# Patient Record
Sex: Female | Born: 1960
Health system: Southern US, Community
[De-identification: ages and names within clinical notes are randomized; demographics above are authoritative.]

## PROBLEM LIST (undated history)

## (undated) DIAGNOSIS — C4491 Basal cell carcinoma of skin, unspecified: Secondary | ICD-10-CM

## (undated) DIAGNOSIS — E063 Autoimmune thyroiditis: Secondary | ICD-10-CM

## (undated) DIAGNOSIS — K219 Gastro-esophageal reflux disease without esophagitis: Secondary | ICD-10-CM

## (undated) DIAGNOSIS — A0472 Enterocolitis due to Clostridium difficile, not specified as recurrent: Secondary | ICD-10-CM

## (undated) DIAGNOSIS — M545 Low back pain, unspecified: Secondary | ICD-10-CM

## (undated) DIAGNOSIS — F419 Anxiety disorder, unspecified: Secondary | ICD-10-CM

## (undated) DIAGNOSIS — K589 Irritable bowel syndrome without diarrhea: Secondary | ICD-10-CM

## (undated) DIAGNOSIS — Z8719 Personal history of other diseases of the digestive system: Secondary | ICD-10-CM

## (undated) DIAGNOSIS — B019 Varicella without complication: Secondary | ICD-10-CM

## (undated) DIAGNOSIS — Z0000 Encounter for general adult medical examination without abnormal findings: Secondary | ICD-10-CM

## (undated) DIAGNOSIS — H269 Unspecified cataract: Secondary | ICD-10-CM

## (undated) DIAGNOSIS — E039 Hypothyroidism, unspecified: Secondary | ICD-10-CM

## (undated) DIAGNOSIS — M1990 Unspecified osteoarthritis, unspecified site: Secondary | ICD-10-CM

## (undated) DIAGNOSIS — A4901 Methicillin susceptible Staphylococcus aureus infection, unspecified site: Secondary | ICD-10-CM

## (undated) DIAGNOSIS — K5790 Diverticulosis of intestine, part unspecified, without perforation or abscess without bleeding: Secondary | ICD-10-CM

## (undated) DIAGNOSIS — J019 Acute sinusitis, unspecified: Secondary | ICD-10-CM

## (undated) DIAGNOSIS — F319 Bipolar disorder, unspecified: Secondary | ICD-10-CM

## (undated) DIAGNOSIS — G4730 Sleep apnea, unspecified: Secondary | ICD-10-CM

## (undated) DIAGNOSIS — D649 Anemia, unspecified: Secondary | ICD-10-CM

## (undated) DIAGNOSIS — J189 Pneumonia, unspecified organism: Secondary | ICD-10-CM

## (undated) DIAGNOSIS — K8020 Calculus of gallbladder without cholecystitis without obstruction: Secondary | ICD-10-CM

## (undated) DIAGNOSIS — K635 Polyp of colon: Secondary | ICD-10-CM

## (undated) DIAGNOSIS — R011 Cardiac murmur, unspecified: Secondary | ICD-10-CM

## (undated) DIAGNOSIS — R51 Headache: Principal | ICD-10-CM

## (undated) DIAGNOSIS — H409 Unspecified glaucoma: Secondary | ICD-10-CM

## (undated) DIAGNOSIS — E785 Hyperlipidemia, unspecified: Secondary | ICD-10-CM

## (undated) DIAGNOSIS — Z8489 Family history of other specified conditions: Secondary | ICD-10-CM

## (undated) DIAGNOSIS — E669 Obesity, unspecified: Secondary | ICD-10-CM

## (undated) DIAGNOSIS — M25561 Pain in right knee: Secondary | ICD-10-CM

## (undated) DIAGNOSIS — N951 Menopausal and female climacteric states: Secondary | ICD-10-CM

## (undated) DIAGNOSIS — K279 Peptic ulcer, site unspecified, unspecified as acute or chronic, without hemorrhage or perforation: Secondary | ICD-10-CM

## (undated) DIAGNOSIS — Q134 Other congenital corneal malformations: Secondary | ICD-10-CM

## (undated) DIAGNOSIS — D126 Benign neoplasm of colon, unspecified: Secondary | ICD-10-CM

## (undated) DIAGNOSIS — Z720 Tobacco use: Secondary | ICD-10-CM

## (undated) DIAGNOSIS — L508 Other urticaria: Secondary | ICD-10-CM

## (undated) DIAGNOSIS — I10 Essential (primary) hypertension: Secondary | ICD-10-CM

## (undated) DIAGNOSIS — F329 Major depressive disorder, single episode, unspecified: Secondary | ICD-10-CM

## (undated) DIAGNOSIS — H35373 Puckering of macula, bilateral: Secondary | ICD-10-CM

## (undated) DIAGNOSIS — M9270 Juvenile osteochondrosis of metatarsus, unspecified foot: Secondary | ICD-10-CM

## (undated) DIAGNOSIS — J439 Emphysema, unspecified: Secondary | ICD-10-CM

## (undated) DIAGNOSIS — B269 Mumps without complication: Secondary | ICD-10-CM

## (undated) HISTORY — DX: Varicella without complication: B01.9

## (undated) HISTORY — DX: Unspecified cataract: H26.9

## (undated) HISTORY — DX: Unspecified glaucoma: H40.9

## (undated) HISTORY — DX: Menopausal and female climacteric states: N95.1

## (undated) HISTORY — DX: Juvenile osteochondrosis of metatarsus, unspecified foot: M92.70

## (undated) HISTORY — DX: Peptic ulcer, site unspecified, unspecified as acute or chronic, without hemorrhage or perforation: K27.9

## (undated) HISTORY — DX: Acute sinusitis, unspecified: J01.90

## (undated) HISTORY — DX: Major depressive disorder, single episode, unspecified: F32.9

## (undated) HISTORY — DX: Essential (primary) hypertension: I10

## (undated) HISTORY — DX: Methicillin susceptible Staphylococcus aureus infection, unspecified site: A49.01

## (undated) HISTORY — DX: Puckering of macula, bilateral: H35.373

## (undated) HISTORY — PX: ABDOMINAL HYSTERECTOMY: SUR658

## (undated) HISTORY — DX: Other congenital corneal malformations: Q13.4

## (undated) HISTORY — DX: Other urticaria: L50.8

## (undated) HISTORY — DX: Mumps without complication: B26.9

## (undated) HISTORY — DX: Bipolar disorder, unspecified: F31.9

## (undated) HISTORY — DX: Hyperlipidemia, unspecified: E78.5

## (undated) HISTORY — DX: Anemia, unspecified: D64.9

## (undated) HISTORY — DX: Tobacco use: Z72.0

## (undated) HISTORY — DX: Basal cell carcinoma of skin, unspecified: C44.91

## (undated) HISTORY — DX: Sleep apnea, unspecified: G47.30

## (undated) HISTORY — DX: Irritable bowel syndrome without diarrhea: K58.9

## (undated) HISTORY — PX: UTERINE SUSPENSION: SUR1430

## (undated) HISTORY — DX: Cardiac murmur, unspecified: R01.1

## (undated) HISTORY — DX: Headache: R51

## (undated) HISTORY — DX: Anxiety disorder, unspecified: F41.9

## (undated) HISTORY — PX: POLYPECTOMY: SHX149

## (undated) HISTORY — DX: Polyp of colon: K63.5

## (undated) HISTORY — DX: Emphysema, unspecified: J43.9

## (undated) HISTORY — DX: Encounter for general adult medical examination without abnormal findings: Z00.00

## (undated) HISTORY — DX: Low back pain: M54.5

## (undated) HISTORY — DX: Hypothyroidism, unspecified: E03.9

## (undated) HISTORY — DX: Pain in right knee: M25.561

## (undated) HISTORY — PX: COLONOSCOPY: SHX174

## (undated) HISTORY — DX: Benign neoplasm of colon, unspecified: D12.6

## (undated) HISTORY — DX: Low back pain, unspecified: M54.50

## (undated) HISTORY — DX: Gastro-esophageal reflux disease without esophagitis: K21.9

## (undated) HISTORY — DX: Unspecified osteoarthritis, unspecified site: M19.90

## (undated) HISTORY — DX: Obesity, unspecified: E66.9

## (undated) HISTORY — DX: Enterocolitis due to Clostridium difficile, not specified as recurrent: A04.72

## (undated) HISTORY — DX: Diverticulosis of intestine, part unspecified, without perforation or abscess without bleeding: K57.90

---

## 1983-01-16 HISTORY — PX: DILATION AND CURETTAGE OF UTERUS: SHX78

## 1993-01-15 HISTORY — PX: TUBAL LIGATION: SHX77

## 1997-07-30 ENCOUNTER — Other Ambulatory Visit: Admission: RE | Admit: 1997-07-30 | Discharge: 1997-07-30 | Payer: Self-pay | Admitting: *Deleted

## 1998-03-16 ENCOUNTER — Other Ambulatory Visit: Admission: RE | Admit: 1998-03-16 | Discharge: 1998-03-16 | Payer: Self-pay | Admitting: Obstetrics & Gynecology

## 1998-12-02 ENCOUNTER — Other Ambulatory Visit: Admission: RE | Admit: 1998-12-02 | Discharge: 1998-12-02 | Payer: Self-pay | Admitting: Obstetrics and Gynecology

## 2000-01-23 ENCOUNTER — Other Ambulatory Visit: Admission: RE | Admit: 2000-01-23 | Discharge: 2000-01-23 | Payer: Self-pay | Admitting: Obstetrics and Gynecology

## 2000-01-30 ENCOUNTER — Encounter: Admission: RE | Admit: 2000-01-30 | Discharge: 2000-01-30 | Payer: Self-pay | Admitting: Obstetrics and Gynecology

## 2000-01-30 ENCOUNTER — Encounter: Payer: Self-pay | Admitting: Obstetrics and Gynecology

## 2000-05-01 ENCOUNTER — Encounter: Admission: RE | Admit: 2000-05-01 | Discharge: 2000-07-30 | Payer: Self-pay | Admitting: Internal Medicine

## 2000-10-29 ENCOUNTER — Ambulatory Visit (HOSPITAL_COMMUNITY): Admission: RE | Admit: 2000-10-29 | Discharge: 2000-10-29 | Payer: Self-pay | Admitting: Internal Medicine

## 2000-10-29 ENCOUNTER — Encounter: Payer: Self-pay | Admitting: Internal Medicine

## 2000-12-26 ENCOUNTER — Other Ambulatory Visit: Admission: RE | Admit: 2000-12-26 | Discharge: 2000-12-26 | Payer: Self-pay | Admitting: Obstetrics and Gynecology

## 2001-02-10 ENCOUNTER — Encounter: Payer: Self-pay | Admitting: Obstetrics and Gynecology

## 2001-02-10 ENCOUNTER — Ambulatory Visit (HOSPITAL_COMMUNITY): Admission: RE | Admit: 2001-02-10 | Discharge: 2001-02-10 | Payer: Self-pay | Admitting: Obstetrics and Gynecology

## 2001-06-03 ENCOUNTER — Encounter: Payer: Self-pay | Admitting: Internal Medicine

## 2001-11-05 HISTORY — PX: OTHER SURGICAL HISTORY: SHX169

## 2001-12-29 ENCOUNTER — Other Ambulatory Visit: Admission: RE | Admit: 2001-12-29 | Discharge: 2001-12-29 | Payer: Self-pay | Admitting: Obstetrics and Gynecology

## 2003-12-08 ENCOUNTER — Ambulatory Visit: Payer: Self-pay | Admitting: Endocrinology

## 2003-12-28 ENCOUNTER — Other Ambulatory Visit: Admission: RE | Admit: 2003-12-28 | Discharge: 2003-12-28 | Payer: Self-pay | Admitting: Obstetrics and Gynecology

## 2004-01-24 ENCOUNTER — Ambulatory Visit: Payer: Self-pay | Admitting: Endocrinology

## 2004-01-28 ENCOUNTER — Ambulatory Visit: Payer: Self-pay | Admitting: Internal Medicine

## 2004-02-11 ENCOUNTER — Ambulatory Visit: Payer: Self-pay | Admitting: Internal Medicine

## 2004-03-31 ENCOUNTER — Ambulatory Visit (HOSPITAL_COMMUNITY): Admission: RE | Admit: 2004-03-31 | Discharge: 2004-03-31 | Payer: Self-pay | Admitting: Obstetrics and Gynecology

## 2004-03-31 ENCOUNTER — Encounter (INDEPENDENT_AMBULATORY_CARE_PROVIDER_SITE_OTHER): Payer: Self-pay | Admitting: *Deleted

## 2004-05-25 ENCOUNTER — Ambulatory Visit: Payer: Self-pay | Admitting: Endocrinology

## 2004-06-15 ENCOUNTER — Ambulatory Visit: Payer: Self-pay | Admitting: Endocrinology

## 2004-07-04 ENCOUNTER — Ambulatory Visit: Payer: Self-pay | Admitting: Endocrinology

## 2004-08-15 ENCOUNTER — Ambulatory Visit (HOSPITAL_BASED_OUTPATIENT_CLINIC_OR_DEPARTMENT_OTHER): Admission: RE | Admit: 2004-08-15 | Discharge: 2004-08-15 | Payer: Self-pay | Admitting: Endocrinology

## 2004-08-30 ENCOUNTER — Ambulatory Visit: Payer: Self-pay | Admitting: Pulmonary Disease

## 2004-09-26 ENCOUNTER — Ambulatory Visit: Payer: Self-pay | Admitting: Pulmonary Disease

## 2004-10-12 ENCOUNTER — Ambulatory Visit: Payer: Self-pay | Admitting: Pulmonary Disease

## 2005-06-27 ENCOUNTER — Ambulatory Visit: Payer: Self-pay | Admitting: Endocrinology

## 2005-07-02 ENCOUNTER — Ambulatory Visit: Payer: Self-pay | Admitting: Endocrinology

## 2005-07-23 ENCOUNTER — Ambulatory Visit: Payer: Self-pay

## 2005-09-04 ENCOUNTER — Ambulatory Visit: Payer: Self-pay | Admitting: Endocrinology

## 2005-09-24 ENCOUNTER — Ambulatory Visit (HOSPITAL_COMMUNITY): Admission: RE | Admit: 2005-09-24 | Discharge: 2005-09-24 | Payer: Self-pay | Admitting: Obstetrics and Gynecology

## 2005-10-03 ENCOUNTER — Encounter: Admission: RE | Admit: 2005-10-03 | Discharge: 2005-10-03 | Payer: Self-pay | Admitting: Obstetrics and Gynecology

## 2005-10-31 ENCOUNTER — Ambulatory Visit: Payer: Self-pay | Admitting: Endocrinology

## 2006-04-08 ENCOUNTER — Encounter: Admission: RE | Admit: 2006-04-08 | Discharge: 2006-04-08 | Payer: Self-pay | Admitting: Neurosurgery

## 2006-07-09 ENCOUNTER — Ambulatory Visit: Payer: Self-pay | Admitting: Endocrinology

## 2006-08-24 ENCOUNTER — Encounter: Payer: Self-pay | Admitting: Endocrinology

## 2006-08-24 DIAGNOSIS — J309 Allergic rhinitis, unspecified: Secondary | ICD-10-CM

## 2006-08-24 DIAGNOSIS — R32 Unspecified urinary incontinence: Secondary | ICD-10-CM | POA: Insufficient documentation

## 2006-08-24 DIAGNOSIS — E039 Hypothyroidism, unspecified: Secondary | ICD-10-CM | POA: Insufficient documentation

## 2006-08-24 DIAGNOSIS — E119 Type 2 diabetes mellitus without complications: Secondary | ICD-10-CM

## 2006-08-24 HISTORY — DX: Hypothyroidism, unspecified: E03.9

## 2006-09-26 ENCOUNTER — Ambulatory Visit: Payer: Self-pay | Admitting: Endocrinology

## 2006-09-26 LAB — CONVERTED CEMR LAB
ALT: 23 units/L (ref 0–35)
AST: 20 units/L (ref 0–37)
Alkaline Phosphatase: 54 units/L (ref 39–117)
BUN: 4 mg/dL — ABNORMAL LOW (ref 6–23)
Basophils Relative: 0.8 % (ref 0.0–1.0)
Bilirubin, Direct: 0.1 mg/dL (ref 0.0–0.3)
CO2: 30 meq/L (ref 19–32)
Calcium: 9 mg/dL (ref 8.4–10.5)
Chloride: 105 meq/L (ref 96–112)
Creatinine, Ser: 0.8 mg/dL (ref 0.4–1.2)
Crystals: NEGATIVE
Eosinophils Absolute: 0.1 10*3/uL (ref 0.0–0.6)
Eosinophils Relative: 1.8 % (ref 0.0–5.0)
Glucose, Bld: 98 mg/dL (ref 70–99)
HCT: 38 % (ref 36.0–46.0)
Ketones, ur: NEGATIVE mg/dL
Lymphocytes Relative: 29.9 % (ref 12.0–46.0)
MCV: 91.2 fL (ref 78.0–100.0)
Neutrophils Relative %: 60 % (ref 43.0–77.0)
RBC: 4.16 M/uL (ref 3.87–5.11)
Total Protein, Urine: NEGATIVE mg/dL
Total Protein: 6.8 g/dL (ref 6.0–8.3)
Triglycerides: 216 mg/dL (ref 0–149)
WBC: 6 10*3/uL (ref 4.5–10.5)

## 2006-10-03 ENCOUNTER — Ambulatory Visit: Payer: Self-pay | Admitting: Endocrinology

## 2006-10-04 ENCOUNTER — Ambulatory Visit: Payer: Self-pay | Admitting: Cardiology

## 2006-10-07 ENCOUNTER — Encounter: Admission: RE | Admit: 2006-10-07 | Discharge: 2006-10-07 | Payer: Self-pay | Admitting: Obstetrics and Gynecology

## 2006-10-11 ENCOUNTER — Ambulatory Visit: Payer: Self-pay

## 2006-10-31 ENCOUNTER — Ambulatory Visit: Payer: Self-pay | Admitting: Endocrinology

## 2006-11-01 ENCOUNTER — Encounter: Payer: Self-pay | Admitting: Endocrinology

## 2006-12-03 ENCOUNTER — Telehealth (INDEPENDENT_AMBULATORY_CARE_PROVIDER_SITE_OTHER): Payer: Self-pay | Admitting: *Deleted

## 2006-12-07 ENCOUNTER — Encounter: Payer: Self-pay | Admitting: Internal Medicine

## 2006-12-09 ENCOUNTER — Telehealth (INDEPENDENT_AMBULATORY_CARE_PROVIDER_SITE_OTHER): Payer: Self-pay | Admitting: *Deleted

## 2006-12-10 ENCOUNTER — Ambulatory Visit: Payer: Self-pay | Admitting: Endocrinology

## 2006-12-11 ENCOUNTER — Encounter: Payer: Self-pay | Admitting: Endocrinology

## 2006-12-13 ENCOUNTER — Ambulatory Visit: Payer: Self-pay | Admitting: Endocrinology

## 2007-01-17 ENCOUNTER — Ambulatory Visit: Payer: Self-pay | Admitting: Endocrinology

## 2007-01-17 ENCOUNTER — Encounter (INDEPENDENT_AMBULATORY_CARE_PROVIDER_SITE_OTHER): Payer: Self-pay | Admitting: *Deleted

## 2007-01-17 DIAGNOSIS — F319 Bipolar disorder, unspecified: Secondary | ICD-10-CM

## 2007-01-17 DIAGNOSIS — F32A Depression, unspecified: Secondary | ICD-10-CM

## 2007-01-17 DIAGNOSIS — R112 Nausea with vomiting, unspecified: Secondary | ICD-10-CM

## 2007-01-17 HISTORY — DX: Bipolar disorder, unspecified: F31.9

## 2007-01-17 HISTORY — DX: Depression, unspecified: F32.A

## 2007-01-20 LAB — CONVERTED CEMR LAB
Alkaline Phosphatase: 55 units/L (ref 39–117)
Amylase: 53 units/L (ref 27–131)
BUN: 2 mg/dL — ABNORMAL LOW (ref 6–23)
Basophils Absolute: 0.3 10*3/uL — ABNORMAL HIGH (ref 0.0–0.1)
Basophils Relative: 4.1 % — ABNORMAL HIGH (ref 0.0–1.0)
CO2: 30 meq/L (ref 19–32)
Creatinine, Ser: 0.6 mg/dL (ref 0.4–1.2)
GFR calc Af Amer: 138 mL/min
HCT: 37.6 % (ref 36.0–46.0)
Hemoglobin: 13.1 g/dL (ref 12.0–15.0)
Lymphocytes Relative: 29.7 % (ref 12.0–46.0)
MCHC: 34.8 g/dL (ref 30.0–36.0)
Monocytes Absolute: 0.5 10*3/uL (ref 0.2–0.7)
Monocytes Relative: 6.6 % (ref 3.0–11.0)
Neutro Abs: 4 10*3/uL (ref 1.4–7.7)
Neutrophils Relative %: 58.4 % (ref 43.0–77.0)
Potassium: 4.1 meq/L (ref 3.5–5.1)
RDW: 12.2 % (ref 11.5–14.6)
Sodium: 139 meq/L (ref 135–145)
TSH: 0.58 microintl units/mL (ref 0.35–5.50)
Total Bilirubin: 0.5 mg/dL (ref 0.3–1.2)
Total Protein: 7 g/dL (ref 6.0–8.3)

## 2007-02-11 ENCOUNTER — Ambulatory Visit: Payer: Self-pay | Admitting: Internal Medicine

## 2007-02-14 ENCOUNTER — Ambulatory Visit (HOSPITAL_COMMUNITY): Admission: RE | Admit: 2007-02-14 | Discharge: 2007-02-14 | Payer: Self-pay | Admitting: Internal Medicine

## 2007-02-26 ENCOUNTER — Emergency Department (HOSPITAL_COMMUNITY): Admission: EM | Admit: 2007-02-26 | Discharge: 2007-02-26 | Payer: Self-pay | Admitting: Emergency Medicine

## 2007-03-05 ENCOUNTER — Encounter: Payer: Self-pay | Admitting: Endocrinology

## 2007-06-30 ENCOUNTER — Encounter: Payer: Self-pay | Admitting: Endocrinology

## 2007-07-27 ENCOUNTER — Emergency Department (HOSPITAL_COMMUNITY): Admission: EM | Admit: 2007-07-27 | Discharge: 2007-07-27 | Payer: Self-pay | Admitting: Family Medicine

## 2007-10-10 ENCOUNTER — Encounter: Admission: RE | Admit: 2007-10-10 | Discharge: 2007-10-10 | Payer: Self-pay | Admitting: Obstetrics and Gynecology

## 2007-10-17 ENCOUNTER — Encounter: Admission: RE | Admit: 2007-10-17 | Discharge: 2007-10-17 | Payer: Self-pay | Admitting: Cardiovascular Disease

## 2007-12-17 ENCOUNTER — Encounter: Payer: Self-pay | Admitting: Endocrinology

## 2008-01-16 LAB — HM COLONOSCOPY

## 2008-06-23 ENCOUNTER — Observation Stay (HOSPITAL_COMMUNITY): Admission: AD | Admit: 2008-06-23 | Discharge: 2008-06-24 | Payer: Self-pay | Admitting: Cardiovascular Disease

## 2008-07-15 ENCOUNTER — Encounter (INDEPENDENT_AMBULATORY_CARE_PROVIDER_SITE_OTHER): Payer: Self-pay | Admitting: *Deleted

## 2008-07-16 ENCOUNTER — Observation Stay (HOSPITAL_COMMUNITY): Admission: EM | Admit: 2008-07-16 | Discharge: 2008-07-16 | Payer: Self-pay | Admitting: Emergency Medicine

## 2008-07-16 ENCOUNTER — Encounter: Payer: Self-pay | Admitting: Internal Medicine

## 2008-07-27 ENCOUNTER — Ambulatory Visit: Payer: Self-pay | Admitting: Internal Medicine

## 2008-07-27 DIAGNOSIS — R079 Chest pain, unspecified: Secondary | ICD-10-CM

## 2008-07-27 DIAGNOSIS — Z860100 Personal history of colon polyps, unspecified: Secondary | ICD-10-CM | POA: Insufficient documentation

## 2008-07-27 DIAGNOSIS — K219 Gastro-esophageal reflux disease without esophagitis: Secondary | ICD-10-CM | POA: Insufficient documentation

## 2008-07-28 ENCOUNTER — Encounter: Admission: RE | Admit: 2008-07-28 | Discharge: 2008-07-28 | Payer: Self-pay | Admitting: Internal Medicine

## 2008-07-28 ENCOUNTER — Encounter (INDEPENDENT_AMBULATORY_CARE_PROVIDER_SITE_OTHER): Payer: Self-pay | Admitting: *Deleted

## 2008-08-05 ENCOUNTER — Encounter: Payer: Self-pay | Admitting: Internal Medicine

## 2008-08-05 ENCOUNTER — Ambulatory Visit: Payer: Self-pay | Admitting: Internal Medicine

## 2008-08-09 ENCOUNTER — Encounter: Payer: Self-pay | Admitting: Internal Medicine

## 2008-08-12 ENCOUNTER — Telehealth (INDEPENDENT_AMBULATORY_CARE_PROVIDER_SITE_OTHER): Payer: Self-pay | Admitting: *Deleted

## 2008-08-12 ENCOUNTER — Encounter: Payer: Self-pay | Admitting: Internal Medicine

## 2008-09-01 ENCOUNTER — Telehealth: Payer: Self-pay | Admitting: Internal Medicine

## 2008-09-02 ENCOUNTER — Emergency Department (HOSPITAL_COMMUNITY): Admission: EM | Admit: 2008-09-02 | Discharge: 2008-09-02 | Payer: Self-pay | Admitting: Emergency Medicine

## 2008-09-02 ENCOUNTER — Telehealth: Payer: Self-pay | Admitting: Internal Medicine

## 2008-09-02 ENCOUNTER — Encounter (INDEPENDENT_AMBULATORY_CARE_PROVIDER_SITE_OTHER): Payer: Self-pay | Admitting: *Deleted

## 2008-09-03 ENCOUNTER — Telehealth: Payer: Self-pay | Admitting: Internal Medicine

## 2008-09-06 ENCOUNTER — Ambulatory Visit: Payer: Self-pay | Admitting: Internal Medicine

## 2008-09-06 DIAGNOSIS — R197 Diarrhea, unspecified: Secondary | ICD-10-CM

## 2008-09-06 DIAGNOSIS — K5730 Diverticulosis of large intestine without perforation or abscess without bleeding: Secondary | ICD-10-CM | POA: Insufficient documentation

## 2008-09-06 DIAGNOSIS — Z8719 Personal history of other diseases of the digestive system: Secondary | ICD-10-CM | POA: Insufficient documentation

## 2008-09-07 ENCOUNTER — Encounter: Payer: Self-pay | Admitting: Nurse Practitioner

## 2008-09-13 ENCOUNTER — Telehealth: Payer: Self-pay | Admitting: Internal Medicine

## 2008-10-11 ENCOUNTER — Ambulatory Visit: Payer: Self-pay | Admitting: Internal Medicine

## 2008-10-15 ENCOUNTER — Encounter: Admission: RE | Admit: 2008-10-15 | Discharge: 2008-10-15 | Payer: Self-pay | Admitting: Family Medicine

## 2008-10-15 ENCOUNTER — Encounter (INDEPENDENT_AMBULATORY_CARE_PROVIDER_SITE_OTHER): Payer: Self-pay | Admitting: *Deleted

## 2008-10-25 ENCOUNTER — Encounter: Payer: Self-pay | Admitting: Endocrinology

## 2008-11-04 ENCOUNTER — Encounter: Admission: RE | Admit: 2008-11-04 | Discharge: 2008-11-04 | Payer: Self-pay | Admitting: Neurosurgery

## 2008-11-05 ENCOUNTER — Encounter: Admission: RE | Admit: 2008-11-05 | Discharge: 2008-11-05 | Payer: Self-pay | Admitting: Obstetrics and Gynecology

## 2008-11-10 ENCOUNTER — Encounter: Payer: Self-pay | Admitting: Endocrinology

## 2008-12-17 ENCOUNTER — Emergency Department (HOSPITAL_BASED_OUTPATIENT_CLINIC_OR_DEPARTMENT_OTHER): Admission: EM | Admit: 2008-12-17 | Discharge: 2008-12-17 | Payer: Self-pay | Admitting: Emergency Medicine

## 2009-01-15 HISTORY — PX: ABDOMINAL HYSTERECTOMY: SHX81

## 2009-01-21 ENCOUNTER — Encounter (INDEPENDENT_AMBULATORY_CARE_PROVIDER_SITE_OTHER): Payer: Self-pay | Admitting: *Deleted

## 2009-01-28 ENCOUNTER — Telehealth: Payer: Self-pay | Admitting: Internal Medicine

## 2009-02-02 ENCOUNTER — Encounter (INDEPENDENT_AMBULATORY_CARE_PROVIDER_SITE_OTHER): Payer: Self-pay | Admitting: *Deleted

## 2009-02-04 ENCOUNTER — Ambulatory Visit: Payer: Self-pay | Admitting: Internal Medicine

## 2009-02-08 ENCOUNTER — Encounter (INDEPENDENT_AMBULATORY_CARE_PROVIDER_SITE_OTHER): Payer: Self-pay | Admitting: *Deleted

## 2009-02-23 ENCOUNTER — Encounter (INDEPENDENT_AMBULATORY_CARE_PROVIDER_SITE_OTHER): Payer: Self-pay | Admitting: *Deleted

## 2009-02-25 ENCOUNTER — Telehealth: Payer: Self-pay | Admitting: Internal Medicine

## 2009-03-18 ENCOUNTER — Telehealth (INDEPENDENT_AMBULATORY_CARE_PROVIDER_SITE_OTHER): Payer: Self-pay | Admitting: *Deleted

## 2009-05-03 ENCOUNTER — Encounter (INDEPENDENT_AMBULATORY_CARE_PROVIDER_SITE_OTHER): Payer: Self-pay | Admitting: *Deleted

## 2009-05-04 ENCOUNTER — Ambulatory Visit: Payer: Self-pay | Admitting: Internal Medicine

## 2009-05-06 ENCOUNTER — Encounter: Admission: RE | Admit: 2009-05-06 | Discharge: 2009-05-06 | Payer: Self-pay | Admitting: Obstetrics and Gynecology

## 2009-05-13 ENCOUNTER — Ambulatory Visit: Payer: Self-pay | Admitting: Internal Medicine

## 2009-05-17 ENCOUNTER — Encounter: Payer: Self-pay | Admitting: Internal Medicine

## 2009-05-25 ENCOUNTER — Telehealth: Payer: Self-pay | Admitting: Gastroenterology

## 2009-05-30 ENCOUNTER — Telehealth: Payer: Self-pay | Admitting: Internal Medicine

## 2009-06-09 ENCOUNTER — Encounter: Payer: Self-pay | Admitting: Physician Assistant

## 2009-06-09 ENCOUNTER — Ambulatory Visit: Payer: Self-pay | Admitting: Internal Medicine

## 2009-06-09 ENCOUNTER — Telehealth (INDEPENDENT_AMBULATORY_CARE_PROVIDER_SITE_OTHER): Payer: Self-pay | Admitting: *Deleted

## 2009-06-09 ENCOUNTER — Telehealth: Payer: Self-pay | Admitting: Internal Medicine

## 2009-06-09 DIAGNOSIS — E785 Hyperlipidemia, unspecified: Secondary | ICD-10-CM | POA: Insufficient documentation

## 2009-06-09 DIAGNOSIS — R197 Diarrhea, unspecified: Secondary | ICD-10-CM

## 2009-06-09 DIAGNOSIS — E782 Mixed hyperlipidemia: Secondary | ICD-10-CM

## 2009-06-09 DIAGNOSIS — I10 Essential (primary) hypertension: Secondary | ICD-10-CM | POA: Insufficient documentation

## 2009-06-09 DIAGNOSIS — R1084 Generalized abdominal pain: Secondary | ICD-10-CM

## 2009-06-09 LAB — CONVERTED CEMR LAB: Tissue Transglutaminase Ab, IgA: 4.7 units (ref <20)

## 2009-06-10 ENCOUNTER — Encounter: Payer: Self-pay | Admitting: Physician Assistant

## 2009-06-14 ENCOUNTER — Encounter: Payer: Self-pay | Admitting: Physician Assistant

## 2009-06-14 ENCOUNTER — Telehealth: Payer: Self-pay | Admitting: Physician Assistant

## 2009-06-15 ENCOUNTER — Telehealth: Payer: Self-pay | Admitting: Physician Assistant

## 2009-06-16 ENCOUNTER — Telehealth: Payer: Self-pay | Admitting: Physician Assistant

## 2009-06-17 ENCOUNTER — Encounter: Payer: Self-pay | Admitting: Physician Assistant

## 2009-06-21 ENCOUNTER — Ambulatory Visit: Payer: Self-pay | Admitting: Internal Medicine

## 2009-06-22 ENCOUNTER — Telehealth: Payer: Self-pay | Admitting: Internal Medicine

## 2009-06-22 DIAGNOSIS — A0472 Enterocolitis due to Clostridium difficile, not specified as recurrent: Secondary | ICD-10-CM

## 2009-06-23 ENCOUNTER — Telehealth: Payer: Self-pay | Admitting: Internal Medicine

## 2009-06-24 ENCOUNTER — Telehealth: Payer: Self-pay | Admitting: Internal Medicine

## 2009-06-24 ENCOUNTER — Encounter: Payer: Self-pay | Admitting: Internal Medicine

## 2009-06-28 ENCOUNTER — Telehealth: Payer: Self-pay | Admitting: Internal Medicine

## 2009-07-14 ENCOUNTER — Ambulatory Visit: Payer: Self-pay | Admitting: Gastroenterology

## 2009-07-14 ENCOUNTER — Encounter: Payer: Self-pay | Admitting: Physician Assistant

## 2009-07-14 ENCOUNTER — Telehealth: Payer: Self-pay | Admitting: Internal Medicine

## 2009-07-14 DIAGNOSIS — R1032 Left lower quadrant pain: Secondary | ICD-10-CM

## 2009-07-14 LAB — CONVERTED CEMR LAB
Basophils Absolute: 0 10*3/uL (ref 0.0–0.1)
CO2: 28 meq/L (ref 19–32)
Chloride: 102 meq/L (ref 96–112)
Creatinine, Ser: 0.8 mg/dL (ref 0.4–1.2)
Eosinophils Absolute: 0.1 10*3/uL (ref 0.0–0.7)
Lymphocytes Relative: 23.7 % (ref 12.0–46.0)
MCHC: 34.6 g/dL (ref 30.0–36.0)
Monocytes Relative: 9.2 % (ref 3.0–12.0)
Neutrophils Relative %: 66 % (ref 43.0–77.0)
Platelets: 189 10*3/uL (ref 150.0–400.0)
Potassium: 3.6 meq/L (ref 3.5–5.1)
RDW: 12.6 % (ref 11.5–14.6)
Sodium: 136 meq/L (ref 135–145)

## 2009-07-15 ENCOUNTER — Telehealth (INDEPENDENT_AMBULATORY_CARE_PROVIDER_SITE_OTHER): Payer: Self-pay | Admitting: *Deleted

## 2009-07-15 ENCOUNTER — Encounter: Payer: Self-pay | Admitting: Physician Assistant

## 2009-07-15 ENCOUNTER — Ambulatory Visit: Payer: Self-pay | Admitting: Internal Medicine

## 2009-07-19 ENCOUNTER — Telehealth: Payer: Self-pay | Admitting: Internal Medicine

## 2009-07-25 ENCOUNTER — Ambulatory Visit: Payer: Self-pay | Admitting: Internal Medicine

## 2009-07-25 DIAGNOSIS — K5732 Diverticulitis of large intestine without perforation or abscess without bleeding: Secondary | ICD-10-CM

## 2009-10-06 ENCOUNTER — Ambulatory Visit (HOSPITAL_COMMUNITY): Admission: RE | Admit: 2009-10-06 | Discharge: 2009-10-07 | Payer: Self-pay | Admitting: Obstetrics and Gynecology

## 2009-10-06 ENCOUNTER — Encounter (INDEPENDENT_AMBULATORY_CARE_PROVIDER_SITE_OTHER): Payer: Self-pay | Admitting: Obstetrics and Gynecology

## 2009-11-07 ENCOUNTER — Encounter: Admission: RE | Admit: 2009-11-07 | Discharge: 2009-11-07 | Payer: Self-pay | Admitting: Obstetrics and Gynecology

## 2009-12-16 ENCOUNTER — Emergency Department (HOSPITAL_BASED_OUTPATIENT_CLINIC_OR_DEPARTMENT_OTHER): Admission: EM | Admit: 2009-12-16 | Discharge: 2009-12-16 | Payer: Self-pay | Admitting: Emergency Medicine

## 2010-02-06 ENCOUNTER — Encounter: Payer: Self-pay | Admitting: Internal Medicine

## 2010-02-14 NOTE — Progress Notes (Signed)
Summary: Vancomycin prior auth   Phone Note Call from Patient Call back at [facility] Phone [phone]   Call For: Dr Marina Goodell Summary of Call: Status of Vancomycin prior auth. Pharmacy says insurance  is still refusing it becuause they are waiting to hear back from office.  Will be out of pills tomorrow and was told she should not stop taking these. Initial call taken by: Leanor Kail [facility] [address],  June 23, 2009 12:26 PM  Follow-up for Phone Call        PA done and it was approved.  Called patient and she picked up her meds on Friday 6/10.   Follow-up by: Milford Cage NCMA,  June 27, 2009 9:32 AM

## 2010-02-14 NOTE — Progress Notes (Signed)
Summary: Is she contagious?   Phone Note Call from Patient Call back at [facility] Phone [phone]   Call For: Dr Marina Goodell Summary of Call: Was seen yesterday - Has no refills on Vancomycin. Also wants to make sure that she is not contagious still has a fever. Initial call taken by: Leanor Kail [facility],  June 22, 2009 8:06 AM  Follow-up for Phone Call        left message on machine to call back Chales Abrahams CMA [facility])  June 22, 2009 8:13 AM   Pt is not contagious and she is aware that refills on vanc have been sent to the pharmacy.  she will call with any further questions or concerns. Follow-up by: Chales Abrahams CMA Duncan Dull),  June 22, 2009 8:27 AM

## 2010-02-14 NOTE — Progress Notes (Signed)
Summary: Diverticulitis infection   Phone Note Call from Patient Call back at [facility] Phone [phone]   Call For: Dr Marina Goodell Reason for Call: Talk to Nurse Summary of Call: Diverticulitis infection is being treated with antibiotics. Wonders what foods she should stay away from. Initial call taken by: Leanor Kail [facility],  May 30, 2009 9:55 AM  Follow-up for Phone Call        Pt. instructed to get on internet for a low fiber,low residue diet and told to remain on it until she sees Dr.Tyannah Sane on 6/7/11Also mailed pt.pamphlet fo  this diet and for for high fiber diet with instructions on when to use each one. Follow-up by: Teryl Lucy RN,  May 30, 2009 10:43 AM

## 2010-02-14 NOTE — Letter (Signed)
Summary: [facility] Instructions  [facility]  [address] [place], [place] [address]   Phone: [phone]  Fax: [phone]       Joan Robinson    05/10/1960    MRN: [id number]        Procedure Day /Date:  Friday 02/11/2009     Arrival Time:  9:30 am      Procedure Time:  10:30 am     Location of Procedure:                    _ x_  [facility] (4th Floor)   PREPARATION FOR COLONOSCOPY WITH MOVIPREP   Starting 5 days prior to your procedure Sunday 1/23 do not eat nuts, seeds, popcorn, corn, beans, peas,  salads, or any raw vegetables.  Do not take any fiber supplements (e.g. Metamucil, Citrucel, and Benefiber).  THE DAY BEFORE YOUR PROCEDURE         DATE: Thursday 1/27  1.  Drink clear liquids the entire day-NO SOLID FOOD  2.  Do not drink anything colored red or purple.  Avoid juices with pulp.  No orange juice.  3.  Drink at least 64 oz. (8 glasses) of fluid/clear liquids during the day to prevent dehydration and help the prep work efficiently.  CLEAR LIQUIDS INCLUDE: Water Jello Ice Popsicles Tea (sugar ok, no milk/cream) Powdered fruit flavored drinks Coffee (sugar ok, no milk/cream) Gatorade Juice: apple, white grape, white cranberry  Lemonade Clear bullion, consomm, broth Carbonated beverages (any kind) Strained chicken noodle soup Hard Candy                             4.  In the morning, mix first dose of MoviPrep solution:    Empty 1 Pouch A and 1 Pouch B into the disposable container    Add lukewarm drinking water to the top line of the container. Mix to dissolve    Refrigerate (mixed solution should be used within 24 hrs)  5.  Begin drinking the prep at 5:00 p.m. The MoviPrep container is divided by 4 marks.   Every 15 minutes drink the solution down to the next mark (approximately 8 oz) until the full liter is complete.   6.  Follow completed prep with 16 oz of clear liquid of your choice (Nothing red or purple).   Continue to drink clear liquids until bedtime.  7.  Before going to bed, mix second dose of MoviPrep solution:    Empty 1 Pouch A and 1 Pouch B into the disposable container    Add lukewarm drinking water to the top line of the container. Mix to dissolve    Refrigerate  THE DAY OF YOUR PROCEDURE      DATE: Friday 1/28  Beginning at 5:30 am (5 hours before procedure):         1. Every 15 minutes, drink the solution down to the next mark (approx 8 oz) until the full liter is complete.  2. Follow completed prep with 16 oz. of clear liquid of your choice.    3. You may drink clear liquids until 8:30 am (2 HOURS BEFORE PROCEDURE).   MEDICATION INSTRUCTIONS  Unless otherwise instructed, you should take regular prescription medications with a small sip of water   as early as possible the morning of your procedure.          OTHER INSTRUCTIONS  You will need a responsible  adult at least 50 years of age to accompany you and drive you home.   This person must remain in the waiting room during your procedure.  Wear loose fitting clothing that is easily removed.  Leave jewelry and other valuables at home.  However, you may wish to bring a book to read or  an iPod/MP3 player to listen to music as you wait for your procedure to start.  Remove all body piercing jewelry and leave at home.  Total time from sign-in until discharge is approximately 2-3 hours.  You should go home directly after your procedure and rest.  You can resume normal activities the  day after your procedure.  The day of your procedure you should not:   Drive   Make legal decisions   Operate machinery   Drink alcohol   Return to work  You will receive specific instructions about eating, activities and medications before you leave.    The above instructions have been reviewed and explained to me by   Ezra Sites RN  February 04, 2009 1:33 PM     I fully understand and can verbalize these  instructions _____________________________ Date _________

## 2010-02-14 NOTE — Progress Notes (Signed)
Summary: Records request from [facility]  Request for records received from [facility]. Request forwarded to [employer]. Dena Chavis  March 18, 2009 4:02 PM

## 2010-02-14 NOTE — Letter (Signed)
Summary: Out of Work  [facility]  [address] [place], [place] [address]   Phone: [phone]  Fax: [phone]                July 14, 2009   Employee:  HALEE GLYNN Welte    To Whom It May Concern:   For Medical reasons, please excuse the above named employee from work for the following dates:  Start:   07/14/09   - 07/15/09   End:   07/16/09  If you need additional information, please feel free to contact our office.         Sincerely,    Amy Esterwood, PA-C

## 2010-02-14 NOTE — Miscellaneous (Signed)
Summary: previsit  Clinical Lists Changes  Observations: Added new observation of ALLERGY REV: Done (05/04/2009 16:21)

## 2010-02-14 NOTE — Progress Notes (Signed)
Summary: Triage / DIARRHEA   Phone Note Call from Patient Call back at Home Phone [phone]   Caller: Patient Call For: Dr. Marina Goodell Reason for Call: Talk to Nurse Summary of Call: Pt. has diarrhea. She is very weak. She has an appt. w/her MD today. Initial call taken by: Karna Christmas,  February 25, 2009 9:52 AM  Follow-up for Phone Call         Pt. has taken otc Vear Clock probiotics x28 days with some improvement in loose stools but continues with them with less than 5  a day up to 10 a day.No blood or mucus seen. Has some interrmittent nausea. Has appt. today with new PCP-Dr.Massiah Minjares Moore in [place]. Follow-up by: Teryl Lucy RN,  February 25, 2009 10:23 AM  Additional Follow-up for Phone Call Additional follow up Details #1::        LET HER PCP SEE HER AND WORK THINGS UP. IF WE CAN HELP, WE ARE AVAILABLE. SHE CAN SEE ME OR AN EXTENDER NEXT WEEK IF NEEDED. Additional Follow-up by: Hilarie Fredrickson MD,  February 25, 2009 10:43 AM    Additional Follow-up for Phone Call Additional follow up Details #2::    Message left for pt. .Given Dr.Lyric Rossano's recommendations and she will keep appt. withPCP. today. Follow-up by: Teryl Lucy RN,  February 25, 2009 11:41 AM

## 2010-02-14 NOTE — Progress Notes (Signed)
   Phone Note From Other Clinic   Caller: Provider Call For: Dr. Yancey Flemings Summary of Call: Dr. Candie Echevaria called for Dr. Marina Goodell to discuss pt. management. I took the call in his absence as DOD. Colonoscopy performed on 05/13/09. Pt. diagnosed with diverticulitis by symptoms and CT scan with symptoms starting 2-3 days ago. Placed on Augmentin due to intolerances to cipro, flagyl. Not usually a complication of colonoscopy. Plans to follow up in 2 weeks with Dr. Christell Constant is planned. Management appropriate. We want to see her back if she fails to improve or relapses. Initial call taken by: Meryl Dare MD Clementeen Graham,  May 25, 2009 11:49 AM

## 2010-02-14 NOTE — Assessment & Plan Note (Signed)
Summary: F/U APPT...    History of Present Illness Visit Type: Follow-up Visit Primary GI MD: Yancey Flemings MD Primary Provider: Kirke Corin, DO  Requesting Provider: na Chief Complaint: Abdominal pain and diarrhea has improved History of Present Illness:   Joan Robinson presents today for followup. She is a 50 year old with hypertension, hyperlipidemia, hypothyroidism, anxiety, and chronic back pain. She is followed in this office for GERD. Recent problems have included a bout of diverticulitis followed by clostridium difficile associated diarrhea. Last office visit June 30 with complaints of left lower quadrant pain nausea, vomiting, and fever. Laboratories were unremarkable. CT Scan negative for recurrent diverticulitis. Stool for C. difficile negative. Vancomycin called in but not use. She spontaneously improved and has been doing well over the past week. She still concerned about the recurrent GI symptoms. She also reports needing to have a hysterectomy done this fall for uterine fibroids and ovarian cysts. She does mention occasional soreness and left lower quadrant. Bowels are okay right now. No bleeding. Last colonoscopy a few months ago was okay. Currently on probiotic.   GI Review of Systems    Reports abdominal pain.     Location of  Abdominal pain: LLQ.    Denies acid reflux, belching, bloating, chest pain, dysphagia with liquids, dysphagia with solids, heartburn, loss of appetite, nausea, vomiting, vomiting blood, weight loss, and  weight gain.      Reports diarrhea.     Denies anal fissure, black tarry stools, change in bowel habit, constipation, diverticulosis, fecal incontinence, heme positive stool, hemorrhoids, irritable bowel syndrome, jaundice, light color stool, liver problems, rectal bleeding, and  rectal pain.    Current Medications (verified): 1)  Synthroid 100 Mcg  Tabs (Levothyroxine Sodium) .... Take 1 By Mouth Qd 2)  Vitamin B-12 1000 Mcg Tabs (Cyanocobalamin) .... Take 1  Tablet Once Daily---On Hold 3)  Cardizem 30 Mg Tabs (Diltiazem Hcl) .... Take 1 Tablet By Mouth Two Times A Day 4)  Metoprolol Tartrate 25 Mg Tabs (Metoprolol Tartrate) .... Take 1/2 Tablet By Mouth Once Daily 5)  Nitrostat 0.4 Mg Subl (Nitroglycerin) .... As Needed 6)  Tums E-X 750 750 Mg Chew (Calcium Carbonate Antacid) .... Take 2 Tablets By Mouth Once Daily 7)  Xanax 0.25 Mg Tabs (Alprazolam) .... Take 1 Tab By Mouth At Bedtime As Needed 8)  Zofran 4 Mg Tabs (Ondansetron Hcl) .Marland Kitchen.. 1 By Mouth Every 6 Hours As Needed 9)  Sm Coral Calcium 1000 (390 Ca) Mg Tabs (Coral Calcium) .... Take 1 Tablet By Mouth Once A Day 10)  Crestor 10 Mg Tabs (Rosuvastatin Calcium) .... Take 1 Tablet By Mouth Twice Per Week 11)  Hydrocodone-Acetaminophen 10-325 Mg Tabs (Hydrocodone-Acetaminophen) .... Take 1 Tablet Every 6 Hours As Needed Pain  Allergies (verified): 1)  ! Nsaids 2)  ! Lortab 3)  ! * Cetacaine Spray 4)  ! Bentyl 5)  Asa 6)  Avelox 7)  Septra 8)  Flagyl  Past History:  Past Medical History: Reviewed history from 07/14/2009 and no changes required. Allergic rhinitis ADENOMATOUS COLON POLYPS DIVERTICULOSIS/HX DIVERTICULITIS C.DIFF COLITIS 5/11,6/11 Hypothyroidism Urinary incontinence Hx. PUD (remote) Tobacco Abuse HTN HYPERLIPIDEMIA  Past Surgical History: Reviewed history from 06/09/2009 and no changes required. Tubal ligation (1995) Gated Spect Wall Motion Stress Cardiolite (11/05/2001) Lower Arterial (07/23/2005) EKG (02/01/2005 Uterine Mesh COLONOSCOPY-LAST 05/13/09,DIVERTICULOSIS, AND SMALL ADENOMATOUS POLYP  Family History: Reviewed history from 07/27/2008 and no changes required. Family History of Colon Cancer: Pat Uncle Family History of Colon Polyps: Father Family History of  Heart Disease: Mother Family History of Irritable Bowel Syndrome:  Daughter  Social History: Reviewed history from 06/09/2009 and no changes required. Married, 1 boy, 1 girl Accts  Receivable Alcohol Use - no Daily Caffeine Use 2 cups coffee Illicit Drug Use - no Patient does not get regular exercise.  Patient is a former smoker. -stopped early May 2011  Review of Systems       The patient complains of allergy/sinus, anemia, anxiety-new, arthritis/joint pain, back pain, change in vision, fatigue, fever, headaches-new, heart murmur, muscle pains/cramps, night sweats, swelling of feet/legs, thirst - excessive, and urine leakage.  The patient denies blood in urine, breast changes/lumps, confusion, cough, coughing up blood, depression-new, fainting, hearing problems, heart rhythm changes, itching, menstrual pain, nosebleeds, pregnancy symptoms, shortness of breath, skin rash, sleeping problems, sore throat, swollen lymph glands, thirst - excessive , urination - excessive , urination changes/pain, vision changes, and voice change.    Vital Signs:  Patient profile:   50 year old female Height:      67.5 inches Weight:      173.25 pounds BMI:     26.83 Temp:     98.8 degrees F oral Pulse rate:   76 / minute Pulse rhythm:   regular BP sitting:   132 / 80  (left arm) Cuff size:   regular  Vitals Entered By: June McMurray CMA Duncan Dull) (July 25, 2009 3:51 PM)  Physical Exam  General:  Well developed, well nourished, no acute distress. Head:  Normocephalic and atraumatic. Eyes:  anicteric Mouth:  no thrush Lungs:  Clear throughout to auscultation. Heart:  Regular rate and rhythm; no murmurs, rubs,  or bruits. Abdomen:  Soft,  and nondistended. Mild tenderness in left lower quadrant to deep palpation. No masses, hepatosplenomegaly or hernias noted. Normal bowel sounds. Pulses:  Normal pulses noted. Neurologic:  Alert and  oriented x4 Skin:  Intact without significant lesions or rashes. Psych:  Alert and cooperative. Normal mood and affect.   Impression & Recommendations:  Problem # 1:  CLOSTRIDIUM DIFFICILE COLITIS (ICD-008.45) resolved after a course of vancomycin.  Now on probiotic. Complete probiotic  Problem # 2:  DIVERTICULITIS-COLON (ICD-562.11) recent bout of diverticulitis. Responsive to antibiotic therapy. Some residual soreness. Negative recent CT. No fevers. High-fiber diet recommended. Observe for recurrence.  Problem # 3:  ABDOMINAL PAIN, LEFT LOWER QUADRANT (ICD-789.04) possibly the residual recent problems with diverticulitis and/or C. difficile colitis. Also history of gynecologic abnormalities which may cause pain and for which she is anticipating surgery. At This point she is without fever her abdomen is benign. Close observation.  Patient Instructions: 1)  Please schedule a follow-up appointment as needed.  2)  The medication list was reviewed and reconciled.  All changed / newly prescribed medications were explained.  A complete medication list was provided to the patient / caregiver.

## 2010-02-14 NOTE — Progress Notes (Signed)
Summary: Condition Update   Phone Note Call from Patient Call back at Home Phone [phone]   Call For: Eloisa Northern Summary of Call: Had 4 formed yesterday and already one this morning. Having pain in lower stomach that started yesterday-unsure if its related to everything or something else. Initial call taken by: Leanor Kail [facility],  June 16, 2009 8:54 AM  Follow-up for Phone Call        Spoke to pt and she said she had some BM's today with stool forming.  She said she really doesn't feel well today. I advised she does have a bacterial overgrowth in the gut and it is normal to feel this way.  I told her to rest and give me an update tomorrow AM with her pain level for the LLQ she is having today.She asked if it could be diverticulitis.  I again told her to call me tomorrow with a pain update( Per [place], I told her if she continues to improve she can go back to work on Mon, 6-6. )   Follow-up by: Joselyn Glassman,  June 16, 2009 1:41 PM  Additional Follow-up for Phone Call Additional follow up Details #1::        The pt's pain she was having yesterday  has eased up alot.  She had one stool this AM that was formed.  She is feeling better today than yesterday .  I told her per Amrutha A[employer]a that we can give her a note to return to work on Mon 06-20-09.  She said she will come today before 5PM and pick it up. Additional Follow-up by: Joselyn Glassman,  June 17, 2009 9:12 AM

## 2010-02-14 NOTE — Assessment & Plan Note (Signed)
Summary: abdominal pain, diarrhea, C. diff +    History of Present Illness Visit Type: consult  Primary GI MD: Yancey Flemings MD Primary Provider: Kirke Corin, DO  Requesting Provider: Kirke Corin, DO  Chief Complaint: F/u for LLQ abd pain, loose stools, and nausea. Pt states that she is feeling better but she is still having some pain, stools are loose but getting better, and a fever   History of Present Illness:   50 year old female with hypertension, hyperlipidemia, hypothyroidism, tobacco abuse, and adenomatous colon polyps. She underwent surveillance colonoscopy May 13, 2009. She was found to have mild sigmoid diverticulosis and a diminutive descending colon polyp which was removed and found to be an adenoma. Because of complaints of intermittent diarrhea, random biopsies of the colon were performed. These were normal. Several weeks later she complained of lower abdominal pain. A CT scan of the abdomen revealed focal stranding and mesenteric fat adjacent to the sigmoid colon where diverticula were noted. This was felt to possibly represent diverticulitis. She was placed on a course of Augmentin. She subsequently developed severe diarrhea and fever. For this, she was seen in the office Jun 09, 2009 by Mike Gip [facility]. She was suspected to have Clostridium difficile associated diarrhea. Because of prior negative reaction to metronidazole (mouth ulcers) she was placed on vancomycin 250 mg q.i.d. Stool studies and he returned positive for Clostridium difficile toxin and lactoferrin. Testing for celiac sprue return negative. She presents today for followup. She is upset and tearful. She asks "what is going on with me...". She has just about completed 2 weeks of vancomycin therapy. She has had significant improvement in her bowel habits which are now formed and less frequent, though not normal. She still notices low-grade fever. Some intermittent abdominal discomfort that she describes a spasm. She had  been prescribed Bentyl, but has not used it yet. No new complaints.   GI Review of Systems    Reports abdominal pain.     Location of  Abdominal pain: lower abdomen.    Denies acid reflux, belching, bloating, chest pain, dysphagia with liquids, dysphagia with solids, heartburn, loss of appetite, nausea, vomiting, vomiting blood, weight loss, and  weight gain.      Reports diarrhea.     Denies anal fissure, black tarry stools, change in bowel habit, constipation, diverticulosis, fecal incontinence, heme positive stool, hemorrhoids, irritable bowel syndrome, jaundice, light color stool, liver problems, rectal bleeding, and  rectal pain.    Current Medications (verified): 1)  Synthroid 100 Mcg  Tabs (Levothyroxine Sodium) .... Take 1 By Mouth Qd 2)  Vitamin B-12 1000 Mcg Tabs (Cyanocobalamin) .... Take 1 Tablet Once Daily---On Hold 3)  Cardizem 30 Mg Tabs (Diltiazem Hcl) .... Take 1 Tablet By Mouth Two Times A Day 4)  Metoprolol Tartrate 25 Mg Tabs (Metoprolol Tartrate) .... Take 1/2 Tablet By Mouth Once Daily 5)  Nitrostat 0.4 Mg Subl (Nitroglycerin) .... As Needed 6)  Prilosec Otc 20 Mg Tbec (Omeprazole Magnesium) .... Take 1 Tablet By Mouth Once A Day 7)  Tums E-X 750 750 Mg Chew (Calcium Carbonate Antacid) .... Take 2 Tablets By Mouth Once Daily 8)  Xanax 0.25 Mg Tabs (Alprazolam) .... Take 1 Tab By Mouth At Bedtime As Needed 9)  Zofran 4 Mg Tabs (Ondansetron Hcl) .Marland Kitchen.. 1 By Mouth Every 6 Hours As Needed 10)  Vitamin D 1000 Unit Tabs (Cholecalciferol) .... Take 1 Tablet By Mouth Once A Day 11)  Multivitamins  Tabs (Multiple Vitamin) .... Take  1 Tablet By Mouth Once A Day -Hold 12)  Sm Coral Calcium 1000 (390 Ca) Mg Tabs (Coral Calcium) .... Take 1 Tablet By Mouth Once A Day 13)  Crestor 10 Mg Tabs (Rosuvastatin Calcium) .... Take 1 Tablet By Mouth Twice Per Week 14)  Vicodin Hp 10-660 Mg Tabs (Hydrocodone-Acetaminophen) .... Take 1 Tablet By Mouth Every 6 Hours As Needed 15)  Vancocin Hcl  250 Mg Caps (Vancomycin Hcl) .... One Tablet By Mouth Four Times Daily 16)  Florastor 250 Mg Caps (Saccharomyces Boulardii) .... One Tablet By Mouth Two Times A Day X 14 Days  Allergies (verified): 1)  ! Nsaids 2)  ! Lortab 3)  ! * Cetacaine Spray 4)  Asa 5)  Avelox 6)  Septra 7)  Flagyl  Past History:  Past Medical History: Reviewed history from 06/09/2009 and no changes required. Allergic rhinitis ADENOMATOUS COLON POLYPS DIVERTICULOSIS Hypothyroidism Urinary incontinence Hx. PUD (remote) Tobacco Abuse HTN HYPERLIPIDEMIA  Past Surgical History: Reviewed history from 06/09/2009 and no changes required. Tubal ligation (1995) Gated Spect Wall Motion Stress Cardiolite (11/05/2001) Lower Arterial (07/23/2005) EKG (02/01/2005 Uterine Mesh COLONOSCOPY-LAST 05/13/09,DIVERTICULOSIS, AND SMALL ADENOMATOUS POLYP  Family History: Reviewed history from 07/27/2008 and no changes required. Family History of Colon Cancer: Pat Uncle Family History of Colon Polyps: Father Family History of Heart Disease: Mother Family History of Irritable Bowel Syndrome:  Daughter  Social History: Reviewed history from 06/09/2009 and no changes required. 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  Patient is a former smoker. -stopped early May 2011  Review of Systems       The patient complains of fatigue and fever.  The patient denies allergy/sinus, anemia, anxiety-new, arthritis/joint pain, back pain, blood in urine, breast changes/lumps, change in vision, confusion, cough, coughing up blood, depression-new, fainting, headaches-new, hearing problems, heart murmur, heart rhythm changes, itching, menstrual pain, muscle pains/cramps, night sweats, nosebleeds, pregnancy symptoms, shortness of breath, skin rash, sleeping problems, sore throat, swelling of feet/legs, swollen lymph glands, thirst -  excessive , urination - excessive , urination changes/pain, urine leakage, vision changes, and voice change.    Vital Signs:  Patient profile:   50 year old female Height:      67.5 inches Weight:      170 pounds BMI:     26.33 BSA:     1.90 Temp:     99.5 degrees F oral Pulse rate:   76 / minute Pulse rhythm:   regular BP sitting:   104 / 60  (left arm) Cuff size:   regular  Vitals Entered By: Ok Anis CMA (June 21, 2009 3:53 PM)  Physical Exam  General:  Well developed, well nourished, no acute distress. Head:  Normocephalic and atraumatic. Eyes:  PERRLA, no icterus. Mouth:  No deformity or lesions. Neck:  Supple; no masses or thyromegaly. Lungs:  Clear throughout to auscultation. Heart:  Regular rate and rhythm; no murmurs, rubs,  or bruits. Abdomen:  Soft, nontender and nondistended. No masses, hepatosplenomegaly or hernias noted. Normal bowel sounds. Msk:  Symmetrical with no gross deformities. Normal posture. Pulses:  Normal pulses noted. Extremities:  no edema Neurologic:  Alert and  oriented x4;  Skin:  no jaundice Psych:  Alert and cooperative. Somewhat depressed-appearing   Impression & Recommendations:  Problem # 1:  CLOSTRIDIUM DIFFICILE COLITIS (ICD-008.45) This as the cause for recent problems with severe diarrhea,  abdominal discomfort, and low-grade fevers. Overall improvement since her last visit, though incomplete. Certainly not worse.  Plan: #1. Additional two-week course of vancomycin 250 mg q.i.d. #2. use Bentyl p.r.n. abdominal cramping discomfort #3. Monitor temperature at home #4. GI office followup in 4 weeks. Contact the office in the interim for questions or problems  Problem # 2:  ABDOMINAL PAIN -GENERALIZED (ICD-789.07) see above  Patient Instructions: 1)  Continue Vancocin for another 2 weeks. 2)  Take Bentyl as prescribed. 3)  Please schedule a follow-up appointment in 4  weeks.  4)  The medication list was reviewed and reconciled.   All changed / newly prescribed medications were explained.  A complete medication list was provided to the patient / caregiver. 5)  Copy: Dr. Kirke Corin

## 2010-02-14 NOTE — Letter (Signed)
Summary: Generic Letter  [facility]  [address] [place], [place] [address]   Phone: [phone]  Fax: [phone]    06/14/2009  Carollee Herter: Human Resources  Dear Ms. Homero Fellers:   I am writing you regarding [name]. We have test results that conclude that she currently has a bacterial infection named Clostridium Difficile Toxin.  The laymens term is C-Diff. This can be contagious and we do not advise her to be at work due to the fact that she has to share the restroom with other employees. She is on medication for this at this time. Amy Esterwood PA-C suggest she stay out of work until she is no longer having the diarrhea.  I asked Vonette to keep in touch with [place] daily with her progress.  We will forward a letter to your when we have released her to go back to work.        Sincerely,

## 2010-02-14 NOTE — Progress Notes (Signed)
   Phone Note Outgoing Call   Summary of Call: Rx. for Vancomycin 250 mg. p.o.q.i.d. for 2 weeks with 1 refill called to [facility] per Otelia Sergeant. Initial call taken by: Teryl Lucy RN,  July 15, 2009 2:56 PM

## 2010-02-14 NOTE — Progress Notes (Signed)
Summary: Test Results   Phone Note Call from Patient Call back at Home Phone [phone]   Call For: Mike Gip, [place] Reason for Call: Talk to Nurse Summary of Call: Wants to talk to nurse about test results. Initial call taken by: Leanor Kail [facility],  Jun 14, 2009 8:21 AM  Follow-up for Phone Call        Spoke to pt and she has had loose, watery stools over the weekend.  She has tested positive for C-Diff.  Per Celeste Tavenner, we have released her to come back to work due to her condition and the fact she has to share a  bathroom  with other co-workers.  I did speak to her HR director per Jnya's request and I did tell her what Zakariyya Helfman said that we are not releasing her yet to come back to work.  Carollee Herter HR asked me to fax her something in writing stating that.  I told Jariah to call me daily with her BM log to keep [place] updated. Follow-up by: Joselyn Glassman,  Jun 14, 2009 9:59 AM  Additional Follow-up for Phone Call Additional follow up Details #1::        Faxed to Carollee Herter fax: [phone].

## 2010-02-14 NOTE — Progress Notes (Signed)
Summary: abd pain   Phone Note Call from Patient Call back at Home Phone [phone]   Caller: Patient Call For: Dr. Marina Goodell Reason for Call: Talk to Nurse Summary of Call: abd and back pain... would like to discuss this with nurse... on second round of abx for C Diff and worried that infection is getting worse Initial call taken by: Vallarie Mare,  June 28, 2009 10:36 AM  Follow-up for Phone Call        Pt states that she started with upper abd discomfort last pm.  Pain is in ribs and back.  Pt concerned that this is from [facility].  No diarrhea at present.  Did not sleep well last pm.  No temp.  On second course of vancomycin.  Offered appt with Mike Gip, PA.  Pt states she will give it more time and call back if worsens.  She will try maalox.   Follow-up by: Ashok Cordia RN,  June 28, 2009 11:10 AM

## 2010-02-14 NOTE — Progress Notes (Signed)
Summary: [employer] to fax Prior Auth results on the Vancomycin   Phone Note Outgoing Call   Call placed by: Joselyn Glassman,  Jun 09, 2009 4:46 PM Call placed to: Patient Summary of Call: Called pt on cell phone, to let her know I will not hear from [facility] until tomorrow by fax.  I spoke to the pharmacist and he tried to fill the script but it would not go through yet.  I spoke to the pharmacist Kathlene November at [facility] and he assumed we wanted her to get started on the Vancomycin today if possible.  He was going to see what one days worth would cost and ask the pt if she would want to purchase that.  He didn't know the amount yet of one days worth. He would let the pt know and let her decide.  I told the pt that I would notify her and the pharmacist once I hear from [facility]. Initial call taken by: Joselyn Glassman,  Jun 09, 2009 4:51 PM  Follow-up for Phone Call        We called [facility] and the medication was approved.  The pt was informed to pick up medication. Follow-up by: Joselyn Glassman,  Jun 10, 2009 4:40 PM

## 2010-02-14 NOTE — Progress Notes (Signed)
Summary: discuss symptoms   Phone Note Call from Patient Call back at Home Phone [phone]   Caller: Patient Call For: Dr. Marina Goodell Reason for Call: Talk to Nurse Summary of Call: pt reporting losing weight, nausea, vomiting, diarrhea, no appetite... would like to discuss this with a nurse Initial call taken by: Vallarie Mare,  January 28, 2009 8:43 AM  Follow-up for Phone Call        Pt. has lost 30 lbs. since August 2010.Gained some of it back but appetite very poor and she has lost 6 lbs. in the past week.Is due for colon so I scheduled it for 02/11/2009.Says align helped before but she can not afford it.Pt. was offered coupon as we do not have samples.Offered [place] samples but she is going to get the [facility] that Dr.Oz advertises on TV. Follow-up by: Teryl Lucy RN,  January 28, 2009 9:45 AM  Additional Follow-up for Phone Call Additional follow up Details #1::        ok......Marland Kitchen Additional Follow-up by: Hilarie Fredrickson MD,  January 28, 2009 10:20 AM

## 2010-02-14 NOTE — Progress Notes (Signed)
Summary: Condition update   Phone Note Call from Patient Call back at Home Phone [phone]   Caller: Patient Call For: Mike Gip Reason for Call: Talk to Nurse Summary of Call: Update: No diarrhea yesterday only soft stools. Pt. would like some info on C-diff. Initial call taken by: Karna Christmas,  June 15, 2009 8:36 AM  Follow-up for Phone Call        The pt doesn't have access to a computer at home.  I read her information from [employer] about C-Diff. SHe said her stools are very soft but not watery.  She asked about going back to work and I advised her she needs to stay hydrated, rest and take her antibiotics and give it a few more days.  I told her to call tomorrow. Follow-up by: Joselyn Glassman,  June 15, 2009 9:07 AM

## 2010-02-14 NOTE — Progress Notes (Signed)
Summary: Triage   Phone Note Call from Patient Call back at Home Phone [phone]   Caller: Patient Call For: Dr. Marina Goodell Reason for Call: Talk to Nurse Summary of Call: Pt has some questions about her meds. Initial call taken by: Karna Christmas,  June 24, 2009 3:32 PM  Follow-up for Phone Call        message left to call back  Teryl Lucy RN  June 24, 2009 4:05 PM Pt.started Bentyl on Wed.and yesterday noticed white patches with some open under her tongue so she stopped Bentyl.Pt. has been on Vancomycin x2 weeks and it is to be cont'd x2 more weeks. Follow-up by: Teryl Lucy RN,  June 24, 2009 4:21 PM  Additional Follow-up for Phone Call Additional follow up Details #1::        white patches more likely thrush possibly from vancomycin please recontact her as to status and let me know Additional Follow-up by: Iva Boop MD, Clementeen Graham,  June 27, 2009 8:48 AM   New Allergies: ! BENTYL Additional Follow-up for Phone Call Additional follow up Details #2::    message left to call back  Teryl Lucy RN  June 27, 2009 9:20 AM Patches in mouth  have  completely disappeared since stopping Bentyl. Follow-up by: Teryl Lucy RN,  June 27, 2009 9:38 AM  Additional Follow-up for Phone Call Additional follow up Details #3:: Details for Additional Follow-up Action Taken: ok list as reaction to Bentyl in Alerts if she needa another antispasmodic we could Rx something else Additional Follow-up by: Iva Boop MD, Clementeen Graham,  June 27, 2009 10:50 AM  New Allergies: ! BENTYL

## 2010-02-14 NOTE — Progress Notes (Signed)
Summary: results   Phone Note Call from Patient Call back at Home Phone [phone]   Caller: Patient Call For: Joan Robinson Reason for Call: Talk to Nurse, Lab or Test Results Complaint: Breathing Problems Summary of Call: Patient wants to give update on her cond. and would like results form last week. Initial call taken by: Tawni Levy,  July 19, 2009 1:39 PM  Follow-up for Phone Call        Pt. informed C.diff. is negative.Never started Vanco because insurance did  not authorize it.No further vomiting with mild generalized abd. discomrt level1-2.Has small formed stools mostly yellow in color.Wants to know why she gets these cont'd episodes of severe symptpms and what is the next plan you have in mind? Follow-up by: Teryl Lucy RN,  July 19, 2009 2:43 PM  Additional Follow-up for Phone Call Additional follow up Details #1::        NOT SURE WHY, THOUGH SHE HAS HAD C. DIFF AND DIVERTICULITIS AT LEAST ONCE EACH. NOTHING NEW PLANNED FOR NOW, THOUGH SHE COULD TRY ALIGN SAMPLES FOR 3 WEEKS. GLAD SHE'S DOING BETTER Additional Follow-up by: Hilarie Fredrickson MD,  July 19, 2009 2:46 PM    Additional Follow-up for Phone Call Additional follow up Details #2::    Pt. ntfd. and had bought Librarian, academic on Sat. and started it since she thought she was going back on Vanco.Asking why the fever each time pain occurs? Should she keep rov with you for Monday? Follow-up by: Teryl Lucy RN,  July 19, 2009 3:29 PM  Additional Follow-up for Phone Call Additional follow up Details #3:: Details for Additional Follow-up Action Taken: FVR CAN GO W/ EITHER PROBLEM. KEEP ROV Additional Follow-up by: Hilarie Fredrickson MD,  July 19, 2009 3:33 PM  Pt. ntfd. of Dr.Perry's recommendations  Appended Document: results Informed [facility] that we do not need the pt to have the Vancocin medication now.  They had faxed [place] a form to do a Prior Authorization.  I advised them Dr. Marina Robinson and his nurse evaluated the pt and got a  progress report and she has improved.  I advised the pharmacist that the pt doesn't need this medcation at this time.

## 2010-02-14 NOTE — Letter (Signed)
Summary: Generic Letter- Return to work  [facility]  [address] [place], [place] [address]   Phone: [phone]  Fax: [phone]    06/17/2009  Human Resources Department  To whom it may concern:   Our patient, Joan Robinson, may return to work on Monday 06-20-09. If you have any questions call our office at [phone].       Sincerely,     Jaymian Bogart PA-C

## 2010-02-14 NOTE — Miscellaneous (Signed)
Summary: [facility]  Clinical Lists Changes  Medications: Added new medication of MOVIPREP 100 GM  SOLR (PEG-KCL-NACL-NASULF-NA ASC-C) As per prep instructions. - Signed Rx of MOVIPREP 100 GM  SOLR (PEG-KCL-NACL-NASULF-NA ASC-C) As per prep instructions.;  #1 x 0;  Signed;  Entered by: Ezra Sites RN;  Authorized by: Hilarie Fredrickson MD;  Method used: Print then Give to Patient Allergies: Removed allergy or adverse reaction of * TRAMADOL Changed allergy or adverse reaction from ASA to ASA Changed allergy or adverse reaction from AVELOX to [facility] Changed allergy or adverse reaction from [facility] to [facility] Changed allergy or adverse reaction from FLAGYL to FLAGYL Added new allergy or adverse reaction of NSAIDS Added new allergy or adverse reaction of LORTAB Added new allergy or adverse reaction of * CETACAINE SPRAY    Prescriptions: MOVIPREP 100 GM  SOLR (PEG-KCL-NACL-NASULF-NA ASC-C) As per prep instructions.  #1 x 0   Entered by:   Ezra Sites RN   Authorized by:   Hilarie Fredrickson MD   Signed by:   Ezra Sites RN on 02/04/2009   Method used:   Print then Give to Patient   RxID:   [id number]

## 2010-02-14 NOTE — Assessment & Plan Note (Signed)
Summary: 2:30 p.m. appt/diverticulitis/Perry pt.    History of Present Illness Visit Type: Follow-up Visit Primary GI MD: Yancey Flemings MD Primary Provider: Candie Echevaria, MD Requesting Provider: n/a Chief Complaint: Patient here c/o generalized abdominal discomfort which began as LLQ abdominal pain. She also c/o fever and occasional nausea. She is having 6-7 loose bm's daily along with some brbpr and occasional rectal discomfort. History of Present Illness:   PLEASANT 50 YO FEMALE KNOWN TO DR. PERRY WITH HX OF MILD DIVERTICULOSIS, AND PRIOR HX OF LARGE ADENOMATOUS POLYPS. SHE HAD A RECENT COLONOSCOPY 05/13/09 ,WITH ONE DIMUNITIVE ADENOMA. SHE STATRED HAVING LOWER ABDOMINAL PAIN A COUPLE WEEKS AGO,SAW HER PRIMARY MD. SHE HAD A CT SCAN ON 5/10 AT [facility] THAT SHOWED FOCAL MESENTERIC FAT STRANDING ADJACENT TO THE SIGMOID COLON. SHE WAS STARTED ON AUGMENTIN. SHE ALSO HAD A PELVIC US TO EXCLUDE PELVIC PATHOLOGY-THISSHOWED UTERINE LEIOMYOMAS.  SHE SAYS SHE DID FEEL MUCHBETTER AFTER TAKING THE AUGMENTIN AND HER PAIN PRETTY MUCH RESOLVED. HOWEVER SHE HAS BEEN HAVING SOME PROBLEMS WITH DIARRHEA OVER THE PAST YEAR WITH A COUPLE LOOSE STOOLS DAILY. AFTER TAKING THE AUGMENTIN THE DIARRHEA HAS WORSENED. ON 5/22 SHE DEVELOPED CHILLS,LOW GRADE FEVER,ACHING ALL OVER, DECREASED APPETITE ,AND HAS BEEN HAVING 6-7 LOOSE BM'S/DAY. LAST NIGHT SHE WOKE UP WITH URGENCY AND DIARRHEA, AND HAS HAD 8 BM'S TODAY ALL LOOSE. SHE FEELS POORLY IN GENERAL,BUT HAS BEEN TRYING TO WORK.SHE C/O GENERALIZED CRAMPING,"KNOTS" IN HER ABDOMEN, HAS NOT EATEN TODAY.   GI Review of Systems    Reports abdominal pain and  acid reflux.     Location of  Abdominal pain: lower abdomen.    Denies belching, bloating, chest pain, dysphagia with liquids, dysphagia with solids, heartburn, loss of appetite, nausea, vomiting, vomiting blood, and  weight loss.      Reports change in bowel habits, diarrhea, and  diverticulosis.     Denies anal fissure, black  tarry stools, fecal incontinence, heme positive stool, hemorrhoids, irritable bowel syndrome, jaundice, light color stool, liver problems, rectal bleeding, and  rectal pain. Preventive Screening-Counseling & Management  Alcohol-Tobacco     Smoking Status: quit    Current Medications (verified): 1)  Synthroid 100 Mcg  Tabs (Levothyroxine Sodium) .... Take 1 By Mouth Qd 2)  Vitamin B-12 1000 Mcg Tabs (Cyanocobalamin) .... Take 1 Tablet Once Daily---On Hold 3)  Cardizem 30 Mg Tabs (Diltiazem Hcl) .... Take 1 Tablet By Mouth Two Times A Day 4)  Metoprolol Tartrate 25 Mg Tabs (Metoprolol Tartrate) .... Take 1/2 Tablet By Mouth Once Daily 5)  Nitrostat 0.4 Mg Subl (Nitroglycerin) .... As Needed 6)  Prilosec Otc 20 Mg Tbec (Omeprazole Magnesium) .... Take 1 Tablet By Mouth Once A Day 7)  Tums E-X 750 750 Mg Chew (Calcium Carbonate Antacid) .... Take 2 Tablets By Mouth Once Daily 8)  Xanax 0.25 Mg Tabs (Alprazolam) .... Take 1 Tab By Mouth At Bedtime As Needed 9)  Zofran 4 Mg Tabs (Ondansetron Hcl) .Marland Kitchen.. 1 By Mouth Every 6 Hours As Needed 10)  Vitamin D 1000 Unit Tabs (Cholecalciferol) .... Take 1 Tablet By Mouth Once A Day 11)  Multivitamins  Tabs (Multiple Vitamin) .... Take 1 Tablet By Mouth Once A Day -Hold 12)  Sm Coral Calcium 1000 (390 Ca) Mg Tabs (Coral Calcium) .... Take 1 Tablet By Mouth Once A Day 13)  Crestor 10 Mg Tabs (Rosuvastatin Calcium) .... Take 1 Tablet By Mouth Twice Per Week 14)  Vicodin Hp 10-660 Mg Tabs (Hydrocodone-Acetaminophen) .... Take 1 Tablet By  Mouth Every 6 Hours As Needed  Allergies (verified): 1)  ! Nsaids 2)  ! Lortab 3)  ! * Cetacaine Spray 4)  Asa 5)  Avelox 6)  Septra 7)  Flagyl  Past History:  Past Medical History: Allergic rhinitis ADENOMATOUS COLON POLYPS DIVERTICULOSIS Hypothyroidism Urinary incontinence Hx. PUD (remote) Tobacco Abuse HTN HYPERLIPIDEMIA  Past Surgical History: Tubal ligation (1995) Gated Spect Wall Motion Stress  Cardiolite (11/05/2001) Lower Arterial (07/23/2005) EKG (02/01/2005 Uterine Mesh COLONOSCOPY-LAST 05/13/09,DIVERTICULOSIS, AND SMALL ADENOMATOUS POLYP  Family History: Reviewed history from 07/27/2008 and no changes required. 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  Patient is a former smoker. -stopped early May 2011 Smoking Status:  quit  Review of Systems       The patient complains of arthritis/joint pain, back pain, breast changes/lumps, fatigue, fever, muscle pains/cramps, thirst - excessive, and urination - excessive.  The patient denies allergy/sinus, anemia, anxiety-new, change in vision, confusion, cough, coughing up blood, depression-new, fainting, headaches-new, hearing problems, heart murmur, heart rhythm changes, itching, menstrual pain, night sweats, nosebleeds, pregnancy symptoms, shortness of breath, skin rash, sleeping problems, sore throat, swelling of feet/legs, swollen lymph glands, thirst - excessive , urination - excessive , urination changes/pain, urine leakage, vision changes, and voice change.         ROS OTHERWISE AS IN HPI  Vital Signs:  Patient profile:   50 year old female Height:      67.5 inches Weight:      170 pounds BMI:     26.33 BSA:     1.90 Pulse rate:   76 / minute Pulse rhythm:   regular BP sitting:   106 / 64  (left arm)  Vitals Entered By: Lamona Curl CMA Duncan Dull) (Jun 09, 2009 2:45 PM)  Physical Exam  General:  Well developed, well nourished, no acute distress. Head:  Normocephalic and atraumatic. Eyes:  PERRLA, no icterus. Breasts:  No masses, tenderness or gynecomastia noted. Lungs:  Clear throughout to auscultation. Heart:   Regular rate and rhythm; no murmurs, rubs,  or bruits. Abdomen:  SOFT, MILD DIFFUSE TENDERNESS, NO MASS OR HSM,BS+ Rectal:  NOT DONE Extremities:  No clubbing, cyanosis, edema or deformities noted. Neurologic:  Alert and  oriented x4;  grossly normal neurologically. Psych:  Alert and cooperative. Normal mood and affect.anxious.     Impression & Recommendations:  Problem # 1:  DIARRHEA-PRESUMED INFECTIOUS (ICD-009.3) Assessment Deteriorated 50 YO FEMALE WITH MILD CHANGE IN  BOWEL HABITS X ONE YEAR-WITH ABRUPT WORSENING OF DIARRHEA, ABDOMINAL CRAMPING,LOW GRADE FEVERS AND MALAISE AFTER A COURSE OF AUGMENTIN FOR DIVERTICULITIS. SUSPECT C.DIFF COLITIS  CBC AND CMET WITHIN THIS PAST WEEK UNREMARKABLE. STOOL FOR C.DIFF SUBMITTED TO DR. Kathi Der OFFICE THIS AM. WILL CHECK ONE MORE STOOL FOR C.DIFF,STOLL LACTOFERRIN PT IS ALLERGIC TO FLAGYL-WILL TREAT EMPIRICALLY FOR NOW WITH VANCOMYCIN 250 MG 4 X DAILY X 14 DAYS FLORASTOR ONE TWICE DAILY X 3 WEEKS SHE WAS GIVEN AN RX FOR BENTYL -ADVISED SHE START THISM 10 MG 3 TIMES DAILY AS NEEDED FOR CRAMPING PUSH FLUIDS,VERY SOFT,BLAND DIET . OUT OF WORK NEXT 48 HOURS. SHE IS ADVISED TO CALL IF SXS WORSEN AT ANY POINT KEEP FOLLOWUP APPT WITH DR. PERRY ON 6/7.  Will also follow-up on stool studies submitted through Dr. Kathi Der office. Main possibilities seem to be infectious vs. IBD. Iva Boop MD, [facility]  Jun 10, 2009 9:38 AM  Problem # 2:  DIVERTICULOSIS-COLON (ICD-562.10) Assessment: Comment Only  Problem # 3:  HELICOBACTER PYLORI GASTRITIS, HX OF (ICD-V12.79) Assessment: Comment Only  Problem # 4:  PERSONAL HX COLONIC POLYPS (ICD-V12.72) Assessment: Comment Only ADENOMATOUS-DUE FOR FOLLOW UP 04/2014  Problem # 5:  HYPERTENSION (ICD-401.9) Assessment: Comment Only  Problem # 6:  HYPERLIPIDEMIA (ICD-272.4) Assessment: Comment Only  Other Orders: T-Sprue Panel (Celiac Disease Aby Eval) (83516x3/[id number]) T-Culture, C-Diff Toxin A/B  ([id number]) T-Fecal WBC ([id number])  Patient Instructions: 1)  Make sure to drink plenty 2)  Recommend increasing fluid intake for hydration for the next few days. You can gradually advance your diet as tolerated. 3)  We have sent you a prescription for Vancomycin to your pharmacy [facility]). Please pick this up and begin as soon as possible. 4)  Take Florastor 1 tablet by mouth two times a day x 3 weeks. A prescription has been sent to your pharmacy. 5)  Take Bentyl 10 mg three times a day as needed for abdominal cramps. 6)  Please go to the basement to have your labs drawn today before you leave. 7)  We will call you on Tuesday to make certain that you are feeling better. 8)  Copy sent to : Dr Candie Echevaria, Dr Yancey Flemings 9)  The medication list was reviewed and reconciled.  All changed / newly prescribed medications were explained.  A complete medication list was provided to the patient / caregiver.

## 2010-02-14 NOTE — Progress Notes (Signed)
Summary: ABD Pain / fvr / loose stool   Phone Note Call from Patient Call back at Home Phone [phone]   Call For: Dr Marina Goodell Reason for Call: Talk to Nurse Summary of Call: Having lower left abd pain,vomitting, running low grade fever. Just started. Initial call taken by: Leanor [facility],  July 14, 2009 8:45 AM  Follow-up for Phone Call        LLQ pain x 3days.Level has increased to a 7-8 and is constant.Vomiting white foamy matter.Loose formed stool with a lot of crampingx5 yesterday.Temp. at 3:15 am was 99.5 Follow-up by: Teryl Lucy RN,  July 14, 2009 9:06 AM  Additional Follow-up for Phone Call Additional follow up Details #1::        Resume vancomycin 250mg  qid x one month and have her seen by extender today Additional Follow-up by: Hilarie Fredrickson MD,  July 14, 2009 9:26 AM    Additional Follow-up for Phone Call Additional follow up Details #2::    Given appt. with PA for this afternoon. Follow-up by: Teryl Lucy RN,  July 14, 2009 9:45 AM

## 2010-02-14 NOTE — Assessment & Plan Note (Signed)
Summary: LLQ pain/fever-Dr.Perry pt./cl    History of Present Illness Visit Type: Follow-up Visit Primary GI MD: Yancey Flemings MD Primary Provider: Kirke Corin, DO  Requesting Provider: na Chief Complaint: LLQ abd pain, nausea, vomiting, and fever  History of Present Illness:   Joan Robinson 50 Y.O FEMALE KNOWN TO DR. PERRY. SHE HAS HAD A RECENT EPISODE OF DIVERTICULITIS 5/11 TREATED WITH AUGMENTIN . SHE SUBSEQUENTLY DEVELOPED DIARRHEA ,ABDOMINAL PAIN AND NAUSEA AND WAS CONFIMED TO HAVE C.DIFF. SHE WAS TREATED WITH VANCOMYCIN  AS SHE IS INTOLERANT TO FLAGYL. SHE STARTED TREATMENT 5/26. SHE IMPROVED GRADUALLY AND WAS SEEN IN FOLLOW UP 6/7.AT THAT TIME  SHE WAS STILL HAVING INTERMITTENT LOW GRADE FEVER, SOME LOWER ABDOMINAL PAIN.STOOLS WERE SOFT. SHE WAS TREATED WITH ANOTHER 2 WEEKS OF VANCOMYCIN. SHE COMES BACK TODAY HAVING COMPLETED THE VANCO 6 DAYS AGO. SHE FELT GOOD,WAS HAVING SOMEWHAT LOOSE STOOL BUT NO DIARRHEA, WAS EATING WELL AND HAD NO PAIN. SHE THEN STARTED WITH LLQ ABDOMINAL PAIN MONDAY 6/27. SHE SWA HER GYN-NEGATIVE EXAM BUT IS TO HAVE HYSTERECTOMY IN 9/11 FOR LEIOMYOMAS. HER PAIN HAS PAROGRESSED, IS NOW CONSTANT,SHE IS TAKING VICODEN WITHOUT MUCH HELP. SHE HAD FEVER/CHILLS LAST NIGHT AND THIS AM-TEMP99.5. SHE WAS NAUSEATED LAST NIGHT AND VOMITED OFF AND ON THRU THE NIGHT. HER PAIN IS A CONSTANT "TOOTH ACHE"THROB.  NO DIARRHEA ,BUT MORE FREQUENT STOOLS PAST 2 DAYS. SHE IS FRUSTRATED AND TEARFUL -TIRED OF BEING SICK,AND AFRAID SHE WILL LOSE HER JOB.   GI Review of Systems    Reports abdominal pain, loss of appetite, nausea, and  vomiting.     Location of  Abdominal pain: LLQ.    Denies acid reflux, belching, bloating, chest pain, dysphagia with liquids, dysphagia with solids, heartburn, vomiting blood, and  weight loss.      Reports diarrhea and  diverticulosis.     Denies anal fissure, black tarry stools, change in bowel habit, constipation, fecal incontinence, heme positive stool,  hemorrhoids, irritable bowel syndrome, jaundice, light color stool, liver problems, rectal bleeding, and  rectal pain.    Current Medications (verified): 1)  Synthroid 100 Mcg  Tabs (Levothyroxine Sodium) .... Take 1 By Mouth Qd 2)  Vitamin B-12 1000 Mcg Tabs (Cyanocobalamin) .... Take 1 Tablet Once Daily---On Hold 3)  Cardizem 30 Mg Tabs (Diltiazem Hcl) .... Take 1 Tablet By Mouth Two Times A Day 4)  Metoprolol Tartrate 25 Mg Tabs (Metoprolol Tartrate) .... Take 1/2 Tablet By Mouth Once Daily 5)  Nitrostat 0.4 Mg Subl (Nitroglycerin) .... As Needed 6)  Prilosec Otc 20 Mg Tbec (Omeprazole Magnesium) .... Take 1 Tablet By Mouth Once A Day 7)  Tums E-X 750 750 Mg Chew (Calcium Carbonate Antacid) .... Take 2 Tablets By Mouth Once Daily 8)  Xanax 0.25 Mg Tabs (Alprazolam) .... Take 1 Tab By Mouth At Bedtime As Needed 9)  Zofran 4 Mg Tabs (Ondansetron Hcl) .Marland Kitchen.. 1 By Mouth Every 6 Hours As Needed 10)  Vitamin D 1000 Unit Tabs (Cholecalciferol) .... Take 1 Tablet By Mouth Once A Day 11)  Multivitamins  Tabs (Multiple Vitamin) .... Take 1 Tablet By Mouth Once A Day -Hold 12)  Sm Coral Calcium 1000 (390 Ca) Mg Tabs (Coral Calcium) .... Take 1 Tablet By Mouth Once A Day 13)  Crestor 10 Mg Tabs (Rosuvastatin Calcium) .... Take 1 Tablet By Mouth Twice Per Week 14)  Vicodin Hp 10-660 Mg Tabs (Hydrocodone-Acetaminophen) .... Take 1 Tablet By Mouth Every 6 Hours As Needed  Allergies (verified): 1)  ! Nsaids  2)  ! Lortab 3)  ! * Cetacaine Spray 4)  ! Bentyl 5)  Asa 6)  Avelox 7)  Septra 8)  Flagyl  Past History:  Past Medical History: Allergic rhinitis ADENOMATOUS COLON POLYPS DIVERTICULOSIS/HX DIVERTICULITIS C.DIFF COLITIS 5/11,6/11 Hypothyroidism Urinary incontinence Hx. PUD (remote) Tobacco Abuse HTN HYPERLIPIDEMIA  Past Surgical History: Reviewed history from 06/09/2009 and no changes required. Tubal ligation (1995) Gated Spect Wall Motion Stress Cardiolite (11/05/2001) Lower  Arterial (07/23/2005) EKG (02/01/2005 Uterine Mesh COLONOSCOPY-LAST 05/13/09,DIVERTICULOSIS, AND SMALL ADENOMATOUS POLYP  Family History: Reviewed history from 07/27/2008 and no changes required. Family History of Colon Cancer: Pat Uncle Family History of Colon Polyps: Father Family History of Heart Disease: Mother Family History of Irritable Bowel Syndrome:  Daughter  Social History: Reviewed history from 06/09/2009 and no changes required. 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  Patient is a former smoker. -stopped early May 2011  Review of Systems       The patient complains of fever.  The patient denies allergy/sinus, anemia, anxiety-new, arthritis/joint pain, back pain, blood in urine, breast changes/lumps, change in vision, confusion, cough, coughing up blood, depression-new, fainting, fatigue, headaches-new, hearing problems, heart murmur, heart rhythm changes, itching, menstrual pain, muscle pains/cramps, night sweats, nosebleeds, pregnancy symptoms, shortness of breath, skin rash, sleeping problems, sore throat, swelling of feet/legs, swollen lymph glands, thirst - excessive , urination - excessive , urination changes/pain, urine leakage, vision changes, and voice change.         ROS OTHERWISE AS IN HPI  Vital Signs:  Patient profile:   50 year old female Height:      67.5 inches Weight:      169 pounds BMI:     26.17 BSA:     1.89 Temp:     99.8 degrees F oral Pulse rate:   72 / minute Pulse rhythm:   regular BP sitting:   100 / 64  (left arm) Cuff size:   regular  Vitals Entered By: Ok Anis CMA (July 14, 2009 2:10 PM)  Physical Exam  General:  Well developed, well nourished, no acute distress.,TIRED APPEARING Head:  Normocephalic and atraumatic. Eyes:  PERRLA, no icterus. Lungs:  Clear throughout to auscultation. Heart:  Regular rate and rhythm;  no murmurs, rubs,  or bruits. Abdomen:  SOFT, TENDER LLQ,NO REBOUND, NO MASS, MILD TENDERNESS ACROSS LOWER ABDOMEN, BS+ Rectal:  NOT DONE Extremities:  No clubbing, cyanosis, edema or deformities noted. Neurologic:  Alert and  oriented x4;  grossly normal neurologically. Psych:  Alert and cooperative. Normal mood and affect., TEARFUL   Impression & Recommendations:  Problem # 1:  ABDOMINAL PAIN, LEFT LOWER QUADRANT (ICD-789.04) Assessment Deteriorated 50 YO FEMALE WITH RECENT DIVERTICULITIS,COMPLICATED BY C. DIFF COLITIS. JUST COMPLETED TREATMENT FOR C.DIFF,NOW 6 DAYS LATER ILL X 3 DAYS WITH PROGRESSIVE LLQ PAIN, FEVER, N/V ,LOOSE STOOL BUT NO DIARRHEA. R/O RECURRENT DIVERTICULITIS VS. RELAPSING C.DIFF,THUS FAR NOT MANIFESTING WITH DIARRHEA.  LABS AS BELOW,STOOL FOR C.DIFF SCHEDULE FOR CT SCAN ABD/PELVIS  ASAP HOLD ABX UNTIL LABS AND CT REVIEWED HOME TO REST,PUSH FLUIDS PT HAS VICODEN AND ZOFRAN FOR as needed USE Orders: T-Culture, C-Diff Toxin A/B ([id number]) CT Abdomen/Pelvis with Contrast (CT Abd/Pelvis w/con) TLB-CBC Platelet - w/Differential (85025-CBCD) TLB-BMP (Basic Metabolic Panel-BMET) (80048-METABOL)  Problem # 2:  CLOSTRIDIUM DIFFICILE COLITIS (ICD-008.45) Assessment: Comment Only JUST COMPLETED 4 WEEK COURSE OF VANCOMYCIN Orders: T-Culture, C-Diff  Toxin A/B [id number]) CT Abdomen/Pelvis with Contrast (CT Abd/Pelvis w/con) TLB-CBC Platelet - w/Differential (85025-CBCD) TLB-BMP (Basic Metabolic Panel-BMET) (80048-METABOL)  Problem # 3:  HYPERTENSION (ICD-401.9) Assessment: Comment Only  Problem # 4:  DIVERTICULOSIS-COLON (ICD-562.10) Assessment: Comment Only  Problem # 5:  PERSONAL HX COLONIC POLYPS (ICD-V12.72) Assessment: Comment Only  Problem # 6:  GERD (ICD-530.81) Assessment: Comment Only STABLE ON PRILOSEC  Problem # 7:  ANXIETY (ICD-300.00) Assessment: Comment Only  Patient Instructions: 1)  We have scheduled you for a CT abdomen/pelvis for  07/15/09 at [facility] CT on [address]. You have been given written instructions regarding this. 2)  Please go to the lab before leaving the office today for CBC, BMET and Stool for CDiff. 3)  Recommend increasing fluid intake for hydration for the next few days.  4)  Copy sent to : Dr Candie Echevaria, Dr Yancey Flemings 5)  The medication list was reviewed and reconciled.  All changed / newly prescribed medications were explained.  A complete medication list was provided to the patient / caregiver.

## 2010-02-14 NOTE — Letter (Signed)
Summary: Office Visit Letter  [facility]  [address] [place], [place] [address]   Phone: [phone]  Fax: [phone]      February 23, 2009 MRN: [id number]   Joan Robinson [address] [place], [place]  [address]   Dear Ms. Swider,   According to our records, it is time for you to schedule a follow-up office visit with [place] in the month of March 2011.   At your convenience, please call [phone] (option #2)to schedule an office visit. If you have any questions, concerns, or feel that this letter is in error, we would appreciate your call.   Sincerely,  Wilhemina Bonito. Marina Goodell, M.D.   [facility] [phone]

## 2010-02-14 NOTE — Miscellaneous (Signed)
Summary: med  Clinical Lists Changes  Medications: Added new medication of MOVIPREP 100 GM  SOLR (PEG-KCL-NACL-NASULF-NA ASC-C) As directed - Signed Rx of MOVIPREP 100 GM  SOLR (PEG-KCL-NACL-NASULF-NA ASC-C) As directed;  #1 x 0;  Signed;  Entered by: Clide Cliff RN;  Authorized by: Hilarie Fredrickson MD;  Method used: Faxed to [facility], [address] [place] [address], [place], [place]  [address], Ph: [phone], Fax: [phone]    Prescriptions: MOVIPREP 100 GM  SOLR (PEG-KCL-NACL-NASULF-NA ASC-C) As directed  #1 x 0   Entered by:   Clide Cliff RN   Authorized by:   Hilarie Fredrickson MD   Signed by:   Clide Cliff RN on 05/04/2009   Method used:   Faxed to ...       [facility] (retail)       [address] [place] [address]       [place], [place]  [address]       Ph: [phone]       Fax: [phone]   RxID:   [id number]

## 2010-02-14 NOTE — Letter (Signed)
Summary: [facility] Instructions  [facility]  [address] [place], [place] [address]   Phone: [phone]  Fax: [phone]       Joan Robinson    December 04, 1960    MRN: [id number]        Procedure Day /Date: Friday 05/13/2009     Arrival Time: 1:30 pm      Procedure Time: 2:30 pm     Location of Procedure:                    _x _  [facility] (4th Floor)                        PREPARATION FOR COLONOSCOPY WITH MOVIPREP   Starting 5 days prior to your procedure Sunday 4/24 do not eat nuts, seeds, popcorn, corn, beans, peas,  salads, or any raw vegetables.  Do not take any fiber supplements (e.g. Metamucil, Citrucel, and Benefiber).  THE DAY BEFORE YOUR PROCEDURE         DATE: Thursday 4/28  1.  Drink clear liquids the entire day-NO SOLID FOOD  2.  Do not drink anything colored red or purple.  Avoid juices with pulp.  No orange juice.  3.  Drink at least 64 oz. (8 glasses) of fluid/clear liquids during the day to prevent dehydration and help the prep work efficiently.  CLEAR LIQUIDS INCLUDE: Water Jello Ice Popsicles Tea (sugar ok, no milk/cream) Powdered fruit flavored drinks Coffee (sugar ok, no milk/cream) Gatorade Juice: apple, white grape, white cranberry  Lemonade Clear bullion, consomm, broth Carbonated beverages (any kind) Strained chicken noodle soup Hard Candy                             4.  In the morning, mix first dose of MoviPrep solution:    Empty 1 Pouch A and 1 Pouch B into the disposable container    Add lukewarm drinking water to the top line of the container. Mix to dissolve    Refrigerate (mixed solution should be used within 24 hrs)  5.  Begin drinking the prep at 5:00 p.m. The MoviPrep container is divided by 4 marks.   Every 15 minutes drink the solution down to the next mark (approximately 8 oz) until the full liter is complete.   6.  Follow completed prep with 16 oz of clear liquid of your choice (Nothing red  or purple).  Continue to drink clear liquids until bedtime.  7.  Before going to bed, mix second dose of MoviPrep solution:    Empty 1 Pouch A and 1 Pouch B into the disposable container    Add lukewarm drinking water to the top line of the container. Mix to dissolve    Refrigerate  THE DAY OF YOUR PROCEDURE      DATE: Friday 4/29  Beginning at 9:30 a.m. (5 hours before procedure):         1. Every 15 minutes, drink the solution down to the next mark (approx 8 oz) until the full liter is complete.  2. Follow completed prep with 16 oz. of clear liquid of your choice.    3. You may drink clear liquids until 12:30 pm (2 HOURS BEFORE PROCEDURE).   MEDICATION INSTRUCTIONS  Unless otherwise instructed, you should take regular prescription medications with a small sip of water   as early as possible the morning of your  procedure.           OTHER INSTRUCTIONS  You will need a responsible adult at least 50 years of age to accompany you and drive you home.   This person must remain in the waiting room during your procedure.  Wear loose fitting clothing that is easily removed.  Leave jewelry and other valuables at home.  However, you may wish to bring a book to read or  an iPod/MP3 player to listen to music as you wait for your procedure to start.  Remove all body piercing jewelry and leave at home.  Total time from sign-in until discharge is approximately 2-3 hours.  You should go home directly after your procedure and rest.  You can resume normal activities the  day after your procedure.  The day of your procedure you should not:   Drive   Make legal decisions   Operate machinery   Drink alcohol   Return to work  You will receive specific instructions about eating, activities and medications before you leave.    The above instructions have been reviewed and explained to me by   Clide Cliff, RN_______________________    I fully understand and can  verbalize these instructions _____________________________ Date _________

## 2010-02-14 NOTE — Letter (Signed)
Summary: Patient Notice- Polyp Results  [facility]  [address] [place], [place] [address]   Phone: [phone]  Fax: [phone]        May 17, 2009 MRN: [id number]    ANNLOUISE GERETY [address] [place], [place]  [address]    Dear Ms. Teston,  I am pleased to inform you that the colon polyp(s) removed during your recent colonoscopy was (were) found to be benign (no cancer detected) upon pathologic examination.  I recommend you have a repeat colonoscopy examination in 5 years to look for recurrent polyps, as having colon polyps increases your risk for having recurrent polyps or even colon cancer in the future.  ALSO, THE RANDOM COLON BIOPSIES WERE NORMAL WITH NO PROBLEM FOUND OR CAUSE FOR INTERMITTENT DIARRHEA FOUND.  Should you develop new or worsening symptoms of abdominal pain, bowel habit changes or bleeding from the rectum or bowels, please schedule an evaluation with either your primary care physician or with me.  Additional information/recommendations:  __ No further action with gastroenterology is needed at this time. Please      follow-up with your primary care physician for your other healthcare      needs.   Please call us if you are having persistent problems or have questions about your condition that have not been fully answered at this time.  Sincerely,  Hilarie Fredrickson MD  This letter has been electronically signed by your physician.  Appended Document: Patient Notice- Polyp Results letter mailed 5.5.11

## 2010-02-14 NOTE — Medication Information (Signed)
Summary: Approval/[employer]  Approval/[employer]   Imported By: Lester [place] 06/28/2009 14:16:17  _____________________________________________________________________  External Attachment:    Type:   Image     Comment:   External Document

## 2010-02-14 NOTE — Procedures (Signed)
Summary: Colonoscopy  Patient: Joan Robinson Note: All result statuses are Final unless otherwise noted.  Tests: (1) Colonoscopy (COL)   COL Colonoscopy           DONE     [facility]     [address]     [place], [place]  [address]           COLONOSCOPY PROCEDURE REPORT           PATIENT:  Joan Robinson, Joan Robinson  MR#:  [id number]     BIRTHDATE:  [birth year], 48 yrs. old  GENDER:  female     ENDOSCOPIST:  Wilhemina Bonito. Eda Keys, MD     REF. BY:  Surveillance Program Recall,     PROCEDURE DATE:  05/13/2009     PROCEDURE:  Colonoscopy with biopsies,     Colonoscopy with snare polypectomy     ASA CLASS:  Class II     INDICATIONS:  history of pre-cancerous (adenomatous) colon polyps     ; INDEX 2003 W/ MULTIPLE AND LARGE ADENOMAS; F/U 2006 NEG. ;     intermittent diarrhea     MEDICATIONS:   Fentanyl 100 mcg IV, Versed 10 mg IV, Benadryl 12.5     mg IV           DESCRIPTION OF PROCEDURE:   After the risks benefits and     alternatives of the procedure were thoroughly explained, informed     consent was obtained.  Digital rectal exam was performed and     revealed no abnormalities.   The LB CF-[id number] endoscope     was introduced through the anus and advanced to the cecum, which     was identified by both the appendix and ileocecal valve, without     limitations.Time to cecum = 1:58 min.  The quality of the prep was     excellent, using MoviPrep.  The instrument was then slowly     withdrawn (time = 11:00 min) as the colon was fully examined.     <<PROCEDUREIMAGES>>           FINDINGS:  A diminutive polyp was found in the descending colon.     Polyp was snared without cautery. Retrieval was successful. snare     polyp  Mild diverticulosis was found in the sigmoid colon.  This     was otherwise a normal examination of the colon. Random colon bx     to r/o microscopic colitis.   Retroflexed views in the rectum     revealed no abnormalities.    The scope was then withdrawn from  the patient and the procedure completed.           COMPLICATIONS:  None     ENDOSCOPIC IMPRESSION:     1) Diminutive polyp in the descending colon -removed     2) Mild diverticulosis in the sigmoid colon     3) Otherwise normal examination     RECOMMENDATIONS:     1) Await biopsy results     2) Follow up colonoscopy in 5 years     3) Zofran 4mg  ; #60; 1 po q 4-6 hrs prn nausea (6 refills)           ______________________________     Wilhemina Bonito. Eda Keys, MD           CC:  Candie Echevaria, MD Kathryne Sharper) ; The Patient           n.  eSIGNED:   Wilhemina Bonito. Eda Keys at 05/13/2009 03:06 PM           Vorndran, Jamela, Janz [id number]  Note: An exclamation mark (!) indicates a result that was not dispersed into the flowsheet. Document Creation Date: 05/13/2009 3:07 PM _______________________________________________________________________  (1) Order result status: Final Collection or observation date-time: 05/13/2009 14:59 Requested date-time:  Receipt date-time:  Reported date-time:  Referring Physician:   Ordering Physician: Fransico Setters [phone]) Specimen Source:  Source: Launa Grill Order Number: [id number] Lab site:   Appended Document: Colonoscopy recall in 5 yrs/04-2014/aw     Procedures Next Due Date:    Colonoscopy: 04/2014

## 2010-02-14 NOTE — Letter (Signed)
Summary: Colonoscopy Letter  [facility]  [address] [place], [place] [address]   Phone: [phone]  Fax: [phone]      January 21, 2009 MRN: [id number]   RYLIE LIMBURG [address] [place], [place]  [address]   Dear Ms. Picinich,   According to your medical record, it is time for you to schedule a Colonoscopy. The American Cancer Society recommends this procedure as a method to detect early colon cancer. Patients with a family history of colon cancer, or a personal history of colon polyps or inflammatory bowel disease are at increased risk.  This letter has beeen generated based on the recommendations made at the time of your procedure. If you feel that in your particular situation this may no longer apply, please contact our office.  Please call our office at [phone] to schedule this appointment or to update your records at your earliest convenience.  Thank you for cooperating with [place] to provide you with the very best care possible.   Sincerely,  Wilhemina Bonito. Marina Goodell, M.D.  [facility] [phone]

## 2010-02-14 NOTE — Progress Notes (Signed)
Summary: triage / pain / fvr / diarrhea   Phone Note From Other Clinic Call back at [phone]   Caller: [facility] Call For: Dr. Marina Goodell Reason for Call: Schedule Patient Appt Summary of Call: Dr. Candie Echevaria would like pt worked in asap for diverticulitis flare Initial call taken by: Vallarie Mare,  Jun 09, 2009 9:47 AM  Follow-up for Phone Call        I talked with Dr.Moore and pt. was seen by him yesterday with a low grade temp.100-100.5 more diarrhea and generalized abd. pain which has increased in intensity.Requesting Dr.Perry's advice on what  he wishs to be done next. Follow-up by: Teryl Lucy RN,  Jun 09, 2009 10:06 AM  Additional Follow-up for Phone Call Additional follow up Details #1::        It sounds like she needs to be seen Additional Follow-up by: Hilarie Fredrickson MD,  Jun 09, 2009 10:34 AM    Additional Follow-up for Phone Call Additional follow up Details #2::    Dr.Moore ntfd. that pt. needs to come for appt. with PA thiis afternoon at 2:30 p.m. Follow-up by: Teryl Lucy RN,  Jun 09, 2009 10:55 AM

## 2010-02-14 NOTE — Letter (Signed)
Summary: [facility] Cancel and No Reschedule  [facility]  [address] [place], [place] [address]   Phone: [phone]  Fax: [phone]      February 08, 2009 MRN: [id number]   Joan Robinson [address] [place], [place]  [address]     You recently cancelled your endoscopic procedure at the [facility] and did not reschedule for another date.    Your provider recommended this procedure for the benefit of your health.  It is very important that you reschedule it.  Failure to do so may be to the detriment of your health.  Please call us at [phone] and we will be happy to assist you with rescheduling.    If you were referred for this procedure by another physician/provider, we will notify him/her that you did not keep your appointment.   Sincerely,    [facility]

## 2010-03-09 ENCOUNTER — Ambulatory Visit: Payer: Self-pay | Admitting: Internal Medicine

## 2010-03-28 LAB — DIFFERENTIAL
Basophils Absolute: 0.2 10*3/uL — ABNORMAL HIGH (ref 0.0–0.1)
Lymphocytes Relative: 24 % (ref 12–46)
Neutro Abs: 4.7 10*3/uL (ref 1.7–7.7)

## 2010-03-28 LAB — BASIC METABOLIC PANEL
BUN: 9 mg/dL (ref 6–23)
Creatinine, Ser: 0.8 mg/dL (ref 0.4–1.2)
GFR calc non Af Amer: >60 mL/min (ref >60)
Potassium: 4.5 mEq/L (ref 3.5–5.1)

## 2010-03-28 LAB — CBC
HCT: 35.9 % — ABNORMAL LOW (ref 36.0–46.0)
Platelets: 203 10*3/uL (ref 150–400)
RDW: 11.3 % — ABNORMAL LOW (ref 11.5–15.5)
WBC: 7.3 10*3/uL (ref 4.0–10.5)

## 2010-03-30 LAB — COMPREHENSIVE METABOLIC PANEL
AST: 15 U/L (ref 0–37)
CO2: 26 mEq/L (ref 19–32)
Calcium: 8.8 mg/dL (ref 8.4–10.5)
Creatinine, Ser: 0.73 mg/dL (ref 0.4–1.2)
GFR calc Af Amer: >60 mL/min (ref >60)
GFR calc non Af Amer: >60 mL/min (ref >60)
Glucose, Bld: 84 mg/dL (ref 70–99)

## 2010-03-30 LAB — CBC
HCT: 28.9 % — ABNORMAL LOW (ref 36.0–46.0)
Hemoglobin: 10 g/dL — ABNORMAL LOW (ref 12.0–15.0)
Hemoglobin: 12.6 g/dL (ref 12.0–15.0)
MCH: 31.6 pg (ref 26.0–34.0)
MCHC: 34.4 g/dL (ref 30.0–36.0)
RBC: 3.15 MIL/uL — ABNORMAL LOW (ref 3.87–5.11)
WBC: 11.7 10*3/uL — ABNORMAL HIGH (ref 4.0–10.5)

## 2010-03-30 LAB — SURGICAL PCR SCREEN: Staphylococcus aureus: NEGATIVE

## 2010-03-30 IMAGING — CT CT ANGIO CHEST
3 of 7 series · 10 of 31 positions shown · IV contrast ([ID] OMNI 300)
Comparison: None

CLINICAL DATA: Chest pain and occasional shortness of breath.
Evaluate for pulmonary embolus.

CT ANGIOGRAPHY CHEST WITH CONTRAST
TECHNIQUE: Multidetector CT imaging of the chest was performed
using the standard protocol during bolus administration of
intravenous contrast. Multiplanar CT image reconstructions
including MIPs were obtained to evaluate the vascular anatomy.
Contrast: 125 ml Cmnipaque-CTT

[Series 6: pe thin 1.25 · axial · 0.64mm/px · z∈[-192,-28]mm · 6 of 197 slices shown]
[im 33/197  lung]
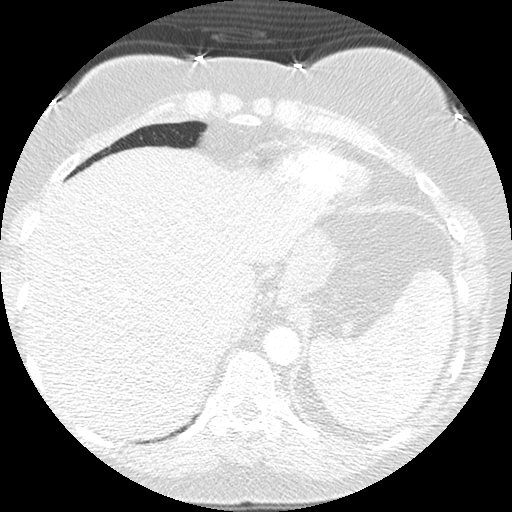
[im 66/197  mediastinal]
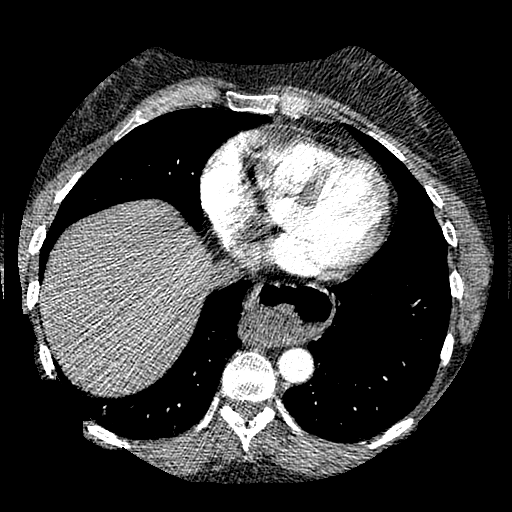
[im 99/197  lung]
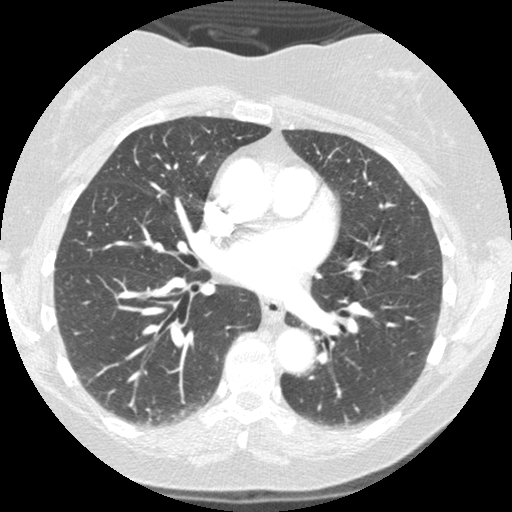
[im 108/197  mediastinal]
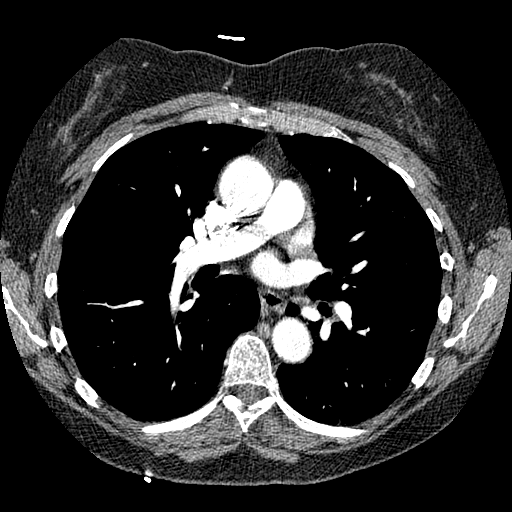
[im 131/197  lung]
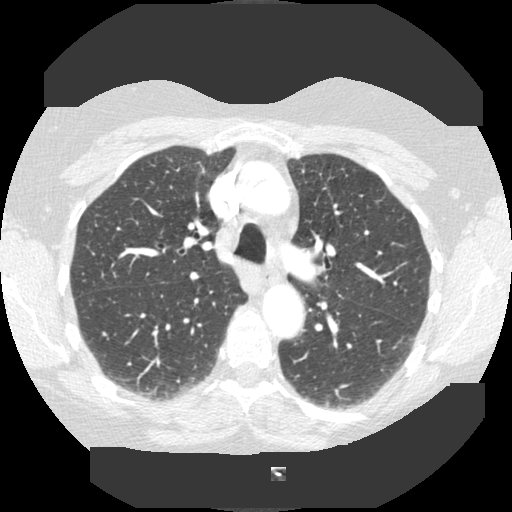
[im 164/197  mediastinal]
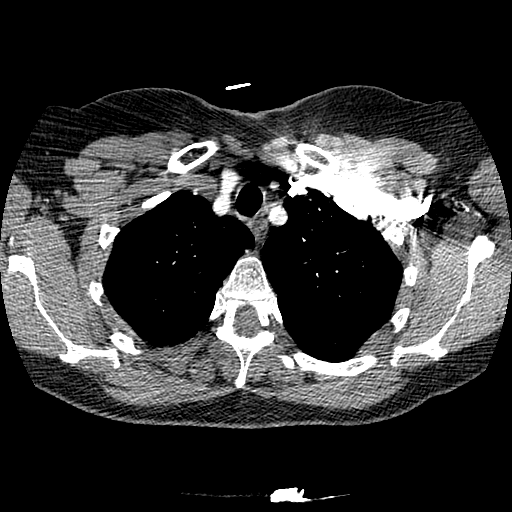

[Series 600: coronal · coronal · 0.64mm/px · 2 of 114 slices shown]
[im 38/114  lung]
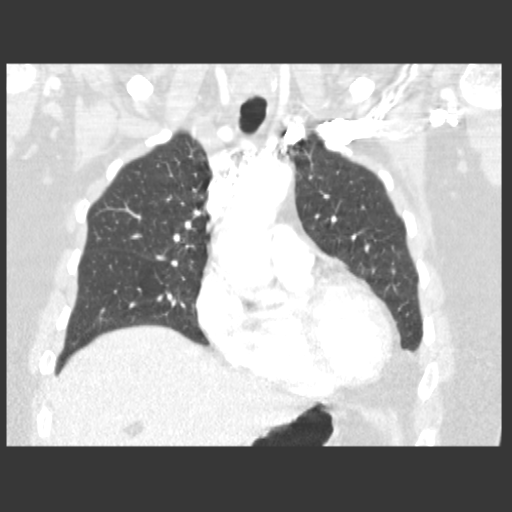
[im 76/114  lung]
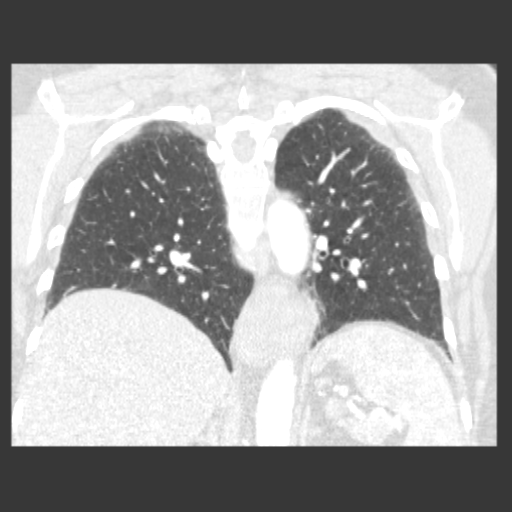

[Series 601: sagittal · sagittal · 0.64mm/px · 2 of 126 slices shown]
[im 42/126  lung]
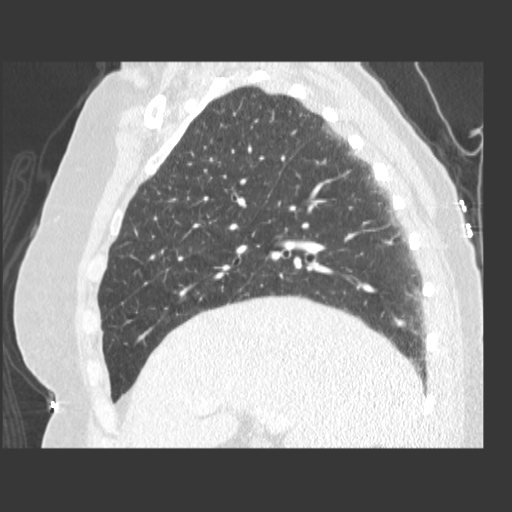
[im 84/126  lung]
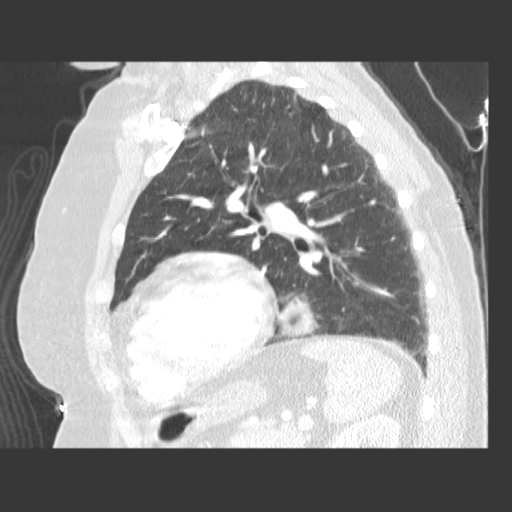

[10 of 31 positions shown; findings below may reference images not displayed]

FINDINGS: No pulmonary embolus.  No pathologically enlarged
mediastinal, hilar or axillary lymph nodes.  Heart size normal.  No
pericardial effusion.  Moderate hiatal hernia.  Lungs are
emphysematous.  Scarring is seen in the lingula.  Dependent
atelectasis or scarring is seen in the lower lobes.  Lungs are
otherwise clear.  No pleural fluid.  Airway is unremarkable.

Incidental imaging of the upper abdomen shows no acute findings.
No worrisome lytic or sclerotic lesions.

Review of the MIP images confirms the above findings.
IMPRESSION: No pulmonary embolus.  No acute findings in the chest.

## 2010-04-13 ENCOUNTER — Telehealth: Payer: Self-pay | Admitting: Internal Medicine

## 2010-04-13 DIAGNOSIS — R197 Diarrhea, unspecified: Secondary | ICD-10-CM

## 2010-04-13 NOTE — Telephone Encounter (Signed)
Pt states she had CDIFF last year. She just finished a Azithromycin 2 weeks ago for bronchitis. States she has had diarrhea all week and she thinks it is getting worse everyday. Pt reports that she has had four diarrhea stools today. Dr. Marina Goodell please advise.

## 2010-04-13 NOTE — Telephone Encounter (Signed)
Left message for pt to call back  °

## 2010-04-14 NOTE — Telephone Encounter (Signed)
1. Check stool for C.diff by PCR 2. Start Vancomycin 125 mg qid x 2 weeks w/ one refill. This is what worked before

## 2010-04-14 NOTE — Telephone Encounter (Signed)
Spoke with pt and she knows to come to the lab for the cdiff testing. Rx for Vancomycin called into [facility] [phone].

## 2010-04-17 NOTE — Telephone Encounter (Signed)
Okay 

## 2010-04-19 ENCOUNTER — Other Ambulatory Visit: Payer: Self-pay | Admitting: Orthopedic Surgery

## 2010-04-19 DIAGNOSIS — M545 Low back pain: Secondary | ICD-10-CM

## 2010-04-22 ENCOUNTER — Ambulatory Visit
Admission: RE | Admit: 2010-04-22 | Discharge: 2010-04-22 | Disposition: A | Discharge status: Home / Self Care | Payer: Managed Care, Other (non HMO) | Source: Ambulatory Visit | Attending: Orthopedic Surgery | Admitting: Orthopedic Surgery

## 2010-04-22 DIAGNOSIS — M545 Low back pain: Secondary | ICD-10-CM

## 2010-04-22 LAB — COMPREHENSIVE METABOLIC PANEL
ALT: 15 U/L (ref 0–35)
AST: 18 U/L (ref 0–37)
Alkaline Phosphatase: 64 U/L (ref 39–117)
CO2: 27 mEq/L (ref 19–32)
GFR calc Af Amer: >60 mL/min (ref >60)
Glucose, Bld: 90 mg/dL (ref 70–99)
Potassium: 3.5 mEq/L (ref 3.5–5.1)
Sodium: 138 mEq/L (ref 135–145)
Total Protein: 7 g/dL (ref 6.0–8.3)

## 2010-04-22 LAB — DIFFERENTIAL
Basophils Relative: 1 % (ref 0–1)
Eosinophils Absolute: 0 10*3/uL (ref 0.0–0.7)
Eosinophils Relative: 1 % (ref 0–5)
Lymphs Abs: 2.9 10*3/uL (ref 0.7–4.0)
Monocytes Relative: 7 % (ref 3–12)
Neutrophils Relative %: 54 % (ref 43–77)

## 2010-04-22 LAB — URINALYSIS, ROUTINE W REFLEX MICROSCOPIC
Glucose, UA: NEGATIVE mg/dL
Specific Gravity, Urine: 1.012 (ref 1.005–1.030)
pH: 8 (ref 5.0–8.0)

## 2010-04-22 LAB — CBC
Hemoglobin: 12.9 g/dL (ref 12.0–15.0)
RBC: 4.09 MIL/uL (ref 3.87–5.11)
RDW: 12.9 % (ref 11.5–15.5)

## 2010-04-22 LAB — URINE MICROSCOPIC-ADD ON

## 2010-04-22 LAB — APTT: aPTT: 27 seconds (ref 24–37)

## 2010-04-22 LAB — HEMOCCULT GUIAC POC 1CARD (OFFICE): Fecal Occult Bld: NEGATIVE

## 2010-04-22 LAB — LACTIC ACID, PLASMA: Lactic Acid, Venous: 0.8 mmol/L (ref 0.5–2.2)

## 2010-04-22 LAB — URINE CULTURE

## 2010-04-22 LAB — PROTIME-INR: INR: 1 (ref 0.00–1.49)

## 2010-04-23 LAB — CARDIAC PANEL(CRET KIN+CKTOT+MB+TROPI)
Relative Index: 0.6 (ref 0.0–2.5)
Troponin I: 0.01 ng/mL (ref 0.00–0.06)

## 2010-04-23 LAB — BASIC METABOLIC PANEL
BUN: 6 mg/dL (ref 6–23)
CO2: 24 mEq/L (ref 19–32)
Chloride: 110 mEq/L (ref 96–112)
Creatinine, Ser: 0.89 mg/dL (ref 0.4–1.2)
Glucose, Bld: 151 mg/dL — ABNORMAL HIGH (ref 70–99)
Potassium: 4.1 mEq/L (ref 3.5–5.1)

## 2010-04-23 LAB — TROPONIN I: Troponin I: 0.01 ng/mL (ref 0.00–0.06)

## 2010-04-23 LAB — POCT CARDIAC MARKERS
Myoglobin, poc: 25.9 ng/mL (ref 12–200)
Myoglobin, poc: 32.8 ng/mL (ref 12–200)
Troponin i, poc: <0.05 ng/mL (ref 0.00–0.09)

## 2010-04-23 LAB — DIFFERENTIAL
Basophils Relative: 1 % (ref 0–1)
Eosinophils Absolute: 0 10*3/uL (ref 0.0–0.7)
Eosinophils Relative: 1 % (ref 0–5)
Lymphs Abs: 1.6 10*3/uL (ref 0.7–4.0)
Neutrophils Relative %: 64 % (ref 43–77)

## 2010-04-23 LAB — CBC
HCT: 39.4 % (ref 36.0–46.0)
MCHC: 34.7 g/dL (ref 30.0–36.0)
MCV: 91.2 fL (ref 78.0–100.0)
Platelets: 172 10*3/uL (ref 150–400)
RDW: 12.7 % (ref 11.5–15.5)

## 2010-04-23 LAB — CK TOTAL AND CKMB (NOT AT ARMC): Total CK: 119 U/L (ref 7–177)

## 2010-04-24 LAB — CBC
HCT: 37.4 % (ref 36.0–46.0)
Hemoglobin: 12.9 g/dL (ref 12.0–15.0)
MCHC: 34.2 g/dL (ref 30.0–36.0)
MCHC: 34.4 g/dL (ref 30.0–36.0)
MCV: 91.8 fL (ref 78.0–100.0)
MCV: 91.8 fL (ref 78.0–100.0)
Platelets: 153 10*3/uL (ref 150–400)
RBC: 3.74 MIL/uL — ABNORMAL LOW (ref 3.87–5.11)
RBC: 4.08 MIL/uL (ref 3.87–5.11)
WBC: 4.7 10*3/uL (ref 4.0–10.5)

## 2010-04-24 LAB — COMPREHENSIVE METABOLIC PANEL
BUN: 4 mg/dL — ABNORMAL LOW (ref 6–23)
CO2: 28 mEq/L (ref 19–32)
Calcium: 9.1 mg/dL (ref 8.4–10.5)
Creatinine, Ser: 0.83 mg/dL (ref 0.4–1.2)
GFR calc Af Amer: >60 mL/min (ref >60)
GFR calc non Af Amer: >60 mL/min (ref >60)
Glucose, Bld: 103 mg/dL — ABNORMAL HIGH (ref 70–99)
Total Bilirubin: 0.4 mg/dL (ref 0.3–1.2)

## 2010-04-24 LAB — HEPARIN LEVEL (UNFRACTIONATED): Heparin Unfractionated: 0.61 IU/mL (ref 0.30–0.70)

## 2010-04-24 LAB — CARDIAC PANEL(CRET KIN+CKTOT+MB+TROPI)
CK, MB: 0.8 ng/mL (ref 0.3–4.0)
Total CK: 124 U/L (ref 7–177)
Total CK: 137 U/L (ref 7–177)
Troponin I: 0.01 ng/mL (ref 0.00–0.06)

## 2010-04-24 LAB — DIFFERENTIAL
Basophils Absolute: 0 10*3/uL (ref 0.0–0.1)
Lymphocytes Relative: 37 % (ref 12–46)
Lymphs Abs: 1.9 10*3/uL (ref 0.7–4.0)
Neutro Abs: 2.9 10*3/uL (ref 1.7–7.7)
Neutrophils Relative %: 57 % (ref 43–77)

## 2010-04-24 LAB — PROTIME-INR: Prothrombin Time: 13.5 seconds (ref 11.6–15.2)

## 2010-04-24 LAB — MAGNESIUM: Magnesium: 2.2 mg/dL (ref 1.5–2.5)

## 2010-04-24 LAB — APTT: aPTT: 29 seconds (ref 24–37)

## 2010-04-24 LAB — TSH: TSH: 1.229 u[IU]/mL (ref 0.350–4.500)

## 2010-05-09 ENCOUNTER — Telehealth: Payer: Self-pay | Admitting: Internal Medicine

## 2010-05-10 NOTE — Telephone Encounter (Signed)
Spoke with pt and she states that she has finished taking the vancomycin but when she came to drop off her stool specimen for cdiff we were closed, it was good Jun 02, 2022. Then her mother passed away so she still has not given a sample for cdiff. Pt states she is still feeling bad and wants to know if she should come in and give the specimen or should she try a probiotic. Dr. Marina Goodell please advise.

## 2010-05-10 NOTE — Telephone Encounter (Signed)
Pt states that the vancomycin did not really help, she is still having diarrhea. Pt instructed to bring in the stool for cdiff and given and appt to see Amy Esterwood PA 05/12/10@3 :30pm. Pt aware of appt date and time

## 2010-05-10 NOTE — Telephone Encounter (Signed)
If vancomycin helped, she needs treated with an additional course. If still having diarrhea, she should come in for C. difficile by PCR stool study. See if she can be worked in with an extender this week. Thanks

## 2010-05-12 ENCOUNTER — Ambulatory Visit: Payer: Managed Care, Other (non HMO) | Admitting: Physician Assistant

## 2010-05-29 ENCOUNTER — Telehealth: Payer: Self-pay | Admitting: Internal Medicine

## 2010-05-29 NOTE — Telephone Encounter (Signed)
Have her see Amy Wednesday morning, I am the supervising physician.

## 2010-05-29 NOTE — Telephone Encounter (Signed)
Pt states that she has noticed bright red blood about 3 times recently when she wiped following a bowel movement. Pt states that it is bright red blood. Yesterday pt reports that she was walking and felt something wet, went to the bathroom and just had some bright red blood when she wiped, she did not have a bowel movement that time. She also reports that she also had some cramping with this episode. Dr. Marina Goodell please advise.

## 2010-05-29 NOTE — Telephone Encounter (Signed)
Pt scheduled to see Amy Esterwood PA 05/31/10@11am , Pt aware of appt date and time.

## 2010-05-30 NOTE — Assessment & Plan Note (Signed)
[facility]                         GASTROENTEROLOGY OFFICE NOTE   NAME:Joan Robinson, Joan Robinson                       MRN:          [id number]  DATE:02/11/2007                            DOB:          Apr 23, 1960    REASON FOR CONSULTATION:  Nausea, vomiting, and loose stools.   HISTORY OF PRESENT ILLNESS:  This is a 50 year old white female with a  history of hyperlipidemia, hypothyroidism, sleep apnea, and adenomatous  colon polyps for which she underwent colonoscopy in January of 2006.  Examination at that time was essentially normal with no polyps seen.  She presents now with a six-month history of problems with morning  nausea with intermittent vomiting.  She does have reflux symptoms like  heartburn and indigestion.  She has also been having some problems with  pressure in her chest and belching for which she saw a cardiologist.  Her symptoms were felt to be noncardiac and a proton pump inhibitor was  recommended.  She has used a proton pump inhibitor though it has been  very sporadic.  She denies hematemesis or dysphagia.  She has also  noticed loose stools occurring several times daily.  There is some  abdominal discomfort and cramping and some urgency.  She states that she  has had a tendency toward irritable bowel type symptoms many years ago  which is brought on by stress.  She has been under a lot of stress  recently.  She also states that she has had decreased appetite and 10  pound weight loss over the past six months.  Of interest, she has had  some difficulty regulating her thyroid.  Testing has indicated elevated  as well as low TSH for which her Synthroid has been adjusted.  She feels  that as her thyroid has come under better control, this past month, her  symptoms, particular lower GI symptoms, have been better.  She now  reports symptoms that were daily are now occurring 2-3 times per week.  She has had no melena or hematochezia.  Stools  have been occasionally  dark, but this is usually after taking Pepto-Bismol for diarrhea.  She  has not tried Imodium.  She does state that she has had gastric ulcers  in the remote past.   PAST MEDICAL HISTORY:  As above.   PAST SURGICAL HISTORY:  1. Hysterectomy.  2. Tubal ligation.   ALLERGIES:  Intolerant to ASPIRIN (said to cause ulcer).   CURRENT MEDICATIONS:  1. Vitamin C.  2. Vitamin E.  3. Calcium.  4. Synthroid 125 mcg daily.  5. Lovastatin 40 mg daily.  6. Midrin p.r.n.   FAMILY HISTORY:  Uncle with colon cancer.  The patient herself has colon  polyps as does her father.  Mother with heart disease.  Grandfather with  diabetes.   SOCIAL HISTORY:  The patient is married with two children.  She has a  twelfth grade education.  She works in Aeronautical engineer.  She smokes,  but does not use alcohol.   REVIEW OF SYSTEMS:  Multiple entries for diagnostic evaluation form.   PHYSICAL EXAMINATION:  GENERAL:  Pleasant, though somewhat depressed  appearing female in no acute distress.  VITAL SIGNS:  Blood pressure 138/86, heart rate 88, weight 180.4 pounds.  She is 5 feet 8-1/2 inches in height.  HEENT:  Sclerae anicteric.  Conjunctivae are pink.  Oral mucosa is  intact.  There is no thrush and no adenopathy.  LUNGS:  Clear.  HEART:  Regular.  ABDOMEN:  Soft without tenderness, mass, or hernia.  Good bowel sounds  heard.  EXTREMITIES:  Without edema.   IMPRESSION:  1. Problems with nausea, particularly in the morning, and vomiting.      This is most likely due to reflux disease.  Rule out occult      gallbladder disease.  2. Increased frequency of bowel movements.  Possibly due to      unregulated thyroid.  Also, could be a component of irritable      bowel.  Essentially negative colonoscopy three years ago.  3. History of colon polyps.  4. General medical problems as outlined above.   RECOMMENDATIONS:  1. Daily proton pump inhibitor.  Protonix 40 mg daily.  Multiple      samples have been provided.  2. Schedule abdominal ultrasound to rule out gallstones.  3. Schedule upper endoscopy to evaluate nausea, vomiting, and pain.  4. Okay to use Imodium p.r.n. for loose stools.  5. Ongoing management of thyroid disease per Dr. Everardo All.     Wilhemina Bonito. Marina Goodell, MD  Electronically Signed    JNP/MedQ  DD: 02/11/2007  DT: 02/12/2007  Job #: [id number]   cc:   Gregary Signs A. Everardo All, MD

## 2010-05-30 NOTE — Discharge Summary (Signed)
Joan Robinson, Joan Robinson                ACCOUNT NO.:  [id number]   MEDICAL RECORD NO.:  [id number]          PATIENT TYPE:  INP   LOCATION:  3714                         FACILITY:  [facility]   PHYSICIAN:  Ricki Rodriguez, M.D.  DATE OF BIRTH:  1960/04/04   DATE OF ADMISSION:  06/23/2008  DATE OF DISCHARGE:  06/24/2008                               DISCHARGE SUMMARY   The patient admitted by Dr. Orpah Cobb.   FINAL DIAGNOSES:  1. Chest pain, probably noncardiac.  2. Anxiety.  3. Hiatal hernia.   DISCHARGE MEDICATIONS:  1. Synthroid 100 mcg 1 daily.  2. Metoprolol 25 mg half daily.  3. Cardizem 30 mg 1 twice daily.  4. Prilosec 20 mg daily.  5. Crestor 10 mg half a tablet daily.  6. Multivitamin 1 daily.  7. B12, 500 mcg daily.  8. Ferrous sulfate 1 daily.  9. Vitamin E 400 units 1 daily.  10.Calcium with vitamin D 1000 units daily.  11.Alprazolam 0.25 mg 1 daily.  12.Nitroglycerin 0.4 mg 1 sublingual every 5 minutes x3 as needed for      chest pain.  13.Plavix 75 mg 1 daily.   DISCHARGE DIET:  Low-sodium, heart-healthy diet.   DISCHARGE ACTIVITY:  The patient is to increase activity slowly.   SPECIAL INSTRUCTIONS:  The patient is to stop any activity that causes  chest pain, shortness of breath, dizziness, sweating, or excessive  weakness.   FOLLOWUP:  Dr. Orpah Cobb.  The patient is to [phone] for  appointment.   HISTORY:  This 50 year old white female presented with a 1-day history  of chest pain, nausea, and sweating.   PHYSICAL EXAMINATION:  VITAL SIGNS:  Temperature 98.1, pulse 82,  respirations 18, blood pressure 124/72.  GENERAL:  The patient is well-built, well-nourished white female, in no  acute distress.  HEENT:  The patient is normocephalic, atraumatic with pupils equally  reacting to light.  Extraocular movement intact.  Sclerae clear.  NECK:  Supple.  LUNGS:  Clear.  HEART:  Normal S1 and S2.  ABDOMEN:  Soft and nontender.  EXTREMITIES:  No edema,  cyanosis, or clubbing.  NEUROLOGIC:  Cranial nerves grossly intact, and the patient is alert and  oriented x3.   LABORATORY DATA:  Normal hemoglobin/hematocrit, WBC count, platelet  count.  Normal electrolytes, BUN, creatinine.  Normal PT/INR.  Normal CK-  MB, troponin I x3.  Nuclear stress test without any reversible ischemia.   HOSPITAL COURSE:  The patient was admitted to telemetry unit.  Myocardial infarction was ruled out.  She underwent nuclear stress test  that failed to show any reversible ischemia.  Her medications were  adjusted, and she was discharged home in satisfactory condition with  follow up by me in 1 week.      Ricki Rodriguez, M.D.  Electronically Signed     ASK/MEDQ  D:  08/03/2008  T:  08/04/2008  Job:  [id number]

## 2010-05-30 NOTE — Assessment & Plan Note (Signed)
[facility]                            CARDIOLOGY OFFICE NOTE   NAME:Joan Robinson, Joan Robinson                       MRN:          [id number]  DATE:10/04/2006                            DOB:          08-01-1960    REASON FOR CONSULTATION:  Evaluate patient with chest discomfort.   HISTORY OF PRESENT ILLNESS:  Patient is a pleasant 50 year old female.  She had a history of chest discomfort and as stress perfusion study in  2003.  She had an EF of 79% with no evidence of ischemia or infarct.  However, over the past month she has had increasing chest discomfort.  This seems to be sporadic.  It happens mostly with emotional stress.  She can do activities such as push a lawnmower, and has not noticed  this.  It is a substernal discomfort.  It is tightness.  She thinks that  Xanax may help a little bit, but it does not make it go away.  It is  6/10 in intensity.  There has been radiation to her jaw or to her arms.  There has been no associated nausea, vomiting, or diaphoresis.  She gets  some headaches, neck pain, and nausea unrelated to this.  She has some  chronic dyspnea, but does not describe any new shortness of breath, PND,  or orthopnea.  She has not had any new palpitations, presyncope, or  syncope.   PAST MEDICAL HISTORY:  1. Mild dyslipidemia.  She has never had a history of hypertension or      diabetes.  2. She is hypothyroid (she recently had a 0.3 TSH, and is having her      thyroid adjusted downward).   PAST SURGICAL HISTORY:  1. Tubal ligation.  2. Hysterectomy.   ALLERGIES:  1. ASPIRIN.  2. FLAGYL.  3. SEPTRA.   MEDICATIONS:  1. Synthroid 75 mcg daily.  2. Oxybutynin.  3. Vicodin.  4. Midrin.  5. Vitamin E.  6. Bactroban.  7. Xanax.   SOCIAL HISTORY:  The patient is in customer service.  She is married.  She has 2 children who live with her.  She has grandchildren who live  with her as well.  She has a disabled husband she takes  care of.  Her  mother is ill.  She smokes at least 1/2 pack a day of cigarettes for 30  years.   FAMILY HISTORY:  Noncontributory for early coronary disease.  Her  mother, later on, had heart disease.  Her father died of an aortic  dissectionn, apparently.   REVIEW OF SYSTEMS:  Positive for headaches and dizziness, back pain,  ulcer.  Negative for other systems.   PHYSICAL EXAMINATION:  The patient is in no distress.  She does appear  anxious.  Blood pressure 136/72.  Heart rate 90 and regular.  Weight 182 pounds.  Body mass index 27.  HEENT:  Eyes unremarkable.  Pupils equal, round, and reactive to light.  Fundi within normal limits.  Oral mucosa unremarkable.  NECK:  No jugular venous distention at 45 degrees.  Carotid upstroke  brisk and symmetric.  No bruits.  No thyromegaly.  LYMPHATICS:  No cervical, axillary, or inguinal adenopathy.  LUNGS:  Clear to auscultation bilaterally.  BACK:  No costovertebral angle tenderness.  CHEST:  Unremarkable.  HEART:  PMI not displaced or sustained.  S1 and S2 within normal limits.  No S3.  No S4.  No clicks.  No rubs.  No murmurs.  ABDOMEN:  Flat.  Positive.  Normal in frequency and pitch.  No bruits.  No rebound.  No guarding.  No midline pulsatile mass.  No hepatomegaly.  No splenomegaly.  SKIN:  No rashes.  No nodules.  EXTREMITIES:  Two plus pulses throughout.  No edema.  No cyanosis.  No  clubbing.  NEUROLOGIC:  Oriented to person, place, and time.  Cranial nerves 2  through 12 grossly intact.  Motor grossly intact.  EKG:  Sinus rhythm.  Rate 84.  Axis within normal limits.  Intervals  within normal limits.  Poor anterior R wave progression.  Low voltage in  the limb leads (this is not different than the EKG from July 02, 2005).   ASSESSMENT AND PLAN:  1. Chest.  The patient's chest has some worrisome features, but some      atypical features.  She does have smoking history, and some      dyslipidemia.  Given this, the pretest  probability of obstructive      coronary disease is moderate.  She needs a screening with an      exercise perfusion study.  Further evaluation will be based on      these results.  2. Tobacco.  We did discuss the need to stop smoking.  She has so much      stress in her life that she does not think she can do this.  She      understands the importance of this.  3. Dyslipidemia.  We discussed dietary changes and exercise to improve      her lipid profile.  She does not have an indication for lipid      medication at this point.  4. Followup.  I will see the patient back based on the results of the      above.    Rollene Rotunda, MD, [facility]  Electronically Signed   JH/MedQ  DD: 10/04/2006  DT: 10/04/2006  Job #: [id number]   cc:   Gregary Signs A. Everardo All, MD

## 2010-05-30 NOTE — Discharge Summary (Signed)
NAMEBEOLA, VASALLO                ACCOUNT NO.:  [id number]   MEDICAL RECORD NO.:  [id number]          PATIENT TYPE:  OBV   LOCATION:  4733                         FACILITY:  [facility]   PHYSICIAN:  Ricki Rodriguez, M.D.  DATE OF BIRTH:  10-19-1960   DATE OF ADMISSION:  07/15/2008  DATE OF DISCHARGE:  07/16/2008                               DISCHARGE SUMMARY   FINAL DIAGNOSES:  1. Noncardiac chest pain.  2. Anxiety.  3. Hiatal hernia.  4. Dyslipidemia.  5. Hypothyroidism.   DISCHARGE MEDICATIONS:  1. Synthroid 100 mcg 1 daily.  2. Vitamin D 1000 units daily.  3. Metoprolol 12.5 mg daily.  4. B12 injection once a week as directed.  5. Xanax 0.25 mg 1 daily.  6. Vicodin 5/500 mg 1 twice daily as needed.  7. Zegerid 20 mg 1 daily, may convert to over-the-counter Prilosec.  8. Nitroglycerin 0.4 mg once sublingual every 5 minutes x3 as needed      for chest pain.  9. Crestor 10 mg daily.  10.Singulair 10 mg 1 daily as needed.  11.Vitamin E 400 international units daily.   WOUND CARE INSTRUCTION:  The patient to notify right groin pain swelling  or discharge.   DISCHARGE DIET:  Low-sodium, heart-healthy diet.   DISCHARGE ACTIVITY:  The patient to increase activity slowly.   FOLLOWUP:  Followup by Dr. Orpah Cobb in 2-4 weeks.  The patient to  call [phone] for appointment.   HISTORY:  This 50 year old white female complained of recurrent chest  pain in spite of unremarkable stress test.  The patient complained of  fullness behind the sternum and a burning type pain in spite of  sublingual nitroglycerin intake.   PHYSICAL EXAMINATION:  VITAL SIGNS:  Temperature 98, pulse 80,  respirations 18, and blood pressure 120/70.  GENERAL:  The patient is well-built, well-nourished white female in no  acute distress.  HEENT:  The patient is normocephalic, atraumatic with pupils equally  reactive to light.  Extraocular movement intact.  NECK:  Supple.  LUNGS:  Clear bilaterally.  HEART:  Normal S1 and S2.  ABDOMEN:  Soft and nontender.  EXTREMITIES:  No edema, cyanosis, or clubbing.  NEUROLOGIC:  Cranial nerves grossly intact.  The patient is alert and  oriented x3.   LABORATORY DATA:  Normal hemoglobin/hematocrit, WBC count, platelet  count.  Normal electrolytes, BUN, creatinine.   Cardiac catheterization showed normal coronaries and normal LV systolic  function.   HOSPITAL COURSE:  The patient was admitted to telemetry unit because of  recurrent chest pain.  She underwent cardiac catheterization.  This  failed to show any coronary artery disease and she had good normal LV  systolic function.  Hence, she was discharged home in satisfactory  condition with followup by me in 2-4 weeks.      Ricki Rodriguez, M.D.  Electronically Signed     ASK/MEDQ  D:  08/30/2008  T:  08/31/2008  Job:  [id number]

## 2010-05-31 ENCOUNTER — Ambulatory Visit (INDEPENDENT_AMBULATORY_CARE_PROVIDER_SITE_OTHER): Payer: Managed Care, Other (non HMO) | Admitting: Physician Assistant

## 2010-05-31 ENCOUNTER — Encounter: Payer: Self-pay | Admitting: Physician Assistant

## 2010-05-31 ENCOUNTER — Other Ambulatory Visit: Payer: Managed Care, Other (non HMO)

## 2010-05-31 ENCOUNTER — Other Ambulatory Visit (INDEPENDENT_AMBULATORY_CARE_PROVIDER_SITE_OTHER): Payer: Managed Care, Other (non HMO)

## 2010-05-31 DIAGNOSIS — R197 Diarrhea, unspecified: Secondary | ICD-10-CM

## 2010-05-31 DIAGNOSIS — K625 Hemorrhage of anus and rectum: Secondary | ICD-10-CM

## 2010-05-31 LAB — CBC WITH DIFFERENTIAL/PLATELET
Basophils Absolute: 0 10*3/uL (ref 0.0–0.1)
Eosinophils Absolute: 0.1 10*3/uL (ref 0.0–0.7)
Eosinophils Relative: 2.4 % (ref 0.0–5.0)
Lymphs Abs: 1.9 10*3/uL (ref 0.7–4.0)
MCV: 88.6 fl (ref 78.0–100.0)
Monocytes Absolute: 0.4 10*3/uL (ref 0.1–1.0)
Neutrophils Relative %: 46.6 % (ref 43.0–77.0)
Platelets: 191 10*3/uL (ref 150.0–400.0)
RDW: 12.7 % (ref 11.5–14.6)
WBC: 4.5 10*3/uL (ref 4.5–10.5)

## 2010-05-31 MED ORDER — GLYCOPYRROLATE 2 MG PO TABS: ORAL_TABLET | ORAL | Status: DC

## 2010-05-31 MED ORDER — SACCHAROMYCES BOULARDII 250 MG PO CAPS: 250.0000 mg | ORAL_CAPSULE | Freq: Two times a day (BID) | ORAL | Status: AC

## 2010-05-31 MED ORDER — SACCHAROMYCES BOULARDII 250 MG PO CAPS: 250.0000 mg | ORAL_CAPSULE | Freq: Two times a day (BID) | ORAL | Status: DC

## 2010-05-31 NOTE — Progress Notes (Signed)
Subjective:    Patient ID: Joan Robinson, female    DOB: 05-Oct-1960, 50 y.o.   MRN: [id number]  HPI Joan Robinson is a very nice 50 year old white female known to Dr.Perry who has history of diverticular disease and adenomatous colon polyps. She had an episode of C. difficile colitis in May of 2011 which relapsed and was treated with vancomycin. At this time she comes back and stating that she had onset of diarrhea again towards the end of March. She had taken a course of an antibiotic for bronchitis earlier in the month. She had called here and C. difficile PCR was recommended and a two-week course of vancomycin was restarted. Unfortunately during that time her mother was ill and passed away and she did not bring in the stool for culture. She says she felt better with the vancomycin and her diarrhea improved but hasn't not completely resolved. Apparently at its worst she was having 5 or 6 diarrheal stools per day and now over the past several weeks she continues to have 2-3 diarrheal stools per day. In association she has also been experiencing urgency increase in the gas, intermittent mild abdominal cramping, especially postprandially. She's had some vague nausea but says overall her appetite is much better than it was last times when she had the C. difficile. She does endorse that her stools have been quite malodorous. Patient has noted on 4 or 5 occasions over the past couple of months an episode of bright red blood noted on the toilet tissue. This is not necessarily been associated with bowel movements and she has not noted any blood mixed in with her stools. She has no complaint of a rectal pain pressure or other hemorrhoidal symptoms. The last episode was Sunday. She says that she felt a little bit of leakage went to the bathroom and when she wiped noticed his bright red blood. She has not had noted any bleeding since. She is frustrated because she started having ongoing bowel problems. She tried to advance  he'll previously but he died not tolerate due to a side effect of mouth sores. She has been taking a generic probiotic.    Review of Systems  Constitutional: Negative.   HENT: Negative.   Eyes: Negative.   Respiratory: Negative.   Cardiovascular: Negative.   Gastrointestinal: Positive for diarrhea and anal bleeding.  Musculoskeletal: Negative.   Skin: Negative.   Neurological: Negative.   Hematological: Negative.   Psychiatric/Behavioral: Negative.        Objective:   Physical Exam well-developed white female in no acute distress, pleasant, alert and oriented x3 HEENT nontraumatic normocephalic EOMI PERRLA sclera anicteric, Neck; supple no JVD  Cardiovascular; regular rate and rhythm with S1-S2 no murmur rub or gallop, Pulmonary; clear bilaterally  Abdomen; soft nondistended mild rather generalized tenderness bowel sounds active no guarding or rebound no palpable mass or hepatosplenomegaly. She does have some tenderness over the left costal margin as well , Rectal; no stool in rectal vault mucus Hemoccult negative there is no external anal lesion or hemorrhoids; no hemorrhoids visualized on anoscopy. Ski;n warm and dry benign; Psych; mood and affect normal an appropriate.        Assessment & Plan:  #76 50 year old female with history of C. difficile colitis 2011 him in now with 6 week history of recurrent diarrhea mild abdominal cramping and urgency. Empirically treated with vancomycin x2 weeks about one month ago with improvement but no resolution of symptoms. Rule out persistent/recurrent C. difficile colitis., Rule out postinfectious  IBS.  Plan; Check stool for C. difficile PCR and lactoferrin Check CBC and CRP Start florastor  one by mouth twice daily Start trial of Robinul Forte 2 mg once daily in a.m. Hold on antibiotics until stool studies return.  #2 Small-volume intermittent hematochezia, likely hemorrhoidal or local anorectal irritation - Hemoccult-negative today with  negative anoscopy.  Plan; Reassurance, and observation for now  #3 History of adenomatous colon polyps Last colonoscopy April 2011.

## 2010-05-31 NOTE — Progress Notes (Signed)
Agree with assessment and plans 

## 2010-05-31 NOTE — Patient Instructions (Signed)
Please go to the basement level to have your labs drawn.  We sent prescriptions for Robinul Forte and Florastor Probiotic to [facility]. We will call you with the lab results.

## 2010-06-01 ENCOUNTER — Telehealth: Payer: Self-pay | Admitting: *Deleted

## 2010-06-01 LAB — FECAL LACTOFERRIN, QUANT: Lactoferrin: POSITIVE

## 2010-06-01 LAB — CLOSTRIDIUM DIFFICILE BY PCR: Toxigenic C. Difficile by PCR: DETECTED — CR

## 2010-06-01 NOTE — Telephone Encounter (Signed)
[facility] called, positive Cdiff on this patient. Per Willette Cluster ACNP, she prescribed the Vancomycin 250 MG. 4 times daily x 14 days= 56 tablets. Amy Esterwood PA added to this a taper dosage of: 250 MG 3 times daily x 7 days = 21 tablets. 250 MG twice daily x 7 days = 14 tablets Called [facility] and phoned in the prescription, they are faxing me the Prior Authorization form to call [employer]. Will call [employer] to do the prior authorization.

## 2010-06-02 NOTE — H&P (Signed)
NAMEELENORE, WANNINGER                ACCOUNT NO.:  [id number]   MEDICAL RECORD NO.:  [id number]          PATIENT TYPE:  AMB   LOCATION:  SDC                           FACILITY:  [facility]   PHYSICIAN:  Huel Cote, M.D. DATE OF BIRTH:  Sep 13, 1960   DATE OF ADMISSION:  03/31/2004  DATE OF DISCHARGE:                                HISTORY & PHYSICAL   HISTORY OF PRESENT ILLNESS:  The patient is a 50 year old G2, P2, who is  scheduled to undergo a hysteroscopy and Novasure ablation should her cavity  prove amenable to that.  She is a patient who began to have problems with  abnormal bleeding approximately 3 months ago, at which point she began to  bleed almost daily; prior to that, she was having periods every 14 days for  6-8 months.  The patient has been evaluated with an endometrial biopsy which  was normal and also a saline-infusion ultrasound which demonstrated probable  2 polyps in the endometrial cavity; there was also some question of a  possible small fibroid.  She has always had pretty heavy periods; however,  the frequency of these increasing has become most bothersome and for this  reason, she wishes to proceed with treatment.   PAST MEDICAL HISTORY:  Hypothyroidism.   PAST SURGICAL HISTORY:  Tubal ligation and a LEEP.   PAST OBSTETRICAL HISTORY:  Normal spontaneous vaginal delivery x2.   PAST GYNECOLOGICAL HISTORY:  The history of the LEEP with normal Pap smears  after that point.   FAMILY HISTORY:  She has no breast cancer.  There is colon cancer in an  uncle and a father, and a mother with heart disease.   MEDICATIONS:  Medications include Synthroid and Aygestin daily.   ALLERGIES:  ASPIRIN sensitivity and SEPTRA.   PHYSICAL EXAMINATION:  VITAL SIGNS:  On physical exam, the patient's weight  is 186 pounds, blood pressure 132/85.  CARDIAC:  Regular rate and rhythm.  LUNGS:  Clear to auscultation bilaterally.  ABDOMEN:  Abdomen is soft and nontender.  PELVIC:  She  has normal genitalia.  Her cervix had no lesions.  Uterus was  upper limits of normal and anteverted and her adnexa have no masses.   ASSESSMENT AND PLAN:  The patient was counseled as to the risks and benefits  of proceeding with this procedure including bleeding, infection and possible  uterine perforation.  She understands that this is for removal of polyps by  hysteroscopy and should the cavity appear amenable, we will proceed with  Novasure ablation to receive  from benefit overall for her bleeding pattern.  The patient is a smoker of  approximately and a half a pack to a pack a day and therefore is not a  candidate for oral contraceptive therapy to manage her bleeding.  She has  been on Aygestin with control of the bleeding flow, however, has continued  to have daily light bleeding.      KR/MEDQ  D:  03/29/2004  T:  03/29/2004  Job:  [id number]

## 2010-06-02 NOTE — Op Note (Signed)
NAMEVONNETTA, Joan Robinson                ACCOUNT NO.:  [id number]   MEDICAL RECORD NO.:  [id number]          PATIENT TYPE:  AMB   LOCATION:  SDC                           FACILITY:  [facility]   PHYSICIAN:  Huel Cote, M.D. DATE OF BIRTH:  12/24/1960   DATE OF PROCEDURE:  03/31/2004  DATE OF DISCHARGE:                                 OPERATIVE REPORT   PREOPERATIVE DIAGNOSIS:  As abnormal uterine bleeding.   POSTOPERATIVE DIAGNOSIS:  As abnormal uterine bleeding with a small  endometrial polyp.   PROCEDURE:  Diagnostic hysteroscopy with curettage and NovaSure ablation.   SURGEON:  Huel Cote, M.D.   ASSISTANT:  Zenaida Niece, M.D.   ANESTHESIA:  LMA.   SPECIMENS:  Endometrial curettings.   ESTIMATED BLOOD LOSS:  Minimal.   COMPLICATIONS:  None.   FINDINGS:  The uterine cavity had a small polyp noted on the left fundal  posterior wall.  The cavity width was approximately 4.5 in the length was 5  cm.  The treatment time for NovaSure was 52 seconds.   PROCEDURE:  The patient was taken to the operating room, where LMA  anesthesia was obtained without difficulty.  She was then prepped and draped in normal sterile fashion in dorsal  lithotomy position.  A speculum was placed within the vagina and the cervix  identified and grasped with a single-tooth tenaculum.  The uterus sounded to  approximately 8 cm.  A Hegar dilator was used to measure the cervix and the  cervical length felt to be 3 cm.  The cervix was slightly stenotic  externally from her previous LEEP procedures; however, the internal os  itself was slightly dilated.  The uterus was sequentially dilated without  difficulty and the diagnostic hysteroscope then introduced into the uterine  fundus.  The cavity was carefully inspected and only one small polyp noted  on the posterior left fundus.  The hysteroscope was then removed and the  polyp forceps utilized to try to grasp any polyp tissue as well as a  curettage  performed to remove any large polyp fragments.  No significant  amount of tissue was removed, and these were sent to pathology.  At this  point the NovaSure unit was introduced into the uterine fundus after its  settings had been set appropriately at a cavity length of 5.  This was  introduced into the fundus and the units and the unit engaged and  manipulated with a cavity width of 4.5 noted.  The cervical fill was then  put in place and the unit tested with a good seal noted.  At this point the  unit was activated for 52 seconds of treatment time and then the procedure  completed.  The NovaSure was then carefully removed and the hysteroscope  gently replaced into the uterine fundus.  Fundus was again inspected and the  previously-noted polyp was obliterated and the fundus itself appeared to be  cauterized nicely in  all quadrants.  At this point the hysteroscope was  once again removed from the cavity.  There was no hysteroscopic fluid  deficit noted,  and the tenaculum was removed from the cervix.  There was  a small amount of bleeding noted at the left tenaculum site, which was  controlled with a ring forceps, and at this point the speculum and all  instruments were removed from vagina.  Sponge, lap and needle counts were  correct x2 and the patient was taken to the recovery room awakened and in  stable condition.      KR/MEDQ  D:  03/31/2004  T:  03/31/2004  Job:  [id number]

## 2010-06-02 NOTE — Procedures (Signed)
NAMELULABELLE, DESTA                ACCOUNT NO.:  [id number]   MEDICAL RECORD NO.:  [id number]          PATIENT TYPE:  OUT   LOCATION:  SLEEP CENTER                 FACILITY:  [facility]   PHYSICIAN:  Marcelyn Bruins, M.D. [facility] DATE OF BIRTH:  Aug 22, 1960   DATE OF STUDY:  08/15/2004                              NOCTURNAL POLYSOMNOGRAM   REFERRING PHYSICIAN:  Sean A. Everardo All, M.D. [facility].   DATE OF STUDY:  August 15, 2004.   INDICATION FOR STUDY:  Hypersomnia with sleep apnea.   SLEEP ARCHITECTURE:  The patient had a total sleep time of 401 minutes with  adequate slow wave sleep but decreased REM. Sleep onset latency was normal  and REM latency was at the upper limits of normal. Sleep efficiency was 89%.   IMPRESSION:  1.  Mild obstructive sleep apnea/hypopnea syndrome with a respiratory      disturbance index of 18 events per hour and O2 desaturation as low as      86%. Events were not positional but they were clearly worse during REM.      Treatment at this degree of sleep apnea may consist of weight loss, oral      appliances, upper airway surgery, and also C-PAP. Clinical correlation      is suggested.  2.  Moderate to loud snoring noted throughout.  3.  No clinically significant cardiac arrhythmias.  4.  Moderate numbers of leg jerks with mild sleep disruption.           ______________________________  Marcelyn Bruins, M.D. [facility]  Diplomate, American Board of Sleep  Medicine     KC/MEDQ  D:  08/30/2004 09:40:20  T:  08/30/2004 10:06:54  Job:  [id number]

## 2010-06-05 ENCOUNTER — Telehealth: Payer: Self-pay | Admitting: Internal Medicine

## 2010-06-05 ENCOUNTER — Telehealth: Payer: Self-pay | Admitting: *Deleted

## 2010-06-05 NOTE — Telephone Encounter (Signed)
I called [employer] Prior Authorization and asked for a status on approval for the Vancomyocin for the C-Diff diagnosis. I was told by phone rep Archie Patten that they are still reviewing this.  I asked to speak to someone in the clinical team there or her manager.  She said there wasn't anyone I could speak with on the clinical team.  I again asked for her manager.  She put me on hold.  She came back and said they did a one time approval even though they are not finished with the prior authorization yet.  I advised the pt has been diagnosed with Clostridium Difficile Toxin and needs the medication now.  I also adivised they approved it 2 other times for this patient.  I called the [facility] and they did see the approval online and the pt's copay is $10.00.  The pt thanked me and is going to pick up the prescription.

## 2010-06-05 NOTE — Telephone Encounter (Signed)
Pt calling and wanting to know where her vancomycin rx stands. Lowry Ram CMA called [employer] on Thursday and was told it would be 72 hours for the prior-auth. Pam will call back today to check on it, pt notified.

## 2010-06-07 ENCOUNTER — Telehealth: Payer: Self-pay | Admitting: Physician Assistant

## 2010-06-07 NOTE — Telephone Encounter (Signed)
Patient is calling to report that for the last couple of days, she has been "light headed and had some nausea." States her head feels weird today. Patient says this all started before she started the Vancomycin. Also, wants Amy Esterwood, PA to know the diarrhea is better.

## 2010-06-07 NOTE — Telephone Encounter (Signed)
Spoke with Mike Gip, PA, these symptoms could be from the Vancomycin. Have patient push po fluids.

## 2010-06-14 ENCOUNTER — Telehealth: Payer: Self-pay | Admitting: Physician Assistant

## 2010-06-14 NOTE — Telephone Encounter (Signed)
Left a message for patient to call me back.

## 2010-06-15 NOTE — Telephone Encounter (Signed)
Patient calling to report that yesterday, she had 4 diarrhea stools, nausea and her stomach hurt when she ate. Today she has had diarrhea x 2 and her stomach is still hurting when she eats. States her stomach hurts in the middle in the area of her belly button. Describes the pain as spasm like. States "My stomach is in knots." Patient has been on Vancomycin po x 1 week for  Recurrent C. Diff. She is also taking Probiotic BID and Robinul daily. Patient last OV on 05/31/10 with Mike Gip, PA. Please, advise.

## 2010-06-15 NOTE — Telephone Encounter (Signed)
She is C. Diff +. Allergic to flagyl. What is her current vanco dose? If 125mg  qid, then increase to 250mg  qid. If already  on 250mg  then increase to 500mg  qid.

## 2010-06-16 MED ORDER — VANCOMYCIN HCL 250 MG PO CAPS: ORAL_CAPSULE | ORAL | Status: DC

## 2010-06-16 NOTE — Telephone Encounter (Signed)
Pt is taking vanco 250mg  qid. She started taking this 06/06/10. Do you want her to start over totally with the 500mg  qid or how do you want her to take the vanco? Please advise.

## 2010-06-16 NOTE — Telephone Encounter (Signed)
Pt aware of Dr. Lamar Sprinkles recommendations. Rx sent to [facility] pharmacy. Pt scheduled to see Dr. Marina Goodell 07/21/10@10am . Pt aware of appt date and time.

## 2010-06-16 NOTE — Telephone Encounter (Signed)
Since she was having symptoms on 250 mg 4 times a day, have her increase the vancomycin 500 mg 4 times a day for one full month. At that point, decreased 250 mg 4 times a day until further notice. Assuming that she is doing well, I would like to see her in about 6 weeks. At that time, again if she is doing well, she should be on vancomycin 250 mg 4 times a day

## 2010-06-16 NOTE — Telephone Encounter (Signed)
Left pt message to call back

## 2010-06-21 ENCOUNTER — Telehealth: Payer: Self-pay | Admitting: Internal Medicine

## 2010-06-21 NOTE — Telephone Encounter (Signed)
Pt asking about her prior auth for the vanco, let pt know we are supposed to hear something from the prior auth within 24hours. Pt aware.

## 2010-06-23 ENCOUNTER — Telehealth: Payer: Self-pay | Admitting: *Deleted

## 2010-06-23 NOTE — Telephone Encounter (Signed)
Called pharmacy to inform them PA for Vancomycin

## 2010-06-26 ENCOUNTER — Telehealth: Payer: Self-pay | Admitting: Internal Medicine

## 2010-06-26 ENCOUNTER — Encounter: Payer: Self-pay | Admitting: Gastroenterology

## 2010-06-26 NOTE — Telephone Encounter (Signed)
Pt states the pharmacy label on her med was incorrect. Spoke with [facility] and they did have an error on her label. Spoke with Joan Robinson and he will correct the error on the label and speak with the pt. Pt aware.

## 2010-06-26 NOTE — Telephone Encounter (Signed)
Error

## 2010-07-21 ENCOUNTER — Other Ambulatory Visit: Payer: Managed Care, Other (non HMO)

## 2010-07-21 ENCOUNTER — Encounter: Payer: Self-pay | Admitting: Internal Medicine

## 2010-07-21 ENCOUNTER — Ambulatory Visit (INDEPENDENT_AMBULATORY_CARE_PROVIDER_SITE_OTHER): Payer: Managed Care, Other (non HMO) | Admitting: Internal Medicine

## 2010-07-21 DIAGNOSIS — A0472 Enterocolitis due to Clostridium difficile, not specified as recurrent: Secondary | ICD-10-CM

## 2010-07-21 DIAGNOSIS — R198 Other specified symptoms and signs involving the digestive system and abdomen: Secondary | ICD-10-CM

## 2010-07-21 DIAGNOSIS — R197 Diarrhea, unspecified: Secondary | ICD-10-CM

## 2010-07-21 DIAGNOSIS — K589 Irritable bowel syndrome without diarrhea: Secondary | ICD-10-CM

## 2010-07-21 MED ORDER — CILIDINIUM-CHLORDIAZEPOXIDE 2.5-5 MG PO CAPS: 1.0000 | ORAL_CAPSULE | Freq: Three times a day (TID) | ORAL | Status: DC

## 2010-07-21 NOTE — Progress Notes (Signed)
HISTORY OF PRESENT ILLNESS:  Joan Robinson is a 50 y.o. female with the below listed medical history who is followed in this office for adenomatous colon polyps, intermittent irritable bowel type symptoms, remote ulcer disease, and recent problems with recurrent Clostridium difficile related diarrhea. She presents today regarding the latter. Last seen in the office 05/31/2010 by the physician assistant. Problems with diarrhea at that time. CBC and C-reactive protein unremarkable. Initiated on Florastor and Robinul forte. Stool for C. difficile by PCR return positive. Started on vancomycin. Subsequent worsening of symptoms with lower dose vancomycin. 5 weeks ago vancomycin increased to 500 mg 4 times a day. Decreased 250 mg 4 times a day one week ago. Presents now for followup. Currently describes varying degrees of loose stools. Possibly several per day. Some urgency and incontinence. No bleeding. She is emotional and frustrated. Mother died 2 months ago. Some postprandial abdominal discomfort. No fevers. Has gained weight. Being weaned off Zoloft. Being considered for Wellbutrin.  REVIEW OF SYSTEMS:  All non-GI ROS negative except for depression  Past Medical History  Diagnosis Date  . Allergic rhinitis   . Adenomatous colon polyp   . Diverticulosis   . Colitis, Clostridium difficile 5/11,6/11  . Hypothyroidism   . Urinary incontinence   . PUD (peptic ulcer disease)   . Tobacco abuse   . Hypertension   . Hyperlipidemia   . Colon polyp     Past Surgical History  Procedure Date  . Tubal ligation 1995  . Uterine suspension     mesh  . Colonoscopy   . Gated spect wall motion stress cardiolite 11/05/2001  . Polypectomy   . Abdominal hysterectomy     Social History Joan Robinson  reports that she quit smoking about 14 months ago. She has never used smokeless tobacco. She reports that she does not drink alcohol or use illicit drugs.  family history includes Colon cancer in her  paternal uncle; Colon polyps in her father; Heart disease in her mother; and Irritable bowel syndrome in her daughter.  Allergies  Allergen Reactions  . Aspirin     REACTION: difficulty breathing  . Dicyclomine Hcl     REACTION: mouth ulcers  . Metronidazole     REACTION: hives, mouth ulcers  . Moxifloxacin     REACTION: increased heart rate, nausea  . Nsaids     REACTION: difficulty breathing  . Sulfamethoxazole W/Trimethoprim     REACTION: increased heart rate, nausea  . Cardizem (Diltiazem Hcl) Rash       PHYSICAL EXAMINATION: Vital signs: BP 128/74  Pulse 80  Ht 5\' 8"  (1.727 m)  Wt 199 lb (90.266 kg)  BMI 30.26 kg/m2 General: Well-developed, well-nourished, no acute distress HEENT: Sclerae are anicteric, conjunctiva pink. Oral mucosa intact Lungs: Clear Heart: Regular Abdomen: soft, nontender, nondistended, no obvious ascites, no peritoneal signs, normal bowel sounds. No organomegaly. Extremities: No edema Psychiatric: alert and oriented x3. Cooperative. Obviously depressed    ASSESSMENT:  #1. History of Clostridium difficile related diarrhea. Currently with irregular bowel movements. Has been on high dose vancomycin for greater than 6 weeks. Not clear to me that her current problems are entirely related, if at all, to a Clostridium difficile. #2. Symptoms compatible with irritable bowel #3. History of adenomatous colon polyps. Last colonoscopy April 2011. Routine followup in 5 years recommended #4. Depression   PLAN: #1. Recheck C. difficile PCR #2. Prescribed Librax one before meals as needed #3. Low-dose Imodium, one per day. Hold for constipation #  4. Continue current dose of vancomycin until further notice. If PCR negative, we will begin a tapering dosage #5. Continue to work with psychiatrist for depression #6. Routine GI followup 4 weeks

## 2010-07-21 NOTE — Patient Instructions (Signed)
Librax sent to your pharmacy for you to pick up. Take Imodium over the counter qd Go to the basement and have labs done today. Make follow-up appointment with Dr. Marina Goodell in 4 weeks.

## 2010-07-23 LAB — CLOSTRIDIUM DIFFICILE BY PCR: Toxigenic C. Difficile by PCR: NOT DETECTED

## 2010-07-27 ENCOUNTER — Other Ambulatory Visit: Payer: Self-pay | Admitting: Family Medicine

## 2010-07-27 DIAGNOSIS — N6320 Unspecified lump in the left breast, unspecified quadrant: Secondary | ICD-10-CM

## 2010-08-01 ENCOUNTER — Ambulatory Visit
Admission: RE | Admit: 2010-08-01 | Discharge: 2010-08-01 | Disposition: A | Discharge status: Home / Self Care | Payer: Managed Care, Other (non HMO) | Source: Ambulatory Visit | Attending: Family Medicine | Admitting: Family Medicine

## 2010-08-01 DIAGNOSIS — N6320 Unspecified lump in the left breast, unspecified quadrant: Secondary | ICD-10-CM

## 2010-08-04 ENCOUNTER — Telehealth: Payer: Self-pay | Admitting: Internal Medicine

## 2010-08-04 NOTE — Telephone Encounter (Signed)
Pharmacy is calling wanting to know if the pt needs to continue her vancomycin. Last office note mentions if cdiff was negative may taper dose. Pt thinks she is supposed to stay on vancomycin until her next office visit 08/18/10. Pt has some left but if needs to continue will need prior auth done. Dr. Marina Goodell please advise.

## 2010-08-07 MED ORDER — VANCOMYCIN HCL 250 MG PO CAPS: ORAL_CAPSULE | ORAL | Status: DC

## 2010-08-07 NOTE — Telephone Encounter (Signed)
Pt aware of Dr. Lamar Sprinkles recommendation. Spoke with Amada Jupiter at the pharmacy, he has prior [place] and will order the meds. Meds should be in by tomorrow at 11am. Pt aware.

## 2010-08-07 NOTE — Telephone Encounter (Signed)
Needs to continue, to reduce risk of relapse. However, let's decrease to vanc 250mg  tid for one weeks then 250mg  bid until her office appt Aug 3rd.

## 2010-08-18 ENCOUNTER — Ambulatory Visit (INDEPENDENT_AMBULATORY_CARE_PROVIDER_SITE_OTHER): Payer: Managed Care, Other (non HMO) | Admitting: Internal Medicine

## 2010-08-18 ENCOUNTER — Encounter: Payer: Self-pay | Admitting: Internal Medicine

## 2010-08-18 VITALS — BP 132/76 | HR 88 | Ht 67.0 in | Wt 197.0 lb

## 2010-08-18 DIAGNOSIS — R143 Flatulence: Secondary | ICD-10-CM

## 2010-08-18 DIAGNOSIS — Z860100 Personal history of colon polyps, unspecified: Secondary | ICD-10-CM

## 2010-08-18 DIAGNOSIS — K589 Irritable bowel syndrome without diarrhea: Secondary | ICD-10-CM

## 2010-08-18 DIAGNOSIS — A0472 Enterocolitis due to Clostridium difficile, not specified as recurrent: Secondary | ICD-10-CM

## 2010-08-18 DIAGNOSIS — R141 Gas pain: Secondary | ICD-10-CM

## 2010-08-18 MED ORDER — ALIGN PO CAPS: 1.0000 | ORAL_CAPSULE | Freq: Every day | ORAL | Status: AC

## 2010-08-18 NOTE — Patient Instructions (Signed)
Samples of Align given Take Align one by mouth once daily Call back if not feeling better

## 2010-08-20 ENCOUNTER — Encounter: Payer: Self-pay | Admitting: Internal Medicine

## 2010-08-20 NOTE — Progress Notes (Signed)
HISTORY OF PRESENT ILLNESS:  Joan Robinson is a 50 y.o. female with the below listed medical history who is followed in this office for IBS, remote PUD, adenomatous polyps, and recurrent C. Diff related diarrhea. Last seen 07-21-2010 for diarrhea and abdominal discomfort. Weeks earlier w/ + C. Diff by PCR. Was on high dose vanco. Repeat C. Diff PCR on 07-23-10 was negative. Vanco taper initiated. Prescribed Librax ac but has not used to to drug reactions from antidepressant therapies. Currently on vanco 250 mg bid. States that she feels much better. Occasional postprandial loose stool with cramping. Main c/o now in increase gas and overactive bowel sounds.No persistent  pain or bleeding.Last colonoscopy 04-2009.  REVIEW OF SYSTEMS:  All non-GI ROS negative except for anxiety, depression, rash  Past Medical History  Diagnosis Date  . Allergic rhinitis   . Adenomatous colon polyp   . Diverticulosis   . Colitis, Clostridium difficile 5/11,6/11  . Hypothyroidism   . Urinary incontinence   . PUD (peptic ulcer disease)   . Tobacco abuse   . Hypertension   . Hyperlipidemia   . Colon polyp     Past Surgical History  Procedure Date  . Tubal ligation 1995  . Uterine suspension     mesh  . Colonoscopy   . Gated spect wall motion stress cardiolite 11/05/2001  . Polypectomy   . Abdominal hysterectomy     Social History Menucha Dicesare Mahany  reports that she quit smoking about 15 months ago. She has never used smokeless tobacco. She reports that she does not drink alcohol or use illicit drugs.  family history includes Colon cancer in her paternal uncle; Colon polyps in her father; Heart disease in her mother; and Irritable bowel syndrome in her daughter.  Allergies  Allergen Reactions  . Aspirin     REACTION: difficulty breathing  . Dicyclomine Hcl     REACTION: mouth ulcers  . Metronidazole     REACTION: hives, mouth ulcers  . Moxifloxacin     REACTION: increased heart rate, nausea  .  Nsaids     REACTION: difficulty breathing  . Sulfamethoxazole W/Trimethoprim     REACTION: increased heart rate, nausea  . Zoloft   . Cardizem (Diltiazem Hcl) Rash       PHYSICAL EXAMINATION: Vital signs: BP 132/76  Pulse 88  Ht 5\' 7"  (1.702 m)  Wt 197 lb (89.359 kg)  BMI 30.85 kg/m2 General: Well-developed, well-nourished, no acute distress HEENT: Sclerae are anicteric, conjunctiva pink. Oral mucosa intact Lungs: Clear Heart: Regular Abdomen: soft, nontender, nondistended, no obvious ascites, no peritoneal signs, normal bowel sounds. No organomegaly. Extremities: No edema Psychiatric: alert and oriented x3. Cooperative    ASSESSMENT:  1. C. Diff related diarrhea. Improved on vanco. Last PCR negative. 2. IBS 3. Hx adenomas 4. Gas   PLAN:  1. Continue vanco taper. 250 mg bid for one week, then 250mg  daily for 2 weeks then stop 2. Align one daily x several weeks for gas. Samples given 3. Librax prn  4. Colon follow up 04-2014

## 2010-08-24 ENCOUNTER — Telehealth: Payer: Self-pay | Admitting: Internal Medicine

## 2010-08-24 NOTE — Telephone Encounter (Signed)
Pt states she is down to 2 vancomycin daily. She is having loose stools, not diarrhea. She states that it seems to be related to what she eats. She states she ate pizza one day and a cinnamon roll one day and had terrible stomach cramping and had to stay in the bathroom. Pt wonders if perhaps she may have celiac disease. She states she has read some about this and it states you could have rashes and joint pain which she states she has had. Dr. Marina Goodell please advise.

## 2010-08-29 NOTE — Telephone Encounter (Signed)
Pt aware.

## 2010-08-29 NOTE — Telephone Encounter (Signed)
Best to take prilosec in am and Zantac at bedtime. Could increase prilosec to 40 mg if this change in scheduling doesn't help.

## 2010-08-29 NOTE — Telephone Encounter (Signed)
She has been evaluated for this previously and does NOT have celiac sprue. I do appreciate her concern and checking with [place].Thanks

## 2010-08-29 NOTE — Telephone Encounter (Signed)
Pt aware. Pt had one more question. States that she is taking Prilosec 20mg  in the evening and taking Zantac in the am. Pt reports that this is not working and she is having burning and problems with reflux. Pt wonders if she needs something different. Dr. Marina Goodell please advise.

## 2010-09-06 ENCOUNTER — Telehealth: Payer: Self-pay | Admitting: Internal Medicine

## 2010-09-06 NOTE — Telephone Encounter (Signed)
Pt states that for the past 3 days she has had diarrhea. She saw her PCP today and he thinks she may have a virus. She is running a fever. Her PCP instructed her to take Tylenol for the fever. She is also taking Immodium for the diarrhea. He also instructed her to let Dr. Marina Goodell know as a heads up in case she continues to have problems. Pt is down to her last week of Vancomycin.

## 2010-09-07 NOTE — Telephone Encounter (Signed)
Ok for now. Keep [place] posted

## 2010-10-06 LAB — DIFFERENTIAL
Basophils Relative: 1
Eosinophils Absolute: 0
Eosinophils Relative: 1
Lymphs Abs: 1.9
Neutrophils Relative %: 63

## 2010-10-06 LAB — HEPATIC FUNCTION PANEL
ALT: 17
Alkaline Phosphatase: 56
Bilirubin, Direct: 0.1
Indirect Bilirubin: 0.5
Total Protein: 7.7

## 2010-10-06 LAB — POCT CARDIAC MARKERS
Myoglobin, poc: 24.2
Myoglobin, poc: 30
Operator id: 234501

## 2010-10-06 LAB — CBC
HCT: 39.1
MCHC: 34.6
MCV: 92.4
Platelets: 198
WBC: 7

## 2010-10-06 LAB — I-STAT 8, (EC8 V) (CONVERTED LAB)
BUN: 4 — ABNORMAL LOW
Glucose, Bld: 112 — ABNORMAL HIGH
Hemoglobin: 14.3
Potassium: 4.1
Sodium: 140
pH, Ven: 7.367 — ABNORMAL HIGH

## 2010-10-06 LAB — LIPASE, BLOOD: Lipase: 19

## 2010-10-06 LAB — POCT I-STAT CREATININE: Creatinine, Ser: 0.9

## 2010-10-06 LAB — D-DIMER, QUANTITATIVE: D-Dimer, Quant: <0.22

## 2010-10-11 ENCOUNTER — Other Ambulatory Visit: Payer: Self-pay | Admitting: Family Medicine

## 2010-10-11 DIAGNOSIS — Z1231 Encounter for screening mammogram for malignant neoplasm of breast: Secondary | ICD-10-CM

## 2010-11-14 ENCOUNTER — Ambulatory Visit
Admission: RE | Admit: 2010-11-14 | Discharge: 2010-11-14 | Disposition: A | Discharge status: Home / Self Care | Payer: Managed Care, Other (non HMO) | Source: Ambulatory Visit | Attending: Family Medicine | Admitting: Family Medicine

## 2010-11-14 DIAGNOSIS — Z1231 Encounter for screening mammogram for malignant neoplasm of breast: Secondary | ICD-10-CM

## 2010-11-21 ENCOUNTER — Ambulatory Visit: Payer: Managed Care, Other (non HMO)

## 2010-11-24 ENCOUNTER — Telehealth: Payer: Self-pay | Admitting: Internal Medicine

## 2010-11-24 NOTE — Telephone Encounter (Signed)
All rxs stamped and faxed back

## 2010-11-24 NOTE — Telephone Encounter (Signed)
[facility] needs [place] to stamp the original rx of Vancomycin from August.  They will fax it over today

## 2010-12-04 ENCOUNTER — Other Ambulatory Visit: Payer: Self-pay

## 2010-12-04 MED ORDER — ONDANSETRON HCL 4 MG PO TABS: 4.0000 mg | ORAL_TABLET | Freq: Four times a day (QID) | ORAL | Status: DC | PRN

## 2010-12-04 NOTE — Telephone Encounter (Signed)
Message copied by Jeanine Luz on Mon Dec 04, 2010  4:10 PM ------      Message from: Hilarie Fredrickson      Created: Mon Dec 04, 2010  3:41 PM       Yes, please refill. Thank you      ----- Message -----         From: Gracy Racer         Sent: 12/04/2010   3:23 PM           To: Yancey Flemings, MD            Received refill request for Zofran, last ordered 05-13-10. Last o/v 08-18-10 - nothing said about Zofran in the Pt's plan.   Would you like this refilled for her?

## 2011-01-16 LAB — HM PAP SMEAR

## 2011-01-18 ENCOUNTER — Telehealth: Payer: Self-pay | Admitting: Internal Medicine

## 2011-01-18 MED ORDER — HYDROCORTISONE ACETATE 25 MG RE SUPP: 25.0000 mg | Freq: Every day | RECTAL | Status: DC

## 2011-01-18 NOTE — Telephone Encounter (Signed)
Sounds like a fissure and not related to surgery. Fiber, anusol HC supp at night, and office visit with an extender in the next week or two for evaluation - if no better.

## 2011-01-18 NOTE — Telephone Encounter (Signed)
Pt aware, rx sent to pharmacy. Pt instructed to call back to let us know if no improvement.

## 2011-01-18 NOTE — Telephone Encounter (Signed)
Pt states that she had a hysterectomy in 2011. She recently saw a urologist because she thought she had some blood in her urine. Per the urologist when she had the hysterectomy they "rebuilt her rectum with mesh." The op report mentions repair of rectocele with the hysterectomy.  Pt states the urologist tells her the bleeding must be coming from her rectum.  Pt states that she is having a lot of burning when she has a bowel movement and when she wipes there is blood. States that she has to take probiotics daily or she has diarrhea. Pt wants to know if all her problems could be related to the surgery that she had and what can she do for the burning and bleeding. Dr. Marina Goodell please advise.

## 2011-02-21 ENCOUNTER — Encounter: Payer: Self-pay | Admitting: Nurse Practitioner

## 2011-03-23 ENCOUNTER — Telehealth: Payer: Self-pay

## 2011-03-23 MED ORDER — VANCOMYCIN HCL 125 MG PO CAPS: 125.0000 mg | ORAL_CAPSULE | Freq: Four times a day (QID) | ORAL | Status: AC

## 2011-03-23 NOTE — Telephone Encounter (Signed)
Pt has sinus infection. States she took a prednisone dose pack and doxycyline 100mg  bid for 10days. The infection was not cleared up so she was placed on Augmentin 875mg  bid on Tuesday. She has started having some nausea and diarrhea today, she has had 5 diarrhea stools today. She states she is taking trubiotic along with the antibiotic. Dr. Marina Goodell please advise.

## 2011-03-23 NOTE — Telephone Encounter (Signed)
Called and spoke with pt, she states she is allergic to metronidazole. Please advise.

## 2011-03-23 NOTE — Telephone Encounter (Signed)
She has had C. Diff in the past. Hopefully she can finish these antibiotics soon. In meantime, start florasor bid and metronidazole 250mg  qid x 7 days. If still with diarrhea early next week, needs to come in for stool C. diff / PCR

## 2011-03-23 NOTE — Telephone Encounter (Signed)
THEN VANCO 125MG  QID X 1 WEEEK.Marland KitchenMarland Kitchen

## 2011-03-23 NOTE — Telephone Encounter (Signed)
Spoke with pt and she is aware. Rx sent to pharmacy. 

## 2011-03-30 ENCOUNTER — Telehealth: Payer: Self-pay | Admitting: Internal Medicine

## 2011-03-30 DIAGNOSIS — R197 Diarrhea, unspecified: Secondary | ICD-10-CM

## 2011-03-30 NOTE — Telephone Encounter (Signed)
Pt states she has one more day of vanc left and she is still having diarrhea. Per Dr. Lamar Sprinkles previous note she should be tested for cdiff if still having diarrhea. Pt will come next week for lab. Orders entered.

## 2011-04-06 ENCOUNTER — Other Ambulatory Visit: Payer: Managed Care, Other (non HMO)

## 2011-04-06 DIAGNOSIS — R197 Diarrhea, unspecified: Secondary | ICD-10-CM

## 2011-08-02 ENCOUNTER — Telehealth: Payer: Self-pay | Admitting: Internal Medicine

## 2011-08-02 NOTE — Telephone Encounter (Signed)
Patient reports several day history of green stool.  She also reports continued problems with IBS.  She will come in and see Dr. Marina Goodell on 08/07/11 11:00

## 2011-08-07 ENCOUNTER — Ambulatory Visit: Payer: Managed Care, Other (non HMO) | Admitting: Internal Medicine

## 2011-10-09 ENCOUNTER — Other Ambulatory Visit: Payer: Self-pay | Admitting: Obstetrics & Gynecology

## 2011-10-09 DIAGNOSIS — Z1231 Encounter for screening mammogram for malignant neoplasm of breast: Secondary | ICD-10-CM

## 2011-10-10 ENCOUNTER — Ambulatory Visit (INDEPENDENT_AMBULATORY_CARE_PROVIDER_SITE_OTHER): Payer: Managed Care, Other (non HMO) | Admitting: Family Medicine

## 2011-10-10 ENCOUNTER — Encounter: Payer: Self-pay | Admitting: Family Medicine

## 2011-10-10 VITALS — BP 126/82 | HR 80 | Temp 97.6°F | Ht 67.5 in | Wt 199.8 lb

## 2011-10-10 DIAGNOSIS — Z0000 Encounter for general adult medical examination without abnormal findings: Secondary | ICD-10-CM

## 2011-10-10 DIAGNOSIS — I10 Essential (primary) hypertension: Secondary | ICD-10-CM

## 2011-10-10 DIAGNOSIS — IMO0001 Reserved for inherently not codable concepts without codable children: Secondary | ICD-10-CM

## 2011-10-10 DIAGNOSIS — H35379 Puckering of macula, unspecified eye: Secondary | ICD-10-CM

## 2011-10-10 DIAGNOSIS — M545 Low back pain, unspecified: Secondary | ICD-10-CM | POA: Insufficient documentation

## 2011-10-10 DIAGNOSIS — R32 Unspecified urinary incontinence: Secondary | ICD-10-CM

## 2011-10-10 DIAGNOSIS — D649 Anemia, unspecified: Secondary | ICD-10-CM

## 2011-10-10 DIAGNOSIS — K219 Gastro-esophageal reflux disease without esophagitis: Secondary | ICD-10-CM

## 2011-10-10 DIAGNOSIS — J309 Allergic rhinitis, unspecified: Secondary | ICD-10-CM

## 2011-10-10 DIAGNOSIS — J209 Acute bronchitis, unspecified: Secondary | ICD-10-CM | POA: Insufficient documentation

## 2011-10-10 DIAGNOSIS — H35373 Puckering of macula, bilateral: Secondary | ICD-10-CM

## 2011-10-10 DIAGNOSIS — L508 Other urticaria: Secondary | ICD-10-CM

## 2011-10-10 DIAGNOSIS — J019 Acute sinusitis, unspecified: Secondary | ICD-10-CM

## 2011-10-10 HISTORY — DX: Anemia, unspecified: D64.9

## 2011-10-10 HISTORY — DX: Puckering of macula, bilateral: H35.373

## 2011-10-10 HISTORY — DX: Encounter for general adult medical examination without abnormal findings: Z00.00

## 2011-10-10 HISTORY — DX: Acute sinusitis, unspecified: J01.90

## 2011-10-10 MED ORDER — GUAIFENESIN ER 600 MG PO TB12: 600.0000 mg | ORAL_TABLET | Freq: Two times a day (BID) | ORAL | Status: DC

## 2011-10-10 MED ORDER — AZITHROMYCIN 250 MG PO TABS: ORAL_TABLET | ORAL | Status: DC

## 2011-10-10 MED ORDER — METHYLPREDNISOLONE 4 MG PO KIT: PACK | ORAL | Status: DC

## 2011-10-10 MED ORDER — RANITIDINE HCL 150 MG PO TABS: 150.0000 mg | ORAL_TABLET | Freq: Two times a day (BID) | ORAL | Status: DC

## 2011-10-10 NOTE — Assessment & Plan Note (Signed)
Adequately controlled despite acute illness today. No changes

## 2011-10-10 NOTE — Assessment & Plan Note (Signed)
Has had full work up with [facility]

## 2011-10-10 NOTE — Assessment & Plan Note (Addendum)
Consider Tdap at next visit, had a Td in 2006, request old records. Check fasting labs prior to next visit. Has MGM scheduled for next month.

## 2011-10-10 NOTE — Assessment & Plan Note (Signed)
Will repeat CBC along with other fasting labs prior to next visit

## 2011-10-10 NOTE — Patient Instructions (Addendum)
Preventive Care for Adults, Female A healthy lifestyle and preventive care can promote health and wellness. Preventive health guidelines for women include the following key practices.  A routine yearly physical is a good way to check with your caregiver about your health and preventive screening. It is a chance to share any concerns and updates on your health, and to receive a thorough exam.   Visit your dentist for a routine exam and preventive care every 6 months. Brush your teeth twice a day and floss once a day. Good oral hygiene prevents tooth decay and gum disease.   The frequency of eye exams is based on your age, health, family medical history, use of contact lenses, and other factors. Follow your caregiver's recommendations for frequency of eye exams.   Eat a healthy diet. Foods like vegetables, fruits, whole grains, low-fat dairy products, and lean protein foods contain the nutrients you need without too many calories. Decrease your intake of foods high in solid fats, added sugars, and salt. Eat the right amount of calories for you.Get information about a proper diet from your caregiver, if necessary.   Regular physical exercise is one of the most important things you can do for your health. Most adults should get at least 150 minutes of moderate-intensity exercise (any activity that increases your heart rate and causes you to sweat) each week. In addition, most adults need muscle-strengthening exercises on 2 or more days a week.   Maintain a healthy weight. The body mass index (BMI) is a screening tool to identify possible weight problems. It provides an estimate of body fat based on height and weight. Your caregiver can help determine your BMI, and can help you achieve or maintain a healthy weight.For adults 20 years and older:   A BMI below 18.5 is considered underweight.   A BMI of 18.5 to 24.9 is normal.   A BMI of 25 to 29.9 is considered overweight.   A BMI of 30 and above is  considered obese.   Maintain normal blood lipids and cholesterol levels by exercising and minimizing your intake of saturated fat. Eat a balanced diet with plenty of fruit and vegetables. Blood tests for lipids and cholesterol should begin at age 20 and be repeated every 5 years. If your lipid or cholesterol levels are high, you are over 50, or you are at high risk for heart disease, you may need your cholesterol levels checked more frequently.Ongoing high lipid and cholesterol levels should be treated with medicines if diet and exercise are not effective.   If you smoke, find out from your caregiver how to quit. If you do not use tobacco, do not start.   If you are pregnant, do not drink alcohol. If you are breastfeeding, be very cautious about drinking alcohol. If you are not pregnant and choose to drink alcohol, do not exceed 1 drink per day. One drink is considered to be 12 ounces (355 mL) of beer, 5 ounces (148 mL) of wine, or 1.5 ounces (44 mL) of liquor.   Avoid use of street drugs. Do not share needles with anyone. Ask for help if you need support or instructions about stopping the use of drugs.   High blood pressure causes heart disease and increases the risk of stroke. Your blood pressure should be checked at least every 1 to 2 years. Ongoing high blood pressure should be treated with medicines if weight loss and exercise are not effective.   If you are 55 to 51   years old, ask your caregiver if you should take aspirin to prevent strokes.   Diabetes screening involves taking a blood sample to check your fasting blood sugar level. This should be done once every 3 years, after age 45, if you are within normal weight and without risk factors for diabetes. Testing should be considered at a younger age or be carried out more frequently if you are overweight and have at least 1 risk factor for diabetes.   Breast cancer screening is essential preventive care for women. You should practice "breast  self-awareness." This means understanding the normal appearance and feel of your breasts and may include breast self-examination. Any changes detected, no matter how small, should be reported to a caregiver. Women in their 20s and 30s should have a clinical breast exam (CBE) by a caregiver as part of a regular health exam every 1 to 3 years. After age 40, women should have a CBE every year. Starting at age 40, women should consider having a mammography (breast X-ray test) every year. Women who have a family history of breast cancer should talk to their caregiver about genetic screening. Women at a high risk of breast cancer should talk to their caregivers about having magnetic resonance imaging (MRI) and a mammography every year.   The Pap test is a screening test for cervical cancer. A Pap test can show cell changes on the cervix that might become cervical cancer if left untreated. A Pap test is a procedure in which cells are obtained and examined from the lower end of the uterus (cervix).   Women should have a Pap test starting at age 21.   Between ages 21 and 29, Pap tests should be repeated every 2 years.   Beginning at age 30, you should have a Pap test every 3 years as long as the past 3 Pap tests have been normal.   Some women have medical problems that increase the chance of getting cervical cancer. Talk to your caregiver about these problems. It is especially important to talk to your caregiver if a new problem develops soon after your last Pap test. In these cases, your caregiver may recommend more frequent screening and Pap tests.   The above recommendations are the same for women who have or have not gotten the vaccine for human papillomavirus (HPV).   If you had a hysterectomy for a problem that was not cancer or a condition that could lead to cancer, then you no longer need Pap tests. Even if you no longer need a Pap test, a regular exam is a good idea to make sure no other problems are  starting.   If you are between ages 65 and 70, and you have had normal Pap tests going back 10 years, you no longer need Pap tests. Even if you no longer need a Pap test, a regular exam is a good idea to make sure no other problems are starting.   If you have had past treatment for cervical cancer or a condition that could lead to cancer, you need Pap tests and screening for cancer for at least 20 years after your treatment.   If Pap tests have been discontinued, risk factors (such as a new sexual partner) need to be reassessed to determine if screening should be resumed.   The HPV test is an additional test that may be used for cervical cancer screening. The HPV test looks for the virus that can cause the cell changes on the cervix.   The cells collected during the Pap test can be tested for HPV. The HPV test could be used to screen women aged 30 years and older, and should be used in women of any age who have unclear Pap test results. After the age of 30, women should have HPV testing at the same frequency as a Pap test.   Colorectal cancer can be detected and often prevented. Most routine colorectal cancer screening begins at the age of 50 and continues through age 75. However, your caregiver may recommend screening at an earlier age if you have risk factors for colon cancer. On a yearly basis, your caregiver may provide home test kits to check for hidden blood in the stool. Use of a small camera at the end of a tube, to directly examine the colon (sigmoidoscopy or colonoscopy), can detect the earliest forms of colorectal cancer. Talk to your caregiver about this at age 50, when routine screening begins. Direct examination of the colon should be repeated every 5 to 10 years through age 75, unless early forms of pre-cancerous polyps or small growths are found.   Hepatitis C blood testing is recommended for all people born from 1945 through 1965 and any individual with known risks for hepatitis C.    Practice safe sex. Use condoms and avoid high-risk sexual practices to reduce the spread of sexually transmitted infections (STIs). STIs include gonorrhea, chlamydia, syphilis, trichomonas, herpes, HPV, and human immunodeficiency virus (HIV). Herpes, HIV, and HPV are viral illnesses that have no cure. They can result in disability, cancer, and death. Sexually active women aged 25 and younger should be checked for chlamydia. Older women with new or multiple partners should also be tested for chlamydia. Testing for other STIs is recommended if you are sexually active and at increased risk.   Osteoporosis is a disease in which the bones lose minerals and strength with aging. This can result in serious bone fractures. The risk of osteoporosis can be identified using a bone density scan. Women ages 65 and over and women at risk for fractures or osteoporosis should discuss screening with their caregivers. Ask your caregiver whether you should take a calcium supplement or vitamin D to reduce the rate of osteoporosis.   Menopause can be associated with physical symptoms and risks. Hormone replacement therapy is available to decrease symptoms and risks. You should talk to your caregiver about whether hormone replacement therapy is right for you.   Use sunscreen with sun protection factor (SPF) of 30 or more. Apply sunscreen liberally and repeatedly throughout the day. You should seek shade when your shadow is shorter than you. Protect yourself by wearing long sleeves, pants, a wide-brimmed hat, and sunglasses year round, whenever you are outdoors.   Once a month, do a whole body skin exam, using a mirror to look at the skin on your back. Notify your caregiver of new moles, moles that have irregular borders, moles that are larger than a pencil eraser, or moles that have changed in shape or color.   Stay current with required immunizations.   Influenza. You need a dose every fall (or winter). The composition of  the flu vaccine changes each year, so being vaccinated once is not enough.   Pneumococcal polysaccharide. You need 1 to 2 doses if you smoke cigarettes or if you have certain chronic medical conditions. You need 1 dose at age 65 (or older) if you have never been vaccinated.   Tetanus, diphtheria, pertussis (Tdap, Td). Get 1 dose of   Tdap vaccine if you are younger than age 65, are over 65 and have contact with an infant, are a healthcare worker, are pregnant, or simply want to be protected from whooping cough. After that, you need a Td booster dose every 10 years. Consult your caregiver if you have not had at least 3 tetanus and diphtheria-containing shots sometime in your life or have a deep or dirty wound.   HPV. You need this vaccine if you are a woman age 26 or younger. The vaccine is given in 3 doses over 6 months.   Measles, mumps, rubella (MMR). You need at least 1 dose of MMR if you were born in 1957 or later. You may also need a second dose.   Meningococcal. If you are age 19 to 21 and a first-year college student living in a residence hall, or have one of several medical conditions, you need to get vaccinated against meningococcal disease. You may also need additional booster doses.   Zoster (shingles). If you are age 60 or older, you should get this vaccine.   Varicella (chickenpox). If you have never had chickenpox or you were vaccinated but received only 1 dose, talk to your caregiver to find out if you need this vaccine.   Hepatitis A. You need this vaccine if you have a specific risk factor for hepatitis A virus infection or you simply wish to be protected from this disease. The vaccine is usually given as 2 doses, 6 to 18 months apart.   Hepatitis B. You need this vaccine if you have a specific risk factor for hepatitis B virus infection or you simply wish to be protected from this disease. The vaccine is given in 3 doses, usually over 6 months.  Preventive Services /  Frequency Ages 19 to 39  Blood pressure check.** / Every 1 to 2 years.   Lipid and cholesterol check.** / Every 5 years beginning at age 20.   Clinical breast exam.** / Every 3 years for women in their 20s and 30s.   Pap test.** / Every 2 years from ages 21 through 29. Every 3 years starting at age 30 through age 65 or 70 with a history of 3 consecutive normal Pap tests.   HPV screening.** / Every 3 years from ages 30 through ages 65 to 70 with a history of 3 consecutive normal Pap tests.   Hepatitis C blood test.** / For any individual with known risks for hepatitis C.   Skin self-exam. / Monthly.   Influenza immunization.** / Every year.   Pneumococcal polysaccharide immunization.** / 1 to 2 doses if you smoke cigarettes or if you have certain chronic medical conditions.   Tetanus, diphtheria, pertussis (Tdap, Td) immunization. / A one-time dose of Tdap vaccine. After that, you need a Td booster dose every 10 years.   HPV immunization. / 3 doses over 6 months, if you are 26 and younger.   Measles, mumps, rubella (MMR) immunization. / You need at least 1 dose of MMR if you were born in 1957 or later. You may also need a second dose.   Meningococcal immunization. / 1 dose if you are age 19 to 21 and a first-year college student living in a residence hall, or have one of several medical conditions, you need to get vaccinated against meningococcal disease. You may also need additional booster doses.   Varicella immunization.** / Consult your caregiver.   Hepatitis A immunization.** / Consult your caregiver. 2 doses, 6 to 18 months   apart.   Hepatitis B immunization.** / Consult your caregiver. 3 doses usually over 6 months.  Ages 40 to 64  Blood pressure check.** / Every 1 to 2 years.   Lipid and cholesterol check.** / Every 5 years beginning at age 20.   Clinical breast exam.** / Every year after age 40.   Mammogram.** / Every year beginning at age 40 and continuing for as  long as you are in good health. Consult with your caregiver.   Pap test.** / Every 3 years starting at age 30 through age 65 or 70 with a history of 3 consecutive normal Pap tests.   HPV screening.** / Every 3 years from ages 30 through ages 65 to 70 with a history of 3 consecutive normal Pap tests.   Fecal occult blood test (FOBT) of stool. / Every year beginning at age 50 and continuing until age 75. You may not need to do this test if you get a colonoscopy every 10 years.   Flexible sigmoidoscopy or colonoscopy.** / Every 5 years for a flexible sigmoidoscopy or every 10 years for a colonoscopy beginning at age 50 and continuing until age 75.   Hepatitis C blood test.** / For all people born from 1945 through 1965 and any individual with known risks for hepatitis C.   Skin self-exam. / Monthly.   Influenza immunization.** / Every year.   Pneumococcal polysaccharide immunization.** / 1 to 2 doses if you smoke cigarettes or if you have certain chronic medical conditions.   Tetanus, diphtheria, pertussis (Tdap, Td) immunization.** / A one-time dose of Tdap vaccine. After that, you need a Td booster dose every 10 years.   Measles, mumps, rubella (MMR) immunization. / You need at least 1 dose of MMR if you were born in 1957 or later. You may also need a second dose.   Varicella immunization.** / Consult your caregiver.   Meningococcal immunization.** / Consult your caregiver.   Hepatitis A immunization.** / Consult your caregiver. 2 doses, 6 to 18 months apart.   Hepatitis B immunization.** / Consult your caregiver. 3 doses, usually over 6 months.  Ages 65 and over  Blood pressure check.** / Every 1 to 2 years.   Lipid and cholesterol check.** / Every 5 years beginning at age 20.   Clinical breast exam.** / Every year after age 40.   Mammogram.** / Every year beginning at age 40 and continuing for as long as you are in good health. Consult with your caregiver.   Pap test.** /  Every 3 years starting at age 30 through age 65 or 70 with a 3 consecutive normal Pap tests. Testing can be stopped between 65 and 70 with 3 consecutive normal Pap tests and no abnormal Pap or HPV tests in the past 10 years.   HPV screening.** / Every 3 years from ages 30 through ages 65 or 70 with a history of 3 consecutive normal Pap tests. Testing can be stopped between 65 and 70 with 3 consecutive normal Pap tests and no abnormal Pap or HPV tests in the past 10 years.   Fecal occult blood test (FOBT) of stool. / Every year beginning at age 50 and continuing until age 75. You may not need to do this test if you get a colonoscopy every 10 years.   Flexible sigmoidoscopy or colonoscopy.** / Every 5 years for a flexible sigmoidoscopy or every 10 years for a colonoscopy beginning at age 50 and continuing until age 75.   Hepatitis   C blood test.** / For all people born from [birth year] through 1965 and any individual with known risks for hepatitis C.   Osteoporosis screening.** / A one-time screening for women ages 49 and over and women at risk for fractures or osteoporosis.   Skin self-exam. / Monthly.   Influenza immunization.** / Every year.   Pneumococcal polysaccharide immunization.** / 1 dose at age 77 (or older) if you have never been vaccinated.   Tetanus, diphtheria, pertussis (Tdap, Td) immunization. / A one-time dose of Tdap vaccine if you are over 65 and have contact with an infant, are a Research scientist (physical sciences), or simply want to be protected from whooping cough. After that, you need a Td booster dose every 10 years.   Varicella immunization.** / Consult your caregiver.   Meningococcal immunization.** / Consult your caregiver.   Hepatitis A immunization.** / Consult your caregiver. 2 doses, 6 to 18 months apart.   Hepatitis B immunization.** / Check with your caregiver. 3 doses, usually over 6 months.  ** Family history and personal history of risk and conditions may change your caregiver's  recommendations. Document Released: 02/27/2001 Document Revised: 12/21/2010 Document Reviewed: 05/29/2010 [facility] Patient Information 2012 [place], [place].  Consider starting MegaRed krill oil caps 1 daily by Schiff (generic equivalent is OK).

## 2011-10-10 NOTE — Assessment & Plan Note (Addendum)
Encouraged increased rest, fluids, probiotics and Mucinex. Started on antibiotics and patient is to notify us if symptoms do not improve. Given rx for Zpak and medrol dosepak

## 2011-10-10 NOTE — Assessment & Plan Note (Signed)
Continue Allegra.

## 2011-10-10 NOTE — Assessment & Plan Note (Signed)
Gets Hydrocondone from [place] takes the meds sparinglg and is attempting to avoid surgery if possible

## 2011-10-10 NOTE — Assessment & Plan Note (Signed)
Is going to [facility] for second opinion before proceeding with surgery

## 2011-10-10 NOTE — Assessment & Plan Note (Signed)
Well-controlled  at this time 

## 2011-10-10 NOTE — Progress Notes (Signed)
Patient ID: Joan Robinson, female   DOB: July 16, 1960, 51 y.o.   MRN: [id number] Joan Robinson [id number] 09/11/1960 10/10/2011      Progress Note New Patient  Subjective  Chief Complaint  Chief Complaint  Patient presents with  . Establish Care    new pt  . Sinusitis    ears hurt and sore throat X 2 weeks: cough-no phlegm, yellow mucus in nose, achy, freezing, sneezing X 2 days    HPI  Patient is a 51 year old Caucasian female in today for urgent new patient appointment. She's been struggling with respiratory symptoms for about 2 weeks now over the last 24 hours is worsened. Satellite ear pain and pressure. She's had headache, malaise, myalgias and over the last 24 hours has developed a cough which is essentially nonproductive as well as increased nasal congestion with with thick yellow sputum being produced. She's been using some Tylenol as needed for discomfort as well as some NyQuil at night which has been helping her sleep. She notes some low-grade throat irritation and sneezing. No change in her diarrhea although she does have chronic history of such. No nausea vomiting. Does complain of some chronic low back pain as well some recent neck pain with her acute illness. Struggles with urinary incontinence and has had a lot of GYN troubles as well. Did have a total hysterectomy with bilateral salpingo-oophorectomy and a rectal sling done several years ago but continues to have problems with the perineum. Has occasional episodes of bleeding she is not sure if it is vaginal or rectal but believes it is erythema. Scant was only having 4 times in 2 years. Has not happened recently. Her last Pap about 6 months ago. She is scheduled for an eye appointment at [facility] in October secondary to a diagnosis of macular pucker. She's getting a second opinion before she decides whether or not to proceed with surgical correction. She sees no for neurosurgery for her chronic back pain and uses hydrocodone  infrequently. Is trying to avoid surgery but with reduced disease continues to follow with neurosurgery Past Medical History  Diagnosis Date  . Allergic rhinitis   . Adenomatous colon polyp   . Diverticulosis   . Colitis, Clostridium difficile 5/11,6/11  . Hypothyroidism   . Urinary incontinence   . PUD (peptic ulcer disease)   . Tobacco abuse   . Hypertension   . Hyperlipidemia   . Colon polyp   . Anemia 10/10/2011  . Preventative health care 10/10/2011  . Sinusitis acute 10/10/2011  . Chicken pox as a child    X 2  . Mumps as a child    Past Surgical History  Procedure Date  . Tubal ligation 1995  . Uterine suspension     mesh  . Colonoscopy   . Gated spect wall motion stress cardiolite 11/05/2001  . Polypectomy   . Abdominal hysterectomy   . Abdominal hysterectomy 2011    complete  . Dilation and curettage of uterus 1985    Family History  Problem Relation Age of Onset  . Heart disease Mother     stents  . Stroke Mother   . Hyperlipidemia Mother   . Hypertension Mother   . Other Mother     blood disorder- mgus  . Colon polyps Father   . Other Father     aorta disection  . Aneurysm Father   . Colon cancer Paternal Uncle   . Irritable bowel syndrome Daughter   . Other Daughter  gastritis  . Mental illness Daughter     bipolar and mood disorder  . Hypertension Sister     ?  Marland Kitchen Mental illness Son     bipolar  . Arthritis Sister   . Other Sister     thyroid  . Other Sister     thyroid    History   Social History  . Marital Status: Married    Spouse Name: N/A    Number of Children: 2  . Years of Education: N/A   Occupational History  . 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 Smoking status: Former Smoker -- 1.0 packs/day for 30 years    Types: Cigarettes    Quit date: 05/15/2009  . Smokeless tobacco: Never Used  . Alcohol Use: No  . Drug Use: No  . Sexually Active: No   Other Topics Concern  . Not on file   Social  History Narrative  . No narrative on file    Current Outpatient Prescriptions on File Prior to Visit  Medication Sig Dispense Refill  . ALPRAZolam (XANAX) 0.25 MG tablet Take 0.25 mg by mouth at bedtime as needed.        . calcium carbonate (TUMS E-X 750) 750 MG chewable tablet Chew 2 tablets by mouth daily.        Marland Kitchen estradiol (MINIVELLE) 0.05 MG/24HR Place 1 patch onto the skin as directed. 1 patch on Mon and Thurs      . fexofenadine (ALLEGRA) 180 MG tablet Take 180 mg by mouth daily.      Marland Kitchen HYDROcodone-acetaminophen (NORCO) 10-325 MG per tablet Take 1 tablet by mouth every 6 (six) hours as needed.        . metoprolol tartrate (LOPRESSOR) 25 MG tablet Take 25 mg by mouth. 1/2 tablet by mouth once daily      . omeprazole (PRILOSEC) 20 MG capsule Take 20 mg by mouth daily.      . ondansetron (ZOFRAN) 4 MG tablet Take 1 tablet (4 mg total) by mouth every 6 (six) hours as needed.  20 tablet  5  . Probiotic Product (PROBIOTIC FORMULA) CAPS Take 1 capsule by mouth daily.        . ranitidine (ZANTAC) 150 MG tablet Take 150 mg by mouth daily.      . cholecalciferol (VITAMIN D) 1000 UNITS tablet Take 1,000 Units by mouth daily.        . clidinium-chlordiazePOXIDE (LIBRAX) 2.5-5 MG per capsule Take 1 capsule by mouth 3 (three) times daily before meals.  100 capsule  1  . Multiple Vitamin (MULTIVITAMIN) tablet Take 1 tablet by mouth daily.        . nitroGLYCERIN (NITROSTAT) 0.4 MG SL tablet Place 0.4 mg under the tongue every 5 (five) minutes as needed.          Allergies  Allergen Reactions  . Aspirin     REACTION: difficulty breathing  . Dicyclomine Hcl     REACTION: mouth ulcers  . Metronidazole     REACTION: hives, mouth ulcers  . Moxifloxacin     REACTION: increased heart rate, nausea  . Nsaids     REACTION: difficulty breathing  . Phenergan (Promethazine Hcl)     "knocks" pt out for about 3 days  . Sertraline Hcl   . Sulfamethoxazole W-Trimethoprim     REACTION: increased heart  rate, nausea    Review of Systems  Review of Systems  Constitutional: Positive for fever, chills and malaise/fatigue.  HENT: Positive for ear pain, congestion, sore throat and neck pain. Negative for hearing loss and nosebleeds.   Eyes: Negative for discharge.  Respiratory: Negative for cough, sputum production, shortness of breath and wheezing.   Cardiovascular: Negative for chest pain, palpitations and leg swelling.  Gastrointestinal: Positive for diarrhea. Negative for heartburn, nausea, vomiting, abdominal pain, constipation and blood in stool.  Genitourinary: Negative for dysuria, urgency, frequency and hematuria.  Musculoskeletal: Positive for back pain. Negative for myalgias and falls.  Skin: Negative for rash.  Neurological: Positive for headaches. Negative for dizziness, tremors, sensory change, focal weakness, loss of consciousness and weakness.  Endo/Heme/Allergies: Negative for polydipsia. Does not bruise/bleed easily.  Psychiatric/Behavioral: Negative for depression and suicidal ideas. The patient is not nervous/anxious and does not have insomnia.     Objective  BP 126/82  Pulse 80  Temp 97.6 F (36.4 C) (Temporal)  Ht 5' 7.5" (1.715 m)  Wt 199 lb 12.8 oz (90.629 kg)  BMI 30.83 kg/m2  SpO2 99%  Physical Exam  Physical Exam  Constitutional: She is oriented to person, place, and time and well-developed, well-nourished, and in no distress. No distress.  HENT:  Head: Normocephalic and atraumatic.  Right Ear: External ear normal.  Left Ear: External ear normal.  Nose: Nose normal.  Mouth/Throat: Oropharynx is clear and moist. No oropharyngeal exudate.  Eyes: Conjunctivae normal are normal. Pupils are equal, round, and reactive to light. Right eye exhibits no discharge. Left eye exhibits no discharge. No scleral icterus.  Neck: Normal range of motion. Neck supple. No thyromegaly present.  Cardiovascular: Normal rate, regular rhythm, normal heart sounds and intact  distal pulses.   No murmur heard. Pulmonary/Chest: Effort normal and breath sounds normal. No respiratory distress. She has no wheezes. She has no rales.  Abdominal: Soft. Bowel sounds are normal. She exhibits no distension and no mass. There is no tenderness.  Musculoskeletal: Normal range of motion. She exhibits no edema and no tenderness.  Lymphadenopathy:    She has no cervical adenopathy.  Neurological: She is alert and oriented to person, place, and time. She has normal reflexes. No cranial nerve deficit. Coordination normal.  Skin: Skin is warm and dry. No rash noted. She is not diaphoretic.  Psychiatric: Mood, memory and affect normal.       Assessment & Plan  HYPERTENSION Adequately controlled despite acute illness today. No changes  Sinusitis acute Encouraged increased rest, fluids, probiotics and Mucinex. Started on antibiotics and patient is to notify us if symptoms do not improve. Given rx for Zpak and medrol dosepak  Anemia Will repeat CBC along with other fasting labs prior to next visit  Preventative health care Consider Tdap at next visit, had a Td in 2006, request old records. Check fasting labs prior to next visit. Has MGM scheduled for next month.  Lower back pain Gets Hydrocondone from [place] takes the meds sparinglg and is attempting to avoid surgery if possible  URINARY INCONTINENCE Has had full work up with [facility]  Urticaria, chronic Well controlled at this time  GERD Given refill on Ranitidine may take up to bid, continue Omeprazole  ALLERGIC RHINITIS Continue Allegra  Macular pucker, bilateral Is going to [facility] for second opinion before proceeding with surgery

## 2011-10-10 NOTE — Assessment & Plan Note (Signed)
Given refill on Ranitidine may take up to bid, continue Omeprazole

## 2011-10-18 DIAGNOSIS — H35373 Puckering of macula, bilateral: Secondary | ICD-10-CM | POA: Insufficient documentation

## 2011-10-18 DIAGNOSIS — H43819 Vitreous degeneration, unspecified eye: Secondary | ICD-10-CM | POA: Insufficient documentation

## 2011-11-20 ENCOUNTER — Ambulatory Visit: Payer: Managed Care, Other (non HMO)

## 2011-12-18 ENCOUNTER — Ambulatory Visit (INDEPENDENT_AMBULATORY_CARE_PROVIDER_SITE_OTHER): Payer: Managed Care, Other (non HMO) | Admitting: Family Medicine

## 2011-12-18 ENCOUNTER — Encounter: Payer: Self-pay | Admitting: Family Medicine

## 2011-12-18 VITALS — BP 142/89 | HR 63 | Temp 98.2°F | Ht 67.5 in | Wt 202.8 lb

## 2011-12-18 DIAGNOSIS — J40 Bronchitis, not specified as acute or chronic: Secondary | ICD-10-CM

## 2011-12-18 DIAGNOSIS — I10 Essential (primary) hypertension: Secondary | ICD-10-CM

## 2011-12-18 DIAGNOSIS — B958 Unspecified staphylococcus as the cause of diseases classified elsewhere: Secondary | ICD-10-CM

## 2011-12-18 DIAGNOSIS — A4901 Methicillin susceptible Staphylococcus aureus infection, unspecified site: Secondary | ICD-10-CM

## 2011-12-18 DIAGNOSIS — J209 Acute bronchitis, unspecified: Secondary | ICD-10-CM

## 2011-12-18 HISTORY — DX: Methicillin susceptible Staphylococcus aureus infection, unspecified site: A49.01

## 2011-12-18 MED ORDER — AZITHROMYCIN 250 MG PO TABS: ORAL_TABLET | ORAL | Status: DC

## 2011-12-18 MED ORDER — SULFAMETHOXAZOLE-TRIMETHOPRIM 800-160 MG PO TABS: 1.0000 | ORAL_TABLET | Freq: Two times a day (BID) | ORAL | Status: DC

## 2011-12-18 MED ORDER — MUPIROCIN 2 % EX OINT: TOPICAL_OINTMENT | Freq: Two times a day (BID) | CUTANEOUS | Status: DC

## 2011-12-18 MED ORDER — METHYLPREDNISOLONE 4 MG PO KIT: PACK | ORAL | Status: DC

## 2011-12-18 MED ORDER — HYDROCOD POLST-CHLORPHEN POLST 10-8 MG/5ML PO LQCR: 5.0000 mL | Freq: Two times a day (BID) | ORAL | Status: DC | PRN

## 2011-12-18 NOTE — Patient Instructions (Addendum)

## 2011-12-18 NOTE — Assessment & Plan Note (Signed)
Mild elevation with acute illness, will check labs and reevaluate at appt at end of month

## 2011-12-18 NOTE — Progress Notes (Signed)
Patient ID: Joan Robinson, female   DOB: [birth year], 51 y.o.   MRN: [id number] AMAYIAH CARBARY [id number] [birth year] 12/18/2011      Progress Note-Follow Up  Subjective  Chief Complaint  Chief Complaint  Patient presents with  . Cough    X 2 days w/phlegm (yellowish-green)  . Nasal Congestion    head congestion X 1 week and chest congestion X 2 days    HPI  Patient is a [age] Caucasian female who is in today for one-week history of worsening congestion. Symptoms are less frequent headache and head congestion. She diarrhea some ear pressure and some sore throat. His fatigue and malaise and myalgias as well as chills. In the last one to 2 days she began to develop worsening cough now productive of green phlegm. She's noted some wheezing and some chest pain with coughing. She denies nausea vomiting but does have an occasional episode of diarrhea. She did quit smoking 3 years ago and while she had more frequent bronchitis and pneumonia she has continued to have roughly one episode per year. She has tried some over-the-counter cough and cold preparation and gets temporary relief from that. She did take a Tylenol preparation roughly 3 hours prior to her visit here today.  Past Medical History  Diagnosis Date  . Allergic rhinitis   . Adenomatous colon polyp   . Diverticulosis   . Colitis, Clostridium difficile 5/11,6/11  . Hypothyroidism   . Urinary incontinence   . PUD (peptic ulcer disease)   . Tobacco abuse   . Hypertension   . Hyperlipidemia   . Colon polyp   . Anemia 10/10/2011  . Preventative health care 10/10/2011  . Sinusitis acute 10/10/2011  . Chicken pox as a child    X 2  . Mumps as a child  . Lower back pain   . Macular pucker, bilateral 10/10/2011    Past Surgical History  Procedure Date  . Tubal ligation 1995  . Uterine suspension     mesh  . Colonoscopy   . Gated spect wall motion stress cardiolite 11/05/2001  . Polypectomy   . Abdominal hysterectomy   .  Abdominal hysterectomy 2011    complete  . Dilation and curettage of uterus 1985    Family History  Problem Relation Age of Onset  . Heart disease Mother     stents  . Stroke Mother   . Hyperlipidemia Mother   . Hypertension Mother   . Other Mother     blood disorder- mgus  . Colon polyps Father   . Other Father     aorta disection  . Aneurysm Father   . Colon cancer Paternal Uncle   . Irritable bowel syndrome Daughter   . Other Daughter     gastritis  . Mental illness Daughter     bipolar and mood disorder  . Hypertension Sister     ?  Marland Kitchen Mental illness Son     bipolar  . Arthritis Sister   . Other Sister     thyroid  . Other Sister     thyroid    History   Social History  . Marital Status: Married    Spouse Name: N/A    Number of Children: 2  . Years of Education: N/A   Occupational History  . 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 Smoking status: Former Smoker -- 1.0 packs/day for 30 years    Types: Cigarettes    Quit  date: 05/15/2009  . Smokeless tobacco: Never Used  . Alcohol Use: No  . Drug Use: No  . Sexually Active: No   Other Topics Concern  . Not on file   Social History Narrative  . No narrative on file    Current Outpatient Prescriptions on File Prior to Visit  Medication Sig Dispense Refill  . calcium carbonate (TUMS E-X 750) 750 MG chewable tablet Chew 2 tablets by mouth daily.        Marland Kitchen levothyroxine (SYNTHROID, LEVOTHROID) 88 MCG tablet Take 88 mcg by mouth daily.      . metoprolol tartrate (LOPRESSOR) 25 MG tablet Take 25 mg by mouth. 1/2 tablet by mouth once daily      . Multiple Vitamin (MULTIVITAMIN) tablet Take 1 tablet by mouth daily.        Marland Kitchen omeprazole (PRILOSEC) 20 MG capsule Take 20 mg by mouth daily.      . ondansetron (ZOFRAN) 4 MG tablet Take 1 tablet (4 mg total) by mouth every 6 (six) hours as needed.  20 tablet  5  . Probiotic Product (PROBIOTIC FORMULA) CAPS Take 1 capsule by mouth daily.        .  ranitidine (ZANTAC) 150 MG tablet Take 1 tablet (150 mg total) by mouth 2 (two) times daily.  60 tablet  3  . sertraline (ZOLOFT) 100 MG tablet Take 100 mg by mouth daily.      Marland Kitchen ALPRAZolam (XANAX) 0.25 MG tablet Take 0.25 mg by mouth at bedtime as needed.        Marland Kitchen azithromycin (ZITHROMAX) 250 MG tablet 2 tabs po once and then 1 tab po daily x 4 days  6 tablet  0  . cholecalciferol (VITAMIN D) 1000 UNITS tablet Take 1,000 Units by mouth daily.        . clidinium-chlordiazePOXIDE (LIBRAX) 2.5-5 MG per capsule Take 1 capsule by mouth 3 (three) times daily before meals.  100 capsule  1  . Coenzyme Q10 (CO Q 10 PO) Take 200 mg by mouth daily.      . fexofenadine (ALLEGRA) 180 MG tablet Take 180 mg by mouth daily.      Marland Kitchen guaiFENesin (MUCINEX) 600 MG 12 hr tablet Take 1 tablet (600 mg total) by mouth 2 (two) times daily.  20 tablet  0  . HYDROcodone-acetaminophen (NORCO) 10-325 MG per tablet Take 1 tablet by mouth every 6 (six) hours as needed.        . nitroGLYCERIN (NITROSTAT) 0.4 MG SL tablet Place 0.4 mg under the tongue every 5 (five) minutes as needed.          Allergies  Allergen Reactions  . Keflex (Cephalexin) Diarrhea  . Aspirin     REACTION: difficulty breathing  . Dicyclomine Hcl     REACTION: mouth ulcers  . Metronidazole     REACTION: hives, mouth ulcers  . Moxifloxacin     REACTION: increased heart rate, nausea  . Nsaids     REACTION: difficulty breathing  . Phenergan (Promethazine Hcl)     "knocks" pt out for about 3 days  . Sulfamethoxazole W-Trimethoprim     REACTION: increased heart rate, nausea    Review of Systems  Review of Systems  Constitutional: Positive for chills and malaise/fatigue. Negative for fever.  HENT: Positive for congestion and sore throat. Negative for ear pain.   Eyes: Negative for discharge.  Respiratory: Positive for cough, sputum production, shortness of breath and wheezing.   Cardiovascular: Negative for chest  pain, palpitations and leg  swelling.  Gastrointestinal: Positive for diarrhea. Negative for nausea and abdominal pain.  Genitourinary: Negative for dysuria.  Musculoskeletal: Negative for falls.  Skin: Negative for rash.  Neurological: Positive for headaches. Negative for loss of consciousness.  Endo/Heme/Allergies: Negative for polydipsia.  Psychiatric/Behavioral: Negative for depression and suicidal ideas. The patient is not nervous/anxious and does not have insomnia.     Objective  BP 142/89  Pulse 63  Temp 98.2 F (36.8 C) (Temporal)  Ht 5' 7.5" (1.715 m)  Wt 202 lb 12.8 oz (91.989 kg)  BMI 31.29 kg/m2  SpO2 98%  Physical Exam  Physical Exam  Constitutional: She is well-developed, well-nourished, and in no distress. No distress.  HENT:  Left Ear: External ear normal.  Mouth/Throat: No oropharyngeal exudate.       Oropharynx is erythematous and boggy  Eyes: EOM are normal. Left eye exhibits no discharge. No scleral icterus.  Neck: No JVD present. No tracheal deviation present.  Cardiovascular: Normal heart sounds and intact distal pulses.   Pulmonary/Chest: No respiratory distress. She has no rales.  Abdominal: She exhibits no distension and no mass. There is tenderness. There is no guarding.  Musculoskeletal: She exhibits no edema and no tenderness.  Lymphadenopathy:    She has cervical adenopathy.  Skin: No rash noted. No erythema.     Lab Results  Component Value Date   WBC 4.5 05/31/2010   HGB 11.9* 05/31/2010   HCT 34.7* 05/31/2010   MCV 88.6 05/31/2010   PLT 191.0 05/31/2010   Lab Results  Component Value Date   CREATININE .8 12/16/2009   BUN 9 12/16/2009   NA 146* 12/16/2009   K 4.5 HEMOLYZED SPECIMEN, RESULTS MAY BE AFFECTED 12/16/2009   CL 107 12/16/2009   CO2 27 12/16/2009   Lab Results  Component Value Date   ALT 15 09/30/2009   AST 15 09/30/2009   ALKPHOS 52 09/30/2009   BILITOT 0.9 09/30/2009   Lab Results  Component Value Date   CHOL 195 09/26/2006   Lab Results  Component  Value Date   HDL 31.2* 09/26/2006   No results found for this basename: LDLCALC   Lab Results  Component Value Date   TRIG 216* 09/26/2006   Lab Results  Component Value Date   CHOLHDL 6.3 CALC 09/26/2006     Assessment & Plan  HYPERTENSION Mild elevation with acute illness, will check labs and reevaluate at appt at end of month  Bronchitis, acute Will start her on Zpak and Medrol dose pak, given Tussionex to use prn, increase rest and fluids and start a probiotic daily  Staph aureus infection Given Mupirocin ointment to use as needed

## 2011-12-18 NOTE — Assessment & Plan Note (Signed)
Given Mupirocin ointment to use as needed

## 2011-12-18 NOTE — Assessment & Plan Note (Signed)
Will start her on Zpak and Medrol dose pak, given Tussionex to use prn, increase rest and fluids and start a probiotic daily

## 2012-01-03 ENCOUNTER — Telehealth: Payer: Self-pay | Admitting: Family Medicine

## 2012-01-03 MED ORDER — SERTRALINE HCL 100 MG PO TABS: 100.0000 mg | ORAL_TABLET | Freq: Every day | ORAL | Status: DC

## 2012-01-03 NOTE — Telephone Encounter (Signed)
Patient asking for refill of Zoloft 100mg  daily.  Call to [facility] at [place] on [address].  Phone [phone].  Patient has appt tomorrow for labs.

## 2012-01-04 ENCOUNTER — Other Ambulatory Visit (INDEPENDENT_AMBULATORY_CARE_PROVIDER_SITE_OTHER): Payer: Managed Care, Other (non HMO)

## 2012-01-04 DIAGNOSIS — I10 Essential (primary) hypertension: Secondary | ICD-10-CM

## 2012-01-04 LAB — CBC
HCT: 34.5 % — ABNORMAL LOW (ref 36.0–46.0)
MCHC: 33.6 g/dL (ref 30.0–36.0)
MCV: 88 fl (ref 78.0–100.0)
RBC: 3.92 Mil/uL (ref 3.87–5.11)
RDW: 13.3 % (ref 11.5–14.6)

## 2012-01-04 LAB — LIPID PANEL
Cholesterol: 224 mg/dL — ABNORMAL HIGH (ref 0–200)
HDL: 36.8 mg/dL — ABNORMAL LOW (ref >39.00)
Total CHOL/HDL Ratio: 6
VLDL: 45.2 mg/dL — ABNORMAL HIGH (ref 0.0–40.0)

## 2012-01-04 LAB — RENAL FUNCTION PANEL
CO2: 29 mEq/L (ref 19–32)
Calcium: 9.1 mg/dL (ref 8.4–10.5)
Chloride: 105 mEq/L (ref 96–112)
Creatinine, Ser: 0.9 mg/dL (ref 0.4–1.2)
GFR: 68.39 mL/min (ref >60.00)
Sodium: 140 mEq/L (ref 135–145)

## 2012-01-04 LAB — POCT URINALYSIS DIPSTICK
Ketones, UA: NEGATIVE
Nitrite, UA: NEGATIVE
Protein, UA: NEGATIVE
Urobilinogen, UA: 0.2
pH, UA: 7

## 2012-01-04 LAB — HEPATIC FUNCTION PANEL
AST: 17 U/L (ref 0–37)
Alkaline Phosphatase: 82 U/L (ref 39–117)
Total Bilirubin: 0.4 mg/dL (ref 0.3–1.2)

## 2012-01-11 ENCOUNTER — Encounter: Payer: Self-pay | Admitting: Family Medicine

## 2012-01-11 ENCOUNTER — Ambulatory Visit (INDEPENDENT_AMBULATORY_CARE_PROVIDER_SITE_OTHER): Payer: Managed Care, Other (non HMO) | Admitting: Family Medicine

## 2012-01-11 VITALS — BP 141/78 | HR 68 | Temp 98.0°F | Ht 67.5 in | Wt 204.8 lb

## 2012-01-11 DIAGNOSIS — E785 Hyperlipidemia, unspecified: Secondary | ICD-10-CM

## 2012-01-11 DIAGNOSIS — F411 Generalized anxiety disorder: Secondary | ICD-10-CM

## 2012-01-11 DIAGNOSIS — F329 Major depressive disorder, single episode, unspecified: Secondary | ICD-10-CM

## 2012-01-11 DIAGNOSIS — N951 Menopausal and female climacteric states: Secondary | ICD-10-CM

## 2012-01-11 DIAGNOSIS — I10 Essential (primary) hypertension: Secondary | ICD-10-CM

## 2012-01-11 MED ORDER — ESCITALOPRAM OXALATE 20 MG PO TABS: 20.0000 mg | ORAL_TABLET | Freq: Every day | ORAL | Status: DC

## 2012-01-11 NOTE — Patient Instructions (Addendum)

## 2012-01-16 ENCOUNTER — Encounter: Payer: Self-pay | Admitting: Family Medicine

## 2012-01-16 DIAGNOSIS — N951 Menopausal and female climacteric states: Secondary | ICD-10-CM

## 2012-01-16 HISTORY — DX: Menopausal and female climacteric states: N95.1

## 2012-01-16 NOTE — Assessment & Plan Note (Signed)
Using Alprazolam prn infrequently with good results

## 2012-01-16 NOTE — Assessment & Plan Note (Signed)
Encouraged fatty acid supplements. Encouraged regular exercise. Encouraged small, frequent meals with good hydration and adequate sleep.

## 2012-01-16 NOTE — Assessment & Plan Note (Signed)
Adequately controlled, no changes 

## 2012-01-16 NOTE — Progress Notes (Signed)
Patient ID: Joan Robinson, female   DOB: 1960-12-31, 52 y.o.   MRN: [id number] Joan Robinson [id number] [birth year] 01/16/2012      Progress Note-Follow Up  Subjective  Chief Complaint  Chief Complaint  Patient presents with  . Follow-up    3 month    HPI  Patient is a 52 year old Caucasian female who is in today in followup. Overall she feels well. She's not had any recent illness, fevers, chills, chest pain, palpitations, shortness breath. She does continue to struggle with some mild hot flashes but is not interested in restarting hormones. Finds them tolerable and they are better with some lifestyle changes. She's having them much less frequently. No other illness. Or concerns noted today  Past Medical History  Diagnosis Date  . Allergic rhinitis   . Adenomatous colon polyp   . Diverticulosis   . Colitis, Clostridium difficile 5/11,6/11  . Hypothyroidism   . Urinary incontinence   . PUD (peptic ulcer disease)   . Tobacco abuse   . Hypertension   . Hyperlipidemia   . Colon polyp   . Anemia 10/10/2011  . Preventative health care 10/10/2011  . Sinusitis acute 10/10/2011  . Chicken pox as a child    X 2  . Mumps as a child  . Lower back pain   . 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 Staph aureus infection 12/18/2011    Recurrent lesions in nares  . Perimenopausal 01/16/2012    Past Surgical History  Procedure Date  . Tubal ligation 1995  . Uterine suspension     mesh  . Colonoscopy   . Gated spect wall motion stress cardiolite 11/05/2001  . Polypectomy   . Abdominal hysterectomy   . Abdominal hysterectomy 2011    complete  . Dilation and curettage of uterus 1985    Family History  Problem Relation Age of Onset  . Heart disease Mother     stents  . Stroke Mother   . Hyperlipidemia Mother   . Hypertension Mother   . Other Mother     blood disorder- mgus  . Colon polyps Father   . Other Father     aorta disection  . Aneurysm Father   . Colon cancer Paternal  Uncle   . Irritable bowel syndrome Daughter   . Other Daughter     gastritis  . Mental illness Daughter     bipolar and mood disorder  . Hypertension Sister     ?  Marland Kitchen Mental illness Son     bipolar  . Arthritis Sister   . Other Sister     thyroid  . Other Sister     thyroid    History   Social History  . Marital Status: Married    Spouse Name: N/A    Number of Children: 2  . Years of Education: N/A   Occupational History  . 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 Smoking status: Former Smoker -- 1.0 packs/day for 30 years    Types: Cigarettes    Quit date: 05/15/2009  . Smokeless tobacco: Never Used  . Alcohol Use: No  . Drug Use: No  . Sexually Active: No   Other Topics Concern  . Not on file   Social History Narrative  . No narrative on file    Current Outpatient Prescriptions on File Prior to Visit  Medication Sig Dispense Refill  . ALPRAZolam (XANAX) 0.25 MG tablet Take 0.25 mg by mouth at bedtime  as needed.        . calcium carbonate (TUMS E-X 750) 750 MG chewable tablet Chew 2 tablets by mouth daily.        . fexofenadine (ALLEGRA) 180 MG tablet Take 180 mg by mouth daily.      Marland Kitchen guaiFENesin (MUCINEX) 600 MG 12 hr tablet Take 1 tablet (600 mg total) by mouth 2 (two) times daily.  20 tablet  0  . HYDROcodone-acetaminophen (NORCO) 10-325 MG per tablet Take 1 tablet by mouth every 6 (six) hours as needed.        Marland Kitchen levothyroxine (SYNTHROID, LEVOTHROID) 88 MCG tablet Take 88 mcg by mouth daily.      . metoprolol tartrate (LOPRESSOR) 25 MG tablet Take 25 mg by mouth. 1/2 tablet by mouth once daily      . Multiple Vitamins-Minerals (ONE-A-DAY MENOPAUSE FORMULA) TABS Take 1 tablet by mouth daily.      . mupirocin ointment (BACTROBAN) 2 % Apply topically 2 (two) times daily. Apply to nares with clean qtip for 7-10 days  22 g  0  . omeprazole (PRILOSEC) 20 MG capsule Take 20 mg by mouth daily.      . ondansetron (ZOFRAN) 4 MG tablet Take 1 tablet (4 mg  total) by mouth every 6 (six) hours as needed.  20 tablet  5  . ranitidine (ZANTAC) 150 MG tablet Take 1 tablet (150 mg total) by mouth 2 (two) times daily.  60 tablet  3  . Coenzyme Q10 (CO Q 10 PO) Take 200 mg by mouth daily.      Marland Kitchen escitalopram (LEXAPRO) 20 MG tablet Take 1 tablet (20 mg total) by mouth daily.  30 tablet  3  . nitroGLYCERIN (NITROSTAT) 0.4 MG SL tablet Place 0.4 mg under the tongue every 5 (five) minutes as needed.        . Probiotic Product (PROBIOTIC FORMULA) CAPS Take 1 capsule by mouth daily.          Allergies  Allergen Reactions  . Keflex (Cephalexin) Diarrhea  . Aspirin     REACTION: difficulty breathing  . Dicyclomine Hcl     REACTION: mouth ulcers  . Metronidazole     REACTION: hives, mouth ulcers  . Moxifloxacin     REACTION: increased heart rate, nausea  . Nsaids     REACTION: difficulty breathing  . Phenergan (Promethazine Hcl)     "knocks" pt out for about 3 days  . Sulfamethoxazole W-Trimethoprim     REACTION: increased heart rate, nausea    Review of Systems  Review of Systems  Constitutional: Positive for diaphoresis. Negative for fever and malaise/fatigue.  HENT: Negative for congestion.   Eyes: Negative for discharge.  Respiratory: Negative for shortness of breath.   Cardiovascular: Negative for chest pain, palpitations and leg swelling.  Gastrointestinal: Negative for nausea, abdominal pain and diarrhea.  Genitourinary: Negative for dysuria.  Musculoskeletal: Negative for falls.  Skin: Negative for rash.  Neurological: Negative for loss of consciousness and headaches.  Endo/Heme/Allergies: Negative for polydipsia.  Psychiatric/Behavioral: Negative for depression and suicidal ideas. The patient is not nervous/anxious and does not have insomnia.     Objective  BP 141/78  Pulse 68  Temp 98 F (36.7 C) (Temporal)  Ht 5' 7.5" (1.715 m)  Wt 204 lb 12.8 oz (92.897 kg)  BMI 31.60 kg/m2  SpO2 97%  Physical Exam  Physical Exam    Constitutional: She is oriented to person, place, and time and well-developed, well-nourished, and in no  distress. No distress.  HENT:  Head: Normocephalic and atraumatic.  Eyes: Conjunctivae normal are normal.  Neck: Neck supple. No thyromegaly present.  Cardiovascular: Normal rate, regular rhythm and normal heart sounds.   No murmur heard. Pulmonary/Chest: Effort normal and breath sounds normal. She has no wheezes.  Abdominal: She exhibits no distension and no mass.  Musculoskeletal: She exhibits no edema.  Lymphadenopathy:    She has no cervical adenopathy.  Neurological: She is alert and oriented to person, place, and time.  Skin: Skin is warm and dry. No rash noted. She is not diaphoretic.  Psychiatric: Memory, affect and judgment normal.    Lab Results  Component Value Date   TSH 1.06 01/04/2012   Lab Results  Component Value Date   WBC 4.5 01/04/2012   HGB 11.6* 01/04/2012   HCT 34.5* 01/04/2012   MCV 88.0 01/04/2012   PLT 198.0 01/04/2012   Lab Results  Component Value Date   CREATININE 0.9 01/04/2012   BUN 9 01/04/2012   NA 140 01/04/2012   K 3.9 01/04/2012   CL 105 01/04/2012   CO2 29 01/04/2012   Lab Results  Component Value Date   ALT 18 01/04/2012   AST 17 01/04/2012   ALKPHOS 82 01/04/2012   BILITOT 0.4 01/04/2012   Lab Results  Component Value Date   CHOL 224* 01/04/2012   Lab Results  Component Value Date   HDL 36.80* 01/04/2012   No results found for this basename: [facility]   Lab Results  Component Value Date   TRIG 226.0* 01/04/2012   Lab Results  Component Value Date   CHOLHDL 6 01/04/2012     Assessment & Plan HYPERTENSION Adequately controlled, no changes  ANXIETY Using Alprazolam prn infrequently with good results  Perimenopausal Encouraged fatty acid supplements. Encouraged regular exercise. Encouraged small, frequent meals with good hydration and adequate sleep.  HYPERLIPIDEMIA Labs reviewed with patient. Encouraged  to avoid transplants and minimize simple carbs and saturated fats. Start Omega red. Increase exercise. Reassess in 6 months or as needed

## 2012-01-16 NOTE — Assessment & Plan Note (Signed)
Labs reviewed with patient. Encouraged to avoid transplants and minimize simple carbs and saturated fats. Start Omega red. Increase exercise. Reassess in 6 months or as needed

## 2012-01-17 ENCOUNTER — Ambulatory Visit (INDEPENDENT_AMBULATORY_CARE_PROVIDER_SITE_OTHER): Payer: Managed Care, Other (non HMO)

## 2012-01-17 DIAGNOSIS — Z1231 Encounter for screening mammogram for malignant neoplasm of breast: Secondary | ICD-10-CM

## 2012-02-19 ENCOUNTER — Ambulatory Visit: Payer: Managed Care, Other (non HMO) | Admitting: Internal Medicine

## 2012-03-12 ENCOUNTER — Ambulatory Visit: Payer: Managed Care, Other (non HMO) | Admitting: Internal Medicine

## 2012-03-18 ENCOUNTER — Encounter: Payer: Self-pay | Admitting: Internal Medicine

## 2012-03-18 ENCOUNTER — Ambulatory Visit (INDEPENDENT_AMBULATORY_CARE_PROVIDER_SITE_OTHER): Payer: Managed Care, Other (non HMO) | Admitting: Internal Medicine

## 2012-03-18 VITALS — BP 112/62 | HR 78 | Ht 67.0 in | Wt 207.0 lb

## 2012-03-18 DIAGNOSIS — K625 Hemorrhage of anus and rectum: Secondary | ICD-10-CM

## 2012-03-18 DIAGNOSIS — Z860100 Personal history of colon polyps, unspecified: Secondary | ICD-10-CM

## 2012-03-18 DIAGNOSIS — K589 Irritable bowel syndrome without diarrhea: Secondary | ICD-10-CM

## 2012-03-18 DIAGNOSIS — R1084 Generalized abdominal pain: Secondary | ICD-10-CM

## 2012-03-18 MED ORDER — MOVIPREP 100 G PO SOLR: 1.0000 | Freq: Once | ORAL | Status: DC

## 2012-03-18 NOTE — Progress Notes (Signed)
HISTORY OF PRESENT ILLNESS:  Joan Robinson is a 52 y.o. female with the below listed medical history who is followed in this office for IBS (more recently diarrhea predominant), remote peptic ulcer disease, recurrent Clostridium difficile related diarrhea, and adenomatous colon polyps. The patient was last seen in August of 2012 for followup regarding diarrhea. See that dictation. She presents today with chief complaint of rectal bleeding. She has noticed this intermittently over the past 6 months. She describes blood in the toilet bowl as well as on the tissue when wiping. Initially, she was not certain whether this was GYN or urologic in origin. She also mentions episodes of abdominal cramping, particularly after walking, and the presence of mucoid blood thereafter. She mentions greater than one year history of intermittent incontinence. She continues with postprandial abdominal cramping with urgency and loose stools. She has had steady weight gain. Quit smoking 3 years ago. Finally, she mentions pain in the rectal area after defecation. Previously been told that she has a fissure. Her last complete colonoscopy was August 2011. At that time she was noted to have a diminutive tubular adenoma which was removed. As well mild sigmoid diverticulosis. Her last upper endoscopy in July of 2010 was remarkable only for a small hiatal hernia and benign fundic gland nodules.  REVIEW OF SYSTEMS:  All non-GI ROS negative except for sinus and allergy trouble, headaches, fatigue, visual change, urinary leakage  Past Medical History  Diagnosis Date  . Allergic rhinitis   . Adenomatous colon polyp   . Diverticulosis   . Colitis, Clostridium difficile 5/11,6/11  . Hypothyroidism   . Urinary incontinence   . PUD (peptic ulcer disease)   . Tobacco abuse   . Hypertension   . Hyperlipidemia   . Colon polyp   . Anemia 10/10/2011  . Preventative health care 10/10/2011  . Sinusitis acute 10/10/2011  . Chicken pox as a  child    X 2  . Mumps as a child  . Lower back pain   . 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 Staph aureus infection 12/18/2011    Recurrent lesions in nares  . Perimenopausal 01/16/2012    Past Surgical History  Procedure Laterality Date  . Tubal ligation  1995  . Uterine suspension      mesh  . Colonoscopy    . Gated spect wall motion stress cardiolite  11/05/2001  . Polypectomy    . Abdominal hysterectomy    . Abdominal hysterectomy  2011    complete  . Dilation and curettage of uterus  1985    Social History [name]  reports that she quit smoking about 2 years ago. Her smoking use included Cigarettes. She has a 30 pack-year smoking history. She has never used smokeless tobacco. She reports that she does not drink alcohol or use illicit drugs.  family history includes Aneurysm in her father; Arthritis in her sister; Colon cancer in her paternal uncle; Colon polyps in her father; Heart disease in her mother; Hyperlipidemia in her mother; Hypertension in her mother and sister; Irritable bowel syndrome in her daughter; Mental illness in her daughter and son; Other in her daughter, father, mother, and sisters; and Stroke in her mother.  Allergies  Allergen Reactions  . Keflex (Cephalexin) Diarrhea  . Aspirin     REACTION: difficulty breathing  . Dicyclomine Hcl     REACTION: mouth ulcers  . Metronidazole     REACTION: hives, mouth ulcers  . Moxifloxacin  REACTION: increased heart rate, nausea  . Nsaids     REACTION: difficulty breathing  . Phenergan (Promethazine Hcl)     "knocks" pt out for about 3 days  . Sulfamethoxazole W-Trimethoprim     REACTION: increased heart rate, nausea       PHYSICAL EXAMINATION: Vital signs: BP 112/62  Pulse 78  Ht 5\' 7"  (1.702 m)  Wt 207 lb (93.895 kg)  BMI 32.41 kg/m2  SpO2 97%  Constitutional: generally well-appearing, no acute distress Psychiatric: alert and oriented x3, cooperative Eyes: extraocular movements intact,  anicteric, conjunctiva pink Mouth: oral pharynx moist, no lesions Neck: supple no lymphadenopathy Cardiovascular: heart regular rate and rhythm, no murmur Lungs: clear to auscultation bilaterally Abdomen: soft, nontender, nondistended, no obvious ascites, no peritoneal signs, normal bowel sounds, no organomegaly Rectal: Deferred until colonoscopy Extremities: no lower extremity edema bilaterally Skin: no lesions on visible extremities Neuro: No focal deficits.   ASSESSMENT:  #1. 6+ month history of intermittent rectal bleeding. As well intermittent rectal discomfort. Rule out fissure. Rule out neoplasia #2. History of advanced colorectal neoplasia (multiple polyps and large adenomatous 2003 index exam). Negative examination 2006. Diminutive adenoma 2011. #3. Diarrhea predominant IBS. Ongoing #4. History of C. difficile related diarrhea  PLAN:  #1. Colonoscopy.The nature of the procedure, as well as the risks, benefits, and alternatives were carefully and thoroughly reviewed with the patient. Ample time for discussion and questions allowed. The patient understood, was satisfied, and agreed to proceed. #2. Movi prep prescribed. The patient instructed on its use. #3. If fissure confirmed, then prescribe diltiazem cream

## 2012-03-18 NOTE — Patient Instructions (Addendum)
You have been scheduled for a colonoscopy with propofol. Please follow written instructions given to you at your visit today.  Please pick up your prep kit at the pharmacy within the next 1-3 days. If you use inhalers (even only as needed) or a CPAP machine, please bring them with you on the day of your procedure.  

## 2012-03-19 ENCOUNTER — Ambulatory Visit (AMBULATORY_SURGERY_CENTER): Payer: Managed Care, Other (non HMO) | Admitting: Internal Medicine

## 2012-03-19 ENCOUNTER — Encounter: Payer: Self-pay | Admitting: Internal Medicine

## 2012-03-19 VITALS — BP 117/72 | HR 55 | Temp 96.4°F | Resp 18 | Ht 67.0 in | Wt 207.0 lb

## 2012-03-19 DIAGNOSIS — R197 Diarrhea, unspecified: Secondary | ICD-10-CM

## 2012-03-19 DIAGNOSIS — D126 Benign neoplasm of colon, unspecified: Secondary | ICD-10-CM

## 2012-03-19 DIAGNOSIS — Z86010 Personal history of colonic polyps: Secondary | ICD-10-CM

## 2012-03-19 DIAGNOSIS — K589 Irritable bowel syndrome without diarrhea: Secondary | ICD-10-CM

## 2012-03-19 DIAGNOSIS — K625 Hemorrhage of anus and rectum: Secondary | ICD-10-CM

## 2012-03-19 DIAGNOSIS — R109 Unspecified abdominal pain: Secondary | ICD-10-CM

## 2012-03-19 MED ORDER — SODIUM CHLORIDE 0.9 % IV SOLN: 500.0000 mL | INTRAVENOUS | Status: DC

## 2012-03-19 NOTE — Op Note (Signed)
[facility] [address] [place], [address]   COLONOSCOPY PROCEDURE REPORT  PATIENT: Joan Robinson, Joan Robinson  MR#: [id number] BIRTHDATE: October 16, 1960 , 51  yrs. old GENDER: Female ENDOSCOPIST: Roxy Cedar, MD REFERRED BY:.  Self / Office PROCEDURE DATE:  03/19/2012 PROCEDURE:   Colonoscopy with biopsies ASA CLASS:   Class II INDICATIONS:rectal bleeding, Chronic diarrhea, and Patient's personal history of adenomatous colon polyps (index 2003 with multiple and large; f/u 2006 negative). MEDICATIONS: MAC sedation, administered by CRNA and propofol (Diprivan) 350mg  IV  DESCRIPTION OF PROCEDURE:   After the risks benefits and alternatives of the procedure were thoroughly explained, informed consent was obtained.  A digital rectal exam revealed no abnormalities of the rectum.   The LB [id number]  endoscope was introduced through the anus and advanced to the cecum, which was identified by both the appendix and ileocecal valve. No adverse events experienced.   The quality of the prep was good, using MoviPrep  The instrument was then slowly withdrawn as the colon was fully examined.      COLON FINDINGS: Mild diverticulosis was noted in the sigmoid colon. The mucosa appeared normal in the terminal ileum.   The colon mucosa was otherwise normal. Random colon bx taken. Retroflexed views revealed internal hemorrhoids. The time to cecum=3 minutes 09 seconds.  Withdrawal time=11 minutes 05 seconds.  The scope was withdrawn and the procedure completed. COMPLICATIONS: There were no complications.  ENDOSCOPIC IMPRESSION: 1.   Mild diverticulosis was noted in the sigmoid colon 2.   Normal mucosa in the terminal ileum 3.   The colon mucosa was otherwise normal s/p random bx  RECOMMENDATIONS: 1.  Await biopsy results 2.  Follow up colonoscopy in 5 years   eSigned:  Roxy Cedar, MD 03/19/2012 11:07 AM   cc: Reuel Derby, MD and The Patient   PATIENT NAME:   Joan Robinson, Joan Robinson MR#: [id number]

## 2012-03-19 NOTE — Patient Instructions (Addendum)

## 2012-03-19 NOTE — Progress Notes (Signed)
Patient did not experience any of the following events: a burn prior to discharge; a fall within the facility; wrong site/side/patient/procedure/implant event; or a hospital transfer or hospital admission upon discharge from the facility. (G8907) Patient did not have preoperative order for IV antibiotic SSI prophylaxis. (G8918)  

## 2012-03-19 NOTE — Progress Notes (Signed)
Called to room to assist during endoscopic procedure.  Patient ID and intended procedure confirmed with present staff. Received instructions for my participation in the procedure from the performing physician. ewm 

## 2012-03-20 ENCOUNTER — Telehealth: Payer: Self-pay | Admitting: *Deleted

## 2012-03-20 NOTE — Telephone Encounter (Signed)
  Follow up Call-  Call back number 03/19/2012  Post procedure Call Back phone  # [phone]  Permission to leave phone message Yes     Patient questions:  Do you have a fever, pain , or abdominal swelling? no Pain Score  0 *  Have you tolerated food without any problems? yes  Have you been able to return to your normal activities? yes  Do you have any questions about your discharge instructions: Diet   no Medications  no Follow up visit  no  Do you have questions or concerns about your Care? no  Actions: * If pain score is 4 or above: No action needed, pain <4.

## 2012-03-21 ENCOUNTER — Ambulatory Visit: Payer: Managed Care, Other (non HMO) | Admitting: Family Medicine

## 2012-03-24 ENCOUNTER — Encounter: Payer: Self-pay | Admitting: Internal Medicine

## 2012-04-11 ENCOUNTER — Ambulatory Visit (INDEPENDENT_AMBULATORY_CARE_PROVIDER_SITE_OTHER): Payer: Managed Care, Other (non HMO) | Admitting: Family Medicine

## 2012-04-11 ENCOUNTER — Encounter: Payer: Self-pay | Admitting: Family Medicine

## 2012-04-11 VITALS — BP 128/85 | HR 72 | Temp 98.2°F | Ht 67.5 in | Wt 207.4 lb

## 2012-04-11 DIAGNOSIS — F988 Other specified behavioral and emotional disorders with onset usually occurring in childhood and adolescence: Secondary | ICD-10-CM

## 2012-04-11 DIAGNOSIS — F329 Major depressive disorder, single episode, unspecified: Secondary | ICD-10-CM

## 2012-04-11 DIAGNOSIS — M928 Other specified juvenile osteochondrosis: Secondary | ICD-10-CM

## 2012-04-11 DIAGNOSIS — E785 Hyperlipidemia, unspecified: Secondary | ICD-10-CM

## 2012-04-11 DIAGNOSIS — F341 Dysthymic disorder: Secondary | ICD-10-CM

## 2012-04-11 DIAGNOSIS — I10 Essential (primary) hypertension: Secondary | ICD-10-CM

## 2012-04-11 DIAGNOSIS — K219 Gastro-esophageal reflux disease without esophagitis: Secondary | ICD-10-CM

## 2012-04-11 DIAGNOSIS — M9270 Juvenile osteochondrosis of metatarsus, unspecified foot: Secondary | ICD-10-CM

## 2012-04-11 DIAGNOSIS — E039 Hypothyroidism, unspecified: Secondary | ICD-10-CM

## 2012-04-11 DIAGNOSIS — M549 Dorsalgia, unspecified: Secondary | ICD-10-CM

## 2012-04-11 DIAGNOSIS — D649 Anemia, unspecified: Secondary | ICD-10-CM

## 2012-04-11 MED ORDER — METHYLPHENIDATE HCL 10 MG PO TABS: 10.0000 mg | ORAL_TABLET | Freq: Two times a day (BID) | ORAL | Status: DC

## 2012-04-11 MED ORDER — METOPROLOL TARTRATE 25 MG PO TABS: 12.5000 mg | ORAL_TABLET | Freq: Every day | ORAL | Status: DC

## 2012-04-11 MED ORDER — ALPRAZOLAM 0.25 MG PO TABS: 0.2500 mg | ORAL_TABLET | Freq: Every evening | ORAL | Status: DC | PRN

## 2012-04-11 MED ORDER — HYDROCODONE-ACETAMINOPHEN 10-325 MG PO TABS: 1.0000 | ORAL_TABLET | Freq: Four times a day (QID) | ORAL | Status: DC | PRN

## 2012-04-11 MED ORDER — OMEPRAZOLE 40 MG PO CPDR: 40.0000 mg | DELAYED_RELEASE_CAPSULE | Freq: Every day | ORAL | Status: DC

## 2012-04-11 MED ORDER — RANITIDINE HCL 150 MG PO TABS: 150.0000 mg | ORAL_TABLET | Freq: Two times a day (BID) | ORAL | Status: DC

## 2012-04-11 MED ORDER — LEVOTHYROXINE SODIUM 88 MCG PO TABS: 88.0000 ug | ORAL_TABLET | Freq: Every day | ORAL | Status: DC

## 2012-04-11 NOTE — Patient Instructions (Addendum)
Attention Deficit Hyperactivity Disorder Attention deficit hyperactivity disorder (ADHD) is a problem with behavior issues based on the way the brain functions (neurobehavioral disorder). It is a common reason for behavior and academic problems in school. CAUSES  The cause of ADHD is unknown in most cases. It may run in families. It sometimes can be associated with learning disabilities and other behavioral problems. SYMPTOMS  There are 3 types of ADHD. The 3 types and some of the symptoms include:  Inattentive  Gets bored or distracted easily.  Loses or forgets things. Forgets to hand in homework.  Has trouble organizing or completing tasks.  Difficulty staying on task.  An inability to organize daily tasks and school work.  Leaving projects, chores, or homework unfinished.  Trouble paying attention or responding to details. Careless mistakes.  Difficulty following directions. Often seems like is not listening.  Dislikes activities that require sustained attention (like chores or homework).  Hyperactive-impulsive  Feels like it is impossible to sit still or stay in a seat. Fidgeting with hands and feet.  Trouble waiting turn.  Talking too much or out of turn. Interruptive.  Speaks or acts impulsively.  Aggressive, disruptive behavior.  Constantly busy or on the go, noisy.  Combined  Has symptoms of both of the above. Often children with ADHD feel discouraged about themselves and with school. They often perform well below their abilities in school. These symptoms can cause problems in home, school, and in relationships with peers. As children get older, the excess motor activities can calm down, but the problems with paying attention and staying organized persist. Most children do not outgrow ADHD but with good treatment can learn to cope with the symptoms. DIAGNOSIS  When ADHD is suspected, the diagnosis should be made by professionals trained in ADHD.  Diagnosis will  include:  Ruling out other reasons for the child's behavior.  The caregivers will check with the child's school and check their medical records.  They will talk to teachers and parents.  Behavior rating scales for the child will be filled out by those dealing with the child on a daily basis. A diagnosis is made only after all information has been considered. TREATMENT  Treatment usually includes behavioral treatment often along with medicines. It may include stimulant medicines. The stimulant medicines decrease impulsivity and hyperactivity and increase attention. Other medicines used include antidepressants and certain blood pressure medicines. Most experts agree that treatment for ADHD should address all aspects of the child's functioning. Treatment should not be limited to the use of medicines alone. Treatment should include structured classroom management. The parents must receive education to address rewarding good behavior, discipline, and limit-setting. Tutoring or behavioral therapy or both should be available for the child. If untreated, the disorder can have long-term serious effects into adolescence and adulthood. HOME CARE INSTRUCTIONS   Often with ADHD there is a lot of frustration among the family in dealing with the illness. There is often blame and anger that is not warranted. This is a life long illness. There is no way to prevent ADHD. In many cases, because the problem affects the family as a whole, the entire family may need help. A therapist can help the family find better ways to handle the disruptive behaviors and promote change. If the child is young, most of the therapist's work is with the parents. Parents will learn techniques for coping with and improving their child's behavior. Sometimes only the child with the ADHD needs counseling. Your caregivers can help   you make these decisions.  Children with ADHD may need help in organizing. Some helpful tips include:  Keep  routines the same every day from wake-up time to bedtime. Schedule everything. This includes homework and playtime. This should include outdoor and indoor recreation. Keep the schedule on the refrigerator or a bulletin board where it is frequently seen. Mark schedule changes as far in advance as possible.  Have a place for everything and keep everything in its place. This includes clothing, backpacks, and school supplies.  Encourage writing down assignments and bringing home needed books.  Offer your child a well-balanced diet. Breakfast is especially important for school performance. Children should avoid drinks with caffeine including:  Soft drinks.  Coffee.  Tea.  However, some older children (adolescents) may find these drinks helpful in improving their attention.  Children with ADHD need consistent rules that they can understand and follow. If rules are followed, give small rewards. Children with ADHD often receive, and expect, criticism. Look for good behavior and praise it. Set realistic goals. Give clear instructions. Look for activities that can foster success and self-esteem. Make time for pleasant activities with your child. Give lots of affection.  Parents are their children's greatest advocates. Learn as much as possible about ADHD. This helps you become a stronger and better advocate for your child. It also helps you educate your child's teachers and instructors if they feel inadequate in these areas. Parent support groups are often helpful. A national group with local chapters is called CHADD (Children and Adults with Attention Deficit Hyperactivity Disorder). PROGNOSIS  There is no cure for ADHD. Children with the disorder seldom outgrow it. Many find adaptive ways to accommodate the ADHD as they mature. SEEK MEDICAL CARE IF:  Your child has repeated muscle twitches, cough or speech outbursts.  Your child has sleep problems.  Your child has a marked loss of  appetite.  Your child develops depression.  Your child has new or worsening behavioral problems.  Your child develops dizziness.  Your child has a racing heart.  Your child has stomach pains.  Your child develops headaches. Document Released: 12/22/2001 Document Revised: 03/26/2011 Document Reviewed: 08/04/2007 Exit[employer] Patient Information 2013 [employer].  

## 2012-04-13 ENCOUNTER — Encounter: Payer: Self-pay | Admitting: Family Medicine

## 2012-04-13 DIAGNOSIS — F988 Other specified behavioral and emotional disorders with onset usually occurring in childhood and adolescence: Secondary | ICD-10-CM | POA: Insufficient documentation

## 2012-04-13 DIAGNOSIS — M9270 Juvenile osteochondrosis of metatarsus, unspecified foot: Secondary | ICD-10-CM

## 2012-04-13 HISTORY — DX: Juvenile osteochondrosis of metatarsus, unspecified foot: M92.70

## 2012-04-13 NOTE — Assessment & Plan Note (Signed)
Still having symptoms on Omeprazole at 20 with Ranitidine. Will increase the Omeprazole to 40 mg daily and avoid offending foods.

## 2012-04-13 NOTE — Progress Notes (Signed)
Patient ID: Joan Robinson, female   DOB: Jul 18, 1960, 52 y.o.   MRN: [id number] Joan Robinson [id number] 12-Sep-1960 04/13/2012      Progress Note-Follow Up  Subjective  Chief Complaint  Chief Complaint  Patient presents with  . Follow-up    HPI  Patient is a 52 year old Caucasian female who is in today for followup. She is struggling with intermittent abdominal issues and recent colonoscopy did confirm some microscopic colitis and IBS. Bowels are moving adequately at this time. She is having a hard time with focus and is concerned that she has ADD. She does note she has a daughter with ADHD and 2 grandchildren who have been diagnosed with it as well. In retrospect she's also had trouble concentrating and finishing school work. At work and at home she has trouble completing tasks and focusing. Also struggles with persistent daily. Denies chest pain or palpitations. No shortness of breath or GU complaints. Does have heartburn but finds them to be helpful.  Past Medical History  Diagnosis Date  . Allergic rhinitis   . Adenomatous colon polyp   . Diverticulosis   . Colitis, Clostridium difficile 5/11,6/11  . Hypothyroidism   . Urinary incontinence   . PUD (peptic ulcer disease)   . Tobacco abuse   . Hypertension   . Hyperlipidemia   . Colon polyp   . Anemia 10/10/2011  . Preventative health care 10/10/2011  . Sinusitis acute 10/10/2011  . Chicken pox as a child    X 2  . Mumps as a child  . Lower back pain   . 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 Staph aureus infection 12/18/2011    Recurrent lesions in nares  . Perimenopausal 01/16/2012  . Anxiety and depression 01/17/2007    Qualifier: Diagnosis of  By: Everardo All MD, Cleophas Dunker     Past Surgical History  Procedure Laterality Date  . Tubal ligation  1995  . Uterine suspension      mesh  . Colonoscopy    . Gated spect wall motion stress cardiolite  11/05/2001  . Polypectomy    . Abdominal hysterectomy    . Abdominal hysterectomy   2011    complete  . Dilation and curettage of uterus  1985    Family History  Problem Relation Age of Onset  . Heart disease Mother     stents  . Stroke Mother   . Hyperlipidemia Mother   . Hypertension Mother   . Other Mother     blood disorder- mgus  . Colon polyps Father   . Other Father     aorta disection  . Aneurysm Father   . Colon cancer Paternal Uncle   . Irritable bowel syndrome Daughter   . Other Daughter     gastritis  . Mental illness Daughter     bipolar and mood disorder  . Hypertension Sister     ?  Marland Kitchen Mental illness Son     bipolar  . Arthritis Sister   . Other Sister     thyroid  . Other Sister     thyroid    History   Social History  . Marital Status: Married    Spouse Name: N/A    Number of Children: 2  . Years of Education: N/A   Occupational History  . 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 Smoking status: Former Smoker -- 1.00 packs/day for 30 years    Types: Cigarettes    Quit date:  05/15/2009  . Smokeless tobacco: Never Used  . Alcohol Use: No  . Drug Use: No  . Sexually Active: No   Other Topics Concern  . Not on file   Social History Narrative  . No narrative on file    Current Outpatient Prescriptions on File Prior to Visit  Medication Sig Dispense Refill  . Biotin 1000 MCG tablet Take 2,000 mcg by mouth daily.      . calcium carbonate (TUMS E-X 750) 750 MG chewable tablet Chew 2 tablets by mouth daily.        . Coenzyme Q10 (CO Q 10 PO) Take 200 mg by mouth daily.      Marland Kitchen escitalopram (LEXAPRO) 20 MG tablet Take 1 tablet (20 mg total) by mouth daily.  30 tablet  3  . fexofenadine (ALLEGRA) 180 MG tablet Take 180 mg by mouth daily.      . fish oil-omega-3 fatty acids 1000 MG capsule Take 1 g by mouth daily.      . Loperamide HCl (IMODIUM A-D PO) Take by mouth.      . Multiple Vitamins-Minerals (ONE-A-DAY MENOPAUSE FORMULA) TABS Take 1 tablet by mouth daily. 3 weekly      . mupirocin ointment (BACTROBAN) 2  % Apply topically 2 (two) times daily. Apply to nares with clean qtip for 7-10 days  22 g  0  . nitroGLYCERIN (NITROSTAT) 0.4 MG SL tablet Place 0.4 mg under the tongue every 5 (five) minutes as needed.        . ondansetron (ZOFRAN) 4 MG tablet Take 1 tablet (4 mg total) by mouth every 6 (six) hours as needed.  20 tablet  5  . Probiotic Product (PROBIOTIC FORMULA) CAPS Take 1 capsule by mouth daily.         No current facility-administered medications on file prior to visit.    Allergies  Allergen Reactions  . Keflex (Cephalexin) Diarrhea  . Aspirin     REACTION: difficulty breathing  . Dicyclomine Hcl     REACTION: mouth ulcers  . Metronidazole     REACTION: hives, mouth ulcers  . Moxifloxacin     REACTION: increased heart rate, nausea  . Nsaids     REACTION: difficulty breathing  . Phenergan (Promethazine Hcl)     "knocks" pt out for about 3 days  . Sulfamethoxazole W-Trimethoprim     REACTION: increased heart rate, nausea    Review of Systems  Review of Systems  Constitutional: Positive for malaise/fatigue. Negative for fever.  HENT: Negative for congestion.   Eyes: Negative for discharge.  Respiratory: Negative for shortness of breath.   Cardiovascular: Negative for chest pain, palpitations and leg swelling.  Gastrointestinal: Positive for heartburn. Negative for nausea, abdominal pain and diarrhea.  Genitourinary: Negative for dysuria.  Musculoskeletal: Positive for joint pain. Negative for falls.       Freib[place]'s disease  Skin: Negative for rash.  Neurological: Negative for loss of consciousness and headaches.  Endo/Heme/Allergies: Negative for polydipsia.  Psychiatric/Behavioral: Negative for depression and suicidal ideas. The patient is not nervous/anxious and does not have insomnia.     Objective  BP 128/85  Pulse 72  Temp(Src) 98.2 F (36.8 C) (Temporal)  Ht 5' 7.5" (1.715 m)  Wt 207 lb 6.4 oz (94.076 kg)  BMI 31.99 kg/m2  SpO2 97%  Physical  Exam  Physical Exam  Constitutional: She is oriented to person, place, and time and well-developed, well-nourished, and in no distress. No distress.  HENT:  Head: Normocephalic  and atraumatic.  Eyes: Conjunctivae are normal.  Neck: Neck supple. No thyromegaly present.  Cardiovascular: Normal rate, regular rhythm and normal heart sounds.   No murmur heard. Pulmonary/Chest: Effort normal and breath sounds normal. She has no wheezes.  Abdominal: She exhibits no distension and no mass.  Musculoskeletal: She exhibits no edema.  Lymphadenopathy:    She has no cervical adenopathy.  Neurological: She is alert and oriented to person, place, and time.  Skin: Skin is warm and dry. No rash noted. She is not diaphoretic.  Psychiatric: Memory, affect and judgment normal.    Lab Results  Component Value Date   TSH 1.06 01/04/2012   Lab Results  Component Value Date   WBC 4.5 01/04/2012   HGB 11.6* 01/04/2012   HCT 34.5* 01/04/2012   MCV 88.0 01/04/2012   PLT 198.0 01/04/2012   Lab Results  Component Value Date   CREATININE 0.9 01/04/2012   BUN 9 01/04/2012   NA 140 01/04/2012   K 3.9 01/04/2012   CL 105 01/04/2012   CO2 29 01/04/2012   Lab Results  Component Value Date   ALT 18 01/04/2012   AST 17 01/04/2012   ALKPHOS 82 01/04/2012   BILITOT 0.4 01/04/2012   Lab Results  Component Value Date   CHOL 224* 01/04/2012   Lab Results  Component Value Date   HDL 36.80* 01/04/2012   No results found for this basename: [facility]   Lab Results  Component Value Date   TRIG 226.0* 01/04/2012   Lab Results  Component Value Date   CHOLHDL 6 01/04/2012     Assessment & Plan  HYPERTENSION Well controlled on current meds no changes  GERD Still having symptoms on Omeprazole at 20 with Ranitidine. Will increase the Omeprazole to 40 mg daily and avoid offending foods.  HYPOTHYROIDISM Levothyroxine at adequate dose, no changes  HYPERLIPIDEMIA Avoid trans fats, minimize  simple carbs and saturated fats, increase exercise, add krilloil caps.   Anemia Mild, stable encouraged incresed iron in diet.  Anxiety and depression Sleeping more and struggling with low mood, increase Lexapro to 20 mg daily and given refill on Alprazolam to use prn.  ADD (attention deficit disorder) Given a trial of Rilatin 10 mg po bid prn and reassess next month. Warned about possible side effects, will stop med and call if any concerns

## 2012-04-13 NOTE — Assessment & Plan Note (Signed)
Avoid trans fats, minimize simple carbs and saturated fats, increase exercise, add krilloil caps.

## 2012-04-13 NOTE — Assessment & Plan Note (Signed)
Mild, stable encouraged incresed iron in diet.

## 2012-04-13 NOTE — Assessment & Plan Note (Signed)
Well controlled on current meds no changes 

## 2012-04-13 NOTE — Assessment & Plan Note (Signed)
Levothyroxine at adequate dose, no changes

## 2012-04-13 NOTE — Assessment & Plan Note (Signed)
Sleeping more and struggling with low mood, increase Lexapro to 20 mg daily and given refill on Alprazolam to use prn.

## 2012-04-13 NOTE — Assessment & Plan Note (Signed)
Given a trial of Rilatin 10 mg po bid prn and reassess next month. Warned about possible side effects, will stop med and call if any concerns

## 2012-04-18 ENCOUNTER — Telehealth: Payer: Self-pay

## 2012-04-18 NOTE — Telephone Encounter (Signed)
Pt left a message stating that she had some questions about her Ritalin RX.   Left a message for pt to return my call

## 2012-04-22 NOTE — Telephone Encounter (Signed)
Stop med, could try a one week trial of Adderall which is slightly different if she wants or she can come back in and we can discuss options again in next couple of weeks

## 2012-04-22 NOTE — Telephone Encounter (Signed)
Pt started Ritalin 10 mg bid X 2 weeks ago and pt can't tell a difference and its making her more anxious and moody. Pt stated she will snap at someone and isn't usually like that? Pt would like to know if she should continue this medication, take more, or stop? Please advise?

## 2012-04-24 ENCOUNTER — Ambulatory Visit: Payer: Managed Care, Other (non HMO) | Admitting: Family Medicine

## 2012-04-24 ENCOUNTER — Ambulatory Visit (INDEPENDENT_AMBULATORY_CARE_PROVIDER_SITE_OTHER): Payer: Managed Care, Other (non HMO) | Admitting: Family Medicine

## 2012-04-24 ENCOUNTER — Encounter: Payer: Self-pay | Admitting: Family Medicine

## 2012-04-24 ENCOUNTER — Ambulatory Visit (INDEPENDENT_AMBULATORY_CARE_PROVIDER_SITE_OTHER): Payer: Managed Care, Other (non HMO)

## 2012-04-24 VITALS — BP 136/92 | HR 76 | Ht 67.5 in | Wt 207.0 lb

## 2012-04-24 DIAGNOSIS — M25532 Pain in left wrist: Secondary | ICD-10-CM

## 2012-04-24 DIAGNOSIS — M79632 Pain in left forearm: Secondary | ICD-10-CM

## 2012-04-24 DIAGNOSIS — M25529 Pain in unspecified elbow: Secondary | ICD-10-CM

## 2012-04-24 DIAGNOSIS — M79609 Pain in unspecified limb: Secondary | ICD-10-CM

## 2012-04-24 DIAGNOSIS — W19XXXA Unspecified fall, initial encounter: Secondary | ICD-10-CM

## 2012-04-24 DIAGNOSIS — M25522 Pain in left elbow: Secondary | ICD-10-CM

## 2012-04-24 DIAGNOSIS — M25539 Pain in unspecified wrist: Secondary | ICD-10-CM

## 2012-04-24 DIAGNOSIS — M79642 Pain in left hand: Secondary | ICD-10-CM

## 2012-04-24 NOTE — Progress Notes (Signed)
OFFICE NOTE  04/24/2012  CC:  Chief Complaint  Patient presents with  . Fall    this AM, left elbow/wrist pain     HPI: Patient is a 52 y.o. Caucasian female who is here for pain s/p fall today. Pt was feeling fine today and just accidentally tripped walking on a flat surface but she didn't catch her fall very well. Sustained a bruise above right eyebrow, bruise and abrasion on right knee, and left arm pain from elbow all the way down to her hand/fingers.   Knee and eyebrow/head not bothering her, but left arm pain is intense. Moving or touching the arm makes it hurt worse.  Resting it alleviates some of the pain.  NO meds have been tried for pain yet.  No tingling or numbness in arm/hand. She states she has a remote history of a bad right wrist fracture as a child.  Pertinent PMH:  Past Medical History  Diagnosis Date  . Allergic rhinitis   . Adenomatous colon polyp   . Diverticulosis   . Colitis, Clostridium difficile 5/11,6/11  . Hypothyroidism   . Urinary incontinence   . PUD (peptic ulcer disease)   . Tobacco abuse   . Hypertension   . Hyperlipidemia   . Colon polyp   . Anemia 10/10/2011  . Preventative health care 10/10/2011  . Sinusitis acute 10/10/2011  . Chicken pox as a child    X 2  . Mumps as a child  . Lower back pain   . 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 Staph aureus infection 12/18/2011    Recurrent lesions in nares  . Perimenopausal 01/16/2012  . Anxiety and depression 01/17/2007    Qualifier: Diagnosis of  By: Everardo All MD, Cleophas Dunker   . 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 ALPRAZolam (XANAX) 0.25 MG tablet Take 1 tablet (0.25 mg total) by mouth at bedtime as needed for sleep or anxiety.  30 tablet  1  . Biotin 1000 MCG tablet Take 2,000 mcg by mouth daily.      . calcium carbonate (TUMS E-X 750) 750 MG chewable tablet Chew 2 tablets by mouth daily.        . Coenzyme Q10 (CO Q  10 PO) Take 200 mg by mouth daily.      Marland Kitchen escitalopram (LEXAPRO) 20 MG tablet Take 1 tablet (20 mg total) by mouth daily.  30 tablet  3  . fexofenadine (ALLEGRA) 180 MG tablet Take 180 mg by mouth daily.      . fish oil-omega-3 fatty acids 1000 MG capsule Take 1 g by mouth daily.      Marland Kitchen HYDROcodone-acetaminophen (NORCO) 10-325 MG per tablet Take 1 tablet by mouth every 6 (six) hours as needed for pain.  60 tablet  1  . levothyroxine (SYNTHROID, LEVOTHROID) 88 MCG tablet Take 1 tablet (88 mcg total) by mouth daily.  30 tablet  5  . Loperamide HCl (IMODIUM A-D PO) Take by mouth.      . methylphenidate (RITALIN) 10 MG tablet Take 1 tablet (10 mg total) by mouth 2 (two) times daily.  60 tablet  0  . metoprolol tartrate (LOPRESSOR) 25 MG tablet Take 0.5 tablets (12.5 mg total) by mouth daily. 1/2 tablet by mouth once daily  45 tablet  3  . Multiple Vitamins-Minerals (ONE-A-DAY MENOPAUSE FORMULA) TABS Take 1 tablet by mouth daily. 3 weekly      . mupirocin  ointment (BACTROBAN) 2 % Apply topically 2 (two) times daily. Apply to nares with clean qtip for 7-10 days  22 g  0  . nitroGLYCERIN (NITROSTAT) 0.4 MG SL tablet Place 0.4 mg under the tongue every 5 (five) minutes as needed.        Marland Kitchen omeprazole (PRILOSEC) 40 MG capsule Take 1 capsule (40 mg total) by mouth daily.  90 capsule  3  . ondansetron (ZOFRAN) 4 MG tablet Take 1 tablet (4 mg total) by mouth every 6 (six) hours as needed.  20 tablet  5  . Probiotic Product (PROBIOTIC FORMULA) CAPS Take 1 capsule by mouth daily.        . ranitidine (ZANTAC) 150 MG tablet Take 1 tablet (150 mg total) by mouth 2 (two) times daily.  180 tablet  3   No facility-administered medications prior to visit.    PE: Blood pressure 136/92, pulse 76, height 5' 7.5" (1.715 m), weight 207 lb (93.895 kg). Gen: Alert, well appearing.  Patient is oriented to person, place, time, and situation. Slight ecchymoses noted above right eyebrow, without swelling. Right knee with  abrasion and focal bruise (early hematoma) at anterolateral region.  Flexion/extension of knee is fine.  Knee is nontender. Left arm is tender to touch from elbow (diffusely) all the way down her forearm, into wrist (most intense) and metacarpal region.  No swelling of elbow or forearm but her dorsal wrist/hand surface has a touch of swelling.  She cannot do wrist ROM due to pain.  Finger ROM intact.  Radial pulse 2+.   Supination and pronation at the elbow joint is painful.  Flexion/extension of elbow is fine.  IMPRESSION AND PLAN:  1) Injuries s/p fall: forehead contusion, right knee contusion and abrasion--monitor, no further eval needed. Left arm needs x-ray from elbow down into hand.  Nurse fitted her with left arm sling today in office.  A left wrist splint was too uncomfortable for her to wear. X-rays ordered. Pt has orthopedist already: Dr. Charlett Blake, if needed. Pt has hydrocodone/APAP pain med at home for her back already so she does not need any rx for pain med.  FOLLOW UP: to be determined based on x-ray results.

## 2012-04-25 MED ORDER — AMPHETAMINE-DEXTROAMPHETAMINE 10 MG PO TABS: 10.0000 mg | ORAL_TABLET | Freq: Every day | ORAL | Status: DC

## 2012-04-25 NOTE — Telephone Encounter (Signed)
Patient said that she is willing to try the one week trial Adderall.

## 2012-04-25 NOTE — Telephone Encounter (Signed)
Pt informed, RX at front desk, pt also states she has an appt on April 25

## 2012-04-25 NOTE — Telephone Encounter (Signed)
OK she can have Adderall 10 mg tab 1 tab po bid # 30 and come in towards the end to let us check bp if she tolerates or to switch if she does not

## 2012-05-09 ENCOUNTER — Encounter: Payer: Self-pay | Admitting: Family Medicine

## 2012-05-09 ENCOUNTER — Ambulatory Visit (INDEPENDENT_AMBULATORY_CARE_PROVIDER_SITE_OTHER): Payer: Managed Care, Other (non HMO) | Admitting: Family Medicine

## 2012-05-09 VITALS — BP 118/76 | HR 62 | Temp 98.0°F | Ht 67.0 in | Wt 205.8 lb

## 2012-05-09 DIAGNOSIS — I10 Essential (primary) hypertension: Secondary | ICD-10-CM

## 2012-05-09 DIAGNOSIS — M79602 Pain in left arm: Secondary | ICD-10-CM

## 2012-05-09 DIAGNOSIS — M25561 Pain in right knee: Secondary | ICD-10-CM

## 2012-05-09 DIAGNOSIS — F988 Other specified behavioral and emotional disorders with onset usually occurring in childhood and adolescence: Secondary | ICD-10-CM

## 2012-05-09 DIAGNOSIS — M79609 Pain in unspecified limb: Secondary | ICD-10-CM

## 2012-05-09 DIAGNOSIS — M25569 Pain in unspecified knee: Secondary | ICD-10-CM

## 2012-05-09 NOTE — Patient Instructions (Addendum)
Attention Deficit Hyperactivity Disorder Attention deficit hyperactivity disorder (ADHD) is a problem with behavior issues based on the way the brain functions (neurobehavioral disorder). It is a common reason for behavior and academic problems in school. CAUSES  The cause of ADHD is unknown in most cases. It may run in families. It sometimes can be associated with learning disabilities and other behavioral problems. SYMPTOMS  There are 3 types of ADHD. The 3 types and some of the symptoms include:  Inattentive  Gets bored or distracted easily.  Loses or forgets things. Forgets to hand in homework.  Has trouble organizing or completing tasks.  Difficulty staying on task.  An inability to organize daily tasks and school work.  Leaving projects, chores, or homework unfinished.  Trouble paying attention or responding to details. Careless mistakes.  Difficulty following directions. Often seems like is not listening.  Dislikes activities that require sustained attention (like chores or homework).  Hyperactive-impulsive  Feels like it is impossible to sit still or stay in a seat. Fidgeting with hands and feet.  Trouble waiting turn.  Talking too much or out of turn. Interruptive.  Speaks or acts impulsively.  Aggressive, disruptive behavior.  Constantly busy or on the go, noisy.  Combined  Has symptoms of both of the above. Often children with ADHD feel discouraged about themselves and with school. They often perform well below their abilities in school. These symptoms can cause problems in home, school, and in relationships with peers. As children get older, the excess motor activities can calm down, but the problems with paying attention and staying organized persist. Most children do not outgrow ADHD but with good treatment can learn to cope with the symptoms. DIAGNOSIS  When ADHD is suspected, the diagnosis should be made by professionals trained in ADHD.  Diagnosis will  include:  Ruling out other reasons for the child's behavior.  The caregivers will check with the child's school and check their medical records.  They will talk to teachers and parents.  Behavior rating scales for the child will be filled out by those dealing with the child on a daily basis. A diagnosis is made only after all information has been considered. TREATMENT  Treatment usually includes behavioral treatment often along with medicines. It may include stimulant medicines. The stimulant medicines decrease impulsivity and hyperactivity and increase attention. Other medicines used include antidepressants and certain blood pressure medicines. Most experts agree that treatment for ADHD should address all aspects of the child's functioning. Treatment should not be limited to the use of medicines alone. Treatment should include structured classroom management. The parents must receive education to address rewarding good behavior, discipline, and limit-setting. Tutoring or behavioral therapy or both should be available for the child. If untreated, the disorder can have long-term serious effects into adolescence and adulthood. HOME CARE INSTRUCTIONS   Often with ADHD there is a lot of frustration among the family in dealing with the illness. There is often blame and anger that is not warranted. This is a life long illness. There is no way to prevent ADHD. In many cases, because the problem affects the family as a whole, the entire family may need help. A therapist can help the family find better ways to handle the disruptive behaviors and promote change. If the child is young, most of the therapist's work is with the parents. Parents will learn techniques for coping with and improving their child's behavior. Sometimes only the child with the ADHD needs counseling. Your caregivers can help   you make these decisions.  Children with ADHD may need help in organizing. Some helpful tips include:  Keep  routines the same every day from wake-up time to bedtime. Schedule everything. This includes homework and playtime. This should include outdoor and indoor recreation. Keep the schedule on the refrigerator or a bulletin board where it is frequently seen. Mark schedule changes as far in advance as possible.  Have a place for everything and keep everything in its place. This includes clothing, backpacks, and school supplies.  Encourage writing down assignments and bringing home needed books.  Offer your child a well-balanced diet. Breakfast is especially important for school performance. Children should avoid drinks with caffeine including:  Soft drinks.  Coffee.  Tea.  However, some older children (adolescents) may find these drinks helpful in improving their attention.  Children with ADHD need consistent rules that they can understand and follow. If rules are followed, give small rewards. Children with ADHD often receive, and expect, criticism. Look for good behavior and praise it. Set realistic goals. Give clear instructions. Look for activities that can foster success and self-esteem. Make time for pleasant activities with your child. Give lots of affection.  Parents are their children's greatest advocates. Learn as much as possible about ADHD. This helps you become a stronger and better advocate for your child. It also helps you educate your child's teachers and instructors if they feel inadequate in these areas. Parent support groups are often helpful. A national group with local chapters is called CHADD (Children and Adults with Attention Deficit Hyperactivity Disorder). PROGNOSIS  There is no cure for ADHD. Children with the disorder seldom outgrow it. Many find adaptive ways to accommodate the ADHD as they mature. SEEK MEDICAL CARE IF:  Your child has repeated muscle twitches, cough or speech outbursts.  Your child has sleep problems.  Your child has a marked loss of  appetite.  Your child develops depression.  Your child has new or worsening behavioral problems.  Your child develops dizziness.  Your child has a racing heart.  Your child has stomach pains.  Your child develops headaches. Document Released: 12/22/2001 Document Revised: 03/26/2011 Document Reviewed: 08/04/2007 Exit[employer] Patient Information 2013 [employer].  

## 2012-05-10 ENCOUNTER — Encounter: Payer: Self-pay | Admitting: Family Medicine

## 2012-05-10 DIAGNOSIS — M25561 Pain in right knee: Secondary | ICD-10-CM | POA: Insufficient documentation

## 2012-05-10 DIAGNOSIS — M79602 Pain in left arm: Secondary | ICD-10-CM | POA: Insufficient documentation

## 2012-05-10 HISTORY — DX: Pain in right knee: M25.561

## 2012-05-10 NOTE — Progress Notes (Signed)
Patient ID: Joan Robinson, female   DOB: 02-08-1960, 52 y.o.   MRN: [id number] Joan Robinson [id number] [birth year] 05/10/2012      Progress Note-Follow Up  Subjective  Chief Complaint  Chief Complaint  Patient presents with  . Follow-up    HPI  Patient is a 52 year old Caucasian female who is in today for followup. She had a fall recently and landed on her left arm and right knee has some persistent pain and swelling but it is improving. No erythema or warmth no bony tenderness. Her knee is largely inflamed. He at this point her arm hurts mostly at the wrist. She was having trouble at the elbow but that is improved. She did not tolerate Ritalin as it caused irritability she continues with fatigue and lack of focus. No other acute complaints. Chest palpitations, shortness of breath, GI or GU concerns noted.  Past Medical History  Diagnosis Date  . Allergic rhinitis   . Adenomatous colon polyp   . Diverticulosis   . Colitis, Clostridium difficile 5/11,6/11  . Hypothyroidism   . Urinary incontinence   . PUD (peptic ulcer disease)   . Tobacco abuse   . Hypertension   . Hyperlipidemia   . Colon polyp   . Anemia 10/10/2011  . Preventative health care 10/10/2011  . Sinusitis acute 10/10/2011  . Chicken pox as a child    X 2  . Mumps as a child  . Lower back pain   . 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 Staph aureus infection 12/18/2011    Recurrent lesions in nares  . Perimenopausal 01/16/2012  . Anxiety and depression 01/17/2007    Qualifier: Diagnosis of  By: Everardo All MD, Cleophas Dunker   . 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 Right knee pain 05/10/2012    Past Surgical History  Procedure Laterality Date  . Tubal ligation  1995  . Uterine suspension      mesh  . Colonoscopy    . Gated spect wall motion stress cardiolite  11/05/2001  . Polypectomy    . Abdominal hysterectomy    . Abdominal hysterectomy  2011    complete  . Dilation and curettage of uterus  1985    Family History   Problem Relation Age of Onset  . Heart disease Mother     stents  . Stroke Mother   . Hyperlipidemia Mother   . Hypertension Mother   . Other Mother     blood disorder- mgus  . Colon polyps Father   . Other Father     aorta disection  . Aneurysm Father   . Colon cancer Paternal Uncle   . Irritable bowel syndrome Daughter   . Other Daughter     gastritis  . Mental illness Daughter     bipolar and mood disorder  . Hypertension Sister     ?  Marland Kitchen Mental illness Son     bipolar  . Arthritis Sister   . Other Sister     thyroid  . Other Sister     thyroid    History   Social History  . Marital Status: Married    Spouse Name: N/A    Number of Children: 2  . Years of Education: N/A   Occupational History  . 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 Smoking status: Former Smoker -- 1.00 packs/day for 30 years    Types: Cigarettes    Quit date: 05/15/2009  . Smokeless tobacco: Never Used  .  Alcohol Use: No  . Drug Use: No  . Sexually Active: No   Other Topics Concern  . Not on file   Social History Narrative  . No narrative on file    Current Outpatient Prescriptions on File Prior to Visit  Medication Sig Dispense Refill  . ALPRAZolam (XANAX) 0.25 MG tablet Take 1 tablet (0.25 mg total) by mouth at bedtime as needed for sleep or anxiety.  30 tablet  1  . amphetamine-dextroamphetamine (ADDERALL) 10 MG tablet Take 1 tablet (10 mg total) by mouth daily.  30 tablet  0  . Biotin 1000 MCG tablet Take 2,000 mcg by mouth daily.      . calcium carbonate (TUMS E-X 750) 750 MG chewable tablet Chew 2 tablets by mouth daily.        . Coenzyme Q10 (CO Q 10 PO) Take 200 mg by mouth daily.      Marland Kitchen escitalopram (LEXAPRO) 20 MG tablet Take 1 tablet (20 mg total) by mouth daily.  30 tablet  3  . fexofenadine (ALLEGRA) 180 MG tablet Take 180 mg by mouth daily.      . fish oil-omega-3 fatty acids 1000 MG capsule Take 1 g by mouth daily.      Marland Kitchen HYDROcodone-acetaminophen  (NORCO) 10-325 MG per tablet Take 1 tablet by mouth every 6 (six) hours as needed for pain.  60 tablet  1  . levothyroxine (SYNTHROID, LEVOTHROID) 88 MCG tablet Take 1 tablet (88 mcg total) by mouth daily.  30 tablet  5  . Loperamide HCl (IMODIUM A-D PO) Take by mouth.      . metoprolol tartrate (LOPRESSOR) 25 MG tablet Take 0.5 tablets (12.5 mg total) by mouth daily. 1/2 tablet by mouth once daily  45 tablet  3  . omeprazole (PRILOSEC) 40 MG capsule Take 1 capsule (40 mg total) by mouth daily.  90 capsule  3  . ondansetron (ZOFRAN) 4 MG tablet Take 1 tablet (4 mg total) by mouth every 6 (six) hours as needed.  20 tablet  5  . Probiotic Product (PROBIOTIC FORMULA) CAPS Take 1 capsule by mouth daily.        . ranitidine (ZANTAC) 150 MG tablet Take 1 tablet (150 mg total) by mouth 2 (two) times daily.  180 tablet  3  . Multiple Vitamins-Minerals (ONE-A-DAY MENOPAUSE FORMULA) TABS Take 1 tablet by mouth daily. 3 weekly      . mupirocin ointment (BACTROBAN) 2 % Apply topically 2 (two) times daily. Apply to nares with clean qtip for 7-10 days  22 g  0  . nitroGLYCERIN (NITROSTAT) 0.4 MG SL tablet Place 0.4 mg under the tongue every 5 (five) minutes as needed.         No current facility-administered medications on file prior to visit.    Allergies  Allergen Reactions  . Keflex (Cephalexin) Diarrhea  . Aspirin     REACTION: difficulty breathing  . Dicyclomine Hcl     REACTION: mouth ulcers  . Metronidazole     REACTION: hives, mouth ulcers  . Moxifloxacin     REACTION: increased heart rate, nausea  . Nsaids     REACTION: difficulty breathing  . Phenergan (Promethazine Hcl)     "knocks" pt out for about 3 days  . Sulfamethoxazole W-Trimethoprim     REACTION: increased heart rate, nausea    Review of Systems  Review of Systems  Constitutional: Negative for fever and malaise/fatigue.  HENT: Negative for congestion.   Eyes: Negative for  discharge.  Respiratory: Negative for shortness of  breath.   Cardiovascular: Negative for chest pain, palpitations and leg swelling.  Gastrointestinal: Negative for nausea, abdominal pain and diarrhea.  Genitourinary: Negative for dysuria.  Musculoskeletal: Positive for joint pain. Negative for falls.  Skin: Negative for rash.  Neurological: Negative for loss of consciousness and headaches.  Endo/Heme/Allergies: Negative for polydipsia.  Psychiatric/Behavioral: Positive for depression. Negative for suicidal ideas. The patient is nervous/anxious. The patient does not have insomnia.     Objective  BP 118/76  Pulse 62  Temp(Src) 98 F (36.7 C) (Oral)  Ht 5\' 7"  (1.702 m)  Wt 205 lb 12 oz (93.328 kg)  BMI 32.22 kg/m2  SpO2 95%  Physical Exam  Physical Exam  Constitutional: She is oriented to person, place, and time and well-developed, well-nourished, and in no distress. No distress.  HENT:  Head: Normocephalic and atraumatic.  Eyes: Conjunctivae are normal.  Neck: Neck supple. No thyromegaly present.  Cardiovascular: Normal rate, regular rhythm and normal heart sounds.   No murmur heard. Pulmonary/Chest: Effort normal and breath sounds normal. She has no wheezes.  Abdominal: She exhibits no distension and no mass.  Musculoskeletal: She exhibits no edema.  Lymphadenopathy:    She has no cervical adenopathy.  Neurological: She is alert and oriented to person, place, and time.  Skin: Skin is warm and dry. No rash noted. She is not diaphoretic.  Psychiatric: Memory, affect and judgment normal.    Lab Results  Component Value Date   TSH 1.06 01/04/2012   Lab Results  Component Value Date   WBC 4.5 01/04/2012   HGB 11.6* 01/04/2012   HCT 34.5* 01/04/2012   MCV 88.0 01/04/2012   PLT 198.0 01/04/2012   Lab Results  Component Value Date   CREATININE 0.9 01/04/2012   BUN 9 01/04/2012   NA 140 01/04/2012   K 3.9 01/04/2012   CL 105 01/04/2012   CO2 29 01/04/2012   Lab Results  Component Value Date   ALT 18 01/04/2012    AST 17 01/04/2012   ALKPHOS 82 01/04/2012   BILITOT 0.4 01/04/2012   Lab Results  Component Value Date   CHOL 224* 01/04/2012   Lab Results  Component Value Date   HDL 36.80* 01/04/2012   No results found for this basename: [facility]   Lab Results  Component Value Date   TRIG 226.0* 01/04/2012   Lab Results  Component Value Date   CHOLHDL 6 01/04/2012     Assessment & Plan  HYPERTENSION Adequately controlled today no changes  Left arm pain From recent fall, improving but slowly encouraged ice and aspercreme prn and report if no continued improvement  ADD (attention deficit disorder) Did not tolerate Ritalin, will try Adderall  Right knee pain S/p fall will try ice and aspercreme and if no improvement may need referral to ortho

## 2012-05-10 NOTE — Assessment & Plan Note (Signed)
Adequately controlled today no changes. 

## 2012-05-10 NOTE — Assessment & Plan Note (Signed)
From recent fall, improving but slowly encouraged ice and aspercreme prn and report if no continued improvement

## 2012-05-10 NOTE — Assessment & Plan Note (Signed)
Did not tolerate Ritalin, will try Adderall

## 2012-05-10 NOTE — Assessment & Plan Note (Signed)
S/p fall will try ice and aspercreme and if no improvement may need referral to ortho

## 2012-05-22 ENCOUNTER — Telehealth: Payer: Self-pay

## 2012-05-22 NOTE — Telephone Encounter (Signed)
Pt states she is at 1.5 tablets (15 mg) not having any issues. Pt is going to go to 2 tabs on Saturday.   Verbal per md this is ok. Pt informed

## 2012-05-29 ENCOUNTER — Telehealth: Payer: Self-pay | Admitting: Family Medicine

## 2012-05-29 ENCOUNTER — Ambulatory Visit (INDEPENDENT_AMBULATORY_CARE_PROVIDER_SITE_OTHER): Payer: Managed Care, Other (non HMO) | Admitting: Family Medicine

## 2012-05-29 ENCOUNTER — Encounter: Payer: Self-pay | Admitting: Family Medicine

## 2012-05-29 VITALS — BP 106/82 | HR 70 | Temp 98.3°F | Ht 67.0 in | Wt 205.8 lb

## 2012-05-29 DIAGNOSIS — K219 Gastro-esophageal reflux disease without esophagitis: Secondary | ICD-10-CM

## 2012-05-29 DIAGNOSIS — D649 Anemia, unspecified: Secondary | ICD-10-CM

## 2012-05-29 DIAGNOSIS — I10 Essential (primary) hypertension: Secondary | ICD-10-CM

## 2012-05-29 DIAGNOSIS — E039 Hypothyroidism, unspecified: Secondary | ICD-10-CM

## 2012-05-29 DIAGNOSIS — E785 Hyperlipidemia, unspecified: Secondary | ICD-10-CM

## 2012-05-29 DIAGNOSIS — F988 Other specified behavioral and emotional disorders with onset usually occurring in childhood and adolescence: Secondary | ICD-10-CM

## 2012-05-29 DIAGNOSIS — C4491 Basal cell carcinoma of skin, unspecified: Secondary | ICD-10-CM

## 2012-05-29 MED ORDER — AMPHETAMINE-DEXTROAMPHETAMINE 20 MG PO TABS: 20.0000 mg | ORAL_TABLET | Freq: Two times a day (BID) | ORAL | Status: DC

## 2012-05-29 NOTE — Telephone Encounter (Signed)
Fasting labs prior to next visit, lipid, renal cbc, tsh, hepatic  Patient has upcoming appt mid-June. She will be going to [facility] lab

## 2012-05-29 NOTE — Progress Notes (Signed)
Patient ID: Joan Robinson, female   DOB: 1960-11-08, 52 y.o.   MRN: [id number] Joan Robinson [id number] 1960/10/21 05/29/2012      Progress Note-Follow Up  Subjective  Chief Complaint  Chief Complaint  Patient presents with  . Follow-up    3 week    HPI  Female who is in today for followup. Overall doing well. Good response to the Adderall has helped her energy and her focus. No recent illness. No chest pain no palpitations, shortness of breath, GI or GU concerns. Is using melatonin for sleep some with decent results.  Past Medical History  Diagnosis Date  . Allergic rhinitis   . Adenomatous colon polyp   . Diverticulosis   . Colitis, Clostridium difficile 5/11,6/11  . Hypothyroidism   . Urinary incontinence   . PUD (peptic ulcer disease)   . Tobacco abuse   . Hypertension   . Hyperlipidemia   . Colon polyp   . Anemia 10/10/2011  . Preventative health care 10/10/2011  . Sinusitis acute 10/10/2011  . Chicken pox as a child    X 2  . Mumps as a child  . Lower back pain   . 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 Staph aureus infection 12/18/2011    Recurrent lesions in nares  . Perimenopausal 01/16/2012  . Anxiety and depression 01/17/2007    Qualifier: Diagnosis of  By: Everardo All MD, Cleophas Dunker   . 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 Right knee pain 05/10/2012    Past Surgical History  Procedure Laterality Date  . Tubal ligation  1995  . Uterine suspension      mesh  . Colonoscopy    . [place] spect wall motion stress cardiolite  11/05/2001  . Polypectomy    . Abdominal hysterectomy    . Abdominal hysterectomy  2011    complete  . Dilation and curettage of uterus  1985    Family History  Problem Relation Age of Onset  . Heart disease Mother     stents  . Stroke Mother   . Hyperlipidemia Mother   . Hypertension Mother   . Other Mother     blood disorder- mgus  . Colon polyps Father   . Other Father     aorta disection  . Aneurysm Father   . Colon cancer Paternal  Uncle   . Irritable bowel syndrome Daughter   . Other Daughter     gastritis  . Mental illness Daughter     bipolar and mood disorder  . Hypertension Sister     ?  Marland Kitchen Mental illness Son     bipolar  . Arthritis Sister   . Other Sister     thyroid  . Other Sister     thyroid    History   Social History  . Marital Status: Married    Spouse Name: N/A    Number of Children: 2  . Years of Education: N/A   Occupational History  . 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 Smoking status: Former Smoker -- 1.00 packs/day for 30 years    Types: Cigarettes    Quit date: 05/15/2009  . Smokeless tobacco: Never Used  . Alcohol Use: No  . Drug Use: No  . Sexually Active: No   Other Topics Concern  . Not on file   Social History Narrative  . No narrative on file    Current Outpatient Prescriptions on File Prior to Visit  Medication Sig Dispense  Refill  . ALPRAZolam (XANAX) 0.25 MG tablet Take 1 tablet (0.25 mg total) by mouth at bedtime as needed for sleep or anxiety.  30 tablet  1  . Biotin 1000 MCG tablet Take 2,000 mcg by mouth daily.      . calcium carbonate (TUMS E-X 750) 750 MG chewable tablet Chew 2 tablets by mouth daily.        . Coenzyme Q10 (CO Q 10 PO) Take 200 mg by mouth daily.      Marland Kitchen escitalopram (LEXAPRO) 20 MG tablet Take 1 tablet (20 mg total) by mouth daily.  30 tablet  3  . fexofenadine (ALLEGRA) 180 MG tablet Take 180 mg by mouth daily.      . fish oil-omega-3 fatty acids 1000 MG capsule Take 1 g by mouth daily.      Marland Kitchen HYDROcodone-acetaminophen (NORCO) 10-325 MG per tablet Take 1 tablet by mouth every 6 (six) hours as needed for pain.  60 tablet  1  . levothyroxine (SYNTHROID, LEVOTHROID) 88 MCG tablet Take 1 tablet (88 mcg total) by mouth daily.  30 tablet  5  . Loperamide HCl (IMODIUM A-D PO) Take by mouth.      . metoprolol tartrate (LOPRESSOR) 25 MG tablet Take 0.5 tablets (12.5 mg total) by mouth daily. 1/2 tablet by mouth once daily  45  tablet  3  . nitroGLYCERIN (NITROSTAT) 0.4 MG SL tablet Place 0.4 mg under the tongue every 5 (five) minutes as needed.        Marland Kitchen omeprazole (PRILOSEC) 40 MG capsule Take 1 capsule (40 mg total) by mouth daily.  90 capsule  3  . ondansetron (ZOFRAN) 4 MG tablet Take 1 tablet (4 mg total) by mouth every 6 (six) hours as needed.  20 tablet  5  . Probiotic Product (PROBIOTIC FORMULA) CAPS Take 1 capsule by mouth daily.        . ranitidine (ZANTAC) 150 MG tablet Take 1 tablet (150 mg total) by mouth 2 (two) times daily.  180 tablet  3   No current facility-administered medications on file prior to visit.    Allergies  Allergen Reactions  . Keflex (Cephalexin) Diarrhea  . Aspirin     REACTION: difficulty breathing  . Dicyclomine Hcl     REACTION: mouth ulcers  . Metronidazole     REACTION: hives, mouth ulcers  . Moxifloxacin     REACTION: increased heart rate, nausea  . Nsaids     REACTION: difficulty breathing  . Phenergan (Promethazine Hcl)     "knocks" pt out for about 3 days  . Sulfamethoxazole W-Trimethoprim     REACTION: increased heart rate, nausea    Review of Systems  Review of Systems  Constitutional: Negative for fever and malaise/fatigue.  HENT: Negative for congestion.   Eyes: Negative for discharge.  Respiratory: Negative for shortness of breath.   Cardiovascular: Negative for chest pain, palpitations and leg swelling.  Gastrointestinal: Negative for nausea, abdominal pain and diarrhea.  Genitourinary: Negative for dysuria.  Musculoskeletal: Negative for falls.  Skin: Negative for rash.  Neurological: Negative for loss of consciousness and headaches.  Endo/Heme/Allergies: Negative for polydipsia.  Psychiatric/Behavioral: Negative for depression and suicidal ideas. The patient is not nervous/anxious and does not have insomnia.     Objective  BP 106/82  Pulse 70  Temp(Src) 98.3 F (36.8 C) (Oral)  Ht 5\' 7"  (1.702 m)  Wt 205 lb 12.8 oz (93.35 kg)  BMI 32.23  kg/m2  SpO2 97%  Physical  Exam  Physical Exam  Constitutional: She is oriented to person, place, and time and well-developed, well-nourished, and in no distress. No distress.  HENT:  Head: Normocephalic and atraumatic.  Eyes: Conjunctivae are normal.  Neck: Neck supple. No thyromegaly present.  Cardiovascular: Normal rate, regular rhythm and normal heart sounds.   No murmur heard. Pulmonary/Chest: Effort normal and breath sounds normal. She has no wheezes.  Abdominal: She exhibits no distension and no mass.  Musculoskeletal: She exhibits no edema.  Lymphadenopathy:    She has no cervical adenopathy.  Neurological: She is alert and oriented to person, place, and time.  Skin: Skin is warm and dry. No rash noted. She is not diaphoretic.  Psychiatric: Memory, affect and judgment normal.    Lab Results  Component Value Date   TSH 1.06 01/04/2012   Lab Results  Component Value Date   WBC 4.5 01/04/2012   HGB 11.6* 01/04/2012   HCT 34.5* 01/04/2012   MCV 88.0 01/04/2012   PLT 198.0 01/04/2012   Lab Results  Component Value Date   CREATININE 0.9 01/04/2012   BUN 9 01/04/2012   NA 140 01/04/2012   K 3.9 01/04/2012   CL 105 01/04/2012   CO2 29 01/04/2012   Lab Results  Component Value Date   ALT 18 01/04/2012   AST 17 01/04/2012   ALKPHOS 82 01/04/2012   BILITOT 0.4 01/04/2012   Lab Results  Component Value Date   CHOL 224* 01/04/2012   Lab Results  Component Value Date   HDL 36.80* 01/04/2012   No results found for this basename: LDLCALC   Lab Results  Component Value Date   TRIG 226.0* 01/04/2012   Lab Results  Component Value Date   CHOLHDL 6 01/04/2012     Assessment & Plan  GERD Avoid offending foods, good control, no changes  HYPERTENSION Well controlled, no changes to meds  ADD (attention deficit disorder) Good response to Adderall no changes

## 2012-05-29 NOTE — Patient Instructions (Addendum)
Try Benadryl and or Melatonin for sleep Fasting labs prior to next visit, lipid, renal cbc, tsh, hepatic Attention Deficit Hyperactivity Disorder Attention deficit hyperactivity disorder (ADHD) is a problem with behavior issues based on the way the brain functions (neurobehavioral disorder). It is a common reason for behavior and academic problems in school. CAUSES  The cause of ADHD is unknown in most cases. It may run in families. It sometimes can be associated with learning disabilities and other behavioral problems. SYMPTOMS  There are 3 types of ADHD. The 3 types and some of the symptoms include:  Inattentive  Gets bored or distracted easily.  Loses or forgets things. Forgets to hand in homework.  Has trouble organizing or completing tasks.  Difficulty staying on task.  An inability to organize daily tasks and school work.  Leaving projects, chores, or homework unfinished.  Trouble paying attention or responding to details. Careless mistakes.  Difficulty following directions. Often seems like is not listening.  Dislikes activities that require sustained attention (like chores or homework).  Hyperactive-impulsive  Feels like it is impossible to sit still or stay in a seat. Fidgeting with hands and feet.  Trouble waiting turn.  Talking too much or out of turn. Interruptive.  Speaks or acts impulsively.  Aggressive, disruptive behavior.  Constantly busy or on the go, noisy.  Combined  Has symptoms of both of the above. Often children with ADHD feel discouraged about themselves and with school. They often perform well below their abilities in school. These symptoms can cause problems in home, school, and in relationships with peers. As children get older, the excess motor activities can calm down, but the problems with paying attention and staying organized persist. Most children do not outgrow ADHD but with good treatment can learn to cope with the  symptoms. DIAGNOSIS  When ADHD is suspected, the diagnosis should be made by professionals trained in ADHD.  Diagnosis will include:  Ruling out other reasons for the child's behavior.  The caregivers will check with the child's school and check their medical records.  They will talk to teachers and parents.  Behavior rating scales for the child will be filled out by those dealing with the child on a daily basis. A diagnosis is made only after all information has been considered. TREATMENT  Treatment usually includes behavioral treatment often along with medicines. It may include stimulant medicines. The stimulant medicines decrease impulsivity and hyperactivity and increase attention. Other medicines used include antidepressants and certain blood pressure medicines. Most experts agree that treatment for ADHD should address all aspects of the child's functioning. Treatment should not be limited to the use of medicines alone. Treatment should include structured classroom management. The parents must receive education to address rewarding good behavior, discipline, and limit-setting. Tutoring or behavioral therapy or both should be available for the child. If untreated, the disorder can have long-term serious effects into adolescence and adulthood. HOME CARE INSTRUCTIONS   Often with ADHD there is a lot of frustration among the family in dealing with the illness. There is often blame and anger that is not warranted. This is a life long illness. There is no way to prevent ADHD. In many cases, because the problem affects the family as a whole, the entire family may need help. A therapist can help the family find better ways to handle the disruptive behaviors and promote change. If the child is young, most of the therapist's work is with the parents. Parents will learn techniques for coping with  and improving their child's behavior. Sometimes only the child with the ADHD needs counseling. Your  caregivers can help you make these decisions.  Children with ADHD may need help in organizing. Some helpful tips include:  Keep routines the same every day from wake-up time to bedtime. Schedule everything. This includes homework and playtime. This should include outdoor and indoor recreation. Keep the schedule on the refrigerator or a bulletin board where it is frequently seen. Mark schedule changes as far in advance as possible.  Have a place for everything and keep everything in its place. This includes clothing, backpacks, and school supplies.  Encourage writing down assignments and bringing home needed books.  Offer your child a well-balanced diet. Breakfast is especially important for school performance. Children should avoid drinks with caffeine including:  Soft drinks.  Coffee.  Tea.  However, some older children (adolescents) may find these drinks helpful in improving their attention.  Children with ADHD need consistent rules that they can understand and follow. If rules are followed, give small rewards. Children with ADHD often receive, and expect, criticism. Look for good behavior and praise it. Set realistic goals. Give clear instructions. Look for activities that can foster success and self-esteem. Make time for pleasant activities with your child. Give lots of affection.  Parents are their children's greatest advocates. Learn as much as possible about ADHD. This helps you become a stronger and better advocate for your child. It also helps you educate your child's teachers and instructors if they feel inadequate in these areas. Parent support groups are often helpful. A national group with local chapters is called CHADD (Children and Adults with Attention Deficit Hyperactivity Disorder). PROGNOSIS  There is no cure for ADHD. Children with the disorder seldom outgrow it. Many find adaptive ways to accommodate the ADHD as they mature. SEEK MEDICAL CARE IF:  Your child has  repeated muscle twitches, cough or speech outbursts.  Your child has sleep problems.  Your child has a marked loss of appetite.  Your child develops depression.  Your child has new or worsening behavioral problems.  Your child develops dizziness.  Your child has a racing heart.  Your child has stomach pains.  Your child develops headaches. Document Released: 12/22/2001 Document Revised: 03/26/2011 Document Reviewed: 08/04/2007 [facility] Patient Information 2013 [place], [place].

## 2012-06-01 ENCOUNTER — Encounter: Payer: Self-pay | Admitting: Family Medicine

## 2012-06-01 DIAGNOSIS — C4491 Basal cell carcinoma of skin, unspecified: Secondary | ICD-10-CM | POA: Insufficient documentation

## 2012-06-01 HISTORY — DX: Basal cell carcinoma of skin, unspecified: C44.91

## 2012-06-01 NOTE — Assessment & Plan Note (Signed)
Avoid offending foods, good control, no changes

## 2012-06-01 NOTE — Assessment & Plan Note (Signed)
Good response to Adderall no changes

## 2012-06-01 NOTE — Assessment & Plan Note (Signed)
Well controlled, no changes to meds 

## 2012-06-03 ENCOUNTER — Telehealth: Payer: Self-pay | Admitting: Family Medicine

## 2012-06-03 DIAGNOSIS — I10 Essential (primary) hypertension: Secondary | ICD-10-CM

## 2012-06-03 DIAGNOSIS — F329 Major depressive disorder, single episode, unspecified: Secondary | ICD-10-CM

## 2012-06-03 MED ORDER — ESCITALOPRAM OXALATE 20 MG PO TABS: 20.0000 mg | ORAL_TABLET | Freq: Every day | ORAL | Status: DC

## 2012-06-03 MED ORDER — METOPROLOL TARTRATE 25 MG PO TABS: 12.5000 mg | ORAL_TABLET | Freq: Every day | ORAL | Status: DC

## 2012-06-03 NOTE — Telephone Encounter (Signed)
Refill- metoprolol tartrate 25mg  tab. Take 1/2 tablet by mouth daily. Qty 15 last fill 4.18.14

## 2012-06-03 NOTE — Telephone Encounter (Signed)
Refill-escitalopram 20mg  tab. Take one tablet by mouth daily. Qty 30 last fill 4.21.14

## 2012-06-03 NOTE — Telephone Encounter (Signed)
Called pharmacy to see if rx went through 04/11/12. Pharmacist stated that they had not received rx. Resent rx.

## 2012-07-01 ENCOUNTER — Other Ambulatory Visit (INDEPENDENT_AMBULATORY_CARE_PROVIDER_SITE_OTHER): Payer: Managed Care, Other (non HMO)

## 2012-07-01 DIAGNOSIS — E785 Hyperlipidemia, unspecified: Secondary | ICD-10-CM

## 2012-07-01 DIAGNOSIS — I10 Essential (primary) hypertension: Secondary | ICD-10-CM

## 2012-07-01 DIAGNOSIS — E039 Hypothyroidism, unspecified: Secondary | ICD-10-CM

## 2012-07-01 LAB — CBC
RBC: 3.93 Mil/uL (ref 3.87–5.11)
RDW: 13.4 % (ref 11.5–14.6)
WBC: 4.4 10*3/uL — ABNORMAL LOW (ref 4.5–10.5)

## 2012-07-01 NOTE — Progress Notes (Signed)
Labs only

## 2012-07-02 LAB — LIPID PANEL
Cholesterol: 175 mg/dL (ref 0–200)
HDL: 34.8 mg/dL — ABNORMAL LOW (ref >39.00)
Total CHOL/HDL Ratio: 5
Triglycerides: 215 mg/dL — ABNORMAL HIGH (ref 0.0–149.0)
VLDL: 43 mg/dL — ABNORMAL HIGH (ref 0.0–40.0)

## 2012-07-02 LAB — RENAL FUNCTION PANEL
CO2: 28 mEq/L (ref 19–32)
Chloride: 109 mEq/L (ref 96–112)
Creatinine, Ser: 1.1 mg/dL (ref 0.4–1.2)
GFR: 55.54 mL/min — ABNORMAL LOW (ref >60.00)
Phosphorus: 4 mg/dL (ref 2.3–4.6)
Sodium: 143 mEq/L (ref 135–145)

## 2012-07-02 LAB — HEPATIC FUNCTION PANEL
AST: 19 U/L (ref 0–37)
Albumin: 4 g/dL (ref 3.5–5.2)
Total Protein: 6.9 g/dL (ref 6.0–8.3)

## 2012-07-02 LAB — LDL CHOLESTEROL, DIRECT: Direct LDL: 111.7 mg/dL

## 2012-07-02 LAB — TSH: TSH: 1.06 u[IU]/mL (ref 0.35–5.50)

## 2012-07-04 ENCOUNTER — Ambulatory Visit: Payer: Managed Care, Other (non HMO) | Admitting: Family Medicine

## 2012-07-07 ENCOUNTER — Other Ambulatory Visit: Payer: Self-pay | Admitting: Family Medicine

## 2012-07-07 DIAGNOSIS — M549 Dorsalgia, unspecified: Secondary | ICD-10-CM

## 2012-07-07 MED ORDER — HYDROCODONE-ACETAMINOPHEN 10-325 MG PO TABS: 1.0000 | ORAL_TABLET | Freq: Four times a day (QID) | ORAL | Status: DC | PRN

## 2012-07-07 NOTE — Telephone Encounter (Signed)
Refill hydrocodone  

## 2012-07-07 NOTE — Telephone Encounter (Signed)
Please advise refill? Last RX was done on 04-11-12 quantity 60 with 1 refill  If ok fax to [phone]

## 2012-07-21 ENCOUNTER — Other Ambulatory Visit: Payer: Self-pay

## 2012-07-21 DIAGNOSIS — F988 Other specified behavioral and emotional disorders with onset usually occurring in childhood and adolescence: Secondary | ICD-10-CM

## 2012-07-21 NOTE — Telephone Encounter (Signed)
Pt left a message stating that she cancelled her last appt and hasnt made another one (tried to get in tomorrow but schedule is full) but is out of her Adderall. Pt would like to know if she can have a refill?   Please advise refill?  Last RX was done on 05-29-12 quantity 60 with 0 refills.

## 2012-07-22 MED ORDER — AMPHETAMINE-DEXTROAMPHETAMINE 20 MG PO TABS: 20.0000 mg | ORAL_TABLET | Freq: Two times a day (BID) | ORAL | Status: DC

## 2012-07-22 NOTE — Telephone Encounter (Signed)
Yes she can have one month of her Adderall til she gets in

## 2012-07-22 NOTE — Telephone Encounter (Signed)
RX printed and put in front cabinet.  Pt informed and states she will schedule when she picks RX up

## 2012-08-13 ENCOUNTER — Encounter: Payer: Self-pay | Admitting: Family Medicine

## 2012-08-13 ENCOUNTER — Ambulatory Visit (INDEPENDENT_AMBULATORY_CARE_PROVIDER_SITE_OTHER): Payer: Managed Care, Other (non HMO) | Admitting: Family Medicine

## 2012-08-13 VITALS — BP 118/82 | HR 69 | Temp 98.2°F | Ht 67.0 in | Wt 200.8 lb

## 2012-08-13 DIAGNOSIS — F329 Major depressive disorder, single episode, unspecified: Secondary | ICD-10-CM

## 2012-08-13 DIAGNOSIS — M549 Dorsalgia, unspecified: Secondary | ICD-10-CM

## 2012-08-13 DIAGNOSIS — F988 Other specified behavioral and emotional disorders with onset usually occurring in childhood and adolescence: Secondary | ICD-10-CM

## 2012-08-13 DIAGNOSIS — E785 Hyperlipidemia, unspecified: Secondary | ICD-10-CM

## 2012-08-13 DIAGNOSIS — I10 Essential (primary) hypertension: Secondary | ICD-10-CM

## 2012-08-13 DIAGNOSIS — R51 Headache: Secondary | ICD-10-CM

## 2012-08-13 DIAGNOSIS — E039 Hypothyroidism, unspecified: Secondary | ICD-10-CM

## 2012-08-13 DIAGNOSIS — F341 Dysthymic disorder: Secondary | ICD-10-CM

## 2012-08-13 MED ORDER — AMPHETAMINE-DEXTROAMPHET ER 30 MG PO CP24: 30.0000 mg | ORAL_CAPSULE | ORAL | Status: DC

## 2012-08-13 MED ORDER — BUTALBITAL-ACETAMINOPHEN 50-300 MG PO TABS: 1.0000 | ORAL_TABLET | Freq: Three times a day (TID) | ORAL | Status: DC | PRN

## 2012-08-13 MED ORDER — HYDROCODONE-ACETAMINOPHEN 10-325 MG PO TABS: 1.0000 | ORAL_TABLET | Freq: Four times a day (QID) | ORAL | Status: DC | PRN

## 2012-08-13 NOTE — Patient Instructions (Addendum)
Consider sleep study 64 oz of clear fluid, small, frequent meals   Migraine Headache A migraine headache is an intense, throbbing pain on one or both sides of your head. A migraine can last for 30 minutes to several hours. CAUSES  The exact cause of a migraine headache is not always known. However, a migraine may be caused when nerves in the brain become irritated and release chemicals that cause inflammation. This causes pain. SYMPTOMS  Pain on one or both sides of your head.  Pulsating or throbbing pain.  Severe pain that prevents daily activities.  Pain that is aggravated by any physical activity.  Nausea, vomiting, or both.  Dizziness.  Pain with exposure to bright lights, loud noises, or activity.  General sensitivity to bright lights, loud noises, or smells. Before you get a migraine, you may get warning signs that a migraine is coming (aura). An aura may include:  Seeing flashing lights.  Seeing bright spots, halos, or zig-zag lines.  Having tunnel vision or blurred vision.  Having feelings of numbness or tingling.  Having trouble talking.  Having muscle weakness. MIGRAINE TRIGGERS  Alcohol.  Smoking.  Stress.  Menstruation.  Aged cheeses.  Foods or drinks that contain nitrates, glutamate, aspartame, or tyramine.  Lack of sleep.  Chocolate.  Caffeine.  Hunger.  Physical exertion.  Fatigue.  Medicines used to treat chest pain (nitroglycerine), birth control pills, estrogen, and some blood pressure medicines. DIAGNOSIS  A migraine headache is often diagnosed based on:  Symptoms.  Physical examination.  A CT scan or MRI of your head. TREATMENT Medicines may be given for pain and nausea. Medicines can also be given to help prevent recurrent migraines.  HOME CARE INSTRUCTIONS  Only take over-the-counter or prescription medicines for pain or discomfort as directed by your caregiver. The use of long-term narcotics is not recommended.  Lie  down in a dark, quiet room when you have a migraine.  Keep a journal to find out what may trigger your migraine headaches. For example, write down:  What you eat and drink.  How much sleep you get.  Any change to your diet or medicines.  Limit alcohol consumption.  Quit smoking if you smoke.  Get 7 to 9 hours of sleep, or as recommended by your caregiver.  Limit stress.  Keep lights dim if bright lights bother you and make your migraines worse. SEEK IMMEDIATE MEDICAL CARE IF:   Your migraine becomes severe.  You have a fever.  You have a stiff neck.  You have vision loss.  You have muscular weakness or loss of muscle control.  You start losing your balance or have trouble walking.  You feel faint or pass out.  You have severe symptoms that are different from your first symptoms. MAKE SURE YOU:   Understand these instructions.  Will watch your condition.  Will get help right away if you are not doing well or get worse. Document Released: 01/01/2005 Document Revised: 03/26/2011 Document Reviewed: 12/22/2010 [facility] Patient Information 2014 [place], [place].

## 2012-08-14 ENCOUNTER — Encounter: Payer: Self-pay | Admitting: Family Medicine

## 2012-08-14 NOTE — Progress Notes (Signed)
Patient ID: Joan Robinson, female   DOB: [birth year], 52 y.o.   MRN: [id number] Joan Robinson [id number] 07-20-1960 08/14/2012      Progress Note-Follow Up  Subjective  Chief Complaint  Chief Complaint  Patient presents with  . Follow-up    on medications    HPI  Patient is a 52 year old female who is in today for followup. She is generally doing well. She has been struggling with an hip and back pain. Has had a headache for several days now. The headache has had nausea photophobia and phonophobia visit. No fevers or chills. No recent illness. No chest pain, palpitations, shortness of breath, GI or GU concerns are otherwise noted today he  Past Medical History  Diagnosis Date  . Allergic rhinitis   . Adenomatous colon polyp   . Diverticulosis   . Colitis, Clostridium difficile 5/11,6/11  . Hypothyroidism   . Urinary incontinence   . PUD (peptic ulcer disease)   . Tobacco abuse   . Hypertension   . Hyperlipidemia   . Colon polyp   . Anemia 10/10/2011  . Preventative health care 10/10/2011  . Sinusitis acute 10/10/2011  . Chicken pox as a child    X 2  . Mumps as a child  . Lower back pain   . 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 Staph aureus infection 12/18/2011    Recurrent lesions in nares  . Perimenopausal 01/16/2012  . Anxiety and depression 01/17/2007    Qualifier: Diagnosis of  By: Everardo All MD, Cleophas Dunker   . 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 Right knee pain 05/10/2012  . BCC (basal cell carcinoma of skin) 06/01/2012    Leg Follows with Dr Margo Aye    Past Surgical History  Procedure Laterality Date  . Tubal ligation  1995  . Uterine suspension      mesh  . Colonoscopy    . Gated spect wall motion stress cardiolite  11/05/2001  . Polypectomy    . Abdominal hysterectomy    . Abdominal hysterectomy  2011    complete  . Dilation and curettage of uterus  1985    Family History  Problem Relation Age of Onset  . Heart disease Mother     stents  . Stroke Mother   .  Hyperlipidemia Mother   . Hypertension Mother   . Other Mother     blood disorder- mgus  . Colon polyps Father   . Other Father     aorta disection  . Aneurysm Father   . Colon cancer Paternal Uncle   . Irritable bowel syndrome Daughter   . Other Daughter     gastritis  . Mental illness Daughter     bipolar and mood disorder  . Hypertension Sister     ?  Marland Kitchen Mental illness Son     bipolar  . Arthritis Sister   . Other Sister     thyroid  . Other Sister     thyroid    History   Social History  . Marital Status: Married    Spouse Name: N/A    Number of Children: 2  . Years of Education: N/A   Occupational History  . 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 Smoking status: Former Smoker -- 1.00 packs/day for 30 years    Types: Cigarettes    Quit date: 05/15/2009  . Smokeless tobacco: Never Used  . Alcohol Use: No  . Drug Use: No  . Sexually Active:  No   Other Topics Concern  . Not on file   Social History Narrative  . No narrative on file    Current Outpatient Prescriptions on File Prior to Visit  Medication Sig Dispense Refill  . ALPRAZolam (XANAX) 0.25 MG tablet Take 1 tablet (0.25 mg total) by mouth at bedtime as needed for sleep or anxiety.  30 tablet  1  . calcium carbonate (TUMS E-X 750) 750 MG chewable tablet Chew 2 tablets by mouth daily.        Marland Kitchen escitalopram (LEXAPRO) 20 MG tablet Take 1 tablet (20 mg total) by mouth daily.  30 tablet  6  . fexofenadine (ALLEGRA) 180 MG tablet Take 180 mg by mouth daily.      Marland Kitchen levothyroxine (SYNTHROID, LEVOTHROID) 88 MCG tablet Take 1 tablet (88 mcg total) by mouth daily.  30 tablet  5  . Loperamide HCl (IMODIUM A-D PO) Take by mouth.      . metoprolol tartrate (LOPRESSOR) 25 MG tablet Take 0.5 tablets (12.5 mg total) by mouth daily. 1/2 tablet by mouth once daily  45 tablet  3  . nitroGLYCERIN (NITROSTAT) 0.4 MG SL tablet Place 0.4 mg under the tongue every 5 (five) minutes as needed.        Marland Kitchen  omeprazole (PRILOSEC) 40 MG capsule Take 1 capsule (40 mg total) by mouth daily.  90 capsule  3  . ondansetron (ZOFRAN) 4 MG tablet Take 1 tablet (4 mg total) by mouth every 6 (six) hours as needed.  20 tablet  5  . Probiotic Product (PROBIOTIC FORMULA) CAPS Take 1 capsule by mouth daily.        . ranitidine (ZANTAC) 150 MG tablet Take 1 tablet (150 mg total) by mouth 2 (two) times daily.  180 tablet  3   No current facility-administered medications on file prior to visit.    Allergies  Allergen Reactions  . Keflex (Cephalexin) Diarrhea  . Aspirin     REACTION: difficulty breathing  . Dicyclomine Hcl     REACTION: mouth ulcers  . Metronidazole     REACTION: hives, mouth ulcers  . Moxifloxacin     REACTION: increased heart rate, nausea  . Nsaids     REACTION: difficulty breathing  . Phenergan (Promethazine Hcl)     "knocks" pt out for about 3 days  . Sulfamethoxazole W-Trimethoprim     REACTION: increased heart rate, nausea    Review of Systems  Review of Systems  Constitutional: Negative for fever and malaise/fatigue.  HENT: Negative for congestion.   Eyes: Positive for photophobia. Negative for pain and discharge.  Respiratory: Negative for shortness of breath.   Cardiovascular: Negative for chest pain, palpitations and leg swelling.  Gastrointestinal: Positive for nausea. Negative for heartburn, vomiting, abdominal pain and diarrhea.  Genitourinary: Negative for dysuria.  Musculoskeletal: Positive for joint pain. Negative for falls.  Skin: Negative for rash.  Neurological: Positive for headaches. Negative for loss of consciousness.  Endo/Heme/Allergies: Negative for polydipsia.  Psychiatric/Behavioral: Negative for depression and suicidal ideas. The patient is not nervous/anxious and does not have insomnia.     Objective  BP 118/82  Pulse 69  Temp(Src) 98.2 F (36.8 C) (Oral)  Ht 5\' 7"  (1.702 m)  Wt 200 lb 12.8 oz (91.082 kg)  BMI 31.44 kg/m2  SpO2  97%  Physical Exam  Physical Exam  Constitutional: She is oriented to person, place, and time and well-developed, well-nourished, and in no distress. No distress.  HENT:  Head: Normocephalic  and atraumatic.  Eyes: Conjunctivae are normal.  Neck: Neck supple. No thyromegaly present.  Cardiovascular: Normal rate, regular rhythm and normal heart sounds.   No murmur heard. Pulmonary/Chest: Effort normal and breath sounds normal. She has no wheezes.  Abdominal: She exhibits no distension and no mass.  Musculoskeletal: She exhibits no edema.  Lymphadenopathy:    She has no cervical adenopathy.  Neurological: She is alert and oriented to person, place, and time.  Skin: Skin is warm and dry. No rash noted. She is not diaphoretic.  Psychiatric: Memory, affect and judgment normal.    Lab Results  Component Value Date   TSH 1.06 07/01/2012   Lab Results  Component Value Date   WBC 4.4* 07/01/2012   HGB 11.8* 07/01/2012   HCT 35.0* 07/01/2012   MCV 89.0 07/01/2012   PLT 214.0 07/01/2012   Lab Results  Component Value Date   CREATININE 1.1 07/01/2012   BUN 9 07/01/2012   NA 143 07/01/2012   K 4.3 07/01/2012   CL 109 07/01/2012   CO2 28 07/01/2012   Lab Results  Component Value Date   ALT 17 07/01/2012   AST 19 07/01/2012   ALKPHOS 93 07/01/2012   BILITOT 0.6 07/01/2012   Lab Results  Component Value Date   CHOL 175 07/01/2012   Lab Results  Component Value Date   HDL 34.80* 07/01/2012   No results found for this basename: LDLCALC   Lab Results  Component Value Date   TRIG 215.0* 07/01/2012   Lab Results  Component Value Date   CHOLHDL 5 07/01/2012     Assessment & Plan  HYPOTHYROIDISM Well treated, no changes to meds today  HYPERTENSION Well controlled, no changes to meds   HYPERLIPIDEMIA Avoid trans fats, increase exercise, start Krill oil caps.   Headache(784.0) Increase hydration and rest, use Bupap prn. No further changes.  ADD (attention deficit  disorder) Tolerating Adderall  Anxiety and depression Doing well on Lexapro

## 2012-08-17 ENCOUNTER — Encounter: Payer: Self-pay | Admitting: Family Medicine

## 2012-08-17 DIAGNOSIS — R519 Headache, unspecified: Secondary | ICD-10-CM | POA: Insufficient documentation

## 2012-08-17 DIAGNOSIS — R51 Headache: Secondary | ICD-10-CM

## 2012-08-17 HISTORY — DX: Headache: R51

## 2012-08-17 NOTE — Assessment & Plan Note (Signed)
Avoid trans fats, increase exercise, start Krill oil caps.

## 2012-08-17 NOTE — Assessment & Plan Note (Signed)
Well controlled, no changes to meds 

## 2012-08-17 NOTE — Assessment & Plan Note (Signed)
Tolerating Adderall 

## 2012-08-17 NOTE — Assessment & Plan Note (Signed)
Doing well on Lexapro

## 2012-08-17 NOTE — Assessment & Plan Note (Signed)
Well treated, no changes to meds today

## 2012-08-17 NOTE — Assessment & Plan Note (Signed)
Increase hydration and rest, use Bupap prn. No further changes.

## 2012-09-19 ENCOUNTER — Ambulatory Visit (INDEPENDENT_AMBULATORY_CARE_PROVIDER_SITE_OTHER): Payer: Managed Care, Other (non HMO) | Admitting: Family Medicine

## 2012-09-19 ENCOUNTER — Encounter: Payer: Self-pay | Admitting: Family Medicine

## 2012-09-19 VITALS — BP 118/80 | HR 66 | Temp 98.6°F | Ht 67.0 in | Wt 197.0 lb

## 2012-09-19 DIAGNOSIS — E039 Hypothyroidism, unspecified: Secondary | ICD-10-CM

## 2012-09-19 DIAGNOSIS — I10 Essential (primary) hypertension: Secondary | ICD-10-CM

## 2012-09-19 DIAGNOSIS — F329 Major depressive disorder, single episode, unspecified: Secondary | ICD-10-CM

## 2012-09-19 DIAGNOSIS — F988 Other specified behavioral and emotional disorders with onset usually occurring in childhood and adolescence: Secondary | ICD-10-CM

## 2012-09-19 DIAGNOSIS — L508 Other urticaria: Secondary | ICD-10-CM

## 2012-09-19 MED ORDER — AMPHETAMINE-DEXTROAMPHETAMINE 20 MG PO TABS: 20.0000 mg | ORAL_TABLET | Freq: Two times a day (BID) | ORAL | Status: DC

## 2012-09-19 MED ORDER — FEXOFENADINE HCL 180 MG PO TABS: 180.0000 mg | ORAL_TABLET | Freq: Two times a day (BID) | ORAL | Status: DC

## 2012-09-19 MED ORDER — ALPRAZOLAM 0.25 MG PO TABS: 0.2500 mg | ORAL_TABLET | Freq: Three times a day (TID) | ORAL | Status: DC | PRN

## 2012-09-19 NOTE — Patient Instructions (Addendum)
Try Benadryl 12.5 to 25 mg at bedtime for hives or insomnia   Hives Hives are itchy, red, swollen areas of the skin. They can vary in size and location on your body. Hives can come and go for hours or several days (acute hives) or for several weeks (chronic hives). Hives do not spread from person to person (noncontagious). They may get worse with scratching, exercise, and emotional stress. CAUSES   Allergic reaction to food, additives, or drugs.  Infections, including the common cold.  Illness, such as vasculitis, lupus, or thyroid disease.  Exposure to sunlight, heat, or cold.  Exercise.  Stress.  Contact with chemicals. SYMPTOMS   Red or white swollen patches on the skin. The patches may change size, shape, and location quickly and repeatedly.  Itching.  Swelling of the hands, feet, and face. This may occur if hives develop deeper in the skin. DIAGNOSIS  Your caregiver can usually tell what is wrong by performing a physical exam. Skin or blood tests may also be done to determine the cause of your hives. In some cases, the cause cannot be determined. TREATMENT  Mild cases usually get better with medicines such as antihistamines. Severe cases may require an emergency epinephrine injection. If the cause of your hives is known, treatment includes avoiding that trigger.  HOME CARE INSTRUCTIONS   Avoid causes that trigger your hives.  Take antihistamines as directed by your caregiver to reduce the severity of your hives. Non-sedating or low-sedating antihistamines are usually recommended. Do not drive while taking an antihistamine.  Take any other medicines prescribed for itching as directed by your caregiver.  Wear loose-fitting clothing.  Keep all follow-up appointments as directed by your caregiver. SEEK MEDICAL CARE IF:   You have persistent or severe itching that is not relieved with medicine.  You have painful or swollen joints. SEEK IMMEDIATE MEDICAL CARE IF:   You  have a fever.  Your tongue or lips are swollen.  You have trouble breathing or swallowing.  You feel tightness in the throat or chest.  You have abdominal pain. These problems may be the first sign of a life-threatening allergic reaction. Call your local emergency services (911 in [place] MAKE SURE YOU:   Understand these instructions.  Will watch your condition.  Will get help right away if you are not doing well or get worse. Document Released: 01/01/2005 Document Revised: 07/03/2011 Document Reviewed: 03/27/2011 [facility] Patient Information 2014 [place], [place].

## 2012-09-22 ENCOUNTER — Telehealth: Payer: Self-pay

## 2012-09-22 DIAGNOSIS — L508 Other urticaria: Secondary | ICD-10-CM

## 2012-09-22 MED ORDER — MONTELUKAST SODIUM 10 MG PO TABS: 10.0000 mg | ORAL_TABLET | Freq: Every day | ORAL | Status: DC | PRN

## 2012-09-22 MED ORDER — FEXOFENADINE HCL 180 MG PO TABS: 180.0000 mg | ORAL_TABLET | Freq: Two times a day (BID) | ORAL | Status: DC

## 2012-09-22 NOTE — Assessment & Plan Note (Signed)
Well controlled, no changes to Levothyroxine 

## 2012-09-22 NOTE — Assessment & Plan Note (Signed)
Has tolerated Adderall 20 mg po bid best. Given refill

## 2012-09-22 NOTE — Progress Notes (Signed)
Patient ID: Joan Robinson, female   DOB: 09/24/1960, 52 y.o.   MRN: [id number] Joan Robinson [id number] 03-23-1960 09/22/2012      Progress Note-Follow Up  Subjective  Chief Complaint  Chief Complaint  Patient presents with  . Medication Refill    HPI  Patient is a 52 year old female in today for reevaluation of ADD minutes. She has ceased and his release Adderall caused her to be very irritable and has trouble concentrating she tolerates the short acting much better. Chest pain or palpitations. No shortness of breath GI or GU concerns. Has had some recent trouble with urticaria  Past Medical History  Diagnosis Date  . Allergic rhinitis   . Adenomatous colon polyp   . Diverticulosis   . Colitis, Clostridium difficile 5/11,6/11  . Hypothyroidism   . Urinary incontinence   . PUD (peptic ulcer disease)   . Tobacco abuse   . Hypertension   . Hyperlipidemia   . Colon polyp   . Anemia 10/10/2011  . Preventative health care 10/10/2011  . Sinusitis acute 10/10/2011  . Chicken pox as a child    X 2  . Mumps as a child  . Lower back pain   . 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 Staph aureus infection 12/18/2011    Recurrent lesions in nares  . Perimenopausal 01/16/2012  . Anxiety and depression 01/17/2007    Qualifier: Diagnosis of  By: Everardo All MD, Cleophas Dunker   . 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 Right knee pain 05/10/2012  . BCC (basal cell carcinoma of skin) 06/01/2012    Leg Follows with Dr Margo Aye  . BMWUXLKG(401.0) 08/17/2012    Past Surgical History  Procedure Laterality Date  . Tubal ligation  1995  . Uterine suspension      mesh  . Colonoscopy    . Gated spect wall motion stress cardiolite  11/05/2001  . Polypectomy    . Abdominal hysterectomy    . Abdominal hysterectomy  2011    complete  . Dilation and curettage of uterus  1985    Family History  Problem Relation Age of Onset  . Heart disease Mother     stents  . Stroke Mother   . Hyperlipidemia Mother   .  Hypertension Mother   . Other Mother     blood disorder- mgus  . Colon polyps Father   . Other Father     aorta disection  . Aneurysm Father   . Colon cancer Paternal Uncle   . Irritable bowel syndrome Daughter   . Other Daughter     gastritis  . Mental illness Daughter     bipolar and mood disorder  . Hypertension Sister     ?  Marland Kitchen Mental illness Son     bipolar  . Arthritis Sister   . Other Sister     thyroid  . Other Sister     thyroid    History   Social History  . Marital Status: Married    Spouse Name: N/A    Number of Children: 2  . Years of Education: N/A   Occupational History  . 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 Smoking status: Former Smoker -- 1.00 packs/day for 30 years    Types: Cigarettes    Quit date: 05/15/2009  . Smokeless tobacco: Never Used  . Alcohol Use: No  . Drug Use: No  . Sexual Activity: No   Other Topics Concern  . Not on file  Social History Narrative  . No narrative on file    Current Outpatient Prescriptions on File Prior to Visit  Medication Sig Dispense Refill  . Butalbital-Acetaminophen 50-300 MG TABS Take 1 tablet by mouth 3 (three) times daily as needed.  60 tablet  1  . calcium carbonate (TUMS E-X 750) 750 MG chewable tablet Chew 2 tablets by mouth daily.        Marland Kitchen escitalopram (LEXAPRO) 20 MG tablet Take 1 tablet (20 mg total) by mouth daily.  30 tablet  6  . HYDROcodone-acetaminophen (NORCO) 10-325 MG per tablet Take 1 tablet by mouth every 6 (six) hours as needed for pain.  60 tablet  1  . levothyroxine (SYNTHROID, LEVOTHROID) 88 MCG tablet Take 1 tablet (88 mcg total) by mouth daily.  30 tablet  5  . Loperamide HCl (IMODIUM A-D PO) Take by mouth.      . metoprolol tartrate (LOPRESSOR) 25 MG tablet Take 0.5 tablets (12.5 mg total) by mouth daily. 1/2 tablet by mouth once daily  45 tablet  3  . nitroGLYCERIN (NITROSTAT) 0.4 MG SL tablet Place 0.4 mg under the tongue every 5 (five) minutes as needed.         Marland Kitchen omeprazole (PRILOSEC) 40 MG capsule Take 1 capsule (40 mg total) by mouth daily.  90 capsule  3  . ondansetron (ZOFRAN) 4 MG tablet Take 1 tablet (4 mg total) by mouth every 6 (six) hours as needed.  20 tablet  5  . Probiotic Product (PROBIOTIC FORMULA) CAPS Take 1 capsule by mouth daily.        . ranitidine (ZANTAC) 150 MG tablet Take 1 tablet (150 mg total) by mouth 2 (two) times daily.  180 tablet  3   No current facility-administered medications on file prior to visit.    Allergies  Allergen Reactions  . Keflex [Cephalexin] Diarrhea  . Aspirin     REACTION: difficulty breathing  . Dicyclomine Hcl     REACTION: mouth ulcers  . Metronidazole     REACTION: hives, mouth ulcers  . Moxifloxacin     REACTION: increased heart rate, nausea  . Nsaids     REACTION: difficulty breathing  . Phenergan [Promethazine Hcl]     "knocks" pt out for about 3 days  . Sulfamethoxazole W-Trimethoprim     REACTION: increased heart rate, nausea    Review of Systems  Review of Systems  Constitutional: Negative for fever and malaise/fatigue.  HENT: Negative for congestion.   Eyes: Negative for discharge.  Respiratory: Negative for shortness of breath.   Cardiovascular: Negative for chest pain, palpitations and leg swelling.  Gastrointestinal: Negative for nausea, abdominal pain and diarrhea.  Genitourinary: Negative for dysuria.  Musculoskeletal: Negative for falls.  Skin: Positive for itching and rash.  Neurological: Negative for loss of consciousness and headaches.  Endo/Heme/Allergies: Negative for polydipsia.  Psychiatric/Behavioral: Negative for depression and suicidal ideas. The patient is not nervous/anxious and does not have insomnia.     Objective  BP 118/80  Pulse 66  Temp(Src) 98.6 F (37 C) (Oral)  Ht 5\' 7"  (1.702 m)  Wt 197 lb 0.6 oz (89.377 kg)  BMI 30.85 kg/m2  SpO2 98%  Physical Exam  Physical Exam  Constitutional: She is oriented to person, place, and time and  well-developed, well-nourished, and in no distress. No distress.  HENT:  Head: Normocephalic and atraumatic.  Eyes: Conjunctivae are normal.  Neck: Neck supple. No thyromegaly present.  Cardiovascular: Normal rate, regular rhythm and normal  heart sounds.   No murmur heard. Pulmonary/Chest: Effort normal and breath sounds normal. She has no wheezes.  Abdominal: She exhibits no distension and no mass.  Musculoskeletal: She exhibits no edema.  Lymphadenopathy:    She has no cervical adenopathy.  Neurological: She is alert and oriented to person, place, and time.  Skin: Skin is warm and dry. No rash noted. She is not diaphoretic.  Psychiatric: Memory, affect and judgment normal.    Lab Results  Component Value Date   TSH 1.06 07/01/2012   Lab Results  Component Value Date   WBC 4.4* 07/01/2012   HGB 11.8* 07/01/2012   HCT 35.0* 07/01/2012   MCV 89.0 07/01/2012   PLT 214.0 07/01/2012   Lab Results  Component Value Date   CREATININE 1.1 07/01/2012   BUN 9 07/01/2012   NA 143 07/01/2012   K 4.3 07/01/2012   CL 109 07/01/2012   CO2 28 07/01/2012   Lab Results  Component Value Date   ALT 17 07/01/2012   AST 19 07/01/2012   ALKPHOS 93 07/01/2012   BILITOT 0.6 07/01/2012   Lab Results  Component Value Date   CHOL 175 07/01/2012   Lab Results  Component Value Date   HDL 34.80* 07/01/2012   No results found for this basename: [facility]   Lab Results  Component Value Date   TRIG 215.0* 07/01/2012   Lab Results  Component Value Date   CHOLHDL 5 07/01/2012     Assessment & Plan  HYPERTENSION Well controlled, no changes.   HYPOTHYROIDISM Well controlled, no changes to Levothyroxine  ADD (attention deficit disorder) Has tolerated Adderall 20 mg po bid best. Given refill

## 2012-09-22 NOTE — Telephone Encounter (Signed)
[facility] called stating that pt came in over the weekend to pick up her Allegra RX? [facility] states they didn't receive this. I will resend

## 2012-09-22 NOTE — Telephone Encounter (Signed)
She can have an rx for Singulair 10 mg tab 1 tab po daily prn alllergies, disp #30 with 5 rf

## 2012-09-22 NOTE — Telephone Encounter (Signed)
[facility] is calling stating pt is looking for a Singulair RX? [facility] stated in message that an Allegra RX was sent and pt did pick this one up but is also wanting Singulair?  Please advise?

## 2012-09-22 NOTE — Telephone Encounter (Signed)
RX sent

## 2012-09-22 NOTE — Assessment & Plan Note (Signed)
Well controlled, no changes 

## 2012-10-13 ENCOUNTER — Encounter (INDEPENDENT_AMBULATORY_CARE_PROVIDER_SITE_OTHER): Payer: Self-pay | Admitting: Ophthalmology

## 2012-10-16 ENCOUNTER — Ambulatory Visit: Payer: Self-pay | Admitting: Physician Assistant

## 2012-10-16 ENCOUNTER — Telehealth: Payer: Self-pay | Admitting: Family Medicine

## 2012-10-16 NOTE — Telephone Encounter (Signed)
Patient called and stated she wasn't going to be able to come in for an appt. Pt asked if I could email her the notes from the other doctor? I informed patient that we can't email the paperwork to her. Pt stated she understood and thanked me

## 2012-10-16 NOTE — Telephone Encounter (Signed)
Patient Information:  Caller Name: Donte  Phone: [phone]  Patient: Joan Robinson, Joan Robinson  Gender: Female  DOB: 04/05/1960  Age: 52 Years  PCP: Danise Edge [facility])  Pregnant: No  Office Follow Up:  Does the office need to follow up with this patient?: No  Instructions For The Office: N/A   Symptoms  Reason For Call & Symptoms: Pt was seen 70month ago for hives  and she was placed on a med, but they are getting worse. She is requesting a steroid injection.  Reviewed Health History In EMR: Yes  Reviewed Medications In EMR: Yes  Reviewed Allergies In EMR: Yes  Reviewed Surgeries / Procedures: Yes  Date of Onset of Symptoms: 09/16/2012  Treatments Tried: Allergra Zactac, Singular and still better  Treatments Tried Worked: No OB / GYN:  LMP: Unknown  Guideline(s) Used:  Hives  Disposition Per Guideline:   Go to ED Now (or to Office with PCP Approval)  Reason For Disposition Reached:   Patient sounds very sick or weak to the triager  Advice Given:  N/A  Patient Will Follow Care Advice:  YES  Appointment Scheduled:  10/16/2012 15:00:00 Appointment Scheduled Provider:  Marcelline Mates "Selena Batten"

## 2012-10-16 NOTE — Telephone Encounter (Signed)
Patient Information:  Caller Name: Laurelin  Phone: [phone]  Patient: Joan Robinson, Joan Robinson  Gender: Female  DOB: 04-May-1960  Age: 52 Years  PCP: Danise Edge [facility])  Pregnant: No  Office Follow Up:  Does the office need to follow up with this patient?: No  Instructions For The Office: N/A   Symptoms  Reason For Call & Symptoms: Pt calling that she needs the files from her immune Dr. sent STAT to this office for her appt at 1500 this afternoon, 10/16/12.  Please gt pt's records from  Dr. Almon Register, fax # is [phone].  TY!  Reviewed Health History In EMR: N/A  Reviewed Medications In EMR: N/A  Reviewed Allergies In EMR: N/A  Reviewed Surgeries / Procedures: N/A  Date of Onset of Symptoms: Unknown OB / GYN:  LMP: Unknown  Guideline(s) Used:  No Protocol Available - Sick Adult  Disposition Per Guideline:   See Today in Office  Reason For Disposition Reached:   Patient wants to be seen  Advice Given:  N/A  Patient Will Follow Care Advice:  YES

## 2012-10-17 ENCOUNTER — Telehealth: Payer: Self-pay | Admitting: *Deleted

## 2012-10-17 DIAGNOSIS — E039 Hypothyroidism, unspecified: Secondary | ICD-10-CM

## 2012-10-17 DIAGNOSIS — R51 Headache: Secondary | ICD-10-CM

## 2012-10-17 MED ORDER — LEVOTHYROXINE SODIUM 88 MCG PO TABS: 88.0000 ug | ORAL_TABLET | Freq: Every day | ORAL | Status: DC

## 2012-10-17 MED ORDER — BUTALBITAL-ACETAMINOPHEN 50-300 MG PO TABS: 1.0000 | ORAL_TABLET | Freq: Three times a day (TID) | ORAL | Status: DC | PRN

## 2012-10-17 NOTE — Telephone Encounter (Signed)
Faxed refill request received from pharmacy for BUPAP [phone] Last filled by MD on 07.30.14 Last AEX - 09.05.14 Next AEX - 6-wks [appt scheduled for 10.17.14] Please Advise/SLS

## 2012-10-17 NOTE — Telephone Encounter (Signed)
Rx request to pharmacy/SLS  

## 2012-10-17 NOTE — Telephone Encounter (Signed)
OK to send 60 tabs with zero refills.  

## 2012-10-22 ENCOUNTER — Encounter (INDEPENDENT_AMBULATORY_CARE_PROVIDER_SITE_OTHER): Payer: 59 | Admitting: Ophthalmology

## 2012-10-22 DIAGNOSIS — H2510 Age-related nuclear cataract, unspecified eye: Secondary | ICD-10-CM

## 2012-10-22 DIAGNOSIS — H35379 Puckering of macula, unspecified eye: Secondary | ICD-10-CM

## 2012-10-22 DIAGNOSIS — H43819 Vitreous degeneration, unspecified eye: Secondary | ICD-10-CM

## 2012-10-22 DIAGNOSIS — I10 Essential (primary) hypertension: Secondary | ICD-10-CM

## 2012-10-22 DIAGNOSIS — H35039 Hypertensive retinopathy, unspecified eye: Secondary | ICD-10-CM

## 2012-10-31 ENCOUNTER — Ambulatory Visit: Payer: Managed Care, Other (non HMO) | Admitting: Family Medicine

## 2012-11-14 ENCOUNTER — Ambulatory Visit: Payer: Managed Care, Other (non HMO) | Admitting: Family Medicine

## 2012-11-16 ENCOUNTER — Ambulatory Visit (INDEPENDENT_AMBULATORY_CARE_PROVIDER_SITE_OTHER): Payer: 59 | Admitting: Physician Assistant

## 2012-11-16 VITALS — BP 118/84 | HR 62 | Temp 98.3°F | Resp 14 | Ht 67.0 in | Wt 196.0 lb

## 2012-11-16 DIAGNOSIS — H6121 Impacted cerumen, right ear: Secondary | ICD-10-CM

## 2012-11-16 DIAGNOSIS — H9209 Otalgia, unspecified ear: Secondary | ICD-10-CM

## 2012-11-16 DIAGNOSIS — J029 Acute pharyngitis, unspecified: Secondary | ICD-10-CM

## 2012-11-16 DIAGNOSIS — H6120 Impacted cerumen, unspecified ear: Secondary | ICD-10-CM

## 2012-11-16 DIAGNOSIS — H9201 Otalgia, right ear: Secondary | ICD-10-CM

## 2012-11-16 LAB — POCT SKIN KOH: Skin KOH, POC: NEGATIVE

## 2012-11-16 LAB — POCT RAPID STREP A (OFFICE): Rapid Strep A Screen: NEGATIVE

## 2012-11-16 MED ORDER — AMOXICILLIN 875 MG PO TABS: 875.0000 mg | ORAL_TABLET | Freq: Two times a day (BID) | ORAL | Status: DC

## 2012-11-16 NOTE — Progress Notes (Signed)
Subjective:    Patient ID: Joan Robinson, female    DOB: February 02, 1960, 52 y.o.   MRN: [id number]  Sore Throat  Associated symptoms include congestion, ear pain (right, fullness) and headaches. Pertinent negatives include no abdominal pain, shortness of breath, trouble swallowing or vomiting.  Headache  Associated symptoms include ear pain (right, fullness) and a sore throat. Pertinent negatives include no abdominal pain, dizziness, fever, nausea, rhinorrhea, sinus pressure or vomiting.  Otalgia  Associated symptoms include headaches and a sore throat. Pertinent negatives include no abdominal pain, rhinorrhea or vomiting.   52 year old female presents for evaluation of sore throat x 2 days. Is concerned because her grandson was treated for strep last week.  Admits to painful swallowing.  Also has slight nasal congestion and "stuffiness." Denies fever, chills, cough, wheezing, SOB, abdominal pain, or dizziness. Complains of right ear pain and fullness. Also has had about 1 month of "mouth pain" that is a burning pain.  Has not notice any ulcers or blisters.  She has not taken any medications for this.  Patient is otherwise doing well with no other concerns today.     Review of Systems  Constitutional: Negative for fever and chills.  HENT: Positive for congestion, ear pain (right, fullness) and sore throat. Negative for postnasal drip, rhinorrhea, sinus pressure and trouble swallowing.   Respiratory: Negative for choking, shortness of breath and wheezing.   Gastrointestinal: Negative for nausea, vomiting and abdominal pain.  Neurological: Positive for headaches. Negative for dizziness.       Objective:   Physical Exam  Constitutional: She is oriented to person, place, and time. She appears well-developed and well-nourished.  HENT:  Head: Normocephalic and atraumatic.  Right Ear: Hearing, external ear and ear canal normal.  Left Ear: Hearing, tympanic membrane, external ear and ear canal  normal.  Mouth/Throat: Uvula is midline, oropharynx is clear and moist and mucous membranes are normal.    Right cerumen impaction. Normal TM s/p irrigation  Eyes: Conjunctivae are normal.  Neck: Normal range of motion. Neck supple.  Cardiovascular: Normal rate, regular rhythm and normal heart sounds.   Pulmonary/Chest: Effort normal and breath sounds normal.  Lymphadenopathy:    She has no cervical adenopathy.  Neurological: She is alert and oriented to person, place, and time.  Psychiatric: She has a normal mood and affect. Her behavior is normal. Judgment and thought content normal.     Results for orders placed in visit on 11/16/12  POCT RAPID STREP A (OFFICE)      Result Value Range   Rapid Strep A Screen Negative  Negative  POCT SKIN KOH      Result Value Range   Skin KOH, POC Negative          Assessment & Plan:  Acute pharyngitis - Plan: POCT rapid strep A, POCT Skin KOH, Culture, Group A Strep, amoxicillin (AMOXIL) 875 MG tablet  Despite negative strep with cover with amoxicillin due to exposure Throat culture pending Increase fluids and rest Patient declined rx for [facility] mouthwash Follow up if symptoms worsen or fail to improve.

## 2012-11-18 LAB — CULTURE, GROUP A STREP: Organism ID, Bacteria: NORMAL

## 2012-11-21 ENCOUNTER — Encounter: Payer: Self-pay | Admitting: Family Medicine

## 2012-11-21 ENCOUNTER — Ambulatory Visit (INDEPENDENT_AMBULATORY_CARE_PROVIDER_SITE_OTHER): Payer: Managed Care, Other (non HMO) | Admitting: Family Medicine

## 2012-11-21 VITALS — BP 120/90 | HR 79 | Temp 98.4°F | Resp 16 | Ht 67.0 in | Wt 197.1 lb

## 2012-11-21 DIAGNOSIS — I10 Essential (primary) hypertension: Secondary | ICD-10-CM

## 2012-11-21 DIAGNOSIS — F319 Bipolar disorder, unspecified: Secondary | ICD-10-CM

## 2012-11-21 DIAGNOSIS — L509 Urticaria, unspecified: Secondary | ICD-10-CM

## 2012-11-21 DIAGNOSIS — F988 Other specified behavioral and emotional disorders with onset usually occurring in childhood and adolescence: Secondary | ICD-10-CM

## 2012-11-21 DIAGNOSIS — F329 Major depressive disorder, single episode, unspecified: Secondary | ICD-10-CM

## 2012-11-21 DIAGNOSIS — R112 Nausea with vomiting, unspecified: Secondary | ICD-10-CM

## 2012-11-21 MED ORDER — QUETIAPINE FUMARATE 100 MG PO TABS: ORAL_TABLET | ORAL | Status: DC

## 2012-11-21 MED ORDER — METHYLPREDNISOLONE ACETATE 40 MG/ML IJ SUSP: 40.0000 mg | Freq: Once | INTRAMUSCULAR | Status: DC

## 2012-11-21 MED ORDER — ALPRAZOLAM 0.25 MG PO TABS: 0.2500 mg | ORAL_TABLET | Freq: Three times a day (TID) | ORAL | Status: DC | PRN

## 2012-11-21 MED ORDER — ONDANSETRON HCL 4 MG PO TABS: 4.0000 mg | ORAL_TABLET | Freq: Four times a day (QID) | ORAL | Status: DC | PRN

## 2012-11-21 MED ORDER — AMPHETAMINE-DEXTROAMPHET ER 20 MG PO CP24: 20.0000 mg | ORAL_CAPSULE | ORAL | Status: DC

## 2012-11-21 MED ORDER — ESCITALOPRAM OXALATE 20 MG PO TABS: 10.0000 mg | ORAL_TABLET | Freq: Every day | ORAL | Status: DC

## 2012-11-21 MED ORDER — METHYLPREDNISOLONE (PAK) 4 MG PO TABS: ORAL_TABLET | ORAL | Status: DC

## 2012-11-21 NOTE — Patient Instructions (Signed)
Bipolar Disorder °Bipolar disorder is a mental illness. The term bipolar disorder actually is used to describe a group of disorders that all share varying degrees of emotional highs and lows that can interfere with daily functioning, such as work, school, or relationships. Bipolar disorder also can lead to drug abuse, hospitalization, and suicide. °The emotional highs of bipolar disorder are periods of elation or irritability and high energy. These highs can range from a mild form (hypomania) to a severe form (mania). People experiencing episodes of hypomania may appear energetic, excitable, and highly productive. People experiencing mania may behave impulsively or erratically. They often make poor decisions. They may have difficulty sleeping. The most severe episodes of mania can involve having very distorted beliefs or perceptions about the world and seeing or hearing things that are not real (psychotic delusions and hallucinations).  °The emotional lows of bipolar disorder (depression) also can range from mild to severe. Severe episodes of bipolar depression can involve psychotic delusions and hallucinations. °Sometimes people with bipolar disorder experience a state of mixed mood. Symptoms of hypomania or mania and depression are both present during this mixed-mood episode. °SIGNS AND SYMPTOMS °There are signs and symptoms of the episodes of hypomania and mania as well as the episodes of depression. The signs and symptoms of hypomania and mania are similar but vary in severity. They include: °· Inflated self-esteem or feeling of increased self-confidence. °· Decreased need for sleep. °· Unusual talkativeness (rapid or pressured speech) or the feeling of a need to keep talking. °· Sensation of racing thoughts or constant talking, with quick shifts between topics that may or may not be related (flight of ideas). °· Decreased ability to focus or concentrate. °· Increased purposeful activity, such as work, studies,  or social activity, or nonproductive activity, such as pacing, squirming and fidgeting, or finger and toe tapping. °· Impulsive behavior and use of poor judgment, resulting in high-risk activities, such as having unprotected sex or spending excessive amounts of money. °Signs and symptoms of depression include the following:  °· Feelings of sadness, hopelessness, or helplessness. °· Frequent or uncontrollable episodes of crying. °· Lack of feeling anything or caring about anything. °· Difficulty sleeping or sleeping too much.  °· Inability to enjoy the things you used to enjoy.   °· Desire to be alone all the time.   °· Feelings of guilt or worthlessness.  °· Lack of energy or motivation.   °· Difficulty concentrating, remembering, or making decisions.  °· Change in appetite or weight beyond normal fluctuations. °· Thoughts of death or the desire to harm yourself. °DIAGNOSIS  °Bipolar disorder is diagnosed through an assessment by your caregiver. Your caregiver will ask questions about your emotional episodes. There are two main types of bipolar disorder. People with type I bipolar disorder have manic episodes with or without depressive episodes. People with type II bipolar disorder have hypomanic episodes and major depressive episodes, which are more serious than mild depression. The type of bipolar disorder you have can make an important difference in how your illness is monitored and treated. °Your caregiver may ask questions about your medical history and use of alcohol or drugs, including prescription medication. Certain medical conditions and substances also can cause emotional highs and lows that resemble bipolar disorder (secondary bipolar disorder).  °TREATMENT  °Bipolar disorder is a long-term illness. It is best controlled with continuous treatment rather than treatment only when symptoms occur. The following treatments can be prescribed for bipolar disorders: °· Medication Medication can be prescribed by  a doctor   that is an expert in treating mental disorders (psychiatrists). Medications called mood stabilizers are usually prescribed to help control the illness. Other medications are sometimes added if symptoms of mania, depression, or psychotic delusions and hallucinations occur despite the use of a mood stabilizer. °· Talk therapy Some forms of talk therapy are helpful in providing support, education, and guidance. °A combination of medication and talk therapy is best for managing the disorder over time. A procedure in which electricity is applied to your brain through your scalp (electroconvulsive therapy) is used in cases of severe mania when medication and talk therapy do not work or work too slowly. °Document Released: 04/09/2000 Document Revised: 04/28/2012 Document Reviewed: 01/28/2012 °[employer]® Patient Information ©2014 [employer]. ° °

## 2012-11-23 ENCOUNTER — Encounter: Payer: Self-pay | Admitting: Family Medicine

## 2012-11-23 NOTE — Progress Notes (Signed)
Patient ID: Joan Robinson, female   DOB: 08/17/1960, 52 y.o.   MRN: [id number] Joan Robinson [id number] 1960/05/27 11/23/2012      Progress Note-Follow Up  Subjective  Chief Complaint  Chief Complaint  Patient presents with  . Follow-up    Pt here for 6 week follow up.    HPI  Patient is a 52 yo caucasian female in today for follow up. She acknowledges sheis becoming increasingly irritable and agitated with her family. She has a strong family history of bipolar depression. Patient denies chestpain or sob. No palpitations, gi or gu c/o. Denies suicidal ideation  Past Medical History  Diagnosis Date  . Allergic rhinitis   . Adenomatous colon polyp   . Diverticulosis   . Colitis, Clostridium difficile 5/11,6/11  . Hypothyroidism   . Urinary incontinence   . PUD (peptic ulcer disease)   . Tobacco abuse   . Hypertension   . Hyperlipidemia   . Colon polyp   . Anemia 10/10/2011  . Preventative health care 10/10/2011  . Sinusitis acute 10/10/2011  . Chicken pox as a child    X 2  . Mumps as a child  . Lower back pain   . 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 Staph aureus infection 12/18/2011    Recurrent lesions in nares  . Perimenopausal 01/16/2012  . Anxiety and depression 01/17/2007    Qualifier: Diagnosis of  By: Everardo All MD, Cleophas Dunker   . 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 Right knee pain 05/10/2012  . BCC (basal cell carcinoma of skin) 06/01/2012    Leg Follows with Dr Margo Aye  . Headache(784.0) 08/17/2012  . Bipolar disorder 01/17/2007    Qualifier: Diagnosis of  By: Everardo All MD, Cleophas Dunker     Past Surgical History  Procedure Laterality Date  . Tubal ligation  1995  . Uterine suspension      mesh  . Colonoscopy    . Gated spect wall motion stress cardiolite  11/05/2001  . Polypectomy    . Abdominal hysterectomy    . Abdominal hysterectomy  2011    complete  . Dilation and curettage of uterus  1985    Family History  Problem Relation Age of Onset  . Heart disease Mother    stents  . Stroke Mother   . Hyperlipidemia Mother   . Hypertension Mother   . Other Mother     blood disorder- mgus  . Colon polyps Father   . Other Father     aorta disection  . Aneurysm Father   . Colon cancer Paternal Uncle   . Irritable bowel syndrome Daughter   . Other Daughter     gastritis  . Mental illness Daughter     bipolar and mood disorder  . Hypertension Sister     ?  Marland Kitchen Mental illness Son     bipolar  . Arthritis Sister   . Other Sister     thyroid  . Other Sister     thyroid    History   Social History  . Marital Status: Married    Spouse Name: N/A    Number of Children: 2  . Years of Education: N/A   Occupational History  . 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 Smoking status: Former Smoker -- 1.00 packs/day for 30 years    Types: Cigarettes    Quit date: 05/15/2009  . Smokeless tobacco: Never Used  . Alcohol Use: No  . Drug Use:  No  . Sexual Activity: No   Other Topics Concern  . Not on file   Social History Narrative  . No narrative on file    Current Outpatient Prescriptions on File Prior to Visit  Medication Sig Dispense Refill  . amoxicillin (AMOXIL) 875 MG tablet Take 1 tablet (875 mg total) by mouth 2 (two) times daily.  20 tablet  0  . Butalbital-Acetaminophen 50-300 MG TABS Take 1 tablet by mouth 3 (three) times daily as needed.  60 tablet  0  . calcium carbonate (TUMS E-X 750) 750 MG chewable tablet Chew 2 tablets by mouth daily.        . fexofenadine (ALLEGRA) 180 MG tablet Take 1 tablet (180 mg total) by mouth 2 (two) times daily.  60 tablet  5  . HYDROcodone-acetaminophen (NORCO) 10-325 MG per tablet Take 1 tablet by mouth every 6 (six) hours as needed for pain.  60 tablet  1  . levothyroxine (SYNTHROID, LEVOTHROID) 88 MCG tablet Take 1 tablet (88 mcg total) by mouth daily.  30 tablet  5  . Loperamide HCl (IMODIUM A-D PO) Take by mouth.      . metoprolol tartrate (LOPRESSOR) 25 MG tablet Take 0.5 tablets (12.5  mg total) by mouth daily. 1/2 tablet by mouth once daily  45 tablet  3  . montelukast (SINGULAIR) 10 MG tablet Take 1 tablet (10 mg total) by mouth daily as needed.  30 tablet  5  . nitroGLYCERIN (NITROSTAT) 0.4 MG SL tablet Place 0.4 mg under the tongue every 5 (five) minutes as needed.        Marland Kitchen omeprazole (PRILOSEC) 40 MG capsule Take 1 capsule (40 mg total) by mouth daily.  90 capsule  3  . Probiotic Product (PROBIOTIC FORMULA) CAPS Take 1 capsule by mouth daily.        . ranitidine (ZANTAC) 150 MG tablet Take 1 tablet (150 mg total) by mouth 2 (two) times daily.  180 tablet  3   No current facility-administered medications on file prior to visit.    Allergies  Allergen Reactions  . Keflex [Cephalexin] Diarrhea  . Aspirin     REACTION: difficulty breathing  . Dicyclomine Hcl     REACTION: mouth ulcers  . Metronidazole     REACTION: hives, mouth ulcers  . Moxifloxacin     REACTION: increased heart rate, nausea  . Nsaids     REACTION: difficulty breathing  . Phenergan [Promethazine Hcl]     "knocks" pt out for about 3 days  . Sulfamethoxazole-Trimethoprim     REACTION: increased heart rate, nausea    Review of Systems  Review of Systems  Constitutional: Negative for fever and malaise/fatigue.  HENT: Negative for congestion.   Eyes: Negative for discharge.  Respiratory: Negative for shortness of breath.   Cardiovascular: Negative for chest pain, palpitations and leg swelling.  Gastrointestinal: Negative for nausea, abdominal pain and diarrhea.  Genitourinary: Negative for dysuria.  Musculoskeletal: Negative for falls.  Skin: Positive for itching and rash.  Neurological: Negative for loss of consciousness and headaches.  Endo/Heme/Allergies: Negative for polydipsia.  Psychiatric/Behavioral: Positive for depression. Negative for suicidal ideas. The patient is nervous/anxious. The patient does not have insomnia.     Objective  BP 120/90  Pulse 79  Temp(Src) 98.4 F  (36.9 C) (Oral)  Resp 16  Ht 5\' 7"  (1.702 m)  Wt 197 lb 1.9 oz (89.413 kg)  BMI 30.87 kg/m2  SpO2 97%  Physical Exam  Physical Exam  Constitutional: She is oriented to person, place, and time and well-developed, well-nourished, and in no distress. No distress.  HENT:  Head: Normocephalic and atraumatic.  Eyes: Conjunctivae are normal.  Neck: Neck supple. No thyromegaly present.  Cardiovascular: Normal rate, regular rhythm and normal heart sounds.   No murmur heard. Pulmonary/Chest: Effort normal and breath sounds normal. She has no wheezes.  Abdominal: She exhibits no distension and no mass.  Musculoskeletal: She exhibits no edema.  Lymphadenopathy:    She has no cervical adenopathy.  Neurological: She is alert and oriented to person, place, and time.  Skin: Skin is warm and dry. No rash noted. She is not diaphoretic.  Psychiatric: Memory, affect and judgment normal.    Lab Results  Component Value Date   TSH 1.06 07/01/2012   Lab Results  Component Value Date   WBC 4.4* 07/01/2012   HGB 11.8* 07/01/2012   HCT 35.0* 07/01/2012   MCV 89.0 07/01/2012   PLT 214.0 07/01/2012   Lab Results  Component Value Date   CREATININE 1.1 07/01/2012   BUN 9 07/01/2012   NA 143 07/01/2012   K 4.3 07/01/2012   CL 109 07/01/2012   CO2 28 07/01/2012   Lab Results  Component Value Date   ALT 17 07/01/2012   AST 19 07/01/2012   ALKPHOS 93 07/01/2012   BILITOT 0.6 07/01/2012   Lab Results  Component Value Date   CHOL 175 07/01/2012   Lab Results  Component Value Date   HDL 34.80* 07/01/2012   No results found for this basename: LDLCALC   Lab Results  Component Value Date   TRIG 215.0* 07/01/2012   Lab Results  Component Value Date   CHOLHDL 5 07/01/2012     Assessment & Plan  HYPERTENSION Well controlled, no changes  Bipolar disorder Is started on Seroquel today and titrated up to 300 mg, cut Lexapro to 10 mg  ADD (attention deficit disorder) Will try Adderall XR 20 mg daily  once more while we titrate up her Seroquel

## 2012-11-23 NOTE — Assessment & Plan Note (Signed)
Will try Adderall XR 20 mg daily once more while we titrate up her Seroquel

## 2012-11-23 NOTE — Assessment & Plan Note (Signed)
Well controlled, no changes 

## 2012-11-23 NOTE — Assessment & Plan Note (Addendum)
Is started on Seroquel today and titrated up to 300 mg, cut Lexapro to 10 mg

## 2012-11-24 ENCOUNTER — Telehealth: Payer: Self-pay | Admitting: *Deleted

## 2012-11-24 NOTE — Telephone Encounter (Signed)
We could switch to Zyprexa or if she titrates up the somnolence usually goes away by the time she gets to 200 mg dose on day 3

## 2012-11-24 NOTE — Telephone Encounter (Signed)
Patient states she was in office on Friday and was px new Rx, reports that after taking she slept for 36 hours straight, and inquires about what she should do going forward RE: this matter/SLS Please Advise.

## 2012-11-25 NOTE — Telephone Encounter (Signed)
Zyprexa 2.5 mg po qhs, #30

## 2012-11-25 NOTE — Telephone Encounter (Signed)
Notified pt and she is willing to try zyprexa.  Please advise dose/directions?

## 2012-11-26 MED ORDER — OLANZAPINE 2.5 MG PO TABS: 2.5000 mg | ORAL_TABLET | Freq: Every day | ORAL | Status: DC

## 2012-11-26 NOTE — Telephone Encounter (Signed)
Rx sent to pharmacy   

## 2012-11-27 ENCOUNTER — Encounter (INDEPENDENT_AMBULATORY_CARE_PROVIDER_SITE_OTHER): Payer: Managed Care, Other (non HMO) | Admitting: Ophthalmology

## 2012-11-27 ENCOUNTER — Telehealth: Payer: Self-pay | Admitting: Family Medicine

## 2012-11-27 DIAGNOSIS — H35039 Hypertensive retinopathy, unspecified eye: Secondary | ICD-10-CM

## 2012-11-27 DIAGNOSIS — H2510 Age-related nuclear cataract, unspecified eye: Secondary | ICD-10-CM

## 2012-11-27 DIAGNOSIS — H35379 Puckering of macula, unspecified eye: Secondary | ICD-10-CM

## 2012-11-27 DIAGNOSIS — H43819 Vitreous degeneration, unspecified eye: Secondary | ICD-10-CM

## 2012-11-27 DIAGNOSIS — I10 Essential (primary) hypertension: Secondary | ICD-10-CM

## 2012-11-27 NOTE — Telephone Encounter (Signed)
Received medical records from [facility]  P: [phone] F: [phone]

## 2012-12-10 ENCOUNTER — Telehealth: Payer: Self-pay | Admitting: Family Medicine

## 2012-12-10 NOTE — Telephone Encounter (Signed)
Patient states that everyone in her family has been diagnosed with strep and now her throat is sore. She would like something called into the pharmacy.

## 2012-12-10 NOTE — Telephone Encounter (Signed)
Can have Azithromycin, 250 mg tabs 2 tabs po once then 1 tab po daily x 4 days, disp #6

## 2012-12-12 MED ORDER — AZITHROMYCIN 250 MG PO TABS: ORAL_TABLET | ORAL | Status: DC

## 2012-12-12 NOTE — Telephone Encounter (Signed)
Rx request to pharmacy; pt informed/SLS  

## 2012-12-19 ENCOUNTER — Encounter: Payer: Self-pay | Admitting: Family Medicine

## 2012-12-19 ENCOUNTER — Ambulatory Visit (INDEPENDENT_AMBULATORY_CARE_PROVIDER_SITE_OTHER): Payer: Managed Care, Other (non HMO) | Admitting: Family Medicine

## 2012-12-19 VITALS — BP 122/92 | HR 71 | Temp 98.1°F | Ht 67.0 in | Wt 200.1 lb

## 2012-12-19 DIAGNOSIS — F988 Other specified behavioral and emotional disorders with onset usually occurring in childhood and adolescence: Secondary | ICD-10-CM

## 2012-12-19 DIAGNOSIS — F319 Bipolar disorder, unspecified: Secondary | ICD-10-CM

## 2012-12-19 DIAGNOSIS — R202 Paresthesia of skin: Secondary | ICD-10-CM

## 2012-12-19 DIAGNOSIS — I10 Essential (primary) hypertension: Secondary | ICD-10-CM

## 2012-12-19 DIAGNOSIS — R209 Unspecified disturbances of skin sensation: Secondary | ICD-10-CM

## 2012-12-19 DIAGNOSIS — J209 Acute bronchitis, unspecified: Secondary | ICD-10-CM

## 2012-12-19 LAB — RENAL FUNCTION PANEL
Calcium: 9.4 mg/dL (ref 8.4–10.5)
Chloride: 102 mEq/L (ref 96–112)
Phosphorus: 3.7 mg/dL (ref 2.3–4.6)
Potassium: 4.1 mEq/L (ref 3.5–5.3)
Sodium: 138 mEq/L (ref 135–145)

## 2012-12-19 LAB — CBC
MCH: 29.8 pg (ref 26.0–34.0)
MCV: 87.1 fL (ref 78.0–100.0)
Platelets: 221 10*3/uL (ref 150–400)
RBC: 4.03 MIL/uL (ref 3.87–5.11)
RDW: 13.7 % (ref 11.5–15.5)
WBC: 5.5 10*3/uL (ref 4.0–10.5)

## 2012-12-19 LAB — SEDIMENTATION RATE: Sed Rate: 27 mm/hr — ABNORMAL HIGH (ref 0–22)

## 2012-12-19 LAB — MAGNESIUM: Magnesium: 2.2 mg/dL (ref 1.5–2.5)

## 2012-12-19 MED ORDER — ALBUTEROL SULFATE HFA 108 (90 BASE) MCG/ACT IN AERS: 2.0000 | INHALATION_SPRAY | Freq: Four times a day (QID) | RESPIRATORY_TRACT | Status: DC | PRN

## 2012-12-19 MED ORDER — HYDROCODONE-HOMATROPINE 5-1.5 MG/5ML PO SYRP: 5.0000 mL | ORAL_SOLUTION | Freq: Two times a day (BID) | ORAL | Status: DC | PRN

## 2012-12-19 MED ORDER — AMPHETAMINE-DEXTROAMPHET ER 30 MG PO CP24: 30.0000 mg | ORAL_CAPSULE | ORAL | Status: DC

## 2012-12-19 NOTE — Progress Notes (Signed)
Pre visit review using our clinic review tool, if applicable. No additional management support is needed unless otherwise documented below in the visit note. 

## 2012-12-19 NOTE — Progress Notes (Signed)
Patient ID: Joan Robinson, female   DOB: October 07, 1960, 52 y.o.   MRN: [id number] Joan Robinson [id number] 1960/07/21 12/19/2012      Progress Note-Follow Up  Subjective  Chief Complaint  Chief Complaint  Patient presents with  . Follow-up    3 week    HPI  Patient is a 52 year old female who is in today for followup. She never proceeded with her increased medication, adding Zyprexa secondary to feeling physically poor since then. She acknowledges her depression and anxiety are stable on her Lexapro but she does still acknowledge increased agitation. We'll proceed with Zyprexa later but right now is having diarrhea and is now having fevers and chills. Also describes malaise and myalgias or shortness of breath and coughs. She's had a sore throat as well as chest congestion. Notes cough is worse at night and productive of green phlegm. No GI complaints at this time but did have some diarrhea last week.  Past Medical History  Diagnosis Date  . Allergic rhinitis   . Adenomatous colon polyp   . Diverticulosis   . Colitis, Clostridium difficile 5/11,6/11  . Hypothyroidism   . Urinary incontinence   . PUD (peptic ulcer disease)   . Tobacco abuse   . Hypertension   . Hyperlipidemia   . Colon polyp   . Anemia 10/10/2011  . Preventative health care 10/10/2011  . Sinusitis acute 10/10/2011  . Chicken pox as a child    X 2  . Mumps as a child  . Lower back pain   . 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 Staph aureus infection 12/18/2011    Recurrent lesions in nares  . Perimenopausal 01/16/2012  . Anxiety and depression 01/17/2007    Qualifier: Diagnosis of  By: Everardo All MD, Cleophas Dunker   . 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 Right knee pain 05/10/2012  . BCC (basal cell carcinoma of skin) 06/01/2012    Leg Follows with Dr Margo Aye  . Headache(784.0) 08/17/2012  . Bipolar disorder 01/17/2007    Qualifier: Diagnosis of  By: Everardo All MD, Cleophas Dunker     Past Surgical History  Procedure Laterality Date  . Tubal  ligation  1995  . Uterine suspension      mesh  . Colonoscopy    . Gated spect wall motion stress cardiolite  11/05/2001  . Polypectomy    . Abdominal hysterectomy    . Abdominal hysterectomy  2011    complete  . Dilation and curettage of uterus  1985    Family History  Problem Relation Age of Onset  . Heart disease Mother     stents  . Stroke Mother   . Hyperlipidemia Mother   . Hypertension Mother   . Other Mother     blood disorder- mgus  . Colon polyps Father   . Other Father     aorta disection  . Aneurysm Father   . Colon cancer Paternal Uncle   . Irritable bowel syndrome Daughter   . Other Daughter     gastritis  . Mental illness Daughter     bipolar and mood disorder  . Hypertension Sister     ?  Marland Kitchen Mental illness Son     bipolar  . Arthritis Sister   . Other Sister     thyroid  . Other Sister     thyroid    History   Social History  . Marital Status: Married    Spouse Name: N/A    Number of Children: 2  .  Years of Education: N/A   Occupational History  . 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 Smoking status: Former Smoker -- 1.00 packs/day for 30 years    Types: Cigarettes    Quit date: 05/15/2009  . Smokeless tobacco: Never Used  . Alcohol Use: No  . Drug Use: No  . Sexual Activity: No   Other Topics Concern  . Not on file   Social History Narrative  . No narrative on file    Current Outpatient Prescriptions on File Prior to Visit  Medication Sig Dispense Refill  . ALPRAZolam (XANAX) 0.25 MG tablet Take 1 tablet (0.25 mg total) by mouth 3 (three) times daily as needed for sleep or anxiety.  30 tablet  1  . amphetamine-dextroamphetamine (ADDERALL XR) 20 MG 24 hr capsule Take 1 capsule (20 mg total) by mouth every morning.  30 capsule  0  . calcium carbonate (TUMS E-X 750) 750 MG chewable tablet Chew 2 tablets by mouth daily.        Marland Kitchen escitalopram (LEXAPRO) 20 MG tablet Take 0.5 tablets (10 mg total) by mouth daily.  30  tablet  6  . fexofenadine (ALLEGRA) 180 MG tablet Take 1 tablet (180 mg total) by mouth 2 (two) times daily.  60 tablet  5  . HYDROcodone-acetaminophen (NORCO) 10-325 MG per tablet Take 1 tablet by mouth every 6 (six) hours as needed for pain.  60 tablet  1  . levothyroxine (SYNTHROID, LEVOTHROID) 88 MCG tablet Take 1 tablet (88 mcg total) by mouth daily.  30 tablet  5  . Loperamide HCl (IMODIUM A-D PO) Take by mouth.      . metoprolol tartrate (LOPRESSOR) 25 MG tablet Take 0.5 tablets (12.5 mg total) by mouth daily. 1/2 tablet by mouth once daily  45 tablet  3  . nitroGLYCERIN (NITROSTAT) 0.4 MG SL tablet Place 0.4 mg under the tongue every 5 (five) minutes as needed.        Marland Kitchen omeprazole (PRILOSEC) 40 MG capsule Take 1 capsule (40 mg total) by mouth daily.  90 capsule  3  . ondansetron (ZOFRAN) 4 MG tablet Take 1 tablet (4 mg total) by mouth every 6 (six) hours as needed.  20 tablet  5  . Probiotic Product (PROBIOTIC FORMULA) CAPS Take 1 capsule by mouth daily.        . ranitidine (ZANTAC) 150 MG tablet Take 1 tablet (150 mg total) by mouth 2 (two) times daily.  180 tablet  3  . OLANZapine (ZYPREXA) 2.5 MG tablet Take 1 tablet (2.5 mg total) by mouth at bedtime.  30 tablet  0   No current facility-administered medications on file prior to visit.    Allergies  Allergen Reactions  . Keflex [Cephalexin] Diarrhea  . Aspirin     REACTION: difficulty breathing  . Dicyclomine Hcl     REACTION: mouth ulcers  . Metronidazole     REACTION: hives, mouth ulcers  . Moxifloxacin     REACTION: increased heart rate, nausea  . Nsaids     REACTION: difficulty breathing  . Phenergan [Promethazine Hcl]     "knocks" pt out for about 3 days  . Quetiapine     Somnolence. Slept for 36 hours straight.  . Sulfamethoxazole-Trimethoprim     REACTION: increased heart rate, nausea    Review of Systems  Review of Systems  Constitutional: Negative for fever, chills and malaise/fatigue.  HENT: Negative for  congestion, hearing loss and nosebleeds.  Eyes: Negative for discharge.  Respiratory: Negative for cough, sputum production, shortness of breath and wheezing.   Cardiovascular: Negative for chest pain, palpitations and leg swelling.  Gastrointestinal: Negative for heartburn, nausea, vomiting, abdominal pain, diarrhea, constipation and blood in stool.  Genitourinary: Negative for dysuria, urgency, frequency and hematuria.  Musculoskeletal: Negative for back pain, falls and myalgias.  Skin: Negative for rash.  Neurological: Negative for dizziness, tremors, sensory change, focal weakness, loss of consciousness, weakness and headaches.  Endo/Heme/Allergies: Negative for polydipsia. Does not bruise/bleed easily.  Psychiatric/Behavioral: Negative for depression and suicidal ideas. The patient is not nervous/anxious and does not have insomnia.     Objective  BP 122/92  Pulse 71  Temp(Src) 98.1 F (36.7 C) (Oral)  Ht 5\' 7"  (1.702 m)  Wt 200 lb 1.3 oz (90.756 kg)  BMI 31.33 kg/m2  SpO2 97%  Physical Exam  Physical Exam  Constitutional: She is oriented to person, place, and time and well-developed, well-nourished, and in no distress. No distress.  HENT:  Head: Normocephalic and atraumatic.  Eyes: Conjunctivae are normal.  Neck: Neck supple. No thyromegaly present.  Cardiovascular: Normal rate, regular rhythm and normal heart sounds.   No murmur heard. Pulmonary/Chest: Effort normal and breath sounds normal. She has no wheezes.  Abdominal: She exhibits no distension and no mass.  Musculoskeletal: She exhibits no edema.  Lymphadenopathy:    She has no cervical adenopathy.  Neurological: She is alert and oriented to person, place, and time.  Skin: Skin is warm and dry. No rash noted. She is not diaphoretic.  Psychiatric: Memory, affect and judgment normal.    Lab Results  Component Value Date   TSH 1.06 07/01/2012   Lab Results  Component Value Date   WBC 4.4* 07/01/2012   HGB  11.8* 07/01/2012   HCT 35.0* 07/01/2012   MCV 89.0 07/01/2012   PLT 214.0 07/01/2012   Lab Results  Component Value Date   CREATININE 1.1 07/01/2012   BUN 9 07/01/2012   NA 143 07/01/2012   K 4.3 07/01/2012   CL 109 07/01/2012   CO2 28 07/01/2012   Lab Results  Component Value Date   ALT 17 07/01/2012   AST 19 07/01/2012   ALKPHOS 93 07/01/2012   BILITOT 0.6 07/01/2012   Lab Results  Component Value Date   CHOL 175 07/01/2012   Lab Results  Component Value Date   HDL 34.80* 07/01/2012   No results found for this basename: LDLCALC   Lab Results  Component Value Date   TRIG 215.0* 07/01/2012   Lab Results  Component Value Date   CHOLHDL 5 07/01/2012     Assessment & Plan  HYPERTENSION Well controlled, no changes to meds today  Acute bronchitis Start probiotics, mucinex, zinc if no improvement will proceed with antibiotics, increase and hydration.  Paresthesia Patient c/o altered sensation hands/fett, labs unremarkable today. Will further investigate if persists.  Bipolar disorder Chose not to proceed with Zyprexa yet due to not feeling well, still agrees to proceed when she feels better.

## 2012-12-19 NOTE — Patient Instructions (Signed)

## 2012-12-22 ENCOUNTER — Encounter: Payer: Self-pay | Admitting: Family Medicine

## 2012-12-22 DIAGNOSIS — R202 Paresthesia of skin: Secondary | ICD-10-CM | POA: Insufficient documentation

## 2012-12-22 DIAGNOSIS — J209 Acute bronchitis, unspecified: Secondary | ICD-10-CM | POA: Insufficient documentation

## 2012-12-22 NOTE — Assessment & Plan Note (Signed)
Well controlled, no changes to meds today

## 2012-12-22 NOTE — Assessment & Plan Note (Signed)
Chose not to proceed with Zyprexa yet due to not feeling well, still agrees to proceed when she feels better.

## 2012-12-22 NOTE — Assessment & Plan Note (Signed)
Patient c/o altered sensation hands/fett, labs unremarkable today. Will further investigate if persists.

## 2012-12-22 NOTE — Assessment & Plan Note (Signed)
Start probiotics, mucinex, zinc if no improvement will proceed with antibiotics, increase and hydration.

## 2012-12-25 ENCOUNTER — Encounter (INDEPENDENT_AMBULATORY_CARE_PROVIDER_SITE_OTHER): Payer: Managed Care, Other (non HMO) | Admitting: Ophthalmology

## 2012-12-25 DIAGNOSIS — H35379 Puckering of macula, unspecified eye: Secondary | ICD-10-CM

## 2012-12-25 DIAGNOSIS — I10 Essential (primary) hypertension: Secondary | ICD-10-CM

## 2012-12-25 DIAGNOSIS — H43819 Vitreous degeneration, unspecified eye: Secondary | ICD-10-CM

## 2012-12-25 DIAGNOSIS — H2510 Age-related nuclear cataract, unspecified eye: Secondary | ICD-10-CM

## 2012-12-25 DIAGNOSIS — H35039 Hypertensive retinopathy, unspecified eye: Secondary | ICD-10-CM

## 2013-01-13 ENCOUNTER — Telehealth: Payer: Self-pay | Admitting: Family Medicine

## 2013-01-13 DIAGNOSIS — F329 Major depressive disorder, single episode, unspecified: Secondary | ICD-10-CM

## 2013-01-13 MED ORDER — ESCITALOPRAM OXALATE 20 MG PO TABS: 10.0000 mg | ORAL_TABLET | Freq: Every day | ORAL | Status: DC

## 2013-01-13 NOTE — Telephone Encounter (Signed)
refill- escitalopram 20 mg tab. Take one tablet by mouth daily. Qty 30 last fill 11.11.14

## 2013-01-23 ENCOUNTER — Encounter: Payer: Self-pay | Admitting: Family Medicine

## 2013-01-23 ENCOUNTER — Ambulatory Visit (INDEPENDENT_AMBULATORY_CARE_PROVIDER_SITE_OTHER): Payer: Managed Care, Other (non HMO) | Admitting: Family Medicine

## 2013-01-23 VITALS — BP 122/82 | HR 62 | Temp 97.9°F | Ht 67.0 in | Wt 204.0 lb

## 2013-01-23 DIAGNOSIS — F988 Other specified behavioral and emotional disorders with onset usually occurring in childhood and adolescence: Secondary | ICD-10-CM

## 2013-01-23 DIAGNOSIS — F341 Dysthymic disorder: Secondary | ICD-10-CM

## 2013-01-23 DIAGNOSIS — J3489 Other specified disorders of nose and nasal sinuses: Secondary | ICD-10-CM

## 2013-01-23 DIAGNOSIS — E039 Hypothyroidism, unspecified: Secondary | ICD-10-CM

## 2013-01-23 DIAGNOSIS — E785 Hyperlipidemia, unspecified: Secondary | ICD-10-CM

## 2013-01-23 DIAGNOSIS — L509 Urticaria, unspecified: Secondary | ICD-10-CM

## 2013-01-23 DIAGNOSIS — Z23 Encounter for immunization: Secondary | ICD-10-CM

## 2013-01-23 DIAGNOSIS — I10 Essential (primary) hypertension: Secondary | ICD-10-CM

## 2013-01-23 DIAGNOSIS — F418 Other specified anxiety disorders: Secondary | ICD-10-CM

## 2013-01-23 DIAGNOSIS — L508 Other urticaria: Secondary | ICD-10-CM

## 2013-01-23 DIAGNOSIS — F319 Bipolar disorder, unspecified: Secondary | ICD-10-CM

## 2013-01-23 MED ORDER — METHYLPREDNISOLONE (PAK) 4 MG PO TABS
ORAL_TABLET | ORAL | Status: DC
Start: 2013-01-23 — End: 2013-04-30

## 2013-01-23 MED ORDER — OLANZAPINE 2.5 MG PO TABS: 2.5000 mg | ORAL_TABLET | Freq: Every day | ORAL | Status: DC

## 2013-01-23 MED ORDER — MUPIROCIN 2 % EX OINT: 1.0000 "application " | TOPICAL_OINTMENT | Freq: Two times a day (BID) | CUTANEOUS | Status: DC

## 2013-01-23 NOTE — Patient Instructions (Signed)
belviq is new med for weight loss  Basic Carbohydrate Counting Basic carbohydrate counting is a way to plan meals. It is done by counting the amount of carbohydrate in foods. Foods that have carbohydrates are starches (grains, beans, starchy vegetables) and sweets. Eating carbohydrates increases blood glucose (sugar) levels. People with diabetes use carbohydrate counting to help keep their blood glucose at a normal level.  COUNTING CARBOHYDRATES IN FOODS The first step in counting carbohydrates is to learn how many carbohydrate servings you should have in every meal. A dietitian can plan this for you. After learning the amount of carbohydrates to include in your meal plan, you can start to choose the carbohydrate-containing foods you want to eat.  There are 2 ways to identify the amount of carbohydrates in the foods you eat.  Read the Nutrition Facts panel on food labels. You need 2 pieces of information from the Nutrition Facts panel to count carbohydrates this way:  Serving size.  Total carbohydrate (in grams). Decide how many servings you will be eating. If it is 1 serving, you will be eating the amount of carbohydrate listed on the panel. If you will be eating 2 servings, you will be eating double the amount of carbohydrate listed on the panel.   Learn serving sizes. A serving size of most carbohydrate-containing foods is about 15 grams (g). Listed below are single serving sizes of common carbohydrate-containing foods:  1 slice bread.   cup unsweetened, dry cereal.   cup hot cereal.   cup rice.   cup mashed potatoes.   cup pasta.  1 cup fresh fruit.   cup canned fruit.  1 cup milk (whole, 2%, or skim).   cup starchy vegetables (peas, corn, or potatoes). Counting carbohydrates this way is similar to looking on the Nutrition Facts panel. Decide how many servings you will eat first. Multiply the number of servings you eat by 15 g. For example, if you have 2 cups of  strawberries, you had 2 servings. That means you had 30 g of carbohydrate (2 servings x 15 g = 30 g). CALCULATING CARBOHYDRATES IN A MEAL Sample dinner  3 oz chicken breast.   cup brown rice.   cup corn.  1 cup fat-free milk.  1 cup strawberries with sugar-free whipped topping. Carbohydrate calculation First, identify the foods that contain carbohydrate:  Rice.  Corn.  Milk.  Strawberries. Calculate the number of servings eaten:  2 servings rice.  1 serving corn.  1 serving milk.  1 serving strawberries. Multiply the number of servings by 15 g:  2 servings rice x 15 g = 30 g.  1 serving corn x 15 g = 15 g.  1 serving milk x 15 g = 15 g.  1 serving strawberries x 15 g = 15 g. Add the amounts to find the total carbohydrates eaten: 30 g + 15 g + 15 g + 15 g = 75 g carbohydrate eaten at dinner. Document Released: 01/01/2005 Document Revised: 03/26/2011 Document Reviewed: 11/17/2010 [facility] Patient Information 2014 [place], [place].

## 2013-01-23 NOTE — Progress Notes (Signed)
Pre visit review using our clinic review tool, if applicable. No additional management support is needed unless otherwise documented below in the visit note. 

## 2013-01-25 ENCOUNTER — Encounter: Payer: Self-pay | Admitting: Family Medicine

## 2013-01-25 NOTE — Assessment & Plan Note (Signed)
Stop Adderall. Will hold off on new meds for this for now.

## 2013-01-25 NOTE — Assessment & Plan Note (Signed)
Flared recently had a medrol dosepak which she tolerated better than Prednisone. Given refill and should return to allergist if worsens.

## 2013-01-25 NOTE — Progress Notes (Signed)
Patient ID: Joan Robinson, female   DOB: 09/12/1960, 53 y.o.   MRN: [id number] Joan Robinson [id number] Jan 04, 1961 01/25/2013      Progress Note-Follow Up  Subjective  Chief Complaint  Chief Complaint  Patient presents with  . Follow-up    5 week   . Injections    prevnar    HPI  Patient is a 53 year old Caucasian female who is in today for followup. She continues to struggle with urticaria. He had been flared up and and is now improving since she started Medrol Dosepak. She is tolerated that better than she tolerated prednisone. She denies any other acute illness but does continue to struggle with ongoing stressors. Did not start the Zyprexa as recommended. Adderall has not been tolerated and she stopped it. Increased her irritability. No chest pain or palpitations. No recent illness, fevers chills or GI complaints  Past Medical History  Diagnosis Date  . Allergic rhinitis   . Adenomatous colon polyp   . Diverticulosis   . Colitis, Clostridium difficile 5/11,6/11  . Hypothyroidism   . Urinary incontinence   . PUD (peptic ulcer disease)   . Tobacco abuse   . Hypertension   . Hyperlipidemia   . Colon polyp   . Anemia 10/10/2011  . Preventative health care 10/10/2011  . Sinusitis acute 10/10/2011  . Chicken pox as a child    X 2  . Mumps as a child  . Lower back pain   . 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 Staph aureus infection 12/18/2011    Recurrent lesions in nares  . Perimenopausal 01/16/2012  . Anxiety and depression 01/17/2007    Qualifier: Diagnosis of  By: Everardo All MD, Cleophas Dunker   . 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 Right knee pain 05/10/2012  . BCC (basal cell carcinoma of skin) 06/01/2012    Leg Follows with Dr Margo Aye  . Headache(784.0) 08/17/2012  . Bipolar disorder 01/17/2007    Qualifier: Diagnosis of  By: Everardo All MD, Cleophas Dunker     Past Surgical History  Procedure Laterality Date  . Tubal ligation  1995  . Uterine suspension      mesh  . Colonoscopy    . Gated spect  wall motion stress cardiolite  11/05/2001  . Polypectomy    . Abdominal hysterectomy    . Abdominal hysterectomy  2011    complete  . Dilation and curettage of uterus  1985    Family History  Problem Relation Age of Onset  . Heart disease Mother     stents  . Stroke Mother   . Hyperlipidemia Mother   . Hypertension Mother   . Other Mother     blood disorder- mgus  . Colon polyps Father   . Other Father     aorta disection  . Aneurysm Father   . Colon cancer Paternal Uncle   . Irritable bowel syndrome Daughter   . Other Daughter     gastritis  . Mental illness Daughter     bipolar and mood disorder  . Hypertension Sister     ?  Marland Kitchen Mental illness Son     bipolar  . Arthritis Sister   . Other Sister     thyroid  . Other Sister     thyroid    History   Social History  . Marital Status: Married    Spouse Name: N/A    Number of Children: 2  . Years of Education: N/A   Occupational History  .  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 Smoking status: Former Smoker -- 1.00 packs/day for 30 years    Types: Cigarettes    Quit date: 05/15/2009  . Smokeless tobacco: Never Used  . Alcohol Use: No  . Drug Use: No  . Sexual Activity: No   Other Topics Concern  . Not on file   Social History Narrative  . No narrative on file    Current Outpatient Prescriptions on File Prior to Visit  Medication Sig Dispense Refill  . albuterol (PROVENTIL HFA;VENTOLIN HFA) 108 (90 BASE) MCG/ACT inhaler Inhale 2 puffs into the lungs every 6 (six) hours as needed for wheezing or shortness of breath.  1 Inhaler  0  . ALPRAZolam (XANAX) 0.25 MG tablet Take 1 tablet (0.25 mg total) by mouth 3 (three) times daily as needed for sleep or anxiety.  30 tablet  1  . calcium carbonate (TUMS E-X 750) 750 MG chewable tablet Chew 2 tablets by mouth daily.        Marland Kitchen escitalopram (LEXAPRO) 20 MG tablet Take 0.5 tablets (10 mg total) by mouth daily.  30 tablet  3  . fexofenadine (ALLEGRA)  180 MG tablet Take 1 tablet (180 mg total) by mouth 2 (two) times daily.  60 tablet  5  . HYDROcodone-acetaminophen (NORCO) 10-325 MG per tablet Take 1 tablet by mouth every 6 (six) hours as needed for pain.  60 tablet  1  . levothyroxine (SYNTHROID, LEVOTHROID) 88 MCG tablet Take 1 tablet (88 mcg total) by mouth daily.  30 tablet  5  . Loperamide HCl (IMODIUM A-D PO) Take by mouth.      . metoprolol tartrate (LOPRESSOR) 25 MG tablet Take 0.5 tablets (12.5 mg total) by mouth daily. 1/2 tablet by mouth once daily  45 tablet  3  . nitroGLYCERIN (NITROSTAT) 0.4 MG SL tablet Place 0.4 mg under the tongue every 5 (five) minutes as needed.        Marland Kitchen omeprazole (PRILOSEC) 40 MG capsule Take 1 capsule (40 mg total) by mouth daily.  90 capsule  3  . ondansetron (ZOFRAN) 4 MG tablet Take 1 tablet (4 mg total) by mouth every 6 (six) hours as needed.  20 tablet  5  . Probiotic Product (PROBIOTIC FORMULA) CAPS Take 1 capsule by mouth daily.        . ranitidine (ZANTAC) 150 MG tablet Take 1 tablet (150 mg total) by mouth 2 (two) times daily.  180 tablet  3   No current facility-administered medications on file prior to visit.    Allergies  Allergen Reactions  . Keflex [Cephalexin] Diarrhea  . Aspirin     REACTION: difficulty breathing  . Dicyclomine Hcl     REACTION: mouth ulcers  . Metronidazole     REACTION: hives, mouth ulcers  . Moxifloxacin     REACTION: increased heart rate, nausea  . Nsaids     REACTION: difficulty breathing  . Phenergan [Promethazine Hcl]     "knocks" pt out for about 3 days  . Quetiapine     Somnolence. Slept for 36 hours straight.  . Sulfamethoxazole-Trimethoprim     REACTION: increased heart rate, nausea    Review of Systems  Review of Systems  Constitutional: Negative for fever and malaise/fatigue.  HENT: Negative for congestion.   Eyes: Negative for discharge.  Respiratory: Negative for shortness of breath.   Cardiovascular: Negative for chest pain,  palpitations and leg swelling.  Gastrointestinal: Negative for nausea, abdominal  pain and diarrhea.  Genitourinary: Negative for dysuria.  Musculoskeletal: Negative for falls.  Skin: Positive for itching and rash.  Neurological: Negative for loss of consciousness and headaches.  Endo/Heme/Allergies: Negative for polydipsia.  Psychiatric/Behavioral: Positive for depression. Negative for suicidal ideas. The patient is nervous/anxious. The patient does not have insomnia.     Objective  BP 122/82  Pulse 62  Temp(Src) 97.9 F (36.6 C) (Oral)  Ht 5\' 7"  (1.702 m)  Wt 204 lb (92.534 kg)  BMI 31.94 kg/m2  SpO2 98%  Physical Exam  Physical Exam  Constitutional: She is oriented to person, place, and time and well-developed, well-nourished, and in no distress. No distress.  HENT:  Head: Normocephalic and atraumatic.  Eyes: Conjunctivae are normal.  Neck: Neck supple. No thyromegaly present.  Cardiovascular: Normal rate, regular rhythm and normal heart sounds.   No murmur heard. Pulmonary/Chest: Effort normal and breath sounds normal. She has no wheezes.  Abdominal: She exhibits no distension and no mass.  Musculoskeletal: She exhibits no edema.  Lymphadenopathy:    She has no cervical adenopathy.  Neurological: She is alert and oriented to person, place, and time.  Skin: Skin is warm and dry. No rash noted. She is not diaphoretic.  Psychiatric: Memory, affect and judgment normal.    Lab Results  Component Value Date   TSH 1.06 07/01/2012   Lab Results  Component Value Date   WBC 5.5 12/19/2012   HGB 12.0 12/19/2012   HCT 35.1* 12/19/2012   MCV 87.1 12/19/2012   PLT 221 12/19/2012   Lab Results  Component Value Date   CREATININE 0.87 12/19/2012   BUN 8 12/19/2012   NA 138 12/19/2012   K 4.1 12/19/2012   CL 102 12/19/2012   CO2 26 12/19/2012   Lab Results  Component Value Date   ALT 17 07/01/2012   AST 19 07/01/2012   ALKPHOS 93 07/01/2012   BILITOT 0.6 07/01/2012   Lab Results   Component Value Date   CHOL 175 07/01/2012   Lab Results  Component Value Date   HDL 34.80* 07/01/2012   No results found for this basename: [facility]   Lab Results  Component Value Date   TRIG 215.0* 07/01/2012   Lab Results  Component Value Date   CHOLHDL 5 07/01/2012     Assessment & Plan  HYPERTENSION welll controlled no changes.  ADD (attention deficit disorder) Stop Adderall. Will hold off on new meds for this for now.  Bipolar disorder Tolerating Lexapro low dose but has not started Zyprexa yet, agrees to try .  Urticaria, chronic Flared recently had a medrol dosepak which she tolerated better than Prednisone. Given refill and should return to allergist if worsens.   HYPOTHYROIDISM Well managed on current meds. No changes.

## 2013-01-25 NOTE — Assessment & Plan Note (Signed)
Well managed on current meds. No changes.

## 2013-01-25 NOTE — Assessment & Plan Note (Signed)
welll controlled no changes.

## 2013-01-25 NOTE — Assessment & Plan Note (Signed)
Tolerating Lexapro low dose but has not started Zyprexa yet, agrees to try .

## 2013-01-27 ENCOUNTER — Telehealth: Payer: Self-pay | Admitting: Family Medicine

## 2013-01-27 NOTE — Telephone Encounter (Signed)
Please advise 

## 2013-01-27 NOTE — Telephone Encounter (Signed)
Pt arms are throbbing, painful through both arms, hands and middle of back. Not feeling well. Ongoing since Sunday evening. Could this be reaction to pneumonia injection? Please advise.

## 2013-01-27 NOTE — Telephone Encounter (Signed)
Unlikely the pneumonia shot but cannot tell for sure. Try taking Tylenol 650 mg tid x 5 days then come in if not getting any better.

## 2013-01-28 NOTE — Telephone Encounter (Signed)
Notified pt and she voices understanding states she started taking Tylenol yesterday and will call for appt if not better in the next 1-2 days.

## 2013-02-10 ENCOUNTER — Telehealth: Payer: Self-pay | Admitting: Family Medicine

## 2013-02-10 ENCOUNTER — Other Ambulatory Visit: Payer: Self-pay

## 2013-02-10 DIAGNOSIS — Z1231 Encounter for screening mammogram for malignant neoplasm of breast: Secondary | ICD-10-CM

## 2013-02-10 NOTE — Telephone Encounter (Signed)
Spoke with Dr Charlett Blake about weight loss meds, would like to go ahead with something. Concerned about BP. Pt uses [facility]. Please advise.

## 2013-02-11 NOTE — Telephone Encounter (Signed)
Ok to Rx Belviq 10mg  po bid disp #60 with 2 rf and needs appt in 4-6 weeks to assess response. She should check on line for coupons it is expensive.

## 2013-02-16 MED ORDER — LORCASERIN HCL 10 MG PO TABS: 1.0000 | ORAL_TABLET | Freq: Two times a day (BID) | ORAL | Status: DC

## 2013-02-16 NOTE — Telephone Encounter (Signed)
Rx faxed

## 2013-02-23 ENCOUNTER — Telehealth: Payer: Self-pay | Admitting: Family Medicine

## 2013-02-23 DIAGNOSIS — F418 Other specified anxiety disorders: Secondary | ICD-10-CM

## 2013-02-23 MED ORDER — OLANZAPINE 2.5 MG PO TABS: 2.5000 mg | ORAL_TABLET | Freq: Every day | ORAL | Status: DC

## 2013-02-23 NOTE — Telephone Encounter (Signed)
Refill olanzapine

## 2013-02-27 ENCOUNTER — Other Ambulatory Visit: Payer: Self-pay

## 2013-02-27 ENCOUNTER — Telehealth: Payer: Self-pay | Admitting: Family Medicine

## 2013-02-27 ENCOUNTER — Ambulatory Visit
Admission: RE | Admit: 2013-02-27 | Discharge: 2013-02-27 | Disposition: A | Discharge status: Home / Self Care | Payer: Managed Care, Other (non HMO) | Source: Ambulatory Visit

## 2013-02-27 DIAGNOSIS — Z1231 Encounter for screening mammogram for malignant neoplasm of breast: Secondary | ICD-10-CM

## 2013-02-27 DIAGNOSIS — M549 Dorsalgia, unspecified: Secondary | ICD-10-CM

## 2013-02-27 NOTE — Telephone Encounter (Signed)
So need an appt to increase but can refill with same strength, same sig, same number no the norco

## 2013-02-27 NOTE — Telephone Encounter (Signed)
Patient is requesting a new prescription of hydrocodone. She is requesting that the strength be increased but says that it is okay if Dr. Charlett Blake cannot go increase it.

## 2013-02-27 NOTE — Telephone Encounter (Signed)
Last Rx in EMR: HYDROcodone-acetaminophen (NORCO) 10-325 MG per tablet 60 tablet 1 08/13/2012 Sig - Route: Take 1 tablet by mouth every 6 (six) hours as needed for pain. - Oral Class: Print Does not make sense pt would request increase if not filled in past [6] months; called [facility] and verified patient last filled Rx on 12.16.14 with printed script from 07.30.2014 before drug went to C2 class/SLS Please Advise.

## 2013-03-02 MED ORDER — HYDROCODONE-ACETAMINOPHEN 10-325 MG PO TABS: 1.0000 | ORAL_TABLET | Freq: Four times a day (QID) | ORAL | Status: DC | PRN

## 2013-03-02 NOTE — Telephone Encounter (Signed)
Spoke to patient and informed her that Rx was ready for pickup and she states that she has appointment on Friday with Dr. Charlett Blake and will discuss increasing strength then.

## 2013-03-02 NOTE — Telephone Encounter (Signed)
Can you call and inform pt that RX is ready but Dr Charlett Blake states she needs an appt to increase

## 2013-03-06 ENCOUNTER — Encounter: Payer: Self-pay | Admitting: Family Medicine

## 2013-03-06 ENCOUNTER — Ambulatory Visit (INDEPENDENT_AMBULATORY_CARE_PROVIDER_SITE_OTHER): Payer: Managed Care, Other (non HMO) | Admitting: Family Medicine

## 2013-03-06 VITALS — BP 142/90 | HR 57 | Temp 98.1°F | Resp 16 | Ht 67.0 in | Wt 208.1 lb

## 2013-03-06 DIAGNOSIS — I10 Essential (primary) hypertension: Secondary | ICD-10-CM

## 2013-03-06 DIAGNOSIS — H35379 Puckering of macula, unspecified eye: Secondary | ICD-10-CM

## 2013-03-06 DIAGNOSIS — L508 Other urticaria: Secondary | ICD-10-CM

## 2013-03-06 DIAGNOSIS — K219 Gastro-esophageal reflux disease without esophagitis: Secondary | ICD-10-CM

## 2013-03-06 DIAGNOSIS — F32A Depression, unspecified: Secondary | ICD-10-CM

## 2013-03-06 DIAGNOSIS — F418 Other specified anxiety disorders: Secondary | ICD-10-CM

## 2013-03-06 DIAGNOSIS — E039 Hypothyroidism, unspecified: Secondary | ICD-10-CM

## 2013-03-06 DIAGNOSIS — F341 Dysthymic disorder: Secondary | ICD-10-CM

## 2013-03-06 DIAGNOSIS — F3289 Other specified depressive episodes: Secondary | ICD-10-CM

## 2013-03-06 DIAGNOSIS — H35373 Puckering of macula, bilateral: Secondary | ICD-10-CM

## 2013-03-06 DIAGNOSIS — F329 Major depressive disorder, single episode, unspecified: Secondary | ICD-10-CM

## 2013-03-06 DIAGNOSIS — F319 Bipolar disorder, unspecified: Secondary | ICD-10-CM

## 2013-03-06 MED ORDER — METOPROLOL TARTRATE 25 MG PO TABS: 12.5000 mg | ORAL_TABLET | Freq: Two times a day (BID) | ORAL | Status: DC

## 2013-03-06 MED ORDER — ESCITALOPRAM OXALATE 20 MG PO TABS: ORAL_TABLET | ORAL | Status: DC

## 2013-03-06 MED ORDER — OLANZAPINE 2.5 MG PO TABS: 2.5000 mg | ORAL_TABLET | Freq: Every day | ORAL | Status: DC

## 2013-03-06 NOTE — Progress Notes (Signed)
Pre visit review using our clinic review tool, if applicable. No additional management support is needed unless otherwise documented below in the visit note. 

## 2013-03-06 NOTE — Patient Instructions (Signed)

## 2013-03-08 DIAGNOSIS — F410 Panic disorder [episodic paroxysmal anxiety] without agoraphobia: Secondary | ICD-10-CM | POA: Insufficient documentation

## 2013-03-08 NOTE — Progress Notes (Signed)
Patient ID: Joan Robinson, female   DOB: Jun 10, 1960, 53 y.o.   MRN: [id number] Joan Robinson [id number] [birth year] 03/08/2013      Progress Note-Follow Up  Subjective  Chief Complaint  Chief Complaint  Patient presents with  . Hypertension    Pt reports elevated reading at home and with specialist. Has noted some blurred vision since this time.  Readings around 154/100.    HPI  Patient is a 53 yo female in today for the addition of Cipro) a good one. She feels calmer and the people around her Endo that Lexapro has not necessarily been helpful in the past. No recent illness, no cp/palp/sob/gi or gu concerns. Taking meds as prescribed. Is complaining of chronic pain, myalgias and arthralgias. Has had some episodes of feeling light headed. No significant congestion or hearing or vision changes  Past Medical History  Diagnosis Date  . Allergic rhinitis   . Adenomatous colon polyp   . Diverticulosis   . Colitis, Clostridium difficile 5/11,6/11  . Hypothyroidism   . Urinary incontinence   . PUD (peptic ulcer disease)   . Tobacco abuse   . Hypertension   . Hyperlipidemia   . Colon polyp   . Anemia 10/10/2011  . Preventative health care 10/10/2011  . Sinusitis acute 10/10/2011  . Chicken pox as a child    X 2  . Mumps as a child  . Lower back pain   . 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 Staph aureus infection 12/18/2011    Recurrent lesions in nares  . Perimenopausal 01/16/2012  . Anxiety and depression 01/17/2007    Qualifier: Diagnosis of  By: Everardo All MD, Cleophas Dunker   . 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 Right knee pain 05/10/2012  . BCC (basal cell carcinoma of skin) 06/01/2012    Leg Follows with Dr Margo Aye  . Headache(784.0) 08/17/2012  . Bipolar disorder 01/17/2007    Qualifier: Diagnosis of  By: Everardo All MD, Cleophas Dunker     Past Surgical History  Procedure Laterality Date  . Tubal ligation  1995  . Uterine suspension      mesh  . Colonoscopy    . Gated spect wall motion stress  cardiolite  11/05/2001  . Polypectomy    . Abdominal hysterectomy    . Abdominal hysterectomy  2011    complete  . Dilation and curettage of uterus  1985    Family History  Problem Relation Age of Onset  . Heart disease Mother     stents  . Stroke Mother   . Hyperlipidemia Mother   . Hypertension Mother   . Other Mother     blood disorder- mgus  . Colon polyps Father   . Other Father     aorta disection  . Aneurysm Father   . Colon cancer Paternal Uncle   . Irritable bowel syndrome Daughter   . Other Daughter     gastritis  . Mental illness Daughter     bipolar and mood disorder  . Hypertension Sister     ?  Marland Kitchen Mental illness Son     bipolar  . Arthritis Sister   . Other Sister     thyroid  . Other Sister     thyroid    History   Social History  . Marital Status: Married    Spouse Name: N/A    Number of Children: 2  . Years of Education: N/A   Occupational History  . Accounts Receivable    Social  History Main Topics  . Smoking status: Former Smoker -- 1.00 packs/day for 30 years    Types: Cigarettes    Quit date: 05/15/2009  . Smokeless tobacco: Never Used  . Alcohol Use: No  . Drug Use: No  . Sexual Activity: No   Other Topics Concern  . Not on file   Social History Narrative  . No narrative on file    Current Outpatient Prescriptions on File Prior to Visit  Medication Sig Dispense Refill  . albuterol (PROVENTIL HFA;VENTOLIN HFA) 108 (90 BASE) MCG/ACT inhaler Inhale 2 puffs into the lungs every 6 (six) hours as needed for wheezing or shortness of breath.  1 Inhaler  0  . ALPRAZolam (XANAX) 0.25 MG tablet Take 1 tablet (0.25 mg total) by mouth 3 (three) times daily as needed for sleep or anxiety.  30 tablet  1  . calcium carbonate (TUMS E-X 750) 750 MG chewable tablet Chew 2 tablets by mouth daily.        . fexofenadine (ALLEGRA) 180 MG tablet Take 1 tablet (180 mg total) by mouth 2 (two) times daily.  60 tablet  5  . HYDROcodone-acetaminophen  (NORCO) 10-325 MG per tablet Take 1 tablet by mouth every 6 (six) hours as needed.  60 tablet  0  . levothyroxine (SYNTHROID, LEVOTHROID) 88 MCG tablet Take 1 tablet (88 mcg total) by mouth daily.  30 tablet  5  . Loperamide HCl (IMODIUM A-D PO) Take by mouth.      . methylPREDNIsolone (MEDROL DOSPACK) 4 MG tablet follow package directions  21 tablet  0  . mupirocin ointment (BACTROBAN) 2 % Place 1 application into the nose 2 (two) times daily.  22 g  1  . nitroGLYCERIN (NITROSTAT) 0.4 MG SL tablet Place 0.4 mg under the tongue every 5 (five) minutes as needed.        Marland Kitchen omeprazole (PRILOSEC) 40 MG capsule Take 1 capsule (40 mg total) by mouth daily.  90 capsule  3  . ondansetron (ZOFRAN) 4 MG tablet Take 1 tablet (4 mg total) by mouth every 6 (six) hours as needed.  20 tablet  5  . Probiotic Product (PROBIOTIC FORMULA) CAPS Take 1 capsule by mouth daily.        . ranitidine (ZANTAC) 150 MG tablet Take 1 tablet (150 mg total) by mouth 2 (two) times daily.  180 tablet  3   No current facility-administered medications on file prior to visit.    Allergies  Allergen Reactions  . Keflex [Cephalexin] Diarrhea  . Aspirin     REACTION: difficulty breathing  . Dicyclomine Hcl     REACTION: mouth ulcers  . Metronidazole     REACTION: hives, mouth ulcers  . Moxifloxacin     REACTION: increased heart rate, nausea  . Nsaids     REACTION: difficulty breathing  . Phenergan [Promethazine Hcl]     "knocks" pt out for about 3 days  . Quetiapine     Somnolence. Slept for 36 hours straight.  . Sulfamethoxazole-Trimethoprim     REACTION: increased heart rate, nausea    Review of Systems  Review of Systems  Constitutional: Negative for fever and malaise/fatigue.  HENT: Negative for congestion.   Eyes: Negative for discharge.  Respiratory: Negative for shortness of breath.   Cardiovascular: Negative for chest pain, palpitations and leg swelling.  Gastrointestinal: Negative for nausea, abdominal  pain and diarrhea.  Genitourinary: Negative for dysuria.  Musculoskeletal: Negative for falls.  Skin: Negative for rash.  Neurological: Positive for dizziness. Negative for loss of consciousness and headaches.  Endo/Heme/Allergies: Negative for polydipsia.  Psychiatric/Behavioral: Positive for depression. Negative for suicidal ideas. The patient is nervous/anxious. The patient does not have insomnia.     Objective  BP 142/90  Pulse 57  Temp(Src) 98.1 F (36.7 C) (Oral)  Resp 16  Ht 5\' 7"  (1.702 m)  Wt 208 lb 1.3 oz (94.384 kg)  BMI 32.58 kg/m2  SpO2 99%  Physical Exam  Physical Exam  Constitutional: She is oriented to person, place, and time and well-developed, well-nourished, and in no distress. No distress.  HENT:  Head: Normocephalic and atraumatic.  Eyes: Conjunctivae are normal.  Neck: Neck supple. No thyromegaly present.  Cardiovascular: Normal rate, regular rhythm and normal heart sounds.   No murmur heard. Pulmonary/Chest: Effort normal and breath sounds normal. She has no wheezes.  Abdominal: She exhibits no distension and no mass.  Musculoskeletal: She exhibits no edema.  Lymphadenopathy:    She has no cervical adenopathy.  Neurological: She is alert and oriented to person, place, and time.  Skin: Skin is warm and dry. No rash noted. She is not diaphoretic.  Psychiatric: Memory, affect and judgment normal.    Lab Results  Component Value Date   TSH 1.06 07/01/2012   Lab Results  Component Value Date   WBC 5.5 12/19/2012   HGB 12.0 12/19/2012   HCT 35.1* 12/19/2012   MCV 87.1 12/19/2012   PLT 221 12/19/2012   Lab Results  Component Value Date   CREATININE 0.87 12/19/2012   BUN 8 12/19/2012   NA 138 12/19/2012   K 4.1 12/19/2012   CL 102 12/19/2012   CO2 26 12/19/2012   Lab Results  Component Value Date   ALT 17 07/01/2012   AST 19 07/01/2012   ALKPHOS 93 07/01/2012   BILITOT 0.6 07/01/2012   Lab Results  Component Value Date   CHOL 175 07/01/2012   Lab  Results  Component Value Date   HDL 34.80* 07/01/2012   No results found for this basename: [facility]   Lab Results  Component Value Date   TRIG 215.0* 07/01/2012   Lab Results  Component Value Date   CHOLHDL 5 07/01/2012     Assessment & Plan  HYPERTENSION Adequate control, no changes, has had some high numbers when stressed  Bipolar disorder zyprexa is helping. Will try cutting down on Lexapro and stopping because patient does not feel it helps. Reassess at next visit.  HYPOTHYROIDISM Well treated on current dose of Levothyroxine  GERD Adequately controlled, no changes  Urticaria, chronic Improved at this time.  Macular pucker, bilateral Following with opthamology

## 2013-03-08 NOTE — Assessment & Plan Note (Signed)
Following with opthamology 

## 2013-03-08 NOTE — Assessment & Plan Note (Signed)
Improved at this time. 

## 2013-03-08 NOTE — Assessment & Plan Note (Addendum)
Adequate control, no changes, has had some high numbers when stressed

## 2013-03-08 NOTE — Assessment & Plan Note (Signed)
Well treated on current dose of Levothyroxine 

## 2013-03-08 NOTE — Assessment & Plan Note (Signed)
Adequately controlled, no changes 

## 2013-03-08 NOTE — Assessment & Plan Note (Signed)
zyprexa is helping. Will try cutting down on Lexapro and stopping because patient does not feel it helps. Reassess at next visit.

## 2013-03-09 ENCOUNTER — Telehealth: Payer: Self-pay | Admitting: Family Medicine

## 2013-03-09 ENCOUNTER — Other Ambulatory Visit: Payer: Self-pay | Admitting: Family Medicine

## 2013-03-09 DIAGNOSIS — G894 Chronic pain syndrome: Secondary | ICD-10-CM

## 2013-03-09 NOTE — Telephone Encounter (Signed)
Requesting referral to Rheumatologist

## 2013-03-09 NOTE — Telephone Encounter (Signed)
Relevant patient education mailed to patient.  

## 2013-03-13 ENCOUNTER — Telehealth: Payer: Self-pay | Admitting: Family Medicine

## 2013-03-13 DIAGNOSIS — E039 Hypothyroidism, unspecified: Secondary | ICD-10-CM

## 2013-03-13 MED ORDER — LEVOTHYROXINE SODIUM 88 MCG PO TABS: 88.0000 ug | ORAL_TABLET | Freq: Every day | ORAL | Status: DC

## 2013-03-13 NOTE — Telephone Encounter (Signed)
Refills sent

## 2013-03-13 NOTE — Telephone Encounter (Signed)
Refill synthroid

## 2013-03-16 ENCOUNTER — Ambulatory Visit: Payer: Managed Care, Other (non HMO) | Admitting: Family

## 2013-03-17 ENCOUNTER — Telehealth: Payer: Self-pay

## 2013-03-17 NOTE — Telephone Encounter (Signed)
Pt states hands are the worst then knees. Please advise?  So please clarify with patient, rheumatology only wants to see her if she has joint pain. I know she has muscle and joint pains, but are her joint pains the most notable symptom? If so which joints are the worst? They may be willing to see her if the joints are the most notable.    ----- Message ----- From: Synthia Innocent Sent: 03/11/2013 4:29 PM To: Mosie Lukes, MD Dr Amil Amen will not treat this pt with this dx, he did however say he would treat for only joint pain. Please advise

## 2013-03-18 NOTE — Telephone Encounter (Signed)
So clarified with patient her main complaints are joint pain in hands and b/l knee pain, will they see her for that? Please clarify for that?

## 2013-03-18 NOTE — Telephone Encounter (Signed)
Dr Melissa Noon ofc called back she will ask him if he will treat her for joint pain in hands/knees. She will call back and let me know.

## 2013-03-18 NOTE — Telephone Encounter (Signed)
Left message with Dr Melissa Noon ofc, awaiting return call

## 2013-03-19 NOTE — Telephone Encounter (Signed)
Dr Amil Amen has agreed to see pt, has appt on 04/02/13 @9 .

## 2013-03-25 ENCOUNTER — Encounter: Payer: Self-pay | Admitting: Family Medicine

## 2013-04-03 ENCOUNTER — Encounter: Payer: Self-pay | Admitting: Family Medicine

## 2013-04-03 ENCOUNTER — Ambulatory Visit (INDEPENDENT_AMBULATORY_CARE_PROVIDER_SITE_OTHER): Payer: Managed Care, Other (non HMO) | Admitting: Family Medicine

## 2013-04-03 VITALS — BP 135/84 | HR 63 | Temp 97.7°F | Resp 18 | Ht 67.0 in | Wt 211.0 lb

## 2013-04-03 DIAGNOSIS — M7918 Myalgia, other site: Secondary | ICD-10-CM

## 2013-04-03 DIAGNOSIS — R079 Chest pain, unspecified: Secondary | ICD-10-CM

## 2013-04-03 DIAGNOSIS — R0781 Pleurodynia: Secondary | ICD-10-CM

## 2013-04-03 DIAGNOSIS — R0789 Other chest pain: Secondary | ICD-10-CM

## 2013-04-03 NOTE — Progress Notes (Signed)
OFFICE NOTE  04/03/2013  CC:  Chief Complaint  Patient presents with  . Breast Pain    pain under left breast, rib     HPI: Patient is a 53 y.o. Caucasian female who is here for pain under left breast.  She has noted this problem intermittently for months, but lately has started to feel it daily. Feels like "something is swollen and when she moves certain ways it feels like it is pulling, esp bending forward or turning her torso"--points to left anterolaterl ribs (lower).  The pain is brief and sometimes sharp and sometimes spasm-like.  Sometimes extends down into LUQ abd region. She notes no rash.  Doesn't recall and injury/trauma.  Pt says this does NOT feel like it is in her breast tissue at all. She has had a mammogram this year that was normal.  Pertinent PMH:  Past medical, surgical, social, and family history reviewed and no changes are noted since last office visit.  MEDS:  Outpatient Prescriptions Prior to Visit  Medication Sig Dispense Refill  . albuterol (PROVENTIL HFA;VENTOLIN HFA) 108 (90 BASE) MCG/ACT inhaler Inhale 2 puffs into the lungs every 6 (six) hours as needed for wheezing or shortness of breath.  1 Inhaler  0  . ALPRAZolam (XANAX) 0.25 MG tablet Take 1 tablet (0.25 mg total) by mouth 3 (three) times daily as needed for sleep or anxiety.  30 tablet  1  . calcium carbonate (TUMS E-X 750) 750 MG chewable tablet Chew 2 tablets by mouth daily.        . fexofenadine (ALLEGRA) 180 MG tablet Take 1 tablet (180 mg total) by mouth 2 (two) times daily.  60 tablet  5  . HYDROcodone-acetaminophen (NORCO) 10-325 MG per tablet Take 1 tablet by mouth every 6 (six) hours as needed.  60 tablet  0  . levothyroxine (SYNTHROID, LEVOTHROID) 88 MCG tablet Take 1 tablet (88 mcg total) by mouth daily.  30 tablet  5  . Loperamide HCl (IMODIUM A-D PO) Take by mouth.      . metoprolol tartrate (LOPRESSOR) 25 MG tablet Take 0.5 tablets (12.5 mg total) by mouth 2 (two) times daily.  45  tablet  3  . nitroGLYCERIN (NITROSTAT) 0.4 MG SL tablet Place 0.4 mg under the tongue every 5 (five) minutes as needed.        Marland Kitchen OLANZapine (ZYPREXA) 2.5 MG tablet Take 1 tablet (2.5 mg total) by mouth at bedtime.  30 tablet  4  . omeprazole (PRILOSEC) 40 MG capsule Take 1 capsule (40 mg total) by mouth daily.  90 capsule  3  . ondansetron (ZOFRAN) 4 MG tablet Take 1 tablet (4 mg total) by mouth every 6 (six) hours as needed.  20 tablet  5  . Probiotic Product (PROBIOTIC FORMULA) CAPS Take 1 capsule by mouth daily.        . ranitidine (ZANTAC) 150 MG tablet Take 1 tablet (150 mg total) by mouth 2 (two) times daily.  180 tablet  3  . escitalopram (LEXAPRO) 20 MG tablet 1/2 tab every other day x 2 weeks then stop  30 tablet  3  . methylPREDNIsolone (MEDROL DOSPACK) 4 MG tablet follow package directions  21 tablet  0  . mupirocin ointment (BACTROBAN) 2 % Place 1 application into the nose 2 (two) times daily.  22 g  1   No facility-administered medications prior to visit.    PE: Blood pressure 135/84, pulse 63, temperature 97.7 F (36.5 C), temperature source Oral, resp.  rate 18, height 5\' 7"  (1.702 m), weight 211 lb (95.709 kg), SpO2 99.00%. Pt examined with CMA Jacklynn Ganong as chaperone. Gen: Alert, well appearing.  Patient is oriented to person, place, time, and situation. CV: RRR, no m/r/g.   LUNGS: CTA bilat, nonlabored resps, good aeration in all lung fields. Left anterolateral lower rib tender from about midclavicular line extending around to mid axillary line region. No bruising, rash, or crepitus in the area.  No abd tenderness, no distention or HSM palpable.    IMPRESSION AND PLAN:  Left lower rib and intercostal muscle pain. Reassured pt.  No sign of any other abnormality. Watchful waiting for spontaneous resolution.  An After Visit Summary was printed and given to the patient.  FOLLOW UP: prn

## 2013-04-28 ENCOUNTER — Telehealth: Payer: Self-pay | Admitting: Family Medicine

## 2013-04-28 DIAGNOSIS — K219 Gastro-esophageal reflux disease without esophagitis: Principal | ICD-10-CM

## 2013-04-28 DIAGNOSIS — IMO0001 Reserved for inherently not codable concepts without codable children: Secondary | ICD-10-CM

## 2013-04-28 MED ORDER — RANITIDINE HCL 150 MG PO TABS: 150.0000 mg | ORAL_TABLET | Freq: Two times a day (BID) | ORAL | Status: DC

## 2013-04-28 NOTE — Telephone Encounter (Signed)
Refill ranitidine

## 2013-04-28 NOTE — Telephone Encounter (Signed)
Sent to [employer] [facility]

## 2013-04-29 ENCOUNTER — Ambulatory Visit (INDEPENDENT_AMBULATORY_CARE_PROVIDER_SITE_OTHER): Payer: 59 | Admitting: Ophthalmology

## 2013-04-30 ENCOUNTER — Encounter: Payer: Self-pay | Admitting: Internal Medicine

## 2013-04-30 ENCOUNTER — Encounter: Payer: Managed Care, Other (non HMO) | Admitting: Internal Medicine

## 2013-04-30 NOTE — Progress Notes (Signed)
Subjective:    Patient ID: Joan Robinson, female    DOB: 16-Oct-1960, 53 y.o.   MRN: [id number]  HPI  Joan Robinson is here for first visit her primary care is Dr. Abner Robinson.   She also sees multiple specialists.   She is on psychiatric meds and chronic narcotics that I informed pt that I do not prescribe as a primary care MD and that I have nothing to offer pt that her current primary care MD could not provide.  I do not prescribe Zyprexa and chronic narcotics and informed pt of this.  She has seen a psychiatrist in the past and asks if I know of any good psychiatrists.  I gave her the number to Dr. Evelene Robinson and Dr. Nolen Robinson.    I advised her to stay with her current primary care MD and that I had nothing new to offer her.       Review of Systems     Objective:   Physical Exam        Assessment & Plan:

## 2013-05-01 ENCOUNTER — Ambulatory Visit: Payer: Managed Care, Other (non HMO) | Admitting: Family Medicine

## 2013-05-15 ENCOUNTER — Telehealth: Payer: Self-pay

## 2013-05-15 MED ORDER — AZITHROMYCIN 250 MG PO TABS: ORAL_TABLET | ORAL | Status: DC

## 2013-05-15 NOTE — Telephone Encounter (Signed)
Patient informed and voiced understanding.  RX sent 

## 2013-05-15 NOTE — Telephone Encounter (Signed)
Patient left a message stating that she is coughing and spitting up yellow-thick "stuff". Doesn't have anything coming out of her nose. Pt states she doesn't have the money to come in and would like md to call in a ZPAK? Patient stated she thinks she has bronchitis again or a upper respiratory infection.  Please advise?

## 2013-05-15 NOTE — Telephone Encounter (Signed)
I am willing to let her have 1 Zpak and have her increase rest and hydration, add probiotics, zinc such as Coldeze or Xicam. Treat fevers as needed and come in if worse or no improvement. Azithromycin 250 mg tabs 2 tabs po once then 1 tab po daily x 4 days

## 2013-05-18 ENCOUNTER — Ambulatory Visit: Payer: Managed Care, Other (non HMO) | Admitting: Nurse Practitioner

## 2013-06-05 ENCOUNTER — Ambulatory Visit (INDEPENDENT_AMBULATORY_CARE_PROVIDER_SITE_OTHER): Payer: Managed Care, Other (non HMO) | Admitting: Family Medicine

## 2013-06-05 ENCOUNTER — Encounter: Payer: Self-pay | Admitting: Family Medicine

## 2013-06-05 VITALS — BP 141/86 | HR 71 | Temp 97.7°F | Resp 18 | Ht 67.0 in | Wt 209.0 lb

## 2013-06-05 DIAGNOSIS — F319 Bipolar disorder, unspecified: Secondary | ICD-10-CM

## 2013-06-05 DIAGNOSIS — K3189 Other diseases of stomach and duodenum: Secondary | ICD-10-CM

## 2013-06-05 DIAGNOSIS — I10 Essential (primary) hypertension: Secondary | ICD-10-CM

## 2013-06-05 DIAGNOSIS — M549 Dorsalgia, unspecified: Secondary | ICD-10-CM

## 2013-06-05 DIAGNOSIS — E785 Hyperlipidemia, unspecified: Secondary | ICD-10-CM

## 2013-06-05 DIAGNOSIS — F32A Depression, unspecified: Secondary | ICD-10-CM

## 2013-06-05 DIAGNOSIS — F329 Major depressive disorder, single episode, unspecified: Secondary | ICD-10-CM

## 2013-06-05 DIAGNOSIS — F988 Other specified behavioral and emotional disorders with onset usually occurring in childhood and adolescence: Secondary | ICD-10-CM

## 2013-06-05 DIAGNOSIS — F3289 Other specified depressive episodes: Secondary | ICD-10-CM

## 2013-06-05 DIAGNOSIS — K219 Gastro-esophageal reflux disease without esophagitis: Secondary | ICD-10-CM

## 2013-06-05 DIAGNOSIS — E039 Hypothyroidism, unspecified: Secondary | ICD-10-CM

## 2013-06-05 DIAGNOSIS — R1013 Epigastric pain: Secondary | ICD-10-CM

## 2013-06-05 MED ORDER — OLANZAPINE 5 MG PO TABS: 5.0000 mg | ORAL_TABLET | Freq: Every day | ORAL | Status: DC

## 2013-06-05 MED ORDER — SUCRALFATE 1 G PO TABS: 1.0000 g | ORAL_TABLET | Freq: Three times a day (TID) | ORAL | Status: DC

## 2013-06-05 MED ORDER — AMPHETAMINE-DEXTROAMPHET ER 30 MG PO CP24: 30.0000 mg | ORAL_CAPSULE | ORAL | Status: DC

## 2013-06-05 MED ORDER — ALPRAZOLAM 0.25 MG PO TABS: 0.2500 mg | ORAL_TABLET | Freq: Three times a day (TID) | ORAL | Status: DC | PRN

## 2013-06-05 MED ORDER — HYDROCODONE-ACETAMINOPHEN 10-325 MG PO TABS: 1.0000 | ORAL_TABLET | Freq: Four times a day (QID) | ORAL | Status: DC | PRN

## 2013-06-05 NOTE — Assessment & Plan Note (Signed)
Well controlled, no changes to meds. Encouraged heart healthy diet such as the DASH diet and exercise as tolerated.  °

## 2013-06-05 NOTE — Progress Notes (Signed)
Pre visit review using our clinic review tool, if applicable. No additional management support is needed unless otherwise documented below in the visit note. 

## 2013-06-05 NOTE — Patient Instructions (Signed)

## 2013-06-08 ENCOUNTER — Encounter: Payer: Self-pay | Admitting: Family Medicine

## 2013-06-08 NOTE — Assessment & Plan Note (Signed)
Encouraged heart healthy diet, increase exercise, avoid trans fats, consider a krill oil cap daily 

## 2013-06-08 NOTE — Assessment & Plan Note (Signed)
Stopped the Zyprexa due to frustrations over weight gain but has continued to gain due to stress and poor diet. Will restart at Zyprexa at 5 mg daily

## 2013-06-08 NOTE — Assessment & Plan Note (Signed)
On Levothyroxine, continue to monitor 

## 2013-06-08 NOTE — Assessment & Plan Note (Signed)
Will need to hold PPI for 2 weeks so she can proceed with H pylori breath test. Continue Ranitidine and add carafate and avoid offending foods

## 2013-06-08 NOTE — Progress Notes (Signed)
Patient ID: Joan Robinson, female   DOB: December 17, 1960, 53 y.o.   MRN: [id number] Joan Robinson [id number] 1960/10/07 06/08/2013      Progress Note-Follow Up  Subjective  Chief Complaint  Chief Complaint  Patient presents with  . Follow-up  . Medication Problem    has tried Nexium, Prilosec not effective    HPI  Patient is a 53 year old female in today for routine medical care. She is in today for follow up and is very agitated. Cries easily and acknowledges that work and home are very stressful. She has been excessively irritable and struggles with anhedonia. Denies suicidal ideation. Has trouble sleeping and staying calm. She's also complaining of sore throat worsening dyspepsia. Wakes up with sour taste in her mouth frequently. Denies CP/palp/SOB/HA/congestion/fevers/ or GU c/o. Taking meds as prescribed  Past Medical History  Diagnosis Date  . Allergic rhinitis   . Adenomatous colon polyp   . Diverticulosis   . Colitis, Clostridium difficile 5/11,6/11  . Hypothyroidism   . Urinary incontinence   . PUD (peptic ulcer disease)   . Tobacco abuse   . Hypertension   . Hyperlipidemia   . Colon polyp   . Anemia 10/10/2011  . Preventative health care 10/10/2011  . Sinusitis acute 10/10/2011  . Chicken pox as a child    X 2  . Mumps as a child  . Lower back pain   . 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 Staph aureus infection 12/18/2011    Recurrent lesions in nares  . Perimenopausal 01/16/2012  . Anxiety and depression 01/17/2007    Qualifier: Diagnosis of  By: Everardo All MD, Cleophas Dunker   . 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 Right knee pain 05/10/2012  . BCC (basal cell carcinoma of skin) 06/01/2012    Leg Follows with Dr Margo Aye  . Headache(784.0) 08/17/2012  . Bipolar disorder 01/17/2007    Qualifier: Diagnosis of  By: Everardo All MD, Cleophas Dunker     Past Surgical History  Procedure Laterality Date  . Tubal ligation  1995  . Uterine suspension      mesh  . Colonoscopy    . Gated spect wall motion  stress cardiolite  11/05/2001  . Polypectomy    . Abdominal hysterectomy    . Abdominal hysterectomy  2011    complete  . Dilation and curettage of uterus  1985    Family History  Problem Relation Age of Onset  . Heart disease Mother     stents  . Stroke Mother   . Hyperlipidemia Mother   . Hypertension Mother   . Other Mother     blood disorder- mgus  . Colon polyps Father   . Other Father     aorta disection  . Aneurysm Father   . Colon cancer Paternal Uncle   . Irritable bowel syndrome Daughter   . Other Daughter     gastritis  . Mental illness Daughter     bipolar and mood disorder  . Hypertension Sister     ?  Marland Kitchen Mental illness Son     bipolar  . Arthritis Sister   . Other Sister     thyroid  . Other Sister     thyroid    History   Social History  . Marital Status: Married    Spouse Name: N/A    Number of Children: 2  . Years of Education: N/A   Occupational History  . 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  Smoking status: Former Smoker -- 1.00 packs/day for 30 years    Types: Cigarettes    Quit date: 05/15/2009  . Smokeless tobacco: Never Used  . Alcohol Use: No  . Drug Use: No  . Sexual Activity: No   Other Topics Concern  . Not on file   Social History Narrative  . No narrative on file    Current Outpatient Prescriptions on File Prior to Visit  Medication Sig Dispense Refill  . albuterol (PROVENTIL HFA;VENTOLIN HFA) 108 (90 BASE) MCG/ACT inhaler Inhale 2 puffs into the lungs every 6 (six) hours as needed for wheezing or shortness of breath.  1 Inhaler  0  . levothyroxine (SYNTHROID, LEVOTHROID) 88 MCG tablet Take 1 tablet (88 mcg total) by mouth daily.  30 tablet  5  . metoprolol tartrate (LOPRESSOR) 25 MG tablet Take 0.5 tablets (12.5 mg total) by mouth 2 (two) times daily.  45 tablet  3  . nitroGLYCERIN (NITROSTAT) 0.4 MG SL tablet Place 0.4 mg under the tongue every 5 (five) minutes as needed.        Marland Kitchen omeprazole  (PRILOSEC) 40 MG capsule Take 1 capsule (40 mg total) by mouth daily.  90 capsule  3  . ondansetron (ZOFRAN) 4 MG tablet Take 1 tablet (4 mg total) by mouth every 6 (six) hours as needed.  20 tablet  5  . Probiotic Product (PROBIOTIC FORMULA) CAPS Take 1 capsule by mouth daily.        . ranitidine (ZANTAC) 150 MG tablet Take 1 tablet (150 mg total) by mouth 2 (two) times daily.  180 tablet  0  . calcium carbonate (TUMS E-X 750) 750 MG chewable tablet Chew 2 tablets by mouth daily.        . fexofenadine (ALLEGRA) 180 MG tablet Take 1 tablet (180 mg total) by mouth 2 (two) times daily.  60 tablet  5  . hydroxychloroquine (PLAQUENIL) 200 MG tablet Take by mouth 2 (two) times daily.      . Loperamide HCl (IMODIUM A-D PO) Take by mouth.       No current facility-administered medications on file prior to visit.    Allergies  Allergen Reactions  . Keflex [Cephalexin] Diarrhea  . Aspirin     REACTION: difficulty breathing  . Dicyclomine Hcl     REACTION: mouth ulcers  . Metronidazole     REACTION: hives, mouth ulcers  . Moxifloxacin     REACTION: increased heart rate, nausea  . Nsaids     REACTION: difficulty breathing  . Phenergan [Promethazine Hcl]     "knocks" pt out for about 3 days  . Quetiapine     Somnolence. Slept for 36 hours straight.  . Sulfamethoxazole-Trimethoprim     REACTION: increased heart rate, nausea    Review of Systems  Review of Systems  Constitutional: Positive for malaise/fatigue. Negative for fever.  HENT: Positive for sore throat. Negative for congestion.   Eyes: Negative for discharge.  Respiratory: Negative for shortness of breath.   Cardiovascular: Negative for chest pain, palpitations and leg swelling.  Gastrointestinal: Positive for heartburn. Negative for nausea, abdominal pain and diarrhea.  Genitourinary: Negative for dysuria.  Musculoskeletal: Negative for falls.  Skin: Negative for rash.  Neurological: Negative for loss of consciousness and  headaches.  Endo/Heme/Allergies: Negative for polydipsia.  Psychiatric/Behavioral: Positive for depression. Negative for suicidal ideas. The patient is nervous/anxious. The patient does not have insomnia.     Objective  BP 141/86  Pulse 71  Temp(Src) 97.7  F (36.5 C) (Oral)  Resp 18  Ht 5\' 7"  (1.702 m)  Wt 209 lb (94.802 kg)  BMI 32.73 kg/m2  SpO2 98%  Physical Exam  Physical Exam  Constitutional: She is oriented to person, place, and time and well-developed, well-nourished, and in no distress. No distress.  HENT:  Head: Normocephalic and atraumatic.  Eyes: Conjunctivae are normal.  Neck: Neck supple. No thyromegaly present.  Cardiovascular: Normal rate, regular rhythm and normal heart sounds.   No murmur heard. Pulmonary/Chest: Effort normal and breath sounds normal. She has no wheezes.  Abdominal: She exhibits no distension and no mass.  Musculoskeletal: She exhibits no edema.  Lymphadenopathy:    She has no cervical adenopathy.  Neurological: She is alert and oriented to person, place, and time.  Skin: Skin is warm and dry. No rash noted. She is not diaphoretic.  Psychiatric: Memory, affect and judgment normal.    Lab Results  Component Value Date   TSH 1.06 07/01/2012   Lab Results  Component Value Date   WBC 5.5 12/19/2012   HGB 12.0 12/19/2012   HCT 35.1* 12/19/2012   MCV 87.1 12/19/2012   PLT 221 12/19/2012   Lab Results  Component Value Date   CREATININE 0.87 12/19/2012   BUN 8 12/19/2012   NA 138 12/19/2012   K 4.1 12/19/2012   CL 102 12/19/2012   CO2 26 12/19/2012   Lab Results  Component Value Date   ALT 17 07/01/2012   AST 19 07/01/2012   ALKPHOS 93 07/01/2012   BILITOT 0.6 07/01/2012   Lab Results  Component Value Date   CHOL 175 07/01/2012   Lab Results  Component Value Date   HDL 34.80* 07/01/2012   No results found for this basename: [facility]   Lab Results  Component Value Date   TRIG 215.0* 07/01/2012   Lab Results  Component Value Date    CHOLHDL 5 07/01/2012     Assessment & Plan  HYPERTENSION Well controlled, no changes to meds. Encouraged heart healthy diet such as the DASH diet and exercise as tolerated.   ADD (attention deficit disorder) Given refill on Adderall today  HYPOTHYROIDISM On Levothyroxine, continue to monitor  HYPERLIPIDEMIA Encouraged heart healthy diet, increase exercise, avoid trans fats, consider a krill oil cap daily  Bipolar disorder Stopped the Zyprexa due to frustrations over weight gain but has continued to gain due to stress and poor diet. Will restart at Zyprexa at 5 mg daily  GERD Will need to hold PPI for 2 weeks so she can proceed with H pylori breath test. Continue Ranitidine and add carafate and avoid offending foods

## 2013-06-08 NOTE — Assessment & Plan Note (Signed)
Given refill on Adderall today

## 2013-06-21 LAB — H. PYLORI BREATH TEST: H. pylori Breath Test: NOT DETECTED

## 2013-07-15 ENCOUNTER — Telehealth: Payer: Self-pay | Admitting: Family Medicine

## 2013-07-15 NOTE — Telephone Encounter (Signed)
Received paperwork for PA on [employer], forward to nurse

## 2013-07-24 ENCOUNTER — Ambulatory Visit (INDEPENDENT_AMBULATORY_CARE_PROVIDER_SITE_OTHER): Payer: Managed Care, Other (non HMO) | Admitting: Family Medicine

## 2013-07-24 ENCOUNTER — Encounter: Payer: Self-pay | Admitting: Family Medicine

## 2013-07-24 ENCOUNTER — Ambulatory Visit: Payer: Managed Care, Other (non HMO) | Admitting: Family Medicine

## 2013-07-24 VITALS — BP 132/80 | HR 71 | Temp 98.0°F | Ht 67.0 in | Wt 209.0 lb

## 2013-07-24 DIAGNOSIS — N390 Urinary tract infection, site not specified: Secondary | ICD-10-CM

## 2013-07-24 DIAGNOSIS — I10 Essential (primary) hypertension: Secondary | ICD-10-CM

## 2013-07-24 DIAGNOSIS — T7840XD Allergy, unspecified, subsequent encounter: Secondary | ICD-10-CM

## 2013-07-24 DIAGNOSIS — F3170 Bipolar disorder, currently in remission, most recent episode unspecified: Secondary | ICD-10-CM

## 2013-07-24 DIAGNOSIS — F319 Bipolar disorder, unspecified: Secondary | ICD-10-CM

## 2013-07-24 DIAGNOSIS — IMO0001 Reserved for inherently not codable concepts without codable children: Secondary | ICD-10-CM

## 2013-07-24 DIAGNOSIS — M129 Arthropathy, unspecified: Secondary | ICD-10-CM

## 2013-07-24 DIAGNOSIS — A0472 Enterocolitis due to Clostridium difficile, not specified as recurrent: Secondary | ICD-10-CM

## 2013-07-24 DIAGNOSIS — F988 Other specified behavioral and emotional disorders with onset usually occurring in childhood and adolescence: Secondary | ICD-10-CM

## 2013-07-24 DIAGNOSIS — M5441 Lumbago with sciatica, right side: Secondary | ICD-10-CM

## 2013-07-24 DIAGNOSIS — F32A Depression, unspecified: Secondary | ICD-10-CM

## 2013-07-24 DIAGNOSIS — E039 Hypothyroidism, unspecified: Secondary | ICD-10-CM

## 2013-07-24 DIAGNOSIS — R1013 Epigastric pain: Secondary | ICD-10-CM

## 2013-07-24 DIAGNOSIS — K219 Gastro-esophageal reflux disease without esophagitis: Secondary | ICD-10-CM

## 2013-07-24 DIAGNOSIS — M5430 Sciatica, unspecified side: Secondary | ICD-10-CM

## 2013-07-24 DIAGNOSIS — K3189 Other diseases of stomach and duodenum: Secondary | ICD-10-CM

## 2013-07-24 DIAGNOSIS — F329 Major depressive disorder, single episode, unspecified: Secondary | ICD-10-CM

## 2013-07-24 DIAGNOSIS — F3289 Other specified depressive episodes: Secondary | ICD-10-CM

## 2013-07-24 DIAGNOSIS — M1990 Unspecified osteoarthritis, unspecified site: Secondary | ICD-10-CM

## 2013-07-24 DIAGNOSIS — L508 Other urticaria: Secondary | ICD-10-CM

## 2013-07-24 HISTORY — DX: Unspecified osteoarthritis, unspecified site: M19.90

## 2013-07-24 HISTORY — DX: Other urticaria: L50.8

## 2013-07-24 MED ORDER — LEVOTHYROXINE SODIUM 88 MCG PO TABS: 88.0000 ug | ORAL_TABLET | Freq: Every day | ORAL | Status: DC

## 2013-07-24 MED ORDER — AMPHETAMINE-DEXTROAMPHETAMINE 20 MG PO TABS: 20.0000 mg | ORAL_TABLET | Freq: Two times a day (BID) | ORAL | Status: DC

## 2013-07-24 MED ORDER — HYDROCODONE-ACETAMINOPHEN 10-325 MG PO TABS: 1.0000 | ORAL_TABLET | Freq: Four times a day (QID) | ORAL | Status: DC | PRN

## 2013-07-24 MED ORDER — OLANZAPINE 5 MG PO TABS: 5.0000 mg | ORAL_TABLET | Freq: Every day | ORAL | Status: DC

## 2013-07-24 MED ORDER — LORATADINE 10 MG PO TABS: 10.0000 mg | ORAL_TABLET | Freq: Two times a day (BID) | ORAL | Status: DC | PRN

## 2013-07-24 MED ORDER — METOPROLOL TARTRATE 25 MG PO TABS: 12.5000 mg | ORAL_TABLET | Freq: Two times a day (BID) | ORAL | Status: DC

## 2013-07-24 MED ORDER — SUCRALFATE 1 G PO TABS: 1.0000 g | ORAL_TABLET | Freq: Three times a day (TID) | ORAL | Status: DC

## 2013-07-24 MED ORDER — RANITIDINE HCL 150 MG PO TABS: 150.0000 mg | ORAL_TABLET | Freq: Two times a day (BID) | ORAL | Status: DC

## 2013-07-24 NOTE — Assessment & Plan Note (Signed)
Adderall working well

## 2013-07-24 NOTE — Assessment & Plan Note (Signed)
Well controlled on probiotics

## 2013-07-24 NOTE — Progress Notes (Signed)
Patient ID: Joan Robinson, female   DOB: 08-17-1960, 53 y.o.   MRN: [id number] Joan Robinson [id number] 10-17-1960 07/24/2013      Progress Note-Follow Up  Subjective  Chief Complaint  Chief Complaint  Patient presents with  . Follow-up    4 week    HPI  Patient is a 53 year old female in today for routine medical care. She is in today for followup. Doing fairly well with struggling with increased lower back pain recently. Has some radicular symptoms down the right leg. No recent falls or injury. Has some ongoing left foot pain and swelling. Is following now with rheumatology for hand pain as well. Has just started on methotrexate. Reports her energy and mood are improved with increased Zyprexa and Adderall. Denies CP/palp/SOB/HA/congestion/fevers/GI or GU c/o. Taking meds as prescribed  Past Medical History  Diagnosis Date  . Allergic rhinitis   . Adenomatous colon polyp   . Diverticulosis   . Colitis, Clostridium difficile 5/11,6/11  . Hypothyroidism   . Urinary incontinence   . PUD (peptic ulcer disease)   . Tobacco abuse   . Hypertension   . Hyperlipidemia   . Colon polyp   . Anemia 10/10/2011  . Preventative health care 10/10/2011  . Sinusitis acute 10/10/2011  . Chicken pox as a child    X 2  . Mumps as a child  . Lower back pain   . 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 Staph aureus infection 12/18/2011    Recurrent lesions in nares  . Perimenopausal 01/16/2012  . Anxiety and depression 01/17/2007    Qualifier: Diagnosis of  By: Everardo All MD, Cleophas Dunker   . 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 Right knee pain 05/10/2012  . BCC (basal cell carcinoma of skin) 06/01/2012    Leg Follows with Dr Margo Aye  . Headache(784.0) 08/17/2012  . Bipolar disorder 01/17/2007    Qualifier: Diagnosis of  By: Everardo All MD, Cleophas Dunker     Past Surgical History  Procedure Laterality Date  . Tubal ligation  1995  . Uterine suspension      mesh  . Colonoscopy    . Gated spect wall motion stress cardiolite   11/05/2001  . Polypectomy    . Abdominal hysterectomy    . Abdominal hysterectomy  2011    complete  . Dilation and curettage of uterus  1985    Family History  Problem Relation Age of Onset  . Heart disease Mother     stents  . Stroke Mother   . Hyperlipidemia Mother   . Hypertension Mother   . Other Mother     blood disorder- mgus  . Colon polyps Father   . Other Father     aorta disection  . Aneurysm Father   . Colon cancer Paternal Uncle   . Irritable bowel syndrome Daughter   . Other Daughter     gastritis  . Mental illness Daughter     bipolar and mood disorder  . Hypertension Sister     ?  Marland Kitchen Mental illness Son     bipolar  . Arthritis Sister   . Other Sister     thyroid  . Other Sister     thyroid    History   Social History  . Marital Status: Married    Spouse Name: N/A    Number of Children: 2  . Years of Education: N/A   Occupational History  . 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  Smoking status: Former Smoker -- 1.00 packs/day for 30 years    Types: Cigarettes    Quit date: 05/15/2009  . Smokeless tobacco: Never Used  . Alcohol Use: No  . Drug Use: No  . Sexual Activity: No   Other Topics Concern  . Not on file   Social History Narrative  . No narrative on file    Current Outpatient Prescriptions on File Prior to Visit  Medication Sig Dispense Refill  . albuterol (PROVENTIL HFA;VENTOLIN HFA) 108 (90 BASE) MCG/ACT inhaler Inhale 2 puffs into the lungs every 6 (six) hours as needed for wheezing or shortness of breath.  1 Inhaler  0  . ALPRAZolam (XANAX) 0.25 MG tablet Take 1 tablet (0.25 mg total) by mouth 3 (three) times daily as needed for sleep or anxiety.  30 tablet  3  . amphetamine-dextroamphetamine (ADDERALL XR) 30 MG 24 hr capsule Take 1 capsule (30 mg total) by mouth every morning.  30 capsule  0  . calcium carbonate (TUMS E-X 750) 750 MG chewable tablet Chew 2 tablets by mouth daily.        . fexofenadine  (ALLEGRA) 180 MG tablet Take 1 tablet (180 mg total) by mouth 2 (two) times daily.  60 tablet  5  . HYDROcodone-acetaminophen (NORCO) 10-325 MG per tablet Take 1 tablet by mouth every 6 (six) hours as needed.  70 tablet  0  . levothyroxine (SYNTHROID, LEVOTHROID) 88 MCG tablet Take 1 tablet (88 mcg total) by mouth daily.  30 tablet  5  . Loperamide HCl (IMODIUM A-D PO) Take by mouth.      . metoprolol tartrate (LOPRESSOR) 25 MG tablet Take 0.5 tablets (12.5 mg total) by mouth 2 (two) times daily.  45 tablet  3  . nitroGLYCERIN (NITROSTAT) 0.4 MG SL tablet Place 0.4 mg under the tongue every 5 (five) minutes as needed.        Marland Kitchen OLANZapine (ZYPREXA) 5 MG tablet Take 1 tablet (5 mg total) by mouth at bedtime.  30 tablet  2  . omeprazole (PRILOSEC) 40 MG capsule Take 1 capsule (40 mg total) by mouth daily.  90 capsule  3  . ondansetron (ZOFRAN) 4 MG tablet Take 1 tablet (4 mg total) by mouth every 6 (six) hours as needed.  20 tablet  5  . Probiotic Product (PROBIOTIC FORMULA) CAPS Take 1 capsule by mouth daily.        . ranitidine (ZANTAC) 150 MG tablet Take 1 tablet (150 mg total) by mouth 2 (two) times daily.  180 tablet  0  . sucralfate (CARAFATE) 1 G tablet Take 1 tablet (1 g total) by mouth 4 (four) times daily -  with meals and at bedtime.  120 tablet  3   No current facility-administered medications on file prior to visit.    Allergies  Allergen Reactions  . Keflex [Cephalexin] Diarrhea  . Aspirin     REACTION: difficulty breathing  . Dicyclomine Hcl     REACTION: mouth ulcers  . Metronidazole     REACTION: hives, mouth ulcers  . Moxifloxacin     REACTION: increased heart rate, nausea  . Nsaids     REACTION: difficulty breathing  . Phenergan [Promethazine Hcl]     "knocks" pt out for about 3 days  . Quetiapine     Somnolence. Slept for 36 hours straight.  . Sulfamethoxazole-Trimethoprim     REACTION: increased heart rate, nausea    Review of Systems  Review of Systems  Constitutional: Negative for fever and malaise/fatigue.  HENT: Negative for congestion.   Eyes: Negative for discharge.  Respiratory: Negative for shortness of breath.   Cardiovascular: Negative for chest pain, palpitations and leg swelling.  Gastrointestinal: Negative for nausea, abdominal pain and diarrhea.  Genitourinary: Negative for dysuria.  Musculoskeletal: Negative for falls.  Skin: Negative for rash.  Neurological: Negative for loss of consciousness and headaches.  Endo/Heme/Allergies: Negative for polydipsia.  Psychiatric/Behavioral: Negative for depression and suicidal ideas. The patient is not nervous/anxious and does not have insomnia.     Objective  BP 132/80  Pulse 71  Temp(Src) 98 F (36.7 C) (Oral)  Ht 5\' 7"  (1.702 m)  Wt 209 lb (94.802 kg)  BMI 32.73 kg/m2  SpO2 97%  Physical Exam  Physical Exam  Constitutional: She is oriented to person, place, and time and well-developed, well-nourished, and in no distress. No distress.  HENT:  Head: Normocephalic and atraumatic.  Eyes: Conjunctivae are normal.  Neck: Neck supple. No thyromegaly present.  Cardiovascular: Normal rate, regular rhythm and normal heart sounds.   No murmur heard. Pulmonary/Chest: Effort normal and breath sounds normal. She has no wheezes.  Abdominal: She exhibits no distension and no mass.  Musculoskeletal: She exhibits no edema.  Lymphadenopathy:    She has no cervical adenopathy.  Neurological: She is alert and oriented to person, place, and time.  Skin: Skin is warm and dry. No rash noted. She is not diaphoretic.  Psychiatric: Memory, affect and judgment normal.    Lab Results  Component Value Date   TSH 1.06 07/01/2012   Lab Results  Component Value Date   WBC 5.5 12/19/2012   HGB 12.0 12/19/2012   HCT 35.1* 12/19/2012   MCV 87.1 12/19/2012   PLT 221 12/19/2012   Lab Results  Component Value Date   CREATININE 0.87 12/19/2012   BUN 8 12/19/2012   NA 138 12/19/2012   K 4.1  12/19/2012   CL 102 12/19/2012   CO2 26 12/19/2012   Lab Results  Component Value Date   ALT 17 07/01/2012   AST 19 07/01/2012   ALKPHOS 93 07/01/2012   BILITOT 0.6 07/01/2012   Lab Results  Component Value Date   CHOL 175 07/01/2012   Lab Results  Component Value Date   HDL 34.80* 07/01/2012   No results found for this basename: [facility]   Lab Results  Component Value Date   TRIG 215.0* 07/01/2012   Lab Results  Component Value Date   CHOLHDL 5 07/01/2012     Assessment & Plan   HYPERTENSION Well controlled, no changes to meds. Encouraged heart healthy diet such as the DASH diet and exercise as tolerated.   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

## 2013-07-24 NOTE — Assessment & Plan Note (Signed)
Seen by rheumatology today, given shot of steroid in 3rd finger in right hand, is being started on MTX and hopefully will help her back and other pain as well

## 2013-07-24 NOTE — Assessment & Plan Note (Signed)
Well controlled, no changes to meds. Encouraged heart healthy diet such as the DASH diet and exercise as tolerated.  °

## 2013-07-24 NOTE — Progress Notes (Signed)
Pre visit review using our clinic review tool, if applicable. No additional management support is needed unless otherwise documented below in the visit note. 

## 2013-07-24 NOTE — Assessment & Plan Note (Signed)
Well controlled on Ranitidine and Loratadine twice a day

## 2013-07-24 NOTE — Assessment & Plan Note (Signed)
Doing much better on Full tab of zyprexa continue the same for now

## 2013-07-24 NOTE — Patient Instructions (Signed)
Bacterial Vaginosis Bacterial vaginosis is a vaginal infection that occurs when the normal balance of bacteria in the vagina is disrupted. It results from an overgrowth of certain bacteria. This is the most common vaginal infection in women of childbearing age. Treatment is important to prevent complications, especially in pregnant women, as it can cause a premature delivery. CAUSES  Bacterial vaginosis is caused by an increase in harmful bacteria that are normally present in smaller amounts in the vagina. Several different kinds of bacteria can cause bacterial vaginosis. However, the reason that the condition develops is not fully understood. RISK FACTORS Certain activities or behaviors can put you at an increased risk of developing bacterial vaginosis, including:  Having a new sex partner or multiple sex partners.  Douching.  Using an intrauterine device (IUD) for contraception. Women do not get bacterial vaginosis from toilet seats, bedding, swimming pools, or contact with objects around them. SIGNS AND SYMPTOMS  Some women with bacterial vaginosis have no signs or symptoms. Common symptoms include:  Grey vaginal discharge.  A fishlike odor with discharge, especially after sexual intercourse.  Itching or burning of the vagina and vulva.  Burning or pain with urination. DIAGNOSIS  Your health care provider will take a medical history and examine the vagina for signs of bacterial vaginosis. A sample of vaginal fluid may be taken. Your health care provider will look at this sample under a microscope to check for bacteria and abnormal cells. A vaginal pH test may also be done.  TREATMENT  Bacterial vaginosis may be treated with antibiotic medicines. These may be given in the form of a pill or a vaginal cream. A second round of antibiotics may be prescribed if the condition comes back after treatment.  HOME CARE INSTRUCTIONS   Only take over-the-counter or prescription medicines as  directed by your health care provider.  If antibiotic medicine was prescribed, take it as directed. Make sure you finish it even if you start to feel better.  Do not have sex until treatment is completed.  Tell all sexual partners that you have a vaginal infection. They should see their health care provider and be treated if they have problems, such as a mild rash or itching.  Practice safe sex by using condoms and only having one sex partner. SEEK MEDICAL CARE IF:   Your symptoms are not improving after 3 days of treatment.  You have increased discharge or pain.  You have a fever. MAKE SURE YOU:   Understand these instructions.  Will watch your condition.  Will get help right away if you are not doing well or get worse. FOR MORE INFORMATION  Centers for Disease Control and Prevention, Division of STD Prevention: www.cdc.gov/std American Sexual Health Association (ASHA): www.ashastd.org  Document Released: 01/01/2005 Document Revised: 10/22/2012 Document Reviewed: 08/13/2012 Exit[employer] Patient Information 2015 [employer]. This information is not intended to replace advice given to you by your health care provider. Make sure you discuss any questions you have with your health care provider.  

## 2013-07-24 NOTE — Assessment & Plan Note (Signed)
On Levothyroxine, continue to monitor 

## 2013-07-25 LAB — URINALYSIS
BILIRUBIN URINE: NEGATIVE
GLUCOSE, UA: NEGATIVE mg/dL
Hgb urine dipstick: NEGATIVE
Ketones, ur: NEGATIVE mg/dL
Nitrite: NEGATIVE
PH: 6.5 (ref 5.0–8.0)
PROTEIN: NEGATIVE mg/dL
Specific Gravity, Urine: 1.006 (ref 1.005–1.030)
Urobilinogen, UA: 0.2 mg/dL (ref 0.0–1.0)

## 2013-07-26 LAB — CULTURE, URINE COMPREHENSIVE
COLONY COUNT: NO GROWTH
Organism ID, Bacteria: NO GROWTH

## 2013-08-24 ENCOUNTER — Telehealth: Payer: Self-pay | Admitting: Family Medicine

## 2013-08-24 DIAGNOSIS — K219 Gastro-esophageal reflux disease without esophagitis: Principal | ICD-10-CM

## 2013-08-24 DIAGNOSIS — IMO0001 Reserved for inherently not codable concepts without codable children: Secondary | ICD-10-CM

## 2013-08-24 MED ORDER — RANITIDINE HCL 150 MG PO TABS: 150.0000 mg | ORAL_TABLET | Freq: Two times a day (BID) | ORAL | Status: DC

## 2013-08-24 NOTE — Telephone Encounter (Signed)
Refill- ranitidine  [facility]

## 2013-09-14 ENCOUNTER — Telehealth: Payer: Self-pay | Admitting: Internal Medicine

## 2013-09-14 NOTE — Telephone Encounter (Signed)
Left message for pt to call back.  Pt states she is having a terrible time with reflux and nothing she is taking is helping. Pt scheduled to see Dr. Henrene Pastor 09/15/13@10am . Pt aware of appt.

## 2013-09-15 ENCOUNTER — Encounter: Payer: Self-pay | Admitting: Internal Medicine

## 2013-09-15 ENCOUNTER — Ambulatory Visit (INDEPENDENT_AMBULATORY_CARE_PROVIDER_SITE_OTHER): Payer: Managed Care, Other (non HMO) | Admitting: Internal Medicine

## 2013-09-15 VITALS — BP 128/86 | HR 76 | Ht 67.0 in | Wt 208.0 lb

## 2013-09-15 DIAGNOSIS — K219 Gastro-esophageal reflux disease without esophagitis: Secondary | ICD-10-CM

## 2013-09-15 DIAGNOSIS — K589 Irritable bowel syndrome without diarrhea: Secondary | ICD-10-CM

## 2013-09-15 DIAGNOSIS — Z86010 Personal history of colonic polyps: Secondary | ICD-10-CM

## 2013-09-15 MED ORDER — PANTOPRAZOLE SODIUM 40 MG PO TBEC: 40.0000 mg | DELAYED_RELEASE_TABLET | Freq: Every day | ORAL | Status: DC

## 2013-09-15 NOTE — Progress Notes (Signed)
HISTORY OF PRESENT ILLNESS:  Joan Robinson is a 53 y.o. female with past medical history as listed below. She is followed in this office for IBS, remote peptic ulcer disease, recurrent Clostridium difficile diarrhea, adenomatous colon polyps, and GERD. I last saw her March 2014. Her last colonoscopy was March 2014 (no polyps). Followup in 5 years recommended (index exam 2003 with multiple and large adenomatous polyps). She presents today with a chief complaint of worsening reflux. About 4 months ago she noticed worsening reflux symptoms despite Prilosec 40 mg daily. She discontinued this and changed over-the-counter Nexium one daily. This did not help. She saw her PCP and was prescribed Carafate which she states "helps some". She also takes Zantac twice daily for hives and antacids on demand. Symptoms over the past few months have included nocturnal pyrosis with burning in the throat and years. Gurgling sounds. She states that she does elevate head of the bed. She is significantly over her baseline weight. No dysphagia. Other particular GI complaints at this time.  REVIEW OF SYSTEMS:  All non-GI ROS negative except for anxiety, back pain, visual change, fatigue, headaches, night sweats  Past Medical History  Diagnosis Date  . Allergic rhinitis   . Adenomatous colon polyp   . Diverticulosis   . Colitis, Clostridium difficile 5/11,6/11  . Hypothyroidism   . Urinary incontinence   . PUD (peptic ulcer disease)   . Tobacco abuse   . Hypertension   . Hyperlipidemia   . Colon polyp     benign  . Anemia 10/10/2011  . Preventative health care 10/10/2011  . Sinusitis acute 10/10/2011  . Chicken pox as a child    X 2  . Mumps as a child  . Lower back pain   . 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 Staph aureus infection 12/18/2011    Recurrent lesions in nares  . Perimenopausal 01/16/2012  . Anxiety and depression 01/17/2007    Qualifier: Diagnosis of  By: Loanne Drilling MD, Jacelyn Pi   . 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 Right knee pain 05/10/2012  . BCC (basal cell carcinoma of skin) 06/01/2012    Leg Follows with Dr Nevada Crane  . Headache(784.0) 08/17/2012  . Bipolar disorder 01/17/2007    Qualifier: Diagnosis of  By: Loanne Drilling MD, Jacelyn Pi   . 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 Arthritis 07/24/2013    Likely inflammatory and following with Dr Lynnda Shields of [facility]  rheumatology  . Diverticulosis     Past Surgical History  Procedure Laterality Date  . Tubal ligation  1995  . Uterine suspension      mesh  . Colonoscopy    . Gated spect wall motion stress cardiolite  11/05/2001  . Polypectomy    . Abdominal hysterectomy    . Abdominal hysterectomy  2011    complete  . Dilation and curettage of uterus  1985    Social History Joan Robinson  reports that she quit smoking about 4 years ago. Her smoking use included Cigarettes. She has a 30 pack-year smoking history. She has never used smokeless tobacco. She reports that she does not drink alcohol or use illicit drugs.  family history includes Aneurysm in her father; Arthritis in her sister; Colon cancer in her paternal uncle; Colon polyps in her father; Heart disease in her mother; Hyperlipidemia in her mother; Hypertension in her mother and sister; Irritable bowel syndrome in her daughter; Mental illness in her daughter and son; Other in her daughter, father, mother, sister, and sister;  Stroke in her mother.  Allergies  Allergen Reactions  . Keflex [Cephalexin] Diarrhea  . Aspirin     REACTION: difficulty breathing  . Dicyclomine Hcl     REACTION: mouth ulcers  . Metronidazole     REACTION: hives, mouth ulcers  . Moxifloxacin     REACTION: increased heart rate, nausea  . Nsaids     REACTION: difficulty breathing  . Phenergan [Promethazine Hcl]     "knocks" pt out for about 3 days  . Quetiapine     Somnolence. Slept for 36 hours straight.  . Sulfamethoxazole-Trimethoprim     REACTION: increased heart rate, nausea       PHYSICAL  EXAMINATION: Vital signs: BP 128/86  Pulse 76  Ht 5\' 7"  (1.702 m)  Wt 208 lb (94.348 kg)  BMI 32.57 kg/m2 General: Well-developed, well-nourished, no acute distress HEENT: Sclerae are anicteric, conjunctiva pink. Oral mucosa intact. No thrush Lungs: Clear Heart: Regular Abdomen: soft, obese, nontender, nondistended, no obvious ascites, no peritoneal signs, normal bowel sounds. No organomegaly. No succussion splash Extremities: No edema Psychiatric: alert and oriented x3. Cooperative   ASSESSMENT:  #1. GERD. Worsening symptoms in recent months. No longer on PPI #2. History of adenomatous colon polyps. Last colonoscopy March 2014 #3. Multiple medical problems  PLAN:  #1. Reflux precautions with attention to weight loss #2. Prescribe pantoprazole 40 mg daily. If this works, continue. If not, we might increase to twice daily dosage #3. Surveillance colonoscopy around 2019 #4. Ongoing general medical care with Dr. Charlett Blake

## 2013-09-15 NOTE — Patient Instructions (Signed)
We have sent the following medications to your pharmacy for you to pick up at your convenience: Protonix 40 mg, please take one tablet by mouth once daily  GERD information given today for you to follow

## 2013-09-28 ENCOUNTER — Other Ambulatory Visit: Payer: Self-pay | Admitting: Family Medicine

## 2013-09-28 ENCOUNTER — Ambulatory Visit (INDEPENDENT_AMBULATORY_CARE_PROVIDER_SITE_OTHER): Payer: Managed Care, Other (non HMO) | Admitting: Ophthalmology

## 2013-09-28 DIAGNOSIS — I10 Essential (primary) hypertension: Secondary | ICD-10-CM

## 2013-09-28 DIAGNOSIS — H35379 Puckering of macula, unspecified eye: Secondary | ICD-10-CM

## 2013-09-28 DIAGNOSIS — H35039 Hypertensive retinopathy, unspecified eye: Secondary | ICD-10-CM

## 2013-09-28 DIAGNOSIS — M5441 Lumbago with sciatica, right side: Secondary | ICD-10-CM

## 2013-09-28 DIAGNOSIS — H43819 Vitreous degeneration, unspecified eye: Secondary | ICD-10-CM

## 2013-09-28 MED ORDER — HYDROCODONE-ACETAMINOPHEN 10-325 MG PO TABS: 1.0000 | ORAL_TABLET | Freq: Four times a day (QID) | ORAL | Status: DC | PRN

## 2013-09-28 NOTE — Telephone Encounter (Signed)
Please advise refill?  Last RX was done on 07-24-13 quantity 70 with 0 refills

## 2013-09-28 NOTE — Telephone Encounter (Signed)
Caller name: Loni Relation to pt: self Call back number: [phone] Pharmacy:  Reason for call:    Patient is requesting a new hydrocodone rx

## 2013-09-29 MED ORDER — HYDROCODONE-ACETAMINOPHEN 10-325 MG PO TABS: 1.0000 | ORAL_TABLET | Freq: Four times a day (QID) | ORAL | Status: DC | PRN

## 2013-09-29 NOTE — Telephone Encounter (Signed)
RX reprinted- not sure where this printed

## 2013-09-29 NOTE — Addendum Note (Signed)
Addended by: Varney Daily on: 09/29/2013 08:55 AM   Modules accepted: Orders

## 2013-09-29 NOTE — Telephone Encounter (Signed)
Pt informed that RX is ready.

## 2013-10-08 ENCOUNTER — Ambulatory Visit (INDEPENDENT_AMBULATORY_CARE_PROVIDER_SITE_OTHER): Payer: Managed Care, Other (non HMO) | Admitting: Physician Assistant

## 2013-10-08 ENCOUNTER — Encounter: Payer: Self-pay | Admitting: Physician Assistant

## 2013-10-08 VITALS — BP 142/87 | HR 65 | Temp 97.9°F | Resp 16 | Ht 67.0 in | Wt 211.0 lb

## 2013-10-08 DIAGNOSIS — H6980 Other specified disorders of Eustachian tube, unspecified ear: Secondary | ICD-10-CM

## 2013-10-08 DIAGNOSIS — H9209 Otalgia, unspecified ear: Secondary | ICD-10-CM

## 2013-10-08 DIAGNOSIS — H9202 Otalgia, left ear: Secondary | ICD-10-CM | POA: Insufficient documentation

## 2013-10-08 DIAGNOSIS — H6982 Other specified disorders of Eustachian tube, left ear: Secondary | ICD-10-CM

## 2013-10-08 MED ORDER — METHYLPREDNISOLONE (PAK) 4 MG PO TABS: ORAL_TABLET | ORAL | Status: DC

## 2013-10-08 MED ORDER — ANTIPYRINE-BENZOCAINE 5.4-1.4 % OT SOLN: 3.0000 [drp] | OTIC | Status: DC | PRN

## 2013-10-08 NOTE — Assessment & Plan Note (Signed)
Rx Auralgan.  Topical Aspercreme.  Follow treatment for ETD discussed at today's visit.  Follow-up PRN.

## 2013-10-08 NOTE — Patient Instructions (Signed)
Your symptoms seem to be stemming from increased pressure behind the ear drum.Please take steroid as directed.  Stay well hydrated.  Use saline nasal spray daily.  Continue daily claritin.   Use Auralgan as directed for ear pain.  You may receive some benefit from applying a topical aspercreme around the ear. Please call if symptoms are not improving.

## 2013-10-08 NOTE — Progress Notes (Signed)
Patient presents to clinic today c/o left ear pain radiating into the jaw.  Denies known trauma or injury.  Denies dental pain or recent dental procedure.  Denies change to hearing or drainage from ear.  Denies fever, chills.  Endorses some mild nasal congestion over the past few days associated with PND.  Denies recent travel or sick contact.  Past Medical History  Diagnosis Date  . Allergic rhinitis   . Adenomatous colon polyp   . Diverticulosis   . Colitis, Clostridium difficile 5/11,6/11  . Hypothyroidism   . Urinary incontinence   . PUD (peptic ulcer disease)   . Tobacco abuse   . Hypertension   . Hyperlipidemia   . Colon polyp     benign  . Anemia 10/10/2011  . Preventative health care 10/10/2011  . Sinusitis acute 10/10/2011  . Chicken pox as a child    X 2  . Mumps as a child  . Lower back pain   . 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 Staph aureus infection 12/18/2011    Recurrent lesions in nares  . Perimenopausal 01/16/2012  . Anxiety and depression 01/17/2007    Qualifier: Diagnosis of  By: Loanne Drilling MD, Jacelyn Pi   . 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 Right knee pain 05/10/2012  . BCC (basal cell carcinoma of skin) 06/01/2012    Leg Follows with Dr Nevada Crane  . Headache(784.0) 08/17/2012  . Bipolar disorder 01/17/2007    Qualifier: Diagnosis of  By: Loanne Drilling MD, Jacelyn Pi   . 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 Arthritis 07/24/2013    Likely inflammatory and following with Dr Lynnda Shields of [facility]  rheumatology  . Diverticulosis     Current Outpatient Prescriptions on File Prior to Visit  Medication Sig Dispense Refill  . albuterol (PROVENTIL HFA;VENTOLIN HFA) 108 (90 BASE) MCG/ACT inhaler Inhale 2 puffs into the lungs every 6 (six) hours as needed for wheezing or shortness of breath.  1 Inhaler  0  . ALPRAZolam (XANAX) 0.25 MG tablet Take 1 tablet (0.25 mg total) by mouth 3 (three) times daily as needed for sleep or anxiety.  30 tablet  3  . amphetamine-dextroamphetamine (ADDERALL) 20 MG  tablet Take 1 tablet (20 mg total) by mouth 2 (two) times daily. September 2015  60 tablet  0  . calcium carbonate (TUMS E-X 750) 750 MG chewable tablet Chew 2 tablets by mouth daily.        Marland Kitchen HYDROcodone-acetaminophen (NORCO) 10-325 MG per tablet Take 1 tablet by mouth every 6 (six) hours as needed.  70 tablet  0  . levothyroxine (SYNTHROID, LEVOTHROID) 88 MCG tablet Take 1 tablet (88 mcg total) by mouth daily.  30 tablet  5  . Loperamide HCl (IMODIUM A-D PO) Take by mouth.      . loratadine (CLARITIN) 10 MG tablet Take 1 tablet (10 mg total) by mouth 2 (two) times daily as needed for allergies or itching (dermatitis).  30 tablet  5  . metoprolol tartrate (LOPRESSOR) 25 MG tablet Take 0.5 tablets (12.5 mg total) by mouth 2 (two) times daily.  45 tablet  3  . nitroGLYCERIN (NITROSTAT) 0.4 MG SL tablet Place 0.4 mg under the tongue every 5 (five) minutes as needed.        Marland Kitchen OLANZapine (ZYPREXA) 5 MG tablet Take 1 tablet (5 mg total) by mouth at bedtime.  30 tablet  2  . ondansetron (ZOFRAN) 4 MG tablet Take 1 tablet (4 mg total) by mouth every 6 (six) hours as  needed.  20 tablet  5  . pantoprazole (PROTONIX) 40 MG tablet Take 1 tablet (40 mg total) by mouth daily.  30 tablet  11  . Probiotic Product (PROBIOTIC FORMULA) CAPS Take 1 capsule by mouth daily.        . ranitidine (ZANTAC) 150 MG tablet Take 1 tablet (150 mg total) by mouth 2 (two) times daily.  180 tablet  1  . sucralfate (CARAFATE) 1 G tablet Take 1 tablet (1 g total) by mouth 4 (four) times daily -  with meals and at bedtime.  120 tablet  3   No current facility-administered medications on file prior to visit.    Allergies  Allergen Reactions  . Keflex [Cephalexin] Diarrhea  . Aspirin     REACTION: difficulty breathing  . Dicyclomine Hcl     REACTION: mouth ulcers  . Metronidazole     REACTION: hives, mouth ulcers  . Moxifloxacin     REACTION: increased heart rate, nausea  . Nsaids     REACTION: difficulty breathing  .  Phenergan [Promethazine Hcl]     "knocks" pt out for about 3 days  . Quetiapine     Somnolence. Slept for 36 hours straight.  . Sulfamethoxazole-Trimethoprim     REACTION: increased heart rate, nausea    Family History  Problem Relation Age of Onset  . Heart disease Mother     stents  . Stroke Mother   . Hyperlipidemia Mother   . Hypertension Mother   . Other Mother     blood disorder- mgus  . Colon polyps Father   . Other Father     aorta disection  . Aneurysm Father   . Colon cancer Paternal Uncle   . Irritable bowel syndrome Daughter   . Other Daughter     gastritis  . Mental illness Daughter     bipolar and mood disorder  . Hypertension Sister     ?  Marland Kitchen Mental illness Son     bipolar  . Arthritis Sister   . Other Sister     thyroid  . Other Sister     thyroid    History   Social History  . Marital Status: Married    Spouse Name: N/A    Number of Children: 2  . Years of Education: N/A   Occupational History  . 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 Smoking status: Former Smoker -- 1.00 packs/day for 30 years    Types: Cigarettes    Quit date: 05/15/2009  . Smokeless tobacco: Never Used  . Alcohol Use: No  . Drug Use: No  . Sexual Activity: No   Other Topics Concern  . None   Social History Narrative  . None   Review of Systems - See HPI.  All other ROS are negative.  BP 142/87  Pulse 65  Temp(Src) 97.9 F (36.6 C) (Oral)  Resp 16  Ht 5\' 7"  (1.702 m)  Wt 211 lb (95.709 kg)  BMI 33.04 kg/m2  SpO2 100%  Physical Exam  Vitals reviewed. Constitutional: She is oriented to person, place, and time and well-developed, well-nourished, and in no distress.  HENT:  Head: Normocephalic and atraumatic.  Right Ear: Tympanic membrane, external ear and ear canal normal.  Left Ear: External ear and ear canal normal. Tympanic membrane is retracted. A middle ear effusion is present.  Nose: Nose normal. Right sinus exhibits no maxillary  sinus tenderness and no frontal sinus tenderness. Left sinus  exhibits no maxillary sinus tenderness and no frontal sinus tenderness.  Mouth/Throat: Uvula is midline, oropharynx is clear and moist and mucous membranes are normal. No oropharyngeal exudate.  Eyes: Conjunctivae and EOM are normal. Pupils are equal, round, and reactive to light.  Neck: Neck supple.  Cardiovascular: Normal rate, regular rhythm, normal heart sounds and intact distal pulses.   Pulmonary/Chest: Effort normal and breath sounds normal. No respiratory distress. She has no wheezes. She has no rales. She exhibits no tenderness.  Lymphadenopathy:    She has no cervical adenopathy.  Neurological: She is alert and oriented to person, place, and time.  Skin: Skin is warm and dry. No rash noted.  Psychiatric: Affect normal.   Recent Results (from the past 2160 hour(s))  URINALYSIS     Status: Abnormal   Collection Time    07/24/13  3:08 PM      Result Value Ref Range   Color, Urine YELLOW  YELLOW   APPearance CLEAR  CLEAR   Specific Gravity, Urine 1.006  1.005 - 1.030   pH 6.5  5.0 - 8.0   Glucose, UA NEG  NEG mg/dL   Bilirubin Urine NEG  NEG   Ketones, ur NEG  NEG mg/dL   Hgb urine dipstick NEG  NEG   Protein, ur NEG  NEG mg/dL   Urobilinogen, UA 0.2  0.0 - 1.0 mg/dL   Nitrite NEG  NEG   Leukocytes, UA TRACE (*) NEG  CULTURE, URINE COMPREHENSIVE     Status: None   Collection Time    07/24/13  3:08 PM      Result Value Ref Range   Colony Count NO GROWTH     Organism ID, Bacteria NO GROWTH     Assessment/Plan: Eustachian tube dysfunction Continue Claritin twice daily.  Encouraged saline nasals spray.  Patient unable to tolerate nasal medications. Will Rx medrol dose pack to hopefully help relieve pressure and remove fluid.  Follow-up if symptoms are not improving.  Otalgia of left ear Rx Auralgan.  Topical Aspercreme.  Follow treatment for ETD discussed at today's visit.  Follow-up PRN.

## 2013-10-08 NOTE — Assessment & Plan Note (Signed)
Continue Claritin twice daily.  Encouraged saline nasals spray.  Patient unable to tolerate nasal medications. Will Rx medrol dose pack to hopefully help relieve pressure and remove fluid.  Follow-up if symptoms are not improving.

## 2013-10-08 NOTE — Progress Notes (Signed)
Pre visit review using our clinic review tool, if applicable. No additional management support is needed unless otherwise documented below in the visit note. 

## 2013-10-09 ENCOUNTER — Telehealth: Payer: Self-pay | Admitting: Family Medicine

## 2013-10-09 NOTE — Telephone Encounter (Signed)
Caller name: Aleathia  Relation to pt: self  Call back number: [phone]   Reason for call: pt states OLANZapine (ZYPREXA) 5 MG tablet is not working requesting a new medication

## 2013-10-10 NOTE — Telephone Encounter (Signed)
I need a visit for this to discuss options and what is happening

## 2013-10-12 NOTE — Telephone Encounter (Signed)
Please call and inform pt of mds message and schedule appt

## 2013-10-12 NOTE — Telephone Encounter (Signed)
Appointment scheduled for 10/23/13

## 2013-10-21 NOTE — Telephone Encounter (Signed)
FYI: Appointment scheduled for 10/23/13  Now have [employer] discount voucher & savings card options for patient/SLS

## 2013-10-23 ENCOUNTER — Ambulatory Visit: Payer: Managed Care, Other (non HMO) | Admitting: Family Medicine

## 2013-10-27 ENCOUNTER — Encounter: Payer: Self-pay | Admitting: Family Medicine

## 2013-10-27 ENCOUNTER — Ambulatory Visit (INDEPENDENT_AMBULATORY_CARE_PROVIDER_SITE_OTHER): Payer: Managed Care, Other (non HMO) | Admitting: Family Medicine

## 2013-10-27 VITALS — BP 116/77 | HR 78 | Temp 98.2°F | Ht 67.0 in | Wt 212.8 lb

## 2013-10-27 DIAGNOSIS — G43809 Other migraine, not intractable, without status migrainosus: Secondary | ICD-10-CM

## 2013-10-27 DIAGNOSIS — H409 Unspecified glaucoma: Secondary | ICD-10-CM

## 2013-10-27 DIAGNOSIS — Q134 Other congenital corneal malformations: Secondary | ICD-10-CM

## 2013-10-27 DIAGNOSIS — H6992 Unspecified Eustachian tube disorder, left ear: Secondary | ICD-10-CM

## 2013-10-27 DIAGNOSIS — L508 Other urticaria: Secondary | ICD-10-CM

## 2013-10-27 DIAGNOSIS — I10 Essential (primary) hypertension: Secondary | ICD-10-CM

## 2013-10-27 DIAGNOSIS — H6982 Other specified disorders of Eustachian tube, left ear: Secondary | ICD-10-CM

## 2013-10-27 DIAGNOSIS — E669 Obesity, unspecified: Secondary | ICD-10-CM

## 2013-10-27 DIAGNOSIS — K219 Gastro-esophageal reflux disease without esophagitis: Secondary | ICD-10-CM

## 2013-10-27 DIAGNOSIS — F3170 Bipolar disorder, currently in remission, most recent episode unspecified: Secondary | ICD-10-CM

## 2013-10-27 DIAGNOSIS — E785 Hyperlipidemia, unspecified: Secondary | ICD-10-CM

## 2013-10-27 DIAGNOSIS — E039 Hypothyroidism, unspecified: Secondary | ICD-10-CM

## 2013-10-27 DIAGNOSIS — J209 Acute bronchitis, unspecified: Secondary | ICD-10-CM

## 2013-10-27 MED ORDER — BENZONATATE 100 MG PO CAPS: 100.0000 mg | ORAL_CAPSULE | Freq: Three times a day (TID) | ORAL | Status: DC | PRN

## 2013-10-27 MED ORDER — PHENTERMINE-TOPIRAMATE ER 3.75-23 MG PO CP24: 1.0000 | ORAL_CAPSULE | Freq: Every day | ORAL | Status: DC

## 2013-10-27 MED ORDER — PHENTERMINE-TOPIRAMATE ER 7.5-46 MG PO CP24: 1.0000 | ORAL_CAPSULE | Freq: Every day | ORAL | Status: DC

## 2013-10-27 MED ORDER — METHYLPREDNISOLONE (PAK) 4 MG PO TABS: ORAL_TABLET | ORAL | Status: DC

## 2013-10-27 MED ORDER — AZITHROMYCIN 250 MG PO TABS: ORAL_TABLET | ORAL | Status: DC

## 2013-10-27 MED ORDER — SUMATRIPTAN SUCCINATE 50 MG PO TABS: 50.0000 mg | ORAL_TABLET | ORAL | Status: DC | PRN

## 2013-10-27 NOTE — Progress Notes (Signed)
Pre visit review using our clinic review tool, if applicable. No additional management support is needed unless otherwise documented below in the visit note. 

## 2013-10-28 LAB — HEPATIC FUNCTION PANEL
ALK PHOS: 92 U/L (ref 39–117)
ALT: 22 U/L (ref 0–35)
AST: 23 U/L (ref 0–37)
Albumin: 3.7 g/dL (ref 3.5–5.2)
BILIRUBIN DIRECT: 0.1 mg/dL (ref 0.0–0.3)
TOTAL PROTEIN: 7.5 g/dL (ref 6.0–8.3)
Total Bilirubin: 0.5 mg/dL (ref 0.2–1.2)

## 2013-10-28 LAB — CBC
HCT: 35.1 % — ABNORMAL LOW (ref 36.0–46.0)
Hemoglobin: 11.6 g/dL — ABNORMAL LOW (ref 12.0–15.0)
MCHC: 33 g/dL (ref 30.0–36.0)
MCV: 88.7 fl (ref 78.0–100.0)
PLATELETS: 206 10*3/uL (ref 150.0–400.0)
RBC: 3.96 Mil/uL (ref 3.87–5.11)
RDW: 12.7 % (ref 11.5–15.5)
WBC: 6.1 10*3/uL (ref 4.0–10.5)

## 2013-10-28 LAB — RENAL FUNCTION PANEL
ALBUMIN: 3.7 g/dL (ref 3.5–5.2)
BUN: 6 mg/dL (ref 6–23)
CHLORIDE: 105 meq/L (ref 96–112)
CO2: 31 mEq/L (ref 19–32)
Calcium: 9.1 mg/dL (ref 8.4–10.5)
Creatinine, Ser: 1 mg/dL (ref 0.4–1.2)
GFR: 60.28 mL/min (ref >60.00)
Glucose, Bld: 97 mg/dL (ref 70–99)
Phosphorus: 3.2 mg/dL (ref 2.3–4.6)
Potassium: 4.1 mEq/L (ref 3.5–5.1)
Sodium: 141 mEq/L (ref 135–145)

## 2013-10-28 LAB — LIPID PANEL
CHOLESTEROL: 212 mg/dL — AB (ref 0–200)
HDL: 32.2 mg/dL — ABNORMAL LOW (ref >39.00)
NonHDL: 179.8
TRIGLYCERIDES: 303 mg/dL — AB (ref 0.0–149.0)
Total CHOL/HDL Ratio: 7
VLDL: 60.6 mg/dL — AB (ref 0.0–40.0)

## 2013-10-28 LAB — TSH: TSH: 19.1 u[IU]/mL — AB (ref 0.35–4.50)

## 2013-10-28 LAB — LDL CHOLESTEROL, DIRECT: Direct LDL: 122.3 mg/dL

## 2013-10-29 MED ORDER — LEVOTHYROXINE SODIUM 100 MCG PO TABS: 100.0000 ug | ORAL_TABLET | Freq: Every day | ORAL | Status: DC

## 2013-11-01 ENCOUNTER — Encounter: Payer: Self-pay | Admitting: Family Medicine

## 2013-11-01 DIAGNOSIS — H409 Unspecified glaucoma: Secondary | ICD-10-CM

## 2013-11-01 DIAGNOSIS — E669 Obesity, unspecified: Secondary | ICD-10-CM

## 2013-11-01 DIAGNOSIS — Q134 Other congenital corneal malformations: Secondary | ICD-10-CM

## 2013-11-01 DIAGNOSIS — J209 Acute bronchitis, unspecified: Secondary | ICD-10-CM | POA: Insufficient documentation

## 2013-11-01 HISTORY — DX: Other congenital corneal malformations: Q13.4

## 2013-11-01 HISTORY — DX: Obesity, unspecified: E66.9

## 2013-11-01 HISTORY — DX: Unspecified glaucoma: H40.9

## 2013-11-01 NOTE — Progress Notes (Signed)
Patient ID: Joan Robinson, female   DOB: July 18, 1960, 53 y.o.   MRN: [id number] Joan Robinson [id number] [birth year] 11/01/2013      Progress Note-Follow Up  Subjective  Chief Complaint  Chief Complaint  Patient presents with  . Follow-up    discuss medications  . Cough    X 2 weeks, phlegm brown    HPI  Patient is a 53 year old female in today for routine medical care. Patient is in today to discuss medications. She is started with [place] [facility] to 2 a worsening of her symptoms. Has been started on Cymbalta which has not caused her any difficulty but she's not sure he seen. She struggling with a cough for the past 2 weeks productive of brown phlegm. Chills malaise and myalgias. Head congestion is also noted. No ear pain or sore throat. Denies CP/palp/SOB/HA/congestion/fevers/GI or GU c/o. Taking meds as prescribed  Past Medical History  Diagnosis Date  . Allergic rhinitis   . Adenomatous colon polyp   . Diverticulosis   . Colitis, Clostridium difficile 5/11,6/11  . Hypothyroidism   . Urinary incontinence   . PUD (peptic ulcer disease)   . Tobacco abuse   . Hypertension   . Hyperlipidemia   . Colon polyp     benign  . Anemia 10/10/2011  . Preventative health care 10/10/2011  . Sinusitis acute 10/10/2011  . Chicken pox as a child    X 2  . Mumps as a child  . Lower back pain   . 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 Staph aureus infection 12/18/2011    Recurrent lesions in nares  . Perimenopausal 01/16/2012  . Anxiety and depression 01/17/2007    Qualifier: Diagnosis of  By: Everardo All MD, Cleophas Dunker   . 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 Right knee pain 05/10/2012  . BCC (basal cell carcinoma of skin) 06/01/2012    Leg Follows with Dr Margo Aye  . Headache(784.0) 08/17/2012  . Bipolar disorder 01/17/2007    Qualifier: Diagnosis of  By: Everardo All MD, Cleophas Dunker   . 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 Arthritis 07/24/2013    Likely inflammatory and following with Dr Maryln Gottron of [facility]  rheumatology   . Diverticulosis   . Hypothyroidism 08/24/2006    Qualifier: Diagnosis of  By: Everardo All MD, Cleophas Dunker      Past Surgical History  Procedure Laterality Date  . Tubal ligation  1995  . Uterine suspension      mesh  . Colonoscopy    . Gated spect wall motion stress cardiolite  11/05/2001  . Polypectomy    . Abdominal hysterectomy    . Abdominal hysterectomy  2011    complete  . Dilation and curettage of uterus  1985    Family History  Problem Relation Age of Onset  . Heart disease Mother     stents  . Stroke Mother   . Hyperlipidemia Mother   . Hypertension Mother   . Other Mother     blood disorder- mgus  . Colon polyps Father   . Other Father     aorta disection  . Aneurysm Father   . Colon cancer Paternal Uncle   . Irritable bowel syndrome Daughter   . Other Daughter     gastritis  . Mental illness Daughter     bipolar and mood disorder  . Hypertension Sister     ?  Marland Kitchen Mental illness Son     bipolar  . Arthritis Sister   . Other  Sister     thyroid  . Other Sister     thyroid    History   Social History  . Marital Status: Married    Spouse Name: N/A    Number of Children: 2  . Years of Education: N/A   Occupational History  . 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 Smoking status: Former Smoker -- 1.00 packs/day for 30 years    Types: Cigarettes    Quit date: 05/15/2009  . Smokeless tobacco: Never Used  . Alcohol Use: No  . Drug Use: No  . Sexual Activity: No   Other Topics Concern  . Not on file   Social History Narrative  . No narrative on file    Current Outpatient Prescriptions on File Prior to Visit  Medication Sig Dispense Refill  . albuterol (PROVENTIL HFA;VENTOLIN HFA) 108 (90 BASE) MCG/ACT inhaler Inhale 2 puffs into the lungs every 6 (six) hours as needed for wheezing or shortness of breath.  1 Inhaler  0  . ALPRAZolam (XANAX) 0.25 MG tablet Take 1 tablet (0.25 mg total) by mouth 3 (three) times daily as needed for sleep  or anxiety.  30 tablet  3  . amphetamine-dextroamphetamine (ADDERALL) 20 MG tablet Take 1 tablet (20 mg total) by mouth 2 (two) times daily. September 2015  60 tablet  0  . calcium carbonate (TUMS E-X 750) 750 MG chewable tablet Chew 2 tablets by mouth daily.        Marland Kitchen HYDROcodone-acetaminophen (NORCO) 10-325 MG per tablet Take 1 tablet by mouth every 6 (six) hours as needed.  70 tablet  0  . Loperamide HCl (IMODIUM A-D PO) Take by mouth.      . loratadine (CLARITIN) 10 MG tablet Take 1 tablet (10 mg total) by mouth 2 (two) times daily as needed for allergies or itching (dermatitis).  30 tablet  5  . metoprolol tartrate (LOPRESSOR) 25 MG tablet Take 0.5 tablets (12.5 mg total) by mouth 2 (two) times daily.  45 tablet  3  . nitroGLYCERIN (NITROSTAT) 0.4 MG SL tablet Place 0.4 mg under the tongue every 5 (five) minutes as needed.        Marland Kitchen OLANZapine (ZYPREXA) 5 MG tablet Take 1 tablet (5 mg total) by mouth at bedtime.  30 tablet  2  . ondansetron (ZOFRAN) 4 MG tablet Take 1 tablet (4 mg total) by mouth every 6 (six) hours as needed.  20 tablet  5  . pantoprazole (PROTONIX) 40 MG tablet Take 1 tablet (40 mg total) by mouth daily.  30 tablet  11  . Probiotic Product (PROBIOTIC FORMULA) CAPS Take 1 capsule by mouth daily.        . ranitidine (ZANTAC) 150 MG tablet Take 1 tablet (150 mg total) by mouth 2 (two) times daily.  180 tablet  1  . sucralfate (CARAFATE) 1 G tablet Take 1 tablet (1 g total) by mouth 4 (four) times daily -  with meals and at bedtime.  120 tablet  3   No current facility-administered medications on file prior to visit.    Allergies  Allergen Reactions  . Keflex [Cephalexin] Diarrhea  . Aspirin     REACTION: difficulty breathing  . Dicyclomine Hcl     REACTION: mouth ulcers  . Metronidazole     REACTION: hives, mouth ulcers  . Moxifloxacin     REACTION: increased heart rate, nausea  . Nsaids     REACTION: difficulty breathing  . Phenergan [  Promethazine Hcl]     "knocks"  pt out for about 3 days  . Quetiapine     Somnolence. Slept for 36 hours straight.  . Sulfamethoxazole-Trimethoprim     REACTION: increased heart rate, nausea    Review of Systems  Review of Systems  Constitutional: Positive for malaise/fatigue. Negative for fever.  HENT: Positive for congestion.   Eyes: Negative for discharge.  Respiratory: Positive for cough. Negative for shortness of breath.   Cardiovascular: Positive for palpitations. Negative for chest pain and leg swelling.  Gastrointestinal: Negative for nausea, abdominal pain and diarrhea.  Genitourinary: Negative for dysuria.  Musculoskeletal: Positive for myalgias. Negative for falls.  Skin: Negative for rash.  Neurological: Negative for loss of consciousness and headaches.  Endo/Heme/Allergies: Negative for polydipsia.  Psychiatric/Behavioral: Positive for depression. Negative for suicidal ideas. The patient is nervous/anxious and has insomnia.     Objective  BP 116/77  Pulse 78  Temp(Src) 98.2 F (36.8 C) (Oral)  Ht 5\' 7"  (1.702 m)  Wt 212 lb 12.8 oz (96.525 kg)  BMI 33.32 kg/m2  SpO2 96%  Physical Exam  Physical Exam  Constitutional: She is oriented to person, place, and time and well-developed, well-nourished, and in no distress. No distress.  HENT:  Head: Normocephalic and atraumatic.  Eyes: Conjunctivae are normal.  Neck: Neck supple. No thyromegaly present.  Cardiovascular: Normal rate, regular rhythm and normal heart sounds.   No murmur heard. Pulmonary/Chest: Effort normal. She has wheezes.  Abdominal: She exhibits no distension and no mass.  Musculoskeletal: She exhibits no edema.  Lymphadenopathy:    She has no cervical adenopathy.  Neurological: She is alert and oriented to person, place, and time.  Skin: Skin is warm and dry. No rash noted. She is not diaphoretic.  Psychiatric: Memory, affect and judgment normal.    Lab Results  Component Value Date   TSH 19.10* 10/27/2013   Lab  Results  Component Value Date   WBC 6.1 10/27/2013   HGB 11.6* 10/27/2013   HCT 35.1* 10/27/2013   MCV 88.7 10/27/2013   PLT 206.0 10/27/2013   Lab Results  Component Value Date   CREATININE 1.0 10/27/2013   BUN 6 10/27/2013   NA 141 10/27/2013   K 4.1 10/27/2013   CL 105 10/27/2013   CO2 31 10/27/2013   Lab Results  Component Value Date   ALT 22 10/27/2013   AST 23 10/27/2013   ALKPHOS 92 10/27/2013   BILITOT 0.5 10/27/2013   Lab Results  Component Value Date   CHOL 212* 10/27/2013   Lab Results  Component Value Date   HDL 32.20* 10/27/2013   No results found for this basename: [facility]   Lab Results  Component Value Date   TRIG 303.0* 10/27/2013   Lab Results  Component Value Date   CHOLHDL 7 10/27/2013     Assessment & Plan  Bipolar disorder Has established with [facility] in [place], Dr Nigel Mormon. Has been started on Cymbalta 20 mg daily, did not tolerate Zyprexa.   Essential hypertension Well controlled, no changes to meds. Encouraged heart healthy diet such as the DASH diet and exercise as tolerated.   Hypothyroidism TSH elevated, Levothyroxine was increased and she believes she is feeling better already.  GERD Avoid offending foods, start probiotics. Do not eat large meals in late evening and consider raising head of bed.   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 Encouraged increased rest and  hydration, add probiotics, zinc such as Coldeze or Xicam. Treat fevers as needed  Obesity Encouraged DASH diet, decrease po intake and increase exercise as tolerated. Needs 7-8 hours of sleep nightly. Avoid trans fats, eat small, frequent meals every 4-5 hours with lean proteins, complex carbs and healthy fats. Minimize simple carbs, has already used Phentermine, will try Qsymia. Given rx

## 2013-11-01 NOTE — Assessment & Plan Note (Signed)
TSH elevated, Levothyroxine was increased and she believes she is feeling better already.

## 2013-11-01 NOTE — Assessment & Plan Note (Signed)
Avoid offending foods, start probiotics. Do not eat large meals in late evening and consider raising head of bed.  

## 2013-11-01 NOTE — Assessment & Plan Note (Signed)
Encouraged DASH diet, decrease po intake and increase exercise as tolerated. Needs 7-8 hours of sleep nightly. Avoid trans fats, eat small, frequent meals every 4-5 hours with lean proteins, complex carbs and healthy fats. Minimize simple carbs, has already used Phentermine, will try Qsymia. Given rx

## 2013-11-01 NOTE — Assessment & Plan Note (Signed)
Has established with [facility] in [place], Dr Charlton Haws. Has been started on Cymbalta 20 mg daily, did not tolerate Zyprexa.

## 2013-11-01 NOTE — Assessment & Plan Note (Signed)
Improved on current medical treatments

## 2013-11-01 NOTE — Assessment & Plan Note (Addendum)
Started on antibiotics and steroids. Encouraged increased rest and hydration, add probiotics, zinc such as Coldeze or Xicam. Treat fevers as needed

## 2013-11-01 NOTE — Assessment & Plan Note (Signed)
Well controlled, no changes to meds. Encouraged heart healthy diet such as the DASH diet and exercise as tolerated.  °

## 2013-11-20 ENCOUNTER — Telehealth: Payer: Self-pay | Admitting: Family Medicine

## 2013-11-20 DIAGNOSIS — M5441 Lumbago with sciatica, right side: Secondary | ICD-10-CM

## 2013-11-20 NOTE — Telephone Encounter (Signed)
Caller name: Dacie Relation to pt: self Call back number: [phone] Pharmacy:  Reason for call:   Patient is requesting a new hydrocodone rx

## 2013-11-20 NOTE — Telephone Encounter (Signed)
Requesting Hydrocodone 10-325mg -Take 1 table by mouth every 6 hours as needed. Last refill:09/29/13;#70,0 Last OV:10/27/13 Please advise.//AB/CMA

## 2013-11-21 NOTE — Telephone Encounter (Signed)
OK to refill Hydrocodone same strength, same number

## 2013-11-23 MED ORDER — HYDROCODONE-ACETAMINOPHEN 10-325 MG PO TABS: 1.0000 | ORAL_TABLET | Freq: Four times a day (QID) | ORAL | Status: DC | PRN

## 2013-11-23 NOTE — Telephone Encounter (Signed)
RX printed and message left on patients voicemail

## 2013-11-26 ENCOUNTER — Telehealth: Payer: Self-pay | Admitting: Family Medicine

## 2013-11-26 NOTE — Telephone Encounter (Signed)
She can have Doxycycline 100 mg po bid x 10 days

## 2013-11-26 NOTE — Telephone Encounter (Signed)
Can we get this triaged please?

## 2013-11-26 NOTE — Telephone Encounter (Signed)
She is stopped up again cough congestion  She needs to try something different

## 2013-11-26 NOTE — Telephone Encounter (Signed)
Deep coughing, sputum is yellow and blood tinged, nasal and head congestion, ear pain bilateral.  Finished abx approx 3 weeks ago (Z-pak). Please advise.

## 2013-11-27 MED ORDER — DOXYCYCLINE HYCLATE 100 MG PO TABS: 100.0000 mg | ORAL_TABLET | Freq: Two times a day (BID) | ORAL | Status: DC

## 2013-11-27 NOTE — Telephone Encounter (Signed)
Left a detailed message for patient and sent RX to pharmacy

## 2013-12-01 ENCOUNTER — Ambulatory Visit: Payer: Managed Care, Other (non HMO) | Admitting: Family Medicine

## 2013-12-15 ENCOUNTER — Ambulatory Visit: Payer: Managed Care, Other (non HMO) | Admitting: Family Medicine

## 2013-12-18 ENCOUNTER — Other Ambulatory Visit: Payer: Self-pay

## 2013-12-18 DIAGNOSIS — IMO0001 Reserved for inherently not codable concepts without codable children: Secondary | ICD-10-CM

## 2013-12-18 DIAGNOSIS — K219 Gastro-esophageal reflux disease without esophagitis: Principal | ICD-10-CM

## 2013-12-18 MED ORDER — RANITIDINE HCL 150 MG PO TABS: 150.0000 mg | ORAL_TABLET | Freq: Two times a day (BID) | ORAL | Status: DC

## 2013-12-22 ENCOUNTER — Ambulatory Visit: Payer: Managed Care, Other (non HMO) | Admitting: Family Medicine

## 2013-12-29 DIAGNOSIS — H5213 Myopia, bilateral: Secondary | ICD-10-CM | POA: Insufficient documentation

## 2013-12-29 DIAGNOSIS — H401234 Low-tension glaucoma, bilateral, indeterminate stage: Secondary | ICD-10-CM | POA: Insufficient documentation

## 2013-12-29 DIAGNOSIS — H35413 Lattice degeneration of retina, bilateral: Secondary | ICD-10-CM | POA: Insufficient documentation

## 2014-01-04 ENCOUNTER — Encounter: Payer: Managed Care, Other (non HMO) | Admitting: Family Medicine

## 2014-01-05 ENCOUNTER — Ambulatory Visit (INDEPENDENT_AMBULATORY_CARE_PROVIDER_SITE_OTHER): Payer: Managed Care, Other (non HMO) | Admitting: Family Medicine

## 2014-01-05 VITALS — BP 143/78 | HR 77 | Temp 98.7°F | Resp 14 | Ht 66.0 in | Wt 209.4 lb

## 2014-01-05 DIAGNOSIS — F988 Other specified behavioral and emotional disorders with onset usually occurring in childhood and adolescence: Secondary | ICD-10-CM

## 2014-01-05 DIAGNOSIS — F3170 Bipolar disorder, currently in remission, most recent episode unspecified: Secondary | ICD-10-CM

## 2014-01-05 DIAGNOSIS — K649 Unspecified hemorrhoids: Secondary | ICD-10-CM

## 2014-01-05 DIAGNOSIS — F909 Attention-deficit hyperactivity disorder, unspecified type: Secondary | ICD-10-CM

## 2014-01-05 DIAGNOSIS — M5441 Lumbago with sciatica, right side: Secondary | ICD-10-CM

## 2014-01-05 DIAGNOSIS — R1013 Epigastric pain: Secondary | ICD-10-CM

## 2014-01-05 DIAGNOSIS — E782 Mixed hyperlipidemia: Secondary | ICD-10-CM

## 2014-01-05 DIAGNOSIS — E669 Obesity, unspecified: Secondary | ICD-10-CM

## 2014-01-05 DIAGNOSIS — F329 Major depressive disorder, single episode, unspecified: Secondary | ICD-10-CM

## 2014-01-05 DIAGNOSIS — I10 Essential (primary) hypertension: Secondary | ICD-10-CM

## 2014-01-05 DIAGNOSIS — E039 Hypothyroidism, unspecified: Secondary | ICD-10-CM

## 2014-01-05 DIAGNOSIS — F32A Depression, unspecified: Secondary | ICD-10-CM

## 2014-01-05 DIAGNOSIS — K219 Gastro-esophageal reflux disease without esophagitis: Secondary | ICD-10-CM

## 2014-01-05 MED ORDER — HYDROCORTISONE ACETATE 25 MG RE SUPP: 25.0000 mg | Freq: Every evening | RECTAL | Status: DC | PRN

## 2014-01-05 MED ORDER — AMPHETAMINE-DEXTROAMPHETAMINE 20 MG PO TABS: 20.0000 mg | ORAL_TABLET | Freq: Two times a day (BID) | ORAL | Status: DC

## 2014-01-05 MED ORDER — SUCRALFATE 1 G PO TABS: 1.0000 g | ORAL_TABLET | Freq: Three times a day (TID) | ORAL | Status: DC

## 2014-01-05 MED ORDER — ALPRAZOLAM 0.25 MG PO TABS: 0.2500 mg | ORAL_TABLET | Freq: Three times a day (TID) | ORAL | Status: DC | PRN

## 2014-01-05 MED ORDER — DULOXETINE HCL 20 MG PO CPEP: 20.0000 mg | ORAL_CAPSULE | Freq: Every day | ORAL | Status: DC

## 2014-01-05 MED ORDER — HYDROCODONE-ACETAMINOPHEN 10-325 MG PO TABS: 1.0000 | ORAL_TABLET | Freq: Four times a day (QID) | ORAL | Status: DC | PRN

## 2014-01-05 MED ORDER — METOPROLOL TARTRATE 25 MG PO TABS: 12.5000 mg | ORAL_TABLET | Freq: Two times a day (BID) | ORAL | Status: DC

## 2014-01-05 NOTE — Patient Instructions (Signed)

## 2014-01-05 NOTE — Progress Notes (Signed)
Pre visit review using our clinic review tool, if applicable. No additional management support is needed unless otherwise documented below in the visit note. 

## 2014-01-06 ENCOUNTER — Emergency Department (HOSPITAL_BASED_OUTPATIENT_CLINIC_OR_DEPARTMENT_OTHER)
Admission: EM | Admit: 2014-01-06 | Discharge: 2014-01-07 | Disposition: A | Discharge status: Home / Self Care | Payer: Managed Care, Other (non HMO) | Attending: Emergency Medicine | Admitting: Emergency Medicine

## 2014-01-06 ENCOUNTER — Emergency Department (HOSPITAL_BASED_OUTPATIENT_CLINIC_OR_DEPARTMENT_OTHER): Payer: Managed Care, Other (non HMO)

## 2014-01-06 ENCOUNTER — Encounter (HOSPITAL_BASED_OUTPATIENT_CLINIC_OR_DEPARTMENT_OTHER): Payer: Self-pay | Admitting: Emergency Medicine

## 2014-01-06 DIAGNOSIS — S93401A Sprain of unspecified ligament of right ankle, initial encounter: Secondary | ICD-10-CM | POA: Insufficient documentation

## 2014-01-06 DIAGNOSIS — Y9301 Activity, walking, marching and hiking: Secondary | ICD-10-CM | POA: Insufficient documentation

## 2014-01-06 DIAGNOSIS — Y9289 Other specified places as the place of occurrence of the external cause: Secondary | ICD-10-CM | POA: Diagnosis not present

## 2014-01-06 DIAGNOSIS — Z8619 Personal history of other infectious and parasitic diseases: Secondary | ICD-10-CM | POA: Insufficient documentation

## 2014-01-06 DIAGNOSIS — Z872 Personal history of diseases of the skin and subcutaneous tissue: Secondary | ICD-10-CM | POA: Insufficient documentation

## 2014-01-06 DIAGNOSIS — S93402A Sprain of unspecified ligament of left ankle, initial encounter: Secondary | ICD-10-CM | POA: Insufficient documentation

## 2014-01-06 DIAGNOSIS — Z7952 Long term (current) use of systemic steroids: Secondary | ICD-10-CM | POA: Diagnosis not present

## 2014-01-06 DIAGNOSIS — E039 Hypothyroidism, unspecified: Secondary | ICD-10-CM | POA: Insufficient documentation

## 2014-01-06 DIAGNOSIS — Z85828 Personal history of other malignant neoplasm of skin: Secondary | ICD-10-CM | POA: Insufficient documentation

## 2014-01-06 DIAGNOSIS — F419 Anxiety disorder, unspecified: Secondary | ICD-10-CM | POA: Insufficient documentation

## 2014-01-06 DIAGNOSIS — Z86010 Personal history of colonic polyps: Secondary | ICD-10-CM | POA: Insufficient documentation

## 2014-01-06 DIAGNOSIS — I10 Essential (primary) hypertension: Secondary | ICD-10-CM | POA: Diagnosis not present

## 2014-01-06 DIAGNOSIS — W108XXA Fall (on) (from) other stairs and steps, initial encounter: Secondary | ICD-10-CM | POA: Insufficient documentation

## 2014-01-06 DIAGNOSIS — Q134 Other congenital corneal malformations: Secondary | ICD-10-CM | POA: Insufficient documentation

## 2014-01-06 DIAGNOSIS — E669 Obesity, unspecified: Secondary | ICD-10-CM | POA: Diagnosis not present

## 2014-01-06 DIAGNOSIS — Z8711 Personal history of peptic ulcer disease: Secondary | ICD-10-CM | POA: Diagnosis not present

## 2014-01-06 DIAGNOSIS — M1990 Unspecified osteoarthritis, unspecified site: Secondary | ICD-10-CM | POA: Insufficient documentation

## 2014-01-06 DIAGNOSIS — Y998 Other external cause status: Secondary | ICD-10-CM | POA: Insufficient documentation

## 2014-01-06 DIAGNOSIS — Z79899 Other long term (current) drug therapy: Secondary | ICD-10-CM | POA: Diagnosis not present

## 2014-01-06 DIAGNOSIS — H409 Unspecified glaucoma: Secondary | ICD-10-CM | POA: Diagnosis not present

## 2014-01-06 DIAGNOSIS — Z862 Personal history of diseases of the blood and blood-forming organs and certain disorders involving the immune mechanism: Secondary | ICD-10-CM | POA: Diagnosis not present

## 2014-01-06 DIAGNOSIS — Z87891 Personal history of nicotine dependence: Secondary | ICD-10-CM | POA: Insufficient documentation

## 2014-01-06 DIAGNOSIS — S99911A Unspecified injury of right ankle, initial encounter: Secondary | ICD-10-CM | POA: Diagnosis present

## 2014-01-06 DIAGNOSIS — F319 Bipolar disorder, unspecified: Secondary | ICD-10-CM | POA: Insufficient documentation

## 2014-01-06 DIAGNOSIS — W19XXXA Unspecified fall, initial encounter: Secondary | ICD-10-CM

## 2014-01-06 LAB — CBC
HCT: 38 % (ref 36.0–46.0)
Hemoglobin: 12.6 g/dL (ref 12.0–15.0)
MCHC: 33.2 g/dL (ref 30.0–36.0)
MCV: 89.1 fl (ref 78.0–100.0)
Platelets: 256 10*3/uL (ref 150.0–400.0)
RBC: 4.26 Mil/uL (ref 3.87–5.11)
RDW: 12.8 % (ref 11.5–15.5)
WBC: 6.3 10*3/uL (ref 4.0–10.5)

## 2014-01-06 LAB — TSH: TSH: 1.08 u[IU]/mL (ref 0.35–4.50)

## 2014-01-06 MED ORDER — ONDANSETRON 4 MG PO TBDP
ORAL_TABLET | ORAL | Status: AC
  Administered 2014-01-06: 4 mg via ORAL
  Filled 2014-01-06: qty 1

## 2014-01-06 MED ORDER — ONDANSETRON 4 MG PO TBDP
4.0000 mg | ORAL_TABLET | Freq: Once | ORAL | Status: AC
  Administered 2014-01-06: 4 mg via ORAL
  Filled 2014-01-06: qty 1

## 2014-01-06 MED ORDER — HYDROCODONE-ACETAMINOPHEN 5-325 MG PO TABS
2.0000 | ORAL_TABLET | Freq: Once | ORAL | Status: AC
  Administered 2014-01-06: 2 via ORAL
  Filled 2014-01-06: qty 2

## 2014-01-06 NOTE — ED Notes (Signed)
Pt fell forward down 3 steps, injuring both knees and ankles.

## 2014-01-06 NOTE — ED Notes (Signed)
Pt walking down steps and missed 3 steps  Fell  C/o pain to bilateral knees, shins and ankles,  Ankles swollen  Pos pedal pulse  Ice applied to ankles

## 2014-01-06 NOTE — ED Provider Notes (Signed)
CSN: [id number]     Arrival date & time 01/06/14  2147 History  This chart was scribed for Ephraim Hamburger, MD by Evelene Croon, ED Scribe. This patient was seen in room MH09/MH09 and the patient's care was started 10:34 PM.    Chief Complaint  Patient presents with  . Fall     The history is provided by the patient. No language interpreter was used.     HPI Comments:  Joan Robinson is a 53 y.o. female who presents to the Emergency Department s/p fall this evening  complaining of moderate constant BLE pain following the incident. She reports pain from her knees down to her feet.  She states she was on outside steps which she notes were slick when she  missed 2-3 steps going down and landed on her knees which were somewhat twisted. She denies dizziness prior to fall. She also denies head injury and LOC.She reports shooting pain in her right foot when she bears weight on the extremity. No alleviating factors or associated symptoms noted.    Past Medical History  Diagnosis Date  . Allergic rhinitis   . Adenomatous colon polyp   . Diverticulosis   . Colitis, Clostridium difficile 5/11,6/11  . Hypothyroidism   . Urinary incontinence   . PUD (peptic ulcer disease)   . Tobacco abuse   . Hypertension   . Hyperlipidemia   . Colon polyp     benign  . Anemia 10/10/2011  . Preventative health care 10/10/2011  . Sinusitis acute 10/10/2011  . Chicken pox as a child    X 2  . Mumps as a child  . Lower back pain   . 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 Staph aureus infection 12/18/2011    Recurrent lesions in nares  . Perimenopausal 01/16/2012  . Anxiety and depression 01/17/2007    Qualifier: Diagnosis of  By: Loanne Drilling MD, Jacelyn Pi   . 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 Right knee pain 05/10/2012  . BCC (basal cell carcinoma of skin) 06/01/2012    Leg Follows with Dr Nevada Crane  . Headache(784.0) 08/17/2012  . Bipolar disorder 01/17/2007    Qualifier: Diagnosis of  By: Loanne Drilling MD, Jacelyn Pi   .  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 Arthritis 07/24/2013    Likely inflammatory and following with Dr Lynnda Shields of [facility]  rheumatology  . Diverticulosis   . Hypothyroidism 08/24/2006    Qualifier: Diagnosis of  By: Loanne Drilling MD, Sean A    . 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 Obesity 11/01/2013   Past Surgical History  Procedure Laterality Date  . Tubal ligation  1995  . Uterine suspension      mesh  . Colonoscopy    . Gated spect wall motion stress cardiolite  11/05/2001  . Polypectomy    . Abdominal hysterectomy    . Abdominal hysterectomy  2011    complete  . Dilation and curettage of uterus  1985   Family History  Problem Relation Age of Onset  . Heart disease Mother     stents  . Stroke Mother   . Hyperlipidemia Mother   . Hypertension Mother   . Other Mother     blood disorder- mgus  . Colon polyps Father   . Other Father     aorta disection  . Aneurysm Father   . Colon cancer Paternal Uncle   . Irritable bowel syndrome Daughter   . Other Daughter     gastritis  . Mental  illness Daughter     bipolar and mood disorder  . Hypertension Sister     ?  Marland Kitchen Mental illness Son     bipolar  . Arthritis Sister   . Other Sister     thyroid  . Other Sister     thyroid   History  Substance Use Topics  . Smoking status: Former Smoker -- 1.00 packs/day for 30 years    Types: Cigarettes    Quit date: 05/15/2009  . Smokeless tobacco: Never Used  . Alcohol Use: No   OB History    No data available     Review of Systems  Gastrointestinal: Negative for vomiting.  Musculoskeletal: Positive for myalgias, joint swelling and arthralgias.       BLE  Neurological: Negative for weakness and numbness.  All other systems reviewed and are negative.     Allergies  Keflex; Aspirin; Dicyclomine hcl; Metronidazole; Moxifloxacin; Nsaids; Phenergan; Quetiapine; and Sulfamethoxazole-trimethoprim  Home Medications   Prior to Admission medications   Medication Sig Start Date  End Date Taking? Authorizing Provider  albuterol (PROVENTIL HFA;VENTOLIN HFA) 108 (90 BASE) MCG/ACT inhaler Inhale 2 puffs into the lungs every 6 (six) hours as needed for wheezing or shortness of breath. Patient not taking: Reported on 01/05/2014 12/19/12   Mosie Lukes, MD  ALPRAZolam Duanne Moron) 0.25 MG tablet Take 1 tablet (0.25 mg total) by mouth 3 (three) times daily as needed for sleep or anxiety. 01/05/14   Mosie Lukes, MD  amphetamine-dextroamphetamine (ADDERALL) 20 MG tablet Take 1 tablet (20 mg total) by mouth 2 (two) times daily. January 2016 rx 01/05/14   Mosie Lukes, MD  calcium carbonate (TUMS E-X 750) 750 MG chewable tablet Chew 2 tablets by mouth daily.      Historical Provider, MD  DULoxetine (CYMBALTA) 20 MG capsule Take 1 capsule (20 mg total) by mouth daily. 01/05/14   Mosie Lukes, MD  HYDROcodone-acetaminophen (NORCO) 10-325 MG per tablet Take 1 tablet by mouth every 6 (six) hours as needed. 01/05/14   Mosie Lukes, MD  hydrocortisone (ANUSOL-HC) 25 MG suppository Place 1 suppository (25 mg total) rectally at bedtime as needed for hemorrhoids or itching. 01/05/14   Mosie Lukes, MD  levothyroxine (SYNTHROID, LEVOTHROID) 100 MCG tablet Take 1 tablet (100 mcg total) by mouth daily before breakfast. 10/29/13   Mosie Lukes, MD  Loperamide HCl (IMODIUM A-D PO) Take by mouth.    Historical Provider, MD  loratadine (CLARITIN) 10 MG tablet Take 1 tablet (10 mg total) by mouth 2 (two) times daily as needed for allergies or itching (dermatitis). Patient not taking: Reported on 01/05/2014 07/24/13   Mosie Lukes, MD  metoprolol tartrate (LOPRESSOR) 25 MG tablet Take 0.5 tablets (12.5 mg total) by mouth 2 (two) times daily. 01/05/14   Mosie Lukes, MD  nitroGLYCERIN (NITROSTAT) 0.4 MG SL tablet Place 0.4 mg under the tongue every 5 (five) minutes as needed.      Historical Provider, MD  ondansetron (ZOFRAN) 4 MG tablet Take 1 tablet (4 mg total) by mouth every 6 (six) hours  as needed. Patient not taking: Reported on 01/05/2014 11/21/12   Mosie Lukes, MD  Phentermine-Topiramate [facility]) 3.75-23 MG CP24 Take 1 capsule by mouth daily. Patient not taking: Reported on 01/05/2014 10/27/13   Mosie Lukes, MD  Phentermine-Topiramate [facility]) 7.5-46 MG CP24 Take 1 capsule by mouth daily. Patient not taking: Reported on 01/05/2014 10/27/13   Mosie Lukes, MD  Probiotic  Product (PROBIOTIC FORMULA) CAPS Take 1 capsule by mouth daily.      Historical Provider, MD  ranitidine (ZANTAC) 150 MG tablet Take 1 tablet (150 mg total) by mouth 2 (two) times daily. 12/18/13   Mosie Lukes, MD  sucralfate (CARAFATE) 1 G tablet Take 1 tablet (1 g total) by mouth 4 (four) times daily -  with meals and at bedtime. 01/05/14   Mosie Lukes, MD  SUMAtriptan (IMITREX) 50 MG tablet Take 1 tablet (50 mg total) by mouth every 2 (two) hours as needed for migraine or headache. May repeat in 2 hours if headache persists or recurs. Patient not taking: Reported on 01/05/2014 10/27/13   Mosie Lukes, MD   BP 133/84 mmHg  Pulse 80  Temp(Src) 98.4 F (36.9 C) (Oral)  Resp 18  Ht 5\' 6"  (1.676 m)  Wt 209 lb (94.802 kg)  BMI 33.75 kg/m2  SpO2 99% Physical Exam  Constitutional: She is oriented to person, place, and time. She appears well-developed and well-nourished. No distress.  HENT:  Head: Normocephalic and atraumatic.  Neck: Neck supple.  Cardiovascular: Normal rate.   Pulses:      Dorsalis pedis pulses are 2+ on the right side, and 2+ on the left side.  Pulmonary/Chest: Effort normal.  Abdominal: She exhibits no distension.  Musculoskeletal: She exhibits edema and tenderness.  Lateral malleolus tenderness and swelling to right ankle Mild tenderness to right tibia; no swelling or ecchymosis Mild lateral left malleoulous swelling Mild tenderness to left ankle and tibia  Neurological: She is alert and oriented to person, place, and time.  Skin: Skin is warm and dry.  Psychiatric:  She has a normal mood and affect.  Nursing note and vitals reviewed.   ED Course  Procedures   DIAGNOSTIC STUDIES:  Oxygen Saturation is 99% on RA, normal by my interpretation.    COORDINATION OF CARE:  10:42 PM Discussed treatment plan with pt at bedside and pt agreed to plan.  Labs Review Labs Reviewed - No data to display  Imaging Review Dg Tibia/fibula Left  01/06/2014   CLINICAL DATA:  Status post fall down stairs tonight, injuring both ankles. Pain at the lateral malleolus bilaterally, with swelling. Initial encounter.  EXAM: LEFT TIBIA AND FIBULA - 2 VIEW  COMPARISON:  None.  FINDINGS: There is no evidence of fracture or dislocation. The tibia and fibula appear intact. The ankle mortise is incompletely assessed, but appears grossly unremarkable. The knee joint is grossly unremarkable in appearance, though incompletely assessed. No significant soft tissue abnormalities are characterized on radiograph.  IMPRESSION: No evidence of fracture or dislocation.   Electronically Signed   By: Garald Balding M.D.   On: 01/06/2014 23:54   Dg Tibia/fibula Right  01/06/2014   CLINICAL DATA:  Status post fall down stairs tonight, injuring both ankles. Pain at the lateral malleolus bilaterally, with swelling. Initial encounter.  EXAM: RIGHT TIBIA AND FIBULA - 2 VIEW  COMPARISON:  None.  FINDINGS: Minimal osseous fragments distal to the lateral malleolus may reflect avulsion injury. There is no additional evidence of fracture. The proximal tibia and fibula appear intact. The knee joint is unremarkable in appearance. No definite knee joint effusion is identified, though evaluation is limited given knee positioning. Mild soft tissue swelling is noted overlying the lateral malleolus.  IMPRESSION: Minimal osseous fragments distal to the lateral malleolus may reflect avulsion injury. No additional evidence of fracture.   Electronically Signed   By: Francoise Schaumann.D.  On: 01/06/2014 23:54   Dg Ankle  Complete Left  01/06/2014   CLINICAL DATA:  Status post fall down stairs tonight, injuring both ankles. Pain at the lateral malleolus bilaterally, with swelling. Initial encounter.  EXAM: LEFT ANKLE COMPLETE - 3+ VIEW  COMPARISON:  Left foot MRI performed 07/05/2011  FINDINGS: There is no evidence of fracture or dislocation. The ankle mortise is intact; the interosseous space is within normal limits. No talar tilt or subluxation is seen. A tiny plantar calcaneal spur is seen.  The joint spaces are preserved. Mild lateral soft tissue swelling is noted. There is mild edema at Kager's fat pad.  IMPRESSION: No evidence of fracture or dislocation.   Electronically Signed   By: Garald Balding M.D.   On: 01/06/2014 23:51   Dg Ankle Complete Right  01/06/2014   CLINICAL DATA:  Status post fall down stairs tonight, injuring both ankles. Pain at the lateral malleolus bilaterally, with swelling. Initial encounter.  EXAM: RIGHT ANKLE - COMPLETE 3+ VIEW  COMPARISON:  None.  FINDINGS: Minimal osseous fragments distal to the lateral malleolus may reflect avulsion injury. There is no additional evidence of fracture.  The ankle mortise is intact; the interosseous space is within normal limits. No talar tilt or subluxation is seen. A small plantar calcaneal spur is incidentally noted.  The joint spaces are preserved. Mild lateral soft tissue swelling is noted.  IMPRESSION: Minimal osseous fragments distal to the lateral malleolus may reflect avulsion injury. No additional fractures seen.   Electronically Signed   By: Garald Balding M.D.   On: 01/06/2014 23:52     EKG Interpretation None      MDM   Final diagnoses:  Right ankle sprain, initial encounter  Left ankle sprain, initial encounter  Fall, initial encounter    Patient with mechanical fall down steps and bilateral leg injuries. Has likely bilateral ankle sprains, possibly more severe on right with possible avulsion injury. Due to this, will place right in  cam walker. Will treat pain and refer back to her orthopedic surgeon.   I personally performed the services described in this documentation, which was scribed in my presence. The recorded information has been reviewed and is accurate.     Ephraim Hamburger, MD 01/07/14 [phone]

## 2014-01-07 MED ORDER — HYDROCODONE-ACETAMINOPHEN 5-325 MG PO TABS: 1.0000 | ORAL_TABLET | ORAL | Status: DC | PRN

## 2014-01-07 NOTE — Discharge Instructions (Signed)
Ankle Sprain °An ankle sprain is an injury to the strong, fibrous tissues (ligaments) that hold the bones of your ankle joint together.  °CAUSES °An ankle sprain is usually caused by a fall or by twisting your ankle. Ankle sprains most commonly occur when you step on the outer edge of your foot, and your ankle turns inward. People who participate in sports are more prone to these types of injuries.  °SYMPTOMS  °· Pain in your ankle. The pain may be present at rest or only when you are trying to stand or walk. °· Swelling. °· Bruising. Bruising may develop immediately or within 1 to 2 days after your injury. °· Difficulty standing or walking, particularly when turning corners or changing directions. °DIAGNOSIS  °Your caregiver will ask you details about your injury and perform a physical exam of your ankle to determine if you have an ankle sprain. During the physical exam, your caregiver will press on and apply pressure to specific areas of your foot and ankle. Your caregiver will try to move your ankle in certain ways. An X-ray exam may be done to be sure a bone was not broken or a ligament did not separate from one of the bones in your ankle (avulsion fracture).  °TREATMENT  °Certain types of braces can help stabilize your ankle. Your caregiver can make a recommendation for this. Your caregiver may recommend the use of medicine for pain. If your sprain is severe, your caregiver may refer you to a surgeon who helps to restore function to parts of your skeletal system (orthopedist) or a physical therapist. °HOME CARE INSTRUCTIONS  °· Apply ice to your injury for 1-2 days or as directed by your caregiver. Applying ice helps to reduce inflammation and pain. °· Put ice in a plastic bag. °· Place a towel between your skin and the bag. °· Leave the ice on for 15-20 minutes at a time, every 2 hours while you are awake. °· Only take over-the-counter or prescription medicines for pain, discomfort, or fever as directed by  your caregiver. °· Elevate your injured ankle above the level of your heart as much as possible for 2-3 days. °· If your caregiver recommends crutches, use them as instructed. Gradually put weight on the affected ankle. Continue to use crutches or a cane until you can walk without feeling pain in your ankle. °· If you have a plaster splint, wear the splint as directed by your caregiver. Do not rest it on anything harder than a pillow for the first 24 hours. Do not put weight on it. Do not get it wet. You may take it off to take a shower or bath. °· You may have been given an elastic bandage to wear around your ankle to provide support. If the elastic bandage is too tight (you have numbness or tingling in your foot or your foot becomes cold and blue), adjust the bandage to make it comfortable. °· If you have an air splint, you may blow more air into it or let air out to make it more comfortable. You may take your splint off at night and before taking a shower or bath. Wiggle your toes in the splint several times per day to decrease swelling. °SEEK MEDICAL CARE IF:  °· You have rapidly increasing bruising or swelling. °· Your toes feel extremely cold or you lose feeling in your foot. °· Your pain is not relieved with medicine. °SEEK IMMEDIATE MEDICAL CARE IF: °· Your toes are numb or blue. °·   You have severe pain that is increasing. MAKE SURE YOU:   Understand these instructions.  Will watch your condition.  Will get help right away if you are not doing well or get worse. Document Released: 01/01/2005 Document Revised: 09/26/2011 Document Reviewed: 01/13/2011 [facility] Patient Information 2015 [place], [place]. This information is not intended to replace advice given to you by your health care provider. Make sure you discuss any questions you have with your health care provider.   Ankle Fracture A fracture is a break in a bone. The ankle joint is made up of three bones. These include the lower  (distal)sections of your lower leg bones, called the tibia and fibula, along with a bone in your foot, called the talus. Depending on how bad the break is and if more than one ankle joint bone is broken, a cast or splint is used to protect and keep your injured bone from moving while it heals. Sometimes, surgery is required to help the fracture heal properly.  There are two general types of fractures:  Stable fracture. This includes a single fracture line through one bone, with no injury to ankle ligaments. A fracture of the talus that does not have any displacement (movement of the bone on either side of the fracture line) is also stable.  Unstable fracture. This includes more than one fracture line through one or more bones in the ankle joint. It also includes fractures that have displacement of the bone on either side of the fracture line. CAUSES  A direct blow to the ankle.   Quickly and severely twisting your ankle.  Trauma, such as a car accident or falling from a significant height. RISK FACTORS You may be at a higher risk of ankle fracture if:  You have certain medical conditions.  You are involved in high-impact sports.  You are involved in a high-impact car accident. SIGNS AND SYMPTOMS   Tender and swollen ankle.  Bruising around the injured ankle.  Pain on movement of the ankle.  Difficulty walking or putting weight on the ankle.  A cold foot below the site of the ankle injury. This can occur if the blood vessels passing through your injured ankle were also damaged.  Numbness in the foot below the site of the ankle injury. DIAGNOSIS  An ankle fracture is usually diagnosed with a physical exam and X-rays. A CT scan may also be required for complex fractures. TREATMENT  Stable fractures are treated with a cast or splint and using crutches to avoid putting weight on your injured ankle. This is followed by an ankle strengthening program. Some patients require a special  type of cast, depending on other medical problems they may have. Unstable fractures require surgery to ensure the bones heal properly. Your health care provider will tell you what type of fracture you have and the best treatment for your condition. HOME CARE INSTRUCTIONS   Review correct crutch use with your health care provider and use your crutches as directed. Safe use of crutches is extremely important. Misuse of crutches can cause you to fall or cause injury to nerves in your hands or armpits.  Do not put weight or pressure on the injured ankle until directed by your health care provider.  To lessen the swelling, keep the injured leg elevated while sitting or lying down.  Apply ice to the injured area:  Put ice in a plastic bag.  Place a towel between your cast and the bag.  Leave the ice on for  20 minutes, 2-3 times a day.  If you have a plaster or fiberglass cast:  Do not try to scratch the skin under the cast with any objects. This can increase your risk of skin infection.  Check the skin around the cast every day. You may put lotion on any red or sore areas.  Keep your cast dry and clean.  If you have a plaster splint:  Wear the splint as directed.  You may loosen the elastic around the splint if your toes become numb, tingle, or turn cold or blue.  Do not put pressure on any part of your cast or splint; it may break. Rest your cast only on a pillow the first 24 hours until it is fully hardened.  Your cast or splint can be protected during bathing with a plastic bag sealed to your skin with medical tape. Do not lower the cast or splint into water.  Take medicines as directed by your health care provider. Only take over-the-counter or prescription medicines for pain, discomfort, or fever as directed by your health care provider.  Do not drive a vehicle until your health care provider specifically tells you it is safe to do so.  If your health care provider has given you  a follow-up appointment, it is very important to keep that appointment. Not keeping the appointment could result in a chronic or permanent injury, pain, and disability. If you have any problem keeping the appointment, call the facility for assistance. SEEK MEDICAL CARE IF: You develop increased swelling or discomfort. SEEK IMMEDIATE MEDICAL CARE IF:   Your cast gets damaged or breaks.  You have continued severe pain.  You develop new pain or swelling after the cast was put on.  Your skin or toenails below the injury turn blue or gray.  Your skin or toenails below the injury feel cold, numb, or have loss of sensitivity to touch.  There is a bad smell or pus draining from under the cast. MAKE SURE YOU:   Understand these instructions.  Will watch your condition.  Will get help right away if you are not doing well or get worse. Document Released: 12/30/1999 Document Revised: 01/06/2013 Document Reviewed: 07/31/2012 [facility] Patient Information 2015 [place], [place]. This information is not intended to replace advice given to you by your health care provider. Make sure you discuss any questions you have with your health care provider.

## 2014-01-10 ENCOUNTER — Encounter: Payer: Self-pay | Admitting: Family Medicine

## 2014-01-10 NOTE — Assessment & Plan Note (Signed)
Doing better on current meds. No changes

## 2014-01-10 NOTE — Progress Notes (Signed)
Joan Robinson  [id number] 10-06-1960 01/10/2014      Progress Note-Follow Up  Subjective  Chief Complaint  No chief complaint on file.   HPI  Patient is a 53 y.o. female in today for routine medical care. Patient is in today for follow-up. Doing fairly well. Feels much calmer. Still struggles somewhat with mood but it is improved. No recent illness. Does continue to struggle with low back pain and some radicular symptoms at times. No incontinence. Denies CP/palp/SOB/HA/congestion/fevers/GI or GU c/o. Taking meds as prescribed  Past Medical History  Diagnosis Date  . Allergic rhinitis   . Adenomatous colon polyp   . Diverticulosis   . Colitis, Clostridium difficile 5/11,6/11  . Hypothyroidism   . Urinary incontinence   . PUD (peptic ulcer disease)   . Tobacco abuse   . Hypertension   . Hyperlipidemia   . Colon polyp     benign  . Anemia 10/10/2011  . Preventative health care 10/10/2011  . Sinusitis acute 10/10/2011  . Chicken pox as a child    X 2  . Mumps as a child  . Lower back pain   . 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 Staph aureus infection 12/18/2011    Recurrent lesions in nares  . Perimenopausal 01/16/2012  . Anxiety and depression 01/17/2007    Qualifier: Diagnosis of  By: Everardo All MD, Cleophas Dunker   . 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 Right knee pain 05/10/2012  . BCC (basal cell carcinoma of skin) 06/01/2012    Leg Follows with Dr Margo Aye  . Headache(784.0) 08/17/2012  . Bipolar disorder 01/17/2007    Qualifier: Diagnosis of  By: Everardo All MD, Cleophas Dunker   . 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 Arthritis 07/24/2013    Likely inflammatory and following with Dr Maryln Gottron of [facility]  rheumatology  . Diverticulosis   . Hypothyroidism 08/24/2006    Qualifier: Diagnosis of  By: Everardo All MD, Sean A    . 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 Obesity 11/01/2013    Past Surgical History  Procedure Laterality Date  . Tubal ligation  1995  . Uterine suspension      mesh  . Colonoscopy     . Gated spect wall motion stress cardiolite  11/05/2001  . Polypectomy    . Abdominal hysterectomy    . Abdominal hysterectomy  2011    complete  . Dilation and curettage of uterus  1985    Family History  Problem Relation Age of Onset  . Heart disease Mother     stents  . Stroke Mother   . Hyperlipidemia Mother   . Hypertension Mother   . Other Mother     blood disorder- mgus  . Colon polyps Father   . Other Father     aorta disection  . Aneurysm Father   . Colon cancer Paternal Uncle   . Irritable bowel syndrome Daughter   . Other Daughter     gastritis  . Mental illness Daughter     bipolar and mood disorder  . Hypertension Sister     ?  Marland Kitchen Mental illness Son     bipolar  . Arthritis Sister   . Other Sister     thyroid  . Other Sister     thyroid    History   Social History  . Marital Status: Married    Spouse Name: N/A    Number of Children: 2  . Years of Education: N/A   Occupational History  .  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 Smoking status: Former Smoker -- 1.00 packs/day for 30 years    Types: Cigarettes    Quit date: 05/15/2009  . Smokeless tobacco: Never Used  . Alcohol Use: No  . Drug Use: No  . Sexual Activity: No   Other Topics Concern  . Not on file   Social History Narrative    Current Outpatient Prescriptions on File Prior to Visit  Medication Sig Dispense Refill  . calcium carbonate (TUMS E-X 750) 750 MG chewable tablet Chew 2 tablets by mouth daily.      Marland Kitchen levothyroxine (SYNTHROID, LEVOTHROID) 100 MCG tablet Take 1 tablet (100 mcg total) by mouth daily before breakfast. 30 tablet 3  . Loperamide HCl (IMODIUM A-D PO) Take by mouth.    . nitroGLYCERIN (NITROSTAT) 0.4 MG SL tablet Place 0.4 mg under the tongue every 5 (five) minutes as needed.      . Probiotic Product (PROBIOTIC FORMULA) CAPS Take 1 capsule by mouth daily.      . ranitidine (ZANTAC) 150 MG tablet Take 1 tablet (150 mg total) by mouth 2 (two)  times daily. 180 tablet 1  . albuterol (PROVENTIL HFA;VENTOLIN HFA) 108 (90 BASE) MCG/ACT inhaler Inhale 2 puffs into the lungs every 6 (six) hours as needed for wheezing or shortness of breath. (Patient not taking: Reported on 01/05/2014) 1 Inhaler 0  . loratadine (CLARITIN) 10 MG tablet Take 1 tablet (10 mg total) by mouth 2 (two) times daily as needed for allergies or itching (dermatitis). (Patient not taking: Reported on 01/05/2014) 30 tablet 5  . ondansetron (ZOFRAN) 4 MG tablet Take 1 tablet (4 mg total) by mouth every 6 (six) hours as needed. (Patient not taking: Reported on 01/05/2014) 20 tablet 5  . Phentermine-Topiramate (QSYMIA) 7.5-46 MG CP24 Take 1 capsule by mouth daily. (Patient not taking: Reported on 01/05/2014) 30 capsule 1  . SUMAtriptan (IMITREX) 50 MG tablet Take 1 tablet (50 mg total) by mouth every 2 (two) hours as needed for migraine or headache. May repeat in 2 hours if headache persists or recurs. (Patient not taking: Reported on 01/05/2014) 10 tablet 0   No current facility-administered medications on file prior to visit.    Allergies  Allergen Reactions  . Keflex [Cephalexin] Diarrhea  . Aspirin     REACTION: difficulty breathing  . Dicyclomine Hcl     REACTION: mouth ulcers  . Metronidazole     REACTION: hives, mouth ulcers  . Moxifloxacin     REACTION: increased heart rate, nausea  . Nsaids     REACTION: difficulty breathing  . Phenergan [Promethazine Hcl]     "knocks" pt out for about 3 days  . Quetiapine     Somnolence. Slept for 36 hours straight.  . Sulfamethoxazole-Trimethoprim     REACTION: increased heart rate, nausea    Review of Systems  Review of Systems  Constitutional: Negative for fever and malaise/fatigue.  HENT: Negative for congestion.   Eyes: Negative for discharge.  Respiratory: Negative for shortness of breath.   Cardiovascular: Negative for chest pain, palpitations and leg swelling.  Gastrointestinal: Negative for nausea,  abdominal pain and diarrhea.  Genitourinary: Negative for dysuria.  Musculoskeletal: Negative for falls.  Skin: Negative for rash.  Neurological: Negative for loss of consciousness and headaches.  Endo/Heme/Allergies: Negative for polydipsia.  Psychiatric/Behavioral: Negative for depression and suicidal ideas. The patient is not nervous/anxious and does not have insomnia.     Objective  BP  143/78 mmHg  Pulse 77  Temp(Src) 98.7 F (37.1 C) (Oral)  Resp 14  Ht 5\' 6"  (1.676 m)  Wt 209 lb 6.4 oz (94.983 kg)  BMI 33.81 kg/m2  SpO2 98%  Physical Exam  Physical Exam  Constitutional: She is oriented to person, place, and time and well-developed, well-nourished, and in no distress. No distress.  HENT:  Head: Normocephalic and atraumatic.  Eyes: Conjunctivae are normal.  Neck: Neck supple. No thyromegaly present.  Cardiovascular: Normal rate, regular rhythm and normal heart sounds.   No murmur heard. Pulmonary/Chest: Effort normal and breath sounds normal. She has no wheezes.  Abdominal: She exhibits no distension and no mass.  Musculoskeletal: She exhibits no edema.  Lymphadenopathy:    She has no cervical adenopathy.  Neurological: She is alert and oriented to person, place, and time.  Skin: Skin is warm and dry. No rash noted. She is not diaphoretic.  Psychiatric: Memory, affect and judgment normal.    Lab Results  Component Value Date   TSH 1.08 01/05/2014   Lab Results  Component Value Date   WBC 6.3 01/05/2014   HGB 12.6 01/05/2014   HCT 38.0 01/05/2014   MCV 89.1 01/05/2014   PLT 256.0 01/05/2014   Lab Results  Component Value Date   CREATININE 1.0 10/27/2013   BUN 6 10/27/2013   NA 141 10/27/2013   K 4.1 10/27/2013   CL 105 10/27/2013   CO2 31 10/27/2013   Lab Results  Component Value Date   ALT 22 10/27/2013   AST 23 10/27/2013   ALKPHOS 92 10/27/2013   BILITOT 0.5 10/27/2013   Lab Results  Component Value Date   CHOL 212* 10/27/2013   Lab  Results  Component Value Date   HDL 32.20* 10/27/2013   No results found for: [facility] Lab Results  Component Value Date   TRIG 303.0* 10/27/2013   Lab Results  Component Value Date   CHOLHDL 7 10/27/2013     Assessment & Plan  Essential hypertension Denies CP/palp/SOB/HA/congestion/fevers/GI or GU c/o. Taking meds as prescribed  GERD Avoid offending foods, start probiotics. Do not eat large meals in late evening and consider raising head of bed.   Hypothyroidism On Levothyroxine, continue to monitor  Hyperlipidemia, mixed Encouraged heart healthy diet, increase exercise, avoid trans fats, consider a krill oil cap daily  Bipolar disorder Doing better on current meds. No changes  Obesity Encouraged DASH diet, decrease po intake and increase exercise as tolerated. Needs 7-8 hours of sleep nightly. Avoid trans fats, eat small, frequent meals every 4-5 hours with lean proteins, complex carbs and healthy fats. Minimize simple carbs, GMO foods.

## 2014-01-10 NOTE — Assessment & Plan Note (Signed)
Encouraged heart healthy diet, increase exercise, avoid trans fats, consider a krill oil cap daily 

## 2014-01-10 NOTE — Assessment & Plan Note (Signed)
Avoid offending foods, start probiotics. Do not eat large meals in late evening and consider raising head of bed.  

## 2014-01-10 NOTE — Assessment & Plan Note (Signed)
On Levothyroxine, continue to monitor 

## 2014-01-10 NOTE — Assessment & Plan Note (Signed)
Denies CP/palp/SOB/HA/congestion/fevers/GI or GU c/o. Taking meds as prescribed 

## 2014-01-10 NOTE — Assessment & Plan Note (Signed)
Encouraged DASH diet, decrease po intake and increase exercise as tolerated. Needs 7-8 hours of sleep nightly. Avoid trans fats, eat small, frequent meals every 4-5 hours with lean proteins, complex carbs and healthy fats. Minimize simple carbs, GMO foods. 

## 2014-01-18 ENCOUNTER — Encounter: Payer: Self-pay | Admitting: Family Medicine

## 2014-01-18 ENCOUNTER — Other Ambulatory Visit: Payer: Self-pay | Admitting: Family Medicine

## 2014-01-18 DIAGNOSIS — J209 Acute bronchitis, unspecified: Secondary | ICD-10-CM

## 2014-01-18 MED ORDER — METHYLPREDNISOLONE (PAK) 4 MG PO TABS: ORAL_TABLET | ORAL | Status: DC

## 2014-02-02 ENCOUNTER — Telehealth: Payer: Self-pay | Admitting: *Deleted

## 2014-02-02 NOTE — Telephone Encounter (Signed)
Prior authorization for Qsymia initiated. Awaiting determination from [facility]. JG//CMA

## 2014-02-17 ENCOUNTER — Other Ambulatory Visit: Payer: Self-pay | Admitting: Physician Assistant

## 2014-02-17 ENCOUNTER — Telehealth: Payer: Self-pay | Admitting: Family Medicine

## 2014-02-17 ENCOUNTER — Telehealth: Payer: Self-pay | Admitting: Physician Assistant

## 2014-02-17 DIAGNOSIS — G43809 Other migraine, not intractable, without status migrainosus: Secondary | ICD-10-CM

## 2014-02-17 DIAGNOSIS — E039 Hypothyroidism, unspecified: Secondary | ICD-10-CM

## 2014-02-17 MED ORDER — LEVOTHYROXINE SODIUM 100 MCG PO TABS: 100.0000 ug | ORAL_TABLET | Freq: Every day | ORAL | Status: DC

## 2014-02-17 MED ORDER — SUMATRIPTAN SUCCINATE 50 MG PO TABS: 50.0000 mg | ORAL_TABLET | Freq: Once | ORAL | Status: DC

## 2014-02-17 NOTE — Telephone Encounter (Signed)
Synthroid and imitrex refilled in PCP's absence.

## 2014-02-17 NOTE — Telephone Encounter (Signed)
Caller name:Benecke Lakeithia Relation to IN:OMVE Call back number:[phone] Pharmacy:  Reason for call: pt states she had an appt today with her allergist doctor, and the doctor seems to think that the rx cymbalta is causing the rash on her body, suggesting that the pt comes off the rx, and what to know from dr. Charlett Blake how she should come off the meds.

## 2014-02-18 NOTE — Telephone Encounter (Signed)
Please advise.//AB/CMA 

## 2014-02-18 NOTE — Telephone Encounter (Signed)
She is on the lowest dose she she can change to taking it every other day for a couple weeks then stop

## 2014-02-19 NOTE — Telephone Encounter (Signed)
Called and spoke with the pt and informed her of Dr. Frederik Pear recommendation below.  Pt verbalized understanding and agreed.  Pt asked if she should follow-up with Dr. Charlett Blake to maybe be put on something else.  Informed the pt that would be a good idea.  Pt agreed.//AB/CMA

## 2014-03-03 HISTORY — PX: VITRECTOMY: SHX106

## 2014-03-03 HISTORY — PX: EYE SURGERY: SHX253

## 2014-03-16 ENCOUNTER — Encounter: Payer: Self-pay | Admitting: Primary Care

## 2014-03-16 ENCOUNTER — Ambulatory Visit: Payer: Managed Care, Other (non HMO) | Admitting: Physician Assistant

## 2014-03-16 ENCOUNTER — Ambulatory Visit (INDEPENDENT_AMBULATORY_CARE_PROVIDER_SITE_OTHER): Payer: Managed Care, Other (non HMO) | Admitting: Primary Care

## 2014-03-16 VITALS — BP 109/73 | HR 80 | Temp 98.0°F | Resp 16 | Ht 66.0 in | Wt 206.6 lb

## 2014-03-16 DIAGNOSIS — M5441 Lumbago with sciatica, right side: Secondary | ICD-10-CM | POA: Diagnosis not present

## 2014-03-16 DIAGNOSIS — F909 Attention-deficit hyperactivity disorder, unspecified type: Secondary | ICD-10-CM | POA: Diagnosis not present

## 2014-03-16 DIAGNOSIS — F988 Other specified behavioral and emotional disorders with onset usually occurring in childhood and adolescence: Secondary | ICD-10-CM

## 2014-03-16 MED ORDER — AMPHETAMINE-DEXTROAMPHETAMINE 20 MG PO TABS: 20.0000 mg | ORAL_TABLET | Freq: Two times a day (BID) | ORAL | Status: DC

## 2014-03-16 MED ORDER — HYDROCODONE-ACETAMINOPHEN 10-325 MG PO TABS: 1.0000 | ORAL_TABLET | Freq: Four times a day (QID) | ORAL | Status: DC | PRN

## 2014-03-16 NOTE — Assessment & Plan Note (Addendum)
Stable. No complaints. Refilled Adderall per patient's request. Encouraged her to make an appointment with Dr. Randel Pigg for further refills and evaluation.

## 2014-03-16 NOTE — Patient Instructions (Signed)
Please stop by the lab for your urine drug screen which is required for your refills. Follow up with Dr. Randel Pigg as scheduled. Have a great day!

## 2014-03-16 NOTE — Progress Notes (Signed)
Subjective:    Patient ID: Joan Robinson, female    DOB: 1960/03/08, 54 y.o.   MRN: [id number]  HPI  Joan Robinson is a 54 year old female who presents today requesting refills of adderall and hydrocodone.   1) ADD- patient takes Adderall for her ADD which helps with alertness, attention, and keeps her focused. Patient is using this medication five to seven days twice daily, weekly and will be out this week. She has no complaints of palpitations, chest pain, headaches.  2) Chronic Back Pain - patient has a history of herniated disc, degenerative disc disease, and arthritis. She takes this medication sporatically depending on her pain but on average three times weekly. She is out of this medication and is requesting this for management of back pain. She is working to lose weight to get the pressure off her back and hopes to be off of the hydrocodone eventually. She is planning on scheduling an appointment with rheumatology in the near future for arthritis.     Review of Systems  Constitutional: Negative for fever and chills.  Respiratory: Negative for shortness of breath.   Cardiovascular: Negative for chest pain and palpitations.  Gastrointestinal: Positive for nausea. Negative for vomiting.       Chronic with GI symptoms. Follows with GI.  Musculoskeletal: Positive for myalgias, back pain, joint swelling and arthralgias.  Neurological: Negative for dizziness and headaches.  Psychiatric/Behavioral: Negative for decreased concentration and agitation. The patient is not nervous/anxious.        Past Medical History  Diagnosis Date  . Allergic rhinitis   . Adenomatous colon polyp   . Diverticulosis   . Colitis, Clostridium difficile 5/11,6/11  . Hypothyroidism   . Urinary incontinence   . PUD (peptic ulcer disease)   . Tobacco abuse   . Hypertension   . Hyperlipidemia   . Colon polyp     benign  . Anemia 10/10/2011  . Preventative health care 10/10/2011  . Sinusitis acute 10/10/2011   . Chicken pox as a child    X 2  . Mumps as a child  . Lower back pain   . 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 Staph aureus infection 12/18/2011    Recurrent lesions in nares  . Perimenopausal 01/16/2012  . Anxiety and depression 01/17/2007    Qualifier: Diagnosis of  By: Everardo All MD, Cleophas Dunker   . 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 Right knee pain 05/10/2012  . BCC (basal cell carcinoma of skin) 06/01/2012    Leg Follows with Dr Margo Aye  . Headache(784.0) 08/17/2012  . Bipolar disorder 01/17/2007    Qualifier: Diagnosis of  By: Everardo All MD, Cleophas Dunker   . 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 Arthritis 07/24/2013    Likely inflammatory and following with Dr Maryln Gottron of [facility]  rheumatology  . Diverticulosis   . Hypothyroidism 08/24/2006    Qualifier: Diagnosis of  By: Everardo All MD, Sean A    . 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 Obesity 11/01/2013    History   Social History  . Marital Status: Married    Spouse Name: N/A  . Number of Children: 2  . Years of Education: N/A   Occupational History  . 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 Smoking status: Former Smoker -- 1.00 packs/day for 30 years    Types: Cigarettes    Quit date: 05/15/2009  . Smokeless tobacco: Never Used  . Alcohol Use: No  . Drug Use: No  .  Sexual Activity: No   Other Topics Concern  . Not on file   Social History Narrative    Past Surgical History  Procedure Laterality Date  . Tubal ligation  1995  . Uterine suspension      mesh  . Colonoscopy    . Gated spect wall motion stress cardiolite  11/05/2001  . Polypectomy    . Abdominal hysterectomy    . Abdominal hysterectomy  2011    complete  . Dilation and curettage of uterus  1985  . Eye surgery  03/03/14    Surgery on right eye for epiretinal membrane (vitreous peel)     Family History  Problem Relation Age of Onset  . Heart disease Mother     stents  . Stroke Mother   . Hyperlipidemia Mother   . Hypertension Mother   . Other Mother       blood disorder- mgus  . Colon polyps Father   . Other Father     aorta disection  . Aneurysm Father   . Colon cancer Paternal Uncle   . Irritable bowel syndrome Daughter   . Other Daughter     gastritis  . Mental illness Daughter     bipolar and mood disorder  . Hypertension Sister     ?  Marland Kitchen Mental illness Son     bipolar  . Arthritis Sister   . Other Sister     thyroid  . Other Sister     thyroid    Allergies  Allergen Reactions  . Keflex [Cephalexin] Diarrhea  . Aspirin     REACTION: difficulty breathing  . Dicyclomine Hcl     REACTION: mouth ulcers  . Metronidazole     REACTION: hives, mouth ulcers  . Moxifloxacin     REACTION: increased heart rate, nausea  . Nsaids     REACTION: difficulty breathing  . Phenergan [Promethazine Hcl]     "knocks" pt out for about 3 days  . Quetiapine     Somnolence. Slept for 36 hours straight.  . Sulfamethoxazole-Trimethoprim     REACTION: increased heart rate, nausea  . Cymbalta [Duloxetine Hcl] Rash    Current Outpatient Prescriptions on File Prior to Visit  Medication Sig Dispense Refill  . ALPRAZolam (XANAX) 0.25 MG tablet Take 1 tablet (0.25 mg total) by mouth 3 (three) times daily as needed for sleep or anxiety. 30 tablet 3  . calcium carbonate (TUMS E-X 750) 750 MG chewable tablet Chew 2 tablets by mouth daily.      . hydrocortisone (ANUSOL-HC) 25 MG suppository Place 1 suppository (25 mg total) rectally at bedtime as needed for hemorrhoids or itching. 12 suppository 1  . levothyroxine (SYNTHROID, LEVOTHROID) 100 MCG tablet Take 1 tablet (100 mcg total) by mouth daily before breakfast. 30 tablet 3  . Loperamide HCl (IMODIUM A-D PO) Take by mouth.    . loratadine (CLARITIN) 10 MG tablet Take 1 tablet (10 mg total) by mouth 2 (two) times daily as needed for allergies or itching (dermatitis). 30 tablet 5  . metoprolol tartrate (LOPRESSOR) 25 MG tablet Take 0.5 tablets (12.5 mg total) by mouth 2 (two) times daily. 45 tablet  3  . nitroGLYCERIN (NITROSTAT) 0.4 MG SL tablet Place 0.4 mg under the tongue every 5 (five) minutes as needed.      . Probiotic Product (PROBIOTIC FORMULA) CAPS Take 1 capsule by mouth daily.      . ranitidine (ZANTAC) 150 MG tablet Take 1 tablet (150 mg total)  by mouth 2 (two) times daily. 180 tablet 1  . sucralfate (CARAFATE) 1 G tablet Take 1 tablet (1 g total) by mouth 4 (four) times daily -  with meals and at bedtime. 120 tablet 3  . SUMAtriptan (IMITREX) 50 MG tablet Take 1 tablet (50 mg total) by mouth once. May repeat in 2 hours if headache persists or recurs. 10 tablet 0   No current facility-administered medications on file prior to visit.    BP 109/73 mmHg  Pulse 80  Temp(Src) 98 F (36.7 C) (Oral)  Resp 16  Ht 5\' 6"  (1.676 m)  Wt 206 lb 9.6 oz (93.713 kg)  BMI 33.36 kg/m2  SpO2 99%    Objective:   Physical Exam  Constitutional: She is oriented to person, place, and time. She appears well-developed.  HENT:  Head: Normocephalic.  Cardiovascular: Normal rate and regular rhythm.   Pulmonary/Chest: Effort normal and breath sounds normal.  Neurological: She is alert and oriented to person, place, and time.  Skin: Skin is warm and dry.  Psychiatric: She has a normal mood and affect.          Assessment & Plan:  Pain contract up to date. She will complete a UDS prior to leaving this afternoon.

## 2014-03-16 NOTE — Assessment & Plan Note (Addendum)
Stable. Out of pain medications for chronic back pain. Refilled Hydrocodone without refills and encouraged patient to make an appointment with Dr. Randel Pigg for further refills and evaluation.  Patient pain contract still up-to-date and patient to complete urine drug screen prior to leaving this afternoon.

## 2014-03-16 NOTE — Progress Notes (Signed)
Pre visit review using our clinic review tool, if applicable. No additional management support is needed unless otherwise documented below in the visit note. 

## 2014-03-22 NOTE — Addendum Note (Signed)
Addended by: Pleas Koch on: 03/22/2014 01:00 PM   Modules accepted: Level of Service

## 2014-03-26 ENCOUNTER — Telehealth: Payer: Self-pay | Admitting: Family Medicine

## 2014-03-26 NOTE — Telephone Encounter (Signed)
Caller name: Dymphna Relation to pt: self Call back number: [phone] Pharmacy: [facility] Reason for call:   Patient states that she had told Alma Friendly, NP at last visit that she could not take cymbalta(made patient "break out")She is requesting an alternative medication. Patient is out and is requesting this to be done today.

## 2014-03-29 MED ORDER — VENLAFAXINE HCL ER 37.5 MG PO CP24: 37.5000 mg | ORAL_CAPSULE | Freq: Every day | ORAL | Status: DC

## 2014-03-29 NOTE — Telephone Encounter (Signed)
Patient informed of PCP instructions.  Sent in venlafaxine to [facility].  Patient has a physical scheduled for April and can discuss with PCP at that appt.

## 2014-03-29 NOTE — Telephone Encounter (Signed)
She can try Venlafaxine XR 37.5 mg tab 1 tab po daily disp #30 with 1 rf. It is similar to Cymbalta and I cannot switch to a whole new class without seeing her. She needs to see me in the next month or see psychiatry

## 2014-04-01 ENCOUNTER — Other Ambulatory Visit: Payer: Self-pay

## 2014-04-01 DIAGNOSIS — Z1231 Encounter for screening mammogram for malignant neoplasm of breast: Secondary | ICD-10-CM

## 2014-04-09 ENCOUNTER — Ambulatory Visit: Payer: Managed Care, Other (non HMO)

## 2014-04-12 ENCOUNTER — Telehealth: Payer: Self-pay | Admitting: Family Medicine

## 2014-04-12 ENCOUNTER — Other Ambulatory Visit: Payer: Self-pay | Admitting: Family Medicine

## 2014-04-12 ENCOUNTER — Telehealth: Payer: Self-pay | Admitting: Internal Medicine

## 2014-04-12 ENCOUNTER — Encounter: Payer: Self-pay | Admitting: Family Medicine

## 2014-04-12 MED ORDER — LORCASERIN HCL 10 MG PO TABS: 1.0000 | ORAL_TABLET | Freq: Two times a day (BID) | ORAL | Status: DC

## 2014-04-12 NOTE — Telephone Encounter (Signed)
Patient states she has gotten Zofran from Dr. Henrene Pastor in the past, although her last refill was through her pcp.  Last office visit 09/2013.  Patient having some increased nausea at this time.  Ok to send Zofran? And at what strength and dosage?  Thanks!

## 2014-04-12 NOTE — Telephone Encounter (Signed)
Pre visit letter sent  °

## 2014-04-13 NOTE — Telephone Encounter (Signed)
Ok to refill zofran 4 mg q 4 hours prn; #30. 1 refill

## 2014-04-15 MED ORDER — ONDANSETRON HCL 4 MG PO TABS: 4.0000 mg | ORAL_TABLET | ORAL | Status: DC | PRN

## 2014-04-15 NOTE — Telephone Encounter (Signed)
Sent rx for Zofran per Dr. Henrene Pastor.  Called patient to let her know.  Patient aware

## 2014-04-16 ENCOUNTER — Ambulatory Visit: Payer: Managed Care, Other (non HMO)

## 2014-04-29 ENCOUNTER — Telehealth: Payer: Self-pay | Admitting: *Deleted

## 2014-04-29 ENCOUNTER — Encounter: Payer: Self-pay | Admitting: *Deleted

## 2014-04-29 NOTE — Telephone Encounter (Signed)
Pre-Visit Call completed with patient and chart updated.   Pre-Visit Info documented in Specialty Comments under [employer].    

## 2014-04-30 ENCOUNTER — Ambulatory Visit (INDEPENDENT_AMBULATORY_CARE_PROVIDER_SITE_OTHER): Payer: Managed Care, Other (non HMO) | Admitting: Family Medicine

## 2014-04-30 ENCOUNTER — Encounter: Payer: Self-pay | Admitting: Family Medicine

## 2014-04-30 VITALS — BP 137/87 | HR 65 | Temp 97.7°F | Resp 18 | Ht 66.5 in | Wt 205.0 lb

## 2014-04-30 DIAGNOSIS — E039 Hypothyroidism, unspecified: Secondary | ICD-10-CM | POA: Diagnosis not present

## 2014-04-30 DIAGNOSIS — F418 Other specified anxiety disorders: Secondary | ICD-10-CM

## 2014-04-30 DIAGNOSIS — I10 Essential (primary) hypertension: Secondary | ICD-10-CM

## 2014-04-30 DIAGNOSIS — L508 Other urticaria: Secondary | ICD-10-CM

## 2014-04-30 DIAGNOSIS — E669 Obesity, unspecified: Secondary | ICD-10-CM | POA: Diagnosis not present

## 2014-04-30 DIAGNOSIS — K219 Gastro-esophageal reflux disease without esophagitis: Secondary | ICD-10-CM

## 2014-04-30 DIAGNOSIS — Z1239 Encounter for other screening for malignant neoplasm of breast: Secondary | ICD-10-CM

## 2014-04-30 DIAGNOSIS — E782 Mixed hyperlipidemia: Secondary | ICD-10-CM

## 2014-04-30 DIAGNOSIS — Z0000 Encounter for general adult medical examination without abnormal findings: Secondary | ICD-10-CM

## 2014-04-30 DIAGNOSIS — D649 Anemia, unspecified: Secondary | ICD-10-CM | POA: Diagnosis not present

## 2014-04-30 DIAGNOSIS — G43809 Other migraine, not intractable, without status migrainosus: Secondary | ICD-10-CM | POA: Diagnosis not present

## 2014-04-30 DIAGNOSIS — F329 Major depressive disorder, single episode, unspecified: Secondary | ICD-10-CM

## 2014-04-30 DIAGNOSIS — F32A Depression, unspecified: Secondary | ICD-10-CM

## 2014-04-30 DIAGNOSIS — T7840XD Allergy, unspecified, subsequent encounter: Secondary | ICD-10-CM

## 2014-04-30 DIAGNOSIS — Z780 Asymptomatic menopausal state: Secondary | ICD-10-CM

## 2014-04-30 DIAGNOSIS — IMO0001 Reserved for inherently not codable concepts without codable children: Secondary | ICD-10-CM

## 2014-04-30 DIAGNOSIS — R1013 Epigastric pain: Secondary | ICD-10-CM

## 2014-04-30 DIAGNOSIS — M5441 Lumbago with sciatica, right side: Secondary | ICD-10-CM

## 2014-04-30 LAB — CBC WITH DIFFERENTIAL/PLATELET
BASOS ABS: 0 10*3/uL (ref 0.0–0.1)
Basophils Relative: 0.5 % (ref 0.0–3.0)
Eosinophils Absolute: 0.1 10*3/uL (ref 0.0–0.7)
Eosinophils Relative: 2.4 % (ref 0.0–5.0)
HCT: 35.2 % — ABNORMAL LOW (ref 36.0–46.0)
HEMOGLOBIN: 12 g/dL (ref 12.0–15.0)
Lymphocytes Relative: 44 % (ref 12.0–46.0)
Lymphs Abs: 2.1 10*3/uL (ref 0.7–4.0)
MCHC: 34.1 g/dL (ref 30.0–36.0)
MCV: 85 fl (ref 78.0–100.0)
MONO ABS: 0.4 10*3/uL (ref 0.1–1.0)
MONOS PCT: 9.3 % (ref 3.0–12.0)
NEUTROS ABS: 2.1 10*3/uL (ref 1.4–7.7)
Neutrophils Relative %: 43.8 % (ref 43.0–77.0)
PLATELETS: 218 10*3/uL (ref 150.0–400.0)
RBC: 4.14 Mil/uL (ref 3.87–5.11)
RDW: 13.4 % (ref 11.5–15.5)
WBC: 4.8 10*3/uL (ref 4.0–10.5)

## 2014-04-30 LAB — HEPATIC FUNCTION PANEL
ALT: 16 U/L (ref 0–35)
AST: 18 U/L (ref 0–37)
Albumin: 4.3 g/dL (ref 3.5–5.2)
Alkaline Phosphatase: 99 U/L (ref 39–117)
BILIRUBIN TOTAL: 0.7 mg/dL (ref 0.2–1.2)
Bilirubin, Direct: 0.1 mg/dL (ref 0.0–0.3)
Total Protein: 7.1 g/dL (ref 6.0–8.3)

## 2014-04-30 LAB — RENAL FUNCTION PANEL
Albumin: 4.3 g/dL (ref 3.5–5.2)
BUN: 10 mg/dL (ref 6–23)
CO2: 27 mEq/L (ref 19–32)
CREATININE: 0.92 mg/dL (ref 0.40–1.20)
Calcium: 9.4 mg/dL (ref 8.4–10.5)
Chloride: 104 mEq/L (ref 96–112)
GFR: 67.77 mL/min (ref >60.00)
GLUCOSE: 86 mg/dL (ref 70–99)
Phosphorus: 4 mg/dL (ref 2.3–4.6)
Potassium: 3.8 mEq/L (ref 3.5–5.1)
SODIUM: 138 meq/L (ref 135–145)

## 2014-04-30 LAB — LIPID PANEL
CHOL/HDL RATIO: 5
Cholesterol: 211 mg/dL — ABNORMAL HIGH (ref 0–200)
HDL: 44.3 mg/dL (ref >39.00)
NonHDL: 166.7
TRIGLYCERIDES: 224 mg/dL — AB (ref 0.0–149.0)
VLDL: 44.8 mg/dL — ABNORMAL HIGH (ref 0.0–40.0)

## 2014-04-30 LAB — TSH: TSH: 0.55 u[IU]/mL (ref 0.35–4.50)

## 2014-04-30 LAB — LDL CHOLESTEROL, DIRECT: Direct LDL: 131 mg/dL

## 2014-04-30 MED ORDER — ALPRAZOLAM 0.25 MG PO TABS: 0.2500 mg | ORAL_TABLET | Freq: Three times a day (TID) | ORAL | Status: DC | PRN

## 2014-04-30 MED ORDER — VENLAFAXINE HCL ER 75 MG PO CP24: 75.0000 mg | ORAL_CAPSULE | Freq: Every day | ORAL | Status: DC

## 2014-04-30 MED ORDER — HYDROCODONE-ACETAMINOPHEN 10-325 MG PO TABS: 1.0000 | ORAL_TABLET | Freq: Four times a day (QID) | ORAL | Status: DC | PRN

## 2014-04-30 MED ORDER — METOPROLOL TARTRATE 25 MG PO TABS: 12.5000 mg | ORAL_TABLET | Freq: Two times a day (BID) | ORAL | Status: DC

## 2014-04-30 MED ORDER — RANITIDINE HCL 150 MG PO TABS: 150.0000 mg | ORAL_TABLET | Freq: Two times a day (BID) | ORAL | Status: DC

## 2014-04-30 MED ORDER — SUMATRIPTAN SUCCINATE 50 MG PO TABS
50.0000 mg | ORAL_TABLET | Freq: Once | ORAL | Status: DC
Start: 2014-04-30 — End: 2015-03-01

## 2014-04-30 MED ORDER — LORCASERIN HCL 10 MG PO TABS: 1.0000 | ORAL_TABLET | Freq: Two times a day (BID) | ORAL | Status: DC

## 2014-04-30 MED ORDER — LEVOTHYROXINE SODIUM 100 MCG PO TABS: 100.0000 ug | ORAL_TABLET | Freq: Every day | ORAL | Status: DC

## 2014-04-30 MED ORDER — LORATADINE 10 MG PO TABS: 10.0000 mg | ORAL_TABLET | Freq: Two times a day (BID) | ORAL | Status: DC | PRN

## 2014-04-30 MED ORDER — SUCRALFATE 1 G PO TABS: 1.0000 g | ORAL_TABLET | Freq: Three times a day (TID) | ORAL | Status: DC

## 2014-04-30 MED ORDER — LOSARTAN POTASSIUM 25 MG PO TABS: 25.0000 mg | ORAL_TABLET | Freq: Every day | ORAL | Status: DC

## 2014-04-30 NOTE — Patient Instructions (Addendum)
Rel of rec Dr Kellie Moor [facility] clinic in [place]  Try Anusol  suppositories if any bleeding   Preventive Care for Adults A healthy lifestyle and preventive care can promote health and wellness. Preventive health guidelines for women include the following key practices.  A routine yearly physical is a good way to check with your health care provider about your health and preventive screening. It is a chance to share any concerns and updates on your health and to receive a thorough exam.  Visit your dentist for a routine exam and preventive care every 6 months. Brush your teeth twice a day and floss once a day. Good oral hygiene prevents tooth decay and gum disease.  The frequency of eye exams is based on your age, health, family medical history, use of contact lenses, and other factors. Follow your health care provider's recommendations for frequency of eye exams.  Eat a healthy diet. Foods like vegetables, fruits, whole grains, low-fat dairy products, and lean protein foods contain the nutrients you need without too many calories. Decrease your intake of foods high in solid fats, added sugars, and salt. Eat the right amount of calories for you.Get information about a proper diet from your health care provider, if necessary.  Regular physical exercise is one of the most important things you can do for your health. Most adults should get at least 150 minutes of moderate-intensity exercise (any activity that increases your heart rate and causes you to sweat) each week. In addition, most adults need muscle-strengthening exercises on 2 or more days a week.  Maintain a healthy weight. The body mass index (BMI) is a screening tool to identify possible weight problems. It provides an estimate of body fat based on height and weight. Your health care provider can find your BMI and can help you achieve or maintain a healthy weight.For adults 20 years and older:  A BMI below 18.5 is considered  underweight.  A BMI of 18.5 to 24.9 is normal.  A BMI of 25 to 29.9 is considered overweight.  A BMI of 30 and above is considered obese.  Maintain normal blood lipids and cholesterol levels by exercising and minimizing your intake of saturated fat. Eat a balanced diet with plenty of fruit and vegetables. Blood tests for lipids and cholesterol should begin at age 64 and be repeated every 5 years. If your lipid or cholesterol levels are high, you are over 50, or you are at high risk for heart disease, you may need your cholesterol levels checked more frequently.Ongoing high lipid and cholesterol levels should be treated with medicines if diet and exercise are not working.  If you smoke, find out from your health care provider how to quit. If you do not use tobacco, do not start.  Lung cancer screening is recommended for adults aged 28-80 years who are at high risk for developing lung cancer because of a history of smoking. A yearly low-dose CT scan of the lungs is recommended for people who have at least a 30-pack-year history of smoking and are a current smoker or have quit within the past 15 years. A pack year of smoking is smoking an average of 1 pack of cigarettes a day for 1 year (for example: 1 pack a day for 30 years or 2 packs a day for 15 years). Yearly screening should continue until the smoker has stopped smoking for at least 15 years. Yearly screening should be stopped for people who develop a health problem that would  prevent them from having lung cancer treatment.  If you are pregnant, do not drink alcohol. If you are breastfeeding, be very cautious about drinking alcohol. If you are not pregnant and choose to drink alcohol, do not have more than 1 drink per day. One drink is considered to be 12 ounces (355 mL) of beer, 5 ounces (148 mL) of wine, or 1.5 ounces (44 mL) of liquor.  Avoid use of street drugs. Do not share needles with anyone. Ask for help if you need support or  instructions about stopping the use of drugs.  High blood pressure causes heart disease and increases the risk of stroke. Your blood pressure should be checked at least every 1 to 2 years. Ongoing high blood pressure should be treated with medicines if weight loss and exercise do not work.  If you are 68-64 years old, ask your health care provider if you should take aspirin to prevent strokes.  Diabetes screening involves taking a blood sample to check your fasting blood sugar level. This should be done once every 3 years, after age 22, if you are within normal weight and without risk factors for diabetes. Testing should be considered at a younger age or be carried out more frequently if you are overweight and have at least 1 risk factor for diabetes.  Breast cancer screening is essential preventive care for women. You should practice "breast self-awareness." This means understanding the normal appearance and feel of your breasts and may include breast self-examination. Any changes detected, no matter how small, should be reported to a health care provider. Women in their 17s and 30s should have a clinical breast exam (CBE) by a health care provider as part of a regular health exam every 1 to 3 years. After age 37, women should have a CBE every year. Starting at age 38, women should consider having a mammogram (breast X-ray test) every year. Women who have a family history of breast cancer should talk to their health care provider about genetic screening. Women at a high risk of breast cancer should talk to their health care providers about having an MRI and a mammogram every year.  Breast cancer gene (BRCA)-related cancer risk assessment is recommended for women who have family members with BRCA-related cancers. BRCA-related cancers include breast, ovarian, tubal, and peritoneal cancers. Having family members with these cancers may be associated with an increased risk for harmful changes (mutations) in  the breast cancer genes BRCA1 and BRCA2. Results of the assessment will determine the need for genetic counseling and BRCA1 and BRCA2 testing.  Routine pelvic exams to screen for cancer are no longer recommended for nonpregnant women who are considered low risk for cancer of the pelvic organs (ovaries, uterus, and vagina) and who do not have symptoms. Ask your health care provider if a screening pelvic exam is right for you.  If you have had past treatment for cervical cancer or a condition that could lead to cancer, you need Pap tests and screening for cancer for at least 20 years after your treatment. If Pap tests have been discontinued, your risk factors (such as having a new sexual partner) need to be reassessed to determine if screening should be resumed. Some women have medical problems that increase the chance of getting cervical cancer. In these cases, your health care provider may recommend more frequent screening and Pap tests.  The HPV test is an additional test that may be used for cervical cancer screening. The HPV test looks for  the virus that can cause the cell changes on the cervix. The cells collected during the Pap test can be tested for HPV. The HPV test could be used to screen women aged 47 years and older, and should be used in women of any age who have unclear Pap test results. After the age of 74, women should have HPV testing at the same frequency as a Pap test.  Colorectal cancer can be detected and often prevented. Most routine colorectal cancer screening begins at the age of 65 years and continues through age 9 years. However, your health care provider may recommend screening at an earlier age if you have risk factors for colon cancer. On a yearly basis, your health care provider may provide home test kits to check for hidden blood in the stool. Use of a small camera at the end of a tube, to directly examine the colon (sigmoidoscopy or colonoscopy), can detect the earliest forms  of colorectal cancer. Talk to your health care provider about this at age 40, when routine screening begins. Direct exam of the colon should be repeated every 5-10 years through age 54 years, unless early forms of pre-cancerous polyps or small growths are found.  People who are at an increased risk for hepatitis B should be screened for this virus. You are considered at high risk for hepatitis B if:  You were born in a country where hepatitis B occurs often. Talk with your health care provider about which countries are considered high risk.  Your parents were born in a high-risk country and you have not received a shot to protect against hepatitis B (hepatitis B vaccine).  You have HIV or AIDS.  You use needles to inject street drugs.  You live with, or have sex with, someone who has hepatitis B.  You get hemodialysis treatment.  You take certain medicines for conditions like cancer, organ transplantation, and autoimmune conditions.  Hepatitis C blood testing is recommended for all people born from [birth year] through 1965 and any individual with known risks for hepatitis C.  Practice safe sex. Use condoms and avoid high-risk sexual practices to reduce the spread of sexually transmitted infections (STIs). STIs include gonorrhea, chlamydia, syphilis, trichomonas, herpes, HPV, and human immunodeficiency virus (HIV). Herpes, HIV, and HPV are viral illnesses that have no cure. They can result in disability, cancer, and death.  You should be screened for sexually transmitted illnesses (STIs) including gonorrhea and chlamydia if:  You are sexually active and are younger than 24 years.  You are older than 24 years and your health care provider tells you that you are at risk for this type of infection.  Your sexual activity has changed since you were last screened and you are at an increased risk for chlamydia or gonorrhea. Ask your health care provider if you are at risk.  If you are at risk of  being infected with HIV, it is recommended that you take a prescription medicine daily to prevent HIV infection. This is called preexposure prophylaxis (PrEP). You are considered at risk if:  You are a heterosexual woman, are sexually active, and are at increased risk for HIV infection.  You take drugs by injection.  You are sexually active with a partner who has HIV.  Talk with your health care provider about whether you are at high risk of being infected with HIV. If you choose to begin PrEP, you should first be tested for HIV. You should then be tested every 3 months for  as long as you are taking PrEP.  Osteoporosis is a disease in which the bones lose minerals and strength with aging. This can result in serious bone fractures or breaks. The risk of osteoporosis can be identified using a bone density scan. Women ages 65 years and over and women at risk for fractures or osteoporosis should discuss screening with their health care providers. Ask your health care provider whether you should take a calcium supplement or vitamin D to reduce the rate of osteoporosis.  Menopause can be associated with physical symptoms and risks. Hormone replacement therapy is available to decrease symptoms and risks. You should talk to your health care provider about whether hormone replacement therapy is right for you.  Use sunscreen. Apply sunscreen liberally and repeatedly throughout the day. You should seek shade when your shadow is shorter than you. Protect yourself by wearing long sleeves, pants, a wide-brimmed hat, and sunglasses year round, whenever you are outdoors.  Once a month, do a whole body skin exam, using a mirror to look at the skin on your back. Tell your health care provider of new moles, moles that have irregular borders, moles that are larger than a pencil eraser, or moles that have changed in shape or color.  Stay current with required vaccines (immunizations).  Influenza vaccine. All adults  should be immunized every year.  Tetanus, diphtheria, and acellular pertussis (Td, Tdap) vaccine. Pregnant women should receive 1 dose of Tdap vaccine during each pregnancy. The dose should be obtained regardless of the length of time since the last dose. Immunization is preferred during the 27th-36th week of gestation. An adult who has not previously received Tdap or who does not know her vaccine status should receive 1 dose of Tdap. This initial dose should be followed by tetanus and diphtheria toxoids (Td) booster doses every 10 years. Adults with an unknown or incomplete history of completing a 3-dose immunization series with Td-containing vaccines should begin or complete a primary immunization series including a Tdap dose. Adults should receive a Td booster every 10 years.  Varicella vaccine. An adult without evidence of immunity to varicella should receive 2 doses or a second dose if she has previously received 1 dose. Pregnant females who do not have evidence of immunity should receive the first dose after pregnancy. This first dose should be obtained before leaving the health care facility. The second dose should be obtained 4-8 weeks after the first dose.  Human papillomavirus (HPV) vaccine. Females aged 13-26 years who have not received the vaccine previously should obtain the 3-dose series. The vaccine is not recommended for use in pregnant females. However, pregnancy testing is not needed before receiving a dose. If a female is found to be pregnant after receiving a dose, no treatment is needed. In that case, the remaining doses should be delayed until after the pregnancy. Immunization is recommended for any person with an immunocompromised condition through the age of 5 years if she did not get any or all doses earlier. During the 3-dose series, the second dose should be obtained 4-8 weeks after the first dose. The third dose should be obtained 24 weeks after the first dose and 16 weeks after  the second dose.  Zoster vaccine. One dose is recommended for adults aged 48 years or older unless certain conditions are present.  Measles, mumps, and rubella (MMR) vaccine. Adults born before [birth year] generally are considered immune to measles and mumps. Adults born in [birth year] or later should have 1 or more  doses of MMR vaccine unless there is a contraindication to the vaccine or there is laboratory evidence of immunity to each of the three diseases. A routine second dose of MMR vaccine should be obtained at least 28 days after the first dose for students attending postsecondary schools, health care workers, or international travelers. People who received inactivated measles vaccine or an unknown type of measles vaccine during 1963-1967 should receive 2 doses of MMR vaccine. People who received inactivated mumps vaccine or an unknown type of mumps vaccine before 1979 and are at high risk for mumps infection should consider immunization with 2 doses of MMR vaccine. For females of childbearing age, rubella immunity should be determined. If there is no evidence of immunity, females who are not pregnant should be vaccinated. If there is no evidence of immunity, females who are pregnant should delay immunization until after pregnancy. Unvaccinated health care workers born before [birth year] who lack laboratory evidence of measles, mumps, or rubella immunity or laboratory confirmation of disease should consider measles and mumps immunization with 2 doses of MMR vaccine or rubella immunization with 1 dose of MMR vaccine.  Pneumococcal 13-valent conjugate (PCV13) vaccine. When indicated, a person who is uncertain of her immunization history and has no record of immunization should receive the PCV13 vaccine. An adult aged 55 years or older who has certain medical conditions and has not been previously immunized should receive 1 dose of PCV13 vaccine. This PCV13 should be followed with a dose of pneumococcal polysaccharide (PPSV23)  vaccine. The PPSV23 vaccine dose should be obtained at least 8 weeks after the dose of PCV13 vaccine. An adult aged 47 years or older who has certain medical conditions and previously received 1 or more doses of PPSV23 vaccine should receive 1 dose of PCV13. The PCV13 vaccine dose should be obtained 1 or more years after the last PPSV23 vaccine dose.  Pneumococcal polysaccharide (PPSV23) vaccine. When PCV13 is also indicated, PCV13 should be obtained first. All adults aged 7 years and older should be immunized. An adult younger than age 26 years who has certain medical conditions should be immunized. Any person who resides in a nursing home or long-term care facility should be immunized. An adult smoker should be immunized. People with an immunocompromised condition and certain other conditions should receive both PCV13 and PPSV23 vaccines. People with human immunodeficiency virus (HIV) infection should be immunized as soon as possible after diagnosis. Immunization during chemotherapy or radiation therapy should be avoided. Routine use of PPSV23 vaccine is not recommended for American Indians, [place] Natives, or people younger than 65 years unless there are medical conditions that require PPSV23 vaccine. When indicated, people who have unknown immunization and have no record of immunization should receive PPSV23 vaccine. One-time revaccination 5 years after the first dose of PPSV23 is recommended for people aged 19-64 years who have chronic kidney failure, nephrotic syndrome, asplenia, or immunocompromised conditions. People who received 1-2 doses of PPSV23 before age 21 years should receive another dose of PPSV23 vaccine at age 36 years or later if at least 5 years have passed since the previous dose. Doses of PPSV23 are not needed for people immunized with PPSV23 at or after age 9 years.  Meningococcal vaccine. Adults with asplenia or persistent complement component deficiencies should receive 2 doses of  quadrivalent meningococcal conjugate (MenACWY-D) vaccine. The doses should be obtained at least 2 months apart. Microbiologists working with certain meningococcal bacteria, [place] recruits, people at risk during an outbreak, and people who travel to  or live in countries with a high rate of meningitis should be immunized. A first-year college student up through age 67 years who is living in a residence hall should receive a dose if she did not receive a dose on or after her 16th birthday. Adults who have certain high-risk conditions should receive one or more doses of vaccine.  Hepatitis A vaccine. Adults who wish to be protected from this disease, have certain high-risk conditions, work with hepatitis A-infected animals, work in hepatitis A research labs, or travel to or work in countries with a high rate of hepatitis A should be immunized. Adults who were previously unvaccinated and who anticipate close contact with an international adoptee during the first 60 days after arrival in the [place] from a country with a high rate of hepatitis A should be immunized.  Hepatitis B vaccine. Adults who wish to be protected from this disease, have certain high-risk conditions, may be exposed to blood or other infectious body fluids, are household contacts or sex partners of hepatitis B positive people, are clients or workers in certain care facilities, or travel to or work in countries with a high rate of hepatitis B should be immunized.  Haemophilus influenzae type b (Hib) vaccine. A previously unvaccinated person with asplenia or sickle cell disease or having a scheduled splenectomy should receive 1 dose of Hib vaccine. Regardless of previous immunization, a recipient of a hematopoietic stem cell transplant should receive a 3-dose series 6-12 months after her successful transplant. Hib vaccine is not recommended for adults with HIV infection. Preventive Services / Frequency Ages 21 to 93 years  Blood  pressure check.** / Every 1 to 2 years.  Lipid and cholesterol check.** / Every 5 years beginning at age 72.  Clinical breast exam.** / Every 3 years for women in their 13s and 83s.  BRCA-related cancer risk assessment.** / For women who have family members with a BRCA-related cancer (breast, ovarian, tubal, or peritoneal cancers).  Pap test.** / Every 2 years from ages 42 through 64. Every 3 years starting at age 61 through age 96 or 50 with a history of 3 consecutive normal Pap tests.  HPV screening.** / Every 3 years from ages 28 through ages 9 to 70 with a history of 3 consecutive normal Pap tests.  Hepatitis C blood test.** / For any individual with known risks for hepatitis C.  Skin self-exam. / Monthly.  Influenza vaccine. / Every year.  Tetanus, diphtheria, and acellular pertussis (Tdap, Td) vaccine.** / Consult your health care provider. Pregnant women should receive 1 dose of Tdap vaccine during each pregnancy. 1 dose of Td every 10 years.  Varicella vaccine.** / Consult your health care provider. Pregnant females who do not have evidence of immunity should receive the first dose after pregnancy.  HPV vaccine. / 3 doses over 6 months, if 10 and younger. The vaccine is not recommended for use in pregnant females. However, pregnancy testing is not needed before receiving a dose.  Measles, mumps, rubella (MMR) vaccine.** / You need at least 1 dose of MMR if you were born in 1957 or later. You may also need a 2nd dose. For females of childbearing age, rubella immunity should be determined. If there is no evidence of immunity, females who are not pregnant should be vaccinated. If there is no evidence of immunity, females who are pregnant should delay immunization until after pregnancy.  Pneumococcal 13-valent conjugate (PCV13) vaccine.** / Consult your health care provider.  Pneumococcal polysaccharide (  PPSV23) vaccine.** / 1 to 2 doses if you smoke cigarettes or if you have certain  conditions.  Meningococcal vaccine.** / 1 dose if you are age 27 to 19 years and a Market researcher living in a residence hall, or have one of several medical conditions, you need to get vaccinated against meningococcal disease. You may also need additional booster doses.  Hepatitis A vaccine.** / Consult your health care provider.  Hepatitis B vaccine.** / Consult your health care provider.  Haemophilus influenzae type b (Hib) vaccine.** / Consult your health care provider. Ages 75 to 79 years  Blood pressure check.** / Every 1 to 2 years.  Lipid and cholesterol check.** / Every 5 years beginning at age 29 years.  Lung cancer screening. / Every year if you are aged 36-80 years and have a 30-pack-year history of smoking and currently smoke or have quit within the past 15 years. Yearly screening is stopped once you have quit smoking for at least 15 years or develop a health problem that would prevent you from having lung cancer treatment.  Clinical breast exam.** / Every year after age 55 years.  BRCA-related cancer risk assessment.** / For women who have family members with a BRCA-related cancer (breast, ovarian, tubal, or peritoneal cancers).  Mammogram.** / Every year beginning at age 26 years and continuing for as long as you are in good health. Consult with your health care provider.  Pap test.** / Every 3 years starting at age 5 years through age 69 or 40 years with a history of 3 consecutive normal Pap tests.  HPV screening.** / Every 3 years from ages 33 years through ages 66 to 61 years with a history of 3 consecutive normal Pap tests.  Fecal occult blood test (FOBT) of stool. / Every year beginning at age 34 years and continuing until age 59 years. You may not need to do this test if you get a colonoscopy every 10 years.  Flexible sigmoidoscopy or colonoscopy.** / Every 5 years for a flexible sigmoidoscopy or every 10 years for a colonoscopy beginning at age 50 years  and continuing until age 24 years.  Hepatitis C blood test.** / For all people born from [birth year] through 1965 and any individual with known risks for hepatitis C.  Skin self-exam. / Monthly.  Influenza vaccine. / Every year.  Tetanus, diphtheria, and acellular pertussis (Tdap/Td) vaccine.** / Consult your health care provider. Pregnant women should receive 1 dose of Tdap vaccine during each pregnancy. 1 dose of Td every 10 years.  Varicella vaccine.** / Consult your health care provider. Pregnant females who do not have evidence of immunity should receive the first dose after pregnancy.  Zoster vaccine.** / 1 dose for adults aged 39 years or older.  Measles, mumps, rubella (MMR) vaccine.** / You need at least 1 dose of MMR if you were born in 1957 or later. You may also need a 2nd dose. For females of childbearing age, rubella immunity should be determined. If there is no evidence of immunity, females who are not pregnant should be vaccinated. If there is no evidence of immunity, females who are pregnant should delay immunization until after pregnancy.  Pneumococcal 13-valent conjugate (PCV13) vaccine.** / Consult your health care provider.  Pneumococcal polysaccharide (PPSV23) vaccine.** / 1 to 2 doses if you smoke cigarettes or if you have certain conditions.  Meningococcal vaccine.** / Consult your health care provider.  Hepatitis A vaccine.** / Consult your health care provider.  Hepatitis B vaccine.** /  Consult your health care provider.  Haemophilus influenzae type b (Hib) vaccine.** / Consult your health care provider. Ages 91 years and over  Blood pressure check.** / Every 1 to 2 years.  Lipid and cholesterol check.** / Every 5 years beginning at age 15 years.  Lung cancer screening. / Every year if you are aged 82-80 years and have a 30-pack-year history of smoking and currently smoke or have quit within the past 15 years. Yearly screening is stopped once you have quit smoking  for at least 15 years or develop a health problem that would prevent you from having lung cancer treatment.  Clinical breast exam.** / Every year after age 36 years.  BRCA-related cancer risk assessment.** / For women who have family members with a BRCA-related cancer (breast, ovarian, tubal, or peritoneal cancers).  Mammogram.** / Every year beginning at age 69 years and continuing for as long as you are in good health. Consult with your health care provider.  Pap test.** / Every 3 years starting at age 58 years through age 66 or 38 years with 3 consecutive normal Pap tests. Testing can be stopped between 65 and 70 years with 3 consecutive normal Pap tests and no abnormal Pap or HPV tests in the past 10 years.  HPV screening.** / Every 3 years from ages 68 years through ages 64 or 32 years with a history of 3 consecutive normal Pap tests. Testing can be stopped between 65 and 70 years with 3 consecutive normal Pap tests and no abnormal Pap or HPV tests in the past 10 years.  Fecal occult blood test (FOBT) of stool. / Every year beginning at age 48 years and continuing until age 63 years. You may not need to do this test if you get a colonoscopy every 10 years.  Flexible sigmoidoscopy or colonoscopy.** / Every 5 years for a flexible sigmoidoscopy or every 10 years for a colonoscopy beginning at age 34 years and continuing until age 94 years.  Hepatitis C blood test.** / For all people born from [birth year] through 1965 and any individual with known risks for hepatitis C.  Osteoporosis screening.** / A one-time screening for women ages 10 years and over and women at risk for fractures or osteoporosis.  Skin self-exam. / Monthly.  Influenza vaccine. / Every year.  Tetanus, diphtheria, and acellular pertussis (Tdap/Td) vaccine.** / 1 dose of Td every 10 years.  Varicella vaccine.** / Consult your health care provider.  Zoster vaccine.** / 1 dose for adults aged 31 years or older.  Pneumococcal  13-valent conjugate (PCV13) vaccine.** / Consult your health care provider.  Pneumococcal polysaccharide (PPSV23) vaccine.** / 1 dose for all adults aged 34 years and older.  Meningococcal vaccine.** / Consult your health care provider.  Hepatitis A vaccine.** / Consult your health care provider.  Hepatitis B vaccine.** / Consult your health care provider.  Haemophilus influenzae type b (Hib) vaccine.** / Consult your health care provider. ** Family history and personal history of risk and conditions may change your health care provider's recommendations. Document Released: 02/27/2001 Document Revised: 05/18/2013 Document Reviewed: 05/29/2010 [facility] Patient Information 2015 [place], [place]. This information is not intended to replace advice given to you by your health care provider. Make sure you discuss any questions you have with your health care provider.

## 2014-04-30 NOTE — Progress Notes (Signed)
Joan Robinson  [id number] March 24, 1960 04/30/2014      Progress Note-Follow Up  Subjective  Chief Complaint  No chief complaint on file.   HPI  Patient is a 54 y.o. female in today for routine medical care. Patient in today for annual care. Continues to struggle with low mood but no suicidal ideation. Anxiety persists. Follows with rheumatology, Dr. Dierdre Forth. Follows with ophthalmology, Dr. Lyn Records. Follows with dermatology, Dr. Margo Aye and follows with cardiology. And allergist at [facility] Dr. Francis Gaines. No recent fevers or acute illness. Denies CP/palp/SOB/HA/congestion/fevers/GI or GU c/o. Taking meds as prescribed  Past Medical History  Diagnosis Date  . Allergic rhinitis   . Adenomatous colon polyp   . Diverticulosis   . Colitis, Clostridium difficile 5/11,6/11  . Hypothyroidism   . Urinary incontinence   . PUD (peptic ulcer disease)   . Tobacco abuse   . Hypertension   . Hyperlipidemia   . Colon polyp     benign  . Anemia 10/10/2011  . Preventative health care 10/10/2011  . Sinusitis acute 10/10/2011  . Chicken pox as a child    X 2  . Mumps as a child  . Lower back pain   . 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 Staph aureus infection 12/18/2011    Recurrent lesions in nares  . Perimenopausal 01/16/2012  . Anxiety and depression 01/17/2007    Qualifier: Diagnosis of  By: Everardo All MD, Cleophas Dunker   . 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 Right knee pain 05/10/2012  . BCC (basal cell carcinoma of skin) 06/01/2012    Leg Follows with Dr Margo Aye  . Headache(784.0) 08/17/2012  . Bipolar disorder 01/17/2007    Qualifier: Diagnosis of  By: Everardo All MD, Cleophas Dunker   . 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 Arthritis 07/24/2013    Likely inflammatory and following with Dr Maryln Gottron of [facility]  rheumatology  . Diverticulosis   . Hypothyroidism 08/24/2006    Qualifier: Diagnosis of  By: Everardo All MD, Sean A    . 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 Obesity 11/01/2013    Past Surgical History    Procedure Laterality Date  . Tubal ligation  1995  . Uterine suspension      mesh  . Colonoscopy    . Gated spect wall motion stress cardiolite  11/05/2001  . Polypectomy    . Abdominal hysterectomy    . Abdominal hysterectomy  2011    complete  . Dilation and curettage of uterus  1985  . Vitrectomy Bilateral 03/03/14  . Eye surgery  03/03/14    Surgery on right eye for epiretinal membrane (vitreous peel)     Family History  Problem Relation Age of Onset  . Heart disease Mother     stents  . Stroke Mother   . Hyperlipidemia Mother   . Hypertension Mother   . Other Mother     blood disorder- mgus  . Colon polyps Father   . Other Father     aorta disection  . Aneurysm Father   . Colon cancer Paternal Uncle   . Irritable bowel syndrome Daughter   . Other Daughter     gastritis  . Mental illness Daughter     bipolar and mood disorder  . Hypertension Sister     ?  Marland Kitchen Mental illness Son     bipolar  . Arthritis Sister   . Other Sister     thyroid  . Other Sister     thyroid  History   Social History  . Marital Status: Married    Spouse Name: N/A  . Number of Children: 2  . Years of Education: N/A   Occupational History  . 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 Smoking status: Former Smoker -- 1.00 packs/day for 30 years    Types: Cigarettes    Quit date: 05/15/2009  . Smokeless tobacco: Never Used  . Alcohol Use: No  . Drug Use: No  . Sexual Activity: No   Other Topics Concern  . Not on file   Social History Narrative    Current Outpatient Prescriptions on File Prior to Visit  Medication Sig Dispense Refill  . amphetamine-dextroamphetamine (ADDERALL) 20 MG tablet Take 1 tablet (20 mg total) by mouth 2 (two) times daily. January 2016 rx 60 tablet 0  . calcium carbonate (TUMS E-X 750) 750 MG chewable tablet Chew 2 tablets by mouth daily.      . cycloSPORINE (SANDIMMUNE) 25 MG capsule Take 75 mg by mouth 2 (two) times daily.    Marland Kitchen  HYDROcodone-acetaminophen (NORCO) 10-325 MG per tablet Take 1 tablet by mouth every 6 (six) hours as needed. 70 tablet 0  . hydrocortisone (ANUSOL-HC) 25 MG suppository Place 1 suppository (25 mg total) rectally at bedtime as needed for hemorrhoids or itching. 12 suppository 1  . hydrOXYzine (ATARAX/VISTARIL) 25 MG tablet Take 25 mg by mouth at bedtime as needed.    . Loperamide HCl (IMODIUM A-D PO) Take by mouth.    . nitroGLYCERIN (NITROSTAT) 0.4 MG SL tablet Place 0.4 mg under the tongue every 5 (five) minutes as needed.      . ondansetron (ZOFRAN) 4 MG tablet Take 1 tablet (4 mg total) by mouth every 4 (four) hours as needed for nausea or vomiting. 30 tablet 1  . Probiotic Product (PROBIOTIC FORMULA) CAPS Take 1 capsule by mouth daily.      Marland Kitchen venlafaxine XR (EFFEXOR-XR) 37.5 MG 24 hr capsule Take 1 capsule (37.5 mg total) by mouth daily. 30 capsule 1   No current facility-administered medications on file prior to visit.    Allergies  Allergen Reactions  . Keflex [Cephalexin] Diarrhea  . Aspirin     REACTION: difficulty breathing  . Dicyclomine Hcl     REACTION: mouth ulcers  . Metronidazole     REACTION: hives, mouth ulcers  . Moxifloxacin     REACTION: increased heart rate, nausea  . Nsaids     REACTION: difficulty breathing  . Phenergan [Promethazine Hcl]     "knocks" pt out for about 3 days  . Quetiapine     Somnolence. Slept for 36 hours straight.  . Sulfamethoxazole-Trimethoprim     REACTION: increased heart rate, nausea  . Cymbalta [Duloxetine Hcl] Rash   Pre-Visit Info 04/29/2014 9:37 AM   Medication: Reviewed and updated (patient states she needs Belviq refill but it was printed 04/12/14 and not at front desk)  Preferred Pharmacy: [facility] [place], [place] - [id number]  Allergies verified: UTD   Immunization Status: Flu vaccine-- 10/31/13 Tdap-- 07/04/04  A/P:  Changes to FH, PSH or Personal Hx: Recent eye surgery- vitrectomy  03/03/14 Pap-- last 2013, per patient normal  MMG[facility] CAT 1: Neg Bone Density-- none found  CCS-- 03/19/12 with Yancey Flemings at [facility]- diverticulosis (recommended f/u 5 years) Eye Exam: 02/2014 per patient glaucoma  Care Teams Updated: Orpah Cobb, MD- Cardiology, Aram Beecham  Carolynne Edouard, MD- Ophthalmology, Alben Deeds, MD- Rheumatology, Alan Mulder, MD- Retina Specialist ED/Hospital/Urgent Care Visits: none recent   To Discuss with Provider: concerns for weight gain Review of Systems  Review of Systems  Constitutional: Negative for fever and malaise/fatigue.  HENT: Negative for congestion.   Eyes: Negative for discharge.  Respiratory: Negative for cough and shortness of breath.   Cardiovascular: Negative for chest pain, palpitations and leg swelling.  Gastrointestinal: Negative for nausea, abdominal pain and diarrhea.  Genitourinary: Negative for dysuria.  Musculoskeletal: Negative for falls.  Skin: Positive for itching and rash.  Neurological: Negative for loss of consciousness and headaches.  Endo/Heme/Allergies: Negative for polydipsia.  Psychiatric/Behavioral: Positive for depression and suicidal ideas. The patient is nervous/anxious. The patient does not have insomnia.     Objective  BP 137/87 mmHg  Pulse 65  Temp(Src) 97.7 F (36.5 C) (Oral)  Resp 18  Ht 5' 6.5" (1.689 m)  Wt 205 lb (92.987 kg)  BMI 32.60 kg/m2  SpO2 99%  Physical Exam  Physical Exam  Constitutional: She is oriented to person, place, and time and well-developed, well-nourished, and in no distress. No distress.  HENT:  Head: Normocephalic and atraumatic.  Right Ear: External ear normal.  Left Ear: External ear normal.  Nose: Nose normal.  Mouth/Throat: Oropharynx is clear and moist. No oropharyngeal exudate.  Eyes: Conjunctivae are normal. Pupils are equal, round, and reactive to light. Right eye exhibits no discharge. Left eye exhibits no  discharge. No scleral icterus.  Neck: Normal range of motion. Neck supple. No thyromegaly present.  Cardiovascular: Normal rate, regular rhythm, normal heart sounds and intact distal pulses.   No murmur heard. Pulmonary/Chest: Effort normal and breath sounds normal. No respiratory distress. She has no wheezes. She has no rales.  Abdominal: Soft. Bowel sounds are normal. She exhibits no distension and no mass. There is no tenderness.  Musculoskeletal: Normal range of motion. She exhibits no edema or tenderness.  Lymphadenopathy:    She has no cervical adenopathy.  Neurological: She is alert and oriented to person, place, and time. She has normal reflexes. No cranial nerve deficit. Coordination normal.  Skin: Skin is warm and dry. No rash noted. She is not diaphoretic.  Psychiatric: Mood, memory and affect normal.  Nursing note reviewed.   Lab Results  Component Value Date   TSH 1.08 01/05/2014   Lab Results  Component Value Date   WBC 6.3 01/05/2014   HGB 12.6 01/05/2014   HCT 38.0 01/05/2014   MCV 89.1 01/05/2014   PLT 256.0 01/05/2014   Lab Results  Component Value Date   CREATININE 1.0 10/27/2013   BUN 6 10/27/2013   NA 141 10/27/2013   K 4.1 10/27/2013   CL 105 10/27/2013   CO2 31 10/27/2013   Lab Results  Component Value Date   ALT 22 10/27/2013   AST 23 10/27/2013   ALKPHOS 92 10/27/2013   BILITOT 0.5 10/27/2013   Lab Results  Component Value Date   CHOL 212* 10/27/2013   Lab Results  Component Value Date   HDL 32.20* 10/27/2013   No results found for: [facility] Lab Results  Component Value Date   TRIG 303.0* 10/27/2013   Lab Results  Component Value Date   CHOLHDL 7 10/27/2013     Assessment & Plan  Essential hypertension Well controlled, no changes to meds. Encouraged heart healthy diet such as the DASH diet and exercise as tolerated.    GERD Avoid offending foods, start probiotics. Do not eat  large meals in late evening and consider raising  head of bed. Carafate prn   Hypothyroidism On Levothyroxine, continue to monitor   Urticaria, chronic Has had a recent flare so her allergist at [facility], Dr Staci Acosta started her on cyclosporine 75 mg po bid with improvement   Depression with anxiety Is doing fairly well but some low mood is noted. Will increase Effexor to 75 mg daily   Obesity Encouraged DASH diet, decrease po intake and increase exercise as tolerated. Needs 7-8 hours of sleep nightly. Avoid trans fats, eat small, frequent meals every 4-5 hours with lean proteins, complex carbs and healthy fats. Minimize simple carbs, GMO foods.   Preventative health care dexa scan ordered today. Has decided to switch her GYN here, has a very distant history of abnormal pap requiring LEEP, no gyn complaints. Will proceed at next visit. Patient encouraged to maintain heart healthy diet, regular exercise, adequate sleep. Consider daily probiotics. Take medications as prescribed. Labs ordered and reviewed   Hyperlipidemia, mixed Encouraged heart healthy diet, increase exercise, avoid trans fats, consider a krill oil cap daily

## 2014-05-06 ENCOUNTER — Encounter: Payer: Self-pay | Admitting: Family Medicine

## 2014-05-06 DIAGNOSIS — G43809 Other migraine, not intractable, without status migrainosus: Secondary | ICD-10-CM | POA: Insufficient documentation

## 2014-05-06 DIAGNOSIS — Z780 Asymptomatic menopausal state: Secondary | ICD-10-CM | POA: Insufficient documentation

## 2014-05-06 DIAGNOSIS — Z1239 Encounter for other screening for malignant neoplasm of breast: Secondary | ICD-10-CM | POA: Insufficient documentation

## 2014-05-06 DIAGNOSIS — R1013 Epigastric pain: Secondary | ICD-10-CM | POA: Insufficient documentation

## 2014-05-06 NOTE — Assessment & Plan Note (Signed)
Is doing fairly well but some low mood is noted. Will increase Effexor to 75 mg daily

## 2014-05-06 NOTE — Assessment & Plan Note (Signed)
Encouraged increased hydration, 64 ounces of clear fluids daily. Minimize alcohol and caffeine. Eat small frequent meals with lean proteins and complex carbs. Avoid high and low blood sugars. Get adequate sleep, 7-8 hours a night. Needs exercise daily preferably in the morning.  

## 2014-05-06 NOTE — Assessment & Plan Note (Signed)
Encouraged DASH diet, decrease po intake and increase exercise as tolerated. Needs 7-8 hours of sleep nightly. Avoid trans fats, eat small, frequent meals every 4-5 hours with lean proteins, complex carbs and healthy fats. Minimize simple carbs, GMO foods. 

## 2014-05-06 NOTE — Assessment & Plan Note (Signed)
On Levothyroxine, continue to monitor 

## 2014-05-06 NOTE — Assessment & Plan Note (Signed)
Avoid offending foods, start probiotics. Do not eat large meals in late evening and consider raising head of bed. Carafate prn

## 2014-05-06 NOTE — Assessment & Plan Note (Addendum)
dexa scan ordered today. Has decided to switch her GYN here, has a very distant history of abnormal pap requiring LEEP, no gyn complaints. Will proceed at next visit. Patient encouraged to maintain heart healthy diet, regular exercise, adequate sleep. Consider daily probiotics. Take medications as prescribed. Labs ordered and reviewed

## 2014-05-06 NOTE — Assessment & Plan Note (Signed)
Encouraged heart healthy diet, increase exercise, avoid trans fats, consider a krill oil cap daily 

## 2014-05-06 NOTE — Assessment & Plan Note (Signed)
Well controlled, no changes to meds. Encouraged heart healthy diet such as the DASH diet and exercise as tolerated.  °

## 2014-05-06 NOTE — Assessment & Plan Note (Signed)
Has had a recent flare so her allergist at [facility], Dr Fuller Canada started her on cyclosporine 75 mg po bid with improvement

## 2014-05-24 ENCOUNTER — Encounter: Payer: Self-pay | Admitting: Family Medicine

## 2014-06-02 ENCOUNTER — Encounter: Payer: Self-pay | Admitting: Family Medicine

## 2014-06-04 ENCOUNTER — Telehealth: Payer: Self-pay | Admitting: Family Medicine

## 2014-06-04 NOTE — Telephone Encounter (Signed)
RX FOR HYDROCODONE 11-23-2013 NOT PICKED UP SHREDDED

## 2014-07-09 ENCOUNTER — Telehealth: Payer: Self-pay | Admitting: Family Medicine

## 2014-07-09 DIAGNOSIS — M5441 Lumbago with sciatica, right side: Secondary | ICD-10-CM

## 2014-07-09 MED ORDER — HYDROCODONE-ACETAMINOPHEN 10-325 MG PO TABS: 1.0000 | ORAL_TABLET | Freq: Four times a day (QID) | ORAL | Status: DC | PRN

## 2014-07-09 NOTE — Telephone Encounter (Signed)
Requesting:  HYDROCODONE Contract  SIGNED 10/27/13 UDS  DONE LOW RISK Last OV  04/30/14 Last Refill    #70 WITH 0 REFILLS ON 04/30/14  Please Advise

## 2014-07-09 NOTE — Telephone Encounter (Signed)
Caller name: Ladajah Gowdy Relationship to patient: self Can be reached: [phone] [facility]  Reason for call: Pt needing refill on hydrocodone. Takes up to 3/day. She is out of medication

## 2014-07-09 NOTE — Telephone Encounter (Signed)
Called the patient (husband) informed hardcopy for Hydrocodone is ready for pickup at the front desk.

## 2014-07-09 NOTE — Telephone Encounter (Signed)
OK to refill Hydrocodone same strength, same sig, same number

## 2014-07-09 NOTE — Telephone Encounter (Signed)
Printed and on counter for signature. 

## 2014-08-19 ENCOUNTER — Other Ambulatory Visit: Payer: Self-pay | Admitting: Family Medicine

## 2014-08-20 ENCOUNTER — Telehealth: Payer: Self-pay | Admitting: Family Medicine

## 2014-08-20 DIAGNOSIS — M5441 Lumbago with sciatica, right side: Secondary | ICD-10-CM

## 2014-08-20 MED ORDER — HYDROCODONE-ACETAMINOPHEN 10-325 MG PO TABS: 1.0000 | ORAL_TABLET | Freq: Four times a day (QID) | ORAL | Status: DC | PRN

## 2014-08-20 NOTE — Telephone Encounter (Signed)
Prescription has been printed, signed and at the front desk for pickup. Called the patient had to leave a detailed message hardcopy is ready for pickup.  (called her at 2:07pm Friday afternoon 08/20/14)

## 2014-08-20 NOTE — Telephone Encounter (Signed)
Relation to BO:ERQS  Call back number:[phone]   Reason for call:  Patient requesting a refill HYDROcodone-acetaminophen (NORCO) 10-325 MG per tablet. Would like to pick up today

## 2014-08-20 NOTE — Telephone Encounter (Signed)
Ok to refill 

## 2014-08-20 NOTE — Telephone Encounter (Signed)
Requesting: Hydrocodone Contract signed on 10/15 UDS  Done Low Risk Last OV  04/30/14 Last Refill   #70 with 0 refills 07/09/14  Please Advise

## 2014-09-03 ENCOUNTER — Telehealth: Payer: Self-pay | Admitting: Family Medicine

## 2014-09-03 NOTE — Telephone Encounter (Signed)
Called the patient she is having cramps/muscle spasms from feet up to her legs (knees and behind knees), states her leg knots up.  At work while sitting having spasms/cramps in the backs of her leg (knee down).

## 2014-09-03 NOTE — Telephone Encounter (Signed)
Stay well hydrated and add-on a Propel water twice daily to help replenish electrolytes. She needs an appointment for further assessment.

## 2014-09-03 NOTE — Telephone Encounter (Signed)
Caller name: Jenea Montante  Relationship to patient: Self  Can be reached:[phone] Pharmacy:  Reason for call: pt has been having cramps for the last month. Pt says that she is unable to sleep at night because they are getting worse. Please call back to advise.

## 2014-09-03 NOTE — Telephone Encounter (Signed)
Called the patient informed of instructions.  The patient did verbalize understanding and ageed to do.  She will give it the weekend and if no better call for appt. Next  Week.

## 2014-09-13 ENCOUNTER — Other Ambulatory Visit: Payer: Self-pay | Admitting: Family Medicine

## 2014-09-13 DIAGNOSIS — M5441 Lumbago with sciatica, right side: Secondary | ICD-10-CM

## 2014-09-13 DIAGNOSIS — F988 Other specified behavioral and emotional disorders with onset usually occurring in childhood and adolescence: Secondary | ICD-10-CM

## 2014-09-13 MED ORDER — AMPHETAMINE-DEXTROAMPHETAMINE 20 MG PO TABS: 20.0000 mg | ORAL_TABLET | Freq: Two times a day (BID) | ORAL | Status: DC

## 2014-09-13 MED ORDER — HYDROCODONE-ACETAMINOPHEN 10-325 MG PO TABS: 1.0000 | ORAL_TABLET | Freq: Four times a day (QID) | ORAL | Status: DC | PRN

## 2014-09-13 NOTE — Telephone Encounter (Signed)
Requesting: hYDROCODONE Contract 10/27/13 UDS  Low Risk Last OV 04/30/14 Last Refill  #70 with 0 refills on 8/.5/16  Please Advise  Requesting: ADDERALL Contract 10/27/13 UDS  Low Risk Last OV  04/30/14 Last Refill   #60 with 0 refill on 03/16/14  Please Advise

## 2014-09-13 NOTE — Telephone Encounter (Signed)
Called the patient informed to pickup hardcopy of both Adderall and Hydrocodone at the front desk.

## 2014-09-13 NOTE — Telephone Encounter (Signed)
Relationship to patient: self Can be reached: [phone]  Reason for call: Pt needing refill on hydrocodone. Has 2 left. Taking 4/day. Pt needing refill on adderall. Has thru Friday. Taking 1-2/day.  Please call when RX is ready for pick up.

## 2014-09-23 ENCOUNTER — Encounter: Payer: Self-pay | Admitting: Family Medicine

## 2014-09-28 ENCOUNTER — Encounter: Payer: Self-pay | Admitting: Family Medicine

## 2014-09-28 ENCOUNTER — Ambulatory Visit (INDEPENDENT_AMBULATORY_CARE_PROVIDER_SITE_OTHER): Payer: Managed Care, Other (non HMO) | Admitting: Family Medicine

## 2014-09-28 VITALS — BP 140/79 | HR 78 | Temp 98.3°F | Ht 66.5 in | Wt 199.6 lb

## 2014-09-28 DIAGNOSIS — E039 Hypothyroidism, unspecified: Secondary | ICD-10-CM | POA: Diagnosis not present

## 2014-09-28 DIAGNOSIS — K219 Gastro-esophageal reflux disease without esophagitis: Secondary | ICD-10-CM

## 2014-09-28 DIAGNOSIS — E782 Mixed hyperlipidemia: Secondary | ICD-10-CM | POA: Diagnosis not present

## 2014-09-28 DIAGNOSIS — Z0000 Encounter for general adult medical examination without abnormal findings: Secondary | ICD-10-CM | POA: Diagnosis not present

## 2014-09-28 DIAGNOSIS — F988 Other specified behavioral and emotional disorders with onset usually occurring in childhood and adolescence: Secondary | ICD-10-CM

## 2014-09-28 DIAGNOSIS — I10 Essential (primary) hypertension: Secondary | ICD-10-CM

## 2014-09-28 DIAGNOSIS — F909 Attention-deficit hyperactivity disorder, unspecified type: Secondary | ICD-10-CM

## 2014-09-28 DIAGNOSIS — K629 Disease of anus and rectum, unspecified: Secondary | ICD-10-CM

## 2014-09-28 MED ORDER — HYDROCORTISONE ACETATE 25 MG RE SUPP: 25.0000 mg | Freq: Every day | RECTAL | Status: DC

## 2014-09-28 NOTE — Patient Instructions (Signed)

## 2014-09-28 NOTE — Progress Notes (Signed)
Pre visit review using our clinic review tool, if applicable. No additional management support is needed unless otherwise documented below in the visit note. 

## 2014-09-29 ENCOUNTER — Ambulatory Visit (INDEPENDENT_AMBULATORY_CARE_PROVIDER_SITE_OTHER): Payer: Managed Care, Other (non HMO) | Admitting: Physician Assistant

## 2014-09-29 ENCOUNTER — Encounter: Payer: Self-pay | Admitting: Physician Assistant

## 2014-09-29 VITALS — BP 122/80 | HR 80 | Resp 16 | Ht 66.5 in | Wt 198.4 lb

## 2014-09-29 DIAGNOSIS — K648 Other hemorrhoids: Secondary | ICD-10-CM

## 2014-09-29 DIAGNOSIS — K589 Irritable bowel syndrome without diarrhea: Secondary | ICD-10-CM | POA: Diagnosis not present

## 2014-09-29 DIAGNOSIS — K644 Residual hemorrhoidal skin tags: Secondary | ICD-10-CM

## 2014-09-29 LAB — COMPREHENSIVE METABOLIC PANEL
ALT: 19 U/L (ref 0–35)
AST: 18 U/L (ref 0–37)
Albumin: 4.6 g/dL (ref 3.5–5.2)
Alkaline Phosphatase: 124 U/L — ABNORMAL HIGH (ref 39–117)
BUN: 16 mg/dL (ref 6–23)
CALCIUM: 9.5 mg/dL (ref 8.4–10.5)
CO2: 27 meq/L (ref 19–32)
Chloride: 101 mEq/L (ref 96–112)
Creatinine, Ser: 0.94 mg/dL (ref 0.40–1.20)
GFR: 66.01 mL/min (ref >60.00)
Glucose, Bld: 100 mg/dL — ABNORMAL HIGH (ref 70–99)
Potassium: 4 mEq/L (ref 3.5–5.1)
SODIUM: 138 meq/L (ref 135–145)
Total Bilirubin: 0.5 mg/dL (ref 0.2–1.2)
Total Protein: 7.7 g/dL (ref 6.0–8.3)

## 2014-09-29 LAB — CBC
HEMATOCRIT: 38.6 % (ref 36.0–46.0)
Hemoglobin: 12.9 g/dL (ref 12.0–15.0)
MCHC: 33.3 g/dL (ref 30.0–36.0)
MCV: 87 fl (ref 78.0–100.0)
Platelets: 252 10*3/uL (ref 150.0–400.0)
RBC: 4.44 Mil/uL (ref 3.87–5.11)
RDW: 13.1 % (ref 11.5–15.5)
WBC: 7.5 10*3/uL (ref 4.0–10.5)

## 2014-09-29 LAB — MAGNESIUM: Magnesium: 2 mg/dL (ref 1.5–2.5)

## 2014-09-29 LAB — SEDIMENTATION RATE: SED RATE: 26 mm/h — AB (ref 0–22)

## 2014-09-29 LAB — TSH: TSH: 0.83 u[IU]/mL (ref 0.35–4.50)

## 2014-09-29 MED ORDER — HYDROCORTISONE ACE-PRAMOXINE 2.5-1 % RE CREA: 1.0000 "application " | TOPICAL_CREAM | Freq: Two times a day (BID) | RECTAL | Status: DC

## 2014-09-29 MED ORDER — RIFAXIMIN 550 MG PO TABS: 550.0000 mg | ORAL_TABLET | Freq: Three times a day (TID) | ORAL | Status: DC

## 2014-09-29 NOTE — Progress Notes (Signed)
Patient ID: Joan Robinson, female   DOB: 1960/06/01, 54 y.o.   MRN: [id number]     History of Present Illness: Joan Robinson  is a pleasant 54 year old female known to Dr. Marina Goodell with a history of IBS, peptic ulcer disease, recurrent C. difficile diarrhea, adenomatous colon polyps, and GERD. Her last colonoscopy was in March 2014 (see no polyps). Follow up in 5 years was recommended (index exam 2003 with multiple and large adenomatous polyps). She is here today because she has a sore area on her rectum. She has a history of internal hemorrhoids. For the past week she has felt something sore near her rectum. She reports that sometimes she has bloody spotting on the toilet tissue. She has a sensation of incomplete evacuation. She has had several episodes over the past few months where she will feel wet and wipe and have some blood on the toilet tissue. She has no pain while passing her bowels. She has a history of diarrhea predominant IBS and states she has 1 or 2 loose bowel movements every morning. She feels excessively bloated and gurgly and says she has been passing more gas than normal. She has no fever, chills, or night sweats. She has no upper GI symptoms. She has no abdominal pain.  Past Medical History  Diagnosis Date  . Allergic rhinitis   . Adenomatous colon polyp   . Diverticulosis   . Colitis, Clostridium difficile 5/11,6/11  . Hypothyroidism   . Urinary incontinence   . PUD (peptic ulcer disease)   . Tobacco abuse   . Hypertension   . Hyperlipidemia   . Colon polyp     benign  . Anemia 10/10/2011  . Preventative health care 10/10/2011  . Sinusitis acute 10/10/2011  . Chicken pox as a child    X 2  . Mumps as a child  . Lower back pain   . 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 Staph aureus infection 12/18/2011    Recurrent lesions in nares  . Perimenopausal 01/16/2012  . Anxiety and depression 01/17/2007    Qualifier: Diagnosis of  By: Everardo All MD, Cleophas Dunker   . 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 Right knee pain 05/10/2012  . BCC (basal cell carcinoma of skin) 06/01/2012    Leg Follows with Dr Margo Aye  . Headache(784.0) 08/17/2012  . Bipolar disorder 01/17/2007    Qualifier: Diagnosis of  By: Everardo All MD, Cleophas Dunker   . 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 Arthritis 07/24/2013    Likely inflammatory and following with Dr Maryln Gottron of [facility]  rheumatology  . Diverticulosis   . Hypothyroidism 08/24/2006    Qualifier: Diagnosis of  By: Everardo All MD, Sean A    . 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 Obesity 11/01/2013    Past Surgical History  Procedure Laterality Date  . Tubal ligation  1995  . Uterine suspension      mesh  . Colonoscopy    . [place]ated spect wall motion stress cardiolite  11/05/2001  . Polypectomy    . Abdominal hysterectomy    . Abdominal hysterectomy  2011    complete  . Dilation and curettage of uterus  1985  . Vitrectomy Bilateral 03/03/14  . Eye surgery  03/03/14    Surgery on right eye for epiretinal membrane (vitreous peel)    Family History  Problem Relation Age of Onset  . Heart disease Mother     stents  . Stroke Mother   . Hyperlipidemia Mother   .  Hypertension Mother   . Other Mother     blood disorder- mgus  . Colon polyps Father   . Other Father     aorta disection  . Aneurysm Father   . Colon cancer Paternal Uncle   . Irritable bowel syndrome Daughter   . Other Daughter     gastritis  . Mental illness Daughter     bipolar and mood disorder  . Hypertension Sister     ?  Marland Kitchen Mental illness Son     bipolar  . Arthritis Sister   . Other Sister     thyroid  . Other Sister     thyroid   Social History  Substance Use Topics  . Smoking status: Former Smoker -- 1.00 packs/day for 30 years    Types: Cigarettes    Quit date: 05/15/2009  . Smokeless tobacco: Never Used  . Alcohol Use: No   Current Outpatient Prescriptions  Medication Sig Dispense Refill  . ALPRAZolam (XANAX) 0.25 MG tablet Take 1 tablet (0.25 mg total) by mouth 3 (three)  times daily as needed for sleep or anxiety. 30 tablet 3  . amphetamine-dextroamphetamine (ADDERALL) 20 MG tablet Take 1 tablet (20 mg total) by mouth 2 (two) times daily. January 2016 rx 60 tablet 0  . Cholecalciferol (VITAMIN D3) 2000 UNITS TABS Take by mouth.    . cycloSPORINE (SANDIMMUNE) 25 MG capsule Take 75 mg by mouth 2 (two) times daily.    Marland Kitchen HYDROcodone-acetaminophen (NORCO) 10-325 MG per tablet Take 1 tablet by mouth every 6 (six) hours as needed. 70 tablet 0  . hydrOXYzine (ATARAX/VISTARIL) 25 MG tablet Take 25 mg by mouth at bedtime as needed.    Marland Kitchen levothyroxine (SYNTHROID, LEVOTHROID) 100 MCG tablet Take 1 tablet (100 mcg total) by mouth daily before breakfast. 30 tablet 3  . losartan (COZAAR) 25 MG tablet Take 1 tablet (25 mg total) by mouth daily. 30 tablet 5  . metoprolol tartrate (LOPRESSOR) 25 MG tablet Take 0.5 tablets (12.5 mg total) by mouth 2 (two) times daily. 90 tablet 1  . nitroGLYCERIN (NITROSTAT) 0.4 MG SL tablet Place 0.4 mg under the tongue every 5 (five) minutes as needed.      . ondansetron (ZOFRAN) 4 MG tablet Take 1 tablet (4 mg total) by mouth every 4 (four) hours as needed for nausea or vomiting. 30 tablet 1  . ranitidine (ZANTAC) 150 MG tablet Take 1 tablet (150 mg total) by mouth 2 (two) times daily. 180 tablet 1  . sucralfate (CARAFATE) 1 G tablet Take 1 tablet (1 g total) by mouth 4 (four) times daily -  with meals and at bedtime. 120 tablet 3  . SUMAtriptan (IMITREX) 50 MG tablet Take 1 tablet (50 mg total) by mouth once. May repeat in 2 hours if headache persists or recurs. 10 tablet 0  . venlafaxine XR (EFFEXOR XR) 75 MG 24 hr capsule Take 1 capsule (75 mg total) by mouth daily with breakfast. D/C previous 37.5 mg dose of med 30 capsule 5  . vitamin A 8000 UNIT capsule Take 8,000 Units by mouth daily.    . hydrocortisone (ANUSOL-HC) 25 MG suppository Place 1 suppository (25 mg total) rectally at bedtime. (Patient not taking: Reported on 09/29/2014) 12  suppository 0  . hydrocortisone-pramoxine (ANALPRAM HC) 2.5-1 % rectal cream Place 1 application rectally 2 (two) times daily. For 10 days 30 g 1  . rifaximin (XIFAXAN) 550 MG TABS tablet Take 1 tablet (550 mg total) by mouth 3 (three)  times daily. For 14 days 42 tablet 0   No current facility-administered medications for this visit.   Allergies  Allergen Reactions  . Keflex [Cephalexin] Diarrhea  . Aspirin     REACTION: difficulty breathing  . Dicyclomine Hcl     REACTION: mouth ulcers  . Metronidazole     REACTION: hives, mouth ulcers  . Moxifloxacin     REACTION: increased heart rate, nausea  . Nsaids     REACTION: difficulty breathing  . Phenergan [Promethazine Hcl]     "knocks" pt out for about 3 days  . Quetiapine     Somnolence. Slept for 36 hours straight.  . Sulfamethoxazole-Trimethoprim     REACTION: increased heart rate, nausea  . Cymbalta [Duloxetine Hcl] Rash     Review of Systems: Gen: Denies any fever, chills, sweats, anorexia, fatigue, weakness, malaise, weight loss, and sleep disorder CV: Denies chest pain, angina, palpitations, syncope, orthopnea, PND, peripheral edema, and claudication. Resp: Denies dyspnea at rest, dyspnea with exercise, cough, sputum, wheezing, coughing up blood, and pleurisy. GI: Denies vomiting blood, jaundice, and fecal incontinence.   Denies dysphagia or odynophagia. GU : Denies urinary burning, blood in urine, urinary frequency, urinary hesitancy, nocturnal urination, and urinary incontinence. MS: Denies joint pain, limitation of movement, and swelling, stiffness, low back pain, extremity pain. Denies muscle weakness, cramps, atrophy.  Derm: Denies rash, itching, dry skin, hives, moles, warts, or unhealing ulcers.  Psych: Denies depression, anxiety, memory loss, suicidal ideation, hallucinations, paranoia, and confusion. Heme: Denies bruising, bleeding, and enlarged lymph nodes. Neuro:  Denies any headaches, dizziness,  paresthesia Endo:  Denies any problems with DM, thyroid, adrenal  LAB RESULTS:  Recent Labs  09/28/14 1659  WBC 7.5  HGB 12.9  HCT 38.6  PLT 252.0   BMET  Recent Labs  09/28/14 1659  NA 138  K 4.0  CL 101  CO2 27  GLUCOSE 100*  BUN 16  CREATININE 0.94  CALCIUM 9.5   LFT  Recent Labs  09/28/14 1659  PROT 7.7  ALBUMIN 4.6  AST 18  ALT 19  ALKPHOS 124*  BILITOT 0.5     Physical Exam: BP 122/80 mmHg  Pulse 80  Resp 16  Ht 5' 6.5" (1.689 m)  Wt 198 lb 6.4 oz (89.994 kg)  BMI 31.55 kg/m2 General: Pleasant, well developed , female in no acute distress Head: Normocephalic and atraumatic Eyes:  sclerae anicteric, conjunctiva pink  Ears: Normal auditory acuity Lungs: Clear throughout to auscultation Heart: Regular rate and rhythm Abdomen: Soft, non distended, non-tender. No masses, no hepatomegaly. Normal bowel sounds Rectal: Moderate sized nonthrombosed but tender external hemorrhoid noted. Brown stool, Hemoccult-negative. Anoscopy with right sided internal hemorrhoids. Musculoskeletal: Symmetrical with no gross deformities  Extremities: No edema  Neurological: Alert oriented x 4, grossly nonfocal Psychological:  Alert and cooperative. Normal mood and affect  Assessment and Recommendations: #1. Internal and external hemorrhoids. Patient has been provided a hemorrhoid handout. She will try Analpram-HC 2.5% cream to apply externally to the external hemorrhoid and internally to the internal hemorrhoids twice daily for 10 days. She will use Tucks wipes. She has been provided information about hemorrhoidal banding as well. It was explained to her that should she experience more frequent episodes of bleeding or worsening sensation of incomplete evacuation she may at some time be a candidate for hemorrhoidal banding. She will call us if she does not have relief with the panel pram cream.  #2. IBS. Patient has a long-standing history of diarrhea predominant  IBS. She has  been experiencing a lot of bloating and gas lately. We will give her a trial of Xifaxan 550 mg 3 times daily for 14 days.  She will follow up on an as-needed basis.        Dagoberto Nealy, Moise Boring 09/29/2014,

## 2014-09-29 NOTE — Patient Instructions (Signed)
We have sent the following medications to your pharmacy for you to pick up at your convenience: Analpram HC Cream  We have sent your prescription for Xifaxax to [facility]. They will contact you regarding the medication.  Please purchase the following medications over the counter and take as directed: Tucks wipes  Hemorrhoid Banding Hemorrhoids are veins in the anus and lower rectum that become enlarged. The most common symptoms are rectal bleeding, itching, and sometimes pain. Hemorrhoids might come out with straining or having a bowel movement, and they can sometimes be pushed back in. There are internal and external hemorrhoids. Only internal hemorrhoids can be treated with banding. In this procedure, a rubber band is placed near the hemorrhoid tissue, cutting off the blood supply. This procedure prevents the hemorrhoids from slipping down. LET YOUR CAREGIVER KNOW ABOUT: All medicines you are taking, especially blood thinners such as aspirin and coumadin.  RISKS AND COMPLICATIONS This is not a painful procedure, but if you do have intense pain immediately let your surgeon know because the band may need to be removed. You may have some mild pain or discomfort in the first 2 days or so after treatment. Sometimes there may be delayed bleeding in the first week after treatment.  BEFORE THE PROCEDURE  There is no special preparation needed before banding. Your surgeon may have you do an enema prior to the procedure. You will go home the same day.  HOME CARE INSTRUCTIONS  1. Your surgeon might instruct you to do sitz baths as needed if you have discomfort or after a bowel movement. 2. You may be instructed to use fiber supplements. SEEK MEDICAL CARE IF: 1. You have an increase in pain. 2. Your pain does not get better. SEEK IMMEDIATE MEDICAL CARE IF: 1. You have intense pain. 2. Fever greater than 100.5 F (38.1 C). 3. Bleeding that does not stop, or pus from the anus. Document  Released: 10/29/2008 Document Revised: 03/26/2011 Document Reviewed: 10/29/2008 [facility] Patient Information 2015 [place], [place]. This information is not intended to replace advice given to you by your health care provider. Make sure you discuss any questions you have with your health care provider.  About Hemorrhoids  Hemorrhoids are swollen veins in the lower rectum and anus.  Also called piles, hemorrhoids are a common problem.  Hemorrhoids may be internal (inside the rectum) or external (around the anus).  Internal Hemorrhoids  Internal hemorrhoids are often painless, but they rarely cause bleeding.  The internal veins may stretch and fall down (prolapse) through the anus to the outside of the body.  The veins may then become irritated and painful.  External Hemorrhoids  External hemorrhoids can be easily seen or felt around the anal opening.  They are under the skin around the anus.  When the swollen veins are scratched or broken by straining, rubbing or wiping they sometimes bleed.  How Hemorrhoids Occur  Veins in the rectum and around the anus tend to swell under pressure.  Hemorrhoids can result from increased pressure in the veins of your anus or rectum.  Some sources of pressure are:   Straining to have a bowel movement because of constipation  Waiting too long to have a bowel movement  Coughing and sneezing often  Sitting for extended periods of time, including on the toilet  Diarrhea  Obesity  Trauma or injury to the anus  Some liver diseases  Stress  Family history of hemorrhoids  Pregnancy  Pregnant women should try to avoid becoming  constipated, because they are more likely to have hemorrhoids during pregnancy.  In the last trimester of pregnancy, the enlarged uterus may press on blood vessels and causes hemorrhoids.  In addition, the strain of childbirth sometimes causes hemorrhoids after the birth.  Symptoms of Hemorrhoids  Some symptoms of hemorrhoids  include:  Swelling and/or a tender lump around the anus  Itching, mild burning and bleeding around the anus  Painful bowel movements with or without constipation  Bright red blood covering the stool, on toilet paper or in the toilet bowel.   Symptoms usually go away within a few days.  Always talk to your doctor about any bleeding to make sure it is not from some other causes.  Diagnosing and Treating Hemorrhoids  Diagnosis is made by an examination by your healthcare provider.  Special test can be performed by your doctor.    Most cases of hemorrhoids can be treated with:  High-fiber diet: Eat more high-fiber foods, which help prevent constipation.  Ask for more detailed fiber information on types and sources of fiber from your healthcare provider.  Fluids: Drink plenty of water.  This helps soften bowel movements so they are easier to pass.  Sitz baths and cold packs: Sitting in lukewarm water two or three times a day for 15 minutes cleases the anal area and may relieve discomfort.  If the water is too hot, swelling around the anus will get worse.  Placing a cloth-covered ice pack on the anus for ten minutes four times a day can also help reduce selling.  Gently pushing a prolapsed hemorrhoid back inside after the bath or ice pack can be helpful.  Medications: For mild discomfort, your healthcare provider may suggest over-the-counter pain medication or prescribe a cream or ointment for topical use.  The cream may contain witch hazel, zinc oxide or petroleum jelly.  Medicated suppositories are also a treatment option.  Always consult your doctor before applying medications or creams.  Procedures and surgeries: There are also a number of procedures and surgeries to shrink or remove hemorrhoids in more serious cases.  Talk to your physician about these options.  You can often prevent hemorrhoids or keep them from becoming worse by maintaining a healthy lifestyle.  Eat a fiber-rich diet of  fruits, vegetables and whole grains.  Also, drink plenty of water and exercise regularly.   2007, [employer] Doc.30   Please return as needed

## 2014-10-01 ENCOUNTER — Telehealth: Payer: Self-pay | Admitting: Physician Assistant

## 2014-10-04 ENCOUNTER — Other Ambulatory Visit: Payer: Self-pay | Admitting: Family Medicine

## 2014-10-04 MED ORDER — VENLAFAXINE HCL ER 75 MG PO CP24: 75.0000 mg | ORAL_CAPSULE | Freq: Every day | ORAL | Status: DC

## 2014-10-05 NOTE — Progress Notes (Signed)
Agree with initial assessment and plans as outlined 

## 2014-10-07 ENCOUNTER — Ambulatory Visit (INDEPENDENT_AMBULATORY_CARE_PROVIDER_SITE_OTHER): Payer: Managed Care, Other (non HMO)

## 2014-10-07 ENCOUNTER — Ambulatory Visit (INDEPENDENT_AMBULATORY_CARE_PROVIDER_SITE_OTHER): Payer: Managed Care, Other (non HMO) | Admitting: Family Medicine

## 2014-10-07 ENCOUNTER — Other Ambulatory Visit: Payer: Self-pay | Admitting: Family Medicine

## 2014-10-07 VITALS — BP 128/80 | HR 59 | Temp 98.4°F | Resp 18 | Ht 67.0 in | Wt 200.0 lb

## 2014-10-07 DIAGNOSIS — R05 Cough: Secondary | ICD-10-CM | POA: Diagnosis not present

## 2014-10-07 DIAGNOSIS — J069 Acute upper respiratory infection, unspecified: Secondary | ICD-10-CM

## 2014-10-07 DIAGNOSIS — J22 Unspecified acute lower respiratory infection: Secondary | ICD-10-CM

## 2014-10-07 DIAGNOSIS — J988 Other specified respiratory disorders: Secondary | ICD-10-CM | POA: Diagnosis not present

## 2014-10-07 DIAGNOSIS — R059 Cough, unspecified: Secondary | ICD-10-CM

## 2014-10-07 LAB — POCT CBC
Granulocyte percent: 64.3 %G (ref 37–80)
HEMATOCRIT: 35.6 % — AB (ref 37.7–47.9)
HEMOGLOBIN: 11.2 g/dL — AB (ref 12.2–16.2)
LYMPH, POC: 1.6 (ref 0.6–3.4)
MCH: 27.2 pg (ref 27–31.2)
MCHC: 31.4 g/dL — AB (ref 31.8–35.4)
MCV: 86.4 fL (ref 80–97)
MID (CBC): 0.4 (ref 0–0.9)
MPV: 7.6 fL (ref 0–99.8)
POC GRANULOCYTE: 3.6 (ref 2–6.9)
POC LYMPH PERCENT: 28 %L (ref 10–50)
POC MID %: 7.7 % (ref 0–12)
Platelet Count, POC: 218 10*3/uL (ref 142–424)
RBC: 4.12 M/uL (ref 4.04–5.48)
RDW, POC: 13.7 %
WBC: 5.6 10*3/uL (ref 4.6–10.2)

## 2014-10-07 NOTE — Patient Instructions (Addendum)
Saline nasal spray atleast 4 times per day, over the counter mucinex or mucinex DM, drink plenty of fluids, and rest as needed.  Return to the clinic or go to the nearest emergency room if any of your symptoms worsen or new symptoms occur.  Upper Respiratory Infection, Adult An upper respiratory infection (URI) is also sometimes known as the common cold. The upper respiratory tract includes the nose, sinuses, throat, trachea, and bronchi. Bronchi are the airways leading to the lungs. Most people improve within 1 week, but symptoms can last up to 2 weeks. A residual cough may last even longer.  CAUSES Many different viruses can infect the tissues lining the upper respiratory tract. The tissues become irritated and inflamed and often become very moist. Mucus production is also common. A cold is contagious. You can easily spread the virus to others by oral contact. This includes kissing, sharing a glass, coughing, or sneezing. Touching your mouth or nose and then touching a surface, which is then touched by another person, can also spread the virus. SYMPTOMS  Symptoms typically develop 1 to 3 days after you come in contact with a cold virus. Symptoms vary from person to person. They may include:  Runny nose.  Sneezing.  Nasal congestion.  Sinus irritation.  Sore throat.  Loss of voice (laryngitis).  Cough.  Fatigue.  Muscle aches.  Loss of appetite.  Headache.  Low-grade fever. DIAGNOSIS  You might diagnose your own cold based on familiar symptoms, since most people get a cold 2 to 3 times a year. Your caregiver can confirm this based on your exam. Most importantly, your caregiver can check that your symptoms are not due to another disease such as strep throat, sinusitis, pneumonia, asthma, or epiglottitis. Blood tests, throat tests, and X-rays are not necessary to diagnose a common cold, but they may sometimes be helpful in excluding other more serious diseases. Your caregiver will  decide if any further tests are required. RISKS AND COMPLICATIONS  You may be at risk for a more severe case of the common cold if you smoke cigarettes, have chronic heart disease (such as heart failure) or lung disease (such as asthma), or if you have a weakened immune system. The very young and very old are also at risk for more serious infections. Bacterial sinusitis, middle ear infections, and bacterial pneumonia can complicate the common cold. The common cold can worsen asthma and chronic obstructive pulmonary disease (COPD). Sometimes, these complications can require emergency medical care and may be life-threatening. PREVENTION  The best way to protect against getting a cold is to practice good hygiene. Avoid oral or hand contact with people with cold symptoms. Wash your hands often if contact occurs. There is no clear evidence that vitamin C, vitamin E, echinacea, or exercise reduces the chance of developing a cold. However, it is always recommended to get plenty of rest and practice good nutrition. TREATMENT  Treatment is directed at relieving symptoms. There is no cure. Antibiotics are not effective, because the infection is caused by a virus, not by bacteria. Treatment may include:  Increased fluid intake. Sports drinks offer valuable electrolytes, sugars, and fluids.  Breathing heated mist or steam (vaporizer or shower).  Eating chicken soup or other clear broths, and maintaining good nutrition.  Getting plenty of rest.  Using gargles or lozenges for comfort.  Controlling fevers with ibuprofen or acetaminophen as directed by your caregiver.  Increasing usage of your inhaler if you have asthma. Zinc gel and zinc lozenges,  taken in the first 24 hours of the common cold, can shorten the duration and lessen the severity of symptoms. Pain medicines may help with fever, muscle aches, and throat pain. A variety of non-prescription medicines are available to treat congestion and runny nose.  Your caregiver can make recommendations and may suggest nasal or lung inhalers for other symptoms.  HOME CARE INSTRUCTIONS   Only take over-the-counter or prescription medicines for pain, discomfort, or fever as directed by your caregiver.  Use a warm mist humidifier or inhale steam from a shower to increase air moisture. This may keep secretions moist and make it easier to breathe.  Drink enough water and fluids to keep your urine clear or pale yellow.  Rest as needed.  Return to work when your temperature has returned to normal or as your caregiver advises. You may need to stay home longer to avoid infecting others. You can also use a face mask and careful hand washing to prevent spread of the virus. SEEK MEDICAL CARE IF:   After the first few days, you feel you are getting worse rather than better.  You need your caregiver's advice about medicines to control symptoms.  You develop chills, worsening shortness of breath, or brown or red sputum. These may be signs of pneumonia.  You develop yellow or brown nasal discharge or pain in the face, especially when you bend forward. These may be signs of sinusitis.  You develop a fever, swollen neck glands, pain with swallowing, or white areas in the back of your throat. These may be signs of strep throat. SEEK IMMEDIATE MEDICAL CARE IF:   You have a fever.  You develop severe or persistent headache, ear pain, sinus pain, or chest pain.  You develop wheezing, a prolonged cough, cough up blood, or have a change in your usual mucus (if you have chronic lung disease).  You develop sore muscles or a stiff neck. Document Released: 06/27/2000 Document Revised: 03/26/2011 Document Reviewed: 04/08/2013 [facility] Patient Information 2015 [place], [place]. This information is not intended to replace advice given to you by your health care provider. Make sure you discuss any questions you have with your health care provider.  Cough, Adult  A cough is  a reflex that helps clear your throat and airways. It can help heal the body or may be a reaction to an irritated airway. A cough may only last 2 or 3 weeks (acute) or may last more than 8 weeks (chronic).  CAUSES Acute cough:  Viral or bacterial infections. Chronic cough:  Infections.  Allergies.  Asthma.  Post-nasal drip.  Smoking.  Heartburn or acid reflux.  Some medicines.  Chronic lung problems (COPD).  Cancer. SYMPTOMS   Cough.  Fever.  Chest pain.  Increased breathing rate.  High-pitched whistling sound when breathing (wheezing).  Colored mucus that you cough up (sputum). TREATMENT   A bacterial cough may be treated with antibiotic medicine.  A viral cough must run its course and will not respond to antibiotics.  Your caregiver may recommend other treatments if you have a chronic cough. HOME CARE INSTRUCTIONS   Only take over-the-counter or prescription medicines for pain, discomfort, or fever as directed by your caregiver. Use cough suppressants only as directed by your caregiver.  Use a cold steam vaporizer or humidifier in your bedroom or home to help loosen secretions.  Sleep in a semi-upright position if your cough is worse at night.  Rest as needed.  Stop smoking if you smoke. SEEK IMMEDIATE  MEDICAL CARE IF:   You have pus in your sputum.  Your cough starts to worsen.  You cannot control your cough with suppressants and are losing sleep.  You begin coughing up blood.  You have difficulty breathing.  You develop pain which is getting worse or is uncontrolled with medicine.  You have a fever. MAKE SURE YOU:   Understand these instructions.  Will watch your condition.  Will get help right away if you are not doing well or get worse. Document Released: 06/30/2010 Document Revised: 03/26/2011 Document Reviewed: 06/30/2010 [facility] Patient Information 2015 [place], [place]. This information is not intended to replace advice given to  you by your health care provider. Make sure you discuss any questions you have with your health care provider.

## 2014-10-07 NOTE — Progress Notes (Signed)
Subjective:  This chart was scribed for Joan Post, MD by Broadus John, Medical Scribe. This patient was seen in Room 11 and the patient's care was started at 2:48 PM.   Patient ID: Joan Robinson, female    DOB: 1960/05/22, 54 y.o.   MRN: [id number]  Chief Complaint  Patient presents with  . Cough    low grade fever; chills x 4 days  . Sore Throat    HPI HPI Comments: Joan Robinson is a 54 y.o. female who presents to [facility] complaining of sore throat and cough, history of multiple medical problems, see problem list.   Pt report that they symptoms started about 4 days ago with sore throat and chills. She indicates that the symptoms developed of congestion of the head and the chest, in addition to cough and hot/cold spells. She reports multiple sick contact at work. Pt notes that she took tylenol and mucinex for the symptoms. Pt denies subjective fever. She notes a past medical history of PNA (last year).    Patient Active Problem List   Diagnosis Date Noted  . Other type of migraine without status migrainosus 05/06/2014  . Dyspepsia 05/06/2014  . Postmenopausal estrogen deficiency 05/06/2014  . Screening for breast cancer 05/06/2014  . 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 Obesity 11/01/2013  . Eustachian tube dysfunction 10/08/2013  . Otalgia of left ear 10/08/2013  . 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 Arthritis 07/24/2013  . Depression with anxiety 03/08/2013  . Paresthesia 12/22/2012  . Headache(784.0) 08/17/2012  . BCC (basal cell carcinoma of skin) 06/01/2012  . Left arm pain 05/10/2012  . Right knee pain 05/10/2012  . 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 ADD (attention deficit disorder) 04/13/2012  . Perimenopausal 01/16/2012  . Staph aureus infection 12/18/2011  . Anemia 10/10/2011  . Preventative health care 10/10/2011  . Urticaria, chronic 10/10/2011  . 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 Lower back pain   .  DIVERTICULITIS-COLON 07/25/2009  . CLOSTRIDIUM DIFFICILE COLITIS 06/22/2009  . Hyperlipidemia, mixed 06/09/2009  . Essential hypertension 06/09/2009  . HELICOBACTER PYLORI GASTRITIS, HX OF 09/06/2008  . GERD 07/27/2008  . PERSONAL HX COLONIC POLYPS 07/27/2008  . Bipolar disorder 01/17/2007  . Hypothyroidism 08/24/2006  . ALLERGIC RHINITIS 08/24/2006  . URINARY INCONTINENCE 08/24/2006   Past Medical History  Diagnosis Date  . Allergic rhinitis   . Adenomatous colon polyp   . Diverticulosis   . Colitis, Clostridium difficile 5/11,6/11  . Hypothyroidism   . Urinary incontinence   . PUD (peptic ulcer disease)   . Tobacco abuse   . Hypertension   . Hyperlipidemia   . Colon polyp     benign  . Anemia 10/10/2011  . Preventative health care 10/10/2011  . Sinusitis acute 10/10/2011  . Chicken pox as a child    X 2  . Mumps as a child  . Lower back pain   . 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 Staph aureus infection 12/18/2011    Recurrent lesions in nares  . Perimenopausal 01/16/2012  . Anxiety and depression 01/17/2007    Qualifier: Diagnosis of  By: Everardo All MD, Cleophas Dunker   . 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 Right knee pain 05/10/2012  . BCC (basal cell carcinoma of skin) 06/01/2012    Leg Follows with Dr Margo Aye  . Headache(784.0) 08/17/2012  . Bipolar disorder 01/17/2007    Qualifier: Diagnosis of  By: Everardo All MD, Cleophas Dunker   . 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  Arthritis 07/24/2013    Likely inflammatory and following with Dr Maryln Gottron of [facility]  rheumatology  . Diverticulosis   . Hypothyroidism 08/24/2006    Qualifier: Diagnosis of  By: Everardo All MD, Sean A    . 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 Obesity 11/01/2013   Past Surgical History  Procedure Laterality Date  . Tubal ligation  1995  . Uterine suspension      mesh  . Colonoscopy    . Gated spect wall motion stress cardiolite  11/05/2001  . Polypectomy    . Abdominal hysterectomy    . Abdominal hysterectomy  2011    complete    . Dilation and curettage of uterus  1985  . Vitrectomy Bilateral 03/03/14  . Eye surgery  03/03/14    Surgery on right eye for epiretinal membrane (vitreous peel)    Allergies  Allergen Reactions  . Keflex [Cephalexin] Diarrhea  . Aspirin     REACTION: difficulty breathing  . Dicyclomine Hcl     REACTION: mouth ulcers  . Metronidazole     REACTION: hives, mouth ulcers  . Moxifloxacin     REACTION: increased heart rate, nausea  . Nsaids     REACTION: difficulty breathing  . Phenergan [Promethazine Hcl]     "knocks" pt out for about 3 days  . Quetiapine     Somnolence. Slept for 36 hours straight.  . Sulfamethoxazole-Trimethoprim     REACTION: increased heart rate, nausea  . Cymbalta [Duloxetine Hcl] Rash   Prior to Admission medications   Medication Sig Start Date End Date Taking? Authorizing Provider  ALPRAZolam (XANAX) 0.25 MG tablet Take 1 tablet (0.25 mg total) by mouth 3 (three) times daily as needed for sleep or anxiety. 04/30/14  Yes Bradd Canary, MD  amphetamine-dextroamphetamine (ADDERALL) 20 MG tablet Take 1 tablet (20 mg total) by mouth 2 (two) times daily. January 2016 rx 09/13/14  Yes Bradd Canary, MD  Cholecalciferol (VITAMIN D3) 2000 UNITS TABS Take by mouth.   Yes Historical Provider, MD  cycloSPORINE (SANDIMMUNE) 25 MG capsule Take 75 mg by mouth 2 (two) times daily.   Yes Historical Provider, MD  HYDROcodone-acetaminophen (NORCO) 10-325 MG per tablet Take 1 tablet by mouth every 6 (six) hours as needed. 09/13/14  Yes Bradd Canary, MD  hydrocortisone (ANUSOL-HC) 25 MG suppository Place 1 suppository (25 mg total) rectally at bedtime. 09/28/14  Yes Bradd Canary, MD  hydrocortisone-pramoxine [facility]) 2.5-1 % rectal cream Place 1 application rectally 2 (two) times daily. For 10 days 09/29/14  Yes Lori P Hvozdovic, PA-C  hydrOXYzine (ATARAX/VISTARIL) 25 MG tablet Take 25 mg by mouth at bedtime as needed.   Yes Historical Provider, MD  levothyroxine (SYNTHROID,  LEVOTHROID) 100 MCG tablet Take 1 tablet (100 mcg total) by mouth daily before breakfast. 04/30/14  Yes Bradd Canary, MD  losartan (COZAAR) 25 MG tablet Take 1 tablet (25 mg total) by mouth daily. 04/30/14  Yes Bradd Canary, MD  metoprolol tartrate (LOPRESSOR) 25 MG tablet Take 0.5 tablets (12.5 mg total) by mouth 2 (two) times daily. 04/30/14  Yes Bradd Canary, MD  nitroGLYCERIN (NITROSTAT) 0.4 MG SL tablet Place 0.4 mg under the tongue every 5 (five) minutes as needed.     Yes Historical Provider, MD  ondansetron (ZOFRAN) 4 MG tablet Take 1 tablet (4 mg total) by mouth every 4 (four) hours as needed for nausea or vomiting. 04/15/14  Yes Hilarie Fredrickson, MD  ranitidine (ZANTAC) 150  MG tablet Take 1 tablet (150 mg total) by mouth 2 (two) times daily. 04/30/14  Yes Bradd Canary, MD  rifaximin (XIFAXAN) 550 MG TABS tablet Take 1 tablet (550 mg total) by mouth 3 (three) times daily. For 14 days 09/29/14  Yes Lori P Hvozdovic, PA-C  sucralfate (CARAFATE) 1 G tablet Take 1 tablet (1 g total) by mouth 4 (four) times daily -  with meals and at bedtime. 04/30/14  Yes Bradd Canary, MD  SUMAtriptan (IMITREX) 50 MG tablet Take 1 tablet (50 mg total) by mouth once. May repeat in 2 hours if headache persists or recurs. 04/30/14  Yes Bradd Canary, MD  venlafaxine XR (EFFEXOR XR) 75 MG 24 hr capsule Take 1 capsule (75 mg total) by mouth daily with breakfast. D/C previous 37.5 mg dose of med 10/04/14  Yes Bradd Canary, MD  vitamin A 8000 UNIT capsule Take 8,000 Units by mouth daily.   Yes Historical Provider, MD   Social History   Social History  . Marital Status: Married    Spouse Name: N/A  . Number of Children: 2  . Years of Education: N/A   Occupational History  . 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 Smoking status: Former Smoker -- 1.00 packs/day for 30 years    Types: Cigarettes    Quit date: 05/15/2009  . Smokeless tobacco: Never Used  . Alcohol Use: No  . Drug Use: No  .  Sexual Activity: No   Other Topics Concern  . Not on file   Social History Narrative    Review of Systems  Constitutional: Positive for chills. Negative for fever.  HENT: Positive for congestion and sore throat.   Respiratory: Positive for cough.       Objective:   Physical Exam  Constitutional: She is oriented to person, place, and time. She appears well-developed and well-nourished. No distress.  HENT:  Head: Normocephalic and atraumatic.  Right Ear: Hearing, tympanic membrane, external ear and ear canal normal.  Left Ear: Hearing, tympanic membrane, external ear and ear canal normal.  Nose: Nose normal.  Mouth/Throat: Oropharynx is clear and moist. No oropharyngeal exudate.  Sinuses are non tender.  Eyes: Conjunctivae and EOM are normal. Pupils are equal, round, and reactive to light.  Neck: Neck supple.  No lymphadenopathy in the neck.   Cardiovascular: Normal rate, regular rhythm, normal heart sounds and intact distal pulses.   No murmur heard. Pulmonary/Chest: Effort normal and breath sounds normal. No respiratory distress. She has no wheezes. She has no rhonchi.  Neurological: She is alert and oriented to person, place, and time. No cranial nerve deficit.  Skin: Skin is warm and dry. No rash noted.  Psychiatric: She has a normal mood and affect. Her behavior is normal.  Nursing note and vitals reviewed.  Filed Vitals:   10/07/14 1349  BP: 128/80  Pulse: 59  Temp: 98.4 F (36.9 C)  TempSrc: Oral  Resp: 18  Height: 5\' 7"  (1.702 m)  Weight: 200 lb (90.719 kg)  SpO2: 98%   Results for orders placed or performed in visit on 10/07/14  POCT CBC  Result Value Ref Range   WBC 5.6 4.6 - 10.2 K/uL   Lymph, poc 1.6 0.6 - 3.4   POC LYMPH PERCENT 28.0 10 - 50 %L   MID (cbc) 0.4 0 - 0.9   POC MID % 7.7 0 - 12 %M   POC Granulocyte 3.6 2 - 6.9  Granulocyte percent 64.3 37 - 80 %G   RBC 4.12 4.04 - 5.48 M/uL   Hemoglobin 11.2 (A) 12.2 - 16.2 g/dL   HCT, POC 19.1 (A)  47.8 - 47.9 %   MCV 86.4 80 - 97 fL   MCH, POC 27.2 27 - 31.2 pg   MCHC 31.4 (A) 31.8 - 35.4 g/dL   RDW, POC 29.5 %   Platelet Count, POC 218 142 - 424 K/uL   MPV 7.6 0 - 99.8 fL    [facility] (PRIMARY) x-ray report read by Dr. Trinna Robinson: chest X-ray-  incomplete inspiration, no apparent infiltrates, suspected hiatal hernia,      Assessment & Plan:   Quandra Molinelli Harpenau is a 54 y.o. female Cough - Plan: DG Chest 2 View, POCT CBC  Acute upper respiratory infection - Plan: DG Chest 2 View, POCT CBC  LRTI (lower respiratory tract infection) - Plan: DG Chest 2 View, POCT CBC   -Suspected viral URI, with cough and early lower respiratory tract infection or acute bronchitis. No apparent infiltrates on chest x-ray, reassuring CBC.  She's had multiple reactions or intolerances to various medications and antibiotics. Decided against antibiotics at this time as appears to be a viral illness  -symptomatic care discussed with saline nasal spray and Mucinex or Mucinex DM.Marland Kitchen RTC precautions given.  No orders of the defined types were placed in this encounter.   Patient Instructions  Saline nasal spray atleast 4 times per day, over the counter mucinex or mucinex DM, drink plenty of fluids, and rest as needed.  Return to the clinic or go to the nearest emergency room if any of your symptoms worsen or new symptoms occur.  Upper Respiratory Infection, Adult An upper respiratory infection (URI) is also sometimes known as the common cold. The upper respiratory tract includes the nose, sinuses, throat, trachea, and bronchi. Bronchi are the airways leading to the lungs. Most people improve within 1 week, but symptoms can last up to 2 weeks. A residual cough may last even longer.  CAUSES Many different viruses can infect the tissues lining the upper respiratory tract. The tissues become irritated and inflamed and often become very moist. Mucus production is also common. A cold is contagious. You can easily spread  the virus to others by oral contact. This includes kissing, sharing a glass, coughing, or sneezing. Touching your mouth or nose and then touching a surface, which is then touched by another person, can also spread the virus. SYMPTOMS  Symptoms typically develop 1 to 3 days after you come in contact with a cold virus. Symptoms vary from person to person. They may include:  Runny nose.  Sneezing.  Nasal congestion.  Sinus irritation.  Sore throat.  Loss of voice (laryngitis).  Cough.  Fatigue.  Muscle aches.  Loss of appetite.  Headache.  Low-grade fever. DIAGNOSIS  You might diagnose your own cold based on familiar symptoms, since most people get a cold 2 to 3 times a year. Your caregiver can confirm this based on your exam. Most importantly, your caregiver can check that your symptoms are not due to another disease such as strep throat, sinusitis, pneumonia, asthma, or epiglottitis. Blood tests, throat tests, and X-rays are not necessary to diagnose a common cold, but they may sometimes be helpful in excluding other more serious diseases. Your caregiver will decide if any further tests are required. RISKS AND COMPLICATIONS  You may be at risk for a more severe case of the common cold if you  smoke cigarettes, have chronic heart disease (such as heart failure) or lung disease (such as asthma), or if you have a weakened immune system. The very young and very old are also at risk for more serious infections. Bacterial sinusitis, middle ear infections, and bacterial pneumonia can complicate the common cold. The common cold can worsen asthma and chronic obstructive pulmonary disease (COPD). Sometimes, these complications can require emergency medical care and may be life-threatening. PREVENTION  The best way to protect against getting a cold is to practice good hygiene. Avoid oral or hand contact with people with cold symptoms. Wash your hands often if contact occurs. There is no clear  evidence that vitamin C, vitamin E, echinacea, or exercise reduces the chance of developing a cold. However, it is always recommended to get plenty of rest and practice good nutrition. TREATMENT  Treatment is directed at relieving symptoms. There is no cure. Antibiotics are not effective, because the infection is caused by a virus, not by bacteria. Treatment may include:  Increased fluid intake. Sports drinks offer valuable electrolytes, sugars, and fluids.  Breathing heated mist or steam (vaporizer or shower).  Eating chicken soup or other clear broths, and maintaining good nutrition.  Getting plenty of rest.  Using gargles or lozenges for comfort.  Controlling fevers with ibuprofen or acetaminophen as directed by your caregiver.  Increasing usage of your inhaler if you have asthma. Zinc gel and zinc lozenges, taken in the first 24 hours of the common cold, can shorten the duration and lessen the severity of symptoms. Pain medicines may help with fever, muscle aches, and throat pain. A variety of non-prescription medicines are available to treat congestion and runny nose. Your caregiver can make recommendations and may suggest nasal or lung inhalers for other symptoms.  HOME CARE INSTRUCTIONS   Only take over-the-counter or prescription medicines for pain, discomfort, or fever as directed by your caregiver.  Use a warm mist humidifier or inhale steam from a shower to increase air moisture. This may keep secretions moist and make it easier to breathe.  Drink enough water and fluids to keep your urine clear or pale yellow.  Rest as needed.  Return to work when your temperature has returned to normal or as your caregiver advises. You may need to stay home longer to avoid infecting others. You can also use a face mask and careful hand washing to prevent spread of the virus. SEEK MEDICAL CARE IF:   After the first few days, you feel you are getting worse rather than better.  You need  your caregiver's advice about medicines to control symptoms.  You develop chills, worsening shortness of breath, or brown or red sputum. These may be signs of pneumonia.  You develop yellow or brown nasal discharge or pain in the face, especially when you bend forward. These may be signs of sinusitis.  You develop a fever, swollen neck glands, pain with swallowing, or white areas in the back of your throat. These may be signs of strep throat. SEEK IMMEDIATE MEDICAL CARE IF:   You have a fever.  You develop severe or persistent headache, ear pain, sinus pain, or chest pain.  You develop wheezing, a prolonged cough, cough up blood, or have a change in your usual mucus (if you have chronic lung disease).  You develop sore muscles or a stiff neck. Document Released: 06/27/2000 Document Revised: 03/26/2011 Document Reviewed: 04/08/2013 [facility] Patient Information 2015 [place], [place]. This information is not intended to replace advice given to  you by your health care provider. Make sure you discuss any questions you have with your health care provider.  Cough, Adult  A cough is a reflex that helps clear your throat and airways. It can help heal the body or may be a reaction to an irritated airway. A cough may only last 2 or 3 weeks (acute) or may last more than 8 weeks (chronic).  CAUSES Acute cough:  Viral or bacterial infections. Chronic cough:  Infections.  Allergies.  Asthma.  Robinson-nasal drip.  Smoking.  Heartburn or acid reflux.  Some medicines.  Chronic lung problems (COPD).  Cancer. SYMPTOMS   Cough.  Fever.  Chest pain.  Increased breathing rate.  High-pitched whistling sound when breathing (wheezing).  Colored mucus that you cough up (sputum). TREATMENT   A bacterial cough may be treated with antibiotic medicine.  A viral cough must run its course and will not respond to antibiotics.  Your caregiver may recommend other treatments if you have a  chronic cough. HOME CARE INSTRUCTIONS   Only take over-the-counter or prescription medicines for pain, discomfort, or fever as directed by your caregiver. Use cough suppressants only as directed by your caregiver.  Use a cold steam vaporizer or humidifier in your bedroom or home to help loosen secretions.  Sleep in a semi-upright position if your cough is worse at night.  Rest as needed.  Stop smoking if you smoke. SEEK IMMEDIATE MEDICAL CARE IF:   You have pus in your sputum.  Your cough starts to worsen.  You cannot control your cough with suppressants and are losing sleep.  You begin coughing up blood.  You have difficulty breathing.  You develop pain which is getting worse or is uncontrolled with medicine.  You have a fever. MAKE SURE YOU:   Understand these instructions.  Will watch your condition.  Will get help right away if you are not doing well or get worse. Document Released: 06/30/2010 Document Revised: 03/26/2011 Document Reviewed: 06/30/2010 [facility] Patient Information 2015 [place], [place]. This information is not intended to replace advice given to you by your health care provider. Make sure you discuss any questions you have with your health care provider.        By signing my name below, I, Rawaa Al Rifaie, attest that this documentation has been prepared under the direction and in the presence of Joan Post, MD.  Broadus John, Medical Scribe. 10/07/2014.  2:59 PM. I personally performed the services described in this documentation, which was scribed in my presence. The recorded information has been reviewed and considered, and addended by me as needed.

## 2014-10-08 ENCOUNTER — Other Ambulatory Visit: Payer: Self-pay | Admitting: Family Medicine

## 2014-10-08 DIAGNOSIS — M5441 Lumbago with sciatica, right side: Secondary | ICD-10-CM

## 2014-10-08 MED ORDER — HYDROCODONE-ACETAMINOPHEN 10-325 MG PO TABS: 1.0000 | ORAL_TABLET | Freq: Four times a day (QID) | ORAL | Status: DC | PRN

## 2014-10-08 NOTE — Telephone Encounter (Signed)
Printed hydrocodone as instructed by PCP per mychart request from patient.  Patient will be notified by mychart to pickup hardcopy.

## 2014-10-10 DIAGNOSIS — K629 Disease of anus and rectum, unspecified: Secondary | ICD-10-CM | POA: Insufficient documentation

## 2014-10-10 NOTE — Assessment & Plan Note (Signed)
On Levothyroxine, continue to monitor 

## 2014-10-10 NOTE — Assessment & Plan Note (Signed)
Well controlled, no changes to meds. Encouraged heart healthy diet such as the DASH diet and exercise as tolerated.  °

## 2014-10-10 NOTE — Progress Notes (Signed)
Subjective:    Patient ID: Joan Robinson, female    DOB: 11-09-1960, 54 y.o.   MRN: [id number]  Chief Complaint  Patient presents with  . Annual Exam    with PAP    HPI Patient is in today for annual exam. She is complaining of about a week's worth of rectal pressure and some intermittent rectal bleeding. She has tried some topical treatments without any great effect and is having trouble getting comfortably. She's had minor trouble off and on for about a month but symptoms worsened this past week. She denies any significant diarrhea has struggled with low-grade constipation. No fevers or chills. Is also complaining of a couple months with of intermittent foot and leg cramps. Worse with activity. No recent injury or trauma. Will get her flu shot at work. Has a history of a hysterectomy and a rectocele repair. Denies any GYN complaints today. Is following with Dr. Dierdre Forth of rheumatology for her chronic arthritic condition. Denies CP/palp/SOB/HA/congestion/fevers or GU c/o. Taking meds as prescribed  Past Medical History  Diagnosis Date  . Allergic rhinitis   . Adenomatous colon polyp   . Diverticulosis   . Colitis, Clostridium difficile 5/11,6/11  . Hypothyroidism   . Urinary incontinence   . PUD (peptic ulcer disease)   . Tobacco abuse   . Hypertension   . Hyperlipidemia   . Colon polyp     benign  . Anemia 10/10/2011  . Preventative health care 10/10/2011  . Sinusitis acute 10/10/2011  . Chicken pox as a child    X 2  . Mumps as a child  . Lower back pain   . 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 Staph aureus infection 12/18/2011    Recurrent lesions in nares  . Perimenopausal 01/16/2012  . Anxiety and depression 01/17/2007    Qualifier: Diagnosis of  By: Everardo All MD, Cleophas Dunker   . 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 Right knee pain 05/10/2012  . BCC (basal cell carcinoma of skin) 06/01/2012    Leg Follows with Dr Margo Aye  . Headache(784.0) 08/17/2012  . Bipolar disorder 01/17/2007   Qualifier: Diagnosis of  By: Everardo All MD, Cleophas Dunker   . 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 Arthritis 07/24/2013    Likely inflammatory and following with Dr Maryln Gottron of [facility]  rheumatology  . Diverticulosis   . Hypothyroidism 08/24/2006    Qualifier: Diagnosis of  By: Everardo All MD, Sean A    . 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 Obesity 11/01/2013    Past Surgical History  Procedure Laterality Date  . Tubal ligation  1995  . Uterine suspension      mesh  . Colonoscopy    . Gated spect wall motion stress cardiolite  11/05/2001  . Polypectomy    . Abdominal hysterectomy    . Abdominal hysterectomy  2011    complete  . Dilation and curettage of uterus  1985  . Vitrectomy Bilateral 03/03/14  . Eye surgery  03/03/14    Surgery on right eye for epiretinal membrane (vitreous peel)     Family History  Problem Relation Age of Onset  . Heart disease Mother     stents  . Stroke Mother   . Hyperlipidemia Mother   . Hypertension Mother   . Other Mother     blood disorder- mgus  . Colon polyps Father   . Other Father     aorta disection  . Aneurysm Father   . Colon cancer Paternal Uncle   .  Irritable bowel syndrome Daughter   . Other Daughter     gastritis  . Mental illness Daughter     bipolar and mood disorder  . Hypertension Sister     ?  Marland Kitchen Mental illness Son     bipolar  . Arthritis Sister   . Other Sister     thyroid  . Other Sister     thyroid    Social History   Social History  . Marital Status: Married    Spouse Name: N/A  . Number of Children: 2  . Years of Education: N/A   Occupational History  . 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 Smoking status: Former Smoker -- 1.00 packs/day for 30 years    Types: Cigarettes    Quit date: 05/15/2009  . Smokeless tobacco: Never Used  . Alcohol Use: No  . Drug Use: No  . Sexual Activity: No   Other Topics Concern  . Not on file   Social History Narrative    Outpatient Prescriptions Prior to  Visit  Medication Sig Dispense Refill  . ALPRAZolam (XANAX) 0.25 MG tablet Take 1 tablet (0.25 mg total) by mouth 3 (three) times daily as needed for sleep or anxiety. 30 tablet 3  . amphetamine-dextroamphetamine (ADDERALL) 20 MG tablet Take 1 tablet (20 mg total) by mouth 2 (two) times daily. January 2016 rx 60 tablet 0  . cycloSPORINE (SANDIMMUNE) 25 MG capsule Take 75 mg by mouth 2 (two) times daily.    . hydrOXYzine (ATARAX/VISTARIL) 25 MG tablet Take 25 mg by mouth at bedtime as needed.    Marland Kitchen levothyroxine (SYNTHROID, LEVOTHROID) 100 MCG tablet Take 1 tablet (100 mcg total) by mouth daily before breakfast. 30 tablet 3  . losartan (COZAAR) 25 MG tablet Take 1 tablet (25 mg total) by mouth daily. 30 tablet 5  . metoprolol tartrate (LOPRESSOR) 25 MG tablet Take 0.5 tablets (12.5 mg total) by mouth 2 (two) times daily. 90 tablet 1  . nitroGLYCERIN (NITROSTAT) 0.4 MG SL tablet Place 0.4 mg under the tongue every 5 (five) minutes as needed.      . ondansetron (ZOFRAN) 4 MG tablet Take 1 tablet (4 mg total) by mouth every 4 (four) hours as needed for nausea or vomiting. 30 tablet 1  . ranitidine (ZANTAC) 150 MG tablet Take 1 tablet (150 mg total) by mouth 2 (two) times daily. 180 tablet 1  . sucralfate (CARAFATE) 1 G tablet Take 1 tablet (1 g total) by mouth 4 (four) times daily -  with meals and at bedtime. 120 tablet 3  . SUMAtriptan (IMITREX) 50 MG tablet Take 1 tablet (50 mg total) by mouth once. May repeat in 2 hours if headache persists or recurs. 10 tablet 0  . calcium carbonate (TUMS E-X 750) 750 MG chewable tablet Chew 2 tablets by mouth daily.      Marland Kitchen HYDROcodone-acetaminophen (NORCO) 10-325 MG per tablet Take 1 tablet by mouth every 6 (six) hours as needed. 70 tablet 0  . venlafaxine XR (EFFEXOR XR) 75 MG 24 hr capsule Take 1 capsule (75 mg total) by mouth daily with breakfast. D/C previous 37.5 mg dose of med 30 capsule 5  . hydrocortisone (ANUSOL-HC) 25 MG suppository Place 1 suppository  (25 mg total) rectally at bedtime as needed for hemorrhoids or itching. 12 suppository 1  . Loperamide HCl (IMODIUM A-D PO) Take by mouth.    . loratadine (CLARITIN) 10 MG tablet Take 1 tablet (10 mg total)  by mouth 2 (two) times daily as needed for allergies or itching (dermatitis). 180 tablet 1  . Lorcaserin HCl (BELVIQ) 10 MG TABS Take 1 tablet by mouth 2 (two) times daily. 60 tablet 2  . Probiotic Product (PROBIOTIC FORMULA) CAPS Take 1 capsule by mouth daily.      Marland Kitchen venlafaxine XR (EFFEXOR-XR) 37.5 MG 24 hr capsule Take 1 capsule (37.5 mg total) by mouth daily. 30 capsule 1   No facility-administered medications prior to visit.    Allergies  Allergen Reactions  . Keflex [Cephalexin] Diarrhea  . Aspirin     REACTION: difficulty breathing  . Dicyclomine Hcl     REACTION: mouth ulcers  . Metronidazole     REACTION: hives, mouth ulcers  . Moxifloxacin     REACTION: increased heart rate, nausea  . Nsaids     REACTION: difficulty breathing  . Phenergan [Promethazine Hcl]     "knocks" pt out for about 3 days  . Quetiapine     Somnolence. Slept for 36 hours straight.  . Sulfamethoxazole-Trimethoprim     REACTION: increased heart rate, nausea  . Cymbalta [Duloxetine Hcl] Rash    Review of Systems  Constitutional: Negative for fever, chills and malaise/fatigue.  HENT: Negative for congestion and hearing loss.   Eyes: Negative for discharge.  Respiratory: Negative for cough, sputum production and shortness of breath.   Cardiovascular: Negative for chest pain, palpitations and leg swelling.  Gastrointestinal: Positive for constipation and blood in stool. Negative for heartburn, nausea, vomiting, abdominal pain and diarrhea.  Genitourinary: Negative for dysuria, urgency, frequency and hematuria.  Musculoskeletal: Positive for joint pain. Negative for myalgias, back pain and falls.  Skin: Negative for rash.  Neurological: Negative for dizziness, sensory change, loss of consciousness,  weakness and headaches.  Endo/Heme/Allergies: Negative for environmental allergies. Does not bruise/bleed easily.  Psychiatric/Behavioral: Negative for depression and suicidal ideas. The patient is nervous/anxious. The patient does not have insomnia.        Objective:    Physical Exam  Constitutional: She is oriented to person, place, and time. She appears well-developed and well-nourished. No distress.  HENT:  Head: Normocephalic and atraumatic.  Eyes: Conjunctivae are normal.  Neck: Neck supple. No thyromegaly present.  Cardiovascular: Normal rate, regular rhythm and normal heart sounds.   No murmur heard. Pulmonary/Chest: Effort normal and breath sounds normal. No respiratory distress.  Abdominal: Soft. Bowel sounds are normal. She exhibits no distension and no mass. There is no tenderness.  Genitourinary: Guaiac positive stool.  External hemorrhoid noted. No external vaginal lesions.  Musculoskeletal: She exhibits no edema.  Lymphadenopathy:    She has no cervical adenopathy.  Neurological: She is alert and oriented to person, place, and time.  Skin: Skin is warm and dry.  Psychiatric: She has a normal mood and affect. Her behavior is normal.    BP 140/79 mmHg  Pulse 78  Temp(Src) 98.3 F (36.8 C) (Oral)  Ht 5' 6.5" (1.689 m)  Wt 199 lb 9.6 oz (90.538 kg)  BMI 31.74 kg/m2  SpO2 100% Wt Readings from Last 3 Encounters:  10/07/14 200 lb (90.719 kg)  09/29/14 198 lb 6.4 oz (89.994 kg)  09/28/14 199 lb 9.6 oz (90.538 kg)     Lab Results  Component Value Date   WBC 5.6 10/07/2014   HGB 11.2* 10/07/2014   HCT 35.6* 10/07/2014   PLT 252.0 09/28/2014   GLUCOSE 100* 09/28/2014   CHOL 211* 04/30/2014   TRIG 224.0* 04/30/2014   HDL 44.30  04/30/2014   LDLDIRECT 131.0 04/30/2014   ALT 19 09/28/2014   AST 18 09/28/2014   NA 138 09/28/2014   K 4.0 09/28/2014   CL 101 09/28/2014   CREATININE 0.94 09/28/2014   BUN 16 09/28/2014   CO2 27 09/28/2014   TSH 0.83  09/28/2014   INR 1.0 09/02/2008    Lab Results  Component Value Date   TSH 0.83 09/28/2014   Lab Results  Component Value Date   WBC 5.6 10/07/2014   HGB 11.2* 10/07/2014   HCT 35.6* 10/07/2014   MCV 86.4 10/07/2014   PLT 252.0 09/28/2014   Lab Results  Component Value Date   NA 138 09/28/2014   K 4.0 09/28/2014   CO2 27 09/28/2014   GLUCOSE 100* 09/28/2014   BUN 16 09/28/2014   CREATININE 0.94 09/28/2014   BILITOT 0.5 09/28/2014   ALKPHOS 124* 09/28/2014   AST 18 09/28/2014   ALT 19 09/28/2014   PROT 7.7 09/28/2014   ALBUMIN 4.6 09/28/2014   CALCIUM 9.5 09/28/2014   GFR 66.01 09/28/2014   Lab Results  Component Value Date   CHOL 211* 04/30/2014   Lab Results  Component Value Date   HDL 44.30 04/30/2014   No results found for: [facility] Lab Results  Component Value Date   TRIG 224.0* 04/30/2014   Lab Results  Component Value Date   CHOLHDL 5 04/30/2014   No results found for: HGBA1C     Assessment & Plan:   Problem List Items Addressed This Visit    Rectal lesion    Very uncomfortable. Is encouraged to increase fiber and fluids in diet. Given Anusol HC suppositories to use prn and referred to GI for further consideration      Relevant Medications   hydrocortisone (ANUSOL-HC) 25 MG suppository   Other Relevant Orders   Ambulatory referral to Gastroenterology   Preventative health care    Patient encouraged to maintain heart healthy diet, regular exercise, adequate sleep. Consider daily probiotics. Take medications as prescribed. Previously seen by OB/GYN and did have a TAH SBO in 2011 with mesh and a rectal sling in place. C/o roughly 4 episodes of bleeding, she thinks vaginal but could have been rectal since then, gnerally a small amount and occurs after some cramping Sees Dr Margo Aye of dermatology.  Labs reviewed. Given and reviewed copy of ACP documents from [employer] and encouraged to complete and return.      Hypothyroidism - Primary     On Levothyroxine, continue to monitor      Relevant Orders   Comp Met (CMET) (Completed)   Magnesium (Completed)   TSH (Completed)   CBC (Completed)   Sed Rate (ESR) (Completed)   Hyperlipidemia, mixed   Relevant Orders   Comp Met (CMET) (Completed)   Magnesium (Completed)   TSH (Completed)   CBC (Completed)   Sed Rate (ESR) (Completed)   GERD (Chronic)    Avoid offending foods, start probiotics. Do not eat large meals in late evening and consider raising head of bed.       Essential hypertension    Well controlled, no changes to meds. Encouraged heart healthy diet such as the DASH diet and exercise as tolerated.       Relevant Orders   Comp Met (CMET) (Completed)   Magnesium (Completed)   TSH (Completed)   CBC (Completed)   Sed Rate (ESR) (Completed)   ADD (attention deficit disorder)    Uses Adderall intermittently.  I have discontinued Ms. Schnurr's calcium carbonate, PROBIOTIC FORMULA, Loperamide HCl (IMODIUM A-D PO), hydrocortisone, venlafaxine XR, Lorcaserin HCl, and loratadine. I am also having her start on hydrocortisone. Additionally, I am having her maintain her nitroGLYCERIN, ondansetron, cycloSPORINE, hydrOXYzine, SUMAtriptan, sucralfate, ranitidine, metoprolol tartrate, levothyroxine, ALPRAZolam, losartan, amphetamine-dextroamphetamine, vitamin A, and Vitamin D3.  Meds ordered this encounter  Medications  . vitamin A 8000 UNIT capsule    Sig: Take 8,000 Units by mouth daily.  . Cholecalciferol (VITAMIN D3) 2000 UNITS TABS    Sig: Take by mouth.  . hydrocortisone (ANUSOL-HC) 25 MG suppository    Sig: Place 1 suppository (25 mg total) rectally at bedtime.    Dispense:  12 suppository    Refill:  0     Danise Edge, MD

## 2014-10-10 NOTE — Assessment & Plan Note (Signed)
Very uncomfortable. Is encouraged to increase fiber and fluids in diet. Given Anusol HC suppositories to use prn and referred to GI for further consideration

## 2014-10-10 NOTE — Assessment & Plan Note (Signed)
Avoid offending foods, start probiotics. Do not eat large meals in late evening and consider raising head of bed.  

## 2014-10-10 NOTE — Assessment & Plan Note (Signed)
Uses Adderall intermittently.

## 2014-10-10 NOTE — Assessment & Plan Note (Signed)
Patient encouraged to maintain heart healthy diet, regular exercise, adequate sleep. Consider daily probiotics. Take medications as prescribed. Previously seen by OB/GYN and did have a TAH SBO in 2011 with mesh and a rectal sling in place. C/o roughly 4 episodes of bleeding, she thinks vaginal but could have been rectal since then, gnerally a small amount and occurs after some cramping Sees Dr Nevada Crane of dermatology.  Labs reviewed. Given and reviewed copy of ACP documents from [employer] and encouraged to complete and return.

## 2014-10-11 ENCOUNTER — Other Ambulatory Visit: Payer: Self-pay | Admitting: Family Medicine

## 2014-10-11 DIAGNOSIS — F988 Other specified behavioral and emotional disorders with onset usually occurring in childhood and adolescence: Secondary | ICD-10-CM

## 2014-10-11 MED ORDER — AMPHETAMINE-DEXTROAMPHETAMINE 20 MG PO TABS: 20.0000 mg | ORAL_TABLET | Freq: Two times a day (BID) | ORAL | Status: DC

## 2014-10-11 NOTE — Telephone Encounter (Signed)
Adderall printed and on counter for signature.  Attached note to hardcopy to get UDS done before getting hardcopy.  Patient notified of response by mychart.

## 2014-10-19 ENCOUNTER — Encounter: Payer: Self-pay | Admitting: Family Medicine

## 2014-10-27 ENCOUNTER — Other Ambulatory Visit: Payer: Self-pay | Admitting: Family Medicine

## 2014-10-27 DIAGNOSIS — I10 Essential (primary) hypertension: Secondary | ICD-10-CM

## 2014-10-27 MED ORDER — METOPROLOL TARTRATE 25 MG PO TABS: 12.5000 mg | ORAL_TABLET | Freq: Two times a day (BID) | ORAL | Status: DC

## 2014-11-04 ENCOUNTER — Other Ambulatory Visit: Payer: Self-pay | Admitting: Family Medicine

## 2014-11-05 ENCOUNTER — Other Ambulatory Visit: Payer: Self-pay | Admitting: Family Medicine

## 2014-11-05 DIAGNOSIS — M5441 Lumbago with sciatica, right side: Secondary | ICD-10-CM

## 2014-11-05 MED ORDER — HYDROCODONE-ACETAMINOPHEN 10-325 MG PO TABS: 1.0000 | ORAL_TABLET | Freq: Four times a day (QID) | ORAL | Status: DC | PRN

## 2014-11-19 ENCOUNTER — Other Ambulatory Visit: Payer: Self-pay | Admitting: Family Medicine

## 2014-11-19 MED ORDER — LOSARTAN POTASSIUM 25 MG PO TABS: 25.0000 mg | ORAL_TABLET | Freq: Every day | ORAL | Status: DC

## 2014-11-23 ENCOUNTER — Encounter: Payer: Self-pay | Admitting: Physician Assistant

## 2014-11-23 ENCOUNTER — Ambulatory Visit (INDEPENDENT_AMBULATORY_CARE_PROVIDER_SITE_OTHER): Payer: Managed Care, Other (non HMO) | Admitting: Physician Assistant

## 2014-11-23 VITALS — BP 123/76 | HR 77 | Temp 98.1°F | Resp 18 | Ht 67.0 in | Wt 203.0 lb

## 2014-11-23 DIAGNOSIS — B9689 Other specified bacterial agents as the cause of diseases classified elsewhere: Secondary | ICD-10-CM

## 2014-11-23 DIAGNOSIS — J019 Acute sinusitis, unspecified: Secondary | ICD-10-CM | POA: Diagnosis not present

## 2014-11-23 MED ORDER — AMOXICILLIN-POT CLAVULANATE 875-125 MG PO TABS: 1.0000 | ORAL_TABLET | Freq: Two times a day (BID) | ORAL | Status: DC

## 2014-11-23 NOTE — Patient Instructions (Signed)
Please take antibiotic as directed.  Increase fluid intake.  Use Saline nasal spray.  Take a daily multivitamin. Continue OTC medications.  Place a humidifier in the bedroom.  Please call or return clinic if symptoms are not improving.  Consider starting a daily 30-50 mg zinc supplement  Sinusitis Sinusitis is redness, soreness, and swelling (inflammation) of the paranasal sinuses. Paranasal sinuses are air pockets within the bones of your face (beneath the eyes, the middle of the forehead, or above the eyes). In healthy paranasal sinuses, mucus is able to drain out, and air is able to circulate through them by way of your nose. However, when your paranasal sinuses are inflamed, mucus and air can become trapped. This can allow bacteria and other germs to grow and cause infection. Sinusitis can develop quickly and last only a short time (acute) or continue over a long period (chronic). Sinusitis that lasts for more than 12 weeks is considered chronic.  CAUSES  Causes of sinusitis include:  Allergies.  Structural abnormalities, such as displacement of the cartilage that separates your nostrils (deviated septum), which can decrease the air flow through your nose and sinuses and affect sinus drainage.  Functional abnormalities, such as when the small hairs (cilia) that line your sinuses and help remove mucus do not work properly or are not present. SYMPTOMS  Symptoms of acute and chronic sinusitis are the same. The primary symptoms are pain and pressure around the affected sinuses. Other symptoms include:  Upper toothache.  Earache.  Headache.  Bad breath.  Decreased sense of smell and taste.  A cough, which worsens when you are lying flat.  Fatigue.  Fever.  Thick drainage from your nose, which often is green and may contain pus (purulent).  Swelling and warmth over the affected sinuses. DIAGNOSIS  Your caregiver will perform a physical exam. During the exam, your caregiver  may:  Look in your nose for signs of abnormal growths in your nostrils (nasal polyps).  Tap over the affected sinus to check for signs of infection.  View the inside of your sinuses (endoscopy) with a special imaging device with a light attached (endoscope), which is inserted into your sinuses. If your caregiver suspects that you have chronic sinusitis, one or more of the following tests may be recommended:  Allergy tests.  Nasal culture A sample of mucus is taken from your nose and sent to a lab and screened for bacteria.  Nasal cytology A sample of mucus is taken from your nose and examined by your caregiver to determine if your sinusitis is related to an allergy. TREATMENT  Most cases of acute sinusitis are related to a viral infection and will resolve on their own within 10 days. Sometimes medicines are prescribed to help relieve symptoms (pain medicine, decongestants, nasal steroid sprays, or saline sprays).  However, for sinusitis related to a bacterial infection, your caregiver will prescribe antibiotic medicines. These are medicines that will help kill the bacteria causing the infection.  Rarely, sinusitis is caused by a fungal infection. In theses cases, your caregiver will prescribe antifungal medicine. For some cases of chronic sinusitis, surgery is needed. Generally, these are cases in which sinusitis recurs more than 3 times per year, despite other treatments. HOME CARE INSTRUCTIONS   Drink plenty of water. Water helps thin the mucus so your sinuses can drain more easily.  Use a humidifier.  Inhale steam 3 to 4 times a day (for example, sit in the bathroom with the shower running).  Apply a  warm, moist washcloth to your face 3 to 4 times a day, or as directed by your caregiver.  Use saline nasal sprays to help moisten and clean your sinuses.  Take over-the-counter or prescription medicines for pain, discomfort, or fever only as directed by your caregiver. SEEK IMMEDIATE  MEDICAL CARE IF:  You have increasing pain or severe headaches.  You have nausea, vomiting, or drowsiness.  You have swelling around your face.  You have vision problems.  You have a stiff neck.  You have difficulty breathing. MAKE SURE YOU:   Understand these instructions.  Will watch your condition.  Will get help right away if you are not doing well or get worse. Document Released: 01/01/2005 Document Revised: 03/26/2011 Document Reviewed: 01/16/2011 [facility] Patient Information 2014 [place], [place].

## 2014-11-23 NOTE — Progress Notes (Signed)
Patient presents to clinic today c/o 2 weeks of sinus pressure, sinus pain, ear pressure and pain bilateral with PND and productive cough. Endorses some fatigue and sweats when she feels hot. Denies fever, chest pain. Denies recent travel.  Past Medical History  Diagnosis Date  . Allergic rhinitis   . Adenomatous colon polyp   . Diverticulosis   . Colitis, Clostridium difficile 5/11,6/11  . Hypothyroidism   . Urinary incontinence   . PUD (peptic ulcer disease)   . Tobacco abuse   . Hypertension   . Hyperlipidemia   . Colon polyp     benign  . Anemia 10/10/2011  . Preventative health care 10/10/2011  . Sinusitis acute 10/10/2011  . Chicken pox as a child    X 2  . Mumps as a child  . Lower back pain   . 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 Staph aureus infection 12/18/2011    Recurrent lesions in nares  . Perimenopausal 01/16/2012  . Anxiety and depression 01/17/2007    Qualifier: Diagnosis of  By: Loanne Drilling MD, Jacelyn Pi   . 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 Right knee pain 05/10/2012  . BCC (basal cell carcinoma of skin) 06/01/2012    Leg Follows with Dr Nevada Crane  . Headache(784.0) 08/17/2012  . Bipolar disorder ([place]) 01/17/2007    Qualifier: Diagnosis of  By: Loanne Drilling MD, Jacelyn Pi   . 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 Arthritis 07/24/2013    Likely inflammatory and following with Dr Lynnda Shields of [facility]  rheumatology  . Diverticulosis   . Hypothyroidism 08/24/2006    Qualifier: Diagnosis of  By: Loanne Drilling MD, Sean A    . 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 Obesity 11/01/2013    Current Outpatient Prescriptions on File Prior to Visit  Medication Sig Dispense Refill  . ALPRAZolam (XANAX) 0.25 MG tablet Take 1 tablet (0.25 mg total) by mouth 3 (three) times daily as needed for sleep or anxiety. 30 tablet 3  . amphetamine-dextroamphetamine (ADDERALL) 20 MG tablet Take 1 tablet (20 mg total) by mouth 2 (two) times daily. September 2016 rx 60 tablet 0  . Cholecalciferol (VITAMIN D3) 2000 UNITS  TABS Take by mouth.    Marland Kitchen HYDROcodone-acetaminophen (NORCO) 10-325 MG tablet Take 1 tablet by mouth every 6 (six) hours as needed. 70 tablet 0  . hydrocortisone (ANUSOL-HC) 25 MG suppository Place 1 suppository (25 mg total) rectally at bedtime. (Patient taking differently: Place 25 mg rectally 3 times/day as needed-between meals & bedtime. ) 12 suppository 0  . hydrOXYzine (ATARAX/VISTARIL) 25 MG tablet Take 25 mg by mouth at bedtime as needed.    Marland Kitchen levothyroxine (SYNTHROID, LEVOTHROID) 100 MCG tablet Take 1 tablet (100 mcg total) by mouth daily before breakfast. 30 tablet 3  . losartan (COZAAR) 25 MG tablet Take 1 tablet (25 mg total) by mouth daily. 30 tablet 5  . metoprolol tartrate (LOPRESSOR) 25 MG tablet Take 0.5 tablets (12.5 mg total) by mouth 2 (two) times daily. 90 tablet 1  . nitroGLYCERIN (NITROSTAT) 0.4 MG SL tablet Place 0.4 mg under the tongue every 5 (five) minutes as needed.      . ondansetron (ZOFRAN) 4 MG tablet Take 1 tablet (4 mg total) by mouth every 4 (four) hours as needed for nausea or vomiting. 30 tablet 1  . ranitidine (ZANTAC) 150 MG tablet Take 1 tablet (150 mg total) by mouth 2 (two) times daily. 180 tablet 1  . sucralfate (CARAFATE) 1 G tablet Take 1 tablet (1 g  total) by mouth 4 (four) times daily -  with meals and at bedtime. 120 tablet 3  . SUMAtriptan (IMITREX) 50 MG tablet Take 1 tablet (50 mg total) by mouth once. May repeat in 2 hours if headache persists or recurs. 10 tablet 0  . venlafaxine XR (EFFEXOR XR) 75 MG 24 hr capsule Take 1 capsule (75 mg total) by mouth daily with breakfast. D/C previous 37.5 mg dose of med 30 capsule 5  . vitamin A 8000 UNIT capsule Take 8,000 Units by mouth daily.    . hydrocortisone-pramoxine (ANALPRAM HC) 2.5-1 % rectal cream Place 1 application rectally 2 (two) times daily. For 10 days (Patient not taking: Reported on 11/23/2014) 30 g 1   No current facility-administered medications on file prior to visit.    Allergies    Allergen Reactions  . Keflex [Cephalexin] Diarrhea  . Aspirin     REACTION: difficulty breathing  . Dicyclomine Hcl     REACTION: mouth ulcers  . Metronidazole     REACTION: hives, mouth ulcers  . Moxifloxacin     REACTION: increased heart rate, nausea  . Nsaids     REACTION: difficulty breathing  . Phenergan [Promethazine Hcl]     "knocks" pt out for about 3 days  . Quetiapine     Somnolence. Slept for 36 hours straight.  . Sulfamethoxazole-Trimethoprim     REACTION: increased heart rate, nausea  . Cymbalta [Duloxetine Hcl] Rash    Family History  Problem Relation Age of Onset  . Heart disease Mother     stents  . Stroke Mother   . Hyperlipidemia Mother   . Hypertension Mother   . Other Mother     blood disorder- mgus  . Colon polyps Father   . Other Father     aorta disection  . Aneurysm Father   . Colon cancer Paternal Uncle   . Irritable bowel syndrome Daughter   . Other Daughter     gastritis  . Mental illness Daughter     bipolar and mood disorder  . Hypertension Sister     ?  Marland Kitchen Mental illness Son     bipolar  . Arthritis Sister   . Other Sister     thyroid  . Other Sister     thyroid    Social History   Social History  . Marital Status: Married    Spouse Name: N/A  . Number of Children: 2  . Years of Education: N/A   Occupational History  . 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 Smoking status: Former Smoker -- 1.00 packs/day for 30 years    Types: Cigarettes    Quit date: 05/15/2009  . Smokeless tobacco: Never Used  . Alcohol Use: No  . Drug Use: No  . Sexual Activity: No   Other Topics Concern  . None   Social History Narrative   Review of Systems - See HPI.  All other ROS are negative.  BP 123/76 mmHg  Pulse 77  Temp(Src) 98.1 F (36.7 C) (Oral)  Resp 18  Ht '5\' 7"'  (1.702 m)  Wt 203 lb (92.08 kg)  BMI 31.79 kg/m2  SpO2 100%  Physical Exam  Constitutional: She is oriented to person, place, and time and  well-developed, well-nourished, and in no distress.  HENT:  Head: Normocephalic and atraumatic.  Right Ear: External ear normal.  Left Ear: External ear normal.  Nose: Nose normal.  Mouth/Throat: Oropharynx is clear and moist. No  oropharyngeal exudate.  TM within normal limits. + TTP of sinuses  Eyes: Conjunctivae are normal.  Neck: Neck supple.  Cardiovascular: Normal rate, regular rhythm, normal heart sounds and intact distal pulses.   Pulmonary/Chest: Effort normal and breath sounds normal. No respiratory distress. She has no wheezes. She has no rales. She exhibits no tenderness.  Lymphadenopathy:    She has no cervical adenopathy.  Neurological: She is alert and oriented to person, place, and time.  Skin: Skin is warm and dry. No rash noted.  Psychiatric: Affect normal.  Vitals reviewed.  Recent Results (from the past 2160 hour(s))  Comp Met (CMET)     Status: Abnormal   Collection Time: 09/28/14  4:59 PM  Result Value Ref Range   Sodium 138 135 - 145 mEq/L   Potassium 4.0 3.5 - 5.1 mEq/L   Chloride 101 96 - 112 mEq/L   CO2 27 19 - 32 mEq/L   Glucose, Bld 100 (H) 70 - 99 mg/dL   BUN 16 6 - 23 mg/dL   Creatinine, Ser 0.94 0.40 - 1.20 mg/dL   Total Bilirubin 0.5 0.2 - 1.2 mg/dL   Alkaline Phosphatase 124 (H) 39 - 117 U/L   AST 18 0 - 37 U/L   ALT 19 0 - 35 U/L   Total Protein 7.7 6.0 - 8.3 g/dL   Albumin 4.6 3.5 - 5.2 g/dL   Calcium 9.5 8.4 - 10.5 mg/dL   GFR 66.01 >60.00 mL/min  Magnesium     Status: None   Collection Time: 09/28/14  4:59 PM  Result Value Ref Range   Magnesium 2.0 1.5 - 2.5 mg/dL  TSH     Status: None   Collection Time: 09/28/14  4:59 PM  Result Value Ref Range   TSH 0.83 0.35 - 4.50 uIU/mL  CBC     Status: None   Collection Time: 09/28/14  4:59 PM  Result Value Ref Range   WBC 7.5 4.0 - 10.5 K/uL   RBC 4.44 3.87 - 5.11 Mil/uL   Platelets 252.0 150.0 - 400.0 K/uL   Hemoglobin 12.9 12.0 - 15.0 g/dL   HCT 38.6 36.0 - 46.0 %   MCV 87.0 78.0 -  100.0 fl   MCHC 33.3 30.0 - 36.0 g/dL   RDW 13.1 11.5 - 15.5 %  Sed Rate (ESR)     Status: Abnormal   Collection Time: 09/28/14  4:59 PM  Result Value Ref Range   Sed Rate 26 (H) 0 - 22 mm/hr  POCT CBC     Status: Abnormal   Collection Time: 10/07/14  3:07 PM  Result Value Ref Range   WBC 5.6 4.6 - 10.2 K/uL   Lymph, poc 1.6 0.6 - 3.4   POC LYMPH PERCENT 28.0 10 - 50 %L   MID (cbc) 0.4 0 - 0.9   POC MID % 7.7 0 - 12 %M   POC Granulocyte 3.6 2 - 6.9   Granulocyte percent 64.3 37 - 80 %G   RBC 4.12 4.04 - 5.48 M/uL   Hemoglobin 11.2 (A) 12.2 - 16.2 g/dL   HCT, POC 35.6 (A) 37.7 - 47.9 %   MCV 86.4 80 - 97 fL   MCH, POC 27.2 27 - 31.2 pg   MCHC 31.4 (A) 31.8 - 35.4 g/dL   RDW, POC 13.7 %   Platelet Count, POC 218 142 - 424 K/uL   MPV 7.6 0 - 99.8 fL    Assessment/Plan: Acute bacterial sinusitis Rx Augmentin.  Increase  fluids.  Rest.  Saline nasal spray.  Probiotic.  Mucinex as directed.  Humidifier in bedroom.  Call or return to clinic if symptoms are not improving.

## 2014-11-23 NOTE — Assessment & Plan Note (Signed)
Rx Augmentin.  Increase fluids.  Rest.  Saline nasal spray.  Probiotic.  Mucinex as directed.  Humidifier in bedroom.  Call or return to clinic if symptoms are not improving.  

## 2014-11-23 NOTE — Progress Notes (Signed)
Pre visit review using our clinic review tool, if applicable. No additional management support is needed unless otherwise documented below in the visit note/SLS  

## 2014-12-02 ENCOUNTER — Other Ambulatory Visit: Payer: Self-pay | Admitting: Family Medicine

## 2014-12-02 DIAGNOSIS — E039 Hypothyroidism, unspecified: Secondary | ICD-10-CM

## 2014-12-02 MED ORDER — LEVOTHYROXINE SODIUM 100 MCG PO TABS: 100.0000 ug | ORAL_TABLET | Freq: Every day | ORAL | Status: DC

## 2014-12-06 ENCOUNTER — Other Ambulatory Visit: Payer: Self-pay | Admitting: Family Medicine

## 2014-12-07 ENCOUNTER — Other Ambulatory Visit: Payer: Self-pay | Admitting: Family Medicine

## 2014-12-07 DIAGNOSIS — F988 Other specified behavioral and emotional disorders with onset usually occurring in childhood and adolescence: Secondary | ICD-10-CM

## 2014-12-07 DIAGNOSIS — M5441 Lumbago with sciatica, right side: Secondary | ICD-10-CM

## 2014-12-07 MED ORDER — AMPHETAMINE-DEXTROAMPHETAMINE 20 MG PO TABS: 20.0000 mg | ORAL_TABLET | Freq: Two times a day (BID) | ORAL | Status: DC

## 2014-12-07 MED ORDER — HYDROCODONE-ACETAMINOPHEN 10-325 MG PO TABS: 1.0000 | ORAL_TABLET | Freq: Four times a day (QID) | ORAL | Status: DC | PRN

## 2014-12-20 DIAGNOSIS — H25012 Cortical age-related cataract, left eye: Secondary | ICD-10-CM | POA: Insufficient documentation

## 2014-12-28 ENCOUNTER — Encounter: Payer: Self-pay | Admitting: Family Medicine

## 2014-12-28 ENCOUNTER — Ambulatory Visit (INDEPENDENT_AMBULATORY_CARE_PROVIDER_SITE_OTHER): Payer: Managed Care, Other (non HMO) | Admitting: Family Medicine

## 2014-12-28 VITALS — BP 136/80 | HR 75 | Temp 98.6°F | Ht 67.0 in | Wt 205.1 lb

## 2014-12-28 DIAGNOSIS — D649 Anemia, unspecified: Secondary | ICD-10-CM

## 2014-12-28 DIAGNOSIS — R1013 Epigastric pain: Secondary | ICD-10-CM

## 2014-12-28 DIAGNOSIS — F329 Major depressive disorder, single episode, unspecified: Secondary | ICD-10-CM

## 2014-12-28 DIAGNOSIS — F909 Attention-deficit hyperactivity disorder, unspecified type: Secondary | ICD-10-CM

## 2014-12-28 DIAGNOSIS — E782 Mixed hyperlipidemia: Secondary | ICD-10-CM

## 2014-12-28 DIAGNOSIS — M5441 Lumbago with sciatica, right side: Secondary | ICD-10-CM | POA: Diagnosis not present

## 2014-12-28 DIAGNOSIS — K219 Gastro-esophageal reflux disease without esophagitis: Secondary | ICD-10-CM

## 2014-12-28 DIAGNOSIS — Z23 Encounter for immunization: Secondary | ICD-10-CM | POA: Diagnosis not present

## 2014-12-28 DIAGNOSIS — IMO0001 Reserved for inherently not codable concepts without codable children: Secondary | ICD-10-CM

## 2014-12-28 DIAGNOSIS — F988 Other specified behavioral and emotional disorders with onset usually occurring in childhood and adolescence: Secondary | ICD-10-CM

## 2014-12-28 DIAGNOSIS — L508 Other urticaria: Secondary | ICD-10-CM

## 2014-12-28 DIAGNOSIS — F32A Depression, unspecified: Secondary | ICD-10-CM

## 2014-12-28 DIAGNOSIS — E669 Obesity, unspecified: Secondary | ICD-10-CM

## 2014-12-28 DIAGNOSIS — I10 Essential (primary) hypertension: Secondary | ICD-10-CM

## 2014-12-28 MED ORDER — SUCRALFATE 1 G PO TABS: 1.0000 g | ORAL_TABLET | Freq: Three times a day (TID) | ORAL | Status: DC

## 2014-12-28 MED ORDER — AMPHETAMINE-DEXTROAMPHETAMINE 20 MG PO TABS: 20.0000 mg | ORAL_TABLET | Freq: Two times a day (BID) | ORAL | Status: DC

## 2014-12-28 MED ORDER — HYDROCODONE-ACETAMINOPHEN 10-325 MG PO TABS: 1.0000 | ORAL_TABLET | Freq: Four times a day (QID) | ORAL | Status: DC | PRN

## 2014-12-28 MED ORDER — ALPRAZOLAM 0.25 MG PO TABS: 0.2500 mg | ORAL_TABLET | Freq: Three times a day (TID) | ORAL | Status: DC | PRN

## 2014-12-28 MED ORDER — TOPIRAMATE 25 MG PO TABS: 25.0000 mg | ORAL_TABLET | Freq: Every day | ORAL | Status: DC

## 2014-12-28 MED ORDER — SCOPOLAMINE 1 MG/3DAYS TD PT72: 1.0000 | MEDICATED_PATCH | TRANSDERMAL | Status: DC

## 2014-12-28 MED ORDER — RANITIDINE HCL 150 MG PO TABS: 150.0000 mg | ORAL_TABLET | Freq: Two times a day (BID) | ORAL | Status: DC

## 2014-12-28 MED ORDER — PHENTERMINE HCL 15 MG PO CAPS: 15.0000 mg | ORAL_CAPSULE | ORAL | Status: DC

## 2014-12-28 NOTE — Patient Instructions (Signed)
DASH Eating Plan  DASH stands for "Dietary Approaches to Stop Hypertension." The DASH eating plan is a healthy eating plan that has been shown to reduce high blood pressure (hypertension). Additional health benefits may include reducing the risk of type 2 diabetes mellitus, heart disease, and stroke. The DASH eating plan may also help with weight loss.  WHAT DO I NEED TO KNOW ABOUT THE DASH EATING PLAN?  For the DASH eating plan, you will follow these general guidelines:  · Choose foods with a percent daily value for sodium of less than 5% (as listed on the food label).  · Use salt-free seasonings or herbs instead of table salt or sea salt.  · Check with your health care provider or pharmacist before using salt substitutes.  · Eat lower-sodium products, often labeled as "lower sodium" or "no salt added."  · Eat fresh foods.  · Eat more vegetables, fruits, and low-fat dairy products.  · Choose whole grains. Look for the word "whole" as the first word in the ingredient list.  · Choose fish and skinless chicken or turkey more often than red meat. Limit fish, poultry, and meat to 6 oz (170 g) each day.  · Limit sweets, desserts, sugars, and sugary drinks.  · Choose heart-healthy fats.  · Limit cheese to 1 oz (28 g) per day.  · Eat more home-cooked food and less restaurant, buffet, and fast food.  · Limit fried foods.  · Cook foods using methods other than frying.  · Limit canned vegetables. If you do use them, rinse them well to decrease the sodium.  · When eating at a restaurant, ask that your food be prepared with less salt, or no salt if possible.  WHAT FOODS CAN I EAT?  Seek help from a dietitian for individual calorie needs.  Grains  Whole grain or whole wheat bread. Brown rice. Whole grain or whole wheat pasta. Quinoa, bulgur, and whole grain cereals. Low-sodium cereals. Corn or whole wheat flour tortillas. Whole grain cornbread. Whole grain crackers. Low-sodium crackers.  Vegetables  Fresh or frozen vegetables  (raw, steamed, roasted, or grilled). Low-sodium or reduced-sodium tomato and vegetable juices. Low-sodium or reduced-sodium tomato sauce and paste. Low-sodium or reduced-sodium canned vegetables.   Fruits  All fresh, canned (in natural juice), or frozen fruits.  Meat and Other Protein Products  Ground beef (85% or leaner), grass-fed beef, or beef trimmed of fat. Skinless chicken or turkey. Ground chicken or turkey. Pork trimmed of fat. All fish and seafood. Eggs. Dried beans, peas, or lentils. Unsalted nuts and seeds. Unsalted canned beans.  Dairy  Low-fat dairy products, such as skim or 1% milk, 2% or reduced-fat cheeses, low-fat ricotta or cottage cheese, or plain low-fat yogurt. Low-sodium or reduced-sodium cheeses.  Fats and Oils  Tub margarines without trans fats. Light or reduced-fat mayonnaise and salad dressings (reduced sodium). Avocado. Safflower, olive, or canola oils. Natural peanut or almond butter.  Other  Unsalted popcorn and pretzels.  The items listed above may not be a complete list of recommended foods or beverages. Contact your dietitian for more options.  WHAT FOODS ARE NOT RECOMMENDED?  Grains  White bread. White pasta. White rice. Refined cornbread. Bagels and croissants. Crackers that contain trans fat.  Vegetables  Creamed or fried vegetables. Vegetables in a cheese sauce. Regular canned vegetables. Regular canned tomato sauce and paste. Regular tomato and vegetable juices.  Fruits  Dried fruits. Canned fruit in light or heavy syrup. Fruit juice.  Meat and Other Protein   Products  Fatty cuts of meat. Ribs, chicken wings, bacon, sausage, bologna, salami, chitterlings, fatback, hot dogs, bratwurst, and packaged luncheon meats. Salted nuts and seeds. Canned beans with salt.  Dairy  Whole or 2% milk, cream, half-and-half, and cream cheese. Whole-fat or sweetened yogurt. Full-fat cheeses or blue cheese. Nondairy creamers and whipped toppings. Processed cheese, cheese spreads, or cheese  curds.  Condiments  Onion and garlic salt, seasoned salt, table salt, and sea salt. Canned and packaged gravies. Worcestershire sauce. Tartar sauce. Barbecue sauce. Teriyaki sauce. Soy sauce, including reduced sodium. Steak sauce. Fish sauce. Oyster sauce. Cocktail sauce. Horseradish. Ketchup and mustard. Meat flavorings and tenderizers. Bouillon cubes. Hot sauce. Tabasco sauce. Marinades. Taco seasonings. Relishes.  Fats and Oils  Butter, stick margarine, lard, shortening, ghee, and bacon fat. Coconut, palm kernel, or palm oils. Regular salad dressings.  Other  Pickles and olives. Salted popcorn and pretzels.  The items listed above may not be a complete list of foods and beverages to avoid. Contact your dietitian for more information.  WHERE CAN I FIND MORE INFORMATION?  National Heart, [employer], [employer] Institute: www.nhlbi.nih.gov/health/health-topics/topics/dash/     This information is not intended to replace advice given to you by your health care provider. Make sure you discuss any questions you have with your health care provider.     Document Released: 12/21/2010 Document Revised: 01/22/2014 Document Reviewed: 11/05/2012  Elsevier Interactive Patient Education ©2016 [employer]

## 2015-01-02 ENCOUNTER — Encounter: Payer: Self-pay | Admitting: Family Medicine

## 2015-01-02 NOTE — Assessment & Plan Note (Signed)
Avoid offending foods, take probiotics. Do not eat large meals in late evening and consider raising head of bed.  

## 2015-01-02 NOTE — Assessment & Plan Note (Signed)
Encouraged DASH diet, decrease po intake and increase exercise as tolerated. Needs 7-8 hours of sleep nightly. Avoid trans fats, eat small, frequent meals every 4-5 hours with lean proteins, complex carbs and healthy fats. Minimize simple carbs 

## 2015-01-02 NOTE — Assessment & Plan Note (Signed)
Mild, Increase leafy greens, consider increased lean red meat and using cast iron cookware. Continue to monitor, report any concerns 

## 2015-01-02 NOTE — Progress Notes (Signed)
Subjective:    Patient ID: Joan Robinson, female    DOB: [birth year], 54 y.o.   MRN: [id number]  Chief Complaint  Patient presents with  . Follow-up    HPI Patient is in today for follow-up. She has noted a good improvement in her chronic urticaria with Sand immune treatment but she is done with those this week so she is apprehensive that her rash will return. She continues to struggle with her bipolar disorder and anxiety but finds it manageable on her current medications. No suicidal ideation or extreme irritability are noted. She denies polyuria or polydipsia. She denies recent illness or fevers. She endorses fatigue and malaise. Denies CP/palp/SOB/HA/congestion/fevers/GI or GU c/o. Taking meds as prescribed  Past Medical History  Diagnosis Date  . Allergic rhinitis   . Adenomatous colon polyp   . Diverticulosis   . Colitis, Clostridium difficile 5/11,6/11  . Hypothyroidism   . Urinary incontinence   . PUD (peptic ulcer disease)   . Tobacco abuse   . Hypertension   . Hyperlipidemia   . Colon polyp     benign  . Anemia 10/10/2011  . Preventative health care 10/10/2011  . Sinusitis acute 10/10/2011  . Chicken pox as a child    X 2  . Mumps as a child  . Lower back pain   . 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 Staph aureus infection 12/18/2011    Recurrent lesions in nares  . Perimenopausal 01/16/2012  . Anxiety and depression 01/17/2007    Qualifier: Diagnosis of  By: Everardo All MD, Cleophas Dunker   . 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 Right knee pain 05/10/2012  . BCC (basal cell carcinoma of skin) 06/01/2012    Leg Follows with Dr Margo Aye  . Headache(784.0) 08/17/2012  . Bipolar disorder (HCC) 01/17/2007    Qualifier: Diagnosis of  By: Everardo All MD, Cleophas Dunker   . 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 Arthritis 07/24/2013    Likely inflammatory and following with Dr Maryln Gottron of [facility]  rheumatology  . Diverticulosis   . Hypothyroidism 08/24/2006    Qualifier: Diagnosis of  By: Everardo All MD, Sean A    .  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 Obesity 11/01/2013    Past Surgical History  Procedure Laterality Date  . Tubal ligation  1995  . Uterine suspension      mesh  . Colonoscopy    . Gated spect wall motion stress cardiolite  11/05/2001  . Polypectomy    . Abdominal hysterectomy    . Abdominal hysterectomy  2011    complete  . Dilation and curettage of uterus  1985  . Vitrectomy Bilateral 03/03/14  . Eye surgery  03/03/14    Surgery on right eye for epiretinal membrane (vitreous peel)     Family History  Problem Relation Age of Onset  . Heart disease Mother     stents  . Stroke Mother   . Hyperlipidemia Mother   . Hypertension Mother   . Other Mother     blood disorder- mgus  . Colon polyps Father   . Other Father     aorta disection  . Aneurysm Father   . Colon cancer Paternal Uncle   . Irritable bowel syndrome Daughter   . Other Daughter     gastritis  . Mental illness Daughter     bipolar and mood disorder  . Hypertension Sister     ?  Marland Kitchen Mental illness Son     bipolar  . Arthritis  Sister   . Other Sister     thyroid  . Other Sister     thyroid    Social History   Social History  . Marital Status: Married    Spouse Name: N/A  . Number of Children: 2  . Years of Education: N/A   Occupational History  . 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 Smoking status: Former Smoker -- 1.00 packs/day for 30 years    Types: Cigarettes    Quit date: 05/15/2009  . Smokeless tobacco: Never Used  . Alcohol Use: No  . Drug Use: No  . Sexual Activity: No   Other Topics Concern  . Not on file   Social History Narrative    Outpatient Prescriptions Prior to Visit  Medication Sig Dispense Refill  . Cholecalciferol (VITAMIN D3) 2000 UNITS TABS Take by mouth.    . cycloSPORINE modified (NEORAL) 50 MG capsule Take 50 mg by mouth 2 (two) times daily.    . hydrocortisone (ANUSOL-HC) 25 MG suppository Place 1 suppository (25 mg total) rectally at  bedtime. (Patient taking differently: Place 25 mg rectally 3 times/day as needed-between meals & bedtime. ) 12 suppository 0  . hydrocortisone-pramoxine (ANALPRAM HC) 2.5-1 % rectal cream Place 1 application rectally 2 (two) times daily. For 10 days 30 g 1  . hydrOXYzine (ATARAX/VISTARIL) 25 MG tablet Take 25 mg by mouth at bedtime as needed.    Marland Kitchen levothyroxine (SYNTHROID, LEVOTHROID) 100 MCG tablet Take 1 tablet (100 mcg total) by mouth daily before breakfast. 30 tablet 6  . losartan (COZAAR) 25 MG tablet Take 1 tablet (25 mg total) by mouth daily. 30 tablet 5  . metoprolol tartrate (LOPRESSOR) 25 MG tablet Take 0.5 tablets (12.5 mg total) by mouth 2 (two) times daily. 90 tablet 1  . nitroGLYCERIN (NITROSTAT) 0.4 MG SL tablet Place 0.4 mg under the tongue every 5 (five) minutes as needed.      . ondansetron (ZOFRAN) 4 MG tablet Take 1 tablet (4 mg total) by mouth every 4 (four) hours as needed for nausea or vomiting. 30 tablet 1  . SUMAtriptan (IMITREX) 50 MG tablet Take 1 tablet (50 mg total) by mouth once. May repeat in 2 hours if headache persists or recurs. 10 tablet 0  . venlafaxine XR (EFFEXOR XR) 75 MG 24 hr capsule Take 1 capsule (75 mg total) by mouth daily with breakfast. D/C previous 37.5 mg dose of med 30 capsule 5  . vitamin A 8000 UNIT capsule Take 8,000 Units by mouth daily.    Marland Kitchen ALPRAZolam (XANAX) 0.25 MG tablet Take 1 tablet (0.25 mg total) by mouth 3 (three) times daily as needed for sleep or anxiety. 30 tablet 3  . amoxicillin-clavulanate (AUGMENTIN) 875-125 MG tablet Take 1 tablet by mouth 2 (two) times daily. 14 tablet 0  . amphetamine-dextroamphetamine (ADDERALL) 20 MG tablet Take 1 tablet (20 mg total) by mouth 2 (two) times daily. September 2016 rx 60 tablet 0  . HYDROcodone-acetaminophen (NORCO) 10-325 MG tablet Take 1 tablet by mouth every 6 (six) hours as needed. 70 tablet 0  . ranitidine (ZANTAC) 150 MG tablet Take 1 tablet (150 mg total) by mouth 2 (two) times daily. 180  tablet 1  . sucralfate (CARAFATE) 1 G tablet Take 1 tablet (1 g total) by mouth 4 (four) times daily -  with meals and at bedtime. 120 tablet 3   No facility-administered medications prior to visit.    Allergies  Allergen Reactions  .  Keflex [Cephalexin] Diarrhea  . Aspirin     REACTION: difficulty breathing  . Dicyclomine Hcl     REACTION: mouth ulcers  . Metronidazole     REACTION: hives, mouth ulcers  . Moxifloxacin     REACTION: increased heart rate, nausea  . Nsaids     REACTION: difficulty breathing  . Phenergan [Promethazine Hcl]     "knocks" pt out for about 3 days  . Quetiapine     Somnolence. Slept for 36 hours straight.  . Sulfamethoxazole-Trimethoprim     REACTION: increased heart rate, nausea  . Cymbalta [Duloxetine Hcl] Rash    Review of Systems  Constitutional: Positive for malaise/fatigue. Negative for fever and chills.  HENT: Negative for congestion and hearing loss.   Eyes: Negative for discharge.  Respiratory: Negative for cough, sputum production and shortness of breath.   Cardiovascular: Negative for chest pain, palpitations and leg swelling.  Gastrointestinal: Negative for heartburn, nausea, vomiting, abdominal pain, diarrhea, constipation and blood in stool.  Genitourinary: Negative for dysuria, urgency, frequency and hematuria.  Musculoskeletal: Negative for myalgias, back pain and falls.  Skin: Negative for rash.  Neurological: Negative for dizziness, sensory change, loss of consciousness, weakness and headaches.  Endo/Heme/Allergies: Negative for environmental allergies. Does not bruise/bleed easily.  Psychiatric/Behavioral: Negative for depression and suicidal ideas. The patient is nervous/anxious. The patient does not have insomnia.        Objective:    Physical Exam  Constitutional: She is oriented to person, place, and time. She appears well-developed and well-nourished. No distress.  HENT:  Head: Normocephalic and atraumatic.  Eyes:  Conjunctivae are normal.  Neck: Neck supple. No thyromegaly present.  Cardiovascular: Normal rate, regular rhythm and normal heart sounds.   No murmur heard. Pulmonary/Chest: Effort normal and breath sounds normal. No respiratory distress.  Abdominal: Soft. Bowel sounds are normal. She exhibits no distension and no mass. There is no tenderness.  Musculoskeletal: She exhibits no edema.  Lymphadenopathy:    She has no cervical adenopathy.  Neurological: She is alert and oriented to person, place, and time.  Skin: Skin is warm and dry.  Psychiatric: She has a normal mood and affect. Her behavior is normal.    BP 136/80 mmHg  Pulse 75  Temp(Src) 98.6 F (37 C) (Oral)  Ht 5\' 7"  (1.702 m)  Wt 205 lb 2 oz (93.044 kg)  BMI 32.12 kg/m2  SpO2 98% Wt Readings from Last 3 Encounters:  12/28/14 205 lb 2 oz (93.044 kg)  11/23/14 203 lb (92.08 kg)  10/07/14 200 lb (90.719 kg)     Lab Results  Component Value Date   WBC 5.6 10/07/2014   HGB 11.2* 10/07/2014   HCT 35.6* 10/07/2014   PLT 252.0 09/28/2014   GLUCOSE 100* 09/28/2014   CHOL 211* 04/30/2014   TRIG 224.0* 04/30/2014   HDL 44.30 04/30/2014   LDLDIRECT 131.0 04/30/2014   ALT 19 09/28/2014   AST 18 09/28/2014   NA 138 09/28/2014   K 4.0 09/28/2014   CL 101 09/28/2014   CREATININE 0.94 09/28/2014   BUN 16 09/28/2014   CO2 27 09/28/2014   TSH 0.83 09/28/2014   INR 1.0 09/02/2008    Lab Results  Component Value Date   TSH 0.83 09/28/2014   Lab Results  Component Value Date   WBC 5.6 10/07/2014   HGB 11.2* 10/07/2014   HCT 35.6* 10/07/2014   MCV 86.4 10/07/2014   PLT 252.0 09/28/2014   Lab Results  Component Value Date  NA 138 09/28/2014   K 4.0 09/28/2014   CO2 27 09/28/2014   GLUCOSE 100* 09/28/2014   BUN 16 09/28/2014   CREATININE 0.94 09/28/2014   BILITOT 0.5 09/28/2014   ALKPHOS 124* 09/28/2014   AST 18 09/28/2014   ALT 19 09/28/2014   PROT 7.7 09/28/2014   ALBUMIN 4.6 09/28/2014   CALCIUM 9.5  09/28/2014   GFR 66.01 09/28/2014   Lab Results  Component Value Date   CHOL 211* 04/30/2014   Lab Results  Component Value Date   HDL 44.30 04/30/2014   No results found for: [facility] Lab Results  Component Value Date   TRIG 224.0* 04/30/2014   Lab Results  Component Value Date   CHOLHDL 5 04/30/2014   No results found for: HGBA1C     Assessment & Plan:   Problem List Items Addressed This Visit    ADD (attention deficit disorder)   Relevant Medications   amphetamine-dextroamphetamine (ADDERALL) 20 MG tablet   Anemia    Mild, Increase leafy greens, consider increased lean red meat and using cast iron cookware. Continue to monitor, report any concerns      Dyspepsia - Primary   Relevant Medications   sucralfate (CARAFATE) 1 G tablet   Essential hypertension    Well controlled, no changes to meds. Encouraged heart healthy diet such as the DASH diet and exercise as tolerated.       GERD (Chronic)    Avoid offending foods, take probiotics. Do not eat large meals in late evening and consider raising head of bed.       Relevant Medications   sucralfate (CARAFATE) 1 G tablet   ranitidine (ZANTAC) 150 MG tablet   scopolamine (TRANSDERM-SCOP, 1.5 MG,) 1 MG/3DAYS   Hyperlipidemia, mixed    Encouraged heart healthy diet, increase exercise, avoid trans fats, consider a krill oil cap daily      Lower back pain   Relevant Medications   HYDROcodone-acetaminophen (NORCO) 10-325 MG tablet   Obesity    Encouraged DASH diet, decrease po intake and increase exercise as tolerated. Needs 7-8 hours of sleep nightly. Avoid trans fats, eat small, frequent meals every 4-5 hours with lean proteins, complex carbs and healthy fats. Minimize simple carbs      Relevant Medications   amphetamine-dextroamphetamine (ADDERALL) 20 MG tablet   phentermine 15 MG capsule   Urticaria, chronic    Resolved with course of Sand Immune but last dose is tomorrow so hopefully symptoms will not return.  Continues to work with [facility] Einar Pheasant at [facility]       Other Visit Diagnoses    Reflux        Relevant Medications    ranitidine (ZANTAC) 150 MG tablet    Depression        Relevant Medications    ALPRAZolam (XANAX) 0.25 MG tablet    Need for diphtheria-tetanus-pertussis (Tdap) vaccine, adult/adolescent        Relevant Orders    Tdap vaccine greater than or equal to 7yo IM (Completed)       I have discontinued Ms. Moudy's amoxicillin-clavulanate. I am also having her start on phentermine, topiramate, and scopolamine. Additionally, I am having her maintain her nitroGLYCERIN, ondansetron, hydrOXYzine, SUMAtriptan, vitamin A, Vitamin D3, hydrocortisone, hydrocortisone-pramoxine, venlafaxine XR, metoprolol tartrate, losartan, cycloSPORINE modified, levothyroxine, sucralfate, ranitidine, ALPRAZolam, HYDROcodone-acetaminophen, and amphetamine-dextroamphetamine.  Meds ordered this encounter  Medications  . sucralfate (CARAFATE) 1 G tablet    Sig: Take 1 tablet (1 g total) by mouth 4 (four)  times daily -  with meals and at bedtime.    Dispense:  120 tablet    Refill:  3    DISCONTINUE ALL PREVIOUS REFILLS FOR THIS MEDICATION  . ranitidine (ZANTAC) 150 MG tablet    Sig: Take 1 tablet (150 mg total) by mouth 2 (two) times daily.    Dispense:  180 tablet    Refill:  1    DISCONTINUE ALL PREVIOUS REFILLS FOR THIS MEDICATION  . ALPRAZolam (XANAX) 0.25 MG tablet    Sig: Take 1 tablet (0.25 mg total) by mouth 3 (three) times daily as needed for sleep or anxiety.    Dispense:  30 tablet    Refill:  3    DISCONTINUE ALL PREVIOUS REFILLS FOR THIS MEDICATION  . HYDROcodone-acetaminophen (NORCO) 10-325 MG tablet    Sig: Take 1 tablet by mouth every 6 (six) hours as needed.    Dispense:  70 tablet    Refill:  0  . amphetamine-dextroamphetamine (ADDERALL) 20 MG tablet    Sig: Take 1 tablet (20 mg total) by mouth 2 (two) times daily. September 2016 rx    Dispense:  60 tablet    Refill:  0  .  phentermine 15 MG capsule    Sig: Take 1 capsule (15 mg total) by mouth every morning.    Dispense:  30 capsule    Refill:  1  . topiramate (TOPAMAX) 25 MG tablet    Sig: Take 1 tablet (25 mg total) by mouth daily.    Dispense:  30 tablet    Refill:  1  . scopolamine (TRANSDERM-SCOP, 1.5 MG,) 1 MG/3DAYS    Sig: Place 1 patch (1.5 mg total) onto the skin every 3 (three) days.    Dispense:  3 patch    Refill:  0     Danise Edge, MD

## 2015-01-02 NOTE — Assessment & Plan Note (Signed)
Encouraged heart healthy diet, increase exercise, avoid trans fats, consider a krill oil cap daily 

## 2015-01-02 NOTE — Assessment & Plan Note (Signed)
Well controlled, no changes to meds. Encouraged heart healthy diet such as the DASH diet and exercise as tolerated.  °

## 2015-01-02 NOTE — Assessment & Plan Note (Signed)
Resolved with course of Sand Immune but last dose is tomorrow so hopefully symptoms will not return. Continues to work with Dr Harrietta Guardian at [facility]

## 2015-01-21 MED FILL — HYDROCODON-APAP 10-325: 10-325 | 18 days supply | Qty: 70 | Fill #0

## 2015-01-21 MED FILL — AMPHETAMINE SALTS 20 MG TAB: 20 | 30 days supply | Qty: 60 | Fill #0

## 2015-01-28 ENCOUNTER — Ambulatory Visit
Admission: RE | Admit: 2015-01-28 | Discharge: 2015-01-28 | Disposition: A | Discharge status: Home / Self Care | Payer: Managed Care, Other (non HMO) | Source: Ambulatory Visit | Attending: Family Medicine | Admitting: Family Medicine

## 2015-01-28 ENCOUNTER — Ambulatory Visit
Admission: RE | Admit: 2015-01-28 | Discharge: 2015-01-28 | Disposition: A | Discharge status: Home / Self Care | Payer: Managed Care, Other (non HMO) | Source: Ambulatory Visit

## 2015-01-28 DIAGNOSIS — Z1231 Encounter for screening mammogram for malignant neoplasm of breast: Secondary | ICD-10-CM

## 2015-01-28 DIAGNOSIS — Z780 Asymptomatic menopausal state: Secondary | ICD-10-CM

## 2015-03-01 ENCOUNTER — Ambulatory Visit (INDEPENDENT_AMBULATORY_CARE_PROVIDER_SITE_OTHER): Payer: Managed Care, Other (non HMO) | Admitting: Family Medicine

## 2015-03-01 ENCOUNTER — Encounter: Payer: Self-pay | Admitting: Family Medicine

## 2015-03-01 VITALS — BP 122/80 | HR 67 | Temp 98.1°F | Ht 67.0 in | Wt 203.4 lb

## 2015-03-01 DIAGNOSIS — E782 Mixed hyperlipidemia: Secondary | ICD-10-CM | POA: Diagnosis not present

## 2015-03-01 DIAGNOSIS — K219 Gastro-esophageal reflux disease without esophagitis: Secondary | ICD-10-CM

## 2015-03-01 DIAGNOSIS — F909 Attention-deficit hyperactivity disorder, unspecified type: Secondary | ICD-10-CM | POA: Diagnosis not present

## 2015-03-01 DIAGNOSIS — I10 Essential (primary) hypertension: Secondary | ICD-10-CM

## 2015-03-01 DIAGNOSIS — L508 Other urticaria: Secondary | ICD-10-CM

## 2015-03-01 DIAGNOSIS — F418 Other specified anxiety disorders: Secondary | ICD-10-CM

## 2015-03-01 DIAGNOSIS — E039 Hypothyroidism, unspecified: Secondary | ICD-10-CM

## 2015-03-01 DIAGNOSIS — F988 Other specified behavioral and emotional disorders with onset usually occurring in childhood and adolescence: Secondary | ICD-10-CM

## 2015-03-01 DIAGNOSIS — G43809 Other migraine, not intractable, without status migrainosus: Secondary | ICD-10-CM

## 2015-03-01 DIAGNOSIS — F32A Depression, unspecified: Secondary | ICD-10-CM

## 2015-03-01 DIAGNOSIS — M9270 Juvenile osteochondrosis of metatarsus, unspecified foot: Secondary | ICD-10-CM

## 2015-03-01 DIAGNOSIS — D649 Anemia, unspecified: Secondary | ICD-10-CM

## 2015-03-01 DIAGNOSIS — M5441 Lumbago with sciatica, right side: Secondary | ICD-10-CM | POA: Diagnosis not present

## 2015-03-01 DIAGNOSIS — F329 Major depressive disorder, single episode, unspecified: Secondary | ICD-10-CM

## 2015-03-01 MED ORDER — NITROGLYCERIN 0.4 MG SL SUBL: 0.4000 mg | SUBLINGUAL_TABLET | SUBLINGUAL | Status: DC | PRN

## 2015-03-01 MED ORDER — LOSARTAN POTASSIUM 25 MG PO TABS: 25.0000 mg | ORAL_TABLET | Freq: Every day | ORAL | Status: DC

## 2015-03-01 MED ORDER — SUMATRIPTAN SUCCINATE 50 MG PO TABS: 50.0000 mg | ORAL_TABLET | Freq: Once | ORAL | Status: DC

## 2015-03-01 MED ORDER — AMPHETAMINE-DEXTROAMPHETAMINE 10 MG PO TABS: 10.0000 mg | ORAL_TABLET | Freq: Two times a day (BID) | ORAL | Status: DC

## 2015-03-01 MED ORDER — HYDROCODONE-ACETAMINOPHEN 10-325 MG PO TABS: 1.0000 | ORAL_TABLET | Freq: Four times a day (QID) | ORAL | Status: DC | PRN

## 2015-03-01 MED ORDER — LEVOTHYROXINE SODIUM 100 MCG PO TABS: 100.0000 ug | ORAL_TABLET | Freq: Every day | ORAL | Status: DC

## 2015-03-01 MED ORDER — METOPROLOL TARTRATE 25 MG PO TABS: 12.5000 mg | ORAL_TABLET | Freq: Two times a day (BID) | ORAL | Status: DC

## 2015-03-01 MED ORDER — AMPHETAMINE-DEXTROAMPHETAMINE 20 MG PO TABS: 20.0000 mg | ORAL_TABLET | Freq: Two times a day (BID) | ORAL | Status: DC

## 2015-03-01 MED ORDER — PHENTERMINE HCL 15 MG PO CAPS: 15.0000 mg | ORAL_CAPSULE | ORAL | Status: DC

## 2015-03-01 MED ORDER — ONDANSETRON HCL 4 MG PO TABS: 4.0000 mg | ORAL_TABLET | ORAL | Status: DC | PRN

## 2015-03-01 MED ORDER — TOPIRAMATE 25 MG PO TABS: 25.0000 mg | ORAL_TABLET | Freq: Every day | ORAL | Status: DC

## 2015-03-01 MED ORDER — ALPRAZOLAM 0.25 MG PO TABS
0.2500 mg | ORAL_TABLET | Freq: Three times a day (TID) | ORAL | Status: DC | PRN
Start: 2015-03-01 — End: 2015-05-31

## 2015-03-01 MED ORDER — VENLAFAXINE HCL ER 75 MG PO CP24: 75.0000 mg | ORAL_CAPSULE | Freq: Two times a day (BID) | ORAL | Status: DC

## 2015-03-01 NOTE — Patient Instructions (Signed)
Food Choices for Gastroesophageal Reflux Disease, Adult  When you have gastroesophageal reflux disease (GERD), the foods you eat and your eating habits are very important. Choosing the right foods can help ease your discomfort.   WHAT GUIDELINES DO I NEED TO FOLLOW?   · Choose fruits, vegetables, whole grains, and low-fat dairy products.    · Choose low-fat meat, fish, and poultry.  · Limit fats such as oils, salad dressings, butter, nuts, and avocado.    · Keep a food diary. This helps you identify foods that cause symptoms.    · Avoid foods that cause symptoms. These may be different for everyone.    · Eat small meals often instead of 3 large meals a day.    · Eat your meals slowly, in a place where you are relaxed.    · Limit fried foods.    · Cook foods using methods other than frying.    · Avoid drinking alcohol.    · Avoid drinking large amounts of liquids with your meals.    · Avoid bending over or lying down until 2-3 hours after eating.    WHAT FOODS ARE NOT RECOMMENDED?   These are some foods and drinks that may make your symptoms worse:  Vegetables  Tomatoes. Tomato juice. Tomato and spaghetti sauce. Chili peppers. Onion and garlic. Horseradish.  Fruits  Oranges, grapefruit, and lemon (fruit and juice).  Meats  High-fat meats, fish, and poultry. This includes hot dogs, ribs, ham, sausage, salami, and bacon.  Dairy  Whole milk and chocolate milk. Sour cream. Cream. Butter. Ice cream. Cream cheese.   Drinks  Coffee and tea. Bubbly (carbonated) drinks or energy drinks.  Condiments  Hot sauce. Barbecue sauce.   Sweets/Desserts  Chocolate and cocoa. Donuts. Peppermint and spearmint.  Fats and Oils  High-fat foods. This includes French fries and potato chips.  Other  Vinegar. Strong spices. This includes black pepper, white pepper, red pepper, cayenne, curry powder, cloves, ginger, and chili powder.  The items listed above may not be a complete list of foods and drinks to avoid. Contact your dietitian for more  information.     This information is not intended to replace advice given to you by your health care provider. Make sure you discuss any questions you have with your health care provider.     Document Released: 07/03/2011 Document Revised: 01/22/2014 Document Reviewed: 11/05/2012  Elsevier Interactive Patient Education ©2016 [employer]

## 2015-03-01 NOTE — Progress Notes (Signed)
Pre visit review using our clinic review tool, if applicable. No additional management support is needed unless otherwise documented below in the visit note. 

## 2015-03-06 ENCOUNTER — Encounter: Payer: Self-pay | Admitting: Family Medicine

## 2015-03-06 NOTE — Assessment & Plan Note (Signed)
Avoid offending foods, start probiotics. Do not eat large meals in late evening and consider raising head of bed.  

## 2015-03-06 NOTE — Assessment & Plan Note (Signed)
On Levothyroxine, continue to monitor 

## 2015-03-06 NOTE — Assessment & Plan Note (Signed)
Doing well with course of treatment

## 2015-03-06 NOTE — Assessment & Plan Note (Signed)
Encouraged heart healthy diet, increase exercise, avoid trans fats, consider a krill oil cap daily 

## 2015-03-06 NOTE — Assessment & Plan Note (Signed)
Feels she is doing well at the present time. No changes

## 2015-03-06 NOTE — Assessment & Plan Note (Signed)
Well controlled, no changes to meds. Encouraged heart healthy diet such as the DASH diet and exercise as tolerated.  °

## 2015-03-06 NOTE — Progress Notes (Signed)
Patient ID: Joan Robinson, female   DOB: 1960/07/02, 55 y.o.   MRN: [id number]   Subjective:    Patient ID: Joan Robinson, female    DOB: May 08, 1960, 55 y.o.   MRN: [id number]  Chief Complaint  Patient presents with  . Follow-up    HPI Patient is in today for follow up. Her urticaria has improved significantly and she has been titrating down meds with resurgence of hives. No recent illness or new concerns. Continues to be under a great deal of stress at home and at work. No suicidal ideation but acknowledges anhedonia. Denies CP/palp/SOB/HA/congestion/fevers/GI or GU c/o. Taking meds as prescribed  Past Medical History  Diagnosis Date  . Allergic rhinitis   . Adenomatous colon polyp   . Diverticulosis   . Colitis, Clostridium difficile 5/11,6/11  . Hypothyroidism   . Urinary incontinence   . PUD (peptic ulcer disease)   . Tobacco abuse   . Hypertension   . Hyperlipidemia   . Colon polyp     benign  . Anemia 10/10/2011  . Preventative health care 10/10/2011  . Sinusitis acute 10/10/2011  . Chicken pox as a child    X 2  . Mumps as a child  . Lower back pain   . 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 Staph aureus infection 12/18/2011    Recurrent lesions in nares  . Perimenopausal 01/16/2012  . Anxiety and depression 01/17/2007    Qualifier: Diagnosis of  By: Everardo All MD, Cleophas Dunker   . 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 Right knee pain 05/10/2012  . BCC (basal cell carcinoma of skin) 06/01/2012    Leg Follows with Dr Margo Aye  . Headache(784.0) 08/17/2012  . Bipolar disorder (HCC) 01/17/2007    Qualifier: Diagnosis of  By: Everardo All MD, Cleophas Dunker   . 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 Arthritis 07/24/2013    Likely inflammatory and following with Dr Maryln Gottron of [facility]  rheumatology  . Diverticulosis   . Hypothyroidism 08/24/2006    Qualifier: Diagnosis of  By: Everardo All MD, Sean A    . 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 Obesity 11/01/2013    Past Surgical History  Procedure Laterality Date  .  Tubal ligation  1995  . Uterine suspension      mesh  . Colonoscopy    . Gated spect wall motion stress cardiolite  11/05/2001  . Polypectomy    . Abdominal hysterectomy    . Abdominal hysterectomy  2011    complete  . Dilation and curettage of uterus  1985  . Vitrectomy Bilateral 03/03/14  . Eye surgery  03/03/14    Surgery on right eye for epiretinal membrane (vitreous peel)     Family History  Problem Relation Age of Onset  . Heart disease Mother     stents  . Stroke Mother   . Hyperlipidemia Mother   . Hypertension Mother   . Other Mother     blood disorder- mgus  . Colon polyps Father   . Other Father     aorta disection  . Aneurysm Father   . Colon cancer Paternal Uncle   . Irritable bowel syndrome Daughter   . Other Daughter     gastritis  . Mental illness Daughter     bipolar and mood disorder  . Hypertension Sister     ?  Marland Kitchen Mental illness Son     bipolar  . Arthritis Sister   . Other Sister     thyroid  .  Other Sister     thyroid    Social History   Social History  . Marital Status: Married    Spouse Name: N/A  . Number of Children: 2  . Years of Education: N/A   Occupational History  . 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 Smoking status: Former Smoker -- 1.00 packs/day for 30 years    Types: Cigarettes    Quit date: 05/15/2009  . Smokeless tobacco: Never Used  . Alcohol Use: No  . Drug Use: No  . Sexual Activity: No   Other Topics Concern  . Not on file   Social History Narrative    Outpatient Prescriptions Prior to Visit  Medication Sig Dispense Refill  . hydrocortisone-pramoxine (ANALPRAM HC) 2.5-1 % rectal cream Place 1 application rectally 2 (two) times daily. For 10 days 30 g 1  . hydrOXYzine (ATARAX/VISTARIL) 25 MG tablet Take 25 mg by mouth at bedtime as needed.    . ranitidine (ZANTAC) 150 MG tablet Take 1 tablet (150 mg total) by mouth 2 (two) times daily. 180 tablet 1  . sucralfate (CARAFATE) 1 G tablet Take  1 tablet (1 g total) by mouth 4 (four) times daily -  with meals and at bedtime. 120 tablet 3  . vitamin A 8000 UNIT capsule Take 8,000 Units by mouth daily.    Marland Kitchen levothyroxine (SYNTHROID, LEVOTHROID) 100 MCG tablet Take 1 tablet (100 mcg total) by mouth daily before breakfast. 30 tablet 6  . losartan (COZAAR) 25 MG tablet Take 1 tablet (25 mg total) by mouth daily. 30 tablet 5  . metoprolol tartrate (LOPRESSOR) 25 MG tablet Take 0.5 tablets (12.5 mg total) by mouth 2 (two) times daily. 90 tablet 1  . nitroGLYCERIN (NITROSTAT) 0.4 MG SL tablet Place 0.4 mg under the tongue every 5 (five) minutes as needed.      . ondansetron (ZOFRAN) 4 MG tablet Take 1 tablet (4 mg total) by mouth every 4 (four) hours as needed for nausea or vomiting. 30 tablet 1  . phentermine 15 MG capsule Take 1 capsule (15 mg total) by mouth every morning. 30 capsule 1  . SUMAtriptan (IMITREX) 50 MG tablet Take 1 tablet (50 mg total) by mouth once. May repeat in 2 hours if headache persists or recurs. 10 tablet 0  . topiramate (TOPAMAX) 25 MG tablet Take 1 tablet (25 mg total) by mouth daily. 30 tablet 1  . venlafaxine XR (EFFEXOR XR) 75 MG 24 hr capsule Take 1 capsule (75 mg total) by mouth daily with breakfast. D/C previous 37.5 mg dose of med 30 capsule 5  . Cholecalciferol (VITAMIN D3) 2000 UNITS TABS Take by mouth.    . hydrocortisone (ANUSOL-HC) 25 MG suppository Place 1 suppository (25 mg total) rectally at bedtime. (Patient taking differently: Place 25 mg rectally 3 times/day as needed-between meals & bedtime. ) 12 suppository 0  . scopolamine (TRANSDERM-SCOP, 1.5 MG,) 1 MG/3DAYS Place 1 patch (1.5 mg total) onto the skin every 3 (three) days. (Patient not taking: Reported on 03/01/2015) 3 patch 0  . ALPRAZolam (XANAX) 0.25 MG tablet Take 1 tablet (0.25 mg total) by mouth 3 (three) times daily as needed for sleep or anxiety. 30 tablet 3  . amphetamine-dextroamphetamine (ADDERALL) 20 MG tablet Take 1 tablet (20 mg total) by  mouth 2 (two) times daily. September 2016 rx 60 tablet 0  . cycloSPORINE modified (NEORAL) 50 MG capsule Take 50 mg by mouth 2 (two) times daily.    Marland Kitchen  HYDROcodone-acetaminophen (NORCO) 10-325 MG tablet Take 1 tablet by mouth every 6 (six) hours as needed. 70 tablet 0   No facility-administered medications prior to visit.    Allergies  Allergen Reactions  . Keflex [Cephalexin] Diarrhea  . Aspirin     REACTION: difficulty breathing  . Dicyclomine Hcl     REACTION: mouth ulcers  . Metronidazole     REACTION: hives, mouth ulcers  . Moxifloxacin     REACTION: increased heart rate, nausea  . Nsaids     REACTION: difficulty breathing  . Phenergan [Promethazine Hcl]     "knocks" pt out for about 3 days  . Quetiapine     Somnolence. Slept for 36 hours straight.  . Sulfamethoxazole-Trimethoprim     REACTION: increased heart rate, nausea  . Cymbalta [Duloxetine Hcl] Rash    Review of Systems  Constitutional: Negative for fever, chills and malaise/fatigue.  HENT: Negative for congestion and hearing loss.   Eyes: Negative for discharge.  Respiratory: Negative for cough, sputum production and shortness of breath.   Cardiovascular: Negative for chest pain, palpitations and leg swelling.  Gastrointestinal: Positive for heartburn. Negative for nausea, vomiting, abdominal pain, diarrhea, constipation and blood in stool.  Genitourinary: Negative for dysuria, urgency, frequency and hematuria.  Musculoskeletal: Negative for myalgias, back pain and falls.  Skin: Negative for rash.  Neurological: Negative for dizziness, sensory change, loss of consciousness, weakness and headaches.  Endo/Heme/Allergies: Negative for environmental allergies. Does not bruise/bleed easily.  Psychiatric/Behavioral: Positive for depression. Negative for suicidal ideas. The patient is not nervous/anxious and does not have insomnia.        Objective:    Physical Exam  Constitutional: She is oriented to person,  place, and time. She appears well-developed and well-nourished. No distress.  HENT:  Head: Normocephalic and atraumatic.  Nose: Nose normal.  Eyes: Right eye exhibits no discharge. Left eye exhibits no discharge.  Neck: Normal range of motion. Neck supple.  Cardiovascular: Normal rate and regular rhythm.   No murmur heard. Pulmonary/Chest: Effort normal and breath sounds normal.  Abdominal: Soft. Bowel sounds are normal. There is no tenderness.  Musculoskeletal: She exhibits no edema.  Neurological: She is alert and oriented to person, place, and time.  Skin: Skin is warm and dry.  Psychiatric: She has a normal mood and affect.  Nursing note and vitals reviewed.   BP 122/80 mmHg  Pulse 67  Temp(Src) 98.1 F (36.7 C) (Oral)  Ht 5\' 7"  (1.702 m)  Wt 203 lb 6 oz (92.25 kg)  BMI 31.85 kg/m2  SpO2 96% Wt Readings from Last 3 Encounters:  03/01/15 203 lb 6 oz (92.25 kg)  12/28/14 205 lb 2 oz (93.044 kg)  11/23/14 203 lb (92.08 kg)     Lab Results  Component Value Date   WBC 5.6 10/07/2014   HGB 11.2* 10/07/2014   HCT 35.6* 10/07/2014   PLT 252.0 09/28/2014   GLUCOSE 100* 09/28/2014   CHOL 211* 04/30/2014   TRIG 224.0* 04/30/2014   HDL 44.30 04/30/2014   LDLDIRECT 131.0 04/30/2014   ALT 19 09/28/2014   AST 18 09/28/2014   NA 138 09/28/2014   K 4.0 09/28/2014   CL 101 09/28/2014   CREATININE 0.94 09/28/2014   BUN 16 09/28/2014   CO2 27 09/28/2014   TSH 0.83 09/28/2014   INR 1.0 09/02/2008    Lab Results  Component Value Date   TSH 0.83 09/28/2014   Lab Results  Component Value Date   WBC 5.6 10/07/2014  HGB 11.2* 10/07/2014   HCT 35.6* 10/07/2014   MCV 86.4 10/07/2014   PLT 252.0 09/28/2014   Lab Results  Component Value Date   NA 138 09/28/2014   K 4.0 09/28/2014   CO2 27 09/28/2014   GLUCOSE 100* 09/28/2014   BUN 16 09/28/2014   CREATININE 0.94 09/28/2014   BILITOT 0.5 09/28/2014   ALKPHOS 124* 09/28/2014   AST 18 09/28/2014   ALT 19 09/28/2014     PROT 7.7 09/28/2014   ALBUMIN 4.6 09/28/2014   CALCIUM 9.5 09/28/2014   GFR 66.01 09/28/2014   Lab Results  Component Value Date   CHOL 211* 04/30/2014   Lab Results  Component Value Date   HDL 44.30 04/30/2014   No results found for: [facility] Lab Results  Component Value Date   TRIG 224.0* 04/30/2014   Lab Results  Component Value Date   CHOLHDL 5 04/30/2014   No results found for: HGBA1C     Assessment & Plan:   Problem List Items Addressed This Visit    ADD (attention deficit disorder)   Relevant Orders   CBC   TSH   Comprehensive metabolic panel   Lipid panel   Anemia   Relevant Orders   CBC   TSH   Comprehensive metabolic panel   Lipid panel   Depression with anxiety    Feels she is doing well at the present time. No changes      Relevant Orders   CBC   TSH   Comprehensive metabolic panel   Lipid panel   Essential hypertension    Well controlled, no changes to meds. Encouraged heart healthy diet such as the DASH diet and exercise as tolerated.       Relevant Medications   losartan (COZAAR) 25 MG tablet   metoprolol tartrate (LOPRESSOR) 25 MG tablet   nitroGLYCERIN (NITROSTAT) 0.4 MG SL tablet   Other Relevant Orders   CBC   TSH   Comprehensive metabolic panel   Lipid panel   Freiberg's disease   Relevant Orders   CBC   TSH   Comprehensive metabolic panel   Lipid panel   GERD (Chronic)    Avoid offending foods, start probiotics. Do not eat large meals in late evening and consider raising head of bed.       Relevant Medications   ondansetron (ZOFRAN) 4 MG tablet   Hyperlipidemia, mixed    Encouraged heart healthy diet, increase exercise, avoid trans fats, consider a krill oil cap daily      Relevant Medications   losartan (COZAAR) 25 MG tablet   metoprolol tartrate (LOPRESSOR) 25 MG tablet   nitroGLYCERIN (NITROSTAT) 0.4 MG SL tablet   Other Relevant Orders   CBC   TSH   Comprehensive metabolic panel   Lipid panel    Hypothyroidism    On Levothyroxine, continue to monitor      Relevant Medications   levothyroxine (SYNTHROID, LEVOTHROID) 100 MCG tablet   metoprolol tartrate (LOPRESSOR) 25 MG tablet   Other Relevant Orders   CBC   TSH   Comprehensive metabolic panel   Lipid panel   Lower back pain - Primary   Relevant Medications   HYDROcodone-acetaminophen (NORCO) 10-325 MG tablet   Other Relevant Orders   CBC   TSH   Comprehensive metabolic panel   Lipid panel   Other type of migraine without status migrainosus   Relevant Medications   venlafaxine XR (EFFEXOR XR) 75 MG 24 hr capsule   HYDROcodone-acetaminophen (NORCO)  10-325 MG tablet   losartan (COZAAR) 25 MG tablet   metoprolol tartrate (LOPRESSOR) 25 MG tablet   SUMAtriptan (IMITREX) 50 MG tablet   topiramate (TOPAMAX) 25 MG tablet   nitroGLYCERIN (NITROSTAT) 0.4 MG SL tablet   Urticaria, chronic    Doing well with course of treatment       Other Visit Diagnoses    Depression        Relevant Medications    venlafaxine XR (EFFEXOR XR) 75 MG 24 hr capsule    ALPRAZolam (XANAX) 0.25 MG tablet       I have discontinued Ms. Pennick's nitroGLYCERIN, cycloSPORINE modified, amphetamine-dextroamphetamine, and amphetamine-dextroamphetamine. I have also changed her venlafaxine XR. Additionally, I am having her start on nitroGLYCERIN, amphetamine-dextroamphetamine, and amphetamine-dextroamphetamine. Lastly, I am having her maintain her hydrOXYzine, vitamin A, Vitamin D3, hydrocortisone, hydrocortisone-pramoxine, sucralfate, ranitidine, scopolamine, ALPRAZolam, HYDROcodone-acetaminophen, levothyroxine, losartan, metoprolol tartrate, ondansetron, phentermine, SUMAtriptan, and topiramate.  Meds ordered this encounter  Medications  . DISCONTD: phentermine 15 MG capsule    Sig: Take 1 capsule (15 mg total) by mouth every morning.    Dispense:  30 capsule    Refill:  1  . DISCONTD: HYDROcodone-acetaminophen (NORCO) 10-325 MG tablet    Sig: Take  1 tablet by mouth every 6 (six) hours as needed.    Dispense:  70 tablet    Refill:  0  . DISCONTD: amphetamine-dextroamphetamine (ADDERALL) 20 MG tablet    Sig: Take 1 tablet (20 mg total) by mouth 2 (two) times daily. September 2016 rx    Dispense:  60 tablet    Refill:  0  . DISCONTD: topiramate (TOPAMAX) 25 MG tablet    Sig: Take 1 tablet (25 mg total) by mouth daily.    Dispense:  30 tablet    Refill:  1  . venlafaxine XR (EFFEXOR XR) 75 MG 24 hr capsule    Sig: Take 1 capsule (75 mg total) by mouth 2 (two) times daily.    Dispense:  60 capsule    Refill:  5  . ALPRAZolam (XANAX) 0.25 MG tablet    Sig: Take 1 tablet (0.25 mg total) by mouth 3 (three) times daily as needed for sleep or anxiety.    Dispense:  30 tablet    Refill:  3    DISCONTINUE ALL PREVIOUS REFILLS FOR THIS MEDICATION  . HYDROcodone-acetaminophen (NORCO) 10-325 MG tablet    Sig: Take 1 tablet by mouth every 6 (six) hours as needed.    Dispense:  70 tablet    Refill:  0  . levothyroxine (SYNTHROID, LEVOTHROID) 100 MCG tablet    Sig: Take 1 tablet (100 mcg total) by mouth daily before breakfast.    Dispense:  30 tablet    Refill:  6    DISCONTINUE ALL PREVIOUS REFILLS FOR THIS MEDICATION  . losartan (COZAAR) 25 MG tablet    Sig: Take 1 tablet (25 mg total) by mouth daily.    Dispense:  30 tablet    Refill:  5  . metoprolol tartrate (LOPRESSOR) 25 MG tablet    Sig: Take 0.5 tablets (12.5 mg total) by mouth 2 (two) times daily.    Dispense:  90 tablet    Refill:  1    DISCONTINUE ALL PREVIOUS REFILLS FOR THIS MEDICATION  . ondansetron (ZOFRAN) 4 MG tablet    Sig: Take 1 tablet (4 mg total) by mouth every 4 (four) hours as needed for nausea or vomiting.    Dispense:  30 tablet  Refill:  1  . phentermine 15 MG capsule    Sig: Take 1 capsule (15 mg total) by mouth every morning.    Dispense:  30 capsule    Refill:  1  . SUMAtriptan (IMITREX) 50 MG tablet    Sig: Take 1 tablet (50 mg total) by mouth  once. May repeat in 2 hours if headache persists or recurs.    Dispense:  10 tablet    Refill:  0    DISCONTINUE ALL PREVIOUS REFILLS FOR THIS MEDICATION  . topiramate (TOPAMAX) 25 MG tablet    Sig: Take 1 tablet (25 mg total) by mouth daily.    Dispense:  30 tablet    Refill:  5  . nitroGLYCERIN (NITROSTAT) 0.4 MG SL tablet    Sig: Place 1 tablet (0.4 mg total) under the tongue every 5 (five) minutes as needed for chest pain (max of 3 tabs, to ER if pain persists).    Dispense:  25 tablet    Refill:  0  . amphetamine-dextroamphetamine (ADDERALL) 10 MG tablet    Sig: Take 1 tablet (10 mg total) by mouth 2 (two) times daily. February 2017    Dispense:  60 tablet    Refill:  0  . amphetamine-dextroamphetamine (ADDERALL) 10 MG tablet    Sig: Take 1 tablet (10 mg total) by mouth 2 (two) times daily. March 2017    Dispense:  60 tablet    Refill:  0     Danise Edge, MD

## 2015-03-21 MED FILL — AMPHETAMINE SALTS 10 MG TAB: 10 | 30 days supply | Qty: 60 | Fill #0

## 2015-03-21 MED FILL — HYDROCODON-APAP 10-325: 10-325 | 17 days supply | Qty: 70 | Fill #0

## 2015-03-31 ENCOUNTER — Encounter: Payer: Self-pay | Admitting: Internal Medicine

## 2015-04-08 ENCOUNTER — Other Ambulatory Visit: Payer: Managed Care, Other (non HMO)

## 2015-04-08 LAB — CBC
HCT: 34.9 % — ABNORMAL LOW (ref 36.0–46.0)
HEMOGLOBIN: 11.8 g/dL — AB (ref 12.0–15.0)
MCHC: 33.7 g/dL (ref 30.0–36.0)
MCV: 86.5 fl (ref 78.0–100.0)
PLATELETS: 249 10*3/uL (ref 150.0–400.0)
RBC: 4.04 Mil/uL (ref 3.87–5.11)
RDW: 13.7 % (ref 11.5–15.5)
WBC: 5.4 10*3/uL (ref 4.0–10.5)

## 2015-04-08 LAB — LIPID PANEL
CHOL/HDL RATIO: 4
CHOLESTEROL: 182 mg/dL (ref 0–200)
HDL: 41.4 mg/dL (ref >39.00)
NonHDL: 140.77
TRIGLYCERIDES: 218 mg/dL — AB (ref 0.0–149.0)
VLDL: 43.6 mg/dL — AB (ref 0.0–40.0)

## 2015-04-08 LAB — COMPREHENSIVE METABOLIC PANEL
ALBUMIN: 4.4 g/dL (ref 3.5–5.2)
ALT: 17 U/L (ref 0–35)
AST: 19 U/L (ref 0–37)
Alkaline Phosphatase: 88 U/L (ref 39–117)
BUN: 16 mg/dL (ref 6–23)
CALCIUM: 9.2 mg/dL (ref 8.4–10.5)
CHLORIDE: 104 meq/L (ref 96–112)
CO2: 28 mEq/L (ref 19–32)
Creatinine, Ser: 0.85 mg/dL (ref 0.40–1.20)
GFR: 73.99 mL/min (ref >60.00)
Glucose, Bld: 93 mg/dL (ref 70–99)
POTASSIUM: 4 meq/L (ref 3.5–5.1)
SODIUM: 138 meq/L (ref 135–145)
Total Bilirubin: 0.3 mg/dL (ref 0.2–1.2)
Total Protein: 7.2 g/dL (ref 6.0–8.3)

## 2015-04-08 LAB — TSH: TSH: 0.73 u[IU]/mL (ref 0.35–4.50)

## 2015-04-08 LAB — LDL CHOLESTEROL, DIRECT: Direct LDL: 104 mg/dL

## 2015-04-08 NOTE — Addendum Note (Signed)
Addended by: Peggyann Shoals on: 04/08/2015 12:12 PM   Modules accepted: Orders

## 2015-05-13 ENCOUNTER — Other Ambulatory Visit: Payer: Self-pay | Admitting: Family Medicine

## 2015-05-13 DIAGNOSIS — M5441 Lumbago with sciatica, right side: Secondary | ICD-10-CM

## 2015-05-13 MED ORDER — HYDROCODONE-ACETAMINOPHEN 10-325 MG PO TABS: 1.0000 | ORAL_TABLET | Freq: Four times a day (QID) | ORAL | Status: DC | PRN

## 2015-05-13 MED FILL — HYDROCODON-APAP 10-325: 10-325 | 18 days supply | Qty: 70 | Fill #0

## 2015-05-13 NOTE — Telephone Encounter (Signed)
Requesting: Hydrocodone Contract  10/27/2013 UDS  Moderate Last OV   03/01/2015 Last Refill   03/01/2015  #[id number] with 0 refills.  Please Advise

## 2015-05-13 NOTE — Telephone Encounter (Signed)
Pt called in to follow up on her medication refill request. She says that she would like to pick up today if possible.    CB- [phone] when ready for pick up

## 2015-05-13 NOTE — Telephone Encounter (Signed)
Called the patient informed to pickup hardcopy at the front desk. 

## 2015-05-19 ENCOUNTER — Ambulatory Visit (INDEPENDENT_AMBULATORY_CARE_PROVIDER_SITE_OTHER): Payer: Managed Care, Other (non HMO) | Admitting: Medical

## 2015-05-19 ENCOUNTER — Encounter: Payer: Self-pay | Admitting: Medical

## 2015-05-19 VITALS — BP 120/78 | HR 87 | Temp 98.1°F | Ht 67.0 in | Wt 197.6 lb

## 2015-05-19 DIAGNOSIS — J0100 Acute maxillary sinusitis, unspecified: Secondary | ICD-10-CM | POA: Diagnosis not present

## 2015-05-19 DIAGNOSIS — R059 Cough, unspecified: Secondary | ICD-10-CM

## 2015-05-19 DIAGNOSIS — J309 Allergic rhinitis, unspecified: Secondary | ICD-10-CM

## 2015-05-19 DIAGNOSIS — R05 Cough: Secondary | ICD-10-CM | POA: Diagnosis not present

## 2015-05-19 DIAGNOSIS — J209 Acute bronchitis, unspecified: Secondary | ICD-10-CM | POA: Diagnosis not present

## 2015-05-19 MED ORDER — HYDROCODONE-HOMATROPINE 5-1.5 MG/5ML PO SYRP: 5.0000 mL | ORAL_SOLUTION | Freq: Three times a day (TID) | ORAL | Status: DC | PRN

## 2015-05-19 MED ORDER — AZITHROMYCIN 250 MG PO TABS: ORAL_TABLET | ORAL | Status: DC

## 2015-05-19 MED ORDER — ALBUTEROL SULFATE HFA 108 (90 BASE) MCG/ACT IN AERS: 2.0000 | INHALATION_SPRAY | Freq: Four times a day (QID) | RESPIRATORY_TRACT | Status: DC | PRN

## 2015-05-19 NOTE — Progress Notes (Signed)
Pre visit review using our clinic review tool, if applicable. No additional management support is needed unless otherwise documented below in the visit note. 

## 2015-05-19 NOTE — Progress Notes (Signed)
Subjective:    Patient ID: Joan Robinson, female    DOB: Apr 17, 1960, 55 y.o.   MRN: [id number]  HPI  Joan Robinson in for cough and congestion. Sunday seemed have gotten worse.  Joan Robinson states preceding month nasal congestion and runny nose. Joan Robinson is on zytrec, vitamin c and zinc.   On Sunday has had deep productive cough with sinus pressure. Joan Robinson is former smoker.   Chills early today. Some ear pain. Faint st.  Joan Robinson does not like nasal sprays.  Cough keeping her up at night.   Review of Systems  Constitutional: Positive for chills and fatigue. Negative for fever.  HENT: Positive for congestion, postnasal drip, sneezing and sore throat. Negative for ear pain.   Respiratory: Positive for cough. Negative for shortness of breath and wheezing.   Cardiovascular: Negative for chest pain and palpitations.  Musculoskeletal: Negative for back pain.  Neurological: Negative for dizziness and headaches.  Hematological: Negative for adenopathy. Does not bruise/bleed easily.  Psychiatric/Behavioral: Negative for behavioral problems and confusion.   Past Medical History  Diagnosis Date  . Allergic rhinitis   . Adenomatous colon polyp   . Diverticulosis   . Colitis, Clostridium difficile 5/11,6/11  . Hypothyroidism   . Urinary incontinence   . PUD (peptic ulcer disease)   . Tobacco abuse   . Hypertension   . Hyperlipidemia   . Colon polyp     benign  . Anemia 10/10/2011  . Preventative health care 10/10/2011  . Sinusitis acute 10/10/2011  . Chicken pox as a child    X 2  . Mumps as a child  . Lower back pain   . 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 Staph aureus infection 12/18/2011    Recurrent lesions in nares  . Perimenopausal 01/16/2012  . Anxiety and depression 01/17/2007    Qualifier: Diagnosis of  By: Everardo All MD, Cleophas Dunker   . 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 Right knee pain 05/10/2012  . BCC (basal cell carcinoma of skin) 06/01/2012    Leg Follows with Dr Margo Aye  . Headache(784.0) 08/17/2012  .  Bipolar disorder (HCC) 01/17/2007    Qualifier: Diagnosis of  By: Everardo All MD, Cleophas Dunker   . 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 Arthritis 07/24/2013    Likely inflammatory and following with Dr Maryln Gottron of [facility]  rheumatology  . Diverticulosis   . Hypothyroidism 08/24/2006    Qualifier: Diagnosis of  By: Everardo All MD, Sean A    . 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 Obesity 11/01/2013     Social History   Social History  . Marital Status: Married    Spouse Name: N/A  . Number of Children: 2  . Years of Education: N/A   Occupational History  . 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 Smoking status: Former Smoker -- 1.00 packs/day for 30 years    Types: Cigarettes    Quit date: 05/15/2009  . Smokeless tobacco: Never Used  . Alcohol Use: No  . Drug Use: No  . Sexual Activity: No   Other Topics Concern  . Not on file   Social History Narrative    Past Surgical History  Procedure Laterality Date  . Tubal ligation  1995  . Uterine suspension      mesh  . Colonoscopy    . Gated spect wall motion stress cardiolite  11/05/2001  . Polypectomy    . Abdominal hysterectomy    . Abdominal hysterectomy  2011  complete  . Dilation and curettage of uterus  1985  . Vitrectomy Bilateral 03/03/14  . Eye surgery  03/03/14    Surgery on right eye for epiretinal membrane (vitreous peel)     Family History  Problem Relation Age of Onset  . Heart disease Mother     stents  . Stroke Mother   . Hyperlipidemia Mother   . Hypertension Mother   . Other Mother     blood disorder- mgus  . Colon polyps Father   . Other Father     aorta disection  . Aneurysm Father   . Colon cancer Paternal Uncle   . Irritable bowel syndrome Daughter   . Other Daughter     gastritis  . Mental illness Daughter     bipolar and mood disorder  . Hypertension Sister     ?  Marland Kitchen Mental illness Son     bipolar  . Arthritis Sister   . Other Sister     thyroid  . Other Sister     thyroid     Allergies  Allergen Reactions  . Keflex [Cephalexin] Diarrhea  . Aspirin     REACTION: difficulty breathing  . Dicyclomine Hcl     REACTION: mouth ulcers  . Metronidazole     REACTION: hives, mouth ulcers  . Moxifloxacin     REACTION: increased heart rate, nausea  . Nsaids     REACTION: difficulty breathing  . Phenergan [Promethazine Hcl]     "knocks" Joan Robinson out for about 3 days  . Quetiapine     Somnolence. Slept for 36 hours straight.  . Sulfamethoxazole-Trimethoprim     REACTION: increased heart rate, nausea  . Cymbalta [Duloxetine Hcl] Rash    Current Outpatient Prescriptions on File Prior to Visit  Medication Sig Dispense Refill  . ALPRAZolam (XANAX) 0.25 MG tablet Take 1 tablet (0.25 mg total) by mouth 3 (three) times daily as needed for sleep or anxiety. 30 tablet 3  . amphetamine-dextroamphetamine (ADDERALL) 10 MG tablet Take 1 tablet (10 mg total) by mouth 2 (two) times daily. February 2017 60 tablet 0  . amphetamine-dextroamphetamine (ADDERALL) 10 MG tablet Take 1 tablet (10 mg total) by mouth 2 (two) times daily. March 2017 60 tablet 0  . Cholecalciferol (VITAMIN D3) 2000 UNITS TABS Take by mouth.    Marland Kitchen HYDROcodone-acetaminophen (NORCO) 10-325 MG tablet Take 1 tablet by mouth every 6 (six) hours as needed. 70 tablet 0  . hydrocortisone (ANUSOL-HC) 25 MG suppository Place 1 suppository (25 mg total) rectally at bedtime. (Patient taking differently: Place 25 mg rectally 3 times/day as needed-between meals & bedtime. ) 12 suppository 0  . hydrocortisone-pramoxine (ANALPRAM HC) 2.5-1 % rectal cream Place 1 application rectally 2 (two) times daily. For 10 days 30 g 1  . hydrOXYzine (ATARAX/VISTARIL) 25 MG tablet Take 25 mg by mouth at bedtime as needed.    Marland Kitchen levothyroxine (SYNTHROID, LEVOTHROID) 100 MCG tablet Take 1 tablet (100 mcg total) by mouth daily before breakfast. 30 tablet 6  . losartan (COZAAR) 25 MG tablet Take 1 tablet (25 mg total) by mouth daily. 30 tablet 5  .  metoprolol tartrate (LOPRESSOR) 25 MG tablet Take 0.5 tablets (12.5 mg total) by mouth 2 (two) times daily. 90 tablet 1  . nitroGLYCERIN (NITROSTAT) 0.4 MG SL tablet Place 1 tablet (0.4 mg total) under the tongue every 5 (five) minutes as needed for chest pain (max of 3 tabs, to ER if pain persists). 25 tablet 0  .  ondansetron (ZOFRAN) 4 MG tablet Take 1 tablet (4 mg total) by mouth every 4 (four) hours as needed for nausea or vomiting. 30 tablet 1  . phentermine 15 MG capsule Take 1 capsule (15 mg total) by mouth every morning. 30 capsule 1  . ranitidine (ZANTAC) 150 MG tablet Take 1 tablet (150 mg total) by mouth 2 (two) times daily. 180 tablet 1  . scopolamine (TRANSDERM-SCOP, 1.5 MG,) 1 MG/3DAYS Place 1 patch (1.5 mg total) onto the skin every 3 (three) days. 3 patch 0  . sucralfate (CARAFATE) 1 G tablet Take 1 tablet (1 g total) by mouth 4 (four) times daily -  with meals and at bedtime. 120 tablet 3  . SUMAtriptan (IMITREX) 50 MG tablet Take 1 tablet (50 mg total) by mouth once. May repeat in 2 hours if headache persists or recurs. 10 tablet 0  . topiramate (TOPAMAX) 25 MG tablet Take 1 tablet (25 mg total) by mouth daily. 30 tablet 5  . venlafaxine XR (EFFEXOR XR) 75 MG 24 hr capsule Take 1 capsule (75 mg total) by mouth 2 (two) times daily. 60 capsule 5  . vitamin A 8000 UNIT capsule Take 8,000 Units by mouth daily.     No current facility-administered medications on file prior to visit.    BP 120/78 mmHg  Pulse 87  Temp(Src) 98.1 F (36.7 C) (Oral)  Ht 5\' 7"  (1.702 m)  Wt 197 lb 9.6 oz (89.631 kg)  BMI 30.94 kg/m2  SpO2 98%       Objective:   Physical Exam   General  Mental Status - Alert. General Appearance - Well groomed. Not in acute distress.  Skin Rashes- No Rashes.  HEENT Head- Normal. Ear Auditory Canal - Left- Normal. Right - Normal.Tympanic Membrane- Left- Normal. Right- Normal. Eye Sclera/Conjunctiva- Left- Normal. Right- Normal. Nose & Sinuses Nasal  Mucosa- Left-  Boggy and Congested. Right-  Boggy and  Congested.Bilateral maxillary but no frontal sinus pressure. Mouth & Throat Lips: Upper Lip- Normal: no dryness, cracking, pallor, cyanosis, or vesicular eruption. Lower Lip-Normal: no dryness, cracking, pallor, cyanosis or vesicular eruption. Buccal Mucosa- Bilateral- No Aphthous ulcers. Oropharynx- No Discharge or Erythema. Tonsils: Characteristics- Bilateral- No Erythema or Congestion. Size/Enlargement- Bilateral- No enlargement. Discharge- bilateral-None.  Neck Neck- Supple. No Masses.   Chest and Lung Exam Auscultation: Breath Sounds:- even and unlabored. Faint upper lobe rhonchi  Cardiovascular Auscultation:Rythm- Regular, rate and rhythm. Murmurs & Other Heart Sounds:Ausculatation of the heart reveal- No Murmurs.  Lymphatic Head & Neck General Head & Neck Lymphatics: Bilateral: Description- No Localized lymphadenopathy.      Assessment & Plan:  For allergies continue zyrtec.  For bronchitis and sinusitis rx azithromycin.   For cough hycodan.  If any wheezing occurs will make albuterol available.  Follow up in 7-10 days or as needed    Jaymarion Trombly, Ramon Dredge, [employer]

## 2015-05-19 NOTE — Patient Instructions (Signed)
For allergies continue zyrtec.  For bronchitis and sinusitis rx azithromycin.   For cough hycodan.  If any wheezing occurs will make albuterol available.  Follow up in 7-10 days or as needed

## 2015-05-24 ENCOUNTER — Encounter: Payer: Self-pay | Admitting: Family Medicine

## 2015-05-31 ENCOUNTER — Ambulatory Visit (INDEPENDENT_AMBULATORY_CARE_PROVIDER_SITE_OTHER): Payer: Managed Care, Other (non HMO) | Admitting: Family Medicine

## 2015-05-31 ENCOUNTER — Encounter: Payer: Self-pay | Admitting: Family Medicine

## 2015-05-31 VITALS — BP 112/76 | HR 62 | Temp 98.0°F | Ht 66.5 in | Wt 197.2 lb

## 2015-05-31 DIAGNOSIS — E669 Obesity, unspecified: Secondary | ICD-10-CM

## 2015-05-31 DIAGNOSIS — I10 Essential (primary) hypertension: Secondary | ICD-10-CM | POA: Diagnosis not present

## 2015-05-31 DIAGNOSIS — F32A Depression, unspecified: Secondary | ICD-10-CM

## 2015-05-31 DIAGNOSIS — M5441 Lumbago with sciatica, right side: Secondary | ICD-10-CM

## 2015-05-31 DIAGNOSIS — E782 Mixed hyperlipidemia: Secondary | ICD-10-CM

## 2015-05-31 DIAGNOSIS — E039 Hypothyroidism, unspecified: Secondary | ICD-10-CM | POA: Diagnosis not present

## 2015-05-31 DIAGNOSIS — F418 Other specified anxiety disorders: Secondary | ICD-10-CM

## 2015-05-31 DIAGNOSIS — F329 Major depressive disorder, single episode, unspecified: Secondary | ICD-10-CM

## 2015-05-31 DIAGNOSIS — M1990 Unspecified osteoarthritis, unspecified site: Secondary | ICD-10-CM

## 2015-05-31 MED ORDER — AMPHETAMINE-DEXTROAMPHETAMINE 10 MG PO TABS: 10.0000 mg | ORAL_TABLET | Freq: Two times a day (BID) | ORAL | Status: DC

## 2015-05-31 MED ORDER — HYDROCODONE-ACETAMINOPHEN 10-325 MG PO TABS: 1.0000 | ORAL_TABLET | Freq: Four times a day (QID) | ORAL | Status: DC | PRN

## 2015-05-31 MED ORDER — VENLAFAXINE HCL ER 75 MG PO CP24: 75.0000 mg | ORAL_CAPSULE | Freq: Two times a day (BID) | ORAL | Status: DC

## 2015-05-31 MED ORDER — METOPROLOL TARTRATE 25 MG PO TABS: 12.5000 mg | ORAL_TABLET | Freq: Two times a day (BID) | ORAL | Status: DC

## 2015-05-31 MED ORDER — LEVOTHYROXINE SODIUM 100 MCG PO TABS: 100.0000 ug | ORAL_TABLET | Freq: Every day | ORAL | Status: DC

## 2015-05-31 MED ORDER — LOSARTAN POTASSIUM 25 MG PO TABS: 25.0000 mg | ORAL_TABLET | Freq: Every day | ORAL | Status: DC

## 2015-05-31 MED ORDER — ALPRAZOLAM 0.25 MG PO TABS
0.2500 mg | ORAL_TABLET | Freq: Three times a day (TID) | ORAL | Status: DC | PRN
Start: 2015-05-31 — End: 2015-08-30

## 2015-05-31 MED ORDER — ONDANSETRON HCL 4 MG PO TABS: 4.0000 mg | ORAL_TABLET | ORAL | Status: DC | PRN

## 2015-05-31 NOTE — Progress Notes (Signed)
Pre visit review using our clinic review tool, if applicable. No additional management support is needed unless otherwise documented below in the visit note. 

## 2015-05-31 NOTE — Progress Notes (Signed)
Subjective:    Patient ID: Joan Robinson, female    DOB: [birth year], 55 y.o.   MRN: [id number]  Chief Complaint  Patient presents with  . Follow-up    HPI Patient is in today for follow up.  Patient reports she os doing well sees Dr. Erin Sons for 2 and 3rd finger pain, Dr. Erin Sons has recommenced surgery but patient is going to hold off surgery. Patient does reports some new stressors has a 78 year grandson that she is now taking care of and has been struggling with. Denies CP/palp/SOB/HA/congestion/fevers/GI or GU c/o. Taking meds as prescribed  Past Medical History  Diagnosis Date  . Allergic rhinitis   . Adenomatous colon polyp   . Diverticulosis   . Colitis, Clostridium difficile 5/11,6/11  . Hypothyroidism   . Urinary incontinence   . PUD (peptic ulcer disease)   . Tobacco abuse   . Hypertension   . Hyperlipidemia   . Colon polyp     benign  . Anemia 10/10/2011  . Preventative health care 10/10/2011  . Sinusitis acute 10/10/2011  . Chicken pox as a child    X 2  . Mumps as a child  . Lower back pain   . 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 Staph aureus infection 12/18/2011    Recurrent lesions in nares  . Perimenopausal 01/16/2012  . Anxiety and depression 01/17/2007    Qualifier: Diagnosis of  By: Everardo All MD, Cleophas Dunker   . 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 Right knee pain 05/10/2012  . BCC (basal cell carcinoma of skin) 06/01/2012    Leg Follows with Dr Margo Aye  . Headache(784.0) 08/17/2012  . Bipolar disorder (HCC) 01/17/2007    Qualifier: Diagnosis of  By: Everardo All MD, Cleophas Dunker   . 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 Arthritis 07/24/2013    Likely inflammatory and following with Dr Maryln Gottron of [facility]  rheumatology  . Diverticulosis   . Hypothyroidism 08/24/2006    Qualifier: Diagnosis of  By: Everardo All MD, Sean A    . 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 Obesity 11/01/2013    Past Surgical History  Procedure Laterality Date  . Tubal ligation  1995  . Uterine suspension     mesh  . Colonoscopy    . Gated spect wall motion stress cardiolite  11/05/2001  . Polypectomy    . Abdominal hysterectomy    . Abdominal hysterectomy  2011    complete  . Dilation and curettage of uterus  1985  . Vitrectomy Bilateral 03/03/14  . Eye surgery  03/03/14    Surgery on right eye for epiretinal membrane (vitreous peel)     Family History  Problem Relation Age of Onset  . Heart disease Mother     stents  . Stroke Mother   . Hyperlipidemia Mother   . Hypertension Mother   . Other Mother     blood disorder- mgus  . Colon polyps Father   . Other Father     aorta disection  . Aneurysm Father   . Colon cancer Paternal Uncle   . Irritable bowel syndrome Daughter   . Other Daughter     gastritis  . Mental illness Daughter     bipolar and mood disorder  . Hypertension Sister     ?  Marland Kitchen Mental illness Son     bipolar  . Arthritis Sister   . Other Sister     thyroid  . Other Sister     thyroid  Social History   Social History  . Marital Status: Married    Spouse Name: N/A  . Number of Children: 2  . Years of Education: N/A   Occupational History  . 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 Smoking status: Former Smoker -- 1.00 packs/day for 30 years    Types: Cigarettes    Quit date: 05/15/2009  . Smokeless tobacco: Never Used  . Alcohol Use: No  . Drug Use: No  . Sexual Activity: No   Other Topics Concern  . Not on file   Social History Narrative    Outpatient Prescriptions Prior to Visit  Medication Sig Dispense Refill  . albuterol (PROVENTIL HFA;VENTOLIN HFA) 108 (90 Base) MCG/ACT inhaler Inhale 2 puffs into the lungs every 6 (six) hours as needed for wheezing or shortness of breath. 1 Inhaler 0  . Cholecalciferol (VITAMIN D3) 2000 UNITS TABS Take by mouth.    . hydrocortisone (ANUSOL-HC) 25 MG suppository Place 1 suppository (25 mg total) rectally at bedtime. (Patient taking differently: Place 25 mg rectally 3 times/day as  needed-between meals & bedtime. ) 12 suppository 0  . hydrocortisone-pramoxine (ANALPRAM HC) 2.5-1 % rectal cream Place 1 application rectally 2 (two) times daily. For 10 days 30 g 1  . hydrOXYzine (ATARAX/VISTARIL) 25 MG tablet Take 25 mg by mouth at bedtime as needed.    . nitroGLYCERIN (NITROSTAT) 0.4 MG SL tablet Place 1 tablet (0.4 mg total) under the tongue every 5 (five) minutes as needed for chest pain (max of 3 tabs, to ER if pain persists). 25 tablet 0  . ranitidine (ZANTAC) 150 MG tablet Take 1 tablet (150 mg total) by mouth 2 (two) times daily. 180 tablet 1  . scopolamine (TRANSDERM-SCOP, 1.5 MG,) 1 MG/3DAYS Place 1 patch (1.5 mg total) onto the skin every 3 (three) days. 3 patch 0  . sucralfate (CARAFATE) 1 G tablet Take 1 tablet (1 g total) by mouth 4 (four) times daily -  with meals and at bedtime. 120 tablet 3  . SUMAtriptan (IMITREX) 50 MG tablet Take 1 tablet (50 mg total) by mouth once. May repeat in 2 hours if headache persists or recurs. 10 tablet 0  . topiramate (TOPAMAX) 25 MG tablet Take 1 tablet (25 mg total) by mouth daily. 30 tablet 5  . vitamin A 8000 UNIT capsule Take 8,000 Units by mouth daily.    Marland Kitchen ALPRAZolam (XANAX) 0.25 MG tablet Take 1 tablet (0.25 mg total) by mouth 3 (three) times daily as needed for sleep or anxiety. 30 tablet 3  . amphetamine-dextroamphetamine (ADDERALL) 10 MG tablet Take 1 tablet (10 mg total) by mouth 2 (two) times daily. February 2017 60 tablet 0  . amphetamine-dextroamphetamine (ADDERALL) 10 MG tablet Take 1 tablet (10 mg total) by mouth 2 (two) times daily. March 2017 60 tablet 0  . HYDROcodone-acetaminophen (NORCO) 10-325 MG tablet Take 1 tablet by mouth every 6 (six) hours as needed. 70 tablet 0  . levothyroxine (SYNTHROID, LEVOTHROID) 100 MCG tablet Take 1 tablet (100 mcg total) by mouth daily before breakfast. 30 tablet 6  . losartan (COZAAR) 25 MG tablet Take 1 tablet (25 mg total) by mouth daily. 30 tablet 5  . metoprolol tartrate  (LOPRESSOR) 25 MG tablet Take 0.5 tablets (12.5 mg total) by mouth 2 (two) times daily. 90 tablet 1  . ondansetron (ZOFRAN) 4 MG tablet Take 1 tablet (4 mg total) by mouth every 4 (four) hours as needed for nausea or vomiting.  30 tablet 1  . phentermine 15 MG capsule Take 1 capsule (15 mg total) by mouth every morning. 30 capsule 1  . venlafaxine XR (EFFEXOR XR) 75 MG 24 hr capsule Take 1 capsule (75 mg total) by mouth 2 (two) times daily. 60 capsule 5  . azithromycin (ZITHROMAX) 250 MG tablet Take 2 tablets by mouth on day 1, followed by 1 tablet by mouth daily for 4 days. 6 tablet 0  . HYDROcodone-homatropine (HYCODAN) 5-1.5 MG/5ML syrup Take 5 mLs by mouth every 8 (eight) hours as needed for cough. 120 mL 0   No facility-administered medications prior to visit.    Allergies  Allergen Reactions  . Keflex [Cephalexin] Diarrhea  . Aspirin     REACTION: difficulty breathing  . Dicyclomine Hcl     REACTION: mouth ulcers  . Metronidazole     REACTION: hives, mouth ulcers  . Moxifloxacin     REACTION: increased heart rate, nausea  . Nsaids     REACTION: difficulty breathing  . Phenergan [Promethazine Hcl]     "knocks" pt out for about 3 days  . Quetiapine     Somnolence. Slept for 36 hours straight.  . Sulfamethoxazole-Trimethoprim     REACTION: increased heart rate, nausea  . Cymbalta [Duloxetine Hcl] Rash    Review of Systems  Constitutional: Negative for fever and malaise/fatigue.  HENT: Negative for congestion.   Eyes: Negative for blurred vision.  Respiratory: Negative for shortness of breath.   Cardiovascular: Negative for chest pain, palpitations and leg swelling.  Gastrointestinal: Negative for nausea, abdominal pain and blood in stool.  Genitourinary: Negative for dysuria and frequency.  Musculoskeletal: Negative for falls.  Skin: Negative for rash.  Neurological: Negative for dizziness, loss of consciousness and headaches.  Endo/Heme/Allergies: Negative for  environmental allergies.  Psychiatric/Behavioral: Negative for depression. The patient is nervous/anxious.        Objective:    Physical Exam  Constitutional: She is oriented to person, place, and time. She appears well-developed and well-nourished. No distress.  HENT:  Head: Normocephalic and atraumatic.  Eyes: Conjunctivae are normal.  Neck: Neck supple. No thyromegaly present.  Cardiovascular: Normal rate, regular rhythm and normal heart sounds.   No murmur heard. Pulmonary/Chest: Effort normal and breath sounds normal. No respiratory distress.  Abdominal: Soft. Bowel sounds are normal. She exhibits no distension and no mass. There is no tenderness.  Musculoskeletal: She exhibits no edema.  Lymphadenopathy:    She has no cervical adenopathy.  Neurological: She is alert and oriented to person, place, and time.  Skin: Skin is warm and dry.  Psychiatric: She has a normal mood and affect. Her behavior is normal.    BP 112/76 mmHg  Pulse 62  Temp(Src) 98 F (36.7 C) (Oral)  Ht 5' 6.5" (1.689 m)  Wt 197 lb 4 oz (89.472 kg)  BMI 31.36 kg/m2  SpO2 97% Wt Readings from Last 3 Encounters:  05/31/15 197 lb 4 oz (89.472 kg)  05/19/15 197 lb 9.6 oz (89.631 kg)  03/01/15 203 lb 6 oz (92.25 kg)     Lab Results  Component Value Date   WBC 5.4 04/08/2015   HGB 11.8* 04/08/2015   HCT 34.9* 04/08/2015   PLT 249.0 04/08/2015   GLUCOSE 93 04/08/2015   CHOL 182 04/08/2015   TRIG 218.0* 04/08/2015   HDL 41.40 04/08/2015   LDLDIRECT 104.0 04/08/2015   ALT 17 04/08/2015   AST 19 04/08/2015   NA 138 04/08/2015   K 4.0 04/08/2015  CL 104 04/08/2015   CREATININE 0.85 04/08/2015   BUN 16 04/08/2015   CO2 28 04/08/2015   TSH 0.73 04/08/2015   INR 1.0 09/02/2008    Lab Results  Component Value Date   TSH 0.73 04/08/2015   Lab Results  Component Value Date   WBC 5.4 04/08/2015   HGB 11.8* 04/08/2015   HCT 34.9* 04/08/2015   MCV 86.5 04/08/2015   PLT 249.0 04/08/2015    Lab Results  Component Value Date   NA 138 04/08/2015   K 4.0 04/08/2015   CO2 28 04/08/2015   GLUCOSE 93 04/08/2015   BUN 16 04/08/2015   CREATININE 0.85 04/08/2015   BILITOT 0.3 04/08/2015   ALKPHOS 88 04/08/2015   AST 19 04/08/2015   ALT 17 04/08/2015   PROT 7.2 04/08/2015   ALBUMIN 4.4 04/08/2015   CALCIUM 9.2 04/08/2015   GFR 73.99 04/08/2015   Lab Results  Component Value Date   CHOL 182 04/08/2015   Lab Results  Component Value Date   HDL 41.40 04/08/2015   No results found for: [facility] Lab Results  Component Value Date   TRIG 218.0* 04/08/2015   Lab Results  Component Value Date   CHOLHDL 4 04/08/2015   No results found for: HGBA1C     Assessment & Plan:   Problem List Items Addressed This Visit    Obesity    Encouraged DASH diet, decrease po intake and increase exercise as tolerated. Needs 7-8 hours of sleep nightly. Avoid trans fats, eat small, frequent meals every 4-5 hours with lean proteins, complex carbs and healthy fats. Minimize simple carbs      Relevant Medications   amphetamine-dextroamphetamine (ADDERALL) 10 MG tablet   amphetamine-dextroamphetamine (ADDERALL) 10 MG tablet   amphetamine-dextroamphetamine (ADDERALL) 10 MG tablet   Lower back pain   Relevant Medications   HYDROcodone-acetaminophen (NORCO) 10-325 MG tablet   Hypothyroidism    On Levothyroxine, continue to monitor      Relevant Medications   metoprolol tartrate (LOPRESSOR) 25 MG tablet   levothyroxine (SYNTHROID, LEVOTHROID) 100 MCG tablet   Hyperlipidemia, mixed    Encouraged heart healthy diet, increase exercise, avoid trans fats, consider a krill oil cap daily      Relevant Medications   metoprolol tartrate (LOPRESSOR) 25 MG tablet   losartan (COZAAR) 25 MG tablet   Essential hypertension - Primary   Relevant Medications   metoprolol tartrate (LOPRESSOR) 25 MG tablet   losartan (COZAAR) 25 MG tablet   Depression with anxiety    Continues to struggle with  stressors but feels she is managing adequately with her current meds      Arthritis    Has been diagnosed with Zigzag arthritis by Dr Erin Sons.       Relevant Medications   HYDROcodone-acetaminophen (NORCO) 10-325 MG tablet    Other Visit Diagnoses    Depression        Relevant Medications    venlafaxine XR (EFFEXOR XR) 75 MG 24 hr capsule    ALPRAZolam (XANAX) 0.25 MG tablet       I have discontinued Ms. Magnone's phentermine, azithromycin, and HYDROcodone-homatropine. I have also changed her amphetamine-dextroamphetamine, amphetamine-dextroamphetamine, and amphetamine-dextroamphetamine. Additionally, I am having her maintain her hydrOXYzine, vitamin A, Vitamin D3, hydrocortisone, hydrocortisone-pramoxine, sucralfate, ranitidine, scopolamine, SUMAtriptan, topiramate, nitroGLYCERIN, albuterol, venlafaxine XR, ondansetron, metoprolol tartrate, losartan, levothyroxine, HYDROcodone-acetaminophen, and ALPRAZolam.  Meds ordered this encounter  Medications  . DISCONTD: amphetamine-dextroamphetamine (ADDERALL) 10 MG tablet    Sig: Take 10 mg by mouth  2 (two) times daily.  Marland Kitchen venlafaxine XR (EFFEXOR XR) 75 MG 24 hr capsule    Sig: Take 1 capsule (75 mg total) by mouth 2 (two) times daily.    Dispense:  60 capsule    Refill:  5  . ondansetron (ZOFRAN) 4 MG tablet    Sig: Take 1 tablet (4 mg total) by mouth every 4 (four) hours as needed for nausea or vomiting.    Dispense:  30 tablet    Refill:  1  . metoprolol tartrate (LOPRESSOR) 25 MG tablet    Sig: Take 0.5 tablets (12.5 mg total) by mouth 2 (two) times daily.    Dispense:  90 tablet    Refill:  1  . losartan (COZAAR) 25 MG tablet    Sig: Take 1 tablet (25 mg total) by mouth daily.    Dispense:  30 tablet    Refill:  6  . levothyroxine (SYNTHROID, LEVOTHROID) 100 MCG tablet    Sig: Take 1 tablet (100 mcg total) by mouth daily before breakfast.    Dispense:  30 tablet    Refill:  6  . HYDROcodone-acetaminophen (NORCO) 10-325 MG  tablet    Sig: Take 1 tablet by mouth every 6 (six) hours as needed.    Dispense:  70 tablet    Refill:  0  . amphetamine-dextroamphetamine (ADDERALL) 10 MG tablet    Sig: Take 1 tablet (10 mg total) by mouth 2 (two) times daily. July  2017    Dispense:  60 tablet    Refill:  0  . amphetamine-dextroamphetamine (ADDERALL) 10 MG tablet    Sig: Take 1 tablet (10 mg total) by mouth 2 (two) times daily. June  2017    Dispense:  60 tablet    Refill:  0  . ALPRAZolam (XANAX) 0.25 MG tablet    Sig: Take 1 tablet (0.25 mg total) by mouth 3 (three) times daily as needed for sleep or anxiety.    Dispense:  30 tablet    Refill:  3  . amphetamine-dextroamphetamine (ADDERALL) 10 MG tablet    Sig: Take 1 tablet (10 mg total) by mouth 2 (two) times daily. August 2017    Dispense:  60 tablet    Refill:  0     Danise Edge, MD

## 2015-05-31 NOTE — Patient Instructions (Addendum)
DASH Eating Plan  DASH stands for "Dietary Approaches to Stop Hypertension." The DASH eating plan is a healthy eating plan that has been shown to reduce high blood pressure (hypertension). Additional health benefits may include reducing the risk of type 2 diabetes mellitus, heart disease, and stroke. The DASH eating plan may also help with weight loss.  WHAT DO I NEED TO KNOW ABOUT THE DASH EATING PLAN?  For the DASH eating plan, you will follow these general guidelines:  · Choose foods with a percent daily value for sodium of less than 5% (as listed on the food label).  · Use salt-free seasonings or herbs instead of table salt or sea salt.  · Check with your health care provider or pharmacist before using salt substitutes.  · Eat lower-sodium products, often labeled as "lower sodium" or "no salt added."  · Eat fresh foods.  · Eat more vegetables, fruits, and low-fat dairy products.  · Choose whole grains. Look for the word "whole" as the first word in the ingredient list.  · Choose fish and skinless chicken or turkey more often than red meat. Limit fish, poultry, and meat to 6 oz (170 g) each day.  · Limit sweets, desserts, sugars, and sugary drinks.  · Choose heart-healthy fats.  · Limit cheese to 1 oz (28 g) per day.  · Eat more home-cooked food and less restaurant, buffet, and fast food.  · Limit fried foods.  · Cook foods using methods other than frying.  · Limit canned vegetables. If you do use them, rinse them well to decrease the sodium.  · When eating at a restaurant, ask that your food be prepared with less salt, or no salt if possible.  WHAT FOODS CAN I EAT?  Seek help from a dietitian for individual calorie needs.  Grains  Whole grain or whole wheat bread. Brown rice. Whole grain or whole wheat pasta. Quinoa, bulgur, and whole grain cereals. Low-sodium cereals. Corn or whole wheat flour tortillas. Whole grain cornbread. Whole grain crackers. Low-sodium crackers.  Vegetables  Fresh or frozen vegetables  (raw, steamed, roasted, or grilled). Low-sodium or reduced-sodium tomato and vegetable juices. Low-sodium or reduced-sodium tomato sauce and paste. Low-sodium or reduced-sodium canned vegetables.   Fruits  All fresh, canned (in natural juice), or frozen fruits.  Meat and Other Protein Products  Ground beef (85% or leaner), grass-fed beef, or beef trimmed of fat. Skinless chicken or turkey. Ground chicken or turkey. Pork trimmed of fat. All fish and seafood. Eggs. Dried beans, peas, or lentils. Unsalted nuts and seeds. Unsalted canned beans.  Dairy  Low-fat dairy products, such as skim or 1% milk, 2% or reduced-fat cheeses, low-fat ricotta or cottage cheese, or plain low-fat yogurt. Low-sodium or reduced-sodium cheeses.  Fats and Oils  Tub margarines without trans fats. Light or reduced-fat mayonnaise and salad dressings (reduced sodium). Avocado. Safflower, olive, or canola oils. Natural peanut or almond butter.  Other  Unsalted popcorn and pretzels.  The items listed above may not be a complete list of recommended foods or beverages. Contact your dietitian for more options.  WHAT FOODS ARE NOT RECOMMENDED?  Grains  White bread. White pasta. White rice. Refined cornbread. Bagels and croissants. Crackers that contain trans fat.  Vegetables  Creamed or fried vegetables. Vegetables in a cheese sauce. Regular canned vegetables. Regular canned tomato sauce and paste. Regular tomato and vegetable juices.  Fruits  Dried fruits. Canned fruit in light or heavy syrup. Fruit juice.  Meat and Other Protein   Products  Fatty cuts of meat. Ribs, chicken wings, bacon, sausage, bologna, salami, chitterlings, fatback, hot dogs, bratwurst, and packaged luncheon meats. Salted nuts and seeds. Canned beans with salt.  Dairy  Whole or 2% milk, cream, half-and-half, and cream cheese. Whole-fat or sweetened yogurt. Full-fat cheeses or blue cheese. Nondairy creamers and whipped toppings. Processed cheese, cheese spreads, or cheese  curds.  Condiments  Onion and garlic salt, seasoned salt, table salt, and sea salt. Canned and packaged gravies. Worcestershire sauce. Tartar sauce. Barbecue sauce. Teriyaki sauce. Soy sauce, including reduced sodium. Steak sauce. Fish sauce. Oyster sauce. Cocktail sauce. Horseradish. Ketchup and mustard. Meat flavorings and tenderizers. Bouillon cubes. Hot sauce. Tabasco sauce. Marinades. Taco seasonings. Relishes.  Fats and Oils  Butter, stick margarine, lard, shortening, ghee, and bacon fat. Coconut, palm kernel, or palm oils. Regular salad dressings.  Other  Pickles and olives. Salted popcorn and pretzels.  The items listed above may not be a complete list of foods and beverages to avoid. Contact your dietitian for more information.  WHERE CAN I FIND MORE INFORMATION?  National [employer], [employer], [employer]: www.nhlbi.nih.gov/health/health-topics/topics/dash/     This information is not intended to replace advice given to you by your health care provider. Make sure you discuss any questions you have with your health care provider.     Document Released: 12/21/2010 Document Revised: 01/22/2014 Document Reviewed: 11/05/2012  Elsevier Interactive Patient Education ©2016 [employer]

## 2015-06-05 NOTE — Assessment & Plan Note (Signed)
Continues to struggle with stressors but feels she is managing adequately with her current meds

## 2015-06-05 NOTE — Assessment & Plan Note (Signed)
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

## 2015-06-05 NOTE — Assessment & Plan Note (Signed)
On Levothyroxine, continue to monitor 

## 2015-06-05 NOTE — Assessment & Plan Note (Signed)
Encouraged DASH diet, decrease po intake and increase exercise as tolerated. Needs 7-8 hours of sleep nightly. Avoid trans fats, eat small, frequent meals every 4-5 hours with lean proteins, complex carbs and healthy fats. Minimize simple carbs 

## 2015-06-05 NOTE — Assessment & Plan Note (Signed)
Encouraged heart healthy diet, increase exercise, avoid trans fats, consider a krill oil cap daily 

## 2015-06-10 MED FILL — HYDROCODON-APAP 10-325: 10-325 | 18 days supply | Qty: 70 | Fill #0

## 2015-06-23 ENCOUNTER — Telehealth: Payer: Self-pay | Admitting: Family Medicine

## 2015-06-23 NOTE — Telephone Encounter (Signed)
Have not seen form yet, please track down if possible

## 2015-06-23 NOTE — Telephone Encounter (Signed)
° °  Reason for Call: Patient called to follow up on form that was faxed yesterday 6/7 for her to donate Plasma. Wanted to know if the form was received and when will it be completed.  Can be reached at: [phone]

## 2015-06-23 NOTE — Telephone Encounter (Signed)
Forwarded to Dr. Blyth. JG//CMA  

## 2015-06-24 NOTE — Telephone Encounter (Signed)
refaxed to number below.

## 2015-06-24 NOTE — Telephone Encounter (Signed)
Faxed completed form to [phone] and patient notified form done/faxed.

## 2015-06-24 NOTE — Telephone Encounter (Signed)
Pt called back in, she says that the location that [facility] faxed to didn't receive fax. She would like to know if form could be sent to her via e-mail or fax so that she could get it to them herself.    Please advise   Fax: [phone]

## 2015-07-07 ENCOUNTER — Telehealth: Payer: Self-pay | Admitting: Family Medicine

## 2015-07-08 ENCOUNTER — Other Ambulatory Visit: Payer: Self-pay | Admitting: Family Medicine

## 2015-07-08 DIAGNOSIS — M5441 Lumbago with sciatica, right side: Secondary | ICD-10-CM

## 2015-07-08 MED ORDER — HYDROCODONE-ACETAMINOPHEN 10-325 MG PO TABS: 1.0000 | ORAL_TABLET | Freq: Four times a day (QID) | ORAL | Status: DC | PRN

## 2015-07-08 MED FILL — HYDROCODON-APAP 10-325: 10-325 | 17 days supply | Qty: 70 | Fill #0

## 2015-07-08 MED FILL — DEXTROAMP-AMP 10 MG TAB: 10 | 30 days supply | Qty: 60 | Fill #0

## 2015-07-08 NOTE — Telephone Encounter (Signed)
Pt called to check on RX refills. Please call when ready.

## 2015-07-08 NOTE — Telephone Encounter (Signed)
Patient informed prescription printed/signed/ready for pickup at the front desk.

## 2015-07-08 NOTE — Telephone Encounter (Signed)
Patient informed rx ready for pick up.

## 2015-08-22 ENCOUNTER — Ambulatory Visit (INDEPENDENT_AMBULATORY_CARE_PROVIDER_SITE_OTHER): Payer: Managed Care, Other (non HMO) | Admitting: Family Medicine

## 2015-08-22 VITALS — BP 118/80 | HR 70 | Temp 98.2°F | Resp 17 | Ht 66.5 in | Wt 198.0 lb

## 2015-08-22 DIAGNOSIS — R300 Dysuria: Secondary | ICD-10-CM

## 2015-08-22 DIAGNOSIS — N3090 Cystitis, unspecified without hematuria: Secondary | ICD-10-CM

## 2015-08-22 LAB — POCT URINALYSIS DIP (MANUAL ENTRY)
BILIRUBIN UA: NEGATIVE
BILIRUBIN UA: NEGATIVE
Glucose, UA: NEGATIVE
NITRITE UA: NEGATIVE
PH UA: 7
Protein Ur, POC: NEGATIVE
Spec Grav, UA: 1.015
Urobilinogen, UA: 0.2

## 2015-08-22 LAB — POC MICROSCOPIC URINALYSIS (UMFC): Mucus: ABSENT

## 2015-08-22 MED ORDER — NITROFURANTOIN MONOHYD MACRO 100 MG PO CAPS: 100.0000 mg | ORAL_CAPSULE | Freq: Two times a day (BID) | ORAL | 0 refills | Status: DC

## 2015-08-22 NOTE — Patient Instructions (Addendum)
Start macrobid, drink plenty of fluids. Return to the clinic or go to the nearest emergency room if any of your symptoms worsen or new symptoms occur.   Urinary Tract Infection Urinary tract infections (UTIs) can develop anywhere along your urinary tract. Your urinary tract is your body's drainage system for removing wastes and extra water. Your urinary tract includes two kidneys, two ureters, a bladder, and a urethra. Your kidneys are a pair of bean-shaped organs. Each kidney is about the size of your fist. They are located below your ribs, one on each side of your spine. CAUSES Infections are caused by microbes, which are microscopic organisms, including fungi, viruses, and bacteria. These organisms are so small that they can only be seen through a microscope. Bacteria are the microbes that most commonly cause UTIs. SYMPTOMS  Symptoms of UTIs may vary by age and gender of the patient and by the location of the infection. Symptoms in young women typically include a frequent and intense urge to urinate and a painful, burning feeling in the bladder or urethra during urination. Older women and men are more likely to be tired, shaky, and weak and have muscle aches and abdominal pain. A fever may mean the infection is in your kidneys. Other symptoms of a kidney infection include pain in your back or sides below the ribs, nausea, and vomiting. DIAGNOSIS To diagnose a UTI, your caregiver will ask you about your symptoms. Your caregiver will also ask you to provide a urine sample. The urine sample will be tested for bacteria and white blood cells. White blood cells are made by your body to help fight infection. TREATMENT  Typically, UTIs can be treated with medication. Because most UTIs are caused by a bacterial infection, they usually can be treated with the use of antibiotics. The choice of antibiotic and length of treatment depend on your symptoms and the type of bacteria causing your infection. HOME CARE  INSTRUCTIONS  If you were prescribed antibiotics, take them exactly as your caregiver instructs you. Finish the medication even if you feel better after you have only taken some of the medication.  Drink enough water and fluids to keep your urine clear or pale yellow.  Avoid caffeine, tea, and carbonated beverages. They tend to irritate your bladder.  Empty your bladder often. Avoid holding urine for long periods of time.  Empty your bladder before and after sexual intercourse.  After a bowel movement, women should cleanse from front to back. Use each tissue only once. SEEK MEDICAL CARE IF:   You have back pain.  You develop a fever.  Your symptoms do not begin to resolve within 3 days. SEEK IMMEDIATE MEDICAL CARE IF:   You have severe back pain or lower abdominal pain.  You develop chills.  You have nausea or vomiting.  You have continued burning or discomfort with urination. MAKE SURE YOU:   Understand these instructions.  Will watch your condition.  Will get help right away if you are not doing well or get worse.   This information is not intended to replace advice given to you by your health care provider. Make sure you discuss any questions you have with your health care provider.   Document Released: 10/11/2004 Document Revised: 09/22/2014 Document Reviewed: 02/09/2011 Elsevier Interactive Patient Education 2016 [employer].     IF you received an x-ray today, you will receive an invoice from [facility]. Please contact [facility] at [phone] with questions or concerns regarding your invoice.  IF you received labwork today, you will receive an invoice from [employer]. Please contact [facility] at [phone] with questions or concerns regarding your invoice.   Our billing staff will not be able to assist you with questions regarding bills from these companies.  You will be contacted with the lab results as  soon as they are available. The fastest way to get your results is to activate your My Chart account. Instructions are located on the last page of this paperwork. If you have not heard from [place] regarding the results in 2 weeks, please contact this office.

## 2015-08-22 NOTE — Progress Notes (Signed)
Subjective:  By signing my name below, I, Stann Ore, attest that this documentation has been prepared under the direction and in the presence of Meredith Staggers, MD. Electronically Signed: Stann Ore, Scribe. 08/22/2015 , 1:04 PM .  Patient was seen in Room 2 .   Patient ID: Joan Robinson, female    DOB: Aug 25, 1960, 55 y.o.   MRN: [id number] Chief Complaint  Patient presents with   Dysuria    Started last week    HPI Joan Robinson is a 55 y.o. female H/o multiple medical problems. Here for possible UTI.   Patient noticed dysuria starting last week. She took cranberry pills and Azo for relief about 2 days, but the dysuria recurred last night. She tried taking cranberry pills and Azo again last night but burning worsened today. She denies drinking soda recently. She denies recent antibiotic use. She denies hematuria, nausea, or vomiting. She denies any new changes with her back pain, or changes with her diarrhea.   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   Arthritis 07/24/2013   Depression with anxiety 03/08/2013   Paresthesia 12/22/2012   Headache(784.0) 08/17/2012   BCC (basal cell carcinoma of skin) 06/01/2012   Left arm pain 05/10/2012   Right knee pain 05/10/2012   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   ADD (attention deficit disorder) 04/13/2012   Perimenopausal 01/16/2012   Anemia 10/10/2011   Preventative health care 10/10/2011   Urticaria, chronic 10/10/2011   Macular pucker, bilateral 10/10/2011   Lower back pain     DIVERTICULITIS-COLON 07/25/2009   Hyperlipidemia, mixed 06/09/2009   Essential hypertension 06/09/2009   HELICOBACTER PYLORI GASTRITIS, HX OF 09/06/2008   GERD 07/27/2008   PERSONAL HX COLONIC POLYPS 07/27/2008   Bipolar disorder (HCC) 01/17/2007   Hypothyroidism 08/24/2006   ALLERGIC RHINITIS 08/24/2006   URINARY INCONTINENCE 08/24/2006   Past Medical History:  Diagnosis Date   Adenomatous colon polyp    Allergic rhinitis    Anemia 10/10/2011   Anxiety and depression 01/17/2007   Qualifier: Diagnosis of  By: Everardo All MD, Sean A    Arthritis 07/24/2013   Likely inflammatory and following with Dr Maryln Gottron of [facility]ology   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   BCC (basal cell carcinoma of skin) 06/01/2012   Leg Follows with Dr Margo Aye   Bipolar disorder (HCC) 01/17/2007   Qualifier: Diagnosis of  By: Everardo All MD, Sean A    Chicken pox as a child   X 2   Colitis, Clostridium difficile 5/11,6/11   Colon polyp    benign   Diverticulosis    Diverticulosis    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   Headache(784.0) 08/17/2012   Hyperlipidemia    Hypertension    Hypothyroidism    Hypothyroidism 08/24/2006   Qualifier: Diagnosis of  By: Everardo All MD, Sean A     Lower back pain    Macular pucker, bilateral 10/10/2011   Mumps as a child   Obesity 11/01/2013   Perimenopausal 01/16/2012   Preventative health care 10/10/2011   PUD (peptic ulcer disease)  Right knee pain 05/10/2012   Sinusitis acute 10/10/2011   Staph aureus infection 12/18/2011   Recurrent lesions in nares   Tobacco abuse    Urinary incontinence    Past Surgical History:  Procedure Laterality Date   ABDOMINAL HYSTERECTOMY     ABDOMINAL HYSTERECTOMY  2011   complete   COLONOSCOPY     DILATION AND CURETTAGE OF UTERUS  1985   EYE SURGERY  03/03/14   Surgery on right eye for epiretinal membrane (vitreous peel)    Gated Spect wall motion stress  cardiolite  11/05/2001   POLYPECTOMY     TUBAL LIGATION  1995   UTERINE SUSPENSION     mesh   VITRECTOMY Bilateral 03/03/14   Allergies  Allergen Reactions   Keflex [Cephalexin] Diarrhea   Aspirin     REACTION: difficulty breathing   Dicyclomine Hcl     REACTION: mouth ulcers   Metronidazole     REACTION: hives, mouth ulcers   Moxifloxacin     REACTION: increased heart rate, nausea   Nsaids     REACTION: difficulty breathing   Phenergan [Promethazine Hcl]     "knocks" pt out for about 3 days   Quetiapine     Somnolence. Slept for 36 hours straight.   Sulfamethoxazole-Trimethoprim     REACTION: increased heart rate, nausea   Cymbalta [Duloxetine Hcl] Rash   Prior to Admission medications   Medication Sig Start Date End Date Taking? Authorizing Provider  albuterol (PROVENTIL HFA;VENTOLIN HFA) 108 (90 Base) MCG/ACT inhaler Inhale 2 puffs into the lungs every 6 (six) hours as needed for wheezing or shortness of breath. 05/19/15  Yes Edward Saguier, PA-C  ALPRAZolam (XANAX) 0.25 MG tablet Take 1 tablet (0.25 mg total) by mouth 3 (three) times daily as needed for sleep or anxiety. 05/31/15  Yes Bradd Canary, MD  amphetamine-dextroamphetamine (ADDERALL) 10 MG tablet Take 1 tablet (10 mg total) by mouth 2 (two) times daily. July  2017 05/31/15  Yes Bradd Canary, MD  Cholecalciferol (VITAMIN D3) 2000 UNITS TABS Take by mouth.   Yes Historical Provider, MD  HYDROcodone-acetaminophen (NORCO) 10-325 MG tablet Take 1 tablet by mouth every 6 (six) hours as needed. 07/08/15  Yes Bradd Canary, MD  hydrocortisone (ANUSOL-HC) 25 MG suppository Place 1 suppository (25 mg total) rectally at bedtime. Patient taking differently: Place 25 mg rectally 3 times/day as needed-between meals & bedtime.  09/28/14  Yes Bradd Canary, MD  levothyroxine (SYNTHROID, LEVOTHROID) 100 MCG tablet Take 1 tablet (100 mcg total) by mouth daily before breakfast. 05/31/15  Yes Bradd Canary, MD  losartan  (COZAAR) 25 MG tablet Take 1 tablet (25 mg total) by mouth daily. 05/31/15  Yes Bradd Canary, MD  metoprolol tartrate (LOPRESSOR) 25 MG tablet Take 0.5 tablets (12.5 mg total) by mouth 2 (two) times daily. 05/31/15  Yes Bradd Canary, MD  nitroGLYCERIN (NITROSTAT) 0.4 MG SL tablet Place 1 tablet (0.4 mg total) under the tongue every 5 (five) minutes as needed for chest pain (max of 3 tabs, to ER if pain persists). 03/01/15  Yes Bradd Canary, MD  ondansetron (ZOFRAN) 4 MG tablet Take 1 tablet (4 mg total) by mouth every 4 (four) hours as needed for nausea or vomiting. 05/31/15  Yes Bradd Canary, MD  ranitidine (ZANTAC) 150 MG tablet Take 1 tablet (150 mg total) by mouth 2 (two) times daily. 12/28/14  Yes Bradd Canary, MD  scopolamine (TRANSDERM-SCOP, 1.5 MG,) 1 MG/3DAYS  Place 1 patch (1.5 mg total) onto the skin every 3 (three) days. 12/28/14  Yes Bradd Canary, MD  sucralfate (CARAFATE) 1 G tablet Take 1 tablet (1 g total) by mouth 4 (four) times daily -  with meals and at bedtime. 12/28/14  Yes Bradd Canary, MD  SUMAtriptan (IMITREX) 50 MG tablet Take 1 tablet (50 mg total) by mouth once. May repeat in 2 hours if headache persists or recurs. 03/01/15  Yes Bradd Canary, MD  venlafaxine XR (EFFEXOR XR) 75 MG 24 hr capsule Take 1 capsule (75 mg total) by mouth 2 (two) times daily. 05/31/15  Yes Bradd Canary, MD  vitamin A 8000 UNIT capsule Take 8,000 Units by mouth daily.   Yes Historical Provider, MD  hydrocortisone-pramoxine (ANALPRAM HC) 2.5-1 % rectal cream Place 1 application rectally 2 (two) times daily. For 10 days 09/29/14   Lori P Hvozdovic, PA-C  hydrOXYzine (ATARAX/VISTARIL) 25 MG tablet Take 25 mg by mouth at bedtime as needed.    Historical Provider, MD  topiramate (TOPAMAX) 25 MG tablet Take 1 tablet (25 mg total) by mouth daily. Patient not taking: Reported on 08/22/2015 03/01/15   Bradd Canary, MD   Social History   Social History   Marital status: Married    Spouse name:  N/A   Number of children: 2   Years of education: N/A   Occupational History   Accounts Receivable    Social History Main Topics   Smoking status: Former Smoker    Packs/day: 1.00    Years: 30.00    Types: Cigarettes    Quit date: 05/15/2009   Smokeless tobacco: Never Used   Alcohol use No   Drug use: No   Sexual activity: No   Other Topics Concern   Not on file   Social History Narrative   No narrative on file   Review of Systems  Constitutional: Negative for chills, diaphoresis, fatigue and fever.  Gastrointestinal: Negative for abdominal pain, constipation, diarrhea, nausea and vomiting.  Genitourinary: Positive for dysuria. Negative for frequency, hematuria and urgency.  Musculoskeletal: Negative for back pain.       Objective:   Physical Exam  Constitutional: She is oriented to person, place, and time. She appears well-developed and well-nourished. No distress.  HENT:  Head: Normocephalic and atraumatic.  Eyes: EOM are normal. Pupils are equal, round, and reactive to light.  Neck: Neck supple.  Cardiovascular: Normal rate.   Pulmonary/Chest: Effort normal. No respiratory distress.  Abdominal: There is tenderness (minimal RUQ) in the right upper quadrant and suprapubic area. There is no rebound and no guarding.  Musculoskeletal: Normal range of motion.  Neurological: She is alert and oriented to person, place, and time.  Skin: Skin is warm and dry.  Psychiatric: She has a normal mood and affect. Her behavior is normal.  Nursing note and vitals reviewed.   Vitals:   08/22/15 1241  BP: 118/80  Pulse: 70  Resp: 17  Temp: 98.2 F (36.8 C)  TempSrc: Oral  SpO2: 98%  Weight: 198 lb (89.8 kg)  Height: 5' 6.5" (1.689 m)   Results for orders placed or performed in visit on 08/22/15  POCT urinalysis dipstick  Result Value Ref Range   Color, UA yellow yellow   Clarity, UA clear clear   Glucose, UA negative negative   Bilirubin, UA negative negative     Ketones, POC UA negative negative   Spec Grav, UA 1.015    Blood, UA moderate (  A) negative   pH, UA 7.0    Protein Ur, POC negative negative   Urobilinogen, UA 0.2    Nitrite, UA Negative Negative   Leukocytes, UA small (1+) (A) Negative  POCT Microscopic Urinalysis ([facility])  Result Value Ref Range   WBC,UR,HPF,POC Many (A) None WBC/hpf   RBC,UR,HPF,POC None None RBC/hpf   Bacteria Moderate (A) None, Too numerous to count   Mucus Absent Absent   Epithelial Cells, UR Per Microscopy Few (A) None, Too numerous to count cells/hpf       Assessment & Plan:      Feven Petterson Kendle is a 55 y.o. female Dysuria - Plan: POCT urinalysis dipstick, POCT Microscopic Urinalysis ([facility])  Cystitis - Plan: nitrofurantoin, macrocrystal-monohydrate, (MACROBID) 100 MG capsule, Urine culture  Cystitis/urinary tract infection. No concerning findings on exam.  Start Macrobid, check urine culture, RTC precautions discussed.  Meds ordered this encounter  Medications   nitrofurantoin, macrocrystal-monohydrate, (MACROBID) 100 MG capsule    Sig: Take 1 capsule (100 mg total) by mouth 2 (two) times daily.    Dispense:  14 capsule    Refill:  0   Patient Instructions   Start macrobid, drink plenty of fluids. Return to the clinic or go to the nearest emergency room if any of your symptoms worsen or new symptoms occur.   Urinary Tract Infection Urinary tract infections (UTIs) can develop anywhere along your urinary tract. Your urinary tract is your body's drainage system for removing wastes and extra water. Your urinary tract includes two kidneys, two ureters, a bladder, and a urethra. Your kidneys are a pair of bean-shaped organs. Each kidney is about the size of your fist. They are located below your ribs, one on each side of your spine. CAUSES Infections are caused by microbes, which are microscopic organisms, including fungi, viruses, and bacteria. These organisms are so small that they can only be  seen through a microscope. Bacteria are the microbes that most commonly cause UTIs. SYMPTOMS  Symptoms of UTIs may vary by age and gender of the patient and by the location of the infection. Symptoms in young women typically include a frequent and intense urge to urinate and a painful, burning feeling in the bladder or urethra during urination. Older women and men are more likely to be tired, shaky, and weak and have muscle aches and abdominal pain. A fever may mean the infection is in your kidneys. Other symptoms of a kidney infection include pain in your back or sides below the ribs, nausea, and vomiting. DIAGNOSIS To diagnose a UTI, your caregiver will ask you about your symptoms. Your caregiver will also ask you to provide a urine sample. The urine sample will be tested for bacteria and white blood cells. White blood cells are made by your body to help fight infection. TREATMENT  Typically, UTIs can be treated with medication. Because most UTIs are caused by a bacterial infection, they usually can be treated with the use of antibiotics. The choice of antibiotic and length of treatment depend on your symptoms and the type of bacteria causing your infection. HOME CARE INSTRUCTIONS  If you were prescribed antibiotics, take them exactly as your caregiver instructs you. Finish the medication even if you feel better after you have only taken some of the medication.  Drink enough water and fluids to keep your urine clear or pale yellow.  Avoid caffeine, tea, and carbonated beverages. They tend to irritate your bladder.  Empty your bladder often. Avoid holding urine for  long periods of time.  Empty your bladder before and after sexual intercourse.  After a bowel movement, women should cleanse from front to back. Use each tissue only once. SEEK MEDICAL CARE IF:   You have back pain.  You develop a fever.  Your symptoms do not begin to resolve within 3 days. SEEK IMMEDIATE MEDICAL CARE IF:    You have severe back pain or lower abdominal pain.  You develop chills.  You have nausea or vomiting.  You have continued burning or discomfort with urination. MAKE SURE YOU:   Understand these instructions.  Will watch your condition.  Will get help right away if you are not doing well or get worse.   This information is not intended to replace advice given to you by your health care provider. Make sure you discuss any questions you have with your health care provider.   Document Released: 10/11/2004 Document Revised: 09/22/2014 Document Reviewed: 02/09/2011 Elsevier Interactive Patient Education 2016 [employer].     IF you received an x-ray today, you will receive an invoice from [facility]. Please contact [facility] at [phone] with questions or concerns regarding your invoice.   IF you received labwork today, you will receive an invoice from [employer]. Please contact [facility] at [phone] with questions or concerns regarding your invoice.   Our billing staff will not be able to assist you with questions regarding bills from these companies.  You will be contacted with the lab results as soon as they are available. The fastest way to get your results is to activate your My Chart account. Instructions are located on the last page of this paperwork. If you have not heard from [place] regarding the results in 2 weeks, please contact this office.        I personally performed the services described in this documentation, which was scribed in my presence. The recorded information has been reviewed and considered, and addended by me as needed.   Signed,   Meredith Staggers, MD [facility].  08/22/15 1:25 PM

## 2015-08-30 ENCOUNTER — Other Ambulatory Visit: Payer: Self-pay | Admitting: Family Medicine

## 2015-08-30 DIAGNOSIS — F32A Depression, unspecified: Secondary | ICD-10-CM

## 2015-08-30 DIAGNOSIS — F329 Major depressive disorder, single episode, unspecified: Secondary | ICD-10-CM

## 2015-08-30 MED ORDER — ALPRAZOLAM 0.25 MG PO TABS: 0.2500 mg | ORAL_TABLET | Freq: Three times a day (TID) | ORAL | 3 refills | Status: DC | PRN

## 2015-09-01 NOTE — Telephone Encounter (Signed)
Faxed hardcopy to [email or web address] to [email or web address]

## 2015-09-02 ENCOUNTER — Ambulatory Visit (INDEPENDENT_AMBULATORY_CARE_PROVIDER_SITE_OTHER): Payer: Managed Care, Other (non HMO) | Admitting: Family Medicine

## 2015-09-02 ENCOUNTER — Encounter: Payer: Self-pay | Admitting: Family Medicine

## 2015-09-02 VITALS — BP 120/82 | HR 71 | Temp 97.8°F | Ht 67.0 in | Wt 199.2 lb

## 2015-09-02 DIAGNOSIS — E669 Obesity, unspecified: Secondary | ICD-10-CM

## 2015-09-02 DIAGNOSIS — I10 Essential (primary) hypertension: Secondary | ICD-10-CM | POA: Diagnosis not present

## 2015-09-02 DIAGNOSIS — E782 Mixed hyperlipidemia: Secondary | ICD-10-CM

## 2015-09-02 DIAGNOSIS — D649 Anemia, unspecified: Secondary | ICD-10-CM

## 2015-09-02 DIAGNOSIS — E039 Hypothyroidism, unspecified: Secondary | ICD-10-CM | POA: Diagnosis not present

## 2015-09-02 DIAGNOSIS — M5441 Lumbago with sciatica, right side: Secondary | ICD-10-CM

## 2015-09-02 DIAGNOSIS — F3170 Bipolar disorder, currently in remission, most recent episode unspecified: Secondary | ICD-10-CM

## 2015-09-02 DIAGNOSIS — N390 Urinary tract infection, site not specified: Secondary | ICD-10-CM

## 2015-09-02 DIAGNOSIS — R739 Hyperglycemia, unspecified: Secondary | ICD-10-CM

## 2015-09-02 DIAGNOSIS — K219 Gastro-esophageal reflux disease without esophagitis: Secondary | ICD-10-CM

## 2015-09-02 LAB — LIPID PANEL
CHOL/HDL RATIO: 4.3 ratio (ref <=5.0)
CHOLESTEROL: 172 mg/dL (ref 125–200)
HDL: 40 mg/dL — AB (ref >=46)
LDL Cholesterol: 79 mg/dL (ref <130)
TRIGLYCERIDES: 263 mg/dL — AB (ref <150)
VLDL: 53 mg/dL — AB (ref <30)

## 2015-09-02 LAB — COMPREHENSIVE METABOLIC PANEL
ALT: 16 U/L (ref 6–29)
AST: 17 U/L (ref 10–35)
Albumin: 4.1 g/dL (ref 3.6–5.1)
Alkaline Phosphatase: 95 U/L (ref 33–130)
BUN: 10 mg/dL (ref 7–25)
CALCIUM: 9 mg/dL (ref 8.6–10.4)
CHLORIDE: 105 mmol/L (ref 98–110)
CO2: 25 mmol/L (ref 20–31)
Creat: 0.91 mg/dL (ref 0.50–1.05)
Glucose, Bld: 81 mg/dL (ref 65–99)
POTASSIUM: 4.1 mmol/L (ref 3.5–5.3)
Sodium: 140 mmol/L (ref 135–146)
TOTAL PROTEIN: 6.7 g/dL (ref 6.1–8.1)
Total Bilirubin: 0.3 mg/dL (ref 0.2–1.2)

## 2015-09-02 LAB — CBC
HEMATOCRIT: 34.8 % — AB (ref 35.0–45.0)
Hemoglobin: 11.5 g/dL — ABNORMAL LOW (ref 11.7–15.5)
MCH: 28.8 pg (ref 27.0–33.0)
MCHC: 33 g/dL (ref 32.0–36.0)
MCV: 87 fL (ref 80.0–100.0)
MPV: 10.6 fL (ref 7.5–12.5)
Platelets: 213 10*3/uL (ref 140–400)
RBC: 4 MIL/uL (ref 3.80–5.10)
RDW: 13.3 % (ref 11.0–15.0)
WBC: 4.9 10*3/uL (ref 3.8–10.8)

## 2015-09-02 LAB — TSH: TSH: 0.64 m[IU]/L

## 2015-09-02 LAB — HEMOGLOBIN A1C
Hgb A1c MFr Bld: 5.4 % (ref <5.7)
MEAN PLASMA GLUCOSE: 108 mg/dL

## 2015-09-02 MED ORDER — ESTROGENS, CONJUGATED 0.625 MG/GM VA CREA: 1.0000 | TOPICAL_CREAM | VAGINAL | 5 refills | Status: DC

## 2015-09-02 MED ORDER — METHYLPHENIDATE HCL 10 MG PO TABS: 10.0000 mg | ORAL_TABLET | Freq: Two times a day (BID) | ORAL | 0 refills | Status: DC

## 2015-09-02 MED ORDER — HYDROCODONE-ACETAMINOPHEN 10-325 MG PO TABS: 1.0000 | ORAL_TABLET | Freq: Four times a day (QID) | ORAL | 0 refills | Status: DC | PRN

## 2015-09-02 MED FILL — HYDROCODON-APAP 10-325: 10-325 | 17 days supply | Qty: 70 | Fill #0

## 2015-09-02 MED FILL — METHYLPHENIDATE 10 MG TAB: 10 | 30 days supply | Qty: 60 | Fill #0

## 2015-09-02 NOTE — Progress Notes (Signed)
Pre visit review using our clinic review tool, if applicable. No additional management support is needed unless otherwise documented below in the visit note. 

## 2015-09-02 NOTE — Patient Instructions (Addendum)

## 2015-09-03 LAB — URINALYSIS
BILIRUBIN URINE: NEGATIVE
GLUCOSE, UA: NEGATIVE
HGB URINE DIPSTICK: NEGATIVE
KETONES UR: NEGATIVE
Leukocytes, UA: NEGATIVE
Nitrite: NEGATIVE
PH: 6.5 (ref 5.0–8.0)
Protein, ur: NEGATIVE
SPECIFIC GRAVITY, URINE: 1.006 (ref 1.001–1.035)

## 2015-09-04 LAB — URINE CULTURE: Organism ID, Bacteria: NO GROWTH

## 2015-09-11 DIAGNOSIS — N390 Urinary tract infection, site not specified: Secondary | ICD-10-CM | POA: Insufficient documentation

## 2015-09-11 NOTE — Assessment & Plan Note (Signed)
Encouraged DASH diet, decrease po intake and increase exercise as tolerated. Needs 7-8 hours of sleep nightly. Avoid trans fats, eat small, frequent meals every 4-5 hours with lean proteins, complex carbs and healthy fats. Minimize simple carbs 

## 2015-09-11 NOTE — Assessment & Plan Note (Signed)
Avoid offending foods, start probiotics. Do not eat large meals in late evening and consider raising head of bed.  

## 2015-09-11 NOTE — Assessment & Plan Note (Signed)
On Levothyroxine, continue to monitor 

## 2015-09-11 NOTE — Progress Notes (Signed)
Patient ID: Joan Robinson, female   DOB: July 30, 1960, 55 y.o.   MRN: [id number]   Subjective:    Patient ID: Joan Robinson, female    DOB: 12/30/1960, 55 y.o.   MRN: [id number]  Chief Complaint  Patient presents with  . Follow-up    HPI Patient is in today for follow up. She feels well today but she does endorse urinary frequency and urgency. No dysuria or hematuria. Has low back pain but this is common for her. She is doing well with her regards to her depression. Continues to be under a great deal of distress regarding her family but no suicidal ideation. Denies CP/palp/SOB/HA/congestion/fevers/GI or GU c/o. Taking meds as prescribed  Past Medical History:  Diagnosis Date  . Adenomatous colon polyp   . Allergic rhinitis   . Anemia 10/10/2011  . Anxiety and depression 01/17/2007   Qualifier: Diagnosis of  By: Everardo All MD, Cleophas Dunker   . Arthritis 07/24/2013   Likely inflammatory and following with Dr Maryln Gottron of [facility]  rheumatology  . 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 BCC (basal cell carcinoma of skin) 06/01/2012   Leg Follows with Dr Margo Aye  . Bipolar disorder (HCC) 01/17/2007   Qualifier: Diagnosis of  By: Everardo All MD, Cleophas Dunker   . Chicken pox as a child   X 2  . Colitis, Clostridium difficile 5/11,6/11  . Colon polyp    benign  . Diverticulosis   . Diverticulosis   . 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 Headache(784.0) 08/17/2012  . Hyperlipidemia   . Hypertension   . Hypothyroidism   . Hypothyroidism 08/24/2006   Qualifier: Diagnosis of  By: Everardo All MD, Cleophas Dunker    . Lower back pain   . 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 Mumps as a child  . Obesity 11/01/2013  . Perimenopausal 01/16/2012  . Preventative health care 10/10/2011  . PUD (peptic ulcer disease)   . Right knee pain 05/10/2012  . Sinusitis acute 10/10/2011  . Staph aureus infection 12/18/2011   Recurrent lesions in nares  . Tobacco abuse   . Urinary incontinence     Past Surgical History:   Procedure Laterality Date  . ABDOMINAL HYSTERECTOMY    . ABDOMINAL HYSTERECTOMY  2011   complete  . COLONOSCOPY    . DILATION AND CURETTAGE OF UTERUS  1985  . EYE SURGERY  03/03/14   Surgery on right eye for epiretinal membrane (vitreous peel)   . Gated Spect wall motion stress cardiolite  11/05/2001  . POLYPECTOMY    . TUBAL LIGATION  1995  . UTERINE SUSPENSION     mesh  . VITRECTOMY Bilateral 03/03/14    Family History  Problem Relation Age of Onset  . Heart disease Mother     stents  . Stroke Mother   . Hyperlipidemia Mother   . Hypertension Mother   . Other Mother     blood disorder- mgus  . Colon polyps Father   . Other Father     aorta disection  . Aneurysm Father   . Colon cancer Paternal Uncle   . Irritable bowel syndrome Daughter   . Other Daughter     gastritis  . Mental illness Daughter     bipolar and mood disorder  . Hypertension Sister     ?  Marland Kitchen Mental illness Son     bipolar  . Arthritis Sister   . Other Sister     thyroid  . Other  Sister     thyroid    Social History   Social History  . Marital status: Married    Spouse name: N/A  . Number of children: 2  . Years of education: N/A   Occupational History  . 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 Smoking status: Former Smoker    Packs/day: 1.00    Years: 30.00    Types: Cigarettes    Quit date: 05/15/2009  . Smokeless tobacco: Never Used  . Alcohol use No  . Drug use: No  . Sexual activity: No   Other Topics Concern  . Not on file   Social History Narrative  . No narrative on file    Outpatient Medications Prior to Visit  Medication Sig Dispense Refill  . albuterol (PROVENTIL HFA;VENTOLIN HFA) 108 (90 Base) MCG/ACT inhaler Inhale 2 puffs into the lungs every 6 (six) hours as needed for wheezing or shortness of breath. 1 Inhaler 0  . ALPRAZolam (XANAX) 0.25 MG tablet Take 1 tablet (0.25 mg total) by mouth 3 (three) times daily as needed for sleep or anxiety. 30  tablet 3  . Cholecalciferol (VITAMIN D3) 2000 UNITS TABS Take by mouth.    . hydrocortisone (ANUSOL-HC) 25 MG suppository Place 1 suppository (25 mg total) rectally at bedtime. (Patient taking differently: Place 25 mg rectally 3 times/day as needed-between meals & bedtime. ) 12 suppository 0  . hydrocortisone-pramoxine (ANALPRAM HC) 2.5-1 % rectal cream Place 1 application rectally 2 (two) times daily. For 10 days 30 g 1  . hydrOXYzine (ATARAX/VISTARIL) 25 MG tablet Take 25 mg by mouth at bedtime as needed.    Marland Kitchen levothyroxine (SYNTHROID, LEVOTHROID) 100 MCG tablet Take 1 tablet (100 mcg total) by mouth daily before breakfast. 30 tablet 6  . losartan (COZAAR) 25 MG tablet Take 1 tablet (25 mg total) by mouth daily. 30 tablet 6  . metoprolol tartrate (LOPRESSOR) 25 MG tablet Take 0.5 tablets (12.5 mg total) by mouth 2 (two) times daily. 90 tablet 1  . nitrofurantoin, macrocrystal-monohydrate, (MACROBID) 100 MG capsule Take 1 capsule (100 mg total) by mouth 2 (two) times daily. 14 capsule 0  . nitroGLYCERIN (NITROSTAT) 0.4 MG SL tablet Place 1 tablet (0.4 mg total) under the tongue every 5 (five) minutes as needed for chest pain (max of 3 tabs, to ER if pain persists). 25 tablet 0  . ondansetron (ZOFRAN) 4 MG tablet Take 1 tablet (4 mg total) by mouth every 4 (four) hours as needed for nausea or vomiting. 30 tablet 1  . ranitidine (ZANTAC) 150 MG tablet Take 1 tablet (150 mg total) by mouth 2 (two) times daily. 180 tablet 1  . sucralfate (CARAFATE) 1 G tablet Take 1 tablet (1 g total) by mouth 4 (four) times daily -  with meals and at bedtime. 120 tablet 3  . SUMAtriptan (IMITREX) 50 MG tablet Take 1 tablet (50 mg total) by mouth once. May repeat in 2 hours if headache persists or recurs. 10 tablet 0  . topiramate (TOPAMAX) 25 MG tablet Take 1 tablet (25 mg total) by mouth daily. 30 tablet 5  . venlafaxine XR (EFFEXOR XR) 75 MG 24 hr capsule Take 1 capsule (75 mg total) by mouth 2 (two) times daily. 60  capsule 5  . vitamin A 8000 UNIT capsule Take 8,000 Units by mouth daily.    Marland Kitchen amphetamine-dextroamphetamine (ADDERALL) 10 MG tablet Take 1 tablet (10 mg total) by mouth 2 (two) times daily. July  2017  60 tablet 0  . HYDROcodone-acetaminophen (NORCO) 10-325 MG tablet Take 1 tablet by mouth every 6 (six) hours as needed. 70 tablet 0  . scopolamine (TRANSDERM-SCOP, 1.5 MG,) 1 MG/3DAYS Place 1 patch (1.5 mg total) onto the skin every 3 (three) days. 3 patch 0   No facility-administered medications prior to visit.     Allergies  Allergen Reactions  . Keflex [Cephalexin] Diarrhea  . Aspirin     REACTION: difficulty breathing  . Dicyclomine Hcl     REACTION: mouth ulcers  . Metronidazole     REACTION: hives, mouth ulcers  . Moxifloxacin     REACTION: increased heart rate, nausea  . Nsaids     REACTION: difficulty breathing  . Phenergan [Promethazine Hcl]     "knocks" pt out for about 3 days  . Quetiapine     Somnolence. Slept for 36 hours straight.  . Sulfamethoxazole-Trimethoprim     REACTION: increased heart rate, nausea  . Cymbalta [Duloxetine Hcl] Rash    Review of Systems  Constitutional: Negative for fever and malaise/fatigue.  HENT: Negative for congestion.   Eyes: Negative for blurred vision.  Respiratory: Negative for shortness of breath.   Cardiovascular: Negative for chest pain, palpitations and leg swelling.  Gastrointestinal: Negative for abdominal pain, blood in stool and nausea.  Genitourinary: Positive for frequency and urgency. Negative for dysuria and hematuria.  Musculoskeletal: Positive for back pain. Negative for falls.  Skin: Negative for rash.  Neurological: Negative for dizziness, loss of consciousness and headaches.  Endo/Heme/Allergies: Negative for environmental allergies.  Psychiatric/Behavioral: Negative for depression. The patient is not nervous/anxious.        Objective:    Physical Exam  Constitutional: She is oriented to person, place, and  time. She appears well-developed and well-nourished. No distress.  HENT:  Head: Normocephalic and atraumatic.  Nose: Nose normal.  Eyes: Right eye exhibits no discharge. Left eye exhibits no discharge.  Neck: Normal range of motion. Neck supple.  Cardiovascular: Normal rate and regular rhythm.   No murmur heard. Pulmonary/Chest: Effort normal and breath sounds normal.  Abdominal: Soft. Bowel sounds are normal. There is no tenderness.  Musculoskeletal: She exhibits no edema.  Neurological: She is alert and oriented to person, place, and time.  Skin: Skin is warm and dry.  Psychiatric: She has a normal mood and affect.  Nursing note and vitals reviewed.   BP 120/82 (BP Location: Left Arm, Patient Position: Sitting, Cuff Size: Normal)   Pulse 71   Temp 97.8 F (36.6 C) (Oral)   Ht 5\' 7"  (1.702 m)   Wt 199 lb 4 oz (90.4 kg)   SpO2 97%   BMI 31.21 kg/m  Wt Readings from Last 3 Encounters:  09/02/15 199 lb 4 oz (90.4 kg)  08/22/15 198 lb (89.8 kg)  05/31/15 197 lb 4 oz (89.5 kg)     Lab Results  Component Value Date   WBC 4.9 09/02/2015   HGB 11.5 (L) 09/02/2015   HCT 34.8 (L) 09/02/2015   PLT 213 09/02/2015   GLUCOSE 81 09/02/2015   CHOL 172 09/02/2015   TRIG 263 (H) 09/02/2015   HDL 40 (L) 09/02/2015   LDLDIRECT 104.0 04/08/2015   LDLCALC 79 09/02/2015   ALT 16 09/02/2015   AST 17 09/02/2015   NA 140 09/02/2015   K 4.1 09/02/2015   CL 105 09/02/2015   CREATININE 0.91 09/02/2015   BUN 10 09/02/2015   CO2 25 09/02/2015   TSH 0.64 09/02/2015   INR  1.0 09/02/2008   HGBA1C 5.4 09/02/2015    Lab Results  Component Value Date   TSH 0.64 09/02/2015   Lab Results  Component Value Date   WBC 4.9 09/02/2015   HGB 11.5 (L) 09/02/2015   HCT 34.8 (L) 09/02/2015   MCV 87.0 09/02/2015   PLT 213 09/02/2015   Lab Results  Component Value Date   NA 140 09/02/2015   K 4.1 09/02/2015   CO2 25 09/02/2015   GLUCOSE 81 09/02/2015   BUN 10 09/02/2015   CREATININE 0.91  09/02/2015   BILITOT 0.3 09/02/2015   ALKPHOS 95 09/02/2015   AST 17 09/02/2015   ALT 16 09/02/2015   PROT 6.7 09/02/2015   ALBUMIN 4.1 09/02/2015   CALCIUM 9.0 09/02/2015   GFR 73.99 04/08/2015   Lab Results  Component Value Date   CHOL 172 09/02/2015   Lab Results  Component Value Date   HDL 40 (L) 09/02/2015   Lab Results  Component Value Date   LDLCALC 79 09/02/2015   Lab Results  Component Value Date   TRIG 263 (H) 09/02/2015   Lab Results  Component Value Date   CHOLHDL 4.3 09/02/2015   Lab Results  Component Value Date   HGBA1C 5.4 09/02/2015       Assessment & Plan:   Problem List Items Addressed This Visit    GERD (Chronic)    Avoid offending foods, start probiotics. Do not eat large meals in late evening and consider raising head of bed.       Hypothyroidism - Primary    On Levothyroxine, continue to monitor      Relevant Orders   TSH (Completed)   Hyperlipidemia, mixed    Encouraged heart healthy diet, increase exercise, avoid trans fats, consider a krill oil cap daily      Relevant Orders   Lipid panel (Completed)   Bipolar disorder (HCC)    Doing well on current meds no changes at this time      Essential hypertension   Relevant Orders   TSH (Completed)   CBC (Completed)   Comprehensive metabolic panel (Completed)   Anemia   Lower back pain   Relevant Medications   HYDROcodone-acetaminophen (NORCO) 10-325 MG tablet   Obesity    Encouraged DASH diet, decrease po intake and increase exercise as tolerated. Needs 7-8 hours of sleep nightly. Avoid trans fats, eat small, frequent meals every 4-5 hours with lean proteins, complex carbs and healthy fats. Minimize simple carbs      Relevant Medications   methylphenidate (RITALIN) 10 MG tablet   Urinary tract infectious disease    Patient symptomatic but culture negative, will need to repeat UA with c and s if symptoms worsen.       Relevant Orders   Urine culture (Completed)    Urinalysis (Completed)    Other Visit Diagnoses    Hyperglycemia       Relevant Orders   Hemoglobin A1c (Completed)      I have discontinued Ms. Bevans's scopolamine and amphetamine-dextroamphetamine. I am also having her start on conjugated estrogens and methylphenidate. Additionally, I am having her maintain her hydrOXYzine, vitamin A, Vitamin D3, hydrocortisone, hydrocortisone-pramoxine, sucralfate, ranitidine, SUMAtriptan, topiramate, nitroGLYCERIN, albuterol, venlafaxine XR, ondansetron, metoprolol tartrate, losartan, levothyroxine, nitrofurantoin (macrocrystal-monohydrate), ALPRAZolam, and HYDROcodone-acetaminophen.  Meds ordered this encounter  Medications  . HYDROcodone-acetaminophen (NORCO) 10-325 MG tablet    Sig: Take 1 tablet by mouth every 6 (six) hours as needed.    Dispense:  70 tablet  Refill:  0  . conjugated estrogens (PREMARIN) vaginal cream    Sig: Place 1 Applicatorful vaginally every other day.    Dispense:  30 g    Refill:  5  . methylphenidate (RITALIN) 10 MG tablet    Sig: Take 1 tablet (10 mg total) by mouth 2 (two) times daily with breakfast and lunch.    Dispense:  60 tablet    Refill:  0     Danise Edge, MD

## 2015-09-11 NOTE — Assessment & Plan Note (Signed)
Patient symptomatic but culture negative, will need to repeat UA with c and s if symptoms worsen.

## 2015-09-11 NOTE — Assessment & Plan Note (Signed)
Doing well on current meds no changes at this time °

## 2015-09-11 NOTE — Assessment & Plan Note (Signed)
Encouraged heart healthy diet, increase exercise, avoid trans fats, consider a krill oil cap daily 

## 2015-10-04 ENCOUNTER — Telehealth: Payer: Self-pay | Admitting: Family Medicine

## 2015-10-04 DIAGNOSIS — M5441 Lumbago with sciatica, right side: Secondary | ICD-10-CM

## 2015-10-04 NOTE — Telephone Encounter (Signed)
Relation to [id number] Call back number:[phone]   Reason for call:  Patient states methylphenidate (RITALIN) 10 MG tablet is not working. Patient requesting a refill HYDROcodone-acetaminophen (NORCO) 10-325 MG tablet and would like to pick up in the morning.

## 2015-10-04 NOTE — Telephone Encounter (Signed)
Requesting: Hydrocodone Contract  03/16/2014 UDS   Moderate Last OV   09/02/2015 Last Refill   #70 with 0 refills on 09/02/2015  Please Advise

## 2015-10-05 MED ORDER — HYDROCODONE-ACETAMINOPHEN 10-325 MG PO TABS: 1.0000 | ORAL_TABLET | Freq: Four times a day (QID) | ORAL | 0 refills | Status: DC | PRN

## 2015-10-05 NOTE — Telephone Encounter (Signed)
Refill printed awaiting PCP signature PC

## 2015-10-05 NOTE — Telephone Encounter (Signed)
Ok to refill her hydrocodone

## 2015-10-14 ENCOUNTER — Ambulatory Visit (INDEPENDENT_AMBULATORY_CARE_PROVIDER_SITE_OTHER): Payer: Managed Care, Other (non HMO) | Admitting: Family Medicine

## 2015-10-14 ENCOUNTER — Encounter: Payer: Self-pay | Admitting: Family Medicine

## 2015-10-14 DIAGNOSIS — E782 Mixed hyperlipidemia: Secondary | ICD-10-CM

## 2015-10-14 DIAGNOSIS — D649 Anemia, unspecified: Secondary | ICD-10-CM | POA: Diagnosis not present

## 2015-10-14 DIAGNOSIS — E039 Hypothyroidism, unspecified: Secondary | ICD-10-CM

## 2015-10-14 DIAGNOSIS — F909 Attention-deficit hyperactivity disorder, unspecified type: Secondary | ICD-10-CM

## 2015-10-14 DIAGNOSIS — F988 Other specified behavioral and emotional disorders with onset usually occurring in childhood and adolescence: Secondary | ICD-10-CM

## 2015-10-14 MED ORDER — METHYLPHENIDATE HCL ER (LA) 30 MG PO CP24: 30.0000 mg | ORAL_CAPSULE | ORAL | 0 refills | Status: DC

## 2015-10-14 MED FILL — METHYLPHENIDATE LA 30 MG CA: 30 | 30 days supply | Qty: 30 | Fill #0

## 2015-10-14 MED FILL — HYDROCODON-APAP 10-325: 10-325 | 17 days supply | Qty: 70 | Fill #0

## 2015-10-14 NOTE — Assessment & Plan Note (Signed)
Encouraged heart healthy diet, increase exercise, avoid trans fats, consider a krill oil cap daily. Restart Krill oil caps

## 2015-10-14 NOTE — Patient Instructions (Signed)

## 2015-10-14 NOTE — Progress Notes (Signed)
Pre visit review using our clinic review tool, if applicable. No additional management support is needed unless otherwise documented below in the visit note. 

## 2015-10-14 NOTE — Progress Notes (Signed)
Patient ID: Joan Robinson, female   DOB: 1960/02/18, 55 y.o.   MRN: [id number]   Subjective:    Patient ID: Joan Robinson, female    DOB: 1960-08-13, 55 y.o.   MRN: [id number]  Chief Complaint  Patient presents with  . Follow-up    HPI Patient is in today for follow up. She feels that the Ritalin has not helped tremendously with concentration but she has felt calmer since starting it. No concerning side effects. Denies worsening anxiety, insomnia, anorexia. Denies CP/palp/SOB/HA/congestion/fevers/GI or GU c/o. Taking meds as prescribed  Past Medical History:  Diagnosis Date  . Adenomatous colon polyp   . Allergic rhinitis   . Anemia 10/10/2011  . Anxiety and depression 01/17/2007   Qualifier: Diagnosis of  By: Everardo All MD, Cleophas Dunker   . Arthritis 07/24/2013   Likely inflammatory and following with Dr Maryln Gottron of [facility]  rheumatology  . 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 BCC (basal cell carcinoma of skin) 06/01/2012   Leg Follows with Dr Margo Aye  . Bipolar disorder (HCC) 01/17/2007   Qualifier: Diagnosis of  By: Everardo All MD, Cleophas Dunker   . Chicken pox as a child   X 2  . Colitis, Clostridium difficile 5/11,6/11  . Colon polyp    benign  . Diverticulosis   . Diverticulosis   . 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 Headache(784.0) 08/17/2012  . Hyperlipidemia   . Hypertension   . Hypothyroidism   . Hypothyroidism 08/24/2006   Qualifier: Diagnosis of  By: Everardo All MD, Cleophas Dunker    . Lower back pain   . 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 Mumps as a child  . Obesity 11/01/2013  . Perimenopausal 01/16/2012  . Preventative health care 10/10/2011  . PUD (peptic ulcer disease)   . Right knee pain 05/10/2012  . Sinusitis acute 10/10/2011  . Staph aureus infection 12/18/2011   Recurrent lesions in nares  . Tobacco abuse   . Urinary incontinence     Past Surgical History:  Procedure Laterality Date  . ABDOMINAL HYSTERECTOMY    . ABDOMINAL HYSTERECTOMY  2011   complete  . COLONOSCOPY    . DILATION AND CURETTAGE OF UTERUS  1985  . EYE SURGERY  03/03/14   Surgery on right eye for epiretinal membrane (vitreous peel)   . Gated Spect wall motion stress cardiolite  11/05/2001  . POLYPECTOMY    . TUBAL LIGATION  1995  . UTERINE SUSPENSION     mesh  . VITRECTOMY Bilateral 03/03/14    Family History  Problem Relation Age of Onset  . Heart disease Mother     stents  . Stroke Mother   . Hyperlipidemia Mother   . Hypertension Mother   . Other Mother     blood disorder- mgus  . Colon polyps Father   . Other Father     aorta disection  . Aneurysm Father   . Colon cancer Paternal Uncle   . Irritable bowel syndrome Daughter   . Other Daughter     gastritis  . Mental illness Daughter     bipolar and mood disorder  . Hypertension Sister     ?  Marland Kitchen Mental illness Son     bipolar  . Arthritis Sister   . Other Sister     thyroid  . Other Sister     thyroid    Social History   Social History  . Marital status: Married    Spouse name:  N/A  . Number of children: 2  . Years of education: N/A   Occupational History  . 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 Smoking status: Former Smoker    Packs/day: 1.00    Years: 30.00    Types: Cigarettes    Quit date: 05/15/2009  . Smokeless tobacco: Never Used  . Alcohol use No  . Drug use: No  . Sexual activity: No   Other Topics Concern  . Not on file   Social History Narrative  . No narrative on file    Outpatient Medications Prior to Visit  Medication Sig Dispense Refill  . albuterol (PROVENTIL HFA;VENTOLIN HFA) 108 (90 Base) MCG/ACT inhaler Inhale 2 puffs into the lungs every 6 (six) hours as needed for wheezing or shortness of breath. 1 Inhaler 0  . ALPRAZolam (XANAX) 0.25 MG tablet Take 1 tablet (0.25 mg total) by mouth 3 (three) times daily as needed for sleep or anxiety. 30 tablet 3  . Cholecalciferol (VITAMIN D3) 2000 UNITS TABS Take by mouth.    . conjugated  estrogens (PREMARIN) vaginal cream Place 1 Applicatorful vaginally every other day. 30 g 5  . HYDROcodone-acetaminophen (NORCO) 10-325 MG tablet Take 1 tablet by mouth every 6 (six) hours as needed. 70 tablet 0  . hydrocortisone (ANUSOL-HC) 25 MG suppository Place 1 suppository (25 mg total) rectally at bedtime. (Patient taking differently: Place 25 mg rectally 3 times/day as needed-between meals & bedtime. ) 12 suppository 0  . hydrocortisone-pramoxine (ANALPRAM HC) 2.5-1 % rectal cream Place 1 application rectally 2 (two) times daily. For 10 days 30 g 1  . hydrOXYzine (ATARAX/VISTARIL) 25 MG tablet Take 25 mg by mouth at bedtime as needed.    Marland Kitchen levothyroxine (SYNTHROID, LEVOTHROID) 100 MCG tablet Take 1 tablet (100 mcg total) by mouth daily before breakfast. 30 tablet 6  . losartan (COZAAR) 25 MG tablet Take 1 tablet (25 mg total) by mouth daily. 30 tablet 6  . metoprolol tartrate (LOPRESSOR) 25 MG tablet Take 0.5 tablets (12.5 mg total) by mouth 2 (two) times daily. 90 tablet 1  . nitroGLYCERIN (NITROSTAT) 0.4 MG SL tablet Place 1 tablet (0.4 mg total) under the tongue every 5 (five) minutes as needed for chest pain (max of 3 tabs, to ER if pain persists). 25 tablet 0  . ondansetron (ZOFRAN) 4 MG tablet Take 1 tablet (4 mg total) by mouth every 4 (four) hours as needed for nausea or vomiting. 30 tablet 1  . ranitidine (ZANTAC) 150 MG tablet Take 1 tablet (150 mg total) by mouth 2 (two) times daily. 180 tablet 1  . sucralfate (CARAFATE) 1 G tablet Take 1 tablet (1 g total) by mouth 4 (four) times daily -  with meals and at bedtime. 120 tablet 3  . SUMAtriptan (IMITREX) 50 MG tablet Take 1 tablet (50 mg total) by mouth once. May repeat in 2 hours if headache persists or recurs. 10 tablet 0  . venlafaxine XR (EFFEXOR XR) 75 MG 24 hr capsule Take 1 capsule (75 mg total) by mouth 2 (two) times daily. 60 capsule 5  . vitamin A 8000 UNIT capsule Take 8,000 Units by mouth daily.    . methylphenidate  (RITALIN) 10 MG tablet Take 1 tablet (10 mg total) by mouth 2 (two) times daily with breakfast and lunch. 60 tablet 0  . nitrofurantoin, macrocrystal-monohydrate, (MACROBID) 100 MG capsule Take 1 capsule (100 mg total) by mouth 2 (two) times daily. 14 capsule 0  .  topiramate (TOPAMAX) 25 MG tablet Take 1 tablet (25 mg total) by mouth daily. 30 tablet 5   No facility-administered medications prior to visit.     Allergies  Allergen Reactions  . Keflex [Cephalexin] Diarrhea  . Aspirin     REACTION: difficulty breathing  . Dicyclomine Hcl     REACTION: mouth ulcers  . Metronidazole     REACTION: hives, mouth ulcers  . Moxifloxacin     REACTION: increased heart rate, nausea  . Nsaids     REACTION: difficulty breathing  . Phenergan [Promethazine Hcl]     "knocks" pt out for about 3 days  . Quetiapine     Somnolence. Slept for 36 hours straight.  . Sulfamethoxazole-Trimethoprim     REACTION: increased heart rate, nausea  . Cymbalta [Duloxetine Hcl] Rash    Review of Systems  Constitutional: Positive for malaise/fatigue. Negative for fever.  HENT: Negative for congestion.   Eyes: Negative for blurred vision.  Respiratory: Negative for shortness of breath.   Cardiovascular: Negative for chest pain, palpitations and leg swelling.  Gastrointestinal: Negative for abdominal pain, blood in stool and nausea.  Genitourinary: Negative for dysuria and frequency.  Musculoskeletal: Positive for joint pain. Negative for falls.  Skin: Negative for rash.  Neurological: Negative for dizziness, loss of consciousness and headaches.  Endo/Heme/Allergies: Negative for environmental allergies.  Psychiatric/Behavioral: Negative for depression. The patient is nervous/anxious.        Objective:    Physical Exam  Constitutional: She is oriented to person, place, and time. She appears well-developed and well-nourished. No distress.  HENT:  Head: Normocephalic and atraumatic.  Nose: Nose normal.    Eyes: Right eye exhibits no discharge. Left eye exhibits no discharge.  Neck: Normal range of motion. Neck supple.  Cardiovascular: Normal rate and regular rhythm.   No murmur heard. Pulmonary/Chest: Effort normal and breath sounds normal.  Abdominal: Soft. Bowel sounds are normal. There is no tenderness.  Musculoskeletal: She exhibits no edema.  Right 3 rd finger thickened PIP joint, no redness or warmth  Neurological: She is alert and oriented to person, place, and time.  Skin: Skin is warm and dry.  Psychiatric: She has a normal mood and affect.  Nursing note and vitals reviewed.   BP 132/79 (BP Location: Right Arm, Patient Position: Sitting, Cuff Size: Normal)   Pulse (!) 58   Temp 98.2 F (36.8 C) (Oral)   Ht 5\' 6"  (1.676 m)   Wt 203 lb 2 oz (92.1 kg)   SpO2 100%   BMI 32.79 kg/m  Wt Readings from Last 3 Encounters:  10/14/15 203 lb 2 oz (92.1 kg)  09/02/15 199 lb 4 oz (90.4 kg)  08/22/15 198 lb (89.8 kg)     Lab Results  Component Value Date   WBC 4.9 09/02/2015   HGB 11.5 (L) 09/02/2015   HCT 34.8 (L) 09/02/2015   PLT 213 09/02/2015   GLUCOSE 81 09/02/2015   CHOL 172 09/02/2015   TRIG 263 (H) 09/02/2015   HDL 40 (L) 09/02/2015   LDLDIRECT 104.0 04/08/2015   LDLCALC 79 09/02/2015   ALT 16 09/02/2015   AST 17 09/02/2015   NA 140 09/02/2015   K 4.1 09/02/2015   CL 105 09/02/2015   CREATININE 0.91 09/02/2015   BUN 10 09/02/2015   CO2 25 09/02/2015   TSH 0.64 09/02/2015   INR 1.0 09/02/2008   HGBA1C 5.4 09/02/2015    Lab Results  Component Value Date   TSH 0.64 09/02/2015  Lab Results  Component Value Date   WBC 4.9 09/02/2015   HGB 11.5 (L) 09/02/2015   HCT 34.8 (L) 09/02/2015   MCV 87.0 09/02/2015   PLT 213 09/02/2015   Lab Results  Component Value Date   NA 140 09/02/2015   K 4.1 09/02/2015   CO2 25 09/02/2015   GLUCOSE 81 09/02/2015   BUN 10 09/02/2015   CREATININE 0.91 09/02/2015   BILITOT 0.3 09/02/2015   ALKPHOS 95 09/02/2015    AST 17 09/02/2015   ALT 16 09/02/2015   PROT 6.7 09/02/2015   ALBUMIN 4.1 09/02/2015   CALCIUM 9.0 09/02/2015   GFR 73.99 04/08/2015   Lab Results  Component Value Date   CHOL 172 09/02/2015   Lab Results  Component Value Date   HDL 40 (L) 09/02/2015   Lab Results  Component Value Date   LDLCALC 79 09/02/2015   Lab Results  Component Value Date   TRIG 263 (H) 09/02/2015   Lab Results  Component Value Date   CHOLHDL 4.3 09/02/2015   Lab Results  Component Value Date   HGBA1C 5.4 09/02/2015       Assessment & Plan:   Problem List Items Addressed This Visit    Hypothyroidism    On Levothyroxine, continue to monitor      Hyperlipidemia, mixed    Encouraged heart healthy diet, increase exercise, avoid trans fats, consider a krill oil cap daily. Restart Krill oil caps      Anemia    Mild, stable no concerns. Increase leafy greens, consider increased lean red meat and using cast iron cookware. Continue to monitor, report any concerns      ADD (attention deficit disorder)    She reports feeling calmer on Ritalin 10 mg bid but no significant improvement in concentration will try switching to Ritalin LA 30 mg daily and recheck in 4 weeks       Other Visit Diagnoses   None.     I have discontinued Ms. Whitehead's topiramate, nitrofurantoin (macrocrystal-monohydrate), and methylphenidate. I am also having her start on methylphenidate. Additionally, I am having her maintain her hydrOXYzine, vitamin A, Vitamin D3, hydrocortisone, hydrocortisone-pramoxine, sucralfate, ranitidine, SUMAtriptan, nitroGLYCERIN, albuterol, venlafaxine XR, ondansetron, metoprolol tartrate, losartan, levothyroxine, ALPRAZolam, conjugated estrogens, and HYDROcodone-acetaminophen.  Meds ordered this encounter  Medications  . methylphenidate (RITALIN LA) 30 MG 24 hr capsule    Sig: Take 1 capsule (30 mg total) by mouth every morning. September 2017 rx    Dispense:  30 capsule    Refill:  0      Danise Edge, MD

## 2015-10-14 NOTE — Assessment & Plan Note (Signed)
On Levothyroxine, continue to monitor 

## 2015-10-14 NOTE — Assessment & Plan Note (Signed)
She reports feeling calmer on Ritalin 10 mg bid but no significant improvement in concentration will try switching to Ritalin LA 30 mg daily and recheck in 4 weeks

## 2015-10-14 NOTE — Assessment & Plan Note (Signed)
Mild, stable no concerns. Increase leafy greens, consider increased lean red meat and using cast iron cookware. Continue to monitor, report any concerns

## 2015-10-25 ENCOUNTER — Encounter: Payer: Self-pay | Admitting: Family Medicine

## 2015-10-27 ENCOUNTER — Other Ambulatory Visit: Payer: Self-pay | Admitting: Family Medicine

## 2015-10-27 MED ORDER — RANITIDINE HCL 150 MG PO TABS: 150.0000 mg | ORAL_TABLET | Freq: Two times a day (BID) | ORAL | 1 refills | Status: DC

## 2015-11-15 ENCOUNTER — Ambulatory Visit (INDEPENDENT_AMBULATORY_CARE_PROVIDER_SITE_OTHER): Payer: Managed Care, Other (non HMO) | Admitting: Family Medicine

## 2015-11-15 ENCOUNTER — Encounter: Payer: Self-pay | Admitting: Family Medicine

## 2015-11-15 DIAGNOSIS — E039 Hypothyroidism, unspecified: Secondary | ICD-10-CM | POA: Diagnosis not present

## 2015-11-15 DIAGNOSIS — F418 Other specified anxiety disorders: Secondary | ICD-10-CM

## 2015-11-15 DIAGNOSIS — E782 Mixed hyperlipidemia: Secondary | ICD-10-CM

## 2015-11-15 DIAGNOSIS — K219 Gastro-esophageal reflux disease without esophagitis: Secondary | ICD-10-CM

## 2015-11-15 DIAGNOSIS — I10 Essential (primary) hypertension: Secondary | ICD-10-CM | POA: Diagnosis not present

## 2015-11-15 MED ORDER — METHYLPHENIDATE HCL 10 MG PO TABS: 10.0000 mg | ORAL_TABLET | Freq: Every day | ORAL | 0 refills | Status: DC

## 2015-11-15 MED ORDER — METHYLPHENIDATE HCL ER (LA) 30 MG PO CP24: 30.0000 mg | ORAL_CAPSULE | ORAL | 0 refills | Status: DC

## 2015-11-15 MED ORDER — METHYLPHENIDATE HCL 10 MG PO TABS: 10.0000 mg | ORAL_TABLET | Freq: Two times a day (BID) | ORAL | 0 refills | Status: DC

## 2015-11-15 MED ORDER — HYDROCODONE-ACETAMINOPHEN 10-325 MG PO TABS: 1.0000 | ORAL_TABLET | Freq: Four times a day (QID) | ORAL | 0 refills | Status: DC | PRN

## 2015-11-15 NOTE — Assessment & Plan Note (Signed)
Doing well with recent changes. Has recently separated from her husband.

## 2015-11-15 NOTE — Assessment & Plan Note (Signed)
Encouraged heart healthy diet, increase exercise, avoid trans fats, consider a krill oil cap daily 

## 2015-11-15 NOTE — Patient Instructions (Signed)

## 2015-11-15 NOTE — Progress Notes (Signed)
Pre visit review using our clinic review tool, if applicable. No additional management support is needed unless otherwise documented below in the visit note. 

## 2015-11-15 NOTE — Assessment & Plan Note (Signed)
Well controlled, no changes to meds. Encouraged heart healthy diet such as the DASH diet and exercise as tolerated.  °

## 2015-11-20 NOTE — Assessment & Plan Note (Signed)
Avoid offending foods, start probiotics. Do not eat large meals in late evening and consider raising head of bed. Continue current meds 

## 2015-11-20 NOTE — Assessment & Plan Note (Signed)
On Levothyroxine, continue to monitor 

## 2015-11-20 NOTE — Progress Notes (Signed)
Patient ID: Joan Robinson, female   DOB: 12-09-1960, 55 y.o.   MRN: [id number]   Subjective:    Patient ID: Joan Robinson, female    DOB: Jun 04, 1960, 55 y.o.   MRN: [id number]  Chief Complaint  Patient presents with  . Follow-up    HPI Patient is in today for follow up. She is doing well despite the recent separation from her husband. She is working and managing the stress there adequately as well. No recent illness or acute conccnerns otherwise noted. Denies CP/palp/SOB/HA/congestion/fevers/GI or GU c/o. Taking meds as prescribed  Past Medical History:  Diagnosis Date  . Adenomatous colon polyp   . Allergic rhinitis   . Anemia 10/10/2011  . Anxiety and depression 01/17/2007   Qualifier: Diagnosis of  By: Everardo All MD, Cleophas Dunker   . Arthritis 07/24/2013   Likely inflammatory and following with Dr Maryln Gottron of [facility]  rheumatology  . 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 BCC (basal cell carcinoma of skin) 06/01/2012   Leg Follows with Dr Margo Aye  . Bipolar disorder (HCC) 01/17/2007   Qualifier: Diagnosis of  By: Everardo All MD, Cleophas Dunker   . Chicken pox as a child   X 2  . Colitis, Clostridium difficile 5/11,6/11  . Colon polyp    benign  . Diverticulosis   . Diverticulosis   . 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 Headache(784.0) 08/17/2012  . Hyperlipidemia   . Hypertension   . Hypothyroidism   . Hypothyroidism 08/24/2006   Qualifier: Diagnosis of  By: Everardo All MD, Cleophas Dunker    . Lower back pain   . 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 Mumps as a child  . Obesity 11/01/2013  . Perimenopausal 01/16/2012  . Preventative health care 10/10/2011  . PUD (peptic ulcer disease)   . Right knee pain 05/10/2012  . Sinusitis acute 10/10/2011  . Staph aureus infection 12/18/2011   Recurrent lesions in nares  . Tobacco abuse   . Urinary incontinence     Past Surgical History:  Procedure Laterality Date  . ABDOMINAL HYSTERECTOMY    . ABDOMINAL HYSTERECTOMY  2011   complete  .  COLONOSCOPY    . DILATION AND CURETTAGE OF UTERUS  1985  . EYE SURGERY  03/03/14   Surgery on right eye for epiretinal membrane (vitreous peel)   . Gated Spect wall motion stress cardiolite  11/05/2001  . POLYPECTOMY    . TUBAL LIGATION  1995  . UTERINE SUSPENSION     mesh  . VITRECTOMY Bilateral 03/03/14    Family History  Problem Relation Age of Onset  . Heart disease Mother     stents  . Stroke Mother   . Hyperlipidemia Mother   . Hypertension Mother   . Other Mother     blood disorder- mgus  . Colon polyps Father   . Other Father     aorta disection  . Aneurysm Father   . Colon cancer Paternal Uncle   . Irritable bowel syndrome Daughter   . Other Daughter     gastritis  . Mental illness Daughter     bipolar and mood disorder  . Hypertension Sister     ?  Marland Kitchen Mental illness Son     bipolar  . Arthritis Sister   . Other Sister     thyroid  . Other Sister     thyroid    Social History   Social History  . Marital status: Married  Spouse name: N/A  . Number of children: 2  . Years of education: N/A   Occupational History  . 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 Smoking status: Former Smoker    Packs/day: 1.00    Years: 30.00    Types: Cigarettes    Quit date: 05/15/2009  . Smokeless tobacco: Never Used  . Alcohol use No  . Drug use: No  . Sexual activity: No   Other Topics Concern  . Not on file   Social History Narrative  . No narrative on file    Outpatient Medications Prior to Visit  Medication Sig Dispense Refill  . albuterol (PROVENTIL HFA;VENTOLIN HFA) 108 (90 Base) MCG/ACT inhaler Inhale 2 puffs into the lungs every 6 (six) hours as needed for wheezing or shortness of breath. 1 Inhaler 0  . ALPRAZolam (XANAX) 0.25 MG tablet Take 1 tablet (0.25 mg total) by mouth 3 (three) times daily as needed for sleep or anxiety. 30 tablet 3  . Cholecalciferol (VITAMIN D3) 2000 UNITS TABS Take by mouth.    . conjugated estrogens (PREMARIN)  vaginal cream Place 1 Applicatorful vaginally every other day. 30 g 5  . hydrocortisone (ANUSOL-HC) 25 MG suppository Place 1 suppository (25 mg total) rectally at bedtime. (Patient taking differently: Place 25 mg rectally 3 times/day as needed-between meals & bedtime. ) 12 suppository 0  . hydrocortisone-pramoxine (ANALPRAM HC) 2.5-1 % rectal cream Place 1 application rectally 2 (two) times daily. For 10 days 30 g 1  . hydrOXYzine (ATARAX/VISTARIL) 25 MG tablet Take 25 mg by mouth at bedtime as needed.    Marland Kitchen levothyroxine (SYNTHROID, LEVOTHROID) 100 MCG tablet Take 1 tablet (100 mcg total) by mouth daily before breakfast. 30 tablet 6  . losartan (COZAAR) 25 MG tablet Take 1 tablet (25 mg total) by mouth daily. 30 tablet 6  . metoprolol tartrate (LOPRESSOR) 25 MG tablet Take 0.5 tablets (12.5 mg total) by mouth 2 (two) times daily. 90 tablet 1  . nitroGLYCERIN (NITROSTAT) 0.4 MG SL tablet Place 1 tablet (0.4 mg total) under the tongue every 5 (five) minutes as needed for chest pain (max of 3 tabs, to ER if pain persists). 25 tablet 0  . ondansetron (ZOFRAN) 4 MG tablet Take 1 tablet (4 mg total) by mouth every 4 (four) hours as needed for nausea or vomiting. 30 tablet 1  . ranitidine (ZANTAC) 150 MG tablet Take 1 tablet (150 mg total) by mouth 2 (two) times daily. 180 tablet 1  . sucralfate (CARAFATE) 1 G tablet Take 1 tablet (1 g total) by mouth 4 (four) times daily -  with meals and at bedtime. 120 tablet 3  . SUMAtriptan (IMITREX) 50 MG tablet Take 1 tablet (50 mg total) by mouth once. May repeat in 2 hours if headache persists or recurs. 10 tablet 0  . venlafaxine XR (EFFEXOR XR) 75 MG 24 hr capsule Take 1 capsule (75 mg total) by mouth 2 (two) times daily. 60 capsule 5  . vitamin A 8000 UNIT capsule Take 8,000 Units by mouth daily.    Marland Kitchen HYDROcodone-acetaminophen (NORCO) 10-325 MG tablet Take 1 tablet by mouth every 6 (six) hours as needed. 70 tablet 0  . methylphenidate (RITALIN LA) 30 MG 24 hr  capsule Take 1 capsule (30 mg total) by mouth every morning. September 2017 rx 30 capsule 0   No facility-administered medications prior to visit.     Allergies  Allergen Reactions  . Keflex [Cephalexin] Diarrhea  .  Aspirin     REACTION: difficulty breathing  . Dicyclomine Hcl     REACTION: mouth ulcers  . Metronidazole     REACTION: hives, mouth ulcers  . Moxifloxacin     REACTION: increased heart rate, nausea  . Nsaids     REACTION: difficulty breathing  . Phenergan [Promethazine Hcl]     "knocks" pt out for about 3 days  . Quetiapine     Somnolence. Slept for 36 hours straight.  . Sulfamethoxazole-Trimethoprim     REACTION: increased heart rate, nausea  . Cymbalta [Duloxetine Hcl] Rash    Review of Systems  Constitutional: Negative for fever and malaise/fatigue.  HENT: Negative for congestion.   Eyes: Negative for blurred vision.  Respiratory: Negative for shortness of breath.   Cardiovascular: Negative for chest pain, palpitations and leg swelling.  Gastrointestinal: Positive for heartburn. Negative for abdominal pain, blood in stool and nausea.  Genitourinary: Negative for dysuria and frequency.  Musculoskeletal: Negative for falls.  Skin: Negative for rash.  Neurological: Negative for dizziness, loss of consciousness and headaches.  Endo/Heme/Allergies: Negative for environmental allergies.  Psychiatric/Behavioral: Negative for depression. The patient is nervous/anxious.        Objective:    Physical Exam  Constitutional: She is oriented to person, place, and time. She appears well-developed and well-nourished. No distress.  HENT:  Head: Normocephalic and atraumatic.  Nose: Nose normal.  Eyes: Right eye exhibits no discharge. Left eye exhibits no discharge.  Neck: Normal range of motion. Neck supple.  Cardiovascular: Normal rate and regular rhythm.   No murmur heard. Pulmonary/Chest: Effort normal and breath sounds normal.  Abdominal: Soft. Bowel sounds  are normal. There is no tenderness.  Musculoskeletal: She exhibits no edema.  Neurological: She is alert and oriented to person, place, and time.  Skin: Skin is warm and dry.  Psychiatric: She has a normal mood and affect.  Nursing note and vitals reviewed.   BP 108/72 (BP Location: Left Arm, Patient Position: Sitting, Cuff Size: Large)   Pulse 66   Temp 97.6 F (36.4 C) (Oral)   Ht 5\' 6"  (1.676 m)   Wt 203 lb 8 oz (92.3 kg)   SpO2 97%   BMI 32.85 kg/m  Wt Readings from Last 3 Encounters:  11/15/15 203 lb 8 oz (92.3 kg)  10/14/15 203 lb 2 oz (92.1 kg)  09/02/15 199 lb 4 oz (90.4 kg)     Lab Results  Component Value Date   WBC 4.9 09/02/2015   HGB 11.5 (L) 09/02/2015   HCT 34.8 (L) 09/02/2015   PLT 213 09/02/2015   GLUCOSE 81 09/02/2015   CHOL 172 09/02/2015   TRIG 263 (H) 09/02/2015   HDL 40 (L) 09/02/2015   LDLDIRECT 104.0 04/08/2015   LDLCALC 79 09/02/2015   ALT 16 09/02/2015   AST 17 09/02/2015   NA 140 09/02/2015   K 4.1 09/02/2015   CL 105 09/02/2015   CREATININE 0.91 09/02/2015   BUN 10 09/02/2015   CO2 25 09/02/2015   TSH 0.64 09/02/2015   INR 1.0 09/02/2008   HGBA1C 5.4 09/02/2015    Lab Results  Component Value Date   TSH 0.64 09/02/2015   Lab Results  Component Value Date   WBC 4.9 09/02/2015   HGB 11.5 (L) 09/02/2015   HCT 34.8 (L) 09/02/2015   MCV 87.0 09/02/2015   PLT 213 09/02/2015   Lab Results  Component Value Date   NA 140 09/02/2015   K 4.1 09/02/2015  CO2 25 09/02/2015   GLUCOSE 81 09/02/2015   BUN 10 09/02/2015   CREATININE 0.91 09/02/2015   BILITOT 0.3 09/02/2015   ALKPHOS 95 09/02/2015   AST 17 09/02/2015   ALT 16 09/02/2015   PROT 6.7 09/02/2015   ALBUMIN 4.1 09/02/2015   CALCIUM 9.0 09/02/2015   GFR 73.99 04/08/2015   Lab Results  Component Value Date   CHOL 172 09/02/2015   Lab Results  Component Value Date   HDL 40 (L) 09/02/2015   Lab Results  Component Value Date   LDLCALC 79 09/02/2015   Lab  Results  Component Value Date   TRIG 263 (H) 09/02/2015   Lab Results  Component Value Date   CHOLHDL 4.3 09/02/2015   Lab Results  Component Value Date   HGBA1C 5.4 09/02/2015       Assessment & Plan:   Problem List Items Addressed This Visit    GERD (Chronic)    Avoid offending foods, start probiotics. Do not eat large meals in late evening and consider raising head of bed. Continue current meds.      Hypothyroidism    On Levothyroxine, continue to monitor      Essential hypertension    Well controlled, no changes to meds. Encouraged heart healthy diet such as the DASH diet and exercise as tolerated.       Depression with anxiety    Doing well with recent changes. Has recently separated from her husband.       RESOLVED: Hyperlipidemia, mixed    Encouraged heart healthy diet, increase exercise, avoid trans fats, consider a krill oil cap daily         I have changed Ms. Pocius's methylphenidate, methylphenidate, methylphenidate, and methylphenidate. I am also having her start on methylphenidate and methylphenidate. Additionally, I am having her maintain her hydrOXYzine, vitamin A, Vitamin D3, hydrocortisone, hydrocortisone-pramoxine, sucralfate, SUMAtriptan, nitroGLYCERIN, albuterol, venlafaxine XR, ondansetron, metoprolol tartrate, losartan, levothyroxine, ALPRAZolam, conjugated estrogens, ranitidine, and HYDROcodone-acetaminophen.  Meds ordered this encounter  Medications  . methylphenidate (RITALIN LA) 30 MG 24 hr capsule    Sig: Take 1 capsule (30 mg total) by mouth every morning. November  2017 rx    Dispense:  30 capsule    Refill:  0  . DISCONTD: methylphenidate (RITALIN) 10 MG tablet    Sig: Take 1 tablet (10 mg total) by mouth 2 (two) times daily with breakfast and lunch. November 2017    Dispense:  60 tablet    Refill:  0  . HYDROcodone-acetaminophen (NORCO) 10-325 MG tablet    Sig: Take 1 tablet by mouth every 6 (six) hours as needed.    Dispense:  70  tablet    Refill:  0  . methylphenidate (RITALIN LA) 30 MG 24 hr capsule    Sig: Take 1 capsule (30 mg total) by mouth every morning. December 2017    Dispense:  30 capsule    Refill:  0  . methylphenidate (RITALIN LA) 30 MG 24 hr capsule    Sig: Take 1 capsule (30 mg total) by mouth every morning. janiuary 2018    Dispense:  30 capsule    Refill:  0  . DISCONTD: methylphenidate (RITALIN) 10 MG tablet    Sig: Take 10 mg by mouth 2 (two) times daily.  Marland Kitchen DISCONTD: methylphenidate (RITALIN) 10 MG tablet    Sig: Take 10 mg by mouth 2 (two) times daily.  . methylphenidate (RITALIN) 10 MG tablet    Sig: Take 1 tablet (10  mg total) by mouth daily. January 2018    Dispense:  30 tablet    Refill:  0  . methylphenidate (RITALIN) 10 MG tablet    Sig: Take 1 tablet (10 mg total) by mouth daily. December 2017    Dispense:  30 tablet    Refill:  0  . methylphenidate (RITALIN) 10 MG tablet    Sig: Take 1 tablet (10 mg total) by mouth daily. November 2017    Dispense:  30 tablet    Refill:  0     Danise Edge, MD

## 2015-11-21 MED FILL — HYDROCODON-APAP 10-325: 10-325 | 18 days supply | Qty: 70 | Fill #0

## 2015-12-14 ENCOUNTER — Other Ambulatory Visit: Payer: Self-pay | Admitting: Family Medicine

## 2015-12-15 ENCOUNTER — Other Ambulatory Visit: Payer: Self-pay | Admitting: Family Medicine

## 2015-12-15 MED ORDER — HYDROCODONE-ACETAMINOPHEN 10-325 MG PO TABS: 1.0000 | ORAL_TABLET | Freq: Four times a day (QID) | ORAL | 0 refills | Status: DC | PRN

## 2015-12-16 MED FILL — HYDROCODON-APAP 10-325: 10-325 | 18 days supply | Qty: 70 | Fill #0

## 2015-12-23 ENCOUNTER — Ambulatory Visit (INDEPENDENT_AMBULATORY_CARE_PROVIDER_SITE_OTHER): Payer: Managed Care, Other (non HMO) | Admitting: Urgent Care

## 2015-12-23 VITALS — BP 122/78 | HR 88 | Temp 98.9°F | Resp 18 | Ht 66.0 in | Wt 204.0 lb

## 2015-12-23 DIAGNOSIS — J069 Acute upper respiratory infection, unspecified: Secondary | ICD-10-CM | POA: Diagnosis not present

## 2015-12-23 DIAGNOSIS — B9789 Other viral agents as the cause of diseases classified elsewhere: Secondary | ICD-10-CM | POA: Diagnosis not present

## 2015-12-23 DIAGNOSIS — H6121 Impacted cerumen, right ear: Secondary | ICD-10-CM

## 2015-12-23 DIAGNOSIS — H9201 Otalgia, right ear: Secondary | ICD-10-CM | POA: Diagnosis not present

## 2015-12-23 MED ORDER — HYDROCODONE-HOMATROPINE 5-1.5 MG/5ML PO SYRP: 5.0000 mL | ORAL_SOLUTION | Freq: Every evening | ORAL | 0 refills | Status: DC | PRN

## 2015-12-23 MED ORDER — BENZONATATE 100 MG PO CAPS: 100.0000 mg | ORAL_CAPSULE | Freq: Three times a day (TID) | ORAL | 0 refills | Status: DC | PRN

## 2015-12-23 MED ORDER — PSEUDOEPHEDRINE HCL ER 120 MG PO TB12: 120.0000 mg | ORAL_TABLET | Freq: Two times a day (BID) | ORAL | 3 refills | Status: DC

## 2015-12-23 NOTE — Progress Notes (Signed)
MRN: [id number] DOB: [birth year]  Subjective:   Joan Robinson is a 55 y.o. female presenting for chief complaint of Cough; Fever; and Sinusitis  Reports 4 day history of right ear pain, headache, fever (highest was 100.55F), body aches, dry cough. Has tried DayQuil, NyQuil, APAP, warmed olive oil to her right ear. Denies sinus pain, sinus congestion, sore throat, chest pain, shob, wheezing, vomiting, abdominal pain, rashes. Denies smoking cigarettes. Denies history of asthma. Has mild history of allergies. Takes Allegra or Zyrtec but has not done this consistently.  Farm has a current medication list which includes the following prescription(s): albuterol, alprazolam, vitamin d3, conjugated estrogens, hydrocodone-acetaminophen, hydrocortisone, hydrocortisone-pramoxine, hydroxyzine, levothyroxine, losartan, methylphenidate, methylphenidate, methylphenidate, methylphenidate, methylphenidate, methylphenidate, metoprolol tartrate, nitroglycerin, ondansetron, ranitidine, sucralfate, sumatriptan, venlafaxine xr, and vitamin a. Also is allergic to keflex [cephalexin]; aspirin; dicyclomine hcl; metronidazole; moxifloxacin; nsaids; phenergan [promethazine hcl]; quetiapine; sulfamethoxazole-trimethoprim; and cymbalta [duloxetine hcl].  [place]  has a past medical history of Adenomatous colon polyp; Allergic rhinitis; Anemia (10/10/2011); Anxiety and depression (01/17/2007); Arthritis (07/24/2013); Autoimmune urticaria (07/24/2013); BCC (basal cell carcinoma of skin) (06/01/2012); Bipolar disorder (HCC) (01/17/2007); Chicken pox (as a child); Colitis, Clostridium difficile (5/11,6/11); Colon polyp; Diverticulosis; Diverticulosis; Freiberg's disease (04/13/2012); Glaucoma and corneal anomaly (11/01/2013); QVZDGLOV(564.3) (08/17/2012); Hyperlipidemia; Hypertension; Hypothyroidism; Hypothyroidism (08/24/2006); Lower back pain; Macular pucker, bilateral (10/10/2011); Mumps (as a child); Obesity (11/01/2013); Perimenopausal  (01/16/2012); Preventative health care (10/10/2011); PUD (peptic ulcer disease); Right knee pain (05/10/2012); Sinusitis acute (10/10/2011); Staph aureus infection (12/18/2011); Tobacco abuse; and Urinary incontinence. Also  has a past surgical history that includes Tubal ligation (1995); Uterine suspension; Colonoscopy; Gated Spect wall motion stress cardiolite (11/05/2001); Polypectomy; Abdominal hysterectomy; Abdominal hysterectomy (2011); Dilation and curettage of uterus (1985); Vitrectomy (Bilateral, 03/03/14); and Eye surgery (03/03/14).   Objective:   Vitals: BP 122/78 (BP Location: Right Arm, Patient Position: Sitting, Cuff Size: Large)   Pulse 88   Temp 98.9 F (37.2 C) (Oral)   Resp 18   Ht 5\' 6"  (1.676 m)   Wt 204 lb (92.5 kg)   SpO2 98%   BMI 32.93 kg/m   Physical Exam  Constitutional: She is oriented to person, place, and time. She appears well-developed and well-nourished.  HENT:  Right TM cerumen occluded, right sided tragus tenderness. No effusions or erythema. Nasal turbinates pink and moist, nasal passages patent. Mild maxillary and frontal sinus tenderness. Oropharynx clear, mucous membranes moist, dentition in good repair.  Eyes: Right eye exhibits no discharge. Left eye exhibits no discharge. No scleral icterus.  Neck: Normal range of motion. Neck supple.  Cardiovascular: Normal rate, regular rhythm and intact distal pulses.  Exam reveals no gallop and no friction rub.   No murmur heard. Pulmonary/Chest: No respiratory distress. She has no wheezes. She has no rales.  Lymphadenopathy:    She has cervical adenopathy (bilateral).  Neurological: She is alert and oriented to person, place, and time.  Skin: Skin is warm and dry.   Right TM intact and without erythema, drainage s/p ear lavage.  Assessment and Plan :   1. Viral URI with cough - Likely viral in nature, advised supportive care. - If no improvement or symptoms do not resolve return to clinic in 1 week.  2.  Right ear pain 3. Impacted cerumen of right ear - Ear irrigation performed, cerumen impaction resolved.  Wallis Bamberg, PA-C [facility] [phone] 12/23/2015 2:46 PM

## 2015-12-23 NOTE — Patient Instructions (Addendum)
Upper Respiratory Infection, Adult Most upper respiratory infections (URIs) are a viral infection of the air passages leading to the lungs. A URI affects the nose, throat, and upper air passages. The most common type of URI is nasopharyngitis and is typically referred to as "the common cold." URIs run their course and usually go away on their own. Most of the time, a URI does not require medical attention, but sometimes a bacterial infection in the upper airways can follow a viral infection. This is called a secondary infection. Sinus and middle ear infections are common types of secondary upper respiratory infections. Bacterial pneumonia can also complicate a URI. A URI can worsen asthma and chronic obstructive pulmonary disease (COPD). Sometimes, these complications can require emergency medical care and may be life threatening. What are the causes? Almost all URIs are caused by viruses. A virus is a type of germ and can spread from one person to another. What increases the risk? You may be at risk for a URI if:  You smoke.  You have chronic heart or lung disease.  You have a weakened defense (immune) system.  You are very young or very old.  You have nasal allergies or asthma.  You work in crowded or poorly ventilated areas.  You work in health care facilities or schools.  What are the signs or symptoms? Symptoms typically develop 2-3 days after you come in contact with a cold virus. Most viral URIs last 7-10 days. However, viral URIs from the influenza virus (flu virus) can last 14-18 days and are typically more severe. Symptoms may include:  Runny or stuffy (congested) nose.  Sneezing.  Cough.  Sore throat.  Headache.  Fatigue.  Fever.  Loss of appetite.  Pain in your forehead, behind your eyes, and over your cheekbones (sinus pain).  Muscle aches.  How is this diagnosed? Your health care provider may diagnose a URI by:  Physical exam.  Tests to check that your  symptoms are not due to another condition such as: ? Strep throat. ? Sinusitis. ? Pneumonia. ? Asthma.  How is this treated? A URI goes away on its own with time. It cannot be cured with medicines, but medicines may be prescribed or recommended to relieve symptoms. Medicines may help:  Reduce your fever.  Reduce your cough.  Relieve nasal congestion.  Follow these instructions at home:  Take medicines only as directed by your health care provider.  Gargle warm saltwater or take cough drops to comfort your throat as directed by your health care provider.  Use a warm mist humidifier or inhale steam from a shower to increase air moisture. This may make it easier to breathe.  Drink enough fluid to keep your urine clear or pale yellow.  Eat soups and other clear broths and maintain good nutrition.  Rest as needed.  Return to work when your temperature has returned to normal or as your health care provider advises. You may need to stay home longer to avoid infecting others. You can also use a face mask and careful hand washing to prevent spread of the virus.  Increase the usage of your inhaler if you have asthma.  Do not use any tobacco products, including cigarettes, chewing tobacco, or electronic cigarettes. If you need help quitting, ask your health care provider. How is this prevented? The best way to protect yourself from getting a cold is to practice good hygiene.  Avoid oral or hand contact with people with cold symptoms.  Wash your   occurs. There is no clear evidence that vitamin C, vitamin E, echinacea, or exercise reduces the chance of developing a cold. However, it is always recommended to get plenty of rest, exercise, and practice good nutrition. Contact a health care provider if:  You are getting worse rather than better.  Your symptoms are not controlled by medicine.  You have chills.  You have worsening shortness of breath.  You have brown  or red mucus.  You have yellow or brown nasal discharge.  You have pain in your face, especially when you bend forward.  You have a fever.  You have swollen neck glands.  You have pain while swallowing.  You have white areas in the back of your throat. Get help right away if:  You have severe or persistent:  Headache.  Ear pain.  Sinus pain.  Chest pain.  You have chronic lung disease and any of the following:  Wheezing.  Prolonged cough.  Coughing up blood.  A change in your usual mucus.  You have a stiff neck.  You have changes in your:  Vision.  Hearing.  Thinking.  Mood. This information is not intended to replace advice given to you by your health care provider. Make sure you discuss any questions you have with your health care provider. Document Released: 06/27/2000 Document Revised: 09/04/2015 Document Reviewed: 04/08/2013 Elsevier Interactive Patient Education  2017 [employer].     IF you received an x-ray today, you will receive an invoice from [facility]. Please contact [facility] at [phone] with questions or concerns regarding your invoice.   IF you received labwork today, you will receive an invoice from [employer]. Please contact [facility] at [phone] with questions or concerns regarding your invoice.   Our billing staff will not be able to assist you with questions regarding bills from these companies.  You will be contacted with the lab results as soon as they are available. The fastest way to get your results is to activate your My Chart account. Instructions are located on the last page of this paperwork. If you have not heard from [place] regarding the results in 2 weeks, please contact this office.

## 2016-01-27 ENCOUNTER — Other Ambulatory Visit: Payer: Self-pay | Admitting: Family Medicine

## 2016-01-27 NOTE — Telephone Encounter (Signed)
Patient is requesting a refill of HYDROcodone-acetaminophen ([place]) 10-325 MG tablet  Please advise  Phone: [phone]

## 2016-01-27 NOTE — Telephone Encounter (Signed)
Notify I am willing to refill the norco but she will need an updated UDS and contract. Make sure she has has an appt within  The 6 month window and if not make her an appt.

## 2016-01-27 NOTE — Telephone Encounter (Signed)
Last seen 11/15/15  Last filled 12/15/15 #70-0 refills  Last UDS 11/11/14 Low Risk  Contracted Signed 10/30/14  Please advise  PC

## 2016-01-30 NOTE — Telephone Encounter (Signed)
Called left message to call back 

## 2016-01-31 ENCOUNTER — Encounter: Payer: Self-pay | Admitting: Family Medicine

## 2016-01-31 MED ORDER — HYDROCODONE-ACETAMINOPHEN 10-325 MG PO TABS: 1.0000 | ORAL_TABLET | Freq: Four times a day (QID) | ORAL | 0 refills | Status: DC | PRN

## 2016-01-31 NOTE — Telephone Encounter (Signed)
Spoke to the patient informed her of PCP instructions.  She agreed to do so. Printed prescription/letter. Patient has appt already on 03/2016.

## 2016-02-07 ENCOUNTER — Ambulatory Visit: Payer: Managed Care, Other (non HMO)

## 2016-02-07 ENCOUNTER — Ambulatory Visit (INDEPENDENT_AMBULATORY_CARE_PROVIDER_SITE_OTHER): Payer: Managed Care, Other (non HMO) | Admitting: Family Medicine

## 2016-02-07 ENCOUNTER — Encounter: Payer: Self-pay | Admitting: Family Medicine

## 2016-02-07 VITALS — BP 119/77 | HR 63 | Temp 97.9°F | Resp 20 | Wt 207.2 lb

## 2016-02-07 DIAGNOSIS — J209 Acute bronchitis, unspecified: Secondary | ICD-10-CM

## 2016-02-07 MED ORDER — AZITHROMYCIN 250 MG PO TABS: ORAL_TABLET | ORAL | 0 refills | Status: DC

## 2016-02-07 MED ORDER — PREDNISONE 50 MG PO TABS: 50.0000 mg | ORAL_TABLET | Freq: Every day | ORAL | 0 refills | Status: DC

## 2016-02-07 NOTE — Progress Notes (Signed)
Joan Robinson , [birth year], 56 y.o., female MRN: [id number] Patient Care Team    Relationship Specialty Notifications Start End  Bradd Canary, MD PCP - General Family Medicine  10/10/11   Sherrie George, MD Consulting Physician Ophthalmology  04/29/14   Barrett Henle, MD  Ophthalmology  04/29/14   Orpah Cobb, MD Consulting Physician Cardiology  04/29/14   Donnetta Hail, MD Consulting Physician Rheumatology  04/29/14     CC: Cough  Subjective: Pt presents for an OV with complaints of cough of > 6 weeks duration.  Associated symptoms include rhinorrhea (clear), productive cough sometimes,  and fatigue. Pt has tried OTC air bourne, tessalon perles, cough syrup to ease their symptoms. She denies nausea, vomit, diarrhea or shortness of breath. . She was seen in the UC early December at onset of symptoms and treat for cough and OTC therapy for a virus. Her husband was in the hospital just prior to her symptoms with pneumonia.    UC note 12/18/2015:  Reports 4 day history of right ear pain, headache, fever (highest was 100.18F), body aches, dry cough. Has tried DayQuil, NyQuil, APAP, warmed olive oil to her right ear. Denies sinus pain, sinus congestion, sore throat, chest pain, shob, wheezing, vomiting, abdominal pain, rashes.    Allergies  Allergen Reactions  . Hydrocodone-Acetaminophen Anaphylaxis, Nausea Only and Palpitations  . Keflex [Cephalexin] Diarrhea  . Aspirin     REACTION: difficulty breathing  . Butamben-Tetracaine-Benzocaine     Mouth sores  . Dicyclomine Hcl     REACTION: mouth ulcers  . Metronidazole     REACTION: hives, mouth ulcers  . Moxifloxacin     REACTION: increased heart rate, nausea  . Nsaids     REACTION: difficulty breathing  . Phenergan [Promethazine Hcl]     "knocks" pt out for about 3 days  . Quetiapine     Somnolence. Slept for 36 hours straight.  . Sulfamethoxazole-Trimethoprim     REACTION: increased heart rate, nausea  . Cymbalta [Duloxetine  Hcl] Rash  . Levothyroxine Palpitations  . Moxifloxacin Hcl In Nacl Palpitations  . Orphenadrine Citrate Palpitations   Social History  Substance Use Topics  . Smoking status: Former Smoker    Packs/day: 1.00    Years: 30.00    Types: Cigarettes    Quit date: 05/15/2009  . Smokeless tobacco: Never Used  . Alcohol use No   Past Medical History:  Diagnosis Date  . Adenomatous colon polyp   . Allergic rhinitis   . Anemia 10/10/2011  . Anxiety and depression 01/17/2007   Qualifier: Diagnosis of  By: Everardo All MD, Cleophas Dunker   . Arthritis 07/24/2013   Likely inflammatory and following with Dr Maryln Gottron of [facility]  rheumatology  . 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 BCC (basal cell carcinoma of skin) 06/01/2012   Leg Follows with Dr Margo Aye  . Bipolar disorder (HCC) 01/17/2007   Qualifier: Diagnosis of  By: Everardo All MD, Cleophas Dunker   . Chicken pox as a child   X 2  . Colitis, Clostridium difficile 5/11,6/11  . Colon polyp    benign  . Diverticulosis   . Diverticulosis   . 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 Headache(784.0) 08/17/2012  . Hyperlipidemia   . Hypertension   . Hypothyroidism   . Hypothyroidism 08/24/2006   Qualifier: Diagnosis of  By: Everardo All MD, Cleophas Dunker    . Lower back pain   . Macular pucker, bilateral  10/10/2011  . Mumps as a child  . Obesity 11/01/2013  . Perimenopausal 01/16/2012  . Preventative health care 10/10/2011  . PUD (peptic ulcer disease)   . Right knee pain 05/10/2012  . Sinusitis acute 10/10/2011  . Staph aureus infection 12/18/2011   Recurrent lesions in nares  . Tobacco abuse   . Urinary incontinence    Past Surgical History:  Procedure Laterality Date  . ABDOMINAL HYSTERECTOMY    . ABDOMINAL HYSTERECTOMY  2011   complete  . COLONOSCOPY    . DILATION AND CURETTAGE OF UTERUS  1985  . EYE SURGERY  03/03/14   Surgery on right eye for epiretinal membrane (vitreous peel)   . Gated Spect wall motion stress cardiolite  11/05/2001  .  POLYPECTOMY    . TUBAL LIGATION  1995  . UTERINE SUSPENSION     mesh  . VITRECTOMY Bilateral 03/03/14   Family History  Problem Relation Age of Onset  . Heart disease Mother     stents  . Stroke Mother   . Hyperlipidemia Mother   . Hypertension Mother   . Other Mother     blood disorder- mgus  . Colon polyps Father   . Other Father     aorta disection  . Aneurysm Father   . Irritable bowel syndrome Daughter   . Other Daughter     gastritis  . Mental illness Daughter     bipolar and mood disorder  . Hypertension Sister     ?  Marland Kitchen Mental illness Son     bipolar  . Arthritis Sister   . Other Sister     thyroid  . Other Sister     thyroid  . Colon cancer Paternal Uncle    Allergies as of 02/07/2016      Reactions   Hydrocodone-acetaminophen Anaphylaxis, Nausea Only, Palpitations   Keflex [cephalexin] Diarrhea   Aspirin    REACTION: difficulty breathing   Butamben-tetracaine-benzocaine    Mouth sores   Dicyclomine Hcl    REACTION: mouth ulcers   Metronidazole    REACTION: hives, mouth ulcers   Moxifloxacin    REACTION: increased heart rate, nausea   Nsaids    REACTION: difficulty breathing   Phenergan [promethazine Hcl]    "knocks" pt out for about 3 days   Quetiapine    Somnolence. Slept for 36 hours straight.   Sulfamethoxazole-trimethoprim    REACTION: increased heart rate, nausea   Cymbalta [duloxetine Hcl] Rash   Levothyroxine Palpitations   Moxifloxacin Hcl In Nacl Palpitations   Orphenadrine Citrate Palpitations      Medication List       Accurate as of 02/07/16  9:42 AM. Always use your most recent med list.          albuterol 108 (90 Base) MCG/ACT inhaler Commonly known as:  PROVENTIL HFA;VENTOLIN HFA Inhale 2 puffs into the lungs every 6 (six) hours as needed for wheezing or shortness of breath.   ALPRAZolam 0.25 MG tablet Commonly known as:  XANAX Take 1 tablet (0.25 mg total) by mouth 3 (three) times daily as needed for sleep or  anxiety.   benzonatate 100 MG capsule Commonly known as:  TESSALON Take 1-2 capsules (100-200 mg total) by mouth 3 (three) times daily as needed for cough.   conjugated estrogens vaginal cream Commonly known as:  PREMARIN Place 1 Applicatorful vaginally every other day.   HYDROcodone-acetaminophen 10-325 MG tablet Commonly known as:  NORCO Take 1 tablet by mouth every 6 (six)  hours as needed.   HYDROcodone-homatropine 5-1.5 MG/5ML syrup Commonly known as:  HYCODAN Take 5 mLs by mouth at bedtime as needed.   hydrocortisone 25 MG suppository Commonly known as:  ANUSOL-HC Place 1 suppository (25 mg total) rectally at bedtime.   hydrocortisone-pramoxine 2.5-1 % rectal cream Commonly known as:  ANALPRAM HC Place 1 application rectally 2 (two) times daily. For 10 days   hydrOXYzine 25 MG tablet Commonly known as:  ATARAX/VISTARIL Take 25 mg by mouth at bedtime as needed.   latanoprost 0.005 % ophthalmic solution Commonly known as:  XALATAN Apply to eye.   latanoprost 0.005 % ophthalmic solution Commonly known as:  XALATAN   levothyroxine 100 MCG tablet Commonly known as:  SYNTHROID, LEVOTHROID Take 1 tablet (100 mcg total) by mouth daily before breakfast.   losartan 25 MG tablet Commonly known as:  COZAAR Take 1 tablet (25 mg total) by mouth daily.   methylphenidate 30 MG 24 hr capsule Commonly known as:  RITALIN LA Take 1 capsule (30 mg total) by mouth every morning. November  2017 rx   methylphenidate 10 MG tablet Commonly known as:  RITALIN Take 1 tablet (10 mg total) by mouth daily. December 2017   metoprolol tartrate 25 MG tablet Commonly known as:  LOPRESSOR Take 0.5 tablets (12.5 mg total) by mouth 2 (two) times daily.   nitroGLYCERIN 0.4 MG SL tablet Commonly known as:  NITROSTAT Place 1 tablet (0.4 mg total) under the tongue every 5 (five) minutes as needed for chest pain (max of 3 tabs, to ER if pain persists).   ondansetron 4 MG tablet Commonly known  as:  ZOFRAN Take 1 tablet (4 mg total) by mouth every 4 (four) hours as needed for nausea or vomiting.   pseudoephedrine 120 MG 12 hr tablet Commonly known as:  SUDAFED 12 HOUR Take 1 tablet (120 mg total) by mouth 2 (two) times daily.   ranitidine 150 MG tablet Commonly known as:  ZANTAC Take 1 tablet (150 mg total) by mouth 2 (two) times daily.   sucralfate 1 g tablet Commonly known as:  CARAFATE Take 1 tablet (1 g total) by mouth 4 (four) times daily -  with meals and at bedtime.   SUMAtriptan 50 MG tablet Commonly known as:  IMITREX Take 1 tablet (50 mg total) by mouth once. May repeat in 2 hours if headache persists or recurs.   venlafaxine XR 75 MG 24 hr capsule Commonly known as:  EFFEXOR XR Take 1 capsule (75 mg total) by mouth 2 (two) times daily.   vitamin A 8000 UNIT capsule Take 8,000 Units by mouth daily.   Vitamin D3 2000 units Tabs Take by mouth.       No results found for this or any previous visit (from the past 24 hour(s)). No results found.   ROS: Negative, with the exception of above mentioned in HPI   Objective:  BP 119/77 (BP Location: Right Arm, Patient Position: Sitting, Cuff Size: Normal)   Pulse 63   Temp 97.9 F (36.6 C)   Resp 20   Wt 207 lb 4 oz (94 kg)   SpO2 97%   BMI 33.45 kg/m  Body mass index is 33.45 kg/m. Gen: Afebrile. No acute distress. Nontoxic in appearance, well developed, well nourished. Tired appearing.  HENT: AT. [place]. Bilateral TM visualized with bilateral air fluid levels. MMM, no oral lesions. Bilateral nares with erythema and drainage. Throat without erythema or exudates. Mild cough, hoarseness, TTP right max sinus.  Eyes:Pupils Equal Round Reactive to light, Extraocular movements intact,  Conjunctiva without redness, discharge or icterus. Neck/lymp/endocrine: Supple,no lymphadenopathy CV: RRR, no edema Chest: CTAB, no wheeze or crackles. Good air movement, normal resp effort.  Abd: Soft. NTND. BS present.     Assessment/Plan: Joan Robinson is a 56 y.o. female present for acute OV for   Acute bronchitis, unspecified organism - predniSONE (DELTASONE) 50 MG tablet; Take 1 tablet (50 mg total) by mouth daily with breakfast.  Dispense: 5 tablet; Refill: 0 - azithromycin (ZITHROMAX) 250 MG tablet; 500 mg day 1, then 250 mg a day.  Dispense: 6 tablet; Refill: 0 - Prednisone, Z-pack, Rest, hydrate, tessalon perles.  - F/U 2 weeks if not improving.    electronically signed by:  Felix Pacini, DO  [facility] [place]

## 2016-02-07 NOTE — Patient Instructions (Addendum)
Start prednisone and Z-pack today.  Rest, hydrate, tessalon perles.   It appears to be  bronchitis/sinus drainage.   Follow if not improving in 2 weeks.

## 2016-02-17 ENCOUNTER — Encounter: Payer: Self-pay | Admitting: Family Medicine

## 2016-02-17 ENCOUNTER — Other Ambulatory Visit: Payer: Managed Care, Other (non HMO)

## 2016-02-17 MED FILL — HYDROCODON-APAP 10-325: 10-325 | 18 days supply | Qty: 70 | Fill #0

## 2016-02-23 ENCOUNTER — Other Ambulatory Visit: Payer: Self-pay

## 2016-02-23 DIAGNOSIS — F32A Depression, unspecified: Secondary | ICD-10-CM

## 2016-02-23 DIAGNOSIS — F329 Major depressive disorder, single episode, unspecified: Secondary | ICD-10-CM

## 2016-02-23 DIAGNOSIS — I10 Essential (primary) hypertension: Secondary | ICD-10-CM

## 2016-02-23 MED ORDER — METOPROLOL TARTRATE 25 MG PO TABS: 12.5000 mg | ORAL_TABLET | Freq: Two times a day (BID) | ORAL | 1 refills | Status: DC

## 2016-02-23 NOTE — Telephone Encounter (Addendum)
Last seen 02/07/16  Last filled 08/30/15 [id number]  Sig: Take 1 tablet (0.25 mg total) by mouth 3 (three) times daily as needed for sleep or anxiety.   Contract signed 10/29/13   NO UDS on file  Please advise  PC

## 2016-02-25 ENCOUNTER — Other Ambulatory Visit: Payer: Self-pay | Admitting: Family Medicine

## 2016-02-25 DIAGNOSIS — R1013 Epigastric pain: Secondary | ICD-10-CM

## 2016-02-28 MED ORDER — ALPRAZOLAM 0.25 MG PO TABS: 0.2500 mg | ORAL_TABLET | Freq: Three times a day (TID) | ORAL | 3 refills | Status: DC | PRN

## 2016-02-28 NOTE — Telephone Encounter (Signed)
OK to refill with #30 with 1 rf but haveher update contract and do a UDS, just let her know we had to develop new policies with the new law and everyone needs to have this to take these meds.

## 2016-03-06 ENCOUNTER — Other Ambulatory Visit: Payer: Self-pay | Admitting: Family Medicine

## 2016-03-06 DIAGNOSIS — F32A Depression, unspecified: Secondary | ICD-10-CM

## 2016-03-06 DIAGNOSIS — F329 Major depressive disorder, single episode, unspecified: Secondary | ICD-10-CM

## 2016-03-06 NOTE — Telephone Encounter (Signed)
Received refill request for Xanax 0.25mg  (take 1 tab po TID PRN for Sleep).  Last Rf: 01/30/2016 Last Ov: 11/15/2015 Next Ov: 03/16/2016 UDS: 10/27/13 controlled substance contract signed, uds sample given, Low risk, next screen 04/28/14.  03/16/14 controlled substance contract signed, uds sample given, Per Belenda Cruise, she wanted a uds done. Moderate risk, next screen 06/16/14.  Forwarded to the Provider for review, approval or denial.

## 2016-03-07 MED ORDER — ALPRAZOLAM 0.25 MG PO TABS: ORAL_TABLET | ORAL | 0 refills | Status: DC

## 2016-03-07 NOTE — Addendum Note (Signed)
Addended by: Sharen Counter D on: 03/07/2016 08:17 AM   Modules accepted: Orders

## 2016-03-07 NOTE — Telephone Encounter (Signed)
Hard copy of Xanax 0.25mg  Faxed to [facility] [place], [place])

## 2016-03-12 ENCOUNTER — Other Ambulatory Visit: Payer: Self-pay | Admitting: Family Medicine

## 2016-03-12 MED ORDER — LOSARTAN POTASSIUM 25 MG PO TABS: 25.0000 mg | ORAL_TABLET | Freq: Every day | ORAL | 6 refills | Status: DC

## 2016-03-16 ENCOUNTER — Ambulatory Visit: Payer: Managed Care, Other (non HMO) | Admitting: Family Medicine

## 2016-03-19 ENCOUNTER — Ambulatory Visit (INDEPENDENT_AMBULATORY_CARE_PROVIDER_SITE_OTHER): Payer: Managed Care, Other (non HMO) | Admitting: Family Medicine

## 2016-03-19 ENCOUNTER — Encounter: Payer: Self-pay | Admitting: Family Medicine

## 2016-03-19 VITALS — BP 110/74 | HR 58 | Temp 98.2°F | Resp 20 | Wt 199.5 lb

## 2016-03-19 DIAGNOSIS — B349 Viral infection, unspecified: Secondary | ICD-10-CM

## 2016-03-19 DIAGNOSIS — R6889 Other general symptoms and signs: Secondary | ICD-10-CM | POA: Diagnosis not present

## 2016-03-19 LAB — POC INFLUENZA A&B (BINAX/QUICKVUE)
INFLUENZA B, POC: NEGATIVE
Influenza A, POC: NEGATIVE

## 2016-03-19 MED ORDER — DOXYCYCLINE HYCLATE 100 MG PO TABS: 100.0000 mg | ORAL_TABLET | Freq: Two times a day (BID) | ORAL | 0 refills | Status: DC

## 2016-03-19 NOTE — Progress Notes (Signed)
Joan Robinson , [birth year], 56 y.o., female MRN: [id number] Patient Care Team    Relationship Specialty Notifications Start End  Bradd Canary, MD PCP - General Family Medicine  10/10/11   Sherrie George, MD Consulting Physician Ophthalmology  04/29/14   Barrett Henle, MD  Ophthalmology  04/29/14   Orpah Cobb, MD Consulting Physician Cardiology  04/29/14   Donnetta Hail, MD Consulting Physician Rheumatology  04/29/14     CC: fever Subjective: Pt presents for an acute OV with complaints of fever of 2 days duration.  Associated symptoms include fever (101F), chills, nausea, vomit x1, dry cough, mild nasal congestion. Pt has tried rest, hydrate, tylenol to ease their symptoms. She is tolerating PO now.    Depression screen [facility] 2/9 12/23/2015 08/22/2015 10/07/2014 09/28/2014 04/30/2014  Decreased Interest 0 0 0 0 0  Down, Depressed, Hopeless 0 0 0 0 1  PHQ - 2 Score 0 0 0 0 1    Allergies  Allergen Reactions  . Hydrocodone-Acetaminophen Anaphylaxis, Nausea Only and Palpitations  . Keflex [Cephalexin] Diarrhea  . Aspirin     REACTION: difficulty breathing  . Butamben-Tetracaine-Benzocaine     Mouth sores  . Dicyclomine Hcl     REACTION: mouth ulcers  . Metronidazole     REACTION: hives, mouth ulcers  . Moxifloxacin     REACTION: increased heart rate, nausea  . Nsaids     REACTION: difficulty breathing  . Phenergan [Promethazine Hcl]     "knocks" pt out for about 3 days  . Quetiapine     Somnolence. Slept for 36 hours straight.  . Sulfamethoxazole-Trimethoprim     REACTION: increased heart rate, nausea  . Cymbalta [Duloxetine Hcl] Rash  . Levothyroxine Palpitations  . Moxifloxacin Hcl In Nacl Palpitations  . Orphenadrine Citrate Palpitations   Social History  Substance Use Topics  . Smoking status: Former Smoker    Packs/day: 1.00    Years: 30.00    Types: Cigarettes    Quit date: 05/15/2009  . Smokeless tobacco: Never Used  . Alcohol use No   Past Medical History:   Diagnosis Date  . Adenomatous colon polyp   . Allergic rhinitis   . Anemia 10/10/2011  . Anxiety and depression 01/17/2007   Qualifier: Diagnosis of  By: Everardo All MD, Cleophas Dunker   . Arthritis 07/24/2013   Likely inflammatory and following with Dr Maryln Gottron of [facility]  rheumatology  . 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 BCC (basal cell carcinoma of skin) 06/01/2012   Leg Follows with Dr Margo Aye  . Bipolar disorder (HCC) 01/17/2007   Qualifier: Diagnosis of  By: Everardo All MD, Cleophas Dunker   . Chicken pox as a child   X 2  . Colitis, Clostridium difficile 5/11,6/11  . Colon polyp    benign  . Diverticulosis   . Diverticulosis   . 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 Headache(784.0) 08/17/2012  . Hyperlipidemia   . Hypertension   . Hypothyroidism   . Hypothyroidism 08/24/2006   Qualifier: Diagnosis of  By: Everardo All MD, Cleophas Dunker    . Lower back pain   . 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 Mumps as a child  . Obesity 11/01/2013  . Perimenopausal 01/16/2012  . Preventative health care 10/10/2011  . PUD (peptic ulcer disease)   . Right knee pain 05/10/2012  . Sinusitis acute 10/10/2011  . Staph aureus infection 12/18/2011   Recurrent lesions in nares  . Tobacco  abuse   . Urinary incontinence    Past Surgical History:  Procedure Laterality Date  . ABDOMINAL HYSTERECTOMY    . ABDOMINAL HYSTERECTOMY  2011   complete  . COLONOSCOPY    . DILATION AND CURETTAGE OF UTERUS  1985  . EYE SURGERY  03/03/14   Surgery on right eye for epiretinal membrane (vitreous peel)   . Gated Spect wall motion stress cardiolite  11/05/2001  . POLYPECTOMY    . TUBAL LIGATION  1995  . UTERINE SUSPENSION     mesh  . VITRECTOMY Bilateral 03/03/14   Family History  Problem Relation Age of Onset  . Heart disease Mother     stents  . Stroke Mother   . Hyperlipidemia Mother   . Hypertension Mother   . Other Mother     blood disorder- mgus  . Colon polyps Father   . Other Father     aorta  disection  . Aneurysm Father   . Irritable bowel syndrome Daughter   . Other Daughter     gastritis  . Mental illness Daughter     bipolar and mood disorder  . Hypertension Sister     ?  Marland Kitchen Mental illness Son     bipolar  . Arthritis Sister   . Other Sister     thyroid  . Other Sister     thyroid  . Colon cancer Paternal Uncle    Allergies as of 03/19/2016      Reactions   Hydrocodone-acetaminophen Anaphylaxis, Nausea Only, Palpitations   Keflex [cephalexin] Diarrhea   Aspirin    REACTION: difficulty breathing   Butamben-tetracaine-benzocaine    Mouth sores   Dicyclomine Hcl    REACTION: mouth ulcers   Metronidazole    REACTION: hives, mouth ulcers   Moxifloxacin    REACTION: increased heart rate, nausea   Nsaids    REACTION: difficulty breathing   Phenergan [promethazine Hcl]    "knocks" pt out for about 3 days   Quetiapine    Somnolence. Slept for 36 hours straight.   Sulfamethoxazole-trimethoprim    REACTION: increased heart rate, nausea   Cymbalta [duloxetine Hcl] Rash   Levothyroxine Palpitations   Moxifloxacin Hcl In Nacl Palpitations   Orphenadrine Citrate Palpitations      Medication List       Accurate as of 03/19/16  2:06 PM. Always use your most recent med list.          albuterol 108 (90 Base) MCG/ACT inhaler Commonly known as:  PROVENTIL HFA;VENTOLIN HFA Inhale 2 puffs into the lungs every 6 (six) hours as needed for wheezing or shortness of breath.   ALPRAZolam 0.25 MG tablet Commonly known as:  XANAX TAKE ONE TABLET BY MOUTH 3 TIMES DAILY AS NEEDED FOR SLEEP OR ANXIETY   conjugated estrogens vaginal cream Commonly known as:  PREMARIN Place 1 Applicatorful vaginally every other day.   doxycycline 100 MG tablet Commonly known as:  VIBRA-TABS Take 1 tablet (100 mg total) by mouth 2 (two) times daily.   HYDROcodone-acetaminophen 10-325 MG tablet Commonly known as:  NORCO Take 1 tablet by mouth every 6 (six) hours as needed.     HYDROcodone-homatropine 5-1.5 MG/5ML syrup Commonly known as:  HYCODAN Take 5 mLs by mouth at bedtime as needed.   hydrocortisone 25 MG suppository Commonly known as:  ANUSOL-HC Place 1 suppository (25 mg total) rectally at bedtime.   hydrocortisone-pramoxine 2.5-1 % rectal cream Commonly known as:  ANALPRAM HC Place 1 application rectally 2 (  two) times daily. For 10 days   hydrOXYzine 25 MG tablet Commonly known as:  ATARAX/VISTARIL Take 25 mg by mouth at bedtime as needed.   latanoprost 0.005 % ophthalmic solution Commonly known as:  XALATAN Apply to eye.   latanoprost 0.005 % ophthalmic solution Commonly known as:  XALATAN   levothyroxine 100 MCG tablet Commonly known as:  SYNTHROID, LEVOTHROID Take 1 tablet (100 mcg total) by mouth daily before breakfast.   losartan 25 MG tablet Commonly known as:  COZAAR Take 1 tablet (25 mg total) by mouth daily.   methylphenidate 30 MG 24 hr capsule Commonly known as:  RITALIN LA Take 1 capsule (30 mg total) by mouth every morning. November  2017 rx   methylphenidate 10 MG tablet Commonly known as:  RITALIN Take 1 tablet (10 mg total) by mouth daily. December 2017   metoprolol tartrate 25 MG tablet Commonly known as:  LOPRESSOR Take 0.5 tablets (12.5 mg total) by mouth 2 (two) times daily.   nitroGLYCERIN 0.4 MG SL tablet Commonly known as:  NITROSTAT Place 1 tablet (0.4 mg total) under the tongue every 5 (five) minutes as needed for chest pain (max of 3 tabs, to ER if pain persists).   ondansetron 4 MG tablet Commonly known as:  ZOFRAN Take 1 tablet (4 mg total) by mouth every 4 (four) hours as needed for nausea or vomiting.   ondansetron 4 MG tablet Commonly known as:  ZOFRAN TAKE ONE TABLET BY MOUTH EVERY 4 HOURS AS NEEDED FOR NAUSEA OR VOMITING   predniSONE 50 MG tablet Commonly known as:  DELTASONE Take 1 tablet (50 mg total) by mouth daily with breakfast.   pseudoephedrine 120 MG 12 hr tablet Commonly known  as:  SUDAFED 12 HOUR Take 1 tablet (120 mg total) by mouth 2 (two) times daily.   ranitidine 150 MG tablet Commonly known as:  ZANTAC Take 1 tablet (150 mg total) by mouth 2 (two) times daily.   sucralfate 1 g tablet Commonly known as:  CARAFATE TAKE ONE TABLET BY MOUTH FOUR (4) TIMES DAILY WITH MEALS AT BEDTIME   SUMAtriptan 50 MG tablet Commonly known as:  IMITREX Take 1 tablet (50 mg total) by mouth once. May repeat in 2 hours if headache persists or recurs.   venlafaxine XR 75 MG 24 hr capsule Commonly known as:  EFFEXOR XR Take 1 capsule (75 mg total) by mouth 2 (two) times daily.   vitamin A 8000 UNIT capsule Take 8,000 Units by mouth daily.   Vitamin D3 2000 units Tabs Take by mouth.       Results for orders placed or performed in visit on 03/19/16 (from the past 24 hour(s))  POC Influenza A&B (Binax test)     Status: Normal   Collection Time: 03/19/16  2:05 PM  Result Value Ref Range   Influenza A, POC Negative Negative   Influenza B, POC Negative Negative   No results found.   ROS: Negative, with the exception of above mentioned in HPI   Objective:  BP 110/74 (BP Location: Right Arm, Patient Position: Sitting, Cuff Size: Normal)   Pulse (!) 58   Temp 98.2 F (36.8 C)   Resp 20   Wt 199 lb 8 oz (90.5 kg)   SpO2 99%   BMI 32.20 kg/m  Body mass index is 32.2 kg/m. Gen: Afebrile. No acute distress. Nontoxic in appearance, well developed, well nourished. Appears fatigued.  HENT: AT. [place]. Bilateral TM visualized bilateral fullness. MMM, no oral  lesions. Bilateral nares with mild erythema. Throat without erythema or exudates. No cough. Hoarseness present. Eyes:Pupils Equal Round Reactive to light, Extraocular movements intact,  Conjunctiva without redness, discharge or icterus. Neck/lymp/endocrine: Supple,no lymphadenopathy CV: RRR, no edema Chest: CTAB, no wheeze or crackles. Good air movement, normal resp effort.  Abd: Soft. NTND. BS present.  Skin: no  rashes, purpura or petechiae.  Neuro:  Normal gait. PERLA. EOMi. Alert. Oriented x3   Results for orders placed or performed in visit on 03/19/16 (from the past 24 hour(s))  POC Influenza A&B (Binax test)     Status: Normal   Collection Time: 03/19/16  2:05 PM  Result Value Ref Range   Influenza A, POC Negative Negative   Influenza B, POC Negative Negative      Assessment/Plan: Joan Robinson is a 56 y.o. female present for OV for  Rest, hydrate.  +/- flonase, mucinex (DM if cough), nettie pot or nasal saline.  doxycyline prescribed, to fill only if worsening  After Wednesday. If cough present it can last up to 6-8 weeks.  F/U 2 weeks of not improved.    Reviewed expectations re: course of current medical issues.  Discussed self-management of symptoms.  Outlined signs and symptoms indicating need for more acute intervention.  Patient verbalized understanding and all questions were answered.  Patient received an After-Visit Summary.   electronically signed by:  Felix Pacini, DO  [facility] [place]

## 2016-03-19 NOTE — Patient Instructions (Addendum)
Flu test negative today. This is likely viral right now and an antibiotic would not be helpful.  Rest, hydrate. Mucinex plain ok.  Tylenol ok.   If worsening or no improvement by Wednesday then have antibiotic filled.     Viral Illness, Adult Viruses are tiny germs that can get into a person's body and cause illness. There are many different types of viruses, and they cause many types of illness. Viral illnesses can range from mild to severe. They can affect various parts of the body. Common illnesses that are caused by a virus include colds and the flu. Viral illnesses also include serious conditions such as HIV/AIDS (human immunodeficiency virus/acquired immunodeficiency syndrome). A few viruses have been linked to certain cancers. What are the causes? Many types of viruses can cause illness. Viruses invade cells in your body, multiply, and cause the infected cells to malfunction or die. When the cell dies, it releases more of the virus. When this happens, you develop symptoms of the illness, and the virus continues to spread to other cells. If the virus takes over the function of the cell, it can cause the cell to divide and grow out of control, as is the case when a virus causes cancer. Different viruses get into the body in different ways. You can get a virus by:  Swallowing food or water that is contaminated with the virus.  Breathing in droplets that have been coughed or sneezed into the air by an infected person.  Touching a surface that has been contaminated with the virus and then touching your eyes, nose, or mouth.  Being bitten by an insect or animal that carries the virus.  Having sexual contact with a person who is infected with the virus.  Being exposed to blood or fluids that contain the virus, either through an open cut or during a transfusion. If a virus enters your body, your body's defense system (immune system) will try to fight the virus. You may be at higher risk for  a viral illness if your immune system is weak. What are the signs or symptoms? Symptoms vary depending on the type of virus and the location of the cells that it invades. Common symptoms of the main types of viral illnesses include: Cold and flu viruses   Fever.  Headache.  Sore throat.  Muscle aches.  Nasal congestion.  Cough. Digestive system (gastrointestinal) viruses   Fever.  Abdominal pain.  Nausea.  Diarrhea. Liver viruses (hepatitis)   Loss of appetite.  Tiredness.  Yellowing of the skin (jaundice). Brain and spinal cord viruses   Fever.  Headache.  Stiff neck.  Nausea and vomiting.  Confusion or sleepiness. Skin viruses   Warts.  Itching.  Rash. Sexually transmitted viruses   Discharge.  Swelling.  Redness.  Rash. How is this treated? Viruses can be difficult to treat because they live within cells. Antibiotic medicines do not treat viruses because these drugs do not get inside cells. Treatment for a viral illness may include:  Resting and drinking plenty of fluids.  Medicines to relieve symptoms. These can include over-the-counter medicine for pain and fever, medicines for cough or congestion, and medicines to relieve diarrhea.  Antiviral medicines. These drugs are available only for certain types of viruses. They may help reduce flu symptoms if taken early. There are also many antiviral medicines for hepatitis and HIV/AIDS. Some viral illnesses can be prevented with vaccinations. A common example is the flu shot. Follow these instructions at home: Medicines  Take over-the-counter and prescription medicines only as told by your health care provider.  If you were prescribed an antiviral medicine, take it as told by your health care provider. Do not stop taking the medicine even if you start to feel better.  Be aware of when antibiotics are needed and when they are not needed. Antibiotics do not treat viruses. If your health care  provider thinks that you may have a bacterial infection as well as a viral infection, you may get an antibiotic.  Do not ask for an antibiotic prescription if you have been diagnosed with a viral illness. That will not make your illness go away faster.  Frequently taking antibiotics when they are not needed can lead to antibiotic resistance. When this develops, the medicine no longer works against the bacteria that it normally fights. General instructions   Drink enough fluids to keep your urine clear or pale yellow.  Rest as much as possible.  Return to your normal activities as told by your health care provider. Ask your health care provider what activities are safe for you.  Keep all follow-up visits as told by your health care provider. This is important. How is this prevented? Take these actions to reduce your risk of viral infection:  Eat a healthy diet and get enough rest.  Wash your hands often with soap and water. This is especially important when you are in public places. If soap and water are not available, use hand sanitizer.  Avoid close contact with friends and family who have a viral illness.  If you travel to areas where viral gastrointestinal infection is common, avoid drinking water or eating raw food.  Keep your immunizations up to date. Get a flu shot every year as told by your health care provider.  Do not share toothbrushes, nail clippers, razors, or needles with other people.  Always practice safe sex. Contact a health care provider if:  You have symptoms of a viral illness that do not go away.  Your symptoms come back after going away.  Your symptoms get worse. Get help right away if:  You have trouble breathing.  You have a severe headache or a stiff neck.  You have severe vomiting or abdominal pain. This information is not intended to replace advice given to you by your health care provider. Make sure you discuss any questions you have with your  health care provider. Document Released: 05/13/2015 Document Revised: 06/15/2015 Document Reviewed: 05/13/2015 Elsevier Interactive Patient Education  2017 [employer].

## 2016-03-21 ENCOUNTER — Other Ambulatory Visit: Payer: Self-pay | Admitting: Family Medicine

## 2016-03-21 DIAGNOSIS — F32A Depression, unspecified: Secondary | ICD-10-CM

## 2016-03-21 DIAGNOSIS — F329 Major depressive disorder, single episode, unspecified: Secondary | ICD-10-CM

## 2016-03-22 MED ORDER — ALPRAZOLAM 0.25 MG PO TABS: ORAL_TABLET | ORAL | 0 refills | Status: DC

## 2016-03-22 MED ORDER — HYDROCODONE-ACETAMINOPHEN 10-325 MG PO TABS: 1.0000 | ORAL_TABLET | Freq: Four times a day (QID) | ORAL | 0 refills | Status: DC | PRN

## 2016-03-22 NOTE — Telephone Encounter (Signed)
Requesting:   Alprazolam and Hydrocodone Contract    03/16/2014 UDS   Moderate--due Last OV    11/15/2015  No scheduled upcoming Last Refill    Alprazolam-#30 with 0 refills on 03/07/2016                     Hydrocodone  #70 with 0 refills on 01/31/2016   Please Advise

## 2016-03-22 NOTE — Telephone Encounter (Signed)
I have signed but update uds and contract

## 2016-03-23 ENCOUNTER — Encounter: Payer: Self-pay | Admitting: Family Medicine

## 2016-03-23 MED FILL — HYDROCODON-APAP 10-325: 10-325 | 18 days supply | Qty: 70 | Fill #0

## 2016-03-23 NOTE — Telephone Encounter (Signed)
UDS was done on 03/06/16 and low risk per PCP, next due 08/16/2016 Contract done on 02/17/2016 These were updated and not seen in previous prescription requestr.

## 2016-03-23 NOTE — Telephone Encounter (Signed)
Printed contract/hardcopy

## 2016-03-23 NOTE — Telephone Encounter (Signed)
Patient came by and picked up hardcopy. Contract and UDS are up to date.

## 2016-03-23 NOTE — Telephone Encounter (Signed)
See previous message that contract and uds have been updated and did not need to do again.  Let me know ok to let patient pickup hardcopy today and ok not to have to do additional test/contract as up to date.

## 2016-03-25 NOTE — Telephone Encounter (Signed)
perfect

## 2016-04-18 ENCOUNTER — Other Ambulatory Visit: Payer: Self-pay | Admitting: Family Medicine

## 2016-04-18 DIAGNOSIS — Z1231 Encounter for screening mammogram for malignant neoplasm of breast: Secondary | ICD-10-CM

## 2016-04-20 ENCOUNTER — Telehealth: Payer: Self-pay | Admitting: Family Medicine

## 2016-04-20 MED ORDER — OSELTAMIVIR PHOSPHATE 75 MG PO CAPS: 75.0000 mg | ORAL_CAPSULE | Freq: Every day | ORAL | 0 refills | Status: DC

## 2016-04-20 NOTE — Telephone Encounter (Signed)
Caller name: Xianna Relationship to patient: self Can be reached: [phone] Pharmacy: [facility], [place] - [address]  Reason for call: daughter-in-law living with pt and just dx today with type b flu. Family was advised to call their PCP for Tamiflu RX. Please advise.

## 2016-04-20 NOTE — Telephone Encounter (Signed)
tamiflu 75 mg 1 po qd x 10 days

## 2016-04-20 NOTE — Telephone Encounter (Signed)
Sent in tamiflu and patient aware

## 2016-04-23 ENCOUNTER — Other Ambulatory Visit: Payer: Self-pay | Admitting: Family Medicine

## 2016-04-23 MED ORDER — HYDROCODONE-ACETAMINOPHEN 10-325 MG PO TABS: 1.0000 | ORAL_TABLET | Freq: Four times a day (QID) | ORAL | 0 refills | Status: DC | PRN

## 2016-04-25 MED FILL — HYDROCODON-APAP 10-325: 10-325 | 17 days supply | Qty: 70 | Fill #0

## 2016-04-27 ENCOUNTER — Encounter: Payer: Self-pay | Admitting: Family Medicine

## 2016-04-27 ENCOUNTER — Ambulatory Visit (INDEPENDENT_AMBULATORY_CARE_PROVIDER_SITE_OTHER): Payer: Managed Care, Other (non HMO) | Admitting: Family Medicine

## 2016-04-27 ENCOUNTER — Ambulatory Visit (INDEPENDENT_AMBULATORY_CARE_PROVIDER_SITE_OTHER): Payer: Managed Care, Other (non HMO)

## 2016-04-27 VITALS — BP 112/80 | HR 68 | Temp 98.1°F | Resp 16 | Ht 66.0 in | Wt 197.0 lb

## 2016-04-27 DIAGNOSIS — E6609 Other obesity due to excess calories: Secondary | ICD-10-CM | POA: Diagnosis not present

## 2016-04-27 DIAGNOSIS — I10 Essential (primary) hypertension: Secondary | ICD-10-CM

## 2016-04-27 DIAGNOSIS — Z1231 Encounter for screening mammogram for malignant neoplasm of breast: Secondary | ICD-10-CM | POA: Diagnosis not present

## 2016-04-27 DIAGNOSIS — K219 Gastro-esophageal reflux disease without esophagitis: Secondary | ICD-10-CM

## 2016-04-27 DIAGNOSIS — L603 Nail dystrophy: Secondary | ICD-10-CM

## 2016-04-27 DIAGNOSIS — D649 Anemia, unspecified: Secondary | ICD-10-CM

## 2016-04-27 DIAGNOSIS — F418 Other specified anxiety disorders: Secondary | ICD-10-CM | POA: Diagnosis not present

## 2016-04-27 DIAGNOSIS — E782 Mixed hyperlipidemia: Secondary | ICD-10-CM

## 2016-04-27 DIAGNOSIS — F988 Other specified behavioral and emotional disorders with onset usually occurring in childhood and adolescence: Secondary | ICD-10-CM | POA: Diagnosis not present

## 2016-04-27 DIAGNOSIS — E039 Hypothyroidism, unspecified: Secondary | ICD-10-CM

## 2016-04-27 DIAGNOSIS — G4730 Sleep apnea, unspecified: Secondary | ICD-10-CM

## 2016-04-27 DIAGNOSIS — F3170 Bipolar disorder, currently in remission, most recent episode unspecified: Secondary | ICD-10-CM | POA: Diagnosis not present

## 2016-04-27 DIAGNOSIS — R5383 Other fatigue: Secondary | ICD-10-CM | POA: Diagnosis not present

## 2016-04-27 HISTORY — DX: Sleep apnea, unspecified: G47.30

## 2016-04-27 LAB — COMPREHENSIVE METABOLIC PANEL
ALT: 17 U/L (ref 0–35)
AST: 18 U/L (ref 0–37)
Albumin: 4.6 g/dL (ref 3.5–5.2)
Alkaline Phosphatase: 76 U/L (ref 39–117)
BUN: 18 mg/dL (ref 6–23)
CHLORIDE: 104 meq/L (ref 96–112)
CO2: 29 meq/L (ref 19–32)
CREATININE: 0.93 mg/dL (ref 0.40–1.20)
Calcium: 9.5 mg/dL (ref 8.4–10.5)
GFR: 66.44 mL/min (ref >60.00)
Glucose, Bld: 95 mg/dL (ref 70–99)
POTASSIUM: 4.2 meq/L (ref 3.5–5.1)
Sodium: 139 mEq/L (ref 135–145)
Total Bilirubin: 0.4 mg/dL (ref 0.2–1.2)
Total Protein: 7.2 g/dL (ref 6.0–8.3)

## 2016-04-27 LAB — CBC
HEMATOCRIT: 36.9 % (ref 36.0–46.0)
Hemoglobin: 12.3 g/dL (ref 12.0–15.0)
MCHC: 33.2 g/dL (ref 30.0–36.0)
MCV: 86.6 fl (ref 78.0–100.0)
Platelets: 227 10*3/uL (ref 150.0–400.0)
RBC: 4.26 Mil/uL (ref 3.87–5.11)
RDW: 13 % (ref 11.5–15.5)
WBC: 5.8 10*3/uL (ref 4.0–10.5)

## 2016-04-27 LAB — LIPID PANEL
CHOL/HDL RATIO: 4
Cholesterol: 197 mg/dL (ref 0–200)
HDL: 46.9 mg/dL (ref >39.00)
LDL CALC: 116 mg/dL — AB (ref 0–99)
NonHDL: 149.93
Triglycerides: 170 mg/dL — ABNORMAL HIGH (ref 0.0–149.0)
VLDL: 34 mg/dL (ref 0.0–40.0)

## 2016-04-27 LAB — TSH: TSH: 0.83 u[IU]/mL (ref 0.35–4.50)

## 2016-04-27 NOTE — Patient Instructions (Signed)

## 2016-04-27 NOTE — Progress Notes (Signed)
Subjective:  I acted as a Education administrator for Dr. Ross Ludwig, LPN    Patient ID: Joan Robinson, female    DOB: 1961/01/05, 56 y.o.   MRN: [id number]  Chief Complaint  Patient presents with  . Hypertension    follow up  . Hypothyroidism    follow up    Hypertension  This is a chronic problem. The current episode started more than 1 year ago. Pertinent negatives include no blurred vision, chest pain, headaches or palpitations.    Patient is in today for follow up hypertension, and hypothyroidism. Patient report she is not taking Effexor, and Cozaar because her blood pressure has been running low. Patient would like to know why is her nails separating from her nail bed. Patient c/o fatigue for about a month. Patient report she has not been using the C-Pap at night over 10 years. Patient inquired about losing weight. Patient report she is taking otc lithium.  Patient has no further concerns. She feels well and does not have any acute concerns. No recent febrile illness or hospitlaizations. Denies CP/palp/SOB/HA/congestion/fevers/GI or GU c/o. Taking meds as prescribed  Patient Care Team: Mosie Lukes, MD as PCP - General (Family Medicine) Hayden Pedro, MD as Consulting Physician (Ophthalmology) Olga Coaster, MD (Ophthalmology) Dixie Dials, MD as Consulting Physician (Cardiology) Hennie Duos, MD as Consulting Physician (Rheumatology)   Past Medical History:  Diagnosis Date  . Adenomatous colon polyp   . Allergic rhinitis   . Anemia 10/10/2011  . Anxiety and depression 01/17/2007   Qualifier: Diagnosis of  By: Loanne Drilling MD, Jacelyn Pi   . Arthritis 07/24/2013   Likely inflammatory and following with Dr Lynnda Shields of [facility]  rheumatology  . 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 BCC (basal cell carcinoma of skin) 06/01/2012   Leg Follows with Dr Nevada Crane  . Bipolar disorder ([place]) 01/17/2007   Qualifier: Diagnosis of  By: Loanne Drilling MD, Jacelyn Pi   . Chicken pox as a child   X 2  . Colitis, Clostridium  difficile 5/11,6/11  . Colon polyp    benign  . Diverticulosis   . Diverticulosis   . 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 Headache(784.0) 08/17/2012  . Hyperlipidemia   . Hypertension   . Hypothyroidism   . Hypothyroidism 08/24/2006   Qualifier: Diagnosis of  By: Loanne Drilling MD, Jacelyn Pi    . Lower back pain   . 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 Mumps as a child  . Obesity 11/01/2013  . Perimenopausal 01/16/2012  . Preventative health care 10/10/2011  . PUD (peptic ulcer disease)   . Right knee pain 05/10/2012  . Sinusitis acute 10/10/2011  . Sleep apnea 04/27/2016  . Staph aureus infection 12/18/2011   Recurrent lesions in nares  . Tobacco abuse   . Urinary incontinence     Past Surgical History:  Procedure Laterality Date  . ABDOMINAL HYSTERECTOMY    . ABDOMINAL HYSTERECTOMY  2011   complete  . COLONOSCOPY    . DILATION AND CURETTAGE OF UTERUS  1985  . EYE SURGERY  03/03/14   Surgery on right eye for epiretinal membrane (vitreous peel)   . Gated Spect wall motion stress cardiolite  11/05/2001  . POLYPECTOMY    . TUBAL LIGATION  1995  . UTERINE SUSPENSION     mesh  . VITRECTOMY Bilateral 03/03/14    Family History  Problem Relation Age of Onset  . Heart disease Mother  and regular rhythm.   Pulmonary/Chest: Effort normal and breath sounds normal. She has no wheezes.  Abdominal: Soft. Bowel sounds are normal. There is  no tenderness.  Musculoskeletal: Normal range of motion. She exhibits no edema or deformity.  Neurological: She is alert and oriented to person, place, and time.  Skin: Skin is warm and dry. She is not diaphoretic.  Nail pulling away from nail bed in one nail  Psychiatric: She has a normal mood and affect.    BP 112/80 (BP Location: Right Arm, Patient Position: Sitting, Cuff Size: Large)   Pulse 68   Temp 98.1 F (36.7 C) (Oral)   Resp 16   Ht 5\' 6"  (1.676 m)   Wt 197 lb (89.4 kg)   SpO2 98%   BMI 31.80 kg/m  Wt Readings from Last 3 Encounters:  04/27/16 197 lb (89.4 kg)  03/19/16 199 lb 8 oz (90.5 kg)  02/07/16 207 lb 4 oz (94 kg)   BP Readings from Last 3 Encounters:  04/27/16 112/80  03/19/16 110/74  02/07/16 119/77     Immunization History  Administered Date(s) Administered  . Influenza Split 10/16/2010, 10/16/2011, 10/25/2015  . Influenza-Unspecified 11/07/2012, 10/21/2013, 10/19/2014  . Pneumococcal Conjugate-13 01/23/2013  . Td 07/04/2004  . Tdap 12/28/2014    Health Maintenance  Topic Date Due  . Hepatitis C Screening  1960-03-25  . HIV Screening  12/16/1975  . PAP SMEAR  01/15/2014  . INFLUENZA VACCINE  08/15/2016  . MAMMOGRAM  01/27/2017  . COLONOSCOPY  03/19/2017  . TETANUS/TDAP  12/27/2024    Lab Results  Component Value Date   WBC 5.8 04/27/2016   HGB 12.3 04/27/2016   HCT 36.9 04/27/2016   PLT 227.0 04/27/2016   GLUCOSE 95 04/27/2016   CHOL 197 04/27/2016   TRIG 170.0 (H) 04/27/2016   HDL 46.90 04/27/2016   LDLDIRECT 104.0 04/08/2015   LDLCALC 116 (H) 04/27/2016   ALT 17 04/27/2016   AST 18 04/27/2016   NA 139 04/27/2016   K 4.2 04/27/2016   CL 104 04/27/2016   CREATININE 0.93 04/27/2016   BUN 18 04/27/2016   CO2 29 04/27/2016   TSH 0.83 04/27/2016   INR 1.0 09/02/2008   HGBA1C 5.4 09/02/2015    Lab Results  Component Value Date   TSH 0.83 04/27/2016   Lab Results  Component Value Date   WBC 5.8 04/27/2016   HGB 12.3  04/27/2016   HCT 36.9 04/27/2016   MCV 86.6 04/27/2016   PLT 227.0 04/27/2016   Lab Results  Component Value Date   NA 139 04/27/2016   K 4.2 04/27/2016   CO2 29 04/27/2016   GLUCOSE 95 04/27/2016   BUN 18 04/27/2016   CREATININE 0.93 04/27/2016   BILITOT 0.4 04/27/2016   ALKPHOS 76 04/27/2016   AST 18 04/27/2016   ALT 17 04/27/2016   PROT 7.2 04/27/2016   ALBUMIN 4.6 04/27/2016   CALCIUM 9.5 04/27/2016   GFR 66.44 04/27/2016   Lab Results  Component Value Date   CHOL 197 04/27/2016   Lab Results  Component Value Date   HDL 46.90 04/27/2016   Lab Results  Component Value Date   LDLCALC 116 (H) 04/27/2016   Lab Results  Component Value Date   TRIG 170.0 (H) 04/27/2016   Lab Results  Component Value Date   CHOLHDL 4 04/27/2016   Lab Results  Component Value Date   HGBA1C 5.4 09/02/2015         Assessment & Plan:  Problem List Items Addressed This Visit    Hypothyroidism    On Levothyroxine, continue to monitor      Relevant Orders   TSH (Completed)   Bipolar disorder ([place])    Doing well on current meds      Essential hypertension - Primary    Well controlled, no changes to meds. Encouraged heart healthy diet such as the DASH diet and exercise as tolerated.       Relevant Orders   CBC (Completed)   Comprehensive metabolic panel (Completed)   TSH (Completed)   Depression with anxiety    Has been on Lithium 10 mg OTC? Per patient and she feels better. Stopped Effexor and feels well       GERD (Chronic)    Avoid offending foods, take probiotics. Do not eat large meals in late evening and consider raising head of bed.       Anemia    Increase leafy greens, consider increased lean red meat and using cast iron cookware. Continue to monitor, report any concerns      ADD (attention deficit disorder)    Ritalin is helping at work      Obesity    Encouraged DASH diet, decrease po intake and increase exercise as tolerated. Needs 7-8 hours of  sleep nightly. Avoid trans fats, eat small, frequent meals every 4-5 hours with lean proteins, complex carbs and healthy fats. Minimize simple carbs, sent to bariatrics       Fatigue   Sleep apnea    Not treating due to trouble with machine, referred back to pulmonology for new study it has been 10 years      Relevant Orders   Ambulatory referral to Pulmonology    Other Visit Diagnoses    Mixed hyperlipidemia       Relevant Orders   Lipid panel (Completed)   Nail dystrophy       Relevant Orders   Zinc      I have discontinued Ms. Welte's hydrOXYzine, venlafaxine XR, losartan, doxycycline, and oseltamivir. I am also having her maintain her vitamin A, Vitamin D3, hydrocortisone, hydrocortisone-pramoxine, SUMAtriptan, nitroGLYCERIN, albuterol, levothyroxine, conjugated estrogens, ranitidine, methylphenidate, methylphenidate, HYDROcodone-homatropine, pseudoephedrine, latanoprost, predniSONE, metoprolol tartrate, sucralfate, ondansetron, ALPRAZolam, and HYDROcodone-acetaminophen.  No orders of the defined types were placed in this encounter.   CMA served as Education administrator during this visit. History, Physical and Plan performed by medical provider. Documentation and orders reviewed and attested to.  Penni Homans, MD  Patient ID: Jomarie Longs [place], female   DOB: 1960/11/01, 56 y.o.   MRN: [id number]  Subjective:  I acted as a Education administrator for Dr. Ross Ludwig, LPN    Patient ID: Joan Robinson, female    DOB: 1961/01/05, 56 y.o.   MRN: [id number]  Chief Complaint  Patient presents with  . Hypertension    follow up  . Hypothyroidism    follow up    Hypertension  This is a chronic problem. The current episode started more than 1 year ago. Pertinent negatives include no blurred vision, chest pain, headaches or palpitations.    Patient is in today for follow up hypertension, and hypothyroidism. Patient report she is not taking Effexor, and Cozaar because her blood pressure has been running low. Patient would like to know why is her nails separating from her nail bed. Patient c/o fatigue for about a month. Patient report she has not been using the C-Pap at night over 10 years. Patient inquired about losing weight. Patient report she is taking otc lithium.  Patient has no further concerns. She feels well and does not have any acute concerns. No recent febrile illness or hospitlaizations. Denies CP/palp/SOB/HA/congestion/fevers/GI or GU c/o. Taking meds as prescribed  Patient Care Team: Mosie Lukes, MD as PCP - General (Family Medicine) Hayden Pedro, MD as Consulting Physician (Ophthalmology) Olga Coaster, MD (Ophthalmology) Dixie Dials, MD as Consulting Physician (Cardiology) Hennie Duos, MD as Consulting Physician (Rheumatology)   Past Medical History:  Diagnosis Date  . Adenomatous colon polyp   . Allergic rhinitis   . Anemia 10/10/2011  . Anxiety and depression 01/17/2007   Qualifier: Diagnosis of  By: Loanne Drilling MD, Jacelyn Pi   . Arthritis 07/24/2013   Likely inflammatory and following with Dr Lynnda Shields of [facility]  rheumatology  . 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 BCC (basal cell carcinoma of skin) 06/01/2012   Leg Follows with Dr Nevada Crane  . Bipolar disorder ([place]) 01/17/2007   Qualifier: Diagnosis of  By: Loanne Drilling MD, Jacelyn Pi   . Chicken pox as a child   X 2  . Colitis, Clostridium  difficile 5/11,6/11  . Colon polyp    benign  . Diverticulosis   . Diverticulosis   . 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 Headache(784.0) 08/17/2012  . Hyperlipidemia   . Hypertension   . Hypothyroidism   . Hypothyroidism 08/24/2006   Qualifier: Diagnosis of  By: Loanne Drilling MD, Jacelyn Pi    . Lower back pain   . 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 Mumps as a child  . Obesity 11/01/2013  . Perimenopausal 01/16/2012  . Preventative health care 10/10/2011  . PUD (peptic ulcer disease)   . Right knee pain 05/10/2012  . Sinusitis acute 10/10/2011  . Sleep apnea 04/27/2016  . Staph aureus infection 12/18/2011   Recurrent lesions in nares  . Tobacco abuse   . Urinary incontinence     Past Surgical History:  Procedure Laterality Date  . ABDOMINAL HYSTERECTOMY    . ABDOMINAL HYSTERECTOMY  2011   complete  . COLONOSCOPY    . DILATION AND CURETTAGE OF UTERUS  1985  . EYE SURGERY  03/03/14   Surgery on right eye for epiretinal membrane (vitreous peel)   . Gated Spect wall motion stress cardiolite  11/05/2001  . POLYPECTOMY    . TUBAL LIGATION  1995  . UTERINE SUSPENSION     mesh  . VITRECTOMY Bilateral 03/03/14    Family History  Problem Relation Age of Onset  . Heart disease Mother  and regular rhythm.   Pulmonary/Chest: Effort normal and breath sounds normal. She has no wheezes.  Abdominal: Soft. Bowel sounds are normal. There is  no tenderness.  Musculoskeletal: Normal range of motion. She exhibits no edema or deformity.  Neurological: She is alert and oriented to person, place, and time.  Skin: Skin is warm and dry. She is not diaphoretic.  Nail pulling away from nail bed in one nail  Psychiatric: She has a normal mood and affect.    BP 112/80 (BP Location: Right Arm, Patient Position: Sitting, Cuff Size: Large)   Pulse 68   Temp 98.1 F (36.7 C) (Oral)   Resp 16   Ht 5\' 6"  (1.676 m)   Wt 197 lb (89.4 kg)   SpO2 98%   BMI 31.80 kg/m  Wt Readings from Last 3 Encounters:  04/27/16 197 lb (89.4 kg)  03/19/16 199 lb 8 oz (90.5 kg)  02/07/16 207 lb 4 oz (94 kg)   BP Readings from Last 3 Encounters:  04/27/16 112/80  03/19/16 110/74  02/07/16 119/77     Immunization History  Administered Date(s) Administered  . Influenza Split 10/16/2010, 10/16/2011, 10/25/2015  . Influenza-Unspecified 11/07/2012, 10/21/2013, 10/19/2014  . Pneumococcal Conjugate-13 01/23/2013  . Td 07/04/2004  . Tdap 12/28/2014    Health Maintenance  Topic Date Due  . Hepatitis C Screening  1960-03-25  . HIV Screening  12/16/1975  . PAP SMEAR  01/15/2014  . INFLUENZA VACCINE  08/15/2016  . MAMMOGRAM  01/27/2017  . COLONOSCOPY  03/19/2017  . TETANUS/TDAP  12/27/2024    Lab Results  Component Value Date   WBC 5.8 04/27/2016   HGB 12.3 04/27/2016   HCT 36.9 04/27/2016   PLT 227.0 04/27/2016   GLUCOSE 95 04/27/2016   CHOL 197 04/27/2016   TRIG 170.0 (H) 04/27/2016   HDL 46.90 04/27/2016   LDLDIRECT 104.0 04/08/2015   LDLCALC 116 (H) 04/27/2016   ALT 17 04/27/2016   AST 18 04/27/2016   NA 139 04/27/2016   K 4.2 04/27/2016   CL 104 04/27/2016   CREATININE 0.93 04/27/2016   BUN 18 04/27/2016   CO2 29 04/27/2016   TSH 0.83 04/27/2016   INR 1.0 09/02/2008   HGBA1C 5.4 09/02/2015    Lab Results  Component Value Date   TSH 0.83 04/27/2016   Lab Results  Component Value Date   WBC 5.8 04/27/2016   HGB 12.3  04/27/2016   HCT 36.9 04/27/2016   MCV 86.6 04/27/2016   PLT 227.0 04/27/2016   Lab Results  Component Value Date   NA 139 04/27/2016   K 4.2 04/27/2016   CO2 29 04/27/2016   GLUCOSE 95 04/27/2016   BUN 18 04/27/2016   CREATININE 0.93 04/27/2016   BILITOT 0.4 04/27/2016   ALKPHOS 76 04/27/2016   AST 18 04/27/2016   ALT 17 04/27/2016   PROT 7.2 04/27/2016   ALBUMIN 4.6 04/27/2016   CALCIUM 9.5 04/27/2016   GFR 66.44 04/27/2016   Lab Results  Component Value Date   CHOL 197 04/27/2016   Lab Results  Component Value Date   HDL 46.90 04/27/2016   Lab Results  Component Value Date   LDLCALC 116 (H) 04/27/2016   Lab Results  Component Value Date   TRIG 170.0 (H) 04/27/2016   Lab Results  Component Value Date   CHOLHDL 4 04/27/2016   Lab Results  Component Value Date   HGBA1C 5.4 09/02/2015         Assessment & Plan:

## 2016-04-27 NOTE — Assessment & Plan Note (Addendum)
Has been on Lithium 10 mg OTC? Per patient and she feels better. Stopped Effexor and feels well

## 2016-04-27 NOTE — Assessment & Plan Note (Signed)
Encouraged DASH diet, decrease po intake and increase exercise as tolerated. Needs 7-8 hours of sleep nightly. Avoid trans fats, eat small, frequent meals every 4-5 hours with lean proteins, complex carbs and healthy fats. Minimize simple carbs, sent to bariatrics

## 2016-04-27 NOTE — Assessment & Plan Note (Signed)
Ritalin is helping at work

## 2016-04-27 NOTE — Assessment & Plan Note (Signed)
Increase leafy greens, consider increased lean red meat and using cast iron cookware. Continue to monitor, report any concerns 

## 2016-04-27 NOTE — Assessment & Plan Note (Signed)
On Levothyroxine, continue to monitor 

## 2016-04-27 NOTE — Progress Notes (Signed)
Pre visit review using our clinic review tool, if applicable. No additional management support is needed unless otherwise documented below in the visit note. 

## 2016-04-27 NOTE — Assessment & Plan Note (Signed)
Not treating due to trouble with machine, referred back to pulmonology for new study it has been 10 years

## 2016-04-27 NOTE — Assessment & Plan Note (Signed)
Well controlled, no changes to meds. Encouraged heart healthy diet such as the DASH diet and exercise as tolerated.  °

## 2016-04-29 DIAGNOSIS — L603 Nail dystrophy: Secondary | ICD-10-CM | POA: Insufficient documentation

## 2016-04-29 NOTE — Assessment & Plan Note (Signed)
Avoid offending foods, take probiotics. Do not eat large meals in late evening and consider raising head of bed.  

## 2016-04-29 NOTE — Assessment & Plan Note (Signed)
Very mild, check labs and monitor

## 2016-04-29 NOTE — Assessment & Plan Note (Signed)
Doing well on current meds.

## 2016-05-01 ENCOUNTER — Other Ambulatory Visit: Payer: Self-pay | Admitting: Family Medicine

## 2016-05-01 ENCOUNTER — Ambulatory Visit: Payer: Managed Care, Other (non HMO) | Admitting: Family Medicine

## 2016-05-01 ENCOUNTER — Encounter: Payer: Self-pay | Admitting: Family Medicine

## 2016-05-01 ENCOUNTER — Other Ambulatory Visit (INDEPENDENT_AMBULATORY_CARE_PROVIDER_SITE_OTHER): Payer: Managed Care, Other (non HMO)

## 2016-05-01 DIAGNOSIS — M545 Low back pain: Secondary | ICD-10-CM

## 2016-05-01 DIAGNOSIS — R3915 Urgency of urination: Secondary | ICD-10-CM

## 2016-05-01 DIAGNOSIS — R300 Dysuria: Secondary | ICD-10-CM

## 2016-05-01 DIAGNOSIS — E60 Dietary zinc deficiency: Secondary | ICD-10-CM

## 2016-05-01 LAB — ZINC: ZINC: 57 ug/dL — AB (ref 60–130)

## 2016-05-01 MED ORDER — ZINC GLUCONATE 50 MG PO TABS: 50.0000 mg | ORAL_TABLET | Freq: Every day | ORAL | 2 refills | Status: DC

## 2016-05-01 MED ORDER — NITROFURANTOIN MONOHYD MACRO 100 MG PO CAPS: 100.0000 mg | ORAL_CAPSULE | Freq: Two times a day (BID) | ORAL | 0 refills | Status: DC

## 2016-05-02 ENCOUNTER — Other Ambulatory Visit: Payer: Self-pay | Admitting: *Deleted

## 2016-05-02 ENCOUNTER — Telehealth: Payer: Self-pay | Admitting: Family Medicine

## 2016-05-02 LAB — URINALYSIS, ROUTINE W REFLEX MICROSCOPIC
Bilirubin Urine: NEGATIVE
KETONES UR: NEGATIVE
Nitrite: POSITIVE — AB
PH: 5 (ref 5.0–8.0)
SPECIFIC GRAVITY, URINE: 1.01 (ref 1.000–1.030)
Total Protein, Urine: 30 — AB
URINE GLUCOSE: 100 — AB
UROBILINOGEN UA: 2 — AB (ref 0.0–1.0)

## 2016-05-02 MED ORDER — AMOXICILLIN-POT CLAVULANATE 875-125 MG PO TABS: 1.0000 | ORAL_TABLET | Freq: Two times a day (BID) | ORAL | 0 refills | Status: DC

## 2016-05-02 NOTE — Telephone Encounter (Signed)
Please advise. We sent in augmentin today based on urinalysis results, but macrobid was sent in yesterday in response to patient's MyChart message. Which would you like her to take?

## 2016-05-02 NOTE — Telephone Encounter (Signed)
Either is an option, since she is so nervous about the augmentin, can take the Macrobid til we get the sensitivities

## 2016-05-02 NOTE — Telephone Encounter (Signed)
Relation to pt: self  Call back number:[phone] Pharmacy: [facility] [place] - [phone] (Phone) [phone] (Fax)     Reason for call:  Patient states amoxicillin-clavulanate (AUGMENTIN) 875-125 MG tablet and nitrofurantoin, macrocrystal-monohydrate, (MACROBID) 100 MG capsule were sent to pharmacy and would like to know if she should take both prescriptions.

## 2016-05-03 NOTE — Telephone Encounter (Signed)
Sounds good, it is the stronger option if she is now having fevers.

## 2016-05-03 NOTE — Telephone Encounter (Signed)
Pt notified of instructions and verbalized understanding. She states she already started augmentin and will continue taking that with probiotic. She has started running a low grade fever. UC is back showing E. Coli UTI, susceptibility report is pending.

## 2016-05-04 LAB — URINE CULTURE

## 2016-05-06 ENCOUNTER — Encounter: Payer: Self-pay | Admitting: Family Medicine

## 2016-05-07 ENCOUNTER — Telehealth: Payer: Self-pay | Admitting: Family Medicine

## 2016-05-07 ENCOUNTER — Encounter: Payer: Self-pay | Admitting: Family Medicine

## 2016-05-07 ENCOUNTER — Ambulatory Visit (INDEPENDENT_AMBULATORY_CARE_PROVIDER_SITE_OTHER): Payer: Managed Care, Other (non HMO) | Admitting: Family Medicine

## 2016-05-07 VITALS — BP 125/71 | HR 68 | Temp 98.1°F | Wt 196.4 lb

## 2016-05-07 DIAGNOSIS — J309 Allergic rhinitis, unspecified: Secondary | ICD-10-CM

## 2016-05-07 DIAGNOSIS — I10 Essential (primary) hypertension: Secondary | ICD-10-CM | POA: Diagnosis not present

## 2016-05-07 DIAGNOSIS — N390 Urinary tract infection, site not specified: Secondary | ICD-10-CM | POA: Diagnosis not present

## 2016-05-07 DIAGNOSIS — E039 Hypothyroidism, unspecified: Secondary | ICD-10-CM | POA: Diagnosis not present

## 2016-05-07 DIAGNOSIS — N8110 Cystocele, unspecified: Secondary | ICD-10-CM

## 2016-05-07 NOTE — Progress Notes (Signed)
Pre visit review using our clinic review tool, if applicable. No additional management support is needed unless otherwise documented below in the visit note. 

## 2016-05-07 NOTE — Progress Notes (Signed)
Patient ID: Joan Robinson, female   DOB: 1960-01-31, 56 y.o.   MRN: [id number]   Subjective:  I acted as a Education administrator for [employer], [place], [place]   Patient ID: Joan Robinson, female    DOB: 1960/12/10, 56 y.o.   MRN: [id number]  Chief Complaint  Patient presents with  . Prolasped Bladder    HPI  Patient is in today for an acute visit. Patient suspects that she may have a prolapsed bladder. States that she was taking a shower when she felt something unusual. After getting out of the shower, she looked in her vaginal area and noticed something protruding. It is better now. Patient has a Hx of HTN, GERD, hypothyroidism, urinary incontinence. Patient has no additional acute concerns noted at this time. Notes some ongoing dysuria despite Augmentin. Is noting an increase in nasal congestion, cough and malaisae, no fevers or chills. No other concerns.   Patient Care Team: Mosie Lukes, MD as PCP - General (Family Medicine) Hayden Pedro, MD as Consulting Physician (Ophthalmology) Olga Coaster, MD (Ophthalmology) Dixie Dials, MD as Consulting Physician (Cardiology) Hennie Duos, MD as Consulting Physician (Rheumatology)   Past Medical History:  Diagnosis Date  . Adenomatous colon polyp   . Allergic rhinitis   . Anemia 10/10/2011  . Anxiety and depression 01/17/2007   Qualifier: Diagnosis of  By: Loanne Drilling MD, Jacelyn Pi   . Arthritis 07/24/2013   Likely inflammatory and following with Dr Lynnda Shields of [facility]  rheumatology  . 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 BCC (basal cell carcinoma of skin) 06/01/2012   Leg Follows with Dr [place] Crane  . Bipolar disorder ([place]) 01/17/2007   Qualifier: Diagnosis of  By: Loanne Drilling MD, Jacelyn Pi   . Chicken pox as a child   X 2  . Colitis, Clostridium difficile 5/11,6/11  . Colon polyp    benign  . Diverticulosis   . Diverticulosis   . 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 Headache(784.0) 08/17/2012  . Hyperlipidemia   .  Hypertension   . Hypothyroidism   . Hypothyroidism 08/24/2006   Qualifier: Diagnosis of  By: Loanne Drilling MD, Jacelyn Pi    . Lower back pain   . 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 Mumps as a child  . Obesity 11/01/2013  . Perimenopausal 01/16/2012  . Preventative health care 10/10/2011  . PUD (peptic ulcer disease)   . Right knee pain 05/10/2012  . Sinusitis acute 10/10/2011  . Sleep apnea 04/27/2016  . Staph aureus infection 12/18/2011   Recurrent lesions in nares  . Tobacco abuse   . Urinary incontinence     Past Surgical History:  Procedure Laterality Date  . ABDOMINAL HYSTERECTOMY    . ABDOMINAL HYSTERECTOMY  2011   complete  . COLONOSCOPY    . DILATION AND CURETTAGE OF UTERUS  1985  . EYE SURGERY  03/03/14   Surgery on right eye for epiretinal membrane (vitreous peel)   . Gated Spect wall motion stress cardiolite  11/05/2001  . POLYPECTOMY    . TUBAL LIGATION  1995  . UTERINE SUSPENSION     mesh  . VITRECTOMY Bilateral 03/03/14    Family History  Problem Relation Age of Onset  . Heart disease Mother     stents  . Stroke Mother   . Hyperlipidemia Mother   . Hypertension Mother   . Other Mother     blood disorder- mgus  . Colon polyps Father   .  oz (89.1 kg)   SpO2 97% Comment: RA  BMI 31.70  kg/m  Wt Readings from Last 3 Encounters:  05/07/16 196 lb 6.4 oz (89.1 kg)  04/27/16 197 lb (89.4 kg)  03/19/16 199 lb 8 oz (90.5 kg)   BP Readings from Last 3 Encounters:  05/07/16 125/71  04/27/16 112/80  03/19/16 110/74     Immunization History  Administered Date(s) Administered  . Influenza Split 10/16/2010, 10/16/2011, 10/25/2015  . Influenza-Unspecified 11/07/2012, 10/21/2013, 10/19/2014  . Pneumococcal Conjugate-13 01/23/2013  . Td 07/04/2004  . Tdap 12/28/2014    Health Maintenance  Topic Date Due  . Hepatitis C Screening  1960/07/17  . HIV Screening  12/16/1975  . PAP SMEAR  01/15/2014  . INFLUENZA VACCINE  08/15/2016  . COLONOSCOPY  03/19/2017  . MAMMOGRAM  04/28/2018  . TETANUS/TDAP  12/27/2024    Lab Results  Component Value Date   WBC 5.8 04/27/2016   HGB 12.3 04/27/2016   HCT 36.9 04/27/2016   PLT 227.0 04/27/2016   GLUCOSE 95 04/27/2016   CHOL 197 04/27/2016   TRIG 170.0 (H) 04/27/2016   HDL 46.90 04/27/2016   LDLDIRECT 104.0 04/08/2015   LDLCALC 116 (H) 04/27/2016   ALT 17 04/27/2016   AST 18 04/27/2016   NA 139 04/27/2016   K 4.2 04/27/2016   CL 104 04/27/2016   CREATININE 0.93 04/27/2016   BUN 18 04/27/2016   CO2 29 04/27/2016   TSH 0.83 04/27/2016   INR 1.0 09/02/2008   HGBA1C 5.4 09/02/2015    Lab Results  Component Value Date   TSH 0.83 04/27/2016   Lab Results  Component Value Date   WBC 5.8 04/27/2016   HGB 12.3 04/27/2016   HCT 36.9 04/27/2016   MCV 86.6 04/27/2016   PLT 227.0 04/27/2016   Lab Results  Component Value Date   NA 139 04/27/2016   K 4.2 04/27/2016   CO2 29 04/27/2016   GLUCOSE 95 04/27/2016   BUN 18 04/27/2016   CREATININE 0.93 04/27/2016   BILITOT 0.4 04/27/2016   ALKPHOS 76 04/27/2016   AST 18 04/27/2016   ALT 17 04/27/2016   PROT 7.2 04/27/2016   ALBUMIN 4.6 04/27/2016   CALCIUM 9.5 04/27/2016   GFR 66.44 04/27/2016   Lab Results  Component Value Date   CHOL 197 04/27/2016   Lab Results   Component Value Date   HDL 46.90 04/27/2016   Lab Results  Component Value Date   LDLCALC 116 (H) 04/27/2016   Lab Results  Component Value Date   TRIG 170.0 (H) 04/27/2016   Lab Results  Component Value Date   CHOLHDL 4 04/27/2016   Lab Results  Component Value Date   HGBA1C 5.4 09/02/2015         Assessment & Plan:   Problem List Items Addressed This Visit    Hypothyroidism    On Levothyroxine, continue to monitor      Essential hypertension    Well controlled, no changes to meds. Encouraged heart healthy diet such as the DASH diet and exercise as tolerated.       Allergic rhinitis    Encouraged bid antihistamines. flonase, nasal saline, add Aged garlic, mucinex, Zinc, elderberry and vitamin C if viral symptoms develop      UTI (urinary tract infection)    Is still having some dysuria despite Augmentin, finish course with azo and probiotics, increase hydration. Can take course of Macrobid if still symptomatic       Bladder prolapse,  Patient ID: Joan Robinson, female   DOB: 1960-01-31, 56 y.o.   MRN: [id number]   Subjective:  I acted as a Education administrator for [employer], [place], [place]   Patient ID: Joan Robinson, female    DOB: 1960/12/10, 56 y.o.   MRN: [id number]  Chief Complaint  Patient presents with  . Prolasped Bladder    HPI  Patient is in today for an acute visit. Patient suspects that she may have a prolapsed bladder. States that she was taking a shower when she felt something unusual. After getting out of the shower, she looked in her vaginal area and noticed something protruding. It is better now. Patient has a Hx of HTN, GERD, hypothyroidism, urinary incontinence. Patient has no additional acute concerns noted at this time. Notes some ongoing dysuria despite Augmentin. Is noting an increase in nasal congestion, cough and malaisae, no fevers or chills. No other concerns.   Patient Care Team: Mosie Lukes, MD as PCP - General (Family Medicine) Hayden Pedro, MD as Consulting Physician (Ophthalmology) Olga Coaster, MD (Ophthalmology) Dixie Dials, MD as Consulting Physician (Cardiology) Hennie Duos, MD as Consulting Physician (Rheumatology)   Past Medical History:  Diagnosis Date  . Adenomatous colon polyp   . Allergic rhinitis   . Anemia 10/10/2011  . Anxiety and depression 01/17/2007   Qualifier: Diagnosis of  By: Loanne Drilling MD, Jacelyn Pi   . Arthritis 07/24/2013   Likely inflammatory and following with Dr Lynnda Shields of [facility]  rheumatology  . 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 BCC (basal cell carcinoma of skin) 06/01/2012   Leg Follows with Dr [place] Crane  . Bipolar disorder ([place]) 01/17/2007   Qualifier: Diagnosis of  By: Loanne Drilling MD, Jacelyn Pi   . Chicken pox as a child   X 2  . Colitis, Clostridium difficile 5/11,6/11  . Colon polyp    benign  . Diverticulosis   . Diverticulosis   . 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 Headache(784.0) 08/17/2012  . Hyperlipidemia   .  Hypertension   . Hypothyroidism   . Hypothyroidism 08/24/2006   Qualifier: Diagnosis of  By: Loanne Drilling MD, Jacelyn Pi    . Lower back pain   . 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 Mumps as a child  . Obesity 11/01/2013  . Perimenopausal 01/16/2012  . Preventative health care 10/10/2011  . PUD (peptic ulcer disease)   . Right knee pain 05/10/2012  . Sinusitis acute 10/10/2011  . Sleep apnea 04/27/2016  . Staph aureus infection 12/18/2011   Recurrent lesions in nares  . Tobacco abuse   . Urinary incontinence     Past Surgical History:  Procedure Laterality Date  . ABDOMINAL HYSTERECTOMY    . ABDOMINAL HYSTERECTOMY  2011   complete  . COLONOSCOPY    . DILATION AND CURETTAGE OF UTERUS  1985  . EYE SURGERY  03/03/14   Surgery on right eye for epiretinal membrane (vitreous peel)   . Gated Spect wall motion stress cardiolite  11/05/2001  . POLYPECTOMY    . TUBAL LIGATION  1995  . UTERINE SUSPENSION     mesh  . VITRECTOMY Bilateral 03/03/14    Family History  Problem Relation Age of Onset  . Heart disease Mother     stents  . Stroke Mother   . Hyperlipidemia Mother   . Hypertension Mother   . Other Mother     blood disorder- mgus  . Colon polyps Father   .  Patient ID: Joan Robinson, female   DOB: 1960-01-31, 56 y.o.   MRN: [id number]   Subjective:  I acted as a Education administrator for [employer], [place], [place]   Patient ID: Joan Robinson, female    DOB: 1960/12/10, 56 y.o.   MRN: [id number]  Chief Complaint  Patient presents with  . Prolasped Bladder    HPI  Patient is in today for an acute visit. Patient suspects that she may have a prolapsed bladder. States that she was taking a shower when she felt something unusual. After getting out of the shower, she looked in her vaginal area and noticed something protruding. It is better now. Patient has a Hx of HTN, GERD, hypothyroidism, urinary incontinence. Patient has no additional acute concerns noted at this time. Notes some ongoing dysuria despite Augmentin. Is noting an increase in nasal congestion, cough and malaisae, no fevers or chills. No other concerns.   Patient Care Team: Mosie Lukes, MD as PCP - General (Family Medicine) Hayden Pedro, MD as Consulting Physician (Ophthalmology) Olga Coaster, MD (Ophthalmology) Dixie Dials, MD as Consulting Physician (Cardiology) Hennie Duos, MD as Consulting Physician (Rheumatology)   Past Medical History:  Diagnosis Date  . Adenomatous colon polyp   . Allergic rhinitis   . Anemia 10/10/2011  . Anxiety and depression 01/17/2007   Qualifier: Diagnosis of  By: Loanne Drilling MD, Jacelyn Pi   . Arthritis 07/24/2013   Likely inflammatory and following with Dr Lynnda Shields of [facility]  rheumatology  . 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 BCC (basal cell carcinoma of skin) 06/01/2012   Leg Follows with Dr [place] Crane  . Bipolar disorder ([place]) 01/17/2007   Qualifier: Diagnosis of  By: Loanne Drilling MD, Jacelyn Pi   . Chicken pox as a child   X 2  . Colitis, Clostridium difficile 5/11,6/11  . Colon polyp    benign  . Diverticulosis   . Diverticulosis   . 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 Headache(784.0) 08/17/2012  . Hyperlipidemia   .  Hypertension   . Hypothyroidism   . Hypothyroidism 08/24/2006   Qualifier: Diagnosis of  By: Loanne Drilling MD, Jacelyn Pi    . Lower back pain   . 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 Mumps as a child  . Obesity 11/01/2013  . Perimenopausal 01/16/2012  . Preventative health care 10/10/2011  . PUD (peptic ulcer disease)   . Right knee pain 05/10/2012  . Sinusitis acute 10/10/2011  . Sleep apnea 04/27/2016  . Staph aureus infection 12/18/2011   Recurrent lesions in nares  . Tobacco abuse   . Urinary incontinence     Past Surgical History:  Procedure Laterality Date  . ABDOMINAL HYSTERECTOMY    . ABDOMINAL HYSTERECTOMY  2011   complete  . COLONOSCOPY    . DILATION AND CURETTAGE OF UTERUS  1985  . EYE SURGERY  03/03/14   Surgery on right eye for epiretinal membrane (vitreous peel)   . Gated Spect wall motion stress cardiolite  11/05/2001  . POLYPECTOMY    . TUBAL LIGATION  1995  . UTERINE SUSPENSION     mesh  . VITRECTOMY Bilateral 03/03/14    Family History  Problem Relation Age of Onset  . Heart disease Mother     stents  . Stroke Mother   . Hyperlipidemia Mother   . Hypertension Mother   . Other Mother     blood disorder- mgus  . Colon polyps Father   .  Patient ID: Joan Robinson, female   DOB: 1960-01-31, 56 y.o.   MRN: [id number]   Subjective:  I acted as a Education administrator for [employer] Homans, [place], [place]   Patient ID: Joan Robinson, female    DOB: 1960/12/10, 56 y.o.   MRN: [id number]  Chief Complaint  Patient presents with  . Prolasped Bladder    HPI  Patient is in today for an acute visit. Patient suspects that she may have a prolapsed bladder. States that she was taking a shower when she felt something unusual. After getting out of the shower, she looked in her vaginal area and noticed something protruding. It is better now. Patient has a Hx of HTN, GERD, hypothyroidism, urinary incontinence. Patient has no additional acute concerns noted at this time. Notes some ongoing dysuria despite Augmentin. Is noting an increase in nasal congestion, cough and malaisae, no fevers or chills. No other concerns.   Patient Care Team: Mosie Lukes, MD as PCP - General (Family Medicine) Hayden Pedro, MD as Consulting Physician (Ophthalmology) Olga Coaster, MD (Ophthalmology) Dixie Dials, MD as Consulting Physician (Cardiology) Hennie Duos, MD as Consulting Physician (Rheumatology)   Past Medical History:  Diagnosis Date  . Adenomatous colon polyp   . Allergic rhinitis   . Anemia 10/10/2011  . Anxiety and depression 01/17/2007   Qualifier: Diagnosis of  By: Loanne Drilling MD, Jacelyn Pi   . Arthritis 07/24/2013   Likely inflammatory and following with Dr Lynnda Shields of [facility]  rheumatology  . 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 BCC (basal cell carcinoma of skin) 06/01/2012   Leg Follows with Dr [place] Crane  . Bipolar disorder ([place]) 01/17/2007   Qualifier: Diagnosis of  By: Loanne Drilling MD, Jacelyn Pi   . Chicken pox as a child   X 2  . Colitis, Clostridium difficile 5/11,6/11  . Colon polyp    benign  . Diverticulosis   . Diverticulosis   . 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 Headache(784.0) 08/17/2012  . Hyperlipidemia   .  Hypertension   . Hypothyroidism   . Hypothyroidism 08/24/2006   Qualifier: Diagnosis of  By: Loanne Drilling MD, Jacelyn Pi    . Lower back pain   . 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 Mumps as a child  . Obesity 11/01/2013  . Perimenopausal 01/16/2012  . Preventative health care 10/10/2011  . PUD (peptic ulcer disease)   . Right knee pain 05/10/2012  . Sinusitis acute 10/10/2011  . Sleep apnea 04/27/2016  . Staph aureus infection 12/18/2011   Recurrent lesions in nares  . Tobacco abuse   . Urinary incontinence     Past Surgical History:  Procedure Laterality Date  . ABDOMINAL HYSTERECTOMY    . ABDOMINAL HYSTERECTOMY  2011   complete  . COLONOSCOPY    . DILATION AND CURETTAGE OF UTERUS  1985  . EYE SURGERY  03/03/14   Surgery on right eye for epiretinal membrane (vitreous peel)   . Gated Spect wall motion stress cardiolite  11/05/2001  . POLYPECTOMY    . TUBAL LIGATION  1995  . UTERINE SUSPENSION     mesh  . VITRECTOMY Bilateral 03/03/14    Family History  Problem Relation Age of Onset  . Heart disease Mother     stents  . Stroke Mother   . Hyperlipidemia Mother   . Hypertension Mother   . Other Mother     blood disorder- mgus  . Colon polyps Father   .

## 2016-05-07 NOTE — Assessment & Plan Note (Signed)
Well controlled, no changes to meds. Encouraged heart healthy diet such as the DASH diet and exercise as tolerated.  °

## 2016-05-07 NOTE — Assessment & Plan Note (Signed)
Is still having some dysuria despite Augmentin, finish course with azo and probiotics, increase hydration. Can take course of Macrobid if still symptomatic

## 2016-05-07 NOTE — Assessment & Plan Note (Signed)
On Levothyroxine, continue to monitor 

## 2016-05-07 NOTE — Assessment & Plan Note (Signed)
Noted this am. Now improved but noted on exam. Has a history of TAH with bladder tack and mesh which has now failed. Will refer to urogynecology for further consideration.

## 2016-05-07 NOTE — Patient Instructions (Signed)
Pelvic Organ Prolapse Pelvic organ prolapse is the stretching, bulging, or dropping of pelvic organs into an abnormal position. It happens when the muscles and tissues that surround and support pelvic structures are stretched or weak. Pelvic organ prolapse can involve:  Vagina (vaginal prolapse).  Uterus (uterine prolapse).  Bladder (cystocele).  Rectum (rectocele).  Intestines (enterocele). When organs other than the vagina are involved, they often bulge into the vagina or protrude from the vagina, depending on how severe the prolapse is. What are the causes? Causes of this condition include:  Pregnancy, labor, and childbirth.  Long-lasting (chronic) cough.  Chronic constipation.  Obesity.  Past pelvic surgery.  Aging. During and after menopause, a decreased production of the hormone estrogen can weaken pelvic ligaments and muscles.  Consistently lifting more than 50 lb (23 kg).  Buildup of fluid in the abdomen due to certain diseases and other conditions. What are the signs or symptoms? Symptoms of this condition include:  Loss of bladder control when you cough, sneeze, strain, and exercise (stress incontinence). This may be worse immediately following childbirth, and it may gradually improve over time.  Feeling pressure in your pelvis or vagina. This pressure may increase when you cough or when you are having a bowel movement.  A bulge that protrudes from the opening of your vagina or against your vaginal wall. If your uterus protrudes through the opening of your vagina and rubs against your clothing, you may also experience soreness, ulcers, infection, pain, and bleeding.  Increased effort to have a bowel movement or urinate.  Pain in your low back.  Pain, discomfort, or disinterest in sexual intercourse.  Repeated bladder infections (urinary tract infections).  Difficulty inserting or inability to insert a tampon or applicator. In some people, this condition does  not cause any symptoms. How is this diagnosed? Your health care provider may perform an internal and external vaginal and rectal exam. During the exam, you may be asked to cough and strain while you are lying down, sitting, and standing up. Your health care provider will determine if other tests are required, such as bladder function tests. How is this treated? In most cases, this condition needs to be treated only if it produces symptoms. No treatment is guaranteed to correct the prolapse or relieve the symptoms completely. Treatment may include:  Lifestyle changes, such as:  Avoiding drinking beverages that contain caffeine.  Increasing your intake of high-fiber foods. This can help to decrease constipation and straining during bowel movements.  Emptying your bladder at scheduled times (bladder training therapy). This can help to reduce or avoid urinary incontinence.  Losing weight if you are overweight or obese.  Estrogen. Estrogen may help mild prolapse by increasing the strength and tone of pelvic floor muscles.  Kegel exercises. These may help mild cases of prolapse by strengthening and tightening the muscles of the pelvic floor.  Pessary insertion. A pessary is a soft, flexible device that is placed into your vagina by your health care provider to help support the vaginal walls and keep pelvic organs in place.  Surgery. This is often the only form of treatment for severe prolapse. Different types of surgeries are available. Follow these instructions at home:  Wear a sanitary pad or absorbent product if you have urinary incontinence.  Avoid heavy lifting and straining with exercise and work. Do not hold your breath when you perform mild to moderate lifting and exercise activities. Limit your activities as directed by your health care provider.  Take medicines   only as directed by your health care provider.  Perform Kegel exercises as directed by your health care provider.  If  you have a pessary, take care of it as directed by your health care provider. Contact a health care provider if:  Your symptoms interfere with your daily activities or sex life.  You need medicine to help with the discomfort.  You notice bleeding from the vagina that is not related to your period.  You have a fever.  You have pain or bleeding when you urinate.  You have bleeding when you have a bowel movement.  You lose urine when you have sex.  You have chronic constipation.  You have a pessary that falls out.  You have vaginal discharge that has a bad smell.  You have low abdominal pain or cramping that is unusual for you. This information is not intended to replace advice given to you by your health care provider. Make sure you discuss any questions you have with your health care provider. Document Released: 07/29/2013 Document Revised: 06/09/2015 Document Reviewed: 03/16/2013 Elsevier Interactive Patient Education  2017 [employer]  

## 2016-05-07 NOTE — Telephone Encounter (Signed)
Patient called this morning concerned she may have a prolapsed bladder Scheduled her this afternoon with PCP at 3. Informed to be here 15 mins early. Also if any symptoms worsen to go the the ER.

## 2016-05-07 NOTE — Assessment & Plan Note (Signed)
Encouraged bid antihistamines. flonase, nasal saline, add Aged garlic, mucinex, Zinc, elderberry and vitamin C if viral symptoms develop

## 2016-05-09 DIAGNOSIS — N9489 Other specified conditions associated with female genital organs and menstrual cycle: Secondary | ICD-10-CM | POA: Insufficient documentation

## 2016-05-09 DIAGNOSIS — M6289 Other specified disorders of muscle: Secondary | ICD-10-CM | POA: Insufficient documentation

## 2016-05-09 DIAGNOSIS — N952 Postmenopausal atrophic vaginitis: Secondary | ICD-10-CM | POA: Insufficient documentation

## 2016-05-09 DIAGNOSIS — N8111 Cystocele, midline: Secondary | ICD-10-CM | POA: Insufficient documentation

## 2016-05-15 ENCOUNTER — Telehealth: Payer: Self-pay | Admitting: Family Medicine

## 2016-05-15 DIAGNOSIS — F32A Depression, unspecified: Secondary | ICD-10-CM

## 2016-05-15 DIAGNOSIS — F329 Major depressive disorder, single episode, unspecified: Secondary | ICD-10-CM

## 2016-05-15 MED ORDER — ALPRAZOLAM 0.25 MG PO TABS: ORAL_TABLET | ORAL | 0 refills | Status: DC

## 2016-05-15 NOTE — Telephone Encounter (Signed)
Requesting:    Alprazolam Contract    02/28/2016 UDS    Moderate---due Last OV    05/07/2016 Last Refill    #30 no refills oon 03/22/2016  Please Advise--- pharmacy [place] fax number is [phone]

## 2016-05-15 NOTE — Telephone Encounter (Signed)
OK to refill

## 2016-05-15 NOTE — Telephone Encounter (Signed)
Faxed hardcopy to [facility]. (Alprazolam)

## 2016-05-23 ENCOUNTER — Other Ambulatory Visit: Payer: Self-pay | Admitting: Family Medicine

## 2016-05-23 DIAGNOSIS — F329 Major depressive disorder, single episode, unspecified: Secondary | ICD-10-CM

## 2016-05-23 DIAGNOSIS — F32A Depression, unspecified: Secondary | ICD-10-CM

## 2016-05-24 MED ORDER — METHYLPHENIDATE HCL ER (LA) 30 MG PO CP24: 30.0000 mg | ORAL_CAPSULE | ORAL | 0 refills | Status: DC

## 2016-05-24 MED ORDER — METHYLPHENIDATE HCL 10 MG PO TABS: 10.0000 mg | ORAL_TABLET | Freq: Every day | ORAL | 0 refills | Status: DC

## 2016-05-24 MED ORDER — ALPRAZOLAM 0.25 MG PO TABS: ORAL_TABLET | ORAL | 0 refills | Status: DC

## 2016-05-24 MED ORDER — HYDROCODONE-ACETAMINOPHEN 10-325 MG PO TABS: 1.0000 | ORAL_TABLET | Freq: Four times a day (QID) | ORAL | 0 refills | Status: DC | PRN

## 2016-05-25 MED FILL — METHYLPHENIDATE LA 30 MG CA: 30 | 30 days supply | Qty: 30 | Fill #0

## 2016-05-25 MED FILL — HYDROCODON-APAP 10-325: 10-325 | 18 days supply | Qty: 70 | Fill #0

## 2016-05-25 MED FILL — METHYLPHENIDATE 10 MG TAB: 10 | 30 days supply | Qty: 30 | Fill #0

## 2016-05-29 ENCOUNTER — Other Ambulatory Visit: Payer: Self-pay

## 2016-05-29 NOTE — Progress Notes (Unsigned)
Patient requested medications to be removed.    PC

## 2016-05-31 ENCOUNTER — Encounter: Payer: Self-pay | Admitting: Medical

## 2016-05-31 ENCOUNTER — Other Ambulatory Visit: Payer: Managed Care, Other (non HMO)

## 2016-05-31 ENCOUNTER — Other Ambulatory Visit: Payer: Self-pay | Admitting: Family Medicine

## 2016-05-31 ENCOUNTER — Ambulatory Visit (INDEPENDENT_AMBULATORY_CARE_PROVIDER_SITE_OTHER): Payer: Managed Care, Other (non HMO) | Admitting: Medical

## 2016-05-31 ENCOUNTER — Telehealth: Payer: Self-pay | Admitting: Family Medicine

## 2016-05-31 VITALS — BP 117/66 | HR 74 | Temp 98.2°F | Resp 16 | Ht 66.0 in | Wt 195.8 lb

## 2016-05-31 DIAGNOSIS — J40 Bronchitis, not specified as acute or chronic: Secondary | ICD-10-CM

## 2016-05-31 DIAGNOSIS — R05 Cough: Secondary | ICD-10-CM | POA: Diagnosis not present

## 2016-05-31 DIAGNOSIS — J301 Allergic rhinitis due to pollen: Secondary | ICD-10-CM

## 2016-05-31 DIAGNOSIS — J0100 Acute maxillary sinusitis, unspecified: Secondary | ICD-10-CM

## 2016-05-31 DIAGNOSIS — E039 Hypothyroidism, unspecified: Secondary | ICD-10-CM

## 2016-05-31 DIAGNOSIS — E60 Dietary zinc deficiency: Secondary | ICD-10-CM

## 2016-05-31 DIAGNOSIS — R059 Cough, unspecified: Secondary | ICD-10-CM

## 2016-05-31 MED ORDER — LEVOTHYROXINE SODIUM 100 MCG PO TABS: 100.0000 ug | ORAL_TABLET | Freq: Every day | ORAL | 6 refills | Status: DC

## 2016-05-31 MED ORDER — AZITHROMYCIN 250 MG PO TABS: ORAL_TABLET | ORAL | 0 refills | Status: DC

## 2016-05-31 NOTE — Patient Instructions (Addendum)
You appear to have bronchitis and sinusitis with allergic component. Rest hydrate and tylenol for fever. Use your hycodan or benzonatate for cough. Rx azithromycin  antibiotic. You can use sudafed for nasal congestion. For allergic component allegra or zyrtec.  If chest congestion worsens then would recommend chest xray.  Follow up in 7-10 days or as needed

## 2016-05-31 NOTE — Telephone Encounter (Signed)
Caller name: Relationship to patient: Self Can be reached: [phone]  Pharmacy:  [facility], [place] - [address] [phone] (Phone) [phone] (Fax)     Reason for call: Refill levothyroxine (SYNTHROID, LEVOTHROID) 100 MCG tablet [[id number]]

## 2016-05-31 NOTE — Telephone Encounter (Signed)
Called the patient informed thyroid medication sent to pharmacy Patient informed refill done to [facility].

## 2016-05-31 NOTE — Progress Notes (Signed)
Subjective:    Patient ID: Joan Robinson, female    DOB: 11-30-1960, 56 y.o.   MRN: [id number]  HPI  Pt in some scratchy throat, nasal congestion, mild ha, pnd and some sinus pressure. Symptoms for about 5-7 days.   Pt has started to cough up some dark mucous. Cough is moderate. Not keeping her up at night.  Pt has sneezed some.   Pt states take zyrtec or allegra every other day. Pt tried some sudafed and did not help much.  Pt is about to go out of town this weekend.  Review of Systems  Constitutional: Negative for chills, fatigue and fever.  HENT: Positive for congestion, postnasal drip, sinus pain, sinus pressure, sneezing and sore throat. Negative for ear pain.        Scratchy throat.  Respiratory: Positive for cough. Negative for chest tightness, shortness of breath and wheezing.   Cardiovascular: Negative for chest pain and palpitations.  Gastrointestinal: Negative for abdominal pain.  Musculoskeletal: Negative for back pain and joint swelling.  Neurological: Negative for dizziness and headaches.  Hematological: Positive for adenopathy. Does not bruise/bleed easily.       2 days ago submandibular nodes mild swollen.  Psychiatric/Behavioral: Negative for behavioral problems and confusion.    Past Medical History:  Diagnosis Date  . Adenomatous colon polyp   . Allergic rhinitis   . Anemia 10/10/2011  . Anxiety and depression 01/17/2007   Qualifier: Diagnosis of  By: Everardo All MD, Cleophas Dunker   . Arthritis 07/24/2013   Likely inflammatory and following with Dr Maryln Gottron of [facility]  rheumatology  . 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 BCC (basal cell carcinoma of skin) 06/01/2012   Leg Follows with Dr Margo Aye  . Bipolar disorder (HCC) 01/17/2007   Qualifier: Diagnosis of  By: Everardo All MD, Cleophas Dunker   . Chicken pox as a child   X 2  . Colitis, Clostridium difficile 5/11,6/11  . Colon polyp    benign  . Diverticulosis   . Diverticulosis   . 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 Headache(784.0) 08/17/2012  . Hyperlipidemia   . Hypertension   . Hypothyroidism   . Hypothyroidism 08/24/2006   Qualifier: Diagnosis of  By: Everardo All MD, Cleophas Dunker    . Lower back pain   . 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 Mumps as a child  . Obesity 11/01/2013  . Perimenopausal 01/16/2012  . Preventative health care 10/10/2011  . PUD (peptic ulcer disease)   . Right knee pain 05/10/2012  . Sinusitis acute 10/10/2011  . Sleep apnea 04/27/2016  . Staph aureus infection 12/18/2011   Recurrent lesions in nares  . Tobacco abuse   . Urinary incontinence      Social History   Social History  . Marital status: Married    Spouse name: N/A  . Number of children: 2  . Years of education: N/A   Occupational History  . 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 Smoking status: Former Smoker    Packs/day: 1.00    Years: 30.00    Types: Cigarettes    Quit date: 05/15/2009  . Smokeless tobacco: Never Used  . Alcohol use No  . Drug use: No  . Sexual activity: No   Other Topics Concern  . Not on file   Social History Narrative  . No narrative on file    Past Surgical History:  Procedure Laterality Date  . ABDOMINAL HYSTERECTOMY    .  ABDOMINAL HYSTERECTOMY  2011   complete  . COLONOSCOPY    . DILATION AND CURETTAGE OF UTERUS  1985  . EYE SURGERY  03/03/14   Surgery on right eye for epiretinal membrane (vitreous peel)   . Gated Spect wall motion stress cardiolite  11/05/2001  . POLYPECTOMY    . TUBAL LIGATION  1995  . UTERINE SUSPENSION     mesh  . VITRECTOMY Bilateral 03/03/14    Family History  Problem Relation Age of Onset  . Heart disease Mother        stents  . Stroke Mother   . Hyperlipidemia Mother   . Hypertension Mother   . Other Mother        blood disorder- mgus  . Colon polyps Father   . Other Father        aorta disection  . Aneurysm Father   . Irritable bowel syndrome Daughter   . Other Daughter        gastritis  .  Mental illness Daughter        bipolar and mood disorder  . Hypertension Sister        ?  Marland Kitchen Mental illness Son        bipolar  . Arthritis Sister   . Other Sister        thyroid  . Other Sister        thyroid  . Colon cancer Paternal Uncle     Allergies  Allergen Reactions  . Hydrocodone-Acetaminophen Anaphylaxis, Nausea Only and Palpitations  . Keflex [Cephalexin] Diarrhea  . Aspirin     REACTION: difficulty breathing  . Butamben-Tetracaine-Benzocaine     Mouth sores  . Dicyclomine Hcl     REACTION: mouth ulcers  . Metronidazole     REACTION: hives, mouth ulcers  . Moxifloxacin     REACTION: increased heart rate, nausea  . Nsaids     REACTION: difficulty breathing  . Phenergan [Promethazine Hcl]     "knocks" pt out for about 3 days  . Quetiapine     Somnolence. Slept for 36 hours straight.  . Sulfamethoxazole-Trimethoprim     REACTION: increased heart rate, nausea  . Cymbalta [Duloxetine Hcl] Rash  . Levothyroxine Palpitations  . Moxifloxacin Hcl In Nacl Palpitations  . Orphenadrine Citrate Palpitations    Current Outpatient Prescriptions on File Prior to Visit  Medication Sig Dispense Refill  . albuterol (PROVENTIL HFA;VENTOLIN HFA) 108 (90 Base) MCG/ACT inhaler Inhale 2 puffs into the lungs every 6 (six) hours as needed for wheezing or shortness of breath. 1 Inhaler 0  . ALPRAZolam (XANAX) 0.25 MG tablet TAKE ONE TABLET BY MOUTH 3 TIMES DAILY AS NEEDED FOR SLEEP OR ANXIETY 30 tablet 0  . Cholecalciferol (VITAMIN D3) 2000 UNITS TABS Take by mouth.    . conjugated estrogens (PREMARIN) vaginal cream Place 1 Applicatorful vaginally every other day. 30 g 5  . HYDROcodone-acetaminophen (NORCO) 10-325 MG tablet Take 1 tablet by mouth every 6 (six) hours as needed. 70 tablet 0  . HYDROcodone-homatropine (HYCODAN) 5-1.5 MG/5ML syrup Take 5 mLs by mouth at bedtime as needed. 50 mL 0  . hydrocortisone (ANUSOL-HC) 25 MG suppository Place 1 suppository (25 mg total) rectally  at bedtime. 12 suppository 0  . hydrocortisone-pramoxine (ANALPRAM HC) 2.5-1 % rectal cream Place 1 application rectally 2 (two) times daily. For 10 days 30 g 1  . latanoprost (XALATAN) 0.005 % ophthalmic solution     . levothyroxine (SYNTHROID, LEVOTHROID) 100 MCG tablet Take 1  tablet (100 mcg total) by mouth daily before breakfast. 30 tablet 6  . methylphenidate (RITALIN LA) 30 MG 24 hr capsule Take 1 capsule (30 mg total) by mouth every morning. November  2017 rx 30 capsule 0  . methylphenidate (RITALIN) 10 MG tablet Take 1 tablet (10 mg total) by mouth daily. December 2017 30 tablet 0  . metoprolol tartrate (LOPRESSOR) 25 MG tablet Take 0.5 tablets (12.5 mg total) by mouth 2 (two) times daily. 90 tablet 1  . nitroGLYCERIN (NITROSTAT) 0.4 MG SL tablet Place 1 tablet (0.4 mg total) under the tongue every 5 (five) minutes as needed for chest pain (max of 3 tabs, to ER if pain persists). 25 tablet 0  . ondansetron (ZOFRAN) 4 MG tablet TAKE ONE TABLET BY MOUTH EVERY 4 HOURS AS NEEDED FOR NAUSEA OR VOMITING 30 tablet 3  . ranitidine (ZANTAC) 150 MG tablet Take 1 tablet (150 mg total) by mouth 2 (two) times daily. 180 tablet 1  . sucralfate (CARAFATE) 1 g tablet TAKE ONE TABLET BY MOUTH FOUR (4) TIMES DAILY WITH MEALS AT BEDTIME 120 tablet 0  . vitamin A 8000 UNIT capsule Take 8,000 Units by mouth daily.    Marland Kitchen zinc gluconate 50 MG tablet Take 1 tablet (50 mg total) by mouth daily. 30 tablet 2   No current facility-administered medications on file prior to visit.     BP 117/66 (BP Location: Left Arm, Patient Position: Sitting, Cuff Size: Large)   Pulse 74   Temp 98.2 F (36.8 C) (Oral)   Resp 16   Ht 5\' 6"  (1.676 m)   Wt 195 lb 12.8 oz (88.8 kg)   SpO2 100%   BMI 31.60 kg/m       Objective:   Physical Exam  General  Mental Status - Alert. General Appearance - Well groomed. Not in acute distress.  Skin Rashes- No Rashes.  HEENT Head- Normal. Ear Auditory Canal - Left- Normal.  Right - Normal.Tympanic Membrane- Left- Normal. Right- Normal. Eye Sclera/Conjunctiva- Left- Normal. Right- Normal. Nose & Sinuses Nasal Mucosa- Left-  Boggy and Congested. Right-  Boggy and  Congested.Bilateral maxillary and frontal sinus pressure. Mouth & Throat Lips: Upper Lip- Normal: no dryness, cracking, pallor, cyanosis, or vesicular eruption. Lower Lip-Normal: no dryness, cracking, pallor, cyanosis or vesicular eruption. Buccal Mucosa- Bilateral- No Aphthous ulcers. Oropharynx- No Discharge or Erythema. Tonsils: Characteristics- Bilateral- No Erythema or Congestion. Size/Enlargement- Bilateral- No enlargement. Discharge- bilateral-None.  Neck Neck- Supple. No Masses.   Chest and Lung Exam Auscultation: Breath Sounds:-Clear even and unlabored.  Cardiovascular Auscultation:Rythm- Regular, rate and rhythm. Murmurs & Other Heart Sounds:Ausculatation of the heart reveal- No Murmurs.  Lymphatic Head & Neck General Head & Neck Lymphatics: Bilateral: Description- No Localized lymphadenopathy.       Assessment & Plan:   You appear to have bronchitis and sinusitis with allergic component. Rest hydrate and tylenol for fever. Use your hycodan or benzonatate for cough. Rx azithromycin  antibiotic. You can use sudafed for nasal congestion. For allergic component allegra or zyrtec.  If chest congestion worsens then would recommend chest xray.  Follow up in 7-10 days or as needed

## 2016-06-02 LAB — ZINC: ZINC: 72 ug/dL (ref 60–130)

## 2016-06-08 IMAGING — CR DG CHEST 2V
2 series · 2 of 2 positions shown · non-contrast
Comparison: Chest x-ray a 07/15/2008

CLINICAL DATA: Cough, congestion and shortness of breath.

EXAM:
CHEST  2 VIEW

[PA]
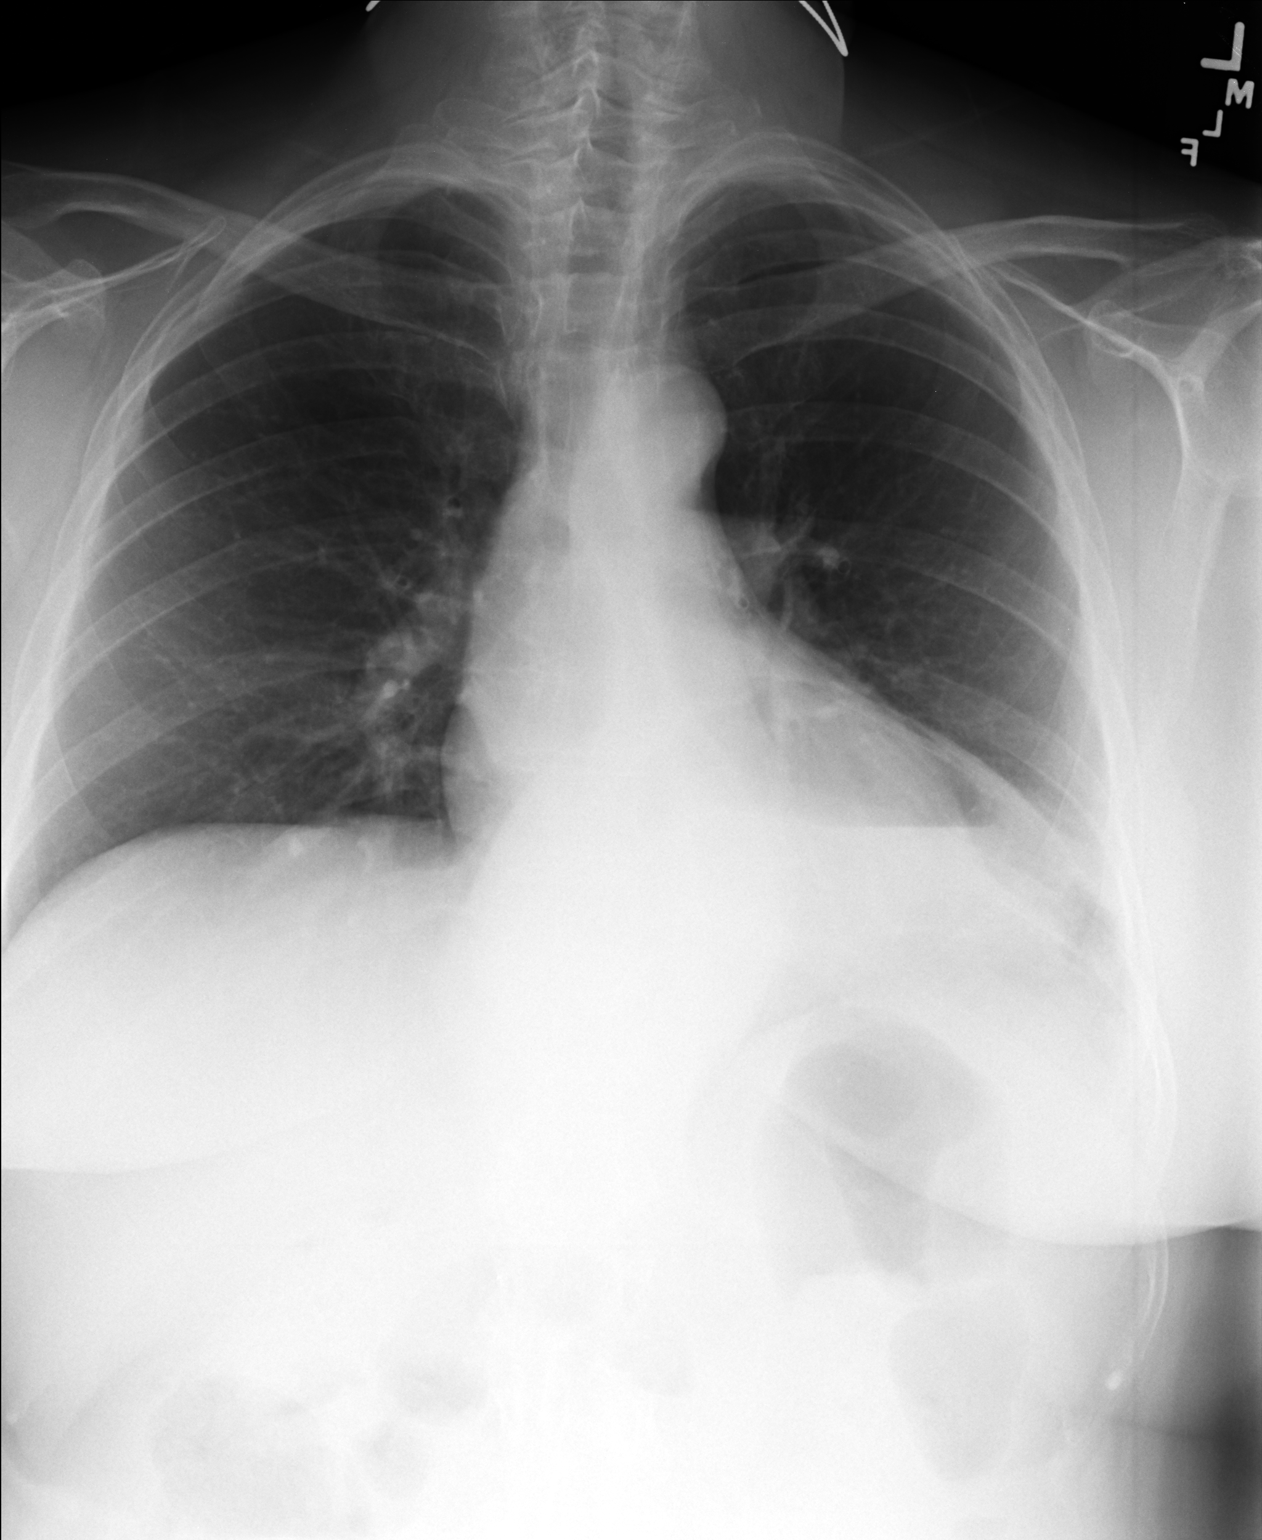

[lateral]
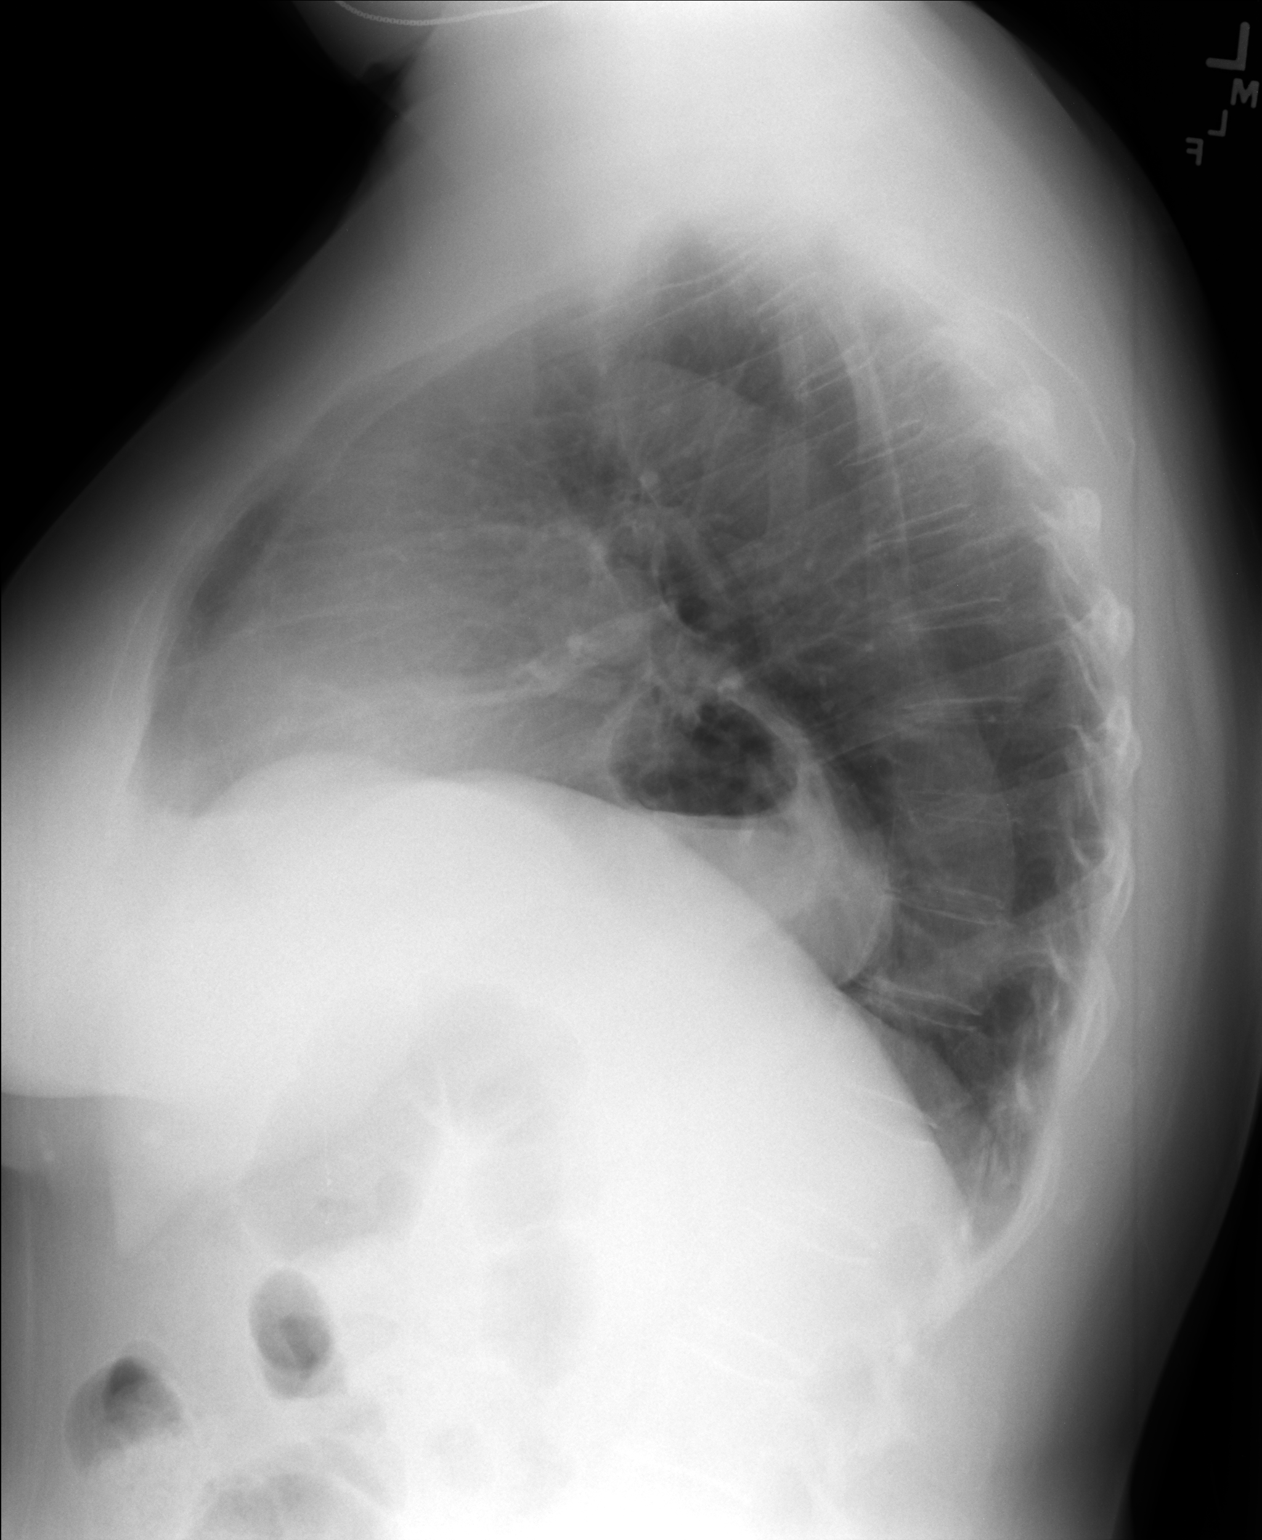

[2 of 2 positions shown; findings below may reference images not displayed]

FINDINGS: The heart is upper limits of normal in size. The mediastinal and
hilar contours appear normal. There is an enlarging hiatal hernia
with overlying left lower lobe lung atelectasis. No infiltrates,
edema or effusions. The bony thorax is intact.
IMPRESSION: Enlarging hiatal hernia with overlying left lower lobe atelectasis.

No infiltrates or effusions.

## 2016-06-12 ENCOUNTER — Other Ambulatory Visit: Payer: Self-pay | Admitting: Family Medicine

## 2016-06-12 DIAGNOSIS — R1013 Epigastric pain: Secondary | ICD-10-CM

## 2016-06-19 ENCOUNTER — Telehealth: Payer: Self-pay | Admitting: Family Medicine

## 2016-06-19 NOTE — Telephone Encounter (Signed)
PT REQ REFILL HYDROCODONE.

## 2016-06-19 NOTE — Telephone Encounter (Signed)
Requesting:   hydrocodone Contract    02/28/2016 UDS    Low--was due on 06/16/14 Last OV    05/07/2016----future appt is on 07/27/2016 Last Refill   #[phone] on 05/24/2016  Please Advise

## 2016-06-19 NOTE — Telephone Encounter (Signed)
OK to refill but needs uds

## 2016-06-21 MED ORDER — HYDROCODONE-ACETAMINOPHEN 10-325 MG PO TABS: 1.0000 | ORAL_TABLET | Freq: Four times a day (QID) | ORAL | 0 refills | Status: DC | PRN

## 2016-06-21 MED FILL — HYDROCODON-APAP 10-325: 10-325 | 18 days supply | Qty: 70 | Fill #0

## 2016-06-21 NOTE — Addendum Note (Signed)
Addended by: Sharon Seller on: 06/21/2016 07:14 AM   Modules accepted: Orders

## 2016-06-21 NOTE — Telephone Encounter (Signed)
Printed and patient  Notified to pickup/update uds

## 2016-06-22 ENCOUNTER — Encounter: Payer: Self-pay | Admitting: Family Medicine

## 2016-07-06 ENCOUNTER — Institutional Professional Consult (permissible substitution): Payer: Managed Care, Other (non HMO) | Admitting: Pulmonary Disease

## 2016-07-13 ENCOUNTER — Other Ambulatory Visit: Payer: Self-pay | Admitting: Family Medicine

## 2016-07-17 ENCOUNTER — Other Ambulatory Visit: Payer: Self-pay | Admitting: Family Medicine

## 2016-07-17 MED ORDER — HYDROCODONE-ACETAMINOPHEN 10-325 MG PO TABS: 1.0000 | ORAL_TABLET | Freq: Four times a day (QID) | ORAL | 0 refills | Status: DC | PRN

## 2016-07-17 MED ORDER — METHYLPHENIDATE HCL 10 MG PO TABS
10.0000 mg | ORAL_TABLET | Freq: Every day | ORAL | 0 refills | Status: DC
Start: 2016-07-17 — End: 2016-08-27

## 2016-07-17 MED ORDER — METHYLPHENIDATE HCL ER (LA) 30 MG PO CP24: 30.0000 mg | ORAL_CAPSULE | ORAL | 0 refills | Status: DC

## 2016-07-20 MED FILL — METHYLPHENIDATE 10 MG TAB: 10 | 30 days supply | Qty: 30 | Fill #0

## 2016-07-20 MED FILL — HYDROCODON-APAP 10-325: 10-325 | 18 days supply | Qty: 70 | Fill #0

## 2016-07-20 MED FILL — METHYLPHENIDATE LA 30 MG CA: 30 | 30 days supply | Qty: 30 | Fill #0

## 2016-07-27 ENCOUNTER — Ambulatory Visit (INDEPENDENT_AMBULATORY_CARE_PROVIDER_SITE_OTHER): Payer: Managed Care, Other (non HMO) | Admitting: Family Medicine

## 2016-07-27 ENCOUNTER — Encounter: Payer: Self-pay | Admitting: Family Medicine

## 2016-07-27 ENCOUNTER — Ambulatory Visit (HOSPITAL_BASED_OUTPATIENT_CLINIC_OR_DEPARTMENT_OTHER)
Admission: RE | Admit: 2016-07-27 | Discharge: 2016-07-27 | Disposition: A | Discharge status: Home / Self Care | Payer: Managed Care, Other (non HMO) | Source: Ambulatory Visit | Attending: Family Medicine | Admitting: Family Medicine

## 2016-07-27 VITALS — BP 120/82 | HR 49 | Temp 98.0°F | Resp 18 | Wt 184.8 lb

## 2016-07-27 DIAGNOSIS — R1013 Epigastric pain: Secondary | ICD-10-CM | POA: Diagnosis not present

## 2016-07-27 DIAGNOSIS — E039 Hypothyroidism, unspecified: Secondary | ICD-10-CM

## 2016-07-27 DIAGNOSIS — E6609 Other obesity due to excess calories: Secondary | ICD-10-CM

## 2016-07-27 DIAGNOSIS — I10 Essential (primary) hypertension: Secondary | ICD-10-CM

## 2016-07-27 DIAGNOSIS — F3170 Bipolar disorder, currently in remission, most recent episode unspecified: Secondary | ICD-10-CM

## 2016-07-27 DIAGNOSIS — M419 Scoliosis, unspecified: Secondary | ICD-10-CM | POA: Diagnosis not present

## 2016-07-27 DIAGNOSIS — K219 Gastro-esophageal reflux disease without esophagitis: Secondary | ICD-10-CM

## 2016-07-27 DIAGNOSIS — N951 Menopausal and female climacteric states: Secondary | ICD-10-CM

## 2016-07-27 DIAGNOSIS — M47816 Spondylosis without myelopathy or radiculopathy, lumbar region: Secondary | ICD-10-CM | POA: Insufficient documentation

## 2016-07-27 DIAGNOSIS — M549 Dorsalgia, unspecified: Secondary | ICD-10-CM

## 2016-07-27 DIAGNOSIS — K589 Irritable bowel syndrome without diarrhea: Secondary | ICD-10-CM | POA: Diagnosis not present

## 2016-07-27 HISTORY — DX: Irritable bowel syndrome, unspecified: K58.9

## 2016-07-27 MED ORDER — SUCRALFATE 1 G PO TABS: ORAL_TABLET | ORAL | 0 refills | Status: DC

## 2016-07-27 MED ORDER — METOPROLOL TARTRATE 25 MG PO TABS: 12.5000 mg | ORAL_TABLET | Freq: Two times a day (BID) | ORAL | 1 refills | Status: DC

## 2016-07-27 MED ORDER — ZINC GLUCONATE 50 MG PO TABS: 50.0000 mg | ORAL_TABLET | Freq: Every day | ORAL | 2 refills | Status: DC

## 2016-07-27 MED ORDER — RANITIDINE HCL 150 MG PO TABS: 150.0000 mg | ORAL_TABLET | Freq: Two times a day (BID) | ORAL | 1 refills | Status: DC

## 2016-07-27 NOTE — Assessment & Plan Note (Signed)
Has switched from diarrhea to mild constipation. Encouraged increased hydration and fiber in diet. Daily probiotics. If bowels not moving can use MOM 2 tbls po in 4 oz of warm prune juice by mouth every 2-3 days. If no results then repeat in 4 hours with  Dulcolax suppository pr, may repeat again in 4 more hours as needed. Seek care if symptoms worsen. Consider daily Miralax and/or Dulcolax if symptoms persist.

## 2016-07-27 NOTE — Assessment & Plan Note (Signed)
On Levothyroxine, continue to monitor 

## 2016-07-27 NOTE — Assessment & Plan Note (Signed)
Continue current meds doing well.

## 2016-07-27 NOTE — Progress Notes (Signed)
Subjective:  I acted as a Neurosurgeon for Dr. Abner Greenspan. [place], [place]  Patient ID: Joan Robinson, female    DOB: 28-Oct-1960, 56 y.o.   MRN: [id number]  Chief Complaint  Patient presents with  . Follow-up  . Hypertension    HPI  Patient is in today for a follow up she is following up on her hypertension, hypothyroid, sleep apnea and other medical concerns. Patient c/o back pain. She has a long-standing history of low back pain and intermittent thoracic back pain but she endorses they are increasingly worse. No recent fall or trauma. She has been walking 10,000 steps a day and has lost nearly 25 pounds. She notes she feels her back pain is actually worsened somewhat since that has been the case. She has pain around her lower thoracic spine in most positions and change of position does not change it. She is chronic low back pain she describes some mild sense of weakness in her lower legs as well but no falls. No incontinence. She is following with a urologic gynecologist at [facility] for some prolapse issues but with her weight loss and the initiation of Premarin cream she is doing much better. Overall she feels improved. Her fatigue is improved her mood is improved reflux is improved with her weight loss. Denies CP/palp/SOB/HA/congestion/fevers or GU c/o. Taking meds as prescribed. She previously struggled with diarrhea but now is having little bit of constipation she acknowledges she is not necessarily hydrating as well as she should given her exercise.  Patient Care Team: Bradd Canary, MD as PCP - General (Family Medicine) Sherrie George, MD as Consulting Physician (Ophthalmology) Barrett Henle, MD (Ophthalmology) Orpah Cobb, MD as Consulting Physician (Cardiology) Donnetta Hail, MD as Consulting Physician (Rheumatology)   Past Medical History:  Diagnosis Date  . Adenomatous colon polyp   . Allergic rhinitis   . Anemia 10/10/2011  . Anxiety and depression 01/17/2007   Qualifier: Diagnosis of  By: Everardo All MD, Cleophas Dunker   . Arthritis 07/24/2013   Likely inflammatory and following with Dr Maryln Gottron of [facility]  rheumatology  . 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 BCC (basal cell carcinoma of skin) 06/01/2012   Leg Follows with Dr Margo Aye  . Bipolar disorder (HCC) 01/17/2007   Qualifier: Diagnosis of  By: Everardo All MD, Cleophas Dunker   . Chicken pox as a child   X 2  . Colitis, Clostridium difficile 5/11,6/11  . Colon polyp    benign  . Diverticulosis   . Diverticulosis   . 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 Headache(784.0) 08/17/2012  . Hyperlipidemia   . Hypertension   . Hypothyroidism   . Hypothyroidism 08/24/2006   Qualifier: Diagnosis of  By: Everardo All MD, Sean A    . IBS (irritable bowel syndrome) 07/27/2016  . Lower back pain   . 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 Mumps as a child  . Obesity 11/01/2013  . Perimenopausal 01/16/2012  . Preventative health care 10/10/2011  . PUD (peptic ulcer disease)   . Right knee pain 05/10/2012  . Sinusitis acute 10/10/2011  . Sleep apnea 04/27/2016  . Staph aureus infection 12/18/2011   Recurrent lesions in nares  . Tobacco abuse   . Urinary incontinence     Past Surgical History:  Procedure Laterality Date  . ABDOMINAL HYSTERECTOMY    . ABDOMINAL HYSTERECTOMY  2011   complete  . COLONOSCOPY    . DILATION AND CURETTAGE OF UTERUS  1985  . EYE SURGERY  03/03/14   Surgery on right eye for epiretinal membrane (vitreous peel)   . Gated Spect wall motion stress cardiolite  11/05/2001  . POLYPECTOMY    . TUBAL LIGATION  1995  . UTERINE SUSPENSION     mesh  . VITRECTOMY Bilateral 03/03/14    Family History  Problem Relation Age of Onset  . Heart disease Mother        stents  . Stroke Mother   . Hyperlipidemia Mother   . Hypertension Mother   . Other Mother        blood disorder- mgus  . Colon polyps Father   . Other Father        aorta disection  . Aneurysm Father   . Irritable bowel  syndrome Daughter   . Other Daughter        gastritis  . Mental illness Daughter        bipolar and mood disorder  . Hypertension Sister        ?  Marland Kitchen Mental illness Son        bipolar  . Arthritis Sister   . Other Sister        thyroid  . Other Sister        thyroid  . Colon cancer Paternal Uncle     Social History   Social History  . Marital status: Married    Spouse name: N/A  . Number of children: 2  . Years of education: N/A   Occupational History  . 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 Smoking status: Former Smoker    Packs/day: 1.00    Years: 30.00    Types: Cigarettes    Quit date: 05/15/2009  . Smokeless tobacco: Never Used  . Alcohol use No  . Drug use: No  . Sexual activity: No   Other Topics Concern  . Not on file   Social History Narrative  . No narrative on file    Outpatient Medications Prior to Visit  Medication Sig Dispense Refill  . albuterol (PROVENTIL HFA;VENTOLIN HFA) 108 (90 Base) MCG/ACT inhaler Inhale 2 puffs into the lungs every 6 (six) hours as needed for wheezing or shortness of breath. 1 Inhaler 0  . ALPRAZolam (XANAX) 0.25 MG tablet TAKE ONE TABLET BY MOUTH 3 TIMES DAILY AS NEEDED FOR SLEEP OR ANXIETY 30 tablet 0  . Cholecalciferol (VITAMIN D3) 2000 UNITS TABS Take by mouth.    . conjugated estrogens (PREMARIN) vaginal cream Place 1 Applicatorful vaginally every other day. 30 g 5  . HYDROcodone-acetaminophen (NORCO) 10-325 MG tablet Take 1 tablet by mouth every 6 (six) hours as needed. 70 tablet 0  . HYDROcodone-homatropine (HYCODAN) 5-1.5 MG/5ML syrup Take 5 mLs by mouth at bedtime as needed. 50 mL 0  . hydrocortisone (ANUSOL-HC) 25 MG suppository Place 1 suppository (25 mg total) rectally at bedtime. 12 suppository 0  . hydrocortisone-pramoxine (ANALPRAM HC) 2.5-1 % rectal cream Place 1 application rectally 2 (two) times daily. For 10 days 30 g 1  . latanoprost (XALATAN) 0.005 % ophthalmic solution     . levothyroxine  (SYNTHROID, LEVOTHROID) 100 MCG tablet Take 1 tablet (100 mcg total) by mouth daily before breakfast. 30 tablet 6  . methylphenidate (RITALIN LA) 30 MG 24 hr capsule Take 1 capsule (30 mg total) by mouth every morning. July 2018 30 capsule 0  . methylphenidate (RITALIN) 10 MG tablet Take 1 tablet (10 mg total) by mouth daily.  July 2018 30 tablet 0  . nitroGLYCERIN (NITROSTAT) 0.4 MG SL tablet Place 1 tablet (0.4 mg total) under the tongue every 5 (five) minutes as needed for chest pain (max of 3 tabs, to ER if pain persists). 25 tablet 0  . ondansetron (ZOFRAN) 4 MG tablet TAKE ONE TABLET BY MOUTH EVERY 4 HOURS AS NEEDED FOR NAUSEA OR VOMITING 30 tablet 3  . SYNTHROID 100 MCG tablet TAKE ONE TABLET BY MOUTH EVERY DAY BEFORE BREAKFAST 30 tablet 5  . vitamin A 8000 UNIT capsule Take 8,000 Units by mouth daily.    Marland Kitchen azithromycin (ZITHROMAX) 250 MG tablet Take 2 tablets by mouth on day 1, followed by 1 tablet by mouth daily for 4 days. 6 tablet 0  . metoprolol tartrate (LOPRESSOR) 25 MG tablet Take 0.5 tablets (12.5 mg total) by mouth 2 (two) times daily. 90 tablet 1  . ranitidine (ZANTAC) 150 MG tablet TAKE ONE TABLET BY MOUTH TWICE DAILY 180 tablet 1  . sucralfate (CARAFATE) 1 g tablet TAKE ONE TABLET BY MOUTH FOUR (4) TIMES DAILY WITH MEALS AND AT BEDTIME 120 tablet 1  . zinc gluconate 50 MG tablet Take 1 tablet (50 mg total) by mouth daily. 30 tablet 2   No facility-administered medications prior to visit.     Allergies  Allergen Reactions  . Hydrocodone-Acetaminophen Anaphylaxis, Nausea Only and Palpitations  . Keflex [Cephalexin] Diarrhea  . Aspirin     REACTION: difficulty breathing  . Butamben-Tetracaine-Benzocaine     Mouth sores  . Dicyclomine Hcl     REACTION: mouth ulcers  . Metronidazole     REACTION: hives, mouth ulcers  . Moxifloxacin     REACTION: increased heart rate, nausea  . Nsaids     REACTION: difficulty breathing  . Phenergan [Promethazine Hcl]     "knocks" pt out  for about 3 days  . Quetiapine     Somnolence. Slept for 36 hours straight.  . Sulfamethoxazole-Trimethoprim     REACTION: increased heart rate, nausea  . Cymbalta [Duloxetine Hcl] Rash  . Levothyroxine Palpitations  . Moxifloxacin Hcl In Nacl Palpitations  . Orphenadrine Citrate Palpitations    Review of Systems  Constitutional: Negative for fever and malaise/fatigue.  HENT: Negative for congestion.   Eyes: Negative for blurred vision.  Respiratory: Negative for cough and shortness of breath.   Cardiovascular: Negative for chest pain, palpitations and leg swelling.  Gastrointestinal: Positive for heartburn. Negative for vomiting.  Musculoskeletal: Positive for back pain and myalgias.  Skin: Negative for rash.  Neurological: Positive for focal weakness. Negative for loss of consciousness and headaches.       Objective:    Physical Exam  Constitutional: She is oriented to person, place, and time. She appears well-developed and well-nourished. No distress.  HENT:  Head: Normocephalic and atraumatic.  Eyes: Conjunctivae are normal.  Neck: Normal range of motion. No thyromegaly present.  Cardiovascular: Normal rate and regular rhythm.   Pulmonary/Chest: Effort normal and breath sounds normal. She has no wheezes.  Abdominal: Soft. Bowel sounds are normal. There is no tenderness.  Musculoskeletal: Normal range of motion. She exhibits no edema or deformity.  Neurological: She is alert and oriented to person, place, and time.  Skin: Skin is warm and dry. She is not diaphoretic.  Psychiatric: She has a normal mood and affect.    BP 120/82 (BP Location: Left Arm, Patient Position: Sitting, Cuff Size: Normal)   Pulse (!) 49   Temp 98 F (36.7 C) (Oral)  Resp 18   Wt 184 lb 12.8 oz (83.8 kg)   SpO2 95%   BMI 29.83 kg/m  Wt Readings from Last 3 Encounters:  07/27/16 184 lb 12.8 oz (83.8 kg)  05/31/16 195 lb 12.8 oz (88.8 kg)  05/07/16 196 lb 6.4 oz (89.1 kg)   BP Readings  from Last 3 Encounters:  07/27/16 120/82  05/31/16 117/66  05/07/16 125/71     Immunization History  Administered Date(s) Administered  . Influenza Split 10/16/2010, 10/16/2011, 10/25/2015  . Influenza-Unspecified 11/07/2012, 10/21/2013, 10/19/2014  . Pneumococcal Conjugate-13 01/23/2013  . Td 07/04/2004  . Tdap 12/28/2014    Health Maintenance  Topic Date Due  . Hepatitis C Screening  03-May-1960  . HIV Screening  12/16/1975  . PAP SMEAR  01/15/2014  . INFLUENZA VACCINE  08/15/2016  . COLONOSCOPY  03/19/2017  . MAMMOGRAM  04/28/2018  . TETANUS/TDAP  12/27/2024    Lab Results  Component Value Date   WBC 5.8 04/27/2016   HGB 12.3 04/27/2016   HCT 36.9 04/27/2016   PLT 227.0 04/27/2016   GLUCOSE 95 04/27/2016   CHOL 197 04/27/2016   TRIG 170.0 (H) 04/27/2016   HDL 46.90 04/27/2016   LDLDIRECT 104.0 04/08/2015   LDLCALC 116 (H) 04/27/2016   ALT 17 04/27/2016   AST 18 04/27/2016   NA 139 04/27/2016   K 4.2 04/27/2016   CL 104 04/27/2016   CREATININE 0.93 04/27/2016   BUN 18 04/27/2016   CO2 29 04/27/2016   TSH 0.83 04/27/2016   INR 1.0 09/02/2008   HGBA1C 5.4 09/02/2015    Lab Results  Component Value Date   TSH 0.83 04/27/2016   Lab Results  Component Value Date   WBC 5.8 04/27/2016   HGB 12.3 04/27/2016   HCT 36.9 04/27/2016   MCV 86.6 04/27/2016   PLT 227.0 04/27/2016   Lab Results  Component Value Date   NA 139 04/27/2016   K 4.2 04/27/2016   CO2 29 04/27/2016   GLUCOSE 95 04/27/2016   BUN 18 04/27/2016   CREATININE 0.93 04/27/2016   BILITOT 0.4 04/27/2016   ALKPHOS 76 04/27/2016   AST 18 04/27/2016   ALT 17 04/27/2016   PROT 7.2 04/27/2016   ALBUMIN 4.6 04/27/2016   CALCIUM 9.5 04/27/2016   GFR 66.44 04/27/2016   Lab Results  Component Value Date   CHOL 197 04/27/2016   Lab Results  Component Value Date   HDL 46.90 04/27/2016   Lab Results  Component Value Date   LDLCALC 116 (H) 04/27/2016   Lab Results  Component Value Date     TRIG 170.0 (H) 04/27/2016   Lab Results  Component Value Date   CHOLHDL 4 04/27/2016   Lab Results  Component Value Date   HGBA1C 5.4 09/02/2015         Assessment & Plan:   Problem List Items Addressed This Visit    Hypothyroidism    On Levothyroxine, continue to monitor      Relevant Medications   metoprolol tartrate (LOPRESSOR) 25 MG tablet   Bipolar disorder (HCC)    Continue current meds doing well.      Essential hypertension    Well controlled, no changes to meds. Encouraged heart healthy diet such as the DASH diet and exercise as tolerated.       Relevant Medications   metoprolol tartrate (LOPRESSOR) 25 MG tablet   GERD (Chronic)    Doing much better with weight loss. carafate is now just once a  day and is trying to stop      Relevant Medications   sucralfate (CARAFATE) 1 g tablet   ranitidine (ZANTAC) 150 MG tablet   Perimenopausal    Sees Dr Ashley Royalty, Gyn Urology at [facility] with weight loss and Premarin cream twice a week doing mcu better. She returns there in 1 year      Obesity    Encouraged DASH diet, decrease po intake and increase exercise as tolerated. Needs 7-8 hours of sleep nightly. Avoid trans fats, eat small, frequent meals every 4-5 hours with lean proteins, complex carbs and healthy fats. Minimize simple carbs      Dyspepsia   Relevant Medications   sucralfate (CARAFATE) 1 g tablet   IBS (irritable bowel syndrome)    Has switched from diarrhea to mild constipation. Encouraged increased hydration and fiber in diet. Daily probiotics. If bowels not moving can use MOM 2 tbls po in 4 oz of warm prune juice by mouth every 2-3 days. If no results then repeat in 4 hours with  Dulcolax suppository pr, may repeat again in 4 more hours as needed. Seek care if symptoms worsen. Consider daily Miralax and/or Dulcolax if symptoms persist.       Relevant Medications   sucralfate (CARAFATE) 1 g tablet   ranitidine (ZANTAC) 150 MG tablet    Other Visit  Diagnoses    Back pain, unspecified back location, unspecified back pain laterality, unspecified chronicity    -  Primary   Relevant Orders   DG Lumbar Spine Complete   DG Thoracic Spine 2 View   Ambulatory referral to Orthopedic Surgery      I have discontinued Ms. Rampersad's azithromycin. I have also changed her sucralfate and ranitidine. Additionally, I am having her maintain her vitamin A, Vitamin D3, hydrocortisone, hydrocortisone-pramoxine, nitroGLYCERIN, albuterol, conjugated estrogens, HYDROcodone-homatropine, latanoprost, ondansetron, ALPRAZolam, levothyroxine, SYNTHROID, HYDROcodone-acetaminophen, methylphenidate, methylphenidate, zinc gluconate, and metoprolol tartrate.  Meds ordered this encounter  Medications  . zinc gluconate 50 MG tablet    Sig: Take 1 tablet (50 mg total) by mouth daily.    Dispense:  30 tablet    Refill:  2  . sucralfate (CARAFATE) 1 g tablet    Sig: TAKE ONE TABLET BY MOUTH FOUR (4) TIMES DAILY WITH MEALS AND AT BEDTIME prn    Dispense:  120 tablet    Refill:  0  . ranitidine (ZANTAC) 150 MG tablet    Sig: Take 1 tablet (150 mg total) by mouth 2 (two) times daily.    Dispense:  180 tablet    Refill:  1  . metoprolol tartrate (LOPRESSOR) 25 MG tablet    Sig: Take 0.5 tablets (12.5 mg total) by mouth 2 (two) times daily.    Dispense:  90 tablet    Refill:  1  CMA served as Neurosurgeon during this visit. History, Physical and Plan performed by medical provider. Documentation and orders reviewed and attested to.  Danise Edge, MD

## 2016-07-27 NOTE — Assessment & Plan Note (Signed)
Sees Dr Zigmund Daniel, [place] [facility] at [facility] with weight loss and Premarin cream twice a week doing mcu better. She returns there in 1 year

## 2016-07-27 NOTE — Assessment & Plan Note (Signed)
Encouraged DASH diet, decrease po intake and increase exercise as tolerated. Needs 7-8 hours of sleep nightly. Avoid trans fats, eat small, frequent meals every 4-5 hours with lean proteins, complex carbs and healthy fats. Minimize simple carbs 

## 2016-07-27 NOTE — Patient Instructions (Addendum)
Shingrix is the new shingles  Back Pain, Adult Back pain is very common. The pain often gets better over time. The cause of back pain is usually not dangerous. Most people can learn to manage their back pain on their own. Follow these instructions at home: Watch your back pain for any changes. The following actions may help to lessen any pain you are feeling:  Stay active. Start with short walks on flat ground if you can. Try to walk farther each day.  Exercise regularly as told by your doctor. Exercise helps your back heal faster. It also helps avoid future injury by keeping your muscles strong and flexible.  Do not sit, drive, or stand in one place for more than 30 minutes.  Do not stay in bed. Resting more than 1-2 days can slow down your recovery.  Be careful when you bend or lift an object. Use good form when lifting: ? Bend at your knees. ? Keep the object close to your body. ? Do not twist.  Sleep on a firm mattress. Lie on your side, and bend your knees. If you lie on your back, put a pillow under your knees.  Take medicines only as told by your doctor.  Put ice on the injured area. ? Put ice in a plastic bag. ? Place a towel between your skin and the bag. ? Leave the ice on for 20 minutes, 2-3 times a day for the first 2-3 days. After that, you can switch between ice and heat packs.  Avoid feeling anxious or stressed. Find good ways to deal with stress, such as exercise.  Maintain a healthy weight. Extra weight puts stress on your back.  Contact a doctor if:  You have pain that does not go away with rest or medicine.  You have worsening pain that goes down into your legs or buttocks.  You have pain that does not get better in one week.  You have pain at night.  You lose weight.  You have a fever or chills. Get help right away if:  You cannot control when you poop (bowel movement) or pee (urinate).  Your arms or legs feel weak.  Your arms or legs lose  feeling (numbness).  You feel sick to your stomach (nauseous) or throw up (vomit).  You have belly (abdominal) pain.  You feel like you may pass out (faint). This information is not intended to replace advice given to you by your health care provider. Make sure you discuss any questions you have with your health care provider. Document Released: 06/20/2007 Document Revised: 06/09/2015 Document Reviewed: 05/05/2013 Elsevier Interactive Patient Education  [employer].

## 2016-07-27 NOTE — Assessment & Plan Note (Signed)
Well controlled, no changes to meds. Encouraged heart healthy diet such as the DASH diet and exercise as tolerated.  °

## 2016-07-27 NOTE — Assessment & Plan Note (Signed)
Doing much better with weight loss. carafate is now just once a day and is trying to stop

## 2016-08-01 ENCOUNTER — Ambulatory Visit (INDEPENDENT_AMBULATORY_CARE_PROVIDER_SITE_OTHER): Payer: Managed Care, Other (non HMO) | Admitting: Internal Medicine

## 2016-08-01 ENCOUNTER — Encounter: Payer: Self-pay | Admitting: Internal Medicine

## 2016-08-01 VITALS — BP 130/80 | HR 60 | Ht 66.0 in | Wt 185.2 lb

## 2016-08-01 DIAGNOSIS — Z86010 Personal history of colonic polyps: Secondary | ICD-10-CM | POA: Diagnosis not present

## 2016-08-01 DIAGNOSIS — K625 Hemorrhage of anus and rectum: Secondary | ICD-10-CM | POA: Diagnosis not present

## 2016-08-01 MED ORDER — SUPREP BOWEL PREP KIT 17.5-3.13-1.6 GM/177ML PO SOLN: ORAL | 0 refills | Status: DC

## 2016-08-01 NOTE — Patient Instructions (Addendum)
You have been scheduled for a colonoscopy. Please follow written instructions given to you at your visit today.  Please pick up your prep supplies at the pharmacy within the next 1-3 days. If you use inhalers (even only as needed), please bring them with you on the day of your procedure. Your physician has requested that you go to www.startemmi.com and enter the access code given to you at your visit today. This web site gives a general overview about your procedure. However, you should still follow specific instructions given to you by our office regarding your preparation for the procedure.  If you are age 6 or older, your body mass index should be between 23-30. Your Body mass index is 29.89 kg/m. If this is out of the aforementioned range listed, please consider follow up with your Primary Care Provider.  If you are age 52 or younger, your body mass index should be between 19-25. Your Body mass index is 29.89 kg/m. If this is out of the aformentioned range listed, please consider follow up with your Primary Care Provider.

## 2016-08-01 NOTE — Progress Notes (Signed)
HISTORY OF PRESENT ILLNESS:  Joan Robinson is a 56 y.o. female and long-standing patient who has been followed for myriad of GI diagnoses including IBS, peptic ulcer disease, Clostridium difficile diarrhea, adenomatous colon polyps, functional GI complaints, and GERD. She sent today at the urging of her gynecologist regarding rectal bleeding. Patient reports a one-year history of intermittent rectal bleeding which occurs proximal me once per month. Typically she will notice moisture in the perirectal area with Valsalva type maneuvers such as lifting or straining. There will be blood on the undergarments. She will wipe with tissue noticed bright red blood. Generally does not have bleeding with defecation. No rectal pain with defecation. She thought the blood was possibly gynecologic in origin. She was evaluated by her gynecologist who felt this was likely rectal bleeding. Was told that rectal exam at that time (last month) was unrevealing. As mentioned, the patient does have a history of adenomatous colon polyps and has undergone previous colonoscopies in 2003, 2006, and 2014. She has had multiple and large polyps on her index examination. Last examination without neoplasia. Follow-up in 5 years recommended. Her GI review of systems today is otherwise negative. She is pleased report 27 pound weight loss by intention. Overall she feels better. Associated with this has been better control of reflux and less problems with her bowel habits  REVIEW OF SYSTEMS:  All non-GI ROS negative except for allergies, anxiety, depression, arthritis, urinary leakage  Past Medical History:  Diagnosis Date  . Adenomatous colon polyp   . Allergic rhinitis   . Anemia 10/10/2011  . Anxiety and depression 01/17/2007   Qualifier: Diagnosis of  By: Loanne Drilling MD, Jacelyn Pi   . Arthritis 07/24/2013   Likely inflammatory and following with Dr Lynnda Shields of [facility]  rheumatology  . 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 BCC (basal cell carcinoma of  skin) 06/01/2012   Leg Follows with Dr Nevada Crane  . Bipolar disorder ([place]) 01/17/2007   Qualifier: Diagnosis of  By: Loanne Drilling MD, Jacelyn Pi   . Chicken pox as a child   X 2  . Colitis, Clostridium difficile 5/11,6/11  . Colon polyp    benign  . Diverticulosis   . Diverticulosis   . 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 Headache(784.0) 08/17/2012  . Hyperlipidemia   . Hypertension   . Hypothyroidism   . Hypothyroidism 08/24/2006   Qualifier: Diagnosis of  By: Loanne Drilling MD, Sean A    . IBS (irritable bowel syndrome) 07/27/2016  . Lower back pain   . 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 Mumps as a child  . Obesity 11/01/2013  . Perimenopausal 01/16/2012  . Preventative health care 10/10/2011  . PUD (peptic ulcer disease)   . Right knee pain 05/10/2012  . Sinusitis acute 10/10/2011  . Sleep apnea 04/27/2016  . Staph aureus infection 12/18/2011   Recurrent lesions in nares  . Tobacco abuse   . Urinary incontinence     Past Surgical History:  Procedure Laterality Date  . ABDOMINAL HYSTERECTOMY    . ABDOMINAL HYSTERECTOMY  2011   complete  . COLONOSCOPY    . DILATION AND CURETTAGE OF UTERUS  1985  . EYE SURGERY  03/03/14   Surgery on right eye for epiretinal membrane (vitreous peel)   . Gated Spect wall motion stress cardiolite  11/05/2001  . POLYPECTOMY    . TUBAL LIGATION  1995  . UTERINE SUSPENSION     mesh  . VITRECTOMY Bilateral 03/03/14  Social History Joan Robinson  reports that she quit smoking about 7 years ago. Her smoking use included Cigarettes. She has a 30.00 pack-year smoking history. She has never used smokeless tobacco. She reports that she does not drink alcohol or use drugs.  family history includes Aneurysm in her father; Arthritis in her sister; Colon cancer in her paternal uncle; Colon polyps in her father; Heart disease in her mother; Hyperlipidemia in her mother; Hypertension in her mother and sister; Irritable bowel syndrome in  her daughter; Mental illness in her daughter and son; Other in her daughter, father, mother, sister, and sister; Stroke in her mother.  Allergies  Allergen Reactions  . Hydrocodone-Acetaminophen Anaphylaxis, Nausea Only and Palpitations  . Keflex [Cephalexin] Diarrhea  . Aspirin     REACTION: difficulty breathing  . Butamben-Tetracaine-Benzocaine     Mouth sores  . Dicyclomine Hcl     REACTION: mouth ulcers  . Metronidazole     REACTION: hives, mouth ulcers  . Moxifloxacin     REACTION: increased heart rate, nausea  . Nsaids     REACTION: difficulty breathing  . Phenergan [Promethazine Hcl]     "knocks" pt out for about 3 days  . Quetiapine     Somnolence. Slept for 36 hours straight.  . Sulfamethoxazole-Trimethoprim     REACTION: increased heart rate, nausea  . Cymbalta [Duloxetine Hcl] Rash  . Levothyroxine Palpitations  . Moxifloxacin Hcl In Nacl Palpitations  . Orphenadrine Citrate Palpitations       PHYSICAL EXAMINATION: Vital signs: BP 130/80   Pulse 60   Ht '5\' 6"'  (1.676 m)   Wt 185 lb 3.2 oz (84 kg)   BMI 29.89 kg/m   Constitutional: generally well-appearing, no acute distress Psychiatric: alert and oriented x3, cooperative Eyes: extraocular movements intact, anicteric, conjunctiva pink Mouth: oral pharynx moist, no lesions Neck: supple no lymphadenopathy Cardiovascular: heart regular rate and rhythm, no murmur Lungs: clear to auscultation bilaterally Abdomen: soft, nontender, nondistended, no obvious ascites, no peritoneal signs, normal bowel sounds, no organomegaly Rectal:Deferred until colonoscopy Extremities: no clubbing cyanosis or lower extremity edema bilaterally Skin: no lesions on visible extremities Neuro: No focal deficits. Cranial nerves intact  ASSESSMENT:  #1. Rectal bleeding as described. Etiology unclear. #2. History of multiple advanced adenomatous colon polyps. Last examination March 2014. #3. Diverticulosis #4. GERD. No active  symptoms currently #5. History of functional GI complaints and IBS-type symptoms. Currently not active   PLAN:  #1. Schedule colonoscopy to evaluate rectal bleeding and provide colorectal neoplasia surveillance.The nature of the procedure, as well as the risks, benefits, and alternatives were carefully and thoroughly reviewed with the patient. Ample time for discussion and questions allowed. The patient understood, was satisfied, and agreed to proceed.

## 2016-08-15 ENCOUNTER — Other Ambulatory Visit: Payer: Self-pay | Admitting: Family Medicine

## 2016-08-15 NOTE — Telephone Encounter (Signed)
Mychart request from patient for refills on Ritalin 10mg  and 30mg  and Hydrocodone   Last written: All 3 last written 07/27/16 Last ov: 07/27/16 Next ov: 11/26/16 Contract: 02/28/16 UDS: due 12/21/16

## 2016-08-15 NOTE — Telephone Encounter (Signed)
She can have refill when due but not til at least 8/10

## 2016-08-16 NOTE — Telephone Encounter (Signed)
Called the patient informed PCP did approve refills, but she is not due until the 10th . She apologized for the inconvenience.  She looked at her bottles wrong. She will call back on august 9 to request these refills when due.

## 2016-08-24 ENCOUNTER — Other Ambulatory Visit: Payer: Self-pay | Admitting: Family Medicine

## 2016-08-24 MED ORDER — HYDROCODONE-ACETAMINOPHEN 10-325 MG PO TABS: 1.0000 | ORAL_TABLET | Freq: Four times a day (QID) | ORAL | 0 refills | Status: DC | PRN

## 2016-08-24 NOTE — Telephone Encounter (Signed)
Self   Refill for HYDROcodone    CB: [phone]

## 2016-08-24 NOTE — Telephone Encounter (Signed)
Requesting:Hydrocodone Contract: yes UDS: 06/21/16 moderate risk, nx scrn: 12/21/16 Last OV: 07/27/16 Last Refill: 07/17/16  #[id number]  Please Advise

## 2016-08-27 MED ORDER — METHYLPHENIDATE HCL 10 MG PO TABS: 10.0000 mg | ORAL_TABLET | Freq: Every day | ORAL | 0 refills | Status: DC

## 2016-08-27 MED ORDER — METHYLPHENIDATE HCL ER (LA) 30 MG PO CP24: 30.0000 mg | ORAL_CAPSULE | ORAL | 0 refills | Status: DC

## 2016-08-27 MED FILL — HYDROCODON-APAP 10-325: 10-325 | 18 days supply | Qty: 70 | Fill #0

## 2016-08-27 MED FILL — METHYLPHENIDATE LA 30 MG CA: 30 | 30 days supply | Qty: 30 | Fill #0

## 2016-08-27 MED FILL — METHYLPHENIDATE 10 MG TAB: 10 | 30 days supply | Qty: 30 | Fill #0

## 2016-08-27 NOTE — Telephone Encounter (Signed)
Patient would also like refill on her Ritalin 10mg   and Ritalin LA 30mg .  Last filled 07/17/16 for both.  Hydrocodone at front for pickup already

## 2016-08-27 NOTE — Telephone Encounter (Signed)
All requested/approve prescriptions printed/PCP signed and patient notified to pickup at the front desk.

## 2016-08-27 NOTE — Addendum Note (Signed)
Addended by: Sharon Seller B on: 08/27/2016 05:00 PM   Modules accepted: Orders

## 2016-08-27 NOTE — Telephone Encounter (Signed)
Ok to refill both Ritalin Rxs with 1 extra refill

## 2016-09-14 ENCOUNTER — Encounter: Payer: Managed Care, Other (non HMO) | Admitting: Internal Medicine

## 2016-09-20 ENCOUNTER — Institutional Professional Consult (permissible substitution): Payer: Managed Care, Other (non HMO) | Admitting: Internal Medicine

## 2016-09-21 ENCOUNTER — Ambulatory Visit (INDEPENDENT_AMBULATORY_CARE_PROVIDER_SITE_OTHER): Payer: Managed Care, Other (non HMO)

## 2016-09-21 ENCOUNTER — Encounter (INDEPENDENT_AMBULATORY_CARE_PROVIDER_SITE_OTHER): Payer: Self-pay | Admitting: Specialist

## 2016-09-21 ENCOUNTER — Ambulatory Visit (INDEPENDENT_AMBULATORY_CARE_PROVIDER_SITE_OTHER): Payer: Managed Care, Other (non HMO) | Admitting: Specialist

## 2016-09-21 VITALS — BP 126/71 | HR 57 | Ht 66.0 in | Wt 177.0 lb

## 2016-09-21 DIAGNOSIS — M4712 Other spondylosis with myelopathy, cervical region: Secondary | ICD-10-CM

## 2016-09-21 DIAGNOSIS — M542 Cervicalgia: Secondary | ICD-10-CM

## 2016-09-21 DIAGNOSIS — M4724 Other spondylosis with radiculopathy, thoracic region: Secondary | ICD-10-CM

## 2016-09-21 MED ORDER — TRAMADOL-ACETAMINOPHEN 37.5-325 MG PO TABS: 1.0000 | ORAL_TABLET | Freq: Four times a day (QID) | ORAL | 0 refills | Status: DC | PRN

## 2016-09-21 NOTE — Progress Notes (Signed)
Office Visit Note   Patient: Joan Robinson           Date of Birth: 10/01/1960           MRN: [id number] Visit Date: 09/21/2016              Requested by: Bradd Canary, MD [address] [place], [place] [address] PCP: Bradd Canary, MD   Assessment & Plan: Visit Diagnoses:  1. Cervicalgia   2. Thoracic spondylosis with radiculopathy   3. Other spondylosis with myelopathy, cervical region     Plan: Avoid frequent bending and stooping  No lifting greater than 10 lbs. May use ice or moist heat for pain. Weight Robinson is of benefit. ES tylenol 500-650 po TID Tramadol 50-100mg  po 3-4 times per day Calcium citrate  200mg  TID for calcium supplement.   Follow-Up Instructions: No Follow-up on file.   Orders:  Orders Placed This Encounter  Procedures  . XR Cervical Spine 2 or 3 views   No orders of the defined types were placed in this encounter.     Procedures: No procedures performed   Clinical Data: No additional findings.   Subjective: Chief Complaint  Patient presents with  . Lower Back - Pain  . Middle Back - Pain    56 year old female with history of LBP and buttock and hips. Her pain is in the upper lumbar and to the area of the bassiere, mid Up back. No leg pain, numbness or paresthesias. Walking up to 2-3 miles per day, walks for 15 min at a time. No bowel or bladder. Mopping and sweeping with muscle spasm. Twisted wrong? She has small nodules on the back that are present, none on the legs. 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     Review of Systems  Constitutional: Positive for activity change. Negative for appetite change, diaphoresis, fatigue, fever and unexpected weight change.  HENT: Negative for congestion, dental problem, drooling, sinus pain, sinus pressure, sneezing, trouble swallowing and voice change.   Eyes: Negative for visual disturbance.    Respiratory: Negative for chest tightness, shortness of breath and wheezing.   Cardiovascular: Negative for palpitations and leg swelling.  Gastrointestinal: Positive for blood in stool. Negative for abdominal distention, abdominal pain, constipation, diarrhea, nausea, rectal pain and vomiting.  Endocrine: Negative for cold intolerance, heat intolerance and polyphagia.  Genitourinary: Negative for dysuria, enuresis and flank pain.  Musculoskeletal: Positive for back pain. Negative for neck pain and neck stiffness.  Allergic/Immunologic: Negative for environmental allergies and food allergies.  Neurological: Negative for tremors, light-headedness, numbness and headaches.  Hematological: Negative for adenopathy. Bruises/bleeds easily.  Psychiatric/Behavioral: Negative for self-injury and suicidal ideas. The patient is not nervous/anxious.      Objective: Vital Signs: BP 126/71 (BP Location: Left Arm, Patient Position: Sitting)   Pulse (!) 57   Ht 5\' 6"  (1.676 m)   Wt 177 lb (80.3 kg)   BMI 28.57 kg/m   Physical Exam  Constitutional: She is oriented to person, place, and time. She appears well-developed and well-nourished.  HENT:  Head: Normocephalic and atraumatic.  Eyes: Pupils are equal, round, and reactive to light. EOM are normal.  Neck: Normal range of motion. Neck supple.  Pulmonary/Chest: Effort normal and breath sounds normal.  Abdominal: Soft. Bowel sounds are normal.  Musculoskeletal: Normal range of motion.  Neurological: She is alert and oriented to person,  place, and time.  Skin: Skin is warm and dry.  Psychiatric: She has a normal mood and affect. Her behavior is normal. Judgment and thought content normal.    Back Exam   Tenderness  The patient is experiencing tenderness in the thoracic and lumbar.  Range of Motion  Extension: normal  Flexion: normal  Lateral Bend Right: normal  Rotation Right: normal   Muscle Strength  Right Quadriceps:  5/5  Left  Quadriceps:  5/5  Right Hamstrings:  5/5  Left Hamstrings:  5/5   Tests  Straight leg raise right: negative Straight leg raise left: negative  Reflexes  Patellar: normal Achilles: normal Babinski's sign: normal   Other  Toe Walk: normal Heel Walk: normal Sensation: normal Gait: normal       Specialty Comments:  No specialty comments available.  Imaging: No results found.   PMFS History: Patient Active Problem List   Diagnosis Date Noted  . IBS (irritable bowel syndrome) 07/27/2016  . Bladder prolapse, female, acquired 05/07/2016  . Nail dystrophy 04/29/2016  . Fatigue 04/27/2016  . Sleep apnea 04/27/2016  . UTI (urinary tract infection) 09/11/2015  . Rectal lesion 10/10/2014  . Other type of migraine without status migrainosus 05/06/2014  . Dyspepsia 05/06/2014  . Postmenopausal estrogen deficiency 05/06/2014  . Screening for breast cancer 05/06/2014  . 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 Obesity 11/01/2013  . Eustachian tube dysfunction 10/08/2013  . Otalgia of left ear 10/08/2013  . 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 Arthritis 07/24/2013  . Depression with anxiety 03/08/2013  . Paresthesia 12/22/2012  . Headache(784.0) 08/17/2012  . BCC (basal cell carcinoma of skin) 06/01/2012  . Left arm pain 05/10/2012  . Right knee pain 05/10/2012  . 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 ADD (attention deficit disorder) 04/13/2012  . Perimenopausal 01/16/2012  . Anemia 10/10/2011  . Preventative health care 10/10/2011  . Urticaria, chronic 10/10/2011  . 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 Lower back pain   . DIVERTICULITIS-COLON 07/25/2009  . Essential hypertension 06/09/2009  . HELICOBACTER PYLORI GASTRITIS, HX OF 09/06/2008  . GERD 07/27/2008  . PERSONAL HX COLONIC POLYPS 07/27/2008  . Bipolar disorder ([facility]) 01/17/2007  . Hypothyroidism 08/24/2006  . Allergic rhinitis 08/24/2006  . URINARY INCONTINENCE 08/24/2006   Past Medical History:    Diagnosis Date  . Adenomatous colon polyp   . Allergic rhinitis   . Anemia 10/10/2011  . Anxiety and depression 01/17/2007   Qualifier: Diagnosis of  By: Everardo All MD, Cleophas Dunker   . Arthritis 07/24/2013   Likely inflammatory and following with Dr Maryln Gottron of [facility]  rheumatology  . 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 BCC (basal cell carcinoma of skin) 06/01/2012   Leg Follows with Dr Margo Aye  . Bipolar disorder ([facility]) 01/17/2007   Qualifier: Diagnosis of  By: Everardo All MD, Cleophas Dunker   . Chicken pox as a child   X 2  . Colitis, Clostridium difficile 5/11,6/11  . Colon polyp    benign  . Diverticulosis   . Diverticulosis   . 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 Headache(784.0) 08/17/2012  . Hyperlipidemia   . Hypertension   . Hypothyroidism   . Hypothyroidism 08/24/2006   Qualifier: Diagnosis of  By: Everardo All MD, Sean A    . IBS (irritable bowel syndrome) 07/27/2016  . Lower back pain   . 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 Mumps as a child  . Obesity 11/01/2013  . Perimenopausal 01/16/2012  . Preventative health  care 10/10/2011  . PUD (peptic ulcer disease)   . Right knee pain 05/10/2012  . Sinusitis acute 10/10/2011  . Sleep apnea 04/27/2016  . Staph aureus infection 12/18/2011   Recurrent lesions in nares  . Tobacco abuse   . Urinary incontinence     Family History  Problem Relation Age of Onset  . Heart disease Mother        stents  . Stroke Mother   . Hyperlipidemia Mother   . Hypertension Mother   . Other Mother        blood disorder- mgus  . Colon polyps Father   . Other Father        aorta disection  . Aneurysm Father   . Irritable bowel syndrome Daughter   . Other Daughter        gastritis  . Mental illness Daughter        bipolar and mood disorder  . Hypertension Sister        ?  Marland Kitchen Mental illness Son        bipolar  . Arthritis Sister   . Other Sister        thyroid  . Other Sister        thyroid  . Colon cancer Paternal Uncle      Past Surgical History:  Procedure Laterality Date  . ABDOMINAL HYSTERECTOMY    . ABDOMINAL HYSTERECTOMY  2011   complete  . COLONOSCOPY    . DILATION AND CURETTAGE OF UTERUS  1985  . EYE SURGERY  03/03/14   Surgery on right eye for epiretinal membrane (vitreous peel)   . Gated Spect wall motion stress cardiolite  11/05/2001  . POLYPECTOMY    . TUBAL LIGATION  1995  . UTERINE SUSPENSION     mesh  . VITRECTOMY Bilateral 03/03/14   Social History   Occupational History  . 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 Smoking status: Former Smoker    Packs/day: 1.00    Years: 30.00    Types: Cigarettes    Quit date: 05/15/2009  . Smokeless tobacco: Never Used  . Alcohol use No  . Drug use: No  . Sexual activity: No

## 2016-09-21 NOTE — Patient Instructions (Signed)
Plan: Avoid frequent bending and stooping  No lifting greater than 10 lbs. May use ice or moist heat for pain. Weight loss is of benefit. ES tylenol 500-650 po TID Tramadol 50-100mg  po 3-4 times per day Calcium citrate  200mg  TID for calcium supplement.

## 2016-10-01 ENCOUNTER — Other Ambulatory Visit: Payer: Self-pay | Admitting: Family Medicine

## 2016-10-04 ENCOUNTER — Other Ambulatory Visit: Payer: Self-pay

## 2016-10-04 MED ORDER — METHYLPHENIDATE HCL 10 MG PO TABS: 10.0000 mg | ORAL_TABLET | Freq: Every day | ORAL | 0 refills | Status: DC

## 2016-10-04 MED ORDER — METHYLPHENIDATE HCL ER (LA) 30 MG PO CP24: 30.0000 mg | ORAL_CAPSULE | ORAL | 0 refills | Status: DC

## 2016-10-04 MED ORDER — HYDROCODONE-ACETAMINOPHEN 10-325 MG PO TABS: 1.0000 | ORAL_TABLET | Freq: Four times a day (QID) | ORAL | 0 refills | Status: DC | PRN

## 2016-10-04 MED ORDER — NITROGLYCERIN 0.4 MG SL SUBL: 0.4000 mg | SUBLINGUAL_TABLET | SUBLINGUAL | 0 refills | Status: DC | PRN

## 2016-10-04 MED ORDER — HYDROCODONE-ACETAMINOPHEN 10-325 MG PO TABS
1.0000 | ORAL_TABLET | Freq: Four times a day (QID) | ORAL | 0 refills | Status: DC | PRN
Start: 2016-10-04 — End: 2016-10-04

## 2016-10-04 NOTE — Telephone Encounter (Signed)
Requesting: Hydrocodone / ritalin Contract: 02/28/2016 UDS:06/21/2016 Last OV: 07/27/2016 Next OV: 11/26/2016 Last Refill: 08/27/2016 (ritalin) 08/24/2016 (hydrocodone)   Please advise

## 2016-10-04 NOTE — Telephone Encounter (Signed)
Pt is calling regarding about status of prescription, pt is needing her prescription ASAP. Please advise since pt sent message from Monday and has not received respond.

## 2016-10-04 NOTE — Telephone Encounter (Signed)
Patient of rx in the front ofc   PC

## 2016-10-04 NOTE — Addendum Note (Signed)
Addended by: Magdalene Molly A on: 10/04/2016 03:31 PM   Modules accepted: Orders

## 2016-10-05 MED FILL — METHYLPHENIDATE LA 30 MG CA: 30 | 30 days supply | Qty: 30 | Fill #0

## 2016-10-05 MED FILL — HYDROCODON-APAP 10-325: 10-325 | 18 days supply | Qty: 70 | Fill #0

## 2016-10-05 MED FILL — METHYLPHENIDATE 10 MG TAB: 10 | 30 days supply | Qty: 30 | Fill #0

## 2016-10-10 ENCOUNTER — Encounter: Payer: Self-pay | Admitting: Internal Medicine

## 2016-10-19 ENCOUNTER — Encounter: Payer: Self-pay | Admitting: Internal Medicine

## 2016-10-19 ENCOUNTER — Ambulatory Visit (AMBULATORY_SURGERY_CENTER): Payer: Managed Care, Other (non HMO) | Admitting: Internal Medicine

## 2016-10-19 VITALS — BP 120/63 | HR 51 | Temp 99.3°F | Resp 10 | Ht 66.0 in | Wt 185.0 lb

## 2016-10-19 DIAGNOSIS — K921 Melena: Secondary | ICD-10-CM | POA: Diagnosis present

## 2016-10-19 DIAGNOSIS — Z86010 Personal history of colonic polyps: Secondary | ICD-10-CM

## 2016-10-19 DIAGNOSIS — D120 Benign neoplasm of cecum: Secondary | ICD-10-CM

## 2016-10-19 DIAGNOSIS — K6389 Other specified diseases of intestine: Secondary | ICD-10-CM

## 2016-10-19 MED ORDER — SODIUM CHLORIDE 0.9 % IV SOLN: 500.0000 mL | INTRAVENOUS | Status: DC

## 2016-10-19 NOTE — Op Note (Signed)
[facility] Patient Name: Joan Robinson Procedure Date: 10/19/2016 9:18 AM MRN: [id number] Endoscopist: Wilhemina Bonito. Marina Goodell , MD Age: 56 Referring MD:  Date of Birth: October 03, 1960 Gender: Female Account #: [id number] Procedure:                Colonoscopy, with cold snare polypectomy x 1 Indications:              Rectal bleeding. History of multiple and large                            adenomatous colon polyps. Previous Examinations                            2003, 2006, and 2014. Medicines:                Monitored Anesthesia Care Procedure:                Pre-Anesthesia Assessment:                           - Prior to the procedure, a History and Physical                            was performed, and patient medications and                            allergies were reviewed. The patient's tolerance of                            previous anesthesia was also reviewed. The risks                            and benefits of the procedure and the sedation                            options and risks were discussed with the patient.                            All questions were answered, and informed consent                            was obtained. Prior Anticoagulants: The patient has                            taken no previous anticoagulant or antiplatelet                            agents. ASA Grade Assessment: II - A patient with                            mild systemic disease. After reviewing the risks                            and benefits, the patient was deemed in  satisfactory condition to undergo the procedure.                           After obtaining informed consent, the colonoscope                            was passed under direct vision. Throughout the                            procedure, the patient's blood pressure, pulse, and                            oxygen saturations were monitored continuously. The                            Colonoscope  was introduced through the anus and                            advanced to the the cecum, identified by                            appendiceal orifice and ileocecal valve. The                            ileocecal valve, appendiceal orifice, and rectum                            were photographed. The quality of the bowel                            preparation was excellent. The colonoscopy was                            performed without difficulty. The patient tolerated                            the procedure well. The bowel preparation used was                            SUPREP. Scope In: 9:27:56 AM Scope Out: 9:43:49 AM Scope Withdrawal Time: 0 hours 12 minutes 33 seconds  Total Procedure Duration: 0 hours 15 minutes 53 seconds  Findings:                 A 1 mm polyp was found in the cecum. The polyp was                            removed with a cold snare. Resection and retrieval                            were complete.                           Multiple small-mouthed diverticula were found in  the sigmoid colon.                           Internal hemorrhoids were found during                            retroflexion. The hemorrhoids were small. Complications:            No immediate complications. Estimated blood loss:                            None. Estimated Blood Loss:     Estimated blood loss: none. Impression:               - One 1 mm polyp in the cecum, removed with a cold                            snare. Resected and retrieved.                           - Diverticulosis in the sigmoid colon.                           - Internal hemorrhoids. Recommendation:           - Repeat colonoscopy in 5 years for surveillance.                           - Patient has a contact number available for                            emergencies. The signs and symptoms of potential                            delayed complications were discussed with the                             patient. Return to normal activities tomorrow.                            Written discharge instructions were provided to the                            patient.                           - Resume previous diet.                           - Continue present medications.                           - Await pathology results. Wilhemina Bonito. Marina Goodell, MD 10/19/2016 9:50:25 AM This report has been signed electronically.

## 2016-10-19 NOTE — Progress Notes (Signed)
Called to room to assist during endoscopic procedure.  Patient ID and intended procedure confirmed with present staff. Received instructions for my participation in the procedure from the performing physician.  

## 2016-10-19 NOTE — Patient Instructions (Signed)
Impression/Recommendations:  Polyp handout given to patient. Diverticulosis handout given to patient. Hemorrhoid handout given to patient.  Repeat colonoscopy in 5 years for surveillance.  Resume previous diet. Continue present medications.  YOU HAD AN ENDOSCOPIC PROCEDURE TODAY AT THE [facility]:   Refer to the procedure report that was given to you for any specific questions about what was found during the examination.  If the procedure report does not answer your questions, please call your gastroenterologist to clarify.  If you requested that your care partner not be given the details of your procedure findings, then the procedure report has been included in a sealed envelope for you to review at your convenience later.  YOU SHOULD EXPECT: Some feelings of bloating in the abdomen. Passage of more gas than usual.  Walking can help get rid of the air that was put into your GI tract during the procedure and reduce the bloating. If you had a lower endoscopy (such as a colonoscopy or flexible sigmoidoscopy) you may notice spotting of blood in your stool or on the toilet paper. If you underwent a bowel prep for your procedure, you may not have a normal bowel movement for a few days.  Please Note:  You might notice some irritation and congestion in your nose or some drainage.  This is from the oxygen used during your procedure.  There is no need for concern and it should clear up in a day or so.  SYMPTOMS TO REPORT IMMEDIATELY:   Following lower endoscopy (colonoscopy or flexible sigmoidoscopy):  Excessive amounts of blood in the stool  Significant tenderness or worsening of abdominal pains  Swelling of the abdomen that is new, acute  Fever of 100F or higher  For urgent or emergent issues, a gastroenterologist can be reached at any hour by calling [phone].   DIET:  We do recommend a small meal at first, but then you may proceed to your regular diet.  Drink plenty of  fluids but you should avoid alcoholic beverages for 24 hours.  ACTIVITY:  You should plan to take it easy for the rest of today and you should NOT DRIVE or use heavy machinery until tomorrow (because of the sedation medicines used during the test).    FOLLOW UP: Our staff will call the number listed on your records the next business day following your procedure to check on you and address any questions or concerns that you may have regarding the information given to you following your procedure. If we do not reach you, we will leave a message.  However, if you are feeling well and you are not experiencing any problems, there is no need to return our call.  We will assume that you have returned to your regular daily activities without incident.  If any biopsies were taken you will be contacted by phone or by letter within the next 1-3 weeks.  Please call us at [phone] if you have not heard about the biopsies in 3 weeks.    SIGNATURES/CONFIDENTIALITY: You and/or your care partner have signed paperwork which will be entered into your electronic medical record.  These signatures attest to the fact that that the information above on your After Visit Summary has been reviewed and is understood.  Full responsibility of the confidentiality of this discharge information lies with you and/or your care-partner. 

## 2016-10-19 NOTE — Progress Notes (Signed)
To recovery, report to RN, VSS. 

## 2016-10-22 ENCOUNTER — Telehealth: Payer: Self-pay | Admitting: *Deleted

## 2016-10-22 NOTE — Telephone Encounter (Signed)
  Follow up Call-  Call back number 10/19/2016  Post procedure Call Back phone  # [phone]  Permission to leave phone message Yes  Some recent data might be hidden     Patient questions:  Do you have a fever, pain , or abdominal swelling? No. Pain Score  0 *  Have you tolerated food without any problems? Yes.    Have you been able to return to your normal activities? Yes.    Do you have any questions about your discharge instructions: Diet   No. Medications  No. Follow up visit  No.  Do you have questions or concerns about your Care? No.  Actions: * If pain score is 4 or above: No action needed, pain <4.

## 2016-10-23 ENCOUNTER — Encounter: Payer: Self-pay | Admitting: Internal Medicine

## 2016-10-31 ENCOUNTER — Telehealth: Payer: Self-pay | Admitting: *Deleted

## 2016-10-31 NOTE — Telephone Encounter (Signed)
Received Lab Report results from Dr. Amil Amen; forwarded to provider/SLS 10/17

## 2016-11-06 ENCOUNTER — Other Ambulatory Visit: Payer: Self-pay | Admitting: Family Medicine

## 2016-11-08 MED ORDER — HYDROCODONE-ACETAMINOPHEN 10-325 MG PO TABS: 1.0000 | ORAL_TABLET | Freq: Four times a day (QID) | ORAL | 0 refills | Status: DC | PRN

## 2016-11-08 MED ORDER — METHYLPHENIDATE HCL ER (LA) 30 MG PO CP24: 30.0000 mg | ORAL_CAPSULE | ORAL | 0 refills | Status: DC

## 2016-11-08 NOTE — Telephone Encounter (Signed)
°  Relation to pt: self  Call back number: [phone]   Reason for call:  Patient following up on medication refill, HYDROcodone-acetaminophen (NORCO) 10-325 MG tablet and methylphenidate (RITALIN) 10 MG tablet, patient sent Mychart message 11/07/16 and last office visit 07/27/16 as per AVS return in 3 months. Patient scheduled for 11/23/2016

## 2016-11-08 NOTE — Telephone Encounter (Signed)
Requesting:Ritalin &   Norco  Contract:yes UGA:[id number] risk 12/21/16 Last OV:07/27/16 Next OV:11/23/16 Last Refill: 10/04/16  Norco  #[id number]  10/04/16 Ritalin #[id number]  Please advise

## 2016-11-09 NOTE — Telephone Encounter (Signed)
Called and left message rx was ready for pick up

## 2016-11-15 MED FILL — METHYLPHENIDATE ER 30 MG CA: 30 | 30 days supply | Qty: 30 | Fill #0

## 2016-11-15 MED FILL — HYDROCODON-APAP 10-325: 10-325 | 17 days supply | Qty: 70 | Fill #0

## 2016-11-23 ENCOUNTER — Ambulatory Visit: Payer: Managed Care, Other (non HMO) | Admitting: Family Medicine

## 2016-11-26 ENCOUNTER — Ambulatory Visit: Payer: Managed Care, Other (non HMO) | Admitting: Family Medicine

## 2016-11-30 ENCOUNTER — Ambulatory Visit (INDEPENDENT_AMBULATORY_CARE_PROVIDER_SITE_OTHER): Payer: Managed Care, Other (non HMO) | Admitting: Specialist

## 2016-11-30 ENCOUNTER — Encounter (INDEPENDENT_AMBULATORY_CARE_PROVIDER_SITE_OTHER): Payer: Self-pay

## 2016-12-17 ENCOUNTER — Other Ambulatory Visit: Payer: Self-pay | Admitting: Family Medicine

## 2016-12-17 DIAGNOSIS — K629 Disease of anus and rectum, unspecified: Secondary | ICD-10-CM

## 2016-12-17 DIAGNOSIS — F32A Depression, unspecified: Secondary | ICD-10-CM

## 2016-12-17 DIAGNOSIS — R1013 Epigastric pain: Secondary | ICD-10-CM

## 2016-12-17 DIAGNOSIS — F329 Major depressive disorder, single episode, unspecified: Secondary | ICD-10-CM

## 2016-12-17 MED ORDER — METHYLPHENIDATE HCL ER (LA) 30 MG PO CP24: 30.0000 mg | ORAL_CAPSULE | ORAL | 0 refills | Status: DC

## 2016-12-17 MED ORDER — HYDROCODONE-ACETAMINOPHEN 10-325 MG PO TABS: 1.0000 | ORAL_TABLET | Freq: Four times a day (QID) | ORAL | 0 refills | Status: DC | PRN

## 2016-12-17 MED ORDER — ALPRAZOLAM 0.25 MG PO TABS: ORAL_TABLET | ORAL | 0 refills | Status: DC

## 2016-12-17 MED ORDER — SUCRALFATE 1 G PO TABS: ORAL_TABLET | ORAL | 0 refills | Status: DC

## 2016-12-17 MED ORDER — HYDROCORTISONE ACETATE 25 MG RE SUPP: 25.0000 mg | Freq: Every day | RECTAL | 0 refills | Status: DC

## 2016-12-17 MED ORDER — METHYLPHENIDATE HCL 10 MG PO TABS: 10.0000 mg | ORAL_TABLET | Freq: Every day | ORAL | 0 refills | Status: DC

## 2016-12-17 NOTE — Addendum Note (Signed)
Addended by: Bartholome Bill on: 12/17/2016 02:46 PM   Modules accepted: Orders

## 2016-12-17 NOTE — Telephone Encounter (Signed)
Requesting: Melanee Left  NORCO Contract: YES UDS: 06/22/2016 Last OV: 07/27/2016 Next OV: 12/21/2016 Last Refill: XANAX-05/23/2016 #30 NO RF RITALIN-11/06/2016 #30 NO RF NORCO-09/21/2016 #70 NO RF  Please advise

## 2016-12-18 NOTE — Telephone Encounter (Signed)
Pt was called left voicemail.

## 2016-12-21 ENCOUNTER — Ambulatory Visit: Payer: Managed Care, Other (non HMO) | Admitting: Family Medicine

## 2016-12-21 ENCOUNTER — Encounter: Payer: Self-pay | Admitting: Family Medicine

## 2016-12-21 VITALS — BP 114/68 | HR 62 | Temp 98.7°F | Resp 18 | Wt 168.2 lb

## 2016-12-21 DIAGNOSIS — L603 Nail dystrophy: Secondary | ICD-10-CM | POA: Diagnosis not present

## 2016-12-21 DIAGNOSIS — E039 Hypothyroidism, unspecified: Secondary | ICD-10-CM | POA: Diagnosis not present

## 2016-12-21 DIAGNOSIS — E6609 Other obesity due to excess calories: Secondary | ICD-10-CM

## 2016-12-21 DIAGNOSIS — Z7289 Other problems related to lifestyle: Secondary | ICD-10-CM

## 2016-12-21 DIAGNOSIS — I10 Essential (primary) hypertension: Secondary | ICD-10-CM

## 2016-12-21 LAB — LIPID PANEL
Cholesterol: 155 mg/dL (ref 0–200)
HDL: 56.2 mg/dL (ref >39.00)
LDL Cholesterol: 76 mg/dL (ref 0–99)
NONHDL: 98.95
Total CHOL/HDL Ratio: 3
Triglycerides: 117 mg/dL (ref 0.0–149.0)
VLDL: 23.4 mg/dL (ref 0.0–40.0)

## 2016-12-21 LAB — TSH: TSH: 0.32 u[IU]/mL — ABNORMAL LOW (ref 0.35–4.50)

## 2016-12-21 LAB — COMPREHENSIVE METABOLIC PANEL
ALK PHOS: 65 U/L (ref 39–117)
ALT: 13 U/L (ref 0–35)
AST: 16 U/L (ref 0–37)
Albumin: 4.3 g/dL (ref 3.5–5.2)
BILIRUBIN TOTAL: 0.4 mg/dL (ref 0.2–1.2)
BUN: 16 mg/dL (ref 6–23)
CALCIUM: 8.9 mg/dL (ref 8.4–10.5)
CO2: 29 mEq/L (ref 19–32)
Chloride: 105 mEq/L (ref 96–112)
Creatinine, Ser: 0.86 mg/dL (ref 0.40–1.20)
GFR: 72.54 mL/min (ref >60.00)
GLUCOSE: 96 mg/dL (ref 70–99)
Potassium: 4 mEq/L (ref 3.5–5.1)
Sodium: 139 mEq/L (ref 135–145)
TOTAL PROTEIN: 6.3 g/dL (ref 6.0–8.3)

## 2016-12-21 LAB — CBC
HCT: 36.1 % (ref 36.0–46.0)
HEMOGLOBIN: 11.8 g/dL — AB (ref 12.0–15.0)
MCHC: 32.7 g/dL (ref 30.0–36.0)
MCV: 89.4 fl (ref 78.0–100.0)
PLATELETS: 189 10*3/uL (ref 150.0–400.0)
RBC: 4.04 Mil/uL (ref 3.87–5.11)
RDW: 13.5 % (ref 11.5–15.5)
WBC: 4.3 10*3/uL (ref 4.0–10.5)

## 2016-12-21 MED ORDER — METOPROLOL TARTRATE 25 MG PO TABS: 12.5000 mg | ORAL_TABLET | Freq: Every day | ORAL | 1 refills | Status: DC

## 2016-12-21 MED FILL — HYDROCODON-APAP 10-325: 10-325 | 17 days supply | Qty: 70 | Fill #0

## 2016-12-21 MED FILL — METHYLPHENIDATE 10 MG TAB: 10 | 30 days supply | Qty: 30 | Fill #0

## 2016-12-21 MED FILL — METHYLPHENIDATE ER 30 MG CA: 30 | 30 days supply | Qty: 30 | Fill #0

## 2016-12-21 MED FILL — ALPRAZolam 0.25 MG TABS: 0.25 | 10 days supply | Qty: 30 | Fill #0

## 2016-12-21 NOTE — Assessment & Plan Note (Signed)
On Levothyroxine, continue to monitor 

## 2016-12-21 NOTE — Progress Notes (Signed)
Subjective:  I acted as a Neurosurgeon for Dr. Abner Greenspan. Joan Robinson, Joan Robinson  Patient ID: Joan Robinson, female    DOB: 24-Apr-1960, 56 y.o.   MRN: [id number]  No chief complaint on file.   HPI  Patient is in today for a 3 month and she feels well.  She has been working hard on decreasing her p.o. intake minimizing her carbohydrates and exercising and is very pleased with her weight loss.  As a result of her weight loss and her increased exercise she feels much better.  She is drinking water instead of sodas.  Her myalgias her reflux her fatigue and her depression are all improved.  No recent febrile illness or hospitalization.  Her greatest complaint is of worsening right great toe pain.  She thinks it has worsened since she started walking she does have thickening of the toenail as well as pain and redness around the nail bed and is interested in having this definitively managed at this time. Denies CP/palp/SOB/HA/congestion/fevers/GI or GU c/o. Taking meds as prescribed  Patient Care Team: Bradd Canary, MD as PCP - General (Family Medicine) Sherrie George, MD as Consulting Physician (Ophthalmology) Barrett Henle, MD (Ophthalmology) Orpah Cobb, MD as Consulting Physician (Cardiology) Donnetta Hail, MD as Consulting Physician (Rheumatology)   Past Medical History:  Diagnosis Date  . Adenomatous colon polyp   . Allergic rhinitis   . Anemia 10/10/2011  . Anxiety and depression 01/17/2007   Qualifier: Diagnosis of  By: Everardo All MD, Cleophas Dunker   . Arthritis 07/24/2013   Likely inflammatory and following with Dr Maryln Gottron of [facility]  rheumatology  . 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 BCC (basal cell carcinoma of skin) 06/01/2012   Leg Follows with Dr Margo Aye  . Bipolar disorder (HCC) 01/17/2007   Qualifier: Diagnosis of  By: Everardo All MD, Cleophas Dunker   . Chicken pox as a child   X 2  . Colitis, Clostridium difficile 5/11,6/11  . Colon polyp    benign  . Diverticulosis   . Diverticulosis   . 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 Headache(784.0) 08/17/2012  . Hyperlipidemia   . Hypertension   . Hypothyroidism   . Hypothyroidism 08/24/2006   Qualifier: Diagnosis of  By: Everardo All MD, Sean A    . IBS (irritable bowel syndrome) 07/27/2016  . Lower back pain   . 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 Mumps as a child  . Obesity 11/01/2013  . Perimenopausal 01/16/2012  . Preventative health care 10/10/2011  . PUD (peptic ulcer disease)   . Right knee pain 05/10/2012  . Sinusitis acute 10/10/2011  . Sleep apnea 04/27/2016  . Staph aureus infection 12/18/2011   Recurrent lesions in nares  . Tobacco abuse   . Urinary incontinence     Past Surgical History:  Procedure Laterality Date  . ABDOMINAL HYSTERECTOMY    . ABDOMINAL HYSTERECTOMY  2011   complete  . COLONOSCOPY    . DILATION AND CURETTAGE OF UTERUS  1985  . EYE SURGERY  03/03/14   Surgery on right eye for epiretinal membrane (vitreous peel)   . Gated Spect wall motion stress cardiolite  11/05/2001  . POLYPECTOMY    . TUBAL LIGATION  1995  . UTERINE SUSPENSION     mesh  . VITRECTOMY Bilateral 03/03/14    Family History  Problem Relation Age of Onset  . Heart disease Mother        stents  . Stroke  Mother   . Hyperlipidemia Mother   . Hypertension Mother   . Other Mother        blood disorder- mgus  . Colon polyps Father   . Other Father        aorta disection  . Aneurysm Father   . Irritable bowel syndrome Daughter   . Other Daughter        gastritis  . Mental illness Daughter        bipolar and mood disorder  . Hypertension Sister        ?  Marland Kitchen Mental illness Son        bipolar  . Arthritis Sister   . Other Sister        thyroid  . Other Sister        thyroid  . Colon cancer Paternal Uncle     Social History   Socioeconomic History  . Marital status: Married    Spouse name: Not on file  . Number of children: 2  . Years of education: Not on file  . Highest education level: Not on  file  Social Needs  . Financial resource strain: Not on file  . Food insecurity - worry: Not on file  . Food insecurity - inability: Not on file  . Transportation needs - medical: Not on file  . Transportation needs - non-medical: Not on file  Occupational History  . Occupation: Aeronautical engineer  Tobacco Use  . Smoking status: Former Smoker    Packs/day: 1.00    Years: 30.00    Pack years: 30.00    Types: Cigarettes    Last attempt to quit: 05/15/2009    Years since quitting: 7.6  . Smokeless tobacco: Never Used  Substance and Sexual Activity  . Alcohol use: No  . Drug use: No  . Sexual activity: No  Other Topics Concern  . Not on file  Social History Narrative  . Not on file    Outpatient Medications Prior to Visit  Medication Sig Dispense Refill  . albuterol (PROVENTIL HFA;VENTOLIN HFA) 108 (90 Base) MCG/ACT inhaler Inhale 2 puffs into the lungs every 6 (six) hours as needed for wheezing or shortness of breath. 1 Inhaler 0  . ALPRAZolam (XANAX) 0.25 MG tablet TAKE ONE TABLET BY MOUTH 3 TIMES DAILY AS NEEDED FOR SLEEP OR ANXIETY 30 tablet 0  . Cholecalciferol (VITAMIN D3) 2000 UNITS TABS Take by mouth.    . conjugated estrogens (PREMARIN) vaginal cream Place 1 Applicatorful vaginally every other day. 30 g 5  . HYDROcodone-acetaminophen (NORCO) 10-325 MG tablet Take 1 tablet by mouth every 6 (six) hours as needed. 70 tablet 0  . hydrocortisone (ANUSOL-HC) 25 MG suppository Place 1 suppository (25 mg total) rectally at bedtime. 12 suppository 0  . hydrocortisone-pramoxine (ANALPRAM HC) 2.5-1 % rectal cream Place 1 application rectally 2 (two) times daily. For 10 days 30 g 1  . latanoprost (XALATAN) 0.005 % ophthalmic solution     . levothyroxine (SYNTHROID, LEVOTHROID) 100 MCG tablet Take 1 tablet (100 mcg total) by mouth daily before breakfast. 30 tablet 6  . methylphenidate (RITALIN LA) 30 MG 24 hr capsule Take 1 capsule (30 mg total) by mouth every morning. December  2018 30  capsule 0  . methylphenidate (RITALIN) 10 MG tablet Take 1 tablet (10 mg total) by mouth daily. September 2018 30 tablet 0  . nitroGLYCERIN (NITROSTAT) 0.4 MG SL tablet Place 1 tablet (0.4 mg total) under the tongue every 5 (five) minutes as  needed for chest pain (max of 3 tabs, to ER if pain persists). 25 tablet 0  . ondansetron (ZOFRAN) 4 MG tablet TAKE ONE TABLET BY MOUTH EVERY 4 HOURS AS NEEDED FOR NAUSEA OR VOMITING 30 tablet 3  . ranitidine (ZANTAC) 150 MG tablet Take 1 tablet (150 mg total) by mouth 2 (two) times daily. 180 tablet 1  . sucralfate (CARAFATE) 1 g tablet TAKE ONE TABLET BY MOUTH FOUR (4) TIMES DAILY WITH MEALS AND AT BEDTIME prn 120 tablet 0  . vitamin A 8000 UNIT capsule Take 8,000 Units by mouth daily.    Marland Kitchen zinc gluconate 50 MG tablet Take 1 tablet (50 mg total) by mouth daily. 30 tablet 2  . metoprolol tartrate (LOPRESSOR) 25 MG tablet Take 0.5 tablets (12.5 mg total) by mouth 2 (two) times daily. 90 tablet 1  . traMADol-acetaminophen (ULTRACET) 37.5-325 MG tablet Take 1 tablet by mouth every 6 (six) hours as needed. 30 tablet 0   No facility-administered medications prior to visit.     Allergies  Allergen Reactions  . Keflex [Cephalexin] Diarrhea  . Aspirin     REACTION: difficulty breathing  . Butamben-Tetracaine-Benzocaine     Mouth sores  . Dicyclomine Hcl     REACTION: mouth ulcers  . Metronidazole     REACTION: hives, mouth ulcers  . Moxifloxacin     REACTION: increased heart rate, nausea  . Nsaids     REACTION: difficulty breathing  . Phenergan [Promethazine Hcl]     "knocks" pt out for about 3 days  . Quetiapine     Somnolence. Slept for 36 hours straight.  . Sulfamethoxazole-Trimethoprim     REACTION: increased heart rate, nausea  . Cymbalta [Duloxetine Hcl] Rash  . Levothyroxine Palpitations  . Moxifloxacin Hcl In Nacl Palpitations  . Orphenadrine Citrate Palpitations    Review of Systems  Constitutional: Negative for fever and  malaise/fatigue.  HENT: Negative for congestion.   Eyes: Negative for blurred vision.  Respiratory: Negative for cough and shortness of breath.   Cardiovascular: Negative for chest pain, palpitations and leg swelling.  Gastrointestinal: Negative for vomiting.  Musculoskeletal: Positive for joint pain. Negative for back pain.  Skin: Negative for rash.  Neurological: Negative for loss of consciousness and headaches.       Objective:    Physical Exam  Constitutional: She is oriented to person, place, and time. She appears well-developed and well-nourished. No distress.  HENT:  Head: Normocephalic and atraumatic.  Eyes: Conjunctivae are normal.  Neck: Normal range of motion. No thyromegaly present.  Cardiovascular: Normal rate and regular rhythm.  Pulmonary/Chest: Effort normal and breath sounds normal. She has no wheezes.  Abdominal: Soft. Bowel sounds are normal. There is no tenderness.  Musculoskeletal: Normal range of motion. She exhibits no edema or deformity.  Neurological: She is alert and oriented to person, place, and time.  Skin: Skin is warm and dry. She is not diaphoretic.  Skin around base lf right great toenail thick, redd, tender. Toenail thickened  Psychiatric: She has a normal mood and affect.    BP 114/68 (BP Location: Left Arm, Patient Position: Sitting, Cuff Size: Normal)   Pulse 62   Temp 98.7 F (37.1 C) (Oral)   Resp 18   Wt 168 lb 3.2 oz (76.3 kg)   SpO2 98%   BMI 27.15 kg/m  Wt Readings from Last 3 Encounters:  12/21/16 168 lb 3.2 oz (76.3 kg)  10/19/16 185 lb (83.9 kg)  09/21/16 177 lb (80.3 kg)  BP Readings from Last 3 Encounters:  12/21/16 114/68  10/19/16 120/63  09/21/16 126/71     Immunization History  Administered Date(s) Administered  . Influenza Split 10/16/2010, 10/16/2011, 10/25/2015  . Influenza-Unspecified 11/07/2012, 10/21/2013, 10/19/2014  . Pneumococcal Conjugate-13 01/23/2013  . Td 07/04/2004  . Tdap 12/28/2014      Health Maintenance  Topic Date Due  . HIV Screening  12/16/1975  . PAP SMEAR  01/15/2014  . INFLUENZA VACCINE  08/15/2016  . MAMMOGRAM  04/28/2018  . COLONOSCOPY  10/19/2021  . TETANUS/TDAP  12/27/2024  . Hepatitis C Screening  Completed    Lab Results  Component Value Date   WBC 4.3 12/21/2016   HGB 11.8 (L) 12/21/2016   HCT 36.1 12/21/2016   PLT 189.0 12/21/2016   GLUCOSE 96 12/21/2016   CHOL 155 12/21/2016   TRIG 117.0 12/21/2016   HDL 56.20 12/21/2016   LDLDIRECT 104.0 04/08/2015   LDLCALC 76 12/21/2016   ALT 13 12/21/2016   AST 16 12/21/2016   NA 139 12/21/2016   K 4.0 12/21/2016   CL 105 12/21/2016   CREATININE 0.86 12/21/2016   BUN 16 12/21/2016   CO2 29 12/21/2016   TSH 0.32 (L) 12/21/2016   INR 1.0 09/02/2008   HGBA1C 5.4 09/02/2015    Lab Results  Component Value Date   TSH 0.32 (L) 12/21/2016   Lab Results  Component Value Date   WBC 4.3 12/21/2016   HGB 11.8 (L) 12/21/2016   HCT 36.1 12/21/2016   MCV 89.4 12/21/2016   PLT 189.0 12/21/2016   Lab Results  Component Value Date   NA 139 12/21/2016   K 4.0 12/21/2016   CO2 29 12/21/2016   GLUCOSE 96 12/21/2016   BUN 16 12/21/2016   CREATININE 0.86 12/21/2016   BILITOT 0.4 12/21/2016   ALKPHOS 65 12/21/2016   AST 16 12/21/2016   ALT 13 12/21/2016   PROT 6.3 12/21/2016   ALBUMIN 4.3 12/21/2016   CALCIUM 8.9 12/21/2016   GFR 72.54 12/21/2016   Lab Results  Component Value Date   CHOL 155 12/21/2016   Lab Results  Component Value Date   HDL 56.20 12/21/2016   Lab Results  Component Value Date   LDLCALC 76 12/21/2016   Lab Results  Component Value Date   TRIG 117.0 12/21/2016   Lab Results  Component Value Date   CHOLHDL 3 12/21/2016   Lab Results  Component Value Date   HGBA1C 5.4 09/02/2015         Assessment & Plan:   Problem List Items Addressed This Visit    Hypothyroidism    On Levothyroxine, continue to monitor      Relevant Medications   metoprolol  tartrate (LOPRESSOR) 25 MG tablet   Other Relevant Orders   Lipid panel (Completed)   Essential hypertension    Well controlled, no changes to meds. Encouraged heart healthy diet such as the DASH diet and exercise as tolerated.       Relevant Medications   metoprolol tartrate (LOPRESSOR) 25 MG tablet   Other Relevant Orders   CBC (Completed)   Comprehensive metabolic panel (Completed)   Lipid panel (Completed)   TSH (Completed)   Obesity    Encouraged DASH diet, decrease po intake and increase exercise as tolerated. Needs 7-8 hours of sleep nightly. Avoid trans fats. Has had good weight loss since last visit.+       Dystrophic nail    Right great toenail hurts with walking and pressure. Is  referred to podiatry for further consideration. Soak in distilled white vinegar and water nightly and apply topical lamisil daily       Other Visit Diagnoses    Other problems related to lifestyle    -  Primary   Relevant Orders   Hepatitis C Antibody (Completed)      I have discontinued Joan Robinson. Joan Robinson traMADol-acetaminophen. I have also changed her metoprolol tartrate. Additionally, I am having her maintain her vitamin A, Vitamin D3, hydrocortisone-pramoxine, albuterol, conjugated estrogens, latanoprost, ondansetron, levothyroxine, zinc gluconate, ranitidine, nitroGLYCERIN, hydrocortisone, sucralfate, methylphenidate, methylphenidate, ALPRAZolam, and HYDROcodone-acetaminophen.  Meds ordered this encounter  Medications  . metoprolol tartrate (LOPRESSOR) 25 MG tablet    Sig: Take 0.5 tablets (12.5 mg total) by mouth daily.    Dispense:  45 tablet    Refill:  1    CMA served as scribe during this visit. History, Physical and Plan performed by medical provider. Documentation and orders reviewed and attested to.  Danise Edge, MD

## 2016-12-21 NOTE — Patient Instructions (Addendum)
Shingrix shot 2 shots over  Hypertension Hypertension is another name for high blood pressure. High blood pressure forces your heart to work harder to pump blood. This can cause problems over time. There are two numbers in a blood pressure reading. There is a top number (systolic) over a bottom number (diastolic). It is best to have a blood pressure below 120/80. Healthy choices can help lower your blood pressure. You may need medicine to help lower your blood pressure if:  Your blood pressure cannot be lowered with healthy choices.  Your blood pressure is higher than 130/80.  Follow these instructions at home: Eating and drinking  If directed, follow the DASH eating plan. This diet includes: ? Filling half of your plate at each meal with fruits and vegetables. ? Filling one quarter of your plate at each meal with whole grains. Whole grains include whole wheat pasta, brown rice, and whole grain bread. ? Eating or drinking low-fat dairy products, such as skim milk or low-fat yogurt. ? Filling one quarter of your plate at each meal with low-fat (lean) proteins. Low-fat proteins include fish, skinless chicken, eggs, beans, and tofu. ? Avoiding fatty meat, cured and processed meat, or chicken with skin. ? Avoiding premade or processed food.  Eat less than 1,500 mg of salt (sodium) a day.  Limit alcohol use to no more than 1 drink a day for nonpregnant women and 2 drinks a day for men. One drink equals 12 oz of beer, 5 oz of wine, or 1 oz of hard liquor. Lifestyle  Work with your doctor to stay at a healthy weight or to lose weight. Ask your doctor what the best weight is for you.  Get at least 30 minutes of exercise that causes your heart to beat faster (aerobic exercise) most days of the week. This may include walking, swimming, or biking.  Get at least 30 minutes of exercise that strengthens your muscles (resistance exercise) at least 3 days a week. This may include lifting weights or  pilates.  Do not use any products that contain nicotine or tobacco. This includes cigarettes and e-cigarettes. If you need help quitting, ask your doctor.  Check your blood pressure at home as told by your doctor.  Keep all follow-up visits as told by your doctor. This is important. Medicines  Take over-the-counter and prescription medicines only as told by your doctor. Follow directions carefully.  Do not skip doses of blood pressure medicine. The medicine does not work as well if you skip doses. Skipping doses also puts you at risk for problems.  Ask your doctor about side effects or reactions to medicines that you should watch for. Contact a doctor if:  You think you are having a reaction to the medicine you are taking.  You have headaches that keep coming back (recurring).  You feel dizzy.  You have swelling in your ankles.  You have trouble with your vision. Get help right away if:  You get a very bad headache.  You start to feel confused.  You feel weak or numb.  You feel faint.  You get very bad pain in your: ? Chest. ? Belly (abdomen).  You throw up (vomit) more than once.  You have trouble breathing. Summary  Hypertension is another name for high blood pressure.  Making healthy choices can help lower blood pressure. If your blood pressure cannot be controlled with healthy choices, you may need to take medicine. This information is not intended to replace advice given  to you by your health care provider. Make sure you discuss any questions you have with your health care provider. Document Released: 06/20/2007 Document Revised: 11/30/2015 Document Reviewed: 11/30/2015 Elsevier Interactive Patient Education  [employer].

## 2016-12-21 NOTE — Assessment & Plan Note (Signed)
Encouraged DASH diet, decrease po intake and increase exercise as tolerated. Needs 7-8 hours of sleep nightly. Avoid trans fats. Has had good weight loss since last visit.+

## 2016-12-22 LAB — HEPATITIS C ANTIBODY
HEP C AB: NONREACTIVE
SIGNAL TO CUT-OFF: 0.02 (ref <1.00)

## 2016-12-26 DIAGNOSIS — F909 Attention-deficit hyperactivity disorder, unspecified type: Secondary | ICD-10-CM | POA: Insufficient documentation

## 2016-12-26 NOTE — Assessment & Plan Note (Signed)
Right great toenail hurts with walking and pressure. Is referred to podiatry for further consideration. Soak in distilled white vinegar and water nightly and apply topical lamisil daily

## 2016-12-26 NOTE — Assessment & Plan Note (Signed)
Well controlled, no changes to meds. Encouraged heart healthy diet such as the DASH diet and exercise as tolerated.  °

## 2017-01-21 ENCOUNTER — Other Ambulatory Visit: Payer: Self-pay | Admitting: Family Medicine

## 2017-01-21 DIAGNOSIS — Z79899 Other long term (current) drug therapy: Secondary | ICD-10-CM

## 2017-01-21 NOTE — Telephone Encounter (Signed)
Rx request 

## 2017-01-21 NOTE — Telephone Encounter (Signed)
Copied from [place] #[id number]. Topic: Quick Communication - See Telephone Encounter >> Jan 21, 2017  1:28 PM Hewitt Shorts wrote: CRM for notification. See Telephone encounter for:  Pt is needing a refill on her hydocodone and ritalin both mg   Best number [phone]

## 2017-01-22 ENCOUNTER — Other Ambulatory Visit (INDEPENDENT_AMBULATORY_CARE_PROVIDER_SITE_OTHER): Payer: Managed Care, Other (non HMO)

## 2017-01-22 ENCOUNTER — Other Ambulatory Visit: Payer: Self-pay

## 2017-01-22 DIAGNOSIS — Z79899 Other long term (current) drug therapy: Secondary | ICD-10-CM | POA: Diagnosis not present

## 2017-01-22 MED ORDER — METHYLPHENIDATE HCL ER (LA) 30 MG PO CP24: 30.0000 mg | ORAL_CAPSULE | ORAL | 0 refills | Status: DC

## 2017-01-22 MED ORDER — HYDROCODONE-ACETAMINOPHEN 10-325 MG PO TABS: 1.0000 | ORAL_TABLET | Freq: Four times a day (QID) | ORAL | 0 refills | Status: DC | PRN

## 2017-01-22 MED ORDER — METHYLPHENIDATE HCL 10 MG PO TABS: 10.0000 mg | ORAL_TABLET | Freq: Every day | ORAL | 0 refills | Status: DC

## 2017-01-22 MED FILL — METHYLPHENIDATE LA 30 MG CA: 30 | 30 days supply | Qty: 30 | Fill #0

## 2017-01-22 MED FILL — HYDROCODON-APAP 10-325: 10-325 | 17 days supply | Qty: 70 | Fill #0

## 2017-01-22 MED FILL — METHYLPHENIDATE 10 MG TAB: 10 | 30 days supply | Qty: 30 | Fill #0

## 2017-01-22 NOTE — Telephone Encounter (Signed)
I have signed and printed but needs repeat uds

## 2017-01-22 NOTE — Telephone Encounter (Signed)
Patient notified

## 2017-01-22 NOTE — Addendum Note (Signed)
Addended by: Magdalene Molly A on: 01/22/2017 07:56 AM   Modules accepted: Orders

## 2017-01-22 NOTE — Telephone Encounter (Signed)
Requesting:Norco  & Ritalin Contract:yes [id number] risk next screen 12/21/16 Last OV:12/21/16 Next OV:06/21/17 Last Refill: Norco 12/17/16   #[id number] Ritalin 12/17/16  December 2018   Please advise

## 2017-01-22 NOTE — Addendum Note (Signed)
Addended by: Magdalene Molly A on: 01/22/2017 01:28 PM   Modules accepted: Orders

## 2017-01-27 LAB — PAIN MGMT, PROFILE 8 W/CONF, U
6 Acetylmorphine: NEGATIVE ng/mL (ref <10)
AMPHETAMINES: NEGATIVE ng/mL (ref <500)
Alcohol Metabolites: NEGATIVE ng/mL (ref <500)
BENZODIAZEPINES: NEGATIVE ng/mL (ref <100)
BUPRENORPHINE, URINE: NEGATIVE ng/mL (ref <5)
CODEINE: NEGATIVE ng/mL (ref <50)
CREATININE: 33.2 mg/dL
Cocaine Metabolite: NEGATIVE ng/mL (ref <150)
HYDROCODONE: 185 ng/mL — AB (ref <50)
HYDROMORPHONE: NEGATIVE ng/mL (ref <50)
MARIJUANA METABOLITE: NEGATIVE ng/mL (ref <20)
MDMA: NEGATIVE ng/mL (ref <500)
Morphine: NEGATIVE ng/mL (ref <50)
Norhydrocodone: 215 ng/mL — ABNORMAL HIGH (ref <50)
OXIDANT: NEGATIVE ug/mL (ref <200)
OXYCODONE: NEGATIVE ng/mL (ref <100)
Opiates: POSITIVE ng/mL — AB (ref <100)
PH: 6.69 (ref 4.5–9.0)

## 2017-02-01 ENCOUNTER — Telehealth: Payer: Self-pay

## 2017-02-01 NOTE — Telephone Encounter (Signed)
   Pt said she has a kidney or bladder infection and is asking if the doctor will call her in a antibiotic    Pharmacy  Joan Robinson patient to let her know Dr. Charlett Blake is not in due to vacation and will not be in next week.She will need to either come in and see another provider or go to an urgent care. I called her number listed unable to leave voicemail.

## 2017-02-02 ENCOUNTER — Encounter: Payer: Self-pay | Admitting: Physician Assistant

## 2017-02-02 ENCOUNTER — Ambulatory Visit: Payer: Managed Care, Other (non HMO) | Admitting: Physician Assistant

## 2017-02-02 ENCOUNTER — Other Ambulatory Visit: Payer: Self-pay

## 2017-02-02 VITALS — BP 100/62 | HR 65 | Temp 98.3°F | Resp 18 | Ht 66.0 in | Wt 171.6 lb

## 2017-02-02 DIAGNOSIS — R300 Dysuria: Secondary | ICD-10-CM

## 2017-02-02 LAB — POCT URINALYSIS DIP (MANUAL ENTRY)
Bilirubin, UA: NEGATIVE
Glucose, UA: NEGATIVE mg/dL
Ketones, POC UA: NEGATIVE mg/dL
Leukocytes, UA: NEGATIVE
Nitrite, UA: NEGATIVE
PH UA: 5 (ref 5.0–8.0)
PROTEIN UA: NEGATIVE mg/dL
RBC UA: NEGATIVE
UROBILINOGEN UA: 0.2 U/dL

## 2017-02-02 MED ORDER — PHENAZOPYRIDINE HCL 200 MG PO TABS: 200.0000 mg | ORAL_TABLET | Freq: Three times a day (TID) | ORAL | 0 refills | Status: DC | PRN

## 2017-02-02 NOTE — Progress Notes (Signed)
Subjective:    Patient ID: Joan Robinson, female    DOB: 04-Jan-1961, 57 y.o.   MRN: [id number]  Chief Complaint  Patient presents with  . Back Pain    x week   . Fever    last night   . Dysuria    also a dark color    Patient has had an off and on burning sensation when she urinated all week. Her back has been achy, but different than her normal back pain. Over the last 2-3 days, her urine has been darker and foamy. Last night she experienced fevers and chills. She has difficulty urinating and feels it "last longer" when she urinates. Denies any hematuria, nausea, vomiting, diarrhea, constipation, or difficulty defecating. She has not taken anything for her symptoms.   Bladder sling placed 7 years ago, but her bladder has begun to prolapse again. She has frequent UTIs.   Review of systems: As above.  Patient Active Problem List   Diagnosis Date Noted  . IBS (irritable bowel syndrome) 07/27/2016  . High-tone pelvic floor dysfunction 05/09/2016  . Midline cystocele 05/09/2016  . Vaginal atrophy 05/09/2016  . Bladder prolapse, female, acquired 05/07/2016  . Dystrophic nail 04/29/2016  . Fatigue 04/27/2016  . Sleep apnea 04/27/2016  . UTI (urinary tract infection) 09/11/2015  . Cataract cortical, senile, left 12/20/2014  . Rectal lesion 10/10/2014  . Other type of migraine without status migrainosus 05/06/2014  . Dyspepsia 05/06/2014  . Postmenopausal estrogen deficiency 05/06/2014  . Screening for breast cancer 05/06/2014  . Bilateral low-tension glaucoma, indeterminate stage 12/29/2013  . High myopia, bilateral 12/29/2013  . 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  . 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 Obesity 11/01/2013  . Eustachian tube dysfunction 10/08/2013  . Otalgia of left ear 10/08/2013  . 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 Arthritis 07/24/2013  . Depression with anxiety 03/08/2013  . Paresthesia 12/22/2012  . Headache(784.0) 08/17/2012  . BCC  (basal cell carcinoma of skin) 06/01/2012  . Left arm pain 05/10/2012  . Right knee pain 05/10/2012  . 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 ADD (attention deficit disorder) 04/13/2012  . Perimenopausal 01/16/2012  . Macular pucker of both eyes 10/18/2011  . Vitreous degeneration 10/18/2011  . Anemia 10/10/2011  . Preventative health care 10/10/2011  . Urticaria, chronic 10/10/2011  . 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 Lower back pain   . DIVERTICULITIS-COLON 07/25/2009  . Diverticulitis of colon 07/25/2009  . Essential hypertension 06/09/2009  . HELICOBACTER PYLORI GASTRITIS, HX OF 09/06/2008  . GERD 07/27/2008  . Bipolar disorder (HCC) 01/17/2007  . Hypothyroidism 08/24/2006  . Allergic rhinitis 08/24/2006  . URINARY INCONTINENCE 08/24/2006   Prior to Admission medications   Medication Sig Start Date End Date Taking? Authorizing Provider  albuterol (PROVENTIL HFA;VENTOLIN HFA) 108 (90 Base) MCG/ACT inhaler Inhale 2 puffs into the lungs every 6 (six) hours as needed for wheezing or shortness of breath. 05/19/15  Yes Saguier, Ramon Dredge, PA-C  ALPRAZolam Prudy Feeler) 0.25 MG tablet TAKE ONE TABLET BY MOUTH 3 TIMES DAILY AS NEEDED FOR SLEEP OR ANXIETY 12/17/16  Yes Bradd Canary, MD  Cholecalciferol (VITAMIN D3) 2000 UNITS TABS Take by mouth.   Yes [provider]  conjugated estrogens (PREMARIN) vaginal cream Place 1 Applicatorful vaginally every other day. 09/02/15  Yes Bradd Canary, MD  HYDROcodone-acetaminophen (NORCO) 10-325 MG tablet Take 1 tablet by mouth every 6 (six) hours as needed. 01/22/17  Yes Bradd Canary, MD  hydrocortisone (ANUSOL-HC) 25  MG suppository Place 1 suppository (25 mg total) rectally at bedtime. 12/17/16  Yes Bradd Canary, MD  hydrocortisone-pramoxine (ANALPRAM HC) 2.5-1 % rectal cream Place 1 application rectally 2 (two) times daily. For 10 days 09/29/14  Yes Hvozdovic, Lori P, PA-C  latanoprost (XALATAN) 0.005 % ophthalmic solution  02/06/16  Yes  [provider]  levothyroxine (SYNTHROID, LEVOTHROID) 100 MCG tablet Take 1 tablet (100 mcg total) by mouth daily before breakfast. 05/31/16  Yes Bradd Canary, MD  methylphenidate (RITALIN LA) 30 MG 24 hr capsule Take 1 capsule (30 mg total) by mouth every morning. February  2019 01/22/17  Yes Bradd Canary, MD  methylphenidate (RITALIN) 10 MG tablet Take 1 tablet (10 mg total) by mouth daily. February  2019 01/22/17  Yes Bradd Canary, MD  metoprolol tartrate (LOPRESSOR) 25 MG tablet Take 0.5 tablets (12.5 mg total) by mouth daily. 12/21/16  Yes Bradd Canary, MD  nitroGLYCERIN (NITROSTAT) 0.4 MG SL tablet Place 1 tablet (0.4 mg total) under the tongue every 5 (five) minutes as needed for chest pain (max of 3 tabs, to ER if pain persists). 10/04/16  Yes Bradd Canary, MD  ondansetron (ZOFRAN) 4 MG tablet TAKE ONE TABLET BY MOUTH EVERY 4 HOURS AS NEEDED FOR NAUSEA OR VOMITING 03/06/16  Yes Bradd Canary, MD  ranitidine (ZANTAC) 150 MG tablet Take 1 tablet (150 mg total) by mouth 2 (two) times daily. 07/27/16  Yes Bradd Canary, MD  sucralfate (CARAFATE) 1 g tablet TAKE ONE TABLET BY MOUTH FOUR (4) TIMES DAILY WITH MEALS AND AT BEDTIME prn 12/17/16  Yes Bradd Canary, MD  vitamin A 8000 UNIT capsule Take 8,000 Units by mouth daily.   Yes [provider]  zinc gluconate 50 MG tablet Take 1 tablet (50 mg total) by mouth daily. 07/27/16  Yes Bradd Canary, MD          Allergies  Allergen Reactions  . Keflex [Cephalexin] Diarrhea  . Aspirin     REACTION: difficulty breathing  . Butamben-Tetracaine-Benzocaine     Mouth sores  . Dicyclomine Hcl     REACTION: mouth ulcers  . Metronidazole     REACTION: hives, mouth ulcers  . Moxifloxacin     REACTION: increased heart rate, nausea  . Nsaids     REACTION: difficulty breathing  . Phenergan [Promethazine Hcl]     "knocks" pt out for about 3 days  . Quetiapine     Somnolence. Slept for 36 hours straight.  .  Sulfamethoxazole-Trimethoprim     REACTION: increased heart rate, nausea  . Cymbalta [Duloxetine Hcl] Rash  . Levothyroxine Palpitations  . Moxifloxacin Hcl In Nacl Palpitations  . Orphenadrine Citrate Palpitations      Objective:   Physical Exam  Constitutional: She is oriented to person, place, and time. She appears well-developed and well-nourished.  Cardiovascular: Normal rate, regular rhythm, normal heart sounds and intact distal pulses.  Pulmonary/Chest: Breath sounds normal.  Abdominal: Bowel sounds are normal. There is no tenderness (RLQ, LLQ, LUQ, suprapubic. Slight discomfort RUQ). There is no CVA tenderness.  Lymphadenopathy:    She has no cervical adenopathy.  Neurological: She is alert and oriented to person, place, and time.  Psychiatric: She has a normal mood and affect. Her behavior is normal.   Vitals:   02/02/17 1439  BP: 100/62  Pulse: 65  Resp: 18  Temp: 98.3 F (36.8 C)  SpO2: 99%      Assessment & Plan:  1. Dysuria POCT urinalysis dipstick: normal, except for low specific gravity (<1.005). Negative for blood, nitrites, or leukocytes.  Start Pyridium. Await urine culture results. Will adjust treatment as necessary.   - POCT urinalysis dipstick - Urine Culture - phenazopyridine (PYRIDIUM) 200 MG tablet; Take 1 tablet (200 mg total) by mouth 3 (three) times daily as needed for pain.  Dispense: 10 tablet; Refill: 0  Return if symptoms worsen or fail to improve.  Alfonse Alpers, PA-S

## 2017-02-02 NOTE — Patient Instructions (Addendum)
START Phenazopyridine (Pyridium) 1 tablet by mouth 3 times daily     IF you received an x-ray today, you will receive an invoice from [facility]. Please contact [facility] at [phone] with questions or concerns regarding your invoice.   IF you received labwork today, you will receive an invoice from [place]. Please contact [facility] at [phone] with questions or concerns regarding your invoice.   Our billing staff will not be able to assist you with questions regarding bills from these companies.  You will be contacted with the lab results as soon as they are available. The fastest way to get your results is to activate your My Chart account. Instructions are located on the last page of this paperwork. If you have not heard from [place] regarding the results in 2 weeks, please contact this office.

## 2017-02-02 NOTE — Progress Notes (Signed)
Patient ID: Joan Robinson, female    DOB: [birth year], 57 y.o.   MRN: [id number]  PCP: Bradd Canary, MD  Chief Complaint  Patient presents with  . Back Pain    x week   . Fever    last night   . Dysuria    also a dark color     Subjective:   Presents for evaluation of back pain, fever and darker urine with burning sensation for 1 week.  She has chronic low back pain, but this feels different. Fever/chills are subjective. Urine is foamy. Urine stream seems slower.  No nausea, vomiting, diarrhea, constipation. No hematuria. Nothing tried to alleviate her symptoms.  Bladder sling 7 years ago, but has begun to fail. Frequent UTIs.    Review of Systems As above.    Patient Active Problem List   Diagnosis Date Noted  . IBS (irritable bowel syndrome) 07/27/2016  . High-tone pelvic floor dysfunction 05/09/2016  . Midline cystocele 05/09/2016  . Vaginal atrophy 05/09/2016  . Bladder prolapse, female, acquired 05/07/2016  . Dystrophic nail 04/29/2016  . Fatigue 04/27/2016  . Sleep apnea 04/27/2016  . UTI (urinary tract infection) 09/11/2015  . Cataract cortical, senile, left 12/20/2014  . Rectal lesion 10/10/2014  . Other type of migraine without status migrainosus 05/06/2014  . Dyspepsia 05/06/2014  . Postmenopausal estrogen deficiency 05/06/2014  . Screening for breast cancer 05/06/2014  . Bilateral low-tension glaucoma, indeterminate stage 12/29/2013  . High myopia, bilateral 12/29/2013  . 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  . 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 Obesity 11/01/2013  . Eustachian tube dysfunction 10/08/2013  . Otalgia of left ear 10/08/2013  . 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 Arthritis 07/24/2013  . Depression with anxiety 03/08/2013  . Paresthesia 12/22/2012  . Headache(784.0) 08/17/2012  . BCC (basal cell carcinoma of skin) 06/01/2012  . Left arm pain 05/10/2012  . Right knee pain 05/10/2012  . Freiberg's  disease 04/13/2012  . ADD (attention deficit disorder) 04/13/2012  . Perimenopausal 01/16/2012  . Macular pucker of both eyes 10/18/2011  . Vitreous degeneration 10/18/2011  . Anemia 10/10/2011  . Preventative health care 10/10/2011  . Urticaria, chronic 10/10/2011  . 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 Lower back pain   . DIVERTICULITIS-COLON 07/25/2009  . Diverticulitis of colon 07/25/2009  . Essential hypertension 06/09/2009  . HELICOBACTER PYLORI GASTRITIS, HX OF 09/06/2008  . GERD 07/27/2008  . Bipolar disorder (HCC) 01/17/2007  . Hypothyroidism 08/24/2006  . Allergic rhinitis 08/24/2006  . URINARY INCONTINENCE 08/24/2006     Prior to Admission medications   Medication Sig Start Date End Date Taking? Authorizing Provider  albuterol (PROVENTIL HFA;VENTOLIN HFA) 108 (90 Base) MCG/ACT inhaler Inhale 2 puffs into the lungs every 6 (six) hours as needed for wheezing or shortness of breath. 05/19/15  Yes Saguier, Ramon Dredge, PA-C  ALPRAZolam Prudy Feeler) 0.25 MG tablet TAKE ONE TABLET BY MOUTH 3 TIMES DAILY AS NEEDED FOR SLEEP OR ANXIETY 12/17/16  Yes Bradd Canary, MD  Cholecalciferol (VITAMIN D3) 2000 UNITS TABS Take by mouth.   Yes [provider]  conjugated estrogens (PREMARIN) vaginal cream Place 1 Applicatorful vaginally every other day. 09/02/15  Yes Bradd Canary, MD  HYDROcodone-acetaminophen (NORCO) 10-325 MG tablet Take 1 tablet by mouth every 6 (six) hours as needed. 01/22/17  Yes Bradd Canary, MD  hydrocortisone (ANUSOL-HC) 25 MG suppository Place 1 suppository (25 mg total) rectally at bedtime. 12/17/16  Yes Bradd Canary, MD  hydrocortisone-pramoxine (ANALPRAM HC) 2.5-1 % rectal cream Place 1 application rectally 2 (two) times daily. For 10 days 09/29/14  Yes Hvozdovic, Lori P, PA-C  latanoprost (XALATAN) 0.005 % ophthalmic solution  02/06/16  Yes [provider]  levothyroxine (SYNTHROID, LEVOTHROID) 100 MCG tablet Take 1 tablet (100 mcg total) by mouth  daily before breakfast. 05/31/16  Yes Bradd Canary, MD  methylphenidate (RITALIN LA) 30 MG 24 hr capsule Take 1 capsule (30 mg total) by mouth every morning. February  2019 01/22/17  Yes Bradd Canary, MD  methylphenidate (RITALIN) 10 MG tablet Take 1 tablet (10 mg total) by mouth daily. February  2019 01/22/17  Yes Bradd Canary, MD  metoprolol tartrate (LOPRESSOR) 25 MG tablet Take 0.5 tablets (12.5 mg total) by mouth daily. 12/21/16  Yes Bradd Canary, MD  nitroGLYCERIN (NITROSTAT) 0.4 MG SL tablet Place 1 tablet (0.4 mg total) under the tongue every 5 (five) minutes as needed for chest pain (max of 3 tabs, to ER if pain persists). 10/04/16  Yes Bradd Canary, MD  ondansetron (ZOFRAN) 4 MG tablet TAKE ONE TABLET BY MOUTH EVERY 4 HOURS AS NEEDED FOR NAUSEA OR VOMITING 03/06/16  Yes Bradd Canary, MD  ranitidine (ZANTAC) 150 MG tablet Take 1 tablet (150 mg total) by mouth 2 (two) times daily. 07/27/16  Yes Bradd Canary, MD  sucralfate (CARAFATE) 1 g tablet TAKE ONE TABLET BY MOUTH FOUR (4) TIMES DAILY WITH MEALS AND AT BEDTIME prn 12/17/16  Yes Bradd Canary, MD  vitamin A 8000 UNIT capsule Take 8,000 Units by mouth daily.   Yes [provider]  zinc gluconate 50 MG tablet Take 1 tablet (50 mg total) by mouth daily. 07/27/16  Yes Bradd Canary, MD     Allergies  Allergen Reactions  . Keflex [Cephalexin] Diarrhea  . Aspirin     REACTION: difficulty breathing  . Butamben-Tetracaine-Benzocaine     Mouth sores  . Dicyclomine Hcl     REACTION: mouth ulcers  . Metronidazole     REACTION: hives, mouth ulcers  . Moxifloxacin     REACTION: increased heart rate, nausea  . Nsaids     REACTION: difficulty breathing  . Phenergan [Promethazine Hcl]     "knocks" pt out for about 3 days  . Quetiapine     Somnolence. Slept for 36 hours straight.  . Sulfamethoxazole-Trimethoprim     REACTION: increased heart rate, nausea  . Cymbalta [Duloxetine Hcl] Rash  . Levothyroxine  Palpitations  . Moxifloxacin Hcl In Nacl Palpitations  . Orphenadrine Citrate Palpitations       Objective:  Physical Exam  Constitutional: She is oriented to person, place, and time. She appears well-developed and well-nourished. She is active and cooperative. No distress.  BP 100/62   Pulse 65   Temp 98.3 F (36.8 C) (Oral)   Resp 18   Ht 5\' 6"  (1.676 m)   Wt 171 lb 9.6 oz (77.8 kg)   SpO2 99%   BMI 27.70 kg/m   HENT:  Head: Normocephalic and atraumatic.  Right Ear: Hearing normal.  Left Ear: Hearing normal.  Eyes: Conjunctivae are normal. No scleral icterus.  Neck: Normal range of motion. Neck supple. No thyromegaly present.  Cardiovascular: Normal rate, regular rhythm and normal heart sounds.  Pulses:      Radial pulses are 2+ on the right side, and 2+ on the left side.  Pulmonary/Chest: Effort normal and breath sounds normal.  Abdominal: Soft. Normal appearance  and bowel sounds are normal. There is no hepatosplenomegaly. There is tenderness (mild) in the right upper quadrant. There is no CVA tenderness.  Lymphadenopathy:       Head (right side): No tonsillar, no preauricular, no posterior auricular and no occipital adenopathy present.       Head (left side): No tonsillar, no preauricular, no posterior auricular and no occipital adenopathy present.    She has no cervical adenopathy.       Right: No supraclavicular adenopathy present.       Left: No supraclavicular adenopathy present.  Neurological: She is alert and oriented to person, place, and time. No sensory deficit.  Skin: Skin is warm, dry and intact. No rash noted. No cyanosis or erythema. Nails show no clubbing.  Psychiatric: She has a normal mood and affect. Her speech is normal and behavior is normal.       Results for orders placed or performed in visit on 02/02/17  POCT urinalysis dipstick  Result Value Ref Range   Color, UA yellow yellow   Clarity, UA clear clear   Glucose, UA negative negative mg/dL     Bilirubin, UA negative negative   Ketones, POC UA negative negative mg/dL   Spec Grav, UA <=4.098 (A) 1.010 - 1.025   Blood, UA negative negative   pH, UA 5.0 5.0 - 8.0   Protein Ur, POC negative negative mg/dL   Urobilinogen, UA 0.2 0.2 or 1.0 E.U./dL   Nitrite, UA Negative Negative   Leukocytes, UA Negative Negative       Assessment & Plan:   1. Dysuria Supportive care while awaiting UCx. - POCT urinalysis dipstick - Urine Culture - phenazopyridine (PYRIDIUM) 200 MG tablet; Take 1 tablet (200 mg total) by mouth 3 (three) times daily as needed for pain.  Dispense: 10 tablet; Refill: 0    Return if symptoms worsen or fail to improve.   Fernande Bras, PA-C Primary Care at [facility]

## 2017-02-03 LAB — URINE CULTURE: ORGANISM ID, BACTERIA: NO GROWTH

## 2017-02-05 ENCOUNTER — Telehealth: Payer: Self-pay | Admitting: Physician Assistant

## 2017-02-05 NOTE — Telephone Encounter (Signed)
Could you review labs and release to MyChart?

## 2017-02-05 NOTE — Telephone Encounter (Signed)
Copied from [place]. Topic: Quick Communication - See Telephone Encounter >> Feb 05, 2017  4:49 PM Bea Graff, NT wrote: CRM for notification. See Telephone encounter for: Pt calling to get lab results from Saturday.  02/05/17.

## 2017-02-05 NOTE — Telephone Encounter (Signed)
Released results. UCx NO GROWTH. Patient advised to re-evaluate if symptoms persist.

## 2017-02-26 ENCOUNTER — Other Ambulatory Visit: Payer: Self-pay | Admitting: Family Medicine

## 2017-02-26 MED ORDER — HYDROCODONE-ACETAMINOPHEN 10-325 MG PO TABS: 1.0000 | ORAL_TABLET | Freq: Four times a day (QID) | ORAL | 0 refills | Status: DC | PRN

## 2017-02-26 NOTE — Telephone Encounter (Signed)
It appears she was seen at [facility] for an acute set of symptoms I do not think she has switched she can have a refill

## 2017-02-26 NOTE — Telephone Encounter (Signed)
Requesting:hydrocodone Contract:02/28/16  UDS:01/22/17 moderate risk Last Visit:02/02/17 Next Visit:04/22/17 Last Refill:01/22/17  Please Advise  Patient seen on 02/02/17 Dr. Otis Dials medicine. Did patient move offices?

## 2017-03-04 ENCOUNTER — Other Ambulatory Visit: Payer: Self-pay | Admitting: Family Medicine

## 2017-03-04 MED ORDER — HYDROCODONE-ACETAMINOPHEN 10-325 MG PO TABS: 1.0000 | ORAL_TABLET | Freq: Four times a day (QID) | ORAL | 0 refills | Status: DC | PRN

## 2017-03-04 NOTE — Telephone Encounter (Signed)
Patient came in to pickup rx and it was not ready for pickup.  It looks like you may have printed at home late.  Can you send in electronically from home?

## 2017-03-04 NOTE — Telephone Encounter (Signed)
Sent to pharmacy 

## 2017-04-02 ENCOUNTER — Other Ambulatory Visit: Payer: Self-pay | Admitting: Family Medicine

## 2017-04-03 NOTE — Telephone Encounter (Signed)
I refilled but needs updated contract at visit next month

## 2017-04-03 NOTE — Telephone Encounter (Signed)
Requesting: NORCO  10-325 MG Contract: 02/28/16 UDS: 01/22/17 Moderate Risk Last OV: 12/21/16 Next OV: 04/22/17 Last Refill:  03/04/17  #[id number]   Please advise

## 2017-04-22 ENCOUNTER — Encounter: Payer: Self-pay | Admitting: Family Medicine

## 2017-04-22 ENCOUNTER — Ambulatory Visit: Payer: Managed Care, Other (non HMO) | Admitting: Family Medicine

## 2017-04-22 VITALS — BP 104/72 | HR 58 | Temp 97.8°F | Resp 16 | Wt 173.8 lb

## 2017-04-22 DIAGNOSIS — I10 Essential (primary) hypertension: Secondary | ICD-10-CM | POA: Diagnosis not present

## 2017-04-22 DIAGNOSIS — E079 Disorder of thyroid, unspecified: Secondary | ICD-10-CM

## 2017-04-22 DIAGNOSIS — Z79899 Other long term (current) drug therapy: Secondary | ICD-10-CM | POA: Diagnosis not present

## 2017-04-22 DIAGNOSIS — F988 Other specified behavioral and emotional disorders with onset usually occurring in childhood and adolescence: Secondary | ICD-10-CM | POA: Diagnosis not present

## 2017-04-22 DIAGNOSIS — F410 Panic disorder [episodic paroxysmal anxiety] without agoraphobia: Secondary | ICD-10-CM | POA: Diagnosis not present

## 2017-04-22 DIAGNOSIS — E039 Hypothyroidism, unspecified: Secondary | ICD-10-CM

## 2017-04-22 DIAGNOSIS — J309 Allergic rhinitis, unspecified: Secondary | ICD-10-CM

## 2017-04-22 DIAGNOSIS — M549 Dorsalgia, unspecified: Secondary | ICD-10-CM

## 2017-04-22 DIAGNOSIS — D649 Anemia, unspecified: Secondary | ICD-10-CM

## 2017-04-22 DIAGNOSIS — M1990 Unspecified osteoarthritis, unspecified site: Secondary | ICD-10-CM

## 2017-04-22 DIAGNOSIS — F32A Depression, unspecified: Secondary | ICD-10-CM

## 2017-04-22 DIAGNOSIS — F329 Major depressive disorder, single episode, unspecified: Secondary | ICD-10-CM | POA: Diagnosis not present

## 2017-04-22 MED ORDER — METHYLPHENIDATE HCL 10 MG PO TABS: 10.0000 mg | ORAL_TABLET | Freq: Every day | ORAL | 0 refills | Status: DC

## 2017-04-22 MED ORDER — METHYLPHENIDATE HCL ER (LA) 30 MG PO CP24: 30.0000 mg | ORAL_CAPSULE | ORAL | 0 refills | Status: DC

## 2017-04-22 MED ORDER — METHYLPHENIDATE HCL 10 MG PO TABS: 10.0000 mg | ORAL_TABLET | Freq: Two times a day (BID) | ORAL | 0 refills | Status: DC

## 2017-04-22 MED ORDER — METHYLPHENIDATE HCL ER (LA) 30 MG PO CP24
30.0000 mg | ORAL_CAPSULE | ORAL | 0 refills | Status: DC
Start: 2017-04-22 — End: 2017-04-22

## 2017-04-22 MED ORDER — HYDROCODONE-ACETAMINOPHEN 10-325 MG PO TABS: 1.0000 | ORAL_TABLET | Freq: Four times a day (QID) | ORAL | 0 refills | Status: DC | PRN

## 2017-04-22 MED ORDER — ALPRAZOLAM 0.25 MG PO TABS: ORAL_TABLET | ORAL | 2 refills | Status: DC

## 2017-04-22 NOTE — Progress Notes (Signed)
Subjective:  I acted as a Neurosurgeon for [employer] Fuller Song, RMA   Patient ID: Joan Robinson, female    DOB: 1960-11-29, 57 y.o.   MRN: [id number]  No chief complaint on file.   HPI  Patient is in today for medication refill.and overall she is doing well. No recently ferile illness or hospitalizations. Denies CP/palp/SOB/HA/congestion/fevers/GI or GU c/o. Taking meds as prescribed  Patient Care Team: Bradd Canary, MD as PCP - General (Family Medicine) Sherrie George, MD as Consulting Physician (Ophthalmology) Barrett Henle, MD (Ophthalmology) Orpah Cobb, MD as Consulting Physician (Cardiology) Donnetta Hail, MD as Consulting Physician (Rheumatology) Jerrell Mylar, MD as Consulting Physician (Gynecology)   Past Medical History:  Diagnosis Date  . Adenomatous colon polyp   . Allergic rhinitis   . Anemia 10/10/2011  . Anxiety and depression 01/17/2007   Qualifier: Diagnosis of  By: Everardo All MD, Cleophas Dunker   . Arthritis 07/24/2013   Likely inflammatory and following with Dr Maryln Gottron of [facility]  rheumatology  . 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 BCC (basal cell carcinoma of skin) 06/01/2012   Leg Follows with Dr Margo Aye  . Bipolar disorder (HCC) 01/17/2007   Qualifier: Diagnosis of  By: Everardo All MD, Cleophas Dunker   . Chicken pox as a child   X 2  . Colitis, Clostridium difficile 5/11,6/11  . Colon polyp    benign  . Diverticulosis   . Diverticulosis   . 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 Headache(784.0) 08/17/2012  . Hyperlipidemia   . Hypertension   . Hypothyroidism   . Hypothyroidism 08/24/2006   Qualifier: Diagnosis of  By: Everardo All MD, Sean A    . IBS (irritable bowel syndrome) 07/27/2016  . Lower back pain   . 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 Mumps as a child  . Obesity 11/01/2013  . Perimenopausal 01/16/2012  . Preventative health care 10/10/2011  . PUD (peptic ulcer disease)   . Right knee pain 05/10/2012  . Sinusitis acute  10/10/2011  . Sleep apnea 04/27/2016  . Staph aureus infection 12/18/2011   Recurrent lesions in nares  . Tobacco abuse   . Urinary incontinence     Past Surgical History:  Procedure Laterality Date  . ABDOMINAL HYSTERECTOMY    . ABDOMINAL HYSTERECTOMY  2011   complete  . COLONOSCOPY    . DILATION AND CURETTAGE OF UTERUS  1985  . EYE SURGERY  03/03/14   Surgery on right eye for epiretinal membrane (vitreous peel)   . Gated Spect wall motion stress cardiolite  11/05/2001  . POLYPECTOMY    . TUBAL LIGATION  1995  . UTERINE SUSPENSION     mesh  . VITRECTOMY Bilateral 03/03/14    Family History  Problem Relation Age of Onset  . Heart disease Mother        stents  . Stroke Mother   . Hyperlipidemia Mother   . Hypertension Mother   . Other Mother        blood disorder- mgus  . Colon polyps Father   . Other Father        aorta disection  . Aneurysm Father   . Irritable bowel syndrome Daughter   . Other Daughter        gastritis  . Mental illness Daughter        bipolar and mood disorder  . Hypertension Sister        ?  Marland Kitchen Mental illness Son  bipolar  . Arthritis Sister   . Other Sister        thyroid  . Other Sister        thyroid  . Colon cancer Paternal Uncle     Social History   Socioeconomic History  . Marital status: Married    Spouse name: Not on file  . Number of children: 2  . Years of education: Not on file  . Highest education level: Not on file  Occupational History  . Occupation: Aeronautical engineer  Social Needs  . Financial resource strain: Not on file  . Food insecurity:    Worry: Not on file    Inability: Not on file  . Transportation needs:    Medical: Not on file    Non-medical: Not on file  Tobacco Use  . Smoking status: Former Smoker    Packs/day: 1.00    Years: 30.00    Pack years: 30.00    Types: Cigarettes    Last attempt to quit: 05/15/2009    Years since quitting: 7.9  . Smokeless tobacco: Never Used  Substance and  Sexual Activity  . Alcohol use: No  . Drug use: No  . Sexual activity: Never  Lifestyle  . Physical activity:    Days per week: Not on file    Minutes per session: Not on file  . Stress: Not on file  Relationships  . Social connections:    Talks on phone: Not on file    Gets together: Not on file    Attends religious service: Not on file    Active member of club or organization: Not on file    Attends meetings of clubs or organizations: Not on file    Relationship status: Not on file  . Intimate partner violence:    Fear of current or ex partner: Not on file    Emotionally abused: Not on file    Physically abused: Not on file    Forced sexual activity: Not on file  Other Topics Concern  . Not on file  Social History Narrative  . Not on file    Outpatient Medications Prior to Visit  Medication Sig Dispense Refill  . albuterol (PROVENTIL HFA;VENTOLIN HFA) 108 (90 Base) MCG/ACT inhaler Inhale 2 puffs into the lungs every 6 (six) hours as needed for wheezing or shortness of breath. 1 Inhaler 0  . Cholecalciferol (VITAMIN D3) 2000 UNITS TABS Take by mouth.    . conjugated estrogens (PREMARIN) vaginal cream Place 1 Applicatorful vaginally every other day. 30 g 5  . hydrocortisone (ANUSOL-HC) 25 MG suppository Place 1 suppository (25 mg total) rectally at bedtime. 12 suppository 0  . hydrocortisone-pramoxine (ANALPRAM HC) 2.5-1 % rectal cream Place 1 application rectally 2 (two) times daily. For 10 days 30 g 1  . latanoprost (XALATAN) 0.005 % ophthalmic solution     . levothyroxine (SYNTHROID, LEVOTHROID) 100 MCG tablet Take 1 tablet (100 mcg total) by mouth daily before breakfast. 30 tablet 6  . metoprolol tartrate (LOPRESSOR) 25 MG tablet Take 0.5 tablets (12.5 mg total) by mouth daily. 45 tablet 1  . nitroGLYCERIN (NITROSTAT) 0.4 MG SL tablet Place 1 tablet (0.4 mg total) under the tongue every 5 (five) minutes as needed for chest pain (max of 3 tabs, to ER if pain persists). 25  tablet 0  . ondansetron (ZOFRAN) 4 MG tablet TAKE ONE TABLET BY MOUTH EVERY 4 HOURS AS NEEDED FOR NAUSEA OR VOMITING 30 tablet 3  . phenazopyridine (PYRIDIUM) 200  MG tablet Take 1 tablet (200 mg total) by mouth 3 (three) times daily as needed for pain. 10 tablet 0  . ranitidine (ZANTAC) 150 MG tablet Take 1 tablet (150 mg total) by mouth 2 (two) times daily. 180 tablet 1  . sucralfate (CARAFATE) 1 g tablet TAKE ONE TABLET BY MOUTH FOUR (4) TIMES DAILY WITH MEALS AND AT BEDTIME prn 120 tablet 0  . vitamin A 8000 UNIT capsule Take 8,000 Units by mouth daily.    Marland Kitchen zinc gluconate 50 MG tablet Take 1 tablet (50 mg total) by mouth daily. 30 tablet 2  . ALPRAZolam (XANAX) 0.25 MG tablet TAKE ONE TABLET BY MOUTH 3 TIMES DAILY AS NEEDED FOR SLEEP OR ANXIETY 30 tablet 0  . HYDROcodone-acetaminophen (NORCO) 10-325 MG tablet TAKE ONE TABLET BY MOUTH EVERY 6 HOURS AS NEEDED 70 tablet 0  . methylphenidate (RITALIN LA) 30 MG 24 hr capsule Take 1 capsule (30 mg total) by mouth every morning. February  2019 30 capsule 0  . methylphenidate (RITALIN) 10 MG tablet Take 1 tablet (10 mg total) by mouth daily. February  2019 30 tablet 0   No facility-administered medications prior to visit.     Allergies  Allergen Reactions  . Keflex [Cephalexin] Diarrhea  . Aspirin     REACTION: difficulty breathing  . Butamben-Tetracaine-Benzocaine     Mouth sores  . Dicyclomine Hcl     REACTION: mouth ulcers  . Metronidazole     REACTION: hives, mouth ulcers  . Moxifloxacin     REACTION: increased heart rate, nausea  . Nsaids     REACTION: difficulty breathing  . Phenergan [Promethazine Hcl]     "knocks" pt out for about 3 days  . Quetiapine     Somnolence. Slept for 36 hours straight.  . Sulfamethoxazole-Trimethoprim     REACTION: increased heart rate, nausea  . Cymbalta [Duloxetine Hcl] Rash  . Levothyroxine Palpitations  . Moxifloxacin Hcl In Nacl Palpitations  . Orphenadrine Citrate Palpitations    Review  of Systems  Constitutional: Positive for malaise/fatigue. Negative for fever.  HENT: Negative for congestion.   Eyes: Negative for blurred vision.  Respiratory: Negative for shortness of breath.   Cardiovascular: Negative for chest pain, palpitations and leg swelling.  Gastrointestinal: Negative for abdominal pain, blood in stool and nausea.  Genitourinary: Negative for dysuria and frequency.  Musculoskeletal: Positive for back pain and joint pain. Negative for falls.  Skin: Negative for rash.  Neurological: Negative for dizziness, loss of consciousness and headaches.  Endo/Heme/Allergies: Negative for environmental allergies.  Psychiatric/Behavioral: Positive for depression. The patient is nervous/anxious.        Objective:    Physical Exam  Constitutional: She is oriented to person, place, and time. No distress.  HENT:  Head: Normocephalic and atraumatic.  Eyes: Conjunctivae are normal.  Neck: Neck supple. No thyromegaly present.  Cardiovascular: Normal rate, regular rhythm and normal heart sounds.  No murmur heard. Pulmonary/Chest: Effort normal and breath sounds normal. She has no wheezes.  Abdominal: She exhibits no distension and no mass.  Musculoskeletal: She exhibits no edema.  Lymphadenopathy:    She has no cervical adenopathy.  Neurological: She is alert and oriented to person, place, and time.  Skin: Skin is warm and dry. No rash noted. She is not diaphoretic.  Psychiatric: Judgment normal.    BP 104/72 (BP Location: Left Arm, Patient Position: Sitting, Cuff Size: Normal)   Pulse (!) 58   Temp 97.8 F (36.6 C) (Oral)  Resp 16   Wt 173 lb 12.8 oz (78.8 kg)   SpO2 98%   BMI 28.05 kg/m  Wt Readings from Last 3 Encounters:  04/22/17 173 lb 12.8 oz (78.8 kg)  02/02/17 171 lb 9.6 oz (77.8 kg)  12/21/16 168 lb 3.2 oz (76.3 kg)   BP Readings from Last 3 Encounters:  04/22/17 104/72  02/02/17 100/62  12/21/16 114/68     Immunization History  Administered  Date(s) Administered  . Influenza Split 10/16/2010, 10/16/2011, 10/25/2015  . Influenza-Unspecified 11/07/2012, 10/21/2013, 10/19/2014  . Pneumococcal Conjugate-13 01/23/2013  . Td 07/04/2004  . Tdap 12/28/2014    Health Maintenance  Topic Date Due  . HIV Screening  12/16/1975  . PAP SMEAR  01/15/2014  . INFLUENZA VACCINE  08/15/2017  . MAMMOGRAM  04/28/2018  . COLONOSCOPY  10/19/2021  . TETANUS/TDAP  12/27/2024  . Hepatitis C Screening  Completed    Lab Results  Component Value Date   WBC 4.3 12/21/2016   HGB 11.8 (L) 12/21/2016   HCT 36.1 12/21/2016   PLT 189.0 12/21/2016   GLUCOSE 96 12/21/2016   CHOL 155 12/21/2016   TRIG 117.0 12/21/2016   HDL 56.20 12/21/2016   LDLDIRECT 104.0 04/08/2015   LDLCALC 76 12/21/2016   ALT 13 12/21/2016   AST 16 12/21/2016   NA 139 12/21/2016   K 4.0 12/21/2016   CL 105 12/21/2016   CREATININE 0.86 12/21/2016   BUN 16 12/21/2016   CO2 29 12/21/2016   TSH 0.32 (L) 12/21/2016   INR 1.0 09/02/2008   HGBA1C 5.4 09/02/2015    Lab Results  Component Value Date   TSH 0.32 (L) 12/21/2016   Lab Results  Component Value Date   WBC 4.3 12/21/2016   HGB 11.8 (L) 12/21/2016   HCT 36.1 12/21/2016   MCV 89.4 12/21/2016   PLT 189.0 12/21/2016   Lab Results  Component Value Date   NA 139 12/21/2016   K 4.0 12/21/2016   CO2 29 12/21/2016   GLUCOSE 96 12/21/2016   BUN 16 12/21/2016   CREATININE 0.86 12/21/2016   BILITOT 0.4 12/21/2016   ALKPHOS 65 12/21/2016   AST 16 12/21/2016   ALT 13 12/21/2016   PROT 6.3 12/21/2016   ALBUMIN 4.3 12/21/2016   CALCIUM 8.9 12/21/2016   GFR 72.54 12/21/2016   Lab Results  Component Value Date   CHOL 155 12/21/2016   Lab Results  Component Value Date   HDL 56.20 12/21/2016   Lab Results  Component Value Date   LDLCALC 76 12/21/2016   Lab Results  Component Value Date   TRIG 117.0 12/21/2016   Lab Results  Component Value Date   CHOLHDL 3 12/21/2016   Lab Results  Component Value  Date   HGBA1C 5.4 09/02/2015         Assessment & Plan:   Problem List Items Addressed This Visit    Hypothyroidism    On Levothyroxine, continue to monitor      Essential hypertension    Well controlled, no changes to meds. Encouraged heart healthy diet such as the DASH diet and exercise as tolerated.       Anxiety attack    Refill allowed on bezo to use prn      Relevant Medications   ALPRAZolam (XANAX) 0.25 MG tablet   Allergic rhinitis    flonase and Zyrtec daily prn      Anemia   ADD (attention deficit disorder)    She is struggling with fatigue  and poor concentration and would like to restart Ritalin LA 30 mg qd and Rialin 10 mg q pm prn. Given refills todya      Relevant Medications   methylphenidate (RITALIN) 10 MG tablet   methylphenidate (RITALIN LA) 30 MG 24 hr capsule   methylphenidate (RITALIN LA) 30 MG 24 hr capsule   methylphenidate (RITALIN) 10 MG tablet   Arthritis    Allowed refill on pain meds uds and contract utd      Relevant Medications   HYDROcodone-acetaminophen (NORCO) 10-325 MG tablet    Other Visit Diagnoses    High risk medication use    -  Primary   Relevant Orders   Pain Mgmt, Profile 8 w/Conf, U   Depression, unspecified depression type       Relevant Medications   ALPRAZolam (XANAX) 0.25 MG tablet   Back pain, unspecified back location, unspecified back pain laterality, unspecified chronicity       Relevant Medications   HYDROcodone-acetaminophen (NORCO) 10-325 MG tablet   Thyroid disease       Relevant Orders   TSH   CBC      I have discontinued Kerrie Buffalo. Trombetta's methylphenidate, methylphenidate, and HYDROcodone-acetaminophen. I have also changed her methylphenidate, methylphenidate, methylphenidate, methylphenidate, and HYDROcodone-acetaminophen. Additionally, I am having her maintain her vitamin A, Vitamin D3, hydrocortisone-pramoxine, albuterol, conjugated estrogens, latanoprost, ondansetron, levothyroxine, zinc  gluconate, ranitidine, nitroGLYCERIN, hydrocortisone, sucralfate, metoprolol tartrate, phenazopyridine, and ALPRAZolam.  Meds ordered this encounter  Medications  . DISCONTD: methylphenidate (RITALIN LA) 30 MG 24 hr capsule    Sig: Take 1 capsule (30 mg total) by mouth every morning. February  2019    Dispense:  30 capsule    Refill:  0  . DISCONTD: ALPRAZolam (XANAX) 0.25 MG tablet    Sig: TAKE ONE TABLET BY MOUTH 3 TIMES DAILY AS NEEDED FOR SLEEP OR ANXIETY    Dispense:  30 tablet    Refill:  2  . DISCONTD: HYDROcodone-acetaminophen (NORCO) 10-325 MG tablet    Sig: Take 1 tablet by mouth every 6 (six) hours as needed.    Dispense:  70 tablet    Refill:  0  . DISCONTD: methylphenidate (RITALIN) 10 MG tablet    Sig: Take 1 tablet (10 mg total) by mouth daily. February  2019    Dispense:  30 tablet    Refill:  0  . DISCONTD: methylphenidate (RITALIN) 10 MG tablet    Sig: Take 1 tablet (10 mg total) by mouth 2 (two) times daily.    Dispense:  30 tablet    Refill:  0  . DISCONTD: methylphenidate (RITALIN LA) 30 MG 24 hr capsule    Sig: Take 1 capsule (30 mg total) by mouth every morning.    Dispense:  30 capsule    Refill:  0  . methylphenidate (RITALIN) 10 MG tablet    Sig: Take 1 tablet (10 mg total) by mouth daily. April 2019    Dispense:  30 tablet    Refill:  0  . methylphenidate (RITALIN LA) 30 MG 24 hr capsule    Sig: Take 1 capsule (30 mg total) by mouth every morning. April 2019    Dispense:  30 capsule    Refill:  0  . methylphenidate (RITALIN LA) 30 MG 24 hr capsule    Sig: Take 1 capsule (30 mg total) by mouth every morning. May 2019    Dispense:  30 capsule    Refill:  0  . methylphenidate (RITALIN) 10  MG tablet    Sig: Take 1 tablet (10 mg total) by mouth daily. May 2019    Dispense:  30 tablet    Refill:  0  . ALPRAZolam (XANAX) 0.25 MG tablet    Sig: TAKE ONE TABLET BY MOUTH 3 TIMES DAILY AS NEEDED FOR SLEEP OR ANXIETY    Dispense:  30 tablet    Refill:  2    . HYDROcodone-acetaminophen (NORCO) 10-325 MG tablet    Sig: Take 1 tablet by mouth every 6 (six) hours as needed for moderate pain.    Dispense:  70 tablet    Refill:  0    CMA served as scribe during this visit. History, Physical and Plan performed by medical provider. Documentation and orders reviewed and attested to.  Danise Edge, MD

## 2017-04-22 NOTE — Patient Instructions (Signed)
Anemia Anemia is a condition in which you do not have enough red blood cells or hemoglobin. Hemoglobin is a substance in red blood cells that carries oxygen. When you do not have enough red blood cells or hemoglobin (are anemic), your body cannot get enough oxygen and your organs may not work properly. As a result, you may feel very tired or have other problems. What are the causes? Common causes of anemia include:  Excessive bleeding. Anemia can be caused by excessive bleeding inside or outside the body, including bleeding from the intestine or from periods in women.  Poor nutrition.  Long-lasting (chronic) kidney, thyroid, and liver disease.  Bone marrow disorders.  Cancer and treatments for cancer.  HIV (human immunodeficiency virus) and AIDS (acquired immunodeficiency syndrome).  Treatments for HIV and AIDS.  Spleen problems.  Blood disorders.  Infections, medicines, and autoimmune disorders that destroy red blood cells.  What are the signs or symptoms? Symptoms of this condition include:  Minor weakness.  Dizziness.  Headache.  Feeling heartbeats that are irregular or faster than normal (palpitations).  Shortness of breath, especially with exercise.  Paleness.  Cold sensitivity.  Indigestion.  Nausea.  Difficulty sleeping.  Difficulty concentrating.  Symptoms may occur suddenly or develop slowly. If your anemia is mild, you may not have symptoms. How is this diagnosed? This condition is diagnosed based on:  Blood tests.  Your medical history.  A physical exam.  Bone marrow biopsy.  Your health care provider may also check your stool (feces) for blood and may do additional testing to look for the cause of your bleeding. You may also have other tests, including:  Imaging tests, such as a CT scan or MRI.  Endoscopy.  Colonoscopy.  How is this treated? Treatment for this condition depends on the cause. If you continue to lose a lot of blood,  you may need to be treated at a hospital. Treatment may include:  Taking supplements of iron, vitamin B12, or folic acid.  Taking a hormone medicine (erythropoietin) that can help to stimulate red blood cell growth.  Having a blood transfusion. This may be needed if you lose a lot of blood.  Making changes to your diet.  Having surgery to remove your spleen.  Follow these instructions at home:  Take over-the-counter and prescription medicines only as told by your health care provider.  Take supplements only as told by your health care provider.  Follow any diet instructions that you were given.  Keep all follow-up visits as told by your health care provider. This is important. Contact a health care provider if:  You develop new bleeding anywhere in the body. Get help right away if:  You are very weak.  You are short of breath.  You have pain in your abdomen or chest.  You are dizzy or feel faint.  You have trouble concentrating.  You have bloody or black, tarry stools.  You vomit repeatedly or you vomit up blood. Summary  Anemia is a condition in which you do not have enough red blood cells or enough of a substance in your red blood cells that carries oxygen (hemoglobin).  Symptoms may occur suddenly or develop slowly.  If your anemia is mild, you may not have symptoms.  This condition is diagnosed with blood tests as well as a medical history and physical exam. Other tests may be needed.  Treatment for this condition depends on the cause of the anemia. This information is not intended to replace advice   given to you by your health care provider. Make sure you discuss any questions you have with your health care provider. Document Released: 02/09/2004 Document Revised: 02/03/2016 Document Reviewed: 02/03/2016 Elsevier Interactive Patient Education  [employer].

## 2017-04-22 NOTE — Assessment & Plan Note (Signed)
On Levothyroxine, continue to monitor 

## 2017-04-22 NOTE — Assessment & Plan Note (Signed)
Refill allowed on bezo to use prn

## 2017-04-22 NOTE — Assessment & Plan Note (Signed)
Well controlled, no changes to meds. Encouraged heart healthy diet such as the DASH diet and exercise as tolerated.  °

## 2017-04-22 NOTE — Assessment & Plan Note (Signed)
Allowed refill on pain meds uds and contract utd

## 2017-04-22 NOTE — Assessment & Plan Note (Signed)
flonase and Zyrtec daily prn

## 2017-04-22 NOTE — Assessment & Plan Note (Signed)
She is struggling with fatigue and poor concentration and would like to restart Ritalin LA 30 mg qd and Rialin 10 mg q pm prn. Given refills todya

## 2017-04-23 LAB — CBC
HEMATOCRIT: 36.7 % (ref 36.0–46.0)
HEMOGLOBIN: 12.3 g/dL (ref 12.0–15.0)
MCHC: 33.4 g/dL (ref 30.0–36.0)
MCV: 89.4 fl (ref 78.0–100.0)
Platelets: 205 10*3/uL (ref 150.0–400.0)
RBC: 4.11 Mil/uL (ref 3.87–5.11)
RDW: 12.8 % (ref 11.5–15.5)
WBC: 4.8 10*3/uL (ref 4.0–10.5)

## 2017-04-23 LAB — TSH: TSH: 1.09 u[IU]/mL (ref 0.35–4.50)

## 2017-04-24 LAB — PAIN MGMT, PROFILE 8 W/CONF, U
6 Acetylmorphine: NEGATIVE ng/mL (ref <10)
Alcohol Metabolites: NEGATIVE ng/mL (ref <500)
Amphetamines: NEGATIVE ng/mL (ref <500)
BENZODIAZEPINES: NEGATIVE ng/mL (ref <100)
BUPRENORPHINE, URINE: NEGATIVE ng/mL (ref <5)
COCAINE METABOLITE: NEGATIVE ng/mL (ref <150)
Codeine: NEGATIVE ng/mL (ref <50)
Creatinine: 36.8 mg/dL
HYDROCODONE: NEGATIVE ng/mL (ref <50)
Hydromorphone: NEGATIVE ng/mL (ref <50)
MARIJUANA METABOLITE: NEGATIVE ng/mL (ref <20)
MDMA: NEGATIVE ng/mL (ref <500)
Morphine: NEGATIVE ng/mL (ref <50)
Norhydrocodone: 135 ng/mL — ABNORMAL HIGH (ref <50)
Opiates: POSITIVE ng/mL — AB (ref <100)
Oxidant: NEGATIVE ug/mL (ref <200)
Oxycodone: NEGATIVE ng/mL (ref <100)
pH: 7.24 (ref 4.5–9.0)

## 2017-06-03 ENCOUNTER — Other Ambulatory Visit: Payer: Self-pay | Admitting: Family Medicine

## 2017-06-03 DIAGNOSIS — M549 Dorsalgia, unspecified: Secondary | ICD-10-CM

## 2017-06-05 ENCOUNTER — Other Ambulatory Visit: Payer: Self-pay | Admitting: Family Medicine

## 2017-06-05 DIAGNOSIS — E039 Hypothyroidism, unspecified: Secondary | ICD-10-CM

## 2017-06-05 NOTE — Telephone Encounter (Signed)
Last office visit on 04/22/2017 Last refill on 04/22/2017  #70 no refills CSC signed on 04/22/2017 UDS was due on 04/23/2017

## 2017-06-21 ENCOUNTER — Ambulatory Visit: Payer: Managed Care, Other (non HMO) | Admitting: Family Medicine

## 2017-06-24 ENCOUNTER — Other Ambulatory Visit: Payer: Self-pay | Admitting: Family Medicine

## 2017-07-03 ENCOUNTER — Other Ambulatory Visit: Payer: Self-pay | Admitting: Family Medicine

## 2017-07-03 DIAGNOSIS — M549 Dorsalgia, unspecified: Secondary | ICD-10-CM

## 2017-07-03 NOTE — Telephone Encounter (Signed)
Copied from [place] [phone] [phone]. Topic: Quick Communication - Rx Refill/Question >> Jul 03, 2017  8:57 AM Margot Ables wrote: Medication: HYDROcodone-acetaminophen (NORCO) 10-325 MG tablet- pt will be out on 07/07/17 which is Sunday. Can she get RX sent in before the weekend? Has the patient contacted their pharmacy? No - controlled Preferred Pharmacy (with phone number or street name): [place], [place] - [address] [phone] (Phone) [phone] (Fax)

## 2017-07-04 MED ORDER — HYDROCODONE-ACETAMINOPHEN 10-325 MG PO TABS: ORAL_TABLET | ORAL | 0 refills | Status: DC

## 2017-07-04 NOTE — Telephone Encounter (Signed)
Requesting:Norco Contract:yes UDS:mo/derate risk next screen  Last OV/:04/22/17 Next OV:07/25/17 Last Refill:06/05/17 #70-0 Database:   Please advise

## 2017-07-13 ENCOUNTER — Other Ambulatory Visit: Payer: Self-pay | Admitting: Family Medicine

## 2017-07-13 DIAGNOSIS — F988 Other specified behavioral and emotional disorders with onset usually occurring in childhood and adolescence: Secondary | ICD-10-CM

## 2017-07-16 NOTE — Telephone Encounter (Signed)
Requesting: Ritalin 30mg  qday Contract: yes UDS: 04/22/17 Last OV: 04/22/17 Next Ov: 07/25/17 Last refill: 04/22/17, for four different rx, two notated as April, two notated as May  Database: no discrepancies noted  Confusing refill documentation, please advise.

## 2017-07-25 ENCOUNTER — Encounter: Payer: Self-pay | Admitting: Family Medicine

## 2017-07-25 ENCOUNTER — Ambulatory Visit: Payer: Managed Care, Other (non HMO) | Admitting: Family Medicine

## 2017-07-25 VITALS — BP 112/64 | HR 63 | Temp 97.9°F | Resp 18 | Wt 175.8 lb

## 2017-07-25 DIAGNOSIS — M545 Low back pain: Secondary | ICD-10-CM

## 2017-07-25 DIAGNOSIS — N9489 Other specified conditions associated with female genital organs and menstrual cycle: Secondary | ICD-10-CM | POA: Diagnosis not present

## 2017-07-25 DIAGNOSIS — Z79899 Other long term (current) drug therapy: Secondary | ICD-10-CM

## 2017-07-25 DIAGNOSIS — N952 Postmenopausal atrophic vaginitis: Secondary | ICD-10-CM

## 2017-07-25 DIAGNOSIS — I10 Essential (primary) hypertension: Secondary | ICD-10-CM | POA: Diagnosis not present

## 2017-07-25 DIAGNOSIS — F988 Other specified behavioral and emotional disorders with onset usually occurring in childhood and adolescence: Secondary | ICD-10-CM | POA: Diagnosis not present

## 2017-07-25 DIAGNOSIS — F410 Panic disorder [episodic paroxysmal anxiety] without agoraphobia: Secondary | ICD-10-CM | POA: Diagnosis not present

## 2017-07-25 DIAGNOSIS — R5383 Other fatigue: Secondary | ICD-10-CM | POA: Diagnosis not present

## 2017-07-25 LAB — COMPREHENSIVE METABOLIC PANEL
ALK PHOS: 55 U/L (ref 39–117)
ALT: 13 U/L (ref 0–35)
AST: 16 U/L (ref 0–37)
Albumin: 4.2 g/dL (ref 3.5–5.2)
BILIRUBIN TOTAL: 0.4 mg/dL (ref 0.2–1.2)
BUN: 14 mg/dL (ref 6–23)
CO2: 28 mEq/L (ref 19–32)
Calcium: 9.1 mg/dL (ref 8.4–10.5)
Chloride: 107 mEq/L (ref 96–112)
Creatinine, Ser: 1.03 mg/dL (ref 0.40–1.20)
GFR: 58.79 mL/min — ABNORMAL LOW (ref >60.00)
GLUCOSE: 105 mg/dL — AB (ref 70–99)
Potassium: 4.8 mEq/L (ref 3.5–5.1)
Sodium: 141 mEq/L (ref 135–145)
TOTAL PROTEIN: 6.4 g/dL (ref 6.0–8.3)

## 2017-07-25 LAB — CBC
HCT: 35.4 % — ABNORMAL LOW (ref 36.0–46.0)
HEMOGLOBIN: 12.1 g/dL (ref 12.0–15.0)
MCHC: 34.1 g/dL (ref 30.0–36.0)
MCV: 89.5 fl (ref 78.0–100.0)
PLATELETS: 168 10*3/uL (ref 150.0–400.0)
RBC: 3.96 Mil/uL (ref 3.87–5.11)
RDW: 12.9 % (ref 11.5–15.5)
WBC: 3.7 10*3/uL — ABNORMAL LOW (ref 4.0–10.5)

## 2017-07-25 LAB — TSH: TSH: 0.96 u[IU]/mL (ref 0.35–4.50)
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
## 2017-07-25 NOTE — Assessment & Plan Note (Signed)
Well controlled, no changes to meds. Encouraged heart healthy diet such as the DASH diet and exercise as tolerated.  °

## 2017-07-25 NOTE — Assessment & Plan Note (Signed)
Will need to have [place] prescribe her Valium PV in the future

## 2017-07-25 NOTE — Assessment & Plan Note (Signed)
Uses Hydrocodone prn uses it 2-3 x a day.

## 2017-07-25 NOTE — Assessment & Plan Note (Signed)
Using [employer] [facility] tabs
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
## 2017-07-25 NOTE — Assessment & Plan Note (Signed)
Does Ritalin and doing well most days. UDS is updated.

## 2017-07-25 NOTE — Patient Instructions (Signed)

## 2017-07-25 NOTE — Assessment & Plan Note (Signed)
Using premarin cream 3 x a week and has seen Urogynecology at [facility], Dr Zigmund Daniel and they have been having her use Valium 5 mg PV prn to help with the uterine contractions she has at times.

## 2017-07-28 LAB — PAIN MGMT, PROFILE 8 W/CONF, U
6 ACETYLMORPHINE: NEGATIVE ng/mL (ref <10)
AMPHETAMINES: NEGATIVE ng/mL (ref <500)
Alcohol Metabolites: NEGATIVE ng/mL (ref <500)
BUPRENORPHINE, URINE: NEGATIVE ng/mL (ref <5)
Benzodiazepines: NEGATIVE ng/mL (ref <100)
CODEINE: NEGATIVE ng/mL (ref <50)
CREATININE: 38.1 mg/dL
Cocaine Metabolite: NEGATIVE ng/mL (ref <150)
Hydrocodone: 365 ng/mL — ABNORMAL HIGH (ref <50)
Hydromorphone: 69 ng/mL — ABNORMAL HIGH (ref <50)
MDMA: NEGATIVE ng/mL (ref <500)
Marijuana Metabolite: NEGATIVE ng/mL (ref <20)
Morphine: NEGATIVE ng/mL (ref <50)
NORHYDROCODONE: 351 ng/mL — AB (ref <50)
OPIATES: POSITIVE ng/mL — AB (ref <100)
OXIDANT: NEGATIVE ug/mL (ref <200)
OXYCODONE: NEGATIVE ng/mL (ref <100)
PH: 6.19 (ref 4.5–9.0)

## 2017-08-02 ENCOUNTER — Other Ambulatory Visit: Payer: Self-pay | Admitting: Family Medicine

## 2017-08-02 DIAGNOSIS — M549 Dorsalgia, unspecified: Secondary | ICD-10-CM

## 2017-08-05 NOTE — Telephone Encounter (Signed)
Pt is requesting refill on hydrocodone  Last OV: 07/25/2017 Last Fill: 07/04/2017 #70 and 0RF UDS: 07/25/2017 Low risk CSC: 04/22/2017

## 2017-08-27 ENCOUNTER — Other Ambulatory Visit: Payer: Self-pay | Admitting: Family Medicine

## 2017-09-04 ENCOUNTER — Telehealth: Payer: Self-pay | Admitting: Family Medicine

## 2017-09-04 ENCOUNTER — Other Ambulatory Visit: Payer: Self-pay | Admitting: Family Medicine

## 2017-09-04 DIAGNOSIS — M549 Dorsalgia, unspecified: Secondary | ICD-10-CM

## 2017-09-04 MED ORDER — HYDROCODONE-ACETAMINOPHEN 10-325 MG PO TABS: 1.0000 | ORAL_TABLET | Freq: Three times a day (TID) | ORAL | 0 refills | Status: DC | PRN

## 2017-09-04 NOTE — Telephone Encounter (Signed)
Copied from [place] [id number]. Topic: General - Other >> Sep 04, 2017  9:08 AM Lennox Solders wrote: Reason for CRM: pt has a new pharm and they will needs new rx hydrocodone to [facility] in  [place]. [facility] will be closing

## 2017-09-04 NOTE — Telephone Encounter (Signed)
Last hydrocodone RX: 08/05/17, #70 Last OV: 07/25/17 Next OV: 11/29/17 UDS: 07/25/17, moderate (repeat 3 months).  CSC: 04/22/17 CSR: No discrepancies identified   ** Appt note has been made to update CSC with new pharmacy name at [place] in November. **

## 2017-09-04 NOTE — Telephone Encounter (Signed)
Prescription sent to [facility]

## 2017-09-24 ENCOUNTER — Other Ambulatory Visit: Payer: Self-pay | Admitting: Family Medicine

## 2017-09-24 DIAGNOSIS — F988 Other specified behavioral and emotional disorders with onset usually occurring in childhood and adolescence: Secondary | ICD-10-CM

## 2017-09-24 NOTE — Telephone Encounter (Signed)
Copied from [place] [id number]. Topic: Quick Communication - Rx Refill/Question >> Sep 24, 2017  4:42 PM Sheran Luz wrote: Medication: methylphenidate (RITALIN) 10 MG tablet [[id number]] and methylphenidate (RITALIN LA) 30 MG 24 hr capsule [[id number]]     Preferred Pharmacy (with phone number or street name): [facility], [place], [place] - [address] [phone] (Phone) [phone] (Fax)

## 2017-09-24 NOTE — Telephone Encounter (Signed)
Refill of ritalin  LRF 07/16/17  #30  0 refills  LOV 07/25/17 Dr. Consuello [facility]

## 2017-09-25 MED ORDER — METHYLPHENIDATE HCL 10 MG PO TABS: 10.0000 mg | ORAL_TABLET | Freq: Every day | ORAL | 0 refills | Status: DC

## 2017-09-25 MED ORDER — METHYLPHENIDATE HCL ER (LA) 30 MG PO CP24: 30.0000 mg | ORAL_CAPSULE | Freq: Every morning | ORAL | 0 refills | Status: DC

## 2017-09-25 NOTE — Telephone Encounter (Signed)
Last  RX: Ritalin 10mg : # 30, 04/22/17.  Ritalin 30mg : # 30, 07/16/17 Last OV: 07/25/17 Next OV: 12/03/17 UDS: 07/25/17, low risk. Repeat in 6 months CSC: 04/22/17 CSR: No discrepancies identified

## 2017-09-26 MED ORDER — METHYLPHENIDATE HCL 10 MG PO TABS: 10.0000 mg | ORAL_TABLET | Freq: Every day | ORAL | 0 refills | Status: DC

## 2017-09-26 MED ORDER — METHYLPHENIDATE HCL ER (LA) 30 MG PO CP24: 30.0000 mg | ORAL_CAPSULE | Freq: Every morning | ORAL | 0 refills | Status: DC

## 2017-09-26 MED ORDER — METHYLPHENIDATE HCL ER (LA) 30 MG PO CP24: 30.0000 mg | ORAL_CAPSULE | ORAL | 0 refills | Status: DC

## 2017-09-26 NOTE — Telephone Encounter (Signed)
One rx was printed I have shredded it. Please e-prescribe to [facility]  Everything has been set to go   Please advise

## 2017-09-26 NOTE — Addendum Note (Signed)
Addended by: Magdalene Molly A on: 09/26/2017 08:03 AM   Modules accepted: Orders

## 2017-10-09 ENCOUNTER — Other Ambulatory Visit: Payer: Self-pay | Admitting: Family Medicine

## 2017-10-09 DIAGNOSIS — M549 Dorsalgia, unspecified: Secondary | ICD-10-CM

## 2017-10-09 NOTE — Telephone Encounter (Signed)
Pt is requesting refill on hydrocodone.   Last OV: 07/25/2017 Last Fill: 09/04/2017 #70 and 0RF UDS: 07/25/2017 Low risk

## 2017-11-07 ENCOUNTER — Other Ambulatory Visit: Payer: Self-pay | Admitting: Family Medicine

## 2017-11-07 DIAGNOSIS — M549 Dorsalgia, unspecified: Secondary | ICD-10-CM

## 2017-11-08 NOTE — Telephone Encounter (Signed)
Requesting:[place] Contract:yes UDS:low risk next screen 01/25/18 Last OV: 07/25/17 Next OV:11/29/17 Last Refill:10/09/17 #70-0rf Database:   Please advise

## 2017-11-29 ENCOUNTER — Encounter: Payer: Managed Care, Other (non HMO) | Admitting: Family Medicine

## 2017-12-26 ENCOUNTER — Other Ambulatory Visit: Payer: Self-pay | Admitting: Family Medicine

## 2018-06-24 ENCOUNTER — Other Ambulatory Visit: Payer: Self-pay | Admitting: Family Medicine

## 2018-10-13 ENCOUNTER — Other Ambulatory Visit: Payer: Self-pay | Admitting: Family Medicine

## 2018-11-20 ENCOUNTER — Encounter (INDEPENDENT_AMBULATORY_CARE_PROVIDER_SITE_OTHER): Payer: Managed Care, Other (non HMO) | Admitting: Ophthalmology

## 2019-03-27 ENCOUNTER — Ambulatory Visit: Payer: Managed Care, Other (non HMO)

## 2020-12-27 ENCOUNTER — Telehealth: Payer: Self-pay | Admitting: Internal Medicine

## 2020-12-27 ENCOUNTER — Telehealth: Payer: Self-pay

## 2020-12-27 NOTE — Telephone Encounter (Signed)
Patient called back and said that she has a Christmas party scheduled on Thursday and can't do the 2:40 pm appointment. Appointment cancelled and rescheduled visit with Joan Robinson on 12/28/20 at 3:30 pm.

## 2020-12-27 NOTE — Telephone Encounter (Signed)
Called patient and she said that she had a CT scan that showed she had a hernia. She also states she has gallbladder issues as well. She has recently started having nausea and indigestion. She denies having pain.Scheduled patient for an appointment on Thursday 12/29/20 at 2:40 pm with Dr. Henrene Pastor.

## 2020-12-27 NOTE — Telephone Encounter (Signed)
Patient called states she has a hernia and is seeking advise currently having a lot of abdominal discomfort and is nauseated.

## 2020-12-28 ENCOUNTER — Encounter: Payer: Self-pay | Admitting: Physician Assistant

## 2020-12-28 ENCOUNTER — Ambulatory Visit: Payer: Managed Care, Other (non HMO) | Admitting: Physician Assistant

## 2020-12-28 ENCOUNTER — Ambulatory Visit (INDEPENDENT_AMBULATORY_CARE_PROVIDER_SITE_OTHER)
Admission: RE | Admit: 2020-12-28 | Discharge: 2020-12-28 | Disposition: A | Discharge status: Home / Self Care | Payer: Managed Care, Other (non HMO) | Source: Ambulatory Visit | Attending: Physician Assistant | Admitting: Physician Assistant

## 2020-12-28 ENCOUNTER — Other Ambulatory Visit: Payer: Self-pay

## 2020-12-28 VITALS — BP 120/80 | HR 67 | Ht 66.0 in | Wt 198.0 lb

## 2020-12-28 DIAGNOSIS — R110 Nausea: Secondary | ICD-10-CM | POA: Diagnosis not present

## 2020-12-28 DIAGNOSIS — R6881 Early satiety: Secondary | ICD-10-CM

## 2020-12-28 DIAGNOSIS — K449 Diaphragmatic hernia without obstruction or gangrene: Secondary | ICD-10-CM

## 2020-12-28 DIAGNOSIS — K219 Gastro-esophageal reflux disease without esophagitis: Secondary | ICD-10-CM | POA: Diagnosis not present

## 2020-12-28 MED ORDER — SUCRALFATE 1 GM/10ML PO SUSP: 1.0000 g | Freq: Four times a day (QID) | ORAL | 1 refills | Status: DC

## 2020-12-28 NOTE — Patient Instructions (Addendum)
Your provider has requested that you have an chest and abdominal x ray before leaving today. Please go to the basement floor to our Radiology department for the test.  We have sent the following medications to your pharmacy for you to pick up at your convenience: Carafate 10 ml four times daily 1 hour prior or 2 hours after meals.  If you are age 60 or older, your body mass index should be between 23-30. Your Body mass index is 31.96 kg/m. If this is out of the aforementioned range listed, please consider follow up with your Primary Care Provider.  If you are age 31 or younger, your body mass index should be between 19-25. Your Body mass index is 31.96 kg/m. If this is out of the aformentioned range listed, please consider follow up with your Primary Care Provider.   ________________________________________________________  The [place] [facility] providers would like to encourage you to use [facility] to communicate with providers for non-urgent requests or questions.  Due to long hold times on the telephone, sending your provider a message by [facility] may be a faster and more efficient way to get a response.  Please allow 48 business hours for a response.  Please remember that this is for non-urgent requests.  _______________________________________________________

## 2020-12-28 NOTE — Progress Notes (Signed)
Joan Robinson, I have reviewed your note and the x-rays as requested.  Please contact the patient yourself to let her know that I have reviewed things and recommend the following: 1.  In addition to regular x-rays, set her up for upper GI series "large hiatal hernia, rule out obstructive component". 2.  EGD in [place]. 3.  Agree with Carafate for now. Thanks, Dr. Henrene Pastor

## 2020-12-28 NOTE — Progress Notes (Signed)
Chief Complaint: Hernia, indigestion, nausea  HPI:    Joan Robinson is a 60 year old female with a past medical history of chronic back pain on Hydrocodone, osteoarthritis, anxiety and depression, IBS and multiple others, known to Dr. Henrene Pastor, who presents to clinic today with a complaint of hernia, indigestion and nausea.    08/01/2016 office visit with Dr. Henrene Pastor.  At that time sent to clinic from her gynecologist regarding rectal bleeding.  At that time scheduled a colonoscopy.    08/05/2008 EGD with a normal esophagus, small hiatal hernia and nodules in the fundus.    10/19/2016 colonoscopy with one 1 mm polyp in the cecum, diverticulosis in the sigmoid colon and internal hemorrhoids.  Repeat recommended in 5 years given her polyp history.  Pathology showed benign tissue.    03/03/2020 CT of the chest for lung screening showed gallstones, large hiatal hernia which involve the entire stomach and emphysema.    12/27/2020 patient called and described that she had a CT scan that showed a hernia and had "gallbladder issues".  Apparently had recently started having nausea and indigestion and wanted to be seen.    Today, the patient tells me that she at some point mention to Dr. Henrene Pastor that she had a CT which showed a large hiatal hernia.  He had apparently given her some warning signs that if this started to bother her she needed to come in.  She tells me she thinks she has been experiencing those over the past 6 months or so.  Things have just been slowly getting worse.  She describes constantly feeling full and only taking a bite of food and feeling like she cannot eat anymore.  Also with almost constant nausea and occasional vomiting as well as indigestion regardless of taking over-the-counter Pepcid once daily.  Tells me that sometimes she cannot sleep because of the discomfort and just feels like her chest is very full all the time.  Does describe that she has tried various PPIs in the past which never worked  for her.  Apparently being on Carafate years ago did seem to help.    Denies fever, chills, weight loss, blood in her stool or symptoms that awaken her from sleep.  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   BCC (basal cell carcinoma of skin) 06/01/2012   Leg Follows with Dr Nevada Crane   Bipolar disorder ([place]) 01/17/2007   Qualifier: Diagnosis of  By: Loanne Drilling MD, Sean A    Chicken pox as a child   X 2   Colitis, Clostridium difficile 5/11,6/11   Colon polyp    benign   Diverticulosis    Diverticulosis    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   Headache(784.0) 08/17/2012   Hyperlipidemia    Hypertension    Hypothyroidism    Hypothyroidism 08/24/2006   Qualifier: Diagnosis of  By: Loanne Drilling MD, Sean A     IBS (irritable bowel syndrome) 07/27/2016   Lower back pain    Macular pucker, bilateral 10/10/2011   Mumps as a child   Obesity 11/01/2013   Perimenopausal 01/16/2012   Preventative health care 10/10/2011   PUD (peptic ulcer disease)  Right knee pain 05/10/2012   Sinusitis acute 10/10/2011   Sleep apnea 04/27/2016   Staph aureus infection 12/18/2011   Recurrent lesions in nares   Tobacco abuse    Urinary incontinence     Past Surgical History:  Procedure Laterality Date   ABDOMINAL HYSTERECTOMY     ABDOMINAL HYSTERECTOMY  2011   complete   COLONOSCOPY     DILATION AND CURETTAGE OF UTERUS  1985   EYE SURGERY  03/03/14   Surgery on right eye for epiretinal membrane (vitreous peel)    Gated Spect wall motion stress cardiolite  11/05/2001   POLYPECTOMY     TUBAL LIGATION  1995   UTERINE SUSPENSION     mesh   VITRECTOMY Bilateral 03/03/14    Current Outpatient Medications   Medication Sig Dispense Refill   albuterol (PROVENTIL HFA;VENTOLIN HFA) 108 (90 Base) MCG/ACT inhaler Inhale 2 puffs into the lungs every 6 (six) hours as needed for wheezing or shortness of breath. 1 Inhaler 0   ALPRAZolam (XANAX) 0.25 MG tablet TAKE ONE TABLET BY MOUTH 3 TIMES DAILY AS NEEDED FOR SLEEP OR ANXIETY 30 tablet 2   Cholecalciferol (VITAMIN D3) 2000 UNITS TABS Take by mouth.     conjugated estrogens (PREMARIN) vaginal cream Place 1 Applicatorful vaginally every other day. 30 g 5   HYDROcodone-acetaminophen (NORCO) 10-325 MG tablet TAKE ONE TABLET BY MOUTH THREE TIMES DAILY AS NEEDED 70 tablet 0   hydrocortisone (ANUSOL-HC) 25 MG suppository Place 1 suppository (25 mg total) rectally at bedtime. 12 suppository 0   hydrocortisone-pramoxine (ANALPRAM HC) 2.5-1 % rectal cream Place 1 application rectally 2 (two) times daily. For 10 days 30 g 1   latanoprost (XALATAN) 0.005 % ophthalmic solution      levothyroxine (SYNTHROID, LEVOTHROID) 100 MCG tablet Take 1 tablet (100 mcg total) by mouth daily before breakfast. 30 tablet 6   methylphenidate (RITALIN LA) 30 MG 24 hr capsule Take 1 capsule (30 mg total) by mouth every morning. October 2019 30 capsule 0   methylphenidate (RITALIN LA) 30 MG 24 hr capsule Take 1 capsule (30 mg total) by mouth every morning. September 2019 30 capsule 0   methylphenidate (RITALIN) 10 MG tablet Take 1 tablet (10 mg total) by mouth daily. September 2019 30 tablet 0   methylphenidate (RITALIN) 10 MG tablet Take 1 tablet (10 mg total) by mouth daily. October 2019 30 tablet 0   metoprolol tartrate (LOPRESSOR) 25 MG tablet TAKE 1/2 TABLET BY MOUTH DAILY 45 tablet 1   Misc Natural Products (ADRENAL PO) Take by mouth.     nitroGLYCERIN (NITROSTAT) 0.4 MG SL tablet Place 1 tablet (0.4 mg total) under the tongue every 5 (five) minutes as needed for chest pain (max of 3 tabs, to ER if pain persists). 25 tablet 0   ondansetron (ZOFRAN) 4 MG tablet TAKE ONE TABLET BY MOUTH  EVERY 4 HOURS AS NEEDED FOR NAUSEA OR VOMITING 30 tablet 3   phenazopyridine (PYRIDIUM) 200 MG tablet Take 1 tablet (200 mg total) by mouth 3 (three) times daily as needed for pain. 10 tablet 0   ranitidine (ZANTAC) 150 MG tablet Take 1 tablet (150 mg total) by mouth 2 (two) times daily. 180 tablet 3   sucralfate (CARAFATE) 1 g tablet TAKE ONE TABLET BY MOUTH FOUR (4) TIMES DAILY WITH MEALS AND AT BEDTIME prn 120 tablet 0   SYNTHROID 100 MCG tablet TAKE ONE TABLET BY MOUTH EVERY DAY BEFORE BREAKFAST 30 tablet 5   vitamin A  8000 UNIT capsule Take 8,000 Units by mouth daily.     zinc gluconate 50 MG tablet Take 1 tablet (50 mg total) by mouth daily. 30 tablet 2   No current facility-administered medications for this visit.    Allergies as of 12/28/2020 - Review Complete 07/25/2017  Allergen Reaction Noted   Keflex [cephalexin] Diarrhea 10/10/2011   Aspirin  02/04/2009   Butamben-tetracaine-benzocaine  08/11/2010   Dicyclomine hcl  06/24/2009   Metronidazole  02/04/2009   Moxifloxacin  02/04/2009   Nsaids  02/04/2009   Phenergan [promethazine hcl]  10/10/2011   Quetiapine  11/26/2012   Sulfamethoxazole-trimethoprim  02/04/2009   Cymbalta [duloxetine hcl] Rash 03/16/2014   Levothyroxine Palpitations 10/18/2011   Moxifloxacin hcl in nacl Palpitations 08/11/2010   Orphenadrine citrate Palpitations 10/18/2011    Family History  Problem Relation Age of Onset   Heart disease Mother        stents   Stroke Mother    Hyperlipidemia Mother    Hypertension Mother    Other Mother        blood disorder- mgus   Colon polyps Father    Other Father        aorta disection   Aneurysm Father    Irritable bowel syndrome Daughter    Other Daughter        gastritis   Mental illness Daughter        bipolar and mood disorder   Hypertension Sister        ?   Mental illness Son        bipolar   Arthritis Sister    Other Sister        thyroid   Other Sister        thyroid   Colon cancer  Paternal Uncle     Social History   Socioeconomic History   Marital status: Married    Spouse name: Not on file   Number of children: 2   Years of education: Not on file   Highest education level: Not on file  Occupational History   Occupation: Accounts Receivable  Tobacco Use   Smoking status: Former    Packs/day: 1.00    Years: 30.00    Pack years: 30.00    Types: Cigarettes    Quit date: 05/15/2009    Years since quitting: 11.6   Smokeless tobacco: Never  Substance and Sexual Activity   Alcohol use: No   Drug use: No   Sexual activity: Never  Other Topics Concern   Not on file  Social History Narrative   Not on file   Social Determinants of Health   Financial Resource Strain: Not on file  Food Insecurity: Not on file  Transportation Needs: Not on file  Physical Activity: Not on file  Stress: Not on file  Social Connections: Not on file  Intimate Partner Violence: Not on file    Review of Systems:    Constitutional: No weight loss, fever or chills Skin: No rash Cardiovascular: No chest pain  Respiratory: No SOB  Gastrointestinal: See HPI and otherwise negative Genitourinary: No dysuria Neurological: No headache, dizziness or syncope Musculoskeletal: No new muscle or joint pain Hematologic: No bleeding  Psychiatric: No history of depression or anxiety   Physical Exam:  Vital signs: BP 120/80    Pulse 67    Ht 5\' 6"  (1.676 m)    Wt 198 lb (89.8 kg)    BMI 31.96 kg/m    Constitutional:   Pleasant overweight  Caucasian female appears to be in NAD, Well developed, Well nourished, alert and cooperative Head:  Normocephalic and atraumatic. Eyes:   PEERL, EOMI. No icterus. Conjunctiva pink. Ears:  Normal auditory acuity. Neck:  Supple Throat: Oral cavity and pharynx without inflammation, swelling or lesion.  Respiratory: Respirations even and unlabored. Lungs clear to auscultation bilaterally.   No wheezes, crackles, or rhonchi.  Cardiovascular: Normal S1, S2.  No MRG. Regular rate and rhythm. No peripheral edema, cyanosis or pallor.  Gastrointestinal:  Soft, nondistended, nontender. No rebound or guarding. Normal bowel sounds. No appreciable masses or hepatomegaly. Rectal:  Not performed.  Msk:  Symmetrical without gross deformities. Without edema, no deformity or joint abnormality.  Neurologic:  Alert and  oriented x4;  grossly normal neurologically.  Skin:   Dry and intact without significant lesions or rashes. Psychiatric: Demonstrates good judgement and reason without abnormal affect or behaviors.  See HPI for recent labs/imaging.  Assessment: 1.  Large hiatal hernia: Seen in February on CT, last EGD in 2010 with small hiatal hernia, now patient with symptoms of early satiety and reflux as well as a fullness sensation 2.  Early satiety: With above 3.  GERD: Increase in symptoms over the past 6 months or so, regardless of taking over-the-counter Pepcid, apparently tried various PPIs in the past to no avail, large hiatal hernia likely aiding to increase in uncontrollable reflux  Plan: 1.  Discussed with patient that it is likely that her large hiatal hernia is given her symptoms that she is experiencing now.  Would recommend that we do a chest x-ray and abdominal x-ray today to get a better look at where her hernia is now. 2.  Patient has not had an EGD since 2010.  She would likely require one before any type of intervention for her hiatal hernia, but will discuss further with Dr. Henrene Pastor. 3.  For now start patient on Carafate 1 g 4 times daily, an hour before or 2 hours after other medicines, start her on the liquid as the pills have been out of stock recently. 4.  Explained to patient that after time of x-ray we can decide what next steps are for her.  She does tell me she would really appreciate Dr. Blanch Media opinion as she has been seeing him and her family has seen him for years.  Explained that he would thoroughly review her chart after she is seen  today.  Joan Newer, PA-C [facility] 12/28/2020, 3:22 PM  Cc: Mosie Lukes, MD

## 2020-12-29 ENCOUNTER — Telehealth: Payer: Self-pay | Admitting: Physician Assistant

## 2020-12-29 ENCOUNTER — Ambulatory Visit: Payer: Managed Care, Other (non HMO) | Admitting: Internal Medicine

## 2020-12-29 DIAGNOSIS — R6881 Early satiety: Secondary | ICD-10-CM

## 2020-12-29 DIAGNOSIS — K219 Gastro-esophageal reflux disease without esophagitis: Secondary | ICD-10-CM

## 2020-12-29 DIAGNOSIS — K449 Diaphragmatic hernia without obstruction or gangrene: Secondary | ICD-10-CM

## 2020-12-29 DIAGNOSIS — R110 Nausea: Secondary | ICD-10-CM

## 2020-12-29 NOTE — Telephone Encounter (Signed)
Called and spoke with patient to set up EGD appt and discuss upper GI series appt. Pt has been scheduled for an EGD in [facility] with Dr. Henrene Pastor on Monday, 01/02/21 at 4 pm. Pt is aware that she will need to arrive on the 4th floor by 3 pm with a care partner. Pt knows to expect a call from radiology scheduling to set up her upper gi series appt. Pt reports that she does have access to her my chart and she is aware that I will send her instructions to my chart. Pt verbalized understanding and had no concerns at the end of the call.   Upper GI series order in epic. Secure staff message sent to radiology scheduling to set up appt.   Ambulatory referral to GI in epic.

## 2020-12-29 NOTE — Addendum Note (Signed)
Addended by: Yevette Edwards on: 12/29/2020 10:18 AM   Modules accepted: Orders

## 2020-12-29 NOTE — Telephone Encounter (Signed)
Telephone Call  12/29/20 0840  Called and spoke with the patient per recommendations from Dr. Henrene Pastor.  Explained that we want her set her up for an upper GI series as well as an EGD.  Patient tells me that she has had trouble picking up the Carafate suspension.  Apparently they did not have it at the pharmacy yesterday but are going to call her by this afternoon to let her know if they get it in.  If not we could send it to another place for her.  Told her to call back and let us know.  Ellouise Newer, PA-C

## 2021-01-02 ENCOUNTER — Ambulatory Visit (AMBULATORY_SURGERY_CENTER): Payer: Managed Care, Other (non HMO) | Admitting: Internal Medicine

## 2021-01-02 ENCOUNTER — Encounter: Payer: Self-pay | Admitting: Internal Medicine

## 2021-01-02 VITALS — BP 110/72 | HR 77 | Temp 97.8°F | Resp 16 | Ht 66.0 in | Wt 198.0 lb

## 2021-01-02 DIAGNOSIS — K219 Gastro-esophageal reflux disease without esophagitis: Secondary | ICD-10-CM | POA: Diagnosis not present

## 2021-01-02 DIAGNOSIS — R110 Nausea: Secondary | ICD-10-CM

## 2021-01-02 DIAGNOSIS — K449 Diaphragmatic hernia without obstruction or gangrene: Secondary | ICD-10-CM

## 2021-01-02 DIAGNOSIS — R109 Unspecified abdominal pain: Secondary | ICD-10-CM | POA: Diagnosis not present

## 2021-01-02 DIAGNOSIS — K317 Polyp of stomach and duodenum: Secondary | ICD-10-CM | POA: Diagnosis not present

## 2021-01-02 DIAGNOSIS — R6881 Early satiety: Secondary | ICD-10-CM

## 2021-01-02 MED ORDER — SODIUM CHLORIDE 0.9 % IV SOLN: 500.0000 mL | Freq: Once | INTRAVENOUS | Status: DC

## 2021-01-02 MED ORDER — PANTOPRAZOLE SODIUM 40 MG PO TBEC: 40.0000 mg | DELAYED_RELEASE_TABLET | Freq: Two times a day (BID) | ORAL | 6 refills | Status: DC

## 2021-01-02 NOTE — Op Note (Signed)
[facility] Patient Name: Joan Robinson Procedure Date: 01/02/2021 4:21 PM MRN: [id number] Endoscopist: Wilhemina Bonito. Marina Goodell , MD Age: 59 Referring MD:  Date of Birth: 07/05/1960 Gender: Female Account #: [id number] Procedure:                Upper GI endoscopy Indications:              Abdominal pain, Nausea with vomiting, large hiatal                            hernia imaging, refractory reflux symptoms, chronic                            cough Medicines:                Monitored Anesthesia Care Procedure:                Pre-Anesthesia Assessment:                           - Prior to the procedure, a History and Physical                            was performed, and patient medications and                            allergies were reviewed. The patient's tolerance of                            previous anesthesia was also reviewed. The risks                            and benefits of the procedure and the sedation                            options and risks were discussed with the patient.                            All questions were answered, and informed consent                            was obtained. Prior Anticoagulants: The patient has                            taken no previous anticoagulant or antiplatelet                            agents. ASA Grade Assessment: II - A patient with                            mild systemic disease. After reviewing the risks                            and benefits, the patient was deemed in  satisfactory condition to undergo the procedure.                           After obtaining informed consent, the endoscope was                            passed under direct vision. Throughout the                            procedure, the patient's blood pressure, pulse, and                            oxygen saturations were monitored continuously. The                            Olympus [id number]-[id number] #[id number] was introduced  through                            the mouth, and advanced to the second part of                            duodenum. The upper GI endoscopy was accomplished                            without difficulty. The patient tolerated the                            procedure well. Scope In: Scope Out: Findings:                 The esophagus was normal.                           The stomach revealed a large hiatal hernia with                            complicating features including probable                            paraesophageal component. There were benign                            inflammatory type polyps in the gastric body.                           The examined duodenum was normal, though appeared                            compressed.                           The cardia and gastric fundus were visualized on                            retroflexion. Complications:            No immediate complications. Estimated  Blood Loss:     Estimated blood loss: none. Impression:               1. Complex hiatal hernia. Recommendation:           1. Continue current medications                           2. Prescribe pantoprazole 40 mg twice daily; #60; 6                            refills                           3. Keep plans for upper GI series on January 10, 2021 as scheduled.                           4. Office follow-up with Dr. Marina Goodell in 4 to 5 weeks Wilhemina Bonito. Marina Goodell, MD 01/02/2021 4:47:25 PM This report has been signed electronically.

## 2021-01-02 NOTE — Patient Instructions (Signed)
Pick up new Rx for Protonix.  Keep appointment for upper GI series on January 10, 2021.  Office visit with Dr Henrene Pastor in 4-5 weeks, his office nurse will call you to arrange.  YOU HAD AN ENDOSCOPIC PROCEDURE TODAY AT [facility]:   Refer to the procedure report that was given to you for any specific questions about what was found during the examination.  If the procedure report does not answer your questions, please call your gastroenterologist to clarify.  If you requested that your care partner not be given the details of your procedure findings, then the procedure report has been included in a sealed envelope for you to review at your convenience later.  YOU SHOULD EXPECT: Some feelings of bloating in the abdomen. Passage of more gas than usual.  Walking can help get rid of the air that was put into your GI tract during the procedure and reduce the bloating. If you had a lower endoscopy (such as a colonoscopy or flexible sigmoidoscopy) you may notice spotting of blood in your stool or on the toilet paper. If you underwent a bowel prep for your procedure, you may not have a normal bowel movement for a few days.  Please Note:  You might notice some irritation and congestion in your nose or some drainage.  This is from the oxygen used during your procedure.  There is no need for concern and it should clear up in a day or so.  SYMPTOMS TO REPORT IMMEDIATELY:  Following upper endoscopy (EGD)  Vomiting of blood or coffee ground material  New chest pain or pain under the shoulder blades  Painful or persistently difficult swallowing  New shortness of breath  Fever of 100F or higher  Black, tarry-looking stools  For urgent or emergent issues, a gastroenterologist can be reached at any hour by calling [phone]. Do not use MyChart messaging for urgent concerns.    DIET:  We do recommend a small meal at first, but then you may proceed to your regular diet.  Drink plenty of  fluids but you should avoid alcoholic beverages for 24 hours.  ACTIVITY:  You should plan to take it easy for the rest of today and you should NOT DRIVE or use heavy machinery until tomorrow (because of the sedation medicines used during the test).    FOLLOW UP: Our staff will call the number listed on your records 48-72 hours following your procedure to check on you and address any questions or concerns that you may have regarding the information given to you following your procedure. If we do not reach you, we will leave a message.  We will attempt to reach you two times.  During this call, we will ask if you have developed any symptoms of COVID 19. If you develop any symptoms (ie: fever, flu-like symptoms, shortness of breath, cough etc.) before then, please call [phone].  If you test positive for Covid 19 in the 2 weeks post procedure, please call and report this information to [place].     SIGNATURES/CONFIDENTIALITY: You and/or your care partner have signed paperwork which will be entered into your electronic medical record.  These signatures attest to the fact that that the information above on your After Visit Summary has been reviewed and is understood.  Full responsibility of the confidentiality of this discharge information lies with you and/or your care-partner.

## 2021-01-02 NOTE — Progress Notes (Signed)
Pt's states no medical or surgical changes since previsit or office visit. 

## 2021-01-02 NOTE — Progress Notes (Signed)
Pt in recovery with monitors in place, VSS. Report given to receiving RN. Bite guard was placed with pt awake to ensure comfort. No dental or soft tissue damage noted. 

## 2021-01-03 ENCOUNTER — Other Ambulatory Visit: Payer: Self-pay | Admitting: Physician Assistant

## 2021-01-04 ENCOUNTER — Telehealth: Payer: Self-pay | Admitting: *Deleted

## 2021-01-04 NOTE — Telephone Encounter (Signed)
°  Follow up Call-  Call back number 01/02/2021  Post procedure Call Back phone  # [phone]  Permission to leave phone message Yes  Some recent data might be hidden     Patient questions:  Do you have a fever, pain , or abdominal swelling? No. Pain Score  0 *  Have you tolerated food without any problems? Yes.    Have you been able to return to your normal activities? Yes.    Do you have any questions about your discharge instructions: Diet   No. Medications  No. Follow up visit  No.  Do you have questions or concerns about your Care? No.  Actions: * If pain score is 4 or above: No action needed, pain <4.  Have you developed a fever since your procedure? no  2.   Have you had an respiratory symptoms (SOB or cough) since your procedure? no  3.   Have you tested positive for COVID 19 since your procedure no  4.   Have you had any family members/close contacts diagnosed with the COVID 19 since your procedure?  no   If yes to any of these questions please route to Joylene John, RN and Joella Prince, RN

## 2021-01-10 ENCOUNTER — Other Ambulatory Visit: Payer: Self-pay

## 2021-01-10 ENCOUNTER — Ambulatory Visit (HOSPITAL_COMMUNITY)
Admission: RE | Admit: 2021-01-10 | Discharge: 2021-01-10 | Disposition: A | Discharge status: Home / Self Care | Payer: Managed Care, Other (non HMO) | Source: Ambulatory Visit | Attending: Internal Medicine | Admitting: Internal Medicine

## 2021-01-10 DIAGNOSIS — K219 Gastro-esophageal reflux disease without esophagitis: Secondary | ICD-10-CM | POA: Insufficient documentation

## 2021-01-10 DIAGNOSIS — R6881 Early satiety: Secondary | ICD-10-CM | POA: Diagnosis present

## 2021-01-10 DIAGNOSIS — R110 Nausea: Secondary | ICD-10-CM | POA: Diagnosis present

## 2021-01-10 DIAGNOSIS — K449 Diaphragmatic hernia without obstruction or gangrene: Secondary | ICD-10-CM | POA: Diagnosis present

## 2021-01-23 ENCOUNTER — Other Ambulatory Visit: Payer: Self-pay | Admitting: Physician Assistant

## 2021-01-26 ENCOUNTER — Telehealth: Payer: Self-pay | Admitting: Physician Assistant

## 2021-01-26 NOTE — Telephone Encounter (Signed)
Patient called  and stated that she's having a few issues at the moment and would not disclose what they were. Seeking advice, please advise.

## 2021-01-26 NOTE — Telephone Encounter (Signed)
Returned call to patient. She states that she has been having some abdominal cramping after bowel movements. She describes as a crampy, knotty feeling under her breast all of the way down the middle of her abdomen. Pt states that she noticed that she has some pinkish blood when wiping. She denies any constipation, straining , or excessive diarrhea. She states that she does have a hx of hemorrhoids. Pt wanted to know what she needed to look for in case of an emergency. I told pt that she will need ED evaluation if she develops SOB ,dizziness, fatigue, passing blood independent of stool, fever, severe abdominal pain, not being able to keep any solids or liquids down. I told pt that she can try IB Donald Prose OTC until her appointment. She has not tried any new medications or changed her diet recently. She states that she will see the surgeon the same day she see's Dr. Henrene Pastor. Otherwise, pt has continued Protonix, Pepcid and Carafate as prescribed. Pt knows that we also have doctors on call over the weekend if she is severely concerned. Pt verbalized understanding and had no concerns at the end of the call.

## 2021-01-28 ENCOUNTER — Other Ambulatory Visit: Payer: Self-pay | Admitting: Physician Assistant

## 2021-02-02 ENCOUNTER — Ambulatory Visit: Payer: Managed Care, Other (non HMO) | Admitting: Internal Medicine

## 2021-02-08 ENCOUNTER — Encounter: Payer: Self-pay | Admitting: Internal Medicine

## 2021-02-08 ENCOUNTER — Ambulatory Visit: Payer: Managed Care, Other (non HMO) | Admitting: Internal Medicine

## 2021-02-08 VITALS — BP 110/82 | HR 69 | Ht 66.0 in | Wt 197.4 lb

## 2021-02-08 DIAGNOSIS — K625 Hemorrhage of anus and rectum: Secondary | ICD-10-CM

## 2021-02-08 DIAGNOSIS — R110 Nausea: Secondary | ICD-10-CM | POA: Diagnosis not present

## 2021-02-08 DIAGNOSIS — Z86010 Personal history of colonic polyps: Secondary | ICD-10-CM

## 2021-02-08 DIAGNOSIS — K449 Diaphragmatic hernia without obstruction or gangrene: Secondary | ICD-10-CM

## 2021-02-08 DIAGNOSIS — K219 Gastro-esophageal reflux disease without esophagitis: Secondary | ICD-10-CM

## 2021-02-08 MED ORDER — SUTAB 1479-225-188 MG PO TABS: 1.0000 | ORAL_TABLET | Freq: Once | ORAL | 0 refills | Status: AC

## 2021-02-08 MED ORDER — ONDANSETRON HCL 4 MG PO TABS: ORAL_TABLET | ORAL | 2 refills | Status: DC

## 2021-02-08 NOTE — Progress Notes (Signed)
HISTORY OF PRESENT ILLNESS:  Joan Robinson is a 61 y.o. female with multiple medical problems as listed below who has been followed in this office for GERD, history of peptic ulcer disease, history of C. difficile diarrhea, history of multiple and advanced adenomatous colon polyps, functional GI complaints, and more recently evaluations regarding a large hiatal hernia-felt to be symptomatic.  She also has chronic back pain for which she takes hydrocodone.  She was evaluated by the GI physician assistant in this office December 28, 2020 regarding nausea, refractory reflux, and large hiatal hernia identified on chest CT.  She was also noted to have gallstones.  She has noticed progressive early satiety over time.  Constant nausea with occasional vomiting.  Sometimes difficulty sleeping due to discomfort and a fullness sensation in the chest.  On January 02, 2021 she underwent upper endoscopy.  She was found to have a large hiatal hernia with complicating features including a probable paraesophageal component.  The duodenum was somewhat compressed.  She was prescribed pantoprazole twice daily and Carafate added.  She was set up for upper GI series which was completed January 10, 2021.  The impression was as follows:  IMPRESSION: Complex hiatal hernia as described. The GE junction is just below the LEFT hemidiaphragm at the same level as the antropyloric stomach. The fundus shows intermittent herniation through the esophageal hiatus and the remainder of the stomach is herniated into the chest and inverted. The "fundus" that is seen on RF series 24 could be also exaggerated by the presence of intermittent dilation of a large gastric diverticulum.   No frank obstruction though with stasis in the stomach due to organ position. This is also associated with abundant gastroesophageal reflux.   Duodenal diverticula.   Given complexity of the above findings would suggest comparison with prior CT imaging  if available to determine whether this is worsened and to better delineate the anatomy seen on today's exam. By report the patient has imaging from [facility]. If this is made available an addendum could be provided.     Electronically Signed   By: Zetta Bills M.D.   On: 01/10/2021 11:08  I have referred her to Dr. Johnathan Hausen for a surgical opinion regarding hernia repair.  She has an appointment later today.  She states that her reflux symptoms persist, though may be slightly better with higher dose and multiple medical therapies.  She continues with nausea and requests Zofran.  No vomiting since her last visit.  She does describe decreased taste though normal smell.  She also reports 2 episodes of rectal bleeding with blood admixed with the stool.  She states this is different than previous episodes of bleeding that have been attributed to hemorrhoids.  She does have a history of multiple and large adenomatous colon polyps with previous examinations 2003, 2006, 2014, and most recently 2018.  She is also known to have sigmoid diverticulosis.  She is due for surveillance colonoscopy this year.    REVIEW OF SYSTEMS:  All non-GI ROS negative unless otherwise stated in the HPI except for sinus and allergy, anxiety, arthritis, back pain, cough, fatigue, headaches, heart murmur, itching, muscle cramps, night sweats, nosebleeds, skin rash, sleeping problems, sore throat, ankle swelling, swollen lymph glands, urinary leakage, voice change, shortness of breath  Past Medical History:  Diagnosis Date   Adenomatous colon polyp    Allergic rhinitis    Anemia 10/10/2011   Anxiety and depression 01/17/2007   Qualifier: Diagnosis of  By:  Loanne Drilling MD, Hilliard Clark A    Arthritis 07/24/2013   Likely inflammatory and following with Dr Lynnda Shields of [facility]  rheumatology   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   BCC (basal cell carcinoma of skin) 06/01/2012   Leg Follows with Dr [place] Crane   Bipolar disorder ([place]) 01/17/2007    Qualifier: Diagnosis of  By: Loanne Drilling MD, Sean A    Chicken pox as a child   X 2   Colitis, Clostridium difficile 5/11,6/11   Colon polyp    benign   Diverticulosis    Diverticulosis    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   Headache(784.0) 08/17/2012   Hyperlipidemia    Hypertension    Hypothyroidism    Hypothyroidism 08/24/2006   Qualifier: Diagnosis of  By: Loanne Drilling MD, Sean A     IBS (irritable bowel syndrome) 07/27/2016   Lower back pain    Macular pucker, bilateral 10/10/2011   Mumps as a child   Obesity 11/01/2013   Perimenopausal 01/16/2012   Preventative health care 10/10/2011   PUD (peptic ulcer disease)    Right knee pain 05/10/2012   Sinusitis acute 10/10/2011   Sleep apnea 04/27/2016   Staph aureus infection 12/18/2011   Recurrent lesions in nares   Tobacco abuse    Urinary incontinence     Past Surgical History:  Procedure Laterality Date   ABDOMINAL HYSTERECTOMY     ABDOMINAL HYSTERECTOMY  2011   complete   COLONOSCOPY     [place]   EYE SURGERY  03/03/14   Surgery on right eye for epiretinal membrane (vitreous peel)    Gated Spect wall motion stress cardiolite  11/05/2001   POLYPECTOMY     TUBAL LIGATION  1995   UTERINE SUSPENSION     mesh   VITRECTOMY Bilateral 03/03/14    Social History Joan Robinson  reports that she quit smoking about 11 years ago. Her smoking use included cigarettes. She has a 30.00 pack-year smoking history. She has never used smokeless tobacco. She reports that she does not drink alcohol and does not use drugs.  family history includes Aneurysm in her father; Arthritis in her sister; Colon cancer in her paternal uncle; Colon polyps in her father; Heart disease in her mother; Hyperlipidemia in her mother; Hypertension in her mother and sister; Irritable bowel syndrome in her daughter; Mental illness in her daughter and son; Other in her daughter, father, mother, sister, and  sister; Stroke in her mother.  Allergies  Allergen Reactions   Keflex [Cephalexin] Diarrhea   Aspirin     REACTION: difficulty breathing   Butamben-Tetracaine-Benzocaine     Mouth sores   Dicyclomine Hcl     REACTION: mouth ulcers   Metronidazole     REACTION: hives, mouth ulcers   Moxifloxacin     REACTION: increased heart rate, nausea   Nsaids     REACTION: difficulty breathing   Phenergan [Promethazine Hcl]     "knocks" pt out for about 3 days   Quetiapine     Somnolence. Slept for 36 hours straight.   Sulfamethoxazole-Trimethoprim     REACTION: increased heart rate, nausea   Cymbalta [Duloxetine Hcl] Rash   Levothyroxine Palpitations   Moxifloxacin Hcl In Nacl Palpitations   Orphenadrine Citrate Palpitations       PHYSICAL EXAMINATION: Vital signs: BP 110/82    Pulse 69    Ht 5\' 6"  (1.676 m)    Wt 197 lb 6 oz (89.5  kg)    BMI 31.86 kg/m   Constitutional: generally well-appearing, no acute distress Psychiatric: alert and oriented x3, cooperative, flat affect Eyes: extraocular movements intact, anicteric, conjunctiva pink Mouth: oral pharynx moist, no lesions Neck: supple no lymphadenopathy Cardiovascular: heart regular rate and rhythm.   Lungs: clear to auscultation bilaterally Abdomen: soft, nontender, nondistended, no obvious ascites, no peritoneal signs, normal bowel sounds, no organomegaly Rectal: Deferred until colonoscopy Extremities: no clubbing or cyanosis.  Trace lower extremity edema bilaterally Skin: no lesions on visible extremities Neuro: No focal deficits.  Cranial nerves intact  ASSESSMENT:  1.  GERD.  Ongoing symptoms despite high-dose PPI, Carafate, H2 receptor antagonist therapy.  Almost certainly due to the presence of large complicated hiatal hernia.  See below. 2.  Large complex hiatal hernia as described.  This is responsible for her refractory reflux.  I also believe that she has intermittent outlet obstructive symptoms with early satiety  and vomiting.  She is seeing Dr. Hassell Done later this afternoon for a surgical opinion regarding possible hernia repair. 3.  Nausea 4.  History of multiple advanced adenomatous colon polyps.  Due for surveillance this year 5.  Rectal bleeding as described.  Etiology unclear 6.  Multiple medical problems   PLAN:  1.  Reflux precautions 2.  Continue GI medical therapies 3.  Keep appointment with Dr. Hassell Done, general surgery, this afternoon 4.  Prescribe Zofran 4 mg; #30; 2 refills.  Take 1 or 2 every 4-6 hours as needed for nausea 5.  Colonoscopy.  This to evaluate rectal bleeding of uncertain etiology in a patient with a history of multiple advanced adenomatous colon polyps.The nature of the procedure, as well as the risks, benefits, and alternatives were carefully and thoroughly reviewed with the patient. Ample time for discussion and questions allowed. The patient understood, was satisfied, and agreed to proceed.   A total time of 30 minutes was spent preparing to see the patient, reviewing tests, x-rays and endoscopy reports.  Obtaining comprehensive history, performing medically appropriate physical examination, counseling the patient regarding the above listed issues, ordering medications and endoscopic procedures, and documenting clinical information in the health record

## 2021-02-08 NOTE — Patient Instructions (Signed)
If you are age 61 or older, your body mass index should be between 23-30. Your Body mass index is 31.86 kg/m. If this is out of the aforementioned range listed, please consider follow up with your Primary Care Provider.  If you are age 11 or younger, your body mass index should be between 19-25. Your Body mass index is 31.86 kg/m. If this is out of the aformentioned range listed, please consider follow up with your Primary Care Provider.   ________________________________________________________  The [place] [facility] providers would like to encourage you to use [facility] to communicate with providers for non-urgent requests or questions.  Due to long hold times on the telephone, sending your provider a message by [facility] may be a faster and more efficient way to get a response.  Please allow 48 business hours for a response.  Please remember that this is for non-urgent requests.  _______________________________________________________  We have sent the following medications to your pharmacy for you to pick up at your convenience:  Zofran  You have been scheduled for a colonoscopy. Please follow written instructions given to you at your visit today.  Please pick up your prep supplies at the pharmacy within the next 1-3 days. If you use inhalers (even only as needed), please bring them with you on the day of your procedure.

## 2021-02-10 ENCOUNTER — Encounter: Payer: Self-pay | Admitting: Internal Medicine

## 2021-02-13 ENCOUNTER — Other Ambulatory Visit: Payer: Self-pay

## 2021-02-13 DIAGNOSIS — R110 Nausea: Secondary | ICD-10-CM

## 2021-02-13 DIAGNOSIS — K449 Diaphragmatic hernia without obstruction or gangrene: Secondary | ICD-10-CM

## 2021-02-13 DIAGNOSIS — R6881 Early satiety: Secondary | ICD-10-CM

## 2021-02-13 DIAGNOSIS — K219 Gastro-esophageal reflux disease without esophagitis: Secondary | ICD-10-CM

## 2021-02-13 MED ORDER — PANTOPRAZOLE SODIUM 40 MG PO TBEC: 40.0000 mg | DELAYED_RELEASE_TABLET | Freq: Two times a day (BID) | ORAL | 3 refills | Status: DC

## 2021-02-16 ENCOUNTER — Telehealth: Payer: Self-pay

## 2021-02-16 DIAGNOSIS — K449 Diaphragmatic hernia without obstruction or gangrene: Secondary | ICD-10-CM

## 2021-02-16 DIAGNOSIS — K219 Gastro-esophageal reflux disease without esophagitis: Secondary | ICD-10-CM

## 2021-02-16 DIAGNOSIS — R110 Nausea: Secondary | ICD-10-CM

## 2021-02-16 DIAGNOSIS — R6881 Early satiety: Secondary | ICD-10-CM

## 2021-02-16 NOTE — Telephone Encounter (Signed)
Pantoprazole approved and resent

## 2021-02-17 ENCOUNTER — Ambulatory Visit (AMBULATORY_SURGERY_CENTER): Payer: Managed Care, Other (non HMO) | Admitting: Internal Medicine

## 2021-02-17 ENCOUNTER — Encounter: Payer: Self-pay | Admitting: Internal Medicine

## 2021-02-17 VITALS — BP 126/79 | HR 67 | Temp 95.7°F | Resp 12 | Ht 66.0 in | Wt 197.0 lb

## 2021-02-17 DIAGNOSIS — Z09 Encounter for follow-up examination after completed treatment for conditions other than malignant neoplasm: Secondary | ICD-10-CM | POA: Diagnosis not present

## 2021-02-17 DIAGNOSIS — K625 Hemorrhage of anus and rectum: Secondary | ICD-10-CM

## 2021-02-17 DIAGNOSIS — Z86010 Personal history of colonic polyps: Secondary | ICD-10-CM | POA: Diagnosis not present

## 2021-02-17 MED ORDER — SODIUM CHLORIDE 0.9 % IV SOLN: 500.0000 mL | Freq: Once | INTRAVENOUS | Status: DC

## 2021-02-17 NOTE — Op Note (Signed)
[facility] Patient Name: Joan Robinson Procedure Date: 02/17/2021 9:58 AM MRN: [id number] Endoscopist: Wilhemina Bonito. Marina Goodell , MD Age: 61 Referring MD:  Date of Birth: 1960-06-14 Gender: Female Account #: [id number] Procedure:                Colonoscopy Indications:              High risk colon cancer surveillance: Personal                            history of adenoma (10 mm or greater in size), High                            risk colon cancer surveillance: Personal history of                            multiple (3 or more) adenomas. Previous                            examinations 2003, 2006, 2014, 2018 Medicines:                Monitored Anesthesia Care Procedure:                Pre-Anesthesia Assessment:                           - Prior to the procedure, a History and Physical                            was performed, and patient medications and                            allergies were reviewed. The patient's tolerance of                            previous anesthesia was also reviewed. The risks                            and benefits of the procedure and the sedation                            options and risks were discussed with the patient.                            All questions were answered, and informed consent                            was obtained. Prior Anticoagulants: The patient has                            taken no previous anticoagulant or antiplatelet                            agents. ASA Grade Assessment: II - A patient with  mild systemic disease. After reviewing the risks                            and benefits, the patient was deemed in                            satisfactory condition to undergo the procedure.                           After obtaining informed consent, the colonoscope                            was passed under direct vision. Throughout the                            procedure, the patient's blood pressure,  pulse, and                            oxygen saturations were monitored continuously. The                            Colonoscope was introduced through the anus and                            advanced to the the cecum, identified by                            appendiceal orifice and ileocecal valve. The                            ileocecal valve, appendiceal orifice, and rectum                            were photographed. The quality of the bowel                            preparation was excellent. The colonoscopy was                            performed without difficulty. The patient tolerated                            the procedure well. The bowel preparation used was                            SUPREP via split dose instruction. Scope In: 12:01:31 PM Scope Out: 12:17:12 PM Scope Withdrawal Time: 0 hours 10 minutes 57 seconds  Total Procedure Duration: 0 hours 15 minutes 41 seconds  Findings:                 Multiple diverticula were found in the sigmoid                            colon.  The exam was otherwise without abnormality on                            direct and retroflexion views. Complications:            No immediate complications. Estimated blood loss:                            None. Estimated Blood Loss:     Estimated blood loss: none. Impression:               - Diverticulosis in the sigmoid colon.                           - The examination was otherwise normal on direct                            and retroflexion views.                           - No specimens collected. Recommendation:           - Repeat colonoscopy in 5 years for surveillance                            (personal history).                           - Patient has a contact number available for                            emergencies. The signs and symptoms of potential                            delayed complications were discussed with the                             patient. Return to normal activities tomorrow.                            Written discharge instructions were provided to the                            patient.                           - Resume previous diet.                           - Continue present medications. Wilhemina Bonito. Marina Goodell, MD 02/17/2021 12:23:49 PM This report has been signed electronically.

## 2021-02-17 NOTE — Progress Notes (Signed)
HISTORY OF PRESENT ILLNESS:   Joan Robinson is a 61 y.o. female with multiple medical problems as listed below who has been followed in this office for GERD, history of peptic ulcer disease, history of C. difficile diarrhea, history of multiple and advanced adenomatous colon polyps, functional GI complaints, and more recently evaluations regarding a large hiatal hernia-felt to be symptomatic.  She also has chronic back pain for which she takes hydrocodone.  She was evaluated by the GI physician assistant in this office December 28, 2020 regarding nausea, refractory reflux, and large hiatal hernia identified on chest CT.  She was also noted to have gallstones.  She has noticed progressive early satiety over time.  Constant nausea with occasional vomiting.  Sometimes difficulty sleeping due to discomfort and a fullness sensation in the chest.  On January 02, 2021 she underwent upper endoscopy.  She was found to have a large hiatal hernia with complicating features including a probable paraesophageal component.  The duodenum was somewhat compressed.  She was prescribed pantoprazole twice daily and Carafate added.  She was set up for upper GI series which was completed January 10, 2021.  The impression was as follows:  IMPRESSION: Complex hiatal hernia as described. The GE junction is just below the LEFT hemidiaphragm at the same level as the antropyloric stomach. The fundus shows intermittent herniation through the esophageal hiatus and the remainder of the stomach is herniated into the chest and inverted. The "fundus" that is seen on RF series 24 could be also exaggerated by the presence of intermittent dilation of a large gastric diverticulum.   No frank obstruction though with stasis in the stomach due to organ position. This is also associated with abundant gastroesophageal reflux.   Duodenal diverticula.   Given complexity of the above findings would suggest comparison with prior CT imaging  if available to determine whether this is worsened and to better delineate the anatomy seen on today's exam. By report the patient has imaging from [facility]. If this is made available an addendum could be provided.     Electronically Signed   By: Zetta Bills M.D.   On: 01/10/2021 11:08  I have referred her to Dr. Johnathan Hausen for a surgical opinion regarding hernia repair.  She has an appointment later today.  She states that her reflux symptoms persist, though may be slightly better with higher dose and multiple medical therapies.  She continues with nausea and requests Zofran.  No vomiting since her last visit.  She does describe decreased taste though normal smell.  She also reports 2 episodes of rectal bleeding with blood admixed with the stool.  She states this is different than previous episodes of bleeding that have been attributed to hemorrhoids.  She does have a history of multiple and large adenomatous colon polyps with previous examinations 2003, 2006, 2014, and most recently 2018.  She is also known to have sigmoid diverticulosis.  She is due for surveillance colonoscopy this year.     REVIEW OF SYSTEMS:   All non-GI ROS negative unless otherwise stated in the HPI except for sinus and allergy, anxiety, arthritis, back pain, cough, fatigue, headaches, heart murmur, itching, muscle cramps, night sweats, nosebleeds, skin rash, sleeping problems, sore throat, ankle swelling, swollen lymph glands, urinary leakage, voice change, shortness of breath       Past Medical History:  Diagnosis Date   Adenomatous colon polyp     Allergic rhinitis     Anemia 10/10/2011   Anxiety  and depression 01/17/2007    Qualifier: Diagnosis of  By: Loanne Drilling MD, Sean A    Arthritis 07/24/2013    Likely inflammatory and following with Dr Lynnda Shields of [facility]  rheumatology   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   BCC (basal cell carcinoma of skin) 06/01/2012    Leg Follows with Dr [place] Crane   Bipolar disorder ([place])  01/17/2007    Qualifier: Diagnosis of  By: Loanne Drilling MD, Sean A    Chicken pox as a child    X 2   Colitis, Clostridium difficile 5/11,6/11   Colon polyp      benign   Diverticulosis     Diverticulosis     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   Headache(784.0) 08/17/2012   Hyperlipidemia     Hypertension     Hypothyroidism     Hypothyroidism 08/24/2006    Qualifier: Diagnosis of  By: Loanne Drilling MD, Sean A     IBS (irritable bowel syndrome) 07/27/2016   Lower back pain     Macular pucker, bilateral 10/10/2011   Mumps as a child   Obesity 11/01/2013   Perimenopausal 01/16/2012   Preventative health care 10/10/2011   PUD (peptic ulcer disease)     Right knee pain 05/10/2012   Sinusitis acute 10/10/2011   Sleep apnea 04/27/2016   Staph aureus infection 12/18/2011    Recurrent lesions in nares   Tobacco abuse     Urinary incontinence             Past Surgical History:  Procedure Laterality Date   ABDOMINAL HYSTERECTOMY       ABDOMINAL HYSTERECTOMY   2011    complete   COLONOSCOPY       [place]   EYE SURGERY   03/03/14    Surgery on right eye for epiretinal membrane (vitreous peel)    Gated Spect wall motion stress cardiolite   11/05/2001   POLYPECTOMY       TUBAL LIGATION   1995   UTERINE SUSPENSION        mesh   VITRECTOMY Bilateral 03/03/14      Social History Joan Robinson  reports that she quit smoking about 11 years ago. Her smoking use included cigarettes. She has a 30.00 pack-year smoking history. She has never used smokeless tobacco. She reports that she does not drink alcohol and does not use drugs.   family history includes Aneurysm in her father; Arthritis in her sister; Colon cancer in her paternal uncle; Colon polyps in her father; Heart disease in her mother; Hyperlipidemia in her mother; Hypertension in her mother and sister; Irritable bowel syndrome in her daughter; Mental illness in her daughter and son;  Other in her daughter, father, mother, sister, and sister; Stroke in her mother.        Allergies  Allergen Reactions   Keflex [Cephalexin] Diarrhea   Aspirin        REACTION: difficulty breathing   Butamben-Tetracaine-Benzocaine        Mouth sores   Dicyclomine Hcl        REACTION: mouth ulcers   Metronidazole        REACTION: hives, mouth ulcers   Moxifloxacin        REACTION: increased heart rate, nausea   Nsaids        REACTION: difficulty breathing   Phenergan [Promethazine Hcl]        "knocks" pt out for about 3 days  Quetiapine        Somnolence. Slept for 36 hours straight.   Sulfamethoxazole-Trimethoprim        REACTION: increased heart rate, nausea   Cymbalta [Duloxetine Hcl] Rash   Levothyroxine Palpitations   Moxifloxacin Hcl In Nacl Palpitations   Orphenadrine Citrate Palpitations          PHYSICAL EXAMINATION: Vital signs: BP 110/82    Pulse 69    Ht 5\' 6"  (1.676 m)    Wt 197 lb 6 oz (89.5 kg)    BMI 31.86 kg/m   Constitutional: generally well-appearing, no acute distress Psychiatric: alert and oriented x3, cooperative, flat affect Eyes: extraocular movements intact, anicteric, conjunctiva pink Mouth: oral pharynx moist, no lesions Neck: supple no lymphadenopathy Cardiovascular: heart regular rate and rhythm.   Lungs: clear to auscultation bilaterally Abdomen: soft, nontender, nondistended, no obvious ascites, no peritoneal signs, normal bowel sounds, no organomegaly Rectal: Deferred until colonoscopy Extremities: no clubbing or cyanosis.  Trace lower extremity edema bilaterally Skin: no lesions on visible extremities Neuro: No focal deficits.  Cranial nerves intact   ASSESSMENT:   1.  GERD.  Ongoing symptoms despite high-dose PPI, Carafate, H2 receptor antagonist therapy.  Almost certainly due to the presence of large complicated hiatal hernia.  See below. 2.  Large complex hiatal hernia as described.  This is responsible for her refractory reflux.   I also believe that she has intermittent outlet obstructive symptoms with early satiety and vomiting.  She is seeing Dr. Hassell Done later this afternoon for a surgical opinion regarding possible hernia repair. 3.  Nausea 4.  History of multiple advanced adenomatous colon polyps.  Due for surveillance this year 5.  Rectal bleeding as described.  Etiology unclear 6.  Multiple medical problems     PLAN:   1.  Reflux precautions 2.  Continue GI medical therapies 3.  Keep appointment with Dr. Hassell Done, general surgery, this afternoon 4.  Prescribe Zofran 4 mg; #30; 2 refills.  Take 1 or 2 every 4-6 hours as needed for nausea 5.  Colonoscopy.  This to evaluate rectal bleeding of uncertain etiology in a patient with a history of multiple advanced adenomatous colon polyps.The nature of the procedure, as well as the risks, benefits, and alternatives were carefully and thoroughly reviewed with the patient. Ample time for discussion and questions allowed. The patient understood, was satisfied, and agreed to proceed.   Complete H&P from 02/08/2021.  No interval change

## 2021-02-17 NOTE — Patient Instructions (Addendum)
Read all of the handouts given to you by your recovery room nurse.  You will need another colonoscopy in 5 years.   YOU HAD AN ENDOSCOPIC PROCEDURE TODAY AT [facility]:   Refer to the procedure report that was given to you for any specific questions about what was found during the examination.  If the procedure report does not answer your questions, please call your gastroenterologist to clarify.  If you requested that your care partner not be given the details of your procedure findings, then the procedure report has been included in a sealed envelope for you to review at your convenience later.  YOU SHOULD EXPECT: Some feelings of bloating in the abdomen. Passage of more gas than usual.  Walking can help get rid of the air that was put into your GI tract during the procedure and reduce the bloating. If you had a lower endoscopy (such as a colonoscopy or flexible sigmoidoscopy) you may notice spotting of blood in your stool or on the toilet paper. If you underwent a bowel prep for your procedure, you may not have a normal bowel movement for a few days.  Please Note:  You might notice some irritation and congestion in your nose or some drainage.  This is from the oxygen used during your procedure.  There is no need for concern and it should clear up in a day or so.  SYMPTOMS TO REPORT IMMEDIATELY:  Following lower endoscopy (colonoscopy or flexible sigmoidoscopy):  Excessive amounts of blood in the stool  Significant tenderness or worsening of abdominal pains  Swelling of the abdomen that is new, acute  Fever of 100F or higher  For urgent or emergent issues, a gastroenterologist can be reached at any hour by calling [phone]. Do not use MyChart messaging for urgent concerns.    DIET:  We do recommend a small meal at first, but then you may proceed to your regular diet.  Drink plenty of fluids but you should avoid alcoholic beverages for 24 hours. Try to eat a high fiber  diet,and drink plenty of water.  ACTIVITY:  You should plan to take it easy for the rest of today and you should NOT DRIVE or use heavy machinery until tomorrow (because of the sedation medicines used during the test).    FOLLOW UP: Our staff will call the number listed on your records 48-72 hours following your procedure to check on you and address any questions or concerns that you may have regarding the information given to you following your procedure. If we do not reach you, we will leave a message.  We will attempt to reach you two times.  During this call, we will ask if you have developed any symptoms of COVID 19. If you develop any symptoms (ie: fever, flu-like symptoms, shortness of breath, cough etc.) before then, please call [phone].  If you test positive for Covid 19 in the 2 weeks post procedure, please call and report this information to [place].     SIGNATURES/CONFIDENTIALITY: You and/or your care partner have signed paperwork which will be entered into your electronic medical record.  These signatures attest to the fact that that the information above on your After Visit Summary has been reviewed and is understood.  Full responsibility of the confidentiality of this discharge information lies with you and/or your care-partner.

## 2021-02-17 NOTE — Progress Notes (Signed)
A and O x3. Report to RN. Tolerated MAC anesthesia well.

## 2021-02-21 ENCOUNTER — Telehealth: Payer: Self-pay

## 2021-02-21 NOTE — Telephone Encounter (Signed)
°  Follow up Call-  Call back number 02/17/2021 01/02/2021  Post procedure Call Back phone  # [phone]  Permission to leave phone message Yes Yes  Some recent data might be hidden     Patient questions:  Do you have a fever, pain , or abdominal swelling? No. Pain Score  0 *  Have you tolerated food without any problems? Yes.    Have you been able to return to your normal activities? Yes.    Do you have any questions about your discharge instructions: Diet   No. Medications  No. Follow up visit  No.  Do you have questions or concerns about your Care? No.  Actions: * If pain score is 4 or above: No action needed, pain <4.

## 2021-03-17 ENCOUNTER — Encounter (HOSPITAL_COMMUNITY): Payer: Managed Care, Other (non HMO)

## 2021-03-17 ENCOUNTER — Other Ambulatory Visit: Payer: Self-pay | Admitting: Physician Assistant

## 2021-04-04 NOTE — Progress Notes (Signed)
Sent message, via epic in basket, requesting orders in epic from surgeon.  

## 2021-04-05 ENCOUNTER — Telehealth: Payer: Self-pay | Admitting: Internal Medicine

## 2021-04-05 ENCOUNTER — Other Ambulatory Visit: Payer: Self-pay | Admitting: Internal Medicine

## 2021-04-05 DIAGNOSIS — R110 Nausea: Secondary | ICD-10-CM

## 2021-04-05 DIAGNOSIS — K219 Gastro-esophageal reflux disease without esophagitis: Secondary | ICD-10-CM

## 2021-04-05 DIAGNOSIS — K449 Diaphragmatic hernia without obstruction or gangrene: Secondary | ICD-10-CM

## 2021-04-05 DIAGNOSIS — R6881 Early satiety: Secondary | ICD-10-CM

## 2021-04-05 MED ORDER — PANTOPRAZOLE SODIUM 40 MG PO TBEC: 40.0000 mg | DELAYED_RELEASE_TABLET | Freq: Two times a day (BID) | ORAL | 3 refills | Status: DC

## 2021-04-05 NOTE — Telephone Encounter (Signed)
90 day Pantoprazole sent. ?

## 2021-04-05 NOTE — Telephone Encounter (Signed)
90 day supply of carafate sent ?

## 2021-04-05 NOTE — Telephone Encounter (Signed)
Inbound call from pharmacy requesting for Carafate and Protonix medications to be changed to 90 day supply please. ?

## 2021-04-13 NOTE — Patient Instructions (Addendum)
DUE TO COVID-19 ONLY ONE VISITOR  (aged 61 and older)  IS ALLOWED TO COME WITH YOU AND STAY IN THE WAITING ROOM ONLY DURING PRE OP AND PROCEDURE.   ?**NO VISITORS ARE ALLOWED IN THE SHORT STAY AREA OR RECOVERY ROOM!!** ? ?IF YOU WILL BE ADMITTED INTO THE HOSPITAL YOU ARE ALLOWED ONLY TWO SUPPORT PEOPLE DURING VISITATION HOURS ONLY (7 AM -8PM)   ?The support person(s) must pass our screening, gel in and out, and wear a mask at all times, including in the patient?s room. ?Patients must also wear a mask when staff or their support person are in the room. ?Visitors GUEST BADGE MUST BE WORN VISIBLY  ?One adult visitor may remain with you overnight and MUST be in the room by 8 P.M. ?  ? ? Your procedure is scheduled on: 05/01/21 ? ? Report to [facility] Main Entrance ? ?  Report to admitting at 5:15 AM ? ? Call this number if you have problems the morning of surgery [phone] ? ? Do not eat food :After Midnight. ? ? After Midnight you may have the following liquids until 4:15 AM DAY OF SURGERY ? ?Water ?Black Coffee (sugar ok, NO MILK/CREAM OR CREAMERS)  ?Tea (sugar ok, NO MILK/CREAM OR CREAMERS) regular and decaf                             ?Plain Jell-O (NO RED)                                           ?Fruit ices (not with fruit pulp, NO RED)                                     ?Popsicles (NO RED)                                                                  ?Juice: apple, WHITE grape, WHITE cranberry ?Sports drinks like Gatorade (NO RED) ?Clear broth(vegetable,chicken,beef) ? ?FOLLOW BOWEL PREP AND ANY ADDITIONAL PRE OP INSTRUCTIONS YOU RECEIVED FROM YOUR SURGEON'S OFFICE!!! ?  ?  ?Oral Hygiene is also important to reduce your risk of infection.                                    ?Remember - BRUSH YOUR TEETH THE MORNING OF SURGERY WITH YOUR REGULAR TOOTHPASTE ? ? Take these medicines the morning of surgery with A SIP OF WATER: Inhaler, Norco, Synthroid, Metoprolol, Zofran, Pantoprazole, Topiramate.  ?                   ?           You may not have any metal on your body including hair pins, jewelry, and body piercing ? ?           Do not wear make-up, lotions, powders, perfumes, or deodorant ? ?Do not wear nail polish including gel and S&S, artificial/acrylic nails,  or any other type of covering on natural nails including finger and toenails. If you have artificial nails, gel coating, etc. that needs to be removed by a nail salon please have this removed prior to surgery or surgery may need to be canceled/ delayed if the surgeon/ anesthesia feels like they are unable to be safely monitored.  ? ?Do not shave  48 hours prior to surgery.  ? ? Do not bring valuables to the hospital. [facility] NOT ?            RESPONSIBLE   FOR VALUABLES. ? ? Contacts, dentures or bridgework may not be worn into surgery. ? ? Bring small overnight bag day of surgery. ?  ? Special Instructions: Bring a copy of your healthcare power of attorney and living will documents         the day of surgery if you haven't scanned them before. ? ?            Please read over the following fact sheets you were given: IF [place] [phone]- Apolonio Schneiders ? ?   [facility] - Preparing for Surgery ?Before surgery, you can play an important role.  Because skin is not sterile, your skin needs to be as free of germs as possible.  You can reduce the number of germs on your skin by washing with CHG (chlorahexidine gluconate) soap before surgery.  CHG is an antiseptic cleaner which kills germs and bonds with the skin to continue killing germs even after washing. ?Please DO NOT use if you have an allergy to CHG or antibacterial soaps.  If your skin becomes reddened/irritated stop using the CHG and inform your nurse when you arrive at Short Stay. ?Do not shave (including legs and underarms) for at least 48 hours prior to the first CHG shower.  You may shave your face/neck. ? ?Please follow these instructions  carefully: ? 1.  Shower with CHG Soap the night before surgery and the  morning of surgery. ? 2.  If you choose to wash your hair, wash your hair first as usual with your normal  shampoo. ? 3.  After you shampoo, rinse your hair and body thoroughly to remove the shampoo.                            ? 4.  Use CHG as you would any other liquid soap.  You can apply chg directly to the skin and wash.  Gently with a scrungie or clean washcloth. ? 5.  Apply the CHG Soap to your body ONLY FROM THE NECK DOWN.   Do   not use on face/ open      ?                     Wound or open sores. Avoid contact with eyes, ears mouth and   genitals (private parts).  ?                     Production manager,  Genitals (private parts) with your normal soap. ?            6.  Wash thoroughly, paying special attention to the area where your    surgery  will be performed. ? 7.  Thoroughly rinse your body with warm water from the neck down. ? 8.  DO NOT shower/wash with your normal soap  after using and rinsing off the CHG Soap. ?               9.  Pat yourself dry with a clean towel. ?           10.  Wear clean pajamas. ?           11.  Place clean sheets on your bed the night of your first shower and do not  sleep with pets. ?Day of Surgery : ?Do not apply any lotions/deodorants the morning of surgery.  Please wear clean clothes to the hospital/surgery center. ? ?FAILURE TO FOLLOW THESE INSTRUCTIONS MAY RESULT IN THE CANCELLATION OF YOUR SURGERY ? ?PATIENT SIGNATURE_________________________________ ? ?NURSE SIGNATURE__________________________________ ? ?________________________________________________________________________  ?

## 2021-04-13 NOTE — Progress Notes (Incomplete Revision)
COVID Vaccine Completed: yes x2 ?Date COVID Vaccine completed: 04/02/19, 05/01/19 ?Has received booster: ?COVID vaccine manufacturer: [place]  ? ?Date of COVID positive in last 90 days: no ? ?PCP - Bradly Bienenstock, NP ?Cardiologist - Dr Mat Carne ? ?Chest x-ray - CT 04/07/21 CE ?EKG - 04/14/21 Epic/chart ?Stress Test - 06/24/08 Epic ?ECHO - 2010 per pt ?Cardiac Cath - long time ago per pt ?Pacemaker/ICD device last checked: n/a ?Spinal Cord Stimulator: n/a ? ?Bowel Prep - no ? ?Sleep Study - yes, positive ?CPAP -  no ? ?Fasting Blood Sugar - n/a ?Checks Blood Sugar _____ times a day ? ?Blood Thinner Instructions: n/a ?Aspirin Instructions: ?Last Dose: ? ?Activity level: Can go up a flight of stairs and perform activities of daily living without stopping and without symptoms of chest pain. SOB due to hernia ?   ?Anesthesia review:  ? ?Patient denies shortness of breath, fever, cough and chest pain at PAT appointment ? ? ?Patient verbalized understanding of instructions that were given to them at the PAT appointment. Patient was also instructed that they will need to review over the PAT instructions again at home before surgery.  ?

## 2021-04-13 NOTE — Progress Notes (Addendum)
COVID Vaccine Completed: yes x2 ?Date COVID Vaccine completed: 04/02/19, 05/01/19 ?Has received booster: ?COVID vaccine manufacturer: [place]  ? ?Date of COVID positive in last 90 days: no ? ?PCP - Bradly Bienenstock, NP ?Cardiologist - Dr Mat Carne ? ?Chest x-ray - CT 04/07/21 CE ?EKG - 04/14/21 Epic/chart ?Stress Test - 06/24/08 Epic ?ECHO - 2010 per pt ?Cardiac Cath - long time ago per pt ?Pacemaker/ICD device last checked: n/a ?Spinal Cord Stimulator: n/a ? ?Bowel Prep - no ? ?Sleep Study - yes, positive ?CPAP -  no ? ?Fasting Blood Sugar - n/a ?Checks Blood Sugar _____ times a day ? ?Blood Thinner Instructions: n/a ?Aspirin Instructions: ?Last Dose: ? ?Activity level: Can go up a flight of stairs and perform activities of daily living without stopping and without symptoms of chest pain. SOB due to hernia ?   ?Anesthesia review:  ? ?Patient denies shortness of breath, fever, cough and chest pain at PAT appointment ? ? ?Patient verbalized understanding of instructions that were given to them at the PAT appointment. Patient was also instructed that they will need to review over the PAT instructions again at home before surgery.  ?

## 2021-04-13 NOTE — Progress Notes (Signed)
Please place orders for PAT appointment scheduled 04/14/21. ?

## 2021-04-14 ENCOUNTER — Encounter (HOSPITAL_COMMUNITY): Payer: Self-pay

## 2021-04-14 ENCOUNTER — Encounter (HOSPITAL_COMMUNITY)
Admission: RE | Admit: 2021-04-14 | Discharge: 2021-04-14 | Disposition: A | Discharge status: Home / Self Care | Payer: Managed Care, Other (non HMO) | Source: Ambulatory Visit | Attending: Surgery | Admitting: Surgery

## 2021-04-14 VITALS — BP 161/85 | HR 63 | Temp 97.9°F | Resp 18 | Ht 66.0 in | Wt 193.6 lb

## 2021-04-14 DIAGNOSIS — I10 Essential (primary) hypertension: Secondary | ICD-10-CM | POA: Insufficient documentation

## 2021-04-14 DIAGNOSIS — Z01818 Encounter for other preprocedural examination: Secondary | ICD-10-CM | POA: Insufficient documentation

## 2021-04-14 HISTORY — DX: Autoimmune thyroiditis: E06.3

## 2021-04-14 HISTORY — DX: Pneumonia, unspecified organism: J18.9

## 2021-04-14 HISTORY — DX: Family history of other specified conditions: Z84.89

## 2021-04-14 HISTORY — DX: Calculus of gallbladder without cholecystitis without obstruction: K80.20

## 2021-04-14 HISTORY — DX: Personal history of other diseases of the digestive system: Z87.19

## 2021-04-14 LAB — BASIC METABOLIC PANEL
Anion gap: 4 — ABNORMAL LOW (ref 5–15)
BUN: 13 mg/dL (ref 6–20)
CO2: 26 mmol/L (ref 22–32)
Calcium: 9.1 mg/dL (ref 8.9–10.3)
Chloride: 108 mmol/L (ref 98–111)
Creatinine, Ser: 0.84 mg/dL (ref 0.44–1.00)
GFR, Estimated: >60 mL/min (ref >60)
Glucose, Bld: 133 mg/dL — ABNORMAL HIGH (ref 70–99)
Potassium: 3.9 mmol/L (ref 3.5–5.1)
Sodium: 138 mmol/L (ref 135–145)

## 2021-04-14 LAB — CBC
HCT: 36.1 % (ref 36.0–46.0)
Hemoglobin: 12 g/dL (ref 12.0–15.0)
MCH: 29.8 pg (ref 26.0–34.0)
MCHC: 33.2 g/dL (ref 30.0–36.0)
MCV: 89.6 fL (ref 80.0–100.0)
Platelets: 208 10*3/uL (ref 150–400)
RBC: 4.03 MIL/uL (ref 3.87–5.11)
RDW: 12 % (ref 11.5–15.5)
WBC: 5.5 10*3/uL (ref 4.0–10.5)
nRBC: 0 % (ref 0.0–0.2)

## 2021-04-28 NOTE — Anesthesia Preprocedure Evaluation (Addendum)
Anesthesia Evaluation  ?Patient identified by MRN, date of birth, ID band ?Patient awake ? ? ? ?Reviewed: ?Allergy & Precautions, NPO status , Patient's Chart, lab work & pertinent test results ? ?Airway ?Mallampati: III ? ?TM Distance: >3 FB ?Neck ROM: Full ? ? ? Dental ? ?(+) Missing,  ?A few missing teeth, none loose per patient ?:   ?Pulmonary ?sleep apnea , COPD, former smoker,  ?  ?Pulmonary exam normal ?breath sounds clear to auscultation ? ? ? ? ? ? Cardiovascular ?hypertension, negative cardio ROS ?Normal cardiovascular exam ?Rhythm:Regular Rate:Normal ? ? ?  ?Neuro/Psych ? Headaches, PSYCHIATRIC DISORDERS Anxiety Depression Bipolar Disorder  Neuromuscular disease   ? GI/Hepatic ?Neg liver ROS, hiatal hernia, PUD, GERD  Medicated,  ?Endo/Other  ?Hypothyroidism Morbid obesity ? Renal/GU ?negative Renal ROS  ?negative genitourinary ?  ?Musculoskeletal ? ?(+) Arthritis ,  ? Abdominal ?  ?Peds ?negative pediatric ROS ?(+)  Hematology ?negative hematology ROS ?(+)   ?Anesthesia Other Findings ?Autoimmune urticaria ?Multiple eye problems ? ? Reproductive/Obstetrics ?negative OB ROS ? ?  ? ? ? ? ? ? ? ? ? ? ? ? ? ?  ?  ? ? ? ? ? ? ? ?Anesthesia Physical ?Anesthesia Plan ? ?ASA: 3 ? ?Anesthesia Plan: General  ? ?Post-op Pain Management: Tylenol PO (pre-op)*  ? ?Induction: Intravenous ? ?PONV Risk Score and Plan: 3 and Scopolamine patch - Pre-op, Treatment may vary due to age or medical condition, Ondansetron, Dexamethasone and Midazolam ? ?Airway Management Planned: Oral ETT ? ?Additional Equipment:  ? ?Intra-op Plan:  ? ?Post-operative Plan: Extubation in OR ? ?Informed Consent: I have reviewed the patients History and Physical, chart, labs and discussed the procedure including the risks, benefits and alternatives for the proposed anesthesia with the patient or authorized representative who has indicated his/her understanding and acceptance.  ? ? ? ?Dental advisory given ? ?Plan  Discussed with: CRNA, Anesthesiologist and Surgeon ? ?Anesthesia Plan Comments:   ? ? ? ? ? ?Anesthesia Quick Evaluation ? ?

## 2021-04-30 NOTE — H&P (Signed)
REFERRING PHYSICIAN: Roxy Cedar., MD ? ?PROVIDER: Katha Cabal, MD ? ?MRN: [id number] ?DOB: March 29, 1960 ? ?Chief Complaint: New Consultation (Hernia) ? ? ?History of Present Illness: ?Joan Robinson is a 61 y.o. female who is seen today as an office consultation at the request of Dr. Marina Goodell for evaluation of New Consultation (Hernia) ?. ? ?She was recently scoped by Yancey Flemings and found to have a large hiatal hernia most notably a paraesophageal component. Her GE junction appears to be adequate at or below the level of the diaphragm both by his endoscopy and by the upper GI. The upper GI series is impressive and the amount of stomach that is herniated and in its configuration. She is currently taking Protonix and Carafate which seems to control her reflux type symptoms. I reviewed her upper GI series with her and showed her where her diaphragm was. During that they were able to demonstrate periodic herniation of the fundus through the hiatus could with Valsalva and cough. She also has more pain when she bends and twists there is no stasis or obstruction of the stomach she did have abundant gastroesophageal reflux which ties into her past history. 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 ? ?I then went through videoscopic hiatal hernia repair with wrap probably a Nissen but may be a toupee and would likely do it on the robot. She seems to understand what we are planning and would like to get this done sooner than later. She has lost some height in adulthood probably several inches and this may play into the hiatal hernia but also she has been on steroids for bronchitis it was related to smoking and she has had long COVID issues including tinnitus. ? ?We will move forward with scheduling her for a robotic hiatal hernia repair and fundoplication ? ?Review of Systems: ?See HPI as well for other ROS. ? ?ROS  ? ?Medical History: ?Past Medical History:  ?Diagnosis Date  ? Allergic rhinitis due to allergen  ? Basal  cell carcinoma  ? Cataract cortical, senile  ? Glaucoma (increased eye pressure)  ? Headache  ? Hypertension  ? Hypothyroidism  ? PONV (postoperative nausea and vomiting)  ? Sleep apnea  ? ?Patient Active Problem List  ?Diagnosis  ? Macular pucker of both eyes - Both Eyes  ? Vitreous degeneration  ? Vitreomacular traction  ? Lattice degeneration of both retinas  ? High myopia, bilateral  ? Low-tension glaucoma of both eyes, moderate stage  ? Attention deficit disorder  ? Allergic rhinitis  ? Anxiety state  ? Arthropathy  ? Contact urticaria  ? Basal cell carcinoma of skin  ? Bipolar affective disorder (CMS-HCC)  ? Diverticulitis of colon  ? Essential hypertension  ? Juvenile osteochondrosis of foot  ? Dysfunction of eustachian tube  ? Esophageal reflux  ? Headache  ? Palpitations  ? Hypothyroidism  ? Irritable colon  ? Low back pain  ? Obesity  ? Obstructive sleep apnea  ? History of colonic polyps  ? Methicillin susceptible Staphylococcus aureus infection  ? Urinary incontinence  ? Regular astigmatism of left eye  ? Regular astigmatism of right eye  ? Pseudophakia of both eyes Toric IOL  ? Acute non-recurrent frontal sinusitis  ? Anemia  ? Cystocele, midline  ? Degeneration of lumbar intervertebral disc  ? Dyspepsia  ? Dystrophic nail  ? Polyarthralgia  ? Reflux  ? ?Past Surgical History:  ?Procedure Laterality Date  ? VITREOUS RETINAL SURGERY Right 03-03-2014  ?PPV/ICG,ERMP=ILMP/FAX-OD  ? LENS  EYE SURGERY Right 11/12/2014  ?IOL -Dr Rudene Anda  ? LENS EYE SURGERY Left 12/22/2014  ?Dr. Rudene Anda  ? LENS EYE SURGERY Bilateral 2018  ?Yag Cap OU  ? HYSTERECTOMY VAGINAL  ? LAPAROSCOPIC TUBAL LIGATION  ? ? ?Allergies  ?Allergen Reactions  ? Aspirin Other (See Comments)  ?Breathing difficulty  ? Avelox [Moxifloxacin] Nausea  ?REACTION: increased heart rate, nausea  ? Bentyl [Dicyclomine] Other (See Comments)  ?Mouth sores ? ? Bupropion Hcl Other (See Comments)  ?Worsened syptoms  ? Butamben-Tetracaine-Benzocaine Other (See Comments)   ?Mouth sores  ? Flagyl [Metronidazole Hcl] Other (See Comments)  ?Sores in throat and mouth  ? K-Flex [Orphenadrine Citrate] Palpitations  ? Levothyroxine Palpitations  ? Lortab Asa [Hydrocodone-Aspirin] Other (See Comments)  ?Sick to stomach ? ? Metronidazole Other (See Comments)  ?Mouth sores  ? Nsaids (Non-Steroidal Anti-Inflammatory Drug) Other (See Comments)  ?Breathing difficultly  ? Other Other (See Comments)  ?Numbing agent for endoscopy or anything that goes down throat gives sore..  ? Phenergan Dm [Promethazine-Dm] Other (See Comments)  ?extreme Sleepiness ? ? Quetiapine Other (See Comments)  ?Somnolence. Slept for 36 hours straight.  ? Septra [Sulfamethoxazole-Trimethoprim] Hives  ? Cymbalta [Duloxetine] Rash  ? ?Current Outpatient Medications on File Prior to Visit  ?Medication Sig Dispense Refill  ? ascorbic acid, vitamin C, (VITAMIN C) 1000 MG tablet Take 1,000 mg by mouth once daily  ? cholecalciferol (VITAMIN D3) 1,000 unit capsule Take by mouth.  ? HYDROcodone-acetaminophen (NORCO) 10-325 mg tablet Take 1 tablet by mouth every 6 (six) hours as needed for Pain. 10 tablet 0  ? krill-om-3-dha-epa-phospho-ast 1,000-230-60 mg Cap Take by mouth  ? Lactobac no.41/Bifidobact no.7 (PROBIOTIC-10 ORAL) Take by mouth  ? latanoprost (XALATAN) 0.005 % ophthalmic solution Place 1 drop into both eyes once daily 10 mL 4  ? lisdexamfetamine (VYVANSE) 70 MG capsule Take by mouth  ? magnesium oxide 500 mg Tab Take by mouth  ? metoprolol tartrate (LOPRESSOR) 25 MG tablet  ? multivitamin with minerals-lutein (OCUVITE WITH LUTEIN) 1,000 unit-200 mg-60 unit-2 mg Tab tablet Take by mouth  ? NITROSTAT 0.4 mg SL tablet  ? pantoprazole (PROTONIX) 40 MG DR tablet TAKE ONE TABLET BY MOUTH TWICE DAILY BEFORE A A MEAL  ? PREMARIN 0.625 mg/gram vaginal cream  ? prenatal vit68-iron-FA no6-dha 28 mg iron- 1 mg-400 mg Cap Take by mouth once daily  ? sucralfate (CARAFATE) 1 gram tablet Take 1 g by mouth 2 (two) times daily before  meals.  ? SYNTHROID 100 mcg tablet Take 1 tablet (100 mcg total) by mouth once daily. 30 tablet 2  ? topiramate (TOPAMAX) 25 MG tablet Take 25 mg by mouth at bedtime  ? zinc 50 mg Tab Take by mouth  ? ALPRAZolam (XANAX) 0.25 MG tablet Take 0.25 mg by mouth nightly as needed. (Patient not taking: Reported on 02/08/2021)  ? calcium carbonate (TUMS E-X) 300 mg (750 mg) chewable tablet Take by mouth. (Patient not taking: Reported on 02/08/2021)  ? cetirizine (ZYRTEC) 10 MG tablet Take by mouth. (Patient not taking: Reported on 02/08/2021)  ? ginkgo biloba 40 mg Tab Take by mouth (Patient not taking: Reported on 02/08/2021)  ? methylphenidate HCl (RITALIN) 20 MG tablet Take 20 mg by mouth 2 (two) times daily (Patient not taking: Reported on 02/08/2021)  ? vitamin A 8000 UNIT capsule Take by mouth. (Patient not taking: Reported on 02/08/2021)  ? zinc citrate-phytase (ZYTAZE) 25-500 mg capsule Take 2 capsules by mouth once daily (Patient not taking: Reported on  02/08/2021)  ? ?No current facility-administered medications on file prior to visit.  ? ?Family History  ?Problem Relation Age of Onset  ? Macular degeneration Sister  ? Cataracts Mother  ? Anesthesia problems Mother  ?slow emergence  ? No Known Problems Father  ? No Known Problems Brother  ? No Known Problems Maternal Aunt  ? No Known Problems Maternal Uncle  ? No Known Problems Paternal Aunt  ? No Known Problems Paternal Uncle  ? No Known Problems Maternal Grandmother  ? No Known Problems Maternal Grandfather  ? No Known Problems Paternal Grandmother  ? No Known Problems Paternal Grandfather  ? Glaucoma Neg Hx  ? Allergic rhinitis Neg Hx  ? Amblyopia Neg Hx  ? Ankylosing spondylitis Neg Hx  ? Atrial fibrillation (Abnormal heart rhythm sometimes requiring treatment with blood thinners) Neg Hx  ? Basal cell carcinoma Neg Hx  ? Blindness Neg Hx  ? Coronary Artery Disease (Blocked arteries around heart) Neg Hx  ? Diabetes Neg Hx  ? Diabetes type I Neg Hx  ? Diabetes type II  Neg Hx  ? Graves' disease Neg Hx  ? Hashimoto's thyroiditis Neg Hx  ? High blood pressure (Hypertension) Neg Hx  ? Hyperthyroidism Neg Hx  ? Hypothyroidism Neg Hx  ? Melanoma Neg Hx  ? Neuropathy Neg Hx  ?

## 2021-05-01 ENCOUNTER — Other Ambulatory Visit: Payer: Self-pay

## 2021-05-01 ENCOUNTER — Ambulatory Visit (HOSPITAL_COMMUNITY): Payer: Managed Care, Other (non HMO) | Admitting: Anesthesiology

## 2021-05-01 ENCOUNTER — Encounter (HOSPITAL_COMMUNITY): Payer: Self-pay | Admitting: Surgery

## 2021-05-01 ENCOUNTER — Encounter (HOSPITAL_COMMUNITY)
Admission: AD | Disposition: A | Discharge status: Home / Self Care | Payer: Self-pay | Source: Ambulatory Visit | Attending: Surgery

## 2021-05-01 ENCOUNTER — Observation Stay (HOSPITAL_COMMUNITY)
Admission: AD | Admit: 2021-05-01 | Discharge: 2021-05-03 | Disposition: A | Discharge status: Home / Self Care | Payer: Managed Care, Other (non HMO) | Source: Ambulatory Visit | Attending: Surgery | Admitting: Surgery

## 2021-05-01 DIAGNOSIS — Z79899 Other long term (current) drug therapy: Secondary | ICD-10-CM | POA: Insufficient documentation

## 2021-05-01 DIAGNOSIS — I10 Essential (primary) hypertension: Secondary | ICD-10-CM | POA: Insufficient documentation

## 2021-05-01 DIAGNOSIS — K449 Diaphragmatic hernia without obstruction or gangrene: Principal | ICD-10-CM | POA: Insufficient documentation

## 2021-05-01 DIAGNOSIS — F319 Bipolar disorder, unspecified: Secondary | ICD-10-CM | POA: Diagnosis not present

## 2021-05-01 DIAGNOSIS — K219 Gastro-esophageal reflux disease without esophagitis: Secondary | ICD-10-CM | POA: Diagnosis not present

## 2021-05-01 DIAGNOSIS — Z9889 Other specified postprocedural states: Principal | ICD-10-CM

## 2021-05-01 DIAGNOSIS — E039 Hypothyroidism, unspecified: Secondary | ICD-10-CM | POA: Insufficient documentation

## 2021-05-01 DIAGNOSIS — Z87891 Personal history of nicotine dependence: Secondary | ICD-10-CM

## 2021-05-01 DIAGNOSIS — Z85828 Personal history of other malignant neoplasm of skin: Secondary | ICD-10-CM | POA: Diagnosis not present

## 2021-05-01 HISTORY — PX: XI ROBOTIC ASSISTED HIATAL HERNIA REPAIR: SHX6889

## 2021-05-01 LAB — CBC
HCT: 36 % (ref 36.0–46.0)
Hemoglobin: 11.8 g/dL — ABNORMAL LOW (ref 12.0–15.0)
MCH: 29.4 pg (ref 26.0–34.0)
MCHC: 32.8 g/dL (ref 30.0–36.0)
MCV: 89.8 fL (ref 80.0–100.0)
Platelets: 150 10*3/uL (ref 150–400)
RBC: 4.01 MIL/uL (ref 3.87–5.11)
RDW: 12 % (ref 11.5–15.5)
WBC: 12 10*3/uL — ABNORMAL HIGH (ref 4.0–10.5)
nRBC: 0 % (ref 0.0–0.2)

## 2021-05-01 LAB — CREATININE, SERUM
Creatinine, Ser: 0.81 mg/dL (ref 0.44–1.00)
GFR, Estimated: >60 mL/min (ref >60)

## 2021-05-01 SURGERY — REPAIR, HERNIA, HIATAL, ROBOT-ASSISTED
Anesthesia: General | Site: Abdomen

## 2021-05-01 MED ORDER — CHLORHEXIDINE GLUCONATE CLOTH 2 % EX PADS: 6.0000 | MEDICATED_PAD | Freq: Once | CUTANEOUS | Status: DC

## 2021-05-01 MED ORDER — AMISULPRIDE (ANTIEMETIC) 5 MG/2ML IV SOLN: 10.0000 mg | Freq: Once | INTRAVENOUS | Status: DC | PRN

## 2021-05-01 MED ORDER — LIDOCAINE HCL 2 % IJ SOLN
INTRAMUSCULAR | Status: AC
  Filled 2021-05-01: qty 20

## 2021-05-01 MED ORDER — OXYCODONE HCL 5 MG PO TABS: 5.0000 mg | ORAL_TABLET | Freq: Once | ORAL | Status: DC | PRN

## 2021-05-01 MED ORDER — ACETAMINOPHEN 500 MG PO TABS: 1000.0000 mg | ORAL_TABLET | Freq: Once | ORAL | Status: DC

## 2021-05-01 MED ORDER — ONDANSETRON HCL 4 MG/2ML IJ SOLN: 4.0000 mg | Freq: Once | INTRAMUSCULAR | Status: DC | PRN

## 2021-05-01 MED ORDER — SODIUM CHLORIDE (PF) 0.9 % IJ SOLN
INTRAMUSCULAR | Status: DC | PRN
  Administered 2021-05-01: 10 mL

## 2021-05-01 MED ORDER — PANTOPRAZOLE SODIUM 40 MG IV SOLR
40.0000 mg | Freq: Every day | INTRAVENOUS | Status: DC
  Administered 2021-05-01 – 2021-05-02 (×2): 40 mg via INTRAVENOUS
  Filled 2021-05-01 (×2): qty 10

## 2021-05-01 MED ORDER — SODIUM CHLORIDE (PF) 0.9 % IJ SOLN
INTRAMUSCULAR | Status: AC
  Filled 2021-05-01: qty 10

## 2021-05-01 MED ORDER — MIDAZOLAM HCL 2 MG/2ML IJ SOLN
INTRAMUSCULAR | Status: AC
  Filled 2021-05-01: qty 2

## 2021-05-01 MED ORDER — ROCURONIUM BROMIDE 10 MG/ML (PF) SYRINGE
PREFILLED_SYRINGE | INTRAVENOUS | Status: DC | PRN
  Administered 2021-05-01: 10 mg via INTRAVENOUS
  Administered 2021-05-01: 60 mg via INTRAVENOUS

## 2021-05-01 MED ORDER — BUPIVACAINE LIPOSOME 1.3 % IJ SUSP
INTRAMUSCULAR | Status: AC
  Filled 2021-05-01: qty 20

## 2021-05-01 MED ORDER — ONDANSETRON HCL 4 MG/2ML IJ SOLN
INTRAMUSCULAR | Status: DC | PRN
  Administered 2021-05-01: 4 mg via INTRAVENOUS

## 2021-05-01 MED ORDER — EPHEDRINE SULFATE-NACL 50-0.9 MG/10ML-% IV SOSY
PREFILLED_SYRINGE | INTRAVENOUS | Status: DC | PRN
Start: 2021-05-01 — End: 2021-05-01
  Administered 2021-05-01 (×3): 5 mg via INTRAVENOUS

## 2021-05-01 MED ORDER — EPHEDRINE 5 MG/ML INJ
INTRAVENOUS | Status: AC
  Filled 2021-05-01: qty 5

## 2021-05-01 MED ORDER — SCOPOLAMINE 1 MG/3DAYS TD PT72
1.0000 | MEDICATED_PATCH | TRANSDERMAL | Status: DC
  Administered 2021-05-01: 1.5 mg via TRANSDERMAL
  Filled 2021-05-01: qty 1

## 2021-05-01 MED ORDER — FENTANYL CITRATE (PF) 250 MCG/5ML IJ SOLN
INTRAMUSCULAR | Status: DC | PRN
  Administered 2021-05-01 (×5): 50 ug via INTRAVENOUS

## 2021-05-01 MED ORDER — LACTATED RINGERS IR SOLN
Status: DC | PRN
  Administered 2021-05-01: 1000 mL

## 2021-05-01 MED ORDER — DEXAMETHASONE SODIUM PHOSPHATE 10 MG/ML IJ SOLN
INTRAMUSCULAR | Status: DC | PRN
  Administered 2021-05-01: 10 mg via INTRAVENOUS

## 2021-05-01 MED ORDER — LIDOCAINE 2% (20 MG/ML) 5 ML SYRINGE
INTRAMUSCULAR | Status: DC | PRN
  Administered 2021-05-01: 80 mg via INTRAVENOUS

## 2021-05-01 MED ORDER — SUCCINYLCHOLINE CHLORIDE 200 MG/10ML IV SOSY
PREFILLED_SYRINGE | INTRAVENOUS | Status: AC
  Filled 2021-05-01: qty 10

## 2021-05-01 MED ORDER — FENTANYL CITRATE (PF) 250 MCG/5ML IJ SOLN
INTRAMUSCULAR | Status: AC
  Filled 2021-05-01: qty 5

## 2021-05-01 MED ORDER — KETAMINE HCL 50 MG/5ML IJ SOSY
PREFILLED_SYRINGE | INTRAMUSCULAR | Status: AC
  Filled 2021-05-01: qty 5

## 2021-05-01 MED ORDER — DEXAMETHASONE SODIUM PHOSPHATE 10 MG/ML IJ SOLN
INTRAMUSCULAR | Status: AC
  Filled 2021-05-01: qty 1

## 2021-05-01 MED ORDER — LIDOCAINE 20MG/ML (2%) 15 ML SYRINGE OPTIME
INTRAMUSCULAR | Status: DC | PRN
  Administered 2021-05-01: 1.5 mg/kg/h via INTRAVENOUS

## 2021-05-01 MED ORDER — MORPHINE SULFATE (PF) 2 MG/ML IV SOLN: 1.0000 mg | INTRAVENOUS | Status: DC | PRN

## 2021-05-01 MED ORDER — MIDAZOLAM HCL 5 MG/5ML IJ SOLN
INTRAMUSCULAR | Status: DC | PRN
  Administered 2021-05-01: 2 mg via INTRAVENOUS

## 2021-05-01 MED ORDER — ONDANSETRON HCL 4 MG/2ML IJ SOLN: 4.0000 mg | Freq: Four times a day (QID) | INTRAMUSCULAR | Status: DC | PRN

## 2021-05-01 MED ORDER — KETAMINE HCL 10 MG/ML IJ SOLN
INTRAMUSCULAR | Status: DC | PRN
  Administered 2021-05-01 (×5): 10 mg via INTRAVENOUS

## 2021-05-01 MED ORDER — CEFAZOLIN IN SODIUM CHLORIDE 3-0.9 GM/100ML-% IV SOLN
3.0000 g | INTRAVENOUS | Status: AC
  Administered 2021-05-01: 3 g via INTRAVENOUS
  Filled 2021-05-01: qty 100

## 2021-05-01 MED ORDER — PROPOFOL 10 MG/ML IV BOLUS
INTRAVENOUS | Status: DC | PRN
  Administered 2021-05-01: 150 mg via INTRAVENOUS

## 2021-05-01 MED ORDER — FENTANYL CITRATE PF 50 MCG/ML IJ SOSY: 25.0000 ug | PREFILLED_SYRINGE | INTRAMUSCULAR | Status: DC | PRN

## 2021-05-01 MED ORDER — OXYCODONE HCL 5 MG PO TABS
5.0000 mg | ORAL_TABLET | ORAL | Status: DC | PRN
  Administered 2021-05-01 – 2021-05-03 (×9): 10 mg via ORAL
  Filled 2021-05-01 (×9): qty 2

## 2021-05-01 MED ORDER — BUPIVACAINE LIPOSOME 1.3 % IJ SUSP
INTRAMUSCULAR | Status: DC | PRN
Start: 2021-05-01 — End: 2021-05-01
  Administered 2021-05-01: 20 mL

## 2021-05-01 MED ORDER — 0.9 % SODIUM CHLORIDE (POUR BTL) OPTIME
TOPICAL | Status: DC | PRN
  Administered 2021-05-01: 1000 mL

## 2021-05-01 MED ORDER — ACETAMINOPHEN 500 MG PO TABS
1000.0000 mg | ORAL_TABLET | ORAL | Status: AC
  Administered 2021-05-01: 1000 mg via ORAL
  Filled 2021-05-01: qty 2

## 2021-05-01 MED ORDER — SUGAMMADEX SODIUM 200 MG/2ML IV SOLN
INTRAVENOUS | Status: DC | PRN
  Administered 2021-05-01: 200 mg via INTRAVENOUS

## 2021-05-01 MED ORDER — CEFAZOLIN SODIUM-DEXTROSE 2-4 GM/100ML-% IV SOLN
2.0000 g | Freq: Three times a day (TID) | INTRAVENOUS | Status: AC
  Administered 2021-05-01: 2 g via INTRAVENOUS
  Filled 2021-05-01: qty 100

## 2021-05-01 MED ORDER — HEPARIN SODIUM (PORCINE) 5000 UNIT/ML IJ SOLN
5000.0000 [IU] | Freq: Three times a day (TID) | INTRAMUSCULAR | Status: DC
  Administered 2021-05-01 – 2021-05-03 (×6): 5000 [IU] via SUBCUTANEOUS
  Filled 2021-05-01 (×6): qty 1

## 2021-05-01 MED ORDER — ORAL CARE MOUTH RINSE: 15.0000 mL | Freq: Once | OROMUCOSAL | Status: AC

## 2021-05-01 MED ORDER — ONDANSETRON HCL 4 MG/2ML IJ SOLN
INTRAMUSCULAR | Status: AC
  Filled 2021-05-01: qty 2

## 2021-05-01 MED ORDER — METOPROLOL TARTRATE 5 MG/5ML IV SOLN: 5.0000 mg | Freq: Four times a day (QID) | INTRAVENOUS | Status: DC | PRN

## 2021-05-01 MED ORDER — KCL IN DEXTROSE-NACL 20-5-0.45 MEQ/L-%-% IV SOLN
INTRAVENOUS | Status: DC
  Filled 2021-05-01 (×4): qty 1000

## 2021-05-01 MED ORDER — PROPOFOL 10 MG/ML IV BOLUS
INTRAVENOUS | Status: AC
Start: 2021-05-01 — End: ?
  Filled 2021-05-01: qty 20

## 2021-05-01 MED ORDER — OXYCODONE HCL 5 MG/5ML PO SOLN: 5.0000 mg | Freq: Once | ORAL | Status: DC | PRN

## 2021-05-01 MED ORDER — ROCURONIUM BROMIDE 10 MG/ML (PF) SYRINGE
PREFILLED_SYRINGE | INTRAVENOUS | Status: AC
  Filled 2021-05-01: qty 10

## 2021-05-01 MED ORDER — ONDANSETRON 4 MG PO TBDP: 4.0000 mg | ORAL_TABLET | Freq: Four times a day (QID) | ORAL | Status: DC | PRN

## 2021-05-01 MED ORDER — LACTATED RINGERS IV SOLN: INTRAVENOUS | Status: DC

## 2021-05-01 MED ORDER — CHLORHEXIDINE GLUCONATE 0.12 % MT SOLN
15.0000 mL | Freq: Once | OROMUCOSAL | Status: AC
  Administered 2021-05-01: 15 mL via OROMUCOSAL

## 2021-05-01 MED ORDER — SUCCINYLCHOLINE CHLORIDE 200 MG/10ML IV SOSY
PREFILLED_SYRINGE | INTRAVENOUS | Status: DC | PRN
Start: 2021-05-01 — End: 2021-05-01
  Administered 2021-05-01: 160 mg via INTRAVENOUS

## 2021-05-01 SURGICAL SUPPLY — 61 items
APPLIER CLIP 5 13 M/L LIGAMAX5 (MISCELLANEOUS)
APPLIER CLIP ROT 13.4 12 LRG (CLIP)
BLADE SURG 15 STRL LF DISP TIS (BLADE) ×1 IMPLANT
BLADE SURG 15 STRL SS (BLADE) ×2
CLIP APPLIE 5 13 M/L LIGAMAX5 (MISCELLANEOUS) IMPLANT
CLIP APPLIE ROT 13.4 12 LRG (CLIP) IMPLANT
CLIP LIGATING HEM O LOK PURPLE (MISCELLANEOUS) IMPLANT
COVER SURGICAL LIGHT HANDLE (MISCELLANEOUS) ×2 IMPLANT
COVER TIP SHEARS 8 DVNC (MISCELLANEOUS) ×1 IMPLANT
COVER TIP SHEARS 8MM DA VINCI (MISCELLANEOUS) ×2
DERMABOND ADVANCED (GAUZE/BANDAGES/DRESSINGS) ×1
DERMABOND ADVANCED .7 DNX12 (GAUZE/BANDAGES/DRESSINGS) ×1 IMPLANT
DEVICE TROCAR PUNCTURE CLOSURE (ENDOMECHANICALS) IMPLANT
DRAIN PENROSE 0.5X18 (DRAIN) ×2 IMPLANT
DRAPE ARM DVNC X/XI (DISPOSABLE) ×4 IMPLANT
DRAPE COLUMN DVNC XI (DISPOSABLE) ×1 IMPLANT
DRAPE DA VINCI XI ARM (DISPOSABLE) ×8
DRAPE DA VINCI XI COLUMN (DISPOSABLE) ×2
ELECT REM PT RETURN 15FT ADLT (MISCELLANEOUS) ×2 IMPLANT
GAUZE 4X4 16PLY ~~LOC~~+RFID DBL (SPONGE) ×2 IMPLANT
GLOVE SURG LX 8.0 MICRO (GLOVE) ×2
GLOVE SURG LX STRL 8.0 MICRO (GLOVE) ×2 IMPLANT
GOWN STRL REUS W/ TWL XL LVL3 (GOWN DISPOSABLE) ×4 IMPLANT
GOWN STRL REUS W/TWL XL LVL3 (GOWN DISPOSABLE) ×8
GRASPER SUT TROCAR 14GX15 (MISCELLANEOUS) IMPLANT
IRRIG SUCT STRYKERFLOW 2 WTIP (MISCELLANEOUS) ×2
IRRIGATION SUCT STRKRFLW 2 WTP (MISCELLANEOUS) ×1 IMPLANT
KIT BASIN OR (CUSTOM PROCEDURE TRAY) ×2 IMPLANT
KIT TURNOVER KIT A (KITS) IMPLANT
MARKER SKIN DUAL TIP RULER LAB (MISCELLANEOUS) ×2 IMPLANT
NEEDLE HYPO 22GX1.5 SAFETY (NEEDLE) ×2 IMPLANT
OBTURATOR OPTICAL STANDARD 8MM (TROCAR) ×2
OBTURATOR OPTICAL STND 8 DVNC (TROCAR) ×1
OBTURATOR OPTICALSTD 8 DVNC (TROCAR) ×1 IMPLANT
PACK CARDIOVASCULAR III (CUSTOM PROCEDURE TRAY) ×2 IMPLANT
PAD POSITIONING PINK XL (MISCELLANEOUS) IMPLANT
SCISSORS LAP 5X45 EPIX DISP (ENDOMECHANICALS) IMPLANT
SEAL CANN UNIV 5-8 DVNC XI (MISCELLANEOUS) ×4 IMPLANT
SEAL XI 5MM-8MM UNIVERSAL (MISCELLANEOUS) ×8
SEALER VESSEL DA VINCI XI (MISCELLANEOUS) ×2
SEALER VESSEL EXT DVNC XI (MISCELLANEOUS) ×1 IMPLANT
SOL ANTI FOG 6CC (MISCELLANEOUS) ×1 IMPLANT
SOLUTION ANTI FOG 6CC (MISCELLANEOUS) ×1
SOLUTION ELECTROLUBE (MISCELLANEOUS) ×2 IMPLANT
SPIKE FLUID TRANSFER (MISCELLANEOUS) ×2 IMPLANT
SUT ETHIBOND 0 36 GRN (SUTURE) ×7 IMPLANT
SUT MNCRL AB 4-0 PS2 18 (SUTURE) ×4 IMPLANT
SUT VICRYL 0 UR6 27IN ABS (SUTURE) IMPLANT
SYR 10ML ECCENTRIC (SYRINGE) ×2 IMPLANT
SYR 20ML LL LF (SYRINGE) ×2 IMPLANT
TIP INNERVISION DETACH 40FR (MISCELLANEOUS) IMPLANT
TIP INNERVISION DETACH 50FR (MISCELLANEOUS) IMPLANT
TIP INNERVISION DETACH 56FR (MISCELLANEOUS) ×1 IMPLANT
TIPS INNERVISION DETACH 40FR (MISCELLANEOUS)
TOWEL OR 17X26 10 PK STRL BLUE (TOWEL DISPOSABLE) ×2 IMPLANT
TRAY FOLEY MTR SLVR 16FR STAT (SET/KITS/TRAYS/PACK) IMPLANT
TROCAR ADV FIXATION 12X100MM (TROCAR) ×2 IMPLANT
TROCAR ADV FIXATION 5X100MM (TROCAR) IMPLANT
TROCAR BLADELESS OPT 5 100 (ENDOMECHANICALS) ×2 IMPLANT
TUBING CONNECTING 10 (TUBING) IMPLANT
TUBING INSUFFLATION 10FT LAP (TUBING) ×2 IMPLANT

## 2021-05-01 NOTE — Transfer of Care (Signed)
Immediate Anesthesia Transfer of Care Note ? ?Patient: Joan Robinson ? ?Procedure(s) Performed: XI ROBOTIC ASSISTED TYPE III HIATAL HERNIA REPAIR WITH FUNDOPLICATION (Abdomen) ? ?Patient Location: PACU ? ?Anesthesia Type:General ? ?Level of Consciousness: awake, alert  and oriented ? ?Airway & Oxygen Therapy: Patient Spontanous Breathing and Patient connected to face mask oxygen ? ?Post-op Assessment: Report given to RN and Post -op Vital signs reviewed and stable ? ?Post vital signs: Reviewed and stable ? ?Last Vitals:  ?Vitals Value Taken Time  ?BP 116/60 05/01/21 1056  ?Temp    ?Pulse 80 05/01/21 1057  ?Resp 20 05/01/21 1058  ?SpO2 97 % 05/01/21 1057  ?Vitals shown include unvalidated device data. ? ?Last Pain:  ?Vitals:  ? 05/01/21 0545  ?TempSrc:   ?PainSc: 5   ?   ? ?Patients Stated Pain Goal: 4 (05/01/21 0545) ? ?Complications: No notable events documented. ?

## 2021-05-01 NOTE — Op Note (Signed)
Joan Robinson  04/23/1960 ? ? ?05/01/2021 ? ? ? ?PCP:  Jettie Booze, NP ? ? ?Surgeon: Kaylyn Lim, MD, FACS ? ?Asst:  Louanna Raw, MD, FACS ? ?Anes:  general ? ?Preop Dx: Large hiatal hernia.  Large type III mixed hernia ?Postop Dx: Same; normal variant bilateral acrcuate ligament ? ?Procedure: Xi robotic takedown of large type III hiatal hernia with multisuture repair of the hiatus and Nissen fundoplication over a 56 lighted bougie ?Location Surgery: [facility] ?Complications:  None noted ? ?EBL:   minimal cc ? ?Drains: none ? ?Description of Procedure: ? The patient was taken to OR 2 .  After anesthesia was administered and the patient was prepped  with chloroprep  and a timeout was performed.  Access to the abdomen was achieved with a 5 mm Optiview through the left upper quadrant.  Four 8 mm robotic trocars were place along with the 5 mm for the [facility] retractor.  The robot was docked.  Dissection was carried out beginning on the right side incising the sac along the right crus and then peeling it away and then gently dissected the sac away from the mediastinal tissue.  This was carried anteriorly and medially and I entered the the left most portion of the sac and did this and similarly swept away the sac from the surrounding tissue.  We were able to get around the esophagogastric junction.  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  The esophagus did appear at length and in the abdomen once this sac of been stripped down.  I excised some excess sac anteriorly and took down the short short gastrics on the left side. ? ?The hiatal defect was then closed with a series of figure-of-eight sutures using the 2-0 Tevdek we used the robot suturing and this closed the defect significantly.  The 56 lighted bougie was placed in a pretty much occupied the hiatal defect with that in place.  Around that I then brought portion of the cardia and fundus around and then  sutured back to itself with 3 interrupted simple sutures of again the same Tevdek.  Healthy wrap appeared to be in place.  The lighted bougie was removed. ? ?Patient did have this arcuate ligament variant whether a little anterior pockets on the right and left side but they are very broad-based.  In addition I surveyed the gallbladder and and it was retracted and seem to be robin's egg blue.  When completed the tap block was performed bilaterally with Exparel robot was undocked port sites were closed with 4-0 Monocryl and Dermabond. ? ?The patient tolerated the procedure well and was taken to the PACU in stable condition.   ? ? ?Matt B. Hassell Done, MD, FACS ?[facility], [place] ?[phone]  ?

## 2021-05-01 NOTE — Anesthesia Postprocedure Evaluation (Signed)
Anesthesia Post Note ? ?Patient: Joan Robinson ? ?Procedure(s) Performed: XI ROBOTIC ASSISTED TYPE III HIATAL HERNIA REPAIR WITH FUNDOPLICATION (Abdomen) ? ?  ? ?Patient location during evaluation: PACU ?Anesthesia Type: General ?Level of consciousness: awake ?Pain management: pain level controlled ?Vital Signs Assessment: post-procedure vital signs reviewed and stable ?Respiratory status: spontaneous breathing and respiratory function stable ?Cardiovascular status: stable ?Postop Assessment: no apparent nausea or vomiting ?Anesthetic complications: no ? ? ?No notable events documented. ? ?Last Vitals:  ?Vitals:  ? 05/01/21 1230 05/01/21 1244  ?BP: 124/71 120/78  ?Pulse: 67 73  ?Resp: 15 15  ?Temp: 36.7 ?C (!) 36.4 ?C  ?SpO2: 98% 100%  ?  ?Last Pain:  ?Vitals:  ? 05/01/21 1244  ?TempSrc: Oral  ?PainSc:   ? ? ?  ?  ?  ?  ?  ?  ? ?Merlinda Frederick ? ? ? ? ?

## 2021-05-01 NOTE — Anesthesia Procedure Notes (Signed)
Procedure Name: Intubation ?Date/Time: 05/01/2021 7:32 AM ?Performed by: Holden Draughon D, CRNA ?Pre-anesthesia Checklist: Patient identified, Emergency Drugs available, Suction available and Patient being monitored ?Patient Re-evaluated:Patient Re-evaluated prior to induction ?Oxygen Delivery Method: Circle system utilized ?Preoxygenation: Pre-oxygenation with 100% oxygen ?Induction Type: IV induction and Rapid sequence ?Laryngoscope Size: Mac and 3 ?Tube type: Oral ?Tube size: 7.5 mm ?Number of attempts: 1 ?Airway Equipment and Method: Stylet ?Placement Confirmation: ETT inserted through vocal cords under direct vision, positive ETCO2 and breath sounds checked- equal and bilateral ?Secured at: 21 cm ?Tube secured with: Tape ?Dental Injury: Teeth and Oropharynx as per pre-operative assessment  ? ? ? ? ?

## 2021-05-01 NOTE — Interval H&P Note (Signed)
History and Physical Interval Note: ? ?05/01/2021 ?7:20 AM ? ?Joan Robinson  has presented today for surgery, with the diagnosis of hiatal hernia.  The various methods of treatment have been discussed with the patient and family. After consideration of risks, benefits and other options for treatment, the patient has consented to  Procedure(s): ?XI ROBOTIC [place] (N/A) as a surgical intervention.  The patient's history has been reviewed, patient examined, no change in status, stable for surgery.  I have reviewed the patient's chart and labs.  Questions were answered to the patient's satisfaction.   ? ? ?[name] ? ? ?

## 2021-05-02 ENCOUNTER — Encounter (HOSPITAL_COMMUNITY): Payer: Self-pay | Admitting: Surgery

## 2021-05-02 DIAGNOSIS — K449 Diaphragmatic hernia without obstruction or gangrene: Secondary | ICD-10-CM | POA: Diagnosis not present

## 2021-05-02 NOTE — Progress Notes (Signed)
Patient ID: Joan Robinson, female   DOB: 09-17-1960, 61 y.o.   MRN: [id number] ?[facility] Progress Note:   1 Day Post-Op  ?Subjective: ?Mental status is clear.  Complaints --less sore today. ?Objective: ?Vital signs in last 24 hours: ?Temp:  [97.4 ?F (36.3 ?C)-99.1 ?F (37.3 ?C)] 98.8 ?F (37.1 ?C) (04/18 1227) ?Pulse Rate:  [64-99] 92 (04/18 1227) ?Resp:  [15-17] 16 (04/18 1227) ?BP: (102-130)/(53-78) 115/61 (04/18 1227) ?SpO2:  [94 %-100 %] 94 % (04/18 1227) ? ?Intake/Output from previous day: ?04/17 0701 - 04/18 0700 ?In: 3536.6 [P.O.:290; I.V.:3146.6; IV Piggyback:100] ?Out: 2200 [Urine:2200] ?Intake/Output this shift: ?Total I/O ?In: 400.1 [P.O.:100; I.V.:300.1] ?Out: 400 [Urine:400] ? ?Physical Exam: Work of breathing is OK.  Has started clears.  Will advance to full liquids.  Hopeful discharge tomorrow ? ?Lab Results:  ?Results for orders placed or performed during the hospital encounter of 05/01/21 (from the past 48 hour(s))  ?CBC     Status: Abnormal  ? Collection Time: 05/01/21 12:13 PM  ?Result Value Ref Range  ? WBC 12.0 (H) 4.0 - 10.5 K/uL  ? RBC 4.01 3.87 - 5.11 MIL/uL  ? Hemoglobin 11.8 (L) 12.0 - 15.0 g/dL  ? HCT 36.0 36.0 - 46.0 %  ? MCV 89.8 80.0 - 100.0 fL  ? MCH 29.4 26.0 - 34.0 pg  ? MCHC 32.8 30.0 - 36.0 g/dL  ? RDW 12.0 11.5 - 15.5 %  ? Platelets 150 150 - 400 K/uL  ? nRBC 0.0 0.0 - 0.2 %  ?  Comment: Performed at [facility], [address], [place], [place] [address]  ?Creatinine, serum     Status: None  ? Collection Time: 05/01/21 12:13 PM  ?Result Value Ref Range  ? Creatinine, Ser 0.81 0.44 - 1.00 mg/dL  ? GFR, Estimated >60 >60 mL/min  ?  Comment: (NOTE) ?Calculated using the CKD-EPI Creatinine Equation (2021) ?Performed at [facility], [address] ?[place], [place] [address] ?  ? ? ?Radiology/Results: ?No results found. ? ?Anti-infectives: ?Anti-infectives (From admission, onward)  ? ? Start     Dose/Rate Route Frequency Ordered  Stop  ? 05/01/21 1600  ceFAZolin (ANCEF) IVPB 2g/100 mL premix       ? 2 g ?200 mL/hr over 30 Minutes Intravenous Every 8 hours 05/01/21 1249 05/01/21 1913  ? 05/01/21 0600  ceFAZolin (ANCEF) IVPB 3g/100 mL premix       ? 3 g ?200 mL/hr over 30 Minutes Intravenous On call to O.R. 05/01/21 4403 05/01/21 0734  ? ?  ? ? ?Assessment/Plan: ?Problem List: ?Patient Active Problem List  ? Diagnosis Date Noted  ? Status post laparoscopic Nissen fundoplication 05/01/2021  ? IBS (irritable bowel syndrome) 07/27/2016  ? High-tone pelvic floor dysfunction 05/09/2016  ? Midline cystocele 05/09/2016  ? Vaginal atrophy 05/09/2016  ? Bladder prolapse, female, acquired 05/07/2016  ? Dystrophic nail 04/29/2016  ? Fatigue 04/27/2016  ? Sleep apnea 04/27/2016  ? UTI (urinary tract infection) 09/11/2015  ? Cataract cortical, senile, left 12/20/2014  ? Rectal lesion 10/10/2014  ? Other type of migraine without status migrainosus 05/06/2014  ? Dyspepsia 05/06/2014  ? Postmenopausal estrogen deficiency 05/06/2014  ? Screening for breast cancer 05/06/2014  ? Bilateral low-tension glaucoma, indeterminate stage 12/29/2013  ? High myopia, bilateral 12/29/2013  ? 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 Obesity 11/01/2013  ? Eustachian tube dysfunction 10/08/2013  ? Otalgia of left ear 10/08/2013  ?  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 Arthritis 07/24/2013  ? Anxiety attack 03/08/2013  ? Paresthesia 12/22/2012  ? Headache(784.0) 08/17/2012  ? BCC (basal cell carcinoma of skin) 06/01/2012  ? Left arm pain 05/10/2012  ? Right knee pain 05/10/2012  ? 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 ADD (attention deficit disorder) 04/13/2012  ? Perimenopausal 01/16/2012  ? Macular pucker of both eyes 10/18/2011  ? Vitreous degeneration 10/18/2011  ? Anemia 10/10/2011  ? Preventative health care 10/10/2011  ? Urticaria, chronic 10/10/2011  ? Macular pucker, bilateral 10/10/2011  ? Lower back pain   ?  DIVERTICULITIS-COLON 07/25/2009  ? Diverticulitis of colon 07/25/2009  ? Essential hypertension 06/09/2009  ? HELICOBACTER PYLORI GASTRITIS, HX OF 09/06/2008  ? GERD 07/27/2008  ? Bipolar disorder (HCC) 01/17/2007  ? Hypothyroidism 08/24/2006  ? Allergic rhinitis 08/24/2006  ? URINARY INCONTINENCE 08/24/2006  ? ? ?Post repair of giant hiatal hernia with Nissen fundopication over a 56 dilator.   ?1 Day Post-Op  ? ? LOS: 1 day  ? ?Matt B. Daphine Deutscher, MD, FACS ? ?[facility] ?[phone] to reach the surgeon on call.   ? ?05/02/2021 12:40 PM  ?

## 2021-05-02 NOTE — Progress Notes (Signed)
Transition of Care (TOC) Screening Note ? ?Patient Details  ?Name: Joan Robinson ?Date of Birth: [birth year] ? ?Transition of Care (TOC) CM/SW Contact:    ?Sherie Don, LCSW ?Phone Number: ?05/02/2021, 11:14 AM ? ?Transition of Care Department [facility]) has reviewed patient and no TOC needs have been identified at this time. We will continue to monitor patient advancement through interdisciplinary progression rounds. If new patient transition needs arise, please place a TOC consult. ?

## 2021-05-03 DIAGNOSIS — K449 Diaphragmatic hernia without obstruction or gangrene: Secondary | ICD-10-CM | POA: Diagnosis not present

## 2021-05-03 MED ORDER — ONDANSETRON HCL 4 MG PO TABS: 4.0000 mg | ORAL_TABLET | Freq: Three times a day (TID) | ORAL | 2 refills | Status: DC | PRN

## 2021-05-03 MED ORDER — HYDROCODONE-ACETAMINOPHEN 5-325 MG PO TABS: 1.0000 | ORAL_TABLET | Freq: Four times a day (QID) | ORAL | 0 refills | Status: DC | PRN

## 2021-05-03 NOTE — Plan of Care (Signed)
Instructions were reviewed with patient. All questions were answered. Patient was transported to main entrance by wheelchair. ° °

## 2021-05-03 NOTE — Discharge Summary (Signed)
Physician Discharge Summary  ?Patient ID: ?Joan Robinson ?MRN: [id number] ?DOB/AGE: 1960/09/19 60 y.o. ? ?PCP: April Manson, NP ? ?Admit date: 05/01/2021 ?Discharge date: 05/03/2021 ? ?Admission Diagnoses:  large type III hiatal hernia ? ?Discharge Diagnoses:  same post robotic Xi repair and Nissen fundoplication over a 56 bougie  ?Principal Problem: ?  Status post laparoscopic Nissen fundoplication ? ? ?Surgery:  Xi robotic repair of hiatus and Nissen fundoplication ? ?Discharged Condition: improved ? ?Hospital Course:   had surgery and started on clear liquids.  These were advanced slowly until she was ready for discharge on pod 2  ? ?Consults: none ? ?Significant Diagnostic Studies: none ? ? ? ?Discharge Exam: ?Blood pressure 125/78, pulse 82, temperature 98.7 ?F (37.1 ?C), temperature source Oral, resp. rate 18, height 5\' 6"  (1.676 m), weight 87.8 kg, SpO2 91 %. ?Incisions ok ? ?Disposition: Discharge disposition: 01-Home or Self Care ? ? ? ? ? ? ?Discharge Instructions   ? ? Call MD for:  redness, tenderness, or signs of infection (pain, swelling, redness, odor or green/yellow discharge around incision site)   Complete by: As directed ?  ? Diet - low sodium heart healthy   Complete by: As directed ?  ? Discharge instructions   Complete by: As directed ?  ? Full liquids for one week then pureed foods for 3 weeks.   ?Stand up and let gravity help with esophageal drainage when eating if necessary  ? Increase activity slowly   Complete by: As directed ?  ? ?  ? ?Allergies as of 05/03/2021   ? ?   Reactions  ? Aspirin Shortness Of Breath  ? Nsaids Shortness Of Breath  ? Keflex [cephalexin] Diarrhea  ? Butamben-tetracaine-benzocaine   ? Mouth sores  ? Dicyclomine Hcl   ? REACTION: mouth ulcers  ? Metronidazole Hives  ? mouth ulcers  ? Phenergan [promethazine Hcl]   ? "knocks" pt out for about 3 days  ? Quetiapine   ? Somnolence. Slept for 36 hours straight.  ? Sulfamethoxazole-trimethoprim   ? REACTION: increased  heart rate, nausea  ? Benay Spice [lifitegrast]   ? Burned eyes  ? Cymbalta [duloxetine Hcl] Rash  ? Levothyroxine Palpitations  ? Pt must use brand SYNTHROID  ? Moxifloxacin Nausea Only, Palpitations  ? REACTION: increased heart rate  ? Moxifloxacin Hcl In Nacl Palpitations  ? Orphenadrine Citrate Palpitations  ? ?  ? ?  ?Medication List  ?  ? ?TAKE these medications   ? ?5-HTP 100 MG Caps ?Take 100 mg by mouth daily. ?  ?albuterol 108 (90 Base) MCG/ACT inhaler ?Commonly known as: VENTOLIN HFA ?Inhale 2 puffs into the lungs every 6 (six) hours as needed for wheezing or shortness of breath. ?  ?Co Q-10 200 MG Caps ?Take 200 mg by mouth daily. ?  ?conjugated estrogens 0.625 MG/GM vaginal cream ?Commonly known as: Premarin ?Place 1 Applicatorful vaginally every other day. ?What changed: when to take this ?  ?HYDROcodone-acetaminophen 10-325 MG tablet ?Commonly known as: NORCO ?TAKE ONE TABLET BY MOUTH THREE TIMES DAILY AS NEEDED ?What changed: Another medication with the same name was added. Make sure you understand how and when to take each. ?  ?HYDROcodone-acetaminophen 5-325 MG tablet ?Commonly known as: NORCO/VICODIN ?Take 1 tablet by mouth every 6 (six) hours as needed for moderate pain. ?What changed: You were already taking a medication with the same name, and this prescription was added. Make sure you understand how and when to take each. ?  ?  latanoprost 0.005 % ophthalmic solution ?Commonly known as: XALATAN ?Place 1 drop into both eyes at bedtime. ?  ?levothyroxine 100 MCG tablet ?Commonly known as: SYNTHROID ?Take 1 tablet (100 mcg total) by mouth daily before breakfast. ?  ?lisdexamfetamine 70 MG capsule ?Commonly known as: VYVANSE ?Take 70 mg by mouth daily. ?  ?magnesium gluconate 500 MG tablet ?Commonly known as: MAGONATE ?Take 500 mg by mouth daily. ?  ?metoprolol tartrate 25 MG tablet ?Commonly known as: LOPRESSOR ?TAKE 1/2 TABLET BY MOUTH DAILY ?  ?nitroGLYCERIN 0.4 MG SL tablet ?Commonly known as:  NITROSTAT ?Place 0.4 mg under the tongue every 5 (five) minutes as needed for chest pain. ?  ?ondansetron 4 MG tablet ?Commonly known as: ZOFRAN ?Take 1 tablet (4 mg total) by mouth every 8 (eight) hours as needed for nausea or vomiting. Take 1-2 tablets every 4-6 hours as needed for nausea ?What changed:  ?how much to take ?how to take this ?when to take this ?reasons to take this ?Another medication with the same name was removed. Continue taking this medication, and follow the directions you see here. ?  ?pantoprazole 40 MG tablet ?Commonly known as: PROTONIX ?Take 1 tablet (40 mg total) by mouth 2 (two) times daily before a meal. ?  ?PROBIOTIC-10 PO ?Take 1 tablet by mouth daily. ?  ?sucralfate 1 GM/10ML suspension ?Commonly known as: CARAFATE ?TAKE 10ml BY MOUTH FOUR TIMES DAILY ?  ?topiramate 25 MG tablet ?Commonly known as: TOPAMAX ?Take 25 mg by mouth 2 (two) times daily. ?  ?Vitamin D3 50 MCG (2000 UT) Tabs ?Take 2,000 Units by mouth daily. ?  ?zinc gluconate 50 MG tablet ?Take 1 tablet (50 mg total) by mouth daily. ?  ? ?  ? ? ? ?Signed: ?Valarie Merino ?05/03/2021, 10:39 AM ?  ? ?

## 2021-06-02 ENCOUNTER — Other Ambulatory Visit: Payer: Self-pay | Admitting: Surgery

## 2021-06-02 DIAGNOSIS — R1030 Lower abdominal pain, unspecified: Secondary | ICD-10-CM

## 2021-06-08 ENCOUNTER — Ambulatory Visit
Admission: RE | Admit: 2021-06-08 | Discharge: 2021-06-08 | Disposition: A | Discharge status: Home / Self Care | Payer: BC Managed Care – PPO | Source: Ambulatory Visit | Attending: Surgery | Admitting: Surgery

## 2021-06-08 DIAGNOSIS — R1030 Lower abdominal pain, unspecified: Secondary | ICD-10-CM

## 2021-06-08 MED ORDER — IOPAMIDOL (ISOVUE-300) INJECTION 61%
100.0000 mL | Freq: Once | INTRAVENOUS | Status: AC | PRN
  Administered 2021-06-08: 100 mL via INTRAVENOUS

## 2021-06-14 ENCOUNTER — Other Ambulatory Visit: Payer: Self-pay | Admitting: Surgery

## 2021-06-14 ENCOUNTER — Other Ambulatory Visit (HOSPITAL_COMMUNITY): Payer: Self-pay | Admitting: Surgery

## 2021-06-14 DIAGNOSIS — Z8719 Personal history of other diseases of the digestive system: Secondary | ICD-10-CM

## 2021-06-15 ENCOUNTER — Other Ambulatory Visit (HOSPITAL_COMMUNITY): Payer: Self-pay | Admitting: Surgery

## 2021-06-15 ENCOUNTER — Ambulatory Visit (HOSPITAL_COMMUNITY)
Admission: RE | Admit: 2021-06-15 | Discharge: 2021-06-15 | Disposition: A | Discharge status: Home / Self Care | Payer: BC Managed Care – PPO | Source: Ambulatory Visit | Attending: Surgery | Admitting: Surgery

## 2021-06-15 DIAGNOSIS — Z9889 Other specified postprocedural states: Secondary | ICD-10-CM | POA: Insufficient documentation

## 2021-06-15 DIAGNOSIS — Z8719 Personal history of other diseases of the digestive system: Secondary | ICD-10-CM

## 2021-06-27 ENCOUNTER — Other Ambulatory Visit (HOSPITAL_COMMUNITY): Payer: Self-pay | Admitting: Radiology

## 2021-06-27 ENCOUNTER — Other Ambulatory Visit (HOSPITAL_COMMUNITY): Payer: Self-pay | Admitting: Surgery

## 2021-06-27 DIAGNOSIS — R112 Nausea with vomiting, unspecified: Secondary | ICD-10-CM

## 2021-06-27 DIAGNOSIS — K219 Gastro-esophageal reflux disease without esophagitis: Secondary | ICD-10-CM

## 2021-06-28 ENCOUNTER — Other Ambulatory Visit (HOSPITAL_COMMUNITY): Payer: Self-pay | Admitting: Radiology

## 2021-06-28 ENCOUNTER — Other Ambulatory Visit (HOSPITAL_COMMUNITY): Payer: Self-pay | Admitting: Surgery

## 2021-06-28 ENCOUNTER — Ambulatory Visit (HOSPITAL_COMMUNITY)
Admission: RE | Admit: 2021-06-28 | Discharge: 2021-06-28 | Disposition: A | Discharge status: Home / Self Care | Payer: BC Managed Care – PPO | Source: Ambulatory Visit | Attending: Surgery | Admitting: Surgery

## 2021-06-28 DIAGNOSIS — R112 Nausea with vomiting, unspecified: Secondary | ICD-10-CM

## 2021-06-28 DIAGNOSIS — K219 Gastro-esophageal reflux disease without esophagitis: Secondary | ICD-10-CM

## 2021-06-28 DIAGNOSIS — K449 Diaphragmatic hernia without obstruction or gangrene: Secondary | ICD-10-CM | POA: Insufficient documentation

## 2021-06-28 DIAGNOSIS — Z9889 Other specified postprocedural states: Secondary | ICD-10-CM

## 2021-06-28 LAB — MAGNESIUM: Magnesium: 1.8 mg/dL (ref 1.7–2.4)

## 2021-06-28 LAB — PHOSPHORUS: Phosphorus: 3.5 mg/dL (ref 2.5–4.6)

## 2021-06-28 LAB — COMPREHENSIVE METABOLIC PANEL
ALT: 28 U/L (ref 0–44)
AST: 28 U/L (ref 15–41)
Albumin: 4.1 g/dL (ref 3.5–5.0)
Alkaline Phosphatase: 57 U/L (ref 38–126)
Anion gap: 8 (ref 5–15)
BUN: 8 mg/dL (ref 6–20)
CO2: 26 mmol/L (ref 22–32)
Calcium: 9.1 mg/dL (ref 8.9–10.3)
Chloride: 103 mmol/L (ref 98–111)
Creatinine, Ser: 0.72 mg/dL (ref 0.44–1.00)
GFR, Estimated: >60 mL/min (ref >60)
Glucose, Bld: 119 mg/dL — ABNORMAL HIGH (ref 70–99)
Potassium: 3.1 mmol/L — ABNORMAL LOW (ref 3.5–5.1)
Sodium: 137 mmol/L (ref 135–145)
Total Bilirubin: 0.9 mg/dL (ref 0.3–1.2)
Total Protein: 6.4 g/dL — ABNORMAL LOW (ref 6.5–8.1)

## 2021-06-28 LAB — PREALBUMIN: Prealbumin: 19.8 mg/dL (ref 18–38)

## 2021-06-28 LAB — TRIGLYCERIDES: Triglycerides: 128 mg/dL (ref <150)

## 2021-06-28 MED ORDER — LIDOCAINE HCL 1 % IJ SOLN
INTRAMUSCULAR | Status: AC
  Filled 2021-06-28: qty 20

## 2021-06-28 MED ORDER — HEPARIN SOD (PORK) LOCK FLUSH 100 UNIT/ML IV SOLN
INTRAVENOUS | Status: AC
  Administered 2021-06-28: 500 [IU]
  Filled 2021-06-28: qty 5

## 2021-06-28 MED ORDER — HEPARIN SOD (PORK) LOCK FLUSH 100 UNIT/ML IV SOLN
INTRAVENOUS | Status: AC
  Filled 2021-06-28: qty 5

## 2021-06-28 MED ORDER — LIDOCAINE HCL (PF) 1 % IJ SOLN
INTRAMUSCULAR | Status: DC | PRN
  Administered 2021-06-28: 10 mL

## 2021-06-28 NOTE — Procedures (Signed)
Successful placement of single lumen PICC line to right basilic vein. Length 39 cm Tip at lower SVC/RA PICC capped No complications Ready for use.  EBL < 5 mL   Ascencion Dike PA-C 06/28/2021 2:02 PM

## 2021-06-29 ENCOUNTER — Emergency Department (HOSPITAL_BASED_OUTPATIENT_CLINIC_OR_DEPARTMENT_OTHER): Payer: BC Managed Care – PPO

## 2021-06-29 ENCOUNTER — Other Ambulatory Visit: Payer: Self-pay | Admitting: Surgery

## 2021-06-29 ENCOUNTER — Emergency Department (HOSPITAL_BASED_OUTPATIENT_CLINIC_OR_DEPARTMENT_OTHER)
Admission: EM | Admit: 2021-06-29 | Discharge: 2021-06-29 | Disposition: A | Discharge status: Home / Self Care | Payer: BC Managed Care – PPO | Attending: Emergency Medicine | Admitting: Emergency Medicine

## 2021-06-29 ENCOUNTER — Other Ambulatory Visit: Payer: Self-pay

## 2021-06-29 ENCOUNTER — Encounter (HOSPITAL_BASED_OUTPATIENT_CLINIC_OR_DEPARTMENT_OTHER): Payer: Self-pay | Admitting: Emergency Medicine

## 2021-06-29 ENCOUNTER — Ambulatory Visit: Payer: Self-pay | Admitting: Surgery

## 2021-06-29 DIAGNOSIS — E876 Hypokalemia: Secondary | ICD-10-CM | POA: Diagnosis present

## 2021-06-29 DIAGNOSIS — E869 Volume depletion, unspecified: Secondary | ICD-10-CM

## 2021-06-29 LAB — CBC
HCT: 32.9 % — ABNORMAL LOW (ref 36.0–46.0)
Hemoglobin: 11.2 g/dL — ABNORMAL LOW (ref 12.0–15.0)
MCH: 29.3 pg (ref 26.0–34.0)
MCHC: 34 g/dL (ref 30.0–36.0)
MCV: 86.1 fL (ref 80.0–100.0)
Platelets: 152 10*3/uL (ref 150–400)
RBC: 3.82 MIL/uL — ABNORMAL LOW (ref 3.87–5.11)
RDW: 12.5 % (ref 11.5–15.5)
WBC: 3.8 10*3/uL — ABNORMAL LOW (ref 4.0–10.5)
nRBC: 0 % (ref 0.0–0.2)

## 2021-06-29 LAB — COMPREHENSIVE METABOLIC PANEL
ALT: 24 U/L (ref 0–44)
AST: 24 U/L (ref 15–41)
Albumin: 4.2 g/dL (ref 3.5–5.0)
Alkaline Phosphatase: 54 U/L (ref 38–126)
Anion gap: 10 (ref 5–15)
BUN: 9 mg/dL (ref 6–20)
CO2: 27 mmol/L (ref 22–32)
Calcium: 9.4 mg/dL (ref 8.9–10.3)
Chloride: 101 mmol/L (ref 98–111)
Creatinine, Ser: 0.68 mg/dL (ref 0.44–1.00)
GFR, Estimated: >60 mL/min (ref >60)
Glucose, Bld: 106 mg/dL — ABNORMAL HIGH (ref 70–99)
Potassium: 3.1 mmol/L — ABNORMAL LOW (ref 3.5–5.1)
Sodium: 138 mmol/L (ref 135–145)
Total Bilirubin: 0.8 mg/dL (ref 0.3–1.2)
Total Protein: 6.3 g/dL — ABNORMAL LOW (ref 6.5–8.1)

## 2021-06-29 LAB — MAGNESIUM: Magnesium: 1.9 mg/dL (ref 1.7–2.4)

## 2021-06-29 LAB — LIPASE, BLOOD: Lipase: <10 U/L — ABNORMAL LOW (ref 11–51)

## 2021-06-29 MED ORDER — KCL IN DEXTROSE-NACL 20-5-0.45 MEQ/L-%-% IV SOLN: INTRAVENOUS | Status: DC

## 2021-06-29 MED ORDER — FOLIC ACID 5 MG/ML IJ SOLN: 1.0000 mg | Freq: Every day | INTRAMUSCULAR | Status: DC

## 2021-06-29 MED ORDER — VITAMIN B-1 50 MG PO TABS: 100.0000 mg | ORAL_TABLET | Freq: Every day | ORAL | Status: DC

## 2021-06-29 MED ORDER — ONDANSETRON HCL 4 MG/2ML IJ SOLN
4.0000 mg | Freq: Once | INTRAMUSCULAR | Status: AC
  Administered 2021-06-29: 4 mg via INTRAVENOUS
  Filled 2021-06-29: qty 2

## 2021-06-29 MED ORDER — THIAMINE HCL 100 MG/ML IJ SOLN: 100.0000 mg | Freq: Every day | INTRAMUSCULAR | Status: DC

## 2021-06-29 MED ORDER — POTASSIUM CHLORIDE 10 MEQ/100ML IV SOLN
10.0000 meq | INTRAVENOUS | Status: AC
  Administered 2021-06-29 (×4): 10 meq via INTRAVENOUS
  Filled 2021-06-29 (×4): qty 100

## 2021-06-29 MED ORDER — FOLIC ACID 0.5 MG HALF TAB: 1.0000 mg | ORAL_TABLET | Freq: Every day | ORAL | Status: DC

## 2021-06-29 NOTE — Discharge Instructions (Signed)
Please follow-up with your surgeon regarding emergency department visit as well as potassium replacement today.  Follow-up for your TPN administration tomorrow.

## 2021-06-29 NOTE — ED Notes (Signed)
Provider at the Bedside. 

## 2021-06-29 NOTE — ED Triage Notes (Signed)
Patient presents to ED via POV from home. Patient was called and instructed to come to ED for low potassium. Patient reports her potassium is 3.1. Endorses nausea and vomiting. PICC line to right arm for TPN but patient has been unable to get feedings due to electrolytes imbalances.

## 2021-06-29 NOTE — ED Provider Notes (Signed)
[facility] EMERGENCY DEPT Provider Note   CSN: [id number] Arrival date & time: 06/29/21  1100     History  Chief Complaint  Patient presents with   Abnormal Lab    Joan Robinson is a 61 y.o. female.   Abnormal Lab  61 year old female presents emergency department with complaints of low potassium.  She was sent by her general surgeon for potassium repletion.  She recently had hiatal hernia surgery on 05/01/2021.  Since the surgery, she has had persistent nausea/vomiting.  She has tried liquid diet but is currently increasingly difficult for her to keep liquids down.  She states that she has been only able to take Synthroid at home has not taken any of her other other medications due to their larger pill size.  She is due to start TPN tomorrow outpatient, but they would not start the TPN until her potassium was within normal limits.  She currently denies nausea/vomiting/abdominal pain, fever, chills, night sweats, chest pain, shortness of breath from baseline, cough, urinary symptoms, change in bowel habits.  She feels overall lethargic and decrease in energy secondary to poor p.o. intake.  She has a repeat surgery scheduled for 07/12/2021 is due to be on TPN until then.  She has a PICC line placed in her right upper extremity.  Home Medications Prior to Admission medications   Medication Sig Start Date End Date Taking? Authorizing Provider  5-Hydroxytryptophan (5-HTP) 100 MG CAPS Take 100 mg by mouth daily.    [provider]  albuterol (PROVENTIL HFA;VENTOLIN HFA) 108 (90 Base) MCG/ACT inhaler Inhale 2 puffs into the lungs every 6 (six) hours as needed for wheezing or shortness of breath. 05/19/15   Saguier, Percell Miller, PA-C  Cholecalciferol (VITAMIN D3) 2000 UNITS TABS Take 2,000 Units by mouth daily.    [provider]  Coenzyme Q10 (CO Q-10) 200 MG CAPS Take 200 mg by mouth daily.    [provider]  conjugated estrogens (PREMARIN) vaginal cream  Place 1 Applicatorful vaginally every other day. Patient taking differently: Place 1 Applicatorful vaginally 2 (two) times a week. 09/02/15   Mosie Lukes, MD  HYDROcodone-acetaminophen (NORCO) 10-325 MG tablet TAKE ONE TABLET BY MOUTH THREE TIMES DAILY AS NEEDED 11/08/17   Mosie Lukes, MD  HYDROcodone-acetaminophen (NORCO/VICODIN) 5-325 MG tablet Take 1 tablet by mouth every 6 (six) hours as needed for moderate pain. 05/03/21   Johnathan Hausen, MD  latanoprost (XALATAN) 0.005 % ophthalmic solution Place 1 drop into both eyes at bedtime. 02/06/16   [provider]  levothyroxine (SYNTHROID, LEVOTHROID) 100 MCG tablet Take 1 tablet (100 mcg total) by mouth daily before breakfast. 05/31/16   Mosie Lukes, MD  lisdexamfetamine (VYVANSE) 70 MG capsule Take 70 mg by mouth daily.    [provider]  magnesium gluconate (MAGONATE) 500 MG tablet Take 500 mg by mouth daily.    [provider]  metoprolol tartrate (LOPRESSOR) 25 MG tablet TAKE 1/2 TABLET BY MOUTH DAILY 10/14/18   Mosie Lukes, MD  nitroGLYCERIN (NITROSTAT) 0.4 MG SL tablet Place 0.4 mg under the tongue every 5 (five) minutes as needed for chest pain.    [provider]  ondansetron (ZOFRAN) 4 MG tablet Take 1 tablet (4 mg total) by mouth every 8 (eight) hours as needed for nausea or vomiting. Take 1-2 tablets every 4-6 hours as needed for nausea 05/03/21   Johnathan Hausen, MD  pantoprazole (PROTONIX) 40 MG tablet Take 1 tablet (40 mg total) by mouth  2 (two) times daily before a meal. 04/05/21   Irene Shipper, MD  Probiotic Product (PROBIOTIC-10 PO) Take 1 tablet by mouth daily.    [provider]  sucralfate (CARAFATE) 1 GM/10ML suspension TAKE 61m BY MOUTH FOUR TIMES DAILY 04/05/21   PIrene Shipper MD  topiramate (TOPAMAX) 25 MG tablet Take 25 mg by mouth 2 (two) times daily.    [provider]  zinc gluconate 50 MG tablet Take 1 tablet (50 mg total) by mouth daily. Patient not  taking: Reported on 04/11/2021 07/27/16   BMosie Lukes MD      Allergies    Aspirin, Nsaids, Keflex [cephalexin], Butamben-tetracaine-benzocaine, Dicyclomine hcl, Metronidazole, Phenergan [promethazine hcl], Quetiapine, Sulfamethoxazole-trimethoprim, Xiidra [lifitegrast], Cymbalta [duloxetine hcl], Levothyroxine, Moxifloxacin, Moxifloxacin hcl in nacl, and Orphenadrine citrate    Review of Systems   Review of Systems  Constitutional:  Positive for appetite change and fatigue. Negative for chills and fever.  HENT:  Negative for ear pain and sore throat.   Eyes:  Negative for pain and visual disturbance.  Respiratory:  Negative for cough and shortness of breath.   Cardiovascular:  Negative for chest pain and palpitations.  Gastrointestinal:  Positive for vomiting. Negative for abdominal pain, diarrhea and nausea.  Genitourinary:  Negative for dysuria and hematuria.  Musculoskeletal:  Negative for arthralgias and back pain.  Skin:  Negative for color change and rash.  Neurological:  Negative for seizures and syncope.  All other systems reviewed and are negative.   Physical Exam Updated Vital Signs BP 126/67   Pulse 66   Temp 98.2 F (36.8 C) (Axillary)   Resp (!) 26   Ht _0  (1.676 m)   Wt 73.5 kg   SpO2 99%   BMI 26.15 kg/m  Physical Exam Vitals and nursing note reviewed.  Constitutional:      General: She is not in acute distress.    Appearance: Normal appearance. She is well-developed. She is not ill-appearing, toxic-appearing or diaphoretic.  HENT:     Head: Normocephalic and atraumatic.     Mouth/Throat:     Mouth: Mucous membranes are dry.     Pharynx: Oropharynx is clear.  Eyes:     General:        Right eye: No discharge.        Left eye: No discharge.     Extraocular Movements: Extraocular movements intact.     Conjunctiva/sclera: Conjunctivae normal.  Cardiovascular:     Rate and Rhythm: Normal rate and regular rhythm.     Heart sounds: No murmur heard.     Comments: PICC line placed in right upper extremity.  No signs of erythema/induration/fluctuance.  Minimal bleeding noted at insertion of catheter. Pulmonary:     Effort: Pulmonary effort is normal. No respiratory distress.     Breath sounds: Normal breath sounds.  Abdominal:     General: Abdomen is flat.     Palpations: Abdomen is soft.     Tenderness: There is no abdominal tenderness.  Musculoskeletal:        General: No swelling.     Cervical back: Neck supple.     Right lower leg: No edema.     Left lower leg: No edema.  Skin:    General: Skin is warm and dry.     Capillary Refill: Capillary refill takes less than 2 seconds.  Neurological:     General: No focal deficit present.     Mental Status: She is alert and  oriented to person, place, and time.  Psychiatric:        Mood and Affect: Mood normal.        Behavior: Behavior normal.     ED Results / Procedures / Treatments   Labs (all labs ordered are listed, but only abnormal results are displayed) Labs Reviewed  LIPASE, BLOOD - Abnormal; Notable for the following components:      Result Value   Lipase <10 (*)    All other components within normal limits  COMPREHENSIVE METABOLIC PANEL - Abnormal; Notable for the following components:   Potassium 3.1 (*)    Glucose, Bld 106 (*)    Total Protein 6.3 (*)    All other components within normal limits  CBC - Abnormal; Notable for the following components:   WBC 3.8 (*)    RBC 3.82 (*)    Hemoglobin 11.2 (*)    HCT 32.9 (*)    All other components within normal limits  MAGNESIUM    EKG EKG Interpretation  Date/Time:  Thursday June 29 2021 12:28:00 EDT Ventricular Rate:  65 PR Interval:  151 QRS Duration: 98 QT Interval:  416 QTC Calculation: 433 R Axis:   12 Text Interpretation: Sinus rhythm Low voltage, precordial leads Confirmed by Aletta Edouard [phone]) on 06/29/2021 12:36:13 PM  Radiology DG Chest Portable 1 View  Result Date: 06/29/2021 CLINICAL  DATA:  Confirmation of Correct Placement/Location of PICC. EXAM: PORTABLE CHEST 1 VIEW COMPARISON:  December 28, 2020 FINDINGS: There has been interval right arm PICC line insertion with its tip at the lower SVC region. The heart size and mediastinal contours are within normal limits. Both lungs are clear. Large hiatal hernia. The visualized skeletal structures are unremarkable. IMPRESSION: There has been interval insertion of right arm PICC line with its tip at the lower SVC region. Again seen is the large hiatal hernia. Lungs are clear. Electronically Signed   By: Frazier Richards M.D.   On: 06/29/2021 12:19   IR PICC PLACEMENT RIGHT >5 YRS INC IMG GUIDE  Result Date: 06/29/2021 INDICATION: Poor venous access. Request PICC line placement for prolonged TPN and intravenous therapies. EXAM: ULTRASOUND AND FLUOROSCOPIC GUIDED RIGHT UPPER EXTREMITY PICC LINE INSERTION MEDICATIONS: 1% plain lidocaine, 1 mL CONTRAST:  None FLUOROSCOPY TIME:  Twelve seconds Radiation exposure index: (1  MGy) COMPLICATIONS: None immediate. TECHNIQUE: The procedure, risks, benefits, and alternatives were explained to the patient and informed written consent was obtained. A timeout was performed prior to the initiation of the procedure. The right upper extremity was prepped with chlorhexidine in a sterile fashion, and a sterile drape was applied covering the operative field. Maximum barrier sterile technique with sterile gowns and gloves were used for the procedure. A timeout was performed prior to the initiation of the procedure. Local anesthesia was provided with 1% lidocaine. Under direct ultrasound guidance, the basilic vein was accessed with a micropuncture kit after the overlying soft tissues were anesthetized with 1% lidocaine. After the overlying soft tissues were anesthetized, a small venotomy incision was created and a micropuncture kit was utilized to access the right basilic vein. Real-time ultrasound guidance was utilized for  vascular access including the acquisition of a permanent ultrasound image documenting patency of the accessed vessel. A guidewire was advanced to the level of the superior caval-atrial junction for measurement purposes and the PICC line was cut to length. A peel-away sheath was placed and a 39 cm, 5 [place], single lumen was inserted to level of the superior caval-atrial  junction. A post procedure spot fluoroscopic was obtained. The catheter easily aspirated and flushed and was secured in place. A dressing was placed. The patient tolerated the procedure well without immediate post procedural complication. FINDINGS: After catheter placement, the tip lies within the superior cavoatrial junction. The catheter aspirates and flushes normally and is ready for immediate use. IMPRESSION: Successful ultrasound and fluoroscopic guided placement of a right basilic vein approach, 39 cm, 5 French, single lumen PICC with tip at the superior caval-atrial junction. The PICC line is ready for immediate use. Read by: Ascencion Dike PA-C Electronically Signed   By: Aletta Edouard M.D.   On: 06/29/2021 08:03    Procedures Procedures    Medications Ordered in ED Medications  potassium chloride 10 mEq in 100 mL IVPB (0 mEq Intravenous Stopped 06/29/21 1723)  ondansetron (ZOFRAN) injection 4 mg (4 mg Intravenous Given 06/29/21 1620)    ED Course/ Medical Decision Making/ A&P Clinical Course as of 06/29/21 1807  Thu Jun 29, 7529  [id number] 61 year old female sent in from her treatment team for low potassium.  She needed IV repletion as she has been able to take p.o.  She is supposed to be starting TPN tomorrow.  Awaiting surgery for her hiatal hernia in a couple of weeks.  She otherwise has no acute complaints.  Getting IV potassium and likely can discharge afterwards. [MB]  3151 Reevaluation of the patient showed that she was actively vomiting after trying to sip on fluids.  She is not been nauseous or vomited up until this point.   Zofran was given and she was advised to refrain from p.o. intake for now. [CR]    Clinical Course User Index [CR] Wilnette Kales, PA [MB] Hayden Rasmussen, MD                           Medical Decision Making Amount and/or Complexity of Data Reviewed Labs: ordered. Radiology: ordered.  Risk Prescription drug management.   This patient presents to the ED for concern of hypokalemia, this involves an extensive number of treatment options, and is a complaint that carries with it a high risk of complications and morbidity.  The differential diagnosis includes dehydration, sepsis, arrhythmia,   Co morbidities that complicate the patient evaluation  See PMH above.   Additional history obtained:  Additional history obtained from surgical note from 05/01/2021 External records from outside source obtained and reviewed including surgical note as well as office plan from today.   Lab Tests:  I Ordered, and personally interpreted labs.  The pertinent results include: Potassium 3.1, did receive 3.8 hemoglobin 11.2.   Imaging Studies ordered:  I ordered imaging studies including chest x-ray I independently visualized and interpreted imaging which showed PICC line with tip at lower SVC region.  Hiatal hernia noticed.  Lungs are clear. I agree with the radiologist interpretation  Cardiac Monitoring: / EKG:  The patient was maintained on a cardiac monitor.  I personally viewed and interpreted the cardiac monitored which showed an underlying rhythm of: Sinus rhythm   Consultations Obtained:  N/a   Problem List / ED Course / Critical interventions / Medication management  Hypokalemia I ordered medication including  Zofran for nausea  Potassium chloride x4 Reevaluation of the patient after these medicines showed that the patient improved I have reviewed the patients home medicines and have made adjustments as needed   Test / Admission - Considered:  Hypokalemia Vitals  signs within  normal range and stable throughout visit. Laboratory/imaging studies significant for: Hypokalemia.  Patient treated with 4 times potassium chloride 10 mEq in 100 mL.  Repeat BMP offered to patient, but she declined because she wanted to go home.  History patient's potassium is within normal range after amount given.  To be reassessed tomorrow for TPN. Worrisome signs and symptoms were discussed with the patient, and the patient acknowledged understanding to return to the ED if noticed. Patient was stable upon discharge.          Final Clinical Impression(s) / ED Diagnoses Final diagnoses:  Hypokalemia    Rx / DC Orders ED Discharge Orders     None         Wilnette Kales, [place] 06/29/21 1807    Hayden Rasmussen, MD 06/29/21 [phone]

## 2021-06-29 NOTE — H&P (Signed)
Patient is volume depleted and needs fluids.  I have ordered TNA but there are apparently delays in starting this.  This is not optimal.  I tried to add her on the schedule for tomorrow but there is a Comptroller and 2 rooms are closed and there was no assistant for me to perform the surgery.  K noted to 3.1.  Bolus of fluids to be given over 4 hours.    Kaylyn Lim, MD, FACS

## 2021-06-30 NOTE — Progress Notes (Signed)
Sent message, via epic in basket, requesting order in epic from surgeon     06/30/21 0912  Preop Orders  Has preop orders? No  Name of staff/physician contacted for orders(Indicate phone or IB message) M. Hassell Done, MD

## 2021-07-06 NOTE — Patient Instructions (Signed)
DUE TO SPACE LIMITATIONS, ONLY TWO VISITORS  (aged 61 and older) ARE ALLOWED TO COME WITH YOU AND STAY IN THE WAITING ROOM DURING YOUR PRE OP AND PROCEDURE.   **NO VISITORS ARE ALLOWED IN THE SHORT STAY AREA OR RECOVERY ROOM!!**  IF YOU WILL BE ADMITTED INTO THE HOSPITAL YOU ARE ALLOWED ONLY FOUR SUPPORT PEOPLE DURING VISITATION HOURS (7 AM -8PM)   The support person(s) must pass our screening, and use Hand sanitizing gel. Visitors GUEST BADGE MUST BE WORN VISIBLY  One adult visitor may remain with you overnight and MUST be in the room by 8 P.M.   You are not required to quarantine at this time prior to your surgery. However, you must do this: Hand Hygiene often Do NOT share personal items Notify your provider if you are in close contact with someone who has COVID or you develop fever 100.4 or greater, new onset of sneezing, cough, sore throat, shortness of breath or body aches.       Your procedure is scheduled on:  July 12, 2021  Report to [facility] Main Entrance.  Report to admitting at:  06:15 AM  +++++Call this number if you have any questions or problems the morning of surgery [phone]  Do not eat food :After Midnight the night prior to your surgery/procedure.  After Midnight you may have the following liquids until  05:30 AM  DAY OF SURGERY  Clear Liquid Diet Water Black Coffee (sugar ok, NO MILK/CREAM OR CREAMERS)  Tea (sugar ok, NO MILK/CREAM OR CREAMERS) regular and decaf                             Plain Jell-O (NO RED)                                           Fruit ices (not with fruit pulp, NO RED)                                     Popsicles (NO RED)                                                                  Juice: apple, WHITE grape, WHITE cranberry Sports drinks like Gatorade (NO RED) Clear broth(vegetable,chicken,beef)                   The day of surgery:  Drink ONE (1) Pre-Surgery Clear Ensure or G2 at  05:30  AM the morning of  surgery. Drink in one sitting. Do not sip.  This drink was given to you during your hospital  pre-op appointment visit. Nothing else to drink after completing the Pre-Surgery Clear Ensure or G2.   If you have questions, please contact your surgeon's office.   FOLLOW BOWEL PREP AND ANY ADDITIONAL PRE OP INSTRUCTIONS YOU RECEIVED FROM YOUR SURGEON'S OFFICE!!!   Oral Hygiene is also important to reduce your risk of infection.        Remember - BRUSH YOUR TEETH THE MORNING OF SURGERY WITH  YOUR REGULAR TOOTHPASTE  Do NOT smoke after Midnight the night before surgery.  Take ONLY these medicines the morning of surgery with A SIP OF WATER: Synthroid. You may use your Albuterol inhaler if needed.  Bring CPAP mask and tubing day of surgery.                   You may not have any metal on your body including hair pins, jewelry, and body piercing  Do not wear make-up, lotions, powders, perfumes, or deodorant  Do not wear nail polish including gel and S&S, artificial / acrylic nails, or any other type of covering on natural nails including finger and toenails. If you have artificial nails, gel coating, etc., that needs to be removed by a nail salon, Please have this removed prior to surgery. Not doing so may mean that your surgery could be cancelled or delayed if the Surgeon or anesthesia staff feels like they are unable to monitor you safely.   Do not shave 48 hours prior to surgery to avoid nicks in your skin which may contribute to postoperative infections.   Contacts, Hearing Aids, dentures or bridgework may not be worn into surgery.   You may bring a small overnight bag with you on the day of surgery, only pack items that are not valuable .[facility] IS NOT RESPONSIBLE   FOR VALUABLES THAT ARE LOST OR STOLEN.   DO NOT [place]. PHARMACY WILL DISPENSE MEDICATIONS LISTED ON YOUR MEDICATION LIST TO YOU DURING YOUR ADMISSION [place]!   Special  Instructions: Bring a copy of your healthcare power of attorney and living will documents the day of surgery, if you wish to have them scanned into your [facility] Medical Records- EPIC  Please read over the following fact sheets you were given: IF YOU HAVE QUESTIONS ABOUT YOUR PRE-OP INSTRUCTIONS, PLEASE CALL [phone]  ([place])   [facility] - Preparing for Surgery Before surgery, you can play an important role.  Because skin is not sterile, your skin needs to be as free of germs as possible.  You can reduce the number of germs on your skin by washing with CHG (chlorahexidine gluconate) soap before surgery.  CHG is an antiseptic cleaner which kills germs and bonds with the skin to continue killing germs even after washing. Please DO NOT use if you have an allergy to CHG or antibacterial soaps.  If your skin becomes reddened/irritated stop using the CHG and inform your nurse when you arrive at Short Stay. Do not shave (including legs and underarms) for at least 48 hours prior to the first CHG shower.  You may shave your face/neck.  Please follow these instructions carefully:  1.  Shower with CHG Soap the night before surgery and the  morning of surgery.  2.  If you choose to wash your hair, wash your hair first as usual with your normal  shampoo.  3.  After you shampoo, rinse your hair and body thoroughly to remove the shampoo.                             4.  Use CHG as you would any other liquid soap.  You can apply chg directly to the skin and wash.  Gently with a scrungie or clean washcloth.  5.  Apply the CHG Soap to your body ONLY FROM THE NECK DOWN.   Do not use on face/  open                           Wound or open sores. Avoid contact with eyes, ears mouth and genitals (private parts).                       Wash face,  Genitals (private parts) with your normal soap.             6.  Wash thoroughly, paying special attention to the area where your  surgery  will be performed.  7.  Thoroughly  rinse your body with warm water from the neck down.  8.  DO NOT shower/wash with your normal soap after using and rinsing off the CHG Soap.            9.  Pat yourself dry with a clean towel.            10.  Wear clean pajamas.            11.  Place clean sheets on your bed the night of your first shower and do not  sleep with pets.  ON THE DAY OF SURGERY : Do not apply any lotions/deodorants the morning of surgery.  Please wear clean clothes to the hospital/surgery center.    FAILURE TO FOLLOW THESE INSTRUCTIONS MAY RESULT IN THE CANCELLATION OF YOUR SURGERY  PATIENT SIGNATURE_________________________________  NURSE SIGNATURE__________________________________  ________________________________________________________________________

## 2021-07-06 NOTE — Progress Notes (Addendum)
COVID Vaccine received:  []  No [x]  Yes   X1  Date of any COVID positive Test in last 90 days: NO  PCP - Kathlen Brunswick, NP  Cardiologist -  Orpah Cobb, MD  Chest x-ray - 06-29-21   (1 View)  EPIC EKG -  06-29-21  EPIC Stress Test - 2010 Epic  ECHO - > 2 years Cardiac Cath - many years ago at [facility]?  Pacemaker/ICD device last checked: Date:       [x]  N/A Spinal Cord Stimulator:[x]  No []  Yes   Other Implants:   Patient has a PICC line for TPN in Right arm   Bowel Prep - none per patient, No MD orders available at PST  History of Sleep Apnea? []  No [x]  Yes  []  unknown Sleep Study Date:   CPAP used?- [x]  No []  Yes  (Instruct to bring their mask & Tubing)  Does the patient monitor blood sugar? []  No []  Yes  [x]  N/A Does patient have a [employer] or Dexacom? [x]  No []  Yes   Fasting Blood Sugar Ranges-  Checks Blood Sugar _____ times a day  Blood Thinner Instructions:  none Aspirin Instructions: Last Dose:  Activity level:  Can go up a flight of stairs and perform activities of daily living without stopping and without symptoms of chest pain or shortness of breath.[x]  No []  Yes  []  N/A  Able to do exercises without symptoms []  No []  Yes  []  N/A  Patient unable to complete ADLs without assistance []  No []  Yes  [x]  N/A    Comments:   Anesthesia review:   Patient denies shortness of breath, fever, cough and chest pain at PAT appointment  Patient verbalized understanding and agreement to the Pre-Surgical Instructions that were given to them at this PAT appointment. Patient was also educated of the need to review these PAT instructions again prior to his/her surgery.I reviewed the appropriate phone numbers to call if they have any and questions or concerns.

## 2021-07-07 ENCOUNTER — Other Ambulatory Visit: Payer: Self-pay

## 2021-07-07 ENCOUNTER — Encounter (HOSPITAL_COMMUNITY)
Admission: RE | Admit: 2021-07-07 | Discharge: 2021-07-07 | Disposition: A | Discharge status: Home / Self Care | Payer: BC Managed Care – PPO | Source: Ambulatory Visit | Attending: Surgery | Admitting: Surgery

## 2021-07-07 ENCOUNTER — Encounter (HOSPITAL_COMMUNITY): Payer: Self-pay

## 2021-07-07 DIAGNOSIS — Z01818 Encounter for other preprocedural examination: Secondary | ICD-10-CM | POA: Insufficient documentation

## 2021-07-07 NOTE — H&P (Signed)
Chief Complaint:  recurrent hiatal hernia after robotic repair of hiatus and Nissen fundoplication  History of Present Illness:  Joan Robinson is an 61 y.o. female who comes in for a recurrent hiatal hernia with partial obstruction.  She underwent a robotic hiatal hernia repair 05/01/21.  She has been on home tNA since 06/30/2021.  Plan laparoscopic intervention.   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   BCC (basal cell carcinoma of skin) 06/01/2012   Leg Follows with Dr Margo Aye   Bipolar disorder (HCC) 01/17/2007   Qualifier: Diagnosis of  By: Everardo All MD, Gregary Signs A    Cataract    Chicken pox as a child   X 2   Colitis, Clostridium difficile 5/11,6/11   Colon polyp    benign   Diverticulosis    Diverticulosis    Emphysema of lung (HCC)    Family history of adverse reaction to anesthesia    mother very hard to wake up   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   Gallstones    GERD (gastroesophageal reflux disease)    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   Hashimoto's disease    Headache(784.0) 08/17/2012   migraines   Heart murmur    History of hiatal hernia    Hyperlipidemia    Hypertension    Hypothyroidism    Hypothyroidism 08/24/2006   Qualifier: Diagnosis of  By: Everardo All MD, Sean A     IBS (irritable bowel syndrome) 07/27/2016   Lower back pain    Macular pucker, bilateral 10/10/2011   Mumps as a child   Obesity 11/01/2013   Perimenopausal 01/16/2012   Pneumonia    Preventative health care 10/10/2011   PUD (peptic ulcer disease)    Right knee pain 05/10/2012   Sinusitis acute 10/10/2011   Sleep apnea 04/27/2016   Staph aureus infection 12/18/2011   Recurrent lesions in nares   Tobacco abuse     Urinary incontinence     Past Surgical History:  Procedure Laterality Date   ABDOMINAL HYSTERECTOMY     ABDOMINAL HYSTERECTOMY  01/15/2009   complete   COLONOSCOPY     DILATION AND CURETTAGE OF UTERUS  01/16/1983   EYE SURGERY  03/03/2014   Surgery on both eyes for epiretinal membrane (vitreous peel)   Gated Spect wall motion stress cardiolite  11/05/2001   POLYPECTOMY     TUBAL LIGATION  01/15/1993   UTERINE SUSPENSION     mesh   VITRECTOMY Bilateral 03/03/2014   XI ROBOTIC ASSISTED HIATAL HERNIA REPAIR N/A 05/01/2021   Procedure: XI ROBOTIC ASSISTED TYPE III HIATAL HERNIA REPAIR WITH FUNDOPLICATION;  Surgeon: Luretha Murphy, MD;  Location: [facility];  Service: General;  Laterality: N/A;    No current facility-administered medications for this encounter.   Current Outpatient Medications  Medication Sig Dispense Refill   5-Hydroxytryptophan (5-HTP) 100 MG CAPS Take 100 mg by mouth daily.     albuterol (PROVENTIL HFA;VENTOLIN HFA) 108 (90 Base) MCG/ACT inhaler Inhale 2 puffs into the lungs every 6 (six) hours as needed for wheezing or shortness of breath. 1 Inhaler 0   Cholecalciferol (VITAMIN D3) 2000 UNITS TABS Take 2,000 Units by mouth daily.     Coenzyme  Q10 (CO Q-10) 200 MG CAPS Take 200 mg by mouth daily.     HYDROcodone-acetaminophen (NORCO) 10-325 MG tablet TAKE ONE TABLET BY MOUTH THREE TIMES DAILY AS NEEDED 70 tablet 0   HYDROcodone-acetaminophen (NORCO/VICODIN) 5-325 MG tablet Take 1 tablet by mouth every 6 (six) hours as needed for moderate pain. 15 tablet 0   latanoprost (XALATAN) 0.005 % ophthalmic solution Place 1 drop into both eyes at bedtime.     levothyroxine (SYNTHROID, LEVOTHROID) 100 MCG tablet Take 1 tablet (100 mcg total) by mouth daily before breakfast. 30 tablet 6   lisdexamfetamine (VYVANSE) 70 MG capsule Take 70 mg by mouth daily.     magnesium gluconate (MAGONATE) 500 MG tablet Take 500 mg by mouth daily.     metoprolol tartrate (LOPRESSOR) 25 MG  tablet TAKE 1/2 TABLET BY MOUTH DAILY 45 tablet 1   nitroGLYCERIN (NITROSTAT) 0.4 MG SL tablet Place 0.4 mg under the tongue every 5 (five) minutes as needed for chest pain.     ondansetron (ZOFRAN) 4 MG tablet Take 1 tablet (4 mg total) by mouth every 8 (eight) hours as needed for nausea or vomiting. Take 1-2 tablets every 4-6 hours as needed for nausea 30 tablet 2   pantoprazole (PROTONIX) 40 MG tablet Take 1 tablet (40 mg total) by mouth 2 (two) times daily before a meal. 180 tablet 3   Probiotic Product (PROBIOTIC-10 PO) Take 1 tablet by mouth daily.     sucralfate (CARAFATE) 1 GM/10ML suspension TAKE 10ml BY MOUTH FOUR TIMES DAILY 3600 mL 3   topiramate (TOPAMAX) 25 MG tablet Take 25 mg by mouth 2 (two) times daily.     conjugated estrogens (PREMARIN) vaginal cream Place 1 Applicatorful vaginally every other day. (Patient not taking: Reported on 06/30/2021) 30 g 5   zinc gluconate 50 MG tablet Take 1 tablet (50 mg total) by mouth daily. (Patient not taking: Reported on 04/11/2021) 30 tablet 2   Facility-Administered Medications Ordered in Other Encounters  Medication Dose Route Frequency Provider Last Rate Last Admin   dextrose 5 % and 0.45 % NaCl with KCl 20 mEq/L infusion   Intravenous Continuous Luretha Murphy, MD       folic acid (FOLVITE) tablet 1 mg  1 mg Oral Daily Luretha Murphy, MD       Or   folic acid injection 1 mg  1 mg Intravenous Daily Luretha Murphy, MD       thiamine (VITAMIN B-1) tablet 100 mg  100 mg Oral Daily Luretha Murphy, MD       Or   thiamine (B-1) injection 100 mg  100 mg Intravenous Daily Luretha Murphy, MD       Aspirin, Nsaids, Keflex [cephalexin], Butamben-tetracaine-benzocaine, Dicyclomine hcl, Metronidazole, Phenergan [promethazine hcl], Quetiapine, Xiidra [lifitegrast], Cymbalta [duloxetine hcl], Levothyroxine, Moxifloxacin, Moxifloxacin hcl in nacl, Orphenadrine citrate, and Sulfamethoxazole-trimethoprim Family History  Problem Relation Age of Onset    Heart disease Mother        stents   Stroke Mother    Hyperlipidemia Mother    Hypertension Mother    Other Mother        blood disorder- mgus   Colon polyps Father    Other Father        aorta disection   Aneurysm Father    Irritable bowel syndrome Daughter    Other Daughter        gastritis   Mental illness Daughter        bipolar and mood disorder  Hypertension Sister        ?   Mental illness Son        bipolar   Arthritis Sister    Other Sister        thyroid   Other Sister        thyroid   Colon cancer Paternal Uncle    Social History:   reports that she quit smoking about 12 years ago. Her smoking use included cigarettes. She has a 30.00 pack-year smoking history. She has never used smokeless tobacco. She reports that she does not drink alcohol and does not use drugs.   REVIEW OF SYSTEMS : Negative except for see review of systems  Physical Exam:   There were no vitals taken for this visit. There is no height or weight on file to calculate BMI.  Gen:  WDWN WF NAD  Neurological: Alert and oriented to person, place, and time. Motor and sensory function is grossly intact  Head: Normocephalic and atraumatic.  Eyes: Conjunctivae are normal. Pupils are equal, round, and reactive to light. No scleral icterus.  Neck: Normal range of motion. Neck supple. No tracheal deviation or thyromegaly present.  Cardiovascular:  SR without murmurs or gallops.  No carotid bruits Breast:  not examined; Respiratory: Effort normal.  No respiratory distress. No chest wall tenderness. Breath sounds normal.  No wheezes, rales or rhonchi.  Abdomen:  nontender GU:  not examined Musculoskeletal: Normal range of motion. Extremities are nontender. No cyanosis, edema or clubbing noted Lymphadenopathy: No cervical, preauricular, postauricular or axillary adenopathy is present Skin: Skin is warm and dry. No rash noted. No diaphoresis. No erythema. No pallor. Pscyh: Normal mood and affect.  Behavior is normal. Judgment and thought content normal.   LABORATORY RESULTS: No results found for this or any previous visit (from the past 48 hour(s)).   RADIOLOGY RESULTS: No results found.  Problem List: Patient Active Problem List   Diagnosis Date Noted   Status post laparoscopic Nissen fundoplication 05/01/2021   IBS (irritable bowel syndrome) 07/27/2016   High-tone pelvic floor dysfunction 05/09/2016   Midline cystocele 05/09/2016   Vaginal atrophy 05/09/2016   Bladder prolapse, female, acquired 05/07/2016   Dystrophic nail 04/29/2016   Fatigue 04/27/2016   Sleep apnea 04/27/2016   UTI (urinary tract infection) 09/11/2015   Cataract cortical, senile, left 12/20/2014   Rectal lesion 10/10/2014   Other type of migraine without status migrainosus 05/06/2014   Dyspepsia 05/06/2014   Postmenopausal estrogen deficiency 05/06/2014   Screening for breast cancer 05/06/2014   Bilateral low-tension glaucoma, indeterminate stage 12/29/2013   High myopia, bilateral 12/29/2013   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   Obesity 11/01/2013   Eustachian tube dysfunction 10/08/2013   Otalgia of left ear 10/08/2013   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   Arthritis 07/24/2013   Anxiety attack 03/08/2013   Paresthesia 12/22/2012   Headache(784.0) 08/17/2012   BCC (basal cell carcinoma of skin) 06/01/2012   Left arm pain 05/10/2012   Right knee pain 05/10/2012   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   ADD (attention deficit disorder) 04/13/2012   Perimenopausal 01/16/2012   Macular pucker of both eyes 10/18/2011   Vitreous degeneration 10/18/2011   Anemia 10/10/2011   Preventative health care 10/10/2011   Urticaria, chronic 10/10/2011   Macular pucker, bilateral 10/10/2011   Lower back pain    DIVERTICULITIS-COLON 07/25/2009   Diverticulitis of colon 07/25/2009   Essential hypertension 06/09/2009   HELICOBACTER PYLORI GASTRITIS, HX OF  09/06/2008   GERD 07/27/2008   Bipolar disorder (HCC) 01/17/2007   Hypothyroidism 08/24/2006   Allergic rhinitis 08/24/2006   URINARY INCONTINENCE 08/24/2006    Assessment & Plan: 9 weeks post Nissen and hiatal hernia with early recurrence-for lap repair and probable gastroplexy    Matt B. Daphine Deutscher, MD, [facility] [phone] beeper [phone]  07/07/2021 5:04 PM

## 2021-07-12 ENCOUNTER — Ambulatory Visit (HOSPITAL_COMMUNITY): Payer: BC Managed Care – PPO | Admitting: Certified Registered Nurse Anesthetist

## 2021-07-12 ENCOUNTER — Inpatient Hospital Stay (HOSPITAL_COMMUNITY): Payer: BC Managed Care – PPO

## 2021-07-12 ENCOUNTER — Encounter (HOSPITAL_COMMUNITY): Payer: Self-pay | Admitting: Surgery

## 2021-07-12 ENCOUNTER — Encounter (HOSPITAL_COMMUNITY)
Admission: AD | Disposition: A | Discharge status: Home / Self Care | Payer: Self-pay | Source: Ambulatory Visit | Attending: Surgery

## 2021-07-12 ENCOUNTER — Other Ambulatory Visit: Payer: Self-pay

## 2021-07-12 ENCOUNTER — Inpatient Hospital Stay (HOSPITAL_COMMUNITY)
Admission: AD | Admit: 2021-07-12 | Discharge: 2021-07-17 | DRG: 327 | Disposition: A | Discharge status: Home / Self Care | Payer: BC Managed Care – PPO | Source: Ambulatory Visit | Attending: Surgery | Admitting: Surgery

## 2021-07-12 DIAGNOSIS — F319 Bipolar disorder, unspecified: Secondary | ICD-10-CM | POA: Diagnosis present

## 2021-07-12 DIAGNOSIS — K219 Gastro-esophageal reflux disease without esophagitis: Secondary | ICD-10-CM | POA: Diagnosis present

## 2021-07-12 DIAGNOSIS — Z79890 Hormone replacement therapy (postmenopausal): Secondary | ICD-10-CM | POA: Diagnosis not present

## 2021-07-12 DIAGNOSIS — Z8261 Family history of arthritis: Secondary | ICD-10-CM

## 2021-07-12 DIAGNOSIS — Z83438 Family history of other disorder of lipoprotein metabolism and other lipidemia: Secondary | ICD-10-CM | POA: Diagnosis not present

## 2021-07-12 DIAGNOSIS — I10 Essential (primary) hypertension: Secondary | ICD-10-CM | POA: Diagnosis present

## 2021-07-12 DIAGNOSIS — Z8711 Personal history of peptic ulcer disease: Secondary | ICD-10-CM | POA: Diagnosis not present

## 2021-07-12 DIAGNOSIS — K440 Diaphragmatic hernia with obstruction, without gangrene: Principal | ICD-10-CM | POA: Diagnosis present

## 2021-07-12 DIAGNOSIS — Z8249 Family history of ischemic heart disease and other diseases of the circulatory system: Secondary | ICD-10-CM

## 2021-07-12 DIAGNOSIS — J439 Emphysema, unspecified: Secondary | ICD-10-CM | POA: Diagnosis present

## 2021-07-12 DIAGNOSIS — K449 Diaphragmatic hernia without obstruction or gangrene: Secondary | ICD-10-CM | POA: Diagnosis present

## 2021-07-12 DIAGNOSIS — D62 Acute posthemorrhagic anemia: Secondary | ICD-10-CM | POA: Diagnosis not present

## 2021-07-12 DIAGNOSIS — Z87891 Personal history of nicotine dependence: Secondary | ICD-10-CM

## 2021-07-12 DIAGNOSIS — Z79899 Other long term (current) drug therapy: Secondary | ICD-10-CM | POA: Diagnosis not present

## 2021-07-12 DIAGNOSIS — Z823 Family history of stroke: Secondary | ICD-10-CM | POA: Diagnosis not present

## 2021-07-12 DIAGNOSIS — K660 Peritoneal adhesions (postprocedural) (postinfection): Secondary | ICD-10-CM | POA: Diagnosis present

## 2021-07-12 HISTORY — PX: HIATAL HERNIA REPAIR: SHX195

## 2021-07-12 LAB — CBC
HCT: 26.9 % — ABNORMAL LOW (ref 36.0–46.0)
Hemoglobin: 8.9 g/dL — ABNORMAL LOW (ref 12.0–15.0)
MCH: 30.2 pg (ref 26.0–34.0)
MCHC: 33.1 g/dL (ref 30.0–36.0)
MCV: 91.2 fL (ref 80.0–100.0)
Platelets: 108 10*3/uL — ABNORMAL LOW (ref 150–400)
RBC: 2.95 MIL/uL — ABNORMAL LOW (ref 3.87–5.11)
RDW: 12.7 % (ref 11.5–15.5)
WBC: 4.6 10*3/uL (ref 4.0–10.5)
nRBC: 0 % (ref 0.0–0.2)

## 2021-07-12 LAB — CREATININE, SERUM
Creatinine, Ser: 0.68 mg/dL (ref 0.44–1.00)
GFR, Estimated: >60 mL/min (ref >60)

## 2021-07-12 SURGERY — REPAIR, HERNIA, HIATAL, LAPAROSCOPIC
Anesthesia: General

## 2021-07-12 MED ORDER — KETAMINE HCL 10 MG/ML IJ SOLN
INTRAMUSCULAR | Status: DC | PRN
  Administered 2021-07-12: 20 mg via INTRAVENOUS
  Administered 2021-07-12: 30 mg via INTRAVENOUS

## 2021-07-12 MED ORDER — DEXAMETHASONE SODIUM PHOSPHATE 4 MG/ML IJ SOLN
INTRAMUSCULAR | Status: DC | PRN
  Administered 2021-07-12: 5 mg via INTRAVENOUS

## 2021-07-12 MED ORDER — BUPIVACAINE LIPOSOME 1.3 % IJ SUSP: 20.0000 mL | Freq: Once | INTRAMUSCULAR | Status: DC

## 2021-07-12 MED ORDER — LACTATED RINGERS IV SOLN: INTRAVENOUS | Status: DC | PRN

## 2021-07-12 MED ORDER — ALBUTEROL SULFATE (2.5 MG/3ML) 0.083% IN NEBU
2.5000 mg | INHALATION_SOLUTION | Freq: Four times a day (QID) | RESPIRATORY_TRACT | Status: DC | PRN
Start: 2021-07-12 — End: 2021-07-17

## 2021-07-12 MED ORDER — NITROGLYCERIN 0.4 MG SL SUBL: 0.4000 mg | SUBLINGUAL_TABLET | SUBLINGUAL | Status: DC | PRN

## 2021-07-12 MED ORDER — ONDANSETRON HCL 4 MG/2ML IJ SOLN
INTRAMUSCULAR | Status: AC
  Filled 2021-07-12: qty 2

## 2021-07-12 MED ORDER — CEFOTETAN DISODIUM 2 G IJ SOLR
2.0000 g | INTRAMUSCULAR | Status: AC
  Administered 2021-07-12: 2 g via INTRAVENOUS
  Filled 2021-07-12: qty 2

## 2021-07-12 MED ORDER — ONDANSETRON HCL 4 MG/2ML IJ SOLN
4.0000 mg | Freq: Four times a day (QID) | INTRAMUSCULAR | Status: DC | PRN
  Administered 2021-07-16: 4 mg via INTRAVENOUS
  Filled 2021-07-12: qty 2

## 2021-07-12 MED ORDER — MEPERIDINE HCL 50 MG/ML IJ SOLN: 6.2500 mg | INTRAMUSCULAR | Status: DC | PRN

## 2021-07-12 MED ORDER — ONDANSETRON HCL 4 MG/2ML IJ SOLN
INTRAMUSCULAR | Status: DC | PRN
  Administered 2021-07-12: 4 mg via INTRAVENOUS

## 2021-07-12 MED ORDER — HEPARIN SODIUM (PORCINE) 5000 UNIT/ML IJ SOLN
5000.0000 [IU] | Freq: Three times a day (TID) | INTRAMUSCULAR | Status: DC
  Administered 2021-07-12 – 2021-07-13 (×2): 5000 [IU] via SUBCUTANEOUS
  Filled 2021-07-12 (×2): qty 1

## 2021-07-12 MED ORDER — PROPOFOL 500 MG/50ML IV EMUL
INTRAVENOUS | Status: DC | PRN
  Administered 2021-07-12: 25 ug/kg/min via INTRAVENOUS

## 2021-07-12 MED ORDER — ALBUTEROL SULFATE HFA 108 (90 BASE) MCG/ACT IN AERS
2.0000 | INHALATION_SPRAY | Freq: Four times a day (QID) | RESPIRATORY_TRACT | Status: DC | PRN
Start: 2021-07-12 — End: 2021-07-12

## 2021-07-12 MED ORDER — LIDOCAINE HCL 2 % IJ SOLN
INTRAMUSCULAR | Status: AC
  Filled 2021-07-12: qty 20

## 2021-07-12 MED ORDER — LIDOCAINE 2% (20 MG/ML) 5 ML SYRINGE
INTRAMUSCULAR | Status: DC | PRN
  Administered 2021-07-12: 60 mg via INTRAVENOUS

## 2021-07-12 MED ORDER — FENTANYL CITRATE PF 50 MCG/ML IJ SOSY
12.5000 ug | PREFILLED_SYRINGE | INTRAMUSCULAR | Status: DC | PRN
  Administered 2021-07-13 – 2021-07-14 (×12): 12.5 ug via INTRAVENOUS
  Filled 2021-07-12 (×12): qty 1

## 2021-07-12 MED ORDER — CHLORHEXIDINE GLUCONATE CLOTH 2 % EX PADS: 6.0000 | MEDICATED_PAD | Freq: Once | CUTANEOUS | Status: DC

## 2021-07-12 MED ORDER — PHENYLEPHRINE 80 MCG/ML (10ML) SYRINGE FOR IV PUSH (FOR BLOOD PRESSURE SUPPORT)
PREFILLED_SYRINGE | INTRAVENOUS | Status: DC | PRN
  Administered 2021-07-12 (×5): 80 ug via INTRAVENOUS

## 2021-07-12 MED ORDER — ORAL CARE MOUTH RINSE: 15.0000 mL | Freq: Once | OROMUCOSAL | Status: AC

## 2021-07-12 MED ORDER — SUCCINYLCHOLINE CHLORIDE 200 MG/10ML IV SOSY
PREFILLED_SYRINGE | INTRAVENOUS | Status: DC | PRN
  Administered 2021-07-12: 120 mg via INTRAVENOUS

## 2021-07-12 MED ORDER — FENTANYL CITRATE (PF) 250 MCG/5ML IJ SOLN
INTRAMUSCULAR | Status: AC
  Filled 2021-07-12: qty 5

## 2021-07-12 MED ORDER — ALBUMIN HUMAN 5 % IV SOLN
INTRAVENOUS | Status: AC
  Filled 2021-07-12: qty 250

## 2021-07-12 MED ORDER — LATANOPROST 0.005 % OP SOLN
1.0000 [drp] | Freq: Every day | OPHTHALMIC | Status: DC
Start: 2021-07-12 — End: 2021-07-17
  Administered 2021-07-13 – 2021-07-16 (×5): 1 [drp] via OPHTHALMIC
  Filled 2021-07-12: qty 2.5

## 2021-07-12 MED ORDER — ONDANSETRON 4 MG PO TBDP: 4.0000 mg | ORAL_TABLET | Freq: Four times a day (QID) | ORAL | Status: DC | PRN

## 2021-07-12 MED ORDER — HYDROMORPHONE HCL 1 MG/ML IJ SOLN
0.2500 mg | INTRAMUSCULAR | Status: DC | PRN
  Administered 2021-07-12: 0.25 mg via INTRAVENOUS
  Administered 2021-07-12: 0.5 mg via INTRAVENOUS

## 2021-07-12 MED ORDER — ONDANSETRON HCL 4 MG/2ML IJ SOLN: 4.0000 mg | Freq: Once | INTRAMUSCULAR | Status: DC | PRN

## 2021-07-12 MED ORDER — KCL IN DEXTROSE-NACL 20-5-0.45 MEQ/L-%-% IV SOLN
INTRAVENOUS | Status: AC
  Filled 2021-07-12 (×3): qty 1000

## 2021-07-12 MED ORDER — SODIUM CHLORIDE (PF) 0.9 % IJ SOLN
INTRAMUSCULAR | Status: DC | PRN
  Administered 2021-07-12: 10 mL

## 2021-07-12 MED ORDER — METOPROLOL TARTRATE 5 MG/5ML IV SOLN: 5.0000 mg | Freq: Four times a day (QID) | INTRAVENOUS | Status: DC | PRN

## 2021-07-12 MED ORDER — ROCURONIUM BROMIDE 10 MG/ML (PF) SYRINGE
PREFILLED_SYRINGE | INTRAVENOUS | Status: AC
  Filled 2021-07-12: qty 10

## 2021-07-12 MED ORDER — MIDAZOLAM HCL 2 MG/2ML IJ SOLN
INTRAMUSCULAR | Status: AC
  Filled 2021-07-12: qty 2

## 2021-07-12 MED ORDER — PROPOFOL 10 MG/ML IV BOLUS
INTRAVENOUS | Status: DC | PRN
  Administered 2021-07-12: 200 mg via INTRAVENOUS

## 2021-07-12 MED ORDER — CHLORHEXIDINE GLUCONATE 0.12 % MT SOLN
15.0000 mL | Freq: Once | OROMUCOSAL | Status: AC
  Administered 2021-07-12: 15 mL via OROMUCOSAL

## 2021-07-12 MED ORDER — SUGAMMADEX SODIUM 200 MG/2ML IV SOLN
INTRAVENOUS | Status: DC | PRN
  Administered 2021-07-12: 200 mg via INTRAVENOUS

## 2021-07-12 MED ORDER — PROPOFOL 500 MG/50ML IV EMUL
INTRAVENOUS | Status: AC
  Filled 2021-07-12: qty 50

## 2021-07-12 MED ORDER — KETAMINE HCL 50 MG/5ML IJ SOSY
PREFILLED_SYRINGE | INTRAMUSCULAR | Status: AC
  Filled 2021-07-12: qty 5

## 2021-07-12 MED ORDER — LIDOCAINE HCL (PF) 2 % IJ SOLN
INTRAMUSCULAR | Status: DC | PRN
  Administered 2021-07-12: 1.5 mg/kg/h via INTRADERMAL

## 2021-07-12 MED ORDER — BUPIVACAINE LIPOSOME 1.3 % IJ SUSP
INTRAMUSCULAR | Status: AC
  Filled 2021-07-12: qty 20

## 2021-07-12 MED ORDER — LACTATED RINGERS IV SOLN: INTRAVENOUS | Status: DC

## 2021-07-12 MED ORDER — ROCURONIUM BROMIDE 10 MG/ML (PF) SYRINGE
PREFILLED_SYRINGE | INTRAVENOUS | Status: DC | PRN
  Administered 2021-07-12: 100 mg via INTRAVENOUS
  Administered 2021-07-12 (×2): 20 mg via INTRAVENOUS

## 2021-07-12 MED ORDER — SODIUM CHLORIDE 0.9 % IV SOLN
2.0000 g | Freq: Two times a day (BID) | INTRAVENOUS | Status: AC
  Administered 2021-07-12: 2 g via INTRAVENOUS
  Filled 2021-07-12 (×2): qty 2

## 2021-07-12 MED ORDER — SCOPOLAMINE 1 MG/3DAYS TD PT72
1.0000 | MEDICATED_PATCH | TRANSDERMAL | Status: DC
  Administered 2021-07-12: 1.5 mg via TRANSDERMAL
  Filled 2021-07-12: qty 1

## 2021-07-12 MED ORDER — LACTATED RINGERS IR SOLN
Status: DC | PRN
  Administered 2021-07-12: 1000 mL

## 2021-07-12 MED ORDER — SODIUM CHLORIDE (PF) 0.9 % IJ SOLN
INTRAMUSCULAR | Status: AC
  Filled 2021-07-12: qty 20

## 2021-07-12 MED ORDER — BUPIVACAINE LIPOSOME 1.3 % IJ SUSP
INTRAMUSCULAR | Status: DC | PRN
  Administered 2021-07-12: 20 mL

## 2021-07-12 MED ORDER — MIDAZOLAM HCL 5 MG/5ML IJ SOLN
INTRAMUSCULAR | Status: DC | PRN
  Administered 2021-07-12: 2 mg via INTRAVENOUS

## 2021-07-12 MED ORDER — 0.9 % SODIUM CHLORIDE (POUR BTL) OPTIME
TOPICAL | Status: DC | PRN
  Administered 2021-07-12: 1000 mL

## 2021-07-12 MED ORDER — HYDROMORPHONE HCL 1 MG/ML IJ SOLN
INTRAMUSCULAR | Status: AC
  Administered 2021-07-12: 0.25 mg via INTRAVENOUS
  Filled 2021-07-12: qty 1

## 2021-07-12 MED ORDER — FENTANYL CITRATE (PF) 100 MCG/2ML IJ SOLN
INTRAMUSCULAR | Status: DC | PRN
  Administered 2021-07-12: 100 ug via INTRAVENOUS
  Administered 2021-07-12 (×5): 50 ug via INTRAVENOUS

## 2021-07-12 MED ORDER — DEXAMETHASONE SODIUM PHOSPHATE 10 MG/ML IJ SOLN
INTRAMUSCULAR | Status: AC
  Filled 2021-07-12: qty 1

## 2021-07-12 MED ORDER — FENTANYL CITRATE (PF) 100 MCG/2ML IJ SOLN
INTRAMUSCULAR | Status: AC
  Filled 2021-07-12: qty 2

## 2021-07-12 MED ORDER — PHENYLEPHRINE 80 MCG/ML (10ML) SYRINGE FOR IV PUSH (FOR BLOOD PRESSURE SUPPORT)
PREFILLED_SYRINGE | INTRAVENOUS | Status: AC
  Filled 2021-07-12: qty 10

## 2021-07-12 MED ORDER — ALBUMIN HUMAN 5 % IV SOLN: INTRAVENOUS | Status: DC | PRN

## 2021-07-12 MED ORDER — SUCCINYLCHOLINE CHLORIDE 200 MG/10ML IV SOSY
PREFILLED_SYRINGE | INTRAVENOUS | Status: AC
  Filled 2021-07-12: qty 10

## 2021-07-12 MED ORDER — PANTOPRAZOLE SODIUM 40 MG IV SOLR
40.0000 mg | Freq: Every day | INTRAVENOUS | Status: DC
  Administered 2021-07-12 – 2021-07-16 (×5): 40 mg via INTRAVENOUS
  Filled 2021-07-12 (×4): qty 10

## 2021-07-12 SURGICAL SUPPLY — 65 items
APPLIER CLIP ROT 10 11.4 M/L (STAPLE)
BAG COUNTER SPONGE SURGICOUNT (BAG) IMPLANT
BLADE SURG 15 STRL LF DISP TIS (BLADE) ×1 IMPLANT
BLADE SURG 15 STRL SS (BLADE) ×2
CABLE HIGH FREQUENCY MONO STRZ (ELECTRODE) IMPLANT
CATH BOLUS GASTRO 22FR (CATHETERS) ×1 IMPLANT
CLAMP ENDO BABCK 10MM (STAPLE) ×2 IMPLANT
CLIP APPLIE ROT 10 11.4 M/L (STAPLE) IMPLANT
COVER SURGICAL LIGHT HANDLE (MISCELLANEOUS) ×2 IMPLANT
DERMABOND ADVANCED (GAUZE/BANDAGES/DRESSINGS) ×1
DERMABOND ADVANCED .7 DNX12 (GAUZE/BANDAGES/DRESSINGS) ×1 IMPLANT
DEVICE SUT QUICK LOAD TK 5 (SUTURE) ×3 IMPLANT
DEVICE SUT TI-KNOT TK 5X26 (SUTURE) ×1 IMPLANT
DEVICE SUTURE ENDOST 10MM (ENDOMECHANICALS) ×2 IMPLANT
DISSECTOR BLUNT TIP ENDO 5MM (MISCELLANEOUS) ×3 IMPLANT
DRAIN CHANNEL 19F RND (DRAIN) ×1 IMPLANT
DRAIN PENROSE 0.5X18 (DRAIN) ×2 IMPLANT
ELECT L-HOOK LAP 45CM DISP (ELECTROSURGICAL)
ELECT REM PT RETURN 15FT ADLT (MISCELLANEOUS) ×2 IMPLANT
ELECTRODE L-HOOK LAP 45CM DISP (ELECTROSURGICAL) IMPLANT
EVACUATOR SILICONE 100CC (DRAIN) ×1 IMPLANT
GAUZE 4X4 16PLY ~~LOC~~+RFID DBL (SPONGE) ×2 IMPLANT
GLOVE SURG LX 8.0 MICRO (GLOVE) ×1
GLOVE SURG LX STRL 8.0 MICRO (GLOVE) ×1 IMPLANT
GOWN STRL REUS W/ TWL XL LVL3 (GOWN DISPOSABLE) ×3 IMPLANT
GOWN STRL REUS W/TWL XL LVL3 (GOWN DISPOSABLE) ×6
GRASPER ENDO BABCOCK 10 (MISCELLANEOUS) IMPLANT
GRASPER ENDO BABCOCK 10MM (MISCELLANEOUS)
GRASPER SUT TROCAR 14GX15 (MISCELLANEOUS) ×1 IMPLANT
IRRIG SUCT STRYKERFLOW 2 WTIP (MISCELLANEOUS) ×2
IRRIGATION SUCT STRKRFLW 2 WTP (MISCELLANEOUS) ×1 IMPLANT
KIT BASIN OR (CUSTOM PROCEDURE TRAY) ×2 IMPLANT
KIT TURNOVER KIT A (KITS) IMPLANT
NEEDLE HYPO 22GX1.5 SAFETY (NEEDLE) ×2 IMPLANT
PACK CARDIOVASCULAR III (CUSTOM PROCEDURE TRAY) ×2 IMPLANT
RELOAD ENDO STITCH 2.0 (ENDOMECHANICALS) ×8
RELOAD SUT SNGL STCH BLK 2-0 (ENDOMECHANICALS) IMPLANT
SCISSORS LAP 5X45 EPIX DISP (ENDOMECHANICALS) ×2 IMPLANT
SET TUBE SMOKE EVAC HIGH FLOW (TUBING) ×2 IMPLANT
SHEARS HARMONIC ACE PLUS 45CM (MISCELLANEOUS) ×2 IMPLANT
SLEEVE ADV FIXATION 12X100MM (TROCAR) ×1 IMPLANT
SLEEVE ADV FIXATION 5X100MM (TROCAR) ×5 IMPLANT
SPIKE FLUID TRANSFER (MISCELLANEOUS) ×2 IMPLANT
STAPLER VISISTAT 35W (STAPLE) ×2 IMPLANT
SUT ETHILON 2 0 PS N (SUTURE) ×1 IMPLANT
SUT MNCRL AB 4-0 PS2 18 (SUTURE) IMPLANT
SUT RELOAD ENDO STITCH 2.0 (ENDOMECHANICALS) ×4
SUT SURGIDAC NAB ES-9 0 48 120 (SUTURE) ×6 IMPLANT
SUT VIC AB 4-0 SH 18 (SUTURE) IMPLANT
SUTURE RELOAD ENDO STITCH 2.0 (ENDOMECHANICALS) ×4 IMPLANT
SYR 20ML LL LF (SYRINGE) ×2 IMPLANT
TAPE CLOTH 4X10 WHT NS (GAUZE/BANDAGES/DRESSINGS) IMPLANT
TIP INNERVISION DETACH 40FR (MISCELLANEOUS) IMPLANT
TIP INNERVISION DETACH 50FR (MISCELLANEOUS) IMPLANT
TIP INNERVISION DETACH 56FR (MISCELLANEOUS) IMPLANT
TIPS INNERVISION DETACH 40FR (MISCELLANEOUS)
TOWEL OR 17X26 10 PK STRL BLUE (TOWEL DISPOSABLE) ×2 IMPLANT
TOWEL OR NON WOVEN STRL DISP B (DISPOSABLE) ×2 IMPLANT
TRAY FOLEY MTR SLVR 16FR STAT (SET/KITS/TRAYS/PACK) IMPLANT
TRAY LAPAROSCOPIC (CUSTOM PROCEDURE TRAY) ×2 IMPLANT
TROCAR 11X100 Z THREAD (TROCAR) ×2 IMPLANT
TROCAR ADV FIXATION 12X100MM (TROCAR) ×1 IMPLANT
TROCAR ADV FIXATION 5X100MM (TROCAR) ×2 IMPLANT
TROCAR Z-THREAD OPTICAL 5X100M (TROCAR) ×2 IMPLANT
TUBING ENDO SMARTCAP (MISCELLANEOUS) ×1 IMPLANT

## 2021-07-12 NOTE — Anesthesia Preprocedure Evaluation (Signed)
Anesthesia Evaluation  Patient identified by MRN, date of birth, ID band Patient awake    Reviewed: Allergy & Precautions, NPO status , Patient's Chart, lab work & pertinent test results, reviewed documented beta blocker date and time   Airway Mallampati: III  TM Distance: >3 FB Neck ROM: Full    Dental  (+) Missing,  A few missing teeth, none loose per patient :   Pulmonary sleep apnea , COPD,  COPD inhaler, former smoker,    Pulmonary exam normal breath sounds clear to auscultation       Cardiovascular hypertension, Pt. on home beta blockers Normal cardiovascular exam Rhythm:Regular Rate:Normal     Neuro/Psych  Headaches, PSYCHIATRIC DISORDERS Anxiety Depression Bipolar Disorder  Neuromuscular disease    GI/Hepatic Neg liver ROS, hiatal hernia, PUD, GERD  Medicated,  Endo/Other  Hypothyroidism Morbid obesity  Renal/GU negative Renal ROS  negative genitourinary   Musculoskeletal  (+) Arthritis ,   Abdominal Normal abdominal exam  (+)   Peds negative pediatric ROS (+)  Hematology  (+) Blood dyscrasia, anemia ,   Anesthesia Other Findings Autoimmune urticaria Multiple eye problems   Reproductive/Obstetrics negative OB ROS                             Anesthesia Physical  Anesthesia Plan  ASA: 3  Anesthesia Plan: General   Post-op Pain Management: Ofirmev IV (intra-op)*   Induction: Intravenous  PONV Risk Score and Plan: 3 and Scopolamine patch - Pre-op, Treatment may vary due to age or medical condition, Ondansetron, Dexamethasone and Midazolam  Airway Management Planned: Oral ETT  Additional Equipment: None  Intra-op Plan:   Post-operative Plan: Extubation in OR  Informed Consent: I have reviewed the patients History and Physical, chart, labs and discussed the procedure including the risks, benefits and alternatives for the proposed anesthesia with the patient or authorized  representative who has indicated his/her understanding and acceptance.     Dental advisory given  Plan Discussed with: CRNA  Anesthesia Plan Comments:         Anesthesia Quick Evaluation

## 2021-07-12 NOTE — Progress Notes (Signed)
Pharmacy Brief Note - TPN:  TPN consult received after the noon deadline, patient noted to be on home TPN since 06/30/21 per MD note.  Will work on obtaining home formula and plan to resume on 6/29 @ 1800 per protocol.  Will use D51/2NS with 72mq KCl at 125 ml/hr until then.   Will place consult to RD and order labs for tomorrow morning.   EPeggyann Juba PharmD, BCPS Pharmacy: [phone]/28/2023 2:06 PM

## 2021-07-12 NOTE — Anesthesia Procedure Notes (Signed)
Procedure Name: Intubation Date/Time: 07/12/2021 7:40 AM  Performed by: Claudia Desanctis, CRNAPre-anesthesia Checklist: Patient identified, Emergency Drugs available, Suction available and Patient being monitored Patient Re-evaluated:Patient Re-evaluated prior to induction Oxygen Delivery Method: Circle system utilized Preoxygenation: Pre-oxygenation with 100% oxygen Induction Type: IV induction, Rapid sequence and Cricoid Pressure applied Laryngoscope Size: 2 and Miller Grade View: Grade I Tube type: Oral Tube size: 7.0 mm Number of attempts: 1 Airway Equipment and Method: Stylet Placement Confirmation: ETT inserted through vocal cords under direct vision, positive ETCO2 and breath sounds checked- equal and bilateral Secured at: 21 cm Tube secured with: Tape Dental Injury: Teeth and Oropharynx as per pre-operative assessment

## 2021-07-12 NOTE — Transfer of Care (Addendum)
Immediate Anesthesia Transfer of Care Note  Patient: Joan Robinson  Procedure(s) Performed: LAPAROSCOPIC REPAIR OF RECURRENT HIATAL HERNIA  Patient Location: PACU  Anesthesia Type:General  Level of Consciousness: drowsy  Airway & Oxygen Therapy: Patient Spontanous Breathing and Patient connected to face mask  Post-op Assessment: Report given to RN and Post -op Vital signs reviewed and stable  Post vital signs: Reviewed and stable    Last Vitals:  Vitals Value Taken Time  BP 125/76 07/12/21 1324  Temp    Pulse 79 07/12/21 1329  Resp 15 07/12/21 1329  SpO2 95 % 07/12/21 1329  Vitals shown include unvalidated device data.  Last Pain:  Vitals:   07/12/21 0646  TempSrc:   PainSc: 4       Patients Stated Pain Goal: 3 (50/38/88 2800)  Complications: No notable events documented.

## 2021-07-12 NOTE — Progress Notes (Signed)
Dr.Hatchett notified of patients continuous TPN infusing through rt arm piccline. He states to leave in place and continue infusing.

## 2021-07-12 NOTE — Op Note (Addendum)
Joan Robinson  1960-07-20   07/12/2021    PCP:  Jettie Booze, NP   Surgeon: Kaylyn Lim, MD, FACS  Asst:  Louanna Raw, MD, Michaelle Birks, MD  Anes:  general  Preop Dx: Recurrent hiatal hernia with obstruction Postop Dx: same  Procedure: Laparoscopy, upper endoscopy, extensive foregut dissection of the stomach above the chest taking down the herniated stomach principally in the left chest--5 hours Location Surgery: [place] room 1 Complications: Unable to completely mobilize the stomach from the chest  EBL:   30 cc  Drains: 19 Blake drain through the left upper quadrant that goes near the dissection and on into the mediastinum  Description of Procedure:  The patient was taken to OR 1.  After anesthesia was administered and the patient was prepped  with ChloraPrep and a timeout was performed.  Access to the abdomen was achieved with a 5 mm Optiview through the left upper quadrant.  Once the initial trocars were placed by put a Depo of Exparel in the right and left upper quadrants creating a tap type block.  Nathanson retractor was placed in the upper midline to retract the liver.  Patient has a rather long J-shaped stomach and a an anterior visible hiatal hernia.  The posterior crural sutures held but the anterior hiatus was spread apart.   We began taking down the adhesions along the right side and found them to be very chronically inflamed and stuck.  The planes were obscured by the amount of scar tissue.  I began working over on the left side and found the stomach was stuck high in the mediastinum ultimately involving probably the left pleura.  There was some thick rind in that area and this was released as much as we can get it taken down.  She had an endoscope in place and we checked her on multiple times and had to look for ability to get into the distal stomach and also to make sure there is no evidence of leak or perforation.   Wrap itself with the had the sutures  divided and this was allowed to fall back to about a 270 wrap.  However stomach appeared to have herniated above the wrap and twisted and was densely adherent to the mediastinal structures.  I was able to pass the scope from the the esophagus into the proximal pouch and then into the stomach.  We completed the procedure by placing a G-tube in the distal fundus antrum region and securing it to the anterior abdominal wall with this 2-0 silk.  Also did a pursestring of silk around the the 22 [place] G-tube.  Pexy was done using the PMI and silk as well.  A 19 Blake drain was placed in crossing into the mediastinum and placed coming out the left upper quadrant.  Patient was noted to have the same level arcuate linear pouches on both sides of her midline there were quite prominent and unusual.  The G tube was clamped.  The wounds were closed with 4-0 monocryl and Dermabond.      The patient tolerated the procedure well and was taken to the PACU in stable condition.     Matt B. Hassell Done, [place], [facility], [place]

## 2021-07-12 NOTE — Interval H&P Note (Signed)
History and Physical Interval Note:  07/12/2021 7:16 AM  Joan Robinson  has presented today for surgery, with the diagnosis of recurrent hiatal hernia.  The various methods of treatment have been discussed with the patient and family. After consideration of risks, benefits and other options for treatment, the patient has consented to  Procedure(s): [place] (N/A) as a surgical intervention.  The patient's history has been reviewed, patient examined, no change in status, stable for surgery.  I have reviewed the patient's chart and labs.  Questions were answered to the patient's satisfaction.     [name]

## 2021-07-12 NOTE — Anesthesia Postprocedure Evaluation (Signed)
Anesthesia Post Note  Patient: Joan Robinson  Procedure(s) Performed: LAPAROSCOPY W/ EXTENSIVE FOREGUT DISSECTION; PARTIAL STOMACH REDUCTION; GASTROSTOMY TUBE PLACEMENT; GASTROPEXY     Patient location during evaluation: PACU Anesthesia Type: General Level of consciousness: awake and sedated Pain management: pain level controlled Vital Signs Assessment: post-procedure vital signs reviewed and stable Respiratory status: spontaneous breathing Cardiovascular status: stable Postop Assessment: no apparent nausea or vomiting Anesthetic complications: no   No notable events documented.  Last Vitals:  Vitals:   07/12/21 1430 07/12/21 1500  BP: 130/70 123/65  Pulse: 95 94  Resp: 16 17  Temp:  36.6 C  SpO2: 95% 92%    Last Pain:  Vitals:   07/12/21 1500  TempSrc:   PainSc: 0-No pain                 John F Salome Arnt

## 2021-07-13 ENCOUNTER — Encounter (HOSPITAL_COMMUNITY): Payer: Self-pay | Admitting: Surgery

## 2021-07-13 ENCOUNTER — Inpatient Hospital Stay (HOSPITAL_COMMUNITY): Payer: BC Managed Care – PPO

## 2021-07-13 LAB — CBC
HCT: 25.7 % — ABNORMAL LOW (ref 36.0–46.0)
Hemoglobin: 8.5 g/dL — ABNORMAL LOW (ref 12.0–15.0)
MCH: 29.9 pg (ref 26.0–34.0)
MCHC: 33.1 g/dL (ref 30.0–36.0)
MCV: 90.5 fL (ref 80.0–100.0)
Platelets: 104 10*3/uL — ABNORMAL LOW (ref 150–400)
RBC: 2.84 MIL/uL — ABNORMAL LOW (ref 3.87–5.11)
RDW: 12.8 % (ref 11.5–15.5)
WBC: 5.6 10*3/uL (ref 4.0–10.5)
nRBC: 0 % (ref 0.0–0.2)

## 2021-07-13 LAB — COMPREHENSIVE METABOLIC PANEL
ALT: 84 U/L — ABNORMAL HIGH (ref 0–44)
AST: 85 U/L — ABNORMAL HIGH (ref 15–41)
Albumin: 2.8 g/dL — ABNORMAL LOW (ref 3.5–5.0)
Alkaline Phosphatase: 58 U/L (ref 38–126)
Anion gap: 7 (ref 5–15)
BUN: 15 mg/dL (ref 6–20)
CO2: 23 mmol/L (ref 22–32)
Calcium: 8.5 mg/dL — ABNORMAL LOW (ref 8.9–10.3)
Chloride: 110 mmol/L (ref 98–111)
Creatinine, Ser: 0.81 mg/dL (ref 0.44–1.00)
GFR, Estimated: >60 mL/min (ref >60)
Glucose, Bld: 136 mg/dL — ABNORMAL HIGH (ref 70–99)
Potassium: 4.2 mmol/L (ref 3.5–5.1)
Sodium: 140 mmol/L (ref 135–145)
Total Bilirubin: 0.6 mg/dL (ref 0.3–1.2)
Total Protein: 5 g/dL — ABNORMAL LOW (ref 6.5–8.1)

## 2021-07-13 LAB — GLUCOSE, CAPILLARY: Glucose-Capillary: 174 mg/dL — ABNORMAL HIGH (ref 70–99)

## 2021-07-13 LAB — PHOSPHORUS: Phosphorus: 3.7 mg/dL (ref 2.5–4.6)

## 2021-07-13 LAB — MAGNESIUM: Magnesium: 1.8 mg/dL (ref 1.7–2.4)

## 2021-07-13 MED ORDER — PHENYLEPHRINE HCL-NACL 20-0.9 MG/250ML-% IV SOLN
INTRAVENOUS | Status: AC
  Filled 2021-07-13: qty 500

## 2021-07-13 MED ORDER — CHLORHEXIDINE GLUCONATE CLOTH 2 % EX PADS
6.0000 | MEDICATED_PAD | Freq: Every day | CUTANEOUS | Status: DC
  Administered 2021-07-14 – 2021-07-17 (×4): 6 via TOPICAL

## 2021-07-13 MED ORDER — DEXTROSE-NACL 5-0.45 % IV SOLN
INTRAVENOUS | Status: DC
Start: 2021-07-13 — End: 2021-07-14

## 2021-07-13 MED ORDER — IOHEXOL 300 MG/ML  SOLN
100.0000 mL | Freq: Once | INTRAMUSCULAR | Status: AC | PRN
Start: 2021-07-13 — End: 2021-07-13
  Administered 2021-07-13: 100 mL via ORAL

## 2021-07-13 MED ORDER — INSULIN ASPART 100 UNIT/ML IJ SOLN
0.0000 [IU] | Freq: Four times a day (QID) | INTRAMUSCULAR | Status: DC
  Administered 2021-07-14 (×3): 1 [IU] via SUBCUTANEOUS
  Administered 2021-07-14: 2 [IU] via SUBCUTANEOUS
  Administered 2021-07-15 – 2021-07-17 (×3): 1 [IU] via SUBCUTANEOUS

## 2021-07-13 MED ORDER — TRAVASOL 10 % IV SOLN
INTRAVENOUS | Status: AC
  Filled 2021-07-13: qty 1094.4

## 2021-07-13 NOTE — Progress Notes (Signed)
PHARMACY - TOTAL PARENTERAL NUTRITION CONSULT NOTE   Indication:  bowel rest s/p Nissen and hiatal hernia repair. Inability to take PO  Patient Measurements: Height: '5\' 6"'$  (167.6 cm) Weight: 75 kg (165 lb 5.5 oz) IBW/kg (Calculated) : 59.3 TPN AdjBW (KG): 63.2 Body mass index is 26.69 kg/m.  Assessment:  61 yo F presented ~9 weeks post Nissen and hiatal hernia repair with a recurrent hernia with obstruction. Also with dysphagia and esopagitis. Has been on TPN since 6/16.  Glucose / Insulin: No hx of DM. Electrolytes: wnl Renal: SCr wnl Hepatic: LFTs slightly elevated. Tbili ok. Albumin 2.5 Intake / Output; MIVF: Drain output 341m. Good UOP.  GI Imaging: Xray shows stable hiatal hernia GI Surgeries / Procedures: Lap with foregut dissection, partial stomach reduction, gastropexy, and gastrostomy tube placement.  Central access: PICC TPN start date: PTA 6/16 >>  Nutritional Goals: Goal TPN rate is 80 mL/hr (provides 109 g of protein and 1,890 kcals per day)  PTA TPN goal: 870mhr (provides 110g of protein and 1,857 kcal per day)  RD Assessment:    Current Nutrition:  NPO and TPN  Plan:  Continue TPN at 8080mr at 1800 Electrolytes in TPN: Na 51m58m, K 40mE60m Ca 5mEq/58mMg 5mEq/L76mnd Phos 15mmol/29ml:Ac 1:1 Add standard MVI and trace elements to TPN Initiate Sensitive q6h SSI and adjust as needed  Remove KCl from fluids and reduce D51/2NS to 40 mL/hr at 1800 Monitor TPN labs on Mon/Thurs, check Bmet tomorrow F/U RD recs  Makaveli Hoard BElenor Quinones, BCPS, BCIDP Clinical Pharmacist 07/13/2021 8:36 AM

## 2021-07-13 NOTE — Progress Notes (Signed)
Patient ID: Joan Robinson, female   DOB: [birth year], 61 y.o.   MRN: [id number] [facility] Progress Note:   1 Day Post-Op  Subjective: Mental status is alert and questions answered.  Complaints sore and feels pulling at G tube . Objective: Vital signs in last 24 hours: Temp:  [97.9 F (36.6 C)-99.3 F (37.4 C)] 98.9 F (37.2 C) (06/29 1009) Pulse Rate:  [78-99] 78 (06/29 1009) Resp:  [14-24] 16 (06/29 1009) BP: (104-135)/(51-89) 115/71 (06/29 1009) SpO2:  [91 %-100 %] 100 % (06/29 1009)  Intake/Output from previous day: 06/28 0701 - 06/29 0700 In: 4333 [I.V.:3733; IV Piggyback:600] Out: 4200 [Urine:3790; Drains:310; Blood:100] Intake/Output this shift: Total I/O In: 0  Out: 320 [Urine:200; Drains:120]  Physical Exam: Work of breathing is not labored.  G tube in place.  JP with serosanguinous fluid  Lab Results:  Results for orders placed or performed during the hospital encounter of 07/12/21 (from the past 48 hour(s))  CBC     Status: Abnormal   Collection Time: 07/12/21  6:28 PM  Result Value Ref Range   WBC 4.6 4.0 - 10.5 K/uL   RBC 2.95 (L) 3.87 - 5.11 MIL/uL   Hemoglobin 8.9 (L) 12.0 - 15.0 g/dL   HCT 08.6 (L) 57.8 - 46.9 %   MCV 91.2 80.0 - 100.0 fL   MCH 30.2 26.0 - 34.0 pg   MCHC 33.1 30.0 - 36.0 g/dL   RDW 62.9 52.8 - 41.3 %   Platelets 108 (L) 150 - 400 K/uL   nRBC 0.0 0.0 - 0.2 %    Comment: Performed at [facility], [address] [place], [place] [address]  Creatinine, serum     Status: None   Collection Time: 07/12/21  6:28 PM  Result Value Ref Range   Creatinine, Ser 0.68 0.44 - 1.00 mg/dL   GFR, Estimated >02 >72 mL/min    Comment: (NOTE) Calculated using the CKD-EPI Creatinine Equation (2021) Performed at [facility], [address] [place], [place] [address]   CBC     Status: Abnormal   Collection Time: 07/13/21  4:16 AM  Result Value Ref Range   WBC 5.6 4.0 - 10.5 K/uL   RBC 2.84 (L) 3.87 -  5.11 MIL/uL   Hemoglobin 8.5 (L) 12.0 - 15.0 g/dL   HCT 40.3 (L) 47.4 - 25.9 %   MCV 90.5 80.0 - 100.0 fL   MCH 29.9 26.0 - 34.0 pg   MCHC 33.1 30.0 - 36.0 g/dL   RDW 56.3 87.5 - 64.3 %   Platelets 104 (L) 150 - 400 K/uL   nRBC 0.0 0.0 - 0.2 %    Comment: Performed at [facility], [address] [place], [place] [address]  Comprehensive metabolic panel     Status: Abnormal   Collection Time: 07/13/21  4:16 AM  Result Value Ref Range   Sodium 140 135 - 145 mmol/L   Potassium 4.2 3.5 - 5.1 mmol/L   Chloride 110 98 - 111 mmol/L   CO2 23 22 - 32 mmol/L   Glucose, Bld 136 (H) 70 - 99 mg/dL    Comment: Glucose reference range applies only to samples taken after fasting for at least 8 hours.   BUN 15 6 - 20 mg/dL   Creatinine, Ser 8.84 0.44 - 1.00 mg/dL   Calcium 8.5 (L) 8.9 - 10.3 mg/dL   Total Protein 5.0 (L) 6.5 - 8.1 g/dL   Albumin 2.8 (L)  3.5 - 5.0 g/dL   AST 85 (H) 15 - 41 U/L   ALT 84 (H) 0 - 44 U/L   Alkaline Phosphatase 58 38 - 126 U/L   Total Bilirubin 0.6 0.3 - 1.2 mg/dL   GFR, Estimated >16 >10 mL/min    Comment: (NOTE) Calculated using the CKD-EPI Creatinine Equation (2021)    Anion gap 7 5 - 15    Comment: Performed at [facility], [address] [place], [place] [address]  Magnesium     Status: None   Collection Time: 07/13/21  4:16 AM  Result Value Ref Range   Magnesium 1.8 1.7 - 2.4 mg/dL    Comment: Performed at [facility], [address], [place], [place] [address]  Phosphorus     Status: None   Collection Time: 07/13/21  4:16 AM  Result Value Ref Range   Phosphorus 3.7 2.5 - 4.6 mg/dL    Comment: Performed at [facility], [address] [place], [place] [address]    Radiology/Results: DG UGI W SINGLE CM (SOL OR THIN BA)  Result Date: 07/13/2021 CLINICAL DATA:  Status post hiatal hernia repair. EXAM: UPPER GI SERIES WITHOUT KUB TECHNIQUE: A problem oriented water-soluble  upper GI series was performed to assess for leak, obstruction or persistent hiatal hernia status post hiatal hernia repair. FLUOROSCOPY: Fluoroscopy time: 2 minutes, 24 seconds Radiation Exposure Index (as provided by the fluoroscopic device): 53.10 mGy Kerma COMPARISON:  Upper GI series 06/15/2021. CT abdomen/pelvis 06/08/2021. FINDINGS: There was prompt passage of contrast from the esophagus into the stomach. Smoothly narrowed appearance of the distal esophagus, unchanged from the recent prior upper GI series of 06/15/2021. Mild intermittent esophageal dysmotility. Small-volume gastroesophageal reflux was observed to the level of the lower esophagus. Although decreased in size, there is a persistent moderate-to-large hiatal hernia. The gastric cardia, fundus and portions of the gastric body remain displaced within the chest. A gastrostomy tube is now present. Otherwise unremarkable appearance of the stomach, duodenal bulb and duodenal sweep. There was prompt passage of contrast from the stomach into the proximal small bowel. No extraluminal contrast was demonstrated to suggest a leak. IMPRESSION: Although decreased in size from the prior upper GI series of 06/15/2021, there is a persistent moderate-to-large hiatal hernia. The gastric cardia, fundus and portions of the gastric body remain displaced within the chest. Prompt passage of contrast from the esophagus into the stomach, and from the stomach into the proximal small bowel. No evidence of leak. Unchanged smoothly narrowed appearance of the distal esophagus. Correlate with findings on recent laparoscopy and endoscopy. Mild intermittent esophageal dysmotility. Small-volume gastroesophageal reflux observed to the level of the lower esophagus. Electronically Signed   By: Jackey Loge D.O.   On: 07/13/2021 09:59   DG Chest Port 1 View  Result Date: 07/12/2021 CLINICAL DATA:  Post cast placement. EXAM: PORTABLE CHEST 1 VIEW COMPARISON:  Chest x-ray 06/29/2021  FINDINGS: Right upper extremity PICC terminates over the distal SVC, unchanged. There is increasing patchy opacity in the left lung base. Large hiatal hernia again seen. No pleural effusion or pneumothorax. Cardiomediastinal silhouette is within normal limits. No acute fractures are seen. IMPRESSION: 1. New left lower lobe airspace disease. 2. Stable large hiatal hernia. 3. Right upper extremity PICC terminates over the distal SVC. Electronically Signed   By: Darliss Cheney M.D.   On: 07/12/2021 15:15    Anti-infectives: Anti-infectives (From admission, onward)    Start     Dose/Rate  Route Frequency Ordered Stop   07/12/21 2000  cefoTEtan (CEFOTAN) 2 g in sodium chloride 0.9 % 100 mL IVPB        2 g 200 mL/hr over 30 Minutes Intravenous Every 12 hours 07/12/21 1751 07/12/21 2127   07/12/21 0600  cefoTEtan (CEFOTAN) 2 g in sodium chloride 0.9 % 100 mL IVPB        2 g 200 mL/hr over 30 Minutes Intravenous On call to O.R. 07/12/21 0527 07/12/21 0804       Assessment/Plan: Problem List: Patient Active Problem List   Diagnosis Date Noted   Hiatal hernia 07/12/2021   Status post laparoscopic Nissen fundoplication 05/01/2021   IBS (irritable bowel syndrome) 07/27/2016   High-tone pelvic floor dysfunction 05/09/2016   Midline cystocele 05/09/2016   Vaginal atrophy 05/09/2016   Bladder prolapse, female, acquired 05/07/2016   Dystrophic nail 04/29/2016   Fatigue 04/27/2016   Sleep apnea 04/27/2016   UTI (urinary tract infection) 09/11/2015   Cataract cortical, senile, left 12/20/2014   Rectal lesion 10/10/2014   Other type of migraine without status migrainosus 05/06/2014   Dyspepsia 05/06/2014   Postmenopausal estrogen deficiency 05/06/2014   Screening for breast cancer 05/06/2014   Bilateral low-tension glaucoma, indeterminate stage 12/29/2013   High myopia, bilateral 12/29/2013   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   Obesity  11/01/2013   Eustachian tube dysfunction 10/08/2013   Otalgia of left ear 10/08/2013   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   Arthritis 07/24/2013   Anxiety attack 03/08/2013   Paresthesia 12/22/2012   Headache(784.0) 08/17/2012   BCC (basal cell carcinoma of skin) 06/01/2012   Left arm pain 05/10/2012   Right knee pain 05/10/2012   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   ADD (attention deficit disorder) 04/13/2012   Perimenopausal 01/16/2012   Macular pucker of both eyes 10/18/2011   Vitreous degeneration 10/18/2011   Anemia 10/10/2011   Preventative health care 10/10/2011   Urticaria, chronic 10/10/2011   Macular pucker, bilateral 10/10/2011   Lower back pain    DIVERTICULITIS-COLON 07/25/2009   Diverticulitis of colon 07/25/2009   Essential hypertension 06/09/2009   HELICOBACTER PYLORI GASTRITIS, HX OF 09/06/2008   GERD 07/27/2008   Bipolar disorder (HCC) 01/17/2007   Hypothyroidism 08/24/2006   Allergic rhinitis 08/24/2006   URINARY INCONTINENCE 08/24/2006    Hg drop noted.  Will recheck in AM.  TPN to resume today.  UGI examined and will begin clear liquids.  1 Day Post-Op    LOS: 1 day   Matt B. Daphine Deutscher, MD, [facility] [phone] to reach the surgeon on call.    07/13/2021 11:06 AM

## 2021-07-13 NOTE — TOC Progression Note (Signed)
Transition of Care [facility]) - Progression Note    Patient Details  Name: Joan Robinson MRN: [id number] Date of Birth: 1960/10/03  Transition of Care [facility]) CM/SW Contact  Servando Snare, [place] Phone Number: 07/13/2021, 2:08 PM  Clinical Narrative:       Transition of Care (TOC) Screening Note   Patient Details  Name: Joan Robinson Date of Birth: 1960-02-22   Transition of Care [facility]) CM/SW Contact:    Servando Snare, LCSW Phone Number: 07/13/2021, 2:08 PM    Transition of Care Department [facility]) has reviewed patient and no TOC needs have been identified at this time. We will continue to monitor patient advancement through interdisciplinary progression rounds. If new patient transition needs arise, please place a TOC consult.        Expected Discharge Plan and Services                                                 Social Determinants of Health (SDOH) Interventions    Readmission Risk Interventions     No data to display

## 2021-07-13 NOTE — Progress Notes (Signed)
Initial Nutrition Assessment  DOCUMENTATION CODES:   Not applicable  INTERVENTION:  - TPN management per Pharmacist.   NUTRITION DIAGNOSIS:   Inadequate oral intake related to inability to eat as evidenced by NPO status.  GOAL:   Patient will meet greater than or equal to 90% of their needs  MONITOR:   Labs, Weight trends, I & O's, Other (Comment) (TPN regimen)  REASON FOR ASSESSMENT:   Consult New TPN/TNA  ASSESSMENT:   61 y.o. female with medical history of diverticulosis, hypothyroidism, PUD, HTN, HLD, anemia, anxiety, depression, Freiberg's disease, basal cell carcinoma of the skin, bipolar disorder, arthritis, sleep apnea, IBW, GERD, emphysema, Hashimoto's disease. She underwent robotic hiatal hernia repair and has been on home TPN since 06/30/21. She returned to the hospital due to recurrent hiatal hernia with partial obstruction and on 6/28 underwent laparoscopy, upper endoscopy, extensive foregut dissection of the stomach.  Patient sitting up in the chair. She is POD #1. Single lumen PICC in R basilic from 8/65/78.   Patient confirms TPN start on 6/16 and that [facility] was managing TPN at home. She initially was on 24 hrs/day but recently changed to 18 hrs/day to allow time to be disconnected.  Order in place for TPN at 80 ml/hr x24 hours to provide 1890 kcal and 110 grams protein.  Patient shares that since TPN start she was only able to take sips of liquids. She was drinking water but then began to feel this was causing acid relux-like experience so she switched to sipping only on Coke. No other PO intakes.  She shared that there is ongoing discussion as of today about plan concerning POs moving forward. Will monitor for this and provide any necessary interventions related, if needed.  A 22 F G-tube was placed during surgery yesterday; currently clamped.  Weight yesterday was documented as 165 lb and weight on 3/31 was 193 lb. This indicates 28 lb weight loss (14.5%  body weight) in the past 3 months; significant for time frame.  High risk for malnutrition although unable to identify malnutrition based on current findings and information.    Labs reviewed; Ca: 8.5 mg/dl, LFTs elevated.  Medications reviewed; sliding scale novolog, 40 mg IV protonix/day.   IVF; D5-1/2 NS to decrease from 125 ml/hr to 40 ml/hr today at 1800 with TPN start.     NUTRITION - FOCUSED PHYSICAL EXAM:  Flowsheet Row Most Recent Value  Orbital Region No depletion  Upper Arm Region No depletion  Thoracic and Lumbar Region Unable to assess  Buccal Region No depletion  Temple Region No depletion  Clavicle Bone Region No depletion  Clavicle and Acromion Bone Region No depletion  Scapular Bone Region No depletion  Dorsal Hand No depletion  Patellar Region No depletion  Anterior Thigh Region No depletion  Posterior Calf Region No depletion  Edema (RD Assessment) None  Hair Reviewed  Eyes Reviewed  Mouth Reviewed  Skin Reviewed  Nails Reviewed       Diet Order:   Diet Order             Diet NPO time specified  Diet effective now                   EDUCATION NEEDS:   No education needs have been identified at this time  Skin:  Skin Assessment: Skin Integrity Issues: Skin Integrity Issues:: Incisions Incisions: abdomen (6/28)  Last BM:  PTA/unknown  Height:   Ht Readings from Last 1 Encounters:  07/12/21 5'  6" (1.676 m)    Weight:   Wt Readings from Last 1 Encounters:  07/12/21 75 kg     BMI:  Body mass index is 26.69 kg/m.  Estimated Nutritional Needs:  Kcal:  1900-2100 kcal Protein:  100-115 grams Fluid:  >/= 2 L/day     Jarome Matin, MS, RD, LDN, CNSC Registered Dietitian II Inpatient Clinical Nutrition RD pager # and on-call/weekend pager # available in [facility]

## 2021-07-14 LAB — CBC
HCT: 27 % — ABNORMAL LOW (ref 36.0–46.0)
Hemoglobin: 8.8 g/dL — ABNORMAL LOW (ref 12.0–15.0)
MCH: 29.8 pg (ref 26.0–34.0)
MCHC: 32.6 g/dL (ref 30.0–36.0)
MCV: 91.5 fL (ref 80.0–100.0)
Platelets: 101 10*3/uL — ABNORMAL LOW (ref 150–400)
RBC: 2.95 MIL/uL — ABNORMAL LOW (ref 3.87–5.11)
RDW: 13.1 % (ref 11.5–15.5)
WBC: 3.8 10*3/uL — ABNORMAL LOW (ref 4.0–10.5)
nRBC: 0 % (ref 0.0–0.2)

## 2021-07-14 LAB — BASIC METABOLIC PANEL
Anion gap: 5 (ref 5–15)
BUN: 12 mg/dL (ref 6–20)
CO2: 25 mmol/L (ref 22–32)
Calcium: 8.2 mg/dL — ABNORMAL LOW (ref 8.9–10.3)
Chloride: 108 mmol/L (ref 98–111)
Creatinine, Ser: 0.6 mg/dL (ref 0.44–1.00)
GFR, Estimated: >60 mL/min (ref >60)
Glucose, Bld: 239 mg/dL — ABNORMAL HIGH (ref 70–99)
Potassium: 3.7 mmol/L (ref 3.5–5.1)
Sodium: 138 mmol/L (ref 135–145)

## 2021-07-14 LAB — GLUCOSE, CAPILLARY
Glucose-Capillary: 118 mg/dL — ABNORMAL HIGH (ref 70–99)
Glucose-Capillary: 121 mg/dL — ABNORMAL HIGH (ref 70–99)
Glucose-Capillary: 122 mg/dL — ABNORMAL HIGH (ref 70–99)
Glucose-Capillary: 148 mg/dL — ABNORMAL HIGH (ref 70–99)

## 2021-07-14 MED ORDER — TRAVASOL 10 % IV SOLN
INTRAVENOUS | Status: AC
  Filled 2021-07-14: qty 1094.4

## 2021-07-14 MED ORDER — ALPRAZOLAM 0.25 MG PO TABS
0.2500 mg | ORAL_TABLET | Freq: Two times a day (BID) | ORAL | Status: DC | PRN
  Administered 2021-07-14: 0.25 mg via ORAL
  Filled 2021-07-14: qty 1

## 2021-07-14 MED ORDER — SODIUM CHLORIDE 0.9 % IV SOLN
INTRAVENOUS | Status: DC
Start: 2021-07-14 — End: 2021-07-17

## 2021-07-14 MED ORDER — FENTANYL CITRATE PF 50 MCG/ML IJ SOSY
25.0000 ug | PREFILLED_SYRINGE | INTRAMUSCULAR | Status: DC | PRN
  Administered 2021-07-14 – 2021-07-15 (×5): 25 ug via INTRAVENOUS
  Filled 2021-07-14 (×5): qty 1

## 2021-07-14 MED ORDER — MORPHINE SULFATE (PF) 2 MG/ML IV SOLN: 2.0000 mg | INTRAVENOUS | Status: DC | PRN

## 2021-07-14 NOTE — Progress Notes (Addendum)
Patient ID: Joan Robinson, female   DOB: 12-19-1960, 61 y.o.   MRN: [id number] [facility] Surgery Progress Note:   2 Days Post-Op  Subjective: Mental status is alert .  Complaints soreness in the left upper quadrant-site of G tube and pexy.  . Objective: Vital signs in last 24 hours: Temp:  [98.5 F (36.9 C)-98.9 F (37.2 C)] 98.5 F (36.9 C) (06/30 0546) Pulse Rate:  [78-93] 88 (06/30 0546) Resp:  [16-18] 18 (06/30 0546) BP: (115-141)/(70-74) 129/70 (06/30 0546) SpO2:  [94 %-100 %] 95 % (06/30 0546)  Intake/Output from previous day: 06/29 0701 - 06/30 0700 In: 3275.2 [P.O.:840; I.V.:2435.2] Out: 5610 [Urine:5200; Drains:410] Intake/Output this shift: No intake/output data recorded.  Physical Exam: Work of breathing is not labored.  JP drainage is light serosanguinous  Lab Results:  Results for orders placed or performed during the hospital encounter of 07/12/21 (from the past 48 hour(s))  CBC     Status: Abnormal   Collection Time: 07/12/21  6:28 PM  Result Value Ref Range   WBC 4.6 4.0 - 10.5 K/uL   RBC 2.95 (L) 3.87 - 5.11 MIL/uL   Hemoglobin 8.9 (L) 12.0 - 15.0 g/dL   HCT 82.9 (L) 56.2 - 13.0 %   MCV 91.2 80.0 - 100.0 fL   MCH 30.2 26.0 - 34.0 pg   MCHC 33.1 30.0 - 36.0 g/dL   RDW 86.5 78.4 - 69.6 %   Platelets 108 (L) 150 - 400 K/uL   nRBC 0.0 0.0 - 0.2 %    Comment: Performed at [facility], [address], [place], [place] [address]  Creatinine, serum     Status: None   Collection Time: 07/12/21  6:28 PM  Result Value Ref Range   Creatinine, Ser 0.68 0.44 - 1.00 mg/dL   GFR, Estimated >41 >32 mL/min    Comment: (NOTE) Calculated using the CKD-EPI Creatinine Equation (2021) Performed at [facility], [address], [place], [place] [address]   CBC     Status: Abnormal   Collection Time: 07/13/21  4:16 AM  Result Value Ref Range   WBC 5.6 4.0 - 10.5 K/uL   RBC 2.84 (L) 3.87 - 5.11 MIL/uL   Hemoglobin 8.5 (L) 12.0  - 15.0 g/dL   HCT 27.2 (L) 53.6 - 64.4 %   MCV 90.5 80.0 - 100.0 fL   MCH 29.9 26.0 - 34.0 pg   MCHC 33.1 30.0 - 36.0 g/dL   RDW 03.4 74.2 - 59.5 %   Platelets 104 (L) 150 - 400 K/uL   nRBC 0.0 0.0 - 0.2 %    Comment: Performed at [facility], [address] [place], [place] [address]  Comprehensive metabolic panel     Status: Abnormal   Collection Time: 07/13/21  4:16 AM  Result Value Ref Range   Sodium 140 135 - 145 mmol/L   Potassium 4.2 3.5 - 5.1 mmol/L   Chloride 110 98 - 111 mmol/L   CO2 23 22 - 32 mmol/L   Glucose, Bld 136 (H) 70 - 99 mg/dL    Comment: Glucose reference range applies only to samples taken after fasting for at least 8 hours.   BUN 15 6 - 20 mg/dL   Creatinine, Ser 6.43 0.44 - 1.00 mg/dL   Calcium 8.5 (L) 8.9 - 10.3 mg/dL   Total Protein 5.0 (L) 6.5 - 8.1 g/dL   Albumin 2.8 (L) 3.5 - 5.0 g/dL   AST 85 (  H) 15 - 41 U/L   ALT 84 (H) 0 - 44 U/L   Alkaline Phosphatase 58 38 - 126 U/L   Total Bilirubin 0.6 0.3 - 1.2 mg/dL   GFR, Estimated >47 >82 mL/min    Comment: (NOTE) Calculated using the CKD-EPI Creatinine Equation (2021)    Anion gap 7 5 - 15    Comment: Performed at [facility], [address], [place], [place] [address]  Magnesium     Status: None   Collection Time: 07/13/21  4:16 AM  Result Value Ref Range   Magnesium 1.8 1.7 - 2.4 mg/dL    Comment: Performed at [facility], [address], [place], [place] [address]  Phosphorus     Status: None   Collection Time: 07/13/21  4:16 AM  Result Value Ref Range   Phosphorus 3.7 2.5 - 4.6 mg/dL    Comment: Performed at [facility], [address] [place], [place] [address]  Glucose, capillary     Status: Abnormal   Collection Time: 07/13/21 11:55 PM  Result Value Ref Range   Glucose-Capillary 174 (H) 70 - 99 mg/dL    Comment: Glucose reference range applies only to samples taken after fasting for at least 8 hours.  Basic  metabolic panel     Status: Abnormal   Collection Time: 07/14/21  4:39 AM  Result Value Ref Range   Sodium 138 135 - 145 mmol/L   Potassium 3.7 3.5 - 5.1 mmol/L   Chloride 108 98 - 111 mmol/L   CO2 25 22 - 32 mmol/L   Glucose, Bld 239 (H) 70 - 99 mg/dL    Comment: Glucose reference range applies only to samples taken after fasting for at least 8 hours.   BUN 12 6 - 20 mg/dL   Creatinine, Ser 6.29 0.44 - 1.00 mg/dL   Calcium 8.2 (L) 8.9 - 10.3 mg/dL   GFR, Estimated >52 >84 mL/min    Comment: (NOTE) Calculated using the CKD-EPI Creatinine Equation (2021)    Anion gap 5 5 - 15    Comment: Performed at [facility], [address] [place], [place] [address]  CBC     Status: Abnormal   Collection Time: 07/14/21  4:39 AM  Result Value Ref Range   WBC 3.8 (L) 4.0 - 10.5 K/uL   RBC 2.95 (L) 3.87 - 5.11 MIL/uL   Hemoglobin 8.8 (L) 12.0 - 15.0 g/dL   HCT 01.0 (L) 27.2 - 53.6 %   MCV 91.5 80.0 - 100.0 fL   MCH 29.8 26.0 - 34.0 pg   MCHC 32.6 30.0 - 36.0 g/dL   RDW 64.4 03.4 - 74.2 %   Platelets 101 (L) 150 - 400 K/uL    Comment: Immature Platelet Fraction may be clinically indicated, consider ordering this additional test [id number]    nRBC 0.0 0.0 - 0.2 %    Comment: Performed at [facility], [address] [place], [place] [address]  Glucose, capillary     Status: Abnormal   Collection Time: 07/14/21  5:44 AM  Result Value Ref Range   Glucose-Capillary 148 (H) 70 - 99 mg/dL    Comment: Glucose reference range applies only to samples taken after fasting for at least 8 hours.    Radiology/Results: DG UGI W SINGLE CM (SOL OR THIN BA)  Result Date: 07/13/2021 CLINICAL DATA:  Status post hiatal hernia repair. EXAM: UPPER GI SERIES WITHOUT KUB TECHNIQUE: A problem oriented  water-soluble upper GI series was performed to assess for leak, obstruction or persistent hiatal hernia status post hiatal hernia repair. FLUOROSCOPY: Fluoroscopy time: 2  minutes, 24 seconds Radiation Exposure Index (as provided by the fluoroscopic device): 53.10 mGy Kerma COMPARISON:  Upper GI series 06/15/2021. CT abdomen/pelvis 06/08/2021. FINDINGS: There was prompt passage of contrast from the esophagus into the stomach. Smoothly narrowed appearance of the distal esophagus, unchanged from the recent prior upper GI series of 06/15/2021. Mild intermittent esophageal dysmotility. Small-volume gastroesophageal reflux was observed to the level of the lower esophagus. Although decreased in size, there is a persistent moderate-to-large hiatal hernia. The gastric cardia, fundus and portions of the gastric body remain displaced within the chest. A gastrostomy tube is now present. Otherwise unremarkable appearance of the stomach, duodenal bulb and duodenal sweep. There was prompt passage of contrast from the stomach into the proximal small bowel. No extraluminal contrast was demonstrated to suggest a leak. IMPRESSION: Although decreased in size from the prior upper GI series of 06/15/2021, there is a persistent moderate-to-large hiatal hernia. The gastric cardia, fundus and portions of the gastric body remain displaced within the chest. Prompt passage of contrast from the esophagus into the stomach, and from the stomach into the proximal small bowel. No evidence of leak. Unchanged smoothly narrowed appearance of the distal esophagus. Correlate with findings on recent laparoscopy and endoscopy. Mild intermittent esophageal dysmotility. Small-volume gastroesophageal reflux observed to the level of the lower esophagus. Electronically Signed   By: Jackey Loge D.O.   On: 07/13/2021 09:59   DG Chest Port 1 View  Result Date: 07/12/2021 CLINICAL DATA:  Post cast placement. EXAM: PORTABLE CHEST 1 VIEW COMPARISON:  Chest x-ray 06/29/2021 FINDINGS: Right upper extremity PICC terminates over the distal SVC, unchanged. There is increasing patchy opacity in the left lung base. Large hiatal hernia  again seen. No pleural effusion or pneumothorax. Cardiomediastinal silhouette is within normal limits. No acute fractures are seen. IMPRESSION: 1. New left lower lobe airspace disease. 2. Stable large hiatal hernia. 3. Right upper extremity PICC terminates over the distal SVC. Electronically Signed   By: Darliss Cheney M.D.   On: 07/12/2021 15:15    Anti-infectives: Anti-infectives (From admission, onward)    Start     Dose/Rate Route Frequency Ordered Stop   07/12/21 2000  cefoTEtan (CEFOTAN) 2 g in sodium chloride 0.9 % 100 mL IVPB        2 g 200 mL/hr over 30 Minutes Intravenous Every 12 hours 07/12/21 1751 07/12/21 2127   07/12/21 0600  cefoTEtan (CEFOTAN) 2 g in sodium chloride 0.9 % 100 mL IVPB        2 g 200 mL/hr over 30 Minutes Intravenous On call to O.R. 07/12/21 0527 07/12/21 0804       Assessment/Plan: Problem List: Patient Active Problem List   Diagnosis Date Noted   Hiatal hernia 07/12/2021   Status post laparoscopic Nissen fundoplication 05/01/2021   IBS (irritable bowel syndrome) 07/27/2016   High-tone pelvic floor dysfunction 05/09/2016   Midline cystocele 05/09/2016   Vaginal atrophy 05/09/2016   Bladder prolapse, female, acquired 05/07/2016   Dystrophic nail 04/29/2016   Fatigue 04/27/2016   Sleep apnea 04/27/2016   UTI (urinary tract infection) 09/11/2015   Cataract cortical, senile, left 12/20/2014   Rectal lesion 10/10/2014   Other type of migraine without status migrainosus 05/06/2014   Dyspepsia 05/06/2014   Postmenopausal estrogen deficiency 05/06/2014   Screening for breast cancer 05/06/2014   Bilateral low-tension glaucoma, indeterminate stage  12/29/2013   High myopia, bilateral 12/29/2013   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   Obesity 11/01/2013   Eustachian tube dysfunction 10/08/2013   Otalgia of left ear 10/08/2013   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   Arthritis 07/24/2013   Anxiety attack  03/08/2013   Paresthesia 12/22/2012   Headache(784.0) 08/17/2012   BCC (basal cell carcinoma of skin) 06/01/2012   Left arm pain 05/10/2012   Right knee pain 05/10/2012   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   ADD (attention deficit disorder) 04/13/2012   Perimenopausal 01/16/2012   Macular pucker of both eyes 10/18/2011   Vitreous degeneration 10/18/2011   Anemia 10/10/2011   Preventative health care 10/10/2011   Urticaria, chronic 10/10/2011   Macular pucker, bilateral 10/10/2011   Lower back pain    DIVERTICULITIS-COLON 07/25/2009   Diverticulitis of colon 07/25/2009   Essential hypertension 06/09/2009   HELICOBACTER PYLORI GASTRITIS, HX OF 09/06/2008   GERD 07/27/2008   Bipolar disorder (HCC) 01/17/2007   Hypothyroidism 08/24/2006   Allergic rhinitis 08/24/2006   URINARY INCONTINENCE 08/24/2006    She is tolerating clears but not drinking robustly.  Will offer full liquid diet.  Hg 8.8-- heparin was held.  In the afternoon I started her on prn Xanax and increased her fentanyl.   2 Days Post-Op    LOS: 2 days   Matt B. Daphine Deutscher, MD, [facility] [phone] to reach the surgeon on call.    07/14/2021 7:25 AM

## 2021-07-14 NOTE — Progress Notes (Signed)
PHARMACY - TOTAL PARENTERAL NUTRITION CONSULT NOTE   Indication:  bowel rest s/p Nissen and hiatal hernia repair. Inability to take PO  Patient Measurements: Height: '5\' 6"'$  (167.6 cm) Weight: 75 kg (165 lb 5.5 oz) IBW/kg (Calculated) : 59.3 TPN AdjBW (KG): 63.2 Body mass index is 26.69 kg/m.  Assessment:  61 yo F presented ~9 weeks post Nissen and hiatal hernia repair with a recurrent hernia with obstruction. Also with dysphagia and esopagitis. Has been on TPN since 6/16.  Glucose / Insulin: No hx of DM. Electrolytes: wnl Renal: SCr wnl Hepatic: LFTs slightly elevated. Tbili wnl. Albumin 2.8 Intake / Output; MIVF: Drain output inc to 410 mL. Good UOP.  - Gtube clamped GI Imaging: Xray shows stable hiatal hernia GI Surgeries / Procedures: Lap with foregut dissection, partial stomach reduction, gastropexy, and gastrostomy tube placement.  Central access: PICC TPN start date: PTA 6/16 >>  Nutritional Goals: Goal TPN rate is 80 mL/hr (provides 109 g of protein and 1,890 kcals per day)  PTA TPN goal: 33m/hr (provides 110g of protein and 1,857 kcal per day)  RD Assessment: Estimated Needs Total Energy Estimated Needs: 1900-2100 kcal Total Protein Estimated Needs: 100-115 grams Total Fluid Estimated Needs: >/= 2 L/day  Current Nutrition:  Clear liquids and TPN  Plan:  Continue TPN at 876mhr at 1800 Electrolytes in TPN: Na 5032mL, K 42m54m, Ca 5mEq47m Mg 5mEq/84mand Phos 15mmol8mCl:Ac 1:1 Add standard MVI and trace elements to TPN Continue Sensitive q6h SSI and adjust as needed  IVF: change to NS at 40 mL/hr (total IVF rate 120 ml/hr per MD) Monitor TPN labs on Mon/Thurs, check Bmet tomorrow   ChristiGretta Arab, BCPS Clinical Pharmacist [facility] main pharmacy [phone] 7:08 AM

## 2021-07-14 NOTE — Progress Notes (Signed)
Pt is allergic to Toradol unable to place order for one time dose.

## 2021-07-15 ENCOUNTER — Inpatient Hospital Stay (HOSPITAL_COMMUNITY): Payer: BC Managed Care – PPO

## 2021-07-15 LAB — CBC WITH DIFFERENTIAL/PLATELET
Abs Immature Granulocytes: 0.01 10*3/uL (ref 0.00–0.07)
Basophils Absolute: 0 10*3/uL (ref 0.0–0.1)
Basophils Relative: 0 %
Eosinophils Absolute: 0.1 10*3/uL (ref 0.0–0.5)
Eosinophils Relative: 4 %
HCT: 27.1 % — ABNORMAL LOW (ref 36.0–46.0)
Hemoglobin: 8.8 g/dL — ABNORMAL LOW (ref 12.0–15.0)
Immature Granulocytes: 0 %
Lymphocytes Relative: 24 %
Lymphs Abs: 0.9 10*3/uL (ref 0.7–4.0)
MCH: 29.7 pg (ref 26.0–34.0)
MCHC: 32.5 g/dL (ref 30.0–36.0)
MCV: 91.6 fL (ref 80.0–100.0)
Monocytes Absolute: 0.4 10*3/uL (ref 0.1–1.0)
Monocytes Relative: 12 %
Neutro Abs: 2.1 10*3/uL (ref 1.7–7.7)
Neutrophils Relative %: 60 %
Platelets: 109 10*3/uL — ABNORMAL LOW (ref 150–400)
RBC: 2.96 MIL/uL — ABNORMAL LOW (ref 3.87–5.11)
RDW: 12.7 % (ref 11.5–15.5)
WBC: 3.5 10*3/uL — ABNORMAL LOW (ref 4.0–10.5)
nRBC: 0 % (ref 0.0–0.2)

## 2021-07-15 LAB — GLUCOSE, CAPILLARY
Glucose-Capillary: 127 mg/dL — ABNORMAL HIGH (ref 70–99)
Glucose-Capillary: 116 mg/dL — ABNORMAL HIGH (ref 70–99)
Glucose-Capillary: 118 mg/dL — ABNORMAL HIGH (ref 70–99)

## 2021-07-15 MED ORDER — MAGIC MOUTHWASH: 15.0000 mL | Freq: Four times a day (QID) | ORAL | Status: DC | PRN

## 2021-07-15 MED ORDER — PHENOL 1.4 % MT LIQD: 2.0000 | OROMUCOSAL | Status: DC | PRN

## 2021-07-15 MED ORDER — BISACODYL 10 MG RE SUPP
10.0000 mg | Freq: Every day | RECTAL | Status: DC
  Filled 2021-07-15: qty 1

## 2021-07-15 MED ORDER — LACTATED RINGERS IV BOLUS: 1000.0000 mL | Freq: Three times a day (TID) | INTRAVENOUS | Status: AC | PRN

## 2021-07-15 MED ORDER — TRAVASOL 10 % IV SOLN
INTRAVENOUS | Status: AC
  Filled 2021-07-15: qty 1094.4

## 2021-07-15 MED ORDER — MENTHOL 3 MG MT LOZG: 1.0000 | LOZENGE | OROMUCOSAL | Status: DC | PRN

## 2021-07-15 MED ORDER — DIPHENHYDRAMINE HCL 50 MG/ML IJ SOLN: 12.5000 mg | Freq: Four times a day (QID) | INTRAMUSCULAR | Status: DC | PRN

## 2021-07-15 MED ORDER — SODIUM CHLORIDE 0.9% FLUSH: 10.0000 mL | INTRAVENOUS | Status: DC | PRN

## 2021-07-15 MED ORDER — OXYCODONE HCL 5 MG/5ML PO SOLN
5.0000 mg | ORAL | Status: DC | PRN
  Administered 2021-07-15: 5 mg
  Filled 2021-07-15: qty 5

## 2021-07-15 MED ORDER — HYDROMORPHONE HCL 1 MG/ML IJ SOLN
1.0000 mg | INTRAMUSCULAR | Status: DC | PRN
  Administered 2021-07-15 – 2021-07-17 (×7): 1 mg via INTRAVENOUS
  Filled 2021-07-15 (×8): qty 1

## 2021-07-15 MED ORDER — METOPROLOL TARTRATE 5 MG/5ML IV SOLN: 5.0000 mg | Freq: Four times a day (QID) | INTRAVENOUS | Status: DC | PRN

## 2021-07-15 MED ORDER — ENOXAPARIN SODIUM 40 MG/0.4ML IJ SOSY
40.0000 mg | PREFILLED_SYRINGE | INTRAMUSCULAR | Status: DC
  Administered 2021-07-15 – 2021-07-16 (×2): 40 mg via SUBCUTANEOUS
  Filled 2021-07-15 (×3): qty 0.4

## 2021-07-15 MED ORDER — LIP MEDEX EX OINT
TOPICAL_OINTMENT | Freq: Two times a day (BID) | CUTANEOUS | Status: DC
  Administered 2021-07-15: 1 via TOPICAL
  Filled 2021-07-15: qty 7

## 2021-07-15 MED ORDER — ACETAMINOPHEN 650 MG RE SUPP: 650.0000 mg | Freq: Four times a day (QID) | RECTAL | Status: DC | PRN

## 2021-07-15 MED ORDER — METHOCARBAMOL 1000 MG/10ML IJ SOLN: 500.0000 mg | Freq: Four times a day (QID) | INTRAVENOUS | Status: DC | PRN

## 2021-07-15 MED ORDER — SIMETHICONE 80 MG PO CHEW: 80.0000 mg | CHEWABLE_TABLET | Freq: Four times a day (QID) | ORAL | Status: DC | PRN

## 2021-07-15 MED ORDER — SODIUM CHLORIDE 0.9% FLUSH: 10.0000 mL | Freq: Two times a day (BID) | INTRAVENOUS | Status: DC

## 2021-07-15 MED ORDER — ALUM & MAG HYDROXIDE-SIMETH 200-200-20 MG/5ML PO SUSP: 30.0000 mL | Freq: Four times a day (QID) | ORAL | Status: DC | PRN

## 2021-07-15 MED ORDER — ACETAMINOPHEN 160 MG/5ML PO SOLN
650.0000 mg | Freq: Four times a day (QID) | ORAL | Status: DC
  Administered 2021-07-15 – 2021-07-16 (×4): 650 mg
  Filled 2021-07-15 (×5): qty 20.3

## 2021-07-15 MED ORDER — LEVOTHYROXINE SODIUM 100 MCG/5ML IV SOLN: 100.0000 ug | Freq: Every day | INTRAVENOUS | Status: DC

## 2021-07-15 NOTE — Progress Notes (Signed)
PHARMACY - TOTAL PARENTERAL NUTRITION CONSULT NOTE   Indication:  bowel rest s/p Nissen and hiatal hernia repair. Inability to take PO  Patient Measurements: Height: '5\' 6"'$  (167.6 cm) Weight: 75 kg (165 lb 5.5 oz) IBW/kg (Calculated) : 59.3 TPN AdjBW (KG): 63.2 Body mass index is 26.69 kg/m.  Assessment:  61 yo F presented ~9 weeks post Nissen and hiatal hernia repair with a recurrent hernia with obstruction. Also with dysphagia and esopagitis. Has been on TPN since 6/16.  Glucose / Insulin: No hx of DM.  CBGs ok with minimal SSI use.   Electrolytes: wnl Renal: SCr wnl Hepatic: LFTs slightly elevated. Tbili wnl. Albumin 2.8 Intake / Output; MIVF: Drain output dec to 215 mL UOP not documented.  GI Imaging: Xray shows stable hiatal hernia GI Surgeries / Procedures: 6/28 Lap with foregut dissection, partial stomach reduction, gastropexy, and gastrostomy tube placement.  Central access: PICC TPN start date: PTA 6/16 >>  Nutritional Goals: Inpatient Goal TPN rate is 80 mL/hr (provides 109 g of protein and 1,890 kcals per day) PTA TPN goal:  70m/hr (provides 110g of protein and 1,857 kcal per day)  RD Assessment: Estimated Needs Total Energy Estimated Needs: 1900-2100 kcal Total Protein Estimated Needs: 100-115 grams Total Fluid Estimated Needs: >/= 2 L/day  Current Nutrition:  Full liquids and TPN - Gtube clamped and planning to hold off on starting tube feeds until belching, dysphagia symptoms have a chance to improve post op.  Plan:  Continue TPN at goal rate, 80 mL/hr  Electrolytes in TPN: Na 5108m/L, K 5063mL, Ca 5mE44m, Mg 5mEq6m and Phos 15mmo77m Cl:Ac 1:1 Add standard MVI and trace elements to TPN Continue Sensitive q6h SSI and adjust as needed  IVF: continue NS at 40 mL/hr (total IVF rate 120 ml/hr per MD) Monitor TPN labs on Mon/Thurs, check Bmet tomorrow   ChristGretta ArabD, BCPS Clinical Pharmacist [facility] main pharmacy [phone] 9:41 AM

## 2021-07-15 NOTE — Progress Notes (Signed)
Pharmacy: Lovenox for VTE prophylaxis  Patient is a 61 y.o F who is known to pharmacy from current TPN consult. Pharmacy has been consulted to dose lovenox fr VTE prophlyaxis.  - s/p lap repair of hiatal hernia on 6/28 - scr 0.60 (crcl~77) on 6/30 - hgb and cbc low but stable  Plan: - lovenox 40 mg SQ daily - pharmacy will sign off for lovenox. Re-consult [place] if need further assistance.  Dia Sitter, PharmD, BCPS 07/15/2021 8:23 AM

## 2021-07-15 NOTE — Progress Notes (Signed)
Patient ID: Joan Robinson, female   DOB: 08-28-1960, 61 y.o.   MRN: [id number] [facility] Progress Note:   3 Days Post-Op  Subjective: No major complaints.  Still feels things sticking in her throat/dysphagia.  Only sipping a little coke.  Belching hurts.  Has been up walking.  A little short of breath with ambulation.   Objective: Vital signs in last 24 hours: Temp:  [98.1 F (36.7 C)-98.6 F (37 C)] 98.2 F (36.8 C) (07/01 0513) Pulse Rate:  [74-88] 74 (07/01 0513) Resp:  [18] 18 (07/01 0513) BP: (124-141)/(71-83) 127/83 (07/01 0513) SpO2:  [96 %-99 %] 96 % (07/01 0513)  Intake/Output from previous day: 06/30 0701 - 07/01 0700 In: 2545.1 [P.O.:480; I.V.:2065.1] Out: 215 [Drains:215] Intake/Output this shift: No intake/output data recorded.  Physical Exam: Work of breathing is not labored.  JP drainage is serosanguinous, incisions c/d/I, G tube clamped  Lab Results:  Results for orders placed or performed during the hospital encounter of 07/12/21 (from the past 48 hour(s))  Glucose, capillary     Status: Abnormal   Collection Time: 07/13/21 11:55 PM  Result Value Ref Range   Glucose-Capillary 174 (H) 70 - 99 mg/dL    Comment: Glucose reference range applies only to samples taken after fasting for at least 8 hours.  Basic metabolic panel     Status: Abnormal   Collection Time: 07/14/21  4:39 AM  Result Value Ref Range   Sodium 138 135 - 145 mmol/L   Potassium 3.7 3.5 - 5.1 mmol/L   Chloride 108 98 - 111 mmol/L   CO2 25 22 - 32 mmol/L   Glucose, Bld 239 (H) 70 - 99 mg/dL    Comment: Glucose reference range applies only to samples taken after fasting for at least 8 hours.   BUN 12 6 - 20 mg/dL   Creatinine, Ser 6.57 0.44 - 1.00 mg/dL   Calcium 8.2 (L) 8.9 - 10.3 mg/dL   GFR, Estimated >84 >69 mL/min    Comment: (NOTE) Calculated using the CKD-EPI Creatinine Equation (2021)    Anion gap 5 5 - 15    Comment: Performed at [facility], [address] [place], [place] [address]  CBC     Status: Abnormal   Collection Time: 07/14/21  4:39 AM  Result Value Ref Range   WBC 3.8 (L) 4.0 - 10.5 K/uL   RBC 2.95 (L) 3.87 - 5.11 MIL/uL   Hemoglobin 8.8 (L) 12.0 - 15.0 g/dL   HCT 84.1 (L) 32.4 - 40.1 %   MCV 91.5 80.0 - 100.0 fL   MCH 29.8 26.0 - 34.0 pg   MCHC 32.6 30.0 - 36.0 g/dL   RDW 02.7 25.3 - 66.4 %   Platelets 101 (L) 150 - 400 K/uL    Comment: Immature Platelet Fraction may be clinically indicated, consider ordering this additional test [id number]    nRBC 0.0 0.0 - 0.2 %    Comment: Performed at [facility], [address], [place], [place] [address]  Glucose, capillary     Status: Abnormal   Collection Time: 07/14/21  5:44 AM  Result Value Ref Range   Glucose-Capillary 148 (H) 70 - 99 mg/dL    Comment: Glucose reference range applies only to samples taken after fasting for at least 8 hours.  Glucose, capillary     Status: Abnormal   Collection Time: 07/14/21 11:45 AM  Result Value Ref Range   Glucose-Capillary 122 (H) 70 -  99 mg/dL    Comment: Glucose reference range applies only to samples taken after fasting for at least 8 hours.  Glucose, capillary     Status: Abnormal   Collection Time: 07/14/21  5:50 PM  Result Value Ref Range   Glucose-Capillary 121 (H) 70 - 99 mg/dL    Comment: Glucose reference range applies only to samples taken after fasting for at least 8 hours.  Glucose, capillary     Status: Abnormal   Collection Time: 07/14/21 11:45 PM  Result Value Ref Range   Glucose-Capillary 118 (H) 70 - 99 mg/dL    Comment: Glucose reference range applies only to samples taken after fasting for at least 8 hours.  CBC with Differential/Platelet     Status: Abnormal   Collection Time: 07/15/21  5:20 AM  Result Value Ref Range   WBC 3.5 (L) 4.0 - 10.5 K/uL   RBC 2.96 (L) 3.87 - 5.11 MIL/uL   Hemoglobin 8.8 (L) 12.0 - 15.0 g/dL   HCT 69.6 (L) 29.5 - 28.4 %   MCV 91.6 80.0 - 100.0 fL   MCH  29.7 26.0 - 34.0 pg   MCHC 32.5 30.0 - 36.0 g/dL   RDW 13.2 44.0 - 10.2 %   Platelets 109 (L) 150 - 400 K/uL   nRBC 0.0 0.0 - 0.2 %   Neutrophils Relative % 60 %   Neutro Abs 2.1 1.7 - 7.7 K/uL   Lymphocytes Relative 24 %   Lymphs Abs 0.9 0.7 - 4.0 K/uL   Monocytes Relative 12 %   Monocytes Absolute 0.4 0.1 - 1.0 K/uL   Eosinophils Relative 4 %   Eosinophils Absolute 0.1 0.0 - 0.5 K/uL   Basophils Relative 0 %   Basophils Absolute 0.0 0.0 - 0.1 K/uL   Immature Granulocytes 0 %   Abs Immature Granulocytes 0.01 0.00 - 0.07 K/uL    Comment: Performed at [facility], [address] [place], [place] [address]  Glucose, capillary     Status: Abnormal   Collection Time: 07/15/21  5:52 AM  Result Value Ref Range   Glucose-Capillary 127 (H) 70 - 99 mg/dL    Comment: Glucose reference range applies only to samples taken after fasting for at least 8 hours.    Radiology/Results: DG UGI W SINGLE CM (SOL OR THIN BA)  Result Date: 07/13/2021 CLINICAL DATA:  Status post hiatal hernia repair. EXAM: UPPER GI SERIES WITHOUT KUB TECHNIQUE: A problem oriented water-soluble upper GI series was performed to assess for leak, obstruction or persistent hiatal hernia status post hiatal hernia repair. FLUOROSCOPY: Fluoroscopy time: 2 minutes, 24 seconds Radiation Exposure Index (as provided by the fluoroscopic device): 53.10 mGy Kerma COMPARISON:  Upper GI series 06/15/2021. CT abdomen/pelvis 06/08/2021. FINDINGS: There was prompt passage of contrast from the esophagus into the stomach. Smoothly narrowed appearance of the distal esophagus, unchanged from the recent prior upper GI series of 06/15/2021. Mild intermittent esophageal dysmotility. Small-volume gastroesophageal reflux was observed to the level of the lower esophagus. Although decreased in size, there is a persistent moderate-to-large hiatal hernia. The gastric cardia, fundus and portions of the gastric body remain displaced within the  chest. A gastrostomy tube is now present. Otherwise unremarkable appearance of the stomach, duodenal bulb and duodenal sweep. There was prompt passage of contrast from the stomach into the proximal small bowel. No extraluminal contrast was demonstrated to suggest a leak. IMPRESSION: Although decreased in size from the prior upper GI series of 06/15/2021, there is a  persistent moderate-to-large hiatal hernia. The gastric cardia, fundus and portions of the gastric body remain displaced within the chest. Prompt passage of contrast from the esophagus into the stomach, and from the stomach into the proximal small bowel. No evidence of leak. Unchanged smoothly narrowed appearance of the distal esophagus. Correlate with findings on recent laparoscopy and endoscopy. Mild intermittent esophageal dysmotility. Small-volume gastroesophageal reflux observed to the level of the lower esophagus. Electronically Signed   By: Jackey Loge D.O.   On: 07/13/2021 09:59    Anti-infectives: Anti-infectives (From admission, onward)    Start     Dose/Rate Route Frequency Ordered Stop   07/12/21 2000  cefoTEtan (CEFOTAN) 2 g in sodium chloride 0.9 % 100 mL IVPB        2 g 200 mL/hr over 30 Minutes Intravenous Every 12 hours 07/12/21 1751 07/12/21 2127   07/12/21 0600  cefoTEtan (CEFOTAN) 2 g in sodium chloride 0.9 % 100 mL IVPB        2 g 200 mL/hr over 30 Minutes Intravenous On call to O.R. 07/12/21 0527 07/12/21 0804       Assessment/Plan: Problem List: Patient Active Problem List   Diagnosis Date Noted   Hiatal hernia 07/12/2021   Status post laparoscopic Nissen fundoplication 05/01/2021   IBS (irritable bowel syndrome) 07/27/2016   High-tone pelvic floor dysfunction 05/09/2016   Midline cystocele 05/09/2016   Vaginal atrophy 05/09/2016   Bladder prolapse, female, acquired 05/07/2016   Dystrophic nail 04/29/2016   Fatigue 04/27/2016   Sleep apnea 04/27/2016   UTI (urinary tract infection) 09/11/2015    Cataract cortical, senile, left 12/20/2014   Rectal lesion 10/10/2014   Other type of migraine without status migrainosus 05/06/2014   Dyspepsia 05/06/2014   Postmenopausal estrogen deficiency 05/06/2014   Screening for breast cancer 05/06/2014   Bilateral low-tension glaucoma, indeterminate stage 12/29/2013   High myopia, bilateral 12/29/2013   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   Obesity 11/01/2013   Eustachian tube dysfunction 10/08/2013   Otalgia of left ear 10/08/2013   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   Arthritis 07/24/2013   Anxiety attack 03/08/2013   Paresthesia 12/22/2012   Headache(784.0) 08/17/2012   BCC (basal cell carcinoma of skin) 06/01/2012   Left arm pain 05/10/2012   Right knee pain 05/10/2012   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   ADD (attention deficit disorder) 04/13/2012   Perimenopausal 01/16/2012   Macular pucker of both eyes 10/18/2011   Vitreous degeneration 10/18/2011   Anemia 10/10/2011   Preventative health care 10/10/2011   Urticaria, chronic 10/10/2011   Macular pucker, bilateral 10/10/2011   Lower back pain    DIVERTICULITIS-COLON 07/25/2009   Diverticulitis of colon 07/25/2009   Essential hypertension 06/09/2009   HELICOBACTER PYLORI GASTRITIS, HX OF 09/06/2008   GERD 07/27/2008   Bipolar disorder (HCC) 01/17/2007   Hypothyroidism 08/24/2006   Allergic rhinitis 08/24/2006   URINARY INCONTINENCE 08/24/2006   Assessment and Plan: Ms. Mclerran is a 61 year old female who underwent laparoscopic repair of an early hiatal hernia recurrence on 07/12/21.  Due to dense adhesions to mediastinal structures, the stomach was unable to be completely reduced, but it was straightened out and partially reduced and a G-tube was placed.    Dysphagia persists - unsure if this is postoperative or related to incomplete reduction of the stomach, needs more time to recover G tube clamped - hold off on starting tube  feeds until belching, dysphagia symptoms have a chance to improve  post op TPN - continue TPN as patient unable to tolerate adequate oral intake due to dysphagia   Acute blood loss anemia - HGB stable 8.8 from 8.8.  Restart prophylactic subcutaneous lovenox Recheck cxr today Adjust pain meds   3 Days Post-Op    LOS: 3 days   Quentin Ore, MD   [facility] - a [facility] [phone] to reach the surgeon on call.    07/15/2021 7:54 AM

## 2021-07-16 LAB — BASIC METABOLIC PANEL
Anion gap: 4 — ABNORMAL LOW (ref 5–15)
BUN: 20 mg/dL (ref 6–20)
CO2: 24 mmol/L (ref 22–32)
Calcium: 8.2 mg/dL — ABNORMAL LOW (ref 8.9–10.3)
Chloride: 109 mmol/L (ref 98–111)
Creatinine, Ser: 0.65 mg/dL (ref 0.44–1.00)
GFR, Estimated: >60 mL/min (ref >60)
Glucose, Bld: 115 mg/dL — ABNORMAL HIGH (ref 70–99)
Potassium: 4.4 mmol/L (ref 3.5–5.1)
Sodium: 137 mmol/L (ref 135–145)

## 2021-07-16 LAB — GLUCOSE, CAPILLARY
Glucose-Capillary: 111 mg/dL — ABNORMAL HIGH (ref 70–99)
Glucose-Capillary: 114 mg/dL — ABNORMAL HIGH (ref 70–99)
Glucose-Capillary: 114 mg/dL — ABNORMAL HIGH (ref 70–99)
Glucose-Capillary: 118 mg/dL — ABNORMAL HIGH (ref 70–99)
Glucose-Capillary: 138 mg/dL — ABNORMAL HIGH (ref 70–99)

## 2021-07-16 MED ORDER — TRAVASOL 10 % IV SOLN
INTRAVENOUS | Status: DC
  Filled 2021-07-16: qty 1094.4

## 2021-07-16 MED ORDER — VITAL HIGH PROTEIN PO LIQD
1000.0000 mL | ORAL | Status: DC
  Administered 2021-07-16: 1000 mL
  Filled 2021-07-16 (×2): qty 1000

## 2021-07-16 NOTE — Progress Notes (Signed)
Patient ID: Joan Robinson, female   DOB: 12-18-1960, 61 y.o.   MRN: [id number] [facility] Progress Note:   4 Days Post-Op  Subjective: Pain control improved with changes yesterday.  Still not much appetite, just sipping liquids.   Objective: Vital signs in last 24 hours: Temp:  [97.8 F (36.6 C)-98.6 F (37 C)] 98 F (36.7 C) (07/02 0532) Pulse Rate:  [74-94] 74 (07/02 0532) Resp:  [16-18] 16 (07/02 0532) BP: (113-123)/(62-72) 113/62 (07/02 0532) SpO2:  [95 %-96 %] 95 % (07/02 0532)  Intake/Output from previous day: 07/01 0701 - 07/02 0700 In: 2047.6 [P.O.:480; I.V.:1567.6] Out: 95 [Drains:95] Intake/Output this shift: No intake/output data recorded.  Physical Exam: Work of breathing is not labored.  JP drainage is serosanguinous, incisions c/d/I, G tube clamped  Lab Results:  Results for orders placed or performed during the hospital encounter of 07/12/21 (from the past 48 hour(s))  Glucose, capillary     Status: Abnormal   Collection Time: 07/14/21 11:45 AM  Result Value Ref Range   Glucose-Capillary 122 (H) 70 - 99 mg/dL    Comment: Glucose reference range applies only to samples taken after fasting for at least 8 hours.  Glucose, capillary     Status: Abnormal   Collection Time: 07/14/21  5:50 PM  Result Value Ref Range   Glucose-Capillary 121 (H) 70 - 99 mg/dL    Comment: Glucose reference range applies only to samples taken after fasting for at least 8 hours.  Glucose, capillary     Status: Abnormal   Collection Time: 07/14/21 11:45 PM  Result Value Ref Range   Glucose-Capillary 118 (H) 70 - 99 mg/dL    Comment: Glucose reference range applies only to samples taken after fasting for at least 8 hours.  CBC with Differential/Platelet     Status: Abnormal   Collection Time: 07/15/21  5:20 AM  Result Value Ref Range   WBC 3.5 (L) 4.0 - 10.5 K/uL   RBC 2.96 (L) 3.87 - 5.11 MIL/uL   Hemoglobin 8.8 (L) 12.0 - 15.0 g/dL   HCT 86.5 (L) 78.4 - 69.6 %   MCV  91.6 80.0 - 100.0 fL   MCH 29.7 26.0 - 34.0 pg   MCHC 32.5 30.0 - 36.0 g/dL   RDW 29.5 28.4 - 13.2 %   Platelets 109 (L) 150 - 400 K/uL   nRBC 0.0 0.0 - 0.2 %   Neutrophils Relative % 60 %   Neutro Abs 2.1 1.7 - 7.7 K/uL   Lymphocytes Relative 24 %   Lymphs Abs 0.9 0.7 - 4.0 K/uL   Monocytes Relative 12 %   Monocytes Absolute 0.4 0.1 - 1.0 K/uL   Eosinophils Relative 4 %   Eosinophils Absolute 0.1 0.0 - 0.5 K/uL   Basophils Relative 0 %   Basophils Absolute 0.0 0.0 - 0.1 K/uL   Immature Granulocytes 0 %   Abs Immature Granulocytes 0.01 0.00 - 0.07 K/uL    Comment: Performed at [facility], [address], [place], [place] [address]  Glucose, capillary     Status: Abnormal   Collection Time: 07/15/21  5:52 AM  Result Value Ref Range   Glucose-Capillary 127 (H) 70 - 99 mg/dL    Comment: Glucose reference range applies only to samples taken after fasting for at least 8 hours.  Glucose, capillary     Status: Abnormal   Collection Time: 07/15/21 11:38 AM  Result Value Ref Range   Glucose-Capillary 116 (H) 70 -  99 mg/dL    Comment: Glucose reference range applies only to samples taken after fasting for at least 8 hours.  Glucose, capillary     Status: Abnormal   Collection Time: 07/15/21  5:40 PM  Result Value Ref Range   Glucose-Capillary 118 (H) 70 - 99 mg/dL    Comment: Glucose reference range applies only to samples taken after fasting for at least 8 hours.  Glucose, capillary     Status: Abnormal   Collection Time: 07/16/21 12:19 AM  Result Value Ref Range   Glucose-Capillary 114 (H) 70 - 99 mg/dL    Comment: Glucose reference range applies only to samples taken after fasting for at least 8 hours.  Basic metabolic panel     Status: Abnormal   Collection Time: 07/16/21  5:19 AM  Result Value Ref Range   Sodium 137 135 - 145 mmol/L   Potassium 4.4 3.5 - 5.1 mmol/L   Chloride 109 98 - 111 mmol/L   CO2 24 22 - 32 mmol/L   Glucose, Bld 115 (H) 70 - 99 mg/dL     Comment: Glucose reference range applies only to samples taken after fasting for at least 8 hours.   BUN 20 6 - 20 mg/dL   Creatinine, Ser 8.11 0.44 - 1.00 mg/dL   Calcium 8.2 (L) 8.9 - 10.3 mg/dL   GFR, Estimated >91 >47 mL/min    Comment: (NOTE) Calculated using the CKD-EPI Creatinine Equation (2021)    Anion gap 4 (L) 5 - 15    Comment: Performed at [facility], [address] [place], [place] [address]  Glucose, capillary     Status: Abnormal   Collection Time: 07/16/21  6:54 AM  Result Value Ref Range   Glucose-Capillary 118 (H) 70 - 99 mg/dL    Comment: Glucose reference range applies only to samples taken after fasting for at least 8 hours.    Radiology/Results: DG Chest Port 1 View  Result Date: 07/15/2021 CLINICAL DATA:  Encounter for pneumothorax on the left. Shortness of breath with ambulation. EXAM: PORTABLE CHEST 1 VIEW COMPARISON:  Radiographs 07/12/2021 and 06/29/2021. Abdominal CT 06/08/2021. Upper GI series 07/13/2021. FINDINGS: 0814 hours. Right arm PICC projects to the level of the superior cavoatrial junction. A tube projects over the lateral aspect of the left chest. The heart size and mediastinal contours are stable with a moderate to large hiatal hernia. There are persistent low lung volumes with mild bibasilar atelectasis. Patchy left lower lobe airspace disease has improved over the last 3 days. No evidence of pneumothorax. IMPRESSION: Interval improved left lower lobe airspace disease with residual mild bibasilar atelectasis. No evidence of pneumothorax. Electronically Signed   By: Carey Bullocks M.D.   On: 07/15/2021 09:29    Anti-infectives: Anti-infectives (From admission, onward)    Start     Dose/Rate Route Frequency Ordered Stop   07/12/21 2000  cefoTEtan (CEFOTAN) 2 g in sodium chloride 0.9 % 100 mL IVPB        2 g 200 mL/hr over 30 Minutes Intravenous Every 12 hours 07/12/21 1751 07/12/21 2127   07/12/21 0600  cefoTEtan (CEFOTAN)  2 g in sodium chloride 0.9 % 100 mL IVPB        2 g 200 mL/hr over 30 Minutes Intravenous On call to O.R. 07/12/21 0527 07/12/21 0804       Assessment/Plan: Problem List: Patient Active Problem List   Diagnosis Date Noted   Hiatal hernia 07/12/2021   Status post laparoscopic  Nissen fundoplication 05/01/2021   IBS (irritable bowel syndrome) 07/27/2016   High-tone pelvic floor dysfunction 05/09/2016   Midline cystocele 05/09/2016   Vaginal atrophy 05/09/2016   Bladder prolapse, female, acquired 05/07/2016   Dystrophic nail 04/29/2016   Fatigue 04/27/2016   Sleep apnea 04/27/2016   UTI (urinary tract infection) 09/11/2015   Cataract cortical, senile, left 12/20/2014   Rectal lesion 10/10/2014   Other type of migraine without status migrainosus 05/06/2014   Dyspepsia 05/06/2014   Postmenopausal estrogen deficiency 05/06/2014   Screening for breast cancer 05/06/2014   Bilateral low-tension glaucoma, indeterminate stage 12/29/2013   High myopia, bilateral 12/29/2013   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   Obesity 11/01/2013   Eustachian tube dysfunction 10/08/2013   Otalgia of left ear 10/08/2013   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   Arthritis 07/24/2013   Anxiety attack 03/08/2013   Paresthesia 12/22/2012   Headache(784.0) 08/17/2012   BCC (basal cell carcinoma of skin) 06/01/2012   Left arm pain 05/10/2012   Right knee pain 05/10/2012   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   ADD (attention deficit disorder) 04/13/2012   Perimenopausal 01/16/2012   Macular pucker of both eyes 10/18/2011   Vitreous degeneration 10/18/2011   Anemia 10/10/2011   Preventative health care 10/10/2011   Urticaria, chronic 10/10/2011   Macular pucker, bilateral 10/10/2011   Lower back pain    DIVERTICULITIS-COLON 07/25/2009   Diverticulitis of colon 07/25/2009   Essential hypertension 06/09/2009   HELICOBACTER PYLORI GASTRITIS, HX OF 09/06/2008    GERD 07/27/2008   Bipolar disorder (HCC) 01/17/2007   Hypothyroidism 08/24/2006   Allergic rhinitis 08/24/2006   URINARY INCONTINENCE 08/24/2006   Assessment and Plan: Joan Robinson is a 61 year old female who underwent laparoscopic repair of an early hiatal hernia recurrence on 07/12/21.  Due to dense adhesions to mediastinal structures, the stomach was unable to be completely reduced, but it was straightened out and partially reduced and a G-tube was placed.    Dysphagia persists - unsure if this is postoperative or related to incomplete reduction of the stomach, needs more time to recover Start trickle tube feeds today TPN - continue TPN as patient unable to tolerate adequate oral intake due to dysphagia, will wean once tube feeds tolerated Acute blood loss anemia - vitals stable, Restarted prophylactic subcutaneous lovenox yesterday CXR with some improvement in airspace yesterday    4 Days Post-Op    LOS: 4 days   Quentin Ore, MD   [facility] Surgery - a [facility] [phone] to reach the surgeon on call.    07/16/2021 7:46 AM

## 2021-07-16 NOTE — Progress Notes (Signed)
PHARMACY - TOTAL PARENTERAL NUTRITION CONSULT NOTE   Indication:  bowel rest s/p Nissen and hiatal hernia repair. Inability to take PO  Patient Measurements: Height: '5\' 6"'$  (167.6 cm) Weight: 75 kg (165 lb 5.5 oz) IBW/kg (Calculated) : 59.3 TPN AdjBW (KG): 63.2 Body mass index is 26.69 kg/m.  Assessment:  61 yo F presented ~9 weeks post Nissen and hiatal hernia repair with a recurrent hernia with obstruction. Also with dysphagia and esopagitis. Has been on TPN since 6/16.  Glucose / Insulin: No hx of DM.  CBGs ok with minimal SSI use.   Electrolytes: wnl, K increased to 4.4 Renal: SCr wnl Hepatic: LFTs slightly elevated. Tbili wnl. Albumin 2.8 Intake / Output; MIVF: Drain output dec to 95 mL UOP not documented.  - RN reports adequate voiding, but mixed with loose stools.   GI Imaging: Xray shows stable hiatal hernia GI Surgeries / Procedures: 6/28 Lap with foregut dissection, partial stomach reduction, gastropexy, and gastrostomy tube placement.  Central access: PICC TPN start date: PTA 6/16 >>  Nutritional Goals: Inpatient Goal TPN rate is 80 mL/hr (provides 109 g of protein and 1,890 kcals per day) PTA TPN goal:  72m/hr (provides 110g of protein and 1,857 kcal per day)  RD Assessment: Estimated Needs Total Energy Estimated Needs: 1900-2100 kcal Total Protein Estimated Needs: 100-115 grams Total Fluid Estimated Needs: >/= 2 L/day  Current Nutrition:  Full liquids and TPN - Dysphagia continues to limit oral intake - G-tube: begin trickle tube feeds.  Vital HP @ 20 ml/hr  Plan:  Continue TPN at goal rate, 80 mL/hr  Electrolytes in TPN: Na 50 mEq/L, K 388m/L, Ca 73m27mL, Mg 73mE87m, and Phos 173mm51m. Cl:Ac 1:1 Add standard MVI and trace elements to TPN Continue Sensitive q6h SSI and adjust as needed  IVF: continue NS at 40 mL/hr (total IVF rate 120 ml/hr per MD) Monitor TPN labs on Mon/Thurs Follow up ability to wean TPN when tolerating tube feeds.     ChrisGretta ArabmD, BCPS Clinical Pharmacist [facility] main pharmacy [phone] 9:18 AM

## 2021-07-17 LAB — COMPREHENSIVE METABOLIC PANEL
ALT: 28 U/L (ref 0–44)
AST: 15 U/L (ref 15–41)
Albumin: 2.9 g/dL — ABNORMAL LOW (ref 3.5–5.0)
Alkaline Phosphatase: 61 U/L (ref 38–126)
Anion gap: 6 (ref 5–15)
BUN: 22 mg/dL — ABNORMAL HIGH (ref 6–20)
CO2: 22 mmol/L (ref 22–32)
Calcium: 8.4 mg/dL — ABNORMAL LOW (ref 8.9–10.3)
Chloride: 108 mmol/L (ref 98–111)
Creatinine, Ser: 0.57 mg/dL (ref 0.44–1.00)
GFR, Estimated: >60 mL/min (ref >60)
Glucose, Bld: 108 mg/dL — ABNORMAL HIGH (ref 70–99)
Potassium: 4.1 mmol/L (ref 3.5–5.1)
Sodium: 136 mmol/L (ref 135–145)
Total Bilirubin: 0.5 mg/dL (ref 0.3–1.2)
Total Protein: 5.7 g/dL — ABNORMAL LOW (ref 6.5–8.1)

## 2021-07-17 LAB — MAGNESIUM: Magnesium: 2.1 mg/dL (ref 1.7–2.4)

## 2021-07-17 LAB — GLUCOSE, CAPILLARY
Glucose-Capillary: 148 mg/dL — ABNORMAL HIGH (ref 70–99)
Glucose-Capillary: 117 mg/dL — ABNORMAL HIGH (ref 70–99)

## 2021-07-17 LAB — TRIGLYCERIDES: Triglycerides: 72 mg/dL (ref <150)

## 2021-07-17 LAB — PHOSPHORUS: Phosphorus: 4.6 mg/dL (ref 2.5–4.6)

## 2021-07-17 MED ORDER — HYDROCODONE-ACETAMINOPHEN 5-325 MG PO TABS: 1.0000 | ORAL_TABLET | Freq: Four times a day (QID) | ORAL | 0 refills | Status: DC | PRN

## 2021-07-17 MED ORDER — TRAVASOL 10 % IV SOLN
INTRAVENOUS | Status: DC
  Filled 2021-07-17: qty 1094.4

## 2021-07-17 MED ORDER — HEPARIN SOD (PORK) LOCK FLUSH 100 UNIT/ML IV SOLN
250.0000 [IU] | INTRAVENOUS | Status: AC | PRN
  Administered 2021-07-17: 250 [IU]

## 2021-07-17 MED ORDER — ONDANSETRON HCL 4 MG PO TABS: 4.0000 mg | ORAL_TABLET | Freq: Every day | ORAL | 1 refills | Status: AC | PRN

## 2021-07-17 NOTE — Progress Notes (Signed)
Discharge instructions discussed with patient and family, including JP care and GTube care

## 2021-07-17 NOTE — Progress Notes (Signed)
PHARMACY - TOTAL PARENTERAL NUTRITION CONSULT NOTE   Indication:  bowel rest s/p Nissen and hiatal hernia repair. Inability to take PO  Patient Measurements: Height: '5\' 6"'$  (167.6 cm) Weight: 75 kg (165 lb 5.5 oz) IBW/kg (Calculated) : 59.3 TPN AdjBW (KG): 63.2 Body mass index is 26.69 kg/m.  Assessment:  61 yo F presented ~9 weeks post Nissen and hiatal hernia repair with a recurrent hernia with obstruction. Also with dysphagia and esopagitis. Has been on TPN since 6/16.  Glucose / Insulin: No hx of DM.  CBGs ok with minimal SSI use.   Electrolytes: wnl Renal: SCr wnl Hepatic: LFTs wnl (slight elevation of AST and ALT has resolved. Albumin 2.9 Intake / Output; MIVF: Drain output dec to 70 mL / 24 hr. UOP not quantitated.   GI Imaging: Xray showed stable hiatal hernia GI Surgeries / Procedures: 6/28 Lap with foregut dissection, partial stomach reduction, gastropexy, and gastrostomy tube placement.  Central access: PICC TPN start date: PTA 6/16 >>  Nutritional Goals: Inpatient Goal TPN rate is 80 mL/hr (provides 109 g of protein and 1,890 kcals per day) PTA TPN goal:  21m/hr (provides 110g of protein and 1,857 kcal per day)  RD Assessment: Estimated Needs Total Energy Estimated Needs: 1900-2100 kcal Total Protein Estimated Needs: 100-115 grams Total Fluid Estimated Needs: >/= 2 L/day  Current Nutrition:  Full liquids and TPN - Dysphagia continues to limit oral intake - G-tube: began trickle tube feeds 7/2:  Vital HP @ 20 ml/hr  Plan:  Continue TPN at goal rate, 80 mL/hr  Electrolytes in TPN: Na 50 mEq/L, K 386m/L, Ca 64m43mL, Mg 64mE41m, and Phos 164mm77m. Cl:Ac 1:1 Add standard MVI and trace elements to TPN Continue Sensitive q6h SSI and adjust as needed  IVF: continue NS at 40 mL/hr (total IVF rate 120 ml/hr per MD) Monitor TPN labs on Mon/Thurs Follow up ability to wean TPN when tolerating tube feeds.    RandyClayburn PertrmD, BCPS [phone]   7:18 AM

## 2021-07-17 NOTE — Progress Notes (Signed)
Nutrition Follow-up  INTERVENTION:   -TPN management per Pharmacy  -Can discontinue Vital HP @ 20 ml/hr upon discharge  NUTRITION DIAGNOSIS:   Inadequate oral intake related to inability to eat as evidenced by NPO status.  Ongoing.  GOAL:   Patient will meet greater than or equal to 90% of their needs  Meeting with TPN  MONITOR:   Labs, Weight trends, I & O's, Other (Comment) (TPN regimen)  REASON FOR ASSESSMENT:   Consult Enteral/tube feeding initiation and management  ASSESSMENT:   61 y.o. female with medical history of diverticulosis, hypothyroidism, PUD, HTN, HLD, anemia, anxiety, depression, Freiberg's disease, basal cell carcinoma of the skin, bipolar disorder, arthritis, sleep apnea, IBW, GERD, emphysema, Hashimoto's disease. She underwent robotic hiatal hernia repair and has been on home TPN since 06/30/21. She returned to the hospital due to recurrent hiatal hernia with partial obstruction and on 6/28 underwent laparoscopy, upper endoscopy, extensive foregut dissection of the stomach.  6/28: admitted, s/p   Laparoscopy, upper endoscopy, extensive foregut dissection of the stomach above the chest taking down the herniated stomach principally in the left chest  Per RN, plan is for patient to discharge on home TPN. Will not use G-tube for feeding at this time.  Pt states she has not had a solid consistent meal intake since January 2023. Had nissen fundoplication surgery in April 2023. Has had trouble with swallowing since then, like foods gets stuck.   TPN continues to run at goal rate of 80 ml/hr. Providing 1890 kcals and 109g protein.  Admission weight:165 lbs. No other weights this admission.  Medications reviewed.  Labs reviewed: CBGs: 111-148  Diet Order:   Diet Order             Diet full liquid Room service appropriate? Yes; Fluid consistency: Thin  Diet effective now                   EDUCATION NEEDS:   No education needs have been  identified at this time  Skin:  Skin Assessment: Skin Integrity Issues: Skin Integrity Issues:: Incisions Incisions: abdomen (6/28)  Last BM:  PTA/unknown  Height:   Ht Readings from Last 1 Encounters:  07/12/21 '5\' 6"'$  (1.676 m)    Weight:   Wt Readings from Last 1 Encounters:  07/12/21 75 kg    Ideal Body Weight:  59.1 kg  BMI:  Body mass index is 26.69 kg/m.  Estimated Nutritional Needs:   Kcal:  1900-2100 kcal  Protein:  100-115 grams  Fluid:  >/= 2 L/day   Clayton Bibles, MS, RD, LDN Inpatient Clinical Dietitian Contact information available via Amion

## 2021-07-17 NOTE — Discharge Summary (Addendum)
Physician Discharge Summary  Patient ID: Joan Robinson MRN: [id number] DOB/AGE: [birth year] 61 y.o.  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  Drain placed  Discharged Condition: improved but not asymptomatic  Hospital Course:   Surgery was 5 hours long last Wednesday. Acute blood loss anemia seen in AM labs when Hg dropped.  No definite site of bleeding.  Drain left up across the diaphragm.  G tube in place.    Consults: pharmacy for TNA  Significant Diagnostic Studies: UGI    Discharge Exam: Blood pressure 110/79, pulse 98, temperature 98.3 F (36.8 C), temperature source Oral, resp. rate 20, height 5\' 6"  (1.676 m), weight 75 kg, SpO2 95 %. JP in place, serosanguinous.  PIC line in place for home TNA  Disposition: Discharge disposition: 01-Home or Self Care       Discharge Instructions     Call MD for:  redness, tenderness, or signs of infection (pain, swelling, redness, odor or green/yellow discharge around incision site)   Complete by: As directed    Discharge instructions   Complete by: As directed    Drain JP drain every day.  Leave G tube capped.   Discharge wound care:   Complete by: As directed    OK to shower with assistance.   Increase activity slowly   Complete by: As directed       Allergies as of 07/17/2021       Reactions   Aspirin Shortness Of Breath   Nsaids Shortness Of Breath   Keflex [cephalexin] Diarrhea   Butamben-tetracaine-benzocaine    Mouth sores   Dicyclomine Hcl    REACTION: mouth ulcers   Metronidazole Hives   mouth ulcers   Phenergan [promethazine Hcl] Other (See Comments)   "knocks" pt out for about 3 days    Quetiapine    Somnolence. Slept for 36 hours straight.   Benay Spice [lifitegrast]    Burned eyes   Cymbalta [duloxetine Hcl] Rash   Levothyroxine Palpitations   Pt must use brand SYNTHROID   Moxifloxacin Nausea Only, Palpitations   REACTION: increased heart rate   Moxifloxacin Hcl In Nacl Palpitations   Orphenadrine Citrate Palpitations   Sulfamethoxazole-trimethoprim Nausea Only, Palpitations   REACTION: increased heart rate, nausea        Medication List     STOP taking these medications    5-HTP 100 MG Caps   Co Q-10 200 MG Caps   conjugated estrogens 0.625 MG/GM vaginal cream Commonly known as: Premarin   lisdexamfetamine 70 MG capsule Commonly known as: VYVANSE   magnesium gluconate 500 MG tablet Commonly known as: MAGONATE   nitroGLYCERIN 0.4 MG SL tablet Commonly known as: NITROSTAT   PROBIOTIC-10 PO   sucralfate 1 GM/10ML suspension Commonly known as: CARAFATE   topiramate 25 MG tablet Commonly known as: TOPAMAX   Vitamin D3 50 MCG (2000 UT) Tabs       TAKE these medications    albuterol 108 (90 Base) MCG/ACT inhaler Commonly known as: VENTOLIN HFA Inhale 2 puffs into the lungs every 6 (six) hours as needed for wheezing or shortness of breath.   HYDROcodone-acetaminophen 5-325 MG tablet Commonly known as: NORCO/VICODIN Take 1 tablet by mouth every 6 (  six) hours as needed for moderate pain. What changed: Another medication with the same name was removed. Continue taking this medication, and follow the directions you see here.   latanoprost 0.005 % ophthalmic solution Commonly known as: XALATAN Place 1 drop into both eyes at bedtime.   levothyroxine 100 MCG tablet Commonly known as: SYNTHROID Take 1 tablet (100 mcg total) by mouth daily before breakfast.   metoprolol tartrate 25 MG tablet Commonly known as: LOPRESSOR TAKE 1/2 TABLET BY MOUTH DAILY   ondansetron 4 MG tablet Commonly known as: ZOFRAN Take 1 tablet (4 mg total) by mouth every  8 (eight) hours as needed for nausea or vomiting. Take 1-2 tablets every 4-6 hours as needed for nausea What changed: Another medication with the same name was added. Make sure you understand how and when to take each.   ondansetron 4 MG tablet Commonly known as: Zofran Take 1 tablet (4 mg total) by mouth daily as needed for nausea or vomiting. What changed: You were already taking a medication with the same name, and this prescription was added. Make sure you understand how and when to take each.   pantoprazole 40 MG tablet Commonly known as: PROTONIX Take 1 tablet (40 mg total) by mouth 2 (two) times daily before a meal.   zinc gluconate 50 MG tablet Take 1 tablet (50 mg total) by mouth daily.               Discharge Care Instructions  (From admission, onward)           Start     Ordered   07/17/21 0000  Discharge wound care:       Comments: OK to shower with assistance.   07/17/21 1452            Follow-up Information     Luretha Murphy, MD. Call in 1 week(s).   Specialty: General Surgery Why: For JP drain removal Contact information: [address] [place] [address] [phone]                 Signed: Valarie Merino 07/17/2021, 4:29 PM

## 2021-08-11 ENCOUNTER — Other Ambulatory Visit: Payer: Self-pay

## 2021-08-11 ENCOUNTER — Emergency Department (HOSPITAL_BASED_OUTPATIENT_CLINIC_OR_DEPARTMENT_OTHER): Payer: BC Managed Care – PPO

## 2021-08-11 ENCOUNTER — Emergency Department (HOSPITAL_BASED_OUTPATIENT_CLINIC_OR_DEPARTMENT_OTHER)
Admission: EM | Admit: 2021-08-11 | Discharge: 2021-08-11 | Disposition: A | Discharge status: Home / Self Care | Payer: BC Managed Care – PPO | Attending: Emergency Medicine | Admitting: Emergency Medicine

## 2021-08-11 DIAGNOSIS — R109 Unspecified abdominal pain: Secondary | ICD-10-CM | POA: Insufficient documentation

## 2021-08-11 DIAGNOSIS — R197 Diarrhea, unspecified: Secondary | ICD-10-CM | POA: Insufficient documentation

## 2021-08-11 DIAGNOSIS — R112 Nausea with vomiting, unspecified: Secondary | ICD-10-CM | POA: Diagnosis present

## 2021-08-11 DIAGNOSIS — R110 Nausea: Secondary | ICD-10-CM

## 2021-08-11 LAB — COMPREHENSIVE METABOLIC PANEL
ALT: 40 U/L (ref 0–44)
AST: 19 U/L (ref 15–41)
Albumin: 4.1 g/dL (ref 3.5–5.0)
Alkaline Phosphatase: 82 U/L (ref 38–126)
Anion gap: 11 (ref 5–15)
BUN: 27 mg/dL — ABNORMAL HIGH (ref 6–20)
CO2: 26 mmol/L (ref 22–32)
Calcium: 9.2 mg/dL (ref 8.9–10.3)
Chloride: 101 mmol/L (ref 98–111)
Creatinine, Ser: 0.61 mg/dL (ref 0.44–1.00)
GFR, Estimated: >60 mL/min (ref >60)
Glucose, Bld: 130 mg/dL — ABNORMAL HIGH (ref 70–99)
Potassium: 4.7 mmol/L (ref 3.5–5.1)
Sodium: 138 mmol/L (ref 135–145)
Total Bilirubin: 0.6 mg/dL (ref 0.3–1.2)
Total Protein: 6.6 g/dL (ref 6.5–8.1)

## 2021-08-11 LAB — CBC WITH DIFFERENTIAL/PLATELET
Abs Immature Granulocytes: 0.01 10*3/uL (ref 0.00–0.07)
Basophils Absolute: 0 10*3/uL (ref 0.0–0.1)
Basophils Relative: 0 %
Eosinophils Absolute: 0.1 10*3/uL (ref 0.0–0.5)
Eosinophils Relative: 1 %
HCT: 31.5 % — ABNORMAL LOW (ref 36.0–46.0)
Hemoglobin: 10.4 g/dL — ABNORMAL LOW (ref 12.0–15.0)
Immature Granulocytes: 0 %
Lymphocytes Relative: 16 %
Lymphs Abs: 0.8 10*3/uL (ref 0.7–4.0)
MCH: 29.7 pg (ref 26.0–34.0)
MCHC: 33 g/dL (ref 30.0–36.0)
MCV: 90 fL (ref 80.0–100.0)
Monocytes Absolute: 0.6 10*3/uL (ref 0.1–1.0)
Monocytes Relative: 12 %
Neutro Abs: 3.4 10*3/uL (ref 1.7–7.7)
Neutrophils Relative %: 71 %
Platelets: 152 10*3/uL (ref 150–400)
RBC: 3.5 MIL/uL — ABNORMAL LOW (ref 3.87–5.11)
RDW: 13.2 % (ref 11.5–15.5)
WBC: 4.8 10*3/uL (ref 4.0–10.5)
nRBC: 0 % (ref 0.0–0.2)

## 2021-08-11 LAB — LIPASE, BLOOD: Lipase: <10 U/L — ABNORMAL LOW (ref 11–51)

## 2021-08-11 MED ORDER — IOHEXOL 300 MG/ML  SOLN
100.0000 mL | Freq: Once | INTRAMUSCULAR | Status: AC | PRN
  Administered 2021-08-11: 100 mL via INTRAVENOUS

## 2021-08-11 MED ORDER — SODIUM CHLORIDE 0.9 % IV BOLUS
1000.0000 mL | Freq: Once | INTRAVENOUS | Status: AC
  Administered 2021-08-11: 1000 mL via INTRAVENOUS

## 2021-08-11 MED ORDER — ONDANSETRON HCL 4 MG/2ML IJ SOLN
4.0000 mg | Freq: Once | INTRAMUSCULAR | Status: AC
  Administered 2021-08-11: 4 mg via INTRAVENOUS
  Filled 2021-08-11: qty 2

## 2021-08-11 NOTE — ED Provider Notes (Signed)
[facility] DEPT Provider Note   CSN: [id number] Arrival date & time: 08/11/21  1008     History  Chief Complaint  Patient presents with   Fever   Emesis    Joan Robinson is a 61 y.o. female.  Patient here with nausea and vomiting, abdominal pain.  Recent hiatal hernia surgery repair.  She has a G-tube in place to help keep her stomach in place she states.  She is gotten nutrition through a PICC line for TPN.  She has been allowed to have some fluids by mouth but she has not really tolerated anything orally since the surgery.  She supposed to have a second process of the surgery at some point as well.  She is having some general discomfort in her abdomen.  She denies any chest pain, shortness of breath.  She is felt generally weak at times.  She had a home health nurse come out today and noticed that her blood pressure was low.  She had a temperature of 99.  She has not felt feverish.  She had a lot of diarrhea as well.  She is not getting good relief from her nausea and vomiting from Zofran at home.  The history is provided by the patient.       Home Medications Prior to Admission medications   Medication Sig Start Date End Date Taking? Authorizing Provider  albuterol (PROVENTIL HFA;VENTOLIN HFA) 108 (90 Base) MCG/ACT inhaler Inhale 2 puffs into the lungs every 6 (six) hours as needed for wheezing or shortness of breath. 05/19/15   Saguier, Percell Miller, PA-C  HYDROcodone-acetaminophen (NORCO/VICODIN) 5-325 MG tablet Take 1 tablet by mouth every 6 (six) hours as needed for moderate pain. 07/17/21   Johnathan Hausen, MD  latanoprost (XALATAN) 0.005 % ophthalmic solution Place 1 drop into both eyes at bedtime. 02/06/16   [provider]  levothyroxine (SYNTHROID, LEVOTHROID) 100 MCG tablet Take 1 tablet (100 mcg total) by mouth daily before breakfast. 05/31/16   Mosie Lukes, MD  metoprolol tartrate (LOPRESSOR) 25 MG tablet TAKE 1/2 TABLET BY MOUTH DAILY 10/14/18    Mosie Lukes, MD  ondansetron (ZOFRAN) 4 MG tablet Take 1 tablet (4 mg total) by mouth every 8 (eight) hours as needed for nausea or vomiting. Take 1-2 tablets every 4-6 hours as needed for nausea 05/03/21   Johnathan Hausen, MD  ondansetron (ZOFRAN) 4 MG tablet Take 1 tablet (4 mg total) by mouth daily as needed for nausea or vomiting. 07/17/21 07/17/22  Johnathan Hausen, MD  pantoprazole (PROTONIX) 40 MG tablet Take 1 tablet (40 mg total) by mouth 2 (two) times daily before a meal. 04/05/21   Irene Shipper, MD  zinc gluconate 50 MG tablet Take 1 tablet (50 mg total) by mouth daily. Patient not taking: Reported on 04/11/2021 07/27/16   Mosie Lukes, MD      Allergies    Aspirin, Nsaids, Keflex [cephalexin], Butamben-tetracaine-benzocaine, Dicyclomine hcl, Metronidazole, Phenergan [promethazine hcl], Quetiapine, Xiidra [lifitegrast], Cymbalta [duloxetine hcl], Levothyroxine, Moxifloxacin, Moxifloxacin hcl in nacl, Orphenadrine citrate, and Sulfamethoxazole-trimethoprim    Review of Systems   Review of Systems  Physical Exam Updated Vital Signs BP 120/70 (BP Location: Left Arm)   Pulse 83   Temp 98.4 F (36.9 C) (Oral)   Resp (!) 23   Ht '5\' 6"'$  (1.676 m)   Wt 76.7 kg   SpO2 100%   BMI 27.28 kg/m  Physical Exam Vitals and nursing note reviewed.  Constitutional:  General: She is not in acute distress.    Appearance: She is well-developed. She is not ill-appearing.  HENT:     Head: Normocephalic and atraumatic.     Nose: Nose normal.     Mouth/Throat:     Mouth: Mucous membranes are dry.  Eyes:     Extraocular Movements: Extraocular movements intact.     Conjunctiva/sclera: Conjunctivae normal.     Pupils: Pupils are equal, round, and reactive to light.  Cardiovascular:     Rate and Rhythm: Normal rate and regular rhythm.     Pulses: Normal pulses.     Heart sounds: Normal heart sounds. No murmur heard. Pulmonary:     Effort: Pulmonary effort is normal. No respiratory  distress.     Breath sounds: Normal breath sounds.  Abdominal:     Palpations: Abdomen is soft.     Tenderness: There is abdominal tenderness.  Musculoskeletal:        General: No swelling.     Cervical back: Neck supple.  Skin:    General: Skin is warm and dry.     Capillary Refill: Capillary refill takes less than 2 seconds.  Neurological:     Mental Status: She is alert.  Psychiatric:        Mood and Affect: Mood normal.     ED Results / Procedures / Treatments   Labs (all labs ordered are listed, but only abnormal results are displayed) Labs Reviewed  CBC WITH DIFFERENTIAL/PLATELET - Abnormal; Notable for the following components:      Result Value   RBC 3.50 (*)    Hemoglobin 10.4 (*)    HCT 31.5 (*)    All other components within normal limits  COMPREHENSIVE METABOLIC PANEL - Abnormal; Notable for the following components:   Glucose, Bld 130 (*)    BUN 27 (*)    All other components within normal limits  LIPASE, BLOOD - Abnormal; Notable for the following components:   Lipase <10 (*)    All other components within normal limits    EKG None  Radiology CT ABDOMEN PELVIS W CONTRAST  Result Date: 08/11/2021 CLINICAL DATA:  Abdominal pain, acute. Status post hiatal hernia surgery. Nausea and vomiting. EXAM: CT ABDOMEN AND PELVIS WITH CONTRAST TECHNIQUE: Multidetector CT imaging of the abdomen and pelvis was performed using the standard protocol following bolus administration of intravenous contrast. RADIATION DOSE REDUCTION: This exam was performed according to the departmental dose-optimization program which includes automated exposure control, adjustment of the mA and/or kV according to patient size and/or use of iterative reconstruction technique. CONTRAST:  123m OMNIPAQUE IOHEXOL 300 MG/ML  SOLN COMPARISON:  CT examination dated Jun 08, 2021 FINDINGS: Lower chest: Left basilar atelectasis. Persistent large hiatal hernia. Hepatobiliary: 7 mm hypodense structure in the  right hepatic lobe likely a cyst, unchanged. Cholelithiasis without evidence of gallbladder wall thickening or pericholecystic inflammatory changes. Pancreas: Mild pancreatic atrophy. No pancreatic ductal dilatation or surrounding inflammatory changes. Spleen: Normal in size without focal abnormality. Adrenals/Urinary Tract: Adrenal glands are unremarkable. Kidneys are normal, without renal calculi, focal lesion, or hydronephrosis. Small subcentimeter simple cyst in the upper pole of the left kidney. Bladder is unremarkable. Stomach/Bowel: Large hiatal hernia, unchanged. Percutaneous gastrostomy tube in place. Bowel loops are normal in caliber. Sigmoid colonic diverticulosis without evidence of acute diverticulitis. Vascular/Lymphatic: Aortic atherosclerosis. No enlarged abdominal or pelvic lymph nodes. Reproductive: Status post hysterectomy. No adnexal masses. Other: No abdominal wall hernia. Mild edema along the gastrostomy tube without evidence of  fluid collection or abscess. No abdominopelvic ascites. Musculoskeletal: Degenerate disc disease of the lumbar spine prominent at L5-S1. No acute osseous abnormality. IMPRESSION: 1.  Persistent large hiatal hernia. 2.  Percutaneous gastrostomy tube in place. 3.  Bowel loops are normal in caliber.  No evidence of obstruction. 4. Sigmoid colonic diverticulosis without evidence of acute diverticulitis. 5. No acute abdominal/pelvic process or significant interval change. Electronically Signed   By: Keane Police D.O.   On: 08/11/2021 12:04    Procedures Procedures    Medications Ordered in ED Medications  sodium chloride 0.9 % bolus 1,000 mL (1,000 mLs Intravenous New Bag/Given 08/11/21 1053)  ondansetron (ZOFRAN) injection 4 mg (4 mg Intravenous Given 08/11/21 1053)  iohexol (OMNIPAQUE) 300 MG/ML solution 100 mL (100 mLs Intravenous Contrast Given 08/11/21 1137)    ED Course/ Medical Decision Making/ A&P                           Medical Decision Making Amount  and/or Complexity of Data Reviewed Labs: ordered. Radiology: ordered.  Risk Prescription drug management.   Jomarie Longs Caldron is here with abdominal pain, nausea, vomiting, diarrhea.  Recent hiatal hernia surgery repair.  Has a G-tube in place that is well-appearing.  Surgical sites are clean dry and intact.  No signs of infection around G-tube site.  She is getting TPN from a PICC line as her main nutrition.  She has not been able to tolerate much orally.  She is having nausea and vomiting some abdominal discomfort at times.  She is having diarrhea at times.  Home health nurse thought she was dehydrated, thought she had low blood pressure.  Temperature 99.  However vital signs here are normal.  No fever.  Discussed some general discomfort in her abdomen.  Overall her main concern is about dehydration, uncontrollable nausea and vomiting.  She feels very nauseous but due to the surgery she has not been able to throw up.  Differential diagnosis is dehydration, postop complication, electrolyte abnormality.  Less likely infectious process.  We will get CBC, CMP, lipase, urinalysis, give IV fluids, IV Zofran and CT scan abdomen pelvis.  Per my review and interpretation of labs is no significant anemia, electrolyte abnormality, kidney injury, leukocytosis.  CT scan of abdomen and pelvis per radiology report shows no acute findings.  Overall patient feeling better after IV fluids and IV antiemetics.  Discharged in good condition.  Has follow-up with surgery next week.  This chart was dictated using voice recognition software.  Despite best efforts to proofread,  errors can occur which can change the documentation meaning.         Final Clinical Impression(s) / ED Diagnoses Final diagnoses:  Nausea    Rx / DC Orders ED Discharge Orders     None         Lennice Sites, DO 08/11/21 1213

## 2021-08-11 NOTE — ED Notes (Signed)
Patient Alert and oriented to baseline. Stable and ambulatory to baseline. Patient verbalized understanding of the discharge instructions.  Patient belongings were taken by the patient.   

## 2021-08-11 NOTE — ED Triage Notes (Addendum)
Patient arrives with complaints of worsening vomiting, low-grade fever (99), and hypotension. Patient recently had stomach surgery and she is receiving home health (TPN treatment). Patient was sent her by her home health rep for further evaluation. Reports diarrhea as well.   G-tube in place is not for feeding, but to hold her stomach in place -per the patient.

## 2021-08-16 ENCOUNTER — Other Ambulatory Visit: Payer: Self-pay | Admitting: Surgery

## 2021-08-16 ENCOUNTER — Ambulatory Visit
Admission: RE | Admit: 2021-08-16 | Discharge: 2021-08-16 | Disposition: A | Discharge status: Home / Self Care | Payer: BC Managed Care – PPO | Source: Ambulatory Visit | Attending: Surgery | Admitting: Surgery

## 2021-08-16 DIAGNOSIS — R112 Nausea with vomiting, unspecified: Secondary | ICD-10-CM

## 2021-08-21 ENCOUNTER — Other Ambulatory Visit (HOSPITAL_COMMUNITY): Payer: Self-pay | Admitting: Surgery

## 2021-08-21 DIAGNOSIS — K219 Gastro-esophageal reflux disease without esophagitis: Secondary | ICD-10-CM

## 2021-08-21 DIAGNOSIS — R112 Nausea with vomiting, unspecified: Secondary | ICD-10-CM

## 2021-08-23 ENCOUNTER — Ambulatory Visit (HOSPITAL_COMMUNITY)
Admission: RE | Admit: 2021-08-23 | Discharge: 2021-08-23 | Disposition: A | Discharge status: Home / Self Care | Payer: BC Managed Care – PPO | Source: Ambulatory Visit | Attending: Surgery | Admitting: Surgery

## 2021-08-23 ENCOUNTER — Other Ambulatory Visit (HOSPITAL_COMMUNITY): Payer: Self-pay | Admitting: Surgery

## 2021-08-23 DIAGNOSIS — K219 Gastro-esophageal reflux disease without esophagitis: Secondary | ICD-10-CM | POA: Insufficient documentation

## 2021-08-23 DIAGNOSIS — R112 Nausea with vomiting, unspecified: Secondary | ICD-10-CM | POA: Insufficient documentation

## 2021-08-23 DIAGNOSIS — K449 Diaphragmatic hernia without obstruction or gangrene: Secondary | ICD-10-CM | POA: Diagnosis not present

## 2021-08-23 MED ORDER — LIDOCAINE HCL 1 % IJ SOLN
INTRAMUSCULAR | Status: AC
  Filled 2021-08-23: qty 20

## 2021-08-23 MED ORDER — IOHEXOL 300 MG/ML  SOLN
20.0000 mL | Freq: Once | INTRAMUSCULAR | Status: AC | PRN
  Administered 2021-08-23: 6 mL via INTRAVENOUS

## 2021-08-23 MED ORDER — HEPARIN SOD (PORK) LOCK FLUSH 100 UNIT/ML IV SOLN
INTRAVENOUS | Status: AC
  Filled 2021-08-23: qty 5

## 2021-08-23 MED ORDER — HEPARIN SOD (PORK) LOCK FLUSH 100 UNIT/ML IV SOLN
500.0000 [IU] | Freq: Once | INTRAVENOUS | Status: AC
Start: 2021-08-23 — End: 2021-08-23
  Administered 2021-08-23: 500 [IU] via INTRAVENOUS

## 2021-08-31 ENCOUNTER — Other Ambulatory Visit: Payer: Self-pay | Admitting: Surgery

## 2021-08-31 ENCOUNTER — Ambulatory Visit: Admission: RE | Admit: 2021-08-31 | Payer: BC Managed Care – PPO | Source: Ambulatory Visit

## 2021-08-31 DIAGNOSIS — R509 Fever, unspecified: Secondary | ICD-10-CM

## 2021-08-31 DIAGNOSIS — K449 Diaphragmatic hernia without obstruction or gangrene: Secondary | ICD-10-CM

## 2021-08-31 DIAGNOSIS — Z8719 Personal history of other diseases of the digestive system: Secondary | ICD-10-CM

## 2022-06-04 ENCOUNTER — Emergency Department (HOSPITAL_BASED_OUTPATIENT_CLINIC_OR_DEPARTMENT_OTHER): Payer: BC Managed Care – PPO

## 2022-06-04 ENCOUNTER — Emergency Department (HOSPITAL_BASED_OUTPATIENT_CLINIC_OR_DEPARTMENT_OTHER)
Admission: EM | Admit: 2022-06-04 | Discharge: 2022-06-04 | Disposition: A | Discharge status: Home / Self Care | Payer: BC Managed Care – PPO | Attending: Emergency Medicine | Admitting: Emergency Medicine

## 2022-06-04 ENCOUNTER — Encounter (HOSPITAL_BASED_OUTPATIENT_CLINIC_OR_DEPARTMENT_OTHER): Payer: Self-pay

## 2022-06-04 ENCOUNTER — Other Ambulatory Visit: Payer: Self-pay

## 2022-06-04 DIAGNOSIS — I10 Essential (primary) hypertension: Secondary | ICD-10-CM | POA: Diagnosis not present

## 2022-06-04 DIAGNOSIS — R002 Palpitations: Secondary | ICD-10-CM | POA: Insufficient documentation

## 2022-06-04 DIAGNOSIS — R42 Dizziness and giddiness: Secondary | ICD-10-CM | POA: Insufficient documentation

## 2022-06-04 DIAGNOSIS — E039 Hypothyroidism, unspecified: Secondary | ICD-10-CM | POA: Insufficient documentation

## 2022-06-04 DIAGNOSIS — Z79899 Other long term (current) drug therapy: Secondary | ICD-10-CM | POA: Diagnosis not present

## 2022-06-04 DIAGNOSIS — K5732 Diverticulitis of large intestine without perforation or abscess without bleeding: Secondary | ICD-10-CM | POA: Insufficient documentation

## 2022-06-04 DIAGNOSIS — R1011 Right upper quadrant pain: Secondary | ICD-10-CM | POA: Diagnosis present

## 2022-06-04 LAB — COMPREHENSIVE METABOLIC PANEL
ALT: 11 U/L (ref 0–44)
AST: 17 U/L (ref 15–41)
Albumin: 4.6 g/dL (ref 3.5–5.0)
Alkaline Phosphatase: 75 U/L (ref 38–126)
Anion gap: 9 (ref 5–15)
BUN: 15 mg/dL (ref 8–23)
CO2: 26 mmol/L (ref 22–32)
Calcium: 9.5 mg/dL (ref 8.9–10.3)
Chloride: 103 mmol/L (ref 98–111)
Creatinine, Ser: 0.95 mg/dL (ref 0.44–1.00)
GFR, Estimated: >60 mL/min (ref >60)
Glucose, Bld: 130 mg/dL — ABNORMAL HIGH (ref 70–99)
Potassium: 4.1 mmol/L (ref 3.5–5.1)
Sodium: 138 mmol/L (ref 135–145)
Total Bilirubin: 0.4 mg/dL (ref 0.3–1.2)
Total Protein: 7.1 g/dL (ref 6.5–8.1)

## 2022-06-04 LAB — URINALYSIS, ROUTINE W REFLEX MICROSCOPIC
Bilirubin Urine: NEGATIVE
Glucose, UA: NEGATIVE mg/dL
Hgb urine dipstick: NEGATIVE
Ketones, ur: NEGATIVE mg/dL
Nitrite: NEGATIVE
Protein, ur: NEGATIVE mg/dL
Specific Gravity, Urine: 1.01 (ref 1.005–1.030)
pH: 7 (ref 5.0–8.0)

## 2022-06-04 LAB — CBC
HCT: 34.5 % — ABNORMAL LOW (ref 36.0–46.0)
Hemoglobin: 11.3 g/dL — ABNORMAL LOW (ref 12.0–15.0)
MCH: 29 pg (ref 26.0–34.0)
MCHC: 32.8 g/dL (ref 30.0–36.0)
MCV: 88.7 fL (ref 80.0–100.0)
Platelets: 188 10*3/uL (ref 150–400)
RBC: 3.89 MIL/uL (ref 3.87–5.11)
RDW: 12.4 % (ref 11.5–15.5)
WBC: 4.1 10*3/uL (ref 4.0–10.5)
nRBC: 0 % (ref 0.0–0.2)

## 2022-06-04 LAB — TSH: TSH: 0.245 u[IU]/mL — ABNORMAL LOW (ref 0.350–4.500)

## 2022-06-04 LAB — LIPASE, BLOOD: Lipase: <10 U/L — ABNORMAL LOW (ref 11–51)

## 2022-06-04 MED ORDER — AMOXICILLIN-POT CLAVULANATE 875-125 MG PO TABS: 1.0000 | ORAL_TABLET | Freq: Two times a day (BID) | ORAL | 0 refills | Status: AC

## 2022-06-04 MED ORDER — IOHEXOL 300 MG/ML  SOLN
100.0000 mL | Freq: Once | INTRAMUSCULAR | Status: AC | PRN
  Administered 2022-06-04: 80 mL via INTRAVENOUS

## 2022-06-04 MED ORDER — AMOXICILLIN-POT CLAVULANATE 875-125 MG PO TABS
1.0000 | ORAL_TABLET | Freq: Once | ORAL | Status: AC
  Administered 2022-06-04: 1 via ORAL
  Filled 2022-06-04: qty 1

## 2022-06-04 MED ORDER — LACTATED RINGERS IV BOLUS
1000.0000 mL | Freq: Once | INTRAVENOUS | Status: AC
  Administered 2022-06-04: 1000 mL via INTRAVENOUS

## 2022-06-04 NOTE — ED Triage Notes (Signed)
Patient here POV from Home.  Endorses Palpitations and lightheadedness since Friday. Symptoms somewhat improved yesterday but then worsened today.   Some Pain to RUQ radiating to Lower ABD. Some nausea. No emesis. No Diarrhea. No Known fevers.   NAD noted during Triage. A&Ox4. GCS 15. Ambulatory.

## 2022-06-04 NOTE — ED Provider Notes (Signed)
[place] EMERGENCY DEPARTMENT AT [facility] Provider Note   CSN: [id number] Arrival date & time: 06/04/22  1644     History Chief Complaint  Patient presents with   Palpitations    Joan Robinson is a 62 y.o. female.  Patient with past history significant for diverticulitis, hypothyroidism, hypertension presents to the emergency department complaints of palpitations.  Patient also reports some associated right upper quadrant/right lower quadrant abdominal tenderness.  States that she began experiencing what felt palpitations started on Friday and has had some improvement in symptoms but worsened acutely today.  Denies any current episodes of palpitations.  Regarding abdominal pain, patient reports and feels that her right upper quadrant pain is radiating toward the right lower quadrant/lower abdomen.  Patient is currently being treated for UTI with Macrobid.  Does report some nausea but no emesis.  No diarrhea.  No fevers.   Palpitations      Home Medications Prior to Admission medications   Medication Sig Start Date End Date Taking? Authorizing Provider  amoxicillin-clavulanate (AUGMENTIN) 875-125 MG tablet Take 1 tablet by mouth every 12 (twelve) hours for 5 days. 06/04/22 06/09/22 Yes Smitty Knudsen, PA-C  nitrofurantoin, macrocrystal-monohydrate, (MACROBID) 100 MG capsule Take 100 mg by mouth 2 (two) times daily.   Yes [provider]  albuterol (PROVENTIL HFA;VENTOLIN HFA) 108 (90 Base) MCG/ACT inhaler Inhale 2 puffs into the lungs every 6 (six) hours as needed for wheezing or shortness of breath. 05/19/15   Saguier, Ramon Dredge, PA-C  HYDROcodone-acetaminophen (NORCO/VICODIN) 5-325 MG tablet Take 1 tablet by mouth every 6 (six) hours as needed for moderate pain. 07/17/21   Luretha Murphy, MD  latanoprost (XALATAN) 0.005 % ophthalmic solution Place 1 drop into both eyes at bedtime. 02/06/16   [provider]  levothyroxine (SYNTHROID, LEVOTHROID) 100 MCG tablet  Take 1 tablet (100 mcg total) by mouth daily before breakfast. Patient taking differently: Take 88 mcg by mouth daily before breakfast. 05/31/16   Bradd Canary, MD  metoprolol tartrate (LOPRESSOR) 25 MG tablet TAKE 1/2 TABLET BY MOUTH DAILY 10/14/18   Bradd Canary, MD  ondansetron (ZOFRAN) 4 MG tablet Take 1 tablet (4 mg total) by mouth every 8 (eight) hours as needed for nausea or vomiting. Take 1-2 tablets every 4-6 hours as needed for nausea 05/03/21   Luretha Murphy, MD  ondansetron (ZOFRAN) 4 MG tablet Take 1 tablet (4 mg total) by mouth daily as needed for nausea or vomiting. 07/17/21 07/17/22  Luretha Murphy, MD  pantoprazole (PROTONIX) 40 MG tablet Take 1 tablet (40 mg total) by mouth 2 (two) times daily before a meal. 04/05/21   Hilarie Fredrickson, MD  zinc gluconate 50 MG tablet Take 1 tablet (50 mg total) by mouth daily. Patient not taking: Reported on 04/11/2021 07/27/16   Bradd Canary, MD      Allergies    Aspirin, Nsaids, Keflex [cephalexin], Butamben-tetracaine-benzocaine, Dicyclomine hcl, Metronidazole, Phenergan [promethazine hcl], Quetiapine, Xiidra [lifitegrast], Cymbalta [duloxetine hcl], Levothyroxine, Moxifloxacin, Moxifloxacin hcl in nacl, Orphenadrine citrate, and Sulfamethoxazole-trimethoprim    Review of Systems   Review of Systems  Cardiovascular:  Positive for palpitations.  Gastrointestinal:  Positive for abdominal pain.  All other systems reviewed and are negative.   Physical Exam Updated Vital Signs BP 139/87   Pulse 74   Temp 98 F (36.7 C)   Resp 15   Ht 5\' 4"  (1.626 m)   Wt 72.1 kg   SpO2 99%   BMI 27.29 kg/m  Physical  Exam Vitals and nursing note reviewed.  Constitutional:      General: She is not in acute distress.    Appearance: She is well-developed.  HENT:     Head: Normocephalic and atraumatic.  Eyes:     Conjunctiva/sclera: Conjunctivae normal.  Cardiovascular:     Rate and Rhythm: Normal rate and regular rhythm.     Heart sounds: No  murmur heard. Pulmonary:     Effort: Pulmonary effort is normal. No respiratory distress.     Breath sounds: Normal breath sounds.  Abdominal:     Palpations: Abdomen is soft.     Tenderness: There is abdominal tenderness in the right upper quadrant, right lower quadrant, suprapubic area and left lower quadrant. There is no guarding.  Musculoskeletal:        General: No swelling.     Cervical back: Neck supple.  Skin:    General: Skin is warm and dry.     Capillary Refill: Capillary refill takes less than 2 seconds.  Neurological:     Mental Status: She is alert.  Psychiatric:        Mood and Affect: Mood normal.     ED Results / Procedures / Treatments   Labs (all labs ordered are listed, but only abnormal results are displayed) Labs Reviewed  LIPASE, BLOOD - Abnormal; Notable for the following components:      Result Value   Lipase <10 (*)    All other components within normal limits  COMPREHENSIVE METABOLIC PANEL - Abnormal; Notable for the following components:   Glucose, Bld 130 (*)    All other components within normal limits  CBC - Abnormal; Notable for the following components:   Hemoglobin 11.3 (*)    HCT 34.5 (*)    All other components within normal limits  URINALYSIS, ROUTINE W REFLEX MICROSCOPIC - Abnormal; Notable for the following components:   Leukocytes,Ua MODERATE (*)    Bacteria, UA RARE (*)    Non Squamous Epithelial 0-5 (*)    All other components within normal limits  TSH - Abnormal; Notable for the following components:   TSH 0.245 (*)    All other components within normal limits    EKG None  Radiology CT ABDOMEN PELVIS W CONTRAST  Result Date: 06/04/2022 CLINICAL DATA:  Right lower quadrant abdominal pain. EXAM: CT ABDOMEN AND PELVIS WITH CONTRAST TECHNIQUE: Multidetector CT imaging of the abdomen and pelvis was performed using the standard protocol following bolus administration of intravenous contrast. RADIATION DOSE REDUCTION: This exam  was performed according to the departmental dose-optimization program which includes automated exposure control, adjustment of the mA and/or kV according to patient size and/or use of iterative reconstruction technique. CONTRAST:  80mL OMNIPAQUE IOHEXOL 300 MG/ML  SOLN COMPARISON:  CT abdomen pelvis dated 08/11/2021. FINDINGS: Lower chest: There is mild eventration of the left hemidiaphragm with left lung base atelectasis. The visualized right lung base is clear. There is coronary vascular calcification. No intra-abdominal free air or free fluid. Hepatobiliary: Small cyst in the right lobe of the liver similar to prior CT. No biliary dilatation. Multiple small gallstones. No pericholecystic fluid or evidence of acute cholecystitis by CT. Pancreas: Unremarkable. No pancreatic ductal dilatation or surrounding inflammatory changes. Spleen: Normal in size without focal abnormality. Adrenals/Urinary Tract: The adrenal glands are unremarkable. Subcentimeter left renal upper pole hypodense focus is too small to characterize. There is no hydronephrosis on either side. There is symmetric enhancement and excretion of contrast by both kidneys. The visualized ureters  and urinary bladder appear unremarkable. Stomach/Bowel: There is sigmoid diverticulosis. There is mild inflammatory changes centered at a sigmoid diverticula in the right posterior pelvis (73/2 and coronal 24/5) consistent with acute diverticulitis. No diverticular abscess or perforation. There is no bowel obstruction or active inflammation. The appendix is not visualized with certainty. No inflammatory changes identified in the right lower quadrant. Vascular/Lymphatic: Mild aortoiliac atherosclerotic disease. The IVC is unremarkable. No portal venous gas. There is no adenopathy. Reproductive: Hysterectomy.  No adnexal masses. Other: None Musculoskeletal: Degenerative changes at L5-S1. No acute osseous pathology. IMPRESSION: 1. Sigmoid diverticulitis. No  diverticular abscess or perforation. 2. Cholelithiasis. 3.  Aortic Atherosclerosis (ICD10-I70.0). Electronically Signed   By: Elgie Collard M.D.   On: 06/04/2022 20:55    Procedures Procedures   Medications Ordered in ED Medications  lactated ringers bolus 1,000 mL (0 mLs Intravenous Stopped 06/04/22 2133)  iohexol (OMNIPAQUE) 300 MG/ML solution 100 mL (80 mLs Intravenous Contrast Given 06/04/22 2038)  amoxicillin-clavulanate (AUGMENTIN) 875-125 MG per tablet 1 tablet (1 tablet Oral Given 06/04/22 2320)    ED Course/ Medical Decision Making/ A&P                           Medical Decision Making Amount and/or Complexity of Data Reviewed Labs: ordered. Radiology: ordered.  Risk Prescription drug management.   This patient presents to the ED for concern of palpitations.  Differential diagnosis includes PVCs, atrial fibrillation, PE, bowel obstruction, UTI   Lab Tests:  I Ordered, and personally interpreted labs.  The pertinent results include: CBC and CMP unremarkable, lipase negative, TSH slightly improved, UA without obvious signs of infection   Imaging Studies ordered:  I ordered imaging studies including CT abdomen pelvis I independently visualized and interpreted imaging which showed sigmoid diverticulitis I agree with the radiologist interpretation   Medicines ordered and prescription drug management:  I ordered medication including Augmentin, fluids for diverticulitis Reevaluation of the patient after these medicines showed that the patient improved I have reviewed the patients home medicines and have made adjustments as needed   Problem List / ED Course:  Patient presents emergency department complaints of palpitations.  Patient reports she was experiencing palpitations and intermittent lightheadedness since Friday.  Patient has a baseline history of known palpitations easily aggravated by medications and the like.  Patient's EKG stable here in emergency  department showing normal sinus rhythm without any ectopic beats.  Patient has been taking Macrobid for UTI and reports symptoms have been somewhat improving but is reporting some lower abdominal tenderness with radiation into the right upper quadrant.  Given concerning findings for possible abdominal pathology, CT abdomen pelvis ordered which did not note diverticulitis in the sigmoid colon.  Advised patient of findings and will treat accordingly.  Given the patient has a rather extensive history of allergies or reactions to antibiotics, will not we will treat with preferred course of metronidazole and ciprofloxacin.  Will instead opt for Augmentin but did caution patient about possible diarrhea that this may cause.  Encourage patient to follow with primary care provider for further evaluation return for evaluation with cardiology to ensure that there is no acute concerns given increasing frequency of palpitations. Dose of Augmentin given in the emergency department prior to discharge.  On reassessment, patient does not appear to be in any acute distress and I believe she is safe for discharge at this time. Prescription sent to patient's pharmacy.  Patient agreeable with treatment plan verbalized.  Final Clinical Impression(s) / ED Diagnoses Final diagnoses:  Sigmoid diverticulitis  Palpitations    Rx / DC Orders ED Discharge Orders          Ordered    amoxicillin-clavulanate (AUGMENTIN) 875-125 MG tablet  Every 12 hours        06/04/22 2302              Smitty Knudsen, PA-C 06/04/22 2348    Mardene Sayer, MD 06/05/22 [phone]

## 2022-06-04 NOTE — ED Notes (Signed)
Patient to CT via stretcher.

## 2022-06-04 NOTE — ED Notes (Signed)
Patient brought from lobby to room 9.

## 2022-06-04 NOTE — Discharge Instructions (Addendum)
You were seen in the emergency department for palpitations. Your workup revealed evidence of diverticulitis in your sigmoid colon. You have received antibiotics for this to clear things up. If you feel that your symptoms are acutely worsening, please return to the ED for further evaluation or follow up with primary care provider.

## 2022-06-05 ENCOUNTER — Telehealth: Payer: Self-pay | Admitting: Internal Medicine

## 2022-06-05 NOTE — Telephone Encounter (Signed)
Pt was seen in the ER for diverticulitis and placed on Augmentin. Pt wanted to know if there was a special diet she should follow. Discussed with her she could do clear liquids for a few days and then follow a low residue diet. Pt verbalized understanding and has a f/u appt scheduled.

## 2022-06-05 NOTE — Telephone Encounter (Signed)
PT is looking for recommendations on a diet to ease her diverticulitis. Please advise.

## 2022-08-10 ENCOUNTER — Ambulatory Visit: Payer: BC Managed Care – PPO | Admitting: Podiatry

## 2022-08-10 ENCOUNTER — Encounter: Payer: Self-pay | Admitting: Podiatry

## 2022-08-10 DIAGNOSIS — L603 Nail dystrophy: Secondary | ICD-10-CM

## 2022-08-10 NOTE — Progress Notes (Signed)
Subjective:  Patient ID: Joan Robinson, female    DOB: 07-30-1960,   MRN: [id number]  Chief Complaint  Patient presents with   Nail Problem    Pt states she has been having problems with her left great toe for years now but recently it has gotten worse she has also struggled with ingrown's on that toe.  Right great toe nail recently came off completely out of the blue.     62 y.o. female presents for concern as above. Denies any treatments.  . Denies any other pedal complaints. Denies n/v/f/c.   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   BCC (basal cell carcinoma of skin) 06/01/2012   Leg Follows with Dr Margo Aye   Bipolar disorder (HCC) 01/17/2007   Qualifier: Diagnosis of  By: Everardo All MD, Gregary Signs A    Cataract    Chicken pox as a child   X 2   Colitis, Clostridium difficile 5/11,6/11   Colon polyp    benign   Diverticulosis    Diverticulosis    Emphysema of lung (HCC)    Family history of adverse reaction to anesthesia    mother very hard to wake up   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   Gallstones    GERD (gastroesophageal reflux disease)    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   Hashimoto's disease    Headache(784.0) 08/17/2012   migraines   Heart murmur    History of hiatal hernia    Hyperlipidemia    Hypertension    Hypothyroidism    Hypothyroidism 08/24/2006   Qualifier: Diagnosis of  By: Everardo All MD, Sean A     IBS (irritable bowel syndrome) 07/27/2016   Lower back pain    Macular pucker, 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 CFT <3 seconds. Normal hair growth on digits. No edema.  Skin. No lacerations or abrasions bilateral feet. Bilateral hallux nails thickened and dystrophic left worse than right Musculoskeletal: MMT 5/5 bilateral lower extremities in DF, PF, Inversion and Eversion. Deceased ROM in DF of ankle joint.  Neurological: Sensation intact to light touch.   Assessment:   1. Onychodystrophy      Plan:  Patient was evaluated and treated and all questions answered. -Examined patient -Discussed treatment options for painful dystrophic nails and etiology.  -Discussed topical treatments vs removal of the nails -Return as needed in future if painful and need removal.    Louann Sjogren, DPM

## 2022-08-22 ENCOUNTER — Encounter: Payer: Self-pay | Admitting: Internal Medicine

## 2022-09-04 ENCOUNTER — Encounter: Payer: Self-pay | Admitting: Internal Medicine

## 2022-09-04 ENCOUNTER — Ambulatory Visit: Payer: BC Managed Care – PPO | Admitting: Internal Medicine

## 2022-09-04 VITALS — BP 110/72 | HR 72 | Ht 64.0 in | Wt 162.5 lb

## 2022-09-04 DIAGNOSIS — K219 Gastro-esophageal reflux disease without esophagitis: Secondary | ICD-10-CM

## 2022-09-04 DIAGNOSIS — Z8719 Personal history of other diseases of the digestive system: Secondary | ICD-10-CM

## 2022-09-04 DIAGNOSIS — Z86010 Personal history of colonic polyps: Secondary | ICD-10-CM | POA: Diagnosis not present

## 2022-09-04 MED ORDER — PANTOPRAZOLE SODIUM 40 MG PO TBEC: 40.0000 mg | DELAYED_RELEASE_TABLET | Freq: Every day | ORAL | 3 refills | Status: DC

## 2022-09-04 NOTE — Patient Instructions (Signed)
We have sent the following medications to your pharmacy for you to pick up at your convenience:  Pantoprazole.  You have been scheduled for an Upper GI Series at [facility]. Your appointment is on 09/21/2022 at 11:00am. Please arrive 30 minutes prior to your test for registration. Make sure not to eat or drink anything after midnight on the night before your test. If you need to reschedule, please call radiology at [phone]. ________________________________________________________________ An upper GI series uses x rays to help diagnose problems of the upper GI tract, which includes the esophagus, stomach, and duodenum. The duodenum is the first part of the small intestine. An upper GI series is conducted by a radiology technologist or a radiologist--a doctor who specializes in x-ray imaging--at a hospital or outpatient center. While sitting or standing in front of an x-ray machine, the patient drinks barium liquid, which is often white and has a chalky consistency and taste. The barium liquid coats the lining of the upper GI tract and makes signs of disease show up more clearly on x rays. X-ray video, called fluoroscopy, is used to view the barium liquid moving through the esophagus, stomach, and duodenum. Additional x rays and fluoroscopy are performed while the patient lies on an x-ray table. To fully coat the upper GI tract with barium liquid, the technologist or radiologist may press on the abdomen or ask the patient to change position. Patients hold still in various positions, allowing the technologist or radiologist to take x rays of the upper GI tract at different angles. If a technologist conducts the upper GI series, a radiologist will later examine the images to look for problems.  This test typically takes about 1 hour to complete. __________________________________________________________________

## 2022-09-04 NOTE — Progress Notes (Signed)
HISTORY OF PRESENT ILLNESS:  Joan Robinson is a 62 y.o. female with past medical history as listed below.  I have been evaluating her for problems with abdominal pain, nausea with vomiting, large hiatal hernia, and refractory reflux symptoms as well as chronic cough.  Upper endoscopy at that time showed a very large complex hiatal hernia.  The same was confirmed with upper GI series.  She then underwent surveillance colonoscopy February 17, 2021.  She was found to have diverticulosis without neoplasia.  Follow-up in 5 years recommended (due to personal history of multiple and advanced adenomas).  Thereafter, she was subsequently referred to Dr. Luretha Murphy for surgical repair of the complex symptomatic, hernia.  Thereafter, due to failed surgeries, sent to [facility] to see Dr. Mikel Cella.  Outlined highlights below:  Thoracic Surgery Clinic - Post-op Visit  DIAGNOSIS AND TREATMENT SUMMARY   Diagnosis:  Recurrent hiatal hernia Soft tissue abscess at G-tube site  Preoperative Therapy: None   Procedure(s):  05/01/2021 - Robot assisted laparoscopic hiatal hernia repair with Nissen fundoplication with Dr. Luretha Murphy at [facility] 07/12/2021 - Endoscopy, laparoscopy, with foregut dissection above the chest with herniated stomach in left chest with Dr. Luretha Murphy at [facility] 09/25/2021 - Re-do robot assisted laparoscopic paraesophageal hernia with mesh, fundoplication left in place due to severe adhesions to the esophagus with wrap left partially intact with pexy to the left hemidiaphragm, PEG tube left in place (Dr. Mikel Cella) 10/28/2021- U/S-guided placement of JP drain ([facility]) 11/02/2021- G-tube removal with new GJ-tube placed in different location ([facility]) 12/20/2021- EGD dilation GJ tube removal   Joan Robinson has had a slow recovery.  She has lost 40 pounds, but her weight has stabilized in recent months in May she was seen in the emergency room for abdominal pain.  CT imaging  06/04/2022 revealed uncomplicated sigmoid diverticulitis and incidental cholelithiasis.  She was treated with Augmentin.  This follow-up made regarding current GI complaints of postprandial nausea with some reflux symptoms.  Currently not on PPI.  Using Tums.  She is concerned about possible slipped wrap.  She did have esophageal dilation for esophageal stricturing.  Currently without significant esophageal dysphagia by history.   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   BCC (basal cell carcinoma of skin) 06/01/2012   Leg Follows with Dr Margo Aye   Bipolar disorder [facility]) 01/17/2007   Qualifier: Diagnosis of  By: Everardo All MD, Gregary Signs A    Cataract    Chicken pox as a child   X 2   Colitis, Clostridium difficile 5/11,6/11   Colon polyp    benign   Diverticulosis    Diverticulosis    Emphysema of lung (HCC)    Family history of adverse reaction to anesthesia    mother very hard to wake up   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   Gallstones    GERD (gastroesophageal reflux disease)    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   Hashimoto's disease    Headache(784.0) 08/17/2012   migraines   Heart murmur  History of hiatal hernia    Hyperlipidemia    Hypertension    Hypothyroidism    Hypothyroidism 08/24/2006   Qualifier: Diagnosis of  By: Everardo All MD, Gregary Signs A     IBS (irritable bowel syndrome) 07/27/2016   Lower back pain    Macular pucker, bilateral 10/10/2011   Mumps as a child   Obesity 11/01/2013   Perimenopausal 01/16/2012   Pneumonia    Preventative  health care 10/10/2011   PUD (peptic ulcer disease)    Right knee pain 05/10/2012   Sinusitis acute 10/10/2011   Sleep apnea 04/27/2016   Staph aureus infection 12/18/2011   Recurrent lesions in nares   Tobacco abuse    Urinary incontinence     Past Surgical History:  Procedure Laterality Date   ABDOMINAL HYSTERECTOMY     ABDOMINAL HYSTERECTOMY  01/15/2009   complete   COLONOSCOPY     DILATION AND CURETTAGE OF UTERUS  01/16/1983   EYE SURGERY  03/03/2014   Surgery on both eyes for epiretinal membrane (vitreous peel)   Gated Spect wall motion stress cardiolite  11/05/2001   HIATAL HERNIA REPAIR N/A 07/12/2021   Procedure: LAPAROSCOPY W/ EXTENSIVE FOREGUT DISSECTION; PARTIAL STOMACH REDUCTION; GASTROSTOMY TUBE PLACEMENT; GASTROPEXY;  Surgeon: Luretha Murphy, MD;  Location: [facility];  Service: General;  Laterality: N/A;   POLYPECTOMY     TUBAL LIGATION  01/15/1993   UTERINE SUSPENSION     mesh   VITRECTOMY Bilateral 03/03/2014   XI ROBOTIC ASSISTED HIATAL HERNIA REPAIR N/A 05/01/2021   Procedure: XI ROBOTIC ASSISTED TYPE III HIATAL HERNIA REPAIR WITH FUNDOPLICATION;  Surgeon: Luretha Murphy, MD;  Location: [facility];  Service: General;  Laterality: N/A;    Social History Joan Robinson  reports that she quit smoking about 13 years ago. Her smoking use included cigarettes. She started smoking about 43 years ago. She has a 30 pack-year smoking history. She has never used smokeless tobacco. She reports that she does not drink alcohol and does not use drugs.  family history includes Aneurysm in her father; Arthritis in her sister; Colon cancer in her paternal uncle; Colon polyps in her father; Heart disease in her mother; Hyperlipidemia in her mother; Hypertension in her mother and sister; Irritable bowel syndrome in her daughter; Mental illness in her daughter and son; Other in her daughter, father, mother, sister, and sister; Stroke in her mother.  Allergies  Allergen Reactions    Aspirin Shortness Of Breath   Nsaids Shortness Of Breath   Keflex [Cephalexin] Diarrhea   Butamben-Tetracaine-Benzocaine     Mouth sores   Dicyclomine Hcl     REACTION: mouth ulcers   Metronidazole Hives    mouth ulcers   Phenergan [Promethazine Hcl] Other (See Comments)    "knocks" pt out for about 3 days   Quetiapine     Somnolence. Slept for 36 hours straight.   Benay Spice [Lifitegrast]     Burned eyes   Cymbalta [Duloxetine Hcl] Rash   Levothyroxine Palpitations    Pt must use brand SYNTHROID   Moxifloxacin Nausea Only and Palpitations    REACTION: increased heart rate   Moxifloxacin Hcl In Nacl Palpitations   Orphenadrine Citrate Palpitations   Sulfamethoxazole-Trimethoprim Nausea Only and Palpitations    REACTION: increased heart rate, nausea       PHYSICAL EXAMINATION: Vital signs: BP 110/72 (BP Location: Left Arm, Patient Position: Sitting, Cuff Size: Normal)   Pulse 72   Ht 5\' 4"  (1.626 m)   Wt 162 lb 8  oz (73.7 kg)   BMI 27.89 kg/m   Constitutional: generally well-appearing, no acute distress Psychiatric: alert and oriented x3, cooperative Eyes: extraocular movements intact, anicteric, conjunctiva pink Mouth: oral pharynx moist, no lesions Neck: supple no lymphadenopathy Cardiovascular: heart regular rate and rhythm, no murmur Lungs: clear to auscultation bilaterally Abdomen: soft, nontender, nondistended, no obvious ascites, no peritoneal signs, normal bowel sounds, no organomegaly.  Multiple laparoscopic incisions well-healed Rectal: Omitted Extremities: no clubbing, cyanosis, or lower extremity edema bilaterally Skin: no lesions on visible extremities Neuro: No focal deficits.  Cranial nerves intact  ASSESSMENT:  1.  Complex hiatal hernia status post multiple surgeries as outlined 2.  History of postoperative esophageal stricture requiring dilation. 3.  Recent problems with postprandial nausea and recurrent GERD 4.  History of multiple advanced  adenomatous colon polyps.  Surveillance up-to-date   PLAN:  1.  Prescribe pantoprazole 40 mg daily 2.  Reflux precautions 3.  Schedule upper GI series to evaluate her postoperative anatomy 4.  GI office follow-up 2 months 5.  Surveillance colonoscopy around February 2028 Total time of 40 minutes was spent preparing to see the patient, reviewing emerita outside records, obtaining interval history, performing medically appropriate physical examination, counseling and educating the patient regarding the above listed issues, ordering medication, ordering advanced radiology study, arranging follow-up, and documenting clinical information in the health record

## 2022-09-20 ENCOUNTER — Encounter: Payer: Self-pay | Admitting: Cardiology

## 2022-09-20 DIAGNOSIS — I359 Nonrheumatic aortic valve disorder, unspecified: Secondary | ICD-10-CM | POA: Insufficient documentation

## 2022-09-20 DIAGNOSIS — I700 Atherosclerosis of aorta: Secondary | ICD-10-CM | POA: Insufficient documentation

## 2022-09-20 NOTE — Progress Notes (Unsigned)
Cardiology Office Note   Date:  09/23/2022   ID:  Joan Robinson, DOB [birth year], MRN [id number]  PCP:  April Manson, NP  Cardiologist:   Rollene Rotunda, MD Referring:  April Manson, NP  Chief Complaint  Patient presents with   Elevated coronary calcium      History of Present Illness: Joan Robinson is a 63 y.o. female who presents for evaluation of aortic valve calcification and aortic atherosclerosis.  She had a cath years ago at [facility] and a stress test.  It looks like she might have been in her 40s.  She was told this was normal.  She does not remember all the details around this.  She has not had any cardiac issues since then.  She was found to have coronary, aortic and aortic valve calcification on CT recently.  She was being seen by another cardiologist in town but I do not have these records.  She is not clear if he found anything and was some other testing apparently planned but did not happen.  I do not have any of the details of this.  She has recently gone through hiatal hernia surgery   The patient denies any new symptoms such as chest discomfort, neck or arm discomfort. There has been no new shortness of breath, PND or orthopnea. There have been no reported palpitations, presyncope or syncope.  She tries to do walking for exercise.  She has no limitations to this.   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   Gallstones    GERD  (gastroesophageal reflux disease)    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   Hashimoto's disease    Hyperlipidemia    Hypertension    Hypothyroidism 08/24/2006   Qualifier: Diagnosis of  By: Everardo All MD, Sean A     IBS (irritable bowel syndrome) 07/27/2016   Obesity 11/01/2013   PUD (peptic ulcer disease)    Sleep apnea 04/27/2016   Tobacco abuse     Past Surgical History:  Procedure Laterality Date   ABDOMINAL HYSTERECTOMY     ABDOMINAL HYSTERECTOMY  01/15/2009   complete   COLONOSCOPY     DILATION AND CURETTAGE OF UTERUS  01/16/1983   EYE SURGERY  03/03/2014   Surgery on both eyes for epiretinal membrane (vitreous peel)   Gated Spect wall motion stress cardiolite  11/05/2001   HIATAL HERNIA REPAIR N/A 07/12/2021   Procedure: LAPAROSCOPY W/ EXTENSIVE FOREGUT DISSECTION; PARTIAL STOMACH REDUCTION; GASTROSTOMY TUBE PLACEMENT; GASTROPEXY;  Surgeon: Luretha Murphy, MD;  Location: [facility];  Service: General;  Laterality: N/A;   POLYPECTOMY     TUBAL LIGATION  01/15/1993   UTERINE SUSPENSION     mesh   VITRECTOMY Bilateral 03/03/2014   XI ROBOTIC ASSISTED HIATAL HERNIA REPAIR N/A 05/01/2021   Procedure: XI ROBOTIC ASSISTED TYPE III HIATAL HERNIA REPAIR WITH FUNDOPLICATION;  Surgeon:  Luretha Murphy, MD;  Location: [facility];  Service: General;  Laterality: N/A;     Current Outpatient Medications  Medication Sig Dispense Refill   calcium carbonate (TUMS) 500 MG chewable tablet Chew 1-2 tablets by mouth as needed.     cetirizine (ZYRTEC) 10 MG tablet Take 10 mg by mouth as needed.     Cholecalciferol (D 1000) 25 MCG (1000 UT) capsule Take 1,000 Units by mouth daily.     estradiol (ESTRACE) 0.1 MG/GM vaginal cream Place 1 Applicatorful vaginally 2 (two) times a week.     fexofenadine (ALLEGRA) 180 MG tablet Take 180 mg by mouth as needed.     HYDROcodone-acetaminophen (NORCO) 10-325 MG tablet Take 1 tablet by mouth every 8 (eight) hours as needed.     latanoprost (XALATAN) 0.005 %  ophthalmic solution Place 1 drop into both eyes at bedtime.     lisdexamfetamine (VYVANSE) 70 MG capsule Take 70 mg by mouth daily.     nitroGLYCERIN (NITROSTAT) 0.4 MG SL tablet Place 0.4 mg under the tongue every 5 (five) minutes as needed.     ondansetron (ZOFRAN) 4 MG tablet Take 1 tablet (4 mg total) by mouth every 8 (eight) hours as needed for nausea or vomiting. Take 1-2 tablets every 4-6 hours as needed for nausea 30 tablet 2   pantoprazole (PROTONIX) 40 MG tablet Take 1 tablet (40 mg total) by mouth daily. 90 tablet 3   raNITIdine HCl (RANITIDINE 75 PO) Take 1 tablet by mouth as needed (Hives).     rosuvastatin (CRESTOR) 20 MG tablet Take 1 tablet (20 mg total) by mouth daily. 90 tablet 3   SYNTHROID 88 MCG tablet Take 88 mcg by mouth daily before breakfast.     No current facility-administered medications for this visit.    Allergies:   Aspirin, Nsaids, Keflex [cephalexin], Butamben-tetracaine-benzocaine, Dicyclomine hcl, Metronidazole, Phenergan [promethazine hcl], Quetiapine, Xiidra [lifitegrast], Cymbalta [duloxetine hcl], Levothyroxine, Moxifloxacin, Moxifloxacin hcl in nacl, Orphenadrine citrate, and Sulfamethoxazole-trimethoprim    Social History:  The patient  reports that she quit smoking about 13 years ago. Her smoking use included cigarettes. She started smoking about 43 years ago. She has a 30 pack-year smoking history. She has never used smokeless tobacco. She reports that she does not drink alcohol and does not use drugs.   Family History:  The patient's family history includes Aneurysm in her father; Arthritis in her sister; Colon cancer in her paternal uncle; Colon polyps in her father; Heart disease (age of onset: 99) in her mother; Hyperlipidemia in her mother; Hypertension in her mother and sister; Irritable bowel syndrome in her daughter; Mental illness in her daughter and son; Other in her daughter, father, mother, sister, and sister; Stroke in her mother.    ROS:   Please see the history of present illness.   Otherwise, review of systems are positive for none.   All other systems are reviewed and negative.    PHYSICAL EXAM: VS:  BP 124/80 (BP Location: Left Arm, Patient Position: Sitting, Cuff Size: Normal)   Pulse 77   Ht 5\' 6"  (1.676 m)   Wt 162 lb 9.6 oz (73.8 kg)   SpO2 96%   BMI 26.24 kg/m  , BMI Body mass index is 26.24 kg/m. GENERAL:  Well appearing HEENT:  Pupils equal round and reactive, fundi not visualized, oral mucosa unremarkable NECK:  No jugular venous distention, waveform within normal limits, carotid upstroke brisk and symmetric, no bruits, no thyromegaly LYMPHATICS:  No cervical, inguinal adenopathy  LUNGS:  Clear to auscultation bilaterally BACK:  No CVA tenderness CHEST:  Unremarkable HEART:  PMI not displaced or sustained,S1 and S2 within normal limits, no S3, no S4, no clicks, no rubs, no murmurs ABD:  Flat, positive bowel sounds normal in frequency in pitch, no bruits, no rebound, no guarding, no midline pulsatile mass, no hepatomegaly, no splenomegaly EXT:  2 plus pulses throughout, no edema, no cyanosis no clubbing SKIN:  No rashes no nodules NEURO:  Cranial nerves II through XII grossly intact, motor grossly intact throughout [facility]:  Cognitively intact, oriented to person place and time    EKG:  EKG Interpretation Date/Time:  Friday September 21 2022 16:03:20 EDT Ventricular Rate:  77 PR Interval:  122 QRS Duration:  84 QT Interval:  404 QTC Calculation: 457 R Axis:   14  Text Interpretation: Normal sinus rhythm Normal ECG When compared with ECG of 04-Jun-2022 17:05, QRS axis Shifted right QRS voltage has increased Borderline criteria for Anteroseptal infarct are no longer Present Nonspecific T wave abnormality now evident in Inferior leads Confirmed by Rollene Rotunda ([id number]) on 09/21/2022 4:40:06 PM     Recent Labs: 06/04/2022: ALT 11; BUN 15; Creatinine, Ser 0.95; Hemoglobin 11.3; Platelets 188; Potassium 4.1;  Sodium 138; TSH 0.245    Lipid Panel    Component Value Date/Time   CHOL 155 12/21/2016 1126   TRIG 72 07/17/2021 0441   HDL 56.20 12/21/2016 1126   CHOLHDL 3 12/21/2016 1126   VLDL 23.4 12/21/2016 1126   LDLCALC 76 12/21/2016 1126   LDLDIRECT 104.0 04/08/2015 1212      Wt Readings from Last 3 Encounters:  09/21/22 162 lb 9.6 oz (73.8 kg)  09/04/22 162 lb 8 oz (73.7 kg)  06/04/22 159 lb (72.1 kg)      Other studies Reviewed: Additional studies/ records that were reviewed today include: Labs updated. Review of the above records demonstrates:  Please see elsewhere in the note.    ASSESSMENT AND PLAN:  Aortic valve calcium: She has no murmur associated with this.  I can follow this clinically.  Aortic atherosclerosis: I will pursue aggressive primary risk reduction.  Coronary calcium: Given this and her family history I would like to screen her with a POET (Plain Old Exercise Treadmill).  I will also pursue aggressive primary risk reduction.  Dyslipidemia: Her LDL was 93 with an HDL of 51.  I think the goal should be an LDL probably in the 50s given her family history and I am going to start Crestor 20 mg daily.  I will check an LP(a) and a lipid profile in 3 months .     Current medicines are reviewed at length with the patient today.  The patient does not have concerns regarding medicines.  The following changes have been made:  no change  Labs/ tests ordered today include:   Orders Placed This Encounter  Procedures   Lipid panel   Lipoprotein A (LPA)   Cardiac Stress Test: Informed Consent Details: Physician/Practitioner Attestation; Transcribe to consent form and obtain patient signature   EXERCISE TOLERANCE TEST (ETT)   EKG 12-Lead     Disposition:   FU with me in one year.     Signed, Rollene Rotunda, MD  09/23/2022 8:49 AM     [facility]

## 2022-09-21 ENCOUNTER — Ambulatory Visit: Payer: BC Managed Care – PPO | Admitting: Cardiology

## 2022-09-21 ENCOUNTER — Encounter: Payer: Self-pay | Admitting: Cardiology

## 2022-09-21 ENCOUNTER — Ambulatory Visit
Admission: RE | Admit: 2022-09-21 | Discharge: 2022-09-21 | Disposition: A | Discharge status: Home / Self Care | Payer: BC Managed Care – PPO | Source: Ambulatory Visit | Attending: Internal Medicine | Admitting: Internal Medicine

## 2022-09-21 VITALS — BP 124/80 | HR 77 | Ht 66.0 in | Wt 162.6 lb

## 2022-09-21 DIAGNOSIS — I2510 Atherosclerotic heart disease of native coronary artery without angina pectoris: Secondary | ICD-10-CM | POA: Insufficient documentation

## 2022-09-21 DIAGNOSIS — I700 Atherosclerosis of aorta: Secondary | ICD-10-CM | POA: Diagnosis present

## 2022-09-21 DIAGNOSIS — I359 Nonrheumatic aortic valve disorder, unspecified: Secondary | ICD-10-CM

## 2022-09-21 DIAGNOSIS — K219 Gastro-esophageal reflux disease without esophagitis: Secondary | ICD-10-CM | POA: Insufficient documentation

## 2022-09-21 DIAGNOSIS — Z79899 Other long term (current) drug therapy: Secondary | ICD-10-CM | POA: Insufficient documentation

## 2022-09-21 DIAGNOSIS — Z8719 Personal history of other diseases of the digestive system: Secondary | ICD-10-CM | POA: Diagnosis present

## 2022-09-21 MED ORDER — ROSUVASTATIN CALCIUM 20 MG PO TABS: 20.0000 mg | ORAL_TABLET | Freq: Every day | ORAL | 3 refills | Status: DC

## 2022-09-21 NOTE — Patient Instructions (Addendum)
Medication Instructions:  Your physician recommends that you continue on your current medications as directed. Please refer to the Current Medication list given to you today.    *If you need a refill on your cardiac medications before your next appointment, please call your pharmacy*    Dr. Antoine Poche has ordered you to have a plan old exercise stress test.    Follow-Up: At [facility], you and your health needs are our priority.  As part of our continuing mission to provide you with exceptional heart care, we have created designated Provider Care Teams.  These Care Teams include your primary Cardiologist (physician) and Advanced Practice Providers (APPs -  Physician Assistants and Nurse Practitioners) who all work together to provide you with the care you need, when you need it.  We recommend signing up for the patient portal called "MyChart".  Sign up information is provided on this After Visit Summary.  MyChart is used to connect with patients for Virtual Visits (Telemedicine).  Patients are able to view lab/test results, encounter notes, upcoming appointments, etc.  Non-urgent messages can be sent to your provider as well.   To learn more about what you can do with MyChart, go to [email or web address].    Your next appointment:   Follow up in 1 year in person    Provider:   Rollene Rotunda, MD

## 2022-09-23 ENCOUNTER — Encounter: Payer: Self-pay | Admitting: Cardiology

## 2022-09-25 ENCOUNTER — Other Ambulatory Visit: Payer: Self-pay

## 2022-09-25 DIAGNOSIS — I359 Nonrheumatic aortic valve disorder, unspecified: Secondary | ICD-10-CM

## 2022-09-25 DIAGNOSIS — I700 Atherosclerosis of aorta: Secondary | ICD-10-CM

## 2022-09-25 NOTE — Progress Notes (Signed)
Order placed to ETT. Error occurred with last order.

## 2022-09-27 ENCOUNTER — Telehealth (HOSPITAL_COMMUNITY): Payer: Self-pay

## 2022-09-27 NOTE — Telephone Encounter (Signed)
Detailed instructions left on the patient's answering machine. Asked to call back with any questions. S.Aby Gessel CCT

## 2022-10-02 ENCOUNTER — Ambulatory Visit (HOSPITAL_COMMUNITY): Payer: BC Managed Care – PPO | Attending: Cardiology

## 2022-10-02 DIAGNOSIS — I700 Atherosclerosis of aorta: Secondary | ICD-10-CM | POA: Diagnosis present

## 2022-10-02 DIAGNOSIS — I359 Nonrheumatic aortic valve disorder, unspecified: Secondary | ICD-10-CM

## 2022-10-02 LAB — EXERCISE TOLERANCE TEST
Estimated workload: 10.1
Exercise duration (min): 8 min
Exercise duration (sec): 59 s
MPHR: 159 {beats}/min
Peak HR: 150 {beats}/min
Percent HR: 94 %
Rest HR: 63 {beats}/min
ST Depression (mm): 0 mm

## 2022-10-03 ENCOUNTER — Telehealth: Payer: Self-pay | Admitting: Internal Medicine

## 2022-10-03 ENCOUNTER — Other Ambulatory Visit: Payer: Self-pay | Admitting: *Deleted

## 2022-10-03 DIAGNOSIS — R1013 Epigastric pain: Secondary | ICD-10-CM

## 2022-10-03 DIAGNOSIS — K219 Gastro-esophageal reflux disease without esophagitis: Secondary | ICD-10-CM

## 2022-10-03 DIAGNOSIS — R110 Nausea: Secondary | ICD-10-CM

## 2022-10-03 NOTE — Telephone Encounter (Signed)
Inbound call from patient stating for the last 3 days her right mid stomach has been hurting and has been sick on her stomach. Patient is requesting a call to discuss. Please advise.

## 2022-10-03 NOTE — Telephone Encounter (Signed)
Called patient in response to her complaints of stomach pains  and nausea for the last 3 days. Patient states the pain is located underneath her breasts, mid- abdomin. Patient also informs the nurse she has gallstones, but adheres to her diet and refrains from greasy and fried foods as well as dietary products. Suggested the patient try OTC Pepto- Bismol, Maalox, and IBGard for stomach pains. Informed the patient, information will be given to the MD for further recommendations if needed. Patient understood and agreed.

## 2022-10-03 NOTE — Telephone Encounter (Signed)
Called patient with recommendations per Dr. Marina Goodell. Informed the patient she would need to go to the ED if pain is significant, labs ordered with notifying the patient of the location and hours of operation. Also informed the patient if  it is her gallbladder, she would need to have surgery, per Dr. Marina Goodell. Patient informed the nurse that she already has Zofran for nausea. Patient understood and agreed with information given.

## 2022-10-03 NOTE — Telephone Encounter (Signed)
1.  Other that you reached out to me 2.  Have her come in for CBC, comprehensive metabolic panel, and lipase 3.  If that is her gallbladder, the treatment is surgery.  If the pain is significantly bad, she needs to go to the emergency room. 4.  If she does not have medicine for nausea, please prescribe Zofran 4 mg p.o. every 4 hours as needed; #30; 2 refills

## 2022-10-05 ENCOUNTER — Other Ambulatory Visit (INDEPENDENT_AMBULATORY_CARE_PROVIDER_SITE_OTHER): Payer: BC Managed Care – PPO

## 2022-10-05 ENCOUNTER — Other Ambulatory Visit: Payer: Self-pay

## 2022-10-05 DIAGNOSIS — R1013 Epigastric pain: Secondary | ICD-10-CM

## 2022-10-05 DIAGNOSIS — R110 Nausea: Secondary | ICD-10-CM

## 2022-10-05 DIAGNOSIS — K219 Gastro-esophageal reflux disease without esophagitis: Secondary | ICD-10-CM | POA: Diagnosis not present

## 2022-10-05 DIAGNOSIS — Z79899 Other long term (current) drug therapy: Secondary | ICD-10-CM

## 2022-10-05 DIAGNOSIS — I700 Atherosclerosis of aorta: Secondary | ICD-10-CM

## 2022-10-05 LAB — COMPREHENSIVE METABOLIC PANEL
ALT: 15 U/L (ref 0–35)
AST: 18 U/L (ref 0–37)
Albumin: 4.2 g/dL (ref 3.5–5.2)
Alkaline Phosphatase: 88 U/L (ref 39–117)
BUN: 14 mg/dL (ref 6–23)
CO2: 28 mEq/L (ref 19–32)
Calcium: 9.2 mg/dL (ref 8.4–10.5)
Chloride: 104 mEq/L (ref 96–112)
Creatinine, Ser: 0.86 mg/dL (ref 0.40–1.20)
GFR: 72.72 mL/min (ref >60.00)
Glucose, Bld: 89 mg/dL (ref 70–99)
Potassium: 4.7 mEq/L (ref 3.5–5.1)
Sodium: 139 mEq/L (ref 135–145)
Total Bilirubin: 0.6 mg/dL (ref 0.2–1.2)
Total Protein: 6.8 g/dL (ref 6.0–8.3)

## 2022-10-05 LAB — CBC WITH DIFFERENTIAL/PLATELET
Basophils Absolute: 0 10*3/uL (ref 0.0–0.1)
Basophils Relative: 0.5 % (ref 0.0–3.0)
Eosinophils Absolute: 0.1 10*3/uL (ref 0.0–0.7)
Eosinophils Relative: 2.7 % (ref 0.0–5.0)
HCT: 34.5 % — ABNORMAL LOW (ref 36.0–46.0)
Hemoglobin: 11.5 g/dL — ABNORMAL LOW (ref 12.0–15.0)
Lymphocytes Relative: 30.8 % (ref 12.0–46.0)
Lymphs Abs: 1.4 10*3/uL (ref 0.7–4.0)
MCHC: 33.2 g/dL (ref 30.0–36.0)
MCV: 89.1 fl (ref 78.0–100.0)
Monocytes Absolute: 0.4 10*3/uL (ref 0.1–1.0)
Monocytes Relative: 9.9 % (ref 3.0–12.0)
Neutro Abs: 2.5 10*3/uL (ref 1.4–7.7)
Neutrophils Relative %: 56.1 % (ref 43.0–77.0)
Platelets: 190 10*3/uL (ref 150.0–400.0)
RBC: 3.87 Mil/uL (ref 3.87–5.11)
RDW: 12.8 % (ref 11.5–15.5)
WBC: 4.4 10*3/uL (ref 4.0–10.5)

## 2022-10-05 LAB — LIPASE: Lipase: 8 U/L — ABNORMAL LOW (ref 11.0–59.0)

## 2022-10-09 LAB — LIPID PANEL
Chol/HDL Ratio: 1.8 ratio (ref 0.0–4.4)
Cholesterol, Total: 140 mg/dL (ref 100–199)
HDL: 76 mg/dL (ref >39)
LDL Chol Calc (NIH): 51 mg/dL (ref 0–99)
Triglycerides: 62 mg/dL (ref 0–149)
VLDL Cholesterol Cal: 13 mg/dL (ref 5–40)

## 2022-10-09 LAB — LIPOPROTEIN A (LPA): Lipoprotein (a): 21.4 nmol/L (ref <75.0)

## 2022-10-13 ENCOUNTER — Encounter (HOSPITAL_BASED_OUTPATIENT_CLINIC_OR_DEPARTMENT_OTHER): Payer: Self-pay | Admitting: Emergency Medicine

## 2022-10-13 ENCOUNTER — Inpatient Hospital Stay (HOSPITAL_BASED_OUTPATIENT_CLINIC_OR_DEPARTMENT_OTHER)
Admission: EM | Admit: 2022-10-13 | Discharge: 2022-10-25 | DRG: 329 | Disposition: A | Discharge status: Rehabilitation Facility | Payer: BC Managed Care – PPO | Attending: Internal Medicine | Admitting: Internal Medicine

## 2022-10-13 ENCOUNTER — Other Ambulatory Visit: Payer: Self-pay

## 2022-10-13 ENCOUNTER — Emergency Department (HOSPITAL_BASED_OUTPATIENT_CLINIC_OR_DEPARTMENT_OTHER): Payer: BC Managed Care – PPO

## 2022-10-13 DIAGNOSIS — Z882 Allergy status to sulfonamides status: Secondary | ICD-10-CM

## 2022-10-13 DIAGNOSIS — Z881 Allergy status to other antibiotic agents status: Secondary | ICD-10-CM

## 2022-10-13 DIAGNOSIS — K56600 Partial intestinal obstruction, unspecified as to cause: Secondary | ICD-10-CM

## 2022-10-13 DIAGNOSIS — R1319 Other dysphagia: Secondary | ICD-10-CM | POA: Diagnosis not present

## 2022-10-13 DIAGNOSIS — G4700 Insomnia, unspecified: Secondary | ICD-10-CM | POA: Diagnosis present

## 2022-10-13 DIAGNOSIS — R270 Ataxia, unspecified: Secondary | ICD-10-CM | POA: Diagnosis not present

## 2022-10-13 DIAGNOSIS — L509 Urticaria, unspecified: Secondary | ICD-10-CM | POA: Diagnosis present

## 2022-10-13 DIAGNOSIS — R4781 Slurred speech: Secondary | ICD-10-CM | POA: Diagnosis not present

## 2022-10-13 DIAGNOSIS — H510 Palsy (spasm) of conjugate gaze: Secondary | ICD-10-CM | POA: Diagnosis not present

## 2022-10-13 DIAGNOSIS — J439 Emphysema, unspecified: Secondary | ICD-10-CM | POA: Diagnosis present

## 2022-10-13 DIAGNOSIS — R29710 NIHSS score 10: Secondary | ICD-10-CM | POA: Diagnosis not present

## 2022-10-13 DIAGNOSIS — E039 Hypothyroidism, unspecified: Secondary | ICD-10-CM | POA: Diagnosis present

## 2022-10-13 DIAGNOSIS — G935 Compression of brain: Secondary | ICD-10-CM | POA: Diagnosis not present

## 2022-10-13 DIAGNOSIS — G4730 Sleep apnea, unspecified: Secondary | ICD-10-CM | POA: Diagnosis present

## 2022-10-13 DIAGNOSIS — K5790 Diverticulosis of intestine, part unspecified, without perforation or abscess without bleeding: Secondary | ICD-10-CM

## 2022-10-13 DIAGNOSIS — Z8249 Family history of ischemic heart disease and other diseases of the circulatory system: Secondary | ICD-10-CM

## 2022-10-13 DIAGNOSIS — H5789 Other specified disorders of eye and adnexa: Secondary | ICD-10-CM | POA: Diagnosis not present

## 2022-10-13 DIAGNOSIS — Z5331 Laparoscopic surgical procedure converted to open procedure: Secondary | ICD-10-CM

## 2022-10-13 DIAGNOSIS — Z886 Allergy status to analgesic agent status: Secondary | ICD-10-CM

## 2022-10-13 DIAGNOSIS — Z8719 Personal history of other diseases of the digestive system: Secondary | ICD-10-CM

## 2022-10-13 DIAGNOSIS — Z823 Family history of stroke: Secondary | ICD-10-CM

## 2022-10-13 DIAGNOSIS — Z903 Acquired absence of stomach [part of]: Secondary | ICD-10-CM

## 2022-10-13 DIAGNOSIS — K56609 Unspecified intestinal obstruction, unspecified as to partial versus complete obstruction: Secondary | ICD-10-CM | POA: Diagnosis not present

## 2022-10-13 DIAGNOSIS — R1031 Right lower quadrant pain: Principal | ICD-10-CM

## 2022-10-13 DIAGNOSIS — I63542 Cerebral infarction due to unspecified occlusion or stenosis of left cerebellar artery: Secondary | ICD-10-CM | POA: Diagnosis not present

## 2022-10-13 DIAGNOSIS — K8020 Calculus of gallbladder without cholecystitis without obstruction: Secondary | ICD-10-CM | POA: Diagnosis present

## 2022-10-13 DIAGNOSIS — I639 Cerebral infarction, unspecified: Secondary | ICD-10-CM | POA: Insufficient documentation

## 2022-10-13 DIAGNOSIS — F319 Bipolar disorder, unspecified: Secondary | ICD-10-CM | POA: Diagnosis present

## 2022-10-13 DIAGNOSIS — F419 Anxiety disorder, unspecified: Secondary | ICD-10-CM | POA: Diagnosis present

## 2022-10-13 DIAGNOSIS — K562 Volvulus: Secondary | ICD-10-CM | POA: Diagnosis not present

## 2022-10-13 DIAGNOSIS — Z8711 Personal history of peptic ulcer disease: Secondary | ICD-10-CM

## 2022-10-13 DIAGNOSIS — Z888 Allergy status to other drugs, medicaments and biological substances status: Secondary | ICD-10-CM

## 2022-10-13 DIAGNOSIS — Z90710 Acquired absence of both cervix and uterus: Secondary | ICD-10-CM

## 2022-10-13 DIAGNOSIS — H409 Unspecified glaucoma: Secondary | ICD-10-CM | POA: Diagnosis present

## 2022-10-13 DIAGNOSIS — Z79890 Hormone replacement therapy (postmenopausal): Secondary | ICD-10-CM

## 2022-10-13 DIAGNOSIS — E669 Obesity, unspecified: Secondary | ICD-10-CM | POA: Diagnosis present

## 2022-10-13 DIAGNOSIS — D649 Anemia, unspecified: Secondary | ICD-10-CM | POA: Diagnosis present

## 2022-10-13 DIAGNOSIS — K222 Esophageal obstruction: Secondary | ICD-10-CM | POA: Diagnosis present

## 2022-10-13 DIAGNOSIS — K5730 Diverticulosis of large intestine without perforation or abscess without bleeding: Secondary | ICD-10-CM | POA: Diagnosis present

## 2022-10-13 DIAGNOSIS — E785 Hyperlipidemia, unspecified: Secondary | ICD-10-CM | POA: Diagnosis present

## 2022-10-13 DIAGNOSIS — Z6826 Body mass index (BMI) 26.0-26.9, adult: Secondary | ICD-10-CM

## 2022-10-13 DIAGNOSIS — Z79899 Other long term (current) drug therapy: Secondary | ICD-10-CM

## 2022-10-13 DIAGNOSIS — Z79891 Long term (current) use of opiate analgesic: Secondary | ICD-10-CM

## 2022-10-13 DIAGNOSIS — K219 Gastro-esophageal reflux disease without esophagitis: Secondary | ICD-10-CM | POA: Diagnosis present

## 2022-10-13 DIAGNOSIS — J309 Allergic rhinitis, unspecified: Secondary | ICD-10-CM | POA: Diagnosis present

## 2022-10-13 DIAGNOSIS — E876 Hypokalemia: Secondary | ICD-10-CM | POA: Diagnosis present

## 2022-10-13 DIAGNOSIS — G8929 Other chronic pain: Secondary | ICD-10-CM | POA: Diagnosis present

## 2022-10-13 DIAGNOSIS — M549 Dorsalgia, unspecified: Secondary | ICD-10-CM | POA: Diagnosis present

## 2022-10-13 DIAGNOSIS — R29810 Facial weakness: Secondary | ICD-10-CM | POA: Diagnosis not present

## 2022-10-13 DIAGNOSIS — D638 Anemia in other chronic diseases classified elsewhere: Secondary | ICD-10-CM | POA: Diagnosis present

## 2022-10-13 DIAGNOSIS — Z85828 Personal history of other malignant neoplasm of skin: Secondary | ICD-10-CM

## 2022-10-13 DIAGNOSIS — E43 Unspecified severe protein-calorie malnutrition: Secondary | ICD-10-CM | POA: Diagnosis present

## 2022-10-13 DIAGNOSIS — Z87891 Personal history of nicotine dependence: Secondary | ICD-10-CM

## 2022-10-13 DIAGNOSIS — R471 Dysarthria and anarthria: Secondary | ICD-10-CM | POA: Diagnosis not present

## 2022-10-13 DIAGNOSIS — I10 Essential (primary) hypertension: Secondary | ICD-10-CM | POA: Diagnosis present

## 2022-10-13 LAB — CBC
HCT: 36 % (ref 36.0–46.0)
Hemoglobin: 11.9 g/dL — ABNORMAL LOW (ref 12.0–15.0)
MCH: 29.8 pg (ref 26.0–34.0)
MCHC: 33.1 g/dL (ref 30.0–36.0)
MCV: 90.2 fL (ref 80.0–100.0)
Platelets: 179 10*3/uL (ref 150–400)
RBC: 3.99 MIL/uL (ref 3.87–5.11)
RDW: 12.6 % (ref 11.5–15.5)
WBC: 5.2 10*3/uL (ref 4.0–10.5)
nRBC: 0 % (ref 0.0–0.2)

## 2022-10-13 LAB — COMPREHENSIVE METABOLIC PANEL
ALT: 16 U/L (ref 0–44)
AST: 20 U/L (ref 15–41)
Albumin: 4.6 g/dL (ref 3.5–5.0)
Alkaline Phosphatase: 78 U/L (ref 38–126)
Anion gap: 8 (ref 5–15)
BUN: 16 mg/dL (ref 8–23)
CO2: 27 mmol/L (ref 22–32)
Calcium: 9.3 mg/dL (ref 8.9–10.3)
Chloride: 104 mmol/L (ref 98–111)
Creatinine, Ser: 0.97 mg/dL (ref 0.44–1.00)
GFR, Estimated: >60 mL/min (ref >60)
Glucose, Bld: 142 mg/dL — ABNORMAL HIGH (ref 70–99)
Potassium: 3.8 mmol/L (ref 3.5–5.1)
Sodium: 139 mmol/L (ref 135–145)
Total Bilirubin: 0.5 mg/dL (ref 0.3–1.2)
Total Protein: 7 g/dL (ref 6.5–8.1)

## 2022-10-13 LAB — LIPASE, BLOOD: Lipase: 10 U/L — ABNORMAL LOW (ref 11–51)

## 2022-10-13 MED ORDER — LIDOCAINE VISCOUS HCL 2 % MT SOLN
15.0000 mL | Freq: Once | OROMUCOSAL | Status: AC
  Administered 2022-10-13: 15 mL via OROMUCOSAL
  Filled 2022-10-13: qty 15

## 2022-10-13 MED ORDER — SODIUM CHLORIDE 0.9 % IV BOLUS
500.0000 mL | Freq: Once | INTRAVENOUS | Status: AC
  Administered 2022-10-13: 500 mL via INTRAVENOUS

## 2022-10-13 MED ORDER — SODIUM CHLORIDE 0.9 % IV SOLN: INTRAVENOUS | Status: DC

## 2022-10-13 MED ORDER — HYDROMORPHONE HCL 1 MG/ML IJ SOLN
1.0000 mg | Freq: Once | INTRAMUSCULAR | Status: AC
  Administered 2022-10-13: 1 mg via INTRAVENOUS
  Filled 2022-10-13: qty 1

## 2022-10-13 MED ORDER — IOHEXOL 300 MG/ML  SOLN
100.0000 mL | Freq: Once | INTRAMUSCULAR | Status: AC | PRN
  Administered 2022-10-13: 100 mL via INTRAVENOUS

## 2022-10-13 MED ORDER — ONDANSETRON 4 MG PO TBDP
4.0000 mg | ORAL_TABLET | Freq: Once | ORAL | Status: AC
  Administered 2022-10-13: 4 mg via ORAL
  Filled 2022-10-13: qty 1

## 2022-10-13 NOTE — ED Triage Notes (Signed)
Pt c/o  RUQ and RLQ pain x 2 days with intermittent n/v. Vicodin at 1800 with no relief

## 2022-10-13 NOTE — Plan of Care (Addendum)
Plan of Care Note for accepted transfer   Patient name: Joan Robinson JOA:[id number] DOB: June 24, 1960  Facility requesting transfer: [facility] Requesting Provider: Dr. Deretha Emory Facility course: 62 year old female with history of complex hiatal hernia status post multiple surgeries, history of postoperative esophageal stricture requiring dilation, GERD/PUD, gallstones, colon polyps, hypertension, hyperlipidemia, hypothyroidism, autoimmune urticaria, emphysema, Freiberg's disease, sleep apnea presented to ED with complaints of right upper and lower quadrant abdominal pain x 2 days associated with nausea and vomiting.  Slightly hypertensive on arrival to the ED but vital signs otherwise stable.  Labs showing no leukocytosis, hemoglobin 11.9 (stable), no elevation of lipase and LFTs.  CT abdomen pelvis showing: IMPRESSION: Area of narrowing within the ascending colon distal to the cecum as well as an associated area of narrowing in the terminal ileum with focal dilatation of the cecum and proximal ascending colon as well as the small bowel proximal to the terminal ileum. These changes are suspicious for internal hernia causing the long segment of narrowing and subsequent small-bowel obstruction. These changes are new from the prior CT examination.   Diverticulosis without diverticulitis.  Patient was given Dilaudid, Zofran, and 500 cc normal saline bolus.  EDP discussed the case with Dr. Freida Busman and general surgery will consult.  NG tube placed.  Plan of care: The patient is accepted for admission to Med-surg  unit at [facility].  [facility] will assume care on arrival to accepting facility. Until arrival, care as per EDP. However, [facility] available 24/7 for questions and assistance.  Check [email or web address] for on-call coverage.  Nursing staff, please call [facility] Admits & Consults System-Wide number under Amion on patient's arrival so appropriate admitting provider can evaluate the pt.

## 2022-10-13 NOTE — ED Provider Notes (Addendum)
[place] EMERGENCY DEPARTMENT AT [facility] Provider Note   CSN: [id number] Arrival date & time: 10/13/22  2050     History  Chief Complaint  Patient presents with   Abdominal Pain    Joan Robinson is a 62 y.o. female.  Patient with acute onset of right-sided abdominal pain right upper quadrant right lower quadrant 2 days ago.  Associated with intermittent nausea vomiting.  Patient recent referral to general surgery for consideration for symptomatic gallstones.  Patient has been known to have gallstones for a number of years but only recently developed symptoms.  She says this is much worse and does not remind her of her previous bout with diverticulitis was in May 2024.  Temp here 98.3 pulse 76 respirations 20 blood pressure 150/85.  Oxygen saturation 99%.  Patient has a history of hiatal hernia repair done locally in 2023 and then required additional surgery at [facility] this year.  Past medical history is significant for hypertension hyperlipidemia peptic ulcer disease diverticulitis bipolar disorder hypothyroidism gastroesophageal reflux disease Hashimoto's disease.  Past surgical history significant for the hiatal hernia repair abdominal hysterectomy.  Patient states that she is followed by Dr. Marina Goodell which I believe is her gastroenterologist.  Patient is on Vicodin chronically for back pain.  Patient former smoker quit in 2011.       Home Medications Prior to Admission medications   Medication Sig Start Date End Date Taking? Authorizing Provider  calcium carbonate (TUMS) 500 MG chewable tablet Chew 1-2 tablets by mouth as needed.    [provider]  cetirizine (ZYRTEC) 10 MG tablet Take 10 mg by mouth as needed. 06/14/20   [provider]  Cholecalciferol (D 1000) 25 MCG (1000 UT) capsule Take 1,000 Units by mouth daily. 05/09/16   [provider]  estradiol (ESTRACE) 0.1 MG/GM vaginal cream Place 1 Applicatorful vaginally 2 (two) times a week.  08/02/22   [provider]  fexofenadine (ALLEGRA) 180 MG tablet Take 180 mg by mouth as needed. 10/27/12   [provider]  HYDROcodone-acetaminophen (NORCO) 10-325 MG tablet Take 1 tablet by mouth every 8 (eight) hours as needed. 08/20/22 08/20/23  [provider]  latanoprost (XALATAN) 0.005 % ophthalmic solution Place 1 drop into both eyes at bedtime. 02/06/16   [provider]  lisdexamfetamine (VYVANSE) 70 MG capsule Take 70 mg by mouth daily. 09/20/20   [provider]  nitroGLYCERIN (NITROSTAT) 0.4 MG SL tablet Place 0.4 mg under the tongue every 5 (five) minutes as needed. 03/01/15   [provider]  ondansetron (ZOFRAN) 4 MG tablet Take 1 tablet (4 mg total) by mouth every 8 (eight) hours as needed for nausea or vomiting. Take 1-2 tablets every 4-6 hours as needed for nausea 05/03/21   Luretha Murphy, MD  pantoprazole (PROTONIX) 40 MG tablet Take 1 tablet (40 mg total) by mouth daily. 09/04/22   Hilarie Fredrickson, MD  raNITIdine HCl (RANITIDINE 75 PO) Take 1 tablet by mouth as needed (Hives). 07/23/14   [provider]  rosuvastatin (CRESTOR) 20 MG tablet Take 1 tablet (20 mg total) by mouth daily. 09/21/22 12/20/22  Rollene Rotunda, MD  SYNTHROID 88 MCG tablet Take 88 mcg by mouth daily before breakfast. 12/09/12   [provider]      Allergies    Aspirin, Nsaids, Keflex [cephalexin], Butamben-tetracaine-benzocaine, Dicyclomine hcl, Metronidazole, Phenergan [promethazine hcl], Quetiapine, Xiidra [lifitegrast], Cymbalta [duloxetine hcl], Levothyroxine, Moxifloxacin, Moxifloxacin hcl in nacl, Orphenadrine citrate, and Sulfamethoxazole-trimethoprim    Review  of Systems   Review of Systems  Constitutional:  Negative for chills and fever.  HENT:  Negative for ear pain and sore throat.   Eyes:  Negative for pain and visual disturbance.  Respiratory:  Negative for cough and shortness of breath.   Cardiovascular:  Negative for chest pain  and palpitations.  Gastrointestinal:  Positive for abdominal pain, nausea and vomiting.  Genitourinary:  Negative for dysuria and hematuria.  Musculoskeletal:  Negative for arthralgias and back pain.  Skin:  Negative for color change and rash.  Neurological:  Negative for seizures and syncope.  All other systems reviewed and are negative.   Physical Exam Updated Vital Signs BP 130/81   Pulse 65   Temp 98.3 F (36.8 C) (Oral)   Resp 20   Ht 1.676 m (5\' 6" )   Wt 72.6 kg   SpO2 98%   BMI 25.82 kg/m  Physical Exam Vitals and nursing note reviewed.  Constitutional:      General: She is not in acute distress.    Appearance: Normal appearance. She is well-developed. She is ill-appearing.  HENT:     Head: Normocephalic and atraumatic.  Eyes:     Conjunctiva/sclera: Conjunctivae normal.  Cardiovascular:     Rate and Rhythm: Normal rate and regular rhythm.     Heart sounds: No murmur heard. Pulmonary:     Effort: Pulmonary effort is normal. No respiratory distress.     Breath sounds: Normal breath sounds.  Abdominal:     General: There is no distension.     Palpations: Abdomen is soft.     Tenderness: There is abdominal tenderness. There is no guarding.     Comments: Abdomen with tenderness right upper quadrant right lower quadrant.  But no guarding.  Musculoskeletal:        General: No swelling.     Cervical back: Neck supple.  Skin:    General: Skin is warm and dry.     Capillary Refill: Capillary refill takes less than 2 seconds.  Neurological:     General: No focal deficit present.     Mental Status: She is alert and oriented to person, place, and time.  Psychiatric:        Mood and Affect: Mood normal.     ED Results / Procedures / Treatments   Labs (all labs ordered are listed, but only abnormal results are displayed) Labs Reviewed  LIPASE, BLOOD - Abnormal; Notable for the following components:      Result Value   Lipase 10 (*)    All other components within  normal limits  COMPREHENSIVE METABOLIC PANEL - Abnormal; Notable for the following components:   Glucose, Bld 142 (*)    All other components within normal limits  CBC - Abnormal; Notable for the following components:   Hemoglobin 11.9 (*)    All other components within normal limits  URINALYSIS, ROUTINE W REFLEX MICROSCOPIC    EKG None  Radiology CT ABDOMEN PELVIS W CONTRAST  Result Date: 10/13/2022 CLINICAL DATA:  Right-sided abdominal pain for 2 days, initial encounter EXAM: CT ABDOMEN AND PELVIS WITH CONTRAST TECHNIQUE: Multidetector CT imaging of the abdomen and pelvis was performed using the standard protocol following bolus administration of intravenous contrast. RADIATION DOSE REDUCTION: This exam was performed according to the departmental dose-optimization program which includes automated exposure control, adjustment of the mA and/or kV according to patient size and/or use of iterative reconstruction technique. CONTRAST:  OMNIPAQUE IOHEXOL 300 MG/ML  SOLN COMPARISON:  06/04/2022 FINDINGS: Lower chest: No acute abnormality. Hepatobiliary: No focal liver abnormality is seen. No gallstones, gallbladder wall thickening, or biliary dilatation. Pancreas: Unremarkable. No pancreatic ductal dilatation or surrounding inflammatory changes. Spleen: Normal in size without focal abnormality. Adrenals/Urinary Tract: Adrenal glands are within normal limits. Kidneys demonstrate a normal enhancement pattern bilaterally. No renal calculi or obstructive changes are seen. The bladder is decompressed. Stomach/Bowel: Scattered diverticular change of the colon is noted without evidence of diverticulitis. There is a long segment of narrowing within the ascending colon best seen in the sagittal plane on image number 36 of series 6. No discrete mass is seen. These changes may be related to an internal hernia with dilatation of the cecum just proximal to this long segment of narrowing. Mild inflammatory changes  are seen. Additionally there is narrowing of the terminal ileum associated with the area of narrowing in the ascending colon. Multiple fluid-filled dilated loops of small bowel are noted proximal to this in the ileum. The jejunum and stomach appear within normal limits. Vascular/Lymphatic: Aortic atherosclerosis. No enlarged abdominal or pelvic lymph nodes. Reproductive: Status post hysterectomy. No adnexal masses. Other: No abdominal wall hernia or abnormality. No abdominopelvic ascites. Musculoskeletal: No acute or significant osseous findings. IMPRESSION: Area of narrowing within the ascending colon distal to the cecum as well as an associated area of narrowing in the terminal ileum with focal dilatation of the cecum and proximal ascending colon as well as the small bowel proximal to the terminal ileum. These changes are suspicious for internal hernia causing the long segment of narrowing and subsequent small-bowel obstruction. These changes are new from the prior CT examination. Diverticulosis without diverticulitis. Electronically Signed   By: Alcide Clever M.D.   On: 10/13/2022 22:31    Procedures Procedures    Medications Ordered in ED Medications  0.9 %  sodium chloride infusion (has no administration in time range)  sodium chloride 0.9 % bolus 500 mL (500 mLs Intravenous New Bag/Given 10/13/22 2243)  ondansetron (ZOFRAN-ODT) disintegrating tablet 4 mg (4 mg Oral Given 10/13/22 2239)  HYDROmorphone (DILAUDID) injection 1 mg (1 mg Intravenous Given 10/13/22 2243)  iohexol (OMNIPAQUE) 300 MG/ML solution 100 mL (100 mLs Intravenous Contrast Given 10/13/22 2208)    ED Course/ Medical Decision Making/ A&P                                 Medical Decision Making Amount and/or Complexity of Data Reviewed Labs: ordered. Radiology: ordered.  Risk Prescription drug management. Decision regarding hospitalization.   Patient will get CT scan abdomen and pelvis so we will do CT scan versus  ultrasound because of the pain being right upper quadrant right lower quadrant.  Patient has a history of gallstones.  Patient is also had a history of diverticulitis.  Lipase here today is normal.  Complete metabolic panel liver function tests are normal electrolytes are normal.  Blood sugar is 142 renal function GFR is greater than 60.  CBC also normal with white count of 5.2 hemoglobin 11.9 platelets 179.   CT scan abdomen and pelvis shows area of narrowing within the ascending colon distal to the cecum as well as an associated area of narrowing in the terminal ileum with focal dilatation of the cecum and proximal ascending colon as well as the small bowel proximal to the terminal ileum these changes are suspicious for internal hernia causing the long segment of narrowing and subsequent small bowel  obstruction these changes are new from prior examination.  Diverticulosis without diverticulitis.  Patient hepatobiliary no gallstones no gallbladder wall thickening no biliary dilatation.   Give IV fluids will give pain medicine antinausea medicine will get CT scan abdomen and pelvis.  Based on the CT scan findings.  Will discuss with general surgery.  CT scan reviewed by on-call general surgery Dr. Freida Busman.  She is recommending hospitalist admission NG tube.  Can be at [facility] or [place].  They will follow.   Final Clinical Impression(s) / ED Diagnoses Final diagnoses:  Right lower quadrant abdominal pain  Partial intestinal obstruction, unspecified cause [facility])    Rx / DC Orders ED Discharge Orders     None         Vanetta Mulders, MD 10/13/22 2206    Vanetta Mulders, MD 10/13/22 2249    Vanetta Mulders, MD 10/13/22 2257

## 2022-10-13 NOTE — ED Notes (Signed)
Patient transported to CT 

## 2022-10-14 ENCOUNTER — Emergency Department (HOSPITAL_BASED_OUTPATIENT_CLINIC_OR_DEPARTMENT_OTHER): Payer: BC Managed Care – PPO

## 2022-10-14 ENCOUNTER — Inpatient Hospital Stay (HOSPITAL_COMMUNITY): Payer: BC Managed Care – PPO

## 2022-10-14 ENCOUNTER — Encounter (HOSPITAL_COMMUNITY): Payer: Self-pay | Admitting: Internal Medicine

## 2022-10-14 ENCOUNTER — Encounter (HOSPITAL_COMMUNITY): Payer: Self-pay

## 2022-10-14 DIAGNOSIS — E039 Hypothyroidism, unspecified: Secondary | ICD-10-CM

## 2022-10-14 DIAGNOSIS — D638 Anemia in other chronic diseases classified elsewhere: Secondary | ICD-10-CM | POA: Diagnosis present

## 2022-10-14 DIAGNOSIS — L509 Urticaria, unspecified: Secondary | ICD-10-CM | POA: Diagnosis present

## 2022-10-14 DIAGNOSIS — I639 Cerebral infarction, unspecified: Secondary | ICD-10-CM | POA: Diagnosis not present

## 2022-10-14 DIAGNOSIS — J309 Allergic rhinitis, unspecified: Secondary | ICD-10-CM | POA: Diagnosis present

## 2022-10-14 DIAGNOSIS — E669 Obesity, unspecified: Secondary | ICD-10-CM | POA: Diagnosis present

## 2022-10-14 DIAGNOSIS — E876 Hypokalemia: Secondary | ICD-10-CM | POA: Diagnosis present

## 2022-10-14 DIAGNOSIS — F411 Generalized anxiety disorder: Secondary | ICD-10-CM | POA: Diagnosis not present

## 2022-10-14 DIAGNOSIS — R5381 Other malaise: Secondary | ICD-10-CM | POA: Diagnosis not present

## 2022-10-14 DIAGNOSIS — I63532 Cerebral infarction due to unspecified occlusion or stenosis of left posterior cerebral artery: Secondary | ICD-10-CM | POA: Diagnosis not present

## 2022-10-14 DIAGNOSIS — E785 Hyperlipidemia, unspecified: Secondary | ICD-10-CM | POA: Diagnosis present

## 2022-10-14 DIAGNOSIS — K5790 Diverticulosis of intestine, part unspecified, without perforation or abscess without bleeding: Secondary | ICD-10-CM

## 2022-10-14 DIAGNOSIS — I69393 Ataxia following cerebral infarction: Secondary | ICD-10-CM | POA: Diagnosis not present

## 2022-10-14 DIAGNOSIS — F319 Bipolar disorder, unspecified: Secondary | ICD-10-CM | POA: Diagnosis present

## 2022-10-14 DIAGNOSIS — D649 Anemia, unspecified: Secondary | ICD-10-CM

## 2022-10-14 DIAGNOSIS — R110 Nausea: Secondary | ICD-10-CM | POA: Diagnosis not present

## 2022-10-14 DIAGNOSIS — I63542 Cerebral infarction due to unspecified occlusion or stenosis of left cerebellar artery: Secondary | ICD-10-CM | POA: Diagnosis not present

## 2022-10-14 DIAGNOSIS — E43 Unspecified severe protein-calorie malnutrition: Secondary | ICD-10-CM | POA: Diagnosis present

## 2022-10-14 DIAGNOSIS — R109 Unspecified abdominal pain: Secondary | ICD-10-CM | POA: Diagnosis not present

## 2022-10-14 DIAGNOSIS — K8020 Calculus of gallbladder without cholecystitis without obstruction: Secondary | ICD-10-CM | POA: Diagnosis present

## 2022-10-14 DIAGNOSIS — R339 Retention of urine, unspecified: Secondary | ICD-10-CM | POA: Diagnosis not present

## 2022-10-14 DIAGNOSIS — I10 Essential (primary) hypertension: Secondary | ICD-10-CM | POA: Diagnosis present

## 2022-10-14 DIAGNOSIS — G935 Compression of brain: Secondary | ICD-10-CM | POA: Diagnosis not present

## 2022-10-14 DIAGNOSIS — F419 Anxiety disorder, unspecified: Secondary | ICD-10-CM | POA: Diagnosis present

## 2022-10-14 DIAGNOSIS — K5901 Slow transit constipation: Secondary | ICD-10-CM | POA: Diagnosis not present

## 2022-10-14 DIAGNOSIS — J439 Emphysema, unspecified: Secondary | ICD-10-CM

## 2022-10-14 DIAGNOSIS — K219 Gastro-esophageal reflux disease without esophagitis: Secondary | ICD-10-CM | POA: Diagnosis present

## 2022-10-14 DIAGNOSIS — K56609 Unspecified intestinal obstruction, unspecified as to partial versus complete obstruction: Secondary | ICD-10-CM | POA: Diagnosis present

## 2022-10-14 DIAGNOSIS — I69391 Dysphagia following cerebral infarction: Secondary | ICD-10-CM | POA: Diagnosis not present

## 2022-10-14 DIAGNOSIS — K222 Esophageal obstruction: Secondary | ICD-10-CM | POA: Diagnosis present

## 2022-10-14 DIAGNOSIS — G8929 Other chronic pain: Secondary | ICD-10-CM | POA: Diagnosis present

## 2022-10-14 DIAGNOSIS — G4700 Insomnia, unspecified: Secondary | ICD-10-CM | POA: Diagnosis present

## 2022-10-14 DIAGNOSIS — H409 Unspecified glaucoma: Secondary | ICD-10-CM | POA: Diagnosis present

## 2022-10-14 DIAGNOSIS — R1319 Other dysphagia: Secondary | ICD-10-CM | POA: Diagnosis not present

## 2022-10-14 DIAGNOSIS — K562 Volvulus: Secondary | ICD-10-CM | POA: Diagnosis present

## 2022-10-14 DIAGNOSIS — G4730 Sleep apnea, unspecified: Secondary | ICD-10-CM | POA: Diagnosis present

## 2022-10-14 DIAGNOSIS — I63442 Cerebral infarction due to embolism of left cerebellar artery: Secondary | ICD-10-CM | POA: Diagnosis not present

## 2022-10-14 DIAGNOSIS — K136 Irritative hyperplasia of oral mucosa: Secondary | ICD-10-CM | POA: Diagnosis not present

## 2022-10-14 DIAGNOSIS — K5730 Diverticulosis of large intestine without perforation or abscess without bleeding: Secondary | ICD-10-CM | POA: Diagnosis present

## 2022-10-14 LAB — HIV ANTIBODY (ROUTINE TESTING W REFLEX): HIV Screen 4th Generation wRfx: NONREACTIVE

## 2022-10-14 LAB — COMPREHENSIVE METABOLIC PANEL
ALT: 16 U/L (ref 0–44)
AST: 21 U/L (ref 15–41)
Albumin: 3.9 g/dL (ref 3.5–5.0)
Alkaline Phosphatase: 78 U/L (ref 38–126)
Anion gap: 8 (ref 5–15)
BUN: 15 mg/dL (ref 8–23)
CO2: 24 mmol/L (ref 22–32)
Calcium: 8.4 mg/dL — ABNORMAL LOW (ref 8.9–10.3)
Chloride: 104 mmol/L (ref 98–111)
Creatinine, Ser: 0.73 mg/dL (ref 0.44–1.00)
GFR, Estimated: >60 mL/min (ref >60)
Glucose, Bld: 138 mg/dL — ABNORMAL HIGH (ref 70–99)
Potassium: 3.7 mmol/L (ref 3.5–5.1)
Sodium: 136 mmol/L (ref 135–145)
Total Bilirubin: 0.5 mg/dL (ref 0.3–1.2)
Total Protein: 6.5 g/dL (ref 6.5–8.1)

## 2022-10-14 LAB — CBC
HCT: 35.2 % — ABNORMAL LOW (ref 36.0–46.0)
Hemoglobin: 10.9 g/dL — ABNORMAL LOW (ref 12.0–15.0)
MCH: 29.1 pg (ref 26.0–34.0)
MCHC: 31 g/dL (ref 30.0–36.0)
MCV: 93.9 fL (ref 80.0–100.0)
Platelets: 151 10*3/uL (ref 150–400)
RBC: 3.75 MIL/uL — ABNORMAL LOW (ref 3.87–5.11)
RDW: 12.5 % (ref 11.5–15.5)
WBC: 4.8 10*3/uL (ref 4.0–10.5)
nRBC: 0 % (ref 0.0–0.2)

## 2022-10-14 LAB — GLUCOSE, CAPILLARY
Glucose-Capillary: 179 mg/dL — ABNORMAL HIGH (ref 70–99)
Glucose-Capillary: 112 mg/dL — ABNORMAL HIGH (ref 70–99)

## 2022-10-14 LAB — MRSA NEXT GEN BY PCR, NASAL: MRSA by PCR Next Gen: NOT DETECTED

## 2022-10-14 MED ORDER — SODIUM CHLORIDE 0.9 % IV SOLN
250.0000 mL | INTRAVENOUS | Status: DC | PRN
  Administered 2022-10-16: 250 mL via INTRAVENOUS

## 2022-10-14 MED ORDER — ONDANSETRON HCL 4 MG PO TABS: 4.0000 mg | ORAL_TABLET | Freq: Four times a day (QID) | ORAL | Status: DC | PRN

## 2022-10-14 MED ORDER — ACETAMINOPHEN 650 MG RE SUPP
650.0000 mg | Freq: Four times a day (QID) | RECTAL | Status: DC | PRN
  Administered 2022-10-15: 650 mg via RECTAL
  Filled 2022-10-14: qty 1

## 2022-10-14 MED ORDER — ENALAPRILAT 1.25 MG/ML IV SOLN: 0.6250 mg | Freq: Four times a day (QID) | INTRAVENOUS | Status: DC | PRN

## 2022-10-14 MED ORDER — CLOPIDOGREL BISULFATE 75 MG PO TABS: 75.0000 mg | ORAL_TABLET | Freq: Every day | ORAL | Status: DC

## 2022-10-14 MED ORDER — DEXTROSE IN LACTATED RINGERS 5 % IV SOLN: INTRAVENOUS | Status: DC

## 2022-10-14 MED ORDER — IOHEXOL 350 MG/ML SOLN
100.0000 mL | Freq: Once | INTRAVENOUS | Status: AC | PRN
  Administered 2022-10-14: 100 mL via INTRAVENOUS

## 2022-10-14 MED ORDER — SODIUM CHLORIDE 0.9 % IV SOLN: INTRAVENOUS | Status: DC

## 2022-10-14 MED ORDER — HYDROMORPHONE HCL 1 MG/ML IJ SOLN
1.0000 mg | INTRAMUSCULAR | Status: DC | PRN
  Administered 2022-10-14 – 2022-10-25 (×5): 1 mg via INTRAVENOUS
  Filled 2022-10-14 (×5): qty 1

## 2022-10-14 MED ORDER — SODIUM CHLORIDE 0.9% FLUSH
3.0000 mL | Freq: Two times a day (BID) | INTRAVENOUS | Status: DC
  Administered 2022-10-14 – 2022-10-17 (×6): 3 mL via INTRAVENOUS

## 2022-10-14 MED ORDER — CLOPIDOGREL BISULFATE 75 MG PO TABS
75.0000 mg | ORAL_TABLET | Freq: Every day | ORAL | Status: DC
  Administered 2022-10-14: 75 mg via NASOGASTRIC
  Filled 2022-10-14: qty 1

## 2022-10-14 MED ORDER — DEXTROSE 50 % IV SOLN
1.0000 | INTRAVENOUS | Status: DC | PRN
  Filled 2022-10-14: qty 50

## 2022-10-14 MED ORDER — ONDANSETRON HCL 4 MG/2ML IJ SOLN
4.0000 mg | Freq: Four times a day (QID) | INTRAMUSCULAR | Status: DC | PRN
  Administered 2022-10-17: 4 mg via INTRAVENOUS
  Filled 2022-10-14: qty 2

## 2022-10-14 MED ORDER — CHLORHEXIDINE GLUCONATE CLOTH 2 % EX PADS
6.0000 | MEDICATED_PAD | Freq: Every day | CUTANEOUS | Status: DC
  Administered 2022-10-15 – 2022-10-25 (×10): 6 via TOPICAL

## 2022-10-14 MED ORDER — ACETAMINOPHEN 325 MG PO TABS: 650.0000 mg | ORAL_TABLET | Freq: Four times a day (QID) | ORAL | Status: DC | PRN

## 2022-10-14 MED ORDER — SODIUM CHLORIDE 0.9% FLUSH: 3.0000 mL | INTRAVENOUS | Status: DC | PRN

## 2022-10-14 MED ORDER — LEVOTHYROXINE SODIUM 100 MCG/5ML IV SOLN: 75.0000 ug | Freq: Every day | INTRAVENOUS | Status: DC

## 2022-10-14 MED ORDER — PANTOPRAZOLE SODIUM 40 MG IV SOLR
40.0000 mg | INTRAVENOUS | Status: DC
  Administered 2022-10-14 – 2022-10-22 (×9): 40 mg via INTRAVENOUS
  Filled 2022-10-14 (×9): qty 10

## 2022-10-14 MED ORDER — ALBUTEROL SULFATE (2.5 MG/3ML) 0.083% IN NEBU
2.5000 mg | INHALATION_SOLUTION | Freq: Four times a day (QID) | RESPIRATORY_TRACT | Status: DC | PRN
  Administered 2022-10-23: 2.5 mg via RESPIRATORY_TRACT
  Filled 2022-10-14: qty 3

## 2022-10-14 NOTE — Assessment & Plan Note (Signed)
No diverticulitis seen on CT.

## 2022-10-14 NOTE — Assessment & Plan Note (Signed)
Stable. Will order prn IV labetalol. If remains with NG for >48 hours, can consider either IV lopressor or catapres patch. Looking at her home meds, pt does not appear to be on any HTN meds at home.

## 2022-10-14 NOTE — Hospital Course (Signed)
HPI: Joan Robinson is a 61 y.o. female with medical history significant of hiatal hernia, status post multiple surgeries, esophageal stricture requiring dilation, GERD/PUD, cholelithiasis, colonic polyp, diverticulosis, hypertension, hyperlipidemia, hypothyroidism, autoimmune urticaria, emphysema,Freiberg's disease and sleep apnea presented to emergency department complaining of right upper quadrant and lower quadrant abdominal pain for 2 days with associated nausea.  Patient reported that she has midepigastric abdominal pain that goes to her right side of the abdomen.  Pain is 4 out of 10 intensity with associated nausea.  Denies any vomiting.  She is able to pass flatus.  She has last bowel movement 1 day ago.  Denies any previous episodes of small bowel obstruction in the past. Patient denies any other complaint at this time.  Significant Events: Admitted 10/13/2022 for SBO   Significant Labs: Admitting lipase normal at 10  Significant Imaging Studies: Admitting CT abd shows Area of narrowing within the ascending colon distal to the cecum as well as an associated area of narrowing in the terminal ileum with focal dilatation of the cecum and proximal ascending colon as well as the small bowel proximal to the terminal ileum. These changes are suspicious for internal hernia causing the long segment of narrowing and subsequent small-bowel obstruction. These changes are new from the prior CT examination(06-04-2022). Diverticulosis without diverticulitis.  Antibiotic Therapy: Anti-infectives (From admission, onward)    None       Procedures:   Consultants: General Surgery

## 2022-10-14 NOTE — Subjective & Objective (Signed)
Pt seen and examined. Husband at bedside. Pt improved overnight with NG tube. Surgery has consulted on patient. No plans for operative intervention at this time.

## 2022-10-14 NOTE — Assessment & Plan Note (Signed)
Add IV protonix.

## 2022-10-14 NOTE — Progress Notes (Signed)
Telestroke Note    1943: Code stroke cart activated at this time by rapid response team for an inpatient code stroke. LKW 1700 per day shift nurse. ROS 1915 during change of shift. Per nursing, patient was altered and experiencing slurred speech, and a left facial droop.mRS 0.Per Dr. Wilford Corner request, TSP services will be paged at this time.   1949: TSP paged. Providers at the bedside assessing patient at this time.   1950: Patient left for CT.   1953: Dr.Stabley on camera. Patient history and report provided to [facility] on camera.   1956: Dr.Stabley speaking to on call attending provider on camera.   2002: Dr. Boston Service performing neuro evaluation. Per Dr. Boston Service, LKW unknown per Dr. Genevieve Norlander assessment.   2014: Dr. Boston Service following up with Chinita Greenland NP to provide recommendations. Advanced imaging ordered.   Derrill Kay Telestroke RN

## 2022-10-14 NOTE — Assessment & Plan Note (Signed)
Stable

## 2022-10-14 NOTE — Consult Note (Addendum)
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  [name] patient's nurse noted her left eye was drooping around 5 PM tonight and subsequently later in the evening someone noted her left face was drooping. According to patient, she felt that something was not right yesterday and was concerned about having a stroke although did not mention it before now. For this reason last known normal uncertain.  On examination, patient is lethargic with dysarthric speech, some confusion/disorientation, she does seem to have some disconjugate gaze with possible partial left lateral rectus palsy and left facial weakness. He also seems to be mildly clumsy with left finger-to-nose movement and reports some diminished sensation in her left arm and possibly right leg. NIHSS = 10. CT head reveals what appears to be an acute left cerebellar infarct as well as possible involvement in the left medulla suspicious for possible acute posterior inferior cerebellar infarct.We will proceed with stat CTA of head and neck,CT perfusion and involve NIR if there is LVO.  Our recommendations are outlined below.  Recommendations:        Stroke/Telemetry Floor       Neuro Checks       Bedside Swallow Eval       DVT Prophylaxis       IV Fluids, Normal Saline       Head of Bed 30 Degrees       Euglycemia and Avoid Hyperthermia (PRN Acetaminophen)       Antihypertensives PRN if Blood pressure is greater than  220/120 or there is a concern for End organ damage/contraindications for permissive HTN. If blood pressure is greater than 220/120 give labetalol PO or IV or Vasotec IV with a goal of 15% reduction in BP during the first 24 hours.       Patient with reported severe aspirin allergy.       Via NG tube start Plavix 75 mg daily.       Suggest MRI brain For further evaluation of stroke.       In addition, given the location of her moderate size left cerebellar infarct, I would recommend transferring to ICU, repeating a CT head early in the morning to rule out interval mass effect in the left cerebellum to rule out risk for development of compression of the fourth ventricle/hydrocephalus which was not present currently. If she would develop increased edema, would need neurosurgical consultation.       Watch closely for airway protection.  Sign Out:       Discussed with Primary Attending       Discussed with Rapid Response Team    ------------------------------------------------------------------------------  Metrics: Last Known Well: Unknown [facility] Notification Time: 10/14/2022 19:49:19 Stamp Time: 10/14/2022 19:49:19 Initial Response Time: 10/14/2022 19:53:38 Symptoms: Left eye droop 1700, then 1950 left facial droop, slurred speech , admitted for SBO , pt felt she had a stroke . Initial patient interaction: 10/14/2022 20:02:57 NIHSS Assessment Completed: 10/14/2022 20:11:32 Patient is not a candidate for Thrombolytic. Thrombolytic Medical Decision: 10/14/2022 20:11:32  Patient was not deemed candidate for Thrombolytic because of following reasons: LKW outside 4.5 hr window. .  I personally Reviewed the CT Head and it Showed Low-density in the medulla and inferior left cerebellum suspicious for acute left PICA infarct. No hemorrhage.  Primary Provider Notified of Diagnostic Impression and Management Plan on: 10/14/2022 20:32:29 Spoke With: Chinita Greenland, NP Able to  Reach 10/14/2022 20:32:29    ------------------------------------------------------------------------------  History of Present Illness: Patient is a 62 year old Female.  Inpatient stroke alert was called for symptoms of Left eye droop 1700, then 1950 left facial droop, slurred speech , admitted for SBO , pt felt she had a stroke . 62 year old female with history of hiatal hernia, history of esophageal stricture, GERD, peptic ulcer disease, diverticulosis, hypertension admitted on 9/28 after presenting with abdominal pain associate with nausea and suspected small bowel obstruction. [name] patient's nurse noted her left eye was drooping around 5 PM tonight and subsequently later in the evening someone noted her left face was drooping. According to patient, she felt that something was not right yesterday and was concerned about having a stroke although did not mention it before now. For this reason last known normal uncertain.    Past Medical History:      Hypertension      Hyperlipidemia Other PMH:  GERD, PUD , diverticulosis , Hypothyroid , Anxiety and depression, autoimmune urticaria, basal cell carcinoma, bipolar disorder, Freiberg's disease, gallstones, Sleep apnea, IBS  Medications:  No Anticoagulant use  No Antiplatelet use Reviewed EMR for current medications Other Medications Pertinent To Assessment Include: Albuterol, acetaminophen, hydromorphone, levothyroxine,, Zofran, pantoprazole,  Allergies:  Reviewed Description: Aspirin, NSAIDs, dicyclomine, metronidazole, Phenergan, quetiapine, Cymbalta, levothyroxine, moxifloxacin, sulfa  Social History: Smoking: Former Drug Use: No  Family History:  There Is Family History Of:CAD, stroke, hypertension, hyperlipidemia, aneurysm,-year-old bowel syndrome, mental illness, colon cancer There is no family history of premature cerebrovascular disease pertinent to this consultation  ROS : 14 Points Review of Systems was performed and  was negative except mentioned in HPI.  Past Surgical History: There Is No Surgical History Contributory To Today's Visit There Is Surgical History of:  Hysterectomy, polypectomy, hiatal hernia repair, eye surgery, vitrectomy, hernia repair, bilateral tubal ligation, uterine suspension     Examination: BP(142/81), Pulse(87), Blood Glucose(179) 1A: Level of Consciousness - Requires repeated stimulation to arouse + 2 1B: Ask Month and Age - 1 Question Right + 1 1C: Blink Eyes & Squeeze Hands - Performs Both Tasks + 0 2: Test Horizontal Extraocular Movements - Partial Gaze Palsy: Can Be Overcome + 1 3: Test Visual Fields - No Visual Loss + 0 4: Test Facial Palsy (Use Grimace if Obtunded) - Partial paralysis (lower face) + 2 5A: Test Left Arm Motor Drift - No Drift for 10 Seconds + 0 5B: Test Right Arm Motor Drift - No Drift for 10 Seconds + 0 6A: Test Left Leg Motor Drift - No Drift for 5 Seconds + 0 6B: Test Right Leg Motor Drift - No Drift for 5 Seconds + 0 7: Test Limb Ataxia (FNF/Heel-Shin) - Ataxia in 1 Limb + 1 8: Test Sensation - Mild-Moderate Loss: Less Sharp/More Dull + 1 9: Test Language/Aphasia - Normal; No aphasia + 0 10: Test Dysarthria - Mild-Moderate Dysarthria: Slurring but can be understood + 1 11: Test Extinction/Inattention - Visual/tactile/auditory/spatial/personal inattention + 1  NIHSS Score: 10  NIHSS Free Text : Different on left arm, right leg  Pre-Morbid Modified Rankin Scale: Unable to assess  Spoke with : Chinita Greenland, NP  This consult was conducted in real time using interactive audio and video technology. Patient was informed of the technology being used for this visit and agreed to proceed. Patient located in hospital and provider located at home/office setting.   Patient is being evaluated for possible acute neurologic impairment and high probability of imminent or life-threatening deterioration. I spent total of 46 minutes providing care to this  patient, including time for face to face visit via telemedicine, review of medical records, imaging studies and discussion of findings with providers, the patient and/or family.   Dr Braulio Bosch   [facility] For Inpatient follow-up with [facility] physician please call [facility] [phone]. This is not an outpatient service. Post hospital discharge, please contact hospital directly.  Please do not communicate with [facility] physicians via secure chat. If you have any questions, Please contact [facility]. Please call or reconsult our service if there are any clinical or diagnostic changes.  Addendum: 1. No carotid bifurcation disease. No anterior circulation pathology seen. 2. No posterior circulation large vessel occlusion or proximal stenosis. Flow appears to be present in both posteroinferior cerebellar arteries presently. Right anterior inferior cerebellar artery is present. Left anterior inferior cerebellar artery is not seen. This could be due to occlusion, hypoplasia or aplasia. 3. No evidence of infarction in the anterior circulation territory. The portion of the cerebellum involved by the infarction is not included in the region of the perfusion exam. 4. Emphysema at the lung apices. Mild dependent pulmonary atelectasis.

## 2022-10-14 NOTE — H&P (Signed)
History and Physical    Joan Robinson ZOX:[id number] DOB: 03-14-1960 DOA: 10/13/2022  PCP: April Manson, NP   Patient coming from: Home   Chief Complaint:  Chief Complaint  Patient presents with   Abdominal Pain    HPI:  Joan Robinson is a 62 y.o. female with medical history significant of hiatal hernia, status post multiple surgeries, esophageal stricture requiring dilation, GERD/PUD, cholelithiasis, colonic polyp, diverticulosis, hypertension, hyperlipidemia, hypothyroidism, autoimmune urticaria, emphysema,Freiberg's disease and sleep apnea presented to emergency department complaining of right upper quadrant and lower quadrant abdominal pain for 2 days with associated nausea.  Patient reported that she has midepigastric abdominal pain that goes to her right side of the abdomen.  Pain is 4 out of 10 intensity with associated nausea.  Denies any vomiting.  She is able to pass flatus.  She has last bowel movement 1 day ago.  Denies any previous episodes of small bowel obstruction in the past. Patient denies any other complaint at this time.  ED Course:  At your initial presentation patient's vitals are stable except slightly hypertensive. Labs shows no leukocytosis, hemoglobin 11.9 stable and no elevation of lipase and LFT. CT abdomen pelvis showed: IMPRESSION: Area of narrowing within the ascending colon distal to the cecum as well as an associated area of narrowing in the terminal ileum with focal dilatation of the cecum and proximal ascending colon as well as the small bowel proximal to the terminal ileum. These changes are suspicious for internal hernia causing the long segment of narrowing and subsequent small-bowel obstruction. These changes are new from the prior CT examination.  Diverticulosis without diverticulitis.  With the diagnosis of a small bowel obstruction General Surgery has been consulted in the ER and recommended NG tube placement and transfer patient either  with longer [facility] for further care.  NG tube has been placed in the ER and chest x-ray showed need to advance the NG tube 6 cm as it is level of gastroesophageal junction.  Chest x-ray showed low lung volume with mild atelectasis lung base, blunting of the left posterior pending ankle possibly pleural effusion.  Requested bedside nurse to advance the NG tube and will do repeat x-ray to verify the position of the NG tube.  Hospitalist has been contacted for further evaluation management and management of a small bowel obstruction.  Review of Systems:  Review of Systems  Constitutional:  Negative for chills, fever, malaise/fatigue and weight loss.  Respiratory:  Negative for cough, sputum production and shortness of breath.   Cardiovascular:  Negative for chest pain and palpitations.  Gastrointestinal:  Positive for abdominal pain and nausea. Negative for blood in stool, constipation, diarrhea, heartburn, melena and vomiting.  Neurological:  Negative for dizziness and headaches.  Psychiatric/Behavioral:  The patient is not nervous/anxious.     Past Medical History:  Diagnosis Date   Allergic rhinitis    Anemia 10/10/2011   Anxiety and depression 01/17/2007   Qualifier: Diagnosis of  By: Everardo All MD, Sean A    Arthritis 07/24/2013   Likely inflammatory and following with Dr Maryln Gottron of [facility]ology   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   BCC (basal cell carcinoma of skin) 06/01/2012   Leg Follows with Dr Margo Aye   Bipolar disorder 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   Hashimoto's disease    Hyperlipidemia    Hypertension    Hypothyroidism 08/24/2006   Qualifier: Diagnosis of  By: Everardo All MD, Sean A     IBS (irritable bowel syndrome)  07/27/2016   Obesity 11/01/2013   PUD (peptic ulcer disease)    Sleep apnea 04/27/2016   Tobacco abuse     Past Surgical History:  Procedure Laterality Date   ABDOMINAL HYSTERECTOMY     ABDOMINAL HYSTERECTOMY  01/15/2009   complete   COLONOSCOPY     DILATION AND CURETTAGE OF UTERUS  01/16/1983   EYE SURGERY  03/03/2014   Surgery on both eyes for epiretinal membrane (vitreous peel)   Gated Spect wall motion stress cardiolite  11/05/2001   HIATAL HERNIA REPAIR N/A 07/12/2021   Procedure: LAPAROSCOPY W/ EXTENSIVE FOREGUT DISSECTION; PARTIAL STOMACH REDUCTION; GASTROSTOMY TUBE PLACEMENT; GASTROPEXY;  Surgeon: Luretha Murphy, MD;  Location: [facility];  Service: General;  Laterality: N/A;   POLYPECTOMY     TUBAL LIGATION  01/15/1993   UTERINE SUSPENSION     mesh   VITRECTOMY Bilateral 03/03/2014   XI ROBOTIC ASSISTED HIATAL HERNIA REPAIR N/A 05/01/2021   Procedure: XI ROBOTIC ASSISTED TYPE III HIATAL HERNIA REPAIR WITH FUNDOPLICATION;  Surgeon: Luretha Murphy, MD;  Location: [facility];  Service: General;  Laterality: N/A;     reports that she quit smoking about 13 years ago. Her smoking use included cigarettes. She started smoking about 43 years ago. She has a 30 pack-year smoking history. She has never used smokeless tobacco. She reports that she does not drink alcohol and does not use drugs.  Allergies  Allergen Reactions   Aspirin Shortness Of Breath   Nsaids Shortness Of Breath   Keflex [Cephalexin] Diarrhea   Butamben-Tetracaine-Benzocaine     Mouth sores   Dicyclomine Hcl     REACTION: mouth ulcers   Metronidazole Hives    mouth ulcers   Phenergan [Promethazine Hcl] Other (See Comments)    "knocks" pt out for about 3 days   Quetiapine     Somnolence. Slept for 36 hours straight.   Benay Spice [Lifitegrast]     Burned eyes   Cymbalta [Duloxetine Hcl] Rash   Levothyroxine Palpitations    Pt must use brand SYNTHROID   Moxifloxacin Nausea Only and Palpitations    REACTION:  increased heart rate   Moxifloxacin Hcl In Nacl Palpitations   Orphenadrine Citrate Palpitations   Sulfamethoxazole-Trimethoprim Nausea Only and Palpitations    REACTION: increased heart rate, nausea    Family History  Problem Relation Age of Onset   Heart disease Mother 105       stents   Stroke Mother    Hyperlipidemia Mother    Hypertension Mother    Other Mother        blood disorder- mgus   Colon polyps Father    Other Father        aorta disection   Aneurysm Father    Hypertension Sister        ?   Arthritis Sister    Other Sister        thyroid   Other Sister        thyroid   Irritable bowel syndrome Daughter    Other Daughter        gastritis   Mental illness Daughter        bipolar and mood disorder   Mental illness Son  bipolar   Colon cancer Paternal Uncle     Prior to Admission medications   Medication Sig Start Date End Date Taking? Authorizing Provider  calcium carbonate (TUMS) 500 MG chewable tablet Chew 1-2 tablets by mouth as needed.    [provider]  cetirizine (ZYRTEC) 10 MG tablet Take 10 mg by mouth as needed. 06/14/20   [provider]  Cholecalciferol (D 1000) 25 MCG (1000 UT) capsule Take 1,000 Units by mouth daily. 05/09/16   [provider]  estradiol (ESTRACE) 0.1 MG/GM vaginal cream Place 1 Applicatorful vaginally 2 (two) times a week. 08/02/22   [provider]  fexofenadine (ALLEGRA) 180 MG tablet Take 180 mg by mouth as needed. 10/27/12   [provider]  HYDROcodone-acetaminophen (NORCO) 10-325 MG tablet Take 1 tablet by mouth every 8 (eight) hours as needed. 08/20/22 08/20/23  [provider]  latanoprost (XALATAN) 0.005 % ophthalmic solution Place 1 drop into both eyes at bedtime. 02/06/16   [provider]  lisdexamfetamine (VYVANSE) 70 MG capsule Take 70 mg by mouth daily. 09/20/20   [provider]  nitroGLYCERIN (NITROSTAT) 0.4 MG SL tablet Place 0.4 mg under the  tongue every 5 (five) minutes as needed. 03/01/15   [provider]  ondansetron (ZOFRAN) 4 MG tablet Take 1 tablet (4 mg total) by mouth every 8 (eight) hours as needed for nausea or vomiting. Take 1-2 tablets every 4-6 hours as needed for nausea 05/03/21   Luretha Murphy, MD  pantoprazole (PROTONIX) 40 MG tablet Take 1 tablet (40 mg total) by mouth daily. 09/04/22   Hilarie Fredrickson, MD  raNITIdine HCl (RANITIDINE 75 PO) Take 1 tablet by mouth as needed (Hives). 07/23/14   [provider]  rosuvastatin (CRESTOR) 20 MG tablet Take 1 tablet (20 mg total) by mouth daily. 09/21/22 12/20/22  Rollene Rotunda, MD  SYNTHROID 88 MCG tablet Take 88 mcg by mouth daily before breakfast. 12/09/12   [provider]     Physical Exam: Vitals:   10/14/22 0046 10/14/22 0356 10/14/22 0506 10/14/22 0532  BP: 135/86  120/86   Pulse: 72  91   Resp: 16  15   Temp: (!) 97.5 F (36.4 C)  97.8 F (36.6 C)   TempSrc: Oral  Oral   SpO2: 94%  (!) 87% 96%  Weight:  73.1 kg    Height:        Physical Exam Constitutional:      General: She is not in acute distress.    Appearance: She is ill-appearing.  HENT:     Mouth/Throat:     Mouth: Mucous membranes are moist.  Cardiovascular:     Rate and Rhythm: Normal rate and regular rhythm.     Heart sounds: Normal heart sounds.  Pulmonary:     Breath sounds: Normal breath sounds.  Abdominal:     General: Bowel sounds are decreased.     Palpations: Abdomen is soft.     Tenderness: There is generalized abdominal tenderness and tenderness in the epigastric area. There is no guarding or rebound. Negative signs include Murphy's sign.     Hernia: No hernia is present.  Skin:    Capillary Refill: Capillary refill takes less than 2 seconds.     Findings: No erythema or rash.  Neurological:     Mental Status: She is oriented to person, place, and time.  Psychiatric:        Mood and Affect: Mood normal. Mood is not anxious.  Labs on  Admission: I have personally reviewed following labs and imaging studies  CBC: Recent Labs  Lab 10/13/22 2116 10/14/22 0457  WBC 5.2 4.8  HGB 11.9* 10.9*  HCT 36.0 35.2*  MCV 90.2 93.9  PLT 179 151   Basic Metabolic Panel: Recent Labs  Lab 10/13/22 2116 10/14/22 0457  NA 139 136  K 3.8 3.7  CL 104 104  CO2 27 24  GLUCOSE 142* 138*  BUN 16 15  CREATININE 0.97 0.73  CALCIUM 9.3 8.4*   GFR: Estimated Creatinine Clearance: 75.5 mL/min (by C-G formula based on SCr of 0.73 mg/dL). Liver Function Tests: Recent Labs  Lab 10/13/22 2116 10/14/22 0457  AST 20 21  ALT 16 16  ALKPHOS 78 78  BILITOT 0.5 0.5  PROT 7.0 6.5  ALBUMIN 4.6 3.9   Recent Labs  Lab 10/13/22 2116  LIPASE 10*   No results for input(s): "AMMONIA" in the last 168 hours. Coagulation Profile: No results for input(s): "INR", "PROTIME" in the last 168 hours. Cardiac Enzymes: No results for input(s): "CKTOTAL", "CKMB", "CKMBINDEX", "TROPONINI", "TROPONINIHS" in the last 168 hours. BNP (last 3 results) No results for input(s): "BNP" in the last 8760 hours. HbA1C: No results for input(s): "HGBA1C" in the last 72 hours. CBG: No results for input(s): "GLUCAP" in the last 168 hours. Lipid Profile: No results for input(s): "CHOL", "HDL", "LDLCALC", "TRIG", "CHOLHDL", "LDLDIRECT" in the last 72 hours. Thyroid Function Tests: No results for input(s): "TSH", "T4TOTAL", "FREET4", "T3FREE", "THYROIDAB" in the last 72 hours. Anemia Panel: No results for input(s): "VITAMINB12", "FOLATE", "FERRITIN", "TIBC", "IRON", "RETICCTPCT" in the last 72 hours. Urine analysis:    Component Value Date/Time   COLORURINE YELLOW 06/04/2022 1703   APPEARANCEUR CLEAR 06/04/2022 1703   LABSPEC 1.010 06/04/2022 1703   PHURINE 7.0 06/04/2022 1703   GLUCOSEU NEGATIVE 06/04/2022 1703   GLUCOSEU 100 (A) 05/01/2016 1650   HGBUR NEGATIVE 06/04/2022 1703   BILIRUBINUR NEGATIVE 06/04/2022 1703   BILIRUBINUR negative 02/02/2017 1443    BILIRUBINUR neg 01/04/2012 0951   KETONESUR NEGATIVE 06/04/2022 1703   PROTEINUR NEGATIVE 06/04/2022 1703   UROBILINOGEN 0.2 02/02/2017 1443   UROBILINOGEN 2.0 (A) 05/01/2016 1650   NITRITE NEGATIVE 06/04/2022 1703   LEUKOCYTESUR MODERATE (A) 06/04/2022 1703    Radiological Exams on Admission: I have personally reviewed images DG Abd 1 View  Result Date: 10/14/2022 CLINICAL DATA:  NG tube placement. EXAM: ABDOMEN - 1 VIEW COMPARISON:  09/21/2022. FINDINGS: There are mildly distended loops of small bowel in the abdomen measuring up to 3.3 cm. An enteric tube terminates in the stomach. Mild atelectasis is noted at the left lung base and there is a small left pleural effusion. No radio-opaque calculi. IMPRESSION: 1. Mildly distended loops of small bowel in the upper abdomen measuring up to 3.3 cm. 2. Enteric tube terminates in the stomach. Electronically Signed   By: Thornell Sartorius M.D.   On: 10/14/2022 02:07   DG Chest 1 View  Result Date: 10/14/2022 CLINICAL DATA:  NG tube placement. EXAM: CHEST  1 VIEW COMPARISON:  A 03/06/2021. FINDINGS: The heart is enlarged and mediastinal contours are within normal limits. There is atherosclerotic calcification of the aorta. Lung volumes are low. There is atelectasis at the left lung base. There is mild blunting of the left costophrenic angle, possible small pleural effusion. No obvious pneumothorax, however the lung apices are excluded from the field-of-view. On image 2 the side port of the enteric tube is at the level of the  diaphragm and should be advanced 6 cm. Distended loops of small bowel are noted in the upper abdomen measuring up to 3.2 cm. IMPRESSION: 1. Low lung volumes with mild atelectasis at the left lung base. 2. Blunting of the left costophrenic angle, possible small pleural effusion. 3. Dilated loops of small bowel in the abdomen, possible ileus versus obstruction. 4. The side port of the enteric tube is at the gastroesophageal junction and  should be advanced 6 cm. Electronically Signed   By: Thornell Sartorius M.D.   On: 10/14/2022 00:26   CT ABDOMEN PELVIS W CONTRAST  Result Date: 10/13/2022 CLINICAL DATA:  Right-sided abdominal pain for 2 days, initial encounter EXAM: CT ABDOMEN AND PELVIS WITH CONTRAST TECHNIQUE: Multidetector CT imaging of the abdomen and pelvis was performed using the standard protocol following bolus administration of intravenous contrast. RADIATION DOSE REDUCTION: This exam was performed according to the departmental dose-optimization program which includes automated exposure control, adjustment of the mA and/or kV according to patient size and/or use of iterative reconstruction technique. CONTRAST:  OMNIPAQUE IOHEXOL 300 MG/ML  SOLN COMPARISON:  06/04/2022 FINDINGS: Lower chest: No acute abnormality. Hepatobiliary: No focal liver abnormality is seen. No gallstones, gallbladder wall thickening, or biliary dilatation. Pancreas: Unremarkable. No pancreatic ductal dilatation or surrounding inflammatory changes. Spleen: Normal in size without focal abnormality. Adrenals/Urinary Tract: Adrenal glands are within normal limits. Kidneys demonstrate a normal enhancement pattern bilaterally. No renal calculi or obstructive changes are seen. The bladder is decompressed. Stomach/Bowel: Scattered diverticular change of the colon is noted without evidence of diverticulitis. There is a long segment of narrowing within the ascending colon best seen in the sagittal plane on image number 36 of series 6. No discrete mass is seen. These changes may be related to an internal hernia with dilatation of the cecum just proximal to this long segment of narrowing. Mild inflammatory changes are seen. Additionally there is narrowing of the terminal ileum associated with the area of narrowing in the ascending colon. Multiple fluid-filled dilated loops of small bowel are noted proximal to this in the ileum. The jejunum and stomach appear within normal  limits. Vascular/Lymphatic: Aortic atherosclerosis. No enlarged abdominal or pelvic lymph nodes. Reproductive: Status post hysterectomy. No adnexal masses. Other: No abdominal wall hernia or abnormality. No abdominopelvic ascites. Musculoskeletal: No acute or significant osseous findings. IMPRESSION: Area of narrowing within the ascending colon distal to the cecum as well as an associated area of narrowing in the terminal ileum with focal dilatation of the cecum and proximal ascending colon as well as the small bowel proximal to the terminal ileum. These changes are suspicious for internal hernia causing the long segment of narrowing and subsequent small-bowel obstruction. These changes are new from the prior CT examination. Diverticulosis without diverticulitis. Electronically Signed   By: Alcide Clever M.D.   On: 10/13/2022 22:31       Assessment/Plan: Principal Problem:   SBO (small bowel obstruction) (HCC) Active Problems:   Hypothyroidism   Hyperlipidemia   GERD (gastroesophageal reflux disease)   History of esophageal stricture   Chronic anemia   Sleep apnea   Diverticulosis   History of emphysema (HCC)    Assessment and Plan: Small bowel obstruction History of ventral hernia status post surgical repair x 2 2023 Diverticulosis -Patient presenting to the ER with complaining of abdominal pain and nausea for last 1 to 2 days however the abdominal pain has been ongoing for few weeks per patient report.  Denies any vomiting.  Last bowel  movement 9/28 and able to pass flatus.  Abdominal pain 4 out of 10 intensity. -Vital signs stable on presentation. -CBC except chronic anemia and CMP grossly unremarkable.  Lipase within normal range. - CT abdomen pelvis Area of narrowing within the ascending colon distal to the cecum as well as an associated area of narrowing in the terminal ileum with focal dilatation of the cecum and proximal ascending colon as well as the small bowel proximal to the  terminal ileum. These changes are suspicious for internal hernia causing the long segment of narrowing and subsequent small-bowel obstruction. These changes are new from the prior CT examination.  Diverticulosis without diverticulitis. - With the finding of a small bowel obstruction ED physician spoke with general surgery Dr. Freida Busman and recommended NG tube placement and transfer [facility] to [facility] or [facility] to hospitalist care.  -Reposition NG tube and confirm with abdomen x-ray. - Continue NG tube with intermittent suction. - Reported intolerance to morphine.  Continue Dilaudid 1 mg every 3 hours as needed for moderate-severe pain. - Giving patient n.p.o. - Continue D5 LR 100 cc/h. -Would follow-up with general surgery for further recommendation.  Appreciate general surgery input  History of GERD -Currently NPO.  Holding Protonix.  Sleep apnea -Continue nocturnal supplemental [place] oxygen keep O2 sat above 92%.  History of emphysema -Stable - Continue to monitor pulse ox and supplemental oxygen as needed. -Continue albuterol as needed for any wheezing or shortness of breath.  Hypothyroidism -Given patient is n.p.o. continue IV levothyroxine 75 mcg daily  Hyperlipidemia Due to n.p.o. holding Lipitor.  Chronic anemia - Stable H&H 11.9 and 36. -Not on any oral iron supplement at home. Continue to monitor.  Of note, per chart review found out that patient has Lisdexamfetamine.  Need to verify with pharmacy before restarting it.   DVT prophylaxis:  SCDs.  Defer pharmacological prophylaxis in case patient need any surgical intervention. Code Status:  Full Code Diet: Currently n.p.o. with ice chips and as needed oral medications Family Communication: Patient's family member at the bedside updated Disposition Plan: Pending improvement of SBO.  Tentative discharge to home next 2 to 3 days Consults: General Surgery Admission status:   Inpatient, Med-Surg  Severity of Illness: The  appropriate patient status for this patient is INPATIENT. Inpatient status is judged to be reasonable and necessary in order to provide the required intensity of service to ensure the patient's safety. The patient's presenting symptoms, physical exam findings, and initial radiographic and laboratory data in the context of their chronic comorbidities is felt to place them at high risk for further clinical deterioration. Furthermore, it is not anticipated that the patient will be medically stable for discharge from the hospital within 2 midnights of admission.   * I certify that at the point of admission it is my clinical judgment that the patient will require inpatient hospital care spanning beyond 2 midnights from the point of admission due to high intensity of service, high risk for further deterioration and high frequency of surveillance required.Marland Kitchen    Tereasa Coop, MD [facility]  How to contact the [facility] Attending or Consulting provider 7A - 7P or covering provider during after hours 7P -7A, for this patient.  Check the care team in [facility] and look for a) attending/consulting [facility] provider listed and b) the [facility] team listed Log into [email or web address] and use [place]'s universal password to access. If you do not have the password, please contact the hospital operator. Locate the [facility] provider you  are looking for under [facility] and page to a number that you can be directly reached. If you still have difficulty reaching the provider, please page the [facility] (Director on Call) for the Hospitalists listed on amion for assistance.  10/14/2022, 5:39 AM

## 2022-10-14 NOTE — Significant Event (Addendum)
  Rapid response called at 7:45 PM for code stroke.  At the bedside patient's nurse reported that they have noticed patient has left-sided eye drooping 5 PM tonight and subsequently at around the evening someone noticed left-sided face drooping as well.  Patient reported she was not feeling well since morning to afternoon today.  Reported some left-sided burning sensation in her face, left-sided ear ringing and sound abnormality.  During  evaluation at bedside patient is alert and oriented however she has some dysarthria and lethargy.  Unknown last time of finding well.  -Teleneurology immediately has been consulted for evaluation.  Per teleneurology recommendation CT head and CTA head and neck obtained.  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 The involvement of the brainstem appears quite extensive. No evidence of hemorrhage. No hydrocephalus. CTA head and neck no acute vessel occlusion.  Teleneurology Dr. Virgil Benedict recommended to start Plavix 75 mg daily via NG tube.  Patient is allergic to aspirin.  Neurocheck every 4 hours.  IV fluid.  Permissive hypertension for next 24-hour.  Also suggested to follow-up with repeat CT head and MRI in the morning. -Consulted our inpatient neurology Dr.Khaliqdina, neurology recommended to transfer patient to [facility] to neuro ICU care.  And neurology will be primary care team for the patient. -Continue Plavix 75 mg daily  -Continue Vasotec 0.625 mg every 6 hour as needed for systolic blood pressure more than 220 or diastolic blood pressure more than 120 with goal to reach decrease 15% reduction of blood pressure in first 24 hours. -Checking lipid panel and A1c. -Continue neurocheck every 4 hours -Will do bedside swallow screen and speech evaluation. -Will keep head of the bed elevated and continue aspiration precaution.  Tereasa Coop,  MD [facility] 10/14/2022, 10:08 PM

## 2022-10-14 NOTE — Assessment & Plan Note (Signed)
Admitted for SBO. Now has NG tube. Continue with IVF. Management per surgical service.

## 2022-10-14 NOTE — Progress Notes (Signed)
PROGRESS NOTE    Joan Robinson  URK:[id number] DOB: [birth year] DOA: 10/13/2022 PCP: April Manson, NP  Subjective: Pt seen and examined. Husband at bedside. Pt improved overnight with NG tube. Surgery has consulted on patient. No plans for operative intervention at this time.   Hospital Course: HPI: Joan Robinson is a 62 y.o. female with medical history significant of hiatal hernia, status post multiple surgeries, esophageal stricture requiring dilation, GERD/PUD, cholelithiasis, colonic polyp, diverticulosis, hypertension, hyperlipidemia, hypothyroidism, autoimmune urticaria, emphysema,Freiberg's disease and sleep apnea presented to emergency department complaining of right upper quadrant and lower quadrant abdominal pain for 2 days with associated nausea.  Patient reported that she has midepigastric abdominal pain that goes to her right side of the abdomen.  Pain is 4 out of 10 intensity with associated nausea.  Denies any vomiting.  She is able to pass flatus.  She has last bowel movement 1 day ago.  Denies any previous episodes of small bowel obstruction in the past. Patient denies any other complaint at this time.  Significant Events: Admitted 10/13/2022 for SBO   Significant Labs: Admitting lipase normal at 10  Significant Imaging Studies: Admitting CT abd shows Area of narrowing within the ascending colon distal to the cecum as well as an associated area of narrowing in the terminal ileum with focal dilatation of the cecum and proximal ascending colon as well as the small bowel proximal to the terminal ileum. These changes are suspicious for internal hernia causing the long segment of narrowing and subsequent small-bowel obstruction. These changes are new from the prior CT examination(06-04-2022). Diverticulosis without diverticulitis.  Antibiotic Therapy: Anti-infectives (From admission, onward)    None       Procedures:   Consultants: General Surgery    Assessment  and Plan: * SBO (small bowel obstruction) (HCC) Admitted for SBO. Now has NG tube. Continue with IVF. Management per surgical service.  History of emphysema (HCC) Stable.  Diverticulosis No diverticulitis seen on CT.  Sleep apnea Stable.  Chronic anemia Stable HgB  History of esophageal stricture Stable.  GERD (gastroesophageal reflux disease) Add IV protonix.  Essential hypertension Stable. Will order prn IV labetalol. If remains with NG for >48 hours, can consider either IV lopressor or catapres patch. Looking at her home meds, pt does not appear to be on any HTN meds at home.  Hyperlipidemia Stable.   Hypothyroidism Stable. Can hold synthroid while she has NG tube. No need for IV synthroid at this time.   DVT prophylaxis: SCDs Start: 10/14/22 0126 Place TED hose Start: 10/14/22 0126    Code Status: Full Code Family Communication: discussed with pt and husband at bedside Disposition Plan: return home Reason for continuing need for hospitalization: remains with NG tube. Remains on IVF.  Objective: Vitals:   10/14/22 0356 10/14/22 0506 10/14/22 0532 10/14/22 0850  BP:  120/86  119/84  Pulse:  91  98  Resp:  15  14  Temp:  97.8 F (36.6 C)  98.6 F (37 C)  TempSrc:  Oral  Oral  SpO2:  (!) 87% 96% 94%  Weight: 73.1 kg     Height:        Intake/Output Summary (Last 24 hours) at 10/14/2022 1013 Last data filed at 10/14/2022 0901 Gross per 24 hour  Intake 796.39 ml  Output 150 ml  Net 646.39 ml   Filed Weights   10/13/22 2110 10/14/22 0356  Weight: 72.6 kg 73.1 kg    Examination:  Physical Exam Vitals and  nursing note reviewed.  Constitutional:      General: She is not in acute distress.    Appearance: She is not toxic-appearing or diaphoretic.  HENT:     Head: Normocephalic and atraumatic.     Nose: Nose normal.  Eyes:     General: No scleral icterus. Cardiovascular:     Rate and Rhythm: Normal rate and regular rhythm.  Pulmonary:      Effort: Pulmonary effort is normal.     Breath sounds: Normal breath sounds.  Abdominal:     General: Bowel sounds are normal. There is no distension.     Tenderness: There is no abdominal tenderness.  Musculoskeletal:     Right lower leg: No edema.     Left lower leg: No edema.  Skin:    General: Skin is warm and dry.     Capillary Refill: Capillary refill takes less than 2 seconds.     Data Reviewed: I have personally reviewed following labs and imaging studies  CBC: Recent Labs  Lab 10/13/22 2116 10/14/22 0457  WBC 5.2 4.8  HGB 11.9* 10.9*  HCT 36.0 35.2*  MCV 90.2 93.9  PLT 179 151   Basic Metabolic Panel: Recent Labs  Lab 10/13/22 2116 10/14/22 0457  NA 139 136  K 3.8 3.7  CL 104 104  CO2 27 24  GLUCOSE 142* 138*  BUN 16 15  CREATININE 0.97 0.73  CALCIUM 9.3 8.4*   GFR: Estimated Creatinine Clearance: 75.5 mL/min (by C-G formula based on SCr of 0.73 mg/dL). Liver Function Tests: Recent Labs  Lab 10/13/22 2116 10/14/22 0457  AST 20 21  ALT 16 16  ALKPHOS 78 78  BILITOT 0.5 0.5  PROT 7.0 6.5  ALBUMIN 4.6 3.9   Recent Labs  Lab 10/13/22 2116  LIPASE 10*   Radiology Studies: DG Abd 1 View  Result Date: 10/14/2022 CLINICAL DATA:  NG tube placement. EXAM: ABDOMEN - 1 VIEW COMPARISON:  09/21/2022. FINDINGS: There are mildly distended loops of small bowel in the abdomen measuring up to 3.3 cm. An enteric tube terminates in the stomach. Mild atelectasis is noted at the left lung base and there is a small left pleural effusion. No radio-opaque calculi. IMPRESSION: 1. Mildly distended loops of small bowel in the upper abdomen measuring up to 3.3 cm. 2. Enteric tube terminates in the stomach. Electronically Signed   By: Thornell Sartorius M.D.   On: 10/14/2022 02:07   DG Chest 1 View  Result Date: 10/14/2022 CLINICAL DATA:  NG tube placement. EXAM: CHEST  1 VIEW COMPARISON:  A 03/06/2021. FINDINGS: The heart is enlarged and mediastinal contours are within normal  limits. There is atherosclerotic calcification of the aorta. Lung volumes are low. There is atelectasis at the left lung base. There is mild blunting of the left costophrenic angle, possible small pleural effusion. No obvious pneumothorax, however the lung apices are excluded from the field-of-view. On image 2 the side port of the enteric tube is at the level of the diaphragm and should be advanced 6 cm. Distended loops of small bowel are noted in the upper abdomen measuring up to 3.2 cm. IMPRESSION: 1. Low lung volumes with mild atelectasis at the left lung base. 2. Blunting of the left costophrenic angle, possible small pleural effusion. 3. Dilated loops of small bowel in the abdomen, possible ileus versus obstruction. 4. The side port of the enteric tube is at the gastroesophageal junction and should be advanced 6 cm. Electronically Signed   By:  Thornell Sartorius M.D.   On: 10/14/2022 00:26   CT ABDOMEN PELVIS W CONTRAST  Result Date: 10/13/2022 CLINICAL DATA:  Right-sided abdominal pain for 2 days, initial encounter EXAM: CT ABDOMEN AND PELVIS WITH CONTRAST TECHNIQUE: Multidetector CT imaging of the abdomen and pelvis was performed using the standard protocol following bolus administration of intravenous contrast. RADIATION DOSE REDUCTION: This exam was performed according to the departmental dose-optimization program which includes automated exposure control, adjustment of the mA and/or kV according to patient size and/or use of iterative reconstruction technique. CONTRAST:  OMNIPAQUE IOHEXOL 300 MG/ML  SOLN COMPARISON:  06/04/2022 FINDINGS: Lower chest: No acute abnormality. Hepatobiliary: No focal liver abnormality is seen. No gallstones, gallbladder wall thickening, or biliary dilatation. Pancreas: Unremarkable. No pancreatic ductal dilatation or surrounding inflammatory changes. Spleen: Normal in size without focal abnormality. Adrenals/Urinary Tract: Adrenal glands are within normal limits. Kidneys  demonstrate a normal enhancement pattern bilaterally. No renal calculi or obstructive changes are seen. The bladder is decompressed. Stomach/Bowel: Scattered diverticular change of the colon is noted without evidence of diverticulitis. There is a long segment of narrowing within the ascending colon best seen in the sagittal plane on image number 36 of series 6. No discrete mass is seen. These changes may be related to an internal hernia with dilatation of the cecum just proximal to this long segment of narrowing. Mild inflammatory changes are seen. Additionally there is narrowing of the terminal ileum associated with the area of narrowing in the ascending colon. Multiple fluid-filled dilated loops of small bowel are noted proximal to this in the ileum. The jejunum and stomach appear within normal limits. Vascular/Lymphatic: Aortic atherosclerosis. No enlarged abdominal or pelvic lymph nodes. Reproductive: Status post hysterectomy. No adnexal masses. Other: No abdominal wall hernia or abnormality. No abdominopelvic ascites. Musculoskeletal: No acute or significant osseous findings. IMPRESSION: Area of narrowing within the ascending colon distal to the cecum as well as an associated area of narrowing in the terminal ileum with focal dilatation of the cecum and proximal ascending colon as well as the small bowel proximal to the terminal ileum. These changes are suspicious for internal hernia causing the long segment of narrowing and subsequent small-bowel obstruction. These changes are new from the prior CT examination. Diverticulosis without diverticulitis. Electronically Signed   By: Alcide Clever M.D.   On: 10/13/2022 22:31    Scheduled Meds:  [START ON 10/21/2022] levothyroxine  75 mcg Intravenous Daily   pantoprazole (PROTONIX) IV  40 mg Intravenous Q24H   sodium chloride flush  3 mL Intravenous Q12H   Continuous Infusions:  sodium chloride     dextrose 5% lactated ringers 100 mL/hr at 10/14/22 0154      LOS: 0 days   Time spent: 40 minutes  Carollee Herter, DO  [facility]  10/14/2022, 10:13 AM

## 2022-10-14 NOTE — Progress Notes (Signed)
Hooked NGT to low intermittent suction.

## 2022-10-14 NOTE — Assessment & Plan Note (Signed)
Stable  HgB 

## 2022-10-14 NOTE — Progress Notes (Signed)
Telestroke Note    2102: CTA/CTP imaging results provided to Dr. [place] Service at this time.    Derrill Kay Telestroke RN

## 2022-10-14 NOTE — Consult Note (Signed)
Reason for Consult: Abdominal pain Referring Physician: Dr. Payton Doughty Joan Robinson is an 62 y.o. female.  HPI: Patient is a 62 year old female who comes in secondary to 2-week history of abdominal pain that was mainly to the right side.  Discussed with the patient's husband Joan Robinson has been having ongoing increasing abdominal pain.  Apparently the pain became worse overnight which brought to the ER.  I discussed with the patient's husband and Joan Robinson has had no nausea or vomiting at home.  Joan Robinson has been having some diarrhea over the last several weeks.  Upon evaluation in the ER Joan Robinson underwent CT scan.  This was concern for possible internal hernia at the terminal ileum, cecal area.  [name]  NG tube was placed and patient was transferred to [facility] for further evaluation and management.  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   Gallstones    GERD (gastroesophageal reflux disease)    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   Hashimoto's disease    Hyperlipidemia    Hypertension    Hypothyroidism 08/24/2006   Qualifier: Diagnosis of  By: Everardo All MD, Sean A     IBS (irritable bowel syndrome) 07/27/2016   Obesity 11/01/2013   PUD (peptic ulcer disease)    Sleep apnea 04/27/2016   Tobacco abuse     Past Surgical History:  Procedure Laterality Date   ABDOMINAL HYSTERECTOMY     ABDOMINAL HYSTERECTOMY  01/15/2009    complete   COLONOSCOPY     DILATION AND CURETTAGE OF UTERUS  01/16/1983   EYE SURGERY  03/03/2014   Surgery on both eyes for epiretinal membrane (vitreous peel)   Gated Spect wall motion stress cardiolite  11/05/2001   HIATAL HERNIA REPAIR N/A 07/12/2021   Procedure: LAPAROSCOPY W/ EXTENSIVE FOREGUT DISSECTION; PARTIAL STOMACH REDUCTION; GASTROSTOMY TUBE PLACEMENT; GASTROPEXY;  Surgeon: Luretha Murphy, MD;  Location: [facility];  Service: General;  Laterality: N/A;   POLYPECTOMY     TUBAL LIGATION  01/15/1993   UTERINE SUSPENSION     mesh   VITRECTOMY Bilateral 03/03/2014   XI ROBOTIC ASSISTED HIATAL HERNIA REPAIR N/A 05/01/2021   Procedure: XI ROBOTIC ASSISTED TYPE III HIATAL HERNIA REPAIR WITH FUNDOPLICATION;  Surgeon: Luretha Murphy, MD;  Location: [facility];  Service: General;  Laterality: N/A;    Family History  Problem Relation Age of Onset   Heart disease Mother 33       stents   Stroke Mother    Hyperlipidemia Mother    Hypertension Mother    Other Mother        blood disorder- mgus   Colon polyps Father    Other Father        aorta disection   Aneurysm Father    Hypertension Sister        ?   Arthritis Sister  Other Sister        thyroid   Other Sister        thyroid   Irritable bowel syndrome Daughter    Other Daughter        gastritis   Mental illness Daughter        bipolar and mood disorder   Mental illness Son        bipolar   Colon cancer Paternal Uncle     Social History:  reports that Joan Robinson quit smoking about 13 years ago. Her smoking use included cigarettes. Joan Robinson started smoking about 43 years ago. Joan Robinson has a 30 pack-year smoking history. Joan Robinson has never used smokeless tobacco. Joan Robinson reports that Joan Robinson does not drink alcohol and does not use drugs.  Allergies:  Allergies  Allergen Reactions   Aspirin Shortness Of Breath   Nsaids Shortness Of Breath   Keflex [Cephalexin] Diarrhea   Butamben-Tetracaine-Benzocaine     Mouth sores   Dicyclomine Hcl      REACTION: mouth ulcers   Metronidazole Hives    mouth ulcers   Phenergan [Promethazine Hcl] Other (See Comments)    "knocks" pt out for about 3 days   Quetiapine     Somnolence. Slept for 36 hours straight.   Benay Spice [Lifitegrast]     Burned eyes   Cymbalta [Duloxetine Hcl] Rash   Levothyroxine Palpitations    Pt must use brand SYNTHROID   Moxifloxacin Nausea Only and Palpitations    REACTION: increased heart rate   Moxifloxacin Hcl In Nacl Palpitations   Orphenadrine Citrate Palpitations   Sulfamethoxazole-Trimethoprim Nausea Only and Palpitations    REACTION: increased heart rate, nausea    Medications: I have reviewed the patient's current medications.  Results for orders placed or performed during the hospital encounter of 10/13/22 (from the past 48 hour(s))  Lipase, blood     Status: Abnormal   Collection Time: 10/13/22  9:16 PM  Result Value Ref Range   Lipase 10 (L) 11 - 51 U/L    Comment: Performed at [facility], [address] [place], [place] [address]  Comprehensive metabolic panel     Status: Abnormal   Collection Time: 10/13/22  9:16 PM  Result Value Ref Range   Sodium 139 135 - 145 mmol/L   Potassium 3.8 3.5 - 5.1 mmol/L   Chloride 104 98 - 111 mmol/L   CO2 27 22 - 32 mmol/L   Glucose, Bld 142 (H) 70 - 99 mg/dL    Comment: Glucose reference range applies only to samples taken after fasting for at least 8 hours.   BUN 16 8 - 23 mg/dL   Creatinine, Ser 6.04 0.44 - 1.00 mg/dL   Calcium 9.3 8.9 - 54.0 mg/dL   Total Protein 7.0 6.5 - 8.1 g/dL   Albumin 4.6 3.5 - 5.0 g/dL   AST 20 15 - 41 U/L   ALT 16 0 - 44 U/L   Alkaline Phosphatase 78 38 - 126 U/L   Total Bilirubin 0.5 0.3 - 1.2 mg/dL   GFR, Estimated >98 >11 mL/min    Comment: (NOTE) Calculated using the CKD-EPI Creatinine Equation (2021)    Anion gap 8 5 - 15    Comment: Performed at [facility], [address] [place], [place] [address]  CBC     Status:  Abnormal   Collection Time: 10/13/22  9:16 PM  Result Value Ref Range   WBC 5.2 4.0 - 10.5 K/uL   RBC 3.99 3.87 -  5.11 MIL/uL   Hemoglobin 11.9 (L) 12.0 - 15.0 g/dL   HCT 81.1 91.4 - 78.2 %   MCV 90.2 80.0 - 100.0 fL   MCH 29.8 26.0 - 34.0 pg   MCHC 33.1 30.0 - 36.0 g/dL   RDW 95.6 21.3 - 08.6 %   Platelets 179 150 - 400 K/uL   nRBC 0.0 0.0 - 0.2 %    Comment: Performed at [facility], [address] [place], [place] [address]  Comprehensive metabolic panel     Status: Abnormal   Collection Time: 10/14/22  4:57 AM  Result Value Ref Range   Sodium 136 135 - 145 mmol/L   Potassium 3.7 3.5 - 5.1 mmol/L   Chloride 104 98 - 111 mmol/L   CO2 24 22 - 32 mmol/L   Glucose, Bld 138 (H) 70 - 99 mg/dL    Comment: Glucose reference range applies only to samples taken after fasting for at least 8 hours.   BUN 15 8 - 23 mg/dL   Creatinine, Ser 9.62 0.44 - 1.00 mg/dL   Calcium 8.4 (L) 8.9 - 10.3 mg/dL   Total Protein 6.5 6.5 - 8.1 g/dL   Albumin 3.9 3.5 - 5.0 g/dL   AST 21 15 - 41 U/L   ALT 16 0 - 44 U/L   Alkaline Phosphatase 78 38 - 126 U/L   Total Bilirubin 0.5 0.3 - 1.2 mg/dL   GFR, Estimated >95 >28 mL/min    Comment: (NOTE) Calculated using the CKD-EPI Creatinine Equation (2021)    Anion gap 8 5 - 15    Comment: Performed at [facility], [address], [place], [place] [address]  CBC     Status: Abnormal   Collection Time: 10/14/22  4:57 AM  Result Value Ref Range   WBC 4.8 4.0 - 10.5 K/uL   RBC 3.75 (L) 3.87 - 5.11 MIL/uL   Hemoglobin 10.9 (L) 12.0 - 15.0 g/dL   HCT 40.1 (L) 02.7 - 25.3 %   MCV 93.9 80.0 - 100.0 fL   MCH 29.1 26.0 - 34.0 pg   MCHC 31.0 30.0 - 36.0 g/dL   RDW 66.4 40.3 - 47.4 %   Platelets 151 150 - 400 K/uL   nRBC 0.0 0.0 - 0.2 %    Comment: Performed at [facility], [address], [place], [place] [address]    DG Abd 1 View  Result Date: 10/14/2022 CLINICAL DATA:  NG tube placement. EXAM:  ABDOMEN - 1 VIEW COMPARISON:  09/21/2022. FINDINGS: There are mildly distended loops of small bowel in the abdomen measuring up to 3.3 cm. An enteric tube terminates in the stomach. Mild atelectasis is noted at the left lung base and there is a small left pleural effusion. No radio-opaque calculi. IMPRESSION: 1. Mildly distended loops of small bowel in the upper abdomen measuring up to 3.3 cm. 2. Enteric tube terminates in the stomach. Electronically Signed   By: Thornell Sartorius M.D.   On: 10/14/2022 02:07   DG Chest 1 View  Result Date: 10/14/2022 CLINICAL DATA:  NG tube placement. EXAM: CHEST  1 VIEW COMPARISON:  A 03/06/2021. FINDINGS: The heart is enlarged and mediastinal contours are within normal limits. There is atherosclerotic calcification of the aorta. Lung volumes are low. There is atelectasis at the left lung base. There is mild blunting of the left costophrenic angle, possible small pleural effusion. No obvious pneumothorax, however the lung apices are excluded from the field-of-view. On  image 2 the side port of the enteric tube is at the level of the diaphragm and should be advanced 6 cm. Distended loops of small bowel are noted in the upper abdomen measuring up to 3.2 cm. IMPRESSION: 1. Low lung volumes with mild atelectasis at the left lung base. 2. Blunting of the left costophrenic angle, possible small pleural effusion. 3. Dilated loops of small bowel in the abdomen, possible ileus versus obstruction. 4. The side port of the enteric tube is at the gastroesophageal junction and should be advanced 6 cm. Electronically Signed   By: Thornell Sartorius M.D.   On: 10/14/2022 00:26   CT ABDOMEN PELVIS W CONTRAST  Result Date: 10/13/2022 CLINICAL DATA:  Right-sided abdominal pain for 2 days, initial encounter EXAM: CT ABDOMEN AND PELVIS WITH CONTRAST TECHNIQUE: Multidetector CT imaging of the abdomen and pelvis was performed using the standard protocol following bolus administration of intravenous  contrast. RADIATION DOSE REDUCTION: This exam was performed according to the departmental dose-optimization program which includes automated exposure control, adjustment of the mA and/or kV according to patient size and/or use of iterative reconstruction technique. CONTRAST:  OMNIPAQUE IOHEXOL 300 MG/ML  SOLN COMPARISON:  06/04/2022 FINDINGS: Lower chest: No acute abnormality. Hepatobiliary: No focal liver abnormality is seen. No gallstones, gallbladder wall thickening, or biliary dilatation. Pancreas: Unremarkable. No pancreatic ductal dilatation or surrounding inflammatory changes. Spleen: Normal in size without focal abnormality. Adrenals/Urinary Tract: Adrenal glands are within normal limits. Kidneys demonstrate a normal enhancement pattern bilaterally. No renal calculi or obstructive changes are seen. The bladder is decompressed. Stomach/Bowel: Scattered diverticular change of the colon is noted without evidence of diverticulitis. There is a long segment of narrowing within the ascending colon best seen in the sagittal plane on image number 36 of series 6. No discrete mass is seen. These changes may be related to an internal hernia with dilatation of the cecum just proximal to this long segment of narrowing. Mild inflammatory changes are seen. Additionally there is narrowing of the terminal ileum associated with the area of narrowing in the ascending colon. Multiple fluid-filled dilated loops of small bowel are noted proximal to this in the ileum. The jejunum and stomach appear within normal limits. Vascular/Lymphatic: Aortic atherosclerosis. No enlarged abdominal or pelvic lymph nodes. Reproductive: Status post hysterectomy. No adnexal masses. Other: No abdominal wall hernia or abnormality. No abdominopelvic ascites. Musculoskeletal: No acute or significant osseous findings. IMPRESSION: Area of narrowing within the ascending colon distal to the cecum as well as an associated area of narrowing in the  terminal ileum with focal dilatation of the cecum and proximal ascending colon as well as the small bowel proximal to the terminal ileum. These changes are suspicious for internal hernia causing the long segment of narrowing and subsequent small-bowel obstruction. These changes are new from the prior CT examination. Diverticulosis without diverticulitis. Electronically Signed   By: Alcide Clever M.D.   On: 10/13/2022 22:31    Review of Systems  Constitutional:  Negative for chills and fever.  HENT:  Negative for ear discharge, hearing loss and sore throat.   Eyes:  Negative for discharge.  Respiratory:  Negative for cough and shortness of breath.   Cardiovascular:  Negative for chest pain and leg swelling.  Gastrointestinal:  Positive for abdominal pain. Negative for constipation, diarrhea, nausea and vomiting.  Musculoskeletal:  Negative for myalgias and neck pain.  Skin:  Negative for rash.  Allergic/Immunologic: Negative for environmental allergies.  Neurological:  Negative for dizziness and seizures.  Hematological:  Does not bruise/bleed easily.  Psychiatric/Behavioral:  Negative for suicidal ideas.   All other systems reviewed and are negative.  Blood pressure 120/86, pulse 91, temperature 97.8 F (36.6 C), temperature source Oral, resp. rate 15, height 5\' 6"  (1.676 m), weight 73.1 kg, SpO2 96%. Physical Exam Constitutional:      Appearance: Joan Robinson is well-developed.     Comments: Conversant No acute distress  HENT:     Head: Normocephalic and atraumatic.  Eyes:     General: Lids are normal. No scleral icterus.    Pupils: Pupils are equal, round, and reactive to light.     Comments: Pupils are equal round and reactive No lid lag Moist conjunctiva  Neck:     Thyroid: No thyromegaly.     Trachea: No tracheal tenderness.     Comments: No cervical lymphadenopathy Cardiovascular:     Rate and Rhythm: Normal rate and regular rhythm.     Heart sounds: No murmur heard. Pulmonary:      Effort: Pulmonary effort is normal.     Breath sounds: Normal breath sounds. No wheezing or rales.  Abdominal:     Tenderness: There is abdominal tenderness (min to RLQ).     Hernia: No hernia is present.  Musculoskeletal:     Cervical back: Normal range of motion and neck supple.  Skin:    General: Skin is warm.     Findings: No rash.     Nails: There is no clubbing.     Comments: Normal skin turgor  Neurological:     Mental Status: Joan Robinson is alert and oriented to person, place, and time.     Comments: Normal gait and station  Psychiatric:        Mood and Affect: Mood normal.        Thought Content: Thought content normal.        Judgment: Judgment normal.     Comments: Appropriate affect     Assessment/Plan: 63 year old female with abdominal pain, ?Internal hernia HTN HLD 1.  At this time would recommend continue NG tube, n.p.o. and observation.  Patient states that her pain is a lot better than when Joan Robinson first arrived to the ER.  If patient does not improve in the next 24 to 48 hours Joan Robinson may require diagnostic laparoscopy.  If things are better likely can DC NG tube and try p.o. trial. 2.  Will continue to follow along.  Axel Filler 10/14/2022, 8:04 AM

## 2022-10-14 NOTE — Assessment & Plan Note (Signed)
Stable. Can hold synthroid while she has NG tube. No need for IV synthroid at this time.

## 2022-10-14 NOTE — Progress Notes (Signed)
Telestroke Note   2028: NCCT imaging results provided to Dr. [place] Service at this time.    Derrill Kay Telestroke RN

## 2022-10-14 NOTE — Progress Notes (Signed)
Advanced NGT 6 cm, awaiting xray for placement verification

## 2022-10-14 NOTE — Progress Notes (Addendum)
Pt moved directly to room 1225 per MD's order. Pt's husband notified.

## 2022-10-14 NOTE — Progress Notes (Signed)
Noted pt with slurred speech and left side facial droop. Called rapid team response for reassessment and Rapid team called stroke team.

## 2022-10-14 NOTE — Significant Event (Signed)
Rapid Response Event Note   Reason for Call :  Stroke-like symptoms, Left sided weakness and facial drooping,   Initial Focused Assessment:  Upon assessment patient presented with left sided facial droop and slurred speech. Patient was able to tell me her name and birthday, unsure on month, knows what hospital she is currently at. NP Chinita Greenland at bedside. Orders for CT were established and code stroke initiated with neuro-tele cart at bedside. Last known well is "unknown", patient states she last felt somewhat normal between breakfast and lunch. Patient reported that her left side of her face felt weird, and she was experiencing auditory disturbances. Patient reported to rapid response RN that "she thought she had a stroke yesterday" but was unable to elaborate. Dayshift RN reported weakness at 1700 but no facial drooping. At shift change nightshift RN called rapid response for very pertinent facial droop and left sided weakness on arrival to patient room. Husband had left after lunch to go home. See flowsheets for NIH scale done at bedside. Patient taken to CT while tele-neuro specialist did another NIH scale and asked orientation questions. Rapid response RN called husband to update on patient status.   2110 CTA completed and patient transferred to 2W ICU awaiting bed at [facility].      Interventions:  CTA with perfusion Blood sugar- 179 at 1934  Plan of Care:  Transfer to ICU Transfer to [facility] for Neuro consult    Event Summary:   MD Notified: Chinita Greenland, NP Tele-Neuro: Braulio Bosch Call Time: 1931 Arrival Time: 1932 End Time: 2120  Odette Horns, RN

## 2022-10-14 NOTE — Progress Notes (Addendum)
Code Stroke /Neuro Consult    NAME:  Joan Robinson, MRN:  [id number],  DOB:  [birth year], LOS: 0 ADMISSION DATE:  10/13/2022,  CONSULTATION DATE:  10/14/22 REFERRING: Chinita Greenland ACNPC  CHIEF COMPLAINT:  Code Stroke w deficits   Brief History   62 year old female history of hiatal hernia, history of esophageal stricture, GERD, peptic ulcer disease, diverticulosis, hypertension.  Admitted through ER on 10/13/2022 with abdominal pain/nausea and suspicion for small bowel obstruction.  Was reported that patient's daytime nurse noted left eye droop around 5 PM tonight and later in the evening there was possible notice of left facial droop.  Patient stated she felt something was not exactly right yesterday but did not mention it before now. Unknown last known well time.  On arrival to bedside patient was lethargic, dysarthric speech able to answer some questions but there is obvious disorientation and confusion. Please see Dr. [place] Service assessment for Tele-neuro specifics.  Consulted our inpatient neurology Dr.Khaliqdina, Neurology recommended to transfer patient to [facility] ICU care.   Neurology advises they will be primary care team for the patient.   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 The involvement of the brainstem appears quite extensive. Cerebral hemispheres appear normal without evidence of old or acute stroke. No mass, hemorrhage, hydrocephalus or extra-axial collection.   Vascular: No abnormal vascular finding.   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 The involvement of the brainstem appears quite extensive. No evidence of hemorrhage. No hydrocephalus.   Call report in progress.     Electronically Signed   By: Paulina Fusi M.D.   On: 10/14/2022 20:13    Repeat CT and MRI scheduled as directed for 10/15/22  Antimicrobials:  *   Interim history/subjective:  Facial droop Slurred Speech and reduced function in LEFT upper extremity on initial contact.  Surgery was consulted on this patient originally with "no plans for operative intervention at this time"  Currently has NG tube IV fluid management   Objective   Blood pressure Marland Kitchen)  152/90, pulse 80, temperature 97.9 F (36.6 C), resp. rate 19, height 5\' 6"  (1.676 m), weight 73.1 kg, SpO2 90%.        Intake/Output Summary (Last 24 hours) at 10/14/2022 2147 Last data filed at 10/14/2022 2126 Gross per 24 hour  Intake 896.39 ml  Output 150 ml  Net 746.39 ml   Filed Weights   10/13/22 2110 10/14/22 0356  Weight: 72.6 kg 73.1 kg    Examination: General: alert/oriented x3  HENT: dry MM Lungs: CTAB, normal work of breathing Cardiovascular: RRR, no murmur, no JVD  Abdomen: soft, nontender, normal bowel sounds Extremities: warm, well-perfused without cyanosis, edema Neuro: follows commands,  Assessment & Plan:  Transfer to [employer] /Neuro   Best practice:  Diet: NPO Pain/Anxiety/Delirium protocol DVT prophylaxis:  GI prophylaxis: Plavix Code  Status: FULL Code Family Communication: At Bedside  Disposition: Currently [facility] ICU pending transfer to [facility] in Progress  Labs   CBC: Recent Labs  Lab 10/13/22 2116 10/14/22 0457  WBC 5.2 4.8  HGB 11.9* 10.9*  HCT 36.0 35.2*  MCV 90.2 93.9  PLT 179 151    Basic Metabolic Panel: Recent Labs  Lab 10/13/22 2116 10/14/22 0457  NA 139 136  K 3.8 3.7  CL 104 104  CO2 27 24  GLUCOSE 142* 138*  BUN 16 15  CREATININE 0.97 0.73  CALCIUM 9.3 8.4*   GFR: Estimated Creatinine Clearance: 75.5 mL/min (by C-G formula based on SCr of 0.73 mg/dL). Recent Labs  Lab 10/13/22 2116 10/14/22 0457  WBC 5.2 4.8    Liver Function Tests: Recent Labs  Lab 10/13/22 2116 10/14/22 0457  AST 20 21  ALT 16 16  ALKPHOS 78 78  BILITOT 0.5 0.5  PROT 7.0 6.5  ALBUMIN 4.6 3.9   Recent Labs  Lab 10/13/22 2116  LIPASE 10*   No results for input(s): "AMMONIA" in the last 168 hours.  ABG    Component Value Date/Time   HCO3 24.9 (H) 02/26/2007 2027   TCO2 26 02/26/2007 2027   ACIDBASEDEF 1.0 02/26/2007 2027     Coagulation Profile: No results for input(s): "INR", "PROTIME" in the last 168 hours.  Cardiac Enzymes: No results for input(s): "CKTOTAL", "CKMB", "CKMBINDEX", "TROPONINI" in the last 168 hours.  HbA1C: Hgb A1c MFr Bld  Date/Time Value Ref Range Status  09/02/2015 01:52 PM 5.4 <5.7 % Final    Comment:      For the purpose of screening for the presence of diabetes:   <5.7%       Consistent with the absence of diabetes 5.7-6.4 %   Consistent with increased risk for diabetes (prediabetes) >=6.5 %     Consistent with diabetes   This assay result is consistent with a decreased risk of diabetes.   Currently, no consensus exists regarding use of hemoglobin A1c for diagnosis of diabetes in children.   According to American Diabetes Association (ADA) guidelines, hemoglobin A1c <7.0% represents optimal control in non-pregnant diabetic patients. Different metrics may apply  to specific patient populations. Standards of Medical Care in Diabetes (ADA).       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   Surgical History    Past Surgical History:  Procedure Laterality Date   ABDOMINAL HYSTERECTOMY     ABDOMINAL HYSTERECTOMY  01/15/2009   complete   COLONOSCOPY     DILATION AND CURETTAGE OF UTERUS  01/16/1983   EYE SURGERY  03/03/2014   Surgery on both eyes for epiretinal membrane (vitreous peel)   Gated Spect wall motion stress cardiolite  11/05/2001   HIATAL HERNIA REPAIR N/A 07/12/2021   Procedure: LAPAROSCOPY W/ EXTENSIVE FOREGUT DISSECTION; PARTIAL STOMACH REDUCTION; GASTROSTOMY TUBE PLACEMENT; GASTROPEXY;  Surgeon: Luretha Murphy, MD;  Location: [facility];  Service: General;  Laterality: N/A;   POLYPECTOMY     TUBAL LIGATION  01/15/1993   UTERINE SUSPENSION     mesh   VITRECTOMY Bilateral 03/03/2014   XI ROBOTIC ASSISTED HIATAL HERNIA REPAIR N/A 05/01/2021   Procedure: XI ROBOTIC ASSISTED TYPE III HIATAL HERNIA REPAIR WITH FUNDOPLICATION;  Surgeon: Luretha Murphy, MD;  Location: [facility];  Service: General;  Laterality: N/A;     Social History   reports that she quit smoking about 13 years ago. Her smoking use included cigarettes. She started smoking about  43 years ago. She has a 30 pack-year smoking history. She has never used smokeless tobacco. She reports that she does not drink alcohol and does not use drugs.   Family History   Her family history includes Aneurysm in her father; Arthritis in her sister; Colon cancer in her paternal uncle; Colon polyps in her father; Heart disease (age of onset: 25) in her mother; Hyperlipidemia in her mother; Hypertension in her mother and sister; Irritable bowel syndrome in her daughter; Mental illness in her daughter and son; Other in her daughter, father, mother, sister, and sister; Stroke in her mother.   Allergies Allergies  Allergen Reactions   Aspirin Shortness Of Breath   Nsaids Shortness Of Breath   Keflex [Cephalexin] Diarrhea   Butamben-Tetracaine-Benzocaine     Mouth sores   Dicyclomine Hcl     REACTION: mouth ulcers   Metronidazole Hives    mouth ulcers   Phenergan [Promethazine Hcl] Other (See Comments)    "knocks" pt out for about 3 days   Quetiapine     Somnolence. Slept for 36 hours straight.   Benay Spice [Lifitegrast]     Burned eyes   Cymbalta [Duloxetine Hcl] Rash   Levothyroxine Palpitations    Pt must use brand SYNTHROID   Moxifloxacin Nausea Only and Palpitations    REACTION: increased heart rate   Moxifloxacin Hcl In Nacl Palpitations   Orphenadrine Citrate Palpitations   Sulfamethoxazole-Trimethoprim Nausea Only and Palpitations    REACTION: increased heart rate, nausea     Home Medications  Prior to Admission medications   Medication Sig Start Date End Date Taking? Authorizing Provider  SYNTHROID 88 MCG tablet Take 88 mcg by mouth daily before breakfast. 12/09/12  Yes [provider]  calcium carbonate (TUMS) 500 MG chewable tablet Chew 1-2 tablets by mouth as needed.    [provider]  cetirizine (ZYRTEC) 10 MG tablet Take 10 mg by mouth as needed. 06/14/20   [provider]  Cholecalciferol (D 1000) 25 MCG (1000 UT) capsule Take 1,000 Units  by mouth daily. 05/09/16   [provider]  escitalopram (LEXAPRO) 10 MG tablet Take 10 mg by mouth daily. 09/24/22   [provider]  estradiol (  ESTRACE) 0.1 MG/GM vaginal cream Place 1 Applicatorful vaginally 2 (two) times a week. 08/02/22   [provider]  fexofenadine (ALLEGRA) 180 MG tablet Take 180 mg by mouth as needed. 10/27/12   [provider]  HYDROcodone-acetaminophen (NORCO) 10-325 MG tablet Take 1 tablet by mouth every 8 (eight) hours as needed. 08/20/22 08/20/23  [provider]  latanoprost (XALATAN) 0.005 % ophthalmic solution Place 1 drop into both eyes at bedtime. 02/06/16   [provider]  lisdexamfetamine (VYVANSE) 70 MG capsule Take 70 mg by mouth daily. 09/20/20   [provider]  nitroGLYCERIN (NITROSTAT) 0.4 MG SL tablet Place 0.4 mg under the tongue every 5 (five) minutes as needed. 03/01/15   [provider]  ondansetron (ZOFRAN) 4 MG tablet Take 1 tablet (4 mg total) by mouth every 8 (eight) hours as needed for nausea or vomiting. Take 1-2 tablets every 4-6 hours as needed for nausea 05/03/21   Luretha Murphy, MD  pantoprazole (PROTONIX) 40 MG tablet Take 1 tablet (40 mg total) by mouth daily. 09/04/22   Hilarie Fredrickson, MD  raNITIdine HCl (RANITIDINE 75 PO) Take 1 tablet by mouth as needed (Hives). 07/23/14   [provider]  rosuvastatin (CRESTOR) 20 MG tablet Take 1 tablet (20 mg total) by mouth daily. 09/21/22 12/20/22  Rollene Rotunda, MD      Update 0009 hrs. Care-Link departure with patient 2358 hrs   Chinita Greenland MSNA MSN ACNPC-AG Acute Care Nurse Practitioner [facility]

## 2022-10-15 ENCOUNTER — Inpatient Hospital Stay (HOSPITAL_COMMUNITY): Payer: BC Managed Care – PPO

## 2022-10-15 DIAGNOSIS — I63542 Cerebral infarction due to unspecified occlusion or stenosis of left cerebellar artery: Secondary | ICD-10-CM | POA: Diagnosis not present

## 2022-10-15 DIAGNOSIS — I63442 Cerebral infarction due to embolism of left cerebellar artery: Secondary | ICD-10-CM

## 2022-10-15 DIAGNOSIS — K56609 Unspecified intestinal obstruction, unspecified as to partial versus complete obstruction: Secondary | ICD-10-CM | POA: Diagnosis not present

## 2022-10-15 LAB — COMPREHENSIVE METABOLIC PANEL
ALT: 17 U/L (ref 0–44)
AST: 21 U/L (ref 15–41)
Albumin: 3.6 g/dL (ref 3.5–5.0)
Alkaline Phosphatase: 80 U/L (ref 38–126)
Anion gap: 11 (ref 5–15)
BUN: 11 mg/dL (ref 8–23)
CO2: 22 mmol/L (ref 22–32)
Calcium: 8.7 mg/dL — ABNORMAL LOW (ref 8.9–10.3)
Chloride: 99 mmol/L (ref 98–111)
Creatinine, Ser: 0.67 mg/dL (ref 0.44–1.00)
GFR, Estimated: >60 mL/min (ref >60)
Glucose, Bld: 122 mg/dL — ABNORMAL HIGH (ref 70–99)
Potassium: 3.7 mmol/L (ref 3.5–5.1)
Sodium: 132 mmol/L — ABNORMAL LOW (ref 135–145)
Total Bilirubin: 0.6 mg/dL (ref 0.3–1.2)
Total Protein: 6.4 g/dL — ABNORMAL LOW (ref 6.5–8.1)

## 2022-10-15 LAB — GLUCOSE, CAPILLARY
Glucose-Capillary: 100 mg/dL — ABNORMAL HIGH (ref 70–99)
Glucose-Capillary: 107 mg/dL — ABNORMAL HIGH (ref 70–99)
Glucose-Capillary: 111 mg/dL — ABNORMAL HIGH (ref 70–99)
Glucose-Capillary: 113 mg/dL — ABNORMAL HIGH (ref 70–99)
Glucose-Capillary: 114 mg/dL — ABNORMAL HIGH (ref 70–99)
Glucose-Capillary: 127 mg/dL — ABNORMAL HIGH (ref 70–99)
Glucose-Capillary: 135 mg/dL — ABNORMAL HIGH (ref 70–99)

## 2022-10-15 LAB — SODIUM
Sodium: 135 mmol/L (ref 135–145)
Sodium: 137 mmol/L (ref 135–145)
Sodium: 142 mmol/L (ref 135–145)
Sodium: 145 mmol/L (ref 135–145)

## 2022-10-15 LAB — LIPID PANEL
Cholesterol: 142 mg/dL (ref 0–200)
HDL: 76 mg/dL (ref >40)
LDL Cholesterol: 54 mg/dL (ref 0–99)
Total CHOL/HDL Ratio: 1.9 {ratio}
Triglycerides: 60 mg/dL (ref <150)
VLDL: 12 mg/dL (ref 0–40)

## 2022-10-15 LAB — ECHOCARDIOGRAM COMPLETE
AR max vel: 2.69 cm2
AV Area VTI: 2.27 cm2
AV Area mean vel: 2.59 cm2
AV Mean grad: 4 mm[Hg]
AV Peak grad: 6.3 mm[Hg]
Ao pk vel: 1.25 m/s
Area-P 1/2: 2.67 cm2
Height: 66 in
S' Lateral: 1.7 cm
Weight: 2578.5 [oz_av]

## 2022-10-15 LAB — CBC
HCT: 40.1 % (ref 36.0–46.0)
Hemoglobin: 13.3 g/dL (ref 12.0–15.0)
MCH: 30 pg (ref 26.0–34.0)
MCHC: 33.2 g/dL (ref 30.0–36.0)
MCV: 90.3 fL (ref 80.0–100.0)
Platelets: 154 10*3/uL (ref 150–400)
RBC: 4.44 MIL/uL (ref 3.87–5.11)
RDW: 12.3 % (ref 11.5–15.5)
WBC: 7.7 10*3/uL (ref 4.0–10.5)
nRBC: 0 % (ref 0.0–0.2)

## 2022-10-15 LAB — HEMOGLOBIN A1C
Hgb A1c MFr Bld: 5.6 % (ref 4.8–5.6)
Mean Plasma Glucose: 114.02 mg/dL

## 2022-10-15 LAB — MRSA NEXT GEN BY PCR, NASAL: MRSA by PCR Next Gen: NOT DETECTED

## 2022-10-15 MED ORDER — SODIUM CHLORIDE 3 % IV SOLN
INTRAVENOUS | Status: DC
  Filled 2022-10-15 (×9): qty 500

## 2022-10-15 MED ORDER — HEPARIN SODIUM (PORCINE) 5000 UNIT/ML IJ SOLN
5000.0000 [IU] | Freq: Three times a day (TID) | INTRAMUSCULAR | Status: DC
  Administered 2022-10-15 – 2022-10-25 (×30): 5000 [IU] via SUBCUTANEOUS
  Filled 2022-10-15 (×31): qty 1

## 2022-10-15 MED ORDER — ORAL CARE MOUTH RINSE: 15.0000 mL | OROMUCOSAL | Status: DC | PRN

## 2022-10-15 MED ORDER — STROKE: EARLY STAGES OF RECOVERY BOOK
Freq: Once | Status: AC
  Filled 2022-10-15: qty 1

## 2022-10-15 MED ORDER — ROSUVASTATIN CALCIUM 20 MG PO TABS: 20.0000 mg | ORAL_TABLET | Freq: Every day | ORAL | Status: DC

## 2022-10-15 MED ORDER — ORAL CARE MOUTH RINSE
15.0000 mL | OROMUCOSAL | Status: DC
  Administered 2022-10-15 – 2022-10-25 (×40): 15 mL via OROMUCOSAL

## 2022-10-15 MED ORDER — CARMEX CLASSIC LIP BALM EX OINT
TOPICAL_OINTMENT | CUTANEOUS | Status: DC | PRN
  Filled 2022-10-15 (×2): qty 10

## 2022-10-15 NOTE — Progress Notes (Signed)
Husband called and updated, wife has been picked up by [facility] and is on the way to [facility].   Care link left the building at 9/29 2358.

## 2022-10-15 NOTE — Progress Notes (Signed)
SLP Cancellation Note  Patient Details Name: Joan Robinson MRN: [id number] DOB: [birth year]   Cancelled treatment:         Attempted to see pt for swallowing assessment.  Pt with new CVA.  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 The involvement of the brainstem appears quite extensive." Pt also with suspicion for SBO and is NPO at present.  Rad completing Ab series imaging to determine course of action.  SLP will hold evaluation pending results.    Kerrie Pleasure, MA, CCC-SLP Acute Rehabilitation Services Office: [phone] 10/15/2022, 10:54 AM

## 2022-10-15 NOTE — Progress Notes (Signed)
Inpatient Rehab Admissions Coordinator:  ? ?Per therapy recommendations,  patient was screened for CIR candidacy by Devaney Segers, MS, CCC-SLP. At this time, Pt. Appears to be a a potential candidate for CIR. I will place   order for rehab consult per protocol for full assessment. Please contact me any with questions. ? ?Trine Fread, MS, CCC-SLP ?Rehab Admissions Coordinator  ?[phone] (celll) ?[phone] (office) ? ?

## 2022-10-15 NOTE — Progress Notes (Addendum)
STROKE TEAM PROGRESS NOTE   INTERVAL HISTORY Her family is at the bedside.  Patient in bed lethargic. Not difficult to arouse, but drowsy.   Vitals:   10/15/22 0600 10/15/22 0700 10/15/22 0800 10/15/22 1100  BP: (!) 145/92 (!) 144/92 133/84 (!) 137/91  Pulse: 81 98 (!) 102   Resp: 20 (!) 26 20   Temp:      TempSrc:      SpO2: 93% 91% 94%   Weight:      Height:       CBC:  Recent Labs  Lab 10/14/22 0457 10/15/22 0228  WBC 4.8 7.7  HGB 10.9* 13.3  HCT 35.2* 40.1  MCV 93.9 90.3  PLT 151 154   Basic Metabolic Panel:  Recent Labs  Lab 10/14/22 0457 10/15/22 0228 10/15/22 0726 10/15/22 1032  NA 136 132* 135 137  K 3.7 3.7  --   --   CL 104 99  --   --   CO2 24 22  --   --   GLUCOSE 138* 122*  --   --   BUN 15 11  --   --   CREATININE 0.73 0.67  --   --   CALCIUM 8.4* 8.7*  --   --    Lipid Panel:  Recent Labs  Lab 10/15/22 0228  CHOL 142  TRIG 60  HDL 76  CHOLHDL 1.9  VLDL 12  LDLCALC 54   HgbA1c:  Recent Labs  Lab 10/15/22 0228  HGBA1C 5.6    IMAGING past 24 hours CT HEAD WO CONTRAST ( )  Result Date: 10/15/2022 CLINICAL DATA:  62 year old female with left PICA territory infarct on head CT yesterday. No large vessel occlusion on CTA. EXAM: CT HEAD WITHOUT CONTRAST TECHNIQUE: Contiguous axial images were obtained from the base of the skull through the vertex without intravenous contrast. RADIATION DOSE REDUCTION: This exam was performed according to the departmental dose-optimization program which includes automated exposure control, adjustment of the mA and/or kV according to patient size and/or use of iterative reconstruction technique. COMPARISON:  Head CT 10/14/2022. FINDINGS: Brain: Confluent cytotoxic edema in the left inferior and lateral cerebellum, stable from 2000 hours yesterday. No hemorrhagic transformation. Some lower cerebellar vermis involvement. No obvious brainstem involvement by CT. No extension since yesterday. No significant posterior  fossa mass effect. Basilar cisterns and 4th ventricle remain patent. Supratentorial brain maintained normal for age gray-white differentiation. No midline shift, ventriculomegaly, mass effect, evidence of mass lesion, intracranial hemorrhage. Or evidence of cortically based acute infarction. Gray-white matter differentiation is within normal limits throughout the brain. Vascular: No suspicious intracranial vascular hyperdensity. Skull: Intact, negative. Sinuses/Orbits: Right nasoenteric tube is in place. Visualized paranasal sinuses and mastoids are stable and well aerated. Other: Stable orbit and scalp soft tissues. IMPRESSION: 1. Stable sizable Left PICA territory infarct since yesterday. Confluent cytotoxic edema but no hemorrhagic transformation, no interval extension, and no significant posterior fossa mass effect. 2. No new intracranial abnormality. Electronically Signed   By: Odessa Fleming M.D.   On: 10/15/2022 04:46   CT ANGIO HEAD NECK W WO CM W PERF (CODE STROKE)  Result Date: 10/14/2022 CLINICAL DATA:  Left PICA distribution stroke by CT EXAM: CT ANGIOGRAPHY HEAD AND NECK CT PERFUSION BRAIN TECHNIQUE: Multidetector CT imaging of the head and neck was performed using the standard protocol during bolus administration of intravenous contrast. Multiplanar CT image reconstructions and MIPs were obtained to evaluate the vascular anatomy. Carotid stenosis measurements (when applicable) are obtained utilizing NASCET  criteria, using the distal internal carotid diameter as the denominator. Multiphase CT imaging of the brain was performed following IV bolus contrast injection. Subsequent parametric perfusion maps were calculated using RAPID software. RADIATION DOSE REDUCTION: This exam was performed according to the departmental dose-optimization program which includes automated exposure control, adjustment of the mA and/or kV according to patient size and/or use of iterative reconstruction technique. CONTRAST:   OMNIPAQUE IOHEXOL 350 MG/ML SOLN COMPARISON:  Head CT immediately prior FINDINGS: CTA NECK FINDINGS Aortic arch: Aortic arch appears normal. Branching pattern is normal. Minimal atherosclerosis at the proximal left subclavian artery without stenosis. Right carotid system: Common carotid artery widely patent to the bifurcation. Bifurcation and ICA bulb are normal. Cervical ICA is normal. Left carotid system: Left carotid system similarly normal. Vertebral arteries: Both vertebral artery origins are widely patent. Both vertebral arteries are patent through the cervical region to the foramen magnum. Skeleton: Ordinary cervical spondylosis. Other neck: No mass or lymphadenopathy. Upper chest: Mild emphysema at the lung apices. Mild dependent pulmonary atelectasis. Review of the MIP images confirms the above findings CTA HEAD FINDINGS Anterior circulation: Both internal carotid arteries are patent through the skull base and siphon regions. No siphon stenosis. The anterior and middle cerebral vessels are patent. No large vessel occlusion or proximal stenosis. No aneurysm or vascular malformation. Posterior circulation: Both vertebral arteries are patent through the foramen magnum to the basilar artery. There does appear to be flow in both posteroinferior cerebellar arteries presently. Right anterior inferior cerebellar artery is noted. No definite left anterior inferior cerebellar artery. Superior cerebellar and posterior cerebral arteries show flow. Venous sinuses: Patent and normal. Anatomic variants: None significant. Review of the MIP images confirms the above findings CT Brain Perfusion Findings: ASPECTS: 10 CBF (<30%) Volume: 0mL Perfusion (Tmax>6.0s) volume: 0mL Mismatch Volume: 0mL Infarction Location:The examination is tailored to the anterior circulation. No infarction in the anterior circulation. Portion of the cerebellum involved by the infarction is not included in the region of the exam. IMPRESSION: 1. No  carotid bifurcation disease. No anterior circulation pathology seen. 2. No posterior circulation large vessel occlusion or proximal stenosis. Flow appears to be present in both posteroinferior cerebellar arteries presently. Right anterior inferior cerebellar artery is present. Left anterior inferior cerebellar artery is not seen. This could be due to occlusion, hypoplasia or aplasia. 3. No evidence of infarction in the anterior circulation territory. The portion of the cerebellum involved by the infarction is not included in the region of the perfusion exam. 4. Emphysema at the lung apices. Mild dependent pulmonary atelectasis. Emphysema (ICD10-J43.9). Electronically Signed   By: Paulina Fusi M.D.   On: 10/14/2022 20:58   CT HEAD WO CONTRAST ( )  Result Date: 10/14/2022 CLINICAL DATA:  Stroke, hemorrhage, coach stroke. EXAM: CT HEAD WITHOUT CONTRAST TECHNIQUE: Contiguous axial images were obtained from the base of the skull through the vertex without intravenous contrast. RADIATION DOSE REDUCTION: This exam was performed according to the departmental dose-optimization program which includes automated exposure control, adjustment of the mA and/or kV according to patient size and/or use of iterative reconstruction technique. COMPARISON:  None Available. 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 The involvement of the brainstem appears quite extensive. Cerebral hemispheres appear normal without evidence of old or acute stroke. No mass, hemorrhage, hydrocephalus or extra-axial collection. Vascular: No abnormal vascular finding. Skull: Normal Sinuses/Orbits: Clear/normal Other: None IMPRESSION: Low-density within the medulla and the inferior  left cerebellum consistent with acute left posteroinferior cerebellar artery territory infarction. The involvement of the brainstem appears quite extensive.  No evidence of hemorrhage. No hydrocephalus. Call report in progress. Electronically Signed   By: Paulina Fusi M.D.   On: 10/14/2022 20:13    PHYSICAL EXAM Physical Exam  Constitutional: Appears well-developed and well-nourished.  Psych: Affect appropriate to situation Eyes: Normal external eye and conjunctiva. HENT: Normocephalic, no lesions, without obvious abnormality.   Musculoskeletal-no joint tenderness, deformity or swelling Cardiovascular: Normal rate and regular rhythm.  Respiratory: Effort normal, non-labored breathing saturations WNL GI: Soft.  No distension. There is no tenderness.  Skin: WDI   Neuro:  Mental Status: Alert, oriented,   Speech fluent without evidence of aphasia.  Able to follow some simple commands without difficulty. Cranial Nerves: II: PERRL; unable to test visual fields d/t drowsiness III,IV, VI: ptosis not present, extra-ocular motions intact bilaterally pupils equal, round, reactive to light and accommodation V,VII: smile asymmetric left facial droop, facial light touch sensation normal bilaterally VIII: hearing normal bilaterally IX,X: uvula rises symmetrically XI: bilateral shoulder shrug XII: midline tongue extension Motor: Moving all 4 extremities anti-gravity spontaneously  Tone and bulk:normal tone throughout; no atrophy noted Sensory: light touch intact throughout, bilaterally Cerebellar: uta Gait: deferred     ASSESSMENT/PLAN Joan Robinson is a 62 y.o. female with past medical history of esophageal strictures status post dilation, multiple hernias status post multiple surgeries, GERD/PUD, hypertension, hyperlipidemia, autoimmune urticaria, emphysema, sleep apnea who presents to the ED with right upper quadrant and right lower quadrant abdominal pain x 2 days associated nausea.  She was found to have small bowel obstruction and was being managed inpatient at [facility].  Around shift change, patient was noted to have left facial  droop, slurring of her speech and upon further questioning, symptoms were first noted at 1700 on 10/14/2022.  Her team had difficulty establishing a last known well.  However, symptoms were first noted at 1700.  A code stroke was activated if she had a CT head without contrast which was notable for a moderately large left PICA territory infarct.  She was eval by teleneurology.  She was not given TNKase due to unclear last known well and the fact that the stroke looks completed.  CT angio head and neck with and without contrast was negative for any large vessel occlusion.  Specifically, no basilar occlusion or thrombosis noted.  CT perfusion with no core or mismatch noted.   Given the moderately large posterior fossa stroke which is in proximity to brainstem, and the extension of the stroke into the brainstem, she was transferred to [facility] medical ICU for immediate in person neurology evaluation and for close monitoring for signs of posterior fossa crowding and potential herniation.  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 Confluent cytotoxic edema but no hemorrhagic transformation, no interval extension, and no significant posterior fossa mass effect. CTA head & neck left AICA not visualized Ct perfusion: no core MRI Large acute infarct (affecting both the left PICA and left AICA vascular territories). . Posterior fossa mass effect with effacement of the fourth ventricle inferiorly. No evidence of obstructive hydrocephalus at this time.  2D Echo EF 60-65% LDL 54 HgbA1c 5.6 VTE prophylaxis - heparin subcutaneous  No antithrombotic prior to admission, now on no antithrombotics. Pt allergic to ASA and avoid plavix now given potential surgical intervention. Therapy  recommendations:  pending Disposition:  pending  Cerebellar edema Mild mass effect to compress on the  brainstem Risk of developing hydrocephalus On 3% saline Need close clinical and imaging monitoring Stat CT if neuro changes CT repeat tomorrow or wednesday.   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 CT abd/pelvis pending  Hypertension Home meds:  none Stable Long-term BP goal normotensive  Hyperlipidemia Home meds:  crestor 20mg   LDL 54, goal < 70 Resume home crestor 20 once po access Continue statin at discharge  Other Stroke Risk Factors Sleep apnea, not on CPAP at home  Other Active Problems Esophageal stricture s/p dilation  PUD multiple hernias status post multiple surgeries  GERD Autoimmune urticaria  Emphysema   Hospital day # 1  Valentina Lucks, MSN, NP-C [facility] See AMION or use Epic Chat   ATTENDING NOTE: I reviewed above note and agree with the assessment and plan. Pt was seen and examined.   Sister and daughter are at the bedside. Pt awake, alert, lethargic, eyes open, orientated to age, place, time and people. No aphasia, but paucity of speech, answer questions with short words, following all simple commands. Able to name and repeat in mildly dysarthric voice. No gaze palsy but eyes disconjugate with left eye mildly upward position, eye movement intact bilaterally, visual field full. Left peripheral facial droop with decreased eye blinking on the left. Tongue midline. Muscle strength bilaterally symmetrical except LUE finger grip 4+/5. Significant left FTN and HTS ataxia. Sensation symmetrical bilaterally subjectively, gait not tested.   Pt currently has large left cerebellar infarct with risk of hydrocephalus  and brainstem compression. On 3% saline. Stat CT if neuro changes. Not on ASA due to allergy, not on plavix given NPO and potential surgery for SBO. Surgery on board. Pending abd/pelvis CT. Hold off statin too given no po access. Continue supportive care.   For detailed assessment and plan, please refer to above/below as I have made changes wherever appropriate.   Marvel Plan, MD PhD Stroke Neurology 10/15/2022 4:38 PM  This patient is critically ill due to large cerebellar infarct, cerebellar edema, SBO and at significant risk of neurological worsening, death form hydrocephalus, brain herniation, acute abdominal syndrome. This patient's care requires constant monitoring of vital signs, hemodynamics, respiratory and cardiac monitoring, review of multiple databases, neurological assessment, discussion with family, other specialists and medical decision making of high complexity. I spent 30 minutes of neurocritical care time in the care of this patient. I had long discussion with sister and daughter at bedside, updated pt current condition, treatment plan and potential prognosis, and answered all the questions. They expressed understanding and appreciation.      To contact Stroke Continuity provider, please refer to [email or web address]. After hours, contact General Neurology

## 2022-10-15 NOTE — H&P (Signed)
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 female with past medical history of esophageal strictures status post dilation, multiple hernias status post multiple surgeries, GERD/PUD, hypertension, hyperlipidemia, autoimmune urticaria, emphysema, sleep apnea who presents to the ED with right upper quadrant and right lower quadrant abdominal pain x 2 days associated nausea.  She was found to have small bowel obstruction and was being managed inpatient at [facility].  Around shift change, patient was noted to have left facial droop, slurring of her speech and upon further questioning, symptoms were first noted at 1700 on 10/14/2022.  Her team had difficulty establishing a last known well.  However, symptoms were first noted at 1700.  A code stroke was activated if she had a CT head without contrast which was notable for a moderately large left PICA territory infarct.  She was eval by teleneurology.  She was not given TNKase due to unclear last known well and the fact that the stroke looks completed.  CT angio head and neck with and without contrast was negative for any large vessel occlusion.  Specifically, no basilar occlusion or thrombosis noted.  CT perfusion with no core or mismatch noted.  Given the moderately large posterior fossa stroke which is in proximity to brainstem, and the extension of the stroke into the brainstem, she was transferred to [facility] medical ICU for immediate in person neurology evaluation and for close monitoring for signs of posterior fossa crowding and potential herniation.  Last known well: unclear, symptoms noted at 1700 Modified rankin score: 1-No significant post stroke disability and can perform usual duties with stroke symptoms tNKASE: Not offered due to no last known  well. Thrombectomy: Not offered due to no LVO. NIHSS components Score: Comment  1a Level of Conscious 0[]  1[x]  2[]  3[]      1b LOC Questions 0[]  1[]  2[x]       1c LOC Commands 0[]  1[]  2[x]       2 Best Gaze 0[]  1[x]  2[]     Left INO  3 Visual 0[x]  1[]  2[]  3[]      4 Facial Palsy 0[]  1[]  2[x]  3[]      5a Motor Arm - left 0[x]  1[]  2[]  3[]  4[]  UN[]    5b Motor Arm - Right 0[x]  1[]  2[]  3[]  4[]  UN[]    6a Motor Leg - Left 0[x]  1[]  2[]  3[]  4[]  UN[]    6b Motor Leg - Right 0[x]  1[]  2[]  3[]  4[]  UN[]    7 Limb Ataxia 0[]  1[]  2[x]  3[]  UN[]     8 Sensory 0[]  1[x]  2[]  UN[]      9 Best Language 0[x]  1[]  2[]  3[]      10 Dysarthria 0[]  1[]  2[x]  UN[]      11 Extinct. and Inattention 0[x]  1[]  2[]       TOTAL: 13      ROS   Unable to obtain review of system secondary to drowsiness and slurred speech  Past History   Past Medical History:  Diagnosis Date   Allergic rhinitis    Anemia 10/10/2011   Anxiety and depression 01/17/2007   Qualifier: Diagnosis of  By: Everardo All MD, Sean A    Arthritis 07/24/2013   Likely inflammatory and following with Dr Maryln [facility]tron [facility]  rheumatology   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   BCC (basal cell carcinoma of skin) 06/01/2012  Leg Follows with Dr Margo Aye   Bipolar disorder [facility]) 01/17/2007   Qualifier: Diagnosis of  By: Everardo All MD, Gregary Signs A    Cataract    Diverticulosis    Diverticulosis    Emphysema of lung (HCC)    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   Gallstones    GERD (gastroesophageal reflux disease)    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   Hashimoto's disease    Hyperlipidemia    Hypertension    Hypothyroidism 08/24/2006   Qualifier: Diagnosis of  By: Everardo All MD, Sean A     IBS (irritable bowel syndrome) 07/27/2016   Obesity 11/01/2013   PUD (peptic ulcer disease)    Sleep apnea 04/27/2016   Tobacco abuse    Past Surgical History:  Procedure Laterality Date   ABDOMINAL HYSTERECTOMY     ABDOMINAL HYSTERECTOMY  01/15/2009   complete   COLONOSCOPY     DILATION  AND CURETTAGE OF UTERUS  01/16/1983   EYE SURGERY  03/03/2014   Surgery on both eyes for epiretinal membrane (vitreous peel)   Gated Spect wall motion stress cardiolite  11/05/2001   HIATAL HERNIA REPAIR N/A 07/12/2021   Procedure: LAPAROSCOPY W/ EXTENSIVE FOREGUT DISSECTION; PARTIAL STOMACH REDUCTION; GASTROSTOMY TUBE PLACEMENT; GASTROPEXY;  Surgeon: Luretha Murphy, MD;  Location: [facility];  Service: General;  Laterality: N/A;   POLYPECTOMY     TUBAL LIGATION  01/15/1993   UTERINE SUSPENSION     mesh   VITRECTOMY Bilateral 03/03/2014   XI ROBOTIC ASSISTED HIATAL HERNIA REPAIR N/A 05/01/2021   Procedure: XI ROBOTIC ASSISTED TYPE III HIATAL HERNIA REPAIR WITH FUNDOPLICATION;  Surgeon: Luretha Murphy, MD;  Location: [facility];  Service: General;  Laterality: N/A;   Family History  Problem Relation Age of Onset   Heart disease Mother 18       stents   Stroke Mother    Hyperlipidemia Mother    Hypertension Mother    Other Mother        blood disorder- mgus   Colon polyps Father    Other Father        aorta disection   Aneurysm Father    Hypertension Sister        ?   Arthritis Sister    Other Sister        thyroid   Other Sister        thyroid   Irritable bowel syndrome Daughter    Other Daughter        gastritis   Mental illness Daughter        bipolar and mood disorder   Mental illness Son        bipolar   Colon cancer Paternal Uncle    Social History   Socioeconomic History   Marital status: Married    Spouse name: Not on file   Number of children: 2   Years of education: Not on file   Highest education level: Not on file  Occupational History   Occupation: Accounts Receivable  Tobacco Use   Smoking status: Former    Current packs/day: 0.00    Average packs/day: 1 pack/day for 30.0 years (30.0 ttl pk-yrs)    Types: Cigarettes    Start date: 05/16/1979    Quit date: 05/15/2009    Years since quitting: 13.4   Smokeless tobacco: Never  Vaping Use   Vaping status:  Never Used  Substance and Sexual Activity   Alcohol use: No   Drug use: No   Sexual activity: Never  Other  Topics Concern   Not on file  Social History Narrative   Not on file   Social Determinants of Health   Financial Resource Strain: Low Risk  (05/29/2022)   Received from [facility], [facility]   Overall Financial Resource Strain (CARDIA)    Difficulty of Paying Living Expenses: Not very hard  Food Insecurity: No Food Insecurity (10/14/2022)   Hunger Vital Sign    Worried About Running Out of Food in the Last Year: Never true    Ran Out of Food in the Last Year: Never true  Transportation Needs: No Transportation Needs (10/14/2022)   PRAPARE - Administrator, Civil Service (Medical): No    Lack of Transportation (Non-Medical): No  Physical Activity: Sufficiently Active (05/29/2022)   Received from [facility], [facility]   Exercise Vital Sign    Days of Exercise per Week: 5 days    Minutes of Exercise per Session: 30 min  Stress: Stress Concern Present (05/29/2022)   Received from [facility], [facility] - Occupational Stress Questionnaire    Feeling of Stress : To some extent  Social Connections: Moderately Integrated (05/29/2022)   Received from [facility], [facility]   Social Network    How would you rate your social network (family, work, friends)?: Adequate participation with social networks   Allergies  Allergen Reactions   Aspirin Shortness Of Breath   Nsaids Shortness Of Breath   Keflex [Cephalexin] Diarrhea   Butamben-Tetracaine-Benzocaine     Mouth sores   Dicyclomine Hcl     REACTION: mouth ulcers   Metronidazole Hives    mouth ulcers   Phenergan [Promethazine Hcl] Other (See Comments)    "knocks" pt out for about 3 days   Quetiapine     Somnolence. Slept for 36 hours straight.   Benay Spice [Lifitegrast]     Burned eyes   Cymbalta [Duloxetine Hcl] Rash   Levothyroxine Palpitations     Pt must use brand SYNTHROID   Moxifloxacin Nausea Only and Palpitations    REACTION: increased heart rate   Moxifloxacin Hcl In Nacl Palpitations   Orphenadrine Citrate Palpitations   Sulfamethoxazole-Trimethoprim Nausea Only and Palpitations    REACTION: increased heart rate, nausea    Medications   Medications Prior to Admission  Medication Sig Dispense Refill Last Dose   SYNTHROID 88 MCG tablet Take 88 mcg by mouth daily before breakfast.   10/13/2022   calcium carbonate (TUMS) 500 MG chewable tablet Chew 1-2 tablets by mouth as needed.      cetirizine (ZYRTEC) 10 MG tablet Take 10 mg by mouth as needed.      Cholecalciferol (D 1000) 25 MCG (1000 UT) capsule Take 1,000 Units by mouth daily.      escitalopram (LEXAPRO) 10 MG tablet Take 10 mg by mouth daily.      estradiol (ESTRACE) 0.1 MG/GM vaginal cream Place 1 Applicatorful vaginally 2 (two) times a week.      fexofenadine (ALLEGRA) 180 MG tablet Take 180 mg by mouth as needed.      HYDROcodone-acetaminophen (NORCO) 10-325 MG tablet Take 1 tablet by mouth every 8 (eight) hours as needed.      latanoprost (XALATAN) 0.005 % ophthalmic solution Place 1 drop into both eyes at bedtime.      lisdexamfetamine (VYVANSE) 70 MG capsule Take 70 mg by mouth daily.      nitroGLYCERIN (NITROSTAT) 0.4 MG SL tablet Place 0.4 mg under the tongue  every 5 (five) minutes as needed.      ondansetron (ZOFRAN) 4 MG tablet Take 1 tablet (4 mg total) by mouth every 8 (eight) hours as needed for nausea or vomiting. Take 1-2 tablets every 4-6 hours as needed for nausea 30 tablet 2    pantoprazole (PROTONIX) 40 MG tablet Take 1 tablet (40 mg total) by mouth daily. 90 tablet 3    raNITIdine HCl (RANITIDINE 75 PO) Take 1 tablet by mouth as needed (Hives).      rosuvastatin (CRESTOR) 20 MG tablet Take 1 tablet (20 mg total) by mouth daily. 90 tablet 3      Vitals   Vitals:   10/14/22 2342 10/14/22 2343 10/15/22 0047 10/15/22 0100  BP:   (!) 150/90 (!)  146/92  Pulse: 78 80 82 72  Resp: (!) 23 (!) 23 (!) 24 (!) 21  Temp:   98.2 F (36.8 C)   TempSrc:   Axillary   SpO2: 99% 100% 96% 96%  Weight:      Height:         Body mass index is 26.01 kg/m.  Physical Exam   Constitutional: Appears well-developed and well-nourished.  Psych: Affect appropriate to situation.  Eyes: No scleral injection.  HENT: No OP obstrucion.  Head: Normocephalic.  Cardiovascular: Normal rate and regular rhythm.  Respiratory: Effort normal, non-labored breathing.  GI: Soft.  No distension. There is no tenderness.  Skin: WDI.   Neurologic Examination  Mental status/Cognition: drowsy, oriented to self, place, month and year, good attention. Speech/language: dysarthric speech, non fluent, comprehension intact, object naming intact, repetition intact. Cranial nerves:   CN II Pupils equal and reactive to light, no VF deficits   CN III,IV,VI Left INO   CN V normal sensation in V1, V2, and V3 segments bilaterally   CN VII L facial droop   CN VIII normal hearing to speech   CN IX & X normal palatal elevation, no uvular deviation   CN XI 5/5 head turn and 5/5 shoulder shrug bilaterally   CN XII midline tongue protrusion   Motor:  Muscle bulk: normal, tone normal Mvmt Root Nerve  Muscle Right Left Comments  SA C5/6 Ax Deltoid 5 5   EF C5/6 Mc Biceps 5 5   EE C6/7/8 Rad Triceps 5 5   WF C6/7 Med FCR     WE C7/8 PIN ECU     F Ab C8/T1 U ADM/FDI 5 5   HF L1/2/3 Fem Illopsoas 5 5   KE L2/3/4 Fem Quad 5 5   DF L4/5 D Peron Tib Ant 5 5   PF S1/2 Tibial Grc/Sol 5 5    Sensation:  Light touch Mildly decreased to touch in left lower extremity.   Pin prick    Temperature    Vibration   Proprioception    Coordination/Complex Motor:  - Finger to Nose with ataxia and past-pointing noted in left upper extremity - Heel to shin with ataxia noted in bilateral lower extremities. - Rapid alternating movement are slowed BL - Gait: Deferred for patient's  safety.  Labs   CBC:  Recent Labs  Lab 10/13/22 2116 10/14/22 0457  WBC 5.2 4.8  HGB 11.9* 10.9*  HCT 36.0 35.2*  MCV 90.2 93.9  PLT 179 151    Basic Metabolic Panel:  Lab Results  Component Value Date   NA 136 10/14/2022   K 3.7 10/14/2022   CO2 24 10/14/2022   GLUCOSE 138 (H) 10/14/2022   BUN  15 10/14/2022   CREATININE 0.73 10/14/2022   CALCIUM 8.4 (L) 10/14/2022   GFRNONAA >60 10/14/2022   GFRAA  12/16/2009    >60        The eGFR has been calculated using the MDRD equation. This calculation has not been validated in all clinical situations. eGFR's persistently <60 mL/min signify possible Chronic Kidney Disease.   Lipid Panel:  Lab Results  Component Value Date   LDLCALC 51 10/05/2022   HgbA1c:  Lab Results  Component Value Date   HGBA1C 5.4 09/02/2015   Urine Drug Screen: No results found for: "LABOPIA", "COCAINSCRNUR", "LABBENZ", "AMPHETMU", "THCU", "LABBARB"  Alcohol Level No results found for: "ETH" INR  Lab Results  Component Value Date   INR 1.0 09/02/2008   APTT  Lab Results  Component Value Date   APTT 27 09/02/2008     CT Head without contrast(Personally reviewed): L PICA stroke.  CT angio Head and Neck with contrast(Personally reviewed): No LVO  MRI Brain: pending  Impression   Dava Eppler Pecha is a 62 y.o. female admitted for SBO, found to have slurred speech + L facial droop at shift change with CT Head demonstrating a L PICA stroke. She was not a candidate for tnkase due to unclear LKW. She was not a candidate for thrombectomy 2/2 no LVO.  Etiology of stroke is unclear, given the large size of her stroke and close proximity to the brainstem, she was transferred to [facility] ICU for close monitoring. Recommendations  - Frequent Neuro checks per stroke unit protocol - Recommend repeat CT Head in AM to monitor for swelling. - Recommend MRI Brain without contrast - Recommend obtaining TTE - Recommend obtaining Lipid panel with LDL -  Please start statin if LDL > 70 - Recommend HbA1c to evaluate for diabetes and how well it is controlled. - Antithrombotic - allergic to aspirin and started on plavix. - Recommend DVT ppx - SBP goal - permissive hypertension first 24 h < 220/110. Held home meds.  - Recommend Telemetry monitoring for arrythmia - Recommend bedside swallow screen prior to PO intake. - Stroke education booklet - Recommend PT/OT/SLP consult   SBO: - will need to request surgery team to continue to follow along in AM.  Sleep apnea Stable.   Chronic anemia Stable HgB   History of esophageal stricture Stable.   GERD (gastroesophageal reflux disease) Cont IV protonix.   Essential hypertension Stable. Will order prn IV labetalol. If remains with NG for >48 hours, can consider either IV lopressor or catapres patch. Looking at her home meds, pt does not appear to be on any HTN meds at home.   Hyperlipidemia Stable. Cont home statin.   Hypothyroidism Cont synthroid. ______________________________________________________________________  CODE STATUS verified with patient and patient is full code. Patient would like her husband to make medical decisions on her behalf if she is unable to make decisions by herself.  This patient is critically ill and at significant risk of neurological worsening, death and care requires constant monitoring of vital signs, hemodynamics,respiratory and cardiac monitoring, neurological assessment, discussion with family, other specialists and medical decision making of high complexity. I spent 70 minutes of neurocritical care time  in the care of  this patient. This was time spent independent of any time provided by nurse practitioner or PA.  Erick Blinks [facility] 10/15/2022  1:49 AM   Signed,  Erick Blinks

## 2022-10-15 NOTE — Progress Notes (Signed)
Progress Note     Subjective: Pt is lethargic but does open eyes. Her daughter and sister are at bedside. She has had low NGT output, last BM 9/26. Stroke workup/management ongoing. Family very concerned that she has had this ongoing abdominal pain for the last 2 weeks and we still don't have an answer what was driving that. Discussed repeat imaging and possible need for surgical intervention pending findings and they are in agreement so far.   Objective: Vital signs in last 24 hours: Temp:  [97.1 F (36.2 C)-98.7 F (37.1 C)] 97.1 F (36.2 C) (09/30 0336) Pulse Rate:  [57-102] 102 (09/30 0800) Resp:  [16-26] 20 (09/30 0800) BP: (133-177)/(79-98) 133/84 (09/30 0800) SpO2:  [90 %-100 %] 94 % (09/30 0800) Last BM Date : 10/11/22  Intake/Output from previous day: 09/29 0701 - 09/30 0700 In: 340.5 [I.V.:240.5; NG/GT:100] Out: 2150 [Urine:2150] Intake/Output this shift: Total I/O In: 75 [I.V.:75] Out: -   PE: General: WD, WN female, lethargic but able to arouse Heart: regular, rate, and rhythm.  Lungs: CTAB, no wheezes, rhonchi, or rales noted.  Respiratory effort nonlabored Abd: soft, NT, ND, NGT with scant bile tinged drainage     Lab Results:  Recent Labs    10/14/22 0457 10/15/22 0228  WBC 4.8 7.7  HGB 10.9* 13.3  HCT 35.2* 40.1  PLT 151 154   BMET Recent Labs    10/14/22 0457 10/15/22 0228 10/15/22 0726  NA 136 132* 135  K 3.7 3.7  --   CL 104 99  --   CO2 24 22  --   GLUCOSE 138* 122*  --   BUN 15 11  --   CREATININE 0.73 0.67  --   CALCIUM 8.4* 8.7*  --    PT/INR No results for input(s): "LABPROT", "INR" in the last 72 hours. CMP     Component Value Date/Time   NA 135 10/15/2022 0726   K 3.7 10/15/2022 0228   CL 99 10/15/2022 0228   CO2 22 10/15/2022 0228   GLUCOSE 122 (H) 10/15/2022 0228   BUN 11 10/15/2022 0228   CREATININE 0.67 10/15/2022 0228   CREATININE 0.91 09/02/2015 1352   CALCIUM 8.7 (L) 10/15/2022 0228   PROT 6.4 (L)  10/15/2022 0228   ALBUMIN 3.6 10/15/2022 0228   AST 21 10/15/2022 0228   ALT 17 10/15/2022 0228   ALKPHOS 80 10/15/2022 0228   BILITOT 0.6 10/15/2022 0228   GFRNONAA >60 10/15/2022 0228   GFRAA  12/16/2009 2144    >60        The eGFR has been calculated using the MDRD equation. This calculation has not been validated in all clinical situations. eGFR's persistently <60 mL/min signify possible Chronic Kidney Disease.   Lipase     Component Value Date/Time   LIPASE 10 (L) 10/13/2022 2116       Studies/Results: CT HEAD WO CONTRAST ( )  Result Date: 10/15/2022 CLINICAL DATA:  62 year old female with left PICA territory infarct on head CT yesterday. No large vessel occlusion on CTA. EXAM: CT HEAD WITHOUT CONTRAST TECHNIQUE: Contiguous axial images were obtained from the base of the skull through the vertex without intravenous contrast. RADIATION DOSE REDUCTION: This exam was performed according to the departmental dose-optimization program which includes automated exposure control, adjustment of the mA and/or kV according to patient size and/or use of iterative reconstruction technique. COMPARISON:  Head CT 10/14/2022. FINDINGS: Brain: Confluent cytotoxic edema in the left inferior and lateral cerebellum, stable from 2000 hours  yesterday. No hemorrhagic transformation. Some lower cerebellar vermis involvement. No obvious brainstem involvement by CT. No extension since yesterday. No significant posterior fossa mass effect. Basilar cisterns and 4th ventricle remain patent. Supratentorial brain maintained normal for age gray-white differentiation. No midline shift, ventriculomegaly, mass effect, evidence of mass lesion, intracranial hemorrhage. Or evidence of cortically based acute infarction. Gray-white matter differentiation is within normal limits throughout the brain. Vascular: No suspicious intracranial vascular hyperdensity. Skull: Intact, negative. Sinuses/Orbits: Right nasoenteric tube  is in place. Visualized paranasal sinuses and mastoids are stable and well aerated. Other: Stable orbit and scalp soft tissues. IMPRESSION: 1. Stable sizable Left PICA territory infarct since yesterday. Confluent cytotoxic edema but no hemorrhagic transformation, no interval extension, and no significant posterior fossa mass effect. 2. No new intracranial abnormality. Electronically Signed   By: Odessa Fleming M.D.   On: 10/15/2022 04:46   CT ANGIO HEAD NECK W WO CM W PERF (CODE STROKE)  Result Date: 10/14/2022 CLINICAL DATA:  Left PICA distribution stroke by CT EXAM: CT ANGIOGRAPHY HEAD AND NECK CT PERFUSION BRAIN TECHNIQUE: Multidetector CT imaging of the head and neck was performed using the standard protocol during bolus administration of intravenous contrast. Multiplanar CT image reconstructions and MIPs were obtained to evaluate the vascular anatomy. Carotid stenosis measurements (when applicable) are obtained utilizing NASCET criteria, using the distal internal carotid diameter as the denominator. Multiphase CT imaging of the brain was performed following IV bolus contrast injection. Subsequent parametric perfusion maps were calculated using RAPID software. RADIATION DOSE REDUCTION: This exam was performed according to the departmental dose-optimization program which includes automated exposure control, adjustment of the mA and/or kV according to patient size and/or use of iterative reconstruction technique. CONTRAST:  OMNIPAQUE IOHEXOL 350 MG/ML SOLN COMPARISON:  Head CT immediately prior FINDINGS: CTA NECK FINDINGS Aortic arch: Aortic arch appears normal. Branching pattern is normal. Minimal atherosclerosis at the proximal left subclavian artery without stenosis. Right carotid system: Common carotid artery widely patent to the bifurcation. Bifurcation and ICA bulb are normal. Cervical ICA is normal. Left carotid system: Left carotid system similarly normal. Vertebral arteries: Both vertebral artery  origins are widely patent. Both vertebral arteries are patent through the cervical region to the foramen magnum. Skeleton: Ordinary cervical spondylosis. Other neck: No mass or lymphadenopathy. Upper chest: Mild emphysema at the lung apices. Mild dependent pulmonary atelectasis. Review of the MIP images confirms the above findings CTA HEAD FINDINGS Anterior circulation: Both internal carotid arteries are patent through the skull base and siphon regions. No siphon stenosis. The anterior and middle cerebral vessels are patent. No large vessel occlusion or proximal stenosis. No aneurysm or vascular malformation. Posterior circulation: Both vertebral arteries are patent through the foramen magnum to the basilar artery. There does appear to be flow in both posteroinferior cerebellar arteries presently. Right anterior inferior cerebellar artery is noted. No definite left anterior inferior cerebellar artery. Superior cerebellar and posterior cerebral arteries show flow. Venous sinuses: Patent and normal. Anatomic variants: None significant. Review of the MIP images confirms the above findings CT Brain Perfusion Findings: ASPECTS: 10 CBF (<30%) Volume: 0mL Perfusion (Tmax>6.0s) volume: 0mL Mismatch Volume: 0mL Infarction Location:The examination is tailored to the anterior circulation. No infarction in the anterior circulation. Portion of the cerebellum involved by the infarction is not included in the region of the exam. IMPRESSION: 1. No carotid bifurcation disease. No anterior circulation pathology seen. 2. No posterior circulation large vessel occlusion or proximal stenosis. Flow appears to be present in both posteroinferior  cerebellar arteries presently. Right anterior inferior cerebellar artery is present. Left anterior inferior cerebellar artery is not seen. This could be due to occlusion, hypoplasia or aplasia. 3. No evidence of infarction in the anterior circulation territory. The portion of the cerebellum involved  by the infarction is not included in the region of the perfusion exam. 4. Emphysema at the lung apices. Mild dependent pulmonary atelectasis. Emphysema (ICD10-J43.9). Electronically Signed   By: Paulina Fusi M.D.   On: 10/14/2022 20:58   CT HEAD WO CONTRAST ( )  Result Date: 10/14/2022 CLINICAL DATA:  Stroke, hemorrhage, coach stroke. EXAM: CT HEAD WITHOUT CONTRAST TECHNIQUE: Contiguous axial images were obtained from the base of the skull through the vertex without intravenous contrast. RADIATION DOSE REDUCTION: This exam was performed according to the departmental dose-optimization program which includes automated exposure control, adjustment of the mA and/or kV according to patient size and/or use of iterative reconstruction technique. COMPARISON:  None Available. 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 The involvement of the brainstem appears quite extensive. Cerebral hemispheres appear normal without evidence of old or acute stroke. No mass, hemorrhage, hydrocephalus or extra-axial collection. Vascular: No abnormal vascular finding. 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 The involvement of the brainstem appears quite extensive. No evidence of hemorrhage. No hydrocephalus. Call report in progress. Electronically Signed   By: Paulina Fusi M.D.   On: 10/14/2022 20:13   DG Abd 1 View  Result Date: 10/14/2022 CLINICAL DATA:  NG tube placement. EXAM: ABDOMEN - 1 VIEW COMPARISON:  09/21/2022. FINDINGS: There are mildly distended loops of small bowel in the abdomen measuring up to 3.3 cm. An enteric tube terminates in the stomach. Mild atelectasis is noted at the left lung base and there is a small left pleural  effusion. No radio-opaque calculi. IMPRESSION: 1. Mildly distended loops of small bowel in the upper abdomen measuring up to 3.3 cm. 2. Enteric tube terminates in the stomach. Electronically Signed   By: Thornell Sartorius M.D.   On: 10/14/2022 02:07   DG Chest 1 View  Result Date: 10/14/2022 CLINICAL DATA:  NG tube placement. EXAM: CHEST  1 VIEW COMPARISON:  A 03/06/2021. FINDINGS: The heart is enlarged and mediastinal contours are within normal limits. There is atherosclerotic calcification of the aorta. Lung volumes are low. There is atelectasis at the left lung base. There is mild blunting of the left costophrenic angle, possible small pleural effusion. No obvious pneumothorax, however the lung apices are excluded from the field-of-view. On image 2 the side port of the enteric tube is at the level of the diaphragm and should be advanced 6 cm. Distended loops of small bowel are noted in the upper abdomen measuring up to 3.2 cm. IMPRESSION: 1. Low lung volumes with mild atelectasis at the left lung base. 2. Blunting of the left costophrenic angle, possible small pleural effusion. 3. Dilated loops of small bowel in the abdomen, possible ileus versus obstruction. 4. The side port of the enteric tube is at the gastroesophageal junction and should be advanced 6 cm. Electronically Signed   By: Thornell Sartorius M.D.   On: 10/14/2022 00:26   CT ABDOMEN PELVIS W CONTRAST  Result Date: 10/13/2022 CLINICAL DATA:  Right-sided abdominal pain for 2 days, initial encounter EXAM: CT ABDOMEN AND PELVIS WITH CONTRAST TECHNIQUE: Multidetector CT imaging  of the abdomen and pelvis was performed using the standard protocol following bolus administration of intravenous contrast. RADIATION DOSE REDUCTION: This exam was performed according to the departmental dose-optimization program which includes automated exposure control, adjustment of the mA and/or kV according to patient size and/or use of iterative reconstruction technique.  CONTRAST:  OMNIPAQUE IOHEXOL 300 MG/ML  SOLN COMPARISON:  06/04/2022 FINDINGS: Lower chest: No acute abnormality. Hepatobiliary: No focal liver abnormality is seen. No gallstones, gallbladder wall thickening, or biliary dilatation. Pancreas: Unremarkable. No pancreatic ductal dilatation or surrounding inflammatory changes. Spleen: Normal in size without focal abnormality. Adrenals/Urinary Tract: Adrenal glands are within normal limits. Kidneys demonstrate a normal enhancement pattern bilaterally. No renal calculi or obstructive changes are seen. The bladder is decompressed. Stomach/Bowel: Scattered diverticular change of the colon is noted without evidence of diverticulitis. There is a long segment of narrowing within the ascending colon best seen in the sagittal plane on image number 36 of series 6. No discrete mass is seen. These changes may be related to an internal hernia with dilatation of the cecum just proximal to this long segment of narrowing. Mild inflammatory changes are seen. Additionally there is narrowing of the terminal ileum associated with the area of narrowing in the ascending colon. Multiple fluid-filled dilated loops of small bowel are noted proximal to this in the ileum. The jejunum and stomach appear within normal limits. Vascular/Lymphatic: Aortic atherosclerosis. No enlarged abdominal or pelvic lymph nodes. Reproductive: Status post hysterectomy. No adnexal masses. Other: No abdominal wall hernia or abnormality. No abdominopelvic ascites. Musculoskeletal: No acute or significant osseous findings. IMPRESSION: Area of narrowing within the ascending colon distal to the cecum as well as an associated area of narrowing in the terminal ileum with focal dilatation of the cecum and proximal ascending colon as well as the small bowel proximal to the terminal ileum. These changes are suspicious for internal hernia causing the long segment of narrowing and subsequent small-bowel obstruction. These  changes are new from the prior CT examination. Diverticulosis without diverticulitis. Electronically Signed   By: Alcide Clever M.D.   On: 10/13/2022 22:31    Anti-infectives: Anti-infectives (From admission, onward)    None        Assessment/Plan  Right sided abdominal pain x2 weeks ?Internal hernia vs cecal volvulus  - CT 9/28: area of narrowing in ascending colon distal to cecum with associated area of narrowing in the terminal ileum with focal dilation of cecum and proximal ascending colon and small bowel proximal to terminal ileum. Suspicion for possible internal hernia  - NGT placed for decompression and KUB yesterday showed only mildly distended loops of small bowel  - abdominal exam somewhat reassuring and patient is clinically stable currently. NGT with low output but no bowel movement either  - no emergent surgical intervention planned as of this AM but recommend repeating KUB and pending what that shows and stability may repeat CT with PO contrast today as well - will discuss tentative plans with attending surgeon   Acute stroke - transferred to [facility] for [facility] team management. Discussed holding plavix this AM, got one dose yesterday. Ok for ASA 81 mg.    FEN: NPO, NGT to LIWS VTE: plavix x1 9/29 ID: no current abx, afebrile and no leukocytosis   LOS: 1 day   I reviewed last 24 h vitals and pain scores, last 48 h intake and output, last 24 h labs and trends, last 24 h imaging results, and stroke team notes  .  Juliet Rude, [facility] 10/15/2022, 9:27 AM Please see Amion for pager number during day hours 7:00am-4:30pm

## 2022-10-15 NOTE — Evaluation (Signed)
Physical Therapy Evaluation Patient Details Name: Joan Robinson MRN: [id number] DOB: October 27, 1960 Today's Date: 10/15/2022  History of Present Illness  62 yo admitted 9/28 with RUQ pain with SBO.  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 PMhx: HTN, HLD, peptic ulcer disease, diverticulitis, bipolar disorder, hypothyroidism, GERD, Hashimoto's disease.  Clinical Impression  Pt lethargic with difficulty maintaining eyes open. When eyes are open pt with difficulty fixating on target with blurry vision. Pt able to move all extremities and transition to standing with assist. Pt fatigues quickly and desaturated with activity to 87% requiring 7L with return to 5L end of session at 95%. Pt lives with spouse and is normally independent. Pt with decreased activity tolerance, balance, cognition and function who will benefit from acute therapy to maximize mobility and safety.   HR 100 SPO2 93-95% on 5L at rest        If plan is discharge home, recommend the following: A lot of help with walking and/or transfers;A lot of help with bathing/dressing/bathroom;Assistance with cooking/housework;Direct supervision/assist for medications management;Assist for transportation;Supervision due to cognitive status;Help with stairs or ramp for entrance;Direct supervision/assist for financial management   Can travel by private vehicle        Equipment Recommendations None recommended by PT  Recommendations for Other Services  OT consult;Rehab consult;Speech consult    Functional Status Assessment Patient has had a recent decline in their functional status and demonstrates the ability to make significant improvements in function in a reasonable and predictable amount of time.     Precautions / Restrictions Precautions Precautions: Fall;Other (comment) Precaution Comments: watch sats, NG tube, BP <220/110      Mobility  Bed Mobility Overal bed  mobility: Needs Assistance Bed Mobility: Supine to Sit, Sit to Supine     Supine to sit: Min assist, HOB elevated Sit to supine: Min assist   General bed mobility comments: HOB 30 degrees with min assist to rise and assist for lines. return to bed with cues for sequence, limited assist to clear legs    Transfers Overall transfer level: Needs assistance   Transfers: Sit to/from Stand Sit to Stand: Min assist           General transfer comment: min assist to rise from surface with face to face technique, cues for hand placement and pt holding therapist. pt able to perform single side step to left prior to sitting> repeated mobility sequence x 2 prior to return to bed    Ambulation/Gait               General Gait Details: not yet able  Stairs            Wheelchair Mobility     Tilt Bed    Modified Rankin (Stroke Patients Only) Modified Rankin (Stroke Patients Only) Pre-Morbid Rankin Score: No symptoms Modified Rankin: Severe disability     Balance Overall balance assessment: Needs assistance Sitting-balance support: Bilateral upper extremity supported, Feet supported Sitting balance-Leahy Scale: Poor Sitting balance - Comments: pt reliant on UB support sitting EOB with pt stating "wooziness" and blurry vision   Standing balance support: Bilateral upper extremity supported Standing balance-Leahy Scale: Poor Standing balance comment: UB support on therapist                             Pertinent Vitals/Pain Pain Assessment Pain Assessment: No/denies pain    Home Living Family/patient expects to be  discharged to:: Private residence Living Arrangements: Spouse/significant other Available Help at Discharge: Family;Available 24 hours/day Type of Home: House Home Access: Stairs to enter Entrance Stairs-Rails: Doctor, general practice of Steps: 5   Home Layout: One level Home Equipment: Agricultural consultant (2 wheels);BSC/3in1;Shower  seat;Cane - single point Additional Comments: sister has a WC if needed    Prior Function Prior Level of Function : Independent/Modified Independent;Driving                     Extremity/Trunk Assessment   Upper Extremity Assessment Upper Extremity Assessment: Generalized weakness;Defer to OT evaluation    Lower Extremity Assessment Lower Extremity Assessment: Generalized weakness (grossly 3/5, unable to fully assess due to fatigue and impaired balance)    Cervical / Trunk Assessment Cervical / Trunk Assessment: Normal  Communication   Communication Communication: No apparent difficulties  Cognition Arousal: Lethargic Behavior During Therapy: Flat affect Overall Cognitive Status: Impaired/Different from baseline Area of Impairment: Orientation, Following commands                 Orientation Level: Time     Following Commands: Follows one step commands consistently                General Comments      Exercises     Assessment/Plan    PT Assessment Patient needs continued PT services  PT Problem List Decreased mobility;Decreased activity tolerance;Decreased balance;Decreased knowledge of use of DME;Decreased coordination       PT Treatment Interventions DME instruction;Therapeutic exercise;Gait training;Functional mobility training;Cognitive remediation;Therapeutic activities;Patient/family education;Neuromuscular re-education;Balance training    PT Goals (Current goals can be found in the Care Plan section)  Acute Rehab PT Goals Patient Stated Goal: return home, garden PT Goal Formulation: With family Time For Goal Achievement: 10/29/22 Potential to Achieve Goals: Good    Frequency Min 1X/week     Co-evaluation               AM-PAC PT "6 Clicks" Mobility  Outcome Measure Help needed turning from your back to your side while in a flat bed without using bedrails?: A Little Help needed moving from lying on your back to sitting on  the side of a flat bed without using bedrails?: A Little Help needed moving to and from a bed to a chair (including a wheelchair)?: A Little Help needed standing up from a chair using your arms (e.g., wheelchair or bedside chair)?: A Lot Help needed to walk in hospital room?: Total Help needed climbing 3-5 steps with a railing? : Total 6 Click Score: 13    End of Session Equipment Utilized During Treatment: Oxygen Activity Tolerance: Patient tolerated treatment well Patient left: in bed;with call bell/phone within reach;with family/visitor present Nurse Communication: Mobility status PT Visit Diagnosis: Other abnormalities of gait and mobility (R26.89);Unsteadiness on feet (R26.81);Other symptoms and signs involving the nervous system (R29.898)    Time: 1320-1340 PT Time Calculation (min) (ACUTE ONLY): 20 min   Charges:   PT Evaluation $PT Eval Moderate Complexity: 1 Mod   PT General Charges $$ ACUTE PT VISIT: 1 Visit         Merryl Hacker, PT Acute Rehabilitation Services Office: [phone]   Enedina Finner Sarah Zerby 10/15/2022, 2:01 PM

## 2022-10-15 NOTE — Evaluation (Signed)
Occupational Therapy Evaluation Patient Details Name: Joan Robinson MRN: [id number] DOB: 06-17-1960 Today's Date: 10/15/2022   History of Present Illness 62 yo admitted 9/28 with RUQ pain with SBO.  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 PMhx: HTN, HLD, peptic ulcer disease, diverticulitis, bipolar disorder, hypothyroidism, GERD, Hashimoto's disease.   Clinical Impression   Joan Robinson was evaluated s/p the above admission list. She is indep and lives with her spouse at baseline. Upon evaluation the pt was limited by lethargy, poor activity tolerance, L lateral lean, LUE weakness/paraesthesias, blurry vision, and impaired cognition. Overall she needed min A for bed mobility and simple transfers with poor sitting balance due to L LOBs. Due to the deficits listed below the pt also needs up to max A for LB ADLs and mod A for UB ADLs. Pt reports blurry vision, she was able to read therapist badge, but unable to read room clock from her bed. Pt wears 1 contact in R eye, contact in for session. Vision assessed, see details below - she would benefit from further assessment once arousal improves. Horizontal nystagmus noted at end range laterally both L&R, pt denied dizziness. No overt field cut or neglect noted. Pt will benefit from continued acute OT services and intensive inpatient follow up therapy, >3 hours/day after discharge.        If plan is discharge home, recommend the following: A lot of help with walking and/or transfers;A lot of help with bathing/dressing/bathroom;Assistance with cooking/housework;Direct supervision/assist for medications management;Direct supervision/assist for financial management;Assist for transportation;Help with stairs or ramp for entrance    Functional Status Assessment  Patient has had a recent decline in their functional status and demonstrates the ability to make significant improvements in function in a  reasonable and predictable amount of time.  Equipment Recommendations  Other (comment) (defer)    Recommendations for Other Services Rehab consult     Precautions / Restrictions Precautions Precautions: Fall;Other (comment) Precaution Comments: watch sats, NG tube, BP <220/110 Restrictions Weight Bearing Restrictions: No      Mobility Bed Mobility Overal bed mobility: Needs Assistance Bed Mobility: Supine to Sit, Sit to Supine     Supine to sit: Min assist Sit to supine: Min assist        Transfers Overall transfer level: Needs assistance   Transfers: Sit to/from Stand Sit to Stand: Min assist                  Balance Overall balance assessment: Needs assistance Sitting-balance support: Bilateral upper extremity supported, Feet supported Sitting balance-Leahy Scale: Poor Sitting balance - Comments: reliant on LUE support to prevent L LOB   Standing balance support: Bilateral upper extremity supported Standing balance-Leahy Scale: Poor                             ADL either performed or assessed with clinical judgement   ADL Overall ADL's : Needs assistance/impaired Eating/Feeding: NPO   Grooming: Minimal assistance;Sitting   Upper Body Bathing: Moderate assistance;Sitting   Lower Body Bathing: Maximal assistance;Sit to/from stand   Upper Body Dressing : Moderate assistance;Sitting   Lower Body Dressing: Maximal assistance;Sit to/from stand   Toilet Transfer: Moderate assistance;Stand-pivot;BSC/3in1   Toileting- Clothing Manipulation and Hygiene: Maximal assistance;Sit to/from stand       Functional mobility during ADLs: Moderate assistance;Cueing for safety General ADL Comments: limited by lethargy, L lateral lean/LOB     Vision Baseline Vision/History:  1 Wears glasses Patient Visual Report: Blurring of vision Vision Assessment?: Yes;Wears glasses for reading;Wears glasses for driving (*contact in R eye) Eye Alignment: Within  Functional Limits Tracking/Visual Pursuits: Impaired - to be further tested in functional context Visual Fields: Impaired-to be further tested in functional context Additional Comments: difficult to get fully assessment due to lethargy. horizontal nystagmus noted at lateral end range both L&R. denied dizziness. Reports blurry vision (pt has baseline R contact in). L ptosis. Tracking and visual fields were [facility]. difficulty sustaining visual attention outside of midline.     Perception Perception: Not tested       Praxis Praxis: Not tested       Pertinent Vitals/Pain Pain Assessment Pain Assessment: Faces Faces Pain Scale: No hurt     Extremity/Trunk Assessment Upper Extremity Assessment Upper Extremity Assessment: Generalized weakness;LUE deficits/detail LUE Deficits / Details: globally weaker than R, reports paraesthesias throughout entire extremity. slowed and deliberate coordination. difficult to fully assess due to lethargy. proprioception seemed to be intact LUE Sensation: decreased light touch LUE Coordination: decreased fine motor   Lower Extremity Assessment Lower Extremity Assessment: Defer to PT evaluation   Cervical / Trunk Assessment Cervical / Trunk Assessment: Normal   Communication Communication Communication: Difficulty communicating thoughts/reduced clarity of speech   Cognition Arousal: Lethargic Behavior During Therapy: Flat affect Overall Cognitive Status: Impaired/Different from baseline Area of Impairment: Attention, Following commands                   Current Attention Level: Sustained   Following Commands: Follows one step commands consistently       General Comments: difficult to fully assess cognition due to lethargy. Overall follows most 1 step commands     General Comments  HR to 120s, SpO2 down to 87% on 7L            Home Living Family/patient expects to be discharged to:: Private residence Living Arrangements:  Spouse/significant other Available Help at Discharge: Family;Available 24 hours/day Type of Home: House Home Access: Stairs to enter [employer] of Steps: 5 Entrance Stairs-Rails: Right;Left Home Layout: One level               Home Equipment: Agricultural consultant (2 wheels);BSC/3in1;Shower seat;Cane - single point   Additional Comments: sister has a WC if needed      Prior Functioning/Environment Prior Level of Function : Independent/Modified Independent;Driving             Mobility Comments: indep ADLs Comments: indep, works        OT Problem List: Decreased strength;Decreased range of motion;Decreased activity tolerance;Impaired balance (sitting and/or standing);Decreased safety awareness;Decreased knowledge of precautions;Decreased knowledge of use of DME or AE;Decreased cognition;Impaired vision/perception      OT Treatment/Interventions: Self-care/ADL training;Therapeutic exercise;Neuromuscular education;DME and/or AE instruction;Therapeutic activities;Patient/family education;Balance training;Visual/perceptual remediation/compensation    OT Goals(Current goals can be found in the care plan section) Acute Rehab OT Goals Patient Stated Goal: unable to state OT Goal Formulation: With patient Time For Goal Achievement: 10/29/22 Potential to Achieve Goals: Good ADL Goals Pt Will Perform Grooming: with set-up;sitting Pt Will Perform Upper Body Dressing: with set-up;sitting Pt Will Perform Lower Body Dressing: with min assist;sit to/from stand Pt Will Transfer to Toilet: with min assist;ambulating Additional ADL Goal #1: Pt will use low vision compenstory techniques to safety navigate hosptial environment  OT Frequency: Min 1X/week       AM-PAC OT "6 Clicks" Daily Activity     Outcome Measure Help from another person eating  meals?: Total Help from another person taking care of personal grooming?: A Little Help from another person toileting, which includes  using toliet, bedpan, or urinal?: A Lot Help from another person bathing (including washing, rinsing, drying)?: A Lot Help from another person to put on and taking off regular upper body clothing?: A Lot Help from another person to put on and taking off regular lower body clothing?: A Lot 6 Click Score: 12   End of Session Equipment Utilized During Treatment: Oxygen Nurse Communication: Mobility status  Activity Tolerance: Patient tolerated treatment well Patient left: in bed;with call bell/phone within reach;with bed alarm set;with family/visitor present  OT Visit Diagnosis: Unsteadiness on feet (R26.81);Other abnormalities of gait and mobility (R26.89);Muscle weakness (generalized) (M62.81)                Time: 1429-1450 OT Time Calculation (min): 21 min Charges:  OT General Charges $OT Visit: 1 Visit OT Evaluation $OT Eval Moderate Complexity: 1 Mod  Derenda Mis, OTR/L Acute Rehabilitation Services Office [phone] Secure Chat Communication Preferred   Donia Pounds 10/15/2022, 3:14 PM

## 2022-10-15 NOTE — Progress Notes (Signed)
PT Cancellation Note  Patient Details Name: Joan Robinson MRN: [id number] DOB: 02/28/1960   Cancelled Treatment:    Reason Eval/Treat Not Completed: Patient at procedure or test/unavailable, MRI   Jearld Hemp B Denai Caba 10/15/2022, 9:11 AM Merryl Hacker, PT Acute Rehabilitation Services Office: [phone]

## 2022-10-15 NOTE — Plan of Care (Signed)

## 2022-10-15 NOTE — Progress Notes (Signed)
OT Cancellation Note  Patient Details Name: Joan Robinson MRN: [id number] DOB: [birth year]   Cancelled Treatment:    Reason Eval/Treat Not Completed: Patient at procedure or test/ unavailable (Echo tech in room upon arrival, OT to follow up when avaiable.)  Donia Pounds 10/15/2022, 2:28 PM

## 2022-10-16 ENCOUNTER — Other Ambulatory Visit: Payer: Self-pay

## 2022-10-16 ENCOUNTER — Inpatient Hospital Stay (HOSPITAL_COMMUNITY): Payer: BC Managed Care – PPO

## 2022-10-16 ENCOUNTER — Encounter (HOSPITAL_COMMUNITY)
Admission: EM | Disposition: A | Discharge status: Rehabilitation Facility | Payer: Self-pay | Source: Home / Self Care | Attending: Neurology

## 2022-10-16 DIAGNOSIS — K56609 Unspecified intestinal obstruction, unspecified as to partial versus complete obstruction: Secondary | ICD-10-CM | POA: Diagnosis not present

## 2022-10-16 DIAGNOSIS — I63542 Cerebral infarction due to unspecified occlusion or stenosis of left cerebellar artery: Secondary | ICD-10-CM | POA: Diagnosis not present

## 2022-10-16 HISTORY — PX: LAPAROSCOPIC RIGHT HEMI COLECTOMY: SHX5926

## 2022-10-16 LAB — POCT I-STAT 7, (LYTES, BLD GAS, ICA,H+H)
Acid-base deficit: 2 mmol/L (ref 0.0–2.0)
Bicarbonate: 22.5 mmol/L (ref 20.0–28.0)
Calcium, Ion: 1.2 mmol/L (ref 1.15–1.40)
HCT: 35 % — ABNORMAL LOW (ref 36.0–46.0)
Hemoglobin: 11.9 g/dL — ABNORMAL LOW (ref 12.0–15.0)
O2 Saturation: 92 %
Patient temperature: 98.1
Potassium: 3.1 mmol/L — ABNORMAL LOW (ref 3.5–5.1)
Sodium: 151 mmol/L — ABNORMAL HIGH (ref 135–145)
TCO2: 24 mmol/L (ref 22–32)
pCO2 arterial: 38 mm[Hg] (ref 32–48)
pH, Arterial: 7.378 (ref 7.35–7.45)
pO2, Arterial: 63 mm[Hg] — ABNORMAL LOW (ref 83–108)

## 2022-10-16 LAB — GLUCOSE, CAPILLARY
Glucose-Capillary: 95 mg/dL (ref 70–99)
Glucose-Capillary: 107 mg/dL — ABNORMAL HIGH (ref 70–99)
Glucose-Capillary: 157 mg/dL — ABNORMAL HIGH (ref 70–99)
Glucose-Capillary: 180 mg/dL — ABNORMAL HIGH (ref 70–99)

## 2022-10-16 LAB — COMPREHENSIVE METABOLIC PANEL
ALT: 15 U/L (ref 0–44)
AST: 18 U/L (ref 15–41)
Albumin: 3.3 g/dL — ABNORMAL LOW (ref 3.5–5.0)
Alkaline Phosphatase: 63 U/L (ref 38–126)
Anion gap: 8 (ref 5–15)
BUN: 15 mg/dL (ref 8–23)
CO2: 22 mmol/L (ref 22–32)
Calcium: 8.4 mg/dL — ABNORMAL LOW (ref 8.9–10.3)
Chloride: 118 mmol/L — ABNORMAL HIGH (ref 98–111)
Creatinine, Ser: 0.76 mg/dL (ref 0.44–1.00)
GFR, Estimated: >60 mL/min (ref >60)
Glucose, Bld: 118 mg/dL — ABNORMAL HIGH (ref 70–99)
Potassium: 3.6 mmol/L (ref 3.5–5.1)
Sodium: 148 mmol/L — ABNORMAL HIGH (ref 135–145)
Total Bilirubin: 0.8 mg/dL (ref 0.3–1.2)
Total Protein: 5.9 g/dL — ABNORMAL LOW (ref 6.5–8.1)

## 2022-10-16 LAB — CBC
HCT: 39.5 % (ref 36.0–46.0)
Hemoglobin: 12.8 g/dL (ref 12.0–15.0)
MCH: 29.8 pg (ref 26.0–34.0)
MCHC: 32.4 g/dL (ref 30.0–36.0)
MCV: 92.1 fL (ref 80.0–100.0)
Platelets: 165 10*3/uL (ref 150–400)
RBC: 4.29 MIL/uL (ref 3.87–5.11)
RDW: 12.8 % (ref 11.5–15.5)
WBC: 6.9 10*3/uL (ref 4.0–10.5)
nRBC: 0 % (ref 0.0–0.2)

## 2022-10-16 LAB — SODIUM
Sodium: 146 mmol/L — ABNORMAL HIGH (ref 135–145)
Sodium: 149 mmol/L — ABNORMAL HIGH (ref 135–145)

## 2022-10-16 SURGERY — LAPAROSCOPIC RIGHT HEMI COLECTOMY
Anesthesia: General | Laterality: Right

## 2022-10-16 MED ORDER — BUPIVACAINE-EPINEPHRINE (PF) 0.25% -1:200000 IJ SOLN
INTRAMUSCULAR | Status: AC
  Filled 2022-10-16: qty 30

## 2022-10-16 MED ORDER — CLEVIDIPINE BUTYRATE 0.5 MG/ML IV EMUL
0.0000 mg/h | INTRAVENOUS | Status: DC
  Administered 2022-10-16: 2 mg/h via INTRAVENOUS
  Administered 2022-10-17: 3 mg/h via INTRAVENOUS
  Filled 2022-10-16: qty 100

## 2022-10-16 MED ORDER — LATANOPROST 0.005 % OP SOLN
1.0000 [drp] | Freq: Every day | OPHTHALMIC | Status: DC
  Administered 2022-10-16 – 2022-10-24 (×9): 1 [drp] via OPHTHALMIC
  Filled 2022-10-16: qty 2.5

## 2022-10-16 MED ORDER — PROPOFOL 500 MG/50ML IV EMUL
INTRAVENOUS | Status: DC | PRN
  Administered 2022-10-16: 150 ug/kg/min via INTRAVENOUS

## 2022-10-16 MED ORDER — HYDROMORPHONE HCL 1 MG/ML IJ SOLN
INTRAMUSCULAR | Status: AC
  Filled 2022-10-16: qty 0.5

## 2022-10-16 MED ORDER — IOHEXOL 350 MG/ML SOLN
75.0000 mL | Freq: Once | INTRAVENOUS | Status: AC | PRN
  Administered 2022-10-16: 75 mL via INTRAVENOUS

## 2022-10-16 MED ORDER — ESMOLOL HCL 100 MG/10ML IV SOLN
INTRAVENOUS | Status: AC
  Filled 2022-10-16: qty 10

## 2022-10-16 MED ORDER — DEXAMETHASONE SODIUM PHOSPHATE 10 MG/ML IJ SOLN
INTRAMUSCULAR | Status: DC | PRN
  Administered 2022-10-16: 10 mg via INTRAVENOUS

## 2022-10-16 MED ORDER — METHOCARBAMOL 1000 MG/10ML IJ SOLN
500.0000 mg | Freq: Four times a day (QID) | INTRAVENOUS | Status: DC | PRN
  Filled 2022-10-16: qty 5

## 2022-10-16 MED ORDER — HYDROMORPHONE HCL 1 MG/ML IJ SOLN
INTRAMUSCULAR | Status: DC | PRN
  Administered 2022-10-16: .5 mg via INTRAVENOUS

## 2022-10-16 MED ORDER — LIDOCAINE HCL 1 % IJ SOLN
INTRAMUSCULAR | Status: AC
  Filled 2022-10-16: qty 20

## 2022-10-16 MED ORDER — FENTANYL CITRATE (PF) 250 MCG/5ML IJ SOLN
INTRAMUSCULAR | Status: DC | PRN
  Administered 2022-10-16: 50 ug via INTRAVENOUS
  Administered 2022-10-16 (×2): 100 ug via INTRAVENOUS

## 2022-10-16 MED ORDER — FENTANYL CITRATE (PF) 250 MCG/5ML IJ SOLN
INTRAMUSCULAR | Status: AC
  Filled 2022-10-16: qty 5

## 2022-10-16 MED ORDER — SODIUM CHLORIDE 0.9 % IV SOLN
2.0000 g | Freq: Two times a day (BID) | INTRAVENOUS | Status: AC
  Administered 2022-10-16 – 2022-10-17 (×2): 2 g via INTRAVENOUS
  Filled 2022-10-16 (×2): qty 2

## 2022-10-16 MED ORDER — ESMOLOL HCL 100 MG/10ML IV SOLN
INTRAVENOUS | Status: DC | PRN
Start: 2022-10-16 — End: 2022-10-16
  Administered 2022-10-16: 20 mg via INTRAVENOUS
  Administered 2022-10-16: 10 mg via INTRAVENOUS
  Administered 2022-10-16: 20 mg via INTRAVENOUS

## 2022-10-16 MED ORDER — BUPIVACAINE-EPINEPHRINE (PF) 0.5% -1:200000 IJ SOLN
INTRAMUSCULAR | Status: AC
  Filled 2022-10-16: qty 30

## 2022-10-16 MED ORDER — PROPOFOL 10 MG/ML IV BOLUS
INTRAVENOUS | Status: AC
  Filled 2022-10-16: qty 20

## 2022-10-16 MED ORDER — ROCURONIUM BROMIDE 10 MG/ML (PF) SYRINGE
PREFILLED_SYRINGE | INTRAVENOUS | Status: DC | PRN
  Administered 2022-10-16: 100 mg via INTRAVENOUS

## 2022-10-16 MED ORDER — SUGAMMADEX SODIUM 200 MG/2ML IV SOLN
INTRAVENOUS | Status: DC | PRN
  Administered 2022-10-16 (×2): 200 mg via INTRAVENOUS

## 2022-10-16 MED ORDER — BUPIVACAINE-EPINEPHRINE 0.25% -1:200000 IJ SOLN
INTRAMUSCULAR | Status: DC | PRN
  Administered 2022-10-16: 11 mL via INTRADERMAL

## 2022-10-16 MED ORDER — SODIUM CHLORIDE 0.9 % IV SOLN
2.0000 g | INTRAVENOUS | Status: AC
  Administered 2022-10-16: 2 g via INTRAVENOUS
  Filled 2022-10-16: qty 2

## 2022-10-16 MED ORDER — SALINE SPRAY 0.65 % NA SOLN
1.0000 | NASAL | Status: DC | PRN
  Filled 2022-10-16: qty 44

## 2022-10-16 MED ORDER — CLEVIDIPINE BUTYRATE 0.5 MG/ML IV EMUL
INTRAVENOUS | Status: DC | PRN
  Administered 2022-10-16: 1 mg/h via INTRAVENOUS

## 2022-10-16 MED ORDER — PROPOFOL 10 MG/ML IV BOLUS
INTRAVENOUS | Status: DC | PRN
  Administered 2022-10-16: 170 mg via INTRAVENOUS

## 2022-10-16 MED ORDER — ACETAMINOPHEN 10 MG/ML IV SOLN
1000.0000 mg | Freq: Four times a day (QID) | INTRAVENOUS | Status: DC
  Administered 2022-10-16 – 2022-10-17 (×3): 1000 mg via INTRAVENOUS
  Filled 2022-10-16 (×3): qty 100

## 2022-10-16 MED ORDER — CLEVIDIPINE BUTYRATE 0.5 MG/ML IV EMUL
INTRAVENOUS | Status: AC
  Administered 2022-10-16: 2 mg/h via INTRAVENOUS
  Filled 2022-10-16: qty 100

## 2022-10-16 MED ORDER — SODIUM CHLORIDE 0.9 % IR SOLN
Status: DC | PRN
Start: 2022-10-16 — End: 2022-10-16
  Administered 2022-10-16: 1000 mL

## 2022-10-16 MED ORDER — ONDANSETRON HCL 4 MG/2ML IJ SOLN
INTRAMUSCULAR | Status: DC | PRN
  Administered 2022-10-16: 4 mg via INTRAVENOUS

## 2022-10-16 MED ORDER — LIDOCAINE 2% (20 MG/ML) 5 ML SYRINGE
INTRAMUSCULAR | Status: DC | PRN
  Administered 2022-10-16: 60 mg via INTRAVENOUS

## 2022-10-16 MED ORDER — BUPIVACAINE LIPOSOME 1.3 % IJ SUSP
INTRAMUSCULAR | Status: AC
  Filled 2022-10-16: qty 20

## 2022-10-16 SURGICAL SUPPLY — 80 items
APL PRP STRL LF DISP 70% ISPRP (MISCELLANEOUS) ×1
APPLIER CLIP ROT 10 11.4 M/L (STAPLE)
APR CLP MED LRG 11.4X10 (STAPLE)
BAG COUNTER SPONGE SURGICOUNT (BAG) ×1 IMPLANT
BAG SPNG CNTER NS LX DISP (BAG) ×1
BLADE CLIPPER SURG (BLADE) IMPLANT
CANISTER SUCT 3000ML PPV (MISCELLANEOUS) ×1 IMPLANT
CELLS DAT CNTRL 66122 CELL SVR (MISCELLANEOUS) IMPLANT
CHLORAPREP W/TINT 26 (MISCELLANEOUS) ×1 IMPLANT
CLIP APPLIE ROT 10 11.4 M/L (STAPLE) IMPLANT
COVER MAYO STAND STRL (DRAPES) ×1 IMPLANT
COVER SURGICAL LIGHT HANDLE (MISCELLANEOUS) ×2 IMPLANT
DRSG OPSITE POSTOP 4X10 (GAUZE/BANDAGES/DRESSINGS) IMPLANT
DRSG OPSITE POSTOP 4X8 (GAUZE/BANDAGES/DRESSINGS) IMPLANT
DRSG TEGADERM 4X4.5 CHG (GAUZE/BANDAGES/DRESSINGS) IMPLANT
ELECT BLADE 6.5 EXT (BLADE) ×1 IMPLANT
ELECT CAUTERY BLADE 6.4 (BLADE) ×2 IMPLANT
ELECT REM PT RETURN 9FT ADLT (ELECTROSURGICAL) ×1
ELECTRODE REM PT RTRN 9FT ADLT (ELECTROSURGICAL) ×1 IMPLANT
GAUZE SPONGE 2X2 8PLY STRL LF (GAUZE/BANDAGES/DRESSINGS) IMPLANT
GEL ULTRASOUND 20GR AQUASONIC (MISCELLANEOUS) IMPLANT
GLOVE BIO SURGEON STRL SZ7.5 (GLOVE) ×2 IMPLANT
GLOVE INDICATOR 8.0 STRL GRN (GLOVE) ×2 IMPLANT
GOWN STRL REUS W/ TWL LRG LVL3 (GOWN DISPOSABLE) ×6 IMPLANT
GOWN STRL REUS W/ TWL XL LVL3 (GOWN DISPOSABLE) ×2 IMPLANT
GOWN STRL REUS W/TWL LRG LVL3 (GOWN DISPOSABLE) ×6
GOWN STRL REUS W/TWL XL LVL3 (GOWN DISPOSABLE) ×2
HANDLE SUCTION POOLE (INSTRUMENTS) ×1 IMPLANT
IRRIG SUCT STRYKERFLOW 2 WTIP (MISCELLANEOUS) ×1
IRRIGATION SUCT STRKRFLW 2 WTP (MISCELLANEOUS) ×1 IMPLANT
KIT BASIN OR (CUSTOM PROCEDURE TRAY) ×1 IMPLANT
KIT PINK PAD W/HEAD ARE REST (MISCELLANEOUS) ×1 IMPLANT
KIT PINK PAD W/HEAD ARM REST (MISCELLANEOUS) IMPLANT
KIT SIGMOIDOSCOPE (SET/KITS/TRAYS/PACK) IMPLANT
NS IRRIG 1000ML POUR BTL (IV SOLUTION) ×2 IMPLANT
PACK COLON (CUSTOM PROCEDURE TRAY) ×1 IMPLANT
PAD ARMBOARD 7.5X6 YLW CONV (MISCELLANEOUS) ×2 IMPLANT
PENCIL SMOKE EVACUATOR (MISCELLANEOUS) ×1 IMPLANT
RELOAD PROXIMATE 75MM BLUE (ENDOMECHANICALS) ×1 IMPLANT
RELOAD STAPLE 75 3.8 BLU REG (ENDOMECHANICALS) IMPLANT
RETRACTOR WND ALEXIS 18 MED (MISCELLANEOUS) IMPLANT
RETRACTOR WND ALEXIS 25 LRG (MISCELLANEOUS) IMPLANT
RTRCTR WOUND ALEXIS 18CM MED (MISCELLANEOUS)
RTRCTR WOUND ALEXIS 25CM LRG (MISCELLANEOUS) ×1
SCISSORS LAP 5X35 DISP (ENDOMECHANICALS) ×1 IMPLANT
SEALER TISSUE G2 STRG ARTC 35C (ENDOMECHANICALS) IMPLANT
SET TUBE SMOKE EVAC HIGH FLOW (TUBING) ×1 IMPLANT
SLEEVE ADV FIXATION 5X100MM (TROCAR) IMPLANT
SLEEVE Z-THREAD 5X100MM (TROCAR) ×1 IMPLANT
SPECIMEN JAR LARGE (MISCELLANEOUS) ×1 IMPLANT
STAPLER GUN LINEAR PROX 60 (STAPLE) IMPLANT
STAPLER PROXIMATE 75MM BLUE (STAPLE) IMPLANT
STAPLER VISISTAT 35W (STAPLE) ×1 IMPLANT
SUCTION POOLE HANDLE (INSTRUMENTS) ×1
SURGILUBE 2OZ TUBE FLIPTOP (MISCELLANEOUS) IMPLANT
SUT MNCRL AB 4-0 PS2 18 (SUTURE) ×1 IMPLANT
SUT PDS AB 1 CT 36 (SUTURE) IMPLANT
SUT PDS AB 1 TP1 96 (SUTURE) IMPLANT
SUT PROLENE 2 0 CT2 30 (SUTURE) IMPLANT
SUT PROLENE 2 0 KS (SUTURE) IMPLANT
SUT SILK 2 0 SH CR/8 (SUTURE) ×1 IMPLANT
SUT SILK 2 0 TIES 10X30 (SUTURE) ×1 IMPLANT
SUT SILK 3 0 SH CR/8 (SUTURE) ×1 IMPLANT
SUT SILK 3 0 TIES 10X30 (SUTURE) ×1 IMPLANT
SYR BULB IRRIG 60ML STRL (SYRINGE) ×1 IMPLANT
SYS LAPSCP GELPORT 120MM (MISCELLANEOUS)
SYS WOUND ALEXIS 18CM MED (MISCELLANEOUS) ×1
SYSTEM LAPSCP GELPORT 120MM (MISCELLANEOUS) IMPLANT
SYSTEM WOUND ALEXIS 18CM MED (MISCELLANEOUS) ×1 IMPLANT
TOWEL GREEN STERILE (TOWEL DISPOSABLE) ×1 IMPLANT
TRAY FOLEY MTR SLVR 16FR STAT (SET/KITS/TRAYS/PACK) ×1 IMPLANT
TROCAR 11X100 Z THREAD (TROCAR) IMPLANT
TROCAR ADV FIXATION 5X100MM (TROCAR) IMPLANT
TROCAR BALLN 12MMX100 BLUNT (TROCAR) IMPLANT
TROCAR Z THREAD OPTICAL 12X100 (TROCAR) IMPLANT
TROCAR Z-THREAD OPTICAL 5X100M (TROCAR) ×1 IMPLANT
TUBE CONNECTING 12X1/4 (SUCTIONS) ×2 IMPLANT
TUBING EVAC SMOKE HEATED PNEUM (TUBING) ×1 IMPLANT
WARMER LAPAROSCOPE (MISCELLANEOUS) ×1 IMPLANT
WATER STERILE IRR 1000ML POUR (IV SOLUTION) ×1 IMPLANT

## 2022-10-16 NOTE — Anesthesia Procedure Notes (Signed)
Arterial Line Insertion Start/End10/01/2022 1:03 PM, 10/16/2022 1:06 PM Performed by: Beryle Lathe, MD, anesthesiologist  Patient location: OR. Preanesthetic checklist: patient identified, IV checked, risks and benefits discussed, surgical consent, monitors and equipment checked, pre-op evaluation, timeout performed and anesthesia consent Patient sedated Left, radial was placed Catheter size: 20 G Hand hygiene performed   Attempts: 1 Procedure performed without using ultrasound guided technique. Following insertion, dressing applied and Biopatch. Post procedure assessment: unchanged and normal  Patient tolerated the procedure well with no immediate complications.

## 2022-10-16 NOTE — Anesthesia Preprocedure Evaluation (Addendum)
Anesthesia Evaluation  Patient identified by MRN, date of birth, ID band Patient awake    Reviewed: Allergy & Precautions, NPO status , Patient's Chart, lab work & pertinent test results  History of Anesthesia Complications Negative for: history of anesthetic complications  Airway Mallampati: Unable to assess   Neck ROM: Full    Dental   Pulmonary sleep apnea , COPD, former smoker    + decreased breath sounds      Cardiovascular hypertension, Pt. on medications  Rhythm:Regular Rate:Tachycardia     Neuro/Psych  Headaches PSYCHIATRIC DISORDERS Anxiety Depression Bipolar Disorder    On 3% saline for concerns of cerebellar edema related to recent stroke and risk of herniation. See neurology note from 9/30  CVA, Residual Symptoms    GI/Hepatic Neg liver ROS, hiatal hernia, PUD,GERD  Medicated,, Volvulus    Endo/Other  Hypothyroidism   Na 148 Cl 118   Renal/GU negative Renal ROS  Female GU complaint     Musculoskeletal  (+) Arthritis ,    Abdominal   Peds  (+) ATTENTION DEFICIT DISORDER WITHOUT HYPERACTIVITY Hematology negative hematology ROS (+)   Anesthesia Other Findings   Reproductive/Obstetrics                             Anesthesia Physical Anesthesia Plan  ASA: 4 and emergent  Anesthesia Plan: General   Post-op Pain Management: Ofirmev IV (intra-op)*   Induction: Intravenous  PONV Risk Score and Plan: 3 and Treatment may vary due to age or medical condition, Ondansetron and TIVA  Airway Management Planned: Oral ETT  Additional Equipment: Arterial line  Intra-op Plan:   Post-operative Plan: Possible Post-op intubation/ventilation  Informed Consent: I have reviewed the patients History and Physical, chart, labs and discussed the procedure including the risks, benefits and alternatives for the proposed anesthesia with the patient or authorized representative who has  indicated his/her understanding and acceptance.     Dental advisory given  Plan Discussed with: CRNA, Anesthesiologist and Surgeon  Anesthesia Plan Comments: (Discussed with surgeon. Unfortunately, urgent surgical intervention is required for patient's volvulus and cannot be delayed despite recent CVA and cerebellar edema. )        Anesthesia Quick Evaluation

## 2022-10-16 NOTE — Plan of Care (Signed)

## 2022-10-16 NOTE — Progress Notes (Signed)
PT Cancellation Note  Patient Details Name: Joan Robinson MRN: [id number] DOB: [birth year]   Cancelled Treatment:    Reason Eval/Treat Not Completed: Other (comment) (Sx arrival to discuss possible pending operation and deferred therapy at this time)   Enedina Finner Neave Lenger 10/16/2022, 8:08 AM Merryl Hacker, PT Acute Rehabilitation Services Office: [phone]

## 2022-10-16 NOTE — Consult Note (Addendum)
WOC Nurse requested for preoperative stoma site marking  Pt is critically ill and on the ventilator. Discussed surgical procedure and stoma creation with daughter at the bedside.  Explained role of the WOC nurse team.  Examined patient lying and sitting upright, in order to place the marking in the patient's visual field, away from any creases or abdominal contour issues and within the rectus muscle. Attempted to mark below the patient's belt line, but this was not possible, since significant creases are located lower on the abd which occur when the patient leans forward and should be avoided if possible.   Marked for colostomy in the LLQ  __6__ cm to the left of the umbilicus and __3__cm above the umbilicus.  Marked for ileostomy in the RLQ  __6__cm to the right of the umbilicus and  _3___ cm above the umbilicus. Patient's abdomen cleansed with CHG wipes at site markings, allowed to air dry prior to marking. Pt plans for surgery this morning. WOC Nurse team will follow up with patient after surgery for continued ostomy care and teaching if she receives an ostomy during surgery.  Thank-you,  Cammie Mcgee MSN, RN, CWOCN, [place], CNS [phone]

## 2022-10-16 NOTE — Anesthesia Procedure Notes (Signed)
Procedure Name: Intubation Date/Time: 10/16/2022 1:03 PM  Performed by: Kayleen Memos, CRNAPre-anesthesia Checklist: Patient identified, Emergency Drugs available, Suction available and Patient being monitored Patient Re-evaluated:Patient Re-evaluated prior to induction Oxygen Delivery Method: Circle System Utilized Preoxygenation: Pre-oxygenation with 100% oxygen Induction Type: IV induction and Rapid sequence Ventilation: Mask ventilation without difficulty Laryngoscope Size: Mac and 3 Grade View: Grade I Tube type: Oral Tube size: 7.0 mm Number of attempts: 1 Airway Equipment and Method: Stylet and Oral airway Placement Confirmation: ETT inserted through vocal cords under direct vision, positive ETCO2 and breath sounds checked- equal and bilateral Secured at: 23 cm Tube secured with: Tape Dental Injury: Teeth and Oropharynx as per pre-operative assessment

## 2022-10-16 NOTE — Progress Notes (Signed)
Inpatient Rehab Admissions:  Inpatient Rehab Consult received.  I met with patient, pt's daughter Judeth Cornfield and pt's sisters Joyce Gross and Lura Em at the bedside for rehabilitation assessment and to discuss goals and expectations of an inpatient rehab admission.  Pt was lethargic so spoke with family. Discussed average length of stay, insurance authorization requirement, discharge home after completion of CIR. Family acknowledged understanding. Family would like pt to pursue CIR. Judeth Cornfield confirmed that she will be able to provide 24/7 support for pt after discharge. Pt scheduled to go to OR today. Will continue to follow.  Signed: Wolfgang Phoenix, MS, CCC-SLP Admissions Coordinator [phone]

## 2022-10-16 NOTE — Progress Notes (Signed)
[facility]. 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 Posterior fossa mass effect with effacement of the fourth ventricle inferiorly. No evidence of obstructive hydrocephalus at this time. Mild inferior displacement of the cerebellar tonsils (which extend up to 4 mm below the level of the foramen magnum). No more than mild mass effect upon the medulla at this time. Close imaging follow-up is recommended to monitor the evolving posterior fossa mass effect. Electronically Signed   By: Jackey Loge D.O.   On: 10/15/2022 15:51   DG ABD ACUTE 2+V W 1V CHEST  Result Date:  10/15/2022 CLINICAL DATA:  Cecal volvulus EXAM: DG ABDOMEN ACUTE WITH 1 VIEW CHEST COMPARISON:  CT abdomen and pelvis dated October 13, 2022 FINDINGS: Enteric tube tip is in the stomach with side port near the GE junction. Dilated loops of large bowel seen in the right upper quadrant and multiple dilated loops of small bowel. Patient rotation limits evaluation of the lungs. Within limitations, there are mild bibasilar opacities which are likely due to atelectasis. Supine technique limits evaluation for free air. IMPRESSION: 1. 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 2. Enteric tube tip is in the stomach with side port near the GE junction, consider advancement for optimal positioning. Electronically Signed   By: Allegra Lai M.D.   On: 10/15/2022 14:59   ECHOCARDIOGRAM COMPLETE  Result Date: 10/15/2022    ECHOCARDIOGRAM REPORT   Patient Name:   Joan Robinson Date of Exam: 10/15/2022 Medical Rec #:  [id number]      Height:       66.0 in Accession #:    [id number]     Weight:       161.2 lb Date of Birth:  12/08/1960      BSA:          1.824 m Patient Age:    61 years       BP:           137/91 mmHg Patient Gender: F              HR:           100 bpm. Exam Location:  Inpatient Procedure: 2D Echo, Cardiac Doppler and Color Doppler Indications:    Stroke I63.9  History:        Patient has no prior history of Echocardiogram examinations.                 Risk Factors:Hypertension.  Sonographer:    Darlys Gales Referring Phys: [id number] [facility] KHALIQDINA IMPRESSIONS  1. Left ventricular ejection fraction, by estimation, is 60 to 65%. The left ventricle has normal function. The left ventricle has no regional wall motion abnormalities. Left ventricular diastolic parameters were normal.  2. Right ventricular systolic function is normal. The right ventricular size  is normal.  3. The mitral valve is normal in structure. Trivial mitral valve regurgitation. No evidence of mitral stenosis.  4. The aortic valve is normal in structure. Aortic valve regurgitation is not visualized. No aortic stenosis is present. FINDINGS  Left Ventricle: Left ventricular ejection fraction, by estimation, is 60 to 65%. The left ventricle has normal function. The left ventricle has no regional wall motion abnormalities. The left ventricular internal cavity size was normal in size. There is  borderline left ventricular hypertrophy. Left  Patient ID: Joan Robinson, female   DOB: [birth year], 62 y.o.   MRN: [id number] [facility] Surgery Progress Note     Subjective: CC-  Daughter at bedside. Minimal NG output. Last BM 9/26, thinks she passed a little gas this morning. States that she is having some mild intermittent crampy left sided abdominal pain.  Repeat CT with progression of bowel obstruction/cecal volvulus with prominent distension of the cecum and a focal area of twisting at the cecal neck corresponding to the zone of obstruction.  Objective: Vital signs in last 24 hours: Temp:  [98.1 F (36.7 C)-99.3 F (37.4 C)] 98.5 F (36.9 C) (10/01 0816) Pulse Rate:  [78-113] 91 (10/01 0600) Resp:  [16-25] 20 (10/01 0600) BP: (106-163)/(52-104) 133/89 (10/01 0600) SpO2:  [88 %-97 %] 92 % (10/01 0600) Last BM Date : 10/11/22  Intake/Output from previous day: 09/30 0701 - 10/01 0700 In: 1588.7 [I.V.:1588.7] Out: 1900 [Urine:1900] Intake/Output this shift: No intake/output data recorded.  PE: General: WD, WN female, lethargic but able to arouse Heart: regular, rate, and rhythm.  Lungs: CTAB, no wheezes, rhonchi, or rales noted.  Respiratory effort nonlabored Abd: soft, ND, mild subjective left sided tenderness  Lab Results:  Recent Labs    10/14/22 0457 10/15/22 0228  WBC 4.8 7.7  HGB 10.9* 13.3  HCT 35.2* 40.1  PLT 151 154   BMET Recent Labs    10/14/22 0457 10/15/22 0228 10/15/22 0726 10/15/22 1736 10/15/22 2314  NA 136 132*   < > 142 145  K 3.7 3.7  --   --   --   CL 104 99  --   --   --   CO2 24 22  --   --   --   GLUCOSE 138* 122*  --   --   --   BUN 15 11  --   --   --   CREATININE 0.73 0.67  --   --   --   CALCIUM 8.4* 8.7*  --   --   --    < > = values in this interval not displayed.   PT/INR No results for input(s): "LABPROT", "INR" in the last 72 hours. CMP     Component Value Date/Time   NA 145 10/15/2022 2314   K 3.7 10/15/2022 0228   CL 99 10/15/2022 0228   CO2 22  10/15/2022 0228   GLUCOSE 122 (H) 10/15/2022 0228   BUN 11 10/15/2022 0228   CREATININE 0.67 10/15/2022 0228   CREATININE 0.91 09/02/2015 1352   CALCIUM 8.7 (L) 10/15/2022 0228   PROT 6.4 (L) 10/15/2022 0228   ALBUMIN 3.6 10/15/2022 0228   AST 21 10/15/2022 0228   ALT 17 10/15/2022 0228   ALKPHOS 80 10/15/2022 0228   BILITOT 0.6 10/15/2022 0228   GFRNONAA >60 10/15/2022 0228   GFRAA  12/16/2009 2144    >60        The eGFR has been calculated using the MDRD equation. This calculation has not been validated in all clinical situations. eGFR's persistently <60 mL/min signify possible Chronic Kidney Disease.   Lipase     Component Value Date/Time   LIPASE 10 (L) 10/13/2022 2116       Studies/Results: CT ABDOMEN PELVIS W CONTRAST  Result Date: 10/16/2022 CLINICAL DATA:  Bowel obstruction suspected.  Abdominal pain. EXAM: CT ABDOMEN AND PELVIS WITH CONTRAST TECHNIQUE: Multidetector CT imaging of the abdomen and pelvis was performed using the standard protocol following bolus administration of intravenous contrast.  Patient ID: Joan Robinson, female   DOB: [birth year], 62 y.o.   MRN: [id number] [facility] Surgery Progress Note     Subjective: CC-  Daughter at bedside. Minimal NG output. Last BM 9/26, thinks she passed a little gas this morning. States that she is having some mild intermittent crampy left sided abdominal pain.  Repeat CT with progression of bowel obstruction/cecal volvulus with prominent distension of the cecum and a focal area of twisting at the cecal neck corresponding to the zone of obstruction.  Objective: Vital signs in last 24 hours: Temp:  [98.1 F (36.7 C)-99.3 F (37.4 C)] 98.5 F (36.9 C) (10/01 0816) Pulse Rate:  [78-113] 91 (10/01 0600) Resp:  [16-25] 20 (10/01 0600) BP: (106-163)/(52-104) 133/89 (10/01 0600) SpO2:  [88 %-97 %] 92 % (10/01 0600) Last BM Date : 10/11/22  Intake/Output from previous day: 09/30 0701 - 10/01 0700 In: 1588.7 [I.V.:1588.7] Out: 1900 [Urine:1900] Intake/Output this shift: No intake/output data recorded.  PE: General: WD, WN female, lethargic but able to arouse Heart: regular, rate, and rhythm.  Lungs: CTAB, no wheezes, rhonchi, or rales noted.  Respiratory effort nonlabored Abd: soft, ND, mild subjective left sided tenderness  Lab Results:  Recent Labs    10/14/22 0457 10/15/22 0228  WBC 4.8 7.7  HGB 10.9* 13.3  HCT 35.2* 40.1  PLT 151 154   BMET Recent Labs    10/14/22 0457 10/15/22 0228 10/15/22 0726 10/15/22 1736 10/15/22 2314  NA 136 132*   < > 142 145  K 3.7 3.7  --   --   --   CL 104 99  --   --   --   CO2 24 22  --   --   --   GLUCOSE 138* 122*  --   --   --   BUN 15 11  --   --   --   CREATININE 0.73 0.67  --   --   --   CALCIUM 8.4* 8.7*  --   --   --    < > = values in this interval not displayed.   PT/INR No results for input(s): "LABPROT", "INR" in the last 72 hours. CMP     Component Value Date/Time   NA 145 10/15/2022 2314   K 3.7 10/15/2022 0228   CL 99 10/15/2022 0228   CO2 22  10/15/2022 0228   GLUCOSE 122 (H) 10/15/2022 0228   BUN 11 10/15/2022 0228   CREATININE 0.67 10/15/2022 0228   CREATININE 0.91 09/02/2015 1352   CALCIUM 8.7 (L) 10/15/2022 0228   PROT 6.4 (L) 10/15/2022 0228   ALBUMIN 3.6 10/15/2022 0228   AST 21 10/15/2022 0228   ALT 17 10/15/2022 0228   ALKPHOS 80 10/15/2022 0228   BILITOT 0.6 10/15/2022 0228   GFRNONAA >60 10/15/2022 0228   GFRAA  12/16/2009 2144    >60        The eGFR has been calculated using the MDRD equation. This calculation has not been validated in all clinical situations. eGFR's persistently <60 mL/min signify possible Chronic Kidney Disease.   Lipase     Component Value Date/Time   LIPASE 10 (L) 10/13/2022 2116       Studies/Results: CT ABDOMEN PELVIS W CONTRAST  Result Date: 10/16/2022 CLINICAL DATA:  Bowel obstruction suspected.  Abdominal pain. EXAM: CT ABDOMEN AND PELVIS WITH CONTRAST TECHNIQUE: Multidetector CT imaging of the abdomen and pelvis was performed using the standard protocol following bolus administration of intravenous contrast.  Patient ID: Joan Robinson, female   DOB: [birth year], 62 y.o.   MRN: [id number] [facility] Surgery Progress Note     Subjective: CC-  Daughter at bedside. Minimal NG output. Last BM 9/26, thinks she passed a little gas this morning. States that she is having some mild intermittent crampy left sided abdominal pain.  Repeat CT with progression of bowel obstruction/cecal volvulus with prominent distension of the cecum and a focal area of twisting at the cecal neck corresponding to the zone of obstruction.  Objective: Vital signs in last 24 hours: Temp:  [98.1 F (36.7 C)-99.3 F (37.4 C)] 98.5 F (36.9 C) (10/01 0816) Pulse Rate:  [78-113] 91 (10/01 0600) Resp:  [16-25] 20 (10/01 0600) BP: (106-163)/(52-104) 133/89 (10/01 0600) SpO2:  [88 %-97 %] 92 % (10/01 0600) Last BM Date : 10/11/22  Intake/Output from previous day: 09/30 0701 - 10/01 0700 In: 1588.7 [I.V.:1588.7] Out: 1900 [Urine:1900] Intake/Output this shift: No intake/output data recorded.  PE: General: WD, WN female, lethargic but able to arouse Heart: regular, rate, and rhythm.  Lungs: CTAB, no wheezes, rhonchi, or rales noted.  Respiratory effort nonlabored Abd: soft, ND, mild subjective left sided tenderness  Lab Results:  Recent Labs    10/14/22 0457 10/15/22 0228  WBC 4.8 7.7  HGB 10.9* 13.3  HCT 35.2* 40.1  PLT 151 154   BMET Recent Labs    10/14/22 0457 10/15/22 0228 10/15/22 0726 10/15/22 1736 10/15/22 2314  NA 136 132*   < > 142 145  K 3.7 3.7  --   --   --   CL 104 99  --   --   --   CO2 24 22  --   --   --   GLUCOSE 138* 122*  --   --   --   BUN 15 11  --   --   --   CREATININE 0.73 0.67  --   --   --   CALCIUM 8.4* 8.7*  --   --   --    < > = values in this interval not displayed.   PT/INR No results for input(s): "LABPROT", "INR" in the last 72 hours. CMP     Component Value Date/Time   NA 145 10/15/2022 2314   K 3.7 10/15/2022 0228   CL 99 10/15/2022 0228   CO2 22  10/15/2022 0228   GLUCOSE 122 (H) 10/15/2022 0228   BUN 11 10/15/2022 0228   CREATININE 0.67 10/15/2022 0228   CREATININE 0.91 09/02/2015 1352   CALCIUM 8.7 (L) 10/15/2022 0228   PROT 6.4 (L) 10/15/2022 0228   ALBUMIN 3.6 10/15/2022 0228   AST 21 10/15/2022 0228   ALT 17 10/15/2022 0228   ALKPHOS 80 10/15/2022 0228   BILITOT 0.6 10/15/2022 0228   GFRNONAA >60 10/15/2022 0228   GFRAA  12/16/2009 2144    >60        The eGFR has been calculated using the MDRD equation. This calculation has not been validated in all clinical situations. eGFR's persistently <60 mL/min signify possible Chronic Kidney Disease.   Lipase     Component Value Date/Time   LIPASE 10 (L) 10/13/2022 2116       Studies/Results: CT ABDOMEN PELVIS W CONTRAST  Result Date: 10/16/2022 CLINICAL DATA:  Bowel obstruction suspected.  Abdominal pain. EXAM: CT ABDOMEN AND PELVIS WITH CONTRAST TECHNIQUE: Multidetector CT imaging of the abdomen and pelvis was performed using the standard protocol following bolus administration of intravenous contrast.  Patient ID: Joan Robinson, female   DOB: [birth year], 62 y.o.   MRN: [id number] [facility] Surgery Progress Note     Subjective: CC-  Daughter at bedside. Minimal NG output. Last BM 9/26, thinks she passed a little gas this morning. States that she is having some mild intermittent crampy left sided abdominal pain.  Repeat CT with progression of bowel obstruction/cecal volvulus with prominent distension of the cecum and a focal area of twisting at the cecal neck corresponding to the zone of obstruction.  Objective: Vital signs in last 24 hours: Temp:  [98.1 F (36.7 C)-99.3 F (37.4 C)] 98.5 F (36.9 C) (10/01 0816) Pulse Rate:  [78-113] 91 (10/01 0600) Resp:  [16-25] 20 (10/01 0600) BP: (106-163)/(52-104) 133/89 (10/01 0600) SpO2:  [88 %-97 %] 92 % (10/01 0600) Last BM Date : 10/11/22  Intake/Output from previous day: 09/30 0701 - 10/01 0700 In: 1588.7 [I.V.:1588.7] Out: 1900 [Urine:1900] Intake/Output this shift: No intake/output data recorded.  PE: General: WD, WN female, lethargic but able to arouse Heart: regular, rate, and rhythm.  Lungs: CTAB, no wheezes, rhonchi, or rales noted.  Respiratory effort nonlabored Abd: soft, ND, mild subjective left sided tenderness  Lab Results:  Recent Labs    10/14/22 0457 10/15/22 0228  WBC 4.8 7.7  HGB 10.9* 13.3  HCT 35.2* 40.1  PLT 151 154   BMET Recent Labs    10/14/22 0457 10/15/22 0228 10/15/22 0726 10/15/22 1736 10/15/22 2314  NA 136 132*   < > 142 145  K 3.7 3.7  --   --   --   CL 104 99  --   --   --   CO2 24 22  --   --   --   GLUCOSE 138* 122*  --   --   --   BUN 15 11  --   --   --   CREATININE 0.73 0.67  --   --   --   CALCIUM 8.4* 8.7*  --   --   --    < > = values in this interval not displayed.   PT/INR No results for input(s): "LABPROT", "INR" in the last 72 hours. CMP     Component Value Date/Time   NA 145 10/15/2022 2314   K 3.7 10/15/2022 0228   CL 99 10/15/2022 0228   CO2 22  10/15/2022 0228   GLUCOSE 122 (H) 10/15/2022 0228   BUN 11 10/15/2022 0228   CREATININE 0.67 10/15/2022 0228   CREATININE 0.91 09/02/2015 1352   CALCIUM 8.7 (L) 10/15/2022 0228   PROT 6.4 (L) 10/15/2022 0228   ALBUMIN 3.6 10/15/2022 0228   AST 21 10/15/2022 0228   ALT 17 10/15/2022 0228   ALKPHOS 80 10/15/2022 0228   BILITOT 0.6 10/15/2022 0228   GFRNONAA >60 10/15/2022 0228   GFRAA  12/16/2009 2144    >60        The eGFR has been calculated using the MDRD equation. This calculation has not been validated in all clinical situations. eGFR's persistently <60 mL/min signify possible Chronic Kidney Disease.   Lipase     Component Value Date/Time   LIPASE 10 (L) 10/13/2022 2116       Studies/Results: CT ABDOMEN PELVIS W CONTRAST  Result Date: 10/16/2022 CLINICAL DATA:  Bowel obstruction suspected.  Abdominal pain. EXAM: CT ABDOMEN AND PELVIS WITH CONTRAST TECHNIQUE: Multidetector CT imaging of the abdomen and pelvis was performed using the standard protocol following bolus administration of intravenous contrast.  Patient ID: Joan Robinson, female   DOB: [birth year], 62 y.o.   MRN: [id number] [facility] Surgery Progress Note     Subjective: CC-  Daughter at bedside. Minimal NG output. Last BM 9/26, thinks she passed a little gas this morning. States that she is having some mild intermittent crampy left sided abdominal pain.  Repeat CT with progression of bowel obstruction/cecal volvulus with prominent distension of the cecum and a focal area of twisting at the cecal neck corresponding to the zone of obstruction.  Objective: Vital signs in last 24 hours: Temp:  [98.1 F (36.7 C)-99.3 F (37.4 C)] 98.5 F (36.9 C) (10/01 0816) Pulse Rate:  [78-113] 91 (10/01 0600) Resp:  [16-25] 20 (10/01 0600) BP: (106-163)/(52-104) 133/89 (10/01 0600) SpO2:  [88 %-97 %] 92 % (10/01 0600) Last BM Date : 10/11/22  Intake/Output from previous day: 09/30 0701 - 10/01 0700 In: 1588.7 [I.V.:1588.7] Out: 1900 [Urine:1900] Intake/Output this shift: No intake/output data recorded.  PE: General: WD, WN female, lethargic but able to arouse Heart: regular, rate, and rhythm.  Lungs: CTAB, no wheezes, rhonchi, or rales noted.  Respiratory effort nonlabored Abd: soft, ND, mild subjective left sided tenderness  Lab Results:  Recent Labs    10/14/22 0457 10/15/22 0228  WBC 4.8 7.7  HGB 10.9* 13.3  HCT 35.2* 40.1  PLT 151 154   BMET Recent Labs    10/14/22 0457 10/15/22 0228 10/15/22 0726 10/15/22 1736 10/15/22 2314  NA 136 132*   < > 142 145  K 3.7 3.7  --   --   --   CL 104 99  --   --   --   CO2 24 22  --   --   --   GLUCOSE 138* 122*  --   --   --   BUN 15 11  --   --   --   CREATININE 0.73 0.67  --   --   --   CALCIUM 8.4* 8.7*  --   --   --    < > = values in this interval not displayed.   PT/INR No results for input(s): "LABPROT", "INR" in the last 72 hours. CMP     Component Value Date/Time   NA 145 10/15/2022 2314   K 3.7 10/15/2022 0228   CL 99 10/15/2022 0228   CO2 22  10/15/2022 0228   GLUCOSE 122 (H) 10/15/2022 0228   BUN 11 10/15/2022 0228   CREATININE 0.67 10/15/2022 0228   CREATININE 0.91 09/02/2015 1352   CALCIUM 8.7 (L) 10/15/2022 0228   PROT 6.4 (L) 10/15/2022 0228   ALBUMIN 3.6 10/15/2022 0228   AST 21 10/15/2022 0228   ALT 17 10/15/2022 0228   ALKPHOS 80 10/15/2022 0228   BILITOT 0.6 10/15/2022 0228   GFRNONAA >60 10/15/2022 0228   GFRAA  12/16/2009 2144    >60        The eGFR has been calculated using the MDRD equation. This calculation has not been validated in all clinical situations. eGFR's persistently <60 mL/min signify possible Chronic Kidney Disease.   Lipase     Component Value Date/Time   LIPASE 10 (L) 10/13/2022 2116       Studies/Results: CT ABDOMEN PELVIS W CONTRAST  Result Date: 10/16/2022 CLINICAL DATA:  Bowel obstruction suspected.  Abdominal pain. EXAM: CT ABDOMEN AND PELVIS WITH CONTRAST TECHNIQUE: Multidetector CT imaging of the abdomen and pelvis was performed using the standard protocol following bolus administration of intravenous contrast.

## 2022-10-16 NOTE — PMR Pre-admission (Signed)
PMR Admission Coordinator Pre-Admission Assessment  Patient: Joan Robinson is an 62 y.o., female MRN: [id number] DOB: 01/10/1961 Height: 5\' 6"  (167.6 cm) Weight: 70.9 kg  Insurance Information HMO:     PPO: yes     PCP:      IPA:      80/20:      OTHER:  PRIMARY: BCBS COMM      Policy#: [id number]      Subscriber: patient CM Name: Adine Madura      Phone#: [phone]     Fax#: [phone] Pre-Cert#: [id number] auth for CIR from [place] with [employer] with updates due to her at fax listed above on 10/18.       Employer:  Benefits:  Phone #: [phone]     Name:  Eff. Date: 5/1/124     Deduct: $1000 (met $423.60)      Out of Pocket Max: $2500 ($1352.04 met)      Life Max: n/a CIR: 80%      SNF: 80% Outpatient:      Co-Pay: $50/visit Home Health: 80%      Co-Pay: 20% DME: 80%     Co-Pay: 20% Providers: in-network SECONDARY:       Policy#:      Phone#:   Artist:       Phone#:   The Data processing manager" for patients in Inpatient Rehabilitation Facilities with attached "Privacy Act Statement-Health Care Records" was provided and verbally reviewed with: N/A  Emergency Contact Information Contact Information     Name Relation Home Work Mobile   Quebedeaux,Michael Spouse [phone]  [phone]   [facility] Daughter   [phone]   [name]   [phone]      Other Contacts   None on File     Current Medical History  Patient Admitting Diagnosis: CVA/debility   History of Present Illness: Pt is a 62 year old female  with medical hx significant for: diverticulitis (May 2024), gallstones, hiatal hernia repair (2023), HTN, hyperlipidemia, peptic ulcer disease, bipolar disorder, hypothyroidism, GERD, Hashimoto's disease. Pt presented to the ED at [facility] on 10/13/22 d/t acute onset of right-sided abdominal pain, right upper quadrant, right lower quadrant associated with intermittent nausea and vomiting for 2 days prior. CT  abdomen/pelvis showed area of narrowing within ascending colon distal to cecum as well as associated area of narrowing in terminal ileum with focal dilatation of cecum and proximal ascending colon as well as small bowel proximal to terminal ileum. Changes are suspicious for internal hernial causing long segment of narrowing and subsequent small-bowel obstruction. Diverticulosis without diverticulitis. NG tube placed. Pt transferred to [facility] on 9/28. Code stroke activated on 10/14/22 after pt noted to have slurred speech and left side facial droop. CT revealed acute left cerebellar infarct as well as possible involvement in left medulla suspicious for possible acute posterior inferior cerebellar infarct. CTA negative for LVO. Pt transferred to [facility] for further workup on 10/14/22.  MRI showed large acute infarct affecting both the left PICA and left AICA vascular territories. Repeat CT abdomen/pelvis showed progression of bowel obstruction/cecal volvulus with prominent distension of cecum and focal area of twisting at cecal neck corresponding to zone of obstruction.  Pt underwent laparoscopic assisted right hemicolectomy by Dr. Cliffton Asters on 10/16/22.  Therapy evaluations completed and CIR recommended d/t pt's deficits in functional mobility.   Complete NIHSS TOTAL: 6  Patient's medical record from [facility] has been reviewed by the rehabilitation admission coordinator and physician.  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   BCC (basal cell carcinoma of skin) 06/01/2012   Leg Follows with Dr Margo Aye   Bipolar disorder (HCC) 01/17/2007   Qualifier: Diagnosis of  By: Everardo All MD, Sean A    Cataract     Diverticulosis    Diverticulosis    Emphysema of lung (HCC)    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 Yes  Family History   family history includes Aneurysm in her father; Arthritis in her sister; Colon cancer in her paternal uncle; Colon polyps in her father; Heart disease (age of onset: 65) in her mother; Hyperlipidemia in her mother; Hypertension in her mother and sister; Irritable bowel syndrome in her daughter; Mental illness in her daughter and son; Other in her daughter, father, mother, sister, and sister; Stroke in her mother.  Current Medications  Current Facility-Administered Medications:    acetaminophen (TYLENOL) 160 MG/5ML solution 500 mg, 500 mg, Oral, Q6H, Zigmund Daniel., MD, 500 mg at 10/25/22 0847   acetaminophen (TYLENOL) tablet 650 mg, 650 mg, Oral, Q6H PRN **OR** acetaminophen (TYLENOL) suppository 650 mg, 650 mg, Rectal, Q6H PRN, Meuth, Brooke A, PA-C, 650 mg at 10/15/22 1915   albuterol (PROVENTIL) (2.5 MG/3ML) 0.083% nebulizer solution 2.5 mg, 2.5 mg, Nebulization, Q6H PRN, Meuth, Brooke A, PA-C, 2.5 mg at 10/23/22 1242   artificial tears (LACRILUBE) ophthalmic ointment, , Both Eyes, QHS, Zigmund Daniel., MD, Given at 10/24/22 2037   Chlorhexidine Gluconate Cloth 2 % PADS 6 each, 6 each, Topical, Daily, Meuth, Brooke A, PA-C, 6 each at 10/25/22 0850   clopidogrel (PLAVIX) tablet 75 mg, 75 mg, Oral, Daily, Zigmund Daniel., MD, 75 mg at 10/25/22 0846   dextrose 50 % solution 50 mL, 1 ampule, Intravenous, PRN,  Meuth, Brooke A, PA-C   feeding supplement (OSMOLITE 1.5 CAL) liquid 1,000 mL, 1,000 mL, Per Tube, Continuous, Palikh, Jeri Lager, MD, Last Rate: 50 mL/hr at 10/24/22 1559, 1,000 mL at 10/24/22 1559   food thickener (SIMPLYTHICK (HONEY/LEVEL 3/MODERATELY THICK)) 10 packet, 10 packet, Oral, PRN, Zigmund Daniel., MD   Gerhardt's butt cream, , Topical, PRN, Harolyn Rutherford, MD, Given at 10/21/22 1744   heparin injection 5,000 Units, 5,000 Units, Subcutaneous, Q8H, Meuth, Brooke A, PA-C, 5,000 Units at 10/25/22 9629   HYDROcodone-acetaminophen (NORCO/VICODIN) 5-325 MG per tablet 1 tablet, 1 tablet, Oral, QHS, Zigmund Daniel., MD, 1 tablet at 10/24/22 2215   HYDROmorphone (DILAUDID) injection 1 mg, 1 mg, Intravenous, Q3H PRN, Meuth, Brooke A, PA-C, 1 mg at 10/18/22 0345   latanoprost (XALATAN) 0.005 % ophthalmic solution 1 drop, 1 drop, Both Eyes, QHS, Meuth, Brooke A, PA-C,  1 drop at 10/24/22 2037   levothyroxine (SYNTHROID) tablet 88 mcg, 88 mcg, Oral, Q0600, Zigmund Daniel., MD, 88 mcg at 10/25/22 0605   lip balm (CARMEX) ointment, , Topical, PRN, Meuth, Brooke A, PA-C, Given at 10/15/22 2237   melatonin tablet 10 mg, 10 mg, Oral, QHS, Zigmund Daniel., MD, 10 mg at 10/25/22 0146   methocarbamol (ROBAXIN) tablet 500 mg, 500 mg, Oral, QID, Zigmund Daniel., MD, 500 mg at 10/25/22 0847   ondansetron (ZOFRAN) tablet 4 mg, 4 mg, Oral, Q6H PRN **OR** ondansetron (ZOFRAN) injection 4 mg, 4 mg, Intravenous, Q6H PRN, Zigmund Daniel., MD   Oral care mouth rinse, 15 mL, Mouth Rinse, 4 times per day, Meuth, Brooke A, PA-C, 15 mL at 10/25/22 0851   Oral care mouth rinse, 15 mL, Mouth Rinse, PRN, Meuth, Brooke A, PA-C   pantoprazole (PROTONIX) EC tablet 40 mg, 40 mg, Oral, Daily, Pham, Minh Q, RPH-CPP, 40 mg at 10/25/22 0902   polycarbophil (FIBERCON) tablet 625 mg, 625 mg, Oral, Daily, Zigmund Daniel., MD, 625 mg at 10/25/22 1610   polyvinyl alcohol (LIQUIFILM  TEARS) 1.4 % ophthalmic solution 2 drop, 2 drop, Both Eyes, QID, Zigmund Daniel., MD, 2 drop at 10/25/22 9604   prochlorperazine (COMPAZINE) injection 5 mg, 5 mg, Intravenous, Q6H PRN, Meuth, Brooke A, PA-C   rosuvastatin (CRESTOR) tablet 20 mg, 20 mg, Oral, Daily, Zigmund Daniel., MD, 20 mg at 10/25/22 0846   sodium chloride (OCEAN) 0.65 % nasal spray 1 spray, 1 spray, Each Nare, PRN, Milon Dikes, MD   traZODone (DESYREL) tablet 50 mg, 50 mg, Oral, QHS, Marcelino Duster, MD  Patients Current Diet:  Diet Order             DIET - DYS 1 Room service appropriate? Yes with Assist; Fluid consistency: Honey Thick  Diet effective now                   Precautions / Restrictions Precautions Precautions: Fall, Other (comment) Precaution Comments: watch sats Restrictions Weight Bearing Restrictions: No   Has the patient had 2 or more falls or a fall with injury in the past year? No  Prior Activity Level Community (5-7x/wk): works, drives, gets out of house ~3 days/week  Prior Functional Level Self Care: Did the patient need help bathing, dressing, using the toilet or eating? Independent  Indoor Mobility: Did the patient need assistance with walking from room to room (with or without device)? Independent  Stairs: Did the patient need assistance with internal or external stairs (with or without device)? Independent  Functional Cognition: Did the patient need help planning regular tasks such as shopping or remembering to take medications? Independent  Patient Information Are you of Hispanic, Latino/a,or Spanish origin?: A. No, not of Hispanic, Latino/a, or Spanish origin What is your race?: A. White Do you need or want an interpreter to communicate with a doctor or health care staff?: 0. No  Patient's Response To:  Health Literacy and Transportation Is the patient able to respond to health literacy and transportation needs?: Yes Health Literacy - How often do  you need to have someone help you when you read instructions, pamphlets, or other written material from your doctor or pharmacy?: Never In the past 12 months, has lack of transportation kept you from medical appointments or from getting medications?: No In the past 12 months, has lack of transportation kept you from meetings, work, or from  getting things needed for daily living?: No  Home Assistive Devices / Equipment Home Equipment: Agricultural consultant (2 wheels), BSC/3in1, Shower seat, Gilmer Mor - single point  Prior Device Use: Indicate devices/aids used by the patient prior to current illness, exacerbation or injury? None of the above  Current Functional Level Cognition  Arousal/Alertness: Awake/alert Overall Cognitive Status: Impaired/Different from baseline Current Attention Level: Selective Orientation Level: Oriented X4 Following Commands: Follows one step commands with increased time Safety/Judgement: Decreased awareness of safety, Decreased awareness of deficits General Comments: able to attend to RN while working with OT at [facility] and return attention to OT Attention: Sustained, Focused Focused Attention: Appears intact Sustained Attention: Impaired Sustained Attention Impairment: Verbal basic Problem Solving: Appears intact (for basic questions)    Extremity Assessment (includes Sensation/Coordination)  Upper Extremity Assessment: LUE deficits/detail LUE Deficits / Details: 3/5, inattention to the L. poor functional use, needs maximal cues. Able to sustain a grasp on RW LUE Sensation: decreased proprioception, decreased light touch LUE Coordination: decreased fine motor, decreased gross motor  Lower Extremity Assessment: Defer to PT evaluation    ADLs  Overall ADL's : Needs assistance/impaired Eating/Feeding: Minimal assistance, Sitting Eating/Feeding Details (indicate cue type and reason): ice chips Grooming: Minimal assistance, Sitting Upper Body Bathing: Moderate assistance,  Sitting Lower Body Bathing: Maximal assistance, Sit to/from stand Upper Body Dressing : Moderate assistance, Sitting Lower Body Dressing: Maximal assistance, +2 for physical assistance, +2 for safety/equipment, Sit to/from stand Lower Body Dressing Details (indicate cue type and reason): maximal cues for attention and physical assist Toilet Transfer: Moderate assistance, +2 for physical assistance, +2 for safety/equipment, Ambulation, Regular Toilet, Rolling walker (2 wheels) Toileting- Clothing Manipulation and Hygiene: Total assistance, +2 for physical assistance, +2 for safety/equipment, Sit to/from stand Functional mobility during ADLs: Moderate assistance, Rolling walker (2 wheels) General ADL Comments: continues to be limited by L hemi weakness, L inattention, impiared cognition. Improved mobility with needed min-modA +2 for transfers and mobiltiy    Mobility  Overal bed mobility: Needs Assistance Bed Mobility: Sit to Sidelying, Sidelying to Sit Rolling: Mod assist Sidelying to sit: Min assist, HOB elevated, Used rails Supine to sit: Contact guard Sit to supine: Min assist Sit to sidelying: Contact guard assist, Used rails General bed mobility comments: Assist to elevate trunk into sitting. Assist for balance when scooting hips to EOB.    Transfers  Overall transfer level: Needs assistance Equipment used: Rolling walker (2 wheels) Transfers: Sit to/from Stand Sit to Stand: Min assist, From elevated surface Bed to/from chair/wheelchair/BSC transfer type:: Stand pivot Stand pivot transfers: Mod assist General transfer comment: Assist to power up and stabilize. Multimodal cues for hand placement and technique.    Ambulation / Gait / Stairs / Wheelchair Mobility  Ambulation/Gait Ambulation/Gait assistance: Mod assist Gait Distance (Feet): 6 Feet (3' forward and back and 6' forward and back) Assistive device: Rolling walker (2 wheels) Gait Pattern/deviations: Step-to pattern,  Ataxic, Decreased weight shift to right, Decreased stance time - right General Gait Details: Used mirror in front of pt to work on correcting lt lean. Assist for balance and support. Stepping forward pt able to keep feet apart without cross over. Stepping baclwards more difficulty with base of support narrowing. Gait velocity: decr Gait velocity interpretation: <1.31 ft/sec, indicative of household ambulator    Posture / Balance Dynamic Sitting Balance Sitting balance - Comments: Static able to self correct Balance Overall balance assessment: Needs assistance Sitting-balance support: No upper extremity supported, Feet supported Sitting balance-Leahy Scale: Fair Sitting balance -  Comments: Static able to self correct Postural control: Left lateral lean Standing balance support: Bilateral upper extremity supported, During functional activity, Reliant on assistive device for balance Standing balance-Leahy Scale: Poor Standing balance comment: walker and min assist for static standing    Special needs/care consideration Continuous Drip IV  tube feeds and Skin midline incision   Previous Home Environment (from acute therapy documentation) Living Arrangements: Spouse/significant other  Lives With: Spouse, Family (grandson) Available Help at Discharge: Family, Available 24 hours/day Type of Home: House Home Layout: One level Home Access: Ramped entrance Entrance Stairs-Rails: Right, Left Entrance Stairs-Number of Steps: 5 Bathroom Shower/Tub: Health visitor: Standard (pt does have seat to go over toilet) Bathroom Accessibility: Yes How Accessible: Accessible via walker Home Care Services: No Additional Comments: sister has a WC if needed  Discharge Living Setting Plans for Discharge Living Setting: Patient's home Type of Home at Discharge: House Discharge Home Layout: One level Discharge Home Access: Ramped entrance Discharge Bathroom Shower/Tub: Walk-in  shower Discharge Bathroom Toilet: Standard (pt does have seat to go over toilet) Discharge Bathroom Accessibility: Yes How Accessible: Accessible via walker Does the patient have any problems obtaining your medications?: No  Social/Family/Support Systems Anticipated Caregiver: Theodoro Kalata, daughter Anticipated Caregiver's Contact Information: [phone] Caregiver Availability: 24/7 Discharge Plan Discussed with Primary Caregiver: Yes Is Caregiver In Agreement with Plan?: Yes Does Caregiver/Family have Issues with Lodging/Transportation while Pt is in Rehab?: No  Goals Patient/Family Goal for Rehab: PT/OT/SLP supervision to mod I Expected length of stay: 10-12 days Additional Information: Discharge plan: home with pt's sister providing 24/7 supervision if needed Pt/Family Agrees to Admission and willing to participate: Yes Program Orientation Provided & Reviewed with Pt/Caregiver Including Roles  & Responsibilities: Yes  Decrease burden of Care through IP rehab admission: NA  Possible need for SNF placement upon discharge: Not anticipated  Patient Condition: I have reviewed medical records from [facility], [facility], and [facility], spoken with  [facility] , and patient and family member. I met with patient at the bedside for inpatient rehabilitation assessment.  Patient will benefit from ongoing PT, OT, and SLP, can actively participate in 3 hours of therapy a day 5 days of the week, and can make measurable gains during the admission.  Patient will also benefit from the coordinated team approach during an Inpatient Acute Rehabilitation admission.  The patient will receive intensive therapy as well as Rehabilitation physician, nursing, social worker, and care management interventions.  Due to bladder management, bowel management, safety, skin/wound care, disease management, medication administration, pain management, and patient education the patient requires 24 hour a day rehabilitation  nursing.  The patient is currently min to max assist with mobility and basic ADLs.  Discharge setting and therapy post discharge at home with home health is anticipated.  Patient has agreed to participate in the Acute Inpatient Rehabilitation Program and will admit today.  Preadmission Screen Completed By: Estill Dooms, PT, DPT and Domingo Pulse, 10/25/2022 9:51 AM ______________________________________________________________________   Discussed status with Dr. Berline Chough on 10/25/22  at 9:51 AM  and received approval for admission today.  Admission Coordinator:  Estill Dooms, PT, DPT and Lauren Luvenia Starch, CCC-SLP, time 9:51 AM Dorna Bloom 10/25/22    Assessment/Plan: Diagnosis: L PICA/AICA strokes  Does the need for close, 24 hr/day Medical supervision in concert with the patient's rehab needs make it unreasonable for this patient to be served in a less intensive setting? Yes Co-Morbidities requiring supervision/potential complications: Cecal volvulus s/p hemicolectomy;  HTN, multiple strokes; PUD; hashimoto's; COPD' autoimmune Urticaria; HLD Due to bladder management, bowel management, safety, skin/wound care, disease management, medication administration, pain management, and patient education, does the patient require 24 hr/day rehab nursing? Yes Does the patient require coordinated care of a physician, rehab nurse, PT, OT, and SLP to address physical and functional deficits in the context of the above medical diagnosis(es)? Yes Addressing deficits in the following areas: balance, endurance, locomotion, strength, transferring, bowel/bladder control, bathing, dressing, feeding, grooming, toileting, swallowing, and psychosocial support Can the patient actively participate in an intensive therapy program of at least 3 hrs of therapy 5 days a week? Yes The potential for patient to make measurable gains while on inpatient rehab is good Anticipated functional outcomes upon discharge from  inpatient rehab: modified independent and supervision PT, modified independent and supervision OT, modified independent and supervision SLP Estimated rehab length of stay to reach the above functional goals is: 10-12 days Anticipated discharge destination: Home 10. Overall Rehab/Functional Prognosis: good   MD Signature:

## 2022-10-16 NOTE — Progress Notes (Signed)
STROKE TEAM PROGRESS NOTE   INTERVAL HISTORY Her multiple family members are at the bedside.  Patient in bed lethargic, but still easily open eyes, answer questions appropriately, fully orientated. Moving all extremities, still has left peripheral facial droop and left eye mild disconjugate. CT abd/pelvis concerning of progression of SBO. Will go to OR today.   Vitals:   10/16/22 1100 10/16/22 1135 10/16/22 1200 10/16/22 1525  BP: (!) 156/96  134/68   Pulse: 88  78   Resp: (!) 26  19   Temp:  99 F (37.2 C)  97.7 F (36.5 C)  TempSrc:  Axillary  Axillary  SpO2: 94%  98%   Weight:      Height:       CBC:  Recent Labs  Lab 10/15/22 0228 10/16/22 0744  WBC 7.7 6.9  HGB 13.3 12.8  HCT 40.1 39.5  MCV 90.3 92.1  PLT 154 165   Basic Metabolic Panel:  Recent Labs  Lab 10/15/22 0228 10/15/22 0726 10/16/22 0744 10/16/22 1039  NA 132*   < > 148* 146*  K 3.7  --  3.6  --   CL 99  --  118*  --   CO2 22  --  22  --   GLUCOSE 122*  --  118*  --   BUN 11  --  15  --   CREATININE 0.67  --  0.76  --   CALCIUM 8.7*  --  8.4*  --    < > = values in this interval not displayed.   Lipid Panel:  Recent Labs  Lab 10/15/22 0228  CHOL 142  TRIG 60  HDL 76  CHOLHDL 1.9  VLDL 12  LDLCALC 54   HgbA1c:  Recent Labs  Lab 10/15/22 0228  HGBA1C 5.6    IMAGING past 24 hours CT ABDOMEN PELVIS W CONTRAST  Result Date: 10/16/2022 CLINICAL DATA:  Bowel obstruction suspected.  Abdominal pain. EXAM: CT ABDOMEN AND PELVIS WITH CONTRAST TECHNIQUE: Multidetector CT imaging of the abdomen and pelvis was performed using the standard protocol following bolus administration of intravenous contrast. RADIATION DOSE REDUCTION: This exam was performed according to the departmental dose-optimization program which includes automated exposure control, adjustment of the mA and/or kV according to patient size and/or use of iterative reconstruction technique. CONTRAST:  75mL OMNIPAQUE IOHEXOL 350 MG/ML  SOLN COMPARISON:  10/13/2022 FINDINGS: Lower chest: Atelectasis or consolidation in the lung bases. Small pleural effusions. Enteric tube with tip in the stomach. Hepatobiliary: 1 cm diameter cyst in the liver, unchanged. Gallbladder is distended. Small stones suggested in the gallbladder neck. No bile duct dilatation. Pancreas: Unremarkable. No pancreatic ductal dilatation or surrounding inflammatory changes. Spleen: Normal in size without focal abnormality. Adrenals/Urinary Tract: Adrenal glands are unremarkable. Kidneys are normal, without renal calculi, focal lesion, or hydronephrosis. Bladder is decompressed with a Foley catheter. Stomach/Bowel: The stomach is decompressed but there is again prominent gaseous distention of proximal small bowel and of the cecum with a focal area of twisting at the cecal neck corresponding to the zone of obstruction. The distal colon is decompressed. This is consistent with cecal volvulus. No pneumatosis or portal venous gas to suggest bowel ischemia at this time. There is progression since the previous study. Increasing mesenteric edema and ascites since prior study. Vascular/Lymphatic: Aortic atherosclerosis. No enlarged abdominal or pelvic lymph nodes. Reproductive: Status post hysterectomy. No adnexal masses. Other: No free air in the abdomen. Mild abdominal and pelvic free fluid, likely ascites. This is progressing  since prior study. Abdominal wall musculature appears intact. Musculoskeletal: No acute or significant osseous findings. IMPRESSION: 1. 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 There is progression since previous study. 2. Increasing free fluid and mesenteric fluid. 3. Distended gallbladder with probable stone in the gallbladder neck. 4. Small bilateral pleural effusions with consolidation or atelectasis in the lung bases. 5. Aortic atherosclerosis. Electronically Signed   By: Burman Nieves M.D.   On: 10/16/2022 03:14     PHYSICAL EXAM Physical Exam  Constitutional: Appears well-developed and well-nourished.  Psych: Affect appropriate to situation Eyes: Normal external eye and conjunctiva. HENT: Normocephalic, no lesions, without obvious abnormality.   Musculoskeletal-no joint tenderness, deformity or swelling Cardiovascular: Normal rate and regular rhythm.  Respiratory: Effort normal, non-labored breathing saturations WNL GI: Soft.  No distension. There is no tenderness.  Skin: WDI   Neuro:  Pt lethargic, eyes open on voice easily, orientated to age, place, time and people. No aphasia, but paucity of speech, answer questions with short words, following all simple commands. Able to name and repeat in mildly dysarthric voice. No gaze palsy but eyes disconjugate with left eye mildly upward position, eye movement intact bilaterally except mildly decreased left eye upward and downward movement, visual field full. Left peripheral facial droop with decreased eye blinking on the left. Tongue midline. Muscle strength bilaterally symmetrical except LUE finger grip 4+/5. Significant left FTN and HTS ataxia. Sensation symmetrical bilaterally subjectively, gait not tested.      ASSESSMENT/PLAN Joan Robinson is a 62 y.o. female with past medical history of esophageal strictures status post dilation, multiple hernias status post multiple surgeries, GERD/PUD, hypertension, hyperlipidemia, autoimmune urticaria, emphysema, sleep apnea who presents to the ED with right upper quadrant and right lower quadrant abdominal pain x 2 days associated nausea.  She was found to have small bowel obstruction and was being managed inpatient at [facility].  Then, noted to have left facial droop, slurring of her speech, and CT head without contrast which was notable for a moderately large left PICA territory infarct. CT angio head and neck with and without contrast was negative for any large vessel occlusion.  Specifically, no  basilar occlusion or thrombosis noted.  CT perfusion with no core or mismatch noted.  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 Confluent cytotoxic edema but no hemorrhagic transformation, no interval extension, and no significant posterior fossa mass effect. CTA head & neck left AICA not visualized Ct perfusion: no core MRI Large acute infarct (affecting both the left PICA and left AICA vascular territories). . Posterior fossa mass effect with effacement of the fourth ventricle inferiorly. No evidence of obstructive hydrocephalus at this time.  CT repeat in am 2D Echo EF 60-65% LDL 54 HgbA1c 5.6 VTE prophylaxis - heparin subcutaneous  No antithrombotic prior to admission, not on antithrombotics now. Pt allergic to ASA and avoid plavix now given potential surgical intervention. Therapy recommendations:  CIR Disposition:  pending  Cerebellar edema Mild mass effect to compress on the brainstem Risk of developing hydrocephalus On 3% saline Need close clinical and imaging monitoring CT repeat tomorrow  SBO Pt admitted for abdominal pain x 2 days with N/V Found to have SBO, may related to her previous multiple surgeries for hernia Surgery on board Now NPO with NG suction KUB - Dilated loops of large bowel seen in the right upper  quadrant and multiple dilated loops of small bowel, 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 CT abd/pelvis progression of SBO Plan to OR today  Hypertension Home meds:  none Stable Long-term BP goal normotensive  Hyperlipidemia Home meds:  crestor 20mg   LDL 54, goal < 70 Resume home crestor 20 once po access Continue statin at discharge  Other Stroke Risk Factors Sleep apnea, not on CPAP at home  Other Active Problems Esophageal stricture s/p dilation  PUD multiple  hernias status post multiple surgeries  GERD Autoimmune urticaria  Emphysema   Hospital day # 2  This patient is critically ill due to large cerebellar infarct, cerebellar edema, SBO and at significant risk of neurological worsening, death form hydrocephalus, brain herniation, acute abdominal syndrome. This patient's care requires constant monitoring of vital signs, hemodynamics, respiratory and cardiac monitoring, review of multiple databases, neurological assessment, discussion with family, other specialists and medical decision making of high complexity. I spent 40 minutes of neurocritical care time in the care of this patient. I had long discussion with sister and daughter and other family members at bedside, updated pt current condition, treatment plan and potential prognosis, and answered all the questions. They expressed understanding and appreciation.    Marvel Plan, MD PhD Stroke Neurology 10/16/2022 4:13 PM       To contact Stroke Continuity provider, please refer to [email or web address]. After hours, contact General Neurology

## 2022-10-16 NOTE — Op Note (Signed)
PATIENT: Joan Robinson  62 y.o. female  Patient Care Team: April Manson, NP as PCP - General (Family Medicine) Rollene Rotunda, MD as PCP - Cardiology (Cardiology) Sherrie George, MD as Consulting Physician (Ophthalmology) Barrett Henle, MD (Ophthalmology) Orpah Cobb, MD as Consulting Physician (Cardiology) Donnetta Hail, MD as Consulting Physician (Rheumatology) Jerrell Mylar, MD as Consulting Physician (Gynecology)  PREOP DIAGNOSIS: Cecal volvulus  POSTOP DIAGNOSIS: Same  PROCEDURE: Laparoscopic converted to open right hemicolectomy  SURGEON: Stephanie Coup. Genice Kimberlin, MD  ASSISTANT: OR Staff  ANESTHESIA: General endotracheal  EBL: 100 mL Total I/O In: 942.1 [I.V.:942.1] Out: 250 [Urine:250]  DRAINS: None  SPECIMEN:  Right colon (including terminal ileum, appendix, cecum, ascending colon) Additional ileum (ischemic)  COUNTS: Sponge, needle and instrument counts were reported correct x2  FINDINGS:  Cecal volvulus with mobile cecum in her right upper quadrant and additionally a single adhesive band tethering the ileocolic mesentery to the abdominal wall on which this had volvulized.  No frank necrosis but certainly had a dusky type appearance to it.  Right hemicolectomy was carried out.  Additional segment of ileum was resected as this appeared questionably viable and wanted to ensure we had nice healthy bowel for her planned ileocolic anastomosis.   NARRATIVE:  The patient was identified & brought into the operating room, placed supine on the operating table and SCDs were applied to the lower extremities. General endotracheal anesthesia was induced. Pressure points were then padded. She was positioned supine with left arm tucked. Antibiotics were administered. A foley catheter was placed under sterile conditions. Hair in the region of planned surgery was clipped. The abdomen was prepped and draped in a sterile fashion. A timeout was performed confirming  our patient and plan.   Beginning with the extraction port, a supraumbilical incision was made and carried down to the midline fascia. This was then incised with electrocautery. The peritoneum was identified and elevated between clamps and carefully opened sharply. A small Alexis wound protector with a cap and associated port was then placed. The abdomen was insufflated to 15 mmHg with CO2. A laparoscope was placed and camera inspection revealed no evidence of injury. Bilateral TAP blocks were then performed under laparoscopic visualization using a mixture of 0.25% marcaine with epinepherine + Exparel.  There are some adhesions in her left upper quadrant related to her prior surgeries that were not in the way for this procedure and therefore left alone.  She does have a single adhesion in her right upper quadrant tethering what appeared to be a port site down to her ileocolic mesentery.  She has a highly mobile cecum with her cecum and appendix in her right upper quadrant.  This appears to have twisted around this adhesive band.  The cecum is massively dilated and dusky.  There is no frank full-thickness necrosis of the colon or any black segments.  Using 1 additional 5 mm port that was placed in direct visualization in her left lower quadrant, we are able to sharply lyse this adhesive band.  In doing so, were able to release much of her cecum which now will reach into her left upper quadrant without any difficulty.  Given how dilated this was and heavy, we opted to proceed with conversion to an open type procedure.  Pneumoperitoneum was released.  The cecum was delivered into the wound protector.  This is highly mobile.  Were able to evaluate the mesentery and her duodenum is well medial and within the abdominal compartment.  Her Briannia Laba line of Toldt has been displaced almost externally at this point.  We did incise this to facilitate further mobilization.  Her hepatic flexure attachments are virtually  nonexistent.  The ileocolic pedicle is identified.  We opted to stay very close to her terminal ileum and colon.  A window was created in the mesentery and the duodenum was confirmed to be down.  This was then ligated and divided using the Enseal device.  The pedicles inspected noted to be hemostatic.  The terminal ileum was identified.  A window was created the mesentery and the terminal ileum was then divided with a GIA 75 mm blue load stapler.  The staple line is inspected noted at well-formed staples and hemostatic.  A Tanja Port is placed on the proximal end of the ileal staple line to assist in maintaining orientation.  The distal point of planned transection is identified on the proximal transverse colon and location that is clearly fed by the middle colic vessels.  There is a palpable pulse in the mesentery going out to the segment.  A window was created mesentery at this location and the colon was divided using a GIA 75 blue load stapler.  Staples are inspected and noted to be both hemostatic and well-formed.  The intervening mesentery between the terminal ileum and the proximalmost transverse colon is then ligated and divided using the Enseal device.  Again, we are able to confirm the location of the duodenum prior to mesenteric division and this is well away.  The cut edge of the mesentery is inspected noted to be hemostatic.  The specimen was passed off.  The ileum was identified again and there is a slight dusky type appearance to approximately 3 to 4 inches.  For these reasons, we opted to resect this segment using a GIA 75 blue stapler and ligated the mesentery staying close to the small intestine using the Enseal device.  The mesenteric surface is inspected and hemostatic.  Attention is then turned to creating ileocolic anastomosis.  Orientation is confirmed such a there is no twisting of the small bowel and the mesentery has a natural lady.  A side-to-side, functional end-to-end ileocolic  anastomosis is then fashioned after creating a respective enterotomy and colotomy.  This was done using a 75 mm GIA blue load stapler.  The staple line is inspected noted to be both hemostatic with well-formed staples.  The common enterotomy is then elevated with Allis clamps.  The anastomotic staple line is slightly distracted from an overlying position.  This is then closed using a TA 60 blue load stapler.  The staple line is inspected noted to have well-formed staples and hemostatic.  The corners of the TA staple line are then subsequently dunked using 2-0 silk suture.  A 2-0 silk stitch is also placed at the apex of the anastomosis.  Anastomosis is palpated and found widely patent, 3 fingerbreadths in length.  This is then placed back into the abdomen in its anatomic configuration.  The abdomen is irrigated hemostasis verified.  Omentum was brought down over the midline.  The single 5 mm laparoscopic port was removed under direct visualization and the site noted to be hemostatic. The Alexis wound protector was removed, counts were reported correct, and we switched to clean instruments, gowns and drapes.  The fascia was then closed using two running #1 PDS sutures.  The skin of all incision sites was closed with 4-0 monocryl subcuticular suture. Dermabond was placed on the port sites and a  sterile dressing was placed over the abdominal incision. All counts were reported correct. She was then awakened from anesthesia, extubated and plans underway for transport back to the intensive care unit.

## 2022-10-16 NOTE — TOC CM/SW Note (Signed)
Transition of Care [facility]) - Inpatient Brief Assessment   Patient Details  Name: Joan Robinson MRN: [id number] Date of Birth: [birth year]  [facility]) CM/SW Contact:    Tom-Johnson, Hershal Coria, RN Phone Number: 10/16/2022, 9:12 AM   Clinical Narrative:  Patient presented to the [facility] ED with Rt upper Lower Quadrant Abdominal pain with N/V x 2 days. Patient was found to have Small Bowel Obstruction.  Patient noted to have Lt Facial Droop and Slurring Speech. Code stroke was activated and CT head without contrast showed a moderately Large Lt PICA territory Infarct.  Patient was transferred to [facility] ICU for further eval and Management. Neurology following.  General Sx following for SBO and plans for Laparoscopic Rt Colectomy with possible Ostomy today 10/16/22.   CIR recommended and following for possible admission when patient is medically stable.   CM will continue to follow as patient progresses with care towards discharge.           Transition of Care Asessment: Insurance and Status: Insurance coverage has been reviewed Patient has primary care physician: Yes Home environment has been reviewed: Yes Prior level of function:: Independent Prior/Current Home Services: No current home services Social Determinants of Health Reivew: SDOH reviewed no interventions necessary Readmission risk has been reviewed: Yes Transition of care needs: transition of care needs identified, TOC will continue to follow

## 2022-10-16 NOTE — Transfer of Care (Addendum)
Immediate Anesthesia Transfer of Care Note  Patient: Joan Robinson  Procedure(s) Performed: LAPAROSCOPIC ASSISTED RIGHT HEMI COLECTOMY (Right)  Patient Location: ICU  Anesthesia Type:General  Level of Consciousness: awake and responds to stimulation  Airway & Oxygen Therapy: Patient Spontanous Breathing and Patient connected to face mask oxygen  Post-op Assessment: Report given to RN and Post -op Vital signs reviewed and stable  Post vital signs: Reviewed  Last Vitals:  Vitals Value Taken Time  BP    Temp    Pulse 105 10/16/22 1523  Resp 25 10/16/22 1523  SpO2 92 % 10/16/22 1523  Vitals shown include unfiled device data.  Last Pain:  Vitals:   10/16/22 1200  TempSrc:   PainSc: 0-No pain      Patients Stated Pain Goal: 0 (10/14/22 1915)  Complications: No notable events documented.

## 2022-10-16 NOTE — Progress Notes (Addendum)
SLP Cancellation Note  Patient Details Name: Joan Robinson MRN: [id number] DOB: 03-05-1960   Cancelled treatment:        Abdominal imaging concerning for SBO.  Spoke with RN who plans to keep pt NPO pending review by treating team to determine plan.  SLP will hold swallowing assessment at this time and follow for PO readiness.  It pt is appropriate for PO trials later today, please reach out to [facility] SLP group via secure chat, or call acute rehab dept at [phone].   Kerrie Pleasure, MA, CCC-SLP Acute Rehabilitation Services Office: [phone] 10/16/2022, 7:52 AM

## 2022-10-17 ENCOUNTER — Inpatient Hospital Stay (HOSPITAL_COMMUNITY): Payer: BC Managed Care – PPO

## 2022-10-17 ENCOUNTER — Encounter (HOSPITAL_COMMUNITY): Payer: Self-pay | Admitting: Surgery

## 2022-10-17 DIAGNOSIS — I639 Cerebral infarction, unspecified: Secondary | ICD-10-CM | POA: Diagnosis not present

## 2022-10-17 DIAGNOSIS — K56609 Unspecified intestinal obstruction, unspecified as to partial versus complete obstruction: Secondary | ICD-10-CM | POA: Diagnosis not present

## 2022-10-17 LAB — BASIC METABOLIC PANEL
Anion gap: 5 (ref 5–15)
BUN: 21 mg/dL (ref 8–23)
CO2: 25 mmol/L (ref 22–32)
Calcium: 8 mg/dL — ABNORMAL LOW (ref 8.9–10.3)
Chloride: 126 mmol/L — ABNORMAL HIGH (ref 98–111)
Creatinine, Ser: 0.88 mg/dL (ref 0.44–1.00)
GFR, Estimated: >60 mL/min (ref >60)
Glucose, Bld: 119 mg/dL — ABNORMAL HIGH (ref 70–99)
Potassium: 4 mmol/L (ref 3.5–5.1)
Sodium: 156 mmol/L — ABNORMAL HIGH (ref 135–145)

## 2022-10-17 LAB — CBC
HCT: 37.7 % (ref 36.0–46.0)
Hemoglobin: 12.5 g/dL (ref 12.0–15.0)
MCH: 30.6 pg (ref 26.0–34.0)
MCHC: 33.2 g/dL (ref 30.0–36.0)
MCV: 92.2 fL (ref 80.0–100.0)
Platelets: 188 10*3/uL (ref 150–400)
RBC: 4.09 MIL/uL (ref 3.87–5.11)
RDW: 13.2 % (ref 11.5–15.5)
WBC: 7.4 10*3/uL (ref 4.0–10.5)
nRBC: 0 % (ref 0.0–0.2)

## 2022-10-17 LAB — COMPREHENSIVE METABOLIC PANEL
ALT: 16 U/L (ref 0–44)
AST: 18 U/L (ref 15–41)
Albumin: 3 g/dL — ABNORMAL LOW (ref 3.5–5.0)
Alkaline Phosphatase: 60 U/L (ref 38–126)
Anion gap: 9 (ref 5–15)
BUN: 13 mg/dL (ref 8–23)
CO2: 23 mmol/L (ref 22–32)
Calcium: 8.4 mg/dL — ABNORMAL LOW (ref 8.9–10.3)
Chloride: 118 mmol/L — ABNORMAL HIGH (ref 98–111)
Creatinine, Ser: 0.79 mg/dL (ref 0.44–1.00)
GFR, Estimated: >60 mL/min (ref >60)
Glucose, Bld: 170 mg/dL — ABNORMAL HIGH (ref 70–99)
Potassium: 3.1 mmol/L — ABNORMAL LOW (ref 3.5–5.1)
Sodium: 150 mmol/L — ABNORMAL HIGH (ref 135–145)
Total Bilirubin: 0.8 mg/dL (ref 0.3–1.2)
Total Protein: 6.2 g/dL — ABNORMAL LOW (ref 6.5–8.1)

## 2022-10-17 LAB — SODIUM
Sodium: 149 mmol/L — ABNORMAL HIGH (ref 135–145)
Sodium: 153 mmol/L — ABNORMAL HIGH (ref 135–145)
Sodium: 154 mmol/L — ABNORMAL HIGH (ref 135–145)

## 2022-10-17 LAB — GLUCOSE, CAPILLARY
Glucose-Capillary: 175 mg/dL — ABNORMAL HIGH (ref 70–99)
Glucose-Capillary: 145 mg/dL — ABNORMAL HIGH (ref 70–99)
Glucose-Capillary: 131 mg/dL — ABNORMAL HIGH (ref 70–99)
Glucose-Capillary: 114 mg/dL — ABNORMAL HIGH (ref 70–99)

## 2022-10-17 MED ORDER — ACETAMINOPHEN 10 MG/ML IV SOLN
1000.0000 mg | Freq: Four times a day (QID) | INTRAVENOUS | Status: AC
  Administered 2022-10-17 – 2022-10-18 (×4): 1000 mg via INTRAVENOUS
  Filled 2022-10-17 (×4): qty 100

## 2022-10-17 MED ORDER — PROCHLORPERAZINE EDISYLATE 10 MG/2ML IJ SOLN: 5.0000 mg | Freq: Four times a day (QID) | INTRAMUSCULAR | Status: DC | PRN

## 2022-10-17 MED ORDER — LEVOTHYROXINE SODIUM 100 MCG/5ML IV SOLN
50.0000 ug | Freq: Every day | INTRAVENOUS | Status: DC
  Filled 2022-10-17: qty 5

## 2022-10-17 MED ORDER — POTASSIUM CHLORIDE 20 MEQ PO PACK
40.0000 meq | PACK | Freq: Once | ORAL | Status: AC
  Administered 2022-10-17: 40 meq
  Filled 2022-10-17: qty 2

## 2022-10-17 MED ORDER — POTASSIUM CHLORIDE 10 MEQ/100ML IV SOLN
10.0000 meq | INTRAVENOUS | Status: AC
  Administered 2022-10-17 – 2022-10-18 (×4): 10 meq via INTRAVENOUS
  Filled 2022-10-17 (×4): qty 100

## 2022-10-17 MED ORDER — PROCHLORPERAZINE EDISYLATE 10 MG/2ML IJ SOLN: 10.0000 mg | Freq: Four times a day (QID) | INTRAMUSCULAR | Status: DC | PRN

## 2022-10-17 MED ORDER — METHOCARBAMOL 1000 MG/10ML IJ SOLN
500.0000 mg | Freq: Four times a day (QID) | INTRAVENOUS | Status: DC
  Administered 2022-10-17 – 2022-10-19 (×8): 500 mg via INTRAVENOUS
  Filled 2022-10-17: qty 500
  Filled 2022-10-17: qty 5
  Filled 2022-10-17 (×4): qty 500
  Filled 2022-10-17: qty 5
  Filled 2022-10-17 (×2): qty 500

## 2022-10-17 MED ORDER — SODIUM CHLORIDE 0.9 % IV SOLN: INTRAVENOUS | Status: DC

## 2022-10-17 MED ORDER — POTASSIUM CHLORIDE CRYS ER 20 MEQ PO TBCR: 20.0000 meq | EXTENDED_RELEASE_TABLET | ORAL | Status: DC

## 2022-10-17 MED ORDER — LEVOTHYROXINE SODIUM 100 MCG/5ML IV SOLN
44.0000 ug | Freq: Every day | INTRAVENOUS | Status: DC
  Administered 2022-10-17 – 2022-10-18 (×2): 44 ug via INTRAVENOUS
  Filled 2022-10-17 (×3): qty 5

## 2022-10-17 NOTE — Progress Notes (Signed)
STROKE TEAM PROGRESS NOTE   INTERVAL HISTORY Her husband is at bedside.  Patient had abdominal surgery yesterday, went smoothly.  Today patient more awake alert, asking for water.  CT repeat unchanged cerebellar infarct, no hemorrhage, no hydrocephalus.  Still n.p.o. until bowel movement.  Vitals:   10/17/22 1630 10/17/22 1700 10/17/22 1800 10/17/22 2013  BP: (!) 159/94 (!) 144/84 (!) 141/81   Pulse: (!) 101 (!) 105 68   Resp: 15 19 14    Temp:    98.8 F (37.1 C)  TempSrc:    Oral  SpO2: 98% 96% 98%   Weight:      Height:       CBC:  Recent Labs  Lab 10/16/22 0744 10/16/22 2255 10/17/22 0629  WBC 6.9  --  7.4  HGB 12.8 11.9* 12.5  HCT 39.5 35.0* 37.7  MCV 92.1  --  92.2  PLT 165  --  188   Basic Metabolic Panel:  Recent Labs  Lab 10/16/22 0744 10/16/22 1039 10/16/22 2255 10/17/22 0054 10/17/22 0629 10/17/22 1128 10/17/22 1715  NA 148*   < > 151*   < > 150* 153* 154*  K 3.6  --  3.1*  --  3.1*  --   --   CL 118*  --   --   --  118*  --   --   CO2 22  --   --   --  23  --   --   GLUCOSE 118*  --   --   --  170*  --   --   BUN 15  --   --   --  13  --   --   CREATININE 0.76  --   --   --  0.79  --   --   CALCIUM 8.4*  --   --   --  8.4*  --   --    < > = values in this interval not displayed.   Lipid Panel:  Recent Labs  Lab 10/15/22 0228  CHOL 142  TRIG 60  HDL 76  CHOLHDL 1.9  VLDL 12  LDLCALC 54   HgbA1c:  Recent Labs  Lab 10/15/22 0228  HGBA1C 5.6    IMAGING past 24 hours CT HEAD WO CONTRAST ( )  Result Date: 10/17/2022 CLINICAL DATA:  Stroke follow-up EXAM: CT HEAD WITHOUT CONTRAST TECHNIQUE: Contiguous axial images were obtained from the base of the skull through the vertex without intravenous contrast. RADIATION DOSE REDUCTION: This exam was performed according to the departmental dose-optimization program which includes automated exposure control, adjustment of the mA and/or kV according to patient size and/or use of iterative  reconstruction technique. COMPARISON:  10/15/2022 FINDINGS: Brain: Unchanged distribution of hypoattenuation within the left cerebellar hemisphere. No acute hemorrhage. No hydrocephalus. Vascular: Negative Skull: Negative Sinuses/Orbits: Partial opacification of the right ethmoid air cells. Normal orbits. Ocular lens replacements. Other: None IMPRESSION: Unchanged appearance of left cerebellar infarct. No acute hemorrhage. Electronically Signed   By: [name] M.D.   On: 10/17/2022 03:35   DG CHEST PORT 1 VIEW  Result Date: 10/17/2022 CLINICAL DATA:  Respiratory failure EXAM: PORTABLE CHEST 1 VIEW COMPARISON:  10/15/2022 FINDINGS: Gastric catheter is noted within the stomach. Cardiac shadow is stable. Small effusions are again seen bilaterally with basilar atelectasis. The overall appearance is stable from the prior study. IMPRESSION: Small effusions and basilar atelectasis. Electronically Signed   By: Alcide Clever M.D.   On: 10/17/2022 00:00  PHYSICAL EXAM Physical Exam  Constitutional: Appears well-developed and well-nourished.  Psych: Affect appropriate to situation Eyes: Normal external eye and conjunctiva. HENT: Normocephalic, no lesions, without obvious abnormality.   Musculoskeletal-no joint tenderness, deformity or swelling Cardiovascular: Normal rate and regular rhythm.  Respiratory: Effort normal, non-labored breathing saturations WNL GI: Soft.  No distension. There is no tenderness.  Skin: WDI   Neuro:  Pt lethargic, but more awake alert than yesterday, eyes open spontaneously, orientated to age, place, time and people. No aphasia, but paucity of speech, answer questions with short words, following all simple commands. Able to name and repeat in mildly dysarthric voice. No gaze palsy but eyes disconjugate with left eye mildly upward position, eye movement intact bilaterally except mildly decreased left eye upward and downward movement, visual field full. Left peripheral facial  droop with decreased eye blinking on the left. Tongue midline. Muscle strength bilaterally symmetrical. Significant left FTN and HTS ataxia. Sensation symmetrical bilaterally, gait not tested.      ASSESSMENT/PLAN Joan Robinson is a 62 y.o. female with past medical history of esophageal strictures status post dilation, multiple hernias status post multiple surgeries, GERD/PUD, hypertension, hyperlipidemia, autoimmune urticaria, emphysema, sleep apnea who presents to the ED with right upper quadrant and right lower quadrant abdominal pain x 2 days associated nausea.  She was found to have small bowel obstruction and was being managed inpatient at [facility].  Then, noted to have left facial droop, slurring of her speech, and CT head without contrast which was notable for a moderately large left PICA territory infarct. CT angio head and neck with and without contrast was negative for any large vessel occlusion.  Specifically, no basilar occlusion or thrombosis noted.  CT perfusion with no core or mismatch noted.  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 Confluent cytotoxic edema but no hemorrhagic transformation, no interval extension, and no significant posterior fossa mass effect. CTA head & neck left AICA not visualized Ct perfusion: no core MRI Large acute infarct (affecting both the left PICA and left AICA vascular territories). . Posterior fossa mass effect with effacement of the fourth ventricle inferiorly. No evidence of obstructive hydrocephalus at this time.  CT repeat 10/2 showed unchanged appearance of left cerebellar infarct.  No acute hemorrhage. 2D Echo EF 60-65% LDL 54 HgbA1c 5.6 VTE prophylaxis - heparin subcutaneous  No antithrombotic prior to admission, not on antithrombotics now. Pt allergic to ASA and avoid plavix now given potential  surgical intervention. Therapy recommendations:  CIR Disposition:  pending  Cerebellar edema Mild mass effect to compress on the brainstem Risk of developing hydrocephalus On 3% saline @75  Need close clinical and imaging monitoring CT repeat 10/2 stable infarct, no hemorrhage, no hydrocephalus Sodium 150-153-154 Sodium check every 6 hours  SBO Pt admitted for abdominal pain x 2 days with N/V Found to have SBO, may related to her previous multiple surgeries for hernia Surgery on board Now NPO with NG suction KUB - Dilated loops of large bowel seen in the right upper quadrant and multiple dilated loops of small bowel, 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 CT abd/pelvis progression of SBO Status post surgery treatment 10/1  Hypertension Home meds:  none Stable Long-term BP goal normotensive  Hyperlipidemia Home meds:  crestor 20mg   LDL 54, goal < 70 Resume home crestor 20 once po access Continue statin  at discharge  Other Stroke Risk Factors Sleep apnea, not on CPAP at home  Other Active Problems Esophageal stricture s/p dilation  PUD multiple hernias status post multiple surgeries  GERD Autoimmune urticaria  Emphysema  Hypokalemia, supplemented  Hospital day # 3  This patient is critically ill due to large cerebellar infarct, cerebellar edema, SBO and at significant risk of neurological worsening, death form hydrocephalus, brain herniation, acute abdominal syndrome. This patient's care requires constant monitoring of vital signs, hemodynamics, respiratory and cardiac monitoring, review of multiple databases, neurological assessment, discussion with family, other specialists and medical decision making of high complexity. I spent 35 minutes of neurocritical care time in the care of this patient.    Marvel Plan, MD PhD Stroke Neurology 10/17/2022 8:32 PM       To contact Stroke Continuity provider, please  refer to [email or web address]. After hours, contact General Neurology

## 2022-10-17 NOTE — Progress Notes (Signed)
tip is in the stomach with side port near the GE junction. Dilated loops of large bowel seen in the right upper quadrant and multiple dilated loops of small bowel. Patient rotation limits evaluation of the lungs. Within limitations, there are mild bibasilar opacities which are likely due to atelectasis. Supine technique limits evaluation for free air. IMPRESSION: 1. 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 2. Enteric tube tip is in the stomach with side port near the GE junction, consider advancement for optimal positioning. Electronically Signed    By: Allegra Lai M.D.   On: 10/15/2022 14:59   ECHOCARDIOGRAM COMPLETE  Result Date: 10/15/2022    ECHOCARDIOGRAM REPORT   Patient Name:   Joan Robinson Date of Exam: 10/15/2022 Medical Rec #:  [id number]      Height:       66.0 in Accession #:    [id number]     Weight:       161.2 lb Date of Birth:  Apr 29, 1960      BSA:          1.824 m Patient Age:    62 years       BP:           137/91 mmHg Patient Gender: F              HR:           100 bpm. Exam Location:  Inpatient Procedure: 2D Echo, Cardiac Doppler and Color Doppler Indications:    Stroke I63.9  History:        Patient has no prior history of Echocardiogram examinations.                 Risk Factors:Hypertension.  Sonographer:    Darlys Gales Referring Phys: [id number] [facility] KHALIQDINA IMPRESSIONS  1. Left ventricular ejection fraction, by estimation, is 60 to 65%. The left ventricle has normal function. The left ventricle has no regional wall motion abnormalities. Left ventricular diastolic parameters were normal.  2. Right ventricular systolic function is normal. The right ventricular size is normal.  3. The mitral valve is normal in structure. Trivial mitral valve regurgitation. No evidence of mitral stenosis.  4. The aortic valve is normal in structure. Aortic valve regurgitation is not visualized. No aortic stenosis is present. FINDINGS  Left Ventricle: Left ventricular ejection fraction, by estimation, is 60 to 65%. The left ventricle has normal function. The left ventricle has no regional wall motion abnormalities. The left ventricular internal cavity size was normal in size. There is  borderline left ventricular hypertrophy. Left ventricular diastolic parameters were normal. Right Ventricle: The right ventricular size is normal. No increase in right ventricular wall thickness. Right ventricular systolic function is normal. Left Atrium: Left atrial size was normal in size. Right Atrium: Right atrial size was normal in size. Pericardium: There is  no evidence of pericardial effusion. Mitral Valve: The mitral valve is normal in structure. Trivial mitral valve regurgitation. No evidence of mitral valve stenosis. Tricuspid Valve: The tricuspid valve is normal in structure. Tricuspid valve regurgitation is trivial. No evidence of tricuspid stenosis. Aortic Valve: The aortic valve is normal in structure. Aortic valve regurgitation is not visualized. No aortic stenosis is present. Aortic valve mean gradient measures 4.0 mmHg. Aortic valve peak gradient measures 6.2 mmHg. Aortic valve area, by VTI measures 2.27 cm. Pulmonic Valve: The  Patient ID: Joan Robinson, female   DOB: April 03, 1960, 62 y.o.   MRN: [id number] [facility] Surgery Progress Note  1 Day Post-Op  Subjective: CC-  On NRB this morning. Abdomen sore but pain medication helps. Dilaudid does make her a little sleepy. She was able to sit on edge of bed. Does not like NG. Denies n/v. Reports passing some flatus, no BM. NG output minimal. Repeat CT head over night stable.  Objective: Vital signs in last 24 hours: Temp:  [97.1 F (36.2 C)-99 F (37.2 C)] 97.1 F (36.2 C) (10/02 0743) Pulse Rate:  [78-120] 98 (10/02 0945) Resp:  [10-27] 11 (10/02 0945) BP: (134-176)/(68-130) 176/98 (10/02 0600) SpO2:  [84 %-98 %] 89 % (10/02 0945) Arterial Line BP: (134-179)/(63-83) 154/76 (10/02 0945) FiO2 (%):  [55 %-100 %] 55 % (10/02 0330) Last BM Date : 10/11/22  Intake/Output from previous day: 10/01 0701 - 10/02 0700 In: 2822.7 [I.V.:2062.7; NG/GT:360; IV Piggyback:400] Out: 4400 [Urine:4300; Blood:100] Intake/Output this shift: Total I/O In: 173.9 [I.V.:173.9] Out: 325 [Urine:325]  PE: General: lethargic but able to arouse and answer questions appropriately Heart: RRR Lungs: Respiratory effort nonlabored on NRB Abd: soft, mild distension, hypoactive bowel sounds, appropriately tender over incision, honeycomb to midline with some dried blood at inferior aspect  Lab Results:  Recent Labs    10/16/22 0744 10/16/22 2255 10/17/22 0629  WBC 6.9  --  7.4  HGB 12.8 11.9* 12.5  HCT 39.5 35.0* 37.7  PLT 165  --  188   BMET Recent Labs    10/16/22 0744 10/16/22 1039 10/16/22 2255 10/17/22 0054 10/17/22 0629  NA 148*   < > 151* 149* 150*  K 3.6  --  3.1*  --  3.1*  CL 118*  --   --   --  118*  CO2 22  --   --   --  23  GLUCOSE 118*  --   --   --  170*  BUN 15  --   --   --  13  CREATININE 0.76  --   --   --  0.79  CALCIUM 8.4*  --   --   --  8.4*   < > = values in this interval not displayed.   PT/INR No results for input(s): "LABPROT",  "INR" in the last 72 hours. CMP     Component Value Date/Time   NA 150 (H) 10/17/2022 0629   K 3.1 (L) 10/17/2022 0629   CL 118 (H) 10/17/2022 0629   CO2 23 10/17/2022 0629   GLUCOSE 170 (H) 10/17/2022 0629   BUN 13 10/17/2022 0629   CREATININE 0.79 10/17/2022 0629   CREATININE 0.91 09/02/2015 1352   CALCIUM 8.4 (L) 10/17/2022 0629   PROT 6.2 (L) 10/17/2022 0629   ALBUMIN 3.0 (L) 10/17/2022 0629   AST 18 10/17/2022 0629   ALT 16 10/17/2022 0629   ALKPHOS 60 10/17/2022 0629   BILITOT 0.8 10/17/2022 0629   GFRNONAA >60 10/17/2022 0629   GFRAA  12/16/2009 2144    >60        The eGFR has been calculated using the MDRD equation. This calculation has not been validated in all clinical situations. eGFR's persistently <60 mL/min signify possible Chronic Kidney Disease.   Lipase     Component Value Date/Time   LIPASE 10 (L) 10/13/2022 2116       Studies/Results: CT HEAD WO CONTRAST ( )  Result Date: 10/17/2022 CLINICAL DATA:  Stroke follow-up EXAM: CT HEAD WITHOUT  Patient ID: Joan Robinson, female   DOB: April 03, 1960, 62 y.o.   MRN: [id number] [facility] Surgery Progress Note  1 Day Post-Op  Subjective: CC-  On NRB this morning. Abdomen sore but pain medication helps. Dilaudid does make her a little sleepy. She was able to sit on edge of bed. Does not like NG. Denies n/v. Reports passing some flatus, no BM. NG output minimal. Repeat CT head over night stable.  Objective: Vital signs in last 24 hours: Temp:  [97.1 F (36.2 C)-99 F (37.2 C)] 97.1 F (36.2 C) (10/02 0743) Pulse Rate:  [78-120] 98 (10/02 0945) Resp:  [10-27] 11 (10/02 0945) BP: (134-176)/(68-130) 176/98 (10/02 0600) SpO2:  [84 %-98 %] 89 % (10/02 0945) Arterial Line BP: (134-179)/(63-83) 154/76 (10/02 0945) FiO2 (%):  [55 %-100 %] 55 % (10/02 0330) Last BM Date : 10/11/22  Intake/Output from previous day: 10/01 0701 - 10/02 0700 In: 2822.7 [I.V.:2062.7; NG/GT:360; IV Piggyback:400] Out: 4400 [Urine:4300; Blood:100] Intake/Output this shift: Total I/O In: 173.9 [I.V.:173.9] Out: 325 [Urine:325]  PE: General: lethargic but able to arouse and answer questions appropriately Heart: RRR Lungs: Respiratory effort nonlabored on NRB Abd: soft, mild distension, hypoactive bowel sounds, appropriately tender over incision, honeycomb to midline with some dried blood at inferior aspect  Lab Results:  Recent Labs    10/16/22 0744 10/16/22 2255 10/17/22 0629  WBC 6.9  --  7.4  HGB 12.8 11.9* 12.5  HCT 39.5 35.0* 37.7  PLT 165  --  188   BMET Recent Labs    10/16/22 0744 10/16/22 1039 10/16/22 2255 10/17/22 0054 10/17/22 0629  NA 148*   < > 151* 149* 150*  K 3.6  --  3.1*  --  3.1*  CL 118*  --   --   --  118*  CO2 22  --   --   --  23  GLUCOSE 118*  --   --   --  170*  BUN 15  --   --   --  13  CREATININE 0.76  --   --   --  0.79  CALCIUM 8.4*  --   --   --  8.4*   < > = values in this interval not displayed.   PT/INR No results for input(s): "LABPROT",  "INR" in the last 72 hours. CMP     Component Value Date/Time   NA 150 (H) 10/17/2022 0629   K 3.1 (L) 10/17/2022 0629   CL 118 (H) 10/17/2022 0629   CO2 23 10/17/2022 0629   GLUCOSE 170 (H) 10/17/2022 0629   BUN 13 10/17/2022 0629   CREATININE 0.79 10/17/2022 0629   CREATININE 0.91 09/02/2015 1352   CALCIUM 8.4 (L) 10/17/2022 0629   PROT 6.2 (L) 10/17/2022 0629   ALBUMIN 3.0 (L) 10/17/2022 0629   AST 18 10/17/2022 0629   ALT 16 10/17/2022 0629   ALKPHOS 60 10/17/2022 0629   BILITOT 0.8 10/17/2022 0629   GFRNONAA >60 10/17/2022 0629   GFRAA  12/16/2009 2144    >60        The eGFR has been calculated using the MDRD equation. This calculation has not been validated in all clinical situations. eGFR's persistently <60 mL/min signify possible Chronic Kidney Disease.   Lipase     Component Value Date/Time   LIPASE 10 (L) 10/13/2022 2116       Studies/Results: CT HEAD WO CONTRAST ( )  Result Date: 10/17/2022 CLINICAL DATA:  Stroke follow-up EXAM: CT HEAD WITHOUT  Patient ID: Joan Robinson, female   DOB: April 03, 1960, 62 y.o.   MRN: [id number] [facility] Surgery Progress Note  1 Day Post-Op  Subjective: CC-  On NRB this morning. Abdomen sore but pain medication helps. Dilaudid does make her a little sleepy. She was able to sit on edge of bed. Does not like NG. Denies n/v. Reports passing some flatus, no BM. NG output minimal. Repeat CT head over night stable.  Objective: Vital signs in last 24 hours: Temp:  [97.1 F (36.2 C)-99 F (37.2 C)] 97.1 F (36.2 C) (10/02 0743) Pulse Rate:  [78-120] 98 (10/02 0945) Resp:  [10-27] 11 (10/02 0945) BP: (134-176)/(68-130) 176/98 (10/02 0600) SpO2:  [84 %-98 %] 89 % (10/02 0945) Arterial Line BP: (134-179)/(63-83) 154/76 (10/02 0945) FiO2 (%):  [55 %-100 %] 55 % (10/02 0330) Last BM Date : 10/11/22  Intake/Output from previous day: 10/01 0701 - 10/02 0700 In: 2822.7 [I.V.:2062.7; NG/GT:360; IV Piggyback:400] Out: 4400 [Urine:4300; Blood:100] Intake/Output this shift: Total I/O In: 173.9 [I.V.:173.9] Out: 325 [Urine:325]  PE: General: lethargic but able to arouse and answer questions appropriately Heart: RRR Lungs: Respiratory effort nonlabored on NRB Abd: soft, mild distension, hypoactive bowel sounds, appropriately tender over incision, honeycomb to midline with some dried blood at inferior aspect  Lab Results:  Recent Labs    10/16/22 0744 10/16/22 2255 10/17/22 0629  WBC 6.9  --  7.4  HGB 12.8 11.9* 12.5  HCT 39.5 35.0* 37.7  PLT 165  --  188   BMET Recent Labs    10/16/22 0744 10/16/22 1039 10/16/22 2255 10/17/22 0054 10/17/22 0629  NA 148*   < > 151* 149* 150*  K 3.6  --  3.1*  --  3.1*  CL 118*  --   --   --  118*  CO2 22  --   --   --  23  GLUCOSE 118*  --   --   --  170*  BUN 15  --   --   --  13  CREATININE 0.76  --   --   --  0.79  CALCIUM 8.4*  --   --   --  8.4*   < > = values in this interval not displayed.   PT/INR No results for input(s): "LABPROT",  "INR" in the last 72 hours. CMP     Component Value Date/Time   NA 150 (H) 10/17/2022 0629   K 3.1 (L) 10/17/2022 0629   CL 118 (H) 10/17/2022 0629   CO2 23 10/17/2022 0629   GLUCOSE 170 (H) 10/17/2022 0629   BUN 13 10/17/2022 0629   CREATININE 0.79 10/17/2022 0629   CREATININE 0.91 09/02/2015 1352   CALCIUM 8.4 (L) 10/17/2022 0629   PROT 6.2 (L) 10/17/2022 0629   ALBUMIN 3.0 (L) 10/17/2022 0629   AST 18 10/17/2022 0629   ALT 16 10/17/2022 0629   ALKPHOS 60 10/17/2022 0629   BILITOT 0.8 10/17/2022 0629   GFRNONAA >60 10/17/2022 0629   GFRAA  12/16/2009 2144    >60        The eGFR has been calculated using the MDRD equation. This calculation has not been validated in all clinical situations. eGFR's persistently <60 mL/min signify possible Chronic Kidney Disease.   Lipase     Component Value Date/Time   LIPASE 10 (L) 10/13/2022 2116       Studies/Results: CT HEAD WO CONTRAST ( )  Result Date: 10/17/2022 CLINICAL DATA:  Stroke follow-up EXAM: CT HEAD WITHOUT  tip is in the stomach with side port near the GE junction. Dilated loops of large bowel seen in the right upper quadrant and multiple dilated loops of small bowel. Patient rotation limits evaluation of the lungs. Within limitations, there are mild bibasilar opacities which are likely due to atelectasis. Supine technique limits evaluation for free air. IMPRESSION: 1. 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 2. Enteric tube tip is in the stomach with side port near the GE junction, consider advancement for optimal positioning. Electronically Signed    By: Allegra Lai M.D.   On: 10/15/2022 14:59   ECHOCARDIOGRAM COMPLETE  Result Date: 10/15/2022    ECHOCARDIOGRAM REPORT   Patient Name:   Joan Robinson Date of Exam: 10/15/2022 Medical Rec #:  [id number]      Height:       66.0 in Accession #:    [id number]     Weight:       161.2 lb Date of Birth:  Apr 29, 1960      BSA:          1.824 m Patient Age:    62 years       BP:           137/91 mmHg Patient Gender: F              HR:           100 bpm. Exam Location:  Inpatient Procedure: 2D Echo, Cardiac Doppler and Color Doppler Indications:    Stroke I63.9  History:        Patient has no prior history of Echocardiogram examinations.                 Risk Factors:Hypertension.  Sonographer:    Darlys Gales Referring Phys: [id number] [facility] KHALIQDINA IMPRESSIONS  1. Left ventricular ejection fraction, by estimation, is 60 to 65%. The left ventricle has normal function. The left ventricle has no regional wall motion abnormalities. Left ventricular diastolic parameters were normal.  2. Right ventricular systolic function is normal. The right ventricular size is normal.  3. The mitral valve is normal in structure. Trivial mitral valve regurgitation. No evidence of mitral stenosis.  4. The aortic valve is normal in structure. Aortic valve regurgitation is not visualized. No aortic stenosis is present. FINDINGS  Left Ventricle: Left ventricular ejection fraction, by estimation, is 60 to 65%. The left ventricle has normal function. The left ventricle has no regional wall motion abnormalities. The left ventricular internal cavity size was normal in size. There is  borderline left ventricular hypertrophy. Left ventricular diastolic parameters were normal. Right Ventricle: The right ventricular size is normal. No increase in right ventricular wall thickness. Right ventricular systolic function is normal. Left Atrium: Left atrial size was normal in size. Right Atrium: Right atrial size was normal in size. Pericardium: There is  no evidence of pericardial effusion. Mitral Valve: The mitral valve is normal in structure. Trivial mitral valve regurgitation. No evidence of mitral valve stenosis. Tricuspid Valve: The tricuspid valve is normal in structure. Tricuspid valve regurgitation is trivial. No evidence of tricuspid stenosis. Aortic Valve: The aortic valve is normal in structure. Aortic valve regurgitation is not visualized. No aortic stenosis is present. Aortic valve mean gradient measures 4.0 mmHg. Aortic valve peak gradient measures 6.2 mmHg. Aortic valve area, by VTI measures 2.27 cm. Pulmonic Valve: The

## 2022-10-17 NOTE — Progress Notes (Signed)
Inpatient Rehab Admissions Coordinator:   Following for my colleague Wolfgang Phoenix.  Post op pain today.  Lethargic.  I will follow up at bedside tomorrow.   Estill Dooms, PT, DPT Admissions Coordinator [phone] 10/17/22  2:51 PM

## 2022-10-17 NOTE — Consult Note (Signed)
WOC Nurse ostomy consult note Patient was marked preoperatively for an ostomy.  She did not get a diversion. No further WOC needs at this time.  Will not follow at this time.  Please re-consult if needed.  Mike Gip MSN, RN, FNP-BC CWON Wound, Ostomy, Continence Nurse Outpatient [facility] [phone] Pager [phone]

## 2022-10-17 NOTE — Progress Notes (Signed)
SLP Cancellation Note  Patient Details Name: Joan Robinson MRN: [id number] DOB: 03/11/1960   Cancelled treatment:        Orders for cognitive linguistic assessment received and appreciated.  Pt was very lethargic.  RN reports recent administration of dilaudid, but previously alert and answering questions appropriate per RN and family.  Pt unable to participate in assessment at this time.  Pt is not yet ready for PO trials for swallow assessment at this time.  Pt with hemicolectomy yesterday. SLP will continue to follow for assessment.  Discussed plan with family at bedside briefly.   Kerrie Pleasure, MA, CCC-SLP Acute Rehabilitation Services Office: [phone] 10/17/2022, 9:29 AM

## 2022-10-17 NOTE — Plan of Care (Signed)
  Problem: Education: Goal: Knowledge of General Education information will improve Description: Including pain rating scale, medication(s)/side effects and non-pharmacologic comfort measures Outcome: Progressing   Problem: Activity: Goal: Risk for activity intolerance will decrease Outcome: Progressing   Problem: Coping: Goal: Level of anxiety will decrease Outcome: Progressing   Problem: Pain Managment: Goal: General experience of comfort will improve Outcome: Progressing   Problem: Safety: Goal: Ability to remain free from injury will improve Outcome: Progressing   Problem: Skin Integrity: Goal: Risk for impaired skin integrity will decrease Outcome: Progressing   Problem: Clinical Measurements: Goal: Ability to maintain clinical measurements within normal limits will improve Outcome: Not Progressing Goal: Respiratory complications will improve Outcome: Not Progressing   Problem: Nutrition: Goal: Adequate nutrition will be maintained Outcome: Not Progressing

## 2022-10-17 NOTE — Progress Notes (Signed)
Physical Therapy Treatment Patient Details Name: Joan Robinson MRN: [id number] DOB: 09-20-1960 Today's Date: 10/17/2022   History of Present Illness 62 yo admitted 9/28 with RUQ pain with SBO.  9/29 facial droop, head CT: left posteroinferior cerebellar artery territory infarction. 9/30 MRI: Large acute infarct (affecting both the left PICA and left AICA. She underwent R hemicolectomy 10/1. PMhx: HTN, HLD, peptic ulcer disease, diverticulitis, bipolar disorder, hypothyroidism, GERD, Hashimoto's disease.    PT Comments  Pt received in bed with c/o 8/10 pain. Pt very motivated and agreeable to participation in therapy. She required min assist bed mobility and demo poor sitting balance EOB. She performed LE exercises in supine and sitting. PT session focused on LE and core strengthening, and sitting tolerance. SpO2 stable on 5L. HR to 110s. Pt received IV pain meds during session resulting in increased lethargy. Pt returned to supine in bed. Bed placed in chair position. Pillows placed bilat trunk to assist with maintaining midline. Husband present in room and supportive. Current POC remains appropriate.     If plan is discharge home, recommend the following: A lot of help with walking and/or transfers;A lot of help with bathing/dressing/bathroom;Assistance with cooking/housework;Direct supervision/assist for medications management;Assist for transportation;Supervision due to cognitive status;Help with stairs or ramp for entrance;Direct supervision/assist for financial management   Can travel by private vehicle        Equipment Recommendations  None recommended by PT    Recommendations for Other Services       Precautions / Restrictions Precautions Precautions: Fall;Other (comment) Precaution Comments: watch sats, NG tube, BP <220/110, a line L wrist, NG tube     Mobility  Bed Mobility Overal bed mobility: Needs Assistance Bed Mobility: Supine to Sit, Sit to Sidelying, Rolling Rolling:  Contact guard assist   Supine to sit: Min assist   Sit to sidelying: Min assist General bed mobility comments: +rail, cues for sequencing, assist with trunk and scoot to EOB    Transfers                   General transfer comment: deferred progression OOB due to pain and lethargy (IV pain meds given during session)    Ambulation/Gait                   Stairs             Wheelchair Mobility     Tilt Bed    Modified Rankin (Stroke Patients Only) Modified Rankin (Stroke Patients Only) Pre-Morbid Rankin Score: No symptoms Modified Rankin: Severe disability     Balance Overall balance assessment: Needs assistance Sitting-balance support: Feet unsupported, Bilateral upper extremity supported Sitting balance-Leahy Scale: Poor   Postural control: Posterior lean, Left lateral lean                                  Cognition Arousal: Lethargic, Suspect due to medications Behavior During Therapy: Flat affect Overall Cognitive Status: Impaired/Different from baseline Area of Impairment: Attention, Following commands, Problem solving                   Current Attention Level: Sustained   Following Commands: Follows one step commands consistently     Problem Solving: Slow processing, Difficulty sequencing General Comments: lethargic but very motivated to participate in therapy        Exercises General Exercises - Lower Extremity Ankle Circles/Pumps: AROM, Both, 10 reps, Supine Long  Arc Quad: AROM, Right, Left, 10 reps, Seated Heel Slides: AROM, Right, Left, 10 reps, Supine Hip ABduction/ADduction: AROM, Both, 10 reps, Supine    General Comments General comments (skin integrity, edema, etc.): HR to 110s. SpO2 in 90s on 5L. BP stable      Pertinent Vitals/Pain Pain Assessment Pain Assessment: 0-10 Pain Score: 8  Pain Location: abdomen Pain Descriptors / Indicators: Operative site guarding, Grimacing, Sore Pain  Intervention(s): Limited activity within patient's tolerance, Monitored during session, RN gave pain meds during session, Repositioned    Home Living                          Prior Function            PT Goals (current goals can now be found in the care plan section) Acute Rehab PT Goals Patient Stated Goal: return home, garden Progress towards PT goals: Progressing toward goals    Frequency    Min 1X/week      PT Plan      Co-evaluation              AM-PAC PT "6 Clicks" Mobility   Outcome Measure  Help needed turning from your back to your side while in a flat bed without using bedrails?: A Little Help needed moving from lying on your back to sitting on the side of a flat bed without using bedrails?: A Little Help needed moving to and from a bed to a chair (including a wheelchair)?: A Lot Help needed standing up from a chair using your arms (e.g., wheelchair or bedside chair)?: A Lot Help needed to walk in hospital room?: Total Help needed climbing 3-5 steps with a railing? : Total 6 Click Score: 12    End of Session Equipment Utilized During Treatment: Oxygen Activity Tolerance: Patient limited by pain;Patient limited by lethargy Patient left: in bed;with bed alarm set;with call bell/phone within reach;with family/visitor present Nurse Communication: Mobility status PT Visit Diagnosis: Other abnormalities of gait and mobility (R26.89);Unsteadiness on feet (R26.81);Other symptoms and signs involving the nervous system (R29.898)     Time: 2951-8841 PT Time Calculation (min) (ACUTE ONLY): 28 min  Charges:    $Therapeutic Exercise: 8-22 mins $Therapeutic Activity: 8-22 mins PT General Charges $$ ACUTE PT VISIT: 1 Visit                     Ferd Glassing., PT  Office # [phone]    Ilda Foil 10/17/2022, 10:00 AM

## 2022-10-17 NOTE — Plan of Care (Signed)
  Problem: Education: Goal: Knowledge of General Education information will improve Description: Including pain rating scale, medication(s)/side effects and non-pharmacologic comfort measures 10/17/2022 0559 by Marlinda Mike, RN Outcome: Progressing 10/17/2022 0558 by Marlinda Mike, RN Outcome: Progressing   Problem: Activity: Goal: Risk for activity intolerance will decrease Outcome: Progressing   Problem: Coping: Goal: Level of anxiety will decrease Outcome: Progressing   Problem: Pain Managment: Goal: General experience of comfort will improve Outcome: Progressing   Problem: Safety: Goal: Ability to remain free from injury will improve Outcome: Progressing   Problem: Skin Integrity: Goal: Risk for impaired skin integrity will decrease Outcome: Progressing   Problem: Self-Care: Goal: Ability to communicate needs accurately will improve Outcome: Progressing

## 2022-10-17 NOTE — Anesthesia Postprocedure Evaluation (Signed)
Anesthesia Post Note  Patient: Joan Robinson  Procedure(s) Performed: LAPAROSCOPIC ASSISTED RIGHT HEMI COLECTOMY (Right)     Patient location during evaluation: PACU Anesthesia Type: General Level of consciousness: awake Pain management: pain level controlled Vital Signs Assessment: post-procedure vital signs reviewed and stable Respiratory status: spontaneous breathing, nonlabored ventilation, respiratory function stable and patient connected to nasal cannula oxygen Cardiovascular status: blood pressure returned to baseline and stable Postop Assessment: no apparent nausea or vomiting Anesthetic complications: no   No notable events documented.  Last Vitals:  Vitals:   10/17/22 0930 10/17/22 0945  BP:    Pulse: (!) 101 98  Resp: 20 11  Temp:    SpO2: 95% (!) 89%    Last Pain:  Vitals:   10/17/22 0830  TempSrc:   PainSc: 8                  Mariann Barter

## 2022-10-18 ENCOUNTER — Other Ambulatory Visit: Payer: Self-pay

## 2022-10-18 DIAGNOSIS — I639 Cerebral infarction, unspecified: Secondary | ICD-10-CM | POA: Diagnosis not present

## 2022-10-18 DIAGNOSIS — K56609 Unspecified intestinal obstruction, unspecified as to partial versus complete obstruction: Secondary | ICD-10-CM | POA: Diagnosis not present

## 2022-10-18 LAB — COMPREHENSIVE METABOLIC PANEL
ALT: 17 U/L (ref 0–44)
AST: 16 U/L (ref 15–41)
Albumin: 2.8 g/dL — ABNORMAL LOW (ref 3.5–5.0)
Alkaline Phosphatase: 56 U/L (ref 38–126)
Anion gap: 8 (ref 5–15)
BUN: 21 mg/dL (ref 8–23)
CO2: 24 mmol/L (ref 22–32)
Calcium: 8.5 mg/dL — ABNORMAL LOW (ref 8.9–10.3)
Chloride: 121 mmol/L — ABNORMAL HIGH (ref 98–111)
Creatinine, Ser: 0.89 mg/dL (ref 0.44–1.00)
GFR, Estimated: >60 mL/min (ref >60)
Glucose, Bld: 107 mg/dL — ABNORMAL HIGH (ref 70–99)
Potassium: 3.6 mmol/L (ref 3.5–5.1)
Sodium: 153 mmol/L — ABNORMAL HIGH (ref 135–145)
Total Bilirubin: 0.5 mg/dL (ref 0.3–1.2)
Total Protein: 6 g/dL — ABNORMAL LOW (ref 6.5–8.1)

## 2022-10-18 LAB — GLUCOSE, CAPILLARY
Glucose-Capillary: 97 mg/dL (ref 70–99)
Glucose-Capillary: 94 mg/dL (ref 70–99)
Glucose-Capillary: 86 mg/dL (ref 70–99)
Glucose-Capillary: 68 mg/dL — ABNORMAL LOW (ref 70–99)
Glucose-Capillary: 128 mg/dL — ABNORMAL HIGH (ref 70–99)

## 2022-10-18 LAB — CBC
HCT: 34 % — ABNORMAL LOW (ref 36.0–46.0)
Hemoglobin: 10.8 g/dL — ABNORMAL LOW (ref 12.0–15.0)
MCH: 29.4 pg (ref 26.0–34.0)
MCHC: 31.8 g/dL (ref 30.0–36.0)
MCV: 92.6 fL (ref 80.0–100.0)
Platelets: 165 10*3/uL (ref 150–400)
RBC: 3.67 MIL/uL — ABNORMAL LOW (ref 3.87–5.11)
RDW: 13.6 % (ref 11.5–15.5)
WBC: 6.7 10*3/uL (ref 4.0–10.5)
nRBC: 0.3 % — ABNORMAL HIGH (ref 0.0–0.2)

## 2022-10-18 LAB — SODIUM
Sodium: 153 mmol/L — ABNORMAL HIGH (ref 135–145)
Sodium: 151 mmol/L — ABNORMAL HIGH (ref 135–145)

## 2022-10-18 MED ORDER — POLYVINYL ALCOHOL 1.4 % OP SOLN
1.0000 [drp] | Freq: Four times a day (QID) | OPHTHALMIC | Status: DC
  Administered 2022-10-18 – 2022-10-22 (×14): 1 [drp] via OPHTHALMIC
  Filled 2022-10-18: qty 15

## 2022-10-18 MED ORDER — ACETAMINOPHEN 10 MG/ML IV SOLN
1000.0000 mg | Freq: Four times a day (QID) | INTRAVENOUS | Status: AC
  Administered 2022-10-18 – 2022-10-19 (×4): 1000 mg via INTRAVENOUS
  Filled 2022-10-18 (×4): qty 100

## 2022-10-18 MED ORDER — DEXTROSE 50 % IV SOLN
12.5000 g | INTRAVENOUS | Status: AC
  Administered 2022-10-18: 12.5 g via INTRAVENOUS

## 2022-10-18 NOTE — Progress Notes (Signed)
Occupational Therapy Treatment Patient Details Name: Joan Robinson MRN: [id number] DOB: 05-24-1960 Today's Date: 10/18/2022   History of present illness 62 yo admitted 9/28 with RUQ pain with SBO.  9/29 facial droop, head CT: left posteroinferior cerebellar artery territory infarction. 9/30 MRI: Large acute infarct affecting both the left PICA and left AICA. 10/1 Rt hemicolectomy. PMhx: HTN, HLD, peptic ulcer disease, diverticulitis, bipolar disorder, hypothyroidism, GERD, Hashimoto's disease.   OT comments  Pt with lethargy in supine, became alert and puts forth excellent effort once seated at EOB. Pt is aware her vision is impaired, asking for her glasses. Pt with dysconjugate gaze, tends to gaze upward and to L. With multimodal cues and min assist able to sit EOB. Continues to demonstrate L lean in sitting and can correct with verbal cues. Pt ambulated with +2 mod assist and sister following with chair. Moderate L side lean, increases with fatigue. Pt continues to be an excellent rehab candidate. Patient will benefit from intensive inpatient follow up therapy, >3 hours/day.      If plan is discharge home, recommend the following:  Two people to help with walking and/or transfers;A lot of help with bathing/dressing/bathroom;Assistance with cooking/housework;Assistance with feeding;Direct supervision/assist for financial management;Direct supervision/assist for medications management;Assist for transportation;Help with stairs or ramp for entrance   Equipment Recommendations  Other (comment) (defer)    Recommendations for Other Services      Precautions / Restrictions Precautions Precautions: Fall;Other (comment) Precaution Comments: watch sats, NG tube, BP <220/110, NG tube Restrictions Weight Bearing Restrictions: No       Mobility Bed Mobility Overal bed mobility: Needs Assistance Bed Mobility: Rolling, Sidelying to Sit Rolling: Min assist Sidelying to sit: Min assist        General bed mobility comments: increased time, cues for log roll technique to minimize abdominal pain, assist for L UE across midline to reach rail, assist to raise trunk, pt up to R side of bed    Transfers Overall transfer level: Needs assistance Equipment used: Rolling walker (2 wheels) Transfers: Sit to/from Stand Sit to Stand: +2 physical assistance, Mod assist           General transfer comment: assist to rise and steady, pt with  moderate L side lean, seemingly unaware     Balance Overall balance assessment: Needs assistance Sitting-balance support: Feet unsupported, Bilateral upper extremity supported Sitting balance-Leahy Scale: Poor Sitting balance - Comments: reliant on B UE support and cues for midline orientation, able to correct posture with cues Postural control: Left lateral lean Standing balance support: Bilateral upper extremity supported Standing balance-Leahy Scale: Poor Standing balance comment: B UE and assist to maintain midline                           ADL either performed or assessed with clinical judgement   ADL                                              Extremity/Trunk Assessment Upper Extremity Assessment LUE Deficits / Details: 3+/5 LUE Sensation: decreased proprioception;decreased light touch LUE Coordination: decreased fine motor;decreased gross motor            Vision   Vision Assessment?: Yes Tracking/Visual Pursuits: Right eye does not track medially;Left eye does not track laterally Visual Fields: Left visual field deficit   Perception  Perception Perception: Impaired   Praxis      Cognition Arousal: Alert Behavior During Therapy: Flat affect (smiled x 1) Overall Cognitive Status: Impaired/Different from baseline Area of Impairment: Attention, Following commands, Problem solving, Safety/judgement                   Current Attention Level: Sustained   Following Commands: Follows one  step commands consistently, Follows one step commands with increased time Safety/Judgement: Decreased awareness of safety, Decreased awareness of deficits   Problem Solving: Difficulty sequencing, Requires tactile cues, Requires verbal cues General Comments: lethargy intially, becomes more alert with activity        Exercises      Shoulder Instructions       General Comments      Pertinent Vitals/ Pain       Pain Assessment Pain Assessment: Faces Faces Pain Scale: No hurt  Home Living     Available Help at Discharge: Family;Available 24 hours/day Type of Home: House                              Lives With: Spouse;Family (grandson)    Prior Functioning/Environment              Frequency  Min 1X/week        Progress Toward Goals  OT Goals(current goals can now be found in the care plan section)  Progress towards OT goals: Progressing toward goals  Acute Rehab OT Goals OT Goal Formulation: With patient Time For Goal Achievement: 10/29/22 Potential to Achieve Goals: Good  Plan      Co-evaluation    PT/OT/SLP Co-Evaluation/Treatment: Yes Reason for Co-Treatment: Complexity of the patient's impairments (multi-system involvement);For patient/therapist safety   OT goals addressed during session: Strengthening/ROM      AM-PAC OT "6 Clicks" Daily Activity     Outcome Measure   Help from another person eating meals?: Total Help from another person taking care of personal grooming?: A Little Help from another person toileting, which includes using toliet, bedpan, or urinal?: A Lot Help from another person bathing (including washing, rinsing, drying)?: A Lot Help from another person to put on and taking off regular upper body clothing?: A Lot Help from another person to put on and taking off regular lower body clothing?: A Lot 6 Click Score: 12    End of Session Equipment Utilized During Treatment: Gait belt;Rolling walker (2 wheels);Oxygen  (6L)  OT Visit Diagnosis: Unsteadiness on feet (R26.81);Other abnormalities of gait and mobility (R26.89);Muscle weakness (generalized) (M62.81);Low vision, both eyes (H54.2);Other symptoms and signs involving cognitive function   Activity Tolerance Patient tolerated treatment well   Patient Left in chair;with call bell/phone within reach;with chair alarm set;with family/visitor present   Nurse Communication Mobility status        Time: 1610-9604 OT Time Calculation (min): 30 min  Charges: OT General Charges $OT Visit: 1 Visit OT Treatments $Neuromuscular Re-education: 8-22 mins  Berna Spare, OTR/L Acute Rehabilitation Services Office: [phone]   Evern Bio 10/18/2022, 12:56 PM

## 2022-10-18 NOTE — Progress Notes (Signed)
STROKE TEAM PROGRESS NOTE   INTERVAL HISTORY Her sister is at bedside.  Patient sitting in chair, working with speech. She did not pass swallow today, will continue IVF. However, pt has passed gas and surgery team is OK for her to have full liquid diet. She still has left facial droop, left gaze deficit and left ataxia. CT repeat yesterday stable. Na 153 today, now on NS.   Vitals:   10/18/22 1000 10/18/22 1100 10/18/22 1159 10/18/22 1200  BP: 135/82 (!) 144/67  (!) 151/82  Pulse: (!) 101 (!) 59  79  Resp: 14 15  (!) 26  Temp:   98.6 F (37 C)   TempSrc:   Axillary   SpO2: (!) 88% 100%  100%  Weight:      Height:       CBC:  Recent Labs  Lab 10/17/22 0629 10/18/22 0957  WBC 7.4 6.7  HGB 12.5 10.8*  HCT 37.7 34.0*  MCV 92.2 92.6  PLT 188 165   Basic Metabolic Panel:  Recent Labs  Lab 10/17/22 2300 10/18/22 0957  NA 156* 153*  K 4.0 3.6  CL 126* 121*  CO2 25 24  GLUCOSE 119* 107*  BUN 21 21  CREATININE 0.88 0.89  CALCIUM 8.0* 8.5*   Lipid Panel:  Recent Labs  Lab 10/15/22 0228  CHOL 142  TRIG 60  HDL 76  CHOLHDL 1.9  VLDL 12  LDLCALC 54   HgbA1c:  Recent Labs  Lab 10/15/22 0228  HGBA1C 5.6    IMAGING past 24 hours No results found.  PHYSICAL EXAM Physical Exam  Constitutional: Appears well-developed and well-nourished.  Psych: Affect appropriate to situation Eyes: Normal external eye and conjunctiva. HENT: Normocephalic, no lesions, without obvious abnormality.   Musculoskeletal-no joint tenderness, deformity or swelling Cardiovascular: Normal rate and regular rhythm.  Respiratory: Effort normal, non-labored breathing saturations WNL GI: Soft.  No distension. There is no tenderness.  Skin: WDI   Neuro:  Pt lethargic, but awake alert, eyes open spontaneously, orientated to age, place, time and people. No aphasia, but paucity of speech, answer questions with short words, following all simple commands. Able to name and repeat in dysarthric  voice. Bilateral left gaze palsy and eyes disconjugate with left eye mildly upward position, and decreased left eye upward and downward movement, visual field full. Left peripheral facial droop with decreased eye blinking on the left. Tongue midline. Muscle strength bilaterally symmetrical. Significant left FTN and HTS ataxia. Sensation symmetrical bilaterally, gait not tested.     ASSESSMENT/PLAN Satori Krabill Szczesniak is a 62 y.o. female with past medical history of esophageal strictures status post dilation, multiple hernias status post multiple surgeries, GERD/PUD, hypertension, hyperlipidemia, autoimmune urticaria, emphysema, sleep apnea who presents to the ED with right upper quadrant and right lower quadrant abdominal pain x 2 days associated nausea.  She was found to have small bowel obstruction and was being managed inpatient at [facility].  Then, noted to have left facial droop, slurring of her speech, and CT head without contrast which was notable for a moderately large left PICA territory infarct. CT angio head and neck with and without contrast was negative for any large vessel occlusion.  Specifically, no basilar occlusion or thrombosis noted.  CT perfusion with no core or mismatch noted.  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 Confluent cytotoxic edema but no hemorrhagic transformation, no interval extension,  and no significant posterior fossa mass effect. CTA head & neck left AICA not visualized Ct perfusion: no core MRI Large acute infarct (affecting both the left PICA and left AICA vascular territories). Posterior fossa mass effect with effacement of the fourth ventricle inferiorly. No evidence of obstructive hydrocephalus at this time.  CT repeat 10/2 showed unchanged appearance of left cerebellar infarct.  No acute hemorrhage. 2D Echo EF 60-65% LDL  54 HgbA1c 5.6 VTE prophylaxis - heparin subcutaneous  No antithrombotic prior to admission, not on antithrombotics now. Pt allergic to ASA and avoid plavix now given potential EVD drain. Therapy recommendations:  CIR Disposition:  pending  Cerebellar edema Mild mass effect to compress on the brainstem Risk of developing hydrocephalus On 3% saline @75 ->NS @75  Need close clinical and imaging monitoring CT repeat 10/2 stable infarct, no hemorrhage, no hydrocephalus Sodium [id number] Sodium check every 6 hours  SBO Pt admitted for abdominal pain x 2 days with N/V Found to have SBO, may related to her previous multiple surgeries for hernia Surgery on board Now NPO with NG suction KUB - Dilated loops of large bowel seen in the right upper quadrant and multiple dilated loops of small bowel, 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 CT abd/pelvis progression of SBO Status post surgery treatment 10/1 - on wall suctioning  Hypertension Home meds:  none Stable Long-term BP goal normotensive  Hyperlipidemia Home meds:  crestor 20mg   LDL 54, goal < 70 Resume home crestor 20 once po access Continue statin at discharge  Other Stroke Risk Factors Sleep apnea, not on CPAP at home  Other Active Problems Esophageal stricture s/p dilation  PUD multiple hernias status post multiple surgeries  GERD Autoimmune urticaria  Emphysema  Hypokalemia, supplemented  Hospital day # 4  This patient is critically ill due to large cerebellar infarct, cerebellar edema, SBO and at significant risk of neurological worsening, death form hydrocephalus, brain herniation, acute abdominal syndrome. This patient's care requires constant monitoring of vital signs, hemodynamics, respiratory and cardiac monitoring, review of multiple databases, neurological assessment, discussion with family, other specialists and medical decision making of high  complexity. I spent 35 minutes of neurocritical care time in the care of this patient. I had long discussion with sister at bedside, updated pt current condition, treatment plan and potential prognosis, and answered all the questions. She expressed understanding and appreciation. I also discussed with surgical PA.    Marvel Plan, MD PhD Stroke Neurology 10/18/2022 12:27 PM       To contact Stroke Continuity provider, please refer to [email or web address]. After hours, contact General Neurology

## 2022-10-18 NOTE — Plan of Care (Signed)

## 2022-10-18 NOTE — Progress Notes (Addendum)
Patient ID: Joan Robinson, female   DOB: 16-Jun-1960, 62 y.o.   MRN: [id number] [facility] Surgery Progress Note  2 Days Post-Op  Subjective: CC-  Up in chair. Abdominal pain well controlled. Denies n/v currently. Passing flatus, no BM. Low NG output, 150cc last 24 hr.  Objective: Vital signs in last 24 hours: Temp:  [98.1 F (36.7 C)-98.9 F (37.2 C)] 98.9 F (37.2 C) (10/03 0653) Pulse Rate:  [60-111] 101 (10/03 1000) Resp:  [8-24] 14 (10/03 1000) BP: (130-163)/(66-94) 135/82 (10/03 1000) SpO2:  [88 %-100 %] 88 % (10/03 1000) Arterial Line BP: (142-165)/(65-80) 142/65 (10/02 1400) Weight:  [73.5 kg] 73.5 kg (10/03 0352) Last BM Date :  (PTA)  Intake/Output from previous day: 10/02 0701 - 10/03 0700 In: 3323.6 [I.V.:1838.6; NG/GT:420; IV Piggyback:1065.1] Out: 2075 [Urine:1925; Emesis/NG output:150] Intake/Output this shift: Total I/O In: 273.9 [I.V.:219; IV Piggyback:55] Out: 275 [Urine:275]  PE: General: Alert, less lethargic, NAD Heart: RRR Lungs: Respiratory effort nonlabored on HFNC Abd: soft, mild distension, few bowel sounds heard, appropriately tender over incision, honeycomb to midline with some dried blood at inferior aspect  Lab Results:  Recent Labs    10/17/22 0629 10/18/22 0957  WBC 7.4 6.7  HGB 12.5 10.8*  HCT 37.7 34.0*  PLT 188 165   BMET Recent Labs    10/17/22 0629 10/17/22 1128 10/17/22 1715 10/17/22 2300  NA 150*   < > 154* 156*  K 3.1*  --   --  4.0  CL 118*  --   --  126*  CO2 23  --   --  25  GLUCOSE 170*  --   --  119*  BUN 13  --   --  21  CREATININE 0.79  --   --  0.88  CALCIUM 8.4*  --   --  8.0*   < > = values in this interval not displayed.   PT/INR No results for input(s): "LABPROT", "INR" in the last 72 hours. CMP     Component Value Date/Time   NA 156 (H) 10/17/2022 2300   K 4.0 10/17/2022 2300   CL 126 (H) 10/17/2022 2300   CO2 25 10/17/2022 2300   GLUCOSE 119 (H) 10/17/2022 2300   BUN 21 10/17/2022 2300    CREATININE 0.88 10/17/2022 2300   CREATININE 0.91 09/02/2015 1352   CALCIUM 8.0 (L) 10/17/2022 2300   PROT 6.2 (L) 10/17/2022 0629   ALBUMIN 3.0 (L) 10/17/2022 0629   AST 18 10/17/2022 0629   ALT 16 10/17/2022 0629   ALKPHOS 60 10/17/2022 0629   BILITOT 0.8 10/17/2022 0629   GFRNONAA >60 10/17/2022 2300   GFRAA  12/16/2009 2144    >60        The eGFR has been calculated using the MDRD equation. This calculation has not been validated in all clinical situations. eGFR's persistently <60 mL/min signify possible Chronic Kidney Disease.   Lipase     Component Value Date/Time   LIPASE 10 (L) 10/13/2022 2116       Studies/Results: CT HEAD WO CONTRAST ( )  Result Date: 10/17/2022 CLINICAL DATA:  Stroke follow-up EXAM: CT HEAD WITHOUT CONTRAST TECHNIQUE: Contiguous axial images were obtained from the base of the skull through the vertex without intravenous contrast. RADIATION DOSE REDUCTION: This exam was performed according to the departmental dose-optimization program which includes automated exposure control, adjustment of the mA and/or kV according to patient size and/or use of iterative reconstruction technique. COMPARISON:  10/15/2022 FINDINGS: Brain: Unchanged distribution of  hypoattenuation within the left cerebellar hemisphere. No acute hemorrhage. No hydrocephalus. Vascular: Negative Skull: Negative Sinuses/Orbits: Partial opacification of the right ethmoid air cells. Normal orbits. Ocular lens replacements. Other: None IMPRESSION: Unchanged appearance of left cerebellar infarct. No acute hemorrhage. Electronically Signed   By: [name] M.D.   On: 10/17/2022 03:35   DG CHEST PORT 1 VIEW  Result Date: 10/17/2022 CLINICAL DATA:  Respiratory failure EXAM: PORTABLE CHEST 1 VIEW COMPARISON:  10/15/2022 FINDINGS: Gastric catheter is noted within the stomach. Cardiac shadow is stable. Small effusions are again seen bilaterally with basilar atelectasis. The overall appearance  is stable from the prior study. IMPRESSION: Small effusions and basilar atelectasis. Electronically Signed   By: Alcide Clever M.D.   On: 10/17/2022 00:00    Anti-infectives: Anti-infectives (From admission, onward)    Start     Dose/Rate Route Frequency Ordered Stop   10/16/22 2200  cefoTEtan (CEFOTAN) 2 g in sodium chloride 0.9 % 100 mL IVPB        2 g 200 mL/hr over 30 Minutes Intravenous Every 12 hours 10/16/22 1524 10/17/22 1113   10/16/22 0930  cefoTEtan (CEFOTAN) 2 g in sodium chloride 0.9 % 100 mL IVPB        2 g 200 mL/hr over 30 Minutes Intravenous On call to O.R. 10/16/22 0844 10/17/22 0659        Assessment/Plan Cecal volvulus  POD#2 s/p Laparoscopic converted to open right hemicolectomy 10/1 Dr. Cliffton Asters - surgical path pending - Clamp NG, if patient complains of worsening abdominal pain, bloating, n/v then return NG to LIWS. Will ask SLP to see for swallow eval - if she passes would be ok for clear liquids from our standpoint - mobilize, therapies - recommending CIR - continue IV tylenol and IV robaxin   Acute stroke - Hgb 10.8 from 12.5. would like to hold plavix one more day, but will reach out to neurology - if critical that it gets restarted today I think that would be ok.   FEN: clamp NG, NPO until SLP eval VTE: sq heparin ID: cefotetan 10/1>>10/2 Foley: ok to d/c foley from surgical standpoint    LOS: 4 days    Franne Forts, [facility] Surgery 10/18/2022, 10:30 AM Please see Amion for pager number during day hours 7:00am-4:30pm

## 2022-10-18 NOTE — Evaluation (Signed)
Speech Language Pathology Evaluation Patient Details Name: Joan Robinson MRN: [id number] DOB: Oct 20, 1960 Today's Date: 10/18/2022 Time: 1209-1221 SLP Time Calculation (min) (ACUTE ONLY): 12 min  Problem List:  Patient Active Problem List   Diagnosis Date Noted   Diverticulosis 10/14/2022   History of emphysema (HCC) 10/14/2022   SBO (small bowel obstruction) (HCC) 10/13/2022   Aortic valve calcification 09/20/2022   Abdominal aortic atherosclerosis (HCC) 09/20/2022   Hiatal hernia 07/12/2021   Status post laparoscopic Nissen fundoplication 05/01/2021   IBS (irritable bowel syndrome) 07/27/2016   High-tone pelvic floor dysfunction 05/09/2016   Midline cystocele 05/09/2016   Vaginal atrophy 05/09/2016   Bladder prolapse, female, acquired 05/07/2016   Dystrophic nail 04/29/2016   Fatigue 04/27/2016   Sleep apnea 04/27/2016   UTI (urinary tract infection) 09/11/2015   Cataract cortical, senile, left 12/20/2014   Rectal lesion 10/10/2014   Other type of migraine without status migrainosus 05/06/2014   Dyspepsia 05/06/2014   Postmenopausal estrogen deficiency 05/06/2014   Screening for breast cancer 05/06/2014   Bilateral low-tension glaucoma, indeterminate stage 12/29/2013   High myopia, bilateral 12/29/2013   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   Obesity 11/01/2013   Eustachian tube dysfunction 10/08/2013   Otalgia of left ear 10/08/2013   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   Arthritis 07/24/2013   Anxiety attack 03/08/2013   Paresthesia 12/22/2012   Headache 08/17/2012   BCC (basal cell carcinoma of skin) 06/01/2012   Left arm pain 05/10/2012   Right knee pain 05/10/2012   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   ADD (attention deficit disorder) 04/13/2012   Perimenopausal 01/16/2012   Macular pucker of both eyes 10/18/2011   Vitreous degeneration 10/18/2011   Chronic anemia 10/10/2011   Preventative health care 10/10/2011    Urticaria, chronic 10/10/2011   Macular pucker, bilateral 10/10/2011   Lower back pain    DIVERTICULITIS-COLON 07/25/2009   Diverticulitis of colon 07/25/2009   Hyperlipidemia 06/09/2009   Essential hypertension 06/09/2009   History of esophageal stricture 09/06/2008   GERD (gastroesophageal reflux disease) 07/27/2008   Bipolar disorder (HCC) 01/17/2007   Hypothyroidism 08/24/2006   Allergic rhinitis 08/24/2006   URINARY INCONTINENCE 08/24/2006   Past Medical History:  Past Medical History:  Diagnosis Date   Allergic rhinitis    Anemia 10/10/2011   Anxiety and depression 01/17/2007   Qualifier: Diagnosis of  By: Everardo All MD, Sean A    Arthritis 07/24/2013   Likely inflammatory and following with Dr Maryln Gottron of [facility]  rheumatology   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   BCC (basal cell carcinoma of skin) 06/01/2012   Leg Follows with Dr Margo Aye   Bipolar disorder (HCC) 01/17/2007   Qualifier: Diagnosis of  By: Everardo All MD, Gregary Signs A    Cataract    Diverticulosis    Diverticulosis    Emphysema of lung (HCC)    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   Gallstones    GERD (gastroesophageal reflux disease)    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   Hashimoto's disease    Hyperlipidemia    Hypertension    Hypothyroidism 08/24/2006   Qualifier: Diagnosis of  By: Everardo All MD, Sean A     IBS (irritable bowel syndrome) 07/27/2016   Obesity 11/01/2013   PUD (peptic ulcer disease)    Sleep apnea 04/27/2016   Tobacco abuse    Past Surgical History:  Past Surgical History:  Procedure Laterality Date   ABDOMINAL HYSTERECTOMY     ABDOMINAL HYSTERECTOMY  01/15/2009   complete  COLONOSCOPY     DILATION AND CURETTAGE OF UTERUS  01/16/1983   EYE SURGERY  03/03/2014   Surgery on both eyes for epiretinal membrane (vitreous peel)   Gated Spect wall motion stress cardiolite  11/05/2001   HIATAL HERNIA REPAIR N/A 07/12/2021   Procedure: LAPAROSCOPY W/ EXTENSIVE FOREGUT DISSECTION; PARTIAL STOMACH REDUCTION;  GASTROSTOMY TUBE PLACEMENT; GASTROPEXY;  Surgeon: Luretha Murphy, MD;  Location: [facility];  Service: General;  Laterality: N/A;   LAPAROSCOPIC RIGHT HEMI COLECTOMY Right 10/16/2022   Procedure: LAPAROSCOPIC ASSISTED RIGHT HEMI COLECTOMY;  Surgeon: Andria Meuse, MD;  Location: [facility] OR;  Service: General;  Laterality: Right;   POLYPECTOMY     TUBAL LIGATION  01/15/1993   UTERINE SUSPENSION     mesh   VITRECTOMY Bilateral 03/03/2014   XI ROBOTIC ASSISTED HIATAL HERNIA REPAIR N/A 05/01/2021   Procedure: XI ROBOTIC ASSISTED TYPE III HIATAL HERNIA REPAIR WITH FUNDOPLICATION;  Surgeon: Luretha Murphy, MD;  Location: [facility];  Service: General;  Laterality: N/A;   HPI:  62 yo admitted 9/28 with RUQ pain with SBO.  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 PMhx: HTN, HLD, peptic ulcer disease, diverticulitis, bipolar disorder, hypothyroidism, GERD, Hashimoto's disease. Hemicolectomy completed on 10/1.   Assessment / Plan / Recommendation Clinical Impression  Pt was seen for a cognitive-linguistic evaluation.  Pt was lethargic throughout this evaluation; therefore evaluation was abbreviated.  [name] reported that she lives with her husband and grandson (age 59) and that she is responsible for all IADLs for the family at baseline, but that they could assist her at time of discharge in addition to other family members.  Pt was oriented x4.  She exhibited deficits in sustained attention, but was able to complete focused attention, basic numeric problem solving, and confrontational naming tasks without difficulty.  Pt followed 1 step commands intermittently and benefited from additional processing time.  Speech intelligibility was mildly reduced secondary to low vocal intensity.  Pt exhibited a flat affect throughout this evaluation.  Recommend continued ST acutely and at time of discharge in addition to assistance with IADLs.    SLP  Assessment  SLP Recommendation/Assessment: Patient needs continued Speech Lanaguage Pathology Services SLP Visit Diagnosis: Cognitive communication deficit (R41.841)    Recommendations for follow up therapy are one component of a multi-disciplinary discharge planning process, led by the attending physician.  Recommendations may be updated based on patient status, additional functional criteria and insurance authorization.    Follow Up Recommendations  Skilled nursing-short term rehab (<3 hours/day)    Assistance Recommended at Discharge  Intermittent Supervision/Assistance  Functional Status Assessment Patient has had a recent decline in their functional status and demonstrates the ability to make significant improvements in function in a reasonable and predictable amount of time.  Frequency and Duration min 2x/week  2 weeks      SLP Evaluation Cognition  Overall Cognitive Status: Impaired/Different from baseline Arousal/Alertness: Awake/alert Orientation Level: Oriented X4 Attention: Sustained;Focused Focused Attention: Appears intact Sustained Attention: Impaired Sustained Attention Impairment: Verbal basic Problem Solving: Appears intact (for basic questions)       Comprehension  Auditory Comprehension Commands: Impaired One Step Basic Commands: 75-100% accurate Conversation: Simple Interfering Components: Attention EffectiveTechniques: Extra processing time    Expression Expression Primary Mode of Expression: Verbal Verbal Expression Overall Verbal Expression: Appears within functional limits for tasks assessed   Oral / Motor  Oral Motor/Sensory Function Overall Oral Motor/Sensory Function: Moderate impairment Facial ROM: Reduced left Facial  Symmetry: Abnormal symmetry left;Suspected CN VII (facial) dysfunction Lingual Strength: Reduced Motor Speech Overall Motor Speech: Impaired Respiration: Within functional limits Phonation: Low vocal intensity Resonance:  Within functional limits Articulation: Within functional limitis Intelligibility: Intelligibility reduced Word: 75-100% accurate Phrase: 75-100% accurate Motor Planning: Witnin functional limits           Eino Farber, M.S., CCC-SLP Acute Rehabilitation Services Office: [phone]  Shanon Rosser [facility] 10/18/2022, 12:23 PM

## 2022-10-18 NOTE — Progress Notes (Signed)
Physical Therapy Treatment Patient Details Name: Joan Robinson MRN: [id number] DOB: 1960-08-06 Today's Date: 10/18/2022   History of Present Illness 62 yo admitted 9/28 with RUQ pain with SBO.  9/29 facial droop, head CT: left posteroinferior cerebellar artery territory infarction. 9/30 MRI: Large acute infarct affecting both the left PICA and left AICA. 10/1 Rt hemicolectomy. PMhx: HTN, HLD, peptic ulcer disease, diverticulitis, bipolar disorder, hypothyroidism, GERD, Hashimoto's disease.    PT Comments  Pt initially lethargic but able to respond even in supine and agreeable to mobility. Pt with increased alertness and eyes open sitting with blurry vision, tendency for looking up and decreased awareness. Pt continues to demonstrate good strength LLE with impaired coordination and function. Left lean in sitting with increased lean in standing requiring mod assist for stepping and gait initiation. Pt very motivated and stating "I won't give up" , supportive sisters present throughout session. Patient will benefit from intensive inpatient follow up therapy, >3 hours/day    If plan is discharge home, recommend the following: A lot of help with walking and/or transfers;A lot of help with bathing/dressing/bathroom;Assistance with cooking/housework;Direct supervision/assist for medications management;Assist for transportation;Supervision due to cognitive status;Help with stairs or ramp for entrance;Direct supervision/assist for financial management   Can travel by private vehicle        Equipment Recommendations  None recommended by PT    Recommendations for Other Services       Precautions / Restrictions Precautions Precautions: Fall;Other (comment) Precaution Comments: watch sats, NG tube, BP <220/110, NG tube Restrictions Weight Bearing Restrictions: No     Mobility  Bed Mobility Overal bed mobility: Needs Assistance Bed Mobility: Rolling, Sidelying to Sit Rolling: Min  assist Sidelying to sit: Min assist       General bed mobility comments: increased time, cues for log roll technique to minimize abdominal pain, assist for L UE across midline to reach rail, assist to raise trunk and clear legs, pt up to Rt side of bed    Transfers Overall transfer level: Needs assistance Equipment used: Rolling walker (2 wheels) Transfers: Sit to/from Stand Sit to Stand: +2 physical assistance, Mod assist           General transfer comment: assist to rise and steady from bed and from recliner, pt with moderate L side lean, seemingly unaware. mod cues for hand placement and sequence.    Ambulation/Gait Ambulation/Gait assistance: Mod assist, +2 physical assistance Gait Distance (Feet): 8 Feet Assistive device: Rolling walker (2 wheels) Gait Pattern/deviations: Step-to pattern, Trunk flexed   Gait velocity interpretation: <1.31 ft/sec, indicative of household ambulator   General Gait Details: pt with mod left lean with UB support on RW. +2 mod assist for trunk support, managing RW and IV with sister pushing chair behind. Pt with increased left lean with fatigue and needs cues to sit as pt unaware. difficulty with left foot coordination and advancement at times   Stairs             Wheelchair Mobility     Tilt Bed    Modified Rankin (Stroke Patients Only) Modified Rankin (Stroke Patients Only) Pre-Morbid Rankin Score: No symptoms Modified Rankin: Severe disability     Balance Overall balance assessment: Needs assistance Sitting-balance support: Feet unsupported, Bilateral upper extremity supported Sitting balance-Leahy Scale: Poor Sitting balance - Comments: reliant on B UE support and cues for midline orientation, able to correct posture with cues Postural control: Left lateral lean Standing balance support: Bilateral upper extremity supported, During functional activity, Reliant  on assistive device for balance Standing balance-Leahy Scale:  Poor Standing balance comment: UB support on RW                            Cognition Arousal: Alert Behavior During Therapy: Flat affect Overall Cognitive Status: Impaired/Different from baseline Area of Impairment: Attention, Following commands, Problem solving, Safety/judgement                   Current Attention Level: Sustained   Following Commands: Follows one step commands consistently, Follows one step commands with increased time Safety/Judgement: Decreased awareness of safety, Decreased awareness of deficits   Problem Solving: Difficulty sequencing, Requires tactile cues, Requires verbal cues General Comments: lethargy intially, becomes more alert with activity and upright positioning        Exercises      General Comments        Pertinent Vitals/Pain Pain Assessment Pain Assessment: Faces Faces Pain Scale: No hurt    Home Living     Available Help at Discharge: Family;Available 24 hours/day Type of Home: House                  Prior Function            PT Goals (current goals can now be found in the care plan section) Progress towards PT goals: Progressing toward goals    Frequency    Min 1X/week      PT Plan      Co-evaluation PT/OT/SLP Co-Evaluation/Treatment: Yes Reason for Co-Treatment: Complexity of the patient's impairments (multi-system involvement);For patient/therapist safety PT goals addressed during session: Mobility/safety with mobility;Proper use of DME;Balance OT goals addressed during session: Strengthening/ROM      AM-PAC PT "6 Clicks" Mobility   Outcome Measure  Help needed turning from your back to your side while in a flat bed without using bedrails?: A Little Help needed moving from lying on your back to sitting on the side of a flat bed without using bedrails?: A Little Help needed moving to and from a bed to a chair (including a wheelchair)?: A Lot Help needed standing up from a chair using  your arms (e.g., wheelchair or bedside chair)?: A Lot Help needed to walk in hospital room?: Total Help needed climbing 3-5 steps with a railing? : Total 6 Click Score: 12    End of Session Equipment Utilized During Treatment: Oxygen;Gait belt Activity Tolerance: Patient tolerated treatment well Patient left: in chair;with call bell/phone within reach;with chair alarm set;with family/visitor present Nurse Communication: Mobility status PT Visit Diagnosis: Other abnormalities of gait and mobility (R26.89);Unsteadiness on feet (R26.81);Other symptoms and signs involving the nervous system (R29.898)     Time: 1308-6578 PT Time Calculation (min) (ACUTE ONLY): 34 min  Charges:    $Gait Training: 8-22 mins PT General Charges $$ ACUTE PT VISIT: 1 Visit                     Merryl Hacker, PT Acute Rehabilitation Services Office: [phone]    Enedina Finner Merced Brougham 10/18/2022, 1:38 PM

## 2022-10-18 NOTE — Evaluation (Addendum)
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   BCC (basal cell carcinoma of skin) 06/01/2012   Leg Follows with Dr Margo Aye   Bipolar disorder (HCC) 01/17/2007   Qualifier: Diagnosis of  By: Everardo All MD, Sean A    Cataract    Diverticulosis    Diverticulosis    Emphysema of lung (HCC)    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   Gallstones    GERD (gastroesophageal reflux disease)    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   Hashimoto's disease    Hyperlipidemia    Hypertension    Hypothyroidism 08/24/2006   Qualifier: Diagnosis of  By: Everardo All MD, Sean A     IBS (irritable bowel syndrome) 07/27/2016   Obesity 11/01/2013   PUD (peptic ulcer disease)    Sleep apnea 04/27/2016   Tobacco abuse    Past Surgical History:  Past Surgical History:  Procedure Laterality Date   ABDOMINAL HYSTERECTOMY     ABDOMINAL HYSTERECTOMY  01/15/2009   complete   COLONOSCOPY     DILATION AND CURETTAGE OF UTERUS  01/16/1983   EYE SURGERY  03/03/2014   Surgery on both eyes for epiretinal membrane (vitreous peel)   Gated Spect wall motion stress cardiolite  11/05/2001   HIATAL HERNIA REPAIR N/A 07/12/2021   Procedure: LAPAROSCOPY W/ EXTENSIVE FOREGUT DISSECTION; PARTIAL STOMACH REDUCTION; GASTROSTOMY TUBE PLACEMENT; GASTROPEXY;  Surgeon: Luretha Murphy, MD;  Location: [facility];  Service: General;  Laterality: N/A;   LAPAROSCOPIC RIGHT HEMI COLECTOMY Right 10/16/2022   Procedure:  LAPAROSCOPIC ASSISTED RIGHT HEMI COLECTOMY;  Surgeon: Andria Meuse, MD;  Location: [facility] OR;  Service: General;  Laterality: Right;   POLYPECTOMY     TUBAL LIGATION  01/15/1993   UTERINE SUSPENSION     mesh   VITRECTOMY Bilateral 03/03/2014   XI ROBOTIC ASSISTED HIATAL HERNIA REPAIR N/A 05/01/2021   Procedure: XI ROBOTIC ASSISTED TYPE III HIATAL HERNIA REPAIR WITH FUNDOPLICATION;  Surgeon: Luretha Murphy, MD;  Location: [facility];  Service: General;  Laterality: N/A;   HPI:  62 yo admitted 9/28 with RUQ pain with SBO.  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 PMhx: HTN, HLD, peptic ulcer disease, diverticulitis, bipolar disorder, hypothyroidism, GERD, Hashimoto's disease. Hemicolectomy completed on 10/1.    Assessment / Plan / Recommendation  Clinical Impression  Pt was seen for a clinical swallow evaluation and presents with oropharyngeal dysphagia.  Pt was seen with trials of ice chips and thin liquid as she has been cleared to try clear liquids per surgery.  Pt was lethargic, but was able to follow 1-step commands for oral mech examination.  She was not observed to trigger a swallow with ice chip trials x5 and she exhibited an immediate, weak cough with thin liquid via tsp in 3/3 trials.  Recommend continuation of strict NPO at this time with frequent oral care.  SLP will f/u tomorrow to determine readiness for initiation of clear liquids vs  instrumental swallow study.  Spoke with MD who stated that pt will likely receive cortrak if it is recommended that she remains NPO after tomorrow's tx session.  SLP Visit Diagnosis: Dysphagia, oropharyngeal phase (R13.12)    Aspiration Risk  Severe aspiration risk;Risk for inadequate nutrition/hydration    Diet Recommendation NPO    Medication Administration: Via alternative means    Other  Recommendations Oral Care Recommendations: Oral care QID;Staff/trained caregiver to provide  oral care    Recommendations for follow up therapy are one component of a multi-disciplinary discharge planning process, led by the attending physician.  Recommendations may be updated based on patient status, additional functional criteria and insurance authorization.  Follow up Recommendations Skilled nursing-short term rehab (<3 hours/day)      Assistance Recommended at Discharge    Functional Status Assessment Patient has had a recent decline in their functional status and demonstrates the ability to make significant improvements in function in a reasonable and predictable amount of time.  Frequency and Duration min 2x/week  2 weeks       Prognosis Prognosis for improved oropharyngeal function: Fair Barriers to Reach Goals: Severity of deficits      Swallow Study   General HPI: 62 yo admitted 9/28 with RUQ pain with SBO.  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 PMhx: HTN, HLD, peptic ulcer disease, diverticulitis, bipolar disorder, hypothyroidism, GERD, Hashimoto's disease. Hemicolectomy completed on 10/1. Type of Study: Bedside Swallow Evaluation Previous Swallow Assessment: N/A Diet Prior to this Study: NPO;Large bore NG tube (NG set for suction, currently clamped) Temperature Spikes Noted: No Respiratory Status: Nasal cannula (HFNC) History of Recent Intubation: Yes Total duration of intubation (days):  (less than 1 day for procedure) Date extubated: 10/16/22 Behavior/Cognition: Lethargic/Drowsy Oral Cavity Assessment: Excessive secretions Oral Care Completed by SLP: No (MD with pt after BSE/SLE were completed) Oral Cavity - Dentition: Poor condition;Missing dentition Self-Feeding Abilities: Needs assist Patient Positioning: Upright in chair Baseline Vocal Quality: Low vocal intensity Volitional Cough: Weak Volitional Swallow: Unable to elicit    Oral/Motor/Sensory Function Overall Oral Motor/Sensory  Function: Moderate impairment Facial ROM: Reduced left Facial Symmetry: Abnormal symmetry left;Suspected CN VII (facial) dysfunction Lingual Strength: Reduced   Ice Chips Ice chips: Impaired Presentation: Spoon Pharyngeal Phase Impairments: Unable to trigger swallow   Thin Liquid Thin Liquid: Impaired Presentation: Spoon Pharyngeal  Phase Impairments: Cough - Immediate    Nectar Thick Nectar Thick Liquid: Not tested   Honey Thick Honey Thick Liquid: Not tested   Puree Puree: Not tested   Solid     Solid: Not tested     Eino Farber, M.S., CCC-SLP Acute Rehabilitation Services Office: [phone]  Shanon Rosser Hamdi Kley 10/18/2022,12:13 PM

## 2022-10-18 NOTE — Progress Notes (Signed)
Inpatient Rehab Admissions Coordinator:   Met with pt and her sister, Joyce Gross, at the bedside to update on rehab. NG tube remains to suction with plans for clamping trial today.  We will follow for readiness to begin insurance auth, likely early next week.    Estill Dooms, PT, DPT Admissions Coordinator [phone] 10/18/22  12:25 PM

## 2022-10-19 ENCOUNTER — Inpatient Hospital Stay (HOSPITAL_COMMUNITY): Payer: BC Managed Care – PPO

## 2022-10-19 DIAGNOSIS — K56609 Unspecified intestinal obstruction, unspecified as to partial versus complete obstruction: Secondary | ICD-10-CM | POA: Diagnosis not present

## 2022-10-19 DIAGNOSIS — I639 Cerebral infarction, unspecified: Secondary | ICD-10-CM | POA: Diagnosis not present

## 2022-10-19 DIAGNOSIS — E43 Unspecified severe protein-calorie malnutrition: Secondary | ICD-10-CM | POA: Insufficient documentation

## 2022-10-19 LAB — CBC
HCT: 30.7 % — ABNORMAL LOW (ref 36.0–46.0)
Hemoglobin: 9.9 g/dL — ABNORMAL LOW (ref 12.0–15.0)
MCH: 30.2 pg (ref 26.0–34.0)
MCHC: 32.2 g/dL (ref 30.0–36.0)
MCV: 93.6 fL (ref 80.0–100.0)
Platelets: 136 10*3/uL — ABNORMAL LOW (ref 150–400)
RBC: 3.28 MIL/uL — ABNORMAL LOW (ref 3.87–5.11)
RDW: 13.3 % (ref 11.5–15.5)
WBC: 5.5 10*3/uL (ref 4.0–10.5)
nRBC: 0 % (ref 0.0–0.2)

## 2022-10-19 LAB — GLUCOSE, CAPILLARY
Glucose-Capillary: 72 mg/dL (ref 70–99)
Glucose-Capillary: 70 mg/dL (ref 70–99)
Glucose-Capillary: 75 mg/dL (ref 70–99)
Glucose-Capillary: 97 mg/dL (ref 70–99)
Glucose-Capillary: 119 mg/dL — ABNORMAL HIGH (ref 70–99)

## 2022-10-19 LAB — SODIUM
Sodium: 152 mmol/L — ABNORMAL HIGH (ref 135–145)
Sodium: 148 mmol/L — ABNORMAL HIGH (ref 135–145)
Sodium: 149 mmol/L — ABNORMAL HIGH (ref 135–145)

## 2022-10-19 MED ORDER — ACETAMINOPHEN 160 MG/5ML PO SOLN
500.0000 mg | Freq: Four times a day (QID) | ORAL | Status: DC
  Administered 2022-10-19 – 2022-10-23 (×16): 500 mg
  Filled 2022-10-19 (×15): qty 20.3

## 2022-10-19 MED ORDER — ROSUVASTATIN CALCIUM 20 MG PO TABS
20.0000 mg | ORAL_TABLET | Freq: Every day | ORAL | Status: DC
  Administered 2022-10-19 – 2022-10-22 (×4): 20 mg
  Filled 2022-10-19 (×4): qty 1

## 2022-10-19 MED ORDER — METHOCARBAMOL 500 MG PO TABS
500.0000 mg | ORAL_TABLET | Freq: Four times a day (QID) | ORAL | Status: DC
  Administered 2022-10-19 – 2022-10-22 (×13): 500 mg
  Filled 2022-10-19 (×13): qty 1

## 2022-10-19 MED ORDER — SODIUM CHLORIDE 3 % IV SOLN
INTRAVENOUS | Status: DC
  Filled 2022-10-19 (×2): qty 500

## 2022-10-19 MED ORDER — ACETAMINOPHEN 10 MG/ML IV SOLN: 1000.0000 mg | Freq: Four times a day (QID) | INTRAVENOUS | Status: DC

## 2022-10-19 MED ORDER — OSMOLITE 1.5 CAL PO LIQD
1000.0000 mL | ORAL | Status: DC
  Administered 2022-10-19: 1000 mL

## 2022-10-19 MED ORDER — DOCUSATE SODIUM 50 MG/5ML PO LIQD
100.0000 mg | Freq: Two times a day (BID) | ORAL | Status: DC
  Administered 2022-10-19 (×2): 100 mg
  Filled 2022-10-19 (×2): qty 10

## 2022-10-19 MED ORDER — LEVOTHYROXINE SODIUM 88 MCG PO TABS
88.0000 ug | ORAL_TABLET | Freq: Every day | ORAL | Status: DC
  Administered 2022-10-20 – 2022-10-22 (×3): 88 ug
  Filled 2022-10-19 (×4): qty 1

## 2022-10-19 MED ORDER — ONDANSETRON HCL 4 MG/2ML IJ SOLN: 4.0000 mg | Freq: Four times a day (QID) | INTRAMUSCULAR | Status: DC | PRN

## 2022-10-19 MED ORDER — ONDANSETRON HCL 4 MG PO TABS: 4.0000 mg | ORAL_TABLET | Freq: Four times a day (QID) | ORAL | Status: DC | PRN

## 2022-10-19 NOTE — Progress Notes (Signed)
Initial Nutrition Assessment  DOCUMENTATION CODES:   Severe malnutrition in context of acute illness/injury  INTERVENTION:   Initiate trickle tube feeds via Cortrak: - Osmolite 1.5 @ 20 ml/hr (480 ml/day)   RD will monitor for ability to advance tube feeding regimen to goal: - Osmolite 1.5 @ 50 ml/hr (1200 ml/day) - PROSource TF20 60 ml daily  Recommended tube feeding regimen at goal rate would provide 1880 kcal, 95 grams of protein, and 914 ml of H2O.   NUTRITION DIAGNOSIS:   Severe Malnutrition related to acute illness (SBO, stroke) as evidenced by energy intake < or equal to 50% for > or equal to 5 days, mild fat depletion, moderate muscle depletion.  GOAL:   Patient will meet greater than or equal to 90% of their needs  MONITOR:   Diet advancement, Labs, Weight trends, TF tolerance, Skin, I & O's  REASON FOR ASSESSMENT:   Consult Enteral/tube feeding initiation and management (trickle tube feeds, do not advance)  ASSESSMENT:   62 year old female who presented to the ED on 9/28 with abdominal pain, N/V. PMH of complex hiatal hernia s/p multiple surgeries, postoperative esophageal stricture requiring dilation, GERD/PUD, gallstones, colon polyps, HTN, HLD, hypothyroidism, autoimmune urticaria, emphysema, Frieberg's disease, sleep apnea. Pt admitted with suspicion for SBO.  09/29 - NG tube placed to LIWS, CT head showing L PICA stroke 10/01 - CT showing progression of bowel obstruction and cecal volvulus, s/p laparoscopic converted to open R hemicolectomy 10/03 - NG tube clamped, SLP recommending NPO 10/04 - NG tube exchanged for Cortrak (tip gastric), trickle TF initiated  Discussed pt with RN and during ICU rounds. Consult received to start trickle tube feeds via Cortrak with no advancement at this time. Cortrak placed this morning, tip gastric. SLP evaluated pt this morning with recommendation for pt to remain NPO. Per SLP note, likely proceed with MBS next  week.  Spoke with pt's sister at bedside who reports that pt eats fairly well when she is feeling good but has been having issues with "being sick to her stomach" on and off for the last year. [name] states that the last time pt ate anything was Thursday of last week. Pt has been NPO for entirety of 5-day admission.  [name] reports that pt typically eats 2 meals daily. For breakfast, pt just has coffee. For lunch, pt will eat some yogurt or cottage cheese. For dinner, pt will have more of a typical meal.  [name] reports that pt has lost weight over the last 1 year. She reports pt's UBW as 180 lbs and states that pt now weighs about 155 lbs. Weight history in chart supports weight loss over the last 18 months. Overall, pt has lost 14.3 kg since 05/01/21. This is a 16.3% weight loss which is not clinically significant for the timeframe but is concerning.  [name] attributes weight loss to some intentional diet changes and "cutting back" but also to pt eating less due to GI issues. Pt has had more significant GI issues over the last 4 months. Weight loss is likely both intentional and unintentional.  [name]  Medications reviewed and include: colace, IV protonix IVF: NS @ 75 ml/hr  Labs reviewed: sodium 152, chloride 121, hemoglobin 9.9, platelets 136 CBG's: 68-128 x 24 hours  UOP: 1000 ml x 24 hours  NUTRITION - FOCUSED PHYSICAL EXAM:  Flowsheet Row Most Recent Value  Orbital Region Mild depletion  Upper Arm Region Mild depletion  Thoracic and Lumbar Region No depletion  Buccal Region Mild depletion  Temple Region Mild depletion  Clavicle Bone Region Mild depletion  Clavicle and Acromion Bone Region Moderate depletion  Scapular Bone Region Mild depletion  Dorsal Hand Mild depletion  Patellar Region Mild depletion  Anterior Thigh Region Moderate depletion  Posterior Calf Region Mild depletion  Edema (RD Assessment) Mild  Hair  Reviewed  Eyes Reviewed  Mouth Reviewed  Skin Reviewed  Nails Reviewed   Diet Order:   Diet Order             Diet NPO time specified  Diet effective now                   EDUCATION NEEDS:   No education needs have been identified at this time  Skin:  Skin Assessment: Reviewed RN Assessment (closed abd incision)  Last BM:  no documented BM  Height:   Ht Readings from Last 1 Encounters:  10/15/22 5\' 6"  (1.676 m)    Weight:   Wt Readings from Last 1 Encounters:  10/18/22 73.5 kg    Ideal Body Weight:  59.1 kg  BMI:  Body mass index is 26.15 kg/m.  Estimated Nutritional Needs:   Kcal:  1800-2000  Protein:  90-110 grams  Fluid:  1.8-2.0 L    Mertie Clause, MS, RD, LDN Registered Dietitian II Please see AMiON for contact information.

## 2022-10-19 NOTE — Progress Notes (Signed)
Speech Language Pathology Treatment: Dysphagia  Patient Details Name: Joan Robinson MRN: [id number] DOB: 07-15-1960 Today's Date: 10/19/2022 Time: 9629-5284 SLP Time Calculation (min) (ACUTE ONLY): 23 min  Assessment / Plan / Recommendation Clinical Impression  Joan Robinson was sitting in recliner after therapy this am. Joan Robinson, at bedside.  Joan Robinson participated in her own oral care, brushing teeth and using [place] for suctioning.  Volume of voice was low; speech intelligibility compromised. With cues to take a deep breath and increase volume, speech was more intelligible. Joan Robinson accepted several ice chips and 1/2 tspn quantities of water.  She demonstrated good effort to chew ice, oral retention on left, oral spillage bilaterally, and marginal movement of larynx noted during attempts to swallow. All efforts were followed by immediate, wet coughing.  Suspect significant dysphagia given location of stroke.  Recommend proceeding with cortrak as ordered. D/W Joan Robinson and her sister plan for nutrition and swallow remediation.  Recommend continued NPO; will likely proceed with MBS next week; allow occasional ice chips throughout the day (2-3 ice chips every two hours) as well as frequent oral care to maintain integrity of oral mucosa and facilitate efforts to swallow.   HPI HPI: 62 yo admitted 9/28 with RUQ pain with SBO.  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 PMhx: HTN, HLD, peptic ulcer disease, diverticulitis, bipolar disorder, hypothyroidism, GERD, Hashimoto's disease. Hemicolectomy completed on 10/1.      SLP Plan  Continue with current plan of care      Recommendations for follow up therapy are one component of a multi-disciplinary discharge planning process, led by the attending physician.  Recommendations may be updated based on patient status, additional functional criteria and insurance authorization.    Recommendations   Diet recommendations: NPO Medication Administration: Via alternative means                  Oral care prior to ice chip/H20   Frequent or constant Supervision/Assistance Dysphagia, oropharyngeal phase (R13.12)     Continue with current plan of care   Joan Robinson L. Joan Frederic, MA CCC/SLP Clinical Specialist - Acute Care SLP Acute Rehabilitation Services Office number [phone]   Joan Robinson Joan Robinson  10/19/2022, 9:10 AM

## 2022-10-19 NOTE — Progress Notes (Signed)
Physical Therapy Treatment Patient Details Name: Joan Robinson MRN: [id number] DOB: 1960-10-29 Today's Date: 10/19/2022   History of Present Illness 62 yo admitted 9/28 with RUQ pain with SBO.  9/29 facial droop, head CT: left posteroinferior cerebellar artery territory infarction. 9/30 MRI: Large acute infarct affecting both the left PICA and left AICA. 10/1 Rt hemicolectomy. PMhx: HTN, HLD, peptic ulcer disease, diverticulitis, bipolar disorder, hypothyroidism, GERD, Hashimoto's disease.    PT Comments  Pt remains highly motivated, eager to get OOB and move. Pt with improved gait distance and movement of LLE but continues to struggle with ataxia, balance and impaired vision and functional mobility. Sister Joyce Gross present throughout session. Pt with improved sitting balance in chair maintaining midline with back support. Will continue to follow and recommend OOB daily with nursing staff. Patient will benefit from intensive inpatient follow up therapy, >3 hours/day  Pt tolerating activity on 2L with SPO2 >94% HR 65-72     If plan is discharge home, recommend the following: A lot of help with walking and/or transfers;A lot of help with bathing/dressing/bathroom;Assistance with cooking/housework;Direct supervision/assist for medications management;Assist for transportation;Supervision due to cognitive status;Help with stairs or ramp for entrance;Direct supervision/assist for financial management   Can travel by private vehicle        Equipment Recommendations       Recommendations for Other Services Rehab consult     Precautions / Restrictions Precautions Precautions: Fall;Other (comment) Precaution Comments: watch sats, NG tube, BP <220/110 Restrictions Weight Bearing Restrictions: No     Mobility  Bed Mobility Overal bed mobility: Needs Assistance Bed Mobility: Supine to Sit     Supine to sit: Min assist     General bed mobility comments: HOB 10 degrees with min assist to clear  LLE with transition to left side of bed, cues for sequence    Transfers Overall transfer level: Needs assistance   Transfers: Sit to/from Stand, Bed to chair/wheelchair/BSC Sit to Stand: +2 physical assistance, Mod assist Stand pivot transfers: Mod assist         General transfer comment: mod assist to rise from bed, bSC and recliner. Pt with cues for hand placement, balance and sequence. pt with initial lt lean in sitting but able to correct with multimodal cues. Pivot bed to [facility] with mod assist with RW    Ambulation/Gait Ambulation/Gait assistance: Mod assist, +2 physical assistance Gait Distance (Feet): 18 Feet Assistive device: Rolling walker (2 wheels) Gait Pattern/deviations: Step-to pattern, Trunk flexed   Gait velocity interpretation: <1.8 ft/sec, indicate of risk for recurrent falls   General Gait Details: pt with mod left lean with UB support on RW. +2 mod assist for trunk support, managing RW and IV with sister pushing chair behind. Pt with increased left lean with fatigue and needs cues to sit as pt unaware. difficulty with left foot coordination and advancement at times. pt with tendency to lead with LLE, adduct hip and hyperextend knee. Cues for posture, balance, step sequence and foot positioning. Pt walked 12', 18', 8' with seated rest between trials   Stairs             Wheelchair Mobility     Tilt Bed    Modified Rankin (Stroke Patients Only) Modified Rankin (Stroke Patients Only) Pre-Morbid Rankin Score: No symptoms Modified Rankin: Severe disability     Balance Overall balance assessment: Needs assistance Sitting-balance support: Feet unsupported, Bilateral upper extremity supported Sitting balance-Leahy Scale: Fair Sitting balance - Comments: UB support EOB Postural control: Left  lateral lean Standing balance support: Bilateral upper extremity supported, During functional activity, Reliant on assistive device for balance Standing  balance-Leahy Scale: Poor Standing balance comment: UB support on RW with mod assist for balance                            Cognition Arousal: Alert Behavior During Therapy: Flat affect Overall Cognitive Status: Impaired/Different from baseline Area of Impairment: Attention, Following commands, Problem solving, Safety/judgement                 Orientation Level: Time, Disoriented to Current Attention Level: Sustained   Following Commands: Follows one step commands consistently, Follows one step commands with increased time Safety/Judgement: Decreased awareness of safety, Decreased awareness of deficits   Problem Solving: Difficulty sequencing, Requires tactile cues, Requires verbal cues General Comments: speaking throughout and responding to cues to difficult to understand at times and decreased awareness of balance deficits        Exercises General Exercises - Lower Extremity Long Arc Quad: AROM, Right, Left, Seated, 15 reps    General Comments        Pertinent Vitals/Pain Pain Assessment Pain Assessment: No/denies pain    Home Living                          Prior Function            PT Goals (current goals can now be found in the care plan section) Progress towards PT goals: Progressing toward goals    Frequency           PT Plan      Co-evaluation              AM-PAC PT "6 Clicks" Mobility   Outcome Measure  Help needed turning from your back to your side while in a flat bed without using bedrails?: A Little Help needed moving from lying on your back to sitting on the side of a flat bed without using bedrails?: A Little Help needed moving to and from a bed to a chair (including a wheelchair)?: A Lot Help needed standing up from a chair using your arms (e.g., wheelchair or bedside chair)?: A Lot Help needed to walk in hospital room?: Total Help needed climbing 3-5 steps with a railing? : Total 6 Click Score: 12     End of Session Equipment Utilized During Treatment: Oxygen;Gait belt Activity Tolerance: Patient tolerated treatment well Patient left: in chair;with call bell/phone within reach;with chair alarm set;with family/visitor present Nurse Communication: Mobility status PT Visit Diagnosis: Other abnormalities of gait and mobility (R26.89);Unsteadiness on feet (R26.81);Other symptoms and signs involving the nervous system (R29.898)     Time: 8657-8469 PT Time Calculation (min) (ACUTE ONLY): 36 min  Charges:    $Gait Training: 23-37 mins PT General Charges $$ ACUTE PT VISIT: 1 Visit                     Merryl Hacker, PT Acute Rehabilitation Services Office: [phone]    Enedina Finner Jesyca Weisenburger 10/19/2022, 10:13 AM

## 2022-10-19 NOTE — Progress Notes (Signed)
Patient ID: Joan Robinson, female   DOB: [birth year], 62 y.o.   MRN: [id number] [facility] Surgery Progress Note  3 Days Post-Op  Subjective: CC-  Ambulated in the hall with PT. Denies abdominal pain. No N/v since NG clamped 24hr ago. Passing more flatus this morning, no BM.  Objective: Vital signs in last 24 hours: Temp:  [98.2 F (36.8 C)-98.8 F (37.1 C)] 98.2 F (36.8 C) (10/04 0745) Pulse Rate:  [55-101] 70 (10/04 0800) Resp:  [11-26] 14 (10/04 0800) BP: (107-151)/(58-84) 140/73 (10/04 0800) SpO2:  [88 %-100 %] 95 % (10/04 0800) Last BM Date :  (PTA)  Intake/Output from previous day: 10/03 0701 - 10/04 0700 In: 2406.7 [I.V.:1787; IV Piggyback:619.7] Out: 1000 [Urine:1000] Intake/Output this shift: Total I/O In: 84.8 [I.V.:75.1; IV Piggyback:9.7] Out: -   PE: General: Alert, NAD Heart: RRR Lungs: Respiratory effort nonlabored on [place] Abd: soft, minimal distension, few bowel sounds heard, appropriately tender over incision, honeycomb to midline with some dried blood at inferior aspect  Lab Results:  Recent Labs    10/17/22 0629 10/18/22 0957  WBC 7.4 6.7  HGB 12.5 10.8*  HCT 37.7 34.0*  PLT 188 165   BMET Recent Labs    10/17/22 2300 10/18/22 0957 10/18/22 1642 10/18/22 2145 10/19/22 0630  NA 156* 153*   < > 151* 152*  K 4.0 3.6  --   --   --   CL 126* 121*  --   --   --   CO2 25 24  --   --   --   GLUCOSE 119* 107*  --   --   --   BUN 21 21  --   --   --   CREATININE 0.88 0.89  --   --   --   CALCIUM 8.0* 8.5*  --   --   --    < > = values in this interval not displayed.   PT/INR No results for input(s): "LABPROT", "INR" in the last 72 hours. CMP     Component Value Date/Time   NA 152 (H) 10/19/2022 0630   K 3.6 10/18/2022 0957   CL 121 (H) 10/18/2022 0957   CO2 24 10/18/2022 0957   GLUCOSE 107 (H) 10/18/2022 0957   BUN 21 10/18/2022 0957   CREATININE 0.89 10/18/2022 0957   CREATININE 0.91 09/02/2015 1352   CALCIUM 8.5 (L) 10/18/2022  0957   PROT 6.0 (L) 10/18/2022 0957   ALBUMIN 2.8 (L) 10/18/2022 0957   AST 16 10/18/2022 0957   ALT 17 10/18/2022 0957   ALKPHOS 56 10/18/2022 0957   BILITOT 0.5 10/18/2022 0957   GFRNONAA >60 10/18/2022 0957   GFRAA  12/16/2009 2144    >60        The eGFR has been calculated using the MDRD equation. This calculation has not been validated in all clinical situations. eGFR's persistently <60 mL/min signify possible Chronic Kidney Disease.   Lipase     Component Value Date/Time   LIPASE 10 (L) 10/13/2022 2116       Studies/Results: No results found.  Anti-infectives: Anti-infectives (From admission, onward)    Start     Dose/Rate Route Frequency Ordered Stop   10/16/22 2200  cefoTEtan (CEFOTAN) 2 g in sodium chloride 0.9 % 100 mL IVPB        2 g 200 mL/hr over 30 Minutes Intravenous Every 12 hours 10/16/22 1524 10/17/22 1113   10/16/22 0930  cefoTEtan (CEFOTAN) 2 g in sodium chloride  0.9 % 100 mL IVPB        2 g 200 mL/hr over 30 Minutes Intravenous On call to O.R. 10/16/22 0844 10/17/22 0659        Assessment/Plan Cecal volvulus  POD#3 s/p Laparoscopic converted to open right hemicolectomy 10/1 Dr. Cliffton Asters - surgical path pending - Tolerated clamping trial and is passing more flatus. NPO per SLP - ok to start clear liquids if she passes for diet. Likely will not tolerate enough PO, will exchange NG for Cortrak and start trickle tube feedings. - mobilize, therapies - recommending CIR   Acute stroke - CBC pending, if h/h stable then ok to stat plavix today 10/4   FEN: clamp NG, NPO per SLP, start trickle TF VTE: sq heparin ID: cefotetan 10/1>>10/2 Foley: out 10/3 and voiding    LOS: 5 days    Franne Forts, [facility] Surgery 10/19/2022, 8:37 AM Please see Amion for pager number during day hours 7:00am-4:30pm

## 2022-10-19 NOTE — Progress Notes (Addendum)
STROKE TEAM PROGRESS NOTE   INTERVAL HISTORY Her sister is at bedside.  Patient laying in the bed in no apparent distress.  Trach tube has been placed today.  Will continue normal saline at 75 until tube feeds started We will repeat CT head today We will start Plavix today, surgery okay with starting Neurological exam is stable and unchanged  Vitals:   10/19/22 0600 10/19/22 0700 10/19/22 0745 10/19/22 0800  BP: (!) 127/58 125/61  (!) 140/73  Pulse: 70 62  70  Resp: (!) 21 17  14   Temp:   98.2 F (36.8 C)   TempSrc:   Oral   SpO2: 96% 96%  95%  Weight:      Height:       CBC:  Recent Labs  Lab 10/18/22 0957 10/19/22 0836  WBC 6.7 5.5  HGB 10.8* 9.9*  HCT 34.0* 30.7*  MCV 92.6 93.6  PLT 165 136*   Basic Metabolic Panel:  Recent Labs  Lab 10/17/22 2300 10/18/22 0957 10/18/22 1642 10/18/22 2145 10/19/22 0630  NA 156* 153*   < > 151* 152*  K 4.0 3.6  --   --   --   CL 126* 121*  --   --   --   CO2 25 24  --   --   --   GLUCOSE 119* 107*  --   --   --   BUN 21 21  --   --   --   CREATININE 0.88 0.89  --   --   --   CALCIUM 8.0* 8.5*  --   --   --    < > = values in this interval not displayed.   Lipid Panel:  Recent Labs  Lab 10/15/22 0228  CHOL 142  TRIG 60  HDL 76  CHOLHDL 1.9  VLDL 12  LDLCALC 54   HgbA1c:  Recent Labs  Lab 10/15/22 0228  HGBA1C 5.6    IMAGING past 24 hours No results found.  PHYSICAL EXAM Physical Exam  Constitutional: Appears well-developed and well-nourished.  Psych: Affect appropriate to situation Eyes: Normal external eye and conjunctiva. HENT: Normocephalic, no lesions, without obvious abnormality.   Musculoskeletal-no joint tenderness, deformity or swelling Cardiovascular: Normal rate and regular rhythm.  Respiratory: Effort normal, non-labored breathing saturations WNL GI: Soft.  No distension. There is no tenderness.  Skin: WDI   Neuro:  Pt awake and alert, eyes open spontaneously, orientated to age, place,  time and people. No aphasia, but paucity of speech, answer questions with short words, following all simple commands. Able to name and repeat in dysarthric voice. Bilateral left gaze palsy and eyes disconjugate with left eye mildly upward position, and decreased left eye upward and downward movement, visual field full. Left facial droop with decreased eye blinking on the left. Tongue midline. Muscle strength bilaterally symmetrical. Significant left FTN and HTS ataxia. Sensation symmetrical bilaterally, gait not tested.     ASSESSMENT/PLAN Kenzleigh Sedam Ruta is a 62 y.o. female with past medical history of esophageal strictures status post dilation, multiple hernias status post multiple surgeries, GERD/PUD, hypertension, hyperlipidemia, autoimmune urticaria, emphysema, sleep apnea who presents to the ED with right upper quadrant and right lower quadrant abdominal pain x 2 days associated nausea.  She was found to have small bowel obstruction and was being managed inpatient at [facility].  Then, noted to have left facial droop, slurring of her speech, and CT head without contrast which was notable for a moderately  large left PICA territory infarct. CT angio head and neck with and without contrast was negative for any large vessel occlusion.  Specifically, no basilar occlusion or thrombosis noted.  CT perfusion with no core or mismatch noted.  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 Confluent cytotoxic edema but no hemorrhagic transformation, no interval extension, and no significant posterior fossa mass effect. CTA head & neck left AICA not visualized Ct perfusion: no core MRI Large acute infarct (affecting both the left PICA and left AICA vascular territories). Posterior fossa mass effect with effacement of the fourth ventricle inferiorly. No evidence of obstructive  hydrocephalus at this time.  CT repeat 10/2 showed unchanged appearance of left cerebellar infarct.  No acute hemorrhage. Repeat CT 10/4-ordered 2D Echo EF 60-65% LDL 54 HgbA1c 5.6 VTE prophylaxis - heparin subcutaneous  No antithrombotic prior to admission, start Plavix today. ASA allergy Therapy recommendations:  CIR Disposition:  pending  Cerebellar edema Mild mass effect to compress on the brainstem Risk of developing hydrocephalus On 3% saline @75 ->NS @75 ->3% saline @ 50 Need close clinical and imaging monitoring CT repeat 10/2 stable infarct, no hemorrhage, no hydrocephalus CT repeat pending 10/4, if stable may consider taper off 3% Sodium 150-153-154-156-153-152->148 Sodium check every 6 hours  SBO Pt admitted for abdominal pain x 2 days with N/V Found to have SBO, may related to her previous multiple surgeries for hernia Surgery on board Now NPO with NG suction KUB - Dilated loops of large bowel seen in the right upper quadrant and multiple dilated loops of small bowel, 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 CT abd/pelvis progression of SBO Status post surgery treatment 10/1   Hypertension Home meds:  none Stable Long-term BP goal normotensive  Hyperlipidemia Home meds:  crestor 20mg   LDL 54, goal < 70 Resume home crestor 20  Continue statin at discharge  Other Stroke Risk Factors Sleep apnea, not on CPAP at home  Other Active Problems Esophageal stricture s/p dilation  PUD multiple hernias status post multiple surgeries  GERD Autoimmune urticaria  Emphysema  Hypokalemia, supplemented  Hospital day # 5  Gevena Mart DNP, ACNPC-AG  [facility]  ATTENDING NOTE: I reviewed above note and agree with the assessment and plan. Pt was seen and examined.   Sister at the bedside. Pt lying in bed, still lethargic but more awake alert and interactive. She still has left facial droop, left gaze  palsy seems improving. Left ataxia continues. Na down ot 148 and restarted 3% saline today. Did not pass swallow, put on cortrak. Surgery OK with starting trickle feeds and plavix. Also resume home crestor. Will repeat CT in am, if stable, may consider taper off 3% saline. PT and OT recommend CIR.   For detailed assessment and plan, please refer to above/below as I have made changes wherever appropriate.   Marvel Plan, MD PhD Stroke Neurology 10/19/2022 4:21 PM  This patient is critically ill due to large cerebellar infarct, cerebellar edema, SBO and at significant risk of neurological worsening, death form hydrocephalus, brain herniation, acute abdominal syndrome. This patient's care requires constant monitoring of vital signs, hemodynamics, respiratory and cardiac monitoring, review of multiple databases, neurological assessment, discussion with family, other specialists and medical decision making of high complexity. I spent 35 minutes of neurocritical care time in the care of this patient. I had long discussion with sister at  bedside, updated pt current condition, treatment plan and potential prognosis, and answered all the questions. She expressed understanding and appreciation.I also discussed with pharmacy.   To contact Stroke Continuity provider, please refer to [email or web address]. After hours, contact General Neurology

## 2022-10-19 NOTE — Progress Notes (Signed)
Inpatient Rehab Admissions Coordinator:   Will open insurance request today since NG tube is being d/c'd.    Estill Dooms, PT, DPT Admissions Coordinator [phone] 10/19/22  10:27 AM

## 2022-10-19 NOTE — TOC Progression Note (Signed)
Transition of Care [facility]) - Progression Note    Patient Details  Name: Joan Robinson MRN: [id number] Date of Birth: February 23, 1960  Transition of Care [facility] CM/SW Contact  Tom-Johnson, Hershal Coria, RN Phone Number: 10/19/2022, 1:34 PM  Clinical Narrative:     Patient's NG tube d/c'd, Cortrak placed and Trickle feeds started. RD following.  CIR to start Insurance Auth for possible admission.   Patient not Medically ready for discharge.  CM will continue to follow as patient progresses with care towards discharge.       Expected Discharge Plan and Services                                               Social Determinants of Health (SDOH) Interventions SDOH Screenings   Food Insecurity: No Food Insecurity (10/14/2022)  Housing: Low Risk  (10/14/2022)  Transportation Needs: No Transportation Needs (10/14/2022)  Utilities: Not At Risk (10/14/2022)  Financial Resource Strain: Low Risk  (05/29/2022)   Received from [facility], [facility]  Physical Activity: Sufficiently Active (05/29/2022)   Received from [facility], [facility]  Social Connections: Moderately Integrated (05/29/2022)   Received from [facility], [facility]  Stress: Stress Concern Present (05/29/2022)   Received from [facility], [facility]  Tobacco Use: Medium Risk (10/14/2022)    Readmission Risk Interventions    10/16/2022    9:11 AM 07/17/2021    1:09 PM  Readmission Risk Prevention Plan  Post Dischage Appt Complete   Medication Screening Complete   Transportation Screening Complete Complete  PCP or Specialist Appt within 5-7 Days  Complete  Home Care Screening  Complete  Medication Review (RN CM)  Complete

## 2022-10-19 NOTE — Procedures (Signed)
Cortrak  Leffel Inserting Tube:  Greig Castilla D, RD Tube Type:  Cortrak - 43 inches Tube Size:  10 Tube Location:  Left nare Secured by: Bridle Technique Used to Measure Tube Placement:  Marking at nare/corner of mouth Cortrak Secured At:  61 cm Procedure Comments:  Cortrak Tube Team Note:  Consult received to place a Cortrak feeding tube.   X-ray is required, abdominal x-ray has been ordered by the Cortrak team. Please confirm tube placement before using the Cortrak tube.   If the tube becomes dislodged please keep the tube and contact the Cortrak team at [email or web address] for replacement.  If after hours and replacement cannot be delayed, place a NG tube and confirm placement with an abdominal x-ray.    Greig Castilla, RD, LDN Clinical Dietitian RD pager # available in [facility]  After hours/weekend pager # available in AMION\

## 2022-10-20 DIAGNOSIS — K56609 Unspecified intestinal obstruction, unspecified as to partial versus complete obstruction: Secondary | ICD-10-CM | POA: Diagnosis not present

## 2022-10-20 LAB — GLUCOSE, CAPILLARY
Glucose-Capillary: 100 mg/dL — ABNORMAL HIGH (ref 70–99)
Glucose-Capillary: 113 mg/dL — ABNORMAL HIGH (ref 70–99)
Glucose-Capillary: 108 mg/dL — ABNORMAL HIGH (ref 70–99)
Glucose-Capillary: 107 mg/dL — ABNORMAL HIGH (ref 70–99)
Glucose-Capillary: 119 mg/dL — ABNORMAL HIGH (ref 70–99)
Glucose-Capillary: 121 mg/dL — ABNORMAL HIGH (ref 70–99)

## 2022-10-20 LAB — CBC
HCT: 30.6 % — ABNORMAL LOW (ref 36.0–46.0)
Hemoglobin: 9.9 g/dL — ABNORMAL LOW (ref 12.0–15.0)
MCH: 30 pg (ref 26.0–34.0)
MCHC: 32.4 g/dL (ref 30.0–36.0)
MCV: 92.7 fL (ref 80.0–100.0)
Platelets: 129 10*3/uL — ABNORMAL LOW (ref 150–400)
RBC: 3.3 MIL/uL — ABNORMAL LOW (ref 3.87–5.11)
RDW: 13.1 % (ref 11.5–15.5)
WBC: 4.2 10*3/uL (ref 4.0–10.5)
nRBC: 0 % (ref 0.0–0.2)

## 2022-10-20 LAB — SODIUM
Sodium: 148 mmol/L — ABNORMAL HIGH (ref 135–145)
Sodium: 148 mmol/L — ABNORMAL HIGH (ref 135–145)
Sodium: 147 mmol/L — ABNORMAL HIGH (ref 135–145)

## 2022-10-20 LAB — BASIC METABOLIC PANEL
Anion gap: 13 (ref 5–15)
BUN: 14 mg/dL (ref 8–23)
CO2: 25 mmol/L (ref 22–32)
Calcium: 8 mg/dL — ABNORMAL LOW (ref 8.9–10.3)
Chloride: 112 mmol/L — ABNORMAL HIGH (ref 98–111)
Creatinine, Ser: 0.78 mg/dL (ref 0.44–1.00)
GFR, Estimated: >60 mL/min (ref >60)
Glucose, Bld: 130 mg/dL — ABNORMAL HIGH (ref 70–99)
Potassium: 3.3 mmol/L — ABNORMAL LOW (ref 3.5–5.1)
Sodium: 150 mmol/L — ABNORMAL HIGH (ref 135–145)

## 2022-10-20 MED ORDER — POTASSIUM CHLORIDE 20 MEQ PO PACK
40.0000 meq | PACK | ORAL | Status: AC
  Administered 2022-10-20 (×2): 40 meq
  Filled 2022-10-20 (×2): qty 2

## 2022-10-20 NOTE — Progress Notes (Signed)
STROKE TEAM PROGRESS NOTE   INTERVAL HISTORY Her sister is at bedside.   Doing well.  She is worried about her job and getting back to work.  Tolerating Tfs.   CT from yesterday stable. Clinically doing better. Taper 3%. Discussed with ICU nurse.  Vitals:   10/20/22 0700 10/20/22 0745 10/20/22 0800 10/20/22 0900  BP: (!) 150/83  (!) 142/110 (!) 163/100  Pulse: 70   91  Resp: (!) 22  14 (!) 31  Temp:  98.7 F (37.1 C)    TempSrc:  Oral    SpO2: 92%   92%  Weight:      Height:       CBC:  Recent Labs  Lab 10/19/22 0836 10/20/22 0652  WBC 5.5 4.2  HGB 9.9* 9.9*  HCT 30.7* 30.6*  MCV 93.6 92.7  PLT 136* 129*   Basic Metabolic Panel:  Recent Labs  Lab 10/18/22 0957 10/18/22 1642 10/20/22 0241 10/20/22 0652  NA 153*   < > 148* 150*  K 3.6  --   --  3.3*  CL 121*  --   --  112*  CO2 24  --   --  25  GLUCOSE 107*  --   --  130*  BUN 21  --   --  14  CREATININE 0.89  --   --  0.78  CALCIUM 8.5*  --   --  8.0*   < > = values in this interval not displayed.   Lipid Panel:  Recent Labs  Lab 10/15/22 0228  CHOL 142  TRIG 60  HDL 76  CHOLHDL 1.9  VLDL 12  LDLCALC 54   HgbA1c:  Recent Labs  Lab 10/15/22 0228  HGBA1C 5.6    IMAGING past 24 hours CT HEAD WO CONTRAST ( )  Result Date: 10/19/2022 CLINICAL DATA:  Stroke, follow-up. EXAM: CT HEAD WITHOUT CONTRAST TECHNIQUE: Contiguous axial images were obtained from the base of the skull through the vertex without intravenous contrast. RADIATION DOSE REDUCTION: This exam was performed according to the departmental dose-optimization program which includes automated exposure control, adjustment of the mA and/or kV according to patient size and/or use of iterative reconstruction technique. COMPARISON:  CT head without contrast 10/17/2022. MR head without contrast 10/15/2022. FINDINGS: Brain: A left anterior inferior cerebellar hypodense nonhemorrhagic infarct is stable in size. Local mass effect is present with  effacement of the sulci. There is some mass effect on the fourth ventricle without obstruction. The supratentorial brain is within normal limits. Deep brain nuclei are within normal limits. The ventricles are of normal size. No significant extraaxial fluid collection is present. Midline structures are within normal limits. Vascular: No hyperdense vessel or unexpected calcification. Skull: Calvarium is intact. No focal lytic or blastic lesions are present. No significant extracranial soft tissue lesion is present. Sinuses/Orbits: Bilateral lens replacements are noted. Globes and orbits are otherwise unremarkable. The fluid level is present in the right maxillary sinus. Anterior right ethmoid air cells are opacified. The paranasal sinuses and mastoid air cells are otherwise clear. IMPRESSION: 1. Stable size of left anterior inferior cerebellar hypodense nonhemorrhagic infarct. 2. Local mass effect with effacement of the sulci. There is some mass effect on the fourth ventricle without obstruction. 3. No other acute or focal lesion to explain the patient's symptoms. Electronically Signed   By: Marin Roberts M.D.   On: 10/19/2022 14:46   DG Abd 1 View  Result Date: 10/19/2022 CLINICAL DATA:  Feeding tube placement. EXAM: ABDOMEN - 1  VIEW COMPARISON:  Radiograph 10/15/2022 FINDINGS: Weighted enteric tube in place. The tube is looped in the stomach with tip directed distally in the region of the mid distal gastric body. Decreased gaseous bowel distention in the upper abdomen. IMPRESSION: Weighted enteric tube looped in the stomach with tip directed distally in the region of the mid distal gastric body. Electronically Signed   By: Narda Rutherford M.D.   On: 10/19/2022 11:05    PHYSICAL EXAM Physical Exam  Constitutional: Appears well-developed and well-nourished. Sitting up in chair. Psych: Affect appropriate to situation HENT: Normocephalic. NG tube in place.  Musculoskeletal-no joint tenderness,  deformity or swelling Cardiovascular: Normal rate and regular rhythm.  Respiratory: Effort normal, non-labored GI: Soft.  No distension. There is no tenderness.  Skin: WDI   Neuro:   MS: alert, awake. Oriented to person, place time.  Follows all commands, no aphasia. Dysarthria present.  CN: PERRL, EOMI. Left facial droop severe.  Tongue midline. No visual field cut.  Motor: able to lift all extemities but drift in left arm/leg.  Ataxia: present in left arm/leg.  Sensation: intact.     ASSESSMENT/PLAN Joan Robinson is a 62 y.o. female with past medical history of esophageal strictures status post dilation, multiple hernias status post multiple surgeries, GERD/PUD, hypertension, hyperlipidemia, autoimmune urticaria, emphysema, sleep apnea who presents to the ED with right upper quadrant and right lower quadrant abdominal pain x 2 days associated nausea.  She was found to have small bowel obstruction and was being managed inpatient at [facility].  Then, noted to have left facial droop, slurring of her speech, and CT head without contrast which was notable for a moderately large left PICA territory infarct. CT angio head and neck with and without contrast was negative for any large vessel occlusion.  Specifically, no basilar occlusion or thrombosis noted.  CT perfusion with no core or mismatch noted.  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 Confluent cytotoxic edema but no hemorrhagic transformation, no interval extension, and no significant posterior fossa mass effect. CTA head & neck left AICA not visualized Ct perfusion: no core MRI Large acute infarct (affecting both the left PICA and left AICA vascular territories). Posterior fossa mass effect with effacement of the fourth ventricle inferiorly. No evidence of obstructive hydrocephalus at this time.   CT repeat 10/2 showed unchanged appearance of left cerebellar infarct.  No acute hemorrhage. Repeat CT 10/4-stable with no worsening.  2D Echo EF 60-65% LDL 54 HgbA1c 5.6 VTE prophylaxis - heparin subcutaneous  No antithrombotic prior to admission, con' t Plavix. ASA allergy Therapy recommendations:  CIR Disposition:  pending  Cerebellar edema Mild mass effect to compress on the brainstem Risk of developing hydrocephalus On 3% saline @75 ->NS @75 ->3% saline @ 25 Need close clinical and imaging monitoring CT repeat 10/2 stable infarct, no hemorrhage, no hydrocephalus CT repeat stable, will  taper off 3% Sodium 150-153-154-156-153-152->148>150 Sodium check every 6 hours  SBO Pt admitted for abdominal pain x 2 days with N/V Found to have SBO, may related to her previous multiple surgeries for hernia Surgery on board Now NPO with NG suction KUB - Dilated loops of large bowel seen in the right upper quadrant and multiple dilated loops of small bowel, 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 CT abd/pelvis progression of SBO Status post surgery treatment 10/1   Hypertension Home  meds:  none Stable Long-term BP goal normotensive  Hyperlipidemia Home meds:  crestor 20mg   LDL 54, goal < 70 Con't  home crestor 20  Continue statin at discharge  Other Stroke Risk Factors Sleep apnea, not on CPAP at home  Other Active Problems Esophageal stricture s/p dilation  PUD multiple hernias status post multiple surgeries  GERD Autoimmune urticaria  Emphysema  Hypokalemia, supplemented  Hospital day # 6  D/C 3%. Q4 neuro checks. Con't close monitoring.   This patient is critically ill due to large cerebellar infarct, cerebellar edema, SBO and at significant risk of neurological worsening, death form hydrocephalus, brain herniation, acute abdominal syndrome. This patient's care requires constant monitoring of vital signs,  hemodynamics, respiratory and cardiac monitoring, review of multiple databases, neurological assessment, discussion with family, other specialists and medical decision making of high complexity. I spent 35 minutes of neurocritical care time in the care of this patient. Discussed with CCM and [name].   To contact Stroke Continuity provider, please refer to [email or web address]. After hours, contact General Neurology

## 2022-10-20 NOTE — Progress Notes (Signed)
Physical Therapy Treatment Patient Details Name: Joan Robinson MRN: [id number] DOB: [birth year] Today's Date: 10/20/2022   History of Present Illness 62 yo admitted 9/28 with RUQ pain with SBO.  9/29 facial droop, head CT: left posteroinferior cerebellar artery territory infarction. 9/30 MRI: Large acute infarct affecting both the left PICA and left AICA. 10/1 Rt hemicolectomy. PMhx: HTN, HLD, peptic ulcer disease, diverticulitis, bipolar disorder, hypothyroidism, GERD, Hashimoto's disease.    PT Comments  Pt admitted with above diagnosis. Pt was able to ambulate with rollator with max assist of 2 overall due to significant postural stability issues due to left UE and LE decr coordination. Pt needs hands on assist and max cues for safety and balance.  Pt will continue to need extensive assist.  Pt currently with functional limitations due to the deficits listed below (see PT Problem List). Pt will benefit from acute skilled PT to increase their independence and safety with mobility to allow discharge.       If plan is discharge home, recommend the following: A lot of help with walking and/or transfers;A lot of help with bathing/dressing/bathroom;Assistance with cooking/housework;Direct supervision/assist for medications management;Assist for transportation;Supervision due to cognitive status;Help with stairs or ramp for entrance;Direct supervision/assist for financial management   Can travel by private vehicle        Equipment Recommendations  None recommended by PT    Recommendations for Other Services Rehab consult     Precautions / Restrictions Precautions Precautions: Fall;Other (comment) Precaution Comments: watch sats, NG tube Restrictions Weight Bearing Restrictions: No     Mobility  Bed Mobility Overal bed mobility: Needs Assistance Bed Mobility: Supine to Sit Rolling: Min assist Sidelying to sit: Min assist Supine to sit: Min assist     General bed mobility comments:  HOB 10 degrees with min assist to clear LLE with transition to left side of bed, cues for sequence    Transfers Overall transfer level: Needs assistance Equipment used: Rolling walker (2 wheels) Transfers: Sit to/from Stand, Bed to chair/wheelchair/BSC Sit to Stand: +2 physical assistance, Mod assist           General transfer comment: mod assist +2 to rise from bed and recliner. Pt with cues for hand placement, balance and sequence. Pt with  lt lean in sitting but able to correct with multimodal cues. Incr left lean as pt fatigues as well.    Ambulation/Gait Ambulation/Gait assistance: +2 physical assistance, Max assist Gait Distance (Feet): 20 Feet (18 feet and then 20 feet) Assistive device: Rollator (4 wheels) Gait Pattern/deviations: Step-to pattern, Trunk flexed, Ataxic, Narrow base of support, Decreased weight shift to right, Decreased stance time - right   Gait velocity interpretation: <1.31 ft/sec, indicative of household ambulator   General Gait Details: pt with mod left lean with UB support on Rollator. +2 mod to max assist for trunk support, managing Rollator and IV with sister pushing chair behind. Pt with increased left lean with fatigue and needs cues to sit as pt unaware. difficulty with left foot coordination and advancement at times. pt with tendency to lead with LLE, adduct hip and hyperextend knee. Cues for posture, balance, step sequence and foot positioning. Pt walked 18 feet then 20 feet with seated rest between trials   Stairs             Wheelchair Mobility     Tilt Bed    Modified Rankin (Stroke Patients Only) Modified Rankin (Stroke Patients Only) Pre-Morbid Rankin Score: No symptoms Modified Rankin: Severe disability  Balance Overall balance assessment: Needs assistance Sitting-balance support: Feet unsupported, Bilateral upper extremity supported Sitting balance-Leahy Scale: Poor Sitting balance - Comments: UB support EOB needing  assist at times due to left lean and poor postural stability as she scootted to EOB Postural control: Left lateral lean Standing balance support: Bilateral upper extremity supported, During functional activity, Reliant on assistive device for balance Standing balance-Leahy Scale: Poor Standing balance comment: UB support on Rollator with max +2 assist for balance                            Cognition Arousal: Alert Behavior During Therapy: Flat affect Overall Cognitive Status: Impaired/Different from baseline Area of Impairment: Attention, Following commands, Problem solving, Safety/judgement                 Orientation Level: Time, Disoriented to Current Attention Level: Sustained   Following Commands: Follows one step commands consistently, Follows one step commands with increased time Safety/Judgement: Decreased awareness of safety, Decreased awareness of deficits   Problem Solving: Difficulty sequencing, Requires tactile cues, Requires verbal cues General Comments: speaking throughout and responding to cues to difficult to understand at times and decreased awareness of balance deficits        Exercises General Exercises - Lower Extremity Ankle Circles/Pumps: AROM, Both, 10 reps, Supine Long Arc Quad: AROM, Right, Left, Seated, 15 reps Heel Slides: AROM, Right, Left, 10 reps, Supine    General Comments General comments (skin integrity, edema, etc.): VSS with HR to 120 bpm, O2 on RA to 90%.      Pertinent Vitals/Pain Pain Assessment Pain Assessment: No/denies pain Faces Pain Scale: No hurt    Home Living                          Prior Function            PT Goals (current goals can now be found in the care plan section) Acute Rehab PT Goals Patient Stated Goal: return home, garden Progress towards PT goals: Progressing toward goals    Frequency    Min 1X/week      PT Plan      Co-evaluation              AM-PAC PT "6  Clicks" Mobility   Outcome Measure  Help needed turning from your back to your side while in a flat bed without using bedrails?: A Little Help needed moving from lying on your back to sitting on the side of a flat bed without using bedrails?: A Little Help needed moving to and from a bed to a chair (including a wheelchair)?: A Lot Help needed standing up from a chair using your arms (e.g., wheelchair or bedside chair)?: Total Help needed to walk in hospital room?: Total Help needed climbing 3-5 steps with a railing? : Total 6 Click Score: 11    End of Session Equipment Utilized During Treatment: Oxygen;Gait belt Activity Tolerance: Patient limited by fatigue Patient left: in chair;with call bell/phone within reach;with chair alarm set;with family/visitor present Nurse Communication: Mobility status;Need for lift equipment (Use Stedy for safety) PT Visit Diagnosis: Other abnormalities of gait and mobility (R26.89);Unsteadiness on feet (R26.81);Other symptoms and signs involving the nervous system (R29.898)     Time: 1610-9604 PT Time Calculation (min) (ACUTE ONLY): 30 min  Charges:    $Gait Training: 23-37 mins PT General Charges $$ ACUTE PT VISIT: 1 Visit  [facility] M,PT Acute Rehab Services [phone]    Bevelyn Buckles 10/20/2022, 10:15 AM

## 2022-10-20 NOTE — Plan of Care (Signed)
Problem: Education: Goal: Knowledge of General Education information will improve Description: Including pain rating scale, medication(s)/side effects and non-pharmacologic comfort measures 10/20/2022 1718 by Annie Main, RN Outcome: Progressing 10/20/2022 1718 by Annie Main, RN Outcome: Progressing   Problem: Health Behavior/Discharge Planning: Goal: Ability to manage health-related needs will improve 10/20/2022 1718 by Annie Main, RN Outcome: Progressing 10/20/2022 1718 by Annie Main, RN Outcome: Progressing   Problem: Clinical Measurements: Goal: Ability to maintain clinical measurements within normal limits will improve 10/20/2022 1718 by Annie Main, RN Outcome: Progressing 10/20/2022 1718 by Annie Main, RN Outcome: Progressing Goal: Will remain free from infection 10/20/2022 1718 by Annie Main, RN Outcome: Progressing 10/20/2022 1718 by Annie Main, RN Outcome: Progressing Goal: Diagnostic test results will improve Outcome: Progressing Goal: Respiratory complications will improve Outcome: Progressing Goal: Cardiovascular complication will be avoided Outcome: Progressing   Problem: Activity: Goal: Risk for activity intolerance will decrease Outcome: Progressing   Problem: Nutrition: Goal: Adequate nutrition will be maintained Outcome: Progressing   Problem: Coping: Goal: Level of anxiety will decrease Outcome: Progressing   Problem: Elimination: Goal: Will not experience complications related to bowel motility Outcome: Progressing Goal: Will not experience complications related to urinary retention Outcome: Progressing   Problem: Pain Managment: Goal: General experience of comfort will improve Outcome: Progressing   Problem: Safety: Goal: Ability to remain free from injury will improve Outcome: Progressing   Problem: Skin Integrity: Goal: Risk for impaired skin integrity will decrease Outcome:  Progressing   Problem: Education: Goal: Knowledge of General Education information will improve Description: Including pain rating scale, medication(s)/side effects and non-pharmacologic comfort measures Outcome: Progressing   Problem: Health Behavior/Discharge Planning: Goal: Ability to manage health-related needs will improve Outcome: Progressing   Problem: Clinical Measurements: Goal: Ability to maintain clinical measurements within normal limits will improve Outcome: Progressing Goal: Will remain free from infection Outcome: Progressing Goal: Diagnostic test results will improve Outcome: Progressing Goal: Respiratory complications will improve Outcome: Progressing Goal: Cardiovascular complication will be avoided Outcome: Progressing   Problem: Activity: Goal: Risk for activity intolerance will decrease Outcome: Progressing   Problem: Nutrition: Goal: Adequate nutrition will be maintained Outcome: Progressing   Problem: Coping: Goal: Level of anxiety will decrease Outcome: Progressing   Problem: Elimination: Goal: Will not experience complications related to bowel motility Outcome: Progressing Goal: Will not experience complications related to urinary retention Outcome: Progressing   Problem: Pain Managment: Goal: General experience of comfort will improve Outcome: Progressing   Problem: Safety: Goal: Ability to remain free from injury will improve Outcome: Progressing   Problem: Skin Integrity: Goal: Risk for impaired skin integrity will decrease Outcome: Progressing   Problem: Education: Goal: Knowledge of disease or condition will improve Outcome: Progressing Goal: Knowledge of secondary prevention will improve (MUST DOCUMENT ALL) Outcome: Progressing Goal: Knowledge of patient specific risk factors will improve Loraine Leriche N/A or DELETE if not current risk factor) Outcome: Progressing   Problem: Ischemic Stroke/TIA Tissue Perfusion: Goal: Complications  of ischemic stroke/TIA will be minimized Outcome: Progressing   Problem: Coping: Goal: Will verbalize positive feelings about self Outcome: Progressing Goal: Will identify appropriate support needs Outcome: Progressing   Problem: Health Behavior/Discharge Planning: Goal: Ability to manage health-related needs will improve Outcome: Progressing Goal: Goals will be collaboratively established with patient/family Outcome: Progressing   Problem: Self-Care: Goal: Ability to participate in self-care as condition permits will improve Outcome: Progressing Goal: Verbalization of feelings and concerns over difficulty with self-care will improve Outcome: Progressing Goal:  Ability to communicate needs accurately will improve Outcome: Progressing   Problem: Nutrition: Goal: Risk of aspiration will decrease Outcome: Progressing Goal: Dietary intake will improve Outcome: Progressing

## 2022-10-20 NOTE — Plan of Care (Signed)

## 2022-10-20 NOTE — Progress Notes (Signed)
Patient ID: Joan Robinson, female   DOB: 1960-01-31, 62 y.o.   MRN: [id number] [facility] Surgery Progress Note  4 Days Post-Op  Subjective: CC-  Reports she only has abdominal pain on the left side when she coughs. No abdominal pain at rest. Tolerating tf's at 20cc/hr. NPO per SLP. Passing flatus. BM yesterday and today.   Objective: Vital signs in last 24 hours: Temp:  [97.9 F (36.6 C)-98.7 F (37.1 C)] 98.7 F (37.1 C) (10/05 0745) Pulse Rate:  [57-104] 91 (10/05 0900) Resp:  [14-31] 31 (10/05 0900) BP: (131-165)/(67-119) 163/100 (10/05 0900) SpO2:  [89 %-98 %] 92 % (10/05 0900) Weight:  [73.9 kg] 73.9 kg (10/05 0500) Last BM Date : 10/20/22  Intake/Output from previous day: 10/04 0701 - 10/05 0700 In: 1615.7 [I.V.:1181.4; NG/GT:379.3; IV Piggyback:55] Out: 1650 [Urine:1650] Intake/Output this shift: No intake/output data recorded.  PE: General: Alert, NAD Abd: soft, ND, +BS, appropriately tender over incision, honeycomb to midline with some dried blood at inferior aspect but no evidence of active drainage.   Lab Results:  Recent Labs    10/19/22 0836 10/20/22 0652  WBC 5.5 4.2  HGB 9.9* 9.9*  HCT 30.7* 30.6*  PLT 136* 129*   BMET Recent Labs    10/18/22 0957 10/18/22 1642 10/20/22 0241 10/20/22 0652  NA 153*   < > 148* 150*  K 3.6  --   --  3.3*  CL 121*  --   --  112*  CO2 24  --   --  25  GLUCOSE 107*  --   --  130*  BUN 21  --   --  14  CREATININE 0.89  --   --  0.78  CALCIUM 8.5*  --   --  8.0*   < > = values in this interval not displayed.   PT/INR No results for input(s): "LABPROT", "INR" in the last 72 hours. CMP     Component Value Date/Time   NA 150 (H) 10/20/2022 0652   K 3.3 (L) 10/20/2022 0652   CL 112 (H) 10/20/2022 0652   CO2 25 10/20/2022 0652   GLUCOSE 130 (H) 10/20/2022 0652   BUN 14 10/20/2022 0652   CREATININE 0.78 10/20/2022 0652   CREATININE 0.91 09/02/2015 1352   CALCIUM 8.0 (L) 10/20/2022 0652   PROT 6.0 (L)  10/18/2022 0957   ALBUMIN 2.8 (L) 10/18/2022 0957   AST 16 10/18/2022 0957   ALT 17 10/18/2022 0957   ALKPHOS 56 10/18/2022 0957   BILITOT 0.5 10/18/2022 0957   GFRNONAA >60 10/20/2022 0652   GFRAA  12/16/2009 2144    >60        The eGFR has been calculated using the MDRD equation. This calculation has not been validated in all clinical situations. eGFR's persistently <60 mL/min signify possible Chronic Kidney Disease.   Lipase     Component Value Date/Time   LIPASE 10 (L) 10/13/2022 2116       Studies/Results: CT HEAD WO CONTRAST ( )  Result Date: 10/19/2022 CLINICAL DATA:  Stroke, follow-up. EXAM: CT HEAD WITHOUT CONTRAST TECHNIQUE: Contiguous axial images were obtained from the base of the skull through the vertex without intravenous contrast. RADIATION DOSE REDUCTION: This exam was performed according to the departmental dose-optimization program which includes automated exposure control, adjustment of the mA and/or kV according to patient size and/or use of iterative reconstruction technique. COMPARISON:  CT head without contrast 10/17/2022. MR head without contrast 10/15/2022. FINDINGS: Brain: A left anterior inferior cerebellar  hypodense nonhemorrhagic infarct is stable in size. Local mass effect is present with effacement of the sulci. There is some mass effect on the fourth ventricle without obstruction. The supratentorial brain is within normal limits. Deep brain nuclei are within normal limits. The ventricles are of normal size. No significant extraaxial fluid collection is present. Midline structures are within normal limits. Vascular: No hyperdense vessel or unexpected calcification. Skull: Calvarium is intact. No focal lytic or blastic lesions are present. No significant extracranial soft tissue lesion is present. Sinuses/Orbits: Bilateral lens replacements are noted. Globes and orbits are otherwise unremarkable. The fluid level is present in the right maxillary sinus.  Anterior right ethmoid air cells are opacified. The paranasal sinuses and mastoid air cells are otherwise clear. IMPRESSION: 1. Stable size of left anterior inferior cerebellar hypodense nonhemorrhagic infarct. 2. Local mass effect with effacement of the sulci. There is some mass effect on the fourth ventricle without obstruction. 3. No other acute or focal lesion to explain the patient's symptoms. Electronically Signed   By: Marin Roberts M.D.   On: 10/19/2022 14:46   DG Abd 1 View  Result Date: 10/19/2022 CLINICAL DATA:  Feeding tube placement. EXAM: ABDOMEN - 1 VIEW COMPARISON:  Radiograph 10/15/2022 FINDINGS: Weighted enteric tube in place. The tube is looped in the stomach with tip directed distally in the region of the mid distal gastric body. Decreased gaseous bowel distention in the upper abdomen. IMPRESSION: Weighted enteric tube looped in the stomach with tip directed distally in the region of the mid distal gastric body. Electronically Signed   By: Narda Rutherford M.D.   On: 10/19/2022 11:05    Anti-infectives: Anti-infectives (From admission, onward)    Start     Dose/Rate Route Frequency Ordered Stop   10/16/22 2200  cefoTEtan (CEFOTAN) 2 g in sodium chloride 0.9 % 100 mL IVPB        2 g 200 mL/hr over 30 Minutes Intravenous Every 12 hours 10/16/22 1524 10/17/22 1113   10/16/22 0930  cefoTEtan (CEFOTAN) 2 g in sodium chloride 0.9 % 100 mL IVPB        2 g 200 mL/hr over 30 Minutes Intravenous On call to O.R. 10/16/22 0844 10/17/22 0659      FINAL MICROSCOPIC DIAGNOSIS:   A. COLON, RIGHT, RESECTION:       Segment of colon with terminal ileum with submucosal congestion and  serosal mesothelial reaction.       Vermiform appendix without significant diagnostic alteration.       Four lymph nodes, negative for metastatic carcinoma (0/4).       Negative for dysplasia or malignancy.   B. ADDITIONAL ILEUM, RESECTION:       Segment of small intestine without significant  diagnostic  alteration.      Focal mesenteric hemorrhage.       Negative for dysplasia or malignancy.    Assessment/Plan Cecal volvulus  POD#4 s/p Laparoscopic converted to open right hemicolectomy 10/1 Dr. Cliffton Asters - Path benign as above.  - NPO per SLP. Tolerating TF's and having bowel function. Can slowly advance TF's towards goal. Will discuss with RD.  - Mobilize, therapies - recommending CIR   Acute stroke - Per Neuro. Hgb stable at 9.9. Okay to restart Plavix.    FEN: NPO per SLP, adv TF's VTE: sq heparin ID: cefotetan 10/1>>10/2. None currently. Aferbile. WBC wnl.  Foley: Failed TOV, replaced 10/4. Good UOP.     LOS: 6 days    Jacinto Halim, PA-C  [facility] 10/20/2022, 9:20 AM Please see Amion for pager number during day hours 7:00am-4:30pm

## 2022-10-21 ENCOUNTER — Inpatient Hospital Stay (HOSPITAL_COMMUNITY): Payer: BC Managed Care – PPO

## 2022-10-21 DIAGNOSIS — K56609 Unspecified intestinal obstruction, unspecified as to partial versus complete obstruction: Secondary | ICD-10-CM | POA: Diagnosis not present

## 2022-10-21 LAB — SODIUM
Sodium: 146 mmol/L — ABNORMAL HIGH (ref 135–145)
Sodium: 143 mmol/L (ref 135–145)

## 2022-10-21 LAB — GLUCOSE, CAPILLARY
Glucose-Capillary: 93 mg/dL (ref 70–99)
Glucose-Capillary: 109 mg/dL — ABNORMAL HIGH (ref 70–99)
Glucose-Capillary: 118 mg/dL — ABNORMAL HIGH (ref 70–99)
Glucose-Capillary: 116 mg/dL — ABNORMAL HIGH (ref 70–99)
Glucose-Capillary: 111 mg/dL — ABNORMAL HIGH (ref 70–99)

## 2022-10-21 MED ORDER — GERHARDT'S BUTT CREAM
TOPICAL_CREAM | CUTANEOUS | Status: DC | PRN
  Filled 2022-10-21: qty 1

## 2022-10-21 MED ORDER — OSMOLITE 1.5 CAL PO LIQD
1000.0000 mL | ORAL | Status: DC
  Administered 2022-10-21 – 2022-10-24 (×5): 1000 mL
  Filled 2022-10-21 (×7): qty 1000

## 2022-10-21 MED ORDER — CLOPIDOGREL BISULFATE 75 MG PO TABS
75.0000 mg | ORAL_TABLET | Freq: Every day | ORAL | Status: DC
  Administered 2022-10-21 – 2022-10-22 (×2): 75 mg via NASOGASTRIC
  Filled 2022-10-21 (×2): qty 1

## 2022-10-21 NOTE — Progress Notes (Signed)
Patient ID: SIVI GOODBAR, female   DOB: 1960/06/22, 62 y.o.   MRN: [id number] [facility] Surgery Progress Note  5 Days Post-Op  Subjective: CC-  Reports she only has abdominal pain on the left side when she coughs. No abdominal pain at rest. Tolerating tf's. NPO per SLP. Passing flatus. Having BM's. Her husband is at bedside.   Objective: Vital signs in last 24 hours: Temp:  [97.8 F (36.6 C)-98.2 F (36.8 C)] 97.8 F (36.6 C) (10/06 0727) Pulse Rate:  [72-96] 86 (10/06 0600) Resp:  [15-31] 17 (10/06 0600) BP: (137-163)/(74-105) 142/92 (10/06 0600) SpO2:  [88 %-97 %] 90 % (10/06 0600) Last BM Date : 10/20/22  Intake/Output from previous day: 10/05 0701 - 10/06 0700 In: 582.2 [I.V.:122.2; NG/GT:460] Out: 1225 [Urine:1150; Stool:75] Intake/Output this shift: No intake/output data recorded.  PE: General: Alert, NAD Abd: soft, ND, +BS, appropriately tender over incision, honeycomb to midline with some dried blood at inferior aspect but no evidence of active drainage.   Lab Results:  Recent Labs    10/19/22 0836 10/20/22 0652  WBC 5.5 4.2  HGB 9.9* 9.9*  HCT 30.7* 30.6*  PLT 136* 129*   BMET Recent Labs    10/18/22 0957 10/18/22 1642 10/20/22 0652 10/20/22 1247 10/20/22 1833 10/21/22 0147  NA 153*   < > 150*   < > 147* 146*  K 3.6  --  3.3*  --   --   --   CL 121*  --  112*  --   --   --   CO2 24  --  25  --   --   --   GLUCOSE 107*  --  130*  --   --   --   BUN 21  --  14  --   --   --   CREATININE 0.89  --  0.78  --   --   --   CALCIUM 8.5*  --  8.0*  --   --   --    < > = values in this interval not displayed.   PT/INR No results for input(s): "LABPROT", "INR" in the last 72 hours. CMP     Component Value Date/Time   NA 146 (H) 10/21/2022 0147   K 3.3 (L) 10/20/2022 0652   CL 112 (H) 10/20/2022 0652   CO2 25 10/20/2022 0652   GLUCOSE 130 (H) 10/20/2022 0652   BUN 14 10/20/2022 0652   CREATININE 0.78 10/20/2022 0652   CREATININE 0.91  09/02/2015 1352   CALCIUM 8.0 (L) 10/20/2022 0652   PROT 6.0 (L) 10/18/2022 0957   ALBUMIN 2.8 (L) 10/18/2022 0957   AST 16 10/18/2022 0957   ALT 17 10/18/2022 0957   ALKPHOS 56 10/18/2022 0957   BILITOT 0.5 10/18/2022 0957   GFRNONAA >60 10/20/2022 0652   GFRAA  12/16/2009 2144    >60        The eGFR has been calculated using the MDRD equation. This calculation has not been validated in all clinical situations. eGFR's persistently <60 mL/min signify possible Chronic Kidney Disease.   Lipase     Component Value Date/Time   LIPASE 10 (L) 10/13/2022 2116       Studies/Results: CT HEAD WO CONTRAST ( )  Result Date: 10/19/2022 CLINICAL DATA:  Stroke, follow-up. EXAM: CT HEAD WITHOUT CONTRAST TECHNIQUE: Contiguous axial images were obtained from the base of the skull through the vertex without intravenous contrast. RADIATION DOSE REDUCTION: This exam was performed according to the  departmental dose-optimization program which includes automated exposure control, adjustment of the mA and/or kV according to patient size and/or use of iterative reconstruction technique. COMPARISON:  CT head without contrast 10/17/2022. MR head without contrast 10/15/2022. FINDINGS: Brain: A left anterior inferior cerebellar hypodense nonhemorrhagic infarct is stable in size. Local mass effect is present with effacement of the sulci. There is some mass effect on the fourth ventricle without obstruction. The supratentorial brain is within normal limits. Deep brain nuclei are within normal limits. The ventricles are of normal size. No significant extraaxial fluid collection is present. Midline structures are within normal limits. Vascular: No hyperdense vessel or unexpected calcification. Skull: Calvarium is intact. No focal lytic or blastic lesions are present. No significant extracranial soft tissue lesion is present. Sinuses/Orbits: Bilateral lens replacements are noted. Globes and orbits are otherwise  unremarkable. The fluid level is present in the right maxillary sinus. Anterior right ethmoid air cells are opacified. The paranasal sinuses and mastoid air cells are otherwise clear. IMPRESSION: 1. Stable size of left anterior inferior cerebellar hypodense nonhemorrhagic infarct. 2. Local mass effect with effacement of the sulci. There is some mass effect on the fourth ventricle without obstruction. 3. No other acute or focal lesion to explain the patient's symptoms. Electronically Signed   By: Marin Roberts M.D.   On: 10/19/2022 14:46   DG Abd 1 View  Result Date: 10/19/2022 CLINICAL DATA:  Feeding tube placement. EXAM: ABDOMEN - 1 VIEW COMPARISON:  Radiograph 10/15/2022 FINDINGS: Weighted enteric tube in place. The tube is looped in the stomach with tip directed distally in the region of the mid distal gastric body. Decreased gaseous bowel distention in the upper abdomen. IMPRESSION: Weighted enteric tube looped in the stomach with tip directed distally in the region of the mid distal gastric body. Electronically Signed   By: Narda Rutherford M.D.   On: 10/19/2022 11:05    Anti-infectives: Anti-infectives (From admission, onward)    Start     Dose/Rate Route Frequency Ordered Stop   10/16/22 2200  cefoTEtan (CEFOTAN) 2 g in sodium chloride 0.9 % 100 mL IVPB        2 g 200 mL/hr over 30 Minutes Intravenous Every 12 hours 10/16/22 1524 10/17/22 1113   10/16/22 0930  cefoTEtan (CEFOTAN) 2 g in sodium chloride 0.9 % 100 mL IVPB        2 g 200 mL/hr over 30 Minutes Intravenous On call to O.R. 10/16/22 0844 10/17/22 0659      FINAL MICROSCOPIC DIAGNOSIS:   A. COLON, RIGHT, RESECTION:       Segment of colon with terminal ileum with submucosal congestion and  serosal mesothelial reaction.       Vermiform appendix without significant diagnostic alteration.       Four lymph nodes, negative for metastatic carcinoma (0/4).       Negative for dysplasia or malignancy.   B. ADDITIONAL ILEUM,  RESECTION:       Segment of small intestine without significant diagnostic  alteration.      Focal mesenteric hemorrhage.       Negative for dysplasia or malignancy.    Assessment/Plan Cecal volvulus  POD#4 s/p Laparoscopic converted to open right hemicolectomy 10/1 Dr. Cliffton Asters - Path benign as above.  - NPO per SLP. Tolerating TF's and having bowel function. Can advance TF's towards goal. Will discuss with RD.  - Mobilize, therapies - recommending CIR   Acute stroke - Per Neuro. Hgb stable at 9.9 on yesterday's labs. Okay  to restart Plavix.    FEN: NPO per SLP, adv TF's VTE: sq heparin. Okay for Plavix ID: cefotetan 10/1>>10/2. None currently. Aferbile. WBC wnl on last check.  Foley: Failed TOV, replaced 10/4. Good UOP.     LOS: 7 days    Jacinto Halim, [facility] Surgery 10/21/2022, 8:33 AM Please see Amion for pager number during day hours 7:00am-4:30pm

## 2022-10-21 NOTE — Progress Notes (Addendum)
STROKE TEAM PROGRESS NOTE   INTERVAL HISTORY Daughter is at bedside. Doing well.   Tolerating Tfs, still at same rate as yesterday.    Vitals:   10/21/22 0800 10/21/22 0900 10/21/22 1000 10/21/22 1100  BP: (!) 158/106 (!) 155/107 (!) 158/102 (!) 137/91  Pulse: 87 94 92 93  Resp: 20 (!) 24 20 19   Temp:      TempSrc:      SpO2: 91% 92% 91% 90%  Weight:      Height:       CBC:  Recent Labs  Lab 10/19/22 0836 10/20/22 0652  WBC 5.5 4.2  HGB 9.9* 9.9*  HCT 30.7* 30.6*  MCV 93.6 92.7  PLT 136* 129*   Basic Metabolic Panel:  Recent Labs  Lab 10/18/22 0957 10/18/22 1642 10/20/22 0652 10/20/22 1247 10/21/22 0147 10/21/22 0757  NA 153*   < > 150*   < > 146* 143  K 3.6  --  3.3*  --   --   --   CL 121*  --  112*  --   --   --   CO2 24  --  25  --   --   --   GLUCOSE 107*  --  130*  --   --   --   BUN 21  --  14  --   --   --   CREATININE 0.89  --  0.78  --   --   --   CALCIUM 8.5*  --  8.0*  --   --   --    < > = values in this interval not displayed.   Lipid Panel:  Recent Labs  Lab 10/15/22 0228  CHOL 142  TRIG 60  HDL 76  CHOLHDL 1.9  VLDL 12  LDLCALC 54   HgbA1c:  Recent Labs  Lab 10/15/22 0228  HGBA1C 5.6    IMAGING past 24 hours No results found.  PHYSICAL EXAM Physical Exam  Constitutional: Appears well-developed and well-nourished. Laying in bed. Psych: Affect appropriate to situation HENT: Normocephalic. NG tube in place.  Musculoskeletal-no joint tenderness, deformity or swelling Cardiovascular: Normal rate and regular rhythm.  Respiratory: Effort normal, non-labored GI: Soft.  No distension. There is no tenderness.  Skin: WDI   Neuro:   MS: alert, awake. Oriented to person, place time.  Follows all commands, no aphasia. Dysarthria present.  CN: PERRL, EOMI. Left facial droop severe.  Tongue midline. No visual field cut.  Motor: able to lift all extemities but drift in left arm/leg.  Ataxia: present in left arm/leg.  Sensation:  intact.     ASSESSMENT/PLAN Joan Robinson is a 62 y.o. female with past medical history of esophageal strictures status post dilation, multiple hernias status post multiple surgeries, GERD/PUD, hypertension, hyperlipidemia, autoimmune urticaria, emphysema, sleep apnea who presents to the ED with right upper quadrant and right lower quadrant abdominal pain x 2 days associated nausea.  She was found to have small bowel obstruction and was being managed inpatient at [facility].  Then, noted to have left facial droop, slurring of her speech, and CT head without contrast which was notable for a moderately large left PICA territory infarct. CT angio head and neck with and without contrast was negative for any large vessel occlusion.  Specifically, no basilar occlusion or thrombosis noted.  CT perfusion with no core or mismatch noted.  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 Confluent cytotoxic edema but no hemorrhagic transformation, no interval extension, and no significant posterior fossa mass effect. CTA head & neck left AICA not visualized Ct perfusion: no core MRI Large acute infarct (affecting both the left PICA and left AICA vascular territories). Posterior fossa mass effect with effacement of the fourth ventricle inferiorly. No evidence of obstructive hydrocephalus at this time.  CT repeat 10/2 showed unchanged appearance of left cerebellar infarct.  No acute hemorrhage. Repeat CT 10/4-stable 2D Echo EF 60-65% LDL 54 HgbA1c 5.6 VTE prophylaxis - heparin subcutaneous  No antithrombotic prior to admission, con' t Plavix. ASA allergy Therapy recommendations:  CIR Disposition:  pending  Cerebellar edema Mild mass effect to compress on the brainstem Risk of developing hydrocephalus On 3% saline @75 ->NS @75 ->3% saline @ 25 Need close clinical and imaging  monitoring CT repeat 10/2 stable infarct, no hemorrhage, no hydrocephalus CT repeat stable, will  taper off 3% Sodium 150-153-154-156-153-152->148>150>143 Sodium check every 6 hours  SBO Pt admitted for abdominal pain x 2 days with N/V Found to have SBO, may related to her previous multiple surgeries for hernia Surgery on board Now NPO with NG suction KUB - Dilated loops of large bowel seen in the right upper quadrant and multiple dilated loops of small bowel, 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 CT abd/pelvis progression of SBO Status post surgery treatment 10/1   Hypertension Home meds:  none Stable Long-term BP goal normotensive  Hyperlipidemia Home meds:  crestor 20mg   LDL 54, goal < 70 Con't  home crestor 20  Continue statin at discharge  FEN:  TF @20 . Increase to 30 now with goal of 60. Move by 10 q 6hrs.  Reeval swallow tomorrow.  Other Stroke Risk Factors Sleep apnea, not on CPAP at home  Other Active Problems Esophageal stricture s/p dilation  PUD multiple hernias status post multiple surgeries  GERD Autoimmune urticaria  Emphysema   Hospital day # 7  Off 3%. Transfer to floor bed.    MDM: Moderate. Pertinent labs, imaging results reviewed by me and considered in my decision making. Independently reviewed imaging. Medical records reviewed. Discussed the patient with another medical provider/personnel. Obtained history from someone other than the patient.    Perrie Ragin,MD   To contact Stroke Continuity provider, please refer to [email or web address]. After hours, contact General Neurology

## 2022-10-21 NOTE — Progress Notes (Signed)
Pt transferred to 2W05 without complications. Family at bedside.

## 2022-10-22 ENCOUNTER — Inpatient Hospital Stay (HOSPITAL_COMMUNITY): Payer: BC Managed Care – PPO

## 2022-10-22 DIAGNOSIS — K56609 Unspecified intestinal obstruction, unspecified as to partial versus complete obstruction: Secondary | ICD-10-CM | POA: Diagnosis not present

## 2022-10-22 LAB — CBC WITH DIFFERENTIAL/PLATELET
Abs Immature Granulocytes: 0.09 10*3/uL — ABNORMAL HIGH (ref 0.00–0.07)
Basophils Absolute: 0 10*3/uL (ref 0.0–0.1)
Basophils Relative: 0 %
Eosinophils Absolute: 0.2 10*3/uL (ref 0.0–0.5)
Eosinophils Relative: 3 %
HCT: 38.4 % (ref 36.0–46.0)
Hemoglobin: 12.9 g/dL (ref 12.0–15.0)
Immature Granulocytes: 2 %
Lymphocytes Relative: 19 %
Lymphs Abs: 1.1 10*3/uL (ref 0.7–4.0)
MCH: 29.7 pg (ref 26.0–34.0)
MCHC: 33.6 g/dL (ref 30.0–36.0)
MCV: 88.5 fL (ref 80.0–100.0)
Monocytes Absolute: 0.6 10*3/uL (ref 0.1–1.0)
Monocytes Relative: 11 %
Neutro Abs: 3.7 10*3/uL (ref 1.7–7.7)
Neutrophils Relative %: 65 %
Platelets: 193 10*3/uL (ref 150–400)
RBC: 4.34 MIL/uL (ref 3.87–5.11)
RDW: 12.5 % (ref 11.5–15.5)
WBC: 5.7 10*3/uL (ref 4.0–10.5)
nRBC: 0 % (ref 0.0–0.2)

## 2022-10-22 LAB — COMPREHENSIVE METABOLIC PANEL
ALT: 26 U/L (ref 0–44)
AST: 26 U/L (ref 15–41)
Albumin: 3 g/dL — ABNORMAL LOW (ref 3.5–5.0)
Alkaline Phosphatase: 85 U/L (ref 38–126)
Anion gap: 14 (ref 5–15)
BUN: 12 mg/dL (ref 8–23)
CO2: 24 mmol/L (ref 22–32)
Calcium: 8.7 mg/dL — ABNORMAL LOW (ref 8.9–10.3)
Chloride: 102 mmol/L (ref 98–111)
Creatinine, Ser: 0.7 mg/dL (ref 0.44–1.00)
GFR, Estimated: >60 mL/min (ref >60)
Glucose, Bld: 116 mg/dL — ABNORMAL HIGH (ref 70–99)
Potassium: 3.5 mmol/L (ref 3.5–5.1)
Sodium: 140 mmol/L (ref 135–145)
Total Bilirubin: 0.6 mg/dL (ref 0.3–1.2)
Total Protein: 6.7 g/dL (ref 6.5–8.1)

## 2022-10-22 LAB — GLUCOSE, CAPILLARY
Glucose-Capillary: 111 mg/dL — ABNORMAL HIGH (ref 70–99)
Glucose-Capillary: 114 mg/dL — ABNORMAL HIGH (ref 70–99)
Glucose-Capillary: 131 mg/dL — ABNORMAL HIGH (ref 70–99)
Glucose-Capillary: 133 mg/dL — ABNORMAL HIGH (ref 70–99)
Glucose-Capillary: 170 mg/dL — ABNORMAL HIGH (ref 70–99)

## 2022-10-22 LAB — PHOSPHORUS: Phosphorus: 3.2 mg/dL (ref 2.5–4.6)

## 2022-10-22 LAB — MAGNESIUM: Magnesium: 1.9 mg/dL (ref 1.7–2.4)

## 2022-10-22 LAB — SURGICAL PATHOLOGY

## 2022-10-22 MED ORDER — POLYVINYL ALCOHOL 1.4 % OP SOLN
2.0000 [drp] | Freq: Four times a day (QID) | OPHTHALMIC | Status: DC
  Administered 2022-10-22 – 2022-10-23 (×6): 2 [drp] via OPHTHALMIC
  Filled 2022-10-22: qty 15

## 2022-10-22 MED ORDER — CALCIUM POLYCARBOPHIL 625 MG PO TABS
625.0000 mg | ORAL_TABLET | Freq: Every day | ORAL | Status: DC
  Administered 2022-10-23 – 2022-10-25 (×3): 625 mg via ORAL
  Filled 2022-10-22 (×3): qty 1

## 2022-10-22 MED ORDER — ROSUVASTATIN CALCIUM 20 MG PO TABS
20.0000 mg | ORAL_TABLET | Freq: Every day | ORAL | Status: DC
  Administered 2022-10-23 – 2022-10-25 (×3): 20 mg via ORAL
  Filled 2022-10-22 (×3): qty 1

## 2022-10-22 MED ORDER — CALCIUM POLYCARBOPHIL 625 MG PO TABS
625.0000 mg | ORAL_TABLET | Freq: Every day | ORAL | Status: DC
  Administered 2022-10-22: 625 mg
  Filled 2022-10-22: qty 1

## 2022-10-22 MED ORDER — MELATONIN 5 MG PO TABS
10.0000 mg | ORAL_TABLET | Freq: Every day | ORAL | Status: DC
  Administered 2022-10-22: 10 mg
  Filled 2022-10-22: qty 2

## 2022-10-22 MED ORDER — ARTIFICIAL TEARS OPHTHALMIC OINT
TOPICAL_OINTMENT | Freq: Every day | OPHTHALMIC | Status: DC
  Administered 2022-10-23: 1 via OPHTHALMIC
  Filled 2022-10-22: qty 3.5

## 2022-10-22 MED ORDER — HYDROCODONE-ACETAMINOPHEN 5-325 MG PO TABS
1.0000 | ORAL_TABLET | Freq: Every day | ORAL | Status: DC
  Administered 2022-10-22: 1
  Filled 2022-10-22: qty 1

## 2022-10-22 MED ORDER — LOPERAMIDE HCL 2 MG PO CAPS: 2.0000 mg | ORAL_CAPSULE | ORAL | Status: DC | PRN

## 2022-10-22 MED ORDER — FOOD THICKENER (SIMPLYTHICK HONEY): 10.0000 | ORAL | Status: DC | PRN

## 2022-10-22 MED ORDER — LOPERAMIDE HCL 1 MG/7.5ML PO SUSP
2.0000 mg | Freq: Once | ORAL | Status: AC
  Administered 2022-10-22: 2 mg
  Filled 2022-10-22: qty 15

## 2022-10-22 MED ORDER — LEVOTHYROXINE SODIUM 88 MCG PO TABS
88.0000 ug | ORAL_TABLET | Freq: Every day | ORAL | Status: DC
  Administered 2022-10-23 – 2022-10-25 (×3): 88 ug via ORAL
  Filled 2022-10-22 (×3): qty 1

## 2022-10-22 MED ORDER — METHOCARBAMOL 500 MG PO TABS
500.0000 mg | ORAL_TABLET | Freq: Four times a day (QID) | ORAL | Status: DC
  Administered 2022-10-22 – 2022-10-25 (×10): 500 mg via ORAL
  Filled 2022-10-22 (×14): qty 1

## 2022-10-22 MED ORDER — CLOPIDOGREL BISULFATE 75 MG PO TABS
75.0000 mg | ORAL_TABLET | Freq: Every day | ORAL | Status: DC
  Administered 2022-10-23 – 2022-10-25 (×3): 75 mg via ORAL
  Filled 2022-10-22 (×3): qty 1

## 2022-10-22 NOTE — Progress Notes (Signed)
Occupational Therapy Treatment Patient Details Name: Joan Robinson MRN: [id number] DOB: October 17, 1960 Today's Date: 10/22/2022   History of present illness 62 yo admitted 9/28 with RUQ pain with SBO.  9/29 facial droop, head CT: left posteroinferior cerebellar artery territory infarction. 9/30 MRI: Large acute infarct affecting both the left PICA and left AICA. 10/1 Rt hemicolectomy. PMhx: HTN, HLD, peptic ulcer disease, diverticulitis, bipolar disorder, hypothyroidism, GERD, Hashimoto's disease.   OT comments  Pt is making solid progress towards their acute OT goals. Pt seen with PT to safely progress functional mobility and OOB ADLs. Pt was limited by activity tolerance with apparent fatigue throughout, she needed constant cues for attention and arousal. Physically, she needed min A for bed mobility and min-mod A+2 for transfers and ambulation with RW. She also required maximal cues during all tasks for L attention, body awareness and sequencing. Pt easily distracted internally and externally and benefits from a non-distracting environment. OT to continue to follow acutely to facilitate progress towards established goals. Pt will continue to benefit from intensive inpatient follow up therapy, >3 hours/day after discharge.       If plan is discharge home, recommend the following:  Two people to help with walking and/or transfers;A lot of help with bathing/dressing/bathroom;Assistance with cooking/housework;Assistance with feeding;Direct supervision/assist for financial management;Direct supervision/assist for medications management;Assist for transportation;Help with stairs or ramp for entrance   Equipment Recommendations  Other (comment)    Recommendations for Other Services Rehab consult    Precautions / Restrictions Precautions Precautions: Fall;Other (comment) Precaution Comments: incontinent loose stool Restrictions Weight Bearing Restrictions: No       Mobility Bed Mobility Overal  bed mobility: Needs Assistance Bed Mobility: Supine to Sit, Rolling, Sit to Supine Rolling: Mod assist   Supine to sit: Min assist Sit to supine: Min assist   General bed mobility comments: min A to bring LEs to and off EOB and elevate trunk, increased time to initiate mobility, min A +2 to return to supine at end of session    Transfers Overall transfer level: Needs assistance Equipment used: Rolling walker (2 wheels) Transfers: Sit to/from Stand, Bed to chair/wheelchair/BSC Sit to Stand: +2 physical assistance, Min assist, Mod assist           General transfer comment: min A +2 to rise from EOB, mod A +2 to rise from standard chair. maximal cues needed for L inattention and L body mechanics     Balance Overall balance assessment: Needs assistance Sitting-balance support: Feet unsupported, Bilateral upper extremity supported Sitting balance-Leahy Scale: Poor     Standing balance support: Bilateral upper extremity supported, During functional activity, Reliant on assistive device for balance Standing balance-Leahy Scale: Poor                             ADL either performed or assessed with clinical judgement   ADL Overall ADL's : Needs assistance/impaired                     Lower Body Dressing: Maximal assistance;+2 for physical assistance;+2 for safety/equipment;Sit to/from stand Lower Body Dressing Details (indicate cue type and reason): maximal cues for attention and physical assist Toilet Transfer: Moderate assistance;+2 for physical assistance;+2 for safety/equipment;Ambulation;Regular Toilet;Rolling walker (2 wheels)   Toileting- Clothing Manipulation and Hygiene: Total assistance;+2 for physical assistance;+2 for safety/equipment;Sit to/from stand       Functional mobility during ADLs: Moderate assistance;+2 for safety/equipment;+2 for physical assistance General  ADL Comments: continues to be limited by L hemi weakness, L inattention,  impiared cognition. Improved mobility with needed min-modA +2 for transfers and mobiltiy    Extremity/Trunk Assessment Upper Extremity Assessment Upper Extremity Assessment: LUE deficits/detail LUE Deficits / Details: 3/5, inattention to the L. poor functional use, needs maximal cues. Able to sustain a grasp on RW LUE Sensation: decreased proprioception;decreased light touch LUE Coordination: decreased fine motor;decreased gross motor   Lower Extremity Assessment Lower Extremity Assessment: Defer to PT evaluation        Vision   Vision Assessment?: Yes Eye Alignment: Within Functional Limits Tracking/Visual Pursuits: Right eye does not track medially;Left eye does not track laterally Visual Fields: Left visual field deficit Additional Comments: L inattention, L eye lid does not blink, limited tracking with L eye. eye extremely red. May benefit form a patch. pt states she is seeing red floaters from the L eye   Perception Perception Perception: Impaired Preception Impairment Details: Inattention/Neglect   Praxis Praxis Praxis: Impaired Praxis Impairment Details: Initiation;Motor planning;Organization    Cognition Arousal: Alert, Lethargic Behavior During Therapy: Flat affect Overall Cognitive Status: Impaired/Different from baseline Area of Impairment: Attention, Following commands, Problem solving, Safety/judgement                 Orientation Level: Time, Disoriented to Current Attention Level: Sustained   Following Commands: Follows one step commands consistently, Follows one step commands with increased time Safety/Judgement: Decreased awareness of safety, Decreased awareness of deficits   Problem Solving: Difficulty sequencing, Requires tactile cues, Requires verbal cues General Comments: pt easily distractible, needing cues to remain on task, pt with intermittent lethargy throughout session, needing cues to keep eyes open. benefits from a non-distracting  environment              General Comments VSS on RA until supine then SpO2 dropped to 89%. [place] replaced.    Pertinent Vitals/ Pain       Pain Assessment Pain Assessment: Faces Faces Pain Scale: Hurts little more Pain Location: bottom with peri-care Pain Descriptors / Indicators: Sore, Discomfort, Tender Pain Intervention(s): Limited activity within patient's tolerance, Monitored during session   Frequency  Min 1X/week        Progress Toward Goals  OT Goals(current goals can now be found in the care plan section)  Progress towards OT goals: Progressing toward goals  Acute Rehab OT Goals Patient Stated Goal: to rest OT Goal Formulation: With patient Time For Goal Achievement: 10/29/22 Potential to Achieve Goals: Good ADL Goals Pt Will Perform Grooming: with set-up;sitting Pt Will Perform Upper Body Dressing: with set-up;sitting Pt Will Perform Lower Body Dressing: with min assist;sit to/from stand Pt Will Transfer to Toilet: with min assist;ambulating Additional ADL Goal #1: Pt will use low vision compenstory techniques to safety navigate hosptial environment  Plan      Co-evaluation    PT/OT/SLP Co-Evaluation/Treatment: Yes Reason for Co-Treatment: Complexity of the patient's impairments (multi-system involvement);For patient/therapist safety PT goals addressed during session: Mobility/safety with mobility;Proper use of DME;Balance OT goals addressed during session: ADL's and self-care;Strengthening/ROM      AM-PAC OT "6 Clicks" Daily Activity     Outcome Measure   Help from another person eating meals?: Total Help from another person taking care of personal grooming?: A Little Help from another person toileting, which includes using toliet, bedpan, or urinal?: A Lot Help from another person bathing (including washing, rinsing, drying)?: A Lot Help from another person to put on and taking off regular upper body  clothing?: A Lot Help from another person to put  on and taking off regular lower body clothing?: A Lot 6 Click Score: 12    End of Session Equipment Utilized During Treatment: Gait belt;Rolling walker (2 wheels);Oxygen  OT Visit Diagnosis: Unsteadiness on feet (R26.81);Other abnormalities of gait and mobility (R26.89);Muscle weakness (generalized) (M62.81);Low vision, both eyes (H54.2);Other symptoms and signs involving cognitive function   Activity Tolerance Patient tolerated treatment well   Patient Left in bed;with call bell/phone within reach;with bed alarm set;with family/visitor present   Nurse Communication Mobility status        Time: 8295-6213 OT Time Calculation (min): 31 min  Charges: OT General Charges $OT Visit: 1 Visit OT Treatments $Self Care/Home Management : 8-22 mins  Derenda Mis, OTR/L Acute Rehabilitation Services Office [phone] Secure Chat Communication Preferred   Donia Pounds 10/22/2022, 11:42 AM

## 2022-10-22 NOTE — Progress Notes (Signed)
Modified Barium Swallow Study  Patient Details  Name: Joan Robinson MRN: [id number] Date of Birth: 1960-08-31  Today's Date: 10/22/2022  Modified Barium Swallow completed.  Full report located under Chart Review in the Imaging Section.  History of Present Illness 62 yo admitted 9/28 with RUQ pain with SBO.  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 PMhx: HTN, HLD, peptic ulcer disease, diverticulitis, bipolar disorder, hypothyroidism, GERD, Hashimoto's disease. Hemicolectomy completed on 10/1.   Clinical Impression Pt presents with a neurogenic dysphagia. Oral phase is c/b poor bolus awareness and control with anterior loss from right side as well as posterior spillage into pharynx.  When pt was cued to actively accept spoon into mouth and seal her lips, she demonstrated improved control.  There was intermittent spilling of trace amounts of thin and nectar thick liquid into the airway - either before the onset of the pharyngeal or from residue after - but aspiration did not elicit a cough response.  There was adequate laryngeal mobility.  There was minimal residue overall.  Given concern at this time for silent aspiration, recommend starting conservative diet of dysphagia 1/honey thick liquids.  Eating will require great effort and concentration for Joan Robinson  at this time, so I would not anticipate ample PO intake. Recommend continuing TF. SLP will follow for dysphagia tx. Factors that may increase risk of adverse event in presence of aspiration Joan Robinson & Joan Robinson 2021): Weak cough  Swallow Evaluation Recommendations Recommendations: PO diet PO Diet Recommendation: Dysphagia 1 (Pureed);Moderately thick liquids (Level 3, honey thick) Liquid Administration via: Cup;Spoon Medication Administration: Crushed with puree Supervision: Staff to assist with self-feeding Swallowing strategies  : Slow rate;Check for pocketing or  oral holding;Check for anterior loss Oral care recommendations: Oral care BID (2x/day)    Joan Enck L. Samson Frederic, MA CCC/SLP Clinical Specialist - Acute Care SLP Acute Rehabilitation Services Office number [phone]   Joan Robinson 10/22/2022,3:34 PM

## 2022-10-22 NOTE — Progress Notes (Signed)
Speech Language Pathology Treatment: Dysphagia  Patient Details Name: DEMAYA Robinson MRN: [id number] DOB: Jan 31, 1960 Today's Date: 10/22/2022 Time: 7829-5621 SLP Time Calculation (min) (ACUTE ONLY): 19 min  Assessment / Plan / Recommendation Clinical Impression  Joan Robinson is much more alert and participatory since last seen by SLP.  Continues with persisting s/s of dysphagia with anterior loss of ice/water, the presentation of likely swallow delay, and consistent coughing after all PO trials.  She requires occasional verbal cues to follow instructions surrounding swallowing. Daughter at bedside - we discussed the benefit of pursuing an MBS to assess physiology of swallowing and develop a plan for treatment. Will proceed with study this afternoon. Pt/dtr agree.    HPI HPI: 62 yo admitted 9/28 with RUQ pain with SBO.  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 PMhx: HTN, HLD, peptic ulcer disease, diverticulitis, bipolar disorder, hypothyroidism, GERD, Hashimoto's disease. Hemicolectomy completed on 10/1.      SLP Plan  MBS      Recommendations for follow up therapy are one component of a multi-disciplinary discharge planning process, led by the attending physician.  Recommendations may be updated based on patient status, additional functional criteria and insurance authorization.    Recommendations  Diet recommendations: NPO (may have occasional ice chips) Medication Administration: Via alternative means                  Oral care prior to ice chip/H20   Frequent or constant Supervision/Assistance Dysphagia, oropharyngeal phase (R13.12)     MBS   Joan Robinson L. Samson Frederic, MA CCC/SLP Clinical Specialist - Acute Care SLP Acute Rehabilitation Services Office number [phone]   Joan Robinson Joan Robinson  10/22/2022, 2:10 PM

## 2022-10-22 NOTE — Progress Notes (Signed)
Physical Therapy Treatment Patient Details Name: Joan Robinson MRN: [id number] DOB: 10/21/1960 Today's Date: 10/22/2022   History of Present Illness 62 yo admitted 9/28 with RUQ pain with SBO.  9/29 facial droop, head CT: left posteroinferior cerebellar artery territory infarction. 9/30 MRI: Large acute infarct affecting both the left PICA and left AICA. 10/1 Rt hemicolectomy. PMhx: HTN, HLD, peptic ulcer disease, diverticulitis, bipolar disorder, hypothyroidism, GERD, Hashimoto's disease.    PT Comments  Pt greeted resting in bed and agreeable to session with continued progress towards acute goals. Pt easily distractible throughout session needing increased cues to remain on task and to remain alert as pt with increased lethargy throughout. Pt continues to demonstrate L lateral lean in sitting, standing and during gait needing up to max A +2 to progress gait in room with RW support. Pt able to find midline sitting with increased time and R propped sitting x2. Pt daughter present and supportive throughout session. Pt continues to benefit from skilled PT services to progress toward functional mobility goals.      If plan is discharge home, recommend the following: A lot of help with walking and/or transfers;A lot of help with bathing/dressing/bathroom;Assistance with cooking/housework;Direct supervision/assist for medications management;Assist for transportation;Supervision due to cognitive status;Help with stairs or ramp for entrance;Direct supervision/assist for financial management   Can travel by private vehicle        Equipment Recommendations  None recommended by PT    Recommendations for Other Services Rehab consult     Precautions / Restrictions Precautions Precautions: Fall;Other (comment) Precaution Comments: watch sats Restrictions Weight Bearing Restrictions: No     Mobility  Bed Mobility Overal bed mobility: Needs Assistance Bed Mobility: Supine to Sit, Rolling, Sit to  Supine Rolling: Mod assist   Supine to sit: Min assist Sit to supine: Min assist   General bed mobility comments: min A to bring LEs to and off EOB and elevate trunk, increased time to initiate mobility, min A +2 to return to supine at end of session    Transfers Overall transfer level: Needs assistance Equipment used: Rolling walker (2 wheels) Transfers: Sit to/from Stand, Bed to chair/wheelchair/BSC Sit to Stand: +2 physical assistance, Min assist, Mod assist           General transfer comment: min A +2 to rise from EOB, mod A +2 to rise from standard chair, cues for hand placement and physical assist needed to rise    Ambulation/Gait Ambulation/Gait assistance: +2 physical assistance, Max assist Gait Distance (Feet): 20 Feet (x2) Assistive device: Rolling walker (2 wheels) Gait Pattern/deviations: Step-to pattern, Trunk flexed, Ataxic, Narrow base of support, Decreased weight shift to right, Decreased stance time - right       General Gait Details: pt with continued mod L lean needing asssit to correct, pt needing hands on asssit with LLE as pt contineus to adduct and cross midline with each step   Stairs             Wheelchair Mobility     Tilt Bed    Modified Rankin (Stroke Patients Only) Modified Rankin (Stroke Patients Only) Pre-Morbid Rankin Score: No symptoms Modified Rankin: Severe disability     Balance Overall balance assessment: Needs assistance Sitting-balance support: Feet unsupported, Bilateral upper extremity supported Sitting balance-Leahy Scale: Poor Sitting balance - Comments: UB support EOB needing assist at times due to left lean and poor postural stability as she scootted to EOB Postural control: Left lateral lean Standing balance support: Bilateral upper extremity  supported, During functional activity, Reliant on assistive device for balance Standing balance-Leahy Scale: Poor Standing balance comment: UB support on Rollator with max  +2 assist for balance                            Cognition Arousal: Alert, Lethargic Behavior During Therapy: Flat affect Overall Cognitive Status: Impaired/Different from baseline Area of Impairment: Attention, Following commands, Problem solving, Safety/judgement                 Orientation Level: Time, Disoriented to Current Attention Level: Sustained   Following Commands: Follows one step commands consistently, Follows one step commands with increased time Safety/Judgement: Decreased awareness of safety, Decreased awareness of deficits   Problem Solving: Difficulty sequencing, Requires tactile cues, Requires verbal cues General Comments: pt easily distractible, needing cues to remain on task, pt with intermittent lethargy throughout session, needing cues to keep eyes open        Exercises General Exercises - Lower Extremity Ankle Circles/Pumps: AROM, Both, 10 reps, Supine Long Arc Quad: AROM, Right, Left, Seated, 15 reps Heel Slides: AROM, Right, Left, 10 reps, Supine Hip ABduction/ADduction: AROM, Both, 10 reps, Supine    General Comments General comments (skin integrity, edema, etc.): VSS on RA until supine then SpO2 dropped to 89%. [place] replaced.      Pertinent Vitals/Pain Pain Assessment Pain Assessment: Faces Faces Pain Scale: Hurts little more Pain Location: bottom with peri-care Pain Descriptors / Indicators: Sore, Discomfort, Tender Pain Intervention(s): Monitored during session, Limited activity within patient's tolerance, Repositioned    Home Living                          Prior Function            PT Goals (current goals can now be found in the care plan section) Acute Rehab PT Goals PT Goal Formulation: With family Time For Goal Achievement: 10/29/22 Potential to Achieve Goals: Good Progress towards PT goals: Progressing toward goals    Frequency    Min 1X/week      PT Plan      Co-evaluation PT/OT/SLP  Co-Evaluation/Treatment: Yes Reason for Co-Treatment: Complexity of the patient's impairments (multi-system involvement);For patient/therapist safety PT goals addressed during session: Mobility/safety with mobility;Proper use of DME;Balance OT goals addressed during session: ADL's and self-care;Strengthening/ROM      AM-PAC PT "6 Clicks" Mobility   Outcome Measure  Help needed turning from your back to your side while in a flat bed without using bedrails?: A Little Help needed moving from lying on your back to sitting on the side of a flat bed without using bedrails?: A Little Help needed moving to and from a bed to a chair (including a wheelchair)?: A Lot Help needed standing up from a chair using your arms (e.g., wheelchair or bedside chair)?: Total Help needed to walk in hospital room?: Total Help needed climbing 3-5 steps with a railing? : Total 6 Click Score: 11    End of Session Equipment Utilized During Treatment: Oxygen;Gait belt Activity Tolerance: Patient limited by fatigue Patient left: with call bell/phone within reach;in bed;with family/visitor present Nurse Communication: Mobility status;Other (comment) (left coretrack off as pt with HOB <30*) PT Visit Diagnosis: Other abnormalities of gait and mobility (R26.89);Unsteadiness on feet (R26.81);Other symptoms and signs involving the nervous system (R29.898)     Time: 1308-6578 PT Time Calculation (min) (ACUTE ONLY): 30 min  Charges:    $  Gait Training: 8-22 mins PT General Charges $$ ACUTE PT VISIT: 1 Visit                     Keithan Dileonardo R. PTA Acute Rehabilitation Services Office: [phone]   Catalina Antigua 10/22/2022, 12:59 PM

## 2022-10-22 NOTE — Progress Notes (Signed)
Patient ID: Joan Robinson, female   DOB: 19-May-1960, 62 y.o.   MRN: [id number] Joan Robinson Robinson Progress Note  6 Days Post-Op  Subjective: CC-  Tolerating TF's at 61ml/hr without any abdominal pain, n/v. Multiple liquid bm's over the last 24 hours.   Afebrile. Tachycardic in low 100's today (104 on last check). No hypotension. Off [place], on RA. Started on Plavix yesterday. Labs pending.   Objective: Vital signs in last 24 hours: Temp:  [97.5 F (36.4 C)-99.3 F (37.4 C)] 97.7 F (36.5 C) (10/07 0818) Pulse Rate:  [92-107] 104 (10/07 0818) Resp:  [18-27] 18 (10/07 0316) BP: (137-164)/(91-109) 143/96 (10/07 0818) SpO2:  [89 %-100 %] 95 % (10/07 0818) Last BM Date : 10/21/22  Intake/Output from previous day: 10/06 0701 - 10/07 0700 In: 402.2 [NG/GT:402.2] Out: 440 [Urine:440] Intake/Output this shift: No intake/output data recorded.  PE: General: Alert, NAD Abd: Soft, ND, NT, +BS. Midline wound cdi with staples in place. Laparoscopic site cdi with staple in place  Lab Results:  Recent Labs    10/19/22 0836 10/20/22 0652  WBC 5.5 4.2  HGB 9.9* 9.9*  HCT 30.7* 30.6*  PLT 136* 129*   BMET Recent Labs    10/20/22 0652 10/20/22 1247 10/21/22 0147 10/21/22 0757  NA 150*   < > 146* 143  K 3.3*  --   --   --   CL 112*  --   --   --   CO2 25  --   --   --   GLUCOSE 130*  --   --   --   BUN 14  --   --   --   CREATININE 0.78  --   --   --   CALCIUM 8.0*  --   --   --    < > = values in this interval not displayed.   PT/INR No results for input(s): "LABPROT", "INR" in the last 72 hours. CMP     Component Value Date/Time   NA 143 10/21/2022 0757   K 3.3 (L) 10/20/2022 0652   CL 112 (H) 10/20/2022 0652   CO2 25 10/20/2022 0652   GLUCOSE 130 (H) 10/20/2022 0652   BUN 14 10/20/2022 0652   CREATININE 0.78 10/20/2022 0652   CREATININE 0.91 09/02/2015 1352   CALCIUM 8.0 (L) 10/20/2022 0652   PROT 6.0 (L) 10/18/2022 0957   ALBUMIN 2.8 (L) 10/18/2022 0957    AST 16 10/18/2022 0957   ALT 17 10/18/2022 0957   ALKPHOS 56 10/18/2022 0957   BILITOT 0.5 10/18/2022 0957   GFRNONAA >60 10/20/2022 0652   GFRAA  12/16/2009 2144    >60        The eGFR has been calculated using the MDRD equation. This calculation has not been validated in all clinical situations. eGFR's persistently <60 mL/min signify possible Chronic Kidney Disease.   Lipase     Component Value Date/Time   LIPASE 10 (L) 10/13/2022 2116       Studies/Results: DG CHEST PORT 1 VIEW  Result Date: 10/21/2022 CLINICAL DATA:  Shortness of breath EXAM: PORTABLE CHEST 1 VIEW COMPARISON:  10/16/2022 FINDINGS: The heart size and mediastinal contours are within normal limits. Unchanged small bilateral pleural effusions and associated atelectasis or consolidation. Non weighted enteric feeding tube with tip in the gastric fundus. The visualized skeletal structures are unremarkable. IMPRESSION: 1. Unchanged small bilateral pleural effusions and associated atelectasis or consolidation. 2. Non weighted enteric feeding tube with tip in the gastric  fundus. Electronically Signed   By: Joan Lesch M.D.   On: 10/21/2022 18:33    Anti-infectives: Anti-infectives (From admission, onward)    Start     Dose/Rate Route Frequency Ordered Stop   10/16/22 2200  cefoTEtan (CEFOTAN) 2 g in sodium chloride 0.9 % 100 mL IVPB        2 g 200 mL/hr over 30 Minutes Intravenous Every 12 hours 10/16/22 1524 10/17/22 1113   10/16/22 0930  cefoTEtan (CEFOTAN) 2 g in sodium chloride 0.9 % 100 mL IVPB        2 g 200 mL/hr over 30 Minutes Intravenous On call to O.R. 10/16/22 0844 10/17/22 0659      FINAL MICROSCOPIC DIAGNOSIS:   A. COLON, RIGHT, RESECTION:       Segment of colon with terminal ileum with submucosal congestion and  serosal mesothelial reaction.       Vermiform appendix without significant diagnostic alteration.       Four lymph nodes, negative for metastatic carcinoma (0/4).       Negative  for dysplasia or malignancy.   B. ADDITIONAL ILEUM, RESECTION:       Segment of small intestine without significant diagnostic  alteration.      Focal mesenteric hemorrhage.       Negative for dysplasia or malignancy.    Assessment/Plan Joan Robinson  POD#6 s/p Laparoscopic converted to open right hemicolectomy 10/1 Joan Robinson - Path benign as above.  - NPO per SLP. They are planning MBS this week. Tolerating TF's at goal and having bowel function.  - Mobilize, therapies - recommending CIR   Acute stroke - Per Neuro. Started on Plavix yesterday. Hgb pending.   FEN: NPO per SLP, TF's at goal.  VTE: sq heparin. Okay for Plavix ID: Cefotetan 10/1>>10/2. None currently. Aferbile. WBC wnl on last check.  Foley: Failed TOV, replaced 10/4. Good UOP.     LOS: 8 days    Joan Robinson, Joan Robinson 10/22/2022, 8:28 AM Please see Amion for pager number during day hours 7:00am-4:30pm

## 2022-10-22 NOTE — Progress Notes (Signed)
Inpatient Rehab Admissions Coordinator:   I received insurance approval for CIR admit, but I do not have a bed available for this patient to admit today.  Hopeful for admit pending bed availability in the next 1-2 days.    Estill Dooms, PT, DPT Admissions Coordinator [phone] 10/22/22  10:33 AM

## 2022-10-22 NOTE — Progress Notes (Signed)
Nutrition Follow-up  DOCUMENTATION CODES:   Severe malnutrition in context of acute illness/injury  INTERVENTION:  Continue tube feeding via Cortrak: Osmolite 1.5 at 50 ml/h (1200 ml per day) Prosource TF20 60 ml 1 x day  Provides 1880 kcal, 95 gm protein, 914 ml free water daily  Should pt continue with EN support banatrol 60ml BID  NUTRITION DIAGNOSIS:   Severe Malnutrition related to acute illness (SBO, stroke) as evidenced by energy intake < or equal to 50% for > or equal to 5 days, mild fat depletion, moderate muscle depletion.(On going)    GOAL:   Patient will meet greater than or equal to 90% of their needs    MONITOR:   Diet advancement, Labs, Weight trends, TF tolerance, Skin, I & O's  REASON FOR ASSESSMENT:   Consult Enteral/tube feeding initiation and management (OK to advance?)  ASSESSMENT:   62 year old female who presented to the ED on 9/28 with abdominal pain, N/V. PMH of complex hiatal hernia s/p multiple surgeries, postoperative esophageal stricture requiring dilation, GERD/PUD, gallstones, colon polyps, HTN, HLD, hypothyroidism, autoimmune urticaria, emphysema, Frieberg's disease, sleep apnea. Pt admitted with SOB.9/29 facial droop, head CT: left posteroinferior cerebellar artery territory infarction. 9/30 MRI: Large acute infarct affecting both the left PICA and left AICA. 10/1 Rt hemicolectomy  LPN reports good tolerance feeding at goal rate. Pt complaining of loose stools. Reached out to SLP today she has plans to see her today. PT and OT with pt at time of visit.  They just put her back in bed and she was almost sleeping at time of visit.  Daughter at bed side. Discussed loose stools can be expected with current nutrition support. Pend SLP evaluation today. Will order banatrol to assist with loose stools should she continue on EN support.  10/04- Cortrak; Left nare 10/04- endotracheal tube remove  Admit weight: 73.1 Kg Current weight: 73.5 kg  Weight  history: 10/20/22 73.9 kg  09/21/22 73.8 kg  09/04/22 73.7 kg  06/04/22 72.1 kg  08/11/21 76.7 kg    Average Meal Intake: NPO   Nutritionally Relevant Medications: Scheduled Meds:  levothyroxine  88 mcg Per Tube Q0600   pantoprazole (PROTONIX) IV  40 mg Intravenous Q24H    Continuous Infusions:  feeding supplement (OSMOLITE 1.5 CAL) 1,000 mL (10/22/22 0130)   PRN Meds:.acetaminophen **OR** acetaminophen, albuterol, dextrose, Gerhardt's butt cream, HYDROmorphone (DILAUDID) injection, lip balm, ondansetron **OR** ondansetron (ZOFRAN) IV, mouth rinse, prochlorperazine, sodium chloride  Labs Reviewed    NUTRITION - FOCUSED PHYSICAL EXAM:  Flowsheet Row Most Recent Value  Orbital Region Mild depletion  Upper Arm Region Mild depletion  Thoracic and Lumbar Region No depletion  Buccal Region Mild depletion  Temple Region Mild depletion  Clavicle Bone Region Mild depletion  Clavicle and Acromion Bone Region Moderate depletion  Scapular Bone Region Mild depletion  Dorsal Hand Mild depletion  Patellar Region Mild depletion  Anterior Thigh Region Moderate depletion  Posterior Calf Region Mild depletion  Edema (RD Assessment) Mild  Hair Reviewed  Eyes Reviewed  Mouth Reviewed  Skin Reviewed  Nails Reviewed       Diet Order:   Diet Order             Diet NPO time specified  Diet effective now                   EDUCATION NEEDS:   No education needs have been identified at this time  Skin:  Skin Assessment: Skin Integrity Issues: Skin Integrity  Issues:: Incisions Incisions: Abdomin  Last BM:  10/06  Height:   Ht Readings from Last 1 Encounters:  10/15/22 5\' 6"  (1.676 m)    Weight:   Wt Readings from Last 1 Encounters:  10/20/22 73.9 kg    Ideal Body Weight:  59.1 kg  BMI:  Body mass index is 26.3 kg/m.  Estimated Nutritional Needs:   Kcal:  1800-2000  Protein:  90-110  Fluid:  58ml/kcal    Jamelle Haring RDN, LDN Clinical Dietitian   RDN pager # available on Amion

## 2022-10-22 NOTE — Progress Notes (Signed)
PROGRESS NOTE    Joan Robinson  MVH:[id number] DOB: 03-12-1960 DOA: 10/13/2022 PCP: April Manson, NP  Chief Complaint  Patient presents with   Abdominal Pain    Brief Narrative:   62 y.o. female with past medical history of esophageal strictures status post dilation, multiple hernias status post multiple surgeries, GERD/PUD, hypertension, hyperlipidemia, autoimmune urticaria, emphysema, sleep apnea who presented to the ED with right upper quadrant and right lower quadrant abdominal pain x 2 days associated nausea. She was found to have small bowel obstruction and was being managed inpatient at [facility]. Then, noted to have left facial droop, slurring of her speech, and CT head without contrast which was notable for Joan Robinson moderately large left PICA territory infarct. She was transferred to ICU at [facility] and managed for stroke.  She's now s/p open right hemicolectomy on 10/1.  Transferred to [facility] on 10/7.  See previous notes for additional details.   Assessment & Plan:   Principal Problem:   SBO (small bowel obstruction) (HCC) Active Problems:   Hypothyroidism   Hyperlipidemia   Essential hypertension   GERD (gastroesophageal reflux disease)   History of esophageal stricture   Chronic anemia   Sleep apnea   Diverticulosis   History of emphysema (HCC)   Protein-calorie malnutrition, severe  Acute Stroke Cerebellar Edema  MRI 9/30 with large acute infarct affecting both L PICA and L AICA vascular territories.  Posterio fossa mass effect with effacement of the 4th ventricle inferiorly.  No evidence of obstructive hydrocephalus.  Mild inferior displacement of the ceebellar tonsils.  No more than mild mass effect upon the medulla. Serial head CT 10/2 showed unchanged appearance of L cerebellar infarct  10/4 showed stable size of L anterior inferior cerebellar hypodense nonhemorrhagic infarct - local mass efffect with effacement of the sulci - some mass effect on the 4th ventricle  without obstruction Echo with EF 60-65%, no RWMA ]LDL 54, A1c 5.6 Appreciate neurology assistance - etiology unclear, recommending plavix (aspirin allergy), crestor S/p 3% saline  SBO Cecal Volvulus s/p Laparoscopic converted to open right hemicolectomy 10/1  Appreciate surgery assistance Tolerating tube feeds  Neurogenic Dysphagia SLP recommending dysphagia 1, moderately thick liquids (honey thick)  Incomplete Left Eye Closure Continue eyedrops/ointment Nighttime patch, asked nurse to obtain   GERD PPI  Glaucoma Eye drops  Dyslipidemia Crestor   Hypothyroidism Synthroid   Insomnia Melatonin/norco at night  Holding home vyvanse    DVT prophylaxis: heparin Code Status: full Family Communication: daughter Disposition:   Status is: Inpatient Remains inpatient appropriate because: need for continued inpatient care   Consultants:  Surgery neurology  Procedures:   Laparoscopic converted to open right hemicolectomy  10/1  Echo   IMPRESSIONS     1. Left ventricular ejection fraction, by estimation, is 60 to 65%. The  left ventricle has normal function. The left ventricle has no regional  wall motion abnormalities. Left ventricular diastolic parameters were  normal.   2. Right ventricular systolic function is normal. The right ventricular  size is normal.   3. The mitral valve is normal in structure. Trivial mitral valve  regurgitation. No evidence of mitral stenosis.   4. The aortic valve is normal in structure. Aortic valve regurgitation is  not visualized. No aortic stenosis is present.  Antimicrobials:  Anti-infectives (From admission, onward)    Start     Dose/Rate Route Frequency Ordered Stop   10/16/22 2200  cefoTEtan (CEFOTAN) 2 g in sodium chloride 0.9 % 100 mL  PROGRESS NOTE    Joan Robinson  MVH:[id number] DOB: 03-12-1960 DOA: 10/13/2022 PCP: April Manson, NP  Chief Complaint  Patient presents with   Abdominal Pain    Brief Narrative:   62 y.o. female with past medical history of esophageal strictures status post dilation, multiple hernias status post multiple surgeries, GERD/PUD, hypertension, hyperlipidemia, autoimmune urticaria, emphysema, sleep apnea who presented to the ED with right upper quadrant and right lower quadrant abdominal pain x 2 days associated nausea. She was found to have small bowel obstruction and was being managed inpatient at [facility]. Then, noted to have left facial droop, slurring of her speech, and CT head without contrast which was notable for Joan Robinson moderately large left PICA territory infarct. She was transferred to ICU at [facility] and managed for stroke.  She's now s/p open right hemicolectomy on 10/1.  Transferred to [facility] on 10/7.  See previous notes for additional details.   Assessment & Plan:   Principal Problem:   SBO (small bowel obstruction) (HCC) Active Problems:   Hypothyroidism   Hyperlipidemia   Essential hypertension   GERD (gastroesophageal reflux disease)   History of esophageal stricture   Chronic anemia   Sleep apnea   Diverticulosis   History of emphysema (HCC)   Protein-calorie malnutrition, severe  Acute Stroke Cerebellar Edema  MRI 9/30 with large acute infarct affecting both L PICA and L AICA vascular territories.  Posterio fossa mass effect with effacement of the 4th ventricle inferiorly.  No evidence of obstructive hydrocephalus.  Mild inferior displacement of the ceebellar tonsils.  No more than mild mass effect upon the medulla. Serial head CT 10/2 showed unchanged appearance of L cerebellar infarct  10/4 showed stable size of L anterior inferior cerebellar hypodense nonhemorrhagic infarct - local mass efffect with effacement of the sulci - some mass effect on the 4th ventricle  without obstruction Echo with EF 60-65%, no RWMA ]LDL 54, A1c 5.6 Appreciate neurology assistance - etiology unclear, recommending plavix (aspirin allergy), crestor S/p 3% saline  SBO Cecal Volvulus s/p Laparoscopic converted to open right hemicolectomy 10/1  Appreciate surgery assistance Tolerating tube feeds  Neurogenic Dysphagia SLP recommending dysphagia 1, moderately thick liquids (honey thick)  Incomplete Left Eye Closure Continue eyedrops/ointment Nighttime patch, asked nurse to obtain   GERD PPI  Glaucoma Eye drops  Dyslipidemia Crestor   Hypothyroidism Synthroid   Insomnia Melatonin/norco at night  Holding home vyvanse    DVT prophylaxis: heparin Code Status: full Family Communication: daughter Disposition:   Status is: Inpatient Remains inpatient appropriate because: need for continued inpatient care   Consultants:  Surgery neurology  Procedures:   Laparoscopic converted to open right hemicolectomy  10/1  Echo   IMPRESSIONS     1. Left ventricular ejection fraction, by estimation, is 60 to 65%. The  left ventricle has normal function. The left ventricle has no regional  wall motion abnormalities. Left ventricular diastolic parameters were  normal.   2. Right ventricular systolic function is normal. The right ventricular  size is normal.   3. The mitral valve is normal in structure. Trivial mitral valve  regurgitation. No evidence of mitral stenosis.   4. The aortic valve is normal in structure. Aortic valve regurgitation is  not visualized. No aortic stenosis is present.  Antimicrobials:  Anti-infectives (From admission, onward)    Start     Dose/Rate Route Frequency Ordered Stop   10/16/22 2200  cefoTEtan (CEFOTAN) 2 g in sodium chloride 0.9 % 100 mL  3.2   < > = values in this interval not displayed.    GFR: Estimated Creatinine Clearance: 75.9 mL/min (by C-G formula based on SCr of 0.7 mg/dL).  Liver Function Tests: Recent Labs  Lab 10/16/22 0744 10/17/22 0629 10/18/22 0957 10/22/22 0914  AST 18 18 16 26   ALT 15 16 17 26   ALKPHOS 63 60 56 85  BILITOT 0.8 0.8 0.5 0.6  PROT 5.9* 6.2* 6.0* 6.7  ALBUMIN 3.3* 3.0* 2.8* 3.0*    CBG: Recent Labs  Lab 10/22/22 0014 10/22/22 0319 10/22/22 0827 10/22/22 1222 10/22/22 1703  GLUCAP 131* 111* 133* 114* 170*     Recent Results (from the past 240 hour(s))  MRSA Next Gen by PCR, Nasal     Status: None   Collection Time: 10/14/22 10:29 PM   Specimen: Nasal Mucosa; Nasal Swab  Result Value Ref Range Status   MRSA by PCR Next Gen NOT DETECTED NOT DETECTED Final    Comment: (NOTE) The GeneXpert MRSA Assay (FDA approved for NASAL specimens only), is one component of Silena Wyss comprehensive MRSA colonization surveillance program. It is not intended to diagnose MRSA infection nor to guide or monitor treatment for MRSA infections. Test performance is not FDA approved in patients less than 62 years old. Performed at [facility], [address], [place], [place] [address]   MRSA Next Gen by PCR, Nasal     Status: None   Collection Time: 10/15/22 12:41 AM   Specimen: Nasal Mucosa; Nasal Swab  Result Value Ref Range Status   MRSA by PCR Next Gen NOT DETECTED NOT DETECTED Final    Comment: (NOTE) The GeneXpert MRSA Assay (FDA approved for NASAL specimens only), is one component of Caylan Schifano comprehensive MRSA colonization surveillance program. It is not intended to diagnose MRSA infection nor to guide or monitor  treatment for MRSA infections. Test performance is not FDA approved in patients less than 33 years old. Performed at [facility], [address], [place], [place] [address]          Radiology Studies: DG Swallowing Func-Speech Pathology  Result Date: 10/22/2022 Table formatting from the original result was not included. Modified Barium Swallow Study Patient Details Name: Joan Robinson MRN: [id number] Date of Birth: April 23, 1960 Today's Date: 10/22/2022 HPI/PMH: HPI: 62 yo admitted 9/28 with RUQ pain with SBO.  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 PMhx: HTN, HLD, peptic ulcer disease, diverticulitis, bipolar disorder, hypothyroidism, GERD, Hashimoto's disease. Hemicolectomy completed on 10/1. Clinical Impression: Clinical Impression: Pt presents with Vyla Pint neurogenic dysphagia. Oral phase is c/b poor bolus awareness and control with anterior loss from right side as well as posterior spillage into pharynx.  When pt was cued to actively accept spoon into mouth and seal her lips, she demonstrated improved control.  There was intermittent spilling of trace amounts of thin and nectar thick liquid into the airway - either before the onset of the pharyngeal or from residue after - but aspiration did not elicit Ranferi Clingan cough response.  There was adequate laryngeal mobility.  There was minimal residue overall.  Given concern at this time for silent aspiration, recommend starting conservative diet of dysphagia 1/honey thick liquids.  Eating will require great effort and concentration for Ms. Montanari  at this time, so I would not anticipate ample PO intake. Recommend continuing TF. SLP will follow for dysphagia tx. Factors that may increase risk of adverse event in presence of  PROGRESS NOTE    Joan Robinson  MVH:[id number] DOB: 03-12-1960 DOA: 10/13/2022 PCP: April Manson, NP  Chief Complaint  Patient presents with   Abdominal Pain    Brief Narrative:   62 y.o. female with past medical history of esophageal strictures status post dilation, multiple hernias status post multiple surgeries, GERD/PUD, hypertension, hyperlipidemia, autoimmune urticaria, emphysema, sleep apnea who presented to the ED with right upper quadrant and right lower quadrant abdominal pain x 2 days associated nausea. She was found to have small bowel obstruction and was being managed inpatient at [facility]. Then, noted to have left facial droop, slurring of her speech, and CT head without contrast which was notable for Joan Robinson moderately large left PICA territory infarct. She was transferred to ICU at [facility] and managed for stroke.  She's now s/p open right hemicolectomy on 10/1.  Transferred to [facility] on 10/7.  See previous notes for additional details.   Assessment & Plan:   Principal Problem:   SBO (small bowel obstruction) (HCC) Active Problems:   Hypothyroidism   Hyperlipidemia   Essential hypertension   GERD (gastroesophageal reflux disease)   History of esophageal stricture   Chronic anemia   Sleep apnea   Diverticulosis   History of emphysema (HCC)   Protein-calorie malnutrition, severe  Acute Stroke Cerebellar Edema  MRI 9/30 with large acute infarct affecting both L PICA and L AICA vascular territories.  Posterio fossa mass effect with effacement of the 4th ventricle inferiorly.  No evidence of obstructive hydrocephalus.  Mild inferior displacement of the ceebellar tonsils.  No more than mild mass effect upon the medulla. Serial head CT 10/2 showed unchanged appearance of L cerebellar infarct  10/4 showed stable size of L anterior inferior cerebellar hypodense nonhemorrhagic infarct - local mass efffect with effacement of the sulci - some mass effect on the 4th ventricle  without obstruction Echo with EF 60-65%, no RWMA ]LDL 54, A1c 5.6 Appreciate neurology assistance - etiology unclear, recommending plavix (aspirin allergy), crestor S/p 3% saline  SBO Cecal Volvulus s/p Laparoscopic converted to open right hemicolectomy 10/1  Appreciate surgery assistance Tolerating tube feeds  Neurogenic Dysphagia SLP recommending dysphagia 1, moderately thick liquids (honey thick)  Incomplete Left Eye Closure Continue eyedrops/ointment Nighttime patch, asked nurse to obtain   GERD PPI  Glaucoma Eye drops  Dyslipidemia Crestor   Hypothyroidism Synthroid   Insomnia Melatonin/norco at night  Holding home vyvanse    DVT prophylaxis: heparin Code Status: full Family Communication: daughter Disposition:   Status is: Inpatient Remains inpatient appropriate because: need for continued inpatient care   Consultants:  Surgery neurology  Procedures:   Laparoscopic converted to open right hemicolectomy  10/1  Echo   IMPRESSIONS     1. Left ventricular ejection fraction, by estimation, is 60 to 65%. The  left ventricle has normal function. The left ventricle has no regional  wall motion abnormalities. Left ventricular diastolic parameters were  normal.   2. Right ventricular systolic function is normal. The right ventricular  size is normal.   3. The mitral valve is normal in structure. Trivial mitral valve  regurgitation. No evidence of mitral stenosis.   4. The aortic valve is normal in structure. Aortic valve regurgitation is  not visualized. No aortic stenosis is present.  Antimicrobials:  Anti-infectives (From admission, onward)    Start     Dose/Rate Route Frequency Ordered Stop   10/16/22 2200  cefoTEtan (CEFOTAN) 2 g in sodium chloride 0.9 % 100 mL  PROGRESS NOTE    Joan Robinson  MVH:[id number] DOB: 03-12-1960 DOA: 10/13/2022 PCP: April Manson, NP  Chief Complaint  Patient presents with   Abdominal Pain    Brief Narrative:   62 y.o. female with past medical history of esophageal strictures status post dilation, multiple hernias status post multiple surgeries, GERD/PUD, hypertension, hyperlipidemia, autoimmune urticaria, emphysema, sleep apnea who presented to the ED with right upper quadrant and right lower quadrant abdominal pain x 2 days associated nausea. She was found to have small bowel obstruction and was being managed inpatient at [facility]. Then, noted to have left facial droop, slurring of her speech, and CT head without contrast which was notable for Joan Robinson moderately large left PICA territory infarct. She was transferred to ICU at [facility] and managed for stroke.  She's now s/p open right hemicolectomy on 10/1.  Transferred to [facility] on 10/7.  See previous notes for additional details.   Assessment & Plan:   Principal Problem:   SBO (small bowel obstruction) (HCC) Active Problems:   Hypothyroidism   Hyperlipidemia   Essential hypertension   GERD (gastroesophageal reflux disease)   History of esophageal stricture   Chronic anemia   Sleep apnea   Diverticulosis   History of emphysema (HCC)   Protein-calorie malnutrition, severe  Acute Stroke Cerebellar Edema  MRI 9/30 with large acute infarct affecting both L PICA and L AICA vascular territories.  Posterio fossa mass effect with effacement of the 4th ventricle inferiorly.  No evidence of obstructive hydrocephalus.  Mild inferior displacement of the ceebellar tonsils.  No more than mild mass effect upon the medulla. Serial head CT 10/2 showed unchanged appearance of L cerebellar infarct  10/4 showed stable size of L anterior inferior cerebellar hypodense nonhemorrhagic infarct - local mass efffect with effacement of the sulci - some mass effect on the 4th ventricle  without obstruction Echo with EF 60-65%, no RWMA ]LDL 54, A1c 5.6 Appreciate neurology assistance - etiology unclear, recommending plavix (aspirin allergy), crestor S/p 3% saline  SBO Cecal Volvulus s/p Laparoscopic converted to open right hemicolectomy 10/1  Appreciate surgery assistance Tolerating tube feeds  Neurogenic Dysphagia SLP recommending dysphagia 1, moderately thick liquids (honey thick)  Incomplete Left Eye Closure Continue eyedrops/ointment Nighttime patch, asked nurse to obtain   GERD PPI  Glaucoma Eye drops  Dyslipidemia Crestor   Hypothyroidism Synthroid   Insomnia Melatonin/norco at night  Holding home vyvanse    DVT prophylaxis: heparin Code Status: full Family Communication: daughter Disposition:   Status is: Inpatient Remains inpatient appropriate because: need for continued inpatient care   Consultants:  Surgery neurology  Procedures:   Laparoscopic converted to open right hemicolectomy  10/1  Echo   IMPRESSIONS     1. Left ventricular ejection fraction, by estimation, is 60 to 65%. The  left ventricle has normal function. The left ventricle has no regional  wall motion abnormalities. Left ventricular diastolic parameters were  normal.   2. Right ventricular systolic function is normal. The right ventricular  size is normal.   3. The mitral valve is normal in structure. Trivial mitral valve  regurgitation. No evidence of mitral stenosis.   4. The aortic valve is normal in structure. Aortic valve regurgitation is  not visualized. No aortic stenosis is present.  Antimicrobials:  Anti-infectives (From admission, onward)    Start     Dose/Rate Route Frequency Ordered Stop   10/16/22 2200  cefoTEtan (CEFOTAN) 2 g in sodium chloride 0.9 % 100 mL  3.2   < > = values in this interval not displayed.    GFR: Estimated Creatinine Clearance: 75.9 mL/min (by C-G formula based on SCr of 0.7 mg/dL).  Liver Function Tests: Recent Labs  Lab 10/16/22 0744 10/17/22 0629 10/18/22 0957 10/22/22 0914  AST 18 18 16 26   ALT 15 16 17 26   ALKPHOS 63 60 56 85  BILITOT 0.8 0.8 0.5 0.6  PROT 5.9* 6.2* 6.0* 6.7  ALBUMIN 3.3* 3.0* 2.8* 3.0*    CBG: Recent Labs  Lab 10/22/22 0014 10/22/22 0319 10/22/22 0827 10/22/22 1222 10/22/22 1703  GLUCAP 131* 111* 133* 114* 170*     Recent Results (from the past 240 hour(s))  MRSA Next Gen by PCR, Nasal     Status: None   Collection Time: 10/14/22 10:29 PM   Specimen: Nasal Mucosa; Nasal Swab  Result Value Ref Range Status   MRSA by PCR Next Gen NOT DETECTED NOT DETECTED Final    Comment: (NOTE) The GeneXpert MRSA Assay (FDA approved for NASAL specimens only), is one component of Silena Wyss comprehensive MRSA colonization surveillance program. It is not intended to diagnose MRSA infection nor to guide or monitor treatment for MRSA infections. Test performance is not FDA approved in patients less than 62 years old. Performed at [facility], [address], [place], [place] [address]   MRSA Next Gen by PCR, Nasal     Status: None   Collection Time: 10/15/22 12:41 AM   Specimen: Nasal Mucosa; Nasal Swab  Result Value Ref Range Status   MRSA by PCR Next Gen NOT DETECTED NOT DETECTED Final    Comment: (NOTE) The GeneXpert MRSA Assay (FDA approved for NASAL specimens only), is one component of Caylan Schifano comprehensive MRSA colonization surveillance program. It is not intended to diagnose MRSA infection nor to guide or monitor  treatment for MRSA infections. Test performance is not FDA approved in patients less than 33 years old. Performed at [facility], [address], [place], [place] [address]          Radiology Studies: DG Swallowing Func-Speech Pathology  Result Date: 10/22/2022 Table formatting from the original result was not included. Modified Barium Swallow Study Patient Details Name: Joan Robinson MRN: [id number] Date of Birth: April 23, 1960 Today's Date: 10/22/2022 HPI/PMH: HPI: 62 yo admitted 9/28 with RUQ pain with SBO.  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 PMhx: HTN, HLD, peptic ulcer disease, diverticulitis, bipolar disorder, hypothyroidism, GERD, Hashimoto's disease. Hemicolectomy completed on 10/1. Clinical Impression: Clinical Impression: Pt presents with Vyla Pint neurogenic dysphagia. Oral phase is c/b poor bolus awareness and control with anterior loss from right side as well as posterior spillage into pharynx.  When pt was cued to actively accept spoon into mouth and seal her lips, she demonstrated improved control.  There was intermittent spilling of trace amounts of thin and nectar thick liquid into the airway - either before the onset of the pharyngeal or from residue after - but aspiration did not elicit Ranferi Clingan cough response.  There was adequate laryngeal mobility.  There was minimal residue overall.  Given concern at this time for silent aspiration, recommend starting conservative diet of dysphagia 1/honey thick liquids.  Eating will require great effort and concentration for Ms. Montanari  at this time, so I would not anticipate ample PO intake. Recommend continuing TF. SLP will follow for dysphagia tx. Factors that may increase risk of adverse event in presence of

## 2022-10-22 NOTE — Progress Notes (Signed)
STROKE TEAM PROGRESS NOTE   INTERVAL HISTORY  Husband at bedside. Appears to be doing well.  She has some blurred vision on the left eye and is unable to keep a close. Ready to peds  Recommend eye patch to wear at night and eyedrops  Vitals:   10/22/22 0316 10/22/22 0818 10/22/22 1225 10/22/22 1701  BP: (!) 150/102 (!) 143/96 (!) 141/95 (!) 134/94  Pulse: 93 (!) 104 (!) 102 (!) 111  Resp: 18     Temp: 98 F (36.7 C) 97.7 F (36.5 C)  98.2 F (36.8 C)  TempSrc:  Oral  Oral  SpO2: 95% 95% 92% 94%  Weight:      Height:       CBC:  Recent Labs  Lab 10/20/22 0652 10/22/22 0914  WBC 4.2 5.7  NEUTROABS  --  3.7  HGB 9.9* 12.9  HCT 30.6* 38.4  MCV 92.7 88.5  PLT 129* 193   Basic Metabolic Panel:  Recent Labs  Lab 10/20/22 0652 10/20/22 1247 10/21/22 0757 10/22/22 0914  NA 150*   < > 143 140  K 3.3*  --   --  3.5  CL 112*  --   --  102  CO2 25  --   --  24  GLUCOSE 130*  --   --  116*  BUN 14  --   --  12  CREATININE 0.78  --   --  0.70  CALCIUM 8.0*  --   --  8.7*  MG  --   --   --  1.9  PHOS  --   --   --  3.2   < > = values in this interval not displayed.   Lipid Panel:  No results for input(s): "CHOL", "TRIG", "HDL", "CHOLHDL", "VLDL", "LDLCALC" in the last 168 hours.  HgbA1c:  No results for input(s): "HGBA1C" in the last 168 hours.   IMAGING past 24 hours DG Swallowing Func-Speech Pathology  Result Date: 10/22/2022 Table formatting from the original result was not included. Modified Barium Swallow Study Patient Details Name: Joan Robinson MRN: [id number] Date of Birth: [birth year] Today's Date: 10/22/2022 HPI/PMH: HPI: 62 yo admitted 9/28 with RUQ pain with SBO.  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 PMhx: HTN, HLD, peptic ulcer disease, diverticulitis, bipolar disorder, hypothyroidism, GERD, Hashimoto's disease. Hemicolectomy completed on 10/1. Clinical Impression: Clinical  Impression: Pt presents with a neurogenic dysphagia. Oral phase is c/b poor bolus awareness and control with anterior loss from right side as well as posterior spillage into pharynx.  When pt was cued to actively accept spoon into mouth and seal her lips, she demonstrated improved control.  There was intermittent spilling of trace amounts of thin and nectar thick liquid into the airway - either before the onset of the pharyngeal or from residue after - but aspiration did not elicit a cough response.  There was adequate laryngeal mobility.  There was minimal residue overall.  Given concern at this time for silent aspiration, recommend starting conservative diet of dysphagia 1/honey thick liquids.  Eating will require great effort and concentration for Joan Robinson 62 at this time, so I would not anticipate ample PO intake. Recommend continuing TF. SLP will follow for dysphagia tx. Factors that may increase risk of adverse event in presence of aspiration Joan Robinson): Factors that may increase risk of adverse event in presence of aspiration Joan Robinson): Weak cough Recommendations/Plan:  Swallowing Evaluation Recommendations Swallowing Evaluation Recommendations Recommendations: PO diet PO Diet Recommendation: Dysphagia 1 (Pureed); Moderately thick liquids (Level 3, honey thick) Liquid Administration via: Cup; Spoon Medication Administration: Crushed with puree Supervision: Staff to assist with self-feeding Swallowing strategies  : Slow rate; Check for pocketing or oral holding; Check for anterior loss Oral care recommendations: Oral care BID (2x/day) Treatment Plan Treatment Plan Treatment recommendations: Therapy as outlined in treatment plan below Functional status assessment: Patient has had a recent decline in their functional status and demonstrates the ability to make significant improvements in function in a reasonable and predictable amount of time. Treatment frequency: Min 2x/week Treatment  duration: 2 weeks Interventions: Patient/family education; Trials of upgraded texture/liquids; Diet toleration management by SLP; Oropharyngeal exercises Recommendations Recommendations for follow up therapy are one component of a multi-disciplinary discharge planning process, led by the attending physician.  Recommendations may be updated based on patient status, additional functional criteria and insurance authorization. Assessment: Orofacial Exam: Orofacial Exam Oral Cavity: Oral Hygiene: WFL Oral Cavity - Dentition: Poor condition; Missing dentition Anatomy: Anatomy: WFL Boluses Administered: Boluses Administered Boluses Administered: Thin liquids (Level 0); Mildly thick liquids (Level 2, nectar thick); Moderately thick liquids (Level 3, honey thick); Puree  Oral Impairment Domain: Oral Impairment Domain Lip Closure: Escape beyond mid-chin Tongue control during bolus hold: Posterior escape of greater than half of bolus Bolus preparation/mastication: Slow prolonged chewing/mashing with complete recollection Initiation of pharyngeal swallow : Pyriform sinuses  Pharyngeal Impairment Domain: Pharyngeal Impairment Domain Laryngeal elevation: Partial superior movement of thyroid cartilage/partial approximation of arytenoids to epiglottic petiole Anterior hyoid excursion: Partial anterior movement Epiglottic movement: Complete inversion Laryngeal vestibule closure: Incomplete, narrow column air/contrast in laryngeal vestibule Pharyngeal stripping wave : Present - diminished Pharyngeal contraction (A/P view only): N/A Pharyngoesophageal segment opening: Partial distention/partial duration, partial obstruction of flow Tongue base retraction: Trace column of contrast or air between tongue base and PPW Pharyngeal residue: Trace residue within or on pharyngeal structures Location of pharyngeal residue: Valleculae  Esophageal Impairment Domain: No data recorded Pill: Pill Consistency administered: -- (not tested)  Penetration/Aspiration Scale Score: Penetration/Aspiration Scale Score 1.  Material does not enter airway: Puree; Moderately thick liquids (Level 3, honey thick) 8.  Material enters airway, passes BELOW cords without attempt by patient to eject out (silent aspiration) : Thin liquids (Level 0); Mildly thick liquids (Level 2, nectar thick) Compensatory Strategies: Compensatory Strategies Compensatory strategies: Yes Straw: Ineffective Ineffective Straw: Thin liquid (Level 0); Mildly thick liquid (Level 2, nectar thick) Chin tuck: Ineffective Ineffective Chin Tuck: Mildly thick liquid (Level 2, nectar thick) Left head turn: Ineffective   General Information: Caregiver present: No  Diet Prior to this Study: NPO; Cortrak/Small bore NG tube   Temperature : Normal   Respiratory Status: WFL   Supplemental O2: Nasal cannula   History of Recent Intubation: Yes  Behavior/Cognition: Alert; Cooperative; Pleasant mood Self-Feeding Abilities: Needs assist with self-feeding Baseline vocal quality/speech: Normal Volitional Cough: Able to elicit No data recorded Exam Limitations: No limitations Goal Planning: Prognosis for improved oropharyngeal function: Good No data recorded No data recorded No data recorded No data recorded Pain: Pain Assessment Pain Assessment: No/denies pain Faces Pain Scale: 4 Pain Location: bottom with peri-care Pain Descriptors / Indicators: Sore; Discomfort; Tender Pain Intervention(s): Monitored during session; Limited activity within patient's tolerance; Repositioned End of Session: Start Time:SLP Start Time (ACUTE ONLY): 1247 Stop Time: SLP Stop Time (ACUTE ONLY): 1306 Time Calculation:SLP Time Calculation (min) (ACUTE ONLY): 19 min Charges: SLP Evaluations $ SLP  Speech Visit: 1 Visit SLP Evaluations $MBS Swallow: 1 Procedure $Swallowing Treatment: 1 Procedure SLP visit diagnosis: SLP Visit Diagnosis: Dysphagia, oropharyngeal phase (R13.12) Past Medical History: Past Medical History: Diagnosis Date   Allergic rhinitis   Anemia 10/10/2011  Anxiety and depression 01/17/2007  Qualifier: Diagnosis of  By: Everardo All MD, Sean A   Arthritis 07/24/2013  Likely inflammatory and following with Dr Maryln Gottron of [facility]  rheumatology  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  BCC (basal cell carcinoma of skin) 06/01/2012  Leg Follows with Dr Margo Aye  Bipolar disorder (HCC) 01/17/2007  Qualifier: Diagnosis of  By: Everardo All MD, Sean A   Cataract   Diverticulosis   Diverticulosis   Emphysema of lung (HCC)   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  Gallstones   GERD (gastroesophageal reflux disease)   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  Hashimoto's disease   Hyperlipidemia   Hypertension   Hypothyroidism 08/24/2006  Qualifier: Diagnosis of  By: Everardo All MD, Sean A    IBS (irritable bowel syndrome) 07/27/2016  Obesity 11/01/2013  PUD (peptic ulcer disease)   Sleep apnea 04/27/2016  Tobacco abuse  Past Surgical History: Past Surgical History: Procedure Laterality Date  ABDOMINAL HYSTERECTOMY    ABDOMINAL HYSTERECTOMY  01/15/2009  complete  COLONOSCOPY    DILATION AND CURETTAGE OF UTERUS  01/16/1983  EYE SURGERY  03/03/2014  Surgery on both eyes for epiretinal membrane (vitreous peel)  Gated Spect wall motion stress cardiolite  11/05/2001  HIATAL HERNIA REPAIR N/A 07/12/2021  Procedure: LAPAROSCOPY W/ EXTENSIVE FOREGUT DISSECTION; PARTIAL STOMACH REDUCTION; GASTROSTOMY TUBE PLACEMENT; GASTROPEXY;  Surgeon: Luretha Murphy, MD;  Location: [facility];  Service: General;  Laterality: N/A;  LAPAROSCOPIC RIGHT HEMI COLECTOMY Right 10/16/2022  Procedure: LAPAROSCOPIC ASSISTED RIGHT HEMI COLECTOMY;  Surgeon: Andria Meuse, MD;  Location: [facility] OR;  Service: General;  Laterality: Right;  POLYPECTOMY    TUBAL LIGATION  01/15/1993  UTERINE SUSPENSION    mesh  VITRECTOMY Bilateral 03/03/2014  XI ROBOTIC ASSISTED HIATAL HERNIA REPAIR N/A 05/01/2021  Procedure: XI ROBOTIC ASSISTED TYPE III HIATAL HERNIA REPAIR WITH FUNDOPLICATION;  Surgeon: Luretha Murphy, MD;  Location:  [facility];  Service: General;  Laterality: N/A; Joan Robinson 10/22/2022, 3:35 PM   PHYSICAL EXAM Physical Exam  Constitutional: Appears well-developed and well-nourished. Laying in bed. Psych: Affect appropriate to situation HENT: Normocephalic.  Core track tube in place.  Musculoskeletal-no joint tenderness, deformity or swelling Cardiovascular: Normal rate and regular rhythm.  Respiratory: Effort normal, non-labored GI: Soft.  No distension. There is no tenderness.  Skin: WDI   Neuro:   MS: alert, awake. Oriented to person, place time.  Follows all commands, no aphasia. Dysarthria present.  CN: PERRL, EOMI. Left upper and lower facial droop severe.  She is unable to close her left eye appears red and irritated.  Tongue midline. No visual field cut.  Motor: able to lift all extemities but drift in left arm/leg.  Ataxia: present in left arm/leg.  Sensation: intact.     ASSESSMENT/PLAN Joan Robinson is a 62 y.o. female with past medical history of esophageal strictures status post dilation, multiple hernias status post multiple surgeries, GERD/PUD, hypertension, hyperlipidemia, autoimmune urticaria, emphysema, sleep apnea who presents to the ED with right upper quadrant and right lower quadrant abdominal pain x 2 days associated nausea.  She was found to have small bowel obstruction and was being managed inpatient at [facility].  Then, noted to have left facial droop, slurring of her speech, and CT head without contrast which was notable for a  moderately large left PICA territory infarct. CT angio head and neck with and without contrast was negative for any large vessel occlusion.  Specifically, no basilar occlusion or thrombosis noted.  CT perfusion with no core or mismatch noted.  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 Confluent cytotoxic  edema but no hemorrhagic transformation, no interval extension, and no significant posterior fossa mass effect. CTA head & neck left AICA not visualized Ct perfusion: no core MRI Large acute infarct (affecting both the left PICA and left AICA vascular territories). Posterior fossa mass effect with effacement of the fourth ventricle inferiorly. No evidence of obstructive hydrocephalus at this time.  CT repeat 10/2 showed unchanged appearance of left cerebellar infarct.  No acute hemorrhage. Repeat CT 10/4-stable 2D Echo EF 60-65% LDL 54 HgbA1c 5.6 VTE prophylaxis - heparin subcutaneous  No antithrombotic prior to admission, con' t Plavix. ASA allergy Therapy recommendations:  CIR Disposition:  pending  Cerebellar edema Mild mass effect to compress on the brainstem Risk of developing hydrocephalus On 3% saline @75 ->NS @75 ->3% saline @ 25 Need close clinical and imaging monitoring CT repeat 10/2 stable infarct, no hemorrhage, no hydrocephalus CT repeat stable. Sodium 150-153-154-156-153-152->148>150>143 Sodium check every 6 hours  SBO Pt admitted for abdominal pain x 2 days with N/V Found to have SBO, may related to her previous multiple surgeries for hernia Surgery on board Now NPO with NG suction KUB - Dilated loops of large bowel seen in the right upper quadrant and multiple dilated loops of small bowel, 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 CT abd/pelvis progression of SBO Status post surgery treatment 10/1   Hypertension Home meds:  none Stable Long-term BP goal normotensive  Hyperlipidemia Home meds:  crestor 20mg   LDL 54, goal < 70 Con't  home crestor 20  Continue statin at discharge  FEN:  TF @20 . Increase to 50 now with goal of 60. Move by 10 q 6hrs.  Seen by speech today MBS completed advance to dysphagia 1 pured diet continue to monitor  Other Stroke Risk Factors Sleep apnea, not on CPAP at home  Other  Active Problems Esophageal stricture s/p dilation  PUD multiple hernias status post multiple surgeries  GERD Autoimmune urticaria  Emphysema  Left eye irritation-recommend wearing eye patch at night and lubricating drops.  Discussed with Dr. Lowell Guitar.  Hospital day # 8  Overall doing well.  Eyepatch in the left eye at night and lubricating drops.  Diet advanced to dysphagia type I.  Continue to monitor.  Patient and her husband's questions answered to their satisfaction.  She would likely need FMLA for long-term disability from work  Pete Merten,MD   To contact Stroke Continuity provider, please refer to [email or web address]. After hours, contact General Neurology

## 2022-10-23 ENCOUNTER — Inpatient Hospital Stay (HOSPITAL_COMMUNITY): Payer: BC Managed Care – PPO

## 2022-10-23 DIAGNOSIS — K56609 Unspecified intestinal obstruction, unspecified as to partial versus complete obstruction: Secondary | ICD-10-CM | POA: Diagnosis not present

## 2022-10-23 LAB — CBC WITH DIFFERENTIAL/PLATELET
Abs Immature Granulocytes: 0.16 10*3/uL — ABNORMAL HIGH (ref 0.00–0.07)
Basophils Absolute: 0 10*3/uL (ref 0.0–0.1)
Basophils Relative: 1 %
Eosinophils Absolute: 0.3 10*3/uL (ref 0.0–0.5)
Eosinophils Relative: 4 %
HCT: 35.5 % — ABNORMAL LOW (ref 36.0–46.0)
Hemoglobin: 11.6 g/dL — ABNORMAL LOW (ref 12.0–15.0)
Immature Granulocytes: 2 %
Lymphocytes Relative: 18 %
Lymphs Abs: 1.4 10*3/uL (ref 0.7–4.0)
MCH: 28.6 pg (ref 26.0–34.0)
MCHC: 32.7 g/dL (ref 30.0–36.0)
MCV: 87.7 fL (ref 80.0–100.0)
Monocytes Absolute: 0.9 10*3/uL (ref 0.1–1.0)
Monocytes Relative: 12 %
Neutro Abs: 4.9 10*3/uL (ref 1.7–7.7)
Neutrophils Relative %: 63 %
Platelets: 207 10*3/uL (ref 150–400)
RBC: 4.05 MIL/uL (ref 3.87–5.11)
RDW: 12.6 % (ref 11.5–15.5)
WBC: 7.6 10*3/uL (ref 4.0–10.5)
nRBC: 0 % (ref 0.0–0.2)

## 2022-10-23 LAB — COMPREHENSIVE METABOLIC PANEL
ALT: 25 U/L (ref 0–44)
AST: 24 U/L (ref 15–41)
Albumin: 2.9 g/dL — ABNORMAL LOW (ref 3.5–5.0)
Alkaline Phosphatase: 84 U/L (ref 38–126)
Anion gap: 11 (ref 5–15)
BUN: 11 mg/dL (ref 8–23)
CO2: 25 mmol/L (ref 22–32)
Calcium: 8.6 mg/dL — ABNORMAL LOW (ref 8.9–10.3)
Chloride: 102 mmol/L (ref 98–111)
Creatinine, Ser: 0.61 mg/dL (ref 0.44–1.00)
GFR, Estimated: >60 mL/min (ref >60)
Glucose, Bld: 116 mg/dL — ABNORMAL HIGH (ref 70–99)
Potassium: 3.6 mmol/L (ref 3.5–5.1)
Sodium: 138 mmol/L (ref 135–145)
Total Bilirubin: 0.7 mg/dL (ref 0.3–1.2)
Total Protein: 6.2 g/dL — ABNORMAL LOW (ref 6.5–8.1)

## 2022-10-23 LAB — GLUCOSE, CAPILLARY
Glucose-Capillary: 123 mg/dL — ABNORMAL HIGH (ref 70–99)
Glucose-Capillary: 120 mg/dL — ABNORMAL HIGH (ref 70–99)
Glucose-Capillary: 120 mg/dL — ABNORMAL HIGH (ref 70–99)
Glucose-Capillary: 121 mg/dL — ABNORMAL HIGH (ref 70–99)

## 2022-10-23 LAB — PHOSPHORUS: Phosphorus: 3.9 mg/dL (ref 2.5–4.6)

## 2022-10-23 LAB — MAGNESIUM: Magnesium: 2 mg/dL (ref 1.7–2.4)

## 2022-10-23 MED ORDER — ACETAMINOPHEN 160 MG/5ML PO SOLN
500.0000 mg | Freq: Four times a day (QID) | ORAL | Status: DC
  Administered 2022-10-23 – 2022-10-25 (×8): 500 mg via ORAL
  Filled 2022-10-23 (×9): qty 20.3

## 2022-10-23 MED ORDER — POLYVINYL ALCOHOL 1.4 % OP SOLN
2.0000 [drp] | Freq: Four times a day (QID) | OPHTHALMIC | Status: DC
  Administered 2022-10-23 – 2022-10-25 (×7): 2 [drp] via OPHTHALMIC
  Filled 2022-10-23: qty 15

## 2022-10-23 MED ORDER — ONDANSETRON HCL 4 MG PO TABS: 4.0000 mg | ORAL_TABLET | Freq: Four times a day (QID) | ORAL | Status: DC | PRN

## 2022-10-23 MED ORDER — MELATONIN 5 MG PO TABS
10.0000 mg | ORAL_TABLET | Freq: Every day | ORAL | Status: DC
  Administered 2022-10-25: 10 mg via ORAL
  Filled 2022-10-23: qty 2

## 2022-10-23 MED ORDER — HYDROCODONE-ACETAMINOPHEN 5-325 MG PO TABS
1.0000 | ORAL_TABLET | Freq: Every day | ORAL | Status: DC
  Administered 2022-10-23 – 2022-10-24 (×2): 1 via ORAL
  Filled 2022-10-23 (×2): qty 1

## 2022-10-23 MED ORDER — PANTOPRAZOLE SODIUM 40 MG PO TBEC
40.0000 mg | DELAYED_RELEASE_TABLET | Freq: Every day | ORAL | Status: DC
  Administered 2022-10-23 – 2022-10-25 (×3): 40 mg via ORAL
  Filled 2022-10-23 (×3): qty 1

## 2022-10-23 MED ORDER — IOHEXOL 350 MG/ML SOLN
60.0000 mL | Freq: Once | INTRAVENOUS | Status: AC | PRN
  Administered 2022-10-23: 60 mL via INTRAVENOUS

## 2022-10-23 MED ORDER — ONDANSETRON HCL 4 MG/2ML IJ SOLN: 4.0000 mg | Freq: Four times a day (QID) | INTRAMUSCULAR | Status: DC | PRN

## 2022-10-23 NOTE — Progress Notes (Signed)
STROKE TEAM PROGRESS NOTE   INTERVAL HISTORY  Husband at bedside. Appears to be doing well.  She has some blurred vision on the left eye and is unable to keep a close.   Vitals:   10/23/22 0500 10/23/22 0753 10/23/22 1300 10/23/22 1537  BP:  (!) 147/91 129/84 117/77  Pulse:  95 (!) 102 (!) 110  Resp:      Temp:      TempSrc:      SpO2:  96% 95% 95%  Weight: 70.9 kg     Height:       CBC:  Recent Labs  Lab 10/22/22 0914 10/23/22 0542  WBC 5.7 7.6  NEUTROABS 3.7 4.9  HGB 12.9 11.6*  HCT 38.4 35.5*  MCV 88.5 87.7  PLT 193 207   Basic Metabolic Panel:  Recent Labs  Lab 10/22/22 0914 10/23/22 0542  NA 140 138  K 3.5 3.6  CL 102 102  CO2 24 25  GLUCOSE 116* 116*  BUN 12 11  CREATININE 0.70 0.61  CALCIUM 8.7* 8.6*  MG 1.9 2.0  PHOS 3.2 3.9   Lipid Panel:  No results for input(s): "CHOL", "TRIG", "HDL", "CHOLHDL", "VLDL", "LDLCALC" in the last 168 hours.  HgbA1c:  No results for input(s): "HGBA1C" in the last 168 hours.   IMAGING past 24 hours No results found.  PHYSICAL EXAM Physical Exam  Constitutional: Appears well-developed and well-nourished. Laying in bed. Psych: Affect appropriate to situation HENT: Normocephalic.  Core track tube in place.  Musculoskeletal-no joint tenderness, deformity or swelling Cardiovascular: Normal rate and regular rhythm.  Respiratory: Effort normal, non-labored GI: Soft.  No distension. There is no tenderness.  Skin: WDI   Neuro:   MS: alert, awake. Oriented to person, place time.  Follows all commands, no aphasia. Dysarthria present.  CN: PERRL, EOMI. Left upper and lower facial droop severe.  She is unable to close her left eye appears red and irritated.  Tongue midline. No visual field cut.  Motor: able to lift all extemities but drift in left arm/leg.  Ataxia: present in left arm/leg.  Sensation: intact.     ASSESSMENT/PLAN Joan Robinson is a 62 y.o. female with past medical history of esophageal strictures  status post dilation, multiple hernias status post multiple surgeries, GERD/PUD, hypertension, hyperlipidemia, autoimmune urticaria, emphysema, sleep apnea who presents to the ED with right upper quadrant and right lower quadrant abdominal pain x 2 days associated nausea.  She was found to have small bowel obstruction and was being managed inpatient at [facility].  Then, noted to have left facial droop, slurring of her speech, and CT head without contrast which was notable for a moderately large left PICA territory infarct. CT angio head and neck with and without contrast was negative for any large vessel occlusion.  Specifically, no basilar occlusion or thrombosis noted.  CT perfusion with no core or mismatch noted.  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 Confluent cytotoxic edema but no hemorrhagic transformation, no interval extension, and no significant posterior fossa mass effect. CTA head & neck left AICA not visualized Ct perfusion: no core MRI Large acute infarct (affecting both the left PICA and left AICA vascular territories). Posterior fossa mass effect with effacement of the fourth ventricle inferiorly. No evidence of obstructive hydrocephalus at this time.  CT repeat 10/2 showed unchanged appearance of left cerebellar infarct.  No acute hemorrhage.  Repeat CT 10/4-stable 2D Echo EF 60-65% LDL 54 HgbA1c 5.6 VTE prophylaxis - heparin subcutaneous  No antithrombotic prior to admission, con' t Plavix. ASA allergy Therapy recommendations:  CIR Disposition:  pending  Cerebellar edema Mild mass effect to compress on the brainstem Risk of developing hydrocephalus On 3% saline @75 ->NS @75 ->3% saline @ 25 Need close clinical and imaging monitoring CT repeat 10/2 stable infarct, no hemorrhage, no hydrocephalus CT repeat stable. Sodium  150-153-154-156-153-152->148>150>143 Sodium check every 6 hours  SBO Pt admitted for abdominal pain x 2 days with N/V Found to have SBO, may related to her previous multiple surgeries for hernia Surgery on board Now NPO with NG suction KUB - Dilated loops of large bowel seen in the right upper quadrant and multiple dilated loops of small bowel, 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 CT abd/pelvis progression of SBO Status post surgery treatment 10/1   Hypertension Home meds:  none Stable Long-term BP goal normotensive  Hyperlipidemia Home meds:  crestor 20mg   LDL 54, goal < 70 Con't  home crestor 20  Continue statin at discharge  FEN:  TF @20 . Increase to 50 now with goal of 60. Move by 10 q 6hrs.  Seen by speech today MBS completed advance to dysphagia 1 pured diet continue to monitor  Other Stroke Risk Factors Sleep apnea, not on CPAP at home  Other Active Problems Esophageal stricture s/p dilation  PUD multiple hernias status post multiple surgeries  GERD Autoimmune urticaria  Emphysema  Left eye irritation-recommend wearing eye patch at night and lubricating drops.  Discussed with Dr. Lowell Guitar.  Hospital day # 9  Overall doing well.  Eye patch day and night. Con't ggt.  Needs PEG. IR consulted.  Frenchie Dangerfield,MD   To contact Stroke Continuity provider, please refer to [email or web address]. After hours, contact General Neurology

## 2022-10-23 NOTE — Progress Notes (Signed)
Liver Function Tests: Recent Labs  Lab 10/17/22 0629 10/18/22 0957 10/22/22 0914 10/23/22 0542  AST 18 16 26 24   ALT 16 17 26 25   ALKPHOS 60 56 85 84  BILITOT 0.8 0.5 0.6 0.7  PROT 6.2* 6.0* 6.7 6.2*  ALBUMIN 3.0* 2.8* 3.0* 2.9*    CBG: Recent Labs  Lab 10/22/22 1222 10/22/22 1703 10/23/22 0131 10/23/22 0752 10/23/22 1353  GLUCAP 114* 170* 123* 120* 120*     Recent Results (from the past 240 hour(s))  MRSA Next Gen by PCR, Nasal     Status: None   Collection Time: 10/14/22 10:29 PM   Specimen: Nasal Mucosa; Nasal Swab  Result Value Ref Range Status   MRSA by PCR Next Gen NOT DETECTED NOT DETECTED Final    Comment: (NOTE) The GeneXpert MRSA Assay (FDA approved for NASAL specimens only), is one component of Tryce Surratt comprehensive MRSA colonization surveillance program. It is not intended to diagnose MRSA infection nor to guide or monitor treatment for MRSA infections. Test performance is not FDA approved in patients less than 96 years old. Performed at [facility], [address], [place], [place] [address]   MRSA Next Gen by PCR, Nasal     Status: None   Collection Time: 10/15/22 12:41 AM   Specimen: Nasal Mucosa; Nasal Swab  Result Value Ref Range Status   MRSA by PCR Next Gen NOT DETECTED NOT DETECTED Final    Comment: (NOTE) The GeneXpert MRSA Assay (FDA approved for NASAL specimens only), is one component of Roth Ress comprehensive MRSA colonization surveillance program. It is not intended to diagnose MRSA infection nor to guide or monitor treatment for MRSA infections. Test performance is not FDA approved in patients less than 70 years old. Performed at [facility], [address], [place], [place] [address]           Radiology Studies: DG Swallowing Func-Speech Pathology  Result Date: 10/22/2022 Table formatting from the original result was not included. Modified Barium Swallow Study Patient Details Name: BEVAN VU MRN: [id number] Date of Birth: November 18, 1960 Today's Date: 10/22/2022 HPI/PMH: HPI: 62 yo admitted 9/28 with RUQ pain with SBO.  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 PMhx: HTN, HLD, peptic ulcer disease, diverticulitis, bipolar disorder, hypothyroidism, GERD, Hashimoto's disease. Hemicolectomy completed on 10/1. Clinical Impression: Clinical Impression: Pt presents with Brant Peets neurogenic dysphagia. Oral phase is c/b poor bolus awareness and control with anterior loss from right side as well as posterior spillage into pharynx.  When pt was cued to actively accept spoon into mouth and seal her lips, she demonstrated improved control.  There was intermittent spilling of trace amounts of thin and nectar thick liquid into the airway - either before the onset of the pharyngeal or from residue after - but aspiration did not elicit Osmara Drummonds cough response.  There was adequate laryngeal mobility.  There was minimal residue overall.  Given concern at this time for silent aspiration, recommend starting conservative diet of dysphagia 1/honey thick liquids.  Eating will require great effort and concentration for Ms. Gaunt  at this time, so I would not anticipate ample PO intake. Recommend continuing TF. SLP will follow for dysphagia tx. Factors that may increase risk of adverse event in presence of aspiration [name] & [name] 2021): Factors that may increase risk of adverse event in presence of aspiration [name] & [name] 2021): Weak cough Recommendations/Plan: Swallowing Evaluation Recommendations Swallowing Evaluation Recommendations Recommendations:  Liver Function Tests: Recent Labs  Lab 10/17/22 0629 10/18/22 0957 10/22/22 0914 10/23/22 0542  AST 18 16 26 24   ALT 16 17 26 25   ALKPHOS 60 56 85 84  BILITOT 0.8 0.5 0.6 0.7  PROT 6.2* 6.0* 6.7 6.2*  ALBUMIN 3.0* 2.8* 3.0* 2.9*    CBG: Recent Labs  Lab 10/22/22 1222 10/22/22 1703 10/23/22 0131 10/23/22 0752 10/23/22 1353  GLUCAP 114* 170* 123* 120* 120*     Recent Results (from the past 240 hour(s))  MRSA Next Gen by PCR, Nasal     Status: None   Collection Time: 10/14/22 10:29 PM   Specimen: Nasal Mucosa; Nasal Swab  Result Value Ref Range Status   MRSA by PCR Next Gen NOT DETECTED NOT DETECTED Final    Comment: (NOTE) The GeneXpert MRSA Assay (FDA approved for NASAL specimens only), is one component of Tryce Surratt comprehensive MRSA colonization surveillance program. It is not intended to diagnose MRSA infection nor to guide or monitor treatment for MRSA infections. Test performance is not FDA approved in patients less than 96 years old. Performed at [facility], [address], [place], [place] [address]   MRSA Next Gen by PCR, Nasal     Status: None   Collection Time: 10/15/22 12:41 AM   Specimen: Nasal Mucosa; Nasal Swab  Result Value Ref Range Status   MRSA by PCR Next Gen NOT DETECTED NOT DETECTED Final    Comment: (NOTE) The GeneXpert MRSA Assay (FDA approved for NASAL specimens only), is one component of Roth Ress comprehensive MRSA colonization surveillance program. It is not intended to diagnose MRSA infection nor to guide or monitor treatment for MRSA infections. Test performance is not FDA approved in patients less than 70 years old. Performed at [facility], [address], [place], [place] [address]           Radiology Studies: DG Swallowing Func-Speech Pathology  Result Date: 10/22/2022 Table formatting from the original result was not included. Modified Barium Swallow Study Patient Details Name: BEVAN VU MRN: [id number] Date of Birth: November 18, 1960 Today's Date: 10/22/2022 HPI/PMH: HPI: 62 yo admitted 9/28 with RUQ pain with SBO.  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 PMhx: HTN, HLD, peptic ulcer disease, diverticulitis, bipolar disorder, hypothyroidism, GERD, Hashimoto's disease. Hemicolectomy completed on 10/1. Clinical Impression: Clinical Impression: Pt presents with Brant Peets neurogenic dysphagia. Oral phase is c/b poor bolus awareness and control with anterior loss from right side as well as posterior spillage into pharynx.  When pt was cued to actively accept spoon into mouth and seal her lips, she demonstrated improved control.  There was intermittent spilling of trace amounts of thin and nectar thick liquid into the airway - either before the onset of the pharyngeal or from residue after - but aspiration did not elicit Osmara Drummonds cough response.  There was adequate laryngeal mobility.  There was minimal residue overall.  Given concern at this time for silent aspiration, recommend starting conservative diet of dysphagia 1/honey thick liquids.  Eating will require great effort and concentration for Ms. Gaunt  at this time, so I would not anticipate ample PO intake. Recommend continuing TF. SLP will follow for dysphagia tx. Factors that may increase risk of adverse event in presence of aspiration [name] & [name] 2021): Factors that may increase risk of adverse event in presence of aspiration [name] & [name] 2021): Weak cough Recommendations/Plan: Swallowing Evaluation Recommendations Swallowing Evaluation Recommendations Recommendations:  Liver Function Tests: Recent Labs  Lab 10/17/22 0629 10/18/22 0957 10/22/22 0914 10/23/22 0542  AST 18 16 26 24   ALT 16 17 26 25   ALKPHOS 60 56 85 84  BILITOT 0.8 0.5 0.6 0.7  PROT 6.2* 6.0* 6.7 6.2*  ALBUMIN 3.0* 2.8* 3.0* 2.9*    CBG: Recent Labs  Lab 10/22/22 1222 10/22/22 1703 10/23/22 0131 10/23/22 0752 10/23/22 1353  GLUCAP 114* 170* 123* 120* 120*     Recent Results (from the past 240 hour(s))  MRSA Next Gen by PCR, Nasal     Status: None   Collection Time: 10/14/22 10:29 PM   Specimen: Nasal Mucosa; Nasal Swab  Result Value Ref Range Status   MRSA by PCR Next Gen NOT DETECTED NOT DETECTED Final    Comment: (NOTE) The GeneXpert MRSA Assay (FDA approved for NASAL specimens only), is one component of Tryce Surratt comprehensive MRSA colonization surveillance program. It is not intended to diagnose MRSA infection nor to guide or monitor treatment for MRSA infections. Test performance is not FDA approved in patients less than 96 years old. Performed at [facility], [address], [place], [place] [address]   MRSA Next Gen by PCR, Nasal     Status: None   Collection Time: 10/15/22 12:41 AM   Specimen: Nasal Mucosa; Nasal Swab  Result Value Ref Range Status   MRSA by PCR Next Gen NOT DETECTED NOT DETECTED Final    Comment: (NOTE) The GeneXpert MRSA Assay (FDA approved for NASAL specimens only), is one component of Roth Ress comprehensive MRSA colonization surveillance program. It is not intended to diagnose MRSA infection nor to guide or monitor treatment for MRSA infections. Test performance is not FDA approved in patients less than 70 years old. Performed at [facility], [address], [place], [place] [address]           Radiology Studies: DG Swallowing Func-Speech Pathology  Result Date: 10/22/2022 Table formatting from the original result was not included. Modified Barium Swallow Study Patient Details Name: BEVAN VU MRN: [id number] Date of Birth: November 18, 1960 Today's Date: 10/22/2022 HPI/PMH: HPI: 62 yo admitted 9/28 with RUQ pain with SBO.  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 PMhx: HTN, HLD, peptic ulcer disease, diverticulitis, bipolar disorder, hypothyroidism, GERD, Hashimoto's disease. Hemicolectomy completed on 10/1. Clinical Impression: Clinical Impression: Pt presents with Brant Peets neurogenic dysphagia. Oral phase is c/b poor bolus awareness and control with anterior loss from right side as well as posterior spillage into pharynx.  When pt was cued to actively accept spoon into mouth and seal her lips, she demonstrated improved control.  There was intermittent spilling of trace amounts of thin and nectar thick liquid into the airway - either before the onset of the pharyngeal or from residue after - but aspiration did not elicit Osmara Drummonds cough response.  There was adequate laryngeal mobility.  There was minimal residue overall.  Given concern at this time for silent aspiration, recommend starting conservative diet of dysphagia 1/honey thick liquids.  Eating will require great effort and concentration for Ms. Gaunt  at this time, so I would not anticipate ample PO intake. Recommend continuing TF. SLP will follow for dysphagia tx. Factors that may increase risk of adverse event in presence of aspiration [name] & [name] 2021): Factors that may increase risk of adverse event in presence of aspiration [name] & [name] 2021): Weak cough Recommendations/Plan: Swallowing Evaluation Recommendations Swallowing Evaluation Recommendations Recommendations:  Liver Function Tests: Recent Labs  Lab 10/17/22 0629 10/18/22 0957 10/22/22 0914 10/23/22 0542  AST 18 16 26 24   ALT 16 17 26 25   ALKPHOS 60 56 85 84  BILITOT 0.8 0.5 0.6 0.7  PROT 6.2* 6.0* 6.7 6.2*  ALBUMIN 3.0* 2.8* 3.0* 2.9*    CBG: Recent Labs  Lab 10/22/22 1222 10/22/22 1703 10/23/22 0131 10/23/22 0752 10/23/22 1353  GLUCAP 114* 170* 123* 120* 120*     Recent Results (from the past 240 hour(s))  MRSA Next Gen by PCR, Nasal     Status: None   Collection Time: 10/14/22 10:29 PM   Specimen: Nasal Mucosa; Nasal Swab  Result Value Ref Range Status   MRSA by PCR Next Gen NOT DETECTED NOT DETECTED Final    Comment: (NOTE) The GeneXpert MRSA Assay (FDA approved for NASAL specimens only), is one component of Tryce Surratt comprehensive MRSA colonization surveillance program. It is not intended to diagnose MRSA infection nor to guide or monitor treatment for MRSA infections. Test performance is not FDA approved in patients less than 96 years old. Performed at [facility], [address], [place], [place] [address]   MRSA Next Gen by PCR, Nasal     Status: None   Collection Time: 10/15/22 12:41 AM   Specimen: Nasal Mucosa; Nasal Swab  Result Value Ref Range Status   MRSA by PCR Next Gen NOT DETECTED NOT DETECTED Final    Comment: (NOTE) The GeneXpert MRSA Assay (FDA approved for NASAL specimens only), is one component of Roth Ress comprehensive MRSA colonization surveillance program. It is not intended to diagnose MRSA infection nor to guide or monitor treatment for MRSA infections. Test performance is not FDA approved in patients less than 70 years old. Performed at [facility], [address], [place], [place] [address]           Radiology Studies: DG Swallowing Func-Speech Pathology  Result Date: 10/22/2022 Table formatting from the original result was not included. Modified Barium Swallow Study Patient Details Name: BEVAN VU MRN: [id number] Date of Birth: November 18, 1960 Today's Date: 10/22/2022 HPI/PMH: HPI: 62 yo admitted 9/28 with RUQ pain with SBO.  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 PMhx: HTN, HLD, peptic ulcer disease, diverticulitis, bipolar disorder, hypothyroidism, GERD, Hashimoto's disease. Hemicolectomy completed on 10/1. Clinical Impression: Clinical Impression: Pt presents with Brant Peets neurogenic dysphagia. Oral phase is c/b poor bolus awareness and control with anterior loss from right side as well as posterior spillage into pharynx.  When pt was cued to actively accept spoon into mouth and seal her lips, she demonstrated improved control.  There was intermittent spilling of trace amounts of thin and nectar thick liquid into the airway - either before the onset of the pharyngeal or from residue after - but aspiration did not elicit Osmara Drummonds cough response.  There was adequate laryngeal mobility.  There was minimal residue overall.  Given concern at this time for silent aspiration, recommend starting conservative diet of dysphagia 1/honey thick liquids.  Eating will require great effort and concentration for Ms. Gaunt  at this time, so I would not anticipate ample PO intake. Recommend continuing TF. SLP will follow for dysphagia tx. Factors that may increase risk of adverse event in presence of aspiration [name] & [name] 2021): Factors that may increase risk of adverse event in presence of aspiration [name] & [name] 2021): Weak cough Recommendations/Plan: Swallowing Evaluation Recommendations Swallowing Evaluation Recommendations Recommendations:  PROGRESS NOTE    Loreena Valeri Lierman  WUJ:[id number] DOB: [birth year] DOA: 10/13/2022 PCP: April Manson, NP  Chief Complaint  Patient presents with   Abdominal Pain    Brief Narrative:   62 y.o. female with past medical history of esophageal strictures status post dilation, multiple hernias status post multiple surgeries, GERD/PUD, hypertension, hyperlipidemia, autoimmune urticaria, emphysema, sleep apnea who presented to the ED with right upper quadrant and right lower quadrant abdominal pain x 2 days associated nausea. She was found to have small bowel obstruction and was being managed inpatient at [facility]. Then, noted to have left facial droop, slurring of her speech, and CT head without contrast which was notable for Kaymen Adrian moderately large left PICA territory infarct. She was transferred to ICU at [facility] and managed for stroke.  She's now s/p open right hemicolectomy on 10/1.  Transferred to [facility] on 10/7.  See previous notes for additional details.   Assessment & Plan:   Principal Problem:   SBO (small bowel obstruction) (HCC) Active Problems:   Hypothyroidism   Hyperlipidemia   Essential hypertension   GERD (gastroesophageal reflux disease)   History of esophageal stricture   Chronic anemia   Sleep apnea   Diverticulosis   History of emphysema (HCC)   Protein-calorie malnutrition, severe  Acute Stroke Cerebellar Edema  MRI 9/30 with large acute infarct affecting both L PICA and L AICA vascular territories.  Posterio fossa mass effect with effacement of the 4th ventricle inferiorly.  No evidence of obstructive hydrocephalus.  Mild inferior displacement of the ceebellar tonsils.  No more than mild mass effect upon the medulla. Serial head CT 10/2 showed unchanged appearance of L cerebellar infarct  10/4 showed stable size of L anterior inferior cerebellar hypodense nonhemorrhagic infarct - local mass efffect with effacement of the sulci - some mass effect on the 4th ventricle  without obstruction Echo with EF 60-65%, no RWMA ]LDL 54, A1c 5.6 Appreciate neurology assistance - etiology unclear, recommending plavix (aspirin allergy), crestor -> IR eval and management for PEG per neurology -> repeat CT abd/pelvis pending S/p 3% saline  SBO Cecal Volvulus s/p Laparoscopic converted to open right hemicolectomy 10/1  Appreciate surgery assistance Tolerating tube feeds  Neurogenic Dysphagia SLP recommending dysphagia 1, moderately thick liquids (honey thick)  Incomplete Left Eye Closure Continue eyedrops/ointment Nighttime patch   GERD PPI  Glaucoma Eye drops  Dyslipidemia Crestor   Hypothyroidism Synthroid   Insomnia Melatonin/norco at night  Holding home vyvanse    DVT prophylaxis: heparin Code Status: full Family Communication: daughter Disposition:   Status is: Inpatient Remains inpatient appropriate because: need for continued inpatient care   Consultants:  Surgery neurology  Procedures:   Laparoscopic converted to open right hemicolectomy  10/1  Echo   IMPRESSIONS     1. Left ventricular ejection fraction, by estimation, is 60 to 65%. The  left ventricle has normal function. The left ventricle has no regional  wall motion abnormalities. Left ventricular diastolic parameters were  normal.   2. Right ventricular systolic function is normal. The right ventricular  size is normal.   3. The mitral valve is normal in structure. Trivial mitral valve  regurgitation. No evidence of mitral stenosis.   4. The aortic valve is normal in structure. Aortic valve regurgitation is  not visualized. No aortic stenosis is present.  Antimicrobials:  Anti-infectives (From admission, onward)    Start     Dose/Rate Route Frequency Ordered Stop   10/16/22 2200  cefoTEtan (

## 2022-10-23 NOTE — Plan of Care (Signed)
  Problem: Education: Goal: Knowledge of General Education information will improve Description: Including pain rating scale, medication(s)/side effects and non-pharmacologic comfort measures Outcome: Progressing   Problem: Health Behavior/Discharge Planning: Goal: Ability to manage health-related needs will improve Outcome: Progressing   Problem: Clinical Measurements: Goal: Ability to maintain clinical measurements within normal limits will improve Outcome: Progressing Goal: Will remain free from infection Outcome: Progressing Goal: Diagnostic test results will improve Outcome: Progressing Goal: Respiratory complications will improve Outcome: Progressing Goal: Cardiovascular complication will be avoided Outcome: Progressing   Problem: Activity: Goal: Risk for activity intolerance will decrease Outcome: Progressing   Problem: Nutrition: Goal: Adequate nutrition will be maintained Outcome: Progressing   Problem: Coping: Goal: Level of anxiety will decrease Outcome: Progressing   Problem: Elimination: Goal: Will not experience complications related to bowel motility Outcome: Progressing Goal: Will not experience complications related to urinary retention Outcome: Progressing   Problem: Pain Managment: Goal: General experience of comfort will improve Outcome: Progressing   Problem: Safety: Goal: Ability to remain free from injury will improve Outcome: Progressing   Problem: Skin Integrity: Goal: Risk for impaired skin integrity will decrease Outcome: Progressing   Problem: Education: Goal: Knowledge of General Education information will improve Description: Including pain rating scale, medication(s)/side effects and non-pharmacologic comfort measures Outcome: Progressing   Problem: Health Behavior/Discharge Planning: Goal: Ability to manage health-related needs will improve Outcome: Progressing   Problem: Clinical Measurements: Goal: Ability to maintain  clinical measurements within normal limits will improve Outcome: Progressing Goal: Will remain free from infection Outcome: Progressing Goal: Diagnostic test results will improve Outcome: Progressing Goal: Respiratory complications will improve Outcome: Progressing Goal: Cardiovascular complication will be avoided Outcome: Progressing   Problem: Activity: Goal: Risk for activity intolerance will decrease Outcome: Progressing   Problem: Nutrition: Goal: Adequate nutrition will be maintained Outcome: Progressing   Problem: Coping: Goal: Level of anxiety will decrease Outcome: Progressing   Problem: Elimination: Goal: Will not experience complications related to bowel motility Outcome: Progressing Goal: Will not experience complications related to urinary retention Outcome: Progressing   Problem: Pain Managment: Goal: General experience of comfort will improve Outcome: Progressing   Problem: Safety: Goal: Ability to remain free from injury will improve Outcome: Progressing   Problem: Skin Integrity: Goal: Risk for impaired skin integrity will decrease Outcome: Progressing   Problem: Education: Goal: Knowledge of disease or condition will improve Outcome: Progressing Goal: Knowledge of secondary prevention will improve (MUST DOCUMENT ALL) Outcome: Progressing Goal: Knowledge of patient specific risk factors will improve Loraine Leriche N/A or DELETE if not current risk factor) Outcome: Progressing   Problem: Ischemic Stroke/TIA Tissue Perfusion: Goal: Complications of ischemic stroke/TIA will be minimized Outcome: Progressing   Problem: Coping: Goal: Will verbalize positive feelings about self Outcome: Progressing Goal: Will identify appropriate support needs Outcome: Progressing   Problem: Health Behavior/Discharge Planning: Goal: Ability to manage health-related needs will improve Outcome: Progressing Goal: Goals will be collaboratively established with  patient/family Outcome: Progressing   Problem: Self-Care: Goal: Ability to participate in self-care as condition permits will improve Outcome: Progressing Goal: Verbalization of feelings and concerns over difficulty with self-care will improve Outcome: Progressing Goal: Ability to communicate needs accurately will improve Outcome: Progressing   Problem: Nutrition: Goal: Risk of aspiration will decrease Outcome: Progressing Goal: Dietary intake will improve Outcome: Progressing

## 2022-10-23 NOTE — Progress Notes (Signed)
PHARMACIST - PHYSICIAN COMMUNICATION  DR:   Florene Glen  CONCERNING: IV to Oral Route Change Policy  RECOMMENDATION: This patient is receiving Protonix by the intravenous route.  Based on criteria approved by the Pharmacy and Therapeutics Committee, the intravenous medication(s) is/are being converted to the equivalent oral dose form(s).   DESCRIPTION: These criteria include: The patient is eating (either orally or via tube) and/or has been taking other orally administered medications for a least 24 hours The patient has no evidence of active gastrointestinal bleeding or impaired GI absorption (gastrectomy, short bowel, patient on TNA or NPO).  If you have questions about this conversion, please contact the Pharmacy Department  []   [phone] )  [facility] []   [phone] )  [facility] [x]   [phone] )  [facility] []   [phone] )  [facility] []   [phone] )  [facility]

## 2022-10-23 NOTE — Progress Notes (Signed)
Patient ID: Joan Robinson, female   DOB: [birth year], 62 y.o.   MRN: [id number] [facility] Surgery Progress Note  7 Days Post-Op  Subjective: Tolerating TF's at 53ml/hr without any abdominal pain, n/v. Started on D1/honey thick yesterday per SLP. Having bowel function.   Objective: Vital signs in last 24 hours: Temp:  [97.8 F (36.6 C)-98.2 F (36.8 C)] 97.9 F (36.6 C) (10/08 0408) Pulse Rate:  [95-111] 95 (10/08 0753) Resp:  [18] 18 (10/08 0408) BP: (129-147)/(91-98) 147/91 (10/08 0753) SpO2:  [92 %-96 %] 96 % (10/08 0753) Weight:  [70.9 kg] 70.9 kg (10/08 0500) Last BM Date : 10/23/22  Intake/Output from previous day: 10/07 0701 - 10/08 0700 In: -  Out: 800 [Urine:800] Intake/Output this shift: No intake/output data recorded.  PE: General: Alert, NAD Abd: Soft, ND, NT, +BS. Midline wound cdi with staples in place. Laparoscopic site cdi with staple in place  Lab Results:  Recent Labs    10/22/22 0914 10/23/22 0542  WBC 5.7 7.6  HGB 12.9 11.6*  HCT 38.4 35.5*  PLT 193 207   BMET Recent Labs    10/22/22 0914 10/23/22 0542  NA 140 138  K 3.5 3.6  CL 102 102  CO2 24 25  GLUCOSE 116* 116*  BUN 12 11  CREATININE 0.70 0.61  CALCIUM 8.7* 8.6*   PT/INR No results for input(s): "LABPROT", "INR" in the last 72 hours. CMP     Component Value Date/Time   NA 138 10/23/2022 0542   K 3.6 10/23/2022 0542   CL 102 10/23/2022 0542   CO2 25 10/23/2022 0542   GLUCOSE 116 (H) 10/23/2022 0542   BUN 11 10/23/2022 0542   CREATININE 0.61 10/23/2022 0542   CREATININE 0.91 09/02/2015 1352   CALCIUM 8.6 (L) 10/23/2022 0542   PROT 6.2 (L) 10/23/2022 0542   ALBUMIN 2.9 (L) 10/23/2022 0542   AST 24 10/23/2022 0542   ALT 25 10/23/2022 0542   ALKPHOS 84 10/23/2022 0542   BILITOT 0.7 10/23/2022 0542   GFRNONAA >60 10/23/2022 0542   GFRAA  12/16/2009 2144    >60        The eGFR has been calculated using the MDRD equation. This calculation has not been validated in  all clinical situations. eGFR's persistently <60 mL/min signify possible Chronic Kidney Disease.   Lipase     Component Value Date/Time   LIPASE 10 (L) 10/13/2022 2116       Studies/Results: DG Swallowing Func-Speech Pathology  Result Date: 10/22/2022 Table formatting from the original result was not included. Modified Barium Swallow Study Patient Details Name: Joan Robinson MRN: [id number] Date of Birth: [birth year] Today's Date: 10/22/2022 HPI/PMH: HPI: 62 yo admitted 9/28 with RUQ pain with SBO.  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 PMhx: HTN, HLD, peptic ulcer disease, diverticulitis, bipolar disorder, hypothyroidism, GERD, Hashimoto's disease. Hemicolectomy completed on 10/1. Clinical Impression: Clinical Impression: Pt presents with a neurogenic dysphagia. Oral phase is c/b poor bolus awareness and control with anterior loss from right side as well as posterior spillage into pharynx.  When pt was cued to actively accept spoon into mouth and seal her lips, she demonstrated improved control.  There was intermittent spilling of trace amounts of thin and nectar thick liquid into the airway - either before the onset of the pharyngeal or from residue after - but aspiration did not elicit a cough response.  There was adequate laryngeal mobility.  There  was minimal residue overall.  Given concern at this time for silent aspiration, recommend starting conservative diet of dysphagia 1/honey thick liquids.  Eating will require great effort and concentration for Joan Robinson  at this time, so I would not anticipate ample PO intake. Recommend continuing TF. SLP will follow for dysphagia tx. Factors that may increase risk of adverse event in presence of aspiration [name] & [name] 2021): Factors that may increase risk of adverse event in presence of aspiration [name] & [name] 2021): Weak cough Recommendations/Plan: Swallowing  Evaluation Recommendations Swallowing Evaluation Recommendations Recommendations: PO diet PO Diet Recommendation: Dysphagia 1 (Pureed); Moderately thick liquids (Level 3, honey thick) Liquid Administration via: Cup; Spoon Medication Administration: Crushed with puree Supervision: Staff to assist with self-feeding Swallowing strategies  : Slow rate; Check for pocketing or oral holding; Check for anterior loss Oral care recommendations: Oral care BID (2x/day) Treatment Plan Treatment Plan Treatment recommendations: Therapy as outlined in treatment plan below Functional status assessment: Patient has had a recent decline in their functional status and demonstrates the ability to make significant improvements in function in a reasonable and predictable amount of time. Treatment frequency: Min 2x/week Treatment duration: 2 weeks Interventions: Patient/family education; Trials of upgraded texture/liquids; Diet toleration management by SLP; Oropharyngeal exercises Recommendations Recommendations for follow up therapy are one component of a multi-disciplinary discharge planning process, led by the attending physician.  Recommendations may be updated based on patient status, additional functional criteria and insurance authorization. Assessment: Orofacial Exam: Orofacial Exam Oral Cavity: Oral Hygiene: WFL Oral Cavity - Dentition: Poor condition; Missing dentition Anatomy: Anatomy: WFL Boluses Administered: Boluses Administered Boluses Administered: Thin liquids (Level 0); Mildly thick liquids (Level 2, nectar thick); Moderately thick liquids (Level 3, honey thick); Puree  Oral Impairment Domain: Oral Impairment Domain Lip Closure: Escape beyond mid-chin Tongue control during bolus hold: Posterior escape of greater than half of bolus Bolus preparation/mastication: Slow prolonged chewing/mashing with complete recollection Initiation of pharyngeal swallow : Pyriform sinuses  Pharyngeal Impairment Domain: Pharyngeal Impairment  Domain Laryngeal elevation: Partial superior movement of thyroid cartilage/partial approximation of arytenoids to epiglottic petiole Anterior hyoid excursion: Partial anterior movement Epiglottic movement: Complete inversion Laryngeal vestibule closure: Incomplete, narrow column air/contrast in laryngeal vestibule Pharyngeal stripping wave : Present - diminished Pharyngeal contraction (A/P view only): N/A Pharyngoesophageal segment opening: Partial distention/partial duration, partial obstruction of flow Tongue base retraction: Trace column of contrast or air between tongue base and PPW Pharyngeal residue: Trace residue within or on pharyngeal structures Location of pharyngeal residue: Valleculae  Esophageal Impairment Domain: No data recorded Pill: Pill Consistency administered: -- (not tested) Penetration/Aspiration Scale Score: Penetration/Aspiration Scale Score 1.  Material does not enter airway: Puree; Moderately thick liquids (Level 3, honey thick) 8.  Material enters airway, passes BELOW cords without attempt by patient to eject out (silent aspiration) : Thin liquids (Level 0); Mildly thick liquids (Level 2, nectar thick) Compensatory Strategies: Compensatory Strategies Compensatory strategies: Yes Straw: Ineffective Ineffective Straw: Thin liquid (Level 0); Mildly thick liquid (Level 2, nectar thick) Chin tuck: Ineffective Ineffective Chin Tuck: Mildly thick liquid (Level 2, nectar thick) Left head turn: Ineffective   General Information: Caregiver present: No  Diet Prior to this Study: NPO; Cortrak/Small bore NG tube   Temperature : Normal   Respiratory Status: WFL   Supplemental O2: Nasal cannula   History of Recent Intubation: Yes  Behavior/Cognition: Alert; Cooperative; Pleasant mood Self-Feeding Abilities: Needs assist with self-feeding Baseline vocal quality/speech: Normal Volitional Cough: Able to elicit No data recorded Exam  Limitations: No limitations Goal Planning: Prognosis for improved  oropharyngeal function: Good No data recorded No data recorded No data recorded No data recorded Pain: Pain Assessment Pain Assessment: No/denies pain Faces Pain Scale: 4 Pain Location: bottom with peri-care Pain Descriptors / Indicators: Sore; Discomfort; Tender Pain Intervention(s): Monitored during session; Limited activity within patient's tolerance; Repositioned End of Session: Start Time:SLP Start Time (ACUTE ONLY): 1247 Stop Time: SLP Stop Time (ACUTE ONLY): 1306 Time Calculation:SLP Time Calculation (min) (ACUTE ONLY): 19 min Charges: SLP Evaluations $ SLP Speech Visit: 1 Visit SLP Evaluations $MBS Swallow: 1 Procedure $Swallowing Treatment: 1 Procedure SLP visit diagnosis: SLP Visit Diagnosis: Dysphagia, oropharyngeal phase (R13.12) Past Medical History: Past Medical History: Diagnosis Date  Allergic rhinitis   Anemia 10/10/2011  Anxiety and depression 01/17/2007  Qualifier: Diagnosis of  By: Everardo All MD, Sean A   Arthritis 07/24/2013  Likely inflammatory and following with Dr Maryln Gottron of [facility]  rheumatology  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  BCC (basal cell carcinoma of skin) 06/01/2012  Leg Follows with Dr Margo Aye  Bipolar disorder (HCC) 01/17/2007  Qualifier: Diagnosis of  By: Everardo All MD, Sean A   Cataract   Diverticulosis   Diverticulosis   Emphysema of lung (HCC)   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  Gallstones   GERD (gastroesophageal reflux disease)   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  Hashimoto's disease   Hyperlipidemia   Hypertension   Hypothyroidism 08/24/2006  Qualifier: Diagnosis of  By: Everardo All MD, Sean A    IBS (irritable bowel syndrome) 07/27/2016  Obesity 11/01/2013  PUD (peptic ulcer disease)   Sleep apnea 04/27/2016  Tobacco abuse  Past Surgical History: Past Surgical History: Procedure Laterality Date  ABDOMINAL HYSTERECTOMY    ABDOMINAL HYSTERECTOMY  01/15/2009  complete  COLONOSCOPY    DILATION AND CURETTAGE OF UTERUS  01/16/1983  EYE SURGERY  03/03/2014  Surgery on both eyes for epiretinal  membrane (vitreous peel)  Gated Spect wall motion stress cardiolite  11/05/2001  HIATAL HERNIA REPAIR N/A 07/12/2021  Procedure: LAPAROSCOPY W/ EXTENSIVE FOREGUT DISSECTION; PARTIAL STOMACH REDUCTION; GASTROSTOMY TUBE PLACEMENT; GASTROPEXY;  Surgeon: Luretha Murphy, MD;  Location: [facility] ORS;  Service: General;  Laterality: N/A;  LAPAROSCOPIC RIGHT HEMI COLECTOMY Right 10/16/2022  Procedure: LAPAROSCOPIC ASSISTED RIGHT HEMI COLECTOMY;  Surgeon: Andria Meuse, MD;  Location: [facility] OR;  Service: General;  Laterality: Right;  POLYPECTOMY    TUBAL LIGATION  01/15/1993  UTERINE SUSPENSION    mesh  VITRECTOMY Bilateral 03/03/2014  XI ROBOTIC ASSISTED HIATAL HERNIA REPAIR N/A 05/01/2021  Procedure: XI ROBOTIC ASSISTED TYPE III HIATAL HERNIA REPAIR WITH FUNDOPLICATION;  Surgeon: Luretha Murphy, MD;  Location: [facility];  Service: General;  Laterality: N/A; [name] 10/22/2022, 3:35 PM  DG CHEST PORT 1 VIEW  Result Date: 10/21/2022 CLINICAL DATA:  Shortness of breath EXAM: PORTABLE CHEST 1 VIEW COMPARISON:  10/16/2022 FINDINGS: The heart size and mediastinal contours are within normal limits. Unchanged small bilateral pleural effusions and associated atelectasis or consolidation. Non weighted enteric feeding tube with tip in the gastric fundus. The visualized skeletal structures are unremarkable. IMPRESSION: 1. Unchanged small bilateral pleural effusions and associated atelectasis or consolidation. 2. Non weighted enteric feeding tube with tip in the gastric fundus. Electronically Signed   By: Jearld Lesch M.D.   On: 10/21/2022 18:33    Anti-infectives: Anti-infectives (From admission, onward)    Start     Dose/Rate Route Frequency Ordered Stop   10/16/22 2200  cefoTEtan (CEFOTAN) 2 g in sodium chloride 0.9 % 100 mL IVPB  2 g 200 mL/hr over 30 Minutes Intravenous Every 12 hours 10/16/22 1524 10/17/22 1113   10/16/22 0930  cefoTEtan (CEFOTAN) 2 g in sodium chloride 0.9 % 100 mL IVPB        2  g 200 mL/hr over 30 Minutes Intravenous On call to O.R. 10/16/22 0844 10/17/22 0659      FINAL MICROSCOPIC DIAGNOSIS:   A. COLON, RIGHT, RESECTION:       Segment of colon with terminal ileum with submucosal congestion and  serosal mesothelial reaction.       Vermiform appendix without significant diagnostic alteration.       Four lymph nodes, negative for metastatic carcinoma (0/4).       Negative for dysplasia or malignancy.   B. ADDITIONAL ILEUM, RESECTION:       Segment of small intestine without significant diagnostic  alteration.      Focal mesenteric hemorrhage.       Negative for dysplasia or malignancy.    Assessment/Plan Cecal volvulus  POD#7 s/p Laparoscopic converted to open right hemicolectomy 10/1 Dr. Cliffton Asters - Path benign as above.  - D1 diet per SLP. Continue TF@ goal until taking in enough PO then ok to wean off - Mobilize, therapies - recommending CIR - remove staples between POD10-14   Acute stroke - Per Neuro. Started on Plavix 10/6. Hgb 11.6 from 12.9.   FEN: D1 diet per SLP, TF's at goal.  VTE: sq heparin. Plavix ID: Cefotetan 10/1>10/2. None currently. Aferbile. WBC wnl  Foley: Failed TOV, replaced 10/4. Good UOP.     LOS: 9 days    Juliet Rude, [facility] Surgery 10/23/2022, 8:36 AM Please see Amion for pager number during day hours 7:00am-4:30pm

## 2022-10-23 NOTE — Progress Notes (Signed)
Physical Therapy Treatment Patient Details Name: Joan Robinson MRN: [id number] DOB: 1960/11/01 Today's Date: 10/23/2022   History of Present Illness 62 yo admitted 9/28 with RUQ pain with SBO.  9/29 facial droop, head CT: left posteroinferior cerebellar artery territory infarction. 9/30 MRI: Large acute infarct affecting both the left PICA and left AICA. 10/1 Rt hemicolectomy. PMhx: HTN, HLD, peptic ulcer disease, diverticulitis, bipolar disorder, hypothyroidism, GERD, Hashimoto's disease.    PT Comments  Pt greeted resting in bed and agreeable to session despite reports of poor sleep and increased fatigue this date. Pt with improved bed mobility this session needing CGA for safety and no physical assist to come to sitting EOB and demonstrating improved sitting posture at midline this session. Pt continues to require mod A for transfers and max A for gait with RW support with hands on assist to LLE management. Pt needing cues throughout gait for neutral head/neck and forward gaze as pt extending neck and looking up at ceiling with posterior lean in addition to L lateral lean. Pt able to perform marching of LLE at EOB at end of session to reinforce R weight shift. Current plan remains appropriate to address deficits and maximize functional independence and decrease caregiver burden. Pt continues to benefit from skilled PT services to progress toward functional mobility goals.      If plan is discharge home, recommend the following: A lot of help with walking and/or transfers;A lot of help with bathing/dressing/bathroom;Assistance with cooking/housework;Direct supervision/assist for medications management;Assist for transportation;Supervision due to cognitive status;Help with stairs or ramp for entrance;Direct supervision/assist for financial management   Can travel by private vehicle        Equipment Recommendations  None recommended by PT    Recommendations for Other Services        Precautions / Restrictions Precautions Precautions: Fall;Other (comment) Precaution Comments: watch sats Restrictions Weight Bearing Restrictions: No     Mobility  Bed Mobility Overal bed mobility: Needs Assistance Bed Mobility: Supine to Sit, Sit to Supine     Supine to sit: Contact guard Sit to supine: Min assist   General bed mobility comments: CGA to come to sitting EOB with pt demonstrating improved posture, min A to retutrn BLEs to bed at end of session    Transfers Overall transfer level: Needs assistance Equipment used: Rolling walker (2 wheels) Transfers: Sit to/from Stand, Bed to chair/wheelchair/BSC Sit to Stand: Mod assist           General transfer comment: mod A to rise from EOB and standard chair, cues for hand placement and physical assist needed to rise    Ambulation/Gait Ambulation/Gait assistance: Max assist Gait Distance (Feet): 20 Feet (x2) Assistive device: Rolling walker (2 wheels) Gait Pattern/deviations: Step-to pattern, Trunk flexed, Ataxic, Narrow base of support, Decreased weight shift to right, Decreased stance time - right Gait velocity: decr     General Gait Details: pt with continued mod L lean needing asssit to correct, pt needing hands on asssit with LLE as pt contineus to adduct and cross midline with each step   Stairs             Wheelchair Mobility     Tilt Bed    Modified Rankin (Stroke Patients Only) Modified Rankin (Stroke Patients Only) Pre-Morbid Rankin Score: No symptoms Modified Rankin: Severe disability     Balance Overall balance assessment: Needs assistance Sitting-balance support: Feet unsupported, Bilateral upper extremity supported Sitting balance-Leahy Scale: Poor Sitting balance - Comments: UB support EOB needing  assist at times due to left lean and poor postural stability as she scootted to EOB Postural control: Left lateral lean Standing balance support: Bilateral upper extremity supported,  During functional activity, Reliant on assistive device for balance Standing balance-Leahy Scale: Poor Standing balance comment: UB support on Rollator with max +2 assist for balance                            Cognition Arousal: Alert, Lethargic Behavior During Therapy: Flat affect Overall Cognitive Status: Impaired/Different from baseline Area of Impairment: Attention, Following commands, Problem solving, Safety/judgement                 Orientation Level: Time, Disoriented to Current Attention Level: Sustained   Following Commands: Follows one step commands consistently, Follows one step commands with increased time Safety/Judgement: Decreased awareness of safety, Decreased awareness of deficits   Problem Solving: Difficulty sequencing, Requires tactile cues, Requires verbal cues General Comments: pt easily distractible, needing cues to remain on task, pt with intermittent lethargy throughout session, needing cues to keep eyes open        Exercises Other Exercises Other Exercises: standing marching of LLE to encourage weight shift to R in standing    General Comments        Pertinent Vitals/Pain Pain Assessment Pain Assessment: Faces Faces Pain Scale: Hurts little more Pain Location: headache Pain Descriptors / Indicators: Aching, Grimacing, Headache Pain Intervention(s): Monitored during session, Limited activity within patient's tolerance    Home Living                          Prior Function            PT Goals (current goals can now be found in the care plan section) Acute Rehab PT Goals Patient Stated Goal: return home, garden PT Goal Formulation: With family Time For Goal Achievement: 10/29/22 Progress towards PT goals: Progressing toward goals    Frequency    Min 1X/week      PT Plan      Co-evaluation              AM-PAC PT "6 Clicks" Mobility   Outcome Measure  Help needed turning from your back to your  side while in a flat bed without using bedrails?: A Little Help needed moving from lying on your back to sitting on the side of a flat bed without using bedrails?: A Little Help needed moving to and from a bed to a chair (including a wheelchair)?: A Lot Help needed standing up from a chair using your arms (e.g., wheelchair or bedside chair)?: Total Help needed to walk in hospital room?: Total Help needed climbing 3-5 steps with a railing? : Total 6 Click Score: 11    End of Session Equipment Utilized During Treatment: Oxygen;Gait belt Activity Tolerance: Patient limited by fatigue;Patient tolerated treatment well Patient left: with call bell/phone within reach;in bed;with family/visitor present Nurse Communication: Mobility status PT Visit Diagnosis: Other abnormalities of gait and mobility (R26.89);Unsteadiness on feet (R26.81);Other symptoms and signs involving the nervous system (R29.898)     Time: 1443-1510 PT Time Calculation (min) (ACUTE ONLY): 27 min  Charges:    $Gait Training: 8-22 mins $Therapeutic Activity: 8-22 mins PT General Charges $$ ACUTE PT VISIT: 1 Visit                     Clotine Heiner R. PTA Acute  Rehabilitation Services Office: [phone]   Catalina Antigua 10/23/2022, 4:10 PM

## 2022-10-23 NOTE — Progress Notes (Signed)
Inpatient Rehab Admissions Coordinator:   I have no beds available for this patient to admit to CIR today.  Will continue to follow for timing of potential admission pending bed availability.   Estill Dooms, PT, DPT Admissions Coordinator [phone] 10/23/22  9:49 AM

## 2022-10-23 NOTE — Progress Notes (Addendum)
Speech Language Pathology Treatment: Dysphagia  Patient Details Name: Joan Robinson MRN: [id number] DOB: 1960/02/10 Today's Date: 10/23/2022 Time: 0938-1000 SLP Time Calculation (min) (ACUTE ONLY): 22 min  Assessment / Plan / Recommendation Clinical Impression  Pt seen with daughter present in room. Provided education regarding results of MBS completed previous date. Pt with persistent collection of secretions within her oral cavity, requiring Min verbal cueing to initiate a swallow to clear. Observed pt with trials of honey thick liquids with purees. Pt independently recalled education provided re: closing lips around teaspoon. She required Min verbal cueing to initiate a lingual sweep and perform subswallows throughout all trials. Per RN, attempted to provide meds crushed in puree with noted difficulty, although pt reports a significant food aversion so question impact of taste of taking pills crushed. Discussed with RN and MD who agree meds should primarily continue to be provided via Cortrak for now. Recommend continuing current diet of Dys 1 texture solids with honey thick liquids. SLP will continue to f/u to target swallowing and speech goals.    HPI HPI: 62 yo admitted 9/28 with RUQ pain with SBO.  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 PMhx: HTN, HLD, peptic ulcer disease, diverticulitis, bipolar disorder, hypothyroidism, GERD, Hashimoto's disease. Hemicolectomy completed on 10/1.      SLP Plan  Continue with current plan of care      Recommendations for follow up therapy are one component of a multi-disciplinary discharge planning process, led by the attending physician.  Recommendations may be updated based on patient status, additional functional criteria and insurance authorization.    Recommendations  Diet recommendations: Dysphagia 1 (puree);Honey-thick liquid Liquids provided via:  Teaspoon Medication Administration: Via alternative means Supervision: Staff to assist with self feeding;Full supervision/cueing for compensatory strategies;Trained caregiver to feed patient Compensations: Minimize environmental distractions;Slow rate;Small sips/bites Postural Changes and/or Swallow Maneuvers: Seated upright 90 degrees                  Oral care QID;Staff/trained caregiver to provide oral care   Frequent or constant Supervision/Assistance Dysphagia, oropharyngeal phase (R13.12)     Continue with current plan of care     Gwynneth Aliment, M.A., CF-SLP Speech Language Pathology, Acute Rehabilitation Services  Secure Chat preferred [phone]   10/23/2022, 10:06 AM

## 2022-10-23 NOTE — Progress Notes (Signed)
IR procedure request - g tube placement  History of cecal volvulus s/p lap converted to open rigt hemicolectomy on 10.1.24. Coplicated by CVA pm 10.6.24 on plavix. The CT abd pelvis from 10.1.24 before Surgery. Case reviewed with IR Attending Dr. Carmon Ginsberg Mir who recommends a new CT and pelvis with contrast be ordered for further evaluation. This was communicated to the Team via EPIC chat.

## 2022-10-24 DIAGNOSIS — K5790 Diverticulosis of intestine, part unspecified, without perforation or abscess without bleeding: Secondary | ICD-10-CM | POA: Diagnosis not present

## 2022-10-24 DIAGNOSIS — K56609 Unspecified intestinal obstruction, unspecified as to partial versus complete obstruction: Secondary | ICD-10-CM | POA: Diagnosis not present

## 2022-10-24 DIAGNOSIS — Z8719 Personal history of other diseases of the digestive system: Secondary | ICD-10-CM

## 2022-10-24 DIAGNOSIS — I10 Essential (primary) hypertension: Secondary | ICD-10-CM | POA: Diagnosis not present

## 2022-10-24 DIAGNOSIS — D649 Anemia, unspecified: Secondary | ICD-10-CM | POA: Diagnosis not present

## 2022-10-24 DIAGNOSIS — E43 Unspecified severe protein-calorie malnutrition: Secondary | ICD-10-CM

## 2022-10-24 DIAGNOSIS — E782 Mixed hyperlipidemia: Secondary | ICD-10-CM

## 2022-10-24 LAB — CBC WITH DIFFERENTIAL/PLATELET
Abs Immature Granulocytes: 0.18 10*3/uL — ABNORMAL HIGH (ref 0.00–0.07)
Basophils Absolute: 0.1 10*3/uL (ref 0.0–0.1)
Basophils Relative: 1 %
Eosinophils Absolute: 0.3 10*3/uL (ref 0.0–0.5)
Eosinophils Relative: 4 %
HCT: 35.9 % — ABNORMAL LOW (ref 36.0–46.0)
Hemoglobin: 11.8 g/dL — ABNORMAL LOW (ref 12.0–15.0)
Immature Granulocytes: 2 %
Lymphocytes Relative: 17 %
Lymphs Abs: 1.5 10*3/uL (ref 0.7–4.0)
MCH: 29.7 pg (ref 26.0–34.0)
MCHC: 32.9 g/dL (ref 30.0–36.0)
MCV: 90.4 fL (ref 80.0–100.0)
Monocytes Absolute: 0.9 10*3/uL (ref 0.1–1.0)
Monocytes Relative: 10 %
Neutro Abs: 6 10*3/uL (ref 1.7–7.7)
Neutrophils Relative %: 66 %
Platelets: 263 10*3/uL (ref 150–400)
RBC: 3.97 MIL/uL (ref 3.87–5.11)
RDW: 12.8 % (ref 11.5–15.5)
WBC: 8.9 10*3/uL (ref 4.0–10.5)
nRBC: 0 % (ref 0.0–0.2)

## 2022-10-24 LAB — COMPREHENSIVE METABOLIC PANEL
ALT: 26 U/L (ref 0–44)
AST: 25 U/L (ref 15–41)
Albumin: 2.8 g/dL — ABNORMAL LOW (ref 3.5–5.0)
Alkaline Phosphatase: 84 U/L (ref 38–126)
Anion gap: 13 (ref 5–15)
BUN: 17 mg/dL (ref 8–23)
CO2: 21 mmol/L — ABNORMAL LOW (ref 22–32)
Calcium: 8.6 mg/dL — ABNORMAL LOW (ref 8.9–10.3)
Chloride: 105 mmol/L (ref 98–111)
Creatinine, Ser: 0.77 mg/dL (ref 0.44–1.00)
GFR, Estimated: >60 mL/min (ref >60)
Glucose, Bld: 134 mg/dL — ABNORMAL HIGH (ref 70–99)
Potassium: 4 mmol/L (ref 3.5–5.1)
Sodium: 139 mmol/L (ref 135–145)
Total Bilirubin: 0.3 mg/dL (ref 0.3–1.2)
Total Protein: 6.2 g/dL — ABNORMAL LOW (ref 6.5–8.1)

## 2022-10-24 LAB — GLUCOSE, CAPILLARY
Glucose-Capillary: 137 mg/dL — ABNORMAL HIGH (ref 70–99)
Glucose-Capillary: 129 mg/dL — ABNORMAL HIGH (ref 70–99)
Glucose-Capillary: 154 mg/dL — ABNORMAL HIGH (ref 70–99)
Glucose-Capillary: 161 mg/dL — ABNORMAL HIGH (ref 70–99)
Glucose-Capillary: 133 mg/dL — ABNORMAL HIGH (ref 70–99)

## 2022-10-24 LAB — PHOSPHORUS: Phosphorus: 4 mg/dL (ref 2.5–4.6)

## 2022-10-24 LAB — MAGNESIUM: Magnesium: 2 mg/dL (ref 1.7–2.4)

## 2022-10-24 NOTE — Plan of Care (Signed)
  Problem: Education: Goal: Knowledge of General Education information will improve Description: Including pain rating scale, medication(s)/side effects and non-pharmacologic comfort measures Outcome: Progressing   Problem: Health Behavior/Discharge Planning: Goal: Ability to manage health-related needs will improve Outcome: Progressing   Problem: Activity: Goal: Risk for activity intolerance will decrease Outcome: Progressing   

## 2022-10-24 NOTE — Plan of Care (Signed)
Problem: Education: Goal: Knowledge of General Education information will improve Description: Including pain rating scale, medication(s)/side effects and non-pharmacologic comfort measures Outcome: Progressing   Problem: Health Behavior/Discharge Planning: Goal: Ability to manage health-related needs will improve Outcome: Progressing   Problem: Clinical Measurements: Goal: Ability to maintain clinical measurements within normal limits will improve Outcome: Progressing Goal: Will remain free from infection Outcome: Progressing Goal: Diagnostic test results will improve Outcome: Progressing Goal: Respiratory complications will improve Outcome: Progressing Goal: Cardiovascular complication will be avoided Outcome: Progressing   Problem: Activity: Goal: Risk for activity intolerance will decrease Outcome: Progressing   Problem: Nutrition: Goal: Adequate nutrition will be maintained Outcome: Progressing   Problem: Coping: Goal: Level of anxiety will decrease Outcome: Progressing   Problem: Elimination: Goal: Will not experience complications related to bowel motility Outcome: Progressing Goal: Will not experience complications related to urinary retention Outcome: Progressing   Problem: Pain Managment: Goal: General experience of comfort will improve Outcome: Progressing   Problem: Safety: Goal: Ability to remain free from injury will improve Outcome: Progressing   Problem: Skin Integrity: Goal: Risk for impaired skin integrity will decrease Outcome: Progressing   Problem: Education: Goal: Knowledge of General Education information will improve Description: Including pain rating scale, medication(s)/side effects and non-pharmacologic comfort measures Outcome: Progressing   Problem: Health Behavior/Discharge Planning: Goal: Ability to manage health-related needs will improve Outcome: Progressing   Problem: Clinical Measurements: Goal: Ability to maintain  clinical measurements within normal limits will improve Outcome: Progressing Goal: Will remain free from infection Outcome: Progressing Goal: Diagnostic test results will improve Outcome: Progressing Goal: Respiratory complications will improve Outcome: Progressing Goal: Cardiovascular complication will be avoided Outcome: Progressing   Problem: Activity: Goal: Risk for activity intolerance will decrease Outcome: Progressing   Problem: Nutrition: Goal: Adequate nutrition will be maintained Outcome: Progressing   Problem: Coping: Goal: Level of anxiety will decrease Outcome: Progressing   Problem: Elimination: Goal: Will not experience complications related to bowel motility Outcome: Progressing Goal: Will not experience complications related to urinary retention Outcome: Progressing   Problem: Pain Managment: Goal: General experience of comfort will improve Outcome: Progressing   Problem: Safety: Goal: Ability to remain free from injury will improve Outcome: Progressing   Problem: Skin Integrity: Goal: Risk for impaired skin integrity will decrease Outcome: Progressing   Problem: Education: Goal: Knowledge of disease or condition will improve Outcome: Progressing Goal: Knowledge of secondary prevention will improve (MUST DOCUMENT ALL) Outcome: Progressing Goal: Knowledge of patient specific risk factors will improve Loraine Leriche N/A or DELETE if not current risk factor) Outcome: Progressing   Problem: Ischemic Stroke/TIA Tissue Perfusion: Goal: Complications of ischemic stroke/TIA will be minimized Outcome: Progressing   Problem: Coping: Goal: Will verbalize positive feelings about self Outcome: Progressing Goal: Will identify appropriate support needs Outcome: Progressing   Problem: Health Behavior/Discharge Planning: Goal: Ability to manage health-related needs will improve Outcome: Progressing Goal: Goals will be collaboratively established with  patient/family Outcome: Progressing   Problem: Self-Care: Goal: Ability to participate in self-care as condition permits will improve Outcome: Progressing Goal: Verbalization of feelings and concerns over difficulty with self-care will improve Outcome: Progressing Goal: Ability to communicate needs accurately will improve Outcome: Progressing   Problem: Nutrition: Goal: Risk of aspiration will decrease Outcome: Progressing Goal: Dietary intake will improve Outcome: Progressing   Problem: Education: Goal: Knowledge of disease or condition will improve Outcome: Progressing Goal: Knowledge of secondary prevention will improve (MUST DOCUMENT ALL) Outcome: Progressing Goal: Knowledge of patient specific risk factors will improve Loraine Leriche  N/A or DELETE if not current risk factor) Outcome: Progressing

## 2022-10-24 NOTE — Progress Notes (Signed)
Physical Therapy Treatment Patient Details Name: Joan Robinson MRN: [id number] DOB: 03/01/1960 Today's Date: 10/24/2022   History of Present Illness 62 yo admitted 9/28 with RUQ pain with SBO.  9/29 facial droop, head CT: left posteroinferior cerebellar artery territory infarction. 9/30 MRI: Large acute infarct affecting both the left PICA and left AICA. 10/1 Rt hemicolectomy. PMhx: HTN, HLD, peptic ulcer disease, diverticulitis, bipolar disorder, hypothyroidism, GERD, Hashimoto's disease.    PT Comments  Pt making steady progress. Focused session on standing balance and improving quality of gait. Used mirror for visual cues. Pt continues to be motivated to maximize independence. Patient will benefit from intensive inpatient follow up therapy, >3 hours/day     If plan is discharge home, recommend the following: A lot of help with walking and/or transfers;A lot of help with bathing/dressing/bathroom;Assistance with cooking/housework;Direct supervision/assist for medications management;Assist for transportation;Supervision due to cognitive status;Help with stairs or ramp for entrance;Direct supervision/assist for financial management   Can travel by private vehicle        Equipment Recommendations  Other (comment) (To be determined at next venue)    Recommendations for Other Services       Precautions / Restrictions Precautions Precautions: Fall;Other (comment) Precaution Comments: watch sats Restrictions Weight Bearing Restrictions: No     Mobility  Bed Mobility Overal bed mobility: Needs Assistance Bed Mobility: Sit to Sidelying, Sidelying to Sit   Sidelying to sit: Min assist, HOB elevated, Used rails     Sit to sidelying: Contact guard assist, Used rails General bed mobility comments: Assist to elevate trunk into sitting. Assist for balance when scooting hips to EOB.    Transfers Overall transfer level: Needs assistance Equipment used: Rolling walker (2  wheels) Transfers: Sit to/from Stand Sit to Stand: Min assist, From elevated surface           General transfer comment: Assist to power up and stabilize. Multimodal cues for hand placement and technique.    Ambulation/Gait Ambulation/Gait assistance: Mod assist Gait Distance (Feet): 6 Feet (3' forward and back and 6' forward and back) Assistive device: Rolling walker (2 wheels) Gait Pattern/deviations: Step-to pattern, Ataxic, Decreased weight shift to right, Decreased stance time - right Gait velocity: decr Gait velocity interpretation: <1.31 ft/sec, indicative of household ambulator   General Gait Details: Used mirror in front of pt to work on correcting lt lean. Assist for balance and support. Stepping forward pt able to keep feet apart without cross over. Stepping baclwards more difficulty with base of support narrowing.   Stairs             Wheelchair Mobility     Tilt Bed    Modified Rankin (Stroke Patients Only) Modified Rankin (Stroke Patients Only) Pre-Morbid Rankin Score: No symptoms Modified Rankin: Moderately severe disability     Balance Overall balance assessment: Needs assistance Sitting-balance support: No upper extremity supported, Feet supported Sitting balance-Leahy Scale: Fair Sitting balance - Comments: Static able to self correct Postural control: Left lateral lean Standing balance support: Bilateral upper extremity supported, During functional activity, Reliant on assistive device for balance Standing balance-Leahy Scale: Poor Standing balance comment: walker and min assist for static standing                            Cognition Arousal: Alert Behavior During Therapy: WFL for tasks assessed/performed Overall Cognitive Status: Impaired/Different from baseline Area of Impairment: Problem solving, Safety/judgement, Following commands, Attention  Current Attention Level: Selective   Following  Commands: Follows one step commands with increased time Safety/Judgement: Decreased awareness of safety, Decreased awareness of deficits   Problem Solving: Slow processing, Requires verbal cues          Exercises Other Exercises Other Exercises: sitting and reaching with each UE Other Exercises: Standing and reaching with RUE with LUE on the walker    General Comments        Pertinent Vitals/Pain Pain Assessment Pain Assessment: Faces Faces Pain Scale: No hurt    Home Living                          Prior Function            PT Goals (current goals can now be found in the care plan section) Progress towards PT goals: Progressing toward goals    Frequency    Min 1X/week      PT Plan      Co-evaluation              AM-PAC PT "6 Clicks" Mobility   Outcome Measure  Help needed turning from your back to your side while in a flat bed without using bedrails?: A Little Help needed moving from lying on your back to sitting on the side of a flat bed without using bedrails?: A Little Help needed moving to and from a bed to a chair (including a wheelchair)?: A Lot Help needed standing up from a chair using your arms (e.g., wheelchair or bedside chair)?: A Lot Help needed to walk in hospital room?: Total Help needed climbing 3-5 steps with a railing? : Total 6 Click Score: 12    End of Session Equipment Utilized During Treatment: Oxygen;Gait belt Activity Tolerance: Patient tolerated treatment well Patient left: in bed;with call bell/phone within reach;with bed alarm set;with family/visitor present   PT Visit Diagnosis: Other abnormalities of gait and mobility (R26.89);Unsteadiness on feet (R26.81);Other symptoms and signs involving the nervous system (R29.898)     Time: 4132-4401 PT Time Calculation (min) (ACUTE ONLY): 23 min  Charges:    $Therapeutic Activity: 8-22 mins PT General Charges $$ ACUTE PT VISIT: 1 Visit                      [facility] PT Acute Rehabilitation Services Office [phone]    Angelina Ok [facility] 10/24/2022, 5:43 PM

## 2022-10-24 NOTE — Progress Notes (Signed)
Occupational Therapy Treatment Patient Details Name: Joan Robinson MRN: [id number] DOB: 09-30-1960 Today's Date: 10/24/2022   History of present illness 62 yo admitted 9/28 with RUQ pain with SBO.  9/29 facial droop, head CT: left posteroinferior cerebellar artery territory infarction. 9/30 MRI: Large acute infarct affecting both the left PICA and left AICA. 10/1 Rt hemicolectomy. PMhx: HTN, HLD, peptic ulcer disease, diverticulitis, bipolar disorder, hypothyroidism, GERD, Hashimoto's disease.   OT comments  Pt remains highly motivated. Able to complete bed mobility with CGA, stand with min assist and RW and side step along EOB with mod assist. Removed L eye patch for tracking activities and ADLs at EOB. Continues to have some trunk weakness and tendency to lean L as she fatigues and c/o pain in L flank when leaning L. Patient will benefit from intensive inpatient follow up therapy, >3 hours/day.       If plan is discharge home, recommend the following:  Two people to help with walking and/or transfers;A lot of help with bathing/dressing/bathroom;Assistance with cooking/housework;Assistance with feeding;Direct supervision/assist for financial management;Direct supervision/assist for medications management;Assist for transportation;Help with stairs or ramp for entrance   Equipment Recommendations  Other (comment) (defer to next venue)    Recommendations for Other Services      Precautions / Restrictions Precautions Precautions: Fall;Other (comment) Precaution Comments: watch sats Restrictions Weight Bearing Restrictions: No       Mobility Bed Mobility Overal bed mobility: Needs Assistance Bed Mobility: Supine to Sit, Sit to Sidelying     Supine to sit: Contact guard   Sit to sidelying: Contact guard assist General bed mobility comments: use of rail, no physical assist    Transfers Overall transfer level: Needs assistance Equipment used: Rolling walker (2 wheels) Transfers:  Sit to/from Stand Sit to Stand: Min assist, From elevated surface           General transfer comment: multimodal cues for technique, assist to rise and steady, assist to advance walker     Balance Overall balance assessment: Needs assistance   Sitting balance-Leahy Scale: Fair Sitting balance - Comments: intermittently reaching for foot board and leaning L, aware ana able to correct without assist Postural control: Left lateral lean Standing balance support: Bilateral upper extremity supported, During functional activity, Reliant on assistive device for balance Standing balance-Leahy Scale: Poor Standing balance comment: B UE and external support                           ADL either performed or assessed with clinical judgement   ADL Overall ADL's : Needs assistance/impaired Eating/Feeding: Minimal assistance;Sitting Eating/Feeding Details (indicate cue type and reason): ice chips             Upper Body Dressing : Moderate assistance;Sitting                   Functional mobility during ADLs: Moderate assistance;Rolling walker (2 wheels)      Extremity/Trunk Assessment              Vision   Additional Comments: Pt with eye patch per neuro order, successfully keeps eye closed for pt. Removed during session for work on visual tracking toward L, upward and downward. Recommended sister remove patch for short periods of time to allow light and opportunity for pt to use her vision.   Perception Perception Preception Impairment Details: Inattention/Neglect Perception-Other Comments: improved attention to L hemispace   Praxis      Cognition Arousal:  Alert Behavior During Therapy: WFL for tasks assessed/performed Overall Cognitive Status: Impaired/Different from baseline Area of Impairment: Problem solving, Safety/judgement, Following commands, Attention                   Current Attention Level: Selective   Following Commands: Follows one  step commands with increased time Safety/Judgement: Decreased awareness of safety, Decreased awareness of deficits   Problem Solving: Slow processing, Requires verbal cues General Comments: able to attend to RN while working with OT at [facility] and return attention to OT        Exercises      Shoulder Instructions       General Comments      Pertinent Vitals/ Pain       Pain Assessment Pain Assessment: Faces Faces Pain Scale: Hurts little more Pain Location: L side with she leans L Pain Descriptors / Indicators: Discomfort Pain Intervention(s): Monitored during session, Repositioned  Home Living                                          Prior Functioning/Environment              Frequency  Min 1X/week        Progress Toward Goals  OT Goals(current goals can now be found in the care plan section)  Progress towards OT goals: Progressing toward goals  Acute Rehab OT Goals OT Goal Formulation: With patient Time For Goal Achievement: 10/29/22 Potential to Achieve Goals: Good  Plan      Co-evaluation                 AM-PAC OT "6 Clicks" Daily Activity     Outcome Measure   Help from another person eating meals?: A Little Help from another person taking care of personal grooming?: A Little Help from another person toileting, which includes using toliet, bedpan, or urinal?: A Lot Help from another person bathing (including washing, rinsing, drying)?: A Lot Help from another person to put on and taking off regular upper body clothing?: A Lot Help from another person to put on and taking off regular lower body clothing?: A Lot 6 Click Score: 14    End of Session Equipment Utilized During Treatment: Gait belt;Rolling walker (2 wheels);Oxygen  OT Visit Diagnosis: Unsteadiness on feet (R26.81);Other abnormalities of gait and mobility (R26.89);Muscle weakness (generalized) (M62.81);Low vision, both eyes (H54.2);Other symptoms and signs  involving cognitive function   Activity Tolerance Patient tolerated treatment well   Patient Left in bed;with call bell/phone within reach;with bed alarm set;with family/visitor present   Nurse Communication          Time: 1010-1045 OT Time Calculation (min): 35 min  Charges: OT General Charges $OT Visit: 1 Visit OT Treatments $Therapeutic Activity: 23-37 mins  Berna Spare, OTR/L Acute Rehabilitation Services Office: [phone]   Evern Bio 10/24/2022, 12:15 PM

## 2022-10-24 NOTE — Progress Notes (Signed)
Inpatient Rehab Admissions Coordinator:   I do not have a bed available for Ms. Birdwell to admit today, but hopeful for tomorrow.  We can accept her with her cortrak, which many outside facilities will not.  I will f/u tomorrow.   Estill Dooms, PT, DPT Admissions Coordinator [phone] 10/24/22  12:23 PM

## 2022-10-24 NOTE — Plan of Care (Signed)
  Problem: Education: Goal: Knowledge of General Education information will improve Description: Including pain rating scale, medication(s)/side effects and non-pharmacologic comfort measures Outcome: Progressing   Problem: Health Behavior/Discharge Planning: Goal: Ability to manage health-related needs will improve Outcome: Progressing   Problem: Activity: Goal: Risk for activity intolerance will decrease Outcome: Progressing   Problem: Ischemic Stroke/TIA Tissue Perfusion: Goal: Complications of ischemic stroke/TIA will be minimized Outcome: Progressing   Problem: Coping: Goal: Will verbalize positive feelings about self Outcome: Progressing

## 2022-10-24 NOTE — Progress Notes (Signed)
Progress Note   Patient: Joan Robinson Elizardo ZOX:[id number] DOB: 10-Aug-1960 DOA: 10/13/2022     10 DOS: the patient was seen and examined on 10/24/2022   Brief hospital course: HPI: Laquida Stroup Krygier is a 62 y.o. female with past medical history of esophageal strictures status post dilation, multiple hernias status post multiple surgeries, GERD/PUD, hypertension, hyperlipidemia, autoimmune urticaria, emphysema, sleep apnea who presented to the ED with right upper quadrant and right lower quadrant abdominal pain x 2 days associated nausea. She was found to have small bowel obstruction and was being managed inpatient at [facility]. Then, noted to have left facial droop, slurring of her speech, and CT head without contrast which was notable for a moderately large left PICA territory infarct. She was transferred to ICU at [facility] and managed for stroke. She's now s/p open right hemicolectomy on 10/1. Transferred to [facility] on 10/7. See previous notes for additional details.   Significant Events: Admitted 10/13/2022 for SBO   Significant Labs: Admitting lipase normal at 10  Significant Imaging Studies: Admitting CT abd shows Area of narrowing within the ascending colon distal to the cecum as well as an associated area of narrowing in the terminal ileum with focal dilatation of the cecum and proximal ascending colon as well as the small bowel proximal to the terminal ileum. These changes are suspicious for internal hernia causing the long segment of narrowing and subsequent small-bowel obstruction. These changes are new from the prior CT examination(06-04-2022). Diverticulosis without diverticulitis.   Assessment and Plan: Principal Problem:   SBO (small bowel obstruction) (HCC) Active Problems:   Hypothyroidism   Hyperlipidemia   Essential hypertension   GERD (gastroesophageal reflux disease)   History of esophageal stricture   Chronic anemia   Sleep apnea   Diverticulosis   History of emphysema  (HCC)   Protein-calorie malnutrition, severe   Acute Stroke Cerebellar Edema  MRI 9/30 with large acute infarct affecting both L PICA and L AICA vascular territories.  Posterio fossa mass effect with effacement of the 4th ventricle inferiorly.  No evidence of obstructive hydrocephalus.  Mild inferior displacement of the ceebellar tonsils.  No more than mild mass effect upon the medulla. Serial head CT 10/2 showed unchanged appearance of L cerebellar infarct  10/4 showed stable size of L anterior inferior cerebellar hypodense nonhemorrhagic infarct - local mass efffect with effacement of the sulci - some mass effect on the 4th ventricle without obstruction Echo with EF 60-65%, no RWMA ]LDL 54, A1c 5.6. S/p 3% saline Appreciate neurology assistance - etiology unclear, recommending plavix (aspirin allergy), crestor -> IR eval and management for PEG tube placement-> repeat CT  abd/pelvis done, pending result.   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 Incentive spirometry. Nursing supportive care. Fall, aspiration precautions. DVT prophylaxis   Code Status: Full Code  Subjective: Patient is seen and examined today morning. She is lying comfortably. Tolerating tube feeds. Asked about her CT imaging study she got yesterday. Sister at bedside.  Physical Exam: Vitals:   10/24/22 0746 10/24/22 1239 10/24/22  1627 10/24/22 1957  BP: 134/88 129/87 126/82 133/77  Pulse: 100 98  82  Resp: 17  17 18   Temp: 98 F (36.7 C)  97.9 F  (36.6 C) 98.1 F (36.7 C)  TempSrc:    Oral  SpO2: 91% 92% 90% 97%  Weight:      Height:        General - Elderly Caucasian female, no apparent no distress HEENT - PERRLA, EOMI, atraumatic head, non tender sinuses. Lung - Clear, diffuse rales, rhonchi, no wheezes. Heart - S1, S2 heard, no murmurs, rubs, trace pedal edema. Abdomen - Soft, non tender non distended, bowel sounds good Neuro - Alert, awake and oriented x 3, non focal exam. Skin - Warm and dry.  Data Reviewed:      Latest Ref Rng & Units 10/24/2022    4:52 AM 10/23/2022    5:42 AM 10/22/2022    9:14 AM  CBC  WBC 4.0 - 10.5 K/uL 8.9  7.6  5.7   Hemoglobin 12.0 - 15.0 g/dL 29.5  62.1  30.8   Hematocrit 36.0 - 46.0 % 35.9  35.5  38.4   Platelets 150 - 400 K/uL 263  207  193       Latest Ref Rng & Units 10/24/2022    4:52 AM 10/23/2022    5:42 AM 10/22/2022    9:14 AM  BMP  Glucose 70 - 99 mg/dL 657  846  962   BUN 8 - 23 mg/dL 17  11  12    Creatinine 0.44 - 1.00 mg/dL 9.52  8.41  3.24   Sodium 135 - 145 mmol/L 139  138  140   Potassium 3.5 - 5.1 mmol/L 4.0  3.6  3.5   Chloride 98 - 111 mmol/L 105  102  102   CO2 22 - 32 mmol/L 21  25  24    Calcium 8.9 - 10.3 mg/dL 8.6  8.6  8.7    CT ABDOMEN PELVIS W CONTRAST  Result Date: 10/24/2022 CLINICAL DATA:  Weight loss, unintended. Evaluate anatomy for percutaneous gastrostomy tube placement. History of cecal volvulus. EXAM: CT ABDOMEN AND PELVIS WITH CONTRAST TECHNIQUE: Multidetector CT imaging of the abdomen and pelvis was performed using the standard protocol following bolus administration of intravenous contrast. RADIATION DOSE REDUCTION: This exam was performed according to the departmental dose-optimization program which includes automated exposure control, adjustment of the mA and/or kV according to patient size and/or use of iterative reconstruction technique. CONTRAST:  60mL OMNIPAQUE IOHEXOL 350 MG/ML SOLN COMPARISON:  10/16/2022 FINDINGS: Lower chest: Again noted  are areas volume loss at both lung bases. Trace pleural fluid has resolved. Hepatobiliary: Minimal change in the 1.1 cm hypodensity in the right hepatic lobe. Decreased distention of the gallbladder. No significant biliary dilatation. Main portal venous system is patent. Pancreas: Unremarkable. No pancreatic ductal dilatation or surrounding inflammatory changes. Spleen: Normal in size without focal abnormality. Adrenals/Urinary Tract: Adrenal glands are within normal limits. Negative for hydronephrosis. No suspicious renal lesion. Bladder is decompressed with a Foley catheter. Stomach/Bowel: Status post right hemicolectomy. Small bowel is decompressed compared to the prior CT. There is oral contrast in the stomach, small bowel and large bowel. Nasogastric tube is coiled the gastric cardia region. Contrast and debris within the stomach. No bowel structures between the stomach and the anterior abdominal wall. Difficult to exclude mild wall thickening involving small bowel loops but no evidence for focal inflammation. Evidence for at least 2 duodenal diverticula. Vascular/Lymphatic: Aortic atherosclerosis. No enlarged  abdominal or pelvic lymph nodes. Reproductive: Status post hysterectomy. No adnexal masses. Other: Ascites is markedly decreased. There is a small amount of residual fluid situated between the liver and right kidney. No suspicious intra-abdominal fluid collections or abscesses. No evidence for free air. Postoperative changes along the anterior abdominal wall compatible with recent surgery. Irregular fluid collection in the subcutaneous tissues below the skin staples. This subcutaneous collection roughly measures 8.2 x 3.5 x 3.4 cm. Musculoskeletal: No acute bone abnormality. IMPRESSION: 1. Postoperative changes compatible with a right hemicolectomy. Small bowel distension has resolved. 2. No bowel structures situated between the stomach and the anterior abdominal wall. The anatomy is likely amenable for  percutaneous gastrostomy tube placement. 3. Postsurgical changes in the anterior abdominal wall including an irregular subcutaneous fluid collection. Electronically Signed   By: Richarda Overlie M.D.   On: 10/24/2022 17:52     Family Communication: Discussed with patient, sister they understand and agree. All questions answereed.  Disposition: Status is: Inpatient Remains inpatient appropriate because: need PEG tube, rehab placement.  Planned Discharge Destination: Rehab     Time spent: 41 minutes  Author: Marcelino Duster, MD 10/24/2022 8:27 PM Secure chat 7am to 7pm For on call review [email or web address].

## 2022-10-24 NOTE — Progress Notes (Signed)
STROKE TEAM PROGRESS NOTE   INTERVAL HISTORY  Husband at bedside. Appears to be doing well.  Wearing patch.  Sister in room with her. Discussed PEG placement. Both are in agreement. IR consulted yesterday, waiting on CT abd to be read.    Vitals:   10/23/22 2023 10/24/22 0459 10/24/22 0746 10/24/22 1239  BP: 129/87 (!) 136/96 134/88 129/87  Pulse: 98 95 100 98  Resp: 18 18 17    Temp: 97.6 F (36.4 C) 97.8 F (36.6 C) 98 F (36.7 C)   TempSrc: Oral Oral    SpO2: 95% 94% 91% 92%  Weight:      Height:       CBC:  Recent Labs  Lab 10/23/22 0542 10/24/22 0452  WBC 7.6 8.9  NEUTROABS 4.9 6.0  HGB 11.6* 11.8*  HCT 35.5* 35.9*  MCV 87.7 90.4  PLT 207 263   Basic Metabolic Panel:  Recent Labs  Lab 10/23/22 0542 10/24/22 0452  NA 138 139  K 3.6 4.0  CL 102 105  CO2 25 21*  GLUCOSE 116* 134*  BUN 11 17  CREATININE 0.61 0.77  CALCIUM 8.6* 8.6*  MG 2.0 2.0  PHOS 3.9 4.0   Lipid Panel:  No results for input(s): "CHOL", "TRIG", "HDL", "CHOLHDL", "VLDL", "LDLCALC" in the last 168 hours.  HgbA1c:  No results for input(s): "HGBA1C" in the last 168 hours.   IMAGING past 24 hours No results found.  PHYSICAL EXAM Physical Exam  Constitutional: Appears well-developed and well-nourished. Laying in bed. Psych: Affect appropriate to situation HENT: Normocephalic.  Core track tube in place.  Musculoskeletal-no joint tenderness, deformity or swelling Cardiovascular: Normal rate and regular rhythm.  Respiratory: Effort normal, non-labored GI: Soft.  No distension. There is no tenderness.  Skin: WDI   Neuro:   MS: alert, awake. Oriented to person, place time.  Follows all commands, no aphasia. Dysarthria present.  CN: PERRL, EOMI. Left upper and lower facial droop severe.  She is unable to close her left eye appears red and irritated.  Tongue midline. No visual field cut.  Motor: able to lift all extemities but drift in left arm/leg.  Ataxia: present in left arm/leg.   Sensation: intact.     ASSESSMENT/PLAN Joan Robinson is a 62 y.o. female with past medical history of esophageal strictures status post dilation, multiple hernias status post multiple surgeries, GERD/PUD, hypertension, hyperlipidemia, autoimmune urticaria, emphysema, sleep apnea who presents to the ED with right upper quadrant and right lower quadrant abdominal pain x 2 days associated nausea.  She was found to have small bowel obstruction and was being managed inpatient at [facility].  Then, noted to have left facial droop, slurring of her speech, and CT head without contrast which was notable for a moderately large left PICA territory infarct. CT angio head and neck with and without contrast was negative for any large vessel occlusion.  Specifically, no basilar occlusion or thrombosis noted.  CT perfusion with no core or mismatch noted.  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 Confluent cytotoxic edema but no hemorrhagic transformation, no interval extension, and no significant posterior fossa mass effect. CTA head & neck left AICA not visualized Ct perfusion: no core MRI Large acute infarct (affecting both the left PICA and left AICA vascular territories). Posterior fossa mass effect with effacement of the fourth ventricle inferiorly. No evidence of obstructive hydrocephalus at this  time.  CT repeat 10/2 showed unchanged appearance of left cerebellar infarct.  No acute hemorrhage. Repeat CT 10/4-stable 2D Echo EF 60-65% LDL 54 HgbA1c 5.6 VTE prophylaxis - heparin subcutaneous  No antithrombotic prior to admission, con' t Plavix. ASA allergy Therapy recommendations:  CIR Disposition:  pending  Cerebellar edema Mild mass effect to compress on the brainstem Risk of developing hydrocephalus hypertonic stopped. Need close clinical and imaging  monitoring CT repeat 10/2 stable infarct, no hemorrhage, no hydrocephalus CT repeat stable.  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 CT abd/pelvis progression of SBO Status post surgery treatment 10/1   Hypertension Home meds:  none Stable Long-term BP goal normotensive  Hyperlipidemia Home meds:  crestor 20mg   LDL 54, goal < 70 Con't  home crestor 20  Continue statin at discharge  FEN:  TF @ 50  Seen by speech today MBS completed advance to dysphagia 1 pured diet continue to monitor PEG placement pending.  Other Stroke Risk Factors Sleep apnea, not on CPAP at home  Other Active Problems Esophageal stricture s/p dilation  PUD multiple hernias status post multiple surgeries  GERD Autoimmune urticaria  Emphysema  Left eye irritation-recommend wearing eye patch at night and day. lubricating drops.   Hospital day # 10  IR consulted. CT abd done reading pending. Plan for PEG. Family and pt in agreement.   Pending CIR placement.   Neurology will sign off. Call with questions.   Andreika Vandagriff,MD   To contact Stroke Continuity provider, please refer to [email or web address]. After hours, contact General Neurology

## 2022-10-25 ENCOUNTER — Encounter (HOSPITAL_COMMUNITY): Payer: Self-pay | Admitting: Physical Medicine and Rehabilitation

## 2022-10-25 ENCOUNTER — Other Ambulatory Visit: Payer: Self-pay

## 2022-10-25 ENCOUNTER — Inpatient Hospital Stay (HOSPITAL_COMMUNITY)
Admission: AD | Admit: 2022-10-25 | Discharge: 2022-11-13 | DRG: 057 | Disposition: A | Discharge status: Home Health | Payer: BC Managed Care – PPO | Source: Intra-hospital | Attending: Physical Medicine and Rehabilitation | Admitting: Physical Medicine and Rehabilitation

## 2022-10-25 DIAGNOSIS — R110 Nausea: Secondary | ICD-10-CM | POA: Diagnosis not present

## 2022-10-25 DIAGNOSIS — I69312 Visuospatial deficit and spatial neglect following cerebral infarction: Secondary | ICD-10-CM

## 2022-10-25 DIAGNOSIS — R109 Unspecified abdominal pain: Secondary | ICD-10-CM | POA: Diagnosis not present

## 2022-10-25 DIAGNOSIS — Z79890 Hormone replacement therapy (postmenopausal): Secondary | ICD-10-CM

## 2022-10-25 DIAGNOSIS — Z23 Encounter for immunization: Secondary | ICD-10-CM | POA: Diagnosis not present

## 2022-10-25 DIAGNOSIS — I959 Hypotension, unspecified: Secondary | ICD-10-CM | POA: Diagnosis not present

## 2022-10-25 DIAGNOSIS — G8389 Other specified paralytic syndromes: Secondary | ICD-10-CM | POA: Diagnosis present

## 2022-10-25 DIAGNOSIS — Z888 Allergy status to other drugs, medicaments and biological substances status: Secondary | ICD-10-CM

## 2022-10-25 DIAGNOSIS — T8141XA Infection following a procedure, superficial incisional surgical site, initial encounter: Secondary | ICD-10-CM | POA: Diagnosis present

## 2022-10-25 DIAGNOSIS — I63542 Cerebral infarction due to unspecified occlusion or stenosis of left cerebellar artery: Secondary | ICD-10-CM | POA: Diagnosis not present

## 2022-10-25 DIAGNOSIS — I69392 Facial weakness following cerebral infarction: Secondary | ICD-10-CM

## 2022-10-25 DIAGNOSIS — I69391 Dysphagia following cerebral infarction: Secondary | ICD-10-CM | POA: Diagnosis not present

## 2022-10-25 DIAGNOSIS — Z823 Family history of stroke: Secondary | ICD-10-CM

## 2022-10-25 DIAGNOSIS — K5901 Slow transit constipation: Secondary | ICD-10-CM | POA: Diagnosis not present

## 2022-10-25 DIAGNOSIS — K1379 Other lesions of oral mucosa: Secondary | ICD-10-CM | POA: Diagnosis not present

## 2022-10-25 DIAGNOSIS — Z8711 Personal history of peptic ulcer disease: Secondary | ICD-10-CM

## 2022-10-25 DIAGNOSIS — G450 Vertebro-basilar artery syndrome: Secondary | ICD-10-CM | POA: Diagnosis present

## 2022-10-25 DIAGNOSIS — Z85828 Personal history of other malignant neoplasm of skin: Secondary | ICD-10-CM

## 2022-10-25 DIAGNOSIS — M545 Low back pain, unspecified: Secondary | ICD-10-CM | POA: Diagnosis present

## 2022-10-25 DIAGNOSIS — H8190 Unspecified disorder of vestibular function, unspecified ear: Secondary | ICD-10-CM | POA: Insufficient documentation

## 2022-10-25 DIAGNOSIS — Z886 Allergy status to analgesic agent status: Secondary | ICD-10-CM

## 2022-10-25 DIAGNOSIS — Z881 Allergy status to other antibiotic agents status: Secondary | ICD-10-CM

## 2022-10-25 DIAGNOSIS — K56609 Unspecified intestinal obstruction, unspecified as to partial versus complete obstruction: Secondary | ICD-10-CM | POA: Diagnosis not present

## 2022-10-25 DIAGNOSIS — G8929 Other chronic pain: Secondary | ICD-10-CM | POA: Diagnosis present

## 2022-10-25 DIAGNOSIS — Z83438 Family history of other disorder of lipoprotein metabolism and other lipidemia: Secondary | ICD-10-CM

## 2022-10-25 DIAGNOSIS — R5381 Other malaise: Secondary | ICD-10-CM | POA: Diagnosis present

## 2022-10-25 DIAGNOSIS — I639 Cerebral infarction, unspecified: Secondary | ICD-10-CM | POA: Insufficient documentation

## 2022-10-25 DIAGNOSIS — F419 Anxiety disorder, unspecified: Secondary | ICD-10-CM | POA: Diagnosis present

## 2022-10-25 DIAGNOSIS — H409 Unspecified glaucoma: Secondary | ICD-10-CM | POA: Diagnosis present

## 2022-10-25 DIAGNOSIS — E440 Moderate protein-calorie malnutrition: Secondary | ICD-10-CM | POA: Diagnosis present

## 2022-10-25 DIAGNOSIS — Z882 Allergy status to sulfonamides status: Secondary | ICD-10-CM

## 2022-10-25 DIAGNOSIS — R339 Retention of urine, unspecified: Secondary | ICD-10-CM | POA: Diagnosis present

## 2022-10-25 DIAGNOSIS — H9192 Unspecified hearing loss, left ear: Secondary | ICD-10-CM | POA: Diagnosis present

## 2022-10-25 DIAGNOSIS — G4700 Insomnia, unspecified: Secondary | ICD-10-CM | POA: Diagnosis present

## 2022-10-25 DIAGNOSIS — F319 Bipolar disorder, unspecified: Secondary | ICD-10-CM | POA: Diagnosis present

## 2022-10-25 DIAGNOSIS — Z9049 Acquired absence of other specified parts of digestive tract: Secondary | ICD-10-CM

## 2022-10-25 DIAGNOSIS — Z87891 Personal history of nicotine dependence: Secondary | ICD-10-CM

## 2022-10-25 DIAGNOSIS — R1310 Dysphagia, unspecified: Secondary | ICD-10-CM

## 2022-10-25 DIAGNOSIS — I63532 Cerebral infarction due to unspecified occlusion or stenosis of left posterior cerebral artery: Secondary | ICD-10-CM | POA: Diagnosis not present

## 2022-10-25 DIAGNOSIS — F909 Attention-deficit hyperactivity disorder, unspecified type: Secondary | ICD-10-CM | POA: Diagnosis present

## 2022-10-25 DIAGNOSIS — G4730 Sleep apnea, unspecified: Secondary | ICD-10-CM | POA: Diagnosis present

## 2022-10-25 DIAGNOSIS — I69311 Memory deficit following cerebral infarction: Secondary | ICD-10-CM

## 2022-10-25 DIAGNOSIS — R42 Dizziness and giddiness: Secondary | ICD-10-CM | POA: Diagnosis not present

## 2022-10-25 DIAGNOSIS — Z8261 Family history of arthritis: Secondary | ICD-10-CM

## 2022-10-25 DIAGNOSIS — D72829 Elevated white blood cell count, unspecified: Secondary | ICD-10-CM | POA: Diagnosis not present

## 2022-10-25 DIAGNOSIS — L988 Other specified disorders of the skin and subcutaneous tissue: Secondary | ICD-10-CM | POA: Diagnosis not present

## 2022-10-25 DIAGNOSIS — D649 Anemia, unspecified: Secondary | ICD-10-CM | POA: Diagnosis not present

## 2022-10-25 DIAGNOSIS — Z8719 Personal history of other diseases of the digestive system: Secondary | ICD-10-CM

## 2022-10-25 DIAGNOSIS — Z79899 Other long term (current) drug therapy: Secondary | ICD-10-CM

## 2022-10-25 DIAGNOSIS — Z800 Family history of malignant neoplasm of digestive organs: Secondary | ICD-10-CM

## 2022-10-25 DIAGNOSIS — I69398 Other sequelae of cerebral infarction: Secondary | ICD-10-CM

## 2022-10-25 DIAGNOSIS — K562 Volvulus: Secondary | ICD-10-CM | POA: Diagnosis not present

## 2022-10-25 DIAGNOSIS — I69393 Ataxia following cerebral infarction: Principal | ICD-10-CM

## 2022-10-25 DIAGNOSIS — N390 Urinary tract infection, site not specified: Secondary | ICD-10-CM | POA: Diagnosis not present

## 2022-10-25 DIAGNOSIS — H538 Other visual disturbances: Secondary | ICD-10-CM | POA: Diagnosis present

## 2022-10-25 DIAGNOSIS — Z6824 Body mass index (BMI) 24.0-24.9, adult: Secondary | ICD-10-CM

## 2022-10-25 DIAGNOSIS — K5900 Constipation, unspecified: Secondary | ICD-10-CM | POA: Diagnosis not present

## 2022-10-25 DIAGNOSIS — K219 Gastro-esophageal reflux disease without esophagitis: Secondary | ICD-10-CM | POA: Diagnosis present

## 2022-10-25 DIAGNOSIS — E785 Hyperlipidemia, unspecified: Secondary | ICD-10-CM | POA: Diagnosis present

## 2022-10-25 DIAGNOSIS — Z8249 Family history of ischemic heart disease and other diseases of the circulatory system: Secondary | ICD-10-CM

## 2022-10-25 DIAGNOSIS — I10 Essential (primary) hypertension: Secondary | ICD-10-CM | POA: Diagnosis present

## 2022-10-25 DIAGNOSIS — Z90710 Acquired absence of both cervix and uterus: Secondary | ICD-10-CM

## 2022-10-25 DIAGNOSIS — Z83719 Family history of colon polyps, unspecified: Secondary | ICD-10-CM

## 2022-10-25 DIAGNOSIS — Z7902 Long term (current) use of antithrombotics/antiplatelets: Secondary | ICD-10-CM

## 2022-10-25 DIAGNOSIS — J439 Emphysema, unspecified: Secondary | ICD-10-CM | POA: Diagnosis present

## 2022-10-25 DIAGNOSIS — K136 Irritative hyperplasia of oral mucosa: Secondary | ICD-10-CM | POA: Diagnosis not present

## 2022-10-25 DIAGNOSIS — F411 Generalized anxiety disorder: Secondary | ICD-10-CM | POA: Diagnosis not present

## 2022-10-25 DIAGNOSIS — E039 Hypothyroidism, unspecified: Secondary | ICD-10-CM | POA: Diagnosis not present

## 2022-10-25 LAB — GLUCOSE, CAPILLARY
Glucose-Capillary: 144 mg/dL — ABNORMAL HIGH (ref 70–99)
Glucose-Capillary: 124 mg/dL — ABNORMAL HIGH (ref 70–99)
Glucose-Capillary: 101 mg/dL — ABNORMAL HIGH (ref 70–99)
Glucose-Capillary: 114 mg/dL — ABNORMAL HIGH (ref 70–99)
Glucose-Capillary: 142 mg/dL — ABNORMAL HIGH (ref 70–99)

## 2022-10-25 MED ORDER — SALINE SPRAY 0.65 % NA SOLN: 1.0000 | NASAL | Status: DC | PRN

## 2022-10-25 MED ORDER — LEVOTHYROXINE SODIUM 88 MCG PO TABS
88.0000 ug | ORAL_TABLET | Freq: Every day | ORAL | Status: DC
  Administered 2022-10-26 – 2022-11-13 (×19): 88 ug via ORAL
  Filled 2022-10-25 (×19): qty 1

## 2022-10-25 MED ORDER — HYDROCODONE-ACETAMINOPHEN 5-325 MG PO TABS
1.0000 | ORAL_TABLET | Freq: Every day | ORAL | Status: DC
  Administered 2022-10-25 – 2022-11-12 (×19): 1 via ORAL
  Filled 2022-10-25 (×19): qty 1

## 2022-10-25 MED ORDER — CALCIUM POLYCARBOPHIL 625 MG PO TABS
625.0000 mg | ORAL_TABLET | Freq: Every day | ORAL | Status: DC
  Administered 2022-10-26 – 2022-11-13 (×19): 625 mg via ORAL
  Filled 2022-10-25 (×19): qty 1

## 2022-10-25 MED ORDER — CALCIUM POLYCARBOPHIL 625 MG PO TABS: 625.0000 mg | ORAL_TABLET | Freq: Every day | ORAL | Status: DC

## 2022-10-25 MED ORDER — TRAZODONE HCL 50 MG PO TABS: 50.0000 mg | ORAL_TABLET | Freq: Every day | ORAL | Status: DC

## 2022-10-25 MED ORDER — METHOCARBAMOL 500 MG PO TABS: 500.0000 mg | ORAL_TABLET | Freq: Three times a day (TID) | ORAL | Status: DC | PRN

## 2022-10-25 MED ORDER — HEPARIN SODIUM (PORCINE) 5000 UNIT/ML IJ SOLN
5000.0000 [IU] | Freq: Three times a day (TID) | INTRAMUSCULAR | Status: DC
  Administered 2022-10-25 – 2022-10-26 (×3): 5000 [IU] via SUBCUTANEOUS
  Filled 2022-10-25 (×3): qty 1

## 2022-10-25 MED ORDER — ONDANSETRON HCL 4 MG/2ML IJ SOLN
4.0000 mg | Freq: Four times a day (QID) | INTRAMUSCULAR | Status: DC | PRN
  Administered 2022-10-29 – 2022-11-05 (×3): 4 mg via INTRAVENOUS
  Filled 2022-10-25 (×3): qty 2

## 2022-10-25 MED ORDER — ARTIFICIAL TEARS OPHTHALMIC OINT
TOPICAL_OINTMENT | Freq: Every day | OPHTHALMIC | Status: DC
  Filled 2022-10-25: qty 3.5

## 2022-10-25 MED ORDER — ARTIFICIAL TEARS OPHTHALMIC OINT: TOPICAL_OINTMENT | Freq: Every day | OPHTHALMIC | Status: DC

## 2022-10-25 MED ORDER — OSMOLITE 1.5 CAL PO LIQD
1000.0000 mL | ORAL | Status: DC
  Administered 2022-10-25: 1000 mL

## 2022-10-25 MED ORDER — ACETAMINOPHEN 325 MG PO TABS
325.0000 mg | ORAL_TABLET | ORAL | Status: DC | PRN
  Administered 2022-10-27 – 2022-11-12 (×5): 650 mg via ORAL
  Filled 2022-10-25 (×5): qty 2

## 2022-10-25 MED ORDER — METHOCARBAMOL 500 MG PO TABS
500.0000 mg | ORAL_TABLET | Freq: Four times a day (QID) | ORAL | Status: DC
  Administered 2022-10-25 – 2022-11-04 (×36): 500 mg via ORAL
  Filled 2022-10-25 (×38): qty 1

## 2022-10-25 MED ORDER — CHLORHEXIDINE GLUCONATE CLOTH 2 % EX PADS
6.0000 | MEDICATED_PAD | Freq: Two times a day (BID) | CUTANEOUS | Status: DC
  Administered 2022-10-26: 6 via TOPICAL

## 2022-10-25 MED ORDER — OSMOLITE 1.5 CAL PO LIQD: 1000.0000 mL | ORAL | Status: DC

## 2022-10-25 MED ORDER — ROSUVASTATIN CALCIUM 20 MG PO TABS
20.0000 mg | ORAL_TABLET | Freq: Every day | ORAL | Status: DC
  Administered 2022-10-26 – 2022-11-13 (×19): 20 mg via ORAL
  Filled 2022-10-25 (×19): qty 1

## 2022-10-25 MED ORDER — GERHARDT'S BUTT CREAM
TOPICAL_CREAM | CUTANEOUS | Status: DC | PRN
  Filled 2022-10-25: qty 1

## 2022-10-25 MED ORDER — MELATONIN 5 MG PO TABS
10.0000 mg | ORAL_TABLET | Freq: Every day | ORAL | Status: DC
  Administered 2022-10-25: 10 mg via ORAL
  Filled 2022-10-25: qty 2

## 2022-10-25 MED ORDER — LATANOPROST 0.005 % OP SOLN
1.0000 [drp] | Freq: Every day | OPHTHALMIC | Status: DC
  Administered 2022-10-25 – 2022-11-12 (×19): 1 [drp] via OPHTHALMIC
  Filled 2022-10-25: qty 2.5

## 2022-10-25 MED ORDER — PANTOPRAZOLE SODIUM 40 MG PO TBEC
40.0000 mg | DELAYED_RELEASE_TABLET | Freq: Every day | ORAL | Status: DC
  Administered 2022-10-26 – 2022-11-13 (×17): 40 mg via ORAL
  Filled 2022-10-25 (×19): qty 1

## 2022-10-25 MED ORDER — TRAZODONE HCL 50 MG PO TABS: 50.0000 mg | ORAL_TABLET | ORAL | Status: DC

## 2022-10-25 MED ORDER — CLOPIDOGREL BISULFATE 75 MG PO TABS: 75.0000 mg | ORAL_TABLET | Freq: Every day | ORAL | Status: DC

## 2022-10-25 MED ORDER — ONDANSETRON HCL 4 MG PO TABS
4.0000 mg | ORAL_TABLET | Freq: Four times a day (QID) | ORAL | Status: DC | PRN
  Administered 2022-10-27 – 2022-10-28 (×3): 4 mg via ORAL
  Filled 2022-10-25 (×3): qty 1

## 2022-10-25 MED ORDER — FOOD THICKENER (SIMPLYTHICK HONEY): 10.0000 | ORAL | Status: DC | PRN

## 2022-10-25 MED ORDER — CLOPIDOGREL BISULFATE 75 MG PO TABS
75.0000 mg | ORAL_TABLET | Freq: Every day | ORAL | Status: DC
  Administered 2022-10-26 – 2022-11-13 (×19): 75 mg via ORAL
  Filled 2022-10-25 (×19): qty 1

## 2022-10-25 MED ORDER — TRAZODONE HCL 50 MG PO TABS
50.0000 mg | ORAL_TABLET | ORAL | Status: DC
  Administered 2022-10-25: 50 mg via ORAL
  Filled 2022-10-25: qty 1

## 2022-10-25 MED ORDER — HEPARIN SODIUM (PORCINE) 5000 UNIT/ML IJ SOLN: 5000.0000 [IU] | Freq: Three times a day (TID) | INTRAMUSCULAR | Status: DC

## 2022-10-25 MED ORDER — POLYVINYL ALCOHOL 1.4 % OP SOLN
2.0000 [drp] | Freq: Four times a day (QID) | OPHTHALMIC | Status: DC
  Administered 2022-10-25 – 2022-11-13 (×71): 2 [drp] via OPHTHALMIC

## 2022-10-25 NOTE — Progress Notes (Addendum)
PMR Admission Coordinator Pre-Admission Assessment   Patient: Joan Robinson is an 62 y.o., female MRN: [id number] DOB: 1960-06-14 Height: 5\' 6"  (167.6 cm) Weight: 70.9 kg   Insurance Information HMO:     PPO: yes     PCP:      IPA:      80/20:      OTHER:  PRIMARY: BCBS COMM      Policy#: [id number]      Subscriber: patient CM Name: Joan Robinson      Phone#: [phone]     Fax#: [phone] Pre-Cert#: [id number] auth for CIR from [place] with [employer] with updates due to her at fax listed above on 10/23.       Employer:  Benefits:  Phone #: [phone]     Name:  Eff. Date: 5/1/124     Deduct: $1000 (met $423.60)      Out of Pocket Max: $2500 ($1352.04 met)      Life Max: n/a CIR: 80%      SNF: 80% Outpatient:      Co-Pay: $50/visit Home Health: 80%      Co-Pay: 20% DME: 80%     Co-Pay: 20% Providers: in-network SECONDARY:       Policy#:      Phone#:    Joan Robinson:       Phone#:    The Data processing manager" for patients in Inpatient Rehabilitation Facilities with attached "Privacy Act Statement-Health Care Records" was provided and verbally reviewed with: N/A   Emergency Contact Information Contact Information       Name Relation Home Work Mobile    Joan Robinson,Joan Robinson [phone]   [phone]    [facility] Daughter     [phone]    Joan Robinson     [phone]         Other Contacts   None on File        Current Medical History  Patient Admitting Diagnosis: CVA/debility    History of Present Illness: Pt is a 62 year old female  with medical hx significant for: diverticulitis (May 2024), gallstones, hiatal hernia repair (2023), HTN, hyperlipidemia, peptic ulcer disease, bipolar disorder, hypothyroidism, GERD, Hashimoto's disease. Pt presented to the ED at [facility] on 10/13/22 d/t acute onset of right-sided abdominal pain, right upper quadrant, right lower quadrant associated with intermittent nausea and vomiting for 2  days prior. CT abdomen/pelvis showed area of narrowing within ascending colon distal to cecum as well as associated area of narrowing in terminal ileum with focal dilatation of cecum and proximal ascending colon as well as small bowel proximal to terminal ileum. Changes are suspicious for internal hernial causing long segment of narrowing and subsequent small-bowel obstruction. Diverticulosis without diverticulitis. NG tube placed. Pt transferred to [facility] on 9/28. Code stroke activated on 10/14/22 after pt noted to have slurred speech and left side facial droop. CT revealed acute left cerebellar infarct as well as possible involvement in left medulla suspicious for possible acute posterior inferior cerebellar infarct. CTA negative for LVO. Pt transferred to [facility] for further workup on 10/14/22.  MRI showed large acute infarct affecting both the left PICA and left AICA vascular territories. Repeat CT abdomen/pelvis showed progression of bowel obstruction/cecal volvulus with prominent distension of cecum and focal area of twisting at cecal neck corresponding to zone of obstruction.  Pt underwent laparoscopic assisted right hemicolectomy by Dr. Cliffton Asters on 10/16/22.  Therapy evaluations completed and CIR recommended d/t pt's deficits in functional mobility.  Complete NIHSS TOTAL: 6   Patient's medical record from [facility] has been reviewed by the rehabilitation admission coordinator and physician.   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   BCC (basal cell carcinoma of skin) 06/01/2012    Leg Follows with Dr Margo Aye   Bipolar disorder (HCC) 01/17/2007    Qualifier: Diagnosis of  By:  Everardo All MD, Sean A    Cataract     Diverticulosis     Diverticulosis     Emphysema of lung (HCC)     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 Yes   Family History   family history includes Aneurysm in her father; Arthritis in her sister; Colon cancer in her paternal uncle; Colon polyps in her father; Heart disease (age of onset: 2) in her mother; Hyperlipidemia in her mother; Hypertension in her mother and sister; Irritable bowel syndrome in her daughter; Mental illness in her daughter and son; Other in her daughter, father, mother, sister, and sister; Stroke in her mother.   Current Medications  Current Medications    Current Facility-Administered Medications:    acetaminophen (TYLENOL) 160 MG/5ML solution 500 mg, 500 mg, Oral, Q6H, Zigmund Daniel., MD, 500 mg at 10/25/22 0847   acetaminophen (TYLENOL) tablet 650 mg, 650 mg, Oral, Q6H PRN **OR** acetaminophen (TYLENOL) suppository 650 mg, 650 mg, Rectal, Q6H PRN, Meuth, Brooke A, PA-C, 650 mg at 10/15/22 1915   albuterol (PROVENTIL) (2.5 MG/3ML) 0.083% nebulizer solution 2.5 mg, 2.5 mg, Nebulization, Q6H PRN, Meuth, Brooke A, PA-C, 2.5 mg at 10/23/22 1242   artificial tears (LACRILUBE) ophthalmic ointment, , Both Eyes, QHS, Zigmund Daniel., MD, Given at 10/24/22 2037   Chlorhexidine Gluconate Cloth 2 % PADS 6 each, 6 each, Topical, Daily, Meuth, Brooke A, PA-C, 6 each at 10/25/22 0850   clopidogrel (PLAVIX) tablet 75 mg, 75 mg, Oral, Daily, Zigmund Daniel., MD, 75 mg  at 10/25/22 0846   dextrose 50 % solution 50 mL, 1 ampule, Intravenous, PRN, Meuth, Brooke A, PA-C   feeding supplement (OSMOLITE 1.5 CAL) liquid 1,000 mL, 1,000 mL, Per Tube, Continuous, Palikh, Jeri Lager, MD, Last Rate: 50 mL/hr at 10/24/22 1559, 1,000 mL at 10/24/22 1559   food thickener (SIMPLYTHICK (HONEY/LEVEL 3/MODERATELY THICK)) 10 packet, 10 packet, Oral, PRN, Zigmund Daniel., MD   Gerhardt's butt cream, , Topical, PRN, Harolyn Rutherford, MD, Given at 10/21/22 1744   heparin injection 5,000 Units, 5,000 Units, Subcutaneous, Q8H, Meuth, Brooke A, PA-C, 5,000 Units at 10/25/22 0651   HYDROcodone-acetaminophen (NORCO/VICODIN) 5-325  MG per tablet 1 tablet, 1 tablet, Oral, QHS, Zigmund Daniel., MD, 1 tablet at 10/24/22 2215   HYDROmorphone (DILAUDID) injection 1 mg, 1 mg, Intravenous, Q3H PRN, Meuth, Brooke A, PA-C, 1 mg at 10/18/22 0345   latanoprost (XALATAN) 0.005 % ophthalmic solution 1 drop, 1 drop, Both Eyes, QHS, Meuth, Brooke A, PA-C, 1 drop at 10/24/22 2037   levothyroxine (SYNTHROID) tablet 88 mcg, 88 mcg, Oral, Q0600, Zigmund Daniel., MD, 88 mcg at 10/25/22 0605   lip balm (CARMEX) ointment, , Topical, PRN, Meuth, Brooke A, PA-C, Given at 10/15/22 2237   melatonin tablet 10 mg, 10 mg, Oral, QHS, Zigmund Daniel., MD, 10 mg at 10/25/22 0146   methocarbamol (ROBAXIN) tablet 500 mg, 500 mg, Oral, QID, Zigmund Daniel., MD, 500 mg at 10/25/22 0847   ondansetron (ZOFRAN) tablet 4 mg, 4 mg, Oral, Q6H PRN **OR** ondansetron (ZOFRAN) injection 4 mg, 4 mg, Intravenous, Q6H PRN, Zigmund Daniel., MD   Oral care mouth rinse, 15 mL, Mouth Rinse, 4 times per day, Meuth, Brooke A, PA-C, 15 mL at 10/25/22 0851   Oral care mouth rinse, 15 mL, Mouth Rinse, PRN, Meuth, Brooke A, PA-C   pantoprazole (PROTONIX) EC tablet 40 mg, 40 mg, Oral, Daily, Pham, Minh Q, RPH-CPP, 40 mg at 10/25/22 0902   polycarbophil (FIBERCON) tablet 625 mg, 625 mg, Oral, Daily, Zigmund Daniel., MD, 625 mg at 10/25/22 6269   polyvinyl alcohol (LIQUIFILM TEARS) 1.4 % ophthalmic solution 2 drop, 2 drop, Both Eyes, QID, Zigmund Daniel., MD, 2 drop at 10/25/22 4854   prochlorperazine (COMPAZINE) injection 5 mg, 5 mg, Intravenous, Q6H PRN, Meuth, Brooke A, PA-C   rosuvastatin (CRESTOR) tablet 20 mg, 20 mg, Oral, Daily, Zigmund Daniel., MD, 20 mg at 10/25/22 0846   sodium chloride (OCEAN) 0.65 % nasal spray 1 spray, 1 spray, Each Nare, PRN, Milon Dikes, MD   traZODone (DESYREL) tablet 50 mg, 50 mg, Oral, QHS, Marcelino Duster, MD     Patients Current Diet:  Diet Order                  DIET - DYS 1 Room service appropriate? Yes with Assist; Fluid consistency: Honey Thick  Diet effective now                         Precautions / Restrictions Precautions Precautions: Fall, Other (comment) Precaution Comments: watch sats Restrictions Weight Bearing Restrictions: No    Has the patient had 2 or more falls or a fall with injury in the past year? No   Prior Activity Level Community (5-7x/wk): works, drives, gets out of house ~3 days/week   Prior Functional Level Self Care: Did the patient need help bathing, dressing, using the toilet or eating? Independent   Indoor Mobility: Did the patient need assistance with walking from room to room (with or without device)? Independent   Stairs: Did the patient need assistance with internal or external stairs (with or without device)? Independent   Functional Cognition: Did the patient need help planning regular tasks such as shopping or remembering to take medications? Independent   Patient Information Are you of Hispanic, Latino/a,or Spanish origin?: A. No, not of Hispanic, Latino/a, or Spanish origin What is your race?: A. White Do you need or want an interpreter to communicate with a doctor or health care staff?: 0. No   Patient's Response To:  Health Literacy and Transportation Is the patient  able to respond to health literacy and transportation needs?: Yes Health Literacy - How often do you need to have someone help you when you read instructions, pamphlets, or other written material from your doctor or pharmacy?: Never In the past 12 months, has lack of transportation kept you from medical appointments or from getting medications?: No In the past 12 months, has lack of transportation kept you from meetings, work, or from getting things needed for daily living?: No   Home Assistive Devices / Equipment Home Equipment: Agricultural consultant (2 wheels), BSC/3in1, Information systems manager, Medical laboratory scientific officer - single point   Prior Device Use: Indicate devices/aids used by the patient prior to current illness, exacerbation or injury? None of the above   Current Functional Level Cognition   Arousal/Alertness: Awake/alert Overall Cognitive Status: Impaired/Different from baseline Current Attention Level: Selective Orientation Level: Oriented X4 Following Commands: Follows one step commands with increased time Safety/Judgement: Decreased awareness of safety, Decreased awareness of deficits General Comments: able to attend to RN while working with OT at [facility] and return attention to OT Attention: Sustained, Focused Focused Attention: Appears intact Sustained Attention: Impaired Sustained Attention Impairment: Verbal basic Problem Solving: Appears intact (for basic questions)    Extremity Assessment (includes Sensation/Coordination)   Upper Extremity Assessment: LUE deficits/detail LUE Deficits / Details: 3/5, inattention to the L. poor functional use, needs maximal cues. Able to sustain a grasp on RW LUE Sensation: decreased proprioception, decreased light touch LUE Coordination: decreased fine motor, decreased gross motor  Lower Extremity Assessment: Defer to PT evaluation     ADLs   Overall ADL's : Needs assistance/impaired Eating/Feeding: Minimal assistance, Sitting Eating/Feeding Details (indicate cue type  and reason): ice chips Grooming: Minimal assistance, Sitting Upper Body Bathing: Moderate assistance, Sitting Lower Body Bathing: Maximal assistance, Sit to/from stand Upper Body Dressing : Moderate assistance, Sitting Lower Body Dressing: Maximal assistance, +2 for physical assistance, +2 for safety/equipment, Sit to/from stand Lower Body Dressing Details (indicate cue type and reason): maximal cues for attention and physical assist Toilet Transfer: Moderate assistance, +2 for physical assistance, +2 for safety/equipment, Ambulation, Regular Toilet, Rolling walker (2 wheels) Toileting- Clothing Manipulation and Hygiene: Total assistance, +2 for physical assistance, +2 for safety/equipment, Sit to/from stand Functional mobility during ADLs: Moderate assistance, Rolling walker (2 wheels) General ADL Comments: continues to be limited by L hemi weakness, L inattention, impiared cognition. Improved mobility with needed min-modA +2 for transfers and mobiltiy     Mobility   Overal bed mobility: Needs Assistance Bed Mobility: Sit to Sidelying, Sidelying to Sit Rolling: Mod assist Sidelying to sit: Min assist, HOB elevated, Used rails Supine to sit: Contact guard Sit to supine: Min assist Sit to sidelying: Contact guard assist, Used rails General bed mobility comments: Assist to elevate trunk into sitting. Assist for balance when scooting hips to EOB.     Transfers   Overall transfer level: Needs assistance Equipment used: Rolling walker (2 wheels) Transfers: Sit to/from Stand Sit to Stand: Min assist, From elevated surface Bed to/from chair/wheelchair/BSC transfer type:: Stand pivot Stand pivot transfers: Mod assist General transfer comment: Assist to power up and stabilize. Multimodal cues for hand placement and technique.     Ambulation / Gait / Stairs / Wheelchair Mobility   Ambulation/Gait Ambulation/Gait assistance: Mod assist Gait Distance (Feet): 6 Feet (3' forward and back and 6'  forward and back) Assistive device: Rolling walker (2 wheels) Gait Pattern/deviations: Step-to pattern, Ataxic, Decreased weight shift to right, Decreased  stance time - right General Gait Details: Used mirror in front of pt to work on correcting lt lean. Assist for balance and support. Stepping forward pt able to keep feet apart without cross over. Stepping baclwards more difficulty with base of support narrowing. Gait velocity: decr Gait velocity interpretation: <1.31 ft/sec, indicative of household ambulator     Posture / Balance Dynamic Sitting Balance Sitting balance - Comments: Static able to self correct Balance Overall balance assessment: Needs assistance Sitting-balance support: No upper extremity supported, Feet supported Sitting balance-Leahy Scale: Fair Sitting balance - Comments: Static able to self correct Postural control: Left lateral lean Standing balance support: Bilateral upper extremity supported, During functional activity, Reliant on assistive device for balance Standing balance-Leahy Scale: Poor Standing balance comment: walker and min assist for static standing     Special needs/care consideration Continuous Drip IV  tube feeds and Skin midline incision    Previous Home Environment (from acute therapy documentation) Living Arrangements: Robinson/significant other  Lives With: Robinson, Family (grandson) Available Help at Discharge: Family, Available 24 hours/day Type of Home: House Home Layout: One level Home Access: Ramped entrance Entrance Stairs-Rails: Right, Left Entrance Stairs-Number of Steps: 5 Bathroom Shower/Tub: Health visitor: Standard (pt does have seat to go over toilet) Bathroom Accessibility: Yes How Accessible: Accessible via walker Home Care Services: No Additional Comments: sister has a WC if needed   Discharge Living Setting Plans for Discharge Living Setting: Patient's home Type of Home at Discharge: House Discharge Home  Layout: One level Discharge Home Access: Ramped entrance Discharge Bathroom Shower/Tub: Walk-in shower Discharge Bathroom Toilet: Standard (pt does have seat to go over toilet) Discharge Bathroom Accessibility: Yes How Accessible: Accessible via walker Does the patient have any problems obtaining your medications?: No   Social/Family/Support Systems Anticipated Caregiver: Theodoro Kalata, daughter Anticipated Caregiver's Contact Information: [phone] Caregiver Availability: 24/7 Discharge Plan Discussed with Primary Caregiver: Yes Is Caregiver In Agreement with Plan?: Yes Does Caregiver/Family have Issues with Lodging/Transportation while Pt is in Rehab?: No   Goals Patient/Family Goal for Rehab: PT/OT/SLP supervision to mod I Expected length of stay: 10-12 days Additional Information: Discharge plan: home with pt's sister providing 24/7 supervision if needed Pt/Family Agrees to Admission and willing to participate: Yes Program Orientation Provided & Reviewed with Pt/Caregiver Including Roles  & Responsibilities: Yes   Decrease burden of Care through IP rehab admission: NA   Possible need for SNF placement upon discharge: Not anticipated   Patient Condition: I have reviewed medical records from [place], [facility], and [facility], spoken with  [facility] , and patient and family member. I met with patient at the bedside for inpatient rehabilitation assessment.  Patient will benefit from ongoing PT, OT, and SLP, can actively participate in 3 hours of therapy a day 5 days of the week, and can make measurable gains during the admission.  Patient will also benefit from the coordinated team approach during an Inpatient Acute Rehabilitation admission.  The patient will receive intensive therapy as well as Rehabilitation physician, nursing, social worker, and care management interventions.  Due to bladder management, bowel management, safety, skin/wound care, disease management, medication  administration, pain management, and patient education the patient requires 24 hour a day rehabilitation nursing.  The patient is currently min to max assist with mobility and basic ADLs.  Discharge setting and therapy post discharge at home with home health is anticipated.  Patient has agreed to participate in the Acute Inpatient Rehabilitation Program and will admit today.  Preadmission Screen Completed By: Estill Dooms, PT, DPT and Domingo Pulse, 10/25/2022 9:51 AM ______________________________________________________________________   Discussed status with Dr. Berline Chough on 10/25/22  at 9:51 AM  and received approval for admission today.   Admission Coordinator:  Estill Dooms, PT, DPT and Lauren Luvenia Starch, CCC-SLP, time 9:51 AM Dorna Bloom 10/25/22     Assessment/Plan: Diagnosis: L PICA/AICA strokes  Does the need for close, 24 hr/day Medical supervision in concert with the patient's rehab needs make it unreasonable for this patient to be served in a less intensive setting? Yes Co-Morbidities requiring supervision/potential complications: Cecal volvulus s/p hemicolectomy; HTN, multiple strokes; PUD; hashimoto's; COPD' autoimmune Urticaria; HLD Due to bladder management, bowel management, safety, skin/wound care, disease management, medication administration, pain management, and patient education, does the patient require 24 hr/day rehab nursing? Yes Does the patient require coordinated care of a physician, rehab nurse, PT, OT, and SLP to address physical and functional deficits in the context of the above medical diagnosis(es)? Yes Addressing deficits in the following areas: balance, endurance, locomotion, strength, transferring, bowel/bladder control, bathing, dressing, feeding, grooming, toileting, swallowing, and psychosocial support Can the patient actively participate in an intensive therapy program of at least 3 hrs of therapy 5 days a week? Yes The potential for patient to make  measurable gains while on inpatient rehab is good Anticipated functional outcomes upon discharge from inpatient rehab: modified independent and supervision PT, modified independent and supervision OT, modified independent and supervision SLP Estimated rehab length of stay to reach the above functional goals is: 10-12 days Anticipated discharge destination: Home 10. Overall Rehab/Functional Prognosis: good     MD Signature:

## 2022-10-25 NOTE — Progress Notes (Signed)
Inpatient Rehab Admissions Coordinator:   I have a bed available for this patient to admit today.  Medical team in agreement.  I will let TOC and pt/family know.   Estill Dooms, PT, DPT Admissions Coordinator [phone] 10/25/22  9:50 AM

## 2022-10-25 NOTE — Progress Notes (Signed)
Patient ID: Joan Robinson, female   DOB: 1960/03/07, 62 y.o.   MRN: [id number] [facility] Surgery Progress Note  9 Days Post-Op  Subjective: Awaiting CIR. Tolerating TF. Patient and daughter are fine with G-tube if needed but would prefer to see whether she improved with further SLP. Increased drainage from inferior incision overnight.   Objective: Vital signs in last 24 hours: Temp:  [97.6 F (36.4 C)-98.4 F (36.9 C)] 97.6 F (36.4 C) (10/10 0827) Pulse Rate:  [82-98] 86 (10/10 0827) Resp:  [17-20] 20 (10/10 0827) BP: (123-139)/(77-89) 123/77 (10/10 0827) SpO2:  [90 %-97 %] 97 % (10/10 0827) Last BM Date : 10/23/22  Intake/Output from previous day: 10/09 0701 - 10/10 0700 In: -  Out: 850 [Urine:850] Intake/Output this shift: No intake/output data recorded.  PE: General: Alert, NAD Abd: Soft, ND, NT, +BS. Midline wound with erythema and drainage inferiorly, several staples removed and copious purulent drainage expressed, packed with dry gauze and covered with new dressing   Lab Results:  Recent Labs    10/23/22 0542 10/24/22 0452  WBC 7.6 8.9  HGB 11.6* 11.8*  HCT 35.5* 35.9*  PLT 207 263   BMET Recent Labs    10/23/22 0542 10/24/22 0452  NA 138 139  K 3.6 4.0  CL 102 105  CO2 25 21*  GLUCOSE 116* 134*  BUN 11 17  CREATININE 0.61 0.77  CALCIUM 8.6* 8.6*   PT/INR No results for input(s): "LABPROT", "INR" in the last 72 hours. CMP     Component Value Date/Time   NA 139 10/24/2022 0452   K 4.0 10/24/2022 0452   CL 105 10/24/2022 0452   CO2 21 (L) 10/24/2022 0452   GLUCOSE 134 (H) 10/24/2022 0452   BUN 17 10/24/2022 0452   CREATININE 0.77 10/24/2022 0452   CREATININE 0.91 09/02/2015 1352   CALCIUM 8.6 (L) 10/24/2022 0452   PROT 6.2 (L) 10/24/2022 0452   ALBUMIN 2.8 (L) 10/24/2022 0452   AST 25 10/24/2022 0452   ALT 26 10/24/2022 0452   ALKPHOS 84 10/24/2022 0452   BILITOT 0.3 10/24/2022 0452   GFRNONAA >60 10/24/2022 0452   GFRAA   12/16/2009 2144    >60        The eGFR has been calculated using the MDRD equation. This calculation has not been validated in all clinical situations. eGFR's persistently <60 mL/min signify possible Chronic Kidney Disease.   Lipase     Component Value Date/Time   LIPASE 10 (L) 10/13/2022 2116       Studies/Results: CT ABDOMEN PELVIS W CONTRAST  Result Date: 10/24/2022 CLINICAL DATA:  Weight loss, unintended. Evaluate anatomy for percutaneous gastrostomy tube placement. History of cecal volvulus. EXAM: CT ABDOMEN AND PELVIS WITH CONTRAST TECHNIQUE: Multidetector CT imaging of the abdomen and pelvis was performed using the standard protocol following bolus administration of intravenous contrast. RADIATION DOSE REDUCTION: This exam was performed according to the departmental dose-optimization program which includes automated exposure control, adjustment of the mA and/or kV according to patient size and/or use of iterative reconstruction technique. CONTRAST:  60mL OMNIPAQUE IOHEXOL 350 MG/ML SOLN COMPARISON:  10/16/2022 FINDINGS: Lower chest: Again noted are areas volume loss at both lung bases. Trace pleural fluid has resolved. Hepatobiliary: Minimal change in the 1.1 cm hypodensity in the right hepatic lobe. Decreased distention of the gallbladder. No significant biliary dilatation. Main portal venous system is patent. Pancreas: Unremarkable. No pancreatic ductal dilatation or surrounding inflammatory changes. Spleen: Normal in size without focal abnormality. Adrenals/Urinary  Tract: Adrenal glands are within normal limits. Negative for hydronephrosis. No suspicious renal lesion. Bladder is decompressed with a Foley catheter. Stomach/Bowel: Status post right hemicolectomy. Small bowel is decompressed compared to the prior CT. There is oral contrast in the stomach, small bowel and large bowel. Nasogastric tube is coiled the gastric cardia region. Contrast and debris within the stomach. No bowel  structures between the stomach and the anterior abdominal wall. Difficult to exclude mild wall thickening involving small bowel loops but no evidence for focal inflammation. Evidence for at least 2 duodenal diverticula. Vascular/Lymphatic: Aortic atherosclerosis. No enlarged abdominal or pelvic lymph nodes. Reproductive: Status post hysterectomy. No adnexal masses. Other: Ascites is markedly decreased. There is a small amount of residual fluid situated between the liver and right kidney. No suspicious intra-abdominal fluid collections or abscesses. No evidence for free air. Postoperative changes along the anterior abdominal wall compatible with recent surgery. Irregular fluid collection in the subcutaneous tissues below the skin staples. This subcutaneous collection roughly measures 8.2 x 3.5 x 3.4 cm. Musculoskeletal: No acute bone abnormality. IMPRESSION: 1. Postoperative changes compatible with a right hemicolectomy. Small bowel distension has resolved. 2. No bowel structures situated between the stomach and the anterior abdominal wall. The anatomy is likely amenable for percutaneous gastrostomy tube placement. 3. Postsurgical changes in the anterior abdominal wall including an irregular subcutaneous fluid collection. Electronically Signed   By: Richarda Overlie M.D.   On: 10/24/2022 17:52    Anti-infectives: Anti-infectives (From admission, onward)    Start     Dose/Rate Route Frequency Ordered Stop   10/16/22 2200  cefoTEtan (CEFOTAN) 2 g in sodium chloride 0.9 % 100 mL IVPB        2 g 200 mL/hr over 30 Minutes Intravenous Every 12 hours 10/16/22 1524 10/17/22 1113   10/16/22 0930  cefoTEtan (CEFOTAN) 2 g in sodium chloride 0.9 % 100 mL IVPB        2 g 200 mL/hr over 30 Minutes Intravenous On call to O.R. 10/16/22 0844 10/17/22 0659      FINAL MICROSCOPIC DIAGNOSIS:   A. COLON, RIGHT, RESECTION:       Segment of colon with terminal ileum with submucosal congestion and  serosal mesothelial reaction.        Vermiform appendix without significant diagnostic alteration.       Four lymph nodes, negative for metastatic carcinoma (0/4).       Negative for dysplasia or malignancy.   B. ADDITIONAL ILEUM, RESECTION:       Segment of small intestine without significant diagnostic  alteration.      Focal mesenteric hemorrhage.       Negative for dysplasia or malignancy.    Assessment/Plan Cecal volvulus  POD#9 s/p Laparoscopic converted to open right hemicolectomy 10/1 Dr. Cliffton Asters - Path benign as above - D1 diet per SLP. Continue TF@ goal until taking in enough PO then ok to wean off - would recommend holding off on PEG at the moment and allowing patient to continue to work with SLP - if not improving then reasonable to consider prior to DC - Mobilize, therapies - CIR pending  - removed staples from inferior incision this AM and drained purulent material, drainage should be adequate treatment for this would not recommend abx at this time - BID wet to dry dressing to open portion of wound    Acute stroke - Per Neuro. Started on Plavix 10/6. Hgb 11.8 yesterday, stable   FEN: D1 diet per SLP, TF's at  goal.  VTE: sq heparin. Plavix ID: Cefotetan 10/1>10/2. None currently. Aferbile. WBC wnl  Foley: Failed TOV, replaced 10/4. Good UOP.     LOS: 11 days    Juliet Rude, [facility] Surgery 10/25/2022, 9:32 AM Please see Amion for pager number during day hours 7:00am-4:30pm

## 2022-10-25 NOTE — Discharge Summary (Addendum)
Physician Discharge Summary   Patient: Joan Robinson MRN: [id number] DOB: 05-31-1960  Admit date:     10/13/2022  Discharge date: 10/25/22  Discharge Physician: Marcelino Duster   PCP: April Manson, NP   Recommendations at discharge:    PCP follow up 1 week.  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 *  Hospital Course: Joan Robinson is a 62 y.o. female with past medical history of esophageal strictures status post dilation, multiple hernias status post multiple surgeries, GERD/PUD, hypertension, hyperlipidemia, autoimmune urticaria, emphysema, sleep apnea who presented to the ED with right upper quadrant and right lower quadrant abdominal pain x 2 days associated nausea. She was found to have small bowel obstruction and was being managed inpatient at [facility]. Then, noted to have left facial droop, slurring of her speech, and CT head without contrast which was notable for a moderately large left PICA territory infarct. She was transferred to ICU at [facility] and managed for stroke. She's now s/p open right hemicolectomy on 10/1. Transferred to [facility] on 10/7. See previous notes for additional details.     Assessment and Plan: Principal Problem:   SBO (small bowel obstruction) (HCC) Active Problems:   Hypothyroidism   Hyperlipidemia   Essential hypertension   GERD (gastroesophageal reflux disease)   History of esophageal stricture   Chronic anemia   Sleep apnea   Diverticulosis   History of emphysema (HCC)   Protein-calorie malnutrition, severe   Acute  Stroke Cerebellar Edema  MRI 9/30 with large acute infarct affecting both L PICA and L AICA vascular territories.  Posterio fossa mass effect with effacement of the 4th ventricle inferiorly.  No evidence of obstructive hydrocephalus.  Mild inferior displacement of the ceebellar tonsils.  No more than mild mass effect upon the medulla. Serial head CT 10/2 showed unchanged appearance of L cerebellar infarct  10/4 showed stable size of L anterior inferior cerebellar hypodense nonhemorrhagic infarct - local mass efffect with effacement of the sulci - some mass effect on the 4th ventricle without obstruction Echo with EF 60-65%, no RWMA LDL 54, A1c 5.6. S/p 3% saline Appreciate neurology assistance - etiology unclear, recommending plavix (aspirin allergy) and crestor -> IR eval and management for PEG tube placement-> repeat CT abd/pelvis done is u. Patient and family would prefer to see if she shows any improvement with further SLP therapy. PEG tube placement on hold at this time.   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 Family preferred to wait before getting PEG tube, she wants to work with speech and swallow in rehab.   Neurogenic Dysphagia SLP recommending dysphagia 1, moderately thick liquids (honey thick). Continue SLP therapy to see if any improvement. No PEG at this time.   Incomplete Left Eye Closure Continue eyedrops/ointment Nighttime patch. Outpatient opthal follow up.   GERD Continue PPI   Glaucoma Home Eye drops Outpatient opthal follow up.  Dyslipidemia On Crestor    Hypothyroidism Continue Synthroid    Insomnia Melatonin/norco at night not working. Trazodone 50 at bedtime  Physician Discharge Summary   Patient: Joan Robinson MRN: [id number] DOB: 05-31-1960  Admit date:     10/13/2022  Discharge date: 10/25/22  Discharge Physician: Marcelino Duster   PCP: April Manson, NP   Recommendations at discharge:    PCP follow up 1 week.  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 *  Hospital Course: Joan Robinson is a 62 y.o. female with past medical history of esophageal strictures status post dilation, multiple hernias status post multiple surgeries, GERD/PUD, hypertension, hyperlipidemia, autoimmune urticaria, emphysema, sleep apnea who presented to the ED with right upper quadrant and right lower quadrant abdominal pain x 2 days associated nausea. She was found to have small bowel obstruction and was being managed inpatient at [facility]. Then, noted to have left facial droop, slurring of her speech, and CT head without contrast which was notable for a moderately large left PICA territory infarct. She was transferred to ICU at [facility] and managed for stroke. She's now s/p open right hemicolectomy on 10/1. Transferred to [facility] on 10/7. See previous notes for additional details.     Assessment and Plan: Principal Problem:   SBO (small bowel obstruction) (HCC) Active Problems:   Hypothyroidism   Hyperlipidemia   Essential hypertension   GERD (gastroesophageal reflux disease)   History of esophageal stricture   Chronic anemia   Sleep apnea   Diverticulosis   History of emphysema (HCC)   Protein-calorie malnutrition, severe   Acute  Stroke Cerebellar Edema  MRI 9/30 with large acute infarct affecting both L PICA and L AICA vascular territories.  Posterio fossa mass effect with effacement of the 4th ventricle inferiorly.  No evidence of obstructive hydrocephalus.  Mild inferior displacement of the ceebellar tonsils.  No more than mild mass effect upon the medulla. Serial head CT 10/2 showed unchanged appearance of L cerebellar infarct  10/4 showed stable size of L anterior inferior cerebellar hypodense nonhemorrhagic infarct - local mass efffect with effacement of the sulci - some mass effect on the 4th ventricle without obstruction Echo with EF 60-65%, no RWMA LDL 54, A1c 5.6. S/p 3% saline Appreciate neurology assistance - etiology unclear, recommending plavix (aspirin allergy) and crestor -> IR eval and management for PEG tube placement-> repeat CT abd/pelvis done is u. Patient and family would prefer to see if she shows any improvement with further SLP therapy. PEG tube placement on hold at this time.   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 Family preferred to wait before getting PEG tube, she wants to work with speech and swallow in rehab.   Neurogenic Dysphagia SLP recommending dysphagia 1, moderately thick liquids (honey thick). Continue SLP therapy to see if any improvement. No PEG at this time.   Incomplete Left Eye Closure Continue eyedrops/ointment Nighttime patch. Outpatient opthal follow up.   GERD Continue PPI   Glaucoma Home Eye drops Outpatient opthal follow up.  Dyslipidemia On Crestor    Hypothyroidism Continue Synthroid    Insomnia Melatonin/norco at night not working. Trazodone 50 at bedtime  Physician Discharge Summary   Patient: Joan Robinson MRN: [id number] DOB: 05-31-1960  Admit date:     10/13/2022  Discharge date: 10/25/22  Discharge Physician: Marcelino Duster   PCP: April Manson, NP   Recommendations at discharge:    PCP follow up 1 week.  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 *  Hospital Course: Joan Robinson is a 62 y.o. female with past medical history of esophageal strictures status post dilation, multiple hernias status post multiple surgeries, GERD/PUD, hypertension, hyperlipidemia, autoimmune urticaria, emphysema, sleep apnea who presented to the ED with right upper quadrant and right lower quadrant abdominal pain x 2 days associated nausea. She was found to have small bowel obstruction and was being managed inpatient at [facility]. Then, noted to have left facial droop, slurring of her speech, and CT head without contrast which was notable for a moderately large left PICA territory infarct. She was transferred to ICU at [facility] and managed for stroke. She's now s/p open right hemicolectomy on 10/1. Transferred to [facility] on 10/7. See previous notes for additional details.     Assessment and Plan: Principal Problem:   SBO (small bowel obstruction) (HCC) Active Problems:   Hypothyroidism   Hyperlipidemia   Essential hypertension   GERD (gastroesophageal reflux disease)   History of esophageal stricture   Chronic anemia   Sleep apnea   Diverticulosis   History of emphysema (HCC)   Protein-calorie malnutrition, severe   Acute  Stroke Cerebellar Edema  MRI 9/30 with large acute infarct affecting both L PICA and L AICA vascular territories.  Posterio fossa mass effect with effacement of the 4th ventricle inferiorly.  No evidence of obstructive hydrocephalus.  Mild inferior displacement of the ceebellar tonsils.  No more than mild mass effect upon the medulla. Serial head CT 10/2 showed unchanged appearance of L cerebellar infarct  10/4 showed stable size of L anterior inferior cerebellar hypodense nonhemorrhagic infarct - local mass efffect with effacement of the sulci - some mass effect on the 4th ventricle without obstruction Echo with EF 60-65%, no RWMA LDL 54, A1c 5.6. S/p 3% saline Appreciate neurology assistance - etiology unclear, recommending plavix (aspirin allergy) and crestor -> IR eval and management for PEG tube placement-> repeat CT abd/pelvis done is u. Patient and family would prefer to see if she shows any improvement with further SLP therapy. PEG tube placement on hold at this time.   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 Family preferred to wait before getting PEG tube, she wants to work with speech and swallow in rehab.   Neurogenic Dysphagia SLP recommending dysphagia 1, moderately thick liquids (honey thick). Continue SLP therapy to see if any improvement. No PEG at this time.   Incomplete Left Eye Closure Continue eyedrops/ointment Nighttime patch. Outpatient opthal follow up.   GERD Continue PPI   Glaucoma Home Eye drops Outpatient opthal follow up.  Dyslipidemia On Crestor    Hypothyroidism Continue Synthroid    Insomnia Melatonin/norco at night not working. Trazodone 50 at bedtime  measures 8.2 x 3.5 x 3.4 cm. Musculoskeletal: No acute bone abnormality. IMPRESSION: 1. Postoperative changes compatible with a right hemicolectomy. Small bowel distension has resolved. 2. No bowel structures situated between the stomach and the anterior abdominal wall. The anatomy is likely amenable for percutaneous gastrostomy tube placement. 3. Postsurgical changes in the anterior abdominal wall including an irregular subcutaneous fluid collection. Electronically Signed   By: Richarda Overlie M.D.   On: 10/24/2022 17:52   DG Swallowing Func-Speech Pathology  Result Date: 10/22/2022 Table formatting from the original result was not included. Modified Barium Swallow Study Patient Details Name: MIKAELAH TROSTLE MRN: [id number] Date of Birth: 1960/03/12 Today's Date: 10/22/2022 HPI/PMH: HPI: 62 yo admitted 9/28 with RUQ pain with SBO.  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 PMhx: HTN, HLD, peptic ulcer disease, diverticulitis, bipolar disorder, hypothyroidism, GERD, Hashimoto's disease. Hemicolectomy completed on 10/1. Clinical Impression: Clinical Impression: Pt presents with a neurogenic dysphagia. Oral phase is c/b poor bolus awareness and control with anterior loss from right side as well as posterior spillage into pharynx.  When pt was cued to actively accept spoon into mouth and seal her lips, she demonstrated improved control.  There was intermittent spilling of trace amounts of thin and nectar thick liquid into the airway - either before the onset of the pharyngeal or from residue after - but aspiration did not elicit a cough response.  There was adequate laryngeal mobility.  There was minimal residue overall.  Given concern at this time for silent aspiration, recommend starting conservative diet of dysphagia 1/honey thick liquids.  Eating will require great effort and concentration for Ms. Huffine  at this time, so I would not anticipate ample PO intake. Recommend continuing TF. SLP will follow for dysphagia tx. Factors that may increase risk of adverse event in presence of aspiration [name] & [name] 2021): Factors that may increase risk of adverse event in presence of aspiration [name] & [name] 2021): Weak cough Recommendations/Plan: Swallowing Evaluation Recommendations Swallowing Evaluation Recommendations Recommendations: PO diet PO Diet Recommendation: Dysphagia 1 (Pureed); Moderately thick liquids (Level 3, honey thick) Liquid Administration via: Cup; Spoon Medication Administration: Crushed with puree Supervision: Staff to assist with self-feeding Swallowing strategies  : Slow rate; Check for pocketing or oral holding; Check for anterior loss Oral care recommendations: Oral care BID (2x/day) Treatment Plan Treatment Plan Treatment recommendations: Therapy as outlined in treatment plan below  Functional status assessment: Patient has had a recent decline in their functional status and demonstrates the ability to make significant improvements in function in a reasonable and predictable amount of time. Treatment frequency: Min 2x/week Treatment duration: 2 weeks Interventions: Patient/family education; Trials of upgraded texture/liquids; Diet toleration management by SLP; Oropharyngeal exercises Recommendations Recommendations for follow up therapy are one component of a multi-disciplinary discharge planning process, led by the attending physician.  Recommendations may be updated based on patient status, additional functional criteria and insurance authorization. Assessment: Orofacial Exam: Orofacial Exam Oral Cavity: Oral Hygiene: WFL Oral Cavity - Dentition: Poor condition; Missing dentition Anatomy: Anatomy: WFL Boluses Administered: Boluses Administered Boluses Administered: Thin liquids (Level 0); Mildly thick liquids (Level 2, nectar thick); Moderately thick liquids (Level 3, honey thick); Puree  Oral Impairment Domain: Oral Impairment Domain Lip Closure: Escape beyond mid-chin Tongue control during bolus hold: Posterior escape of greater than half of bolus Bolus preparation/mastication: Slow prolonged chewing/mashing with complete recollection Initiation of pharyngeal swallow : Pyriform sinuses  Pharyngeal Impairment Domain:  measures 8.2 x 3.5 x 3.4 cm. Musculoskeletal: No acute bone abnormality. IMPRESSION: 1. Postoperative changes compatible with a right hemicolectomy. Small bowel distension has resolved. 2. No bowel structures situated between the stomach and the anterior abdominal wall. The anatomy is likely amenable for percutaneous gastrostomy tube placement. 3. Postsurgical changes in the anterior abdominal wall including an irregular subcutaneous fluid collection. Electronically Signed   By: Richarda Overlie M.D.   On: 10/24/2022 17:52   DG Swallowing Func-Speech Pathology  Result Date: 10/22/2022 Table formatting from the original result was not included. Modified Barium Swallow Study Patient Details Name: MIKAELAH TROSTLE MRN: [id number] Date of Birth: 1960/03/12 Today's Date: 10/22/2022 HPI/PMH: HPI: 62 yo admitted 9/28 with RUQ pain with SBO.  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 PMhx: HTN, HLD, peptic ulcer disease, diverticulitis, bipolar disorder, hypothyroidism, GERD, Hashimoto's disease. Hemicolectomy completed on 10/1. Clinical Impression: Clinical Impression: Pt presents with a neurogenic dysphagia. Oral phase is c/b poor bolus awareness and control with anterior loss from right side as well as posterior spillage into pharynx.  When pt was cued to actively accept spoon into mouth and seal her lips, she demonstrated improved control.  There was intermittent spilling of trace amounts of thin and nectar thick liquid into the airway - either before the onset of the pharyngeal or from residue after - but aspiration did not elicit a cough response.  There was adequate laryngeal mobility.  There was minimal residue overall.  Given concern at this time for silent aspiration, recommend starting conservative diet of dysphagia 1/honey thick liquids.  Eating will require great effort and concentration for Ms. Huffine  at this time, so I would not anticipate ample PO intake. Recommend continuing TF. SLP will follow for dysphagia tx. Factors that may increase risk of adverse event in presence of aspiration [name] & [name] 2021): Factors that may increase risk of adverse event in presence of aspiration [name] & [name] 2021): Weak cough Recommendations/Plan: Swallowing Evaluation Recommendations Swallowing Evaluation Recommendations Recommendations: PO diet PO Diet Recommendation: Dysphagia 1 (Pureed); Moderately thick liquids (Level 3, honey thick) Liquid Administration via: Cup; Spoon Medication Administration: Crushed with puree Supervision: Staff to assist with self-feeding Swallowing strategies  : Slow rate; Check for pocketing or oral holding; Check for anterior loss Oral care recommendations: Oral care BID (2x/day) Treatment Plan Treatment Plan Treatment recommendations: Therapy as outlined in treatment plan below  Functional status assessment: Patient has had a recent decline in their functional status and demonstrates the ability to make significant improvements in function in a reasonable and predictable amount of time. Treatment frequency: Min 2x/week Treatment duration: 2 weeks Interventions: Patient/family education; Trials of upgraded texture/liquids; Diet toleration management by SLP; Oropharyngeal exercises Recommendations Recommendations for follow up therapy are one component of a multi-disciplinary discharge planning process, led by the attending physician.  Recommendations may be updated based on patient status, additional functional criteria and insurance authorization. Assessment: Orofacial Exam: Orofacial Exam Oral Cavity: Oral Hygiene: WFL Oral Cavity - Dentition: Poor condition; Missing dentition Anatomy: Anatomy: WFL Boluses Administered: Boluses Administered Boluses Administered: Thin liquids (Level 0); Mildly thick liquids (Level 2, nectar thick); Moderately thick liquids (Level 3, honey thick); Puree  Oral Impairment Domain: Oral Impairment Domain Lip Closure: Escape beyond mid-chin Tongue control during bolus hold: Posterior escape of greater than half of bolus Bolus preparation/mastication: Slow prolonged chewing/mashing with complete recollection Initiation of pharyngeal swallow : Pyriform sinuses  Pharyngeal Impairment Domain:  measures 8.2 x 3.5 x 3.4 cm. Musculoskeletal: No acute bone abnormality. IMPRESSION: 1. Postoperative changes compatible with a right hemicolectomy. Small bowel distension has resolved. 2. No bowel structures situated between the stomach and the anterior abdominal wall. The anatomy is likely amenable for percutaneous gastrostomy tube placement. 3. Postsurgical changes in the anterior abdominal wall including an irregular subcutaneous fluid collection. Electronically Signed   By: Richarda Overlie M.D.   On: 10/24/2022 17:52   DG Swallowing Func-Speech Pathology  Result Date: 10/22/2022 Table formatting from the original result was not included. Modified Barium Swallow Study Patient Details Name: MIKAELAH TROSTLE MRN: [id number] Date of Birth: 1960/03/12 Today's Date: 10/22/2022 HPI/PMH: HPI: 62 yo admitted 9/28 with RUQ pain with SBO.  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 PMhx: HTN, HLD, peptic ulcer disease, diverticulitis, bipolar disorder, hypothyroidism, GERD, Hashimoto's disease. Hemicolectomy completed on 10/1. Clinical Impression: Clinical Impression: Pt presents with a neurogenic dysphagia. Oral phase is c/b poor bolus awareness and control with anterior loss from right side as well as posterior spillage into pharynx.  When pt was cued to actively accept spoon into mouth and seal her lips, she demonstrated improved control.  There was intermittent spilling of trace amounts of thin and nectar thick liquid into the airway - either before the onset of the pharyngeal or from residue after - but aspiration did not elicit a cough response.  There was adequate laryngeal mobility.  There was minimal residue overall.  Given concern at this time for silent aspiration, recommend starting conservative diet of dysphagia 1/honey thick liquids.  Eating will require great effort and concentration for Ms. Huffine  at this time, so I would not anticipate ample PO intake. Recommend continuing TF. SLP will follow for dysphagia tx. Factors that may increase risk of adverse event in presence of aspiration [name] & [name] 2021): Factors that may increase risk of adverse event in presence of aspiration [name] & [name] 2021): Weak cough Recommendations/Plan: Swallowing Evaluation Recommendations Swallowing Evaluation Recommendations Recommendations: PO diet PO Diet Recommendation: Dysphagia 1 (Pureed); Moderately thick liquids (Level 3, honey thick) Liquid Administration via: Cup; Spoon Medication Administration: Crushed with puree Supervision: Staff to assist with self-feeding Swallowing strategies  : Slow rate; Check for pocketing or oral holding; Check for anterior loss Oral care recommendations: Oral care BID (2x/day) Treatment Plan Treatment Plan Treatment recommendations: Therapy as outlined in treatment plan below  Functional status assessment: Patient has had a recent decline in their functional status and demonstrates the ability to make significant improvements in function in a reasonable and predictable amount of time. Treatment frequency: Min 2x/week Treatment duration: 2 weeks Interventions: Patient/family education; Trials of upgraded texture/liquids; Diet toleration management by SLP; Oropharyngeal exercises Recommendations Recommendations for follow up therapy are one component of a multi-disciplinary discharge planning process, led by the attending physician.  Recommendations may be updated based on patient status, additional functional criteria and insurance authorization. Assessment: Orofacial Exam: Orofacial Exam Oral Cavity: Oral Hygiene: WFL Oral Cavity - Dentition: Poor condition; Missing dentition Anatomy: Anatomy: WFL Boluses Administered: Boluses Administered Boluses Administered: Thin liquids (Level 0); Mildly thick liquids (Level 2, nectar thick); Moderately thick liquids (Level 3, honey thick); Puree  Oral Impairment Domain: Oral Impairment Domain Lip Closure: Escape beyond mid-chin Tongue control during bolus hold: Posterior escape of greater than half of bolus Bolus preparation/mastication: Slow prolonged chewing/mashing with complete recollection Initiation of pharyngeal swallow : Pyriform sinuses  Pharyngeal Impairment Domain:  measures 8.2 x 3.5 x 3.4 cm. Musculoskeletal: No acute bone abnormality. IMPRESSION: 1. Postoperative changes compatible with a right hemicolectomy. Small bowel distension has resolved. 2. No bowel structures situated between the stomach and the anterior abdominal wall. The anatomy is likely amenable for percutaneous gastrostomy tube placement. 3. Postsurgical changes in the anterior abdominal wall including an irregular subcutaneous fluid collection. Electronically Signed   By: Richarda Overlie M.D.   On: 10/24/2022 17:52   DG Swallowing Func-Speech Pathology  Result Date: 10/22/2022 Table formatting from the original result was not included. Modified Barium Swallow Study Patient Details Name: MIKAELAH TROSTLE MRN: [id number] Date of Birth: 1960/03/12 Today's Date: 10/22/2022 HPI/PMH: HPI: 62 yo admitted 9/28 with RUQ pain with SBO.  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 PMhx: HTN, HLD, peptic ulcer disease, diverticulitis, bipolar disorder, hypothyroidism, GERD, Hashimoto's disease. Hemicolectomy completed on 10/1. Clinical Impression: Clinical Impression: Pt presents with a neurogenic dysphagia. Oral phase is c/b poor bolus awareness and control with anterior loss from right side as well as posterior spillage into pharynx.  When pt was cued to actively accept spoon into mouth and seal her lips, she demonstrated improved control.  There was intermittent spilling of trace amounts of thin and nectar thick liquid into the airway - either before the onset of the pharyngeal or from residue after - but aspiration did not elicit a cough response.  There was adequate laryngeal mobility.  There was minimal residue overall.  Given concern at this time for silent aspiration, recommend starting conservative diet of dysphagia 1/honey thick liquids.  Eating will require great effort and concentration for Ms. Huffine  at this time, so I would not anticipate ample PO intake. Recommend continuing TF. SLP will follow for dysphagia tx. Factors that may increase risk of adverse event in presence of aspiration [name] & [name] 2021): Factors that may increase risk of adverse event in presence of aspiration [name] & [name] 2021): Weak cough Recommendations/Plan: Swallowing Evaluation Recommendations Swallowing Evaluation Recommendations Recommendations: PO diet PO Diet Recommendation: Dysphagia 1 (Pureed); Moderately thick liquids (Level 3, honey thick) Liquid Administration via: Cup; Spoon Medication Administration: Crushed with puree Supervision: Staff to assist with self-feeding Swallowing strategies  : Slow rate; Check for pocketing or oral holding; Check for anterior loss Oral care recommendations: Oral care BID (2x/day) Treatment Plan Treatment Plan Treatment recommendations: Therapy as outlined in treatment plan below  Functional status assessment: Patient has had a recent decline in their functional status and demonstrates the ability to make significant improvements in function in a reasonable and predictable amount of time. Treatment frequency: Min 2x/week Treatment duration: 2 weeks Interventions: Patient/family education; Trials of upgraded texture/liquids; Diet toleration management by SLP; Oropharyngeal exercises Recommendations Recommendations for follow up therapy are one component of a multi-disciplinary discharge planning process, led by the attending physician.  Recommendations may be updated based on patient status, additional functional criteria and insurance authorization. Assessment: Orofacial Exam: Orofacial Exam Oral Cavity: Oral Hygiene: WFL Oral Cavity - Dentition: Poor condition; Missing dentition Anatomy: Anatomy: WFL Boluses Administered: Boluses Administered Boluses Administered: Thin liquids (Level 0); Mildly thick liquids (Level 2, nectar thick); Moderately thick liquids (Level 3, honey thick); Puree  Oral Impairment Domain: Oral Impairment Domain Lip Closure: Escape beyond mid-chin Tongue control during bolus hold: Posterior escape of greater than half of bolus Bolus preparation/mastication: Slow prolonged chewing/mashing with complete recollection Initiation of pharyngeal swallow : Pyriform sinuses  Pharyngeal Impairment Domain:  pulse sequences of the brain and surrounding structures were obtained without intravenous contrast. COMPARISON:  Prior head CT examinations  10/15/2022 and earlier. CT angiogram head/neck 10/14/2022. FINDINGS: Brain: Cerebral volume is normal. Large acute infarct within the left cerebellar hemisphere, within the left dorsolateral aspect of the medulla, within the left aspect of the caudal pons and within the left middle cerebellar peduncle (affecting both the left PICA and AICA vascular territories). There is posterior fossa mass effect with effacement of the fourth ventricle inferiorly. No evidence of obstructive hydrocephalus at this time. Mild inferior displacement of the cerebellar tonsils (which extend up to 4 mm below the level of the foramen magnum). No more than mild mass effect upon the medulla appreciated. There are a few small foci of T2 FLAIR hyperintense signal abnormality scattered within the cerebral white matter, nonspecific but most often secondary to chronic small vessel ischemia. No evidence of an intracranial mass. No chronic intracranial blood products. No extra-axial fluid collection Vascular: It is difficult to assess for flow voids within the cerebellar arteries due to small vessel size. Flow voids preserved elsewhere within the proximal large arterial vessels. Skull and upper cervical spine: No focal recent marrow lesion. Sinuses/Orbits: No mass or acute finding within the imaged orbits. Prior bilateral ocular lens replacement. Minimal mucosal thickening within the bilateral ethmoid sinuses. These results will be called to the ordering clinician or representative by the Radiologist Assistant, and communication documented in the PACS or [employer]. 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 Posterior fossa mass effect with effacement of the fourth ventricle inferiorly. No evidence of obstructive hydrocephalus at this time. Mild inferior displacement of the cerebellar tonsils (which extend up to 4 mm below the level of the foramen magnum). No more than mild  mass effect upon the medulla at this time. Close imaging follow-up is recommended to monitor the evolving posterior fossa mass effect. Electronically Signed   By: Jackey Loge D.O.   On: 10/15/2022 15:51   DG ABD ACUTE 2+V W 1V CHEST  Result Date: 10/15/2022 CLINICAL DATA:  Cecal volvulus EXAM: DG ABDOMEN ACUTE WITH 1 VIEW CHEST COMPARISON:  CT abdomen and pelvis dated October 13, 2022 FINDINGS: Enteric tube tip is in the stomach with side port near the GE junction. Dilated loops of large bowel seen in the right upper quadrant and multiple dilated loops of small bowel. Patient rotation limits evaluation of the lungs. Within limitations, there are mild bibasilar opacities which are likely due to atelectasis. Supine technique limits evaluation for free air. IMPRESSION: 1. 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 2. Enteric tube tip is in the stomach with side port near the GE junction, consider advancement for optimal positioning. Electronically Signed   By: Allegra Lai M.D.   On: 10/15/2022 14:59   ECHOCARDIOGRAM COMPLETE  Result Date: 10/15/2022    ECHOCARDIOGRAM REPORT   Patient Name:   DARIEN MIGNOGNA Date of Exam: 10/15/2022 Medical Rec #:  [id number]      Height:       66.0 in Accession #:    [id number]     Weight:       161.2 lb Date of Birth:  January 24, 1960      BSA:          1.824 m Patient Age:  pulse sequences of the brain and surrounding structures were obtained without intravenous contrast. COMPARISON:  Prior head CT examinations  10/15/2022 and earlier. CT angiogram head/neck 10/14/2022. FINDINGS: Brain: Cerebral volume is normal. Large acute infarct within the left cerebellar hemisphere, within the left dorsolateral aspect of the medulla, within the left aspect of the caudal pons and within the left middle cerebellar peduncle (affecting both the left PICA and AICA vascular territories). There is posterior fossa mass effect with effacement of the fourth ventricle inferiorly. No evidence of obstructive hydrocephalus at this time. Mild inferior displacement of the cerebellar tonsils (which extend up to 4 mm below the level of the foramen magnum). No more than mild mass effect upon the medulla appreciated. There are a few small foci of T2 FLAIR hyperintense signal abnormality scattered within the cerebral white matter, nonspecific but most often secondary to chronic small vessel ischemia. No evidence of an intracranial mass. No chronic intracranial blood products. No extra-axial fluid collection Vascular: It is difficult to assess for flow voids within the cerebellar arteries due to small vessel size. Flow voids preserved elsewhere within the proximal large arterial vessels. Skull and upper cervical spine: No focal recent marrow lesion. Sinuses/Orbits: No mass or acute finding within the imaged orbits. Prior bilateral ocular lens replacement. Minimal mucosal thickening within the bilateral ethmoid sinuses. These results will be called to the ordering clinician or representative by the Radiologist Assistant, and communication documented in the PACS or [employer]. 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 Posterior fossa mass effect with effacement of the fourth ventricle inferiorly. No evidence of obstructive hydrocephalus at this time. Mild inferior displacement of the cerebellar tonsils (which extend up to 4 mm below the level of the foramen magnum). No more than mild  mass effect upon the medulla at this time. Close imaging follow-up is recommended to monitor the evolving posterior fossa mass effect. Electronically Signed   By: Jackey Loge D.O.   On: 10/15/2022 15:51   DG ABD ACUTE 2+V W 1V CHEST  Result Date: 10/15/2022 CLINICAL DATA:  Cecal volvulus EXAM: DG ABDOMEN ACUTE WITH 1 VIEW CHEST COMPARISON:  CT abdomen and pelvis dated October 13, 2022 FINDINGS: Enteric tube tip is in the stomach with side port near the GE junction. Dilated loops of large bowel seen in the right upper quadrant and multiple dilated loops of small bowel. Patient rotation limits evaluation of the lungs. Within limitations, there are mild bibasilar opacities which are likely due to atelectasis. Supine technique limits evaluation for free air. IMPRESSION: 1. 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 2. Enteric tube tip is in the stomach with side port near the GE junction, consider advancement for optimal positioning. Electronically Signed   By: Allegra Lai M.D.   On: 10/15/2022 14:59   ECHOCARDIOGRAM COMPLETE  Result Date: 10/15/2022    ECHOCARDIOGRAM REPORT   Patient Name:   DARIEN MIGNOGNA Date of Exam: 10/15/2022 Medical Rec #:  [id number]      Height:       66.0 in Accession #:    [id number]     Weight:       161.2 lb Date of Birth:  January 24, 1960      BSA:          1.824 m Patient Age:  Physician Discharge Summary   Patient: Joan Robinson MRN: [id number] DOB: 05-31-1960  Admit date:     10/13/2022  Discharge date: 10/25/22  Discharge Physician: Marcelino Duster   PCP: April Manson, NP   Recommendations at discharge:    PCP follow up 1 week.  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 *  Hospital Course: Joan Robinson is a 62 y.o. female with past medical history of esophageal strictures status post dilation, multiple hernias status post multiple surgeries, GERD/PUD, hypertension, hyperlipidemia, autoimmune urticaria, emphysema, sleep apnea who presented to the ED with right upper quadrant and right lower quadrant abdominal pain x 2 days associated nausea. She was found to have small bowel obstruction and was being managed inpatient at [facility]. Then, noted to have left facial droop, slurring of her speech, and CT head without contrast which was notable for a moderately large left PICA territory infarct. She was transferred to ICU at [facility] and managed for stroke. She's now s/p open right hemicolectomy on 10/1. Transferred to [facility] on 10/7. See previous notes for additional details.     Assessment and Plan: Principal Problem:   SBO (small bowel obstruction) (HCC) Active Problems:   Hypothyroidism   Hyperlipidemia   Essential hypertension   GERD (gastroesophageal reflux disease)   History of esophageal stricture   Chronic anemia   Sleep apnea   Diverticulosis   History of emphysema (HCC)   Protein-calorie malnutrition, severe   Acute  Stroke Cerebellar Edema  MRI 9/30 with large acute infarct affecting both L PICA and L AICA vascular territories.  Posterio fossa mass effect with effacement of the 4th ventricle inferiorly.  No evidence of obstructive hydrocephalus.  Mild inferior displacement of the ceebellar tonsils.  No more than mild mass effect upon the medulla. Serial head CT 10/2 showed unchanged appearance of L cerebellar infarct  10/4 showed stable size of L anterior inferior cerebellar hypodense nonhemorrhagic infarct - local mass efffect with effacement of the sulci - some mass effect on the 4th ventricle without obstruction Echo with EF 60-65%, no RWMA LDL 54, A1c 5.6. S/p 3% saline Appreciate neurology assistance - etiology unclear, recommending plavix (aspirin allergy) and crestor -> IR eval and management for PEG tube placement-> repeat CT abd/pelvis done is u. Patient and family would prefer to see if she shows any improvement with further SLP therapy. PEG tube placement on hold at this time.   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 Family preferred to wait before getting PEG tube, she wants to work with speech and swallow in rehab.   Neurogenic Dysphagia SLP recommending dysphagia 1, moderately thick liquids (honey thick). Continue SLP therapy to see if any improvement. No PEG at this time.   Incomplete Left Eye Closure Continue eyedrops/ointment Nighttime patch. Outpatient opthal follow up.   GERD Continue PPI   Glaucoma Home Eye drops Outpatient opthal follow up.  Dyslipidemia On Crestor    Hypothyroidism Continue Synthroid    Insomnia Melatonin/norco at night not working. Trazodone 50 at bedtime  measures 8.2 x 3.5 x 3.4 cm. Musculoskeletal: No acute bone abnormality. IMPRESSION: 1. Postoperative changes compatible with a right hemicolectomy. Small bowel distension has resolved. 2. No bowel structures situated between the stomach and the anterior abdominal wall. The anatomy is likely amenable for percutaneous gastrostomy tube placement. 3. Postsurgical changes in the anterior abdominal wall including an irregular subcutaneous fluid collection. Electronically Signed   By: Richarda Overlie M.D.   On: 10/24/2022 17:52   DG Swallowing Func-Speech Pathology  Result Date: 10/22/2022 Table formatting from the original result was not included. Modified Barium Swallow Study Patient Details Name: MIKAELAH TROSTLE MRN: [id number] Date of Birth: 1960/03/12 Today's Date: 10/22/2022 HPI/PMH: HPI: 62 yo admitted 9/28 with RUQ pain with SBO.  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 PMhx: HTN, HLD, peptic ulcer disease, diverticulitis, bipolar disorder, hypothyroidism, GERD, Hashimoto's disease. Hemicolectomy completed on 10/1. Clinical Impression: Clinical Impression: Pt presents with a neurogenic dysphagia. Oral phase is c/b poor bolus awareness and control with anterior loss from right side as well as posterior spillage into pharynx.  When pt was cued to actively accept spoon into mouth and seal her lips, she demonstrated improved control.  There was intermittent spilling of trace amounts of thin and nectar thick liquid into the airway - either before the onset of the pharyngeal or from residue after - but aspiration did not elicit a cough response.  There was adequate laryngeal mobility.  There was minimal residue overall.  Given concern at this time for silent aspiration, recommend starting conservative diet of dysphagia 1/honey thick liquids.  Eating will require great effort and concentration for Ms. Huffine  at this time, so I would not anticipate ample PO intake. Recommend continuing TF. SLP will follow for dysphagia tx. Factors that may increase risk of adverse event in presence of aspiration [name] & [name] 2021): Factors that may increase risk of adverse event in presence of aspiration [name] & [name] 2021): Weak cough Recommendations/Plan: Swallowing Evaluation Recommendations Swallowing Evaluation Recommendations Recommendations: PO diet PO Diet Recommendation: Dysphagia 1 (Pureed); Moderately thick liquids (Level 3, honey thick) Liquid Administration via: Cup; Spoon Medication Administration: Crushed with puree Supervision: Staff to assist with self-feeding Swallowing strategies  : Slow rate; Check for pocketing or oral holding; Check for anterior loss Oral care recommendations: Oral care BID (2x/day) Treatment Plan Treatment Plan Treatment recommendations: Therapy as outlined in treatment plan below  Functional status assessment: Patient has had a recent decline in their functional status and demonstrates the ability to make significant improvements in function in a reasonable and predictable amount of time. Treatment frequency: Min 2x/week Treatment duration: 2 weeks Interventions: Patient/family education; Trials of upgraded texture/liquids; Diet toleration management by SLP; Oropharyngeal exercises Recommendations Recommendations for follow up therapy are one component of a multi-disciplinary discharge planning process, led by the attending physician.  Recommendations may be updated based on patient status, additional functional criteria and insurance authorization. Assessment: Orofacial Exam: Orofacial Exam Oral Cavity: Oral Hygiene: WFL Oral Cavity - Dentition: Poor condition; Missing dentition Anatomy: Anatomy: WFL Boluses Administered: Boluses Administered Boluses Administered: Thin liquids (Level 0); Mildly thick liquids (Level 2, nectar thick); Moderately thick liquids (Level 3, honey thick); Puree  Oral Impairment Domain: Oral Impairment Domain Lip Closure: Escape beyond mid-chin Tongue control during bolus hold: Posterior escape of greater than half of bolus Bolus preparation/mastication: Slow prolonged chewing/mashing with complete recollection Initiation of pharyngeal swallow : Pyriform sinuses  Pharyngeal Impairment Domain:  measures 8.2 x 3.5 x 3.4 cm. Musculoskeletal: No acute bone abnormality. IMPRESSION: 1. Postoperative changes compatible with a right hemicolectomy. Small bowel distension has resolved. 2. No bowel structures situated between the stomach and the anterior abdominal wall. The anatomy is likely amenable for percutaneous gastrostomy tube placement. 3. Postsurgical changes in the anterior abdominal wall including an irregular subcutaneous fluid collection. Electronically Signed   By: Richarda Overlie M.D.   On: 10/24/2022 17:52   DG Swallowing Func-Speech Pathology  Result Date: 10/22/2022 Table formatting from the original result was not included. Modified Barium Swallow Study Patient Details Name: MIKAELAH TROSTLE MRN: [id number] Date of Birth: 1960/03/12 Today's Date: 10/22/2022 HPI/PMH: HPI: 62 yo admitted 9/28 with RUQ pain with SBO.  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 PMhx: HTN, HLD, peptic ulcer disease, diverticulitis, bipolar disorder, hypothyroidism, GERD, Hashimoto's disease. Hemicolectomy completed on 10/1. Clinical Impression: Clinical Impression: Pt presents with a neurogenic dysphagia. Oral phase is c/b poor bolus awareness and control with anterior loss from right side as well as posterior spillage into pharynx.  When pt was cued to actively accept spoon into mouth and seal her lips, she demonstrated improved control.  There was intermittent spilling of trace amounts of thin and nectar thick liquid into the airway - either before the onset of the pharyngeal or from residue after - but aspiration did not elicit a cough response.  There was adequate laryngeal mobility.  There was minimal residue overall.  Given concern at this time for silent aspiration, recommend starting conservative diet of dysphagia 1/honey thick liquids.  Eating will require great effort and concentration for Ms. Huffine  at this time, so I would not anticipate ample PO intake. Recommend continuing TF. SLP will follow for dysphagia tx. Factors that may increase risk of adverse event in presence of aspiration [name] & [name] 2021): Factors that may increase risk of adverse event in presence of aspiration [name] & [name] 2021): Weak cough Recommendations/Plan: Swallowing Evaluation Recommendations Swallowing Evaluation Recommendations Recommendations: PO diet PO Diet Recommendation: Dysphagia 1 (Pureed); Moderately thick liquids (Level 3, honey thick) Liquid Administration via: Cup; Spoon Medication Administration: Crushed with puree Supervision: Staff to assist with self-feeding Swallowing strategies  : Slow rate; Check for pocketing or oral holding; Check for anterior loss Oral care recommendations: Oral care BID (2x/day) Treatment Plan Treatment Plan Treatment recommendations: Therapy as outlined in treatment plan below  Functional status assessment: Patient has had a recent decline in their functional status and demonstrates the ability to make significant improvements in function in a reasonable and predictable amount of time. Treatment frequency: Min 2x/week Treatment duration: 2 weeks Interventions: Patient/family education; Trials of upgraded texture/liquids; Diet toleration management by SLP; Oropharyngeal exercises Recommendations Recommendations for follow up therapy are one component of a multi-disciplinary discharge planning process, led by the attending physician.  Recommendations may be updated based on patient status, additional functional criteria and insurance authorization. Assessment: Orofacial Exam: Orofacial Exam Oral Cavity: Oral Hygiene: WFL Oral Cavity - Dentition: Poor condition; Missing dentition Anatomy: Anatomy: WFL Boluses Administered: Boluses Administered Boluses Administered: Thin liquids (Level 0); Mildly thick liquids (Level 2, nectar thick); Moderately thick liquids (Level 3, honey thick); Puree  Oral Impairment Domain: Oral Impairment Domain Lip Closure: Escape beyond mid-chin Tongue control during bolus hold: Posterior escape of greater than half of bolus Bolus preparation/mastication: Slow prolonged chewing/mashing with complete recollection Initiation of pharyngeal swallow : Pyriform sinuses  Pharyngeal Impairment Domain:  Physician Discharge Summary   Patient: Joan Robinson MRN: [id number] DOB: 05-31-1960  Admit date:     10/13/2022  Discharge date: 10/25/22  Discharge Physician: Marcelino Duster   PCP: April Manson, NP   Recommendations at discharge:    PCP follow up 1 week.  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 *  Hospital Course: Joan Robinson is a 62 y.o. female with past medical history of esophageal strictures status post dilation, multiple hernias status post multiple surgeries, GERD/PUD, hypertension, hyperlipidemia, autoimmune urticaria, emphysema, sleep apnea who presented to the ED with right upper quadrant and right lower quadrant abdominal pain x 2 days associated nausea. She was found to have small bowel obstruction and was being managed inpatient at [facility]. Then, noted to have left facial droop, slurring of her speech, and CT head without contrast which was notable for a moderately large left PICA territory infarct. She was transferred to ICU at [facility] and managed for stroke. She's now s/p open right hemicolectomy on 10/1. Transferred to [facility] on 10/7. See previous notes for additional details.     Assessment and Plan: Principal Problem:   SBO (small bowel obstruction) (HCC) Active Problems:   Hypothyroidism   Hyperlipidemia   Essential hypertension   GERD (gastroesophageal reflux disease)   History of esophageal stricture   Chronic anemia   Sleep apnea   Diverticulosis   History of emphysema (HCC)   Protein-calorie malnutrition, severe   Acute  Stroke Cerebellar Edema  MRI 9/30 with large acute infarct affecting both L PICA and L AICA vascular territories.  Posterio fossa mass effect with effacement of the 4th ventricle inferiorly.  No evidence of obstructive hydrocephalus.  Mild inferior displacement of the ceebellar tonsils.  No more than mild mass effect upon the medulla. Serial head CT 10/2 showed unchanged appearance of L cerebellar infarct  10/4 showed stable size of L anterior inferior cerebellar hypodense nonhemorrhagic infarct - local mass efffect with effacement of the sulci - some mass effect on the 4th ventricle without obstruction Echo with EF 60-65%, no RWMA LDL 54, A1c 5.6. S/p 3% saline Appreciate neurology assistance - etiology unclear, recommending plavix (aspirin allergy) and crestor -> IR eval and management for PEG tube placement-> repeat CT abd/pelvis done is u. Patient and family would prefer to see if she shows any improvement with further SLP therapy. PEG tube placement on hold at this time.   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 Family preferred to wait before getting PEG tube, she wants to work with speech and swallow in rehab.   Neurogenic Dysphagia SLP recommending dysphagia 1, moderately thick liquids (honey thick). Continue SLP therapy to see if any improvement. No PEG at this time.   Incomplete Left Eye Closure Continue eyedrops/ointment Nighttime patch. Outpatient opthal follow up.   GERD Continue PPI   Glaucoma Home Eye drops Outpatient opthal follow up.  Dyslipidemia On Crestor    Hypothyroidism Continue Synthroid    Insomnia Melatonin/norco at night not working. Trazodone 50 at bedtime  pulse sequences of the brain and surrounding structures were obtained without intravenous contrast. COMPARISON:  Prior head CT examinations  10/15/2022 and earlier. CT angiogram head/neck 10/14/2022. FINDINGS: Brain: Cerebral volume is normal. Large acute infarct within the left cerebellar hemisphere, within the left dorsolateral aspect of the medulla, within the left aspect of the caudal pons and within the left middle cerebellar peduncle (affecting both the left PICA and AICA vascular territories). There is posterior fossa mass effect with effacement of the fourth ventricle inferiorly. No evidence of obstructive hydrocephalus at this time. Mild inferior displacement of the cerebellar tonsils (which extend up to 4 mm below the level of the foramen magnum). No more than mild mass effect upon the medulla appreciated. There are a few small foci of T2 FLAIR hyperintense signal abnormality scattered within the cerebral white matter, nonspecific but most often secondary to chronic small vessel ischemia. No evidence of an intracranial mass. No chronic intracranial blood products. No extra-axial fluid collection Vascular: It is difficult to assess for flow voids within the cerebellar arteries due to small vessel size. Flow voids preserved elsewhere within the proximal large arterial vessels. Skull and upper cervical spine: No focal recent marrow lesion. Sinuses/Orbits: No mass or acute finding within the imaged orbits. Prior bilateral ocular lens replacement. Minimal mucosal thickening within the bilateral ethmoid sinuses. These results will be called to the ordering clinician or representative by the Radiologist Assistant, and communication documented in the PACS or [employer]. 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 Posterior fossa mass effect with effacement of the fourth ventricle inferiorly. No evidence of obstructive hydrocephalus at this time. Mild inferior displacement of the cerebellar tonsils (which extend up to 4 mm below the level of the foramen magnum). No more than mild  mass effect upon the medulla at this time. Close imaging follow-up is recommended to monitor the evolving posterior fossa mass effect. Electronically Signed   By: Jackey Loge D.O.   On: 10/15/2022 15:51   DG ABD ACUTE 2+V W 1V CHEST  Result Date: 10/15/2022 CLINICAL DATA:  Cecal volvulus EXAM: DG ABDOMEN ACUTE WITH 1 VIEW CHEST COMPARISON:  CT abdomen and pelvis dated October 13, 2022 FINDINGS: Enteric tube tip is in the stomach with side port near the GE junction. Dilated loops of large bowel seen in the right upper quadrant and multiple dilated loops of small bowel. Patient rotation limits evaluation of the lungs. Within limitations, there are mild bibasilar opacities which are likely due to atelectasis. Supine technique limits evaluation for free air. IMPRESSION: 1. 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 2. Enteric tube tip is in the stomach with side port near the GE junction, consider advancement for optimal positioning. Electronically Signed   By: Allegra Lai M.D.   On: 10/15/2022 14:59   ECHOCARDIOGRAM COMPLETE  Result Date: 10/15/2022    ECHOCARDIOGRAM REPORT   Patient Name:   DARIEN MIGNOGNA Date of Exam: 10/15/2022 Medical Rec #:  [id number]      Height:       66.0 in Accession #:    [id number]     Weight:       161.2 lb Date of Birth:  January 24, 1960      BSA:          1.824 m Patient Age:

## 2022-10-25 NOTE — H&P (Signed)
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 CT scan abdomen and pelvis shows area of narrowing within the ascending colon distal to the cecum as well as an associated area of narrowing in the terminal ileum with focal dilatation of the cecum and proximal ascending colon as well as the small bowel proximal to the terminal ileum these changes are suspicious for internal hernia causing the long segment of narrowing and subsequent small bowel obstruction. General surgery consulted and she was treating conservatively initially with NGT decompression. Her nurse noted her left eye was drooping around 5 PM 9/29 and subsequently later in the evening someone noted her left face was drooping. RR/code stroke initiated and neurology consulted. CT head reveals what appears to be an acute left cerebellar infarct as well as possible involvement in the left medulla suspicious for possible acute posterior inferior cerebellar infarct. CTA of head and neck,CT perfusion negative for LVO, no core or mismatch. Given the moderately large posterior fossa stroke which is in proximity to brainstem, and the extension of the stroke into the brainstem, she was transferred to [facility] medical ICU for immediate in person neurology evaluation and for close monitoring for signs of posterior fossa crowding and potential herniation. She has allergy to aspirin and Plavix held due to need for abdominal surgery. Treated for cerebellar edema with 3% saline. Repeat CT Abd/pelvis 10/01 demonstrated persistent and progressive findings. 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 Dr. Cliffton Asters discussed findings and recommendations for surgical intervention with  patient and family. She underwent laparoscopic converted to open right hemicolectomy. CT repeated 10/02 unchanged cerebellar infarct, no hemorrhage, no hydrocephalus. Started on po liquids 10/03. Left facial droop, left gaze deficit and left ataxia. Cortrak placed on 10/04. Tapered hypertonic saline and started on Plavix. Transferred to hospitalist service. VTE prophy started with heparin. PEG tube discussed with patient and sister and in agreement. Appears that this is on hold until further monitoring of PO intake. She was advanced to D1 Diet with honey thick liquids. Developed draining from inferior aspect of midline incision today and this was drained at the bedside. The patient requires inpatient medicine and rehabilitation evaluations and services for ongoing dysfunction secondary to cerebellar stroke.   Currently, has been medicated for pain and very sleepy. Awakens easily but drifts back to sleep. Husband and daughter at bedside.     Pt snoring and cannot wake up due to recent pain meds.     Review of Systems  Unable to perform ROS: Acuity of condition  Eyes:        Left upper lid does not close and is wearing a patch  Genitourinary:        In dwelling Foley cath in place>>amber colored urine  All other systems reviewed and are negative.       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   BCC (basal cell carcinoma of skin) 06/01/2012    Leg  Follows with Dr Margo Aye   Bipolar disorder [facility]) 01/17/2007    Qualifier: Diagnosis of  By: Everardo All MD, Gregary Signs A    Cataract     Diverticulosis     Diverticulosis     Emphysema of lung (HCC)     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   Gallstones     GERD (gastroesophageal reflux disease)     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   Hashimoto's disease     Hyperlipidemia     Hypertension     Hypothyroidism 08/24/2006    Qualifier: Diagnosis of  By: Everardo All MD, Sean A     IBS (irritable bowel syndrome) 07/27/2016   Obesity 11/01/2013   PUD (peptic ulcer disease)     Sleep apnea 04/27/2016   Tobacco abuse               Past Surgical History:  Procedure Laterality Date   ABDOMINAL HYSTERECTOMY       ABDOMINAL HYSTERECTOMY   01/15/2009    complete   COLONOSCOPY       DILATION AND CURETTAGE OF UTERUS   01/16/1983   EYE SURGERY   03/03/2014    Surgery on both eyes for epiretinal membrane (vitreous peel)   Gated Spect wall motion stress cardiolite   11/05/2001   HIATAL HERNIA REPAIR N/A 07/12/2021    Procedure: LAPAROSCOPY W/ EXTENSIVE FOREGUT DISSECTION; PARTIAL STOMACH REDUCTION; GASTROSTOMY TUBE PLACEMENT; GASTROPEXY;  Surgeon: Luretha Murphy, MD;  Location: [facility] ORS;  Service: General;  Laterality: N/A;   LAPAROSCOPIC RIGHT HEMI COLECTOMY Right 10/16/2022    Procedure: LAPAROSCOPIC ASSISTED RIGHT HEMI COLECTOMY;  Surgeon: Andria Meuse, MD;  Location: [facility] OR;  Service: General;  Laterality: Right;   POLYPECTOMY       TUBAL LIGATION   01/15/1993   UTERINE SUSPENSION        mesh   VITRECTOMY Bilateral 03/03/2014   XI ROBOTIC ASSISTED HIATAL HERNIA REPAIR N/A 05/01/2021    Procedure: XI ROBOTIC ASSISTED TYPE III HIATAL HERNIA REPAIR WITH FUNDOPLICATION;  Surgeon: Luretha Murphy, MD;  Location: [facility] ORS;  Service: General;  Laterality: N/A;             Family History  Problem Relation Age of Onset   Heart disease Mother 69        stents   Stroke Mother     Hyperlipidemia Mother     Hypertension Mother     Other Mother          blood disorder- mgus   Colon polyps Father     Other Father          aorta disection   Aneurysm Father     Hypertension Sister          ?   Arthritis Sister     Other Sister          thyroid   Other Sister          thyroid   Irritable  bowel syndrome Daughter     Other Daughter          gastritis   Mental illness Daughter          bipolar and mood disorder   Mental illness Son          bipolar   Colon cancer Paternal Uncle          Social History:  reports that she quit smoking about 13 years ago. Her smoking use included cigarettes. She started smoking about 43 years  ago. She has a 30 pack-year smoking history. She has never used smokeless tobacco. She reports that she does not drink alcohol and does not use drugs. Allergies:  Allergies       Allergies  Allergen Reactions   Aspirin Shortness Of Breath   Nsaids Shortness Of Breath   Keflex [Cephalexin] Diarrhea   Butamben-Tetracaine-Benzocaine        Mouth sores   Dicyclomine Hcl        REACTION: mouth ulcers   Metronidazole Hives      mouth ulcers   Phenergan [Promethazine Hcl] Other (See Comments)      "knocks" pt out for about 3 days   Quetiapine        Somnolence. Slept for 36 hours straight.   Benay Spice [Lifitegrast]        Burned eyes   Cymbalta [Duloxetine Hcl] Rash   Levothyroxine Palpitations      Pt must use brand SYNTHROID   Moxifloxacin Nausea Only and Palpitations      REACTION: increased heart rate   Moxifloxacin Hcl In Nacl Palpitations   Orphenadrine Citrate Palpitations   Sulfamethoxazole-Trimethoprim Nausea Only and Palpitations      REACTION: increased heart rate, nausea            Medications Prior to Admission  Medication Sig Dispense Refill   calcium carbonate (TUMS) 500 MG chewable tablet Chew 1-2 tablets by mouth as needed for indigestion.       cetirizine (ZYRTEC) 10 MG tablet Take 10 mg by mouth daily as needed for allergies.       Cholecalciferol (D 1000) 25 MCG (1000 UT) capsule Take 1,000 Units by mouth daily.       estradiol (ESTRACE) 0.1 MG/GM vaginal cream Place 1 Applicatorful vaginally 2 (two) times a week.       HYDROcodone-acetaminophen (NORCO) 10-325 MG tablet Take 1 tablet by mouth every 8 (eight) hours as needed.        nitroGLYCERIN (NITROSTAT) 0.4 MG SL tablet Place 0.4 mg under the tongue every 5 (five) minutes as needed for chest pain.       ondansetron (ZOFRAN) 4 MG tablet Take 1 tablet (4 mg total) by mouth every 8 (eight) hours as needed for nausea or vomiting. Take 1-2 tablets every 4-6 hours as needed for nausea (Patient taking differently: Take 4 mg by mouth every 8 (eight) hours as needed for nausea or vomiting.) 30 tablet 2   raNITIdine HCl (RANITIDINE 75 PO) Take 1 tablet by mouth daily as needed (Hives).       rosuvastatin (CRESTOR) 20 MG tablet Take 1 tablet (20 mg total) by mouth daily. 90 tablet 3   SYNTHROID 88 MCG tablet Take 88 mcg by mouth daily before breakfast.       escitalopram (LEXAPRO) 10 MG tablet Take 10 mg by mouth daily.       latanoprost (XALATAN) 0.005 % ophthalmic solution Place 1 drop into both eyes at bedtime.       lisdexamfetamine (VYVANSE) 70 MG capsule Take 70 mg by mouth daily.       pantoprazole (PROTONIX) 40 MG tablet Take 1 tablet (40 mg total) by mouth daily. 90 tablet 3              Home: Home Living Family/patient expects to be discharged to:: Private residence Living Arrangements: Spouse/significant other Available Help at Discharge: Family, Available 24 hours/day Type of Home: House Home Access: Ramped entrance Entrance Stairs-Number of Steps: 5 Entrance Stairs-Rails: Right,  Left Home Layout: One level Bathroom Shower/Tub: Health visitor: Standard (pt does have seat to go over toilet) Bathroom Accessibility: Yes Home Equipment: Agricultural consultant (2 wheels), BSC/3in1, Shower seat, Medical laboratory scientific officer - single point Additional Comments: sister has a WC if needed  Lives With: Spouse, Family (grandson)   Functional History: Prior Function Prior Level of Function : Independent/Modified Independent, Driving Mobility Comments: indep ADLs Comments: indep, works   Functional Status:  Mobility: Bed Mobility Overal bed mobility: Needs  Assistance Bed Mobility: Sit to Sidelying, Sidelying to Sit Rolling: Mod assist Sidelying to sit: Min assist, HOB elevated, Used rails Supine to sit: Contact guard Sit to supine: Min assist Sit to sidelying: Contact guard assist, Used rails General bed mobility comments: Assist to elevate trunk into sitting. Assist for balance when scooting hips to EOB. Transfers Overall transfer level: Needs assistance Equipment used: Rolling walker (2 wheels) Transfers: Sit to/from Stand Sit to Stand: Min assist, From elevated surface Bed to/from chair/wheelchair/BSC transfer type:: Stand pivot Stand pivot transfers: Mod assist General transfer comment: Assist to power up and stabilize. Multimodal cues for hand placement and technique. Ambulation/Gait Ambulation/Gait assistance: Mod assist Gait Distance (Feet): 6 Feet (3' forward and back and 6' forward and back) Assistive device: Rolling walker (2 wheels) Gait Pattern/deviations: Step-to pattern, Ataxic, Decreased weight shift to right, Decreased stance time - right General Gait Details: Used mirror in front of pt to work on correcting lt lean. Assist for balance and support. Stepping forward pt able to keep feet apart without cross over. Stepping baclwards more difficulty with base of support narrowing. Gait velocity: decr Gait velocity interpretation: <1.31 ft/sec, indicative of household ambulator   ADL: ADL Overall ADL's : Needs assistance/impaired Eating/Feeding: Minimal assistance, Sitting Eating/Feeding Details (indicate cue type and reason): ice chips Grooming: Minimal assistance, Sitting Upper Body Bathing: Moderate assistance, Sitting Lower Body Bathing: Maximal assistance, Sit to/from stand Upper Body Dressing : Moderate assistance, Sitting Lower Body Dressing: Maximal assistance, +2 for physical assistance, +2 for safety/equipment, Sit to/from stand Lower Body Dressing Details (indicate cue type and reason): maximal cues for  attention and physical assist Toilet Transfer: Moderate assistance, +2 for physical assistance, +2 for safety/equipment, Ambulation, Regular Toilet, Rolling walker (2 wheels) Toileting- Clothing Manipulation and Hygiene: Total assistance, +2 for physical assistance, +2 for safety/equipment, Sit to/from stand Functional mobility during ADLs: Moderate assistance, Rolling walker (2 wheels) General ADL Comments: continues to be limited by L hemi weakness, L inattention, impiared cognition. Improved mobility with needed min-modA +2 for transfers and mobiltiy   Cognition: Cognition Overall Cognitive Status: Impaired/Different from baseline Arousal/Alertness: Awake/alert Orientation Level: Oriented X4 Attention: Sustained, Focused Focused Attention: Appears intact Sustained Attention: Impaired Sustained Attention Impairment: Verbal basic Problem Solving: Appears intact (for basic questions) Cognition Arousal: Alert Behavior During Therapy: WFL for tasks assessed/performed Overall Cognitive Status: Impaired/Different from baseline Area of Impairment: Problem solving, Safety/judgement, Following commands, Attention Orientation Level: Time, Disoriented to Current Attention Level: Selective Following Commands: Follows one step commands with increased time Safety/Judgement: Decreased awareness of safety, Decreased awareness of deficits Problem Solving: Slow processing, Requires verbal cues General Comments: able to attend to RN while working with OT at [facility] and return attention to OT   Physical Exam: Blood pressure 123/77, pulse 86, temperature 97.6 F (36.4 C), temperature source Oral, resp. rate 20, height 5\' 6"  (1.676 m), weight 70.9 kg, SpO2 97%. Physical Exam Vitals and nursing note reviewed. Exam conducted with a chaperone present.  Constitutional:      Comments:  Pt snoring loudly- supine in bed; has 1L O2 by [place]; L eyepatch in place, NAD Husband at bedside  HENT:     Head: Normocephalic  and atraumatic.     Comments: L facial droop L eye will not close- covered with eyepatch Cortrak in place- TF's off    Right Ear: External ear normal.     Left Ear: External ear normal.     Nose: Nose normal. No congestion.     Mouth/Throat:     Mouth: Mucous membranes are dry.     Pharynx: Oropharynx is clear. No oropharyngeal exudate.     Comments: Snoring with mouth open- is dry Eyes:     Comments: Left lower lid everted and erythematous  Cardiovascular:     Rate and Rhythm: Normal rate and regular rhythm.     Heart sounds: Normal heart sounds. No murmur heard.    No gallop.  Pulmonary:     Effort: Pulmonary effort is normal.     Breath sounds: Normal breath sounds.     Comments: Snoring loudly hard to hear, but doesn't sound like W/R/R Abdominal:     General: Bowel sounds are normal.     Comments: Dressing over midline incision intact- just changed C/D/I Hypoactive BS- mildly hypoactive- mildly TTP diffusely.   Genitourinary:    Comments: Foley- medium amber urine Musculoskeletal:     Cervical back: Neck supple. No tenderness.     Comments: Moving legs both extremely well and RUE- not spontaneously moving LUE much, however is asleep  Skin:    General: Skin is warm and dry.     Comments: IV L AC fossa and R forearm- look OK Sweating on face slightly- appears warm, but doesn't feel feverish  Neurological:     Comments: Somnolent- loudly snoring- cannot get her to wake up for more than ~ 30 seconds.      Psychiatric:     Comments: Too sleepy to assess        Lab Results Last 48 Hours        Results for orders placed or performed during the hospital encounter of 10/13/22 (from the past 48 hour(s))  Glucose, capillary     Status: Abnormal    Collection Time: 10/23/22  1:53 PM  Result Value Ref Range    Glucose-Capillary 120 (H) 70 - 99 mg/dL      Comment: Glucose reference range applies only to samples taken after fasting for at least 8 hours.  Glucose, capillary      Status: Abnormal    Collection Time: 10/23/22  8:24 PM  Result Value Ref Range    Glucose-Capillary 121 (H) 70 - 99 mg/dL      Comment: Glucose reference range applies only to samples taken after fasting for at least 8 hours.  CBC with Differential/Platelet     Status: Abnormal    Collection Time: 10/24/22  4:52 AM  Result Value Ref Range    WBC 8.9 4.0 - 10.5 K/uL    RBC 3.97 3.87 - 5.11 MIL/uL    Hemoglobin 11.8 (L) 12.0 - 15.0 g/dL    HCT 16.1 (L) 09.6 - 46.0 %    MCV 90.4 80.0 - 100.0 fL    MCH 29.7 26.0 - 34.0 pg    MCHC 32.9 30.0 - 36.0 g/dL    RDW 04.5 40.9 - 81.1 %    Platelets 263 150 - 400 K/uL    nRBC 0.0 0.0 - 0.2 %    Neutrophils  Relative % 66 %    Neutro Abs 6.0 1.7 - 7.7 K/uL    Lymphocytes Relative 17 %    Lymphs Abs 1.5 0.7 - 4.0 K/uL    Monocytes Relative 10 %    Monocytes Absolute 0.9 0.1 - 1.0 K/uL    Eosinophils Relative 4 %    Eosinophils Absolute 0.3 0.0 - 0.5 K/uL    Basophils Relative 1 %    Basophils Absolute 0.1 0.0 - 0.1 K/uL    Immature Granulocytes 2 %    Abs Immature Granulocytes 0.18 (H) 0.00 - 0.07 K/uL      Comment: Performed at [facility], [address] [place], [place] [address]  Comprehensive metabolic panel     Status: Abnormal    Collection Time: 10/24/22  4:52 AM  Result Value Ref Range    Sodium 139 135 - 145 mmol/L    Potassium 4.0 3.5 - 5.1 mmol/L    Chloride 105 98 - 111 mmol/L    CO2 21 (L) 22 - 32 mmol/L    Glucose, Bld 134 (H) 70 - 99 mg/dL      Comment: Glucose reference range applies only to samples taken after fasting for at least 8 hours.    BUN 17 8 - 23 mg/dL    Creatinine, Ser 2.44 0.44 - 1.00 mg/dL    Calcium 8.6 (L) 8.9 - 10.3 mg/dL    Total Protein 6.2 (L) 6.5 - 8.1 g/dL    Albumin 2.8 (L) 3.5 - 5.0 g/dL    AST 25 15 - 41 U/L    ALT 26 0 - 44 U/L    Alkaline Phosphatase 84 38 - 126 U/L    Total Bilirubin 0.3 0.3 - 1.2 mg/dL    GFR, Estimated >01 >02 mL/min      Comment: (NOTE) Calculated using the  CKD-EPI Creatinine Equation (2021)      Anion gap 13 5 - 15      Comment: Performed at [facility], [address], [place], [place] [address]  Magnesium     Status: None    Collection Time: 10/24/22  4:52 AM  Result Value Ref Range    Magnesium 2.0 1.7 - 2.4 mg/dL      Comment: Performed at [facility], [address], [place], [place] [address]  Phosphorus     Status: None    Collection Time: 10/24/22  4:52 AM  Result Value Ref Range    Phosphorus 4.0 2.5 - 4.6 mg/dL      Comment: Performed at [facility], [address] [place], [place] [address]  Glucose, capillary     Status: Abnormal    Collection Time: 10/24/22  5:01 AM  Result Value Ref Range    Glucose-Capillary 137 (H) 70 - 99 mg/dL      Comment: Glucose reference range applies only to samples taken after fasting for at least 8 hours.  Glucose, capillary     Status: Abnormal    Collection Time: 10/24/22  7:45 AM  Result Value Ref Range    Glucose-Capillary 129 (H) 70 - 99 mg/dL      Comment: Glucose reference range applies only to samples taken after fasting for at least 8 hours.  Glucose, capillary     Status: Abnormal    Collection Time: 10/24/22 12:07 PM  Result Value Ref Range    Glucose-Capillary 154 (H) 70 - 99 mg/dL      Comment:  Glucose reference range applies only to samples taken after fasting for at least 8 hours.  Glucose, capillary     Status: Abnormal    Collection Time: 10/24/22  5:50 PM  Result Value Ref Range    Glucose-Capillary 161 (H) 70 - 99 mg/dL      Comment: Glucose reference range applies only to samples taken after fasting for at least 8 hours.  Glucose, capillary     Status: Abnormal    Collection Time: 10/24/22  7:59 PM  Result Value Ref Range    Glucose-Capillary 133 (H) 70 - 99 mg/dL      Comment: Glucose reference range applies only to samples taken after fasting for at least 8 hours.  Glucose, capillary     Status: Abnormal    Collection Time: 10/25/22  4:35 AM   Result Value Ref Range    Glucose-Capillary 144 (H) 70 - 99 mg/dL      Comment: Glucose reference range applies only to samples taken after fasting for at least 8 hours.  Glucose, capillary     Status: Abnormal    Collection Time: 10/25/22  8:30 AM  Result Value Ref Range    Glucose-Capillary 124 (H) 70 - 99 mg/dL      Comment: Glucose reference range applies only to samples taken after fasting for at least 8 hours.    Comment 1 Document in Chart         Imaging Results (Last 48 hours)  CT ABDOMEN PELVIS W CONTRAST   Result Date: 10/24/2022 CLINICAL DATA:  Weight loss, unintended. Evaluate anatomy for percutaneous gastrostomy tube placement. History of cecal volvulus. EXAM: CT ABDOMEN AND PELVIS WITH CONTRAST TECHNIQUE: Multidetector CT imaging of the abdomen and pelvis was performed using the standard protocol following bolus administration of intravenous contrast. RADIATION DOSE REDUCTION: This exam was performed according to the departmental dose-optimization program which includes automated exposure control, adjustment of the mA and/or kV according to patient size and/or use of iterative reconstruction technique. CONTRAST:  60mL OMNIPAQUE IOHEXOL 350 MG/ML SOLN COMPARISON:  10/16/2022 FINDINGS: Lower chest: Again noted are areas volume loss at both lung bases. Trace pleural fluid has resolved. Hepatobiliary: Minimal change in the 1.1 cm hypodensity in the right hepatic lobe. Decreased distention of the gallbladder. No significant biliary dilatation. Main portal venous system is patent. Pancreas: Unremarkable. No pancreatic ductal dilatation or surrounding inflammatory changes. Spleen: Normal in size without focal abnormality. Adrenals/Urinary Tract: Adrenal glands are within normal limits. Negative for hydronephrosis. No suspicious renal lesion. Bladder is decompressed with a Foley catheter. Stomach/Bowel: Status post right hemicolectomy. Small bowel is decompressed compared to the prior CT.  There is oral contrast in the stomach, small bowel and large bowel. Nasogastric tube is coiled the gastric cardia region. Contrast and debris within the stomach. No bowel structures between the stomach and the anterior abdominal wall. Difficult to exclude mild wall thickening involving small bowel loops but no evidence for focal inflammation. Evidence for at least 2 duodenal diverticula. Vascular/Lymphatic: Aortic atherosclerosis. No enlarged abdominal or pelvic lymph nodes. Reproductive: Status post hysterectomy. No adnexal masses. Other: Ascites is markedly decreased. There is a small amount of residual fluid situated between the liver and right kidney. No suspicious intra-abdominal fluid collections or abscesses. No evidence for free air. Postoperative changes along the anterior abdominal wall compatible with recent surgery. Irregular fluid collection in the subcutaneous tissues below the skin staples. This subcutaneous collection roughly measures 8.2 x 3.5 x 3.4 cm. Musculoskeletal: No acute bone  abnormality. IMPRESSION: 1. Postoperative changes compatible with a right hemicolectomy. Small bowel distension has resolved. 2. No bowel structures situated between the stomach and the anterior abdominal wall. The anatomy is likely amenable for percutaneous gastrostomy tube placement. 3. Postsurgical changes in the anterior abdominal wall including an irregular subcutaneous fluid collection. Electronically Signed   By: Richarda Overlie M.D.   On: 10/24/2022 17:52           Blood pressure 123/77, pulse 86, temperature 97.6 F (36.4 C), temperature source Oral, resp. rate 20, height 5\' 6"  (1.676 m), weight 70.9 kg, SpO2 97%.   Medical Problem List and Plan: 1. Functional deficits secondary to L PICA/AICA stroke with cerebellar edema             -patient may  shower- if cover incisions             -ELOS/Goals: 10-12 days supervision             Admit to CIR   2.  Antithrombotics: -DVT/anticoagulation:   Pharmaceutical: Heparin             -antiplatelet therapy: Plavix    3. Pain Management: Tylenol as needed             -continue Norco 5/325 one table q HS             -continue Robaxin 500 mg QID   4. Mood/Behavior/Sleep: LCSW to evaluate and provide emotional support; history of bipolar disorder>>             -continue trazodone 50 mg q HS             -continue melatonin 10 mg q HS             -antipsychotic agents: n/a   5. Neuropsych/cognition: This patient is? capable of making decisions on her (since sedated, cannot tell) own behalf.   6. Skin/Wound Care: Routine skin care checks             -continue packing of lower aspect of midline incision BID   7. Fluids/Electrolytes/Nutrition: Routine Is and Os and follow-up chemistries             -continue TF and monitor oral intake             -continue D1 diet with honey thick liquids- just upgraded from puree             -SLP eval             -?? 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 Cliffton Asters   11: Hypothyroidism: continue Synthroid   12: GERD: continue Protonix   13: History of esophageal stricture s/p dil   14: History of sleep apnea: no CPAP   15: Left eyelid palsy/irritation: continue patch             -continue Lacrilube, artificial tears   16: Superficial mid-line incisional infection drained at bedside             -wet to dry dressings BID   17: Chronic back pain (see # 3)             Lynnell Jude  Setzer, PA-C 10/25/2022   I have personally performed a face to face diagnostic evaluation of this patient and formulated the key components of the plan.  Additionally, I have personally reviewed laboratory data, imaging studies, as well as relevant notes and concur with the physician assistant's  documentation above.   The patient's status has not changed from the original H&P.  Any changes in documentation from the acute care chart have been noted above.

## 2022-10-25 NOTE — H&P (Signed)
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 CT scan abdomen and pelvis shows area of narrowing within the ascending colon distal to the cecum as well as an associated area of narrowing in the terminal ileum with focal dilatation of the cecum and proximal ascending colon as well as the small bowel proximal to the terminal ileum these changes are suspicious for internal hernia causing the long segment of narrowing and subsequent small bowel obstruction. General surgery consulted and she was treating conservatively initially with NGT decompression. Her nurse noted her left eye was drooping around 5 PM 9/29 and subsequently later in the evening someone noted her left face was drooping. RR/code stroke initiated and neurology consulted. CT head reveals what appears to be an acute left cerebellar infarct as well as possible involvement in the left medulla suspicious for possible acute posterior inferior cerebellar infarct. CTA of head and neck,CT perfusion negative for LVO, no core or mismatch. Given the moderately large posterior fossa stroke which is in proximity to brainstem, and the extension of the stroke into the brainstem, she was transferred to [facility] medical ICU for immediate in person neurology evaluation and for close monitoring for signs of posterior fossa crowding and potential herniation. She has allergy to aspirin and Plavix held due to need for abdominal surgery. Treated for cerebellar edema with 3% saline. Repeat CT Abd/pelvis 10/01 demonstrated persistent and progressive findings. 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 Dr. Cliffton Asters discussed findings and recommendations for surgical intervention with  patient and family. She underwent laparoscopic converted to open right hemicolectomy. CT repeated 10/02 unchanged cerebellar infarct, no hemorrhage, no hydrocephalus. Started on po liquids 10/03. Left facial droop, left gaze deficit and left ataxia. Cortrak placed on 10/04. Tapered hypertonic saline and started on Plavix. Transferred to hospitalist service. VTE prophy started with heparin. PEG tube discussed with patient and sister and in agreement. Appears that this is on hold until further monitoring of PO intake. She was advanced to D1 Diet with honey thick liquids. Developed draining from inferior aspect of midline incision today and this was drained at the bedside. The patient requires inpatient medicine and rehabilitation evaluations and services for ongoing dysfunction secondary to cerebellar stroke.  Currently, has been medicated for pain and very sleepy. Awakens easily but drifts back to sleep. Husband and daughter at bedside.   Pt snoring and cannot wake up due to recent pain meds.   Review of Systems  Unable to perform ROS: Acuity of condition  Eyes:        Left upper lid does not close and is wearing a patch  Genitourinary:        In dwelling Foley cath in place>>amber colored urine  All other systems reviewed and are negative.  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   BCC (basal cell carcinoma of skin) 06/01/2012   Leg Follows with Dr Margo Aye   Bipolar disorder [facility]) 01/17/2007   Qualifier: Diagnosis of  By:  Everardo All MD, Gregary Signs A    Cataract    Diverticulosis    Diverticulosis    Emphysema of lung (HCC)    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   Gallstones    GERD (gastroesophageal reflux disease)    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   Hashimoto's disease    Hyperlipidemia    Hypertension    Hypothyroidism 08/24/2006   Qualifier: Diagnosis of  By: Everardo All MD, Sean A     IBS (irritable bowel syndrome) 07/27/2016   Obesity 11/01/2013   PUD (peptic ulcer disease)    Sleep apnea 04/27/2016   Tobacco abuse    Past Surgical History:  Procedure Laterality Date   ABDOMINAL HYSTERECTOMY     ABDOMINAL HYSTERECTOMY  01/15/2009   complete   COLONOSCOPY     DILATION AND CURETTAGE OF UTERUS  01/16/1983   EYE SURGERY  03/03/2014   Surgery on both eyes for epiretinal membrane (vitreous peel)   Gated Spect wall motion stress cardiolite  11/05/2001   HIATAL HERNIA REPAIR N/A 07/12/2021   Procedure: LAPAROSCOPY W/ EXTENSIVE FOREGUT DISSECTION; PARTIAL STOMACH REDUCTION; GASTROSTOMY TUBE PLACEMENT; GASTROPEXY;  Surgeon: Luretha Murphy, MD;  Location: [facility];  Service: General;  Laterality: N/A;   LAPAROSCOPIC RIGHT HEMI COLECTOMY Right 10/16/2022   Procedure: LAPAROSCOPIC ASSISTED RIGHT HEMI COLECTOMY;  Surgeon: Andria Meuse, MD;  Location: [facility] OR;  Service: General;  Laterality: Right;   POLYPECTOMY     TUBAL LIGATION  01/15/1993   UTERINE SUSPENSION     mesh   VITRECTOMY Bilateral 03/03/2014   XI ROBOTIC ASSISTED HIATAL HERNIA REPAIR N/A 05/01/2021   Procedure: XI ROBOTIC ASSISTED TYPE III HIATAL HERNIA REPAIR WITH FUNDOPLICATION;  Surgeon: Luretha Murphy, MD;  Location: [facility];  Service: General;  Laterality: N/A;   Family History  Problem Relation Age of Onset   Heart disease Mother 52       stents   Stroke Mother    Hyperlipidemia Mother    Hypertension Mother    Other Mother        blood disorder- mgus   Colon polyps Father    Other Father        aorta disection   Aneurysm Father    Hypertension Sister        ?   Arthritis Sister    Other Sister        thyroid   Other Sister        thyroid   Irritable bowel syndrome Daughter    Other Daughter        gastritis   Mental illness Daughter         bipolar and mood disorder   Mental illness Son        bipolar   Colon cancer Paternal Uncle    Social History:  reports that she quit smoking about 13 years ago. Her smoking use included cigarettes. She started smoking about 43 years ago. She has a 30 pack-year smoking history. She has never used smokeless tobacco. She reports that she does not drink alcohol and does not use drugs. Allergies:  Allergies  Allergen Reactions   Aspirin Shortness Of Breath   Nsaids Shortness Of Breath   Keflex [Cephalexin] Diarrhea   Butamben-Tetracaine-Benzocaine     Mouth sores   Dicyclomine Hcl     REACTION: mouth ulcers   Metronidazole Hives    mouth ulcers   Phenergan [Promethazine Hcl] Other (See Comments)    "knocks" pt out for about 3 days  Quetiapine     Somnolence. Slept for 36 hours straight.   Benay Spice [Lifitegrast]     Burned eyes   Cymbalta [Duloxetine Hcl] Rash   Levothyroxine Palpitations    Pt must use brand SYNTHROID   Moxifloxacin Nausea Only and Palpitations    REACTION: increased heart rate   Moxifloxacin Hcl In Nacl Palpitations   Orphenadrine Citrate Palpitations   Sulfamethoxazole-Trimethoprim Nausea Only and Palpitations    REACTION: increased heart rate, nausea   Medications Prior to Admission  Medication Sig Dispense Refill   calcium carbonate (TUMS) 500 MG chewable tablet Chew 1-2 tablets by mouth as needed for indigestion.     cetirizine (ZYRTEC) 10 MG tablet Take 10 mg by mouth daily as needed for allergies.     Cholecalciferol (D 1000) 25 MCG (1000 UT) capsule Take 1,000 Units by mouth daily.     estradiol (ESTRACE) 0.1 MG/GM vaginal cream Place 1 Applicatorful vaginally 2 (two) times a week.     HYDROcodone-acetaminophen (NORCO) 10-325 MG tablet Take 1 tablet by mouth every 8 (eight) hours as needed.     nitroGLYCERIN (NITROSTAT) 0.4 MG SL tablet Place 0.4 mg under the tongue every 5 (five) minutes as needed for chest pain.     ondansetron (ZOFRAN) 4 MG  tablet Take 1 tablet (4 mg total) by mouth every 8 (eight) hours as needed for nausea or vomiting. Take 1-2 tablets every 4-6 hours as needed for nausea (Patient taking differently: Take 4 mg by mouth every 8 (eight) hours as needed for nausea or vomiting.) 30 tablet 2   raNITIdine HCl (RANITIDINE 75 PO) Take 1 tablet by mouth daily as needed (Hives).     rosuvastatin (CRESTOR) 20 MG tablet Take 1 tablet (20 mg total) by mouth daily. 90 tablet 3   SYNTHROID 88 MCG tablet Take 88 mcg by mouth daily before breakfast.     escitalopram (LEXAPRO) 10 MG tablet Take 10 mg by mouth daily.     latanoprost (XALATAN) 0.005 % ophthalmic solution Place 1 drop into both eyes at bedtime.     lisdexamfetamine (VYVANSE) 70 MG capsule Take 70 mg by mouth daily.     pantoprazole (PROTONIX) 40 MG tablet Take 1 tablet (40 mg total) by mouth daily. 90 tablet 3      Home: Home Living Family/patient expects to be discharged to:: Private residence Living Arrangements: Spouse/significant other Available Help at Discharge: Family, Available 24 hours/day Type of Home: House Home Access: Ramped entrance Entrance Stairs-Number of Steps: 5 Entrance Stairs-Rails: Right, Left Home Layout: One level Bathroom Shower/Tub: Health visitor: Standard (pt does have seat to go over toilet) Bathroom Accessibility: Yes Home Equipment: Agricultural consultant (2 wheels), BSC/3in1, Shower seat, Medical laboratory scientific officer - single point Additional Comments: sister has a WC if needed  Lives With: Spouse, Family (grandson)   Functional History: Prior Function Prior Level of Function : Independent/Modified Independent, Driving Mobility Comments: indep ADLs Comments: indep, works  Functional Status:  Mobility: Bed Mobility Overal bed mobility: Needs Assistance Bed Mobility: Sit to Sidelying, Sidelying to Sit Rolling: Mod assist Sidelying to sit: Min assist, HOB elevated, Used rails Supine to sit: Contact guard Sit to supine: Min  assist Sit to sidelying: Contact guard assist, Used rails General bed mobility comments: Assist to elevate trunk into sitting. Assist for balance when scooting hips to EOB. Transfers Overall transfer level: Needs assistance Equipment used: Rolling walker (2 wheels) Transfers: Sit to/from Stand Sit to Stand: Min assist, From elevated surface Bed to/from chair/wheelchair/BSC  transfer type:: Stand pivot Stand pivot transfers: Mod assist General transfer comment: Assist to power up and stabilize. Multimodal cues for hand placement and technique. Ambulation/Gait Ambulation/Gait assistance: Mod assist Gait Distance (Feet): 6 Feet (3' forward and back and 6' forward and back) Assistive device: Rolling walker (2 wheels) Gait Pattern/deviations: Step-to pattern, Ataxic, Decreased weight shift to right, Decreased stance time - right General Gait Details: Used mirror in front of pt to work on correcting lt lean. Assist for balance and support. Stepping forward pt able to keep feet apart without cross over. Stepping baclwards more difficulty with base of support narrowing. Gait velocity: decr Gait velocity interpretation: <1.31 ft/sec, indicative of household ambulator    ADL: ADL Overall ADL's : Needs assistance/impaired Eating/Feeding: Minimal assistance, Sitting Eating/Feeding Details (indicate cue type and reason): ice chips Grooming: Minimal assistance, Sitting Upper Body Bathing: Moderate assistance, Sitting Lower Body Bathing: Maximal assistance, Sit to/from stand Upper Body Dressing : Moderate assistance, Sitting Lower Body Dressing: Maximal assistance, +2 for physical assistance, +2 for safety/equipment, Sit to/from stand Lower Body Dressing Details (indicate cue type and reason): maximal cues for attention and physical assist Toilet Transfer: Moderate assistance, +2 for physical assistance, +2 for safety/equipment, Ambulation, Regular Toilet, Rolling walker (2 wheels) Toileting-  Clothing Manipulation and Hygiene: Total assistance, +2 for physical assistance, +2 for safety/equipment, Sit to/from stand Functional mobility during ADLs: Moderate assistance, Rolling walker (2 wheels) General ADL Comments: continues to be limited by L hemi weakness, L inattention, impiared cognition. Improved mobility with needed min-modA +2 for transfers and mobiltiy  Cognition: Cognition Overall Cognitive Status: Impaired/Different from baseline Arousal/Alertness: Awake/alert Orientation Level: Oriented X4 Attention: Sustained, Focused Focused Attention: Appears intact Sustained Attention: Impaired Sustained Attention Impairment: Verbal basic Problem Solving: Appears intact (for basic questions) Cognition Arousal: Alert Behavior During Therapy: WFL for tasks assessed/performed Overall Cognitive Status: Impaired/Different from baseline Area of Impairment: Problem solving, Safety/judgement, Following commands, Attention Orientation Level: Time, Disoriented to Current Attention Level: Selective Following Commands: Follows one step commands with increased time Safety/Judgement: Decreased awareness of safety, Decreased awareness of deficits Problem Solving: Slow processing, Requires verbal cues General Comments: able to attend to RN while working with OT at [facility] and return attention to OT  Physical Exam: Blood pressure 123/77, pulse 86, temperature 97.6 F (36.4 C), temperature source Oral, resp. rate 20, height 5\' 6"  (1.676 m), weight 70.9 kg, SpO2 97%. Physical Exam Vitals and nursing note reviewed. Exam conducted with a chaperone present.  Constitutional:      Comments: Pt snoring loudly- supine in bed; has 1L O2 by [place]; L eyepatch in place, NAD Husband at bedside  HENT:     Head: Normocephalic and atraumatic.     Comments: L facial droop L eye will not close- covered with eyepatch Cortrak in place- TF's off    Right Ear: External ear normal.     Left Ear: External ear  normal.     Nose: Nose normal. No congestion.     Mouth/Throat:     Mouth: Mucous membranes are dry.     Pharynx: Oropharynx is clear. No oropharyngeal exudate.     Comments: Snoring with mouth open- is dry Eyes:     Comments: Left lower lid everted and erythematous  Cardiovascular:     Rate and Rhythm: Normal rate and regular rhythm.     Heart sounds: Normal heart sounds. No murmur heard.    No gallop.  Pulmonary:     Effort: Pulmonary effort is normal.  Breath sounds: Normal breath sounds.     Comments: Snoring loudly hard to hear, but doesn't sound like W/R/R Abdominal:     General: Bowel sounds are normal.     Comments: Dressing over midline incision intact- just changed C/D/I Hypoactive BS- mildly hypoactive- mildly TTP diffusely.   Genitourinary:    Comments: Foley- medium amber urine Musculoskeletal:     Cervical back: Neck supple. No tenderness.     Comments: Moving legs both extremely well and RUE- not spontaneously moving LUE much, however is asleep  Skin:    General: Skin is warm and dry.     Comments: IV L AC fossa and R forearm- look OK Sweating on face slightly- appears warm, but doesn't feel feverish  Neurological:     Comments: Somnolent- loudly snoring- cannot get her to wake up for more than ~ 30 seconds.     Psychiatric:     Comments: Too sleepy to assess     Results for orders placed or performed during the hospital encounter of 10/13/22 (from the past 48 hour(s))  Glucose, capillary     Status: Abnormal   Collection Time: 10/23/22  1:53 PM  Result Value Ref Range   Glucose-Capillary 120 (H) 70 - 99 mg/dL    Comment: Glucose reference range applies only to samples taken after fasting for at least 8 hours.  Glucose, capillary     Status: Abnormal   Collection Time: 10/23/22  8:24 PM  Result Value Ref Range   Glucose-Capillary 121 (H) 70 - 99 mg/dL    Comment: Glucose reference range applies only to samples taken after fasting for at least 8 hours.   CBC with Differential/Platelet     Status: Abnormal   Collection Time: 10/24/22  4:52 AM  Result Value Ref Range   WBC 8.9 4.0 - 10.5 K/uL   RBC 3.97 3.87 - 5.11 MIL/uL   Hemoglobin 11.8 (L) 12.0 - 15.0 g/dL   HCT 40.9 (L) 81.1 - 91.4 %   MCV 90.4 80.0 - 100.0 fL   MCH 29.7 26.0 - 34.0 pg   MCHC 32.9 30.0 - 36.0 g/dL   RDW 78.2 95.6 - 21.3 %   Platelets 263 150 - 400 K/uL   nRBC 0.0 0.0 - 0.2 %   Neutrophils Relative % 66 %   Neutro Abs 6.0 1.7 - 7.7 K/uL   Lymphocytes Relative 17 %   Lymphs Abs 1.5 0.7 - 4.0 K/uL   Monocytes Relative 10 %   Monocytes Absolute 0.9 0.1 - 1.0 K/uL   Eosinophils Relative 4 %   Eosinophils Absolute 0.3 0.0 - 0.5 K/uL   Basophils Relative 1 %   Basophils Absolute 0.1 0.0 - 0.1 K/uL   Immature Granulocytes 2 %   Abs Immature Granulocytes 0.18 (H) 0.00 - 0.07 K/uL    Comment: Performed at [facility], [address], [place], [place] [address]  Comprehensive metabolic panel     Status: Abnormal   Collection Time: 10/24/22  4:52 AM  Result Value Ref Range   Sodium 139 135 - 145 mmol/L   Potassium 4.0 3.5 - 5.1 mmol/L   Chloride 105 98 - 111 mmol/L   CO2 21 (L) 22 - 32 mmol/L   Glucose, Bld 134 (H) 70 - 99 mg/dL    Comment: Glucose reference range applies only to samples taken after fasting for at least 8 hours.   BUN 17 8 - 23 mg/dL   Creatinine, Ser 8.46 0.44 -  1.00 mg/dL   Calcium 8.6 (L) 8.9 - 10.3 mg/dL   Total Protein 6.2 (L) 6.5 - 8.1 g/dL   Albumin 2.8 (L) 3.5 - 5.0 g/dL   AST 25 15 - 41 U/L   ALT 26 0 - 44 U/L   Alkaline Phosphatase 84 38 - 126 U/L   Total Bilirubin 0.3 0.3 - 1.2 mg/dL   GFR, Estimated >16 >10 mL/min    Comment: (NOTE) Calculated using the CKD-EPI Creatinine Equation (2021)    Anion gap 13 5 - 15    Comment: Performed at [facility], [address], [place], [place] [address]  Magnesium     Status: None   Collection Time: 10/24/22  4:52 AM  Result Value Ref Range   Magnesium 2.0 1.7 - 2.4 mg/dL     Comment: Performed at [facility], [address], [place], [place] [address]  Phosphorus     Status: None   Collection Time: 10/24/22  4:52 AM  Result Value Ref Range   Phosphorus 4.0 2.5 - 4.6 mg/dL    Comment: Performed at [facility], [address], [place], [place] [address]  Glucose, capillary     Status: Abnormal   Collection Time: 10/24/22  5:01 AM  Result Value Ref Range   Glucose-Capillary 137 (H) 70 - 99 mg/dL    Comment: Glucose reference range applies only to samples taken after fasting for at least 8 hours.  Glucose, capillary     Status: Abnormal   Collection Time: 10/24/22  7:45 AM  Result Value Ref Range   Glucose-Capillary 129 (H) 70 - 99 mg/dL    Comment: Glucose reference range applies only to samples taken after fasting for at least 8 hours.  Glucose, capillary     Status: Abnormal   Collection Time: 10/24/22 12:07 PM  Result Value Ref Range   Glucose-Capillary 154 (H) 70 - 99 mg/dL    Comment: Glucose reference range applies only to samples taken after fasting for at least 8 hours.  Glucose, capillary     Status: Abnormal   Collection Time: 10/24/22  5:50 PM  Result Value Ref Range   Glucose-Capillary 161 (H) 70 - 99 mg/dL    Comment: Glucose reference range applies only to samples taken after fasting for at least 8 hours.  Glucose, capillary     Status: Abnormal   Collection Time: 10/24/22  7:59 PM  Result Value Ref Range   Glucose-Capillary 133 (H) 70 - 99 mg/dL    Comment: Glucose reference range applies only to samples taken after fasting for at least 8 hours.  Glucose, capillary     Status: Abnormal   Collection Time: 10/25/22  4:35 AM  Result Value Ref Range   Glucose-Capillary 144 (H) 70 - 99 mg/dL    Comment: Glucose reference range applies only to samples taken after fasting for at least 8 hours.  Glucose, capillary     Status: Abnormal   Collection Time: 10/25/22  8:30 AM  Result Value Ref Range   Glucose-Capillary 124 (H) 70 - 99  mg/dL    Comment: Glucose reference range applies only to samples taken after fasting for at least 8 hours.   Comment 1 Document in Chart    CT ABDOMEN PELVIS W CONTRAST  Result Date: 10/24/2022 CLINICAL DATA:  Weight loss, unintended. Evaluate anatomy for percutaneous gastrostomy tube placement. History of cecal volvulus. EXAM: CT ABDOMEN AND PELVIS WITH CONTRAST TECHNIQUE: Multidetector CT imaging of  the abdomen and pelvis was performed using the standard protocol following bolus administration of intravenous contrast. RADIATION DOSE REDUCTION: This exam was performed according to the departmental dose-optimization program which includes automated exposure control, adjustment of the mA and/or kV according to patient size and/or use of iterative reconstruction technique. CONTRAST:  60mL OMNIPAQUE IOHEXOL 350 MG/ML SOLN COMPARISON:  10/16/2022 FINDINGS: Lower chest: Again noted are areas volume loss at both lung bases. Trace pleural fluid has resolved. Hepatobiliary: Minimal change in the 1.1 cm hypodensity in the right hepatic lobe. Decreased distention of the gallbladder. No significant biliary dilatation. Main portal venous system is patent. Pancreas: Unremarkable. No pancreatic ductal dilatation or surrounding inflammatory changes. Spleen: Normal in size without focal abnormality. Adrenals/Urinary Tract: Adrenal glands are within normal limits. Negative for hydronephrosis. No suspicious renal lesion. Bladder is decompressed with a Foley catheter. Stomach/Bowel: Status post right hemicolectomy. Small bowel is decompressed compared to the prior CT. There is oral contrast in the stomach, small bowel and large bowel. Nasogastric tube is coiled the gastric cardia region. Contrast and debris within the stomach. No bowel structures between the stomach and the anterior abdominal wall. Difficult to exclude mild wall thickening involving small bowel loops but no evidence for focal inflammation. Evidence for at least  2 duodenal diverticula. Vascular/Lymphatic: Aortic atherosclerosis. No enlarged abdominal or pelvic lymph nodes. Reproductive: Status post hysterectomy. No adnexal masses. Other: Ascites is markedly decreased. There is a small amount of residual fluid situated between the liver and right kidney. No suspicious intra-abdominal fluid collections or abscesses. No evidence for free air. Postoperative changes along the anterior abdominal wall compatible with recent surgery. Irregular fluid collection in the subcutaneous tissues below the skin staples. This subcutaneous collection roughly measures 8.2 x 3.5 x 3.4 cm. Musculoskeletal: No acute bone abnormality. IMPRESSION: 1. Postoperative changes compatible with a right hemicolectomy. Small bowel distension has resolved. 2. No bowel structures situated between the stomach and the anterior abdominal wall. The anatomy is likely amenable for percutaneous gastrostomy tube placement. 3. Postsurgical changes in the anterior abdominal wall including an irregular subcutaneous fluid collection. Electronically Signed   By: Richarda Overlie M.D.   On: 10/24/2022 17:52      Blood pressure 123/77, pulse 86, temperature 97.6 F (36.4 C), temperature source Oral, resp. rate 20, height 5\' 6"  (1.676 m), weight 70.9 kg, SpO2 97%.  Medical Problem List and Plan: 1. Functional deficits secondary to L PICA/AICA stroke with cerebellar edema  -patient may  shower- if cover incisions  -ELOS/Goals: 10-12 days supervision  Admit to CIR  2.  Antithrombotics: -DVT/anticoagulation:  Pharmaceutical: Heparin  -antiplatelet therapy: Plavix   3. Pain Management: Tylenol as needed  -continue Norco 5/325 one table q HS  -continue Robaxin 500 mg QID  4. Mood/Behavior/Sleep: LCSW to evaluate and provide emotional support; history of bipolar disorder>>  -continue trazodone 50 mg q HS  -continue melatonin 10 mg q HS  -antipsychotic agents: n/a  5. Neuropsych/cognition: This patient is?  capable of making decisions on her (since sedated, cannot tell) own behalf.  6. Skin/Wound Care: Routine skin care checks  -continue packing of lower aspect of midline incision BID   7. Fluids/Electrolytes/Nutrition: Routine Is and Os and follow-up chemistries  -continue TF and monitor oral intake  -continue D1 diet with honey thick liquids- just upgraded from puree  -SLP eval  -?? 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 Cliffton Asters  11: Hypothyroidism: continue Synthroid  12: GERD: continue Protonix  13: History of esophageal stricture s/p dil  14: History of sleep apnea: no CPAP  15: Left eyelid palsy/irritation: continue patch  -continue Lacrilube, artificial tears  16: Superficial mid-line incisional infection drained at bedside  -wet to dry dressings BID  17: Chronic back pain (see # 3)       Milinda Antis, PA-C 10/25/2022  I have personally performed a face to face diagnostic evaluation of this patient and formulated the key components of the plan.  Additionally, I have personally reviewed laboratory data, imaging studies, as well as relevant notes and concur with the physician assistant's documentation above.   The patient's status has not changed from the original H&P.  Any changes in documentation from the acute care chart have been noted above.

## 2022-10-25 NOTE — Plan of Care (Signed)
Problem: Education: Goal: Knowledge of General Education information will improve Description: Including pain rating scale, medication(s)/side effects and non-pharmacologic comfort measures Outcome: Progressing   Problem: Health Behavior/Discharge Planning: Goal: Ability to manage health-related needs will improve Outcome: Progressing   Problem: Clinical Measurements: Goal: Ability to maintain clinical measurements within normal limits will improve Outcome: Progressing Goal: Will remain free from infection Outcome: Progressing Goal: Diagnostic test results will improve Outcome: Progressing Goal: Respiratory complications will improve Outcome: Progressing Goal: Cardiovascular complication will be avoided Outcome: Progressing   Problem: Activity: Goal: Risk for activity intolerance will decrease Outcome: Progressing   Problem: Nutrition: Goal: Adequate nutrition will be maintained Outcome: Progressing   Problem: Coping: Goal: Level of anxiety will decrease Outcome: Progressing   Problem: Elimination: Goal: Will not experience complications related to bowel motility Outcome: Progressing Goal: Will not experience complications related to urinary retention Outcome: Progressing   Problem: Pain Managment: Goal: General experience of comfort will improve Outcome: Progressing   Problem: Safety: Goal: Ability to remain free from injury will improve Outcome: Progressing   Problem: Skin Integrity: Goal: Risk for impaired skin integrity will decrease Outcome: Progressing   Problem: Education: Goal: Knowledge of General Education information will improve Description: Including pain rating scale, medication(s)/side effects and non-pharmacologic comfort measures Outcome: Progressing   Problem: Health Behavior/Discharge Planning: Goal: Ability to manage health-related needs will improve Outcome: Progressing   Problem: Clinical Measurements: Goal: Ability to maintain  clinical measurements within normal limits will improve Outcome: Progressing Goal: Will remain free from infection Outcome: Progressing Goal: Diagnostic test results will improve Outcome: Progressing Goal: Respiratory complications will improve Outcome: Progressing Goal: Cardiovascular complication will be avoided Outcome: Progressing   Problem: Activity: Goal: Risk for activity intolerance will decrease Outcome: Progressing   Problem: Nutrition: Goal: Adequate nutrition will be maintained Outcome: Progressing   Problem: Coping: Goal: Level of anxiety will decrease Outcome: Progressing   Problem: Elimination: Goal: Will not experience complications related to bowel motility Outcome: Progressing Goal: Will not experience complications related to urinary retention Outcome: Progressing   Problem: Pain Managment: Goal: General experience of comfort will improve Outcome: Progressing   Problem: Safety: Goal: Ability to remain free from injury will improve Outcome: Progressing   Problem: Skin Integrity: Goal: Risk for impaired skin integrity will decrease Outcome: Progressing   Problem: Education: Goal: Knowledge of disease or condition will improve Outcome: Progressing Goal: Knowledge of secondary prevention will improve (MUST DOCUMENT ALL) Outcome: Progressing Goal: Knowledge of patient specific risk factors will improve Loraine Leriche N/A or DELETE if not current risk factor) Outcome: Progressing   Problem: Ischemic Stroke/TIA Tissue Perfusion: Goal: Complications of ischemic stroke/TIA will be minimized Outcome: Progressing   Problem: Coping: Goal: Will verbalize positive feelings about self Outcome: Progressing Goal: Will identify appropriate support needs Outcome: Progressing   Problem: Health Behavior/Discharge Planning: Goal: Ability to manage health-related needs will improve Outcome: Progressing Goal: Goals will be collaboratively established with  patient/family Outcome: Progressing   Problem: Self-Care: Goal: Ability to participate in self-care as condition permits will improve Outcome: Progressing Goal: Verbalization of feelings and concerns over difficulty with self-care will improve Outcome: Progressing Goal: Ability to communicate needs accurately will improve Outcome: Progressing   Problem: Nutrition: Goal: Risk of aspiration will decrease Outcome: Progressing Goal: Dietary intake will improve Outcome: Progressing   Problem: Education: Goal: Knowledge of disease or condition will improve Outcome: Progressing Goal: Knowledge of secondary prevention will improve (MUST DOCUMENT ALL) Outcome: Progressing Goal: Knowledge of patient specific risk factors will improve Loraine Leriche  N/A or DELETE if not current risk factor) Outcome: Progressing

## 2022-10-25 NOTE — Progress Notes (Signed)
PT Cancellation Note  Patient Details Name: Joan Robinson MRN: [id number] DOB: Jul 05, 1960   Cancelled Treatment:    Reason Eval/Treat Not Completed: (P) Fatigue/lethargy limiting ability to participate, pt sleeping on arrival, spouse at bedside stating pt did not sleep well overnight and recently received pain medication, spouse politely requesting to allow pt to sleep. Will check back as schedule allows to continue with PT POC.  Lenora Boys. PTA Acute Rehabilitation Services Office: [phone]    Catalina Antigua 10/25/2022, 2:56 PM

## 2022-10-25 NOTE — Progress Notes (Signed)
Inpatient Rehabilitation Admission Medication Review by a Pharmacist  A complete drug regimen review was completed for this patient to identify any potential clinically significant medication issues.  High Risk Drug Classes Is patient taking? Indication by Medication  Antipsychotic No   Anticoagulant Yes Heparin SQ VTE ppx  Antibiotic No   Opioid Yes Hydrocodone/APAP for pain  Antiplatelet Yes Plavix, CVA  Hypoglycemics/insulin No   Vasoactive Medication No   Chemotherapy No   Other Yes APAP prn pain Synthroid - hypothyroidism Melatonin, Trazodone - sleep Methocarbamol - muscle relaxant Ondansetron - nausea Pantoprazole - GERD Polycarbophil - bowel regimen Rosuvastatin - HLD     Type of Medication Issue Identified Description of Issue Recommendation(s)  Drug Interaction(s) (clinically significant)     Duplicate Therapy     Allergy     No Medication Administration End Date     Incorrect Dose     Additional Drug Therapy Needed     Significant med changes from prior encounter (inform family/care partners about these prior to discharge). Calcium Carbonate 500mg  1-2 tabs prn indigestion Cetirizine 10mg  daily prn allergies Vitamin D 1000 units daily Escitalopram 10mg  daily Estrace vaginal cream 2xweek Lisdexamfetamine 70mg  daily Escitalopram, Estrace vaginal cream, Lisdexamfetamine discontinued after inpatient admission.  Other meds are prn.  Other       Clinically significant medication issues were identified that warrant physician communication and completion of prescribed/recommended actions by midnight of the next day:  No  Name of provider notified for urgent issues identified:   Provider Method of Notification:     Pharmacist comments:   Time spent performing this drug regimen review (minutes):  20     Joan Robinson, Joan Robinson 10/25/2022 6:11 PM

## 2022-10-25 NOTE — Plan of Care (Signed)

## 2022-10-25 NOTE — TOC Transition Note (Signed)
Transition of Care [facility]) - CM/SW Discharge Note   Patient Details  Name: Joan Robinson MRN: [id number] Date of Birth: 1960-05-19  [facility]) CM/SW Contact:  Janae Bridgeman, RN Phone Number: 10/25/2022, 12:52 PM   Clinical Narrative:    CM noted that CIR has received insurance authorization and patient is planned to discharge to the facility today.  Bedside nursing will call report when bed available.         Patient Goals and CMS Choice      Discharge Placement                         Discharge Plan and Services Additional resources added to the After Visit Summary for                                       Social Determinants of Health (SDOH) Interventions SDOH Screenings   Food Insecurity: No Food Insecurity (10/14/2022)  Housing: Low Risk  (10/14/2022)  Transportation Needs: No Transportation Needs (10/14/2022)  Utilities: Not At Risk (10/14/2022)  Financial Resource Strain: Low Risk  (05/29/2022)   Received from [facility], [facility]  Physical Activity: Sufficiently Active (05/29/2022)   Received from [facility], [facility]  Social Connections: Moderately Integrated (05/29/2022)   Received from [facility], [facility]  Stress: Stress Concern Present (05/29/2022)   Received from [facility], [facility]  Tobacco Use: Medium Risk (10/14/2022)     Readmission Risk Interventions    10/16/2022    9:11 AM 07/17/2021    1:09 PM  Readmission Risk Prevention Plan  Post Dischage Appt Complete   Medication Screening Complete   Transportation Screening Complete Complete  PCP or Specialist Appt within 5-7 Days  Complete  Home Care Screening  Complete  Medication Review (RN CM)  Complete

## 2022-10-26 DIAGNOSIS — I639 Cerebral infarction, unspecified: Secondary | ICD-10-CM | POA: Diagnosis not present

## 2022-10-26 LAB — CBC WITH DIFFERENTIAL/PLATELET
Abs Immature Granulocytes: 0.22 10*3/uL — ABNORMAL HIGH (ref 0.00–0.07)
Basophils Absolute: 0 10*3/uL (ref 0.0–0.1)
Basophils Relative: 0 %
Eosinophils Absolute: 0.2 10*3/uL (ref 0.0–0.5)
Eosinophils Relative: 2 %
HCT: 37.2 % (ref 36.0–46.0)
Hemoglobin: 12.4 g/dL (ref 12.0–15.0)
Immature Granulocytes: 2 %
Lymphocytes Relative: 14 %
Lymphs Abs: 1.7 10*3/uL (ref 0.7–4.0)
MCH: 29.8 pg (ref 26.0–34.0)
MCHC: 33.3 g/dL (ref 30.0–36.0)
MCV: 89.4 fL (ref 80.0–100.0)
Monocytes Absolute: 0.8 10*3/uL (ref 0.1–1.0)
Monocytes Relative: 7 %
Neutro Abs: 8.8 10*3/uL — ABNORMAL HIGH (ref 1.7–7.7)
Neutrophils Relative %: 75 %
Platelets: 396 10*3/uL (ref 150–400)
RBC: 4.16 MIL/uL (ref 3.87–5.11)
RDW: 13.5 % (ref 11.5–15.5)
WBC: 11.8 10*3/uL — ABNORMAL HIGH (ref 4.0–10.5)
nRBC: 0 % (ref 0.0–0.2)

## 2022-10-26 LAB — COMPREHENSIVE METABOLIC PANEL
ALT: 31 U/L (ref 0–44)
AST: 39 U/L (ref 15–41)
Albumin: 3.1 g/dL — ABNORMAL LOW (ref 3.5–5.0)
Alkaline Phosphatase: 110 U/L (ref 38–126)
Anion gap: 11 (ref 5–15)
BUN: 22 mg/dL (ref 8–23)
CO2: 23 mmol/L (ref 22–32)
Calcium: 9.2 mg/dL (ref 8.9–10.3)
Chloride: 99 mmol/L (ref 98–111)
Creatinine, Ser: 0.74 mg/dL (ref 0.44–1.00)
GFR, Estimated: >60 mL/min (ref >60)
Glucose, Bld: 137 mg/dL — ABNORMAL HIGH (ref 70–99)
Potassium: 5.1 mmol/L (ref 3.5–5.1)
Sodium: 133 mmol/L — ABNORMAL LOW (ref 135–145)
Total Bilirubin: 1.1 mg/dL (ref 0.3–1.2)
Total Protein: 7 g/dL (ref 6.5–8.1)

## 2022-10-26 LAB — GLUCOSE, CAPILLARY
Glucose-Capillary: 117 mg/dL — ABNORMAL HIGH (ref 70–99)
Glucose-Capillary: 121 mg/dL — ABNORMAL HIGH (ref 70–99)
Glucose-Capillary: 148 mg/dL — ABNORMAL HIGH (ref 70–99)
Glucose-Capillary: 164 mg/dL — ABNORMAL HIGH (ref 70–99)
Glucose-Capillary: 175 mg/dL — ABNORMAL HIGH (ref 70–99)

## 2022-10-26 MED ORDER — BUSPIRONE HCL 10 MG PO TABS
5.0000 mg | ORAL_TABLET | Freq: Three times a day (TID) | ORAL | Status: DC | PRN
  Administered 2022-10-26: 5 mg via ORAL
  Filled 2022-10-26: qty 1

## 2022-10-26 MED ORDER — ESCITALOPRAM OXALATE 10 MG PO TABS
10.0000 mg | ORAL_TABLET | Freq: Every day | ORAL | Status: DC
  Administered 2022-10-27 – 2022-11-13 (×18): 10 mg via ORAL
  Filled 2022-10-26 (×18): qty 1

## 2022-10-26 MED ORDER — HEPARIN SODIUM (PORCINE) 5000 UNIT/ML IJ SOLN
5000.0000 [IU] | Freq: Three times a day (TID) | INTRAMUSCULAR | Status: DC
  Administered 2022-10-27 – 2022-11-13 (×51): 5000 [IU] via SUBCUTANEOUS
  Filled 2022-10-26 (×47): qty 1

## 2022-10-26 MED ORDER — OSMOLITE 1.5 CAL PO LIQD: 1000.0000 mL | ORAL | Status: DC

## 2022-10-26 MED ORDER — TRAZODONE HCL 50 MG PO TABS: 50.0000 mg | ORAL_TABLET | Freq: Every day | ORAL | Status: DC

## 2022-10-26 MED ORDER — TRAZODONE HCL 50 MG PO TABS
50.0000 mg | ORAL_TABLET | Freq: Every day | ORAL | Status: DC
  Administered 2022-10-26 – 2022-11-08 (×14): 50 mg via ORAL
  Filled 2022-10-26 (×14): qty 1

## 2022-10-26 MED ORDER — OSMOLITE 1.5 CAL PO LIQD
1000.0000 mL | ORAL | Status: DC
  Administered 2022-10-26 – 2022-10-28 (×3): 1000 mL
  Filled 2022-10-26 (×3): qty 1000

## 2022-10-26 MED ORDER — SCOPOLAMINE 1 MG/3DAYS TD PT72
1.0000 | MEDICATED_PATCH | TRANSDERMAL | Status: DC
  Administered 2022-10-26: 1.5 mg via TRANSDERMAL
  Filled 2022-10-26: qty 1

## 2022-10-26 NOTE — Progress Notes (Signed)
Patient ID: Joan Robinson, female   DOB: 1960/02/21, 62 y.o.   MRN: [id number] [facility] Surgery Progress Note     Subjective: Daughter at bedside. Reports patient tired this AM and ill tempered. Tolerating TF and having bowel function   Objective: Vital signs in last 24 hours: Temp:  [97.4 F (36.3 C)-98 F (36.7 C)] 98 F (36.7 C) (10/11 0404) Pulse Rate:  [81-86] 85 (10/11 0404) Resp:  [16-19] 17 (10/11 0404) BP: (115-141)/(74-85) 115/83 (10/11 0404) SpO2:  [95 %-98 %] 95 % (10/11 0404) Weight:  [68.1 kg] 68.1 kg (10/10 1821) Last BM Date : 10/23/22  Intake/Output from previous day: 10/10 0701 - 10/11 0700 In: -  Out: 1250 [Urine:1050; Drains:200] Intake/Output this shift: No intake/output data recorded.  PE: General: Alert, NAD Abd: Soft, ND, NT, +BS. Midline wound open inferiorly with scant drainage, erythema significantly improved, superior staples C/D/I  Lab Results:  Recent Labs    10/24/22 0452 10/26/22 1025  WBC 8.9 11.8*  HGB 11.8* 12.4  HCT 35.9* 37.2  PLT 263 396   BMET Recent Labs    10/24/22 0452 10/26/22 1025  NA 139 133*  K 4.0 5.1  CL 105 99  CO2 21* 23  GLUCOSE 134* 137*  BUN 17 22  CREATININE 0.77 0.74  CALCIUM 8.6* 9.2   PT/INR No results for input(s): "LABPROT", "INR" in the last 72 hours. CMP     Component Value Date/Time   NA 133 (L) 10/26/2022 1025   K 5.1 10/26/2022 1025   CL 99 10/26/2022 1025   CO2 23 10/26/2022 1025   GLUCOSE 137 (H) 10/26/2022 1025   BUN 22 10/26/2022 1025   CREATININE 0.74 10/26/2022 1025   CREATININE 0.91 09/02/2015 1352   CALCIUM 9.2 10/26/2022 1025   PROT 7.0 10/26/2022 1025   ALBUMIN 3.1 (L) 10/26/2022 1025   AST 39 10/26/2022 1025   ALT 31 10/26/2022 1025   ALKPHOS 110 10/26/2022 1025   BILITOT 1.1 10/26/2022 1025   GFRNONAA >60 10/26/2022 1025   GFRAA  12/16/2009 2144    >60        The eGFR has been calculated using the MDRD equation. This calculation has not been validated in  all clinical situations. eGFR's persistently <60 mL/min signify possible Chronic Kidney Disease.   Lipase     Component Value Date/Time   LIPASE 10 (L) 10/13/2022 2116       Studies/Results: No results found.  Anti-infectives: Anti-infectives (From admission, onward)    None      FINAL MICROSCOPIC DIAGNOSIS:   A. COLON, RIGHT, RESECTION:       Segment of colon with terminal ileum with submucosal congestion and  serosal mesothelial reaction.       Vermiform appendix without significant diagnostic alteration.       Four lymph nodes, negative for metastatic carcinoma (0/4).       Negative for dysplasia or malignancy.   B. ADDITIONAL ILEUM, RESECTION:       Segment of small intestine without significant diagnostic  alteration.      Focal mesenteric hemorrhage.       Negative for dysplasia or malignancy.    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 Cliffton Asters - Path benign as above - D1 diet per SLP. Continue TF@ goal until taking in enough PO then ok to wean off - would recommend holding off on PEG at the moment and allowing patient to continue to work  with SLP - if not improving then reasonable to consider prior to DC - Mobilize, therapies - CIR pending  - removed staples from inferior incision yesterday - looks significantly improved - BID wet to dry dressing to open portion of wound    Acute stroke - Per Neuro. Started on Plavix 10/6. Hgb 12.4  FEN: D1 diet per SLP, TF's at goal.  VTE: sq heparin. Plavix ID: Cefotetan 10/1>10/2. None currently. Aferbile. WBC wnl  Foley: Failed TOV, replaced 10/4. Good UOP.     LOS: 1 day    Juliet Rude, [facility] Surgery 10/26/2022, 11:54 AM Please see Amion for pager number during day hours 7:00am-4:30pm

## 2022-10-26 NOTE — Discharge Summary (Signed)
Physician Discharge Summary  Patient ID: Joan Robinson MRN: [id number] DOB/AGE: [birth year] 62 y.o.  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   RADIATION DOSE REDUCTION: This exam was performed according to the departmental dose-optimization program which includes automated exposure control, adjustment of the mA and/or kV according to patient size and/or use of iterative reconstruction technique.   COMPARISON:  Brain MRI 10/15/2022 and head CT 10/19/2022   FINDINGS: Brain: Decreased mass effect within the posterior fossa improved patency of the fourth ventricle. No hydrocephalus. Resolving hypoattenuation within the left cerebellar hemisphere. No hemorrhage or extra-axial collection. Unremarkable appearance of the supratentorial brain.   Vascular: No hyperdense vessel or unexpected vascular calcification.   Skull: The visualized skull base, calvarium and extracranial soft tissues are normal.   Sinuses/Orbits: Partial opacification of the left mastoid. Paranasal sinuses are clear. Normal orbits.   IMPRESSION: Decreased mass effect within the posterior fossa with improved patency of the fourth ventricle. No hydrocephalus.     Electronically Signed   By: Joan Robinson M.D.   On: 10/28/2022 19:17      Result History     Labs:  Basic Metabolic Panel: Recent Labs  Lab  10/19/22 1247 10/19/22 1949 10/20/22 0241 10/20/22 4098 10/20/22 1247 10/20/22 1833 10/21/22 0147 10/21/22 0757 10/22/22 0914 10/23/22 0542 10/24/22 0452  NA 148* 149* 148* 150* 148* 147* 146* 143 140 138 139  K  --   --   --  3.3*  --   --   --   --  3.5 3.6 4.0  CL  --   --   --  112*  --   --   --   --  102 102 105  CO2  --   --   --  25  --   --   --   --  24 25 21*  GLUCOSE  --   --   --  130*  --   --   --   --  116* 116* 134*  BUN  --   --   --  14  --   --   --   --  12 11 17   CREATININE  --   --   --  0.78  --   --   --   --  0.70 0.61 0.77  CALCIUM  --   --   --  8.0*  --   --   --   --  8.7* 8.6* 8.6*  MG  --   --   --   --   --   --   --   --  1.9 2.0 2.0  PHOS  --   --   --   --   --   --   --   --  3.2 3.9 4.0    CBC: Recent Labs  Lab 10/22/22 0914 10/23/22 0542 10/24/22 0452  WBC 5.7 7.6 8.9  NEUTROABS 3.7 4.9 6.0  HGB 12.9 11.6* 11.8*  HCT  38.4 35.5* 35.9*  MCV 88.5 87.7 90.4  PLT 193 207 263    CBG: Recent Labs  Lab 10/25/22 1644 10/25/22 2044 10/26/22 0006 10/26/22 0402 10/26/22 0808  GLUCAP 114* 142* 121* 148* 175*    Brief HPI:   Joan Robinson is a 62 y.o. female who presented to the [facility] ED on 10/13/2022 with complaints of abdominal pain times 2 days. CT scan abdomen and pelvis shows area of narrowing within the ascending colon distal to the cecum as well as an associated area of narrowing in the terminal ileum with focal dilatation of the cecum and proximal ascending colon as well as the small bowel proximal to the terminal ileum these changes are suspicious for internal hernia causing the long segment of narrowing and subsequent small bowel obstruction. General surgery consulted and she was treating conservatively initially with NGT decompression. Her nurse noted her left eye was drooping around 5 PM 9/29 and subsequently later in the evening someone noted her left face was drooping. RR/code stroke initiated and neurology consulted. CT  head reveals what appears to be an acute left cerebellar infarct as well as possible involvement in the left medulla suspicious for possible acute posterior inferior cerebellar infarct. CTA of head and neck,CT perfusion negative for LVO, no core or mismatch. Given the moderately large posterior fossa stroke which is in proximity to brainstem, and the extension of the stroke into the brainstem, she was transferred to [facility] medical ICU for immediate in person neurology evaluation and for close monitoring for signs of posterior fossa crowding and potential herniation. She has allergy to aspirin and Plavix held due to need for abdominal surgery. Treated for cerebellar edema with 3% saline. Repeat CT Abd/pelvis 10/01 demonstrated persistent and progressive findings. 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 Dr. Cliffton Asters discussed findings and recommendations for surgical intervention with patient and family. She underwent laparoscopic converted to open right hemicolectomy. CT repeated 10/02 unchanged cerebellar infarct, no hemorrhage, no hydrocephalus. Started on po liquids 10/03. Left facial droop, left gaze deficit and left ataxia. Cortrak placed on 10/04. Tapered hypertonic saline and started on Plavix. Transferred to hospitalist service. VTE prophy started with heparin. PEG tube discussed with patient and sister and in agreement. Appears that this is on hold until further monitoring of PO intake. She was advanced to D1 Diet with honey thick liquids. Developed draining from inferior aspect of midline incision today and this was drained at the bedside.    Hospital Course: Joan Robinson was admitted to rehab 10/25/2022 for inpatient therapies to consist of PT, ST and OT at least three hours five days a week. Past admission physiatrist, therapy team and rehab RN have worked together to provide customized collaborative inpatient rehab. Lexapro restarted and Buspar discontinued as family member reported  sensitivity to medication. Scopolamine patch for nausea and vertigo. Foley cath removed 10/12, PVRs ordered. Meclizine added on 10/13. Stat CT head obtained on 10/13 due to change in right pupil size. Improvement in mass effect. Seen in follow-up by GS on 10/14. Significant improvement in midline incision. UTI started on Bactrim. Consulted Dr. Allena Katz ophthalmologist on-call 10/15. Also reached out to Dr. Jackquline Bosch patient's outpatient ophthalmologist in regards to left eye. Vestibular evaluation today: Vertebro-basilar artery insufficiency (VBI) Assessment  Right: horizontal nystagmus Left: up beating torsional nystagmus  Findings: Pt with positive VBI bilaterally with nystagmus noted in bilateral directions, deferred further central and peripheral vestibular testing for safety.    Blood pressures were monitored on TID basis  and remained stable.   Rehab course: During patient's stay in rehab weekly team conferences were held to monitor patient's progress, set goals and discuss barriers to discharge. At admission, patient required mod with mobility, mod-total assist with ADLs.   She has had improvement in activity tolerance, balance, postural control as well as ability to compensate for deficits. She has had improvement in functional use RUE/LUE  and RLE/LLE as well as improvement in awareness       Disposition:  There are no questions and answers to display.         Diet:  Special Instructions:   Allergies as of 10/26/2022       Reactions   Aspirin Shortness Of Breath   Nsaids Shortness Of Breath   Keflex [cephalexin] Diarrhea   Butamben-tetracaine-benzocaine    Mouth sores   Dicyclomine Hcl    REACTION: mouth ulcers   Metronidazole Hives   mouth ulcers   Phenergan [promethazine Hcl] Other (See Comments)   "knocks" pt out for about 3 days   Quetiapine    Somnolence. Slept for 36 hours straight.   Benay Spice [lifitegrast]    Burned eyes   Cymbalta [duloxetine Hcl] Rash    Levothyroxine Palpitations   Pt must use brand SYNTHROID   Moxifloxacin Nausea Only, Palpitations   REACTION: increased heart rate   Moxifloxacin Hcl In Nacl Palpitations   Orphenadrine Citrate Palpitations   Sulfamethoxazole-trimethoprim Nausea Only, Palpitations   REACTION: increased heart rate, nausea     Med Rec must be completed prior to using this SMARTLINK***        Signed: Milinda Antis 10/26/2022, 8:33 AM

## 2022-10-26 NOTE — IPOC Note (Addendum)
Overall Plan of Care [facility]) Patient Details Name: Joan Robinson MRN: [id number] DOB: 09/30/1960  Admitting Diagnosis: CVA (cerebral vascular accident) [facility])  Hospital Problems: Principal Problem:   CVA (cerebral vascular accident) (HCC)     Functional Problem List: Nursing Bladder, Safety, Bowel, Endurance, Medication Management, Nutrition  PT Balance, Endurance, Motor, Perception, Safety, Sensory, Skin Integrity, Nutrition  OT Balance, Endurance, Perception, Vision, Behavior, Motor, Safety, Cognition, Nutrition, Sensory, Pain  SLP Cognition, Endurance, Motor, Nutrition, Safety  TR         Basic ADL's: OT Eating, Grooming, Bathing, Dressing, Toileting     Advanced  ADL's: OT       Transfers: PT Bed Mobility, Bed to Chair, Customer service manager, Tub/Shower     Locomotion: PT Ambulation, Stairs     Additional Impairments: OT Fuctional Use of Upper Extremity  SLP Swallowing, Social Cognition   Problem Solving, Memory, Attention, Awareness  TR      Anticipated Outcomes Item Anticipated Outcome  Self Feeding Supervivion  Swallowing  minA   Basic self-care  Min assist  Toileting  min assist   Bathroom Transfers min assist  Bowel/Bladder  manage bowel w mod I and bladder w toileting  Transfers  Supervision  Locomotion  CGA  Communication  minA  Cognition  minA  Pain  manage < 4 with prns  Safety/Judgment  manage w cues   Therapy Plan: PT Intensity: Minimum of 1-2 x/day ,45 to 90 minutes PT Frequency: 5 out of 7 days PT Duration Estimated Length of Stay: 2-3 weeks OT Intensity: Minimum of 1-2 x/day, 45 to 90 minutes OT Frequency: 5 out of 7 days OT Duration/Estimated Length of Stay: 18-21 days SLP Intensity: Minumum of 1-2 x/day, 30 to 90 minutes SLP Frequency: 3 to 5 out of 7 days SLP Duration/Estimated Length of Stay: 2.5-3 weeks   Team Interventions: Nursing Interventions Bladder Management, Medication Management, Discharge Planning, Bowel  Management, Disease Management/Prevention, Skin Care/Wound Management, Patient/Family Education, Pain Management, Dysphagia/Aspiration Precaution Training  PT interventions Ambulation/gait training, Cognitive remediation/compensation, Discharge planning, DME/adaptive equipment instruction, Functional mobility training, Pain management, Psychosocial support, Splinting/orthotics, Therapeutic Activities, UE/LE Strength taining/ROM, Visual/perceptual remediation/compensation, Wheelchair propulsion/positioning, UE/LE Coordination activities, Therapeutic Exercise, Stair training, Skin care/wound management, Patient/family education, Neuromuscular re-education, Functional electrical stimulation, Disease management/prevention, Warden/ranger, Community reintegration  OT Interventions Warden/ranger, Disease mangement/prevention, Functional electrical stimulation, Neuromuscular re-education, Patient/family education, Self Care/advanced ADL retraining, Splinting/orthotics, Therapeutic Exercise, UE/LE Coordination activities, Wheelchair propulsion/positioning, Visual/perceptual remediation/compensation, UE/LE Strength taining/ROM, Therapeutic Activities, Skin care/wound managment, Psychosocial support, Pain management, Functional mobility training, DME/adaptive equipment instruction, Discharge planning, Cognitive remediation/compensation  SLP Interventions Cognitive remediation/compensation, Environmental controls, Multimodal communication approach, Cueing hierarchy, Functional tasks, Therapeutic Activities, Internal/external aids, Therapeutic Exercise, Oral motor exercises, Dysphagia/aspiration precaution training, Patient/family education  TR Interventions    SW/CM Interventions Discharge Planning, Psychosocial Support, Patient/Family Education   Barriers to Discharge MD  Medical stability\  Nursing Decreased caregiver support, Home environment access/layout, Wound Care 1 level ramp/5ste  bil rails w spouse; dtr to assist  PT Decreased caregiver support, Lack of/limited family support, Insurance for SNF coverage, New oxygen, Nutrition means    OT      SLP      SW Insurance for SNF coverage     Team Discharge Planning: Destination: PT-Home ,OT- Home , SLP- Home Projected Follow-up: PT-Home health PT, 24 hour supervision/assistance, OT-  Home health OT, SLP-Home Health SLP Projected Equipment Needs: PT-To be determined, OT- To be determined, SLP-None recommended by SLP Equipment Details:  PT- , OT-Patient has tub bench and commode seat Patient/family involved in discharge planning: PT- Patient,  OT-Family member/caregiver, Patient, SLP-Patient, Family member/caregiver  MD ELOS: 18-21 days Medical Rehab Prognosis:  Excellent Assessment: The patient has been admitted for CIR therapies with the diagnosis of CVA. The team will be addressing functional mobility, strength, stamina, balance, safety, adaptive techniques and equipment, self-care, bowel and bladder mgt, patient and caregiver education. Goals have been set at CG/MinA. Anticipated discharge destination is home.         See Team Conference Notes for weekly updates to the plan of care

## 2022-10-26 NOTE — Plan of Care (Signed)
  Problem: RH Swallowing Goal: LTG Pt will demonstrate functional change in swallow as evidenced by bedside/clinical objective assessment (SLP) Description: LTG: Patient will demonstrate functional change in swallow as evidenced by bedside/clinical objective assessment (SLP) Flowsheets (Taken 10/26/2022 1341) LTG: Patient will demonstrate functional change in swallow as evidenced by bedside/clinical objective assessment: Oropharyngeal swallow   Problem: RH Swallowing Goal: LTG Patient will consume least restrictive diet using compensatory strategies with assistance (SLP) Description: LTG:  Patient will consume least restrictive diet using compensatory strategies with assistance (SLP) Flowsheets (Taken 10/26/2022 1341) LTG: Pt Patient will consume least restrictive diet using compensatory strategies with assistance of (SLP): Minimal Assistance - Patient > 75%   Problem: RH Expression Communication Goal: LTG Patient will increase speech intelligibility (SLP) Description: LTG: Patient will increase speech intelligibility at word/phrase/conversation level with cues, % of the time (SLP) Flowsheets (Taken 10/26/2022 1341) LTG: Patient will increase speech intelligibility (SLP): Minimal Assistance - Patient > 75% Level: Phrase Percent of time patient will use intelligible speech: 80%   Problem: RH Attention Goal: LTG Patient will demonstrate this level of attention during functional activites (SLP) Description: LTG:  Patient will will demonstrate this level of attention during functional activites (SLP) Flowsheets (Taken 10/26/2022 1341) Patient will demonstrate during cognitive/linguistic activities the attention type of: Sustained Patient will demonstrate this level of attention during cognitive/linguistic activities in: Controlled LTG: Patient will demonstrate this level of attention during cognitive/linguistic activities with assistance of (SLP): Minimal Assistance - Patient > 75% Number of  minutes patient will demonstrate attention during cognitive/linguistic activities: 15   Problem: RH Awareness Goal: LTG: Patient will demonstrate awareness during functional activites type of (SLP) Description: LTG: Patient will demonstrate awareness during functional activites type of (SLP) Flowsheets (Taken 10/26/2022 1341) Patient will demonstrate during cognitive/linguistic activities awareness type of: Intellectual LTG: Patient will demonstrate awareness during cognitive/linguistic activities with assistance of (SLP): Minimal Assistance - Patient > 75%

## 2022-10-26 NOTE — Plan of Care (Signed)
Problem: RH Balance Goal: LTG: Patient will maintain dynamic sitting balance (OT) Description: LTG:  Patient will maintain dynamic sitting balance with assistance during activities of daily living (OT) Flowsheets (Taken 10/26/2022 1330) LTG: Pt will maintain dynamic sitting balance during ADLs with: Independent Goal: LTG Patient will maintain dynamic standing with ADLs (OT) Description: LTG:  Patient will maintain dynamic standing balance with assist during activities of daily living (OT)  Flowsheets (Taken 10/26/2022 1330) LTG: Pt will maintain dynamic standing balance during ADLs with: Supervision/Verbal cueing   Problem: Sit to Stand Goal: LTG:  Patient will perform sit to stand in prep for activites of daily living with assistance level (OT) Description: LTG:  Patient will perform sit to stand in prep for activites of daily living with assistance level (OT) Flowsheets (Taken 10/26/2022 1330) LTG: PT will perform sit to stand in prep for activites of daily living with assistance level: Supervision/Verbal cueing   Problem: RH Eating Goal: LTG Patient will perform eating w/assist, cues/equip (OT) Description: LTG: Patient will perform eating with assist, with/without cues using equipment (OT) Flowsheets (Taken 10/26/2022 1330) LTG: Pt will perform eating with assistance level of: Supervision/Verbal cueing   Problem: RH Grooming Goal: LTG Patient will perform grooming w/assist,cues/equip (OT) Description: LTG: Patient will perform grooming with assist, with/without cues using equipment (OT) Flowsheets (Taken 10/26/2022 1330) LTG: Pt will perform grooming with assistance level of: Supervision/Verbal cueing   Problem: RH Bathing Goal: LTG Patient will bathe all body parts with assist levels (OT) Description: LTG: Patient will bathe all body parts with assist levels (OT) Flowsheets (Taken 10/26/2022 1330) LTG: Pt will perform bathing with assistance level/cueing: Supervision/Verbal  cueing LTG: Position pt will perform bathing:  At sink  Shower   Problem: RH Dressing Goal: LTG Patient will perform upper body dressing (OT) Description: LTG Patient will perform upper body dressing with assist, with/without cues (OT). Flowsheets (Taken 10/26/2022 1330) LTG: Pt will perform upper body dressing with assistance level of: Set up assist Goal: LTG Patient will perform lower body dressing w/assist (OT) Description: LTG: Patient will perform lower body dressing with assist, with/without cues in positioning using equipment (OT) Flowsheets (Taken 10/26/2022 1330) LTG: Pt will perform lower body dressing with assistance level of: Supervision/Verbal cueing   Problem: RH Toileting Goal: LTG Patient will perform toileting task (3/3 steps) with assistance level (OT) Description: LTG: Patient will perform toileting task (3/3 steps) with assistance level (OT)  Flowsheets (Taken 10/26/2022 1330) LTG: Pt will perform toileting task (3/3 steps) with assistance level: Supervision/Verbal cueing   Problem: RH Vision Goal: RH LTG Vision Consulting civil engineer) Flowsheets (Taken 10/26/2022 1330) LTG: Vision Goals: Patient will visually attend to objects needed for BADL left of midline   Problem: RH Functional Use of Upper Extremity Goal: LTG Patient will use RT/LT upper extremity as a (OT) Description: LTG: Patient will use right/left upper extremity as a stabilizer/gross assist/diminished/nondominant/dominant level with assist, with/without cues during functional activity (OT) Flowsheets (Taken 10/26/2022 1330) LTG: Use of upper extremity in functional activities: LUE as gross assist level LTG: Pt will use upper extremity in functional activity with assistance level of: Supervision/Verbal cueing   Problem: RH Toilet Transfers Goal: LTG Patient will perform toilet transfers w/assist (OT) Description: LTG: Patient will perform toilet transfers with assist, with/without cues using equipment  (OT) Flowsheets (Taken 10/26/2022 1330) LTG: Pt will perform toilet transfers with assistance level of: Supervision/Verbal cueing   Problem: RH Tub/Shower Transfers Goal: LTG Patient will perform tub/shower transfers w/assist (OT) Description: LTG: Patient will  perform tub/shower transfers with assist, with/without cues using equipment (OT) Flowsheets (Taken 10/26/2022 1330) LTG: Pt will perform tub/shower stall transfers with assistance level of: Minimal Assistance - Patient > 75%   Problem: RH Attention Goal: LTG Patient will demonstrate this level of attention during functional activites (OT) Description: LTG:  Patient will demonstrate this level of attention during functional activites  (OT) Flowsheets (Taken 10/26/2022 1330) Patient will demonstrate this level of attention during functional activites: Alternating Patient will demonstrate above attention level in the following environment: Home LTG: Patient will demonstrate this level of attention during functional activites (OT): Minimal Assistance - Patient > 75%   Problem: RH Awareness Goal: LTG: Patient will demonstrate awareness during functional activites type of (OT) Description: LTG: Patient will demonstrate awareness during functional activites type of (OT) Flowsheets (Taken 10/26/2022 1330) Patient will demonstrate awareness during functional activites type of: Anticipatory LTG: Patient will demonstrate awareness during functional activites type of (OT): Minimal Assistance - Patient > 75%

## 2022-10-26 NOTE — Progress Notes (Signed)
Inpatient Rehabilitation  Patient information reviewed and entered into [facility] system by Oyuki Hogan M. Eri Mcevers, M.A., CCC/SLP, PPS Coordinator.  Information including medical coding, functional ability and quality indicators will be reviewed and updated through discharge.    

## 2022-10-26 NOTE — Evaluation (Signed)
Physical Therapy Assessment and Plan  Patient Details  Name: Joan Robinson MRN: [id number] Date of Birth: 03-May-1960  PT Diagnosis: Abnormal posture, Abnormality of gait, Ataxia, Ataxic gait, Cognitive deficits, Difficulty walking, Hemiplegia non-dominant, Impaired cognition, Impaired sensation, and Muscle weakness Rehab Potential: Fair ELOS: 2-3 weeks   Today's Date: 10/26/2022 PT Individual Time: 1300-1415 PT Individual Time Calculation (min): 75 min    Hospital Problem: Principal Problem:   CVA (cerebral vascular accident) [facility])   Past Medical History:  Past Medical History:  Diagnosis Date   Allergic rhinitis    Anemia 10/10/2011   Anxiety and depression 01/17/2007   Qualifier: Diagnosis of  By: Everardo All MD, Sean A    Arthritis 07/24/2013   Likely inflammatory and following with Dr Maryln Gottron [facility]  rheumatology   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   BCC (basal cell carcinoma of skin) 06/01/2012   Leg Follows with Dr Margo Aye   Bipolar disorder (HCC) 01/17/2007   Qualifier: Diagnosis of  By: Everardo All MD, Sean A    Cataract    Diverticulosis    Diverticulosis    Emphysema of lung (HCC)    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   Gallstones    GERD (gastroesophageal reflux disease)    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   Hashimoto's disease    Hyperlipidemia    Hypertension    Hypothyroidism 08/24/2006   Qualifier: Diagnosis of  By: Everardo All MD, Sean A     IBS (irritable bowel syndrome) 07/27/2016   Obesity 11/01/2013   PUD (peptic ulcer disease)    Sleep apnea 04/27/2016   Tobacco abuse    Past Surgical History:  Past Surgical History:  Procedure Laterality Date   ABDOMINAL HYSTERECTOMY     ABDOMINAL HYSTERECTOMY  01/15/2009   complete   COLONOSCOPY     DILATION AND CURETTAGE OF UTERUS  01/16/1983   EYE SURGERY  03/03/2014   Surgery on both eyes for epiretinal membrane (vitreous peel)   Gated Spect wall motion stress cardiolite  11/05/2001   HIATAL HERNIA REPAIR N/A  07/12/2021   Procedure: LAPAROSCOPY W/ EXTENSIVE FOREGUT DISSECTION; PARTIAL STOMACH REDUCTION; GASTROSTOMY TUBE PLACEMENT; GASTROPEXY;  Surgeon: Luretha Murphy, MD;  Location: [facility] ORS;  Service: General;  Laterality: N/A;   LAPAROSCOPIC RIGHT HEMI COLECTOMY Right 10/16/2022   Procedure: LAPAROSCOPIC ASSISTED RIGHT HEMI COLECTOMY;  Surgeon: Andria Meuse, MD;  Location: [facility] OR;  Service: General;  Laterality: Right;   POLYPECTOMY     TUBAL LIGATION  01/15/1993   UTERINE SUSPENSION     mesh   VITRECTOMY Bilateral 03/03/2014   XI ROBOTIC ASSISTED HIATAL HERNIA REPAIR N/A 05/01/2021   Procedure: XI ROBOTIC ASSISTED TYPE III HIATAL HERNIA REPAIR WITH FUNDOPLICATION;  Surgeon: Luretha Murphy, MD;  Location: [facility];  Service: General;  Laterality: N/A;    Assessment & Plan Clinical Impression: Patient is a 62 year old R handed female who presented to the [facility] ED on 10/13/2022 with complaints of abdominal pain times 2 days. CT scan abdomen and pelvis shows area of narrowing within the ascending colon distal to the cecum as well as an associated area of narrowing in the terminal ileum with focal dilatation of the cecum and proximal ascending colon as well as the small bowel proximal to the terminal ileum these changes are suspicious for internal hernia causing the long segment of narrowing and subsequent small bowel obstruction. General surgery consulted and she was treating conservatively initially with NGT decompression. Her nurse noted her left eye was drooping around 5 PM  9/29 and subsequently later in the evening someone noted her left face was drooping. RR/code stroke initiated and neurology consulted. CT head reveals what appears to be an acute left cerebellar infarct as well as possible involvement in the left medulla suspicious for possible acute posterior inferior cerebellar infarct. CTA of head and neck,CT perfusion negative for LVO, no core or mismatch. Given the moderately large  posterior fossa stroke which is in proximity to brainstem, and the extension of the stroke into the brainstem, she was transferred to [facility] medical ICU for immediate in person neurology evaluation and for close monitoring for signs of posterior fossa crowding and potential herniation. She has allergy to aspirin and Plavix held due to need for abdominal surgery. Treated for cerebellar edema with 3% saline. Repeat CT Abd/pelvis 10/01 demonstrated persistent and progressive findings. 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 Dr. Cliffton Asters discussed findings and recommendations for surgical intervention with patient and family. She underwent laparoscopic converted to open right hemicolectomy. CT repeated 10/02 unchanged cerebellar infarct, no hemorrhage, no hydrocephalus. Started on po liquids 10/03. Left facial droop, left gaze deficit and left ataxia. Cortrak placed on 10/04. Tapered hypertonic saline and started on Plavix. Transferred to hospitalist service. VTE prophy started with heparin. PEG tube discussed with patient and sister and in agreement. Appears that this is on hold until further monitoring of PO intake. She was advanced to D1 Diet with honey thick liquids. Developed draining from inferior aspect of midline incision today and this was drained at the bedside. The patient requires inpatient medicine and rehabilitation evaluations and services for ongoing dysfunction secondary to cerebellar stroke.  Patient transferred to CIR on 10/25/2022 .   Patient currently requires mod with mobility secondary to muscle weakness and muscle joint tightness, decreased cardiorespiratoy endurance and decreased oxygen support, unbalanced muscle activation, motor apraxia, ataxia, decreased coordination, and decreased motor planning, decreased visual acuity, decreased visual perceptual skills, and decreased visual motor skills, ideational apraxia, decreased initiation, decreased attention, decreased awareness,  decreased problem solving, decreased safety awareness, and decreased memory, and decreased sitting balance, decreased standing balance, decreased postural control, hemiplegia, and decreased balance strategies.  Prior to hospitalization, patient was independent  with mobility and lived with Spouse, Family in a House home.  Home access is 5Ramped entrance.  Patient will benefit from skilled PT intervention to maximize safe functional mobility, minimize fall risk, and decrease caregiver burden for planned discharge home with 24 hour assist.  Anticipate patient will benefit from follow up [facility] at discharge.  PT - End of Session Activity Tolerance: Tolerates < 10 min activity, no significant change in vital signs Endurance Deficit: Yes Endurance Deficit Description: weaning from supplemental O2, Difficulty maintaining eyes open PT Assessment Rehab Potential (ACUTE/IP ONLY): Fair PT Barriers to Discharge: Decreased caregiver support;Lack of/limited family support;Insurance for SNF coverage;New oxygen;Nutrition means PT Patient demonstrates impairments in the following area(s): Balance;Endurance;Motor;Perception;Safety;Sensory;Skin Integrity;Nutrition PT Transfers Functional Problem(s): Bed Mobility;Bed to Chair;Car PT Locomotion Functional Problem(s): Ambulation;Stairs PT Plan PT Intensity: Minimum of 1-2 x/day ,45 to 90 minutes PT Frequency: 5 out of 7 days PT Duration Estimated Length of Stay: 2-3 weeks PT Treatment/Interventions: Ambulation/gait training;Cognitive remediation/compensation;Discharge planning;DME/adaptive equipment instruction;Functional mobility training;Pain management;Psychosocial support;Splinting/orthotics;Therapeutic Activities;UE/LE Strength taining/ROM;Visual/perceptual remediation/compensation;Wheelchair propulsion/positioning;UE/LE Coordination activities;Therapeutic Exercise;Stair training;Skin care/wound management;Patient/family education;Neuromuscular re-education;Functional  electrical stimulation;Disease management/prevention;Balance/vestibular training;Community reintegration PT Transfers Anticipated Outcome(s): Supervision PT Locomotion Anticipated Outcome(s): CGA PT Recommendation Recommendations for Other Services: Neuropsych consult Follow Up Recommendations: Home health PT;24 hour supervision/assistance Patient destination: Home Equipment Recommended: To be determined  PT Evaluation Precautions/Restrictions Precautions Precautions: Fall Precaution Comments: 02, cortrak, indwelling cath, vision deficit, ataxia Restrictions Weight Bearing Restrictions: No Pain Interference Pain Interference Pain Effect on Sleep: 2. Occasionally Pain Interference with Therapy Activities: 2. Occasionally Pain Interference with Day-to-Day Activities: 2. Occasionally Home Living/Prior Functioning Home Living Available Help at Discharge: Family;Available 24 hours/day Type of Home: House Home Access: Ramped entrance Entrance Stairs-Number of Steps: 5 Entrance Stairs-Rails: Right;Left Home Layout: One level Bathroom Shower/Tub: Health visitor: Standard Bathroom Accessibility: Yes Additional Comments: sister has a WC if needed  Lives With: Spouse;Family Prior Function Level of Independence: Independent with basic ADLs;Independent with transfers;Independent with gait  Able to Take Stairs?: Yes Driving: Yes Vocation: Part time employment Vision/Perception  Vision - History Ability to See in Adequate Light: 4 Severely impaired Vision - Assessment Eye Alignment: Impaired (comment) Ocular Range of Motion: Impaired-to be further tested in functional context Alignment/Gaze Preference: Other (comment) (needs further assessment) Tracking/Visual Pursuits: Impaired - to be further tested in functional context Saccades: Impaired - to be further tested in functional context Convergence: Impaired - to be further tested in functional context Additional  Comments: L eye unable to close - has a patch Perception Perception: Impaired Preception Impairment Details: Inattention/Neglect;Spatial orientation;Body Scheme Praxis Praxis: Impaired Praxis Impairment Details: Initiation;Motor planning;Organization;Limb apraxia;Perseveration  Cognition Overall Cognitive Status: Impaired/Different from baseline Arousal/Alertness: Lethargic Orientation Level: Disoriented to time;Disoriented to situation;Oriented to person;Oriented to place Attention: Sustained;Focused Focused Attention: Appears intact Sustained Attention: Impaired Sustained Attention Impairment: Functional basic;Verbal basic Memory: Impaired Memory Impairment: Retrieval deficit;Decreased short term memory Decreased Short Term Memory: Functional basic Awareness: Impaired Awareness Impairment: Emergent impairment Problem Solving: Impaired Problem Solving Impairment: Functional basic Executive Function: Self Monitoring;Self Correcting;Decision Making;Initiating;Sequencing;Organizing Sequencing: Impaired Sequencing Impairment: Functional basic Organizing: Impaired Organizing Impairment: Functional basic Decision Making: Impaired Decision Making Impairment: Functional basic Initiating: Impaired Initiating Impairment: Functional basic Self Monitoring: Impaired Self Monitoring Impairment: Functional basic Self Correcting: Impaired Self Correcting Impairment: Functional basic Behaviors: Poor frustration tolerance;Lability;Other (comment) (anxious wit history of anxiety) Safety/Judgment: Impaired Comments: tearful at times, irritable with dauhter (per daughter) Sensation Sensation Light Touch: Impaired by gross assessment Hot/Cold: Impaired by gross assessment Proprioception: Impaired by gross assessment Stereognosis: Not tested Additional Comments: L side ataxic Coordination Gross Motor Movements are Fluid and Coordinated: No Fine Motor Movements are Fluid and Coordinated:  No Coordination and Movement Description: Ataxia, porrly graded control Finger Nose Finger Test: gross undershooting Heel Shin Test: slowed on L > R Motor  Motor Motor: Abnormal tone;Abnormal postural alignment and control;Motor impersistence;Hemiplegia;Motor apraxia;Ataxia   Trunk/Postural Assessment  Cervical Assessment Cervical Assessment: Exceptions to [facility] (forward head) Thoracic Assessment Thoracic Assessment: Exceptions to [facility] (rounded shoulders) Lumbar Assessment Lumbar Assessment: Exceptions to [facility] (posterior tilt) Postural Control Postural Control: Deficits on evaluation Protective Responses: premature and exaggerated  Balance Balance Balance Assessed: Yes Static Sitting Balance Static Sitting - Balance Support: No upper extremity supported;Feet supported Static Sitting - Level of Assistance: 4: Min assist Dynamic Sitting Balance Dynamic Sitting - Balance Support: No upper extremity supported;Feet supported;During functional activity Dynamic Sitting - Level of Assistance: 4: Min Oncologist Standing - Balance Support: Right upper extremity supported;Left upper extremity supported;During functional activity Static Standing - Level of Assistance: 3: Mod assist Dynamic Standing Balance Dynamic Standing - Balance Support: Right upper extremity supported;Left upper extremity supported;During functional activity Dynamic Standing - Level of Assistance: 4: Min assist Extremity Assessment  RUE Assessment RUE Assessment: Within Functional Limits LUE Assessment LUE Assessment: Exceptions to [facility]  Active Range of Motion (AROM) Comments: Can raise arm over head with trunk lateral flexion, elbow flexion.  ~ 110* shoulder flexion LUE Body System: Neuro Brunstrum levels for arm and hand: Arm;Hand Brunstrum level for arm: Stage IV Movement is deviating from synergy Brunstrum level for hand: Stage III Synergies performed voluntarily RLE Assessment RLE  Assessment: Exceptions to [facility] General Strength Comments: Grossly 4/5 LLE Assessment LLE Assessment: Exceptions to [facility] General Strength Comments: Grossly 4-/5  Care Tool Care Tool Bed Mobility Roll left and right activity   Roll left and right assist level: Minimal Assistance - Patient > 75%    Sit to lying activity   Sit to lying assist level: Minimal Assistance - Patient > 75%    Lying to sitting on side of bed activity   Lying to sitting on side of bed assist level: the ability to move from lying on the back to sitting on the side of the bed with no back support.: Minimal Assistance - Patient > 75%     Care Tool Transfers Sit to stand transfer   Sit to stand assist level: Minimal Assistance - Patient > 75%    Chair/bed transfer   Chair/bed transfer assist level: Moderate Assistance - Patient 50 - 74%     Toilet transfer Toilet transfer activity did not occur: Nurse, mental health transfer activity did not occur: Safety/medical concerns        Care Tool Locomotion Ambulation   Assist level: 2 helpers Assistive device: Walker-rolling Max distance: 52ft  Walk 10 feet activity   Assist level: 2 helpers Assistive device: Walker-rolling   Walk 50 feet with 2 turns activity Walk 50 feet with 2 turns activity did not occur: Safety/medical concerns (fatigue)      Walk 150 feet activity Walk 150 feet activity did not occur: Safety/medical concerns      Walk 10 feet on uneven surfaces activity Walk 10 feet on uneven surfaces activity did not occur: Safety/medical concerns      Stairs Stair activity did not occur: Safety/medical concerns        Walk up/down 1 step activity Walk up/down 1 step or curb (drop down) activity did not occur: Safety/medical concerns      Walk up/down 4 steps activity Walk up/down 4 steps activity did not occur: Safety/medical concerns      Walk up/down 12 steps activity Walk up/down 12 steps activity did not occur: Safety/medical  concerns      Pick up small objects from floor Pick up small object from the floor (from standing position) activity did not occur: Safety/medical concerns      Wheelchair Is the patient using a wheelchair?: Yes Type of Wheelchair: Manual   Wheelchair assist level: Dependent - Patient 0% Max wheelchair distance: 60ft  Wheel 50 feet with 2 turns activity   Assist Level: Dependent - Patient 0%  Wheel 150 feet activity   Assist Level: Dependent - Patient 0%    Refer to Care Plan for Long Term Goals  SHORT TERM GOAL WEEK 1 PT Short Term Goal 1 (Week 1): Pt will complete bed mobility with CGA PT Short Term Goal 2 (Week 1): Pt will complete bed<>chair transfers with CGA and LRAD PT Short Term Goal 3 (Week 1): Pt will ambulate 118ft with minA and LRAD  Recommendations for other services: Neuropsych  Skilled Therapeutic Intervention Mobility Bed Mobility Bed Mobility: Rolling Right;Right Sidelying to Sit;Supine to Sit;Sit to Supine Rolling Right: Minimal Assistance -  Patient > 75% Right Sidelying to Sit: Minimal Assistance - Patient > 75% Supine to Sit: Minimal Assistance - Patient > 75% Sit to Supine: Minimal Assistance - Patient > 75% Transfers Transfers: Sit to Stand;Stand to Sit;Stand Pivot Transfers Sit to Stand: Minimal Assistance - Patient > 75% Stand to Sit: Minimal Assistance - Patient > 75% Stand Pivot Transfers: Moderate Assistance - Patient 50 - 74% Stand Pivot Transfer Details: Tactile cues for initiation;Tactile cues for sequencing;Tactile cues for weight shifting;Tactile cues for posture;Tactile cues for placement;Verbal cues for sequencing;Manual facilitation for weight shifting;Manual facilitation for placement Transfer (Assistive device): None Locomotion  Gait Ambulation: Yes Gait Assistance: 2 Helpers;Moderate Assistance - Patient 50-74% Gait Distance (Feet): 15 Feet Gait Assistance Details: Tactile cues for initiation;Tactile cues for weight shifting;Tactile  cues for posture;Verbal cues for gait pattern;Verbal cues for technique;Verbal cues for precautions/safety;Verbal cues for safe use of DME/AE;Manual facilitation for weight shifting Gait Gait: Yes Gait Pattern: Impaired Gait Pattern: Step-through pattern;Lateral trunk lean to right;Trunk flexed;Ataxic;Decreased stride length;Decreased weight shift to left;Poor foot clearance - left Stairs / Additional Locomotion Stairs: No Wheelchair Mobility Wheelchair Mobility: No  Skilled Intervention: Pt greeted in bed, asleep but wakens to voice. Pt on 2.5L wall O2 via [place] - on portable O2 during treatment. ++ time needed for line/lead management during session for indwelling catheter, O2, and coretrack/IV pole management. Functional mobility completed as outlined above - needing minA for bed mobility, modA for stand pivot transfers, and +2 modA (w/c follow) for short distance gait up to 16ft. Patient presents with primarily limitations in activity tolerance, global weakness, L sided ataxia, severe vision impairments, and cognitive deficits. She was assisted back to bed at end of session; left with all needs met, alarm on, HOB > 30deg.   Instructed pt in results of PT evaluation as detailed above, PT POC, rehab potential, rehab goals, and discharge recommendations. Additionally discussed CIR's policies regarding fall safety and use of chair alarm and/or quick release belt. Pt verbalized understanding and in agreement. Will update pt's family members as they become available.    Discharge Criteria: Patient will be discharged from PT if patient refuses treatment 3 consecutive times without medical reason, if treatment goals not met, if there is a change in medical status, if patient makes no progress towards goals or if patient is discharged from hospital.  The above assessment, treatment plan, treatment alternatives and goals were discussed and mutually agreed upon: by patient  Orrin Brigham  PT, DPT,  CSRS  10/26/2022, 3:28 PM

## 2022-10-26 NOTE — Progress Notes (Signed)
Inpatient Rehabilitation Center Individual Statement of Services  Patient Name:  Joan Robinson  Date:  10/26/2022  Welcome to the Inpatient Rehabilitation Center.  Our goal is to provide you with an individualized program based on your diagnosis and situation, designed to meet your specific needs.  With this comprehensive rehabilitation program, you will be expected to participate in at least 3 hours of rehabilitation therapies Monday-Friday, with modified therapy programming on the weekends.  Your rehabilitation program will include the following services:  Physical Therapy (PT), Occupational Therapy (OT), Speech Therapy (ST), 24 hour per day rehabilitation nursing, Therapeutic Recreaction (TR), Neuropsychology, Care Coordinator, Rehabilitation Medicine, Nutrition Services, and Pharmacy Services  Weekly team conferences will be held on Wednesday to discuss your progress.  Your Inpatient Rehabilitation Care Coordinator will talk with you frequently to get your input and to update you on team discussions.  Team conferences with you and your family in attendance may also be held.  Expected length of stay: 18-21 days  Overall anticipated outcome: supervision-min with tub and car transfers  Depending on your progress and recovery, your program may change. Your Inpatient Rehabilitation Care Coordinator will coordinate services and will keep you informed of any changes. Your Inpatient Rehabilitation Care Coordinator's name and contact numbers are listed  below.  The following services may also be recommended but are not provided by the Inpatient Rehabilitation Center:  Driving Evaluations Home Health Rehabiltiation Services Outpatient Rehabilitation Services Vocational Rehabilitation   Arrangements will be made to provide these services after discharge if needed.  Arrangements include referral to agencies that provide these services.  Your insurance has been verified to be:  [employer] Your primary  doctor is:  Kathlen Brunswick  Pertinent information will be shared with your doctor and your insurance company.  Inpatient Rehabilitation Care Coordinator:  Dossie Der, [place] [phone] or Luna Glasgow  Information discussed with and copy given to patient by: Lucy Chris, 10/26/2022, 9:51 AM

## 2022-10-26 NOTE — Evaluation (Signed)
Occupational Therapy Assessment and Plan  Patient Details  Name: Joan Robinson MRN: [id number] Date of Birth: 06/29/1960  OT Diagnosis: abnormal posture, acute pain, altered mental status, apraxia, ataxia, blindness and low vision, cognitive deficits, disturbance of vision, hemiplegia affecting non-dominant side, muscle weakness (generalized), and pain in joint Rehab Potential: Rehab Potential (ACUTE ONLY): Good ELOS: 18-21 days   Today's Date: 10/26/2022 OT Individual Time: 0902-1000 OT Individual Time Calculation (min): 58 min     Hospital Problem: Principal Problem:   CVA (cerebral vascular accident) [facility])   Past Medical History:  Past Medical History:  Diagnosis Date   Allergic rhinitis    Anemia 10/10/2011   Anxiety and depression 01/17/2007   Qualifier: Diagnosis of  By: Everardo All MD, Sean A    Arthritis 07/24/2013   Likely inflammatory and following with Dr Maryln Gottron of [facility]  rheumatology   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   BCC (basal cell carcinoma of skin) 06/01/2012   Leg Follows with Dr Margo Aye   Bipolar disorder (HCC) 01/17/2007   Qualifier: Diagnosis of  By: Everardo All MD, Sean A    Cataract    Diverticulosis    Diverticulosis    Emphysema of lung (HCC)    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   Gallstones    GERD (gastroesophageal reflux disease)    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   Hashimoto's disease    Hyperlipidemia    Hypertension    Hypothyroidism 08/24/2006   Qualifier: Diagnosis of  By: Everardo All MD, Sean A     IBS (irritable bowel syndrome) 07/27/2016   Obesity 11/01/2013   PUD (peptic ulcer disease)    Sleep apnea 04/27/2016   Tobacco abuse    Past Surgical History:  Past Surgical History:  Procedure Laterality Date   ABDOMINAL HYSTERECTOMY     ABDOMINAL HYSTERECTOMY  01/15/2009   complete   COLONOSCOPY     DILATION AND CURETTAGE OF UTERUS  01/16/1983   EYE SURGERY  03/03/2014   Surgery on both eyes for epiretinal membrane (vitreous peel)   [place]  Spect wall motion stress cardiolite  11/05/2001   HIATAL HERNIA REPAIR N/A 07/12/2021   Procedure: LAPAROSCOPY W/ EXTENSIVE FOREGUT DISSECTION; PARTIAL STOMACH REDUCTION; GASTROSTOMY TUBE PLACEMENT; GASTROPEXY;  Surgeon: Luretha Murphy, MD;  Location: [facility] ORS;  Service: General;  Laterality: N/A;   LAPAROSCOPIC RIGHT HEMI COLECTOMY Right 10/16/2022   Procedure: LAPAROSCOPIC ASSISTED RIGHT HEMI COLECTOMY;  Surgeon: Andria Meuse, MD;  Location: [facility] OR;  Service: General;  Laterality: Right;   POLYPECTOMY     TUBAL LIGATION  01/15/1993   UTERINE SUSPENSION     mesh   VITRECTOMY Bilateral 03/03/2014   XI ROBOTIC ASSISTED HIATAL HERNIA REPAIR N/A 05/01/2021   Procedure: XI ROBOTIC ASSISTED TYPE III HIATAL HERNIA REPAIR WITH FUNDOPLICATION;  Surgeon: Luretha Murphy, MD;  Location: [facility] ORS;  Service: General;  Laterality: N/A;    Assessment & Plan Clinical Impression: Joan Robinson is a 62 year old R handed female who presented to the [facility] ED on 10/13/2022 with complaints of abdominal pain times 2 days. CT scan abdomen and pelvis shows area of narrowing within the ascending colon distal to the cecum as well as an associated area of narrowing in the terminal ileum with focal dilatation of the cecum and proximal ascending colon as well as the small bowel proximal to the terminal ileum these changes are suspicious for internal hernia causing the long segment of narrowing and subsequent small bowel obstruction. General surgery consulted and she was treating conservatively initially  with NGT decompression. Her nurse noted her left eye was drooping around 5 PM 9/29 and subsequently later in the evening someone noted her left face was drooping. RR/code stroke initiated and neurology consulted. CT head reveals what appears to be an acute left cerebellar infarct as well as possible involvement in the left medulla suspicious for possible acute posterior inferior cerebellar infarct. CTA of head and neck,CT  perfusion negative for LVO, no core or mismatch. Given the moderately large posterior fossa stroke which is in proximity to brainstem, and the extension of the stroke into the brainstem, she was transferred to [facility] medical ICU for immediate in person neurology evaluation and for close monitoring for signs of posterior fossa crowding and potential herniation. She has allergy to aspirin and Plavix held due to need for abdominal surgery. Treated for cerebellar edema with 3% saline. Repeat CT Abd/pelvis 10/01 demonstrated persistent and progressive findings. 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 Dr. Cliffton Asters discussed findings and recommendations for surgical intervention with patient and family. She underwent laparoscopic converted to open right hemicolectomy. CT repeated 10/02 unchanged cerebellar infarct, no hemorrhage, no hydrocephalus. Started on po liquids 10/03. Left facial droop, left gaze deficit and left ataxia. Cortrak placed on 10/04. Tapered hypertonic saline and started on Plavix. Transferred to hospitalist service. VTE prophy started with heparin. PEG tube discussed with patient and sister and in agreement. Appears that this is on hold until further monitoring of PO intake. She was advanced to D1 Diet with honey thick liquids. Developed draining from inferior aspect of midline incision today and this was drained at the bedside. The patient requires inpatient medicine and rehabilitation evaluations and services for ongoing dysfunction secondary to cerebellar stroke. Patient transferred to CIR on 10/25/2022 .    Patient currently requires max with basic self-care skills secondary to decreased cardiorespiratoy endurance, impaired timing and sequencing, abnormal tone, unbalanced muscle activation, motor apraxia, ataxia, decreased coordination, and decreased motor planning, decreased visual acuity, decreased visual perceptual skills, decreased visual motor skills, and field cut, decreased  attention to left, decreased initiation, decreased attention, decreased awareness, decreased problem solving, decreased safety awareness, decreased memory, and delayed processing, and decreased sitting balance, decreased standing balance, decreased postural control, hemiplegia, and decreased balance strategies.  Prior to hospitalization, patient could complete BADL with independent .  Patient will benefit from skilled intervention to decrease level of assist with basic self-care skills and increase independence with basic self-care skills prior to discharge home with care partner.  Anticipate patient will require minimal physical assistance and follow up home health.  OT - End of Session Activity Tolerance: Decreased this session Endurance Deficit: Yes Endurance Deficit Description: weaning from supplemental O2, Difficulty maintaining eyes open OT Assessment Rehab Potential (ACUTE ONLY): Good OT Patient demonstrates impairments in the following area(s): Balance;Endurance;Perception;Vision;Behavior;Motor;Safety;Cognition;Nutrition;Sensory;Pain OT Basic ADL's Functional Problem(s): Eating;Grooming;Bathing;Dressing;Toileting OT Transfers Functional Problem(s): Toilet;Tub/Shower OT Additional Impairment(s): Fuctional Use of Upper Extremity OT Plan OT Intensity: Minimum of 1-2 x/day, 45 to 90 minutes OT Frequency: 5 out of 7 days OT Duration/Estimated Length of Stay: 18-21 days OT Treatment/Interventions: Balance/vestibular training;Disease mangement/prevention;Functional electrical stimulation;Neuromuscular re-education;Patient/family education;Self Care/advanced ADL retraining;Splinting/orthotics;Therapeutic Exercise;UE/LE Coordination activities;Wheelchair propulsion/positioning;Visual/perceptual remediation/compensation;UE/LE Strength taining/ROM;Therapeutic Activities;Skin care/wound managment;Psychosocial support;Pain management;Functional mobility training;DME/adaptive equipment  instruction;Discharge planning;Cognitive remediation/compensation OT Self Feeding Anticipated Outcome(s): Supervivion OT Basic Self-Care Anticipated Outcome(s): Min assist OT Toileting Anticipated Outcome(s): min assist OT Bathroom Transfers Anticipated Outcome(s): min assist OT Recommendation Recommendations for Other Services: Neuropsych consult Patient destination: Home Follow Up Recommendations: Home health OT Equipment Recommended: To  be determined Equipment Details: Patient has tub bench and commode seat   OT Evaluation Precautions/Restrictions  Precautions Precautions: Fall Precaution Comments: 02, cortrak, indwelling cath, vision deficit Restrictions Weight Bearing Restrictions: No   Vital Signs Therapy Vitals BP: 107/75 Patient Position (if appropriate): Sitting Oxygen Therapy SpO2: 97 % O2 Device: Room Air Patient Activity (if Appropriate): In chair Pain Pain Assessment Pain Scale: Faces Pain Score: 7  Faces Pain Scale: Hurts whole lot Pain Type: Acute pain Pain Location: Ankle Pain Orientation: Right;Left Pain Descriptors / Indicators: Aching Pain Onset: On-going Patients Stated Pain Goal: 0 Pain Intervention(s): Repositioned Home Living/Prior Functioning Home Living Family/patient expects to be discharged to:: Private residence Living Arrangements: Spouse/significant other Available Help at Discharge: Family, Available 24 hours/day Type of Home: House Home Access: Ramped entrance Entrance Stairs-Number of Steps: 5 Entrance Stairs-Rails: Right, Left Home Layout: One level Bathroom Shower/Tub: Health visitor: Standard Bathroom Accessibility: Yes Additional Comments: sister has a WC if needed  Lives With: Spouse, Family IADL History Homemaking Responsibilities: Yes Current License: Yes Occupation: Retired Leisure and Hobbies: Social research officer, government, bird watching Prior Function Level of Independence: Independent with basic ADLs Driving:  Yes Vocation: Retired Administrator, sports Baseline Vision/History: 1 Wears glasses Ability to See in Adequate Light: 4 Severely impaired Patient Visual Report: Nausea/blurring vision with head movement;Other (comment) (Floaters - has glaucoma, and mcular eye disease) Vision Assessment?: Yes Eye Alignment: Impaired (comment) Ocular Range of Motion: Impaired-to be further tested in functional context Alignment/Gaze Preference: Other (comment) (needs further assessment - very sleepy initially) Tracking/Visual Pursuits: Impaired - to be further tested in functional context Saccades: Impaired - to be further tested in functional context Convergence: Impaired - to be further tested in functional context Visual Fields: Left visual field deficit;Impaired-to be further tested in functional context Additional Comments: Left eye unable to close - has patch Perception  Perception: Impaired Praxis Praxis: Impaired Praxis Impairment Details: Initiation;Motor planning;Organization;Limb apraxia;Perseveration Cognition Cognition Overall Cognitive Status: Impaired/Different from baseline Arousal/Alertness: Lethargic Orientation Level: Person;Situation;Place Person: Oriented Place: Oriented Situation: Oriented Memory: Impaired Memory Impairment: Retrieval deficit;Decreased short term memory Decreased Short Term Memory: Functional basic Attention: Sustained Focused Attention: Appears intact Sustained Attention: Impaired Sustained Attention Impairment: Functional basic Awareness: Impaired Awareness Impairment: Emergent impairment Problem Solving: Impaired Problem Solving Impairment: Functional basic Executive Function: Self Monitoring;Self Correcting;Decision Making;Initiating;Sequencing;Organizing Sequencing: Impaired Sequencing Impairment: Functional basic Organizing: Impaired Organizing Impairment: Functional basic Decision Making: Impaired Decision Making Impairment: Functional basic Initiating:  Impaired Initiating Impairment: Functional basic Self Monitoring: Impaired Self Monitoring Impairment: Functional basic Self Correcting: Impaired Self Correcting Impairment: Functional basic Behaviors: Poor frustration tolerance;Lability;Other (comment) (anxious wit history of anxiety) Safety/Judgment: Impaired Comments: tearful at times, irritable with dauhter (per daughter) Brief Interview for Mental Status (BIMS) Repetition of Three Words (First Attempt): 3 Temporal Orientation: Year: Correct Temporal Orientation: Month: Accurate within 5 days Temporal Orientation: Day: Correct Recall: "Sock": Yes, no cue required Recall: "Blue": Yes, no cue required Recall: "Bed": No, could not recall BIMS Summary Score: 13 Sensation Sensation Light Touch: Impaired by gross assessment Hot/Cold: Impaired by gross assessment Proprioception: Impaired by gross assessment Stereognosis: Not tested Additional Comments: ataxic movements in LUE, seeminglu unaware of position of arm behind her Coordination Gross Motor Movements are Fluid and Coordinated: No Fine Motor Movements are Fluid and Coordinated: No Coordination and Movement Description: Ataxia, porrly graded control Finger Nose Finger Test: gross undershooting Motor  Motor Motor: Abnormal tone;Abnormal postural alignment and control;Motor impersistence;Hemiplegia;Motor apraxia;Ataxia  Trunk/Postural Assessment  Cervical Assessment Cervical Assessment: Exceptions to [facility] (  forward head) Thoracic Assessment Thoracic Assessment: Exceptions to [facility] (flexed trunk, rotated right) Lumbar Assessment Lumbar Assessment: Exceptions to [facility] (posterior pelvis) Postural Control Postural Control: Deficits on evaluation Protective Responses: premature and exaggerated  Balance Balance Balance Assessed: Yes Static Sitting Balance Static Sitting - Balance Support: No upper extremity supported;Feet supported Static Sitting - Level of Assistance: 4: Min  assist Dynamic Sitting Balance Dynamic Sitting - Balance Support: No upper extremity supported;Feet supported;During functional activity Dynamic Sitting - Level of Assistance: 4: Min Oncologist Standing - Balance Support: Right upper extremity supported;Left upper extremity supported;During functional activity Static Standing - Level of Assistance: 3: Mod assist Dynamic Standing Balance Dynamic Standing - Balance Support: Right upper extremity supported;Left upper extremity supported;During functional activity Dynamic Standing - Level of Assistance: 4: Min assist Extremity/Trunk Assessment RUE Assessment RUE Assessment: Within Functional Limits LUE Assessment LUE Assessment: Exceptions to [facility] Active Range of Motion (AROM) Comments: Can raise arm over head with trunk lateral flexion, elbow flexion.  ~ 110* shoulder flexion LUE Body System: Neuro Brunstrum levels for arm and hand: Arm;Hand Brunstrum level for arm: Stage IV Movement is deviating from synergy Brunstrum level for hand: Stage III Synergies performed voluntarily  Care Tool Care Tool Self Care Eating Eating activity did not occur: Safety/medical concerns      Oral Care    Oral Care Assist Level: Dependent - Patient 0%)    Bathing   Body parts bathed by patient: Left arm;Chest;Abdomen;Face Body parts bathed by helper: Right arm;Left lower leg;Right lower leg;Buttocks;Front perineal area;Right upper leg;Left upper leg   Assist Level: Maximal Assistance - Patient 24 - 49%    Upper Body Dressing(including orthotics)   What is the patient wearing?: Pull over shirt   Assist Level: Moderate Assistance - Patient 50 - 74%    Lower Body Dressing (excluding footwear)   What is the patient wearing?: Pants Assist for lower body dressing: Total Assistance - Patient < 25%    Putting on/Taking off footwear   What is the patient wearing?: Socks Assist for footwear: Moderate Assistance - Patient 50 -  74%       Care Tool Toileting Toileting activity Toileting Activity did not occur (Clothing management and hygiene only): N/A (no void or bm)       Care Tool Bed Mobility Roll left and right activity   Roll left and right assist level: Minimal Assistance - Patient > 75%    Sit to lying activity   Sit to lying assist level: Minimal Assistance - Patient > 75%    Lying to sitting on side of bed activity   Lying to sitting on side of bed assist level: the ability to move from lying on the back to sitting on the side of the bed with no back support.: Minimal Assistance - Patient > 75%     Care Tool Transfers Sit to stand transfer   Sit to stand assist level: Moderate Assistance - Patient 50 - 74%    Chair/bed transfer   Chair/bed transfer assist level: Moderate Assistance - Patient 50 - 74%     Toilet transfer Toilet transfer activity did not occur: Refused       Care Tool Cognition  Expression of Ideas and Wants Expression of Ideas and Wants: 3. Some difficulty - exhibits some difficulty with expressing needs and ideas (e.g, some words or finishing thoughts) or speech is not clear  Understanding Verbal and Non-Verbal Content Understanding Verbal and Non-Verbal Content: 3. Usually understands -  understands most conversations, but misses some part/intent of message. Requires cues at times to understand   Memory/Recall Ability Memory/Recall Ability : That he or she is in a hospital/hospital unit;Current season   Refer to Care Plan for Long Term Goals  SHORT TERM GOAL WEEK 1 OT Short Term Goal 1 (Week 1): Patient will maintain arousal x 45 min during BADL activity OT Short Term Goal 2 (Week 1): Patient will complete upper boody bathing with min assist OT Short Term Goal 3 (Week 1): Patient will complete lower body bathing with mod assist OT Short Term Goal 4 (Week 1): Patient will don/doff pull over shirt with min assist OT Short Term Goal 5 (Week 1): Patient will don pants with  mod assist  Recommendations for other services: Neuropsych   Skilled Therapeutic Intervention:   Patient received supine in bed, very sleepy, but arousable.  Patient's daughter present.  Daughter reporting patient has been irritable with her, and upset that people are not explaining things to her.  Daughter and MD had conversation about patient's sensitivity to medications and best option for anxiety management.  Patient able to sit at edge of bed and trasnfer to wheelchair with min assist. Completed ADL as indicated.  Removed O2 via [place] during ADL and maintained O2 at 97-99%.  Patient fatigued at end of session.  Left up in wheelchair with daughter at bedside and personal items in reach.   ADL ADL Eating: Unable to assess;Other (comment) (cortrak - DI) Grooming: Dependent Where Assessed-Grooming: Sitting at sink Upper Body Bathing: Moderate assistance Where Assessed-Upper Body Bathing: Sitting at sink Lower Body Bathing: Dependent Where Assessed-Lower Body Bathing: Sitting at sink;Standing at sink Upper Body Dressing: Moderate assistance Where Assessed-Upper Body Dressing: Sitting at sink Lower Body Dressing: Dependent Where Assessed-Lower Body Dressing: Sitting at sink;Standing at sink Toileting: Unable to assess Toilet Transfer: Unable to assess Toilet Transfer Method: Unable to assess Tub/Shower Transfer: Unable to assess Tub/Shower Transfer Method: Unable to assess Film/video editor: Unable to assess Film/video editor Method: Unable to assess Mobility  Bed Mobility Bed Mobility: Rolling Right;Right Sidelying to Sit Rolling Right: Minimal Assistance - Patient > 75% Right Sidelying to Sit: Minimal Assistance - Patient > 75% Transfers Sit to Stand: Minimal Assistance - Patient > 75% Stand to Sit: Minimal Assistance - Patient > 75%   Discharge Criteria: Patient will be discharged from OT if patient refuses treatment 3 consecutive times without medical reason, if  treatment goals not met, if there is a change in medical status, if patient makes no progress towards goals or if patient is discharged from hospital.  The above assessment, treatment plan, treatment alternatives and goals were discussed and mutually agreed upon: by patient and by family  [name] 10/26/2022, 1:23 PM

## 2022-10-26 NOTE — Plan of Care (Signed)
  Problem: RH Balance Goal: LTG Patient will maintain dynamic standing balance (PT) Description: LTG:  Patient will maintain dynamic standing balance with assistance during mobility activities (PT) Flowsheets (Taken 10/26/2022 1533) LTG: Pt will maintain dynamic standing balance during mobility activities with:: Contact Guard/Touching assist   Problem: Sit to Stand Goal: LTG:  Patient will perform sit to stand with assistance level (PT) Description: LTG:  Patient will perform sit to stand with assistance level (PT) Flowsheets (Taken 10/26/2022 1533) LTG: PT will perform sit to stand in preparation for functional mobility with assistance level: Supervision/Verbal cueing   Problem: RH Bed Mobility Goal: LTG Patient will perform bed mobility with assist (PT) Description: LTG: Patient will perform bed mobility with assistance, with/without cues (PT). Flowsheets (Taken 10/26/2022 1533) LTG: Pt will perform bed mobility with assistance level of: Supervision/Verbal cueing   Problem: RH Bed to Chair Transfers Goal: LTG Patient will perform bed/chair transfers w/assist (PT) Description: LTG: Patient will perform bed to chair transfers with assistance (PT). Flowsheets (Taken 10/26/2022 1533) LTG: Pt will perform Bed to Chair Transfers with assistance level: Contact Guard/Touching assist   Problem: RH Car Transfers Goal: LTG Patient will perform car transfers with assist (PT) Description: LTG: Patient will perform car transfers with assistance (PT). Flowsheets (Taken 10/26/2022 1533) LTG: Pt will perform car transfers with assist:: Minimal Assistance - Patient > 75%   Problem: RH Ambulation Goal: LTG Patient will ambulate in controlled environment (PT) Description: LTG: Patient will ambulate in a controlled environment, # of feet with assistance (PT). Flowsheets (Taken 10/26/2022 1533) LTG: Pt will ambulate in controlled environ  assist needed:: Contact Guard/Touching assist LTG: Ambulation  distance in controlled environment: 159ft Goal: LTG Patient will ambulate in home environment (PT) Description: LTG: Patient will ambulate in home environment, # of feet with assistance (PT). Flowsheets (Taken 10/26/2022 1533) LTG: Pt will ambulate in home environ  assist needed:: Contact Guard/Touching assist LTG: Ambulation distance in home environment: 43ft

## 2022-10-26 NOTE — Progress Notes (Signed)
Inpatient Rehabilitation Care Coordinator Assessment and Plan Patient Details  Name: Joan Robinson MRN: [id number] Date of Birth: [birth year]  Today's Date: 10/26/2022  Hospital Problems: Principal Problem:   CVA (cerebral vascular accident) [facility])  Past Medical History:  Past Medical History:  Diagnosis Date   Allergic rhinitis    Anemia 10/10/2011   Anxiety and depression 01/17/2007   Qualifier: Diagnosis of  By: Everardo All MD, Sean A    Arthritis 07/24/2013   Likely inflammatory and following with Dr Maryln Gottron of 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   Hashimoto's disease    Hyperlipidemia    Hypertension    Hypothyroidism 08/24/2006   Qualifier: Diagnosis of  By: Everardo All MD, Sean A     IBS (irritable bowel syndrome) 07/27/2016   Obesity 11/01/2013   PUD (peptic ulcer disease)    Sleep apnea 04/27/2016   Tobacco abuse    Past Surgical History:  Past Surgical History:  Procedure Laterality Date   ABDOMINAL HYSTERECTOMY     ABDOMINAL HYSTERECTOMY  01/15/2009   complete   COLONOSCOPY     DILATION AND CURETTAGE OF UTERUS  01/16/1983   EYE SURGERY  03/03/2014   Surgery on both eyes for epiretinal membrane (vitreous peel)   Gated Spect wall motion stress cardiolite  11/05/2001   HIATAL HERNIA REPAIR N/A 07/12/2021   Procedure: LAPAROSCOPY W/ EXTENSIVE FOREGUT DISSECTION; PARTIAL STOMACH REDUCTION; GASTROSTOMY TUBE PLACEMENT; GASTROPEXY;  Surgeon: Luretha Murphy, MD;  Location: [facility];  Service: General;  Laterality: N/A;   LAPAROSCOPIC RIGHT HEMI COLECTOMY Right 10/16/2022   Procedure:  LAPAROSCOPIC ASSISTED RIGHT HEMI COLECTOMY;  Surgeon: Andria Meuse, MD;  Location: [facility] OR;  Service: General;  Laterality: Right;   POLYPECTOMY     TUBAL LIGATION  01/15/1993   UTERINE SUSPENSION     mesh   VITRECTOMY Bilateral 03/03/2014   XI ROBOTIC ASSISTED HIATAL HERNIA REPAIR N/A 05/01/2021   Procedure: XI ROBOTIC ASSISTED TYPE III HIATAL HERNIA REPAIR WITH FUNDOPLICATION;  Surgeon: Luretha Murphy, MD;  Location: [facility];  Service: General;  Laterality: N/A;   Social History:  reports that she quit smoking about 13 years ago. Her smoking use included cigarettes. She started smoking about 43 years ago. She has a 30 pack-year smoking history. She has never used smokeless tobacco. She reports that she does not drink alcohol and does not use drugs.  Family / Support Systems Marital Status: Married How Long?: 44 YEARS Patient Roles: Spouse, Parent, Other (Comment) (Employee) Spouse/Significant Other: [phone] Children: Stephanie-daughter [phone] has a son also Other Supports: Kay-sister [phone] has another sister Anticipated Caregiver: Judeth Cornfield and husband Ability/Limitations of Caregiver: Husband has health issues daughter plans to be the main caregiver for pt Family Dynamics: Close with two childrena dn grandchildren, she has two sister;s who are involved and will be here often. Family will pull together to make sure pt's needs are met  Social History Preferred language: English Religion: Baptist Cultural Background: No issues Education: HS Health Literacy - How often do you need to have someone help you when  you read instructions, pamphlets, or other written material from your doctor or pharmacy?: Never Writes: Yes Employment Status: Employed Name of Employer: [employer] Return to Work Plans: Will need to recover to be able to return to work Marine scientist Issues: No issues Guardian/Conservator: None-according to MD pt is not fully capable of  making her own decisions while here. Will look toward her husband since no formal HCPOA-POA in place. Daughter plans to be here daily also   Abuse/Neglect Abuse/Neglect Assessment Can Be Completed: Yes Physical Abuse: Denies Verbal Abuse: Denies Sexual Abuse: Denies Exploitation of patient/patient's resources: Denies Self-Neglect: Denies  Patient response to: Social Isolation - How often do you feel lonely or isolated from those around you?: Never  Emotional Status Pt's affect, behavior and adjustment status: Pt is wanting to recover and get back to her independent level. She is still having difficulty with what has happened to her and feels out of control. She reports with her vision issue she really needs staff to tell her who they are and what they are going to do prior to just pulling on her or grabing her arm. Recent Psychosocial Issues: has prior health issues but remained independent and she is not one to ask others for help Psychiatric History: hx-bipolar and depression/anxiety she feels very anxious today and daughter asked if on her medications she will need to be. Will place on neuro-psych list pt to be seen early next week due to much has happened to her since admission to the hospital Substance Abuse History: No issues  Patient / Family Perceptions, Expectations & Goals Pt/Family understanding of illness & functional limitations: Pt and daughter who is present in her room are looking forward to the MD rounding due to has medical quesitons. They like to be kept updated and involved with her medical issues. Will let MD/PA know this. Premorbid pt/family roles/activities: wife, mom, grandmother, employee, sibling, neighbor, etc Anticipated changes in roles/activities/participation: resume Pt/family expectations/goals: Pt states: " I am afraid I can't see and don't know what to I can't move on my own ."  Daughter states: " It is stressful seeing Mom like this."  [employer]: None Premorbid Home Care/DME Agencies: Other (Comment) (rw, tub seat, bcs, cane) Transportation available at discharge: husband and family Is the patient able to respond to transportation needs?: Yes In the past 12 months, has lack of transportation kept you from medical appointments or from getting medications?: No In the past 12 months, has lack of transportation kept you from meetings, work, or from getting things needed for daily living?: No Resource referrals recommended: Neuropsychology  Discharge Planning Living Arrangements: Spouse/significant other Support Systems: Spouse/significant other, Children, Other relatives, Friends/neighbors, Church/faith community Type of Residence: Private residence Insurance Resources: Media planner (specify) Herbalist) Financial Resources: Employment, Garment/textile technologist Screen Referred: No Living Expenses: Banker Management: Patient, Spouse Does the patient have any problems obtaining your medications?: No Home Management: both Patient/Family Preliminary Plans: Return home with husband and daughter who will be here primary caregiver. She plans to be here daily to provide support and learn her Mom's care. Discussed rehab evaluations and goals being set today for her stay and team conference every Wed and will let pt and family know the results. Care Coordinator Barriers to Discharge: Insurance for SNF coverage Care Coordinator Anticipated Follow Up Needs: HH/OP  Clinical Impression Pt still trying to process all that has happened to her and is quite anxious. Daughter wants to be sure she  is on her medications for this not sure she is. Have asked PA regarding this. Also let staff and team know to let pt know who they are when entering the room and what they will be doing while here. Pt reports with her visual issues it makes it very difficult and she is not one to be pulled on and not informed what for. Have  placed on neuro-psych list to be seen and will check back later today to see how she is doing. Continue to work on discharge needs.   Lucy Chris 10/26/2022, 9:48 AM

## 2022-10-26 NOTE — Progress Notes (Signed)
PROGRESS NOTE   Subjective/Complaints: +anxiety- home lexapro restarted at 10mg  daily to begin tomorrow, received a prn buspar this morning, d/ced that order as daughter says patient is sensitive to medication  ROS: +anxiety   Objective:   No results found. Recent Labs    10/24/22 0452  WBC 8.9  HGB 11.8*  HCT 35.9*  PLT 263   Recent Labs    10/24/22 0452  NA 139  K 4.0  CL 105  CO2 21*  GLUCOSE 134*  BUN 17  CREATININE 0.77  CALCIUM 8.6*    Intake/Output Summary (Last 24 hours) at 10/26/2022 1034 Last data filed at 10/26/2022 0737 Gross per 24 hour  Intake 0 ml  Output 1250 ml  Net -1250 ml        Physical Exam: Vital Signs Blood pressure 115/83, pulse 85, temperature 98 F (36.7 C), resp. rate 17, height 5\' 6"  (1.676 m), weight 68.1 kg, SpO2 95%. Constitutional:      Comments: L eyepatch in place, NAD Husband at bedside  HENT:     Head: Normocephalic and atraumatic.     Comments: L facial droop L eye will not close- covered with eyepatch Cortrak in place- TF's off    Right Ear: External ear normal.     Left Ear: External ear normal.     Nose: Nose normal. No congestion.     Mouth/Throat:     Mouth: Mucous membranes are dry.     Pharynx: Oropharynx is clear. No oropharyngeal exudate.      Eyes:     Comments: Left lower lid everted and erythematous  Cardiovascular:     Rate and Rhythm: Normal rate and regular rhythm.     Heart sounds: Normal heart sounds. No murmur heard.    No gallop.  Pulmonary:     Effort: Pulmonary effort is normal.     Breath sounds: Normal breath sounds.     Comments: Snoring loudly hard to hear, but doesn't sound like W/R/R Abdominal:     General: Bowel sounds are normal.     Comments: Dressing over midline incision intact- just changed C/D/I Hypoactive BS- mildly hypoactive- mildly TTP diffusely.   Genitourinary:    Comments: Foley- medium amber  urine Musculoskeletal:     Cervical back: Neck supple. No tenderness.     Comments: Moving legs both extremely well and RUE- not spontaneously moving LUE much, however is asleep  Skin:    General: Skin is warm and dry.     Comments: IV L AC fossa and R forearm- look OK Sweating on face slightly- appears warm, but doesn't feel feverish  Neurological:     Comments: Somnolent- loudly snoring- cannot get her to wake up for more than ~ 30 seconds.  GU: foley in place  Assessment/Plan: 1. Functional deficits which require 3+ hours per day of interdisciplinary therapy in a comprehensive inpatient rehab setting. Physiatrist is providing close team supervision and 24 hour management of active medical problems listed below. Physiatrist and rehab team continue to assess barriers to discharge/monitor patient progress toward functional and medical goals  Care Tool:  Bathing  Bathing assist       Upper Body Dressing/Undressing Upper body dressing        Upper body assist      Lower Body Dressing/Undressing Lower body dressing            Lower body assist       Toileting Toileting    Toileting assist       Transfers Chair/bed transfer  Transfers assist           Locomotion Ambulation   Ambulation assist              Walk 10 feet activity   Assist           Walk 50 feet activity   Assist           Walk 150 feet activity   Assist           Walk 10 feet on uneven surface  activity   Assist           Wheelchair     Assist               Wheelchair 50 feet with 2 turns activity    Assist            Wheelchair 150 feet activity     Assist          Blood pressure 115/83, pulse 85, temperature 98 F (36.7 C), resp. rate 17, height 5\' 6"  (1.676 m), weight 68.1 kg, SpO2 95%.  Medical Problem List and Plan: 1. Functional deficits secondary to L PICA/AICA stroke with cerebellar edema              -patient may  shower- if cover incisions             -ELOS/Goals: 10-12 days supervision             Admit to CIR   2.  Antithrombotics: -DVT/anticoagulation:  Pharmaceutical: Heparin             -antiplatelet therapy: Plavix    3. Chronic back pain: kpad ordered. Tylenol as needed             -continue Norco 5/325 one table q HS             -continue Robaxin 500 mg QID   4. Anxiety: lexapro restarted daily at 10mg               -continue trazodone 50 mg q HS             -continue melatonin 10 mg q HS             -antipsychotic agents: n/a   5. Neuropsych/cognition: This patient is? capable of making decisions on her (since sedated, cannot tell) own behalf.   6. Skin/Wound Care: Routine skin care checks             -continue packing of lower aspect of midline incision BID   7. Fluids/Electrolytes/Nutrition: Routine Is and Os and follow-up chemistries             -continue TF and monitor oral intake             -continue D1 diet with honey thick liquids- just upgraded from puree             -SLP eval             -?? 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 Cliffton Asters   11: Hypothyroidism: continue Synthroid   12: GERD: continue Protonix   13: History of esophageal stricture s/p dil   14: History of sleep apnea: no CPAP   15: Left eyelid palsy/irritation: continue patch             -continue Lacrilube, artificial tears   16: Superficial mid-line incisional infection drained at bedside             -wet to dry dressings BID  17. Urinary retention: placed order for foley removal tomorrow  18. Insomnia: trazodone timing changed to 8pm   >50 minutes spent discussing trial of kpad for low back pain, placed order for foley removal tomorrow as she would like one  day to get oriented to the unit prior to foley removal, lexapro restarted daily at 10mg  for anxiety, trazodone timing changed to 8pm     LOS: 1 days A FACE TO FACE EVALUATION WAS PERFORMED  Clint Bolder P Arihaan Bellucci 10/26/2022, 10:34 AM

## 2022-10-26 NOTE — Evaluation (Signed)
Speech Language Pathology Assessment and Plan  Patient Details  Name: Joan Robinson MRN: [id number] Date of Birth: 09/16/1960  SLP Diagnosis: Dysarthria;Cognitive Impairments;Speech and Language deficits;Dysphagia  Rehab Potential: Good ELOS: 2.5-3 weeks   Today's Date: 10/26/2022 SLP Individual Time: 1030-1100 SLP Individual Time Calculation (min): 30 min  Hospital Problem: Principal Problem:   CVA (cerebral vascular accident) [facility])  Past Medical History:  Past Medical History:  Diagnosis Date   Allergic rhinitis    Anemia 10/10/2011   Anxiety and depression 01/17/2007   Qualifier: Diagnosis of  By: Everardo All MD, Sean A    Arthritis 07/24/2013   Likely inflammatory and following with Dr Maryln Gottron of [facility]  rheumatology   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   BCC (basal cell carcinoma of skin) 06/01/2012   Leg Follows with Dr Margo Aye   Bipolar disorder (HCC) 01/17/2007   Qualifier: Diagnosis of  By: Everardo All MD, Sean A    Cataract    Diverticulosis    Diverticulosis    Emphysema of lung (HCC)    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   Gallstones    GERD (gastroesophageal reflux disease)    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   Hashimoto's disease    Hyperlipidemia    Hypertension    Hypothyroidism 08/24/2006   Qualifier: Diagnosis of  By: Everardo All MD, Sean A     IBS (irritable bowel syndrome) 07/27/2016   Obesity 11/01/2013   PUD (peptic ulcer disease)    Sleep apnea 04/27/2016   Tobacco abuse    Past Surgical History:  Past Surgical History:  Procedure Laterality Date   ABDOMINAL HYSTERECTOMY     ABDOMINAL HYSTERECTOMY  01/15/2009   complete   COLONOSCOPY     DILATION AND CURETTAGE OF UTERUS  01/16/1983   EYE SURGERY  03/03/2014   Surgery on both eyes for epiretinal membrane (vitreous peel)   Gated Spect wall motion stress cardiolite  11/05/2001   HIATAL HERNIA REPAIR N/A 07/12/2021   Procedure: LAPAROSCOPY W/ EXTENSIVE FOREGUT DISSECTION; PARTIAL STOMACH REDUCTION;  GASTROSTOMY TUBE PLACEMENT; GASTROPEXY;  Surgeon: Luretha Murphy, MD;  Location: [facility] ORS;  Service: General;  Laterality: N/A;   LAPAROSCOPIC RIGHT HEMI COLECTOMY Right 10/16/2022   Procedure: LAPAROSCOPIC ASSISTED RIGHT HEMI COLECTOMY;  Surgeon: Andria Meuse, MD;  Location: [facility] OR;  Service: General;  Laterality: Right;   POLYPECTOMY     TUBAL LIGATION  01/15/1993   UTERINE SUSPENSION     mesh   VITRECTOMY Bilateral 03/03/2014   XI ROBOTIC ASSISTED HIATAL HERNIA REPAIR N/A 05/01/2021   Procedure: XI ROBOTIC ASSISTED TYPE III HIATAL HERNIA REPAIR WITH FUNDOPLICATION;  Surgeon: Luretha Murphy, MD;  Location: [facility];  Service: General;  Laterality: N/A;    Assessment / Plan / Recommendation Clinical Impression Pt is a 62 year old female with medical hx significant for: diverticulitis (May 2024), gallstones, hiatal hernia repair (2023), HTN, hyperlipidemia, peptic ulcer disease, bipolar disorder, hypothyroidism, GERD, Hashimoto's disease. Pt presented to the ED at [facility] on 10/13/22 d/t acute onset of right-sided abdominal pain, right upper quadrant, right lower quadrant associated with intermittent nausea and vomiting for 2 days prior. CT abdomen/pelvis showed area of narrowing within ascending colon distal to cecum as well as associated area of narrowing in terminal ileum with focal dilatation of cecum and proximal ascending colon as well as small bowel proximal to terminal ileum. Changes are suspicious for internal hernial causing long segment of narrowing and subsequent small-bowel obstruction. Diverticulosis without diverticulitis. NG tube placed. Pt transferred to [facility]  on 9/28. Code stroke activated on 10/14/22 after pt noted to have slurred speech and left side facial droop. CT revealed acute left cerebellar infarct as well as possible involvement in left medulla suspicious for possible acute posterior inferior cerebellar infarct. CTA negative for LVO. Pt transferred  to [facility] for further workup on 10/14/22.  MRI showed large acute infarct affecting both the left PICA and left AICA vascular territories. Repeat CT abdomen/pelvis showed progression of bowel obstruction/cecal volvulus with prominent distension of cecum and focal area of twisting at cecal neck corresponding to zone of obstruction.  Pt underwent laparoscopic assisted right hemicolectomy by Dr. Cliffton Asters on 10/16/22.  Therapy evaluations completed and CIR recommended d/t pt's deficits in functional mobility.   Swallowing: Patient presents with significant impairments to alertness and endurance, poor management of secretions, and poor awareness resulting in unsafe conditions to administer boluses this date. Per thorough chart review, patient exhibiting poor bolus acceptance in acute care setting, however participated in [facility] 10/22/22 which revealed: neurogenic dysphagia characterized by poor bolus awareness and control, anterior loss from the right side, posterior spillage into the pharynx, intermittent spillage of trace amounts of thin and nectar thick liquid into the airway not eliciting any cough response. Given concern for silent aspiration, SLP recommended Dys1/HTL diet, though noted eating would require great effort/concentration, thus recommended continuation of TF nutrition alongside oral diet. During today's assessment, SLP inspected oral cavity noting thick, stringy deep yellow secretions coating the roof of patient's mouth. SLP assisted patient with oral care via suction toothbrush, however soon after oral care initiation, patient triggered into episode of emesis and severe nausea. Patient requested to go to bed, obliged by SLP and daughter. No bolus trials administered. Given inability to assess patient with any boluses, SLP will defer formal diet recommendations until bolus trials can take place. Continue with diet recommendations per acute care SLPs and MBS completed 10/22/22 until formal diet can  be recommended by CIR SLP. Patient should only participate in POs when FULLY AWAKE and ALERT. Continue to provide medications via Cortrak only as patient exhibits poor oral tolerance of crushed medications in puree.   Cognitive-Linguistic: Patient presents with significant impairments to alertness and endurance, resulting poor participation in SLP evaluation, and reduced task completion throughout, resulting in difficulty assessing accurate current cognitive status. Patient exhibits moderate dysarthria primarily characterized by mild hypernasality and moderately imprecise articulation from left sided facial weakness. SLP observed word, phrase, and sentence level speech with sentence level speech decreasing slightly in intelligibility. Intelligibility overall ~85% across limited utterances. Patient demonstrated reduced capacity to follow one-step directions though benefited from provision of verbal, auditory, and tactile cues and models. Difficult to assess receptive/expressive language given limited utterances, however patient seen expressing wants/needs intermittently at the phrase/sentence level throughout session. SLP and daughter briefly discussed patients PLOF with daughter reporting patient was fully independent at baseline.   Patient would benefit from SLP services during CIR admission to target all aforementioned deficits and continue with dynamic assessment to determine existing cognitive-linguistic and swallowing impairments as alertness improves. Daughter is in agreement with plan.     Skilled Therapeutic Interventions          SLP conducted skilled evaluation session to assess swallowing, speech, language, and cognitive function. SLP utilized bedside swallow evaluation, formal family/patient interview, and case history review with full results above.    SLP Assessment  Patient will need skilled Speech Lanaguage Pathology Services during CIR admission    Recommendations  SLP Diet  Recommendations:  (unable to assess PO tolerance, continue diet recommended per acute SLPs until SLP can assess formally) Liquid Administration via: No straw;Cup Medication Administration: Via alternative means Supervision: Staff to assist with self feeding;Full supervision/cueing for compensatory strategies;Trained caregiver to feed patient Compensations: Minimize environmental distractions;Slow rate;Small sips/bites Postural Changes and/or Swallow Maneuvers: Seated upright 90 degrees Oral Care Recommendations: Oral care QID;Staff/trained caregiver to provide oral care Recommendations for Other Services: Neuropsych consult Follow up Recommendations: Home Health SLP Equipment Recommended: None recommended by SLP    SLP Frequency 3 to 5 out of 7 days   SLP Duration  SLP Intensity  SLP Treatment/Interventions 2.5-3 weeks  Minumum of 1-2 x/day, 30 to 90 minutes  Cognitive remediation/compensation;Environmental controls;Multimodal communication approach;Cueing hierarchy;Functional tasks;Therapeutic Activities;Internal/external aids;Therapeutic Exercise;Oral motor exercises;Dysphagia/aspiration precaution training;Patient/family education    Pain Pain Assessment Pain Scale: Faces Faces Pain Scale: Hurts whole lot Pain Location: Abdomen Pain Intervention(s): Alerted LPN to pain and significant nausea  Prior Functioning Cognitive/Linguistic Baseline: Within functional limits Type of Home: House  Lives With: Spouse;Family Available Help at Discharge: Family;Available 24 hours/day Vocation: Retired  Architectural technologist Overall Cognitive Status: Impaired/Different from baseline Arousal/Alertness: Lethargic Orientation Level: Disoriented to time;Disoriented to situation;Oriented to person;Oriented to place Attention: Sustained Focused Attention: Appears intact Sustained Attention: Impaired Sustained Attention Impairment: Functional basic Memory: Impaired Memory Impairment:  Retrieval deficit;Decreased short term memory Decreased Short Term Memory: Functional basic Awareness: Impaired Awareness Impairment: Emergent impairment Problem Solving: Impaired Problem Solving Impairment: Functional basic Executive Function: Self Monitoring;Self Correcting;Decision Making;Initiating;Sequencing;Organizing Sequencing: Impaired Sequencing Impairment: Functional basic Organizing: Impaired Organizing Impairment: Functional basic Decision Making: Impaired Decision Making Impairment: Functional basic Initiating: Impaired Initiating Impairment: Functional basic Self Monitoring: Impaired Self Monitoring Impairment: Functional basic Self Correcting: Impaired Self Correcting Impairment: Functional basic Behaviors: Poor frustration tolerance;Lability;Other (comment) (anxious wit history of anxiety) Safety/Judgment: Impaired Comments: tearful at times, irritable with dauhter (per daughter)  Comprehension Auditory Comprehension Overall Auditory Comprehension: Appears within functional limits for tasks assessed (Difficult to assess given cognitive status and lethargy, will continue to assess) Commands: Impaired One Step Basic Commands: 75-100% accurate Conversation: Simple Interfering Components: Attention;Pain EffectiveTechniques: Extra processing time Visual Recognition/Discrimination Discrimination: Not tested Reading Comprehension Reading Status: Not tested Expression Expression Primary Mode of Expression: Verbal Verbal Expression Overall Verbal Expression: Appears within functional limits for tasks assessed (Functional for tasks assessed, though minimal tasks assessed given overall participation/lethargy) Level of Generative/Spontaneous Verbalization: Sentence Pragmatics: Unable to assess Interfering Components: Attention;Speech intelligibility Non-Verbal Means of Communication: Not applicable Written Expression Dominant Hand: Right Written Expression: Not  tested Oral Motor Oral Motor/Sensory Function Overall Oral Motor/Sensory Function: Moderate impairment Facial ROM: Reduced left Facial Symmetry: Abnormal symmetry left;Suspected CN VII (facial) dysfunction Facial Strength: Reduced left Lingual ROM: Reduced left Lingual Strength: Reduced Velum: Other (comment) (unable to assess, speech mildly hypernasal) Mandible:  (impacted by generalized weakness) Motor Speech Overall Motor Speech: Impaired Respiration: Impaired Level of Impairment: Sentence Phonation: Low vocal intensity Resonance: Hypernasality (mild) Articulation: Impaired Level of Impairment: Word Intelligibility: Intelligibility reduced Word: 75-100% accurate Phrase: 75-100% accurate Sentence: 50-74% accurate Conversation: 50-74% accurate Motor Planning: Witnin functional limits Motor Speech Errors: Consistent Effective Techniques: Slow rate;Increased vocal intensity;Over-articulate  Care Tool Care Tool Cognition Ability to hear (with hearing aid or hearing appliances if normally used Ability to hear (with hearing aid or hearing appliances if normally used): 2. Moderate difficulty - speaker has to increase volume and speak distinctly   Expression of Ideas and Wants Expression of Ideas and Wants: 3. Some difficulty -  exhibits some difficulty with expressing needs and ideas (e.g, some words or finishing thoughts) or speech is not clear   Understanding Verbal and Non-Verbal Content Understanding Verbal and Non-Verbal Content: 3. Usually understands - understands most conversations, but misses some part/intent of message. Requires cues at times to understand  Memory/Recall Ability Memory/Recall Ability : That he or she is in a hospital/hospital unit;Current season   Intelligibility: Intelligibility reduced Word: 75-100% accurate Phrase: 75-100% accurate Sentence: 50-74% accurate Conversation: 50-74% accurate  Bedside Swallowing Assessment General Date of Onset:  10/13/22 Previous Swallow Assessment: MBS completed 10/22/22 Diet Prior to this Study: Dysphagia 1 (pureed);Moderately thick liquids (Level 3, honey thick);Cortrak/Small bore NG tube Temperature Spikes Noted: No Respiratory Status: Room air History of Recent Intubation: Yes Total duration of intubation (days):  (less than 1 day for procedure) Date extubated: 10/16/22 Behavior/Cognition: Cooperative;Lethargic/Drowsy;Distractible;Requires cueing Oral Cavity - Dentition: Poor condition;Missing dentition Self-Feeding Abilities: Needs assist (patient not accepting POs at this time given severe abdominal pain/nausea) Patient Positioning: Upright in chair/Tumbleform Baseline Vocal Quality: Low vocal intensity;Hoarse Volitional Cough: Weak Volitional Swallow:  (not tested, not following commands consistently)  Oral Care Assessment Oral Assessment  (WDL): Exceptions to WDL Lips: Asymmetrical Teeth: Missing (Comment) Tongue: Coated white Mucous Membrane(s): Moist Saliva: Visible dried secretions in oral cavity Level of Consciousness: Alert Is patient on any of following O2 devices?: None of the above Nutritional status: On tube feedings Oral Assessment Risk : High Risk Boluses not administered: See full impressions   BSE Assessment Risk for Aspiration Impact on safety and function: Mild aspiration risk;Risk for inadequate nutrition/hydration Other Related Risk Factors: Deconditioning;Lethargy;Decreased management of secretions  Short Term Goals: Week 1: SLP Short Term Goal 1 (Week 1): Pt. will participate in bolus trials and assessment of diet tolerance of Dys1/HTLs with SLP and exhibit no overt difficulty with boluses given maxA for use of compensatory/safe swallow strategies SLP Short Term Goal 2 (Week 1): Patient will demonstrate increased alertness and overall bolus acceptance in order to demonstrate readiness for repeated objective swallow study over 2 sessions. SLP Short Term Goal 3  (Week 1): Patient will utilize speech intelligibility increasing strategies at the word and phrase levels with maxA in 60% of opportunities. SLP Short Term Goal 4 (Week 1): Patient will exhibit improved endurance/alertness to participate in further cognitive testing in order to determine full extent of cognitive deficits to aid goal setting for inpatient rehabilitation stay.  Refer to Care Plan for Long Term Goals  Recommendations for other services: Neuropsych  Discharge Criteria: Patient will be discharged from SLP if patient refuses treatment 3 consecutive times without medical reason, if treatment goals not met, if there is a change in medical status, if patient makes no progress towards goals or if patient is discharged from hospital.  The above assessment, treatment plan, treatment alternatives and goals were discussed and mutually agreed upon: by patient  Jeannie Done, M.A., CCC-SLP  Yetta Barre 10/26/2022, 1:18 PM

## 2022-10-26 NOTE — Discharge Instructions (Addendum)
Inpatient Rehab Discharge Instructions  Joan Robinson Discharge date and time:  11/13/2022  Activities/Precautions/ Functional Status: Activity: no lifting, driving, or strenuous exercise until cleared by MD Diet: cardiac diet, dysphagia 3>>cut solid food into small pieces and chew thoroughly. Avoid conversation and distractions. Wound Care:  wet to dry dressing changes as directed Functional status:  ___ No restrictions     ___ Walk up steps independently _x__ 24/7 supervision/assistance   ___ Walk up steps with assistance ___ Intermittent supervision/assistance  ___ Bathe/dress independently ___ Walk with walker     ___ Bathe/dress with assistance ___ Walk Independently    ___ Shower independently ___ Walk with assistance    _x__ Shower with assistance _x__ No alcohol     ___ Return to work/school ________  Special Instructions: No driving, alcohol consumption or tobacco use.  Wear eye patch over left eye. Use Lacri-Lube, continue glaucoma eyedrops, and gentle cleansing with warm cloth for Lacri-Lube buildup. Dr. [name] also recommends peri-orbital massage to stimulate muscles.   COMMUNITY REFERRALS UPON DISCHARGE:    Home Health:   PT   OT    SP    RN                  Agency:[facility] Phone:[phone]    Medical Equipment/Items Ordered:WHEELCHAIR HAS OTHER EQUIPMENT FROM OTHER FAMILY MEMBERS                                                 Agency/Supplier:[employer]   [id number]    STROKE/TIA DISCHARGE INSTRUCTIONS SMOKING Cigarette smoking nearly doubles your risk of having a stroke & is the single most alterable risk factor  If you smoke or have smoked in the last 12 months, you are advised to quit smoking for your health. Most of the excess cardiovascular risk related to smoking disappears within a year of stopping. Ask you doctor about anti-smoking medications [place] Quit Line: 1-800-QUIT NOW Free Smoking Cessation Classes [phone]  CHOLESTEROL Know  your levels; limit fat & cholesterol in your diet  Lipid Panel     Component Value Date/Time   CHOL 142 10/15/2022 0228   CHOL 140 10/05/2022 0831   TRIG 60 10/15/2022 0228   HDL 76 10/15/2022 0228   HDL 76 10/05/2022 0831   CHOLHDL 1.9 10/15/2022 0228   VLDL 12 10/15/2022 0228   LDLCALC 54 10/15/2022 0228   LDLCALC 51 10/05/2022 0831     Many patients benefit from treatment even if their cholesterol is at goal. Goal: Total Cholesterol (CHOL) less than 160 Goal:  Triglycerides (TRIG) less than 150 Goal:  HDL greater than 40 Goal:  LDL (LDLCALC) less than 100   BLOOD PRESSURE American Stroke Association blood pressure target is less that 120/80 mm/Hg  Your discharge blood pressure is:  BP: 106/73 Monitor your blood pressure Limit your salt and alcohol intake Many individuals will require more than one medication for high blood pressure  DIABETES (A1c is a blood sugar average for last 3 months) Goal HGBA1c is under 7% (HBGA1c is blood sugar average for last 3 months)  Diabetes: No known diagnosis of diabetes    Lab Results  Component Value Date   HGBA1C 5.6 10/15/2022    Your HGBA1c can be lowered with medications, healthy diet, and exercise. Check your blood sugar as directed by your physician  Call your physician if you experience unexplained or low blood sugars.  PHYSICAL ACTIVITY/REHABILITATION Goal is 30 minutes at least 4 days per week  Activity: Increase activity slowly, Therapies: Physical Therapy: Home Health, Occupational Therapy: Home Health, and Speech Therapy: Home Health Return to work: when cleared by MD Activity decreases your risk of heart attack and stroke and makes your heart stronger.  It helps control your weight and blood pressure; helps you relax and can improve your mood. Participate in a regular exercise program. Talk with your doctor about the best form of exercise for you (dancing, walking, swimming, cycling).  DIET/WEIGHT Goal is to maintain a  healthy weight  Your discharge diet is:  Diet Order             DIET DYS 3 Room service appropriate? Yes; Fluid consistency: Thin  Diet effective now                  Honey thick liquids Your height is:  Height: 5\' 6"  (167.6 cm) Your current weight is: Weight: 64.1 kg Your Body Mass Index (BMI) is:  BMI (Calculated): 22.82 Following the type of diet specifically designed for you will help prevent another stroke. Your goal weight range is:   Your goal Body Mass Index (BMI) is 19-24. Healthy food habits can help reduce 3 risk factors for stroke:  High cholesterol, hypertension, and excess weight.  RESOURCES Stroke/Support Group:  Call [phone]   STROKE EDUCATION PROVIDED/REVIEWED AND GIVEN TO PATIENT Stroke warning signs and symptoms How to activate emergency medical system (call 911). Medications prescribed at discharge. Need for follow-up after discharge. Personal risk factors for stroke. Pneumonia vaccine given: No Flu vaccine given: No My questions have been answered, the writing is legible, and I understand these instructions.  I will adhere to these goals & educational materials that have been provided to me after my discharge from the hospital.     My questions have been answered and I understand these instructions. I will adhere to these goals and the provided educational materials after my discharge from the hospital.  Patient/Caregiver Signature _______________________________ Date __________  Clinician Signature _______________________________________ Date __________  Please bring this form and your medication list with you to all your follow-up doctor's appointments.   [facility]      [facility], [place] [phone]  OPEN ABDOMINAL SURGERY: POST OP INSTRUCTIONS  Always review your discharge instruction sheet given to you by the facility where your surgery was performed.  IF YOU HAVE DISABILITY OR FAMILY LEAVE FORMS, YOU MUST BRING THEM TO THE OFFICE FOR  PROCESSING.  PLEASE DO NOT GIVE THEM TO YOUR DOCTOR.  A prescription for pain medication may be given to you upon discharge.  Take your pain medication as prescribed, if needed.  If narcotic pain medicine is not needed, then you may take acetaminophen (Tylenol) or ibuprofen (Advil) as needed. Take your usually prescribed medications unless otherwise directed. If you need a refill on your pain medication, please contact your pharmacy. They will contact our office to request authorization.  Prescriptions will not be filled after 5pm or on week-ends. You should follow a light diet the first few days after arrival home, such as soup and crackers, pudding, etc.unless your doctor has advised otherwise. A high-fiber, low fat diet can be resumed as tolerated.   Be sure to include lots of fluids daily. Most patients will experience some swelling and bruising on the chest and neck area.  Ice packs will help.  Swelling  and bruising can take several days to resolve Most patients will experience some swelling and bruising in the area of the incision. Ice pack will help. Swelling and bruising can take several days to resolve..  It is common to experience some constipation if taking pain medication after surgery.  Increasing fluid intake and taking a stool softener will usually help or prevent this problem from occurring.  A mild laxative (Milk of Magnesia or Miralax) should be taken according to package directions if there are no bowel movements after 48 hours.  You may have steri-strips (small skin tapes) in place directly over the incision.  These strips should be left on the skin for 7-10 days.  If your surgeon used skin glue on the incision, you may shower in 24 hours.  The glue will flake off over the next 2-3 weeks.  Any sutures or staples will be removed at the office during your follow-up visit. You may find that a light gauze bandage over your incision may keep your staples from being rubbed or pulled. You may  shower and replace the bandage daily. ACTIVITIES:  You may resume regular (light) daily activities beginning the next day--such as daily self-care, walking, climbing stairs--gradually increasing activities as tolerated.  You may have sexual intercourse when it is comfortable.  Refrain from any heavy lifting or straining until approved by your doctor. You may drive when you no longer are taking prescription pain medication, you can comfortably wear a seatbelt, and you can safely maneuver your car and apply brakes Return to Work: ___________________________________ Bonita Quin should see your doctor in the office for a follow-up appointment approximately two weeks after your surgery.  Make sure that you call for this appointment within a day or two after you arrive home to insure a convenient appointment time. OTHER INSTRUCTIONS:  _____________________________________________________________ _____________________________________________________________  WHEN TO CALL YOUR DOCTOR: Fever over 101.0 Inability to urinate Nausea and/or vomiting Extreme swelling or bruising Continued bleeding from incision. Increased pain, redness, or drainage from the incision. Difficulty swallowing or breathing Muscle cramping or spasms. Numbness or tingling in hands or feet or around lips.  The clinic staff is available to answer your questions during regular business hours.  Please don't hesitate to call and ask to speak to one of the nurses if you have concerns.  For further questions, please visit [email or web address]  Wet to Dry WOUND CARE: - Change dressing twice daily - Supplies: sterile saline, kerlex, scissors, ABD pads, tape  Remove dressing and all packing carefully, moistening with sterile saline as needed to avoid packing/internal dressing sticking to the wound. 2.   Clean edges of skin around the wound with water/gauze, making sure there is no tape debris or leakage left on skin that could cause  skin irritation or breakdown. 3.   Dampen and clean kerlex with sterile saline and pack wound from wound base to skin level, making sure to take note of any possible areas of wound tracking, tunneling and packing appropriately. Wound can be packed loosely. Trim kerlex to size if a whole kerlex is not required. 4.   Cover wound with a dry ABD pad and secure with tape.  5.   Write the date/time on the dry dressing/tape to better track when the last dressing change occurred. - apply any skin protectant/powder if recommended by clinician to protect skin/skin folds. - change dressing as needed if leakage occurs, wound gets contaminated, or patient requests to shower. - You may shower daily with wound open and following  the shower the wound should be dried and a clean dressing placed.  - Medical grade tape as well as packing supplies can be found at [employer] on [address] or [employer] on [address]. The remaining supplies can be found at your local drug store, [employer] etc.

## 2022-10-27 DIAGNOSIS — G4700 Insomnia, unspecified: Secondary | ICD-10-CM | POA: Diagnosis not present

## 2022-10-27 DIAGNOSIS — R339 Retention of urine, unspecified: Secondary | ICD-10-CM | POA: Diagnosis not present

## 2022-10-27 DIAGNOSIS — I639 Cerebral infarction, unspecified: Secondary | ICD-10-CM | POA: Diagnosis not present

## 2022-10-27 MED ORDER — MELATONIN 5 MG PO TABS
10.0000 mg | ORAL_TABLET | Freq: Every day | ORAL | Status: DC
  Administered 2022-10-27 – 2022-11-12 (×17): 10 mg via ORAL
  Filled 2022-10-27 (×17): qty 2

## 2022-10-27 NOTE — Progress Notes (Signed)
Last evening patient was having right sided droop with her lips this evening having left sided droop with her lips. Is alert and oriented. Agreeable to allowing staff to assist with intermittent catheter this evening. Explained risk of retaining urine. Encouraged patient to void on her own and asked if felt the need to void but did not. Bladder scanned for >222 mL of fluids and collected was 400 mL of fluids. Incision site where sutures were removed expanding in size.

## 2022-10-27 NOTE — Progress Notes (Signed)
PROGRESS NOTE   Subjective/Complaints:  Pt doing ok, didn't sleep well although humidifier did help but she usually takes melatonin at night and it wasn't given. Readded melatonin.  Denies pain.  LBM yesterday-- had diarrhea before she got here, on colace, and wants to avoid that.  Has foley in place, no issues with that.  Denies any other complaints or concerns today.   ROS: +anxiety. See HPI. Denies CP, SOB, abd pain, n/v/d/c   Objective:   No results found. Recent Labs    10/26/22 1025  WBC 11.8*  HGB 12.4  HCT 37.2  PLT 396   Recent Labs    10/26/22 1025  NA 133*  K 5.1  CL 99  CO2 23  GLUCOSE 137*  BUN 22  CREATININE 0.74  CALCIUM 9.2    Intake/Output Summary (Last 24 hours) at 10/27/2022 0936 Last data filed at 10/27/2022 1610 Gross per 24 hour  Intake 1179.17 ml  Output 1100 ml  Net 79.17 ml        Physical Exam: Vital Signs Blood pressure (!) 100/48, pulse 88, temperature (!) 97.5 F (36.4 C), temperature source Oral, resp. rate 17, height 5\' 6"  (1.676 m), weight 68.1 kg, SpO2 100%. Constitutional:      Comments: L eyepatch in place, NAD HENT:     Head: Normocephalic and atraumatic.     Comments: L facial droop L eye will not close- covered with eyepatch Cortrak in place- TF's off    Right Ear: External ear normal.     Left Ear: External ear normal.     Nose: Nose normal. No congestion.     Mouth/Throat:     Mouth: Mucous membranes are dry.     Pharynx: Oropharynx is clear. No oropharyngeal exudate.      Eyes:     Comments: Left lower lid everted and erythematous-- covered with patch, not reassessed today Cardiovascular:     Rate and Rhythm: Normal rate and regular rhythm.     Heart sounds: Normal heart sounds. No murmur heard.    No gallop.  Pulmonary: CTAB in all lung fields, no w/r/r, no hypoxia or increased WOB, speaking in full sentences Abdominal:     General: Bowel sounds  are normal.     Comments: Dressing over midline incision intact- just changed C/D/I +BS throughout, soft, nonTTP, nondistended.  Genitourinary:    Comments: Foley- mildly amber urine  PRIOR EXAMS: Musculoskeletal:     Cervical back: Neck supple. No tenderness.     Comments: Moving legs both extremely well and RUE- not spontaneously moving LUE much, however is asleep  Skin:    General: Skin is warm and dry.     Comments: IV L AC fossa and R forearm- look OK Sweating on face slightly- appears warm, but doesn't feel feverish  Neurological:     Comments: Somnolent- loudly snoring- cannot get her to wake up for more than ~ 30 seconds.  GU: foley in place  Assessment/Plan: 1. Functional deficits which require 3+ hours per day of interdisciplinary therapy in a comprehensive inpatient rehab setting. Physiatrist is providing close team supervision and 24 hour management of active medical problems listed below. Physiatrist  and rehab team continue to assess barriers to discharge/monitor patient progress toward functional and medical goals  Care Tool:  Bathing    Body parts bathed by patient: Left arm, Chest, Abdomen, Face   Body parts bathed by helper: Right arm, Left lower leg, Right lower leg, Buttocks, Front perineal area, Right upper leg, Left upper leg     Bathing assist Assist Level: Maximal Assistance - Patient 24 - 49%     Upper Body Dressing/Undressing Upper body dressing   What is the patient wearing?: Pull over shirt    Upper body assist Assist Level: Moderate Assistance - Patient 50 - 74%    Lower Body Dressing/Undressing Lower body dressing      What is the patient wearing?: Pants     Lower body assist Assist for lower body dressing: Total Assistance - Patient < 25%     Toileting Toileting Toileting Activity did not occur (Clothing management and hygiene only): N/A (no void or bm)  Toileting assist       Transfers Chair/bed transfer  Transfers assist      Chair/bed transfer assist level: Moderate Assistance - Patient 50 - 74%     Locomotion Ambulation   Ambulation assist      Assist level: 2 helpers Assistive device: Walker-rolling Max distance: 34ft   Walk 10 feet activity   Assist     Assist level: 2 helpers Assistive device: Walker-rolling   Walk 50 feet activity   Assist Walk 50 feet with 2 turns activity did not occur: Safety/medical concerns (fatigue)         Walk 150 feet activity   Assist Walk 150 feet activity did not occur: Safety/medical concerns         Walk 10 feet on uneven surface  activity   Assist Walk 10 feet on uneven surfaces activity did not occur: Safety/medical concerns         Wheelchair     Assist Is the patient using a wheelchair?: Yes Type of Wheelchair: Manual    Wheelchair assist level: Dependent - Patient 0% Max wheelchair distance: 70ft    Wheelchair 50 feet with 2 turns activity    Assist        Assist Level: Dependent - Patient 0%   Wheelchair 150 feet activity     Assist      Assist Level: Dependent - Patient 0%   Blood pressure (!) 100/48, pulse 88, temperature (!) 97.5 F (36.4 C), temperature source Oral, resp. rate 17, height 5\' 6"  (1.676 m), weight 68.1 kg, SpO2 100%.  Medical Problem List and Plan: 1. 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  Antithrombotics: -DVT/anticoagulation:  Pharmaceutical: Heparin 5000U q8h             -antiplatelet therapy: Plavix 75mg  daily   3. Chronic back pain: kpad ordered. Tylenol as needed             -continue Norco 5/325 one tablet q HS             -continue Robaxin 500 mg QID   4. Anxiety: lexapro restarted daily at 10mg               -  continue trazodone 50 mg q HS-- timing changed to 8pm -continue melatonin 10 mg q HS--  d/c'd but unclear why, not sleeping well, reordered on 10/27/22             -antipsychotic agents: n/a   5. Neuropsych/cognition: This patient is? capable of making decisions on her (since sedated, cannot tell) own behalf.   6. Skin/Wound Care: Routine skin care checks             -continue packing of lower aspect of midline incision BID   7. Fluids/Electrolytes/Nutrition: Routine Is and Os and follow-up chemistries             -continue TF and monitor oral intake             -continue D1 diet with honey thick liquids- just upgraded from puree             -SLP eval             -?? 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 Urinary retention: placed order for foley removal 10/27/22  -10/27/22 foley ordered to be removed today, ordered PVRs to monitor  18. Leukocytosis: noted on 10/26/22, WBC 11.8; afebrile, monitor for s/sx of infection     LOS: 2 days A FACE TO FACE EVALUATION WAS PERFORMED  [address] 10/27/2022, 9:36 AM

## 2022-10-27 NOTE — Progress Notes (Signed)
Periodically throughout the evening and night expressed "I can't breathe". Repositioned in bed and endorsed relief/improvement. Vitals obtained during times making these statements and lung sounds assessed. Continues to have a strong congested non-productive cough. Explains mouth feeling dry and stating "feels like it is on fire". Mouth suctioned swabbed and swabbed to relieve sensation of dryness experiencing. Additionally, reports sensation of "burning up", which according to patient has been an ongoing symptom for her. Upper extremities are colder than earlier this evening, but able to assess for pulses. No change with sensation.

## 2022-10-27 NOTE — Progress Notes (Signed)
Speech Language Pathology Daily Session Note  Patient Details  Name: Joan Robinson MRN: [id number] Date of Birth: 11-20-1960  Today's Date: 10/27/2022 SLP Individual Time: 1000-1042 SLP Individual Time Calculation (min): 42 min  Short Term Goals: Week 1: SLP Short Term Goal 1 (Week 1): Pt. will participate in bolus trials and assessment of diet tolerance of Dys1/HTLs with SLP and exhibit no overt difficulty with boluses given maxA for use of compensatory/safe swallow strategies SLP Short Term Goal 2 (Week 1): Patient will demonstrate increased alertness and overall bolus acceptance in order to demonstrate readiness for repeated objective swallow study over 2 sessions. SLP Short Term Goal 3 (Week 1): Patient will utilize speech intelligibility increasing strategies at the word and phrase levels with maxA in 60% of opportunities. SLP Short Term Goal 4 (Week 1): Patient will exhibit improved endurance/alertness to participate in further cognitive testing in order to determine full extent of cognitive deficits to aid goal setting for inpatient rehabilitation stay.  Skilled Therapeutic Interventions: SLP conducted skilled therapy session targeting dysphagia management strategies. SLP received patient reclined in bed, sleeping, with daughter present. Daughter is a Engineer, civil (consulting) and reports she just finished assisting mother with oral care prior to SLP entry. SLP and daughter repositioned patient into upright position to ensure safety for PO intake. Patient only transiently awake when roused. SLP presented patient with boluses of honey thick liquids x2 (half-teaspoons) with patient benefiting from total cues for labial seal and movement of bolus into the mouth during brief periods of wakefulness. Once in the mouth, no swallow was initiated resulted in eventual removal with suction. SLP provided patient with small ice chip x2. Patient elicited swallow with melted ice x1 and was observed to chew x1. Per reports from  daughter, other therapists, and nursing, patient's alertness waxes and wanes significantly with patient swallowing more readily during periods of alertness. SLP will change diet to NPO given unpredictability of alertness, however recommend administration of puree/HTLs when patient is fully awake per safe swallowing recommendations given at most recent objective swallow study. Nursing alerted to changes, daughter in agreement. Patient was left in lowered bed with call bell in reach and bed alarm set. SLP will continue to target goals per plan of care.       Pain Pain Assessment Pain Scale: Faces Pain Score: 0-No pain  Therapy/Group: Individual Therapy  Jeannie Done, M.A., CCC-SLP   Yetta Barre 10/27/2022, 2:07 PM

## 2022-10-28 ENCOUNTER — Inpatient Hospital Stay (HOSPITAL_COMMUNITY): Payer: BC Managed Care – PPO

## 2022-10-28 DIAGNOSIS — R110 Nausea: Secondary | ICD-10-CM

## 2022-10-28 DIAGNOSIS — I639 Cerebral infarction, unspecified: Secondary | ICD-10-CM | POA: Diagnosis not present

## 2022-10-28 DIAGNOSIS — R339 Retention of urine, unspecified: Secondary | ICD-10-CM | POA: Diagnosis not present

## 2022-10-28 DIAGNOSIS — G4700 Insomnia, unspecified: Secondary | ICD-10-CM | POA: Diagnosis not present

## 2022-10-28 MED ORDER — MECLIZINE HCL 25 MG PO TABS
12.5000 mg | ORAL_TABLET | Freq: Three times a day (TID) | ORAL | Status: DC | PRN
  Administered 2022-10-28 – 2022-10-29 (×2): 12.5 mg via ORAL
  Filled 2022-10-28 (×2): qty 1

## 2022-10-28 NOTE — Significant Event (Signed)
Nursing called provider to bedside after PT witnessed transient maximal pupillary dilation in the right eye when getting patient back in bed.  Seem to fluctuate and then self resolved.  Patient showed no other focal deficits and was vitally stable on assessment.  Evaluated patient at bedside, daughter and son in room.  Patient endorses a mild, generalized headache which has been intermittent over the past 2 days, sometimes frontal and sometimes posterior.  She also endorses overall worsening dizziness and nausea, which has been progressive over the last 48 hours.  She denies any emesis, new weakness, new sensory deficits, or vision changes.  Vitals:   10/27/22 2100 10/28/22 0604  BP: 108/68 (!) 104/57  Pulse:  81  Resp:  20  Temp:  97.9 F (36.6 C)  SpO2:  100%    Exam: No acute distress.  Eyes:  Left pupil round but Doose diameter and reactivity compared to right, injected conjunctival, cannot fully close eye. Right pupil round and reactive to light, appears normal. Visual fields intact. EOMI difficult to the left, but fully intact..  Neuro: Alert and oriented x 3.  Mildly lethargic, does fall asleep shortly after exam. Moving all 4 extremities antigravity and against resistance, mild lower extremity weakness 4 out of 5 laterally, otherwise no appreciable motor deficits. Sensation light touch intact in all 4 extremities. CN + right facial droop +L pupil as above  Plan:  Reviewed imaging, prior MRI noted mass effect with inferior displacement of the cerebellar tonsils, 4 mm.  Not mentioned on last 2 CT scans, but on personal review does not appear much changed.  She does have a significant amount of edema and mass effect in the generalized area, and some ventricular effacement.  Possible that transient unilateral pupillary dilation could indicate worsening edema and possible uncal herniation.  -Obtain stat CT head to evaluate for possible cerebellar herniation -If any significant  changes beyond what was seen on prior imaging, will urgently consult neurosurgery for evaluation.  If any further neurologic changes prior to obtaining imaging, would consult neurosurgery as well. -If no focal findings on CT and no signs of progressive neurologic deficits or symptoms, would defer to primary team for neurology consult if vision changes recur -Reviewed medications; meclizine started today, otherwise no recent medication changes, although multiple could potentially affect pupillary dilation   Angelina Sheriff, DO 10/28/2022

## 2022-10-28 NOTE — Plan of Care (Signed)
  Problem: Self-Care: Goal: Ability to participate in self-care as condition permits will improve Outcome: Progressing Goal: Verbalization of feelings and concerns over difficulty with self-care will improve Outcome: Progressing Goal: Ability to communicate needs accurately will improve Outcome: Progressing   Problem: Nutrition: Goal: Dietary intake will improve Outcome: Progressing

## 2022-10-28 NOTE — Progress Notes (Signed)
Physical Therapy Session Note  Patient Details  Name: Joan Robinson MRN: [id number] Date of Birth: 1960/03/11  Today's Date: 10/28/2022 PT Individual Time: 4782-9562; 245-330 PT Individual Time Calculation (min): 63 min ; 45 min  Short Term Goals: Week 1:  PT Short Term Goal 1 (Week 1): Pt will complete bed mobility with CGA PT Short Term Goal 2 (Week 1): Pt will complete bed<>chair transfers with CGA and LRAD PT Short Term Goal 3 (Week 1): Pt will ambulate 137ft with minA and LRAD  Skilled Therapeutic Interventions/Progress Updates:   Session 1:Pt received supine in bed with nursing present giving meds. Pt agreeable to PT tx. No complaints of pain. Therapeutic Activity: Bed mobility Supervision; Sitting balance EOB with CGA to SBA for sitting balance with PT encouraging pt to look to each side and maintain balance without PT support; STS CGA to min A from EOB and wheelchair multiple repetitions for functional strengthening and for technique. Standing tolerance with RW CGA up to 3 min multiple bouts for functional strengthening and to assist with ADLs. [place] ADLs continued with PT CGA to min A to reach to each side for functional strengthening and balance; Pt with multiple complaints of nausea with nursing notified. Nursing came to address and PA came for routine assessment with nursing mentioning to her.   Pt left in recliner with legs elevated. Seat belt alarm on. Friends present. No complaints of pain. All activities focused on function, activity tolerance seated and standing, and functional balance.   Session 2: Pt received in bed with visitors present. Pt agreeable to PT tx. No complaints of pain.  Therex: Supine to sit SBA. STS min A from EOB with RW with PT verbal cues for hand placement x 4. Standing shoulder flex/ext x 19 performed each arm prior to pt reports of nausea as earlier tx. Nursing made aware. Sit to supine SBA. After a rest break, supine to sit performed with SBA and  pt put her hands on her head and stated she could not walk and did not want to do any seated or standing therex due to nausea and feeling like "the room is spinning." Sit to supine SBA.  Pt agreeable to supine therex: bridge x 20, SLR x 20, heel slides x 20, clam shell x 20, pillow squeeze x 20, hip abduction/adduction x 20. Pt noted to have dilated pupils esp in R eye; nursing notified and agreed. They are to contact MD.   Pt left in bed, bed alarm on, nursing present assessing further vitals and contacting MD. No c/o pain, only nausea of which nursing administered another round of meds.     Therapy Documentation Precautions:  Precautions Precautions: Fall Precaution Comments: 02, cortrak, indwelling cath, vision deficit, ataxia Restrictions Weight Bearing Restrictions: No    Therapy/Group: Individual Therapy  Luna Fuse 10/28/2022, 7:15 AM

## 2022-10-28 NOTE — Progress Notes (Signed)
Speech Language Pathology Daily Session Note  Patient Details  Name: Joan Robinson MRN: [id number] Date of Birth: [birth year]  Today's Date: 10/28/2022 SLP Individual Time: 1350-1430 SLP Individual Time Calculation (min): 40 min  Short Term Goals: Week 1: SLP Short Term Goal 1 (Week 1): Pt. will participate in bolus trials and assessment of diet tolerance of Dys1/HTLs with SLP and exhibit no overt difficulty with boluses given maxA for use of compensatory/safe swallow strategies SLP Short Term Goal 2 (Week 1): Patient will demonstrate increased alertness and overall bolus acceptance in order to demonstrate readiness for repeated objective swallow study over 2 sessions. SLP Short Term Goal 3 (Week 1): Patient will utilize speech intelligibility increasing strategies at the word and phrase levels with maxA in 60% of opportunities. SLP Short Term Goal 4 (Week 1): Patient will exhibit improved endurance/alertness to participate in further cognitive testing in order to determine full extent of cognitive deficits to aid goal setting for inpatient rehabilitation stay.  Skilled Therapeutic Interventions:  Pt was seen for skilled ST targeting goals for dysphagia.  Pt was awake upon arrival and agreeable to participating in treatment.  SLP facilitated the session with trials of honey thick liquids via teaspoon following oral care to continue working towards initiation of PO diet.  Pt accepted boluses and minimized anterior spillage of liquids with "slurping" pattern. No overt s/s of aspiration with thickened liquids and pt's vocal quality remained clear during trials.  Pt politely declined trials of purees.  Pt verbalized frustration over her lethargy and difficulty maintaining alertness, stating she doesn't understand why she is always so tired.  SLP provided skilled education regarding location of stroke, specifically brain stem involvement, as a contributing factor to her decreased arousal, in addition to  her other comorbidities following abdominal surgery.  Pt verbalized understanding.  Pt left in bed with bed alarm set and family at bedside. Continue per current plan of care.    Pain Pain Assessment Pain Scale: 0-10 Pain Score: 0-No pain  Therapy/Group: Individual Therapy  Breeanne Oblinger, Melanee Spry 10/28/2022, 4:04 PM

## 2022-10-28 NOTE — Progress Notes (Signed)
PROGRESS NOTE   Subjective/Complaints:  Pt doing ok, slept alright. Having some nausea today, seems to be mostly with movement or transitions, thinks her brain and body haven't "reconnected" and so she gets nauseated. Agreeable with trying meclizine. Not really clear on if she gets dizzy, just seems to get nauseated with transitions.  Denies pain.  LBM this morning.  Foley removed yesterday, needed cath one time overnight, but has also been able to void independently.  Denies any other complaints or concerns today.   ROS: +anxiety. See HPI. Denies CP, SOB, abd pain, n/v/d/c   Objective:   No results found. Recent Labs    10/26/22 1025  WBC 11.8*  HGB 12.4  HCT 37.2  PLT 396   Recent Labs    10/26/22 1025  NA 133*  K 5.1  CL 99  CO2 23  GLUCOSE 137*  BUN 22  CREATININE 0.74  CALCIUM 9.2    Intake/Output Summary (Last 24 hours) at 10/28/2022 1052 Last data filed at 10/27/2022 2230 Gross per 24 hour  Intake --  Output 800 ml  Net -800 ml        Physical Exam: Vital Signs Blood pressure (!) 104/57, pulse 81, temperature 97.9 F (36.6 C), temperature source Oral, resp. rate 20, height 5\' 6"  (1.676 m), weight 68.1 kg, SpO2 100%. Constitutional:      Comments: in w/c with PT, NAD HENT:     Head: Normocephalic and atraumatic.     Comments: L facial droop L eye will not close- lower eyelid droops, no patch this morning Cortrak in place- TF's off    Right Ear: External ear normal.     Left Ear: External ear normal.     Nose: Nose normal. No congestion.     Mouth/Throat:     Mouth: Mucous membranes are dry.     Pharynx: Oropharynx is clear. No oropharyngeal exudate.      Eyes:     Comments: Left lower lid everted and erythematous-- patch removed today Cardiovascular:     Rate and Rhythm: Normal rate and regular rhythm.     Heart sounds: Normal heart sounds. No murmur heard.    No gallop.  Pulmonary:  CTAB in all lung fields, no w/r/r, no hypoxia or increased WOB, speaking in full sentences Abdominal:     General: Bowel sounds are normal.     Comments: Dressing over midline incision intact- just changed C/D/I +BS throughout, soft, nonTTP, nondistended.  Neuro: L facial droop, alert, some memory deficits (couldn't recall being cath'd)  PRIOR EXAMS: Musculoskeletal:     Cervical back: Neck supple. No tenderness.     Comments: Moving legs both extremely well and RUE- not spontaneously moving LUE much, however is asleep  Skin:    General: Skin is warm and dry.     Comments: IV L AC fossa and R forearm- look OK Sweating on face slightly- appears warm, but doesn't feel feverish  Neurological:     Comments: Somnolent- loudly snoring- cannot get her to wake up for more than ~ 30 seconds.    Assessment/Plan: 1. Functional deficits which require 3+ hours per day of interdisciplinary therapy in a comprehensive  inpatient rehab setting. Physiatrist is providing close team supervision and 24 hour management of active medical problems listed below. Physiatrist and rehab team continue to assess barriers to discharge/monitor patient progress toward functional and medical goals  Care Tool:  Bathing    Body parts bathed by patient: Left arm, Chest, Abdomen, Face   Body parts bathed by helper: Right arm, Left lower leg, Right lower leg, Buttocks, Front perineal area, Right upper leg, Left upper leg     Bathing assist Assist Level: Maximal Assistance - Patient 24 - 49%     Upper Body Dressing/Undressing Upper body dressing   What is the patient wearing?: Pull over shirt    Upper body assist Assist Level: Moderate Assistance - Patient 50 - 74%    Lower Body Dressing/Undressing Lower body dressing      What is the patient wearing?: Pants     Lower body assist Assist for lower body dressing: Total Assistance - Patient < 25%     Toileting Toileting Toileting Activity did not occur  (Clothing management and hygiene only): N/A (no void or bm)  Toileting assist Assist for toileting: Moderate Assistance - Patient 50 - 74%     Transfers Chair/bed transfer  Transfers assist  Chair/bed transfer activity did not occur: Safety/medical concerns  Chair/bed transfer assist level: Moderate Assistance - Patient 50 - 74%     Locomotion Ambulation   Ambulation assist      Assist level: 2 helpers Assistive device: Walker-rolling Max distance: 82ft   Walk 10 feet activity   Assist     Assist level: 2 helpers Assistive device: Walker-rolling   Walk 50 feet activity   Assist Walk 50 feet with 2 turns activity did not occur: Safety/medical concerns (fatigue)         Walk 150 feet activity   Assist Walk 150 feet activity did not occur: Safety/medical concerns         Walk 10 feet on uneven surface  activity   Assist Walk 10 feet on uneven surfaces activity did not occur: Safety/medical concerns         Wheelchair     Assist Is the patient using a wheelchair?: Yes Type of Wheelchair: Manual    Wheelchair assist level: Dependent - Patient 0% Max wheelchair distance: 67ft    Wheelchair 50 feet with 2 turns activity    Assist        Assist Level: Dependent - Patient 0%   Wheelchair 150 feet activity     Assist      Assist Level: Dependent - Patient 0%   Blood pressure (!) 104/57, pulse 81, temperature 97.9 F (36.6 C), temperature source Oral, resp. rate 20, height 5\' 6"  (1.676 m), weight 68.1 kg, SpO2 100%.  Medical Problem List and Plan: 1. 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  Antithrombotics: -DVT/anticoagulation:  Pharmaceutical: Heparin 5000U q8h             -antiplatelet therapy: Plavix 75mg  daily   3. Chronic back pain: kpad ordered.  Tylenol as needed             -continue Norco 5/325 one tablet q HS             -  continue Robaxin 500 mg QID   4. Anxiety: lexapro restarted daily at 10mg               -continue trazodone 50 mg q HS-- timing changed to 8pm -continue melatonin 10 mg q HS-- d/c'd but unclear why, not sleeping well, reordered on 10/27/22             -antipsychotic agents: n/a   5. Neuropsych/cognition: This patient is? capable of making decisions on her (since sedated, cannot tell) own behalf.   6. Skin/Wound Care: Routine skin care checks             -continue packing of lower aspect of midline incision BID   7. Fluids/Electrolytes/Nutrition: Routine Is and Os and follow-up chemistries             -continue TF and monitor oral intake             -continue D1 diet with honey thick liquids- just upgraded from puree             -SLP eval             -?? 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 Urinary retention: placed order for foley removal 10/27/22  -10/27/22 foley ordered to be removed today, ordered PVRs to monitor -10/28/22 foley removed, one cath overnight for but otherwise low PVRs--monitor  18. Leukocytosis: noted on 10/26/22, WBC 11.8; afebrile, monitor for s/sx of infection  19. Nausea: seems to be mostly with  movement/transitions, has zofran ordered but will trial meclizine to see if this helps with the movement aspect (pt doesn't state she's dizzy but how she describes her symptoms seems to have a rotational/movement component); started with 12.5mg  TID PRN, could always go up also.   LOS: 3 days A FACE TO FACE EVALUATION WAS PERFORMED  [address] Hilery Wintle 10/28/2022, 10:52 AM

## 2022-10-28 NOTE — Progress Notes (Signed)
Occupational Therapy Session Note  Patient Details  Name: Joan Robinson MRN: [id number] Date of Birth: [birth year]  Today's Date: 10/28/2022 OT Individual Time: 1005-1100 OT Individual Time Calculation (min): 55 min    Short Term Goals: Week 1:  OT Short Term Goal 1 (Week 1): Patient will maintain arousal x 45 min during BADL activity OT Short Term Goal 2 (Week 1): Patient will complete upper boody bathing with min assist OT Short Term Goal 3 (Week 1): Patient will complete lower body bathing with mod assist OT Short Term Goal 4 (Week 1): Patient will don/doff pull over shirt with min assist OT Short Term Goal 5 (Week 1): Patient will don pants with mod assist  Skilled Therapeutic Interventions/Progress Updates:    Patient received in recliner.  Patient lethargic, but agreeable to OT.  Patient emotional at times - tearful, and frustrated.  Patient transported to gym.  O2 at 100% on 2l via [place].  O2 removed for OT session - and at end of session 97%.   Transported patient to gym.  Patient reports walking earlier in PT session and feeling nauseated as she walked.  Patient closing eyes often for transfers.  Patient asked to compare eyes open and eyes closed with sit to stand.  No appreciable difference if patient did not move very rapidly.  BP taken in seated and standing - minor drop in systolic pressure from seated to standing position.  No increase in symptoms.   Session focused on vision.  Needing a better picture of prior visual deficits.  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  Patient reports left eye was degrading prior to stroke, and R eye well controlled with one contact.  Patient is not wearing contact here - this may be helpful for sharpening her focused vision.  Family will be asked to bring in R contact.  Patient wearing patch over left eye.  Patient reports large floaters in Left eye, and inability to obtain  eye closure.  Patch reduces floaters.  Patient with significant facil weakness and drooping.  Poor sensation left aspect of face.   Patient states not being able to see here - but able to describe several items in visual field in quiet gym.  Patient states not being able to read - but able to discern color, suit and numbers on playing cards, and able to read large to small print with glasses on.   Patient returned to room, and transferred to bed to rest prior to SLP session.  Patient able to doff shoes and don socks with increased time using both hands.  Call bell in reach, Bed alarm engaged and friends at bedside.    Therapy Documentation Precautions:  Precautions Precautions: Fall Precaution Comments: 02, cortrak, indwelling cath, vision deficit, ataxia Restrictions Weight Bearing Restrictions: No   Vital Signs:  98/61 seated.  103/70 - seated,  98/70 standing  Pain:  Denies pain- reports soreness in abdomen when asked    Therapy/Group: Individual Therapy  [name] 10/28/2022, 12:23 PM

## 2022-10-29 ENCOUNTER — Inpatient Hospital Stay (HOSPITAL_COMMUNITY): Payer: BC Managed Care – PPO

## 2022-10-29 DIAGNOSIS — I639 Cerebral infarction, unspecified: Secondary | ICD-10-CM | POA: Diagnosis not present

## 2022-10-29 LAB — URINALYSIS, ROUTINE W REFLEX MICROSCOPIC
Bilirubin Urine: NEGATIVE
Glucose, UA: NEGATIVE mg/dL
Hgb urine dipstick: NEGATIVE
Ketones, ur: NEGATIVE mg/dL
Nitrite: NEGATIVE
Protein, ur: 100 mg/dL — AB
Specific Gravity, Urine: 1.021 (ref 1.005–1.030)
pH: 8 (ref 5.0–8.0)

## 2022-10-29 LAB — BASIC METABOLIC PANEL
Anion gap: 10 (ref 5–15)
BUN: 30 mg/dL — ABNORMAL HIGH (ref 8–23)
CO2: 27 mmol/L (ref 22–32)
Calcium: 9 mg/dL (ref 8.9–10.3)
Chloride: 96 mmol/L — ABNORMAL LOW (ref 98–111)
Creatinine, Ser: 0.87 mg/dL (ref 0.44–1.00)
GFR, Estimated: >60 mL/min (ref >60)
Glucose, Bld: 127 mg/dL — ABNORMAL HIGH (ref 70–99)
Potassium: 4.9 mmol/L (ref 3.5–5.1)
Sodium: 133 mmol/L — ABNORMAL LOW (ref 135–145)

## 2022-10-29 LAB — CBC
HCT: 35.7 % — ABNORMAL LOW (ref 36.0–46.0)
Hemoglobin: 11.8 g/dL — ABNORMAL LOW (ref 12.0–15.0)
MCH: 29.8 pg (ref 26.0–34.0)
MCHC: 33.1 g/dL (ref 30.0–36.0)
MCV: 90.2 fL (ref 80.0–100.0)
Platelets: 483 10*3/uL — ABNORMAL HIGH (ref 150–400)
RBC: 3.96 MIL/uL (ref 3.87–5.11)
RDW: 13.7 % (ref 11.5–15.5)
WBC: 8.8 10*3/uL (ref 4.0–10.5)
nRBC: 0 % (ref 0.0–0.2)

## 2022-10-29 MED ORDER — SULFAMETHOXAZOLE-TRIMETHOPRIM 800-160 MG PO TABS
1.0000 | ORAL_TABLET | Freq: Two times a day (BID) | ORAL | Status: AC
  Administered 2022-10-29 – 2022-11-01 (×6): 1 via ORAL
  Filled 2022-10-29 (×6): qty 1

## 2022-10-29 MED ORDER — OSMOLITE 1.5 CAL PO LIQD
480.0000 mL | ORAL | Status: DC
  Administered 2022-10-29: 480 mL
  Filled 2022-10-29: qty 711

## 2022-10-29 MED ORDER — ONDANSETRON HCL 4 MG/2ML IJ SOLN
4.0000 mg | Freq: Every day | INTRAMUSCULAR | Status: DC
  Administered 2022-10-29 – 2022-11-02 (×5): 4 mg via INTRAVENOUS
  Filled 2022-10-29 (×5): qty 2

## 2022-10-29 NOTE — Progress Notes (Signed)
Patient ID: Joan Robinson, female   DOB: 16-Apr-1960, 62 y.o.   MRN: [id number] Met with the patient and daughter to review current situation, rehab schedule, team conference and plan of care. Discussed secondary risk management including HTN, Hashimotos' disease, HLD OSA, on DAPT. Reviewed medications  and dietary modification recommendations; currently maintained on D1 honey diet with cortrak for TF at HS. Continue to follow along to address educational needs to facilitate preparation for discharge. Pamelia Hoit

## 2022-10-29 NOTE — Progress Notes (Signed)
Occupational Therapy Session Note  Patient Details  Name: Joan Robinson MRN: [id number] Date of Birth: 1960/03/15  Today's Date: 10/29/2022 OT Individual Time: 0935-1000 OT Individual Time Calculation (min): 25 min    Short Term Goals: Week 1:  OT Short Term Goal 1 (Week 1): Patient will maintain arousal x 45 min during BADL activity OT Short Term Goal 2 (Week 1): Patient will complete upper boody bathing with min assist OT Short Term Goal 3 (Week 1): Patient will complete lower body bathing with mod assist OT Short Term Goal 4 (Week 1): Patient will don/doff pull over shirt with min assist OT Short Term Goal 5 (Week 1): Patient will don pants with mod assist  Skilled Therapeutic Interventions/Progress Updates:   Patient received seated in recliner with washcloth on head and emesis bag in lap.  Patient and daughter report significant nausea.  Patient willing to complete oral care.  Patient transported to sink to brush her teeth, followed with suction as patient unable to fully clear mouth.  Patient changed into clean tshirt at daughter's request.  Plan for PM OT session to aide with bathing and dressing.  Patient assisted back to bed to rest.  Reports nausea subsides with supine/sidelying position.   Bed alarm engaged and call bell/personal items in reach.    Therapy Documentation Precautions:  Precautions Precautions: Fall Precaution Comments: 02, cortrak, indwelling cath, vision deficit, ataxia Restrictions Weight Bearing Restrictions: No   Pain: Pain Assessment Pain Scale: Faces Pain Score: 0-No pain Faces Pain Scale: No hurt        Therapy/Group: Individual Therapy  [name] 10/29/2022, 12:35 PM

## 2022-10-29 NOTE — Progress Notes (Signed)
Occupational Therapy Session Note  Patient Details  Name: Joan Robinson MRN: [id number] Date of Birth: 16-Jul-1960  Today's Date: 10/29/2022 OT Individual Time: 1415-1500 OT Individual Time Calculation (min): 45 min    Short Term Goals: Week 1:  OT Short Term Goal 1 (Week 1): Patient will maintain arousal x 45 min during BADL activity OT Short Term Goal 2 (Week 1): Patient will complete upper boody bathing with min assist OT Short Term Goal 3 (Week 1): Patient will complete lower body bathing with mod assist OT Short Term Goal 4 (Week 1): Patient will don/doff pull over shirt with min assist OT Short Term Goal 5 (Week 1): Patient will don pants with mod assist  Skilled Therapeutic Interventions/Progress Updates:    Pt received in bed and agreeable to getting up to take a bath at the sink. To avoid inducing increased nausea, had pt work on visual fixation as she completed sidelying to sit and then stand pivot to w/c.  Asked pt if she thought that strategy was helpful, but she did not notice a difference. Although pt was able to participate and complete standing tasks at sink for 1 min at a time and complete self care without feeling extremely nauseated.  Overall pt moved with min A and mod A to support dynamic standing at sink.  She was able to wash self and don clothing with min A to pull over hips.  She also tolerated the entire session.  After a thorough bath, pt opted to lay back down.   Pt resting in bed with all needs met. Alarm set and call light in reach.    Therapy Documentation Precautions:  Precautions Precautions: Fall Precaution Comments: 02, cortrak, indwelling cath, vision deficit, ataxia Restrictions Weight Bearing Restrictions: No    Vital Signs: Therapy Vitals Temp: 98.1 F (36.7 C) Pulse Rate: 81 Resp: 17 BP: 109/74 Patient Position (if appropriate): Lying Oxygen Therapy SpO2: 99 % O2 Device: Room Air Pain: Pain Assessment Pain Score: 0-No pain     Therapy/Group: Individual Therapy  Jesyca Weisenburger 10/29/2022, 4:03 PM

## 2022-10-29 NOTE — Progress Notes (Signed)
PROGRESS NOTE   Subjective/Complaints: Working with [place] in gym Feeling nauseous, daughter asks if Zofran can be scheduled +vretigo  ROS: +anxiety. See HPI. Denies CP, SOB, abd pain, n/v/d/c, +vertigo   Objective:   CT HEAD WO CONTRAST ( )  Result Date: 10/28/2022 CLINICAL DATA:  [facility] EXAM: CT HEAD WITHOUT CONTRAST TECHNIQUE: Contiguous axial images were obtained from the base of the skull through the vertex without intravenous contrast. RADIATION DOSE REDUCTION: This exam was performed according to the departmental dose-optimization program which includes automated exposure control, adjustment of the mA and/or kV according to patient size and/or use of iterative reconstruction technique. COMPARISON:  Brain MRI 10/15/2022 and head CT 10/19/2022 FINDINGS: Brain: Decreased mass effect within the posterior fossa improved patency of the fourth ventricle. No hydrocephalus. Resolving hypoattenuation within the left cerebellar hemisphere. No hemorrhage or extra-axial collection. Unremarkable appearance of the supratentorial brain. Vascular: No hyperdense vessel or unexpected vascular calcification. Skull: The visualized skull base, calvarium and extracranial soft tissues are normal. Sinuses/Orbits: Partial opacification of the left mastoid. Paranasal sinuses are clear. Normal orbits. IMPRESSION: Decreased mass effect within the posterior fossa with improved patency of the fourth ventricle. No hydrocephalus. Electronically Signed   By: [name] M.D.   On: 10/28/2022 19:17   Recent Labs    10/29/22 0624  WBC 8.8  HGB 11.8*  HCT 35.7*  PLT 483*   Recent Labs    10/29/22 0624  NA 133*  K 4.9  CL 96*  CO2 27  GLUCOSE 127*  BUN 30*  CREATININE 0.87  CALCIUM 9.0    Intake/Output Summary (Last 24 hours) at 10/29/2022 1106 Last data filed at 10/29/2022 0645 Gross per 24 hour  Intake --  Output 0 ml  Net 0 ml         Physical Exam: Vital Signs Blood pressure 93/69, pulse 88, temperature 97.6 F (36.4 C), temperature source Oral, resp. rate 17, height 5\' 6"  (1.676 m), weight 68.1 kg, SpO2 98%. Constitutional:      Comments: in w/c with PT, NAD HENT:     Head: Normocephalic and atraumatic.     Comments: L facial droop L eye will not close- lower eyelid droops, no patch this morning Cortrak in place- TF's off    Right Ear: External ear normal.     Left Ear: External ear normal.     Nose: Nose normal. No congestion.     Mouth/Throat:     Mouth: Mucous membranes are dry.     Pharynx: Oropharynx is clear. No oropharyngeal exudate.      Eyes:     Comments: Left lower lid everted and erythematous-- patch removed today Cardiovascular:     Rate and Rhythm: Normal rate and regular rhythm.     Heart sounds: Normal heart sounds. No murmur heard.    No gallop.  Pulmonary: CTAB in all lung fields, no w/r/r, no hypoxia or increased WOB, speaking in full sentences Abdominal:     General: Bowel sounds are normal.     Comments: Dressing over midline incision intact- just changed C/D/I +BS throughout, soft, nonTTP, nondistended.  Neuro: L facial droop, alert, some memory deficits (couldn't recall being cath'd)  Musculoskeletal:     Cervical back: Neck supple. No tenderness.     Comments: 5/5 strength in upper extremities and 4/5 in lower extremities 10/14 Skin:    General: Skin is warm and dry.     Comments: IV L AC fossa and R forearm- look OK Sweating on face slightly- appears warm, but doesn't feel feverish  Neurological:     Comments: Somnolent- loudly snoring- cannot get her to wake up for more than ~ 30 seconds.    Assessment/Plan: 1. Functional deficits which require 3+ hours per day of interdisciplinary therapy in a comprehensive inpatient rehab setting. Physiatrist is providing close team supervision and 24 hour management of active medical problems listed below. Physiatrist and rehab team  continue to assess barriers to discharge/monitor patient progress toward functional and medical goals  Care Tool:  Bathing    Body parts bathed by patient: Left arm, Chest, Abdomen, Face   Body parts bathed by helper: Right arm, Left lower leg, Right lower leg, Buttocks, Front perineal area, Right upper leg, Left upper leg     Bathing assist Assist Level: Maximal Assistance - Patient 24 - 49%     Upper Body Dressing/Undressing Upper body dressing   What is the patient wearing?: Pull over shirt    Upper body assist Assist Level: Moderate Assistance - Patient 50 - 74%    Lower Body Dressing/Undressing Lower body dressing      What is the patient wearing?: Pants     Lower body assist Assist for lower body dressing: Total Assistance - Patient < 25%     Toileting Toileting Toileting Activity did not occur (Clothing management and hygiene only): N/A (no void or bm)  Toileting assist Assist for toileting: Moderate Assistance - Patient 50 - 74%     Transfers Chair/bed transfer  Transfers assist  Chair/bed transfer activity did not occur: Safety/medical concerns  Chair/bed transfer assist level: Moderate Assistance - Patient 50 - 74%     Locomotion Ambulation   Ambulation assist      Assist level: 2 helpers Assistive device: Walker-rolling Max distance: 53ft   Walk 10 feet activity   Assist     Assist level: 2 helpers Assistive device: Walker-rolling   Walk 50 feet activity   Assist Walk 50 feet with 2 turns activity did not occur: Safety/medical concerns (fatigue)         Walk 150 feet activity   Assist Walk 150 feet activity did not occur: Safety/medical concerns         Walk 10 feet on uneven surface  activity   Assist Walk 10 feet on uneven surfaces activity did not occur: Safety/medical concerns         Wheelchair     Assist Is the patient using a wheelchair?: Yes Type of Wheelchair: Manual    Wheelchair assist level:  Dependent - Patient 0% Max wheelchair distance: 11ft    Wheelchair 50 feet with 2 turns activity    Assist        Assist Level: Dependent - Patient 0%   Wheelchair 150 feet activity     Assist      Assist Level: Dependent - Patient 0%   Blood pressure 93/69, pulse 88, temperature 97.6 F (36.4 C), temperature source Oral, resp. rate 17, height 5\' 6"  (1.676 m), weight 68.1 kg, SpO2 98%.  Medical Problem List and Plan: 1. Functional deficits secondary to L PICA/AICA stroke with cerebellar edema             -  patient may  shower- if cover incisions             -ELOS/Goals: 10-12 days supervision             -Continue CIR  Head CT reviewed with patient 10/14 and improvements discussed   2.  Antithrombotics: -DVT/anticoagulation:  Pharmaceutical: Heparin 5000U q8h             -antiplatelet therapy: Plavix 75mg  daily   3. Chronic back pain: kpad ordered. Tylenol as needed             -continue Norco 5/325 one tablet q HS             -continue Robaxin 500 mg QID   4. Anxiety: lexapro restarted daily at 10mg               -continue trazodone 50 mg q HS-- timing changed to 8pm -continue melatonin 10 mg q HS-- d/c'd but unclear why, not sleeping well, reordered on 10/27/22             -antipsychotic agents: n/a   5. Neuropsych/cognition: This patient is? capable of making decisions on her (since sedated, cannot tell) own behalf.   6. Skin/Wound Care: Routine skin care checks             -continue packing of lower aspect of midline incision BID   7. Fluids/Electrolytes/Nutrition: Routine Is and Os and follow-up chemistries             -continue TF and monitor oral intake             -continue D1 diet with honey thick liquids- just upgraded from puree             -SLP eval             -?? 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 Urinary retention: placed order for foley removal 10/27/22  -10/27/22 foley ordered to be removed today, ordered PVRs to monitor -10/28/22 foley removed, one cath overnight for but otherwise low PVRs--monitor  18. Leukocytosis: WBC reviewed 10/14 and has resolved  19. Nausea: seems to be mostly with movement/transitions, has zofran ordered but will trial meclizine to see if this helps with the movement aspect (pt doesn't state she's dizzy but how she describes her symptoms seems to have a rotational/movement component); started with 12.5mg  TID PRN, could always go up also.   10/14: schedule zofran IV daily at 7am  20. Vertigo: d/c scopolamine patch, meclizine started prn, requested PT vestibular eval  21. Lethargy: diet is currently NPO but may given puree/honey thick liquids when completely  awake, messaged SLP and RN and discussed with daughter, decreasing TF rate to 40 to minimize nausea/stimulate oral appetite  LOS: 4 days A FACE TO FACE EVALUATION WAS PERFORMED  Sreya Froio P Halee Glynn 10/29/2022, 11:06 AM

## 2022-10-29 NOTE — Progress Notes (Signed)
Speech Language Pathology Daily Session Note  Patient Details  Name: Joan Robinson MRN: [id number] Date of Birth: Jul 26, 1960  Today's Date: 10/29/2022 SLP Individual Time: 1020-1058 SLP Individual Time Calculation (min): 38 min  Short Term Goals: Week 1: SLP Short Term Goal 1 (Week 1): Pt. will participate in bolus trials and assessment of diet tolerance of Dys1/HTLs with SLP and exhibit no overt difficulty with boluses given maxA for use of compensatory/safe swallow strategies SLP Short Term Goal 2 (Week 1): Patient will demonstrate increased alertness and overall bolus acceptance in order to demonstrate readiness for repeated objective swallow study over 2 sessions. SLP Short Term Goal 3 (Week 1): Patient will utilize speech intelligibility increasing strategies at the word and phrase levels with maxA in 60% of opportunities. SLP Short Term Goal 4 (Week 1): Patient will exhibit improved endurance/alertness to participate in further cognitive testing in order to determine full extent of cognitive deficits to aid goal setting for inpatient rehabilitation stay.  Skilled Therapeutic Interventions: SLP conducted skilled therapy session targeting dysphagia management and communication goals. Patient asleep in bed with daughter present but easily rousable and repositioned into upright position for POs. SLP provided patient with setup assist for oral care via suction toothbrush with patient then brushing teeth with supervision. SLP administered Dys1/HTL boluses including 3 ounces of pudding and ~1 ounce of sweet tea. Swallow initiation observed on 100% of trials with no overt s/sx of penetration/aspiration. Patient exhibiting trismus, unable to open jaw sufficiently for receipt of large boluses. SLP administered small boluses and patient cleared labial residue with washcloth independently after each bite. Daughter continues to endorse waxing/waning alertness from previous day, thus recommend continuation  of NPO with Dys1/HTLs administered only when alert. Should patient continue to exhibit consistent alertness at next session, anticipate upgrade to oral diet at next session. SLP introduced SLOP speech intelligibility increasing strategies with patient then utilizing strategies at the word level given modA to increase intelligibility to 100%. Patient was left in lowered bed with call bell in reach and bed alarm set. SLP will continue to target goals per plan of care.       Pain Pain Assessment Pain Scale: Faces Pain Score: 0-No pain Faces Pain Scale: No hurt  Therapy/Group: Individual Therapy  Jeannie Done, M.A., CCC-SLP  Yetta Barre 10/29/2022, 12:02 PM

## 2022-10-29 NOTE — Progress Notes (Signed)
Subjective: CC: Noted events overnight. Had pupillary changes and CTH obtained. CTH showed decreased mass effect within the posterior fossa with improved patency of the fourth ventricle. No hydrocephalus.  Was on a D1 diet. Reports she tolerated pudding yesterday. NPO currently.   Reports nausea with standing. No nausea at rest or with enteral nutrition. Tolerating tf's last night. No vomiting. BM yesterday. Denies any abdominal pain.   Working with therapies today.   Objective: Vital signs in last 24 hours: Temp:  [97.5 F (36.4 C)-97.6 F (36.4 C)] 97.6 F (36.4 C) (10/14 0635) Pulse Rate:  [74-88] 88 (10/14 0635) Resp:  [17-18] 17 (10/14 0635) BP: (93-106)/(69-82) 93/69 (10/14 0635) SpO2:  [98 %-100 %] 98 % (10/14 0635) Last BM Date : 10/28/22  Intake/Output from previous day: No intake/output data recorded. Intake/Output this shift: No intake/output data recorded.  PE: Gen:  Alert, NAD, pleasant Abd: Soft, no distension, very mild left sided ttp, no rigidity or guarding and otherwise NT, +BS. Midline wound open inferiorly with scant drainage. No erythema. Superior staples C/D/I   Lab Results:  Recent Labs    10/26/22 1025 10/29/22 0624  WBC 11.8* 8.8  HGB 12.4 11.8*  HCT 37.2 35.7*  PLT 396 483*   BMET Recent Labs    10/26/22 1025 10/29/22 0624  NA 133* 133*  K 5.1 4.9  CL 99 96*  CO2 23 27  GLUCOSE 137* 127*  BUN 22 30*  CREATININE 0.74 0.87  CALCIUM 9.2 9.0   PT/INR No results for input(s): "LABPROT", "INR" in the last 72 hours. CMP     Component Value Date/Time   NA 133 (L) 10/29/2022 0624   K 4.9 10/29/2022 0624   CL 96 (L) 10/29/2022 0624   CO2 27 10/29/2022 0624   GLUCOSE 127 (H) 10/29/2022 0624   BUN 30 (H) 10/29/2022 0624   CREATININE 0.87 10/29/2022 0624   CREATININE 0.91 09/02/2015 1352   CALCIUM 9.0 10/29/2022 0624   PROT 7.0 10/26/2022 1025   ALBUMIN 3.1 (L) 10/26/2022 1025   AST 39 10/26/2022 1025   ALT 31 10/26/2022  1025   ALKPHOS 110 10/26/2022 1025   BILITOT 1.1 10/26/2022 1025   GFRNONAA >60 10/29/2022 0624   GFRAA  12/16/2009 2144    >60        The eGFR has been calculated using the MDRD equation. This calculation has not been validated in all clinical situations. eGFR's persistently <60 mL/min signify possible Chronic Kidney Disease.   Lipase     Component Value Date/Time   LIPASE 10 (L) 10/13/2022 2116    Studies/Results: CT HEAD WO CONTRAST ( )  Result Date: 10/28/2022 CLINICAL DATA:  [facility] EXAM: CT HEAD WITHOUT CONTRAST TECHNIQUE: Contiguous axial images were obtained from the base of the skull through the vertex without intravenous contrast. RADIATION DOSE REDUCTION: This exam was performed according to the departmental dose-optimization program which includes automated exposure control, adjustment of the mA and/or kV according to patient size and/or use of iterative reconstruction technique. COMPARISON:  Brain MRI 10/15/2022 and head CT 10/19/2022 FINDINGS: Brain: Decreased mass effect within the posterior fossa improved patency of the fourth ventricle. No hydrocephalus. Resolving hypoattenuation within the left cerebellar hemisphere. No hemorrhage or extra-axial collection. Unremarkable appearance of the supratentorial brain. Vascular: No hyperdense vessel or unexpected vascular calcification. Skull: The visualized skull base, calvarium and extracranial soft tissues are normal. Sinuses/Orbits: Partial opacification of the left mastoid. Paranasal sinuses are clear. Normal orbits. IMPRESSION: Decreased  mass effect within the posterior fossa with improved patency of the fourth ventricle. No hydrocephalus. Electronically Signed   By: [name] M.D.   On: 10/28/2022 19:17    Anti-infectives: Anti-infectives (From admission, onward)    None      FINAL MICROSCOPIC DIAGNOSIS:   A. COLON, RIGHT, RESECTION:       Segment of colon with terminal ileum with submucosal congestion and   serosal mesothelial reaction.       Vermiform appendix without significant diagnostic alteration.       Four lymph nodes, negative for metastatic carcinoma (0/4).       Negative for dysplasia or malignancy.   B. ADDITIONAL ILEUM, RESECTION:       Segment of small intestine without significant diagnostic  alteration.      Focal mesenteric hemorrhage.       Negative for dysplasia or malignancy.   Assessment/Plan Cecal volvulus  POD#13 s/p Laparoscopic converted to open right hemicolectomy 10/1 Dr. Cliffton Asters - Path benign as above - Diet per SLP and primary. Continue TF@ goal until taking in enough PO then ok to wean off - Would recommend holding off on PEG at the moment and allowing patient to continue to work with SLP - if not improving then reasonable to consider prior to DC as stated in previous notes.  - Mobilize, therapies - in rehab - Removed staples from inferior incision 10/10 - looks significantly improved. Continue BID wet to dry dressing to open portion of wound. Her daughter is at bedside, is a [employer] fundraiser, and reports she will be able to help with this at d/c.  - Will write for staple removal from the remainder of midline wound on POD 14.  - We will sign off. Will ensure f/u arranged in the office. Please call back with any questions or concerns.    Acute stroke - Per Neuro. Started on Plavix 10/6. Hgb 11.8   FEN: Diet per SLP/primary, TF's VTE: sq heparin. Plavix ID: Cefotetan 10/1>10/2. None currently. Aferbile. WBC wnl  Foley: Out, voiding    LOS: 4 days    Jacinto Halim , [facility] 10/29/2022, 10:03 AM Please see Amion for pager number during day hours 7:00am-4:30pm

## 2022-10-29 NOTE — Progress Notes (Signed)
Physical Therapy Session Note  Patient Details  Name: Joan Robinson MRN: [id number] Date of Birth: January 26, 1960  Today's Date: 10/29/2022 PT Individual Time: 0800-0911 PT Individual Time Calculation (min): 71 min   Short Term Goals: Week 1:  PT Short Term Goal 1 (Week 1): Pt will complete bed mobility with CGA PT Short Term Goal 2 (Week 1): Pt will complete bed<>chair transfers with CGA and LRAD PT Short Term Goal 3 (Week 1): Pt will ambulate 135ft with minA and LRAD  Skilled Therapeutic Interventions/Progress Updates:      Pt lying in bed to start - on tube feeds via coretrack but no urethral catheter or supplemental O2. Pt reports some mild L eye pain but doesn't seem to affect her in therapy.  Supine<>sitting EOB with CGA with hospital bed features. CGA for forward scooting to EOB, L arm getting awkwardly caught behind her at times with cues for awareness. Sit<>stand and stand pivot transfer with min/modA with no AD from EOB to w/c, towards her L side. Pt ataxic and has strong L lean in standing.  Transported to main rehab gym. Focused remainder of session on functional gait training and postural control.  Sit<>stand to RW with minA, primarily for trunk support. She ambulates ~41ft + ~40ft + ~23ft + ~149ft (extended seated rest breaks 2/2 fatigue and nausea) with modA overall. Assist primarily needed for trunk support and RW management, pt leans quite heavily to her L side during gait - especially as she fatigues after ~18ft. Narrow BOS with some adducting on L foot.   Pt instructed in alternating and single leg toe taps with min/modA and RW support to 4" platform. Able to complete 2x10 with seated rest breaks for nausea/fatigue.  RN and MD made aware of persistent nausea - zofran provided during treatment and plan to have zofran made scheduled in the AM.   Pt ended session seated in recliner with safety belt alarm on, call bell in reach, and her supportive daughter present.   BP  112/72 HR 79, O2 97% on RA  Therapy Documentation Precautions:  Precautions Precautions: Fall Precaution Comments: 02, cortrak, indwelling cath, vision deficit, ataxia Restrictions Weight Bearing Restrictions: No General:    Therapy/Group: Individual Therapy  Orrin Brigham 10/29/2022, 7:46 AM

## 2022-10-30 DIAGNOSIS — I639 Cerebral infarction, unspecified: Secondary | ICD-10-CM | POA: Diagnosis not present

## 2022-10-30 MED ORDER — OSMOLITE 1.5 CAL PO LIQD
360.0000 mL | ORAL | Status: DC
  Administered 2022-10-30 – 2022-10-31 (×2): 360 mL
  Filled 2022-10-30 (×2): qty 474

## 2022-10-30 NOTE — Progress Notes (Signed)
Physical Therapy Session Note  Patient Details  Name: Joan Robinson MRN: [id number] Date of Birth: November 25, 1960  Today's Date: 10/30/2022 PT Individual Time: 7829-5621 PT Individual Time Calculation (min): 57 min   Short Term Goals: Week 1:  PT Short Term Goal 1 (Week 1): Pt will complete bed mobility with CGA PT Short Term Goal 2 (Week 1): Pt will complete bed<>chair transfers with CGA and LRAD PT Short Term Goal 3 (Week 1): Pt will ambulate 169ft with minA and LRAD  Skilled Therapeutic Interventions/Progress Updates:      Pt resting in bed with her daughter present at the bedside. RN made aware of request to pause TF. Also provided her with morning meds. Dtr asking for alternative to eye patch, something more comfortable. Will check and see what's available.   Supine<>sitting EOB with supervision with HOB elevated. Completed stand pivot transfer with minA and no AD from EOB to w/c, towards her R side.   Transported to day room rehab gym for time management.   Gait training completed with RW ~55ft + ~46ft with min/modA and +2 assist for w/c follow for safety. Heavy assist needed for balance only due to severe L trunk lean and ataxic gait. Decreased weight shifting to her R side and adducting of the L foot. Challenged gait by taking RW away - ambulated ~42ft + ~47ft with modA and no AD - pt's arms wrapped around PT who was bracing her on her L side. Similar gait deficits and impairments but improved ability for PT to facilitate weight shifting and stabilize her trunk due to L lean.   Added balance and visual tracking tasks in unsupported standing - she was instructed on removing and hanging horseshoes on top of ~74ft tall mirror with her involved L side with minA for standing balance. She also did single leg toe taps with involved L foot onto a cone with modA for balance, very ataxic and difficulty with standing and accuracy with foot placement.   Pt returned to her room - assisted to bed  with modA stand step transfer. Bed mobility completed without assist. Daughter present in room for needs.    Therapy Documentation Precautions:  Precautions Precautions: Fall Precaution Comments: 02, cortrak, indwelling cath, vision deficit, ataxia Restrictions Weight Bearing Restrictions: No General:      Therapy/Group: Individual Therapy  Orrin Brigham 10/30/2022, 7:46 AM

## 2022-10-30 NOTE — Progress Notes (Signed)
Occupational Therapy Session Note  Patient Details  Name: Joan Robinson MRN: [id number] Date of Birth: February 14, 1960  Today's Date: 10/30/2022 OT Individual Time: 1500-1531 OT Individual Time Calculation (min): 31 min    Short Term Goals: Week 1:  OT Short Term Goal 1 (Week 1): Patient will maintain arousal x 45 min during BADL activity OT Short Term Goal 2 (Week 1): Patient will complete upper boody bathing with min assist OT Short Term Goal 3 (Week 1): Patient will complete lower body bathing with mod assist OT Short Term Goal 4 (Week 1): Patient will don/doff pull over shirt with min assist OT Short Term Goal 5 (Week 1): Patient will don pants with mod assist  Skilled Therapeutic Interventions/Progress Updates:   Pt seen for skilled OT session with treatment bed and EOB level as per pt preference. OT addressed plan for nursing to order a plastic eye shield to protect eye and add increased comfort for ptosis on L. OT incorporated L sided head turns, scanning and L UE graded control into tasks. Pt moved to EOB with CGA and sat to retrieve items with use of reacher in L UE from floor with increased time and effort with min cues for scanning and coordination. Pt able to brush hair and sip 4 trials of thickened liquid with set up and cues using L UE EOB. Pt scooted up to [facility] with CGA and cues.Pt completed reacher use "cane" exercises for scap and sh B ly 15 ewps x 2 sets with focus on smooth L UE coordination.  Left pt bed level with needs, bed alarm active and nurse call button in reach.   Pain: mild L eye pain when asked to address scanning and ocular motor skills un-rated and resolved with rest   Therapy Documentation Precautions:  Precautions Precautions: Fall Precaution Comments: 02, cortrak, indwelling cath, vision deficit, ataxia Restrictions Weight Bearing Restrictions: No  Therapy/Group: Individual Therapy  Vicenta Dunning 10/30/2022, 7:52 AM

## 2022-10-30 NOTE — Progress Notes (Signed)
Speech Language Pathology Daily Session Note  Patient Details  Name: Joan Robinson MRN: [id number] Date of Birth: 01-Jul-1960  Today's Date: 10/30/2022 SLP Individual Time: 0804-0901 SLP Individual Time Calculation (min): 57 min  Short Term Goals: Week 1: SLP Short Term Goal 1 (Week 1): Pt. will participate in bolus trials and assessment of diet tolerance of Dys1/HTLs with SLP and exhibit no overt difficulty with boluses given maxA for use of compensatory/safe swallow strategies SLP Short Term Goal 2 (Week 1): Patient will demonstrate increased alertness and overall bolus acceptance in order to demonstrate readiness for repeated objective swallow study over 2 sessions. SLP Short Term Goal 3 (Week 1): Patient will utilize speech intelligibility increasing strategies at the word and phrase levels with maxA in 60% of opportunities. SLP Short Term Goal 4 (Week 1): Patient will exhibit improved endurance/alertness to participate in further cognitive testing in order to determine full extent of cognitive deficits to aid goal setting for inpatient rehabilitation stay.  Skilled Therapeutic Interventions: SLP conducted skilled therapy session targeting dysphagia management goals. Patient greeted upright in bed, alert and awaiting session. Patient agreeable to continued PO trials. SLP assisted patient with setup for use of suction toothbrush and patient completed oral care with minA for reaching inside of teeth given stroke-induced trismus. 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 SLP guided patient through 5 sets of 30 second stretches with device and instructed present family member on how to assist with stretching throughout the day. Patient promoted improved jaw looseness after set of 5 was completed. SLP assessed tolerance of Dys1/HTL  boluses with patient now exhibiting independent swallowing of textures. Given improved overall and consistent alertness, patient appropriate for re-initiation of PO diet. Recommend Dys1/HTL diet. Patient was left in lowered bed with call bell in reach and bed alarm set. SLP will continue to target goals per plan of care.      Pain Pain Assessment Pain Scale: 0-10 Pain Score: 0-No pain  Therapy/Group: Individual Therapy  Jeannie Done, M.A., CCC-SLP  Yetta Barre 10/30/2022, 9:36 AM

## 2022-10-30 NOTE — Progress Notes (Signed)
Physical Therapy Session Note  Patient Details  Name: Joan Robinson MRN: [id number] Date of Birth: December 27, 1960  Today's Date: 10/30/2022 PT Individual Time: 1300-1330 PT Individual Time Calculation (min): 30 min  and Today's Date: 10/30/2022 PT Missed Time: 30 Minutes Missed Time Reason: Other (Comment) (dizziness/fatigue)  Short Term Goals: Week 1:  PT Short Term Goal 1 (Week 1): Pt will complete bed mobility with CGA PT Short Term Goal 2 (Week 1): Pt will complete bed<>chair transfers with CGA and LRAD PT Short Term Goal 3 (Week 1): Pt will ambulate 150ft with minA and LRAD  Skilled Therapeutic Interventions/Progress Updates:      Therapy Documentation Precautions:  Precautions Precautions: Fall Precaution Comments: 02, cortrak, indwelling cath, vision deficit, ataxia Restrictions Weight Bearing Restrictions: No General: PT Amount of Missed Time (min): 30 Minutes PT Missed Treatment Reason: Other (Comment) (dizziness/fatigue)  Pt agreeable to vestibular evaluation and required supervision with rolling and supine<>sit. Pt mod A with sit<>stand no AD and for static standing balance in session.   Subjective history:   Onset: "few days ago"   Duration: <30 seconds or >30 seconds   Aggravating factors: sit<>stand, gait   Easing factors: sit down and lie down   Symptoms: "head and body spinning", lightheadedness, nausea, sweaty/ "flushed"   No PMH dizziness   No history of cervical spine trauma   Vision: "floaters" of left eye since stroke   Objective measurements:   Orthostatic Hypotension (negative)    Supine: 104/63   Sitting: 107/65    Standing x 1 min: 98/66    Standing x 3 min: 101/69    Vertebro-basilar artery insufficiency (VBI) Assessment    Right: horizontal nystagmus    Left: up beating torsional nystagmus  Findings:   Pt with positive VBI bilaterally with nystagmus noted in bilateral directions, deferred further central and peripheral vestibular testing  for safety. MD and PA notified.   Pt deferred further participation in PT session and left semi-reclined in bed with all needs in reach and alarm on.    Therapy/Group: Individual Therapy  Truitt Leep Truitt Leep PT, DPT  10/30/2022, 2:45 PM

## 2022-10-30 NOTE — Progress Notes (Signed)
PROGRESS NOTE   Subjective/Complaints: Feeling better Continues to have vertigo Nausea improved Notes decreased sensation in right hand  ROS: +anxiety. See HPI. Denies CP, SOB, abd pain, n/v/d/c, +vertigo, decreased sensation to right hand   Objective:   DG Abd 1 View  Result Date: 10/29/2022 CLINICAL DATA:  Nausea, vomiting. EXAM: ABDOMEN - 1 VIEW COMPARISON:  October 19, 2022. FINDINGS: No abnormal bowel dilatation. Distal tip of feeding tube seen in proximal stomach. Residual contrast noted in nondilated distal colon. IMPRESSION: No abnormal bowel dilatation. Electronically Signed   By: Lupita Raider M.D.   On: 10/29/2022 20:32   CT HEAD WO CONTRAST ( )  Result Date: 10/28/2022 CLINICAL DATA:  [facility] EXAM: CT HEAD WITHOUT CONTRAST TECHNIQUE: Contiguous axial images were obtained from the base of the skull through the vertex without intravenous contrast. RADIATION DOSE REDUCTION: This exam was performed according to the departmental dose-optimization program which includes automated exposure control, adjustment of the mA and/or kV according to patient size and/or use of iterative reconstruction technique. COMPARISON:  Brain MRI 10/15/2022 and head CT 10/19/2022 FINDINGS: Brain: Decreased mass effect within the posterior fossa improved patency of the fourth ventricle. No hydrocephalus. Resolving hypoattenuation within the left cerebellar hemisphere. No hemorrhage or extra-axial collection. Unremarkable appearance of the supratentorial brain. Vascular: No hyperdense vessel or unexpected vascular calcification. Skull: The visualized skull base, calvarium and extracranial soft tissues are normal. Sinuses/Orbits: Partial opacification of the left mastoid. Paranasal sinuses are clear. Normal orbits. IMPRESSION: Decreased mass effect within the posterior fossa with improved patency of the fourth ventricle. No hydrocephalus.  Electronically Signed   By: [name] M.D.   On: 10/28/2022 19:17   Recent Labs    10/29/22 0624  WBC 8.8  HGB 11.8*  HCT 35.7*  PLT 483*   Recent Labs    10/29/22 0624  NA 133*  K 4.9  CL 96*  CO2 27  GLUCOSE 127*  BUN 30*  CREATININE 0.87  CALCIUM 9.0   No intake or output data in the 24 hours ending 10/30/22 1157       Physical Exam: Vital Signs Blood pressure 97/61, pulse 88, temperature 97.7 F (36.5 C), resp. rate 18, height 5\' 6"  (1.676 m), weight 68.1 kg, SpO2 96%. Constitutional:      Comments: in w/c with PT, NAD HENT:     Head: Normocephalic and atraumatic.     Comments: L facial droop L eye will not close- lower eyelid droops, no patch this morning Cortrak in place- TF's off    Right Ear: External ear normal.     Left Ear: External ear normal.     Nose: Nose normal. No congestion.     Mouth/Throat:     Mouth: Mucous membranes are dry.     Pharynx: Oropharynx is clear. No oropharyngeal exudate.      Eyes:     Comments: Left lower lid everted and erythematous-- patch removed today Cardiovascular:     Rate and Rhythm: Normal rate and regular rhythm.     Heart sounds: Normal heart sounds. No murmur heard.    No gallop.  Pulmonary: CTAB in all lung fields, no w/r/r, no hypoxia  or increased WOB, speaking in full sentences Abdominal:     General: Bowel sounds are normal.     Comments: Dressing over midline incision intact- just changed C/D/I +BS throughout, soft, nonTTP, nondistended.  Neuro: L facial droop, alert, some memory deficits, decreased sensation to right side of face and right hand Musculoskeletal:     Cervical back: Neck supple. No tenderness.     Comments: 5/5 strength in upper extremities and 4/5 in lower extremities stable 10/15 Skin:    General: Skin is warm and dry.     Comments: IV L AC fossa and R forearm- look OK Sweating on face slightly- appears warm, but doesn't feel feverish      Assessment/Plan: 1. Functional  deficits which require 3+ hours per day of interdisciplinary therapy in a comprehensive inpatient rehab setting. Physiatrist is providing close team supervision and 24 hour management of active medical problems listed below. Physiatrist and rehab team continue to assess barriers to discharge/monitor patient progress toward functional and medical goals  Care Tool:  Bathing    Body parts bathed by patient: Left arm, Chest, Abdomen, Face   Body parts bathed by helper: Right arm, Left lower leg, Right lower leg, Buttocks, Front perineal area, Right upper leg, Left upper leg     Bathing assist Assist Level: Maximal Assistance - Patient 24 - 49%     Upper Body Dressing/Undressing Upper body dressing   What is the patient wearing?: Pull over shirt    Upper body assist Assist Level: Moderate Assistance - Patient 50 - 74%    Lower Body Dressing/Undressing Lower body dressing      What is the patient wearing?: Pants     Lower body assist Assist for lower body dressing: Total Assistance - Patient < 25%     Toileting Toileting Toileting Activity did not occur (Clothing management and hygiene only): N/A (no void or bm)  Toileting assist Assist for toileting: Moderate Assistance - Patient 50 - 74%     Transfers Chair/bed transfer  Transfers assist  Chair/bed transfer activity did not occur: Safety/medical concerns  Chair/bed transfer assist level: Moderate Assistance - Patient 50 - 74%     Locomotion Ambulation   Ambulation assist      Assist level: 2 helpers Assistive device: Walker-rolling Max distance: 26ft   Walk 10 feet activity   Assist     Assist level: 2 helpers Assistive device: Walker-rolling   Walk 50 feet activity   Assist Walk 50 feet with 2 turns activity did not occur: Safety/medical concerns (fatigue)         Walk 150 feet activity   Assist Walk 150 feet activity did not occur: Safety/medical concerns         Walk 10 feet on  uneven surface  activity   Assist Walk 10 feet on uneven surfaces activity did not occur: Safety/medical concerns         Wheelchair     Assist Is the patient using a wheelchair?: Yes Type of Wheelchair: Manual    Wheelchair assist level: Dependent - Patient 0% Max wheelchair distance: 82ft    Wheelchair 50 feet with 2 turns activity    Assist        Assist Level: Dependent - Patient 0%   Wheelchair 150 feet activity     Assist      Assist Level: Dependent - Patient 0%   Blood pressure 97/61, pulse 88, temperature 97.7 F (36.5 C), resp. rate 18, height 5\' 6"  (1.676  m), weight 68.1 kg, SpO2 96%.  Medical Problem List and Plan: 1. Functional deficits secondary to L PICA/AICA stroke with cerebellar edema             -patient may  shower- if cover incisions             -ELOS/Goals: 10-12 days supervision             -Continue CIR  Head CT reviewed with patient 10/14 and improvements discussed   2.  Antithrombotics: -DVT/anticoagulation:  Pharmaceutical: Heparin 5000U q8h             -antiplatelet therapy: Plavix 75mg  daily   3. Chronic back pain: kpad ordered. Tylenol as needed             -continue Norco 5/325 one tablet q HS             -continue Robaxin 500 mg QID   4. Anxiety: lexapro restarted daily at 10mg               -continue trazodone 50 mg q HS-- timing changed to 8pm -continue melatonin 10 mg q HS-- d/c'd but unclear why, not sleeping well, reordered on 10/27/22             -antipsychotic agents: n/a   5. Neuropsych/cognition: This patient is? capable of making decisions on her (since sedated, cannot tell) own behalf.   6. Skin/Wound Care: Routine skin care checks             -continue packing of lower aspect of midline incision BID   7. Fluids/Electrolytes/Nutrition: Routine Is and Os and follow-up chemistries             -continue TF and monitor oral intake             -continue D1 diet with honey thick liquids- just upgraded from  puree             -SLP eval             -?? 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 Urinary retention: placed order for foley removal 10/27/22  -10/27/22 foley ordered to be removed today, ordered PVRs to monitor -10/28/22 foley removed, one cath overnight for but otherwise low PVRs--monitor  18. Leukocytosis: WBC reviewed 10/14 and has resolved  19. Nausea: seems to be mostly with movement/transitions, has zofran ordered but will trial meclizine to see if this helps with the movement aspect (pt doesn't state she's dizzy but how she describes her symptoms seems to have a rotational/movement component); started with 12.5mg  TID PRN, could always go up also.   schedule zofran IV daily  at 7am, discussed this helped, continue  20. Vertigo: d/c scopolamine patch, meclizine started prn, requested PT vestibular eval, discussed with patient that she will have to ask for meclizine and she prefers this to scheduled  21. Lethargy: improved, diet upgraded to D1/honey  22. Decreased appetite: improved, continue to wean TF to 51mls/hour to encourage oral appetite.   LOS: 5 days A FACE TO FACE EVALUATION WAS PERFORMED  Drema Pry Aylissa Heinemann 10/30/2022, 11:57 AM

## 2022-10-30 NOTE — Progress Notes (Addendum)
    Messaged Dr. Jackquline Bosch for guidance with left issues. (Pt's outpatient ophthalmologist) Awaiting call back. Placed message to Dr. Allena Katz on-call ophthalmologist.

## 2022-10-31 DIAGNOSIS — I639 Cerebral infarction, unspecified: Secondary | ICD-10-CM | POA: Diagnosis not present

## 2022-10-31 LAB — BASIC METABOLIC PANEL
Anion gap: 11 (ref 5–15)
BUN: 27 mg/dL — ABNORMAL HIGH (ref 8–23)
CO2: 27 mmol/L (ref 22–32)
Calcium: 9.8 mg/dL (ref 8.9–10.3)
Chloride: 97 mmol/L — ABNORMAL LOW (ref 98–111)
Creatinine, Ser: 0.97 mg/dL (ref 0.44–1.00)
GFR, Estimated: >60 mL/min (ref >60)
Glucose, Bld: 122 mg/dL — ABNORMAL HIGH (ref 70–99)
Potassium: 4.8 mmol/L (ref 3.5–5.1)
Sodium: 135 mmol/L (ref 135–145)

## 2022-10-31 MED ORDER — PROSOURCE TF20 ENFIT COMPATIBL EN LIQD
60.0000 mL | Freq: Every day | ENTERAL | Status: DC
  Administered 2022-11-01 – 2022-11-05 (×5): 60 mL
  Filled 2022-10-31 (×4): qty 60

## 2022-10-31 MED ORDER — PROSOURCE TF20 ENFIT COMPATIBL EN LIQD
60.0000 mL | Freq: Two times a day (BID) | ENTERAL | Status: DC
  Administered 2022-10-31: 60 mL
  Filled 2022-10-31: qty 60

## 2022-10-31 MED ORDER — ARTIFICIAL TEARS OPHTHALMIC OINT: TOPICAL_OINTMENT | Freq: Two times a day (BID) | OPHTHALMIC | Status: DC

## 2022-10-31 NOTE — Progress Notes (Signed)
Initial Nutrition Assessment  DOCUMENTATION CODES:   Non-severe (moderate) malnutrition in context of chronic illness  INTERVENTION:  Plan is to continue current nocturnal tube feeds via Cortrak with addition of PROSource TF20 added after dinner for additional protein: -Osmolite 1.5 at 30 mL/hour from 8PM-8AM (360 mL) -PROsource TF20 60 mL BID at 6PM and 10PM -Provides: 700 kcal, 62 grams of protein, 274 mL H2O daily This meets 39% minimum kcal needs and 69% minimum protein needs  Discussed with pt which foods on pureed diet are highest in protein and calories to help maximize PO intake. Pt is receiving Magic Cup with lunch and dinner trays and is working on eating these supplements.   If PO intake remains poor, would recommend the following nocturnal tube feed regimen to help meet calorie/protein needs: -Osmolite 1.5 at 75 mL/hour x 12 hours from 6PM-6AM (900 mL) -PROSource TF20 60 mL daily -Provides: 1510 kcal, 96 grams of protein This regimen would meet 84% minimum kcal needs and 100% of estimated protein needs  NUTRITION DIAGNOSIS:   Moderate Malnutrition related to chronic illness (hx esophageal strictures s/p dilations, multiple hernias s/p multiple surgeries, emphysema, prolonged difficulty with PO intake) as evidenced by mild fat depletion, mild muscle depletion, moderate muscle depletion.  GOAL:   Patient will meet greater than or equal to 90% of their needs  MONITOR:   PO intake, Supplement acceptance, Diet advancement, Labs, Weight trends, TF tolerance, I & O's  REASON FOR ASSESSMENT:   New TF    ASSESSMENT:   62 year old female with PMHx of esophageal strictures s/p dilation, multiple hernias s/p multiple surgries, GERD/PUD, HTN, HLD, autoimmune urticaria, emphysema, sleep apnea admitted with SBO and was being managed inpatient at [facility]. Found to have moderately large left PICA territory infarct and was transferred to ICU at [facility]. 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 Admitted to inpatient rehab on 10/25/22.  10/4: Cortrak tube placed 10/7: s/p MBS and diet advanced to dysphagia 1 with honey thick liquids 10/12: made NPO except for puree/honey thick liquids given by daughter if pt fully awake 10/15: diet advanced back to dysphagia 1 with honey thick liquids  Met with pt and family member at bedside. Pt reports she had difficulty with PO intake prior to admission due to "stomach issues" for years. She reports she feels her appetite is starting to improve now, but even at baseline she doesn't eat well. In the past 24 hours pt had 15% of lunch on 10/15, 10% of dinner on 10/15, and 25% of breakfast on 10/16. That is approximately 358 kcal (20% of minimum kcal needs), and 16 grams of protein (18% of minimum protein needs). Current intake between PO and tube feeds is not meeting estimated needs. Discussed with MD via secure chat regarding recommendation to increase nocturnal tube feeds to help meet estimated calorie/protein needs. Per discussion with MD pt prefers lower nocturnal tube feed rate and reports improved tolerance with this current rate. On admission to rehab tube feeds were over 24 hours. Tube feeds were then changed to nocturnal and rate slowly reduced. Reduced yesterday to current order. Plan is to add PROSource TF20 via tube to help meet protein needs, but per MD request only provide after dinner to prevent satiety. Also received request to discuss with pt ways to maximize intake at meals with current diet order.   Pt reports her recent UBW was approximately 174 lbs (79.1 kg)  and that she has lost weight over the past year. Per review of chart pt was 76.7 kg on 08/11/21. Current wt 68.1 kg. Pt has lost 8.6 kg or 11.2% weight over > 1 year, which is not significant for time frame but is concerning.  Enteral Access: 10 Fr. Cortrak tube placed 10/4; terminates in  proximal stomach per most recent abdominal x-ray 10/14  Tube Feeds: Osmolite 1.5 at 30 mL/hour from 8PM-8AM (360 mL) Provides: 540 kcal, 22 grams of protein, 274 mL H2O  Total intake between TF and PO previous 24 hours: 898 kcal (50% of minimum estimated kcal needs), 38 grams of protein (42% of minimum estimated protein needs)  Medications reviewed and include: levothyroxine, Zofran 4 mg daily IV, pantoprazole, Bactrim  Labs reviewed: Chloride 97, BUN 27  UOP: 2 occurrences unmeasured UOP in previous 24 hrs  I/O: -1695.8 mL since admission  NUTRITION - FOCUSED PHYSICAL EXAM:  Flowsheet Row Most Recent Value  Orbital Region Mild depletion  Upper Arm Region Moderate depletion  Thoracic and Lumbar Region No depletion  Buccal Region Mild depletion  Temple Region Mild depletion  Clavicle Bone Region Moderate depletion  Clavicle and Acromion Bone Region Moderate depletion  Scapular Bone Region Mild depletion  Dorsal Hand Moderate depletion  Patellar Region Moderate depletion  Anterior Thigh Region Moderate depletion  Posterior Calf Region Mild depletion  Edema (RD Assessment) Mild  Hair Reviewed  Eyes Reviewed  Mouth Reviewed  Skin Reviewed  Nails Reviewed      Diet Order:   Diet Order             DIET - DYS 1 Room service appropriate? Yes with Assist; Fluid consistency: Honey Thick  Diet effective now                  EDUCATION NEEDS:   Education needs have been addressed  Skin:  Skin Assessment: Skin Integrity Issues: Skin Integrity Issues:: Incisions Incisions: abdomen  Last BM:  10/28/22 per chart  Height:   Ht Readings from Last 1 Encounters:  10/25/22 5\' 6"  (1.676 m)   Weight:   Wt Readings from Last 1 Encounters:  10/31/22 67.7 kg   Ideal Body Weight:  59.1 kg  BMI:  Body mass index is 24.09 kg/m.  Estimated Nutritional Needs:   Kcal:  1800-2000  Protein:  90-110 grams  Fluid:  1.8-2 L/day  Joan Median, MS, RD, LDN,  CNSC Pager number available on Amion

## 2022-10-31 NOTE — Progress Notes (Signed)
Speech Language Pathology Daily Session Note  Patient Details  Name: Joan Robinson MRN: [id number] Date of Birth: July 08, 1960  Today's Date: 10/31/2022 SLP Individual Time: 1000-1100 SLP Individual Time Calculation (min): 60 min  Short Term Goals: Week 1: SLP Short Term Goal 1 (Week 1): Pt. will participate in bolus trials and assessment of diet tolerance of Dys1/HTLs with SLP and exhibit no overt difficulty with boluses given maxA for use of compensatory/safe swallow strategies SLP Short Term Goal 2 (Week 1): Patient will demonstrate increased alertness and overall bolus acceptance in order to demonstrate readiness for repeated objective swallow study over 2 sessions. SLP Short Term Goal 3 (Week 1): Patient will utilize speech intelligibility increasing strategies at the word and phrase levels with maxA in 60% of opportunities. SLP Short Term Goal 4 (Week 1): Patient will exhibit improved endurance/alertness to participate in further cognitive testing in order to determine full extent of cognitive deficits to aid goal setting for inpatient rehabilitation stay.  Skilled Therapeutic Interventions: SLP conducted skilled therapy session targeting dysphagia management, cognitive retraining, and communication goals. Patient greeted upright in chair, agreeable to participate in speech therapy session. Patient continues to exhibit stable and consistent alertness and reports no difficulty with breakfast tray, though expresses dismay with pureed foods. SLP guided patient through 5 sets of stretches for trismus to facilitate improved loosening of the mandibular area. SLP assessed patient tolerance of current Dys1/HTL diet with patient consuming textures with no overt s/sx of penetration/aspiration nor oral difficulty. SLP will plan to initiate chewing trials at next session. Given patient's improved alertness at recent sessions, administered SLUMS to formally target cognition. Scored 19/30, showing deficits in  areas of memory, working memory, and problem solving. Patient required reeducation to speech intelligibility strategies introduced at previous session, but with strategies was 100% intelligible at the sentence level given minA. Patient was left in chair with call bell in reach and chair alarm set. SLP will continue to target goals per plan of care.       Pain Pain Assessment Pain Scale: 0-10 Pain Score: 0-No pain  Therapy/Group: Individual Therapy  Jeannie Done, M.A., CCC-SLP  Yetta Barre 10/31/2022, 2:37 PM

## 2022-10-31 NOTE — Patient Care Conference (Signed)
Inpatient RehabilitationTeam Conference and Plan of Care Update Date: 10/31/2022   Time: 11:16 AM    Patient Name: Joan Robinson      Medical Record Number: [id number]  Date of Birth: 05-22-1960 Sex: Female         Room/Bed: 4W14C/4W14C-01 Payor Info: Payor: BLUE CROSS BLUE SHIELD / Plan: BCBS COMM PPO / Product Type: *No Product type* /    Admit Date/Time:  10/25/2022  5:34 PM  Primary Diagnosis:  CVA (cerebral vascular accident) [facility])  Hospital Problems: Principal Problem:   CVA (cerebral vascular accident) [facility])    Expected Discharge Date: Expected Discharge Date: 11/13/22  Team Members Present: Physician leading conference: Dr. Sula Soda Social Worker Present: Dossie Der, LCSW Nurse Present: Chana Bode, RN PT Present: Wynelle Link, PT OT Present: Limmie Patricia, OT SLP Present: Jeannie Done, SLP PPS Coordinator present : Fae Pippin, SLP     Current Status/Progress Goal Weekly Team Focus  Bowel/Bladder   pt continent of b/b, q4-6 bladder scans   remain continent   Assess qshift and prn    Swallow/Nutrition/ Hydration   Dys1/HTL diet   diet upgrade, minA  trismus, continued alertness during meals, endurance, diet tolerance    ADL's   Min A std pvt t/f, LB dressing (shoes - stability), SBA UB bathing/dressing, LB bathing. CGA. LUE ataxia, Left eye visual deficits with eye patch   SBA/Min A   LUE strength, coordination, motor control, family training. standing balance    Mobility   CGA bed mobility, minA sit to stand, modA stand pivot. modA gait with or without RW ~156ft. Ataxic, heavy R trunk lean, dizziness, limited activity tolerance, generalized weakness   CGA  activity tolerance, upright tolerance, gait training, NMR for ataxia    Communication   modA for use of speech intelligibility strategies   minA   use of speech intellgibility strategies at the word level    Safety/Cognition/ Behavioral Observations  need to reassess given  improvements in alertness            Pain   no c/o pain   Remain pain free   Assess qshift and prn    Skin   Skin intact, abd incision   Maintain skin integrity and wound care orders  Assess qshift and prn      Discharge Planning:  Home with husband with daughter providing care husband works. Pt making good progress in therapies. Await team's recommendations   Team Discussion: Patient post CVA with blurred vision, eye ptosis and lateral medullary syndrome with nystagamus, nausea  and dizziness with limited activity tolerance and generalized weakness with ataxia. UTI treated.   Patient on target to meet rehab goals: yes, currently needs CGA for bed mobility. Needs min assist for sit- stand and mod assist for balance due to left lean.  Needs supervision for upper body care and mod assist for transfers.  Maintained on a dysphagia 1 ;honey thick liquid diet with silent aspiration. Needs mod assist for speech intelligibility; working on complex problem solving. Decreased supplemental feedings to HS only.  Goals for discharge set for min assist overall.   *See Care Plan and progress notes for long and short-term goals.   Revisions to Treatment Plan:  Ophthalmologist, Dr. [name] follow up: keep lid closed, use Lacri-Lube, continue glaucoma eyedrops, and gentle cleansing with warm cloth for Lacri-Lube buildup with peri-orbital massage. Passive oral ROM device MD discontinued scopolamine patch and meclizine SLUMs test (19/30)  Teaching Needs: Safety, medications, dietary modification, transfers,  toileting, etc.   Current Barriers to Discharge: Wound care and Nutritional means  Possible Resolutions to Barriers: Family education     Medical Summary Current Status: UTI, hypotension, dysphagia, low protein stores, visual deficits  Barriers to Discharge: Medical stability  Barriers to Discharge Comments: UTI, hypotension, dysphagia, low protein stores, visual deficits Possible  Resolutions to [employer] Focus: continue Bactrim, continue to monitor blood pressure TID, conitnue TF, add prosource after supper, continue eye patch and consulted retinologist   Continued Need for Acute Rehabilitation Level of Care: The patient requires daily medical management by a physician with specialized training in physical medicine and rehabilitation for the following reasons: Direction of a multidisciplinary physical rehabilitation program to maximize functional independence : Yes Medical management of patient stability for increased activity during participation in an intensive rehabilitation regime.: Yes Analysis of laboratory values and/or radiology reports with any subsequent need for medication adjustment and/or medical intervention. : Yes   I attest that I was present, lead the team conference, and concur with the assessment and plan of the team.   Chana Bode B 10/31/2022, 2:09 PM

## 2022-10-31 NOTE — Progress Notes (Signed)
Physical Therapy Session Note  Patient Details  Name: Joan Robinson MRN: [id number] Date of Birth: 01/05/1961  Today's Date: 10/31/2022 PT Individual Time: 1300-1415 PT Individual Time Calculation (min): 75 min   Short Term Goals: Week 1:  PT Short Term Goal 1 (Week 1): Pt will complete bed mobility with CGA PT Short Term Goal 2 (Week 1): Pt will complete bed<>chair transfers with CGA and LRAD PT Short Term Goal 3 (Week 1): Pt will ambulate 135ft with minA and LRAD  Skilled Therapeutic Interventions/Progress Updates:      Pt resting in bed to start - TF on hold and has her L eye patch with gauze underneath it on. Still does not have her L eye taped shut. Discussed with PA during session concern - plan for nursing order for lid taping.   Supine<>sitting EOB with CGA and cues for awareness as her L arm gets awkwardly positioned behind her. Completed sit<>stand with minA and no AD and then stand step transfer with minA to wheelchair. Pt transported to main rehab gym for time management.  Pt instructed in gait training using RW with +2 assist for w/c follow - ambulated ~66ft with min/modA of 1 person. Assist primarily needed for trunk control only due to heavy L leaning, especially as she fatigues. Narrow BOS with some adducting on L side.   Continued gait training with B HHA and added a 2.5# ankle weight to her L ankle to improve awareness. Pt ambulated 3x188ft (seated rest breaks) with modA of 2 people, again assist only needed for the trunk support.   Assisted patient onto the Nustep and she completed a total of 5 minutes with rest breaks after every 1 minute due to fatigue. Emphasis on reciprocal stepping, coordinated control, and pacing herself. She c/o mild "cramping" in both thighs with this but recovers with rest.   Returned to her room and then patient requesting to toilet. Ambulatory transfer with RW and minA to the bathroom and patient able to doff pants and underwear in standing  with CGA/minA for balance. Pt continent of bladder void and this was charted. Able to stand with minA and CGA for standing balance as she raises her pants/underwear. Ambulated with RW and minA in her room to the bed. Bed mobility completed without assist. Alarm on and call bell in lap.    Therapy Documentation Precautions:  Precautions Precautions: Fall Precaution Comments: 02, cortrak, indwelling cath, vision deficit, ataxia Restrictions Weight Bearing Restrictions: No General:     Therapy/Group: Individual Therapy  Orrin Brigham 10/31/2022, 7:47 AM

## 2022-10-31 NOTE — Progress Notes (Signed)
Occupational Therapy Session Note  Patient Details  Name: Joan Robinson MRN: [id number] Date of Birth: [birth year]  Today's Date: 10/31/2022 OT Individual Time: 0906-1000 OT Individual Time Calculation (min): 54 min    Short Term Goals: Week 1:  OT Short Term Goal 1 (Week 1): Patient will maintain arousal x 45 min during BADL activity OT Short Term Goal 2 (Week 1): Patient will complete upper boody bathing with min assist OT Short Term Goal 3 (Week 1): Patient will complete lower body bathing with mod assist OT Short Term Goal 4 (Week 1): Patient will don/doff pull over shirt with min assist OT Short Term Goal 5 (Week 1): Patient will don pants with mod assist  Skilled Therapeutic Interventions/Progress Updates:    Patient agreeable to participate in OT session. Reports 0/10 pain level.   Patient participated in skilled OT session focusing on ADL re-training, NM re-education, standing balance, and functional transfers.  Pt participated in ADL re-training while participating in bathing at sink level.  Bed mobility: Pt required SBA in order to transition from supine to sitting EOB. No VC required to complete transitions. HOB elevated 30* and bed rail used on right side.   Functional transfers: Pt completed stand pivot transfer from bed to [facility] with Min A while utilizing no device. VC provided hand placement on bed rail and WC arm rest prior to sit to stand.   UB bathing/dressing: Pt complete while seated requiring SBA.  LB bathing/dressing: Pt complete while seated then standing requiring Min A. Pt educated on hemi dressing techniques and any compensatory techniques that may help with functional performance and decrease level of difficulty. Provided CGA assist while standing to help with standing balance and Min A provided to stabilize shoes in order to don.  Grooming: Pt complete while seated requiring SBA. Pt completed oral care, washed face, and brushed hair.  Standing balance: Pt  stood with CGA-close SBA utilizing RW to allow OT take photo of  front and back of T-shirt donned this AM.  Pt able to maintain static standing balance while also rotating 360* two times with CGA and holding RW. LUE motor coordination: - Pt particiapated in motor coordination task utilizing 2# wrist weight on LUE while placing/remove resistive clothes pins from vertical pole focusing on motor control, shoulder and scapular stability. Worked with yellow, red, blue, and black colors. Required rest breaks when removing clothespin d/t muscle fatigue.   Therapy Documentation Precautions:  Precautions Precautions: Fall Precaution Comments: 02, cortrak, indwelling cath, vision deficit, ataxia Restrictions Weight Bearing Restrictions: No   Therapy/Group: Individual Therapy  Limmie Patricia, OTR/L,CBIS  Supplemental OT - [facility] and [facility] Secure Chat Preferred   10/31/2022, 7:53 AM

## 2022-10-31 NOTE — Progress Notes (Signed)
PROGRESS NOTE   Subjective/Complaints: No new complaints this morning Discussed nutritionist recommendation to increase TF at night but patient declines as she has been feeling better with lower rate  ROS: +anxiety. See HPI. Denies CP, SOB, abd pain, n/v/d/c, +vertigo, decreased sensation to right hand   Objective:   DG Abd 1 View  Result Date: 10/29/2022 CLINICAL DATA:  Nausea, vomiting. EXAM: ABDOMEN - 1 VIEW COMPARISON:  October 19, 2022. FINDINGS: No abnormal bowel dilatation. Distal tip of feeding tube seen in proximal stomach. Residual contrast noted in nondilated distal colon. IMPRESSION: No abnormal bowel dilatation. Electronically Signed   By: Lupita Raider M.D.   On: 10/29/2022 20:32   Recent Labs    10/29/22 0624  WBC 8.8  HGB 11.8*  HCT 35.7*  PLT 483*   Recent Labs    10/29/22 0624  NA 133*  K 4.9  CL 96*  CO2 27  GLUCOSE 127*  BUN 30*  CREATININE 0.87  CALCIUM 9.0    Intake/Output Summary (Last 24 hours) at 10/31/2022 1026 Last data filed at 10/31/2022 0808 Gross per 24 hour  Intake 195 ml  Output 0 ml  Net 195 ml         Physical Exam: Vital Signs Blood pressure 102/68, pulse 72, temperature 98 F (36.7 C), resp. rate 16, height 5\' 6"  (1.676 m), weight 68.1 kg, SpO2 96%. Constitutional:      Comments: in w/c with PT, NAD HENT:     Head: Normocephalic and atraumatic.     Comments: L facial droop L eye will not close- lower eyelid droops, no patch this morning Cortrak in place- TF's off    Right Ear: External ear normal.     Left Ear: External ear normal.     Nose: Nose normal. No congestion.     Mouth/Throat:     Mouth: Mucous membranes are dry.     Pharynx: Oropharynx is clear. No oropharyngeal exudate.      Eyes:     Comments: Left lower lid everted and erythematous-- patch removed today Cardiovascular:     Rate and Rhythm: Normal rate and regular rhythm.     Heart sounds:  Normal heart sounds. No murmur heard.    No gallop.  Pulmonary: CTAB in all lung fields, no w/r/r, no hypoxia or increased WOB, speaking in full sentences Abdominal:     General: Bowel sounds are normal.     Comments: Dressing over midline incision intact- just changed C/D/I +BS throughout, soft, nonTTP, nondistended.  Neuro: L facial droop, alert, some memory deficits, decreased sensation to right side of face and right hand, +vertebrobasilar insufficiency Musculoskeletal:     Cervical back: Neck supple. No tenderness.     Comments: 5/5 strength in upper extremities and 4/5 in lower extremities stable 10/15 Skin:    General: Skin is warm and dry.     Comments: IV L AC fossa and R forearm- look OK Sweating on face slightly- appears warm, but doesn't feel feverish      Assessment/Plan: 1. Functional deficits which require 3+ hours per day of interdisciplinary therapy in a comprehensive inpatient rehab setting. Physiatrist is providing close team supervision  and 24 hour management of active medical problems listed below. Physiatrist and rehab team continue to assess barriers to discharge/monitor patient progress toward functional and medical goals  Care Tool:  Bathing    Body parts bathed by patient: Left arm, Chest, Abdomen, Face   Body parts bathed by helper: Right arm, Left lower leg, Right lower leg, Buttocks, Front perineal area, Right upper leg, Left upper leg     Bathing assist Assist Level: Maximal Assistance - Patient 24 - 49%     Upper Body Dressing/Undressing Upper body dressing   What is the patient wearing?: Pull over shirt    Upper body assist Assist Level: Moderate Assistance - Patient 50 - 74%    Lower Body Dressing/Undressing Lower body dressing      What is the patient wearing?: Pants     Lower body assist Assist for lower body dressing: Total Assistance - Patient < 25%     Toileting Toileting Toileting Activity did not occur (Clothing management  and hygiene only): N/A (no void or bm)  Toileting assist Assist for toileting: Moderate Assistance - Patient 50 - 74%     Transfers Chair/bed transfer  Transfers assist  Chair/bed transfer activity did not occur: Safety/medical concerns  Chair/bed transfer assist level: Moderate Assistance - Patient 50 - 74%     Locomotion Ambulation   Ambulation assist      Assist level: 2 helpers Assistive device: Walker-rolling Max distance: 168ft   Walk 10 feet activity   Assist     Assist level: 2 helpers Assistive device: Walker-rolling   Walk 50 feet activity   Assist Walk 50 feet with 2 turns activity did not occur: Safety/medical concerns (fatigue)  Assist level: 2 helpers Assistive device: Walker-rolling    Walk 150 feet activity   Assist Walk 150 feet activity did not occur: Safety/medical concerns         Walk 10 feet on uneven surface  activity   Assist Walk 10 feet on uneven surfaces activity did not occur: Safety/medical concerns         Wheelchair     Assist Is the patient using a wheelchair?: Yes Type of Wheelchair: Manual    Wheelchair assist level: Dependent - Patient 0% Max wheelchair distance: 21ft    Wheelchair 50 feet with 2 turns activity    Assist        Assist Level: Dependent - Patient 0%   Wheelchair 150 feet activity     Assist      Assist Level: Dependent - Patient 0%   Blood pressure 102/68, pulse 72, temperature 98 F (36.7 C), resp. rate 16, height 5\' 6"  (1.676 m), weight 68.1 kg, SpO2 96%.  Medical Problem List and Plan: 1. Functional deficits secondary to L PICA/AICA stroke with cerebellar edema             -patient may  shower- if cover incisions             -ELOS/Goals: 10-12 days supervision             -Continue CIR  Head CT reviewed with patient 10/14 and improvements discussed   2.  Antithrombotics: -DVT/anticoagulation:  Pharmaceutical: Heparin 5000U q8h             -antiplatelet  therapy: Plavix 75mg  daily   3. Chronic back pain: kpad ordered. Tylenol as needed             -continue Norco 5/325 one tablet q HS             -  continue Robaxin 500 mg QID   4. Anxiety: lexapro restarted daily at 10mg               -continue trazodone 50 mg q HS-- timing changed to 8pm -continue melatonin 10 mg q HS-- d/c'd but unclear why, not sleeping well, reordered on 10/27/22             -antipsychotic agents: n/a   5. Neuropsych/cognition: This patient is? capable of making decisions on her (since sedated, cannot tell) own behalf.   6. Skin/Wound Care: Routine skin care checks             -continue packing of lower aspect of midline incision BID   7. Fluids/Electrolytes/Nutrition: Routine Is and Os and follow-up chemistries             -continue TF and monitor oral intake             -continue D1 diet with honey thick liquids- just upgraded from puree             -SLP eval             -?? 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 Urinary retention: placed order for foley removal 10/27/22  -10/27/22 foley ordered to be removed today, ordered PVRs to monitor -10/28/22 foley removed, one cath overnight for but otherwise low PVRs--monitor  18. Leukocytosis: WBC reviewed 10/14 and has resolved  19. Nausea: seems to be mostly with  movement/transitions, has zofran ordered but will trial meclizine to see if this helps with the movement aspect (pt doesn't state she's dizzy but how she describes her symptoms seems to have a rotational/movement component); started with 12.5mg  TID PRN, could always go up also.   schedule zofran IV daily at 7am, discussed this helped, continue, messaged nursing regarding last BM since Zofran can be constipating.   20. Vertigo: d/c scopolamine patch, d/c meclizine since not helpful, discussed 2/2 vertebrobasilar insufficiency  21. Lethargy: much improved, diet upgraded to D1/honey, check BMP given honey thick liquids.   22. Decreased appetite: improved, continue  TF at 72mls/hour to encourage oral appetite, much improved and now eating 20% of meals.   LOS: 6 days A FACE TO FACE EVALUATION WAS PERFORMED  Brayden Brodhead P Amberia Bayless 10/31/2022, 10:26 AM

## 2022-10-31 NOTE — Progress Notes (Addendum)
Patient ID: Joan Robinson, female   DOB: 09-28-1960, 62 y.o.   MRN: [id number]  Met with pt to give team conference update regarding goals of supervision-min assist and target discharge date of 10/29. Aware treating UTI with antibiotics if can eat 50 % of meals can stop tube feedings. Will contact daughter to give update also. Message left for daughter await return call.

## 2022-10-31 NOTE — Progress Notes (Signed)
Received call-back from the patient's ophthalmologist, Dr. [name]. She emphasized keeping her lid closed, use Lacri-Lube, continue her glaucoma eyedrops, and gentle cleansing with warm cloth for Lacri-Lube buildup. She also recommends peri-orbital massage to stimulate muscles. Family can do this as well.

## 2022-11-01 DIAGNOSIS — I639 Cerebral infarction, unspecified: Secondary | ICD-10-CM | POA: Diagnosis not present

## 2022-11-01 DIAGNOSIS — E440 Moderate protein-calorie malnutrition: Secondary | ICD-10-CM | POA: Insufficient documentation

## 2022-11-01 LAB — URINE CULTURE: Culture: >=100000 — AB

## 2022-11-01 MED ORDER — LISDEXAMFETAMINE DIMESYLATE 20 MG PO CAPS
20.0000 mg | ORAL_CAPSULE | Freq: Every day | ORAL | Status: DC
  Administered 2022-11-01 – 2022-11-02 (×2): 20 mg via ORAL
  Filled 2022-11-01 (×2): qty 1

## 2022-11-01 MED ORDER — CARBAMIDE PEROXIDE 6.5 % OT SOLN
5.0000 [drp] | Freq: Two times a day (BID) | OTIC | Status: DC
  Administered 2022-11-01 – 2022-11-10 (×8): 5 [drp] via OTIC
  Filled 2022-11-01: qty 15

## 2022-11-01 MED ORDER — SORBITOL 70 % SOLN
15.0000 mL | Freq: Once | Status: AC
  Administered 2022-11-01: 15 mL via ORAL
  Filled 2022-11-01: qty 30

## 2022-11-01 MED ORDER — LISDEXAMFETAMINE DIMESYLATE 10 MG PO CAPS: 10.0000 mg | ORAL_CAPSULE | Freq: Every day | ORAL | Status: DC

## 2022-11-01 MED ORDER — OSMOLITE 1.5 CAL PO LIQD
240.0000 mL | ORAL | Status: DC
  Administered 2022-11-01: 240 mL

## 2022-11-01 NOTE — Progress Notes (Signed)
Speech Language Pathology Weekly Progress and Session Note  Patient Details  Name: TRICA MCELREATH MRN: [id number] Date of Birth: May 29, 1960  Beginning of progress report period: October 25, 2022 End of progress report period: November 01, 2022  Today's Date: 11/01/2022 SLP Individual Time: 0804-0900 SLP Individual Time Calculation (min): 56 min  Short Term Goals: Week 1: SLP Short Term Goal 1 (Week 1): Pt. will participate in bolus trials and assessment of diet tolerance of Dys1/HTLs with SLP and exhibit no overt difficulty with boluses given maxA for use of compensatory/safe swallow strategies SLP Short Term Goal 1 - Progress (Week 1): Met SLP Short Term Goal 2 (Week 1): Patient will demonstrate increased alertness and overall bolus acceptance in order to demonstrate readiness for repeated objective swallow study over 2 sessions. SLP Short Term Goal 2 - Progress (Week 1): Met SLP Short Term Goal 3 (Week 1): Patient will utilize speech intelligibility increasing strategies at the word and phrase levels with maxA in 60% of opportunities. SLP Short Term Goal 3 - Progress (Week 1): Met SLP Short Term Goal 4 (Week 1): Patient will exhibit improved endurance/alertness to participate in further cognitive testing in order to determine full extent of cognitive deficits to aid goal setting for inpatient rehabilitation stay. SLP Short Term Goal 4 - Progress (Week 1): Met  New Short Term Goals: Week 2: SLP Short Term Goal 1 (Week 2): Patient will complete updated swallow study to assess appropriateness for liquid upgrade. SLP Short Term Goal 2 (Week 2): Patient will demonstrate improvement to stroke-induced trismus to promote opening of the mouth for bolus acceptance and speech. SLP Short Term Goal 3 (Week 2): Patient will demonstrate problem solving skills during mildly complex problems in 4/5 opportunities given min assist. SLP Short Term Goal 4 (Week 2): Patient will utilize memory external aids to  recall daily information with 85% accuracy given min assist. SLP Short Term Goal 5 (Week 2): Patient will orient to date and time utilizing external aids with supervision.  Weekly Progress Updates: Patient is making excellent progress towards therapy goals, meeting 4/4 short term goals set this reporting period. Overall, alertness vastly improved with patient participating in cognitive assessment and exhibiting deficits in the areas of problem solving, memory, awareness, and orientation. Patient upgraded to Dys2/HTL diet and has been tolerating POs consistently. Speech wise, patient is 90% intelligible at the conversation level. Patient and family education ongoing. Patient will continue to benefit from skilled therapy services during remainder of CIR stay.   Intensity: Minumum of 1-2 x/day, 30 to 90 minutes Frequency: 3 to 5 out of 7 days Duration/Length of Stay: 10/29 Treatment/Interventions: Cognitive remediation/compensation;Environmental controls;Multimodal communication approach;Cueing hierarchy;Functional tasks;Therapeutic Activities;Internal/external aids;Therapeutic Exercise;Oral motor exercises;Dysphagia/aspiration precaution training;Patient/family education  Daily Session  Skilled Therapeutic Interventions: SLP conducted skilled therapy session targeting dysphagia management and cognitive retraining goals. Patient with continued noticeably improved alertness and improvements to speech. Patient's speech now 90% intelligible at the conversation level with supervision, suspect due to combination of speech intelligibility strategies and improvements to oral motor function overall. Given improvements to oral motor function, assessed patient with dys2 texture with patient exhibiting no overt difficulty. Upgraded patient to Dys2/HTL diet. Given silent aspiration of NTL and thin liquids, patient will remain on HTL until repeated MBS can be utilized to visualize oropharyngeal swallowing function.  During cognitive tasks, SLP introduced WRAP memory strategies and provided patient with handout to facilitate carryover. Patient was left in lowered bed with call bell in reach and bed alarm set.  SLP will continue to target goals per plan of care.       Pain Pain Assessment Pain Scale: 0-10 Pain Score: 0-No pain  Therapy/Group: Individual Therapy  Jeannie Done, M.A., CCC-SLP  Yetta Barre 11/01/2022, 9:13 AM

## 2022-11-01 NOTE — Progress Notes (Signed)
Physical Therapy Session Note  Patient Details  Name: Joan Robinson MRN: [id number] Date of Birth: [birth year]  Today's Date: 11/01/2022 PT Individual Time: 1020-1100 PT Individual Time Calculation (min): 40 min   Short Term Goals: Week 1:  PT Short Term Goal 1 (Week 1): Pt will complete bed mobility with CGA PT Short Term Goal 2 (Week 1): Pt will complete bed<>chair transfers with CGA and LRAD PT Short Term Goal 3 (Week 1): Pt will ambulate 139ft with minA and LRAD  Skilled Therapeutic Interventions/Progress Updates:     Pt received supinein bed and agrees to therapy. NO complaint of pain. Supine to sit with cues for positioning and sequencing. Pt performs stand pivot to [facility] with CGA/minA and cues for initiation, sequencing, and positioning. WC transport to gym. Pt performs sit to stand with CGA, then ambulates x90' without AD, requiring heavy modA due to consistent Lt sided bias that pt only abel to partially correct with cues. PT provides verbal and tactile cues for posture, lateral weight shifting, and step sequencing. Following rest break, pt completes additinoal 22' ambulation with PT positioned on pt's Rt side to provide tactile cueing on Rt side with verbal cue to "maintain contact between your Rt shoulder and my side." Pt still requires modA but decreases severity of Lt sided lean in response to cueing. Following rest break, PT positions pt against column with her Rt shoulder against column and mirror positioned for visual feedback. Pt performs foot taps on 6 inch step while maintaining Rt shoulder and hip contact with column. PT provides CGA/minA for balance, but pt does not cross midline to the Lt. Occasional cues required to bring attention to Rt hip to shift back toward wall. WC transport back to room. Stand pivot to bed with CGA/minA. Left supine with alarm intact and all needs within reach.   Therapy Documentation Precautions:  Precautions Precautions: Fall Precaution Comments:  02, cortrak, indwelling cath, vision deficit, ataxia Restrictions Weight Bearing Restrictions: No    Therapy/Group: Individual Therapy  Beau Fanny, PT, DPT 11/01/2022, 4:00 PM

## 2022-11-01 NOTE — Plan of Care (Signed)
Plan of Care Upgraded given progress made thus far:  Problem: RH Swallowing Goal: LTG Patient will consume least restrictive diet using compensatory strategies with assistance (SLP) Description: LTG:  Patient will consume least restrictive diet using compensatory strategies with assistance (SLP) Flowsheets (Taken 11/01/2022 1230) LTG: Pt Patient will consume least restrictive diet using compensatory strategies with assistance of (SLP): Supervision   Problem: RH Expression Communication Goal: LTG Patient will increase speech intelligibility (SLP) Description: LTG: Patient will increase speech intelligibility at word/phrase/conversation level with cues, % of the time (SLP) Flowsheets Taken 11/01/2022 1230 LTG: Patient will increase speech intelligibility (SLP): Supervision Level: Conversation level Taken 10/26/2022 1341 Percent of time patient will use intelligible speech: 80%   Problem: RH Attention Goal: LTG Patient will demonstrate this level of attention during functional activites (SLP) Description: LTG:  Patient will will demonstrate this level of attention during functional activites (SLP) Flowsheets Taken 11/01/2022 1230 Patient will demonstrate during cognitive/linguistic activities the attention type of: Selective LTG: Patient will demonstrate this level of attention during cognitive/linguistic activities with assistance of (SLP): Supervision Taken 10/26/2022 1341 Patient will demonstrate this level of attention during cognitive/linguistic activities in: Controlled Number of minutes patient will demonstrate attention during cognitive/linguistic activities: 15   Problem: RH Awareness Goal: LTG: Patient will demonstrate awareness during functional activites type of (SLP) Description: LTG: Patient will demonstrate awareness during functional activites type of (SLP) Flowsheets Taken 11/01/2022 1230 LTG: Patient will demonstrate awareness during cognitive/linguistic activities  with assistance of (SLP): Supervision Taken 10/26/2022 1341 Patient will demonstrate during cognitive/linguistic activities awareness type of: Intellectual   Problem: RH Problem Solving Goal: LTG Patient will demonstrate problem solving for (SLP) Description: LTG:  Patient will demonstrate problem solving for basic/complex daily situations with cues  (SLP) Flowsheets (Taken 11/01/2022 1230) LTG: Patient will demonstrate problem solving for (SLP): (mildly complex) -- LTG Patient will demonstrate problem solving for: Supervision   Problem: RH Memory Goal: LTG Patient will use memory compensatory aids to (SLP) Description: LTG:  Patient will use memory compensatory aids to recall biographical/new, daily complex information with cues (SLP) Flowsheets (Taken 11/01/2022 1230) LTG: Patient will use memory compensatory aids to (SLP): Supervision

## 2022-11-01 NOTE — Progress Notes (Signed)
PROGRESS NOTE   Subjective/Complaints: C/o decreased hearing left ear: debrox ear drops ordered Advanced to D2/honey thick! She would like to continue weaning TF, feeling better  ROS: +anxiety. See HPI. Denies CP, SOB, abd pain, n/v/d/c, +vertigo, decreased sensation to right hand, +decreased hearing from left ear   Objective:   No results found. No results for input(s): "WBC", "HGB", "HCT", "PLT" in the last 72 hours.  Recent Labs    10/31/22 1118  NA 135  K 4.8  CL 97*  CO2 27  GLUCOSE 122*  BUN 27*  CREATININE 0.97  CALCIUM 9.8    Intake/Output Summary (Last 24 hours) at 11/01/2022 1030 Last data filed at 11/01/2022 0945 Gross per 24 hour  Intake 120 ml  Output --  Net 120 ml         Physical Exam: Vital Signs Blood pressure 90/65, pulse 77, temperature (!) 97.4 F (36.3 C), resp. rate 18, height 5\' 6"  (1.676 m), weight 67.7 kg, SpO2 97%. Constitutional:      Comments: in w/c with PT, NAD HENT:     Head: Normocephalic and atraumatic.     Comments: L facial droop L eye will not close- lower eyelid droops, no patch this morning Cortrak in place- TF's off    Right Ear: External ear normal.     Left Ear: External ear normal.     Nose: Nose normal. No congestion.     Mouth/Throat:     Mouth: Mucous membranes are dry.     Pharynx: Oropharynx is clear. No oropharyngeal exudate.      Eyes:     Comments: Left lower lid everted and erythematous-- patch removed today Cardiovascular:     Rate and Rhythm: Normal rate and regular rhythm.     Heart sounds: Normal heart sounds. No murmur heard.    No gallop.  Pulmonary: CTAB in all lung fields, no w/r/r, no hypoxia or increased WOB, speaking in full sentences Abdominal:     General: Bowel sounds are normal.     Comments: Dressing over midline incision intact- just changed C/D/I +BS throughout, soft, nonTTP, nondistended.  Neuro: L facial droop, decreased  hearing left ear. Alert, some memory deficits, decreased sensation to right side of face and right hand, +vertebrobasilar insufficiency Musculoskeletal:     Cervical back: Neck supple. No tenderness.     Comments: 5/5 strength in upper extremities and 4/5 in lower extremities otherwise stable 10/17 Skin:    General: Skin is warm and dry.     Comments: IV L AC fossa and R forearm- look OK Sweating on face slightly- appears warm, but doesn't feel feverish      Assessment/Plan: 1. Functional deficits which require 3+ hours per day of interdisciplinary therapy in a comprehensive inpatient rehab setting. Physiatrist is providing close team supervision and 24 hour management of active medical problems listed below. Physiatrist and rehab team continue to assess barriers to discharge/monitor patient progress toward functional and medical goals  Care Tool:  Bathing    Body parts bathed by patient: Left arm, Chest, Abdomen, Face   Body parts bathed by helper: Right arm, Left lower leg, Right lower leg, Buttocks, Front perineal  area, Right upper leg, Left upper leg     Bathing assist Assist Level: Maximal Assistance - Patient 24 - 49%     Upper Body Dressing/Undressing Upper body dressing   What is the patient wearing?: Pull over shirt    Upper body assist Assist Level: Moderate Assistance - Patient 50 - 74%    Lower Body Dressing/Undressing Lower body dressing      What is the patient wearing?: Pants     Lower body assist Assist for lower body dressing: Total Assistance - Patient < 25%     Toileting Toileting Toileting Activity did not occur (Clothing management and hygiene only): N/A (no void or bm)  Toileting assist Assist for toileting: Moderate Assistance - Patient 50 - 74%     Transfers Chair/bed transfer  Transfers assist  Chair/bed transfer activity did not occur: Safety/medical concerns  Chair/bed transfer assist level: Moderate Assistance - Patient 50 - 74%      Locomotion Ambulation   Ambulation assist      Assist level: 2 helpers Assistive device: Walker-rolling Max distance: 133ft   Walk 10 feet activity   Assist     Assist level: 2 helpers Assistive device: Walker-rolling   Walk 50 feet activity   Assist Walk 50 feet with 2 turns activity did not occur: Safety/medical concerns (fatigue)  Assist level: 2 helpers Assistive device: Walker-rolling    Walk 150 feet activity   Assist Walk 150 feet activity did not occur: Safety/medical concerns         Walk 10 feet on uneven surface  activity   Assist Walk 10 feet on uneven surfaces activity did not occur: Safety/medical concerns         Wheelchair     Assist Is the patient using a wheelchair?: Yes Type of Wheelchair: Manual    Wheelchair assist level: Dependent - Patient 0% Max wheelchair distance: 50ft    Wheelchair 50 feet with 2 turns activity    Assist        Assist Level: Dependent - Patient 0%   Wheelchair 150 feet activity     Assist      Assist Level: Dependent - Patient 0%   Blood pressure 90/65, pulse 77, temperature (!) 97.4 F (36.3 C), resp. rate 18, height 5\' 6"  (1.676 m), weight 67.7 kg, SpO2 97%.  Medical Problem List and Plan: 1. Functional deficits secondary to L PICA/AICA stroke with cerebellar edema             -patient may  shower- if cover incisions             -ELOS/Goals: 10-12 days supervision             -Continue CIR  Head CT reviewed with patient 10/14 and improvements discussed   2.  Antithrombotics: -DVT/anticoagulation:  Pharmaceutical: Heparin 5000U q8h             -antiplatelet therapy: Plavix 75mg  daily   3. Chronic back pain: kpad ordered. Tylenol as needed             -continue Norco 5/325 one tablet q HS             -continue Robaxin 500 mg QID   4. Anxiety: lexapro restarted daily at 10mg               -continue trazodone 50 mg q HS-- timing changed to 8pm -continue melatonin 10 mg q  HS-- d/c'd but unclear why, not sleeping well, reordered on 10/27/22             -  antipsychotic agents: n/a   5. Neuropsych/cognition: This patient is? capable of making decisions on her (since sedated, cannot tell) own behalf.   6. Skin/Wound Care: Routine skin care checks             -continue packing of lower aspect of midline incision BID   7. Fluids/Electrolytes/Nutrition: Routine Is and Os and follow-up chemistries             -continue TF and monitor oral intake             -continue D1 diet with honey thick liquids- just upgraded from puree             -SLP eval             -?? 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 Urinary retention: placed order for foley removal 10/27/22  -10/27/22 foley ordered to be removed today, ordered PVRs to monitor -10/28/22 foley removed, one cath overnight for but otherwise low PVRs--monitor  18. Leukocytosis: WBC reviewed 10/14 and has resolved  19. Nausea: seems to be mostly with movement/transitions, has zofran ordered but will trial meclizine to see if this helps with the movement aspect (pt doesn't state she's dizzy but how she describes her symptoms seems to have a rotational/movement component); started with 12.5mg  TID PRN, could always go up also.   schedule zofran IV daily at 7am, discussed this  helped, continue, messaged nursing regarding last BM since Zofran can be constipating.   20. Vertigo: d/c scopolamine patch, d/c meclizine since not helpful, discussed 2/2 vertebrobasilar insufficiency  21. Lethargy: much improved, diet upgraded to D2/honey, BUN reviewed and improved  22. Decreased appetite: improved, decrease TF to 58mls/hour to encourage oral appetite, much improved and now eating 20% of meals. Messaged nursing to educate patient that if she can eat 50% of lunch we can remove Cortrak, discussed with patient and this is her goal.   23. Decreased hearing left ear: discussed may be due to CN 8 being affected by stroke. Debrox ear drops ordered in case wax buildup is contributing  24. ADHD: Vyvanse started at 20mg  daily  LOS: 7 days A FACE TO FACE EVALUATION WAS PERFORMED  Jerlean Peralta P Mallika Sanmiguel 11/01/2022, 10:30 AM

## 2022-11-01 NOTE — Progress Notes (Signed)
Physical Therapy Session Note  Patient Details  Name: Joan Robinson MRN: [id number] Date of Birth: 1960-12-19  Today's Date: 11/01/2022 PT Individual Time: 1300-1329 PT Individual Time Calculation (min): 29 min   Short Term Goals: Week 1:  PT Short Term Goal 1 (Week 1): Pt will complete bed mobility with CGA PT Short Term Goal 2 (Week 1): Pt will complete bed<>chair transfers with CGA and LRAD PT Short Term Goal 3 (Week 1): Pt will ambulate 136ft with minA and LRAD  Skilled Therapeutic Interventions/Progress Updates:       Pt lying in bed - reports she got "sick" with PT earlier this AM. Pt denies any pain. Pt's L eye not taped shut, still is wearing the eye patch with gauze. Care team notified via secure chat. Pt reports being frustrated with her dizziness that is affecting her tolerance to mobility.   Supine<>sitting EOB with CGA. Completes stand step transfer with minA and no AD to w/c. Assist for managing her L trunk lean.  Transported to day room rehab gym. Instructed in gait training where she ambulated 132ft with minA and RW - gait is ataxic with narrow BOS, adducting on L, and trunk lean L. Cues for correcting throughout.   Assisted patient into quadruped position on mat table with minA. Worked on unilateral arm raises (alternating) and unilateral leg extensions (alternating). Pt more ataxic in her L leg than L arm. MinA for trunk stability but no buckling in either hips or shoulder/elbow.   Pt returned to her room and assisted back ot bed and left with alarm on, call bell in reach.   Therapy Documentation Precautions:  Precautions Precautions: Fall Precaution Comments: 02, cortrak, indwelling cath, vision deficit, ataxia Restrictions Weight Bearing Restrictions: No General:     Therapy/Group: Individual Therapy  Orrin Brigham 11/01/2022, 7:44 AM

## 2022-11-01 NOTE — Progress Notes (Signed)
Patient ID: Joan Robinson, female   DOB: August 31, 1960, 62 y.o.   MRN: [id number] Met with pt and daughter who was present to give them the team conference update goals of supervision-min level and target discharge date of 10/29. Both have concerns regarding her eye and wanted outcome of speaking with ophthalmologist note given to daughter. Also expressed concern regarding Mom's inability to hear out of her left ear. Have messaged MD and team regarding this. Gave them SSD website to apply for disability online along with any services via [employer], etc. Pt and daughter to follow up with. Aware will need to have caregivers come in closer to discharge to do hands on education with pt. Husband is currently sick at home and going to the MD today. Aware of the conference results. Will continue to work on discharge needs.

## 2022-11-01 NOTE — Progress Notes (Signed)
Occupational Therapy Session Note  Patient Details  Name: Joan Robinson MRN: [id number] Date of Birth: 1960-11-04  Today's Date: 11/01/2022 OT Individual Time: 4098-1191 OT Individual Time Calculation (min): 71 min    Short Term Goals: Week 1:  OT Short Term Goal 1 (Week 1): Patient will maintain arousal x 45 min during BADL activity OT Short Term Goal 2 (Week 1): Patient will complete upper boody bathing with min assist OT Short Term Goal 3 (Week 1): Patient will complete lower body bathing with mod assist OT Short Term Goal 4 (Week 1): Patient will don/doff pull over shirt with min assist OT Short Term Goal 5 (Week 1): Patient will don pants with mod assist  Skilled Therapeutic Interventions/Progress Updates:  Pt greeted supine in bed, pt agreeable to OT intervention.    Session focused on BADL reeducation, increasing activity tolerance, and LUE FMC  Transfers/bed mobility: pt completed supine<>sit with CGA. Pt completed stand pivot transfers with RW and MINA. Pt completed stand pivot to toilet with MIN A with use of grab bars.   Therapeutic activity: pt completed seated FMC task to facilitate bimanual integration motor relearning with pt instructed to fold wash cloths and then use LUE to place weighted clothespin around cloth. Pt completed task with supervision, LUE overall WFL but noted incoordination and decreased speed/accuracy when completing task.    ADLs:  Grooming: pt able to complete oral care at sink with set- up assist. Washed pts hair in sink to facilitate improved OOB tolerance,and provide therapeutic self care. Pt able to direct level of care educating this therapist on her typical ADL routine.  UB dressing:pt donned [place] shirt with set- up assist LB dressing: pt donned pants and pull up briefs with CGA.    Bathing: pt completed bathing at sink with overall MIN A for balance when standing to wash LB Transfers: pt completed stand pivot to toilet with MIN A with use of  grab bars.  Toileting: pt completed 3/3 toileting tasks with MIN A with pt completing continent urine void. MIN A needed for balance when pulling pants to waist line.    Ended session with pt supine in bed with all needs within reach and bed alarm activated.                    Therapy Documentation Precautions:  Precautions Precautions: Fall Precaution Comments: 02, cortrak, indwelling cath, vision deficit, ataxia Restrictions Weight Bearing Restrictions: No  Pain:no pain    Therapy/Group: Individual Therapy  Barron Schmid 11/01/2022, 3:23 PM

## 2022-11-02 DIAGNOSIS — I639 Cerebral infarction, unspecified: Secondary | ICD-10-CM | POA: Diagnosis not present

## 2022-11-02 MED ORDER — OSMOLITE 1.5 CAL PO LIQD
240.0000 mL | ORAL | Status: DC
  Administered 2022-11-02 – 2022-11-05 (×4): 240 mL
  Filled 2022-11-02: qty 474

## 2022-11-02 MED ORDER — LISDEXAMFETAMINE DIMESYLATE 30 MG PO CAPS
30.0000 mg | ORAL_CAPSULE | Freq: Every day | ORAL | Status: DC
  Administered 2022-11-03 – 2022-11-06 (×4): 30 mg via ORAL
  Filled 2022-11-02 (×4): qty 1

## 2022-11-02 NOTE — Progress Notes (Signed)
Physical Therapy Weekly Progress Note  Patient Details  Name: Joan Robinson MRN: [id number] Date of Birth: 1960/04/27  Beginning of progress report period: October 26, 2022 End of progress report period: November 02, 2022  Patient has met 2 of 3 short term goals.  Patient is making appropriate progress towards therapy goals and functional mobility. She is currently supervision for bed mobility, CGA for sit<>stands, minA for stand pivots, and min/modA for ambulating >176ft with a RW. She continues to be primarily limited by L sided ataxia, L trunk lean in standing, vision impairments, dizziness/nausea, and generalized deconditioning and weakness. She remains highly motivated during therapy sessions.  Patient continues to demonstrate the following deficits muscle weakness, decreased cardiorespiratoy endurance, abnormal tone, unbalanced muscle activation, motor apraxia, ataxia, and decreased coordination, decreased visual acuity, decreased visual perceptual skills, and decreased visual motor skills, decreased attention to left, and decreased sitting balance, decreased standing balance, decreased postural control, hemiplegia, and decreased balance strategies and therefore will continue to benefit from skilled PT intervention to increase functional independence with mobility.  Patient progressing toward long term goals..  Continue plan of care.  PT Short Term Goals Week 1:  PT Short Term Goal 1 (Week 1): Pt will complete bed mobility with CGA PT Short Term Goal 1 - Progress (Week 1): Met PT Short Term Goal 2 (Week 1): Pt will complete bed<>chair transfers with CGA and LRAD PT Short Term Goal 2 - Progress (Week 1): Progressing toward goal PT Short Term Goal 3 (Week 1): Pt will ambulate 138ft with minA and LRAD PT Short Term Goal 3 - Progress (Week 1): Met Week 2:  PT Short Term Goal 1 (Week 2): Pt will complete bed<>chair transfers with CGA and LRAD PT Short Term Goal 2 (Week 2): Pt will ambulate  146ft with minA and LRAD PT Short Term Goal 3 (Week 2): Pt will initiate stair training   Therapy Documentation Precautions:  Precautions Precautions: Fall Precaution Comments: 02, cortrak, indwelling cath, vision deficit, ataxia Restrictions Weight Bearing Restrictions: No General:     Seeley Hissong P Marqueze Ramcharan PT 11/02/2022, 7:41 AM

## 2022-11-02 NOTE — Progress Notes (Signed)
Physical Therapy Session Note  Patient Details  Name: Joan Robinson MRN: [id number] Date of Birth: 11-16-1960  Today's Date: 11/02/2022 PT Individual Time: 8295-6213 PT Individual Time Calculation (min): 41 min   Short Term Goals: Week 2:  PT Short Term Goal 1 (Week 2): Pt will complete bed<>chair transfers with CGA and LRAD PT Short Term Goal 2 (Week 2): Pt will ambulate 152ft with minA and LRAD PT Short Term Goal 3 (Week 2): Pt will initiate stair training  Skilled Therapeutic Interventions/Progress Updates:    Pt received supported up in bed with her son, [name], present and pt agreeable to therapy session. Pt moves quickly, a little impulsively, to start coming to sit on R EOB before therapist moves bedrail - CGA for safety. Sitting EOB, donned shoes min A.   Sit>stand EOB>RW with min A for balance.   Gait training ~177ft using RW to main therapy gym with progressively heavier mod assist required for balance and AD management. Pt demonstrating the following gait deviations with therapist providing the described cuing and facilitation for improvement:  - L LE scissoring during swing with ataxic/incoordination steps (tend to be too big and scissors) - strong L anterior lean with worsening L forward trunk flexion with fatigue  - poor awareness of L lateral lean with lack of efforts to correct  - poor control of AD requiring manual facilitation to increase stability  Pt reports she has significantly delayed awareness of when she is starting to have L lean and reports she cannot really feel when she is leaning on therapist.  Dynamic gait training using mirror feedback with L UE around therapist's shoulders and +2 R HHA progressed to only +2 guarding on R side for safety:  - forward walking 3x 14ft - backwards x14ft Repeated the same as above but progressed to without +2 guarding.  Therapist providing pt space to have minor L LOB and cuing to use visual feedback to increase awareness  of the lean and allow pt to correct back to midline as much as safely possible. Cuing throughout to improve R weight shift and keep L LE abducted to avoid scissoring. Pt slowly improving to have slightly less of a lean with repetition and benefits from visual feedback despite her stating it doesn't make a difference. During backwards gait pt takes excessively large L step backwards with slight scissoring there as well, requiring cuing to take smaller and wider steps.  Pt reports onset of "lightheadedness" when walking without AD during above gait training. Recommend assessing for orthostatic hypotension.   Gait training back to room, L UE support around therapist's shoulders to help control L anterior trunk  lean, with continuous min A for balance and facilitating weight shifting to R during R stance phase with intermittent light mod A for balance when pt has increased incoordination of L LE swing advancement.  Cuing throughout session to turn and sit more slow and controlled due to pt rushing to sit when fatigued.   Pt left R sidelying in bed with needs in reach, bed alarm on, and her son present.   Therapy Documentation Precautions:  Precautions Precautions: Fall Precaution Comments: 02, cortrak, indwelling cath, vision deficit, ataxia Restrictions Weight Bearing Restrictions: No   Pain: No reports of pain throughout session.    Therapy/Group: Individual Therapy  [name] , PT, DPT, NCS, CSRS 11/02/2022, 3:22 PM

## 2022-11-02 NOTE — Plan of Care (Signed)
[facility] Tonsillectomy/Adenoidectomy, Postoperative PEDS care plan entered in error.

## 2022-11-02 NOTE — Progress Notes (Signed)
PROGRESS NOTE   Subjective/Complaints: Continues to feel better Son is visiting today Would like to continue weaning off TF and discussed plan for MBS Monday  ROS: +anxiety. See HPI. Denies CP, SOB, abd pain, +vertigo, decreased sensation to right hand, +decreased hearing from left ear, nausea improved   Objective:   No results found. No results for input(s): "WBC", "HGB", "HCT", "PLT" in the last 72 hours.  Recent Labs    10/31/22 1118  NA 135  K 4.8  CL 97*  CO2 27  GLUCOSE 122*  BUN 27*  CREATININE 0.97  CALCIUM 9.8    Intake/Output Summary (Last 24 hours) at 11/02/2022 1031 Last data filed at 11/01/2022 1700 Gross per 24 hour  Intake 0 ml  Output --  Net 0 ml         Physical Exam: Vital Signs Blood pressure 101/64, pulse 74, temperature (!) 97.5 F (36.4 C), temperature source Oral, resp. rate 17, height 5\' 6"  (1.676 m), weight 67.7 kg, SpO2 98%. Constitutional:      Comments: in w/c with PT, NAD HENT:     Head: Normocephalic and atraumatic.     Comments: L facial droop L eye will not close- lower eyelid droops, no patch this morning Cortrak in place- TF's off    Right Ear: External ear normal.     Left Ear: External ear normal.     Nose: Nose normal. No congestion.     Mouth/Throat:     Mouth: Mucous membranes are dry.     Pharynx: Oropharynx is clear. No oropharyngeal exudate.      Eyes:     Comments: Left lower lid everted and erythematous-- patch removed today Cardiovascular:     Rate and Rhythm: Normal rate and regular rhythm.     Heart sounds: Normal heart sounds. No murmur heard.    No gallop.  Pulmonary: CTAB in all lung fields, no w/r/r, no hypoxia or increased WOB, speaking in full sentences Abdominal:     General: Bowel sounds are normal.     Comments: Dressing over midline incision intact- just changed C/D/I +BS throughout, soft, nonTTP, nondistended.  Neuro: L facial droop,  decreased hearing left ear. Alert, some memory deficits, decreased sensation to right side of face and right hand, +vertebrobasilar insufficiency Musculoskeletal:     Cervical back: Neck supple. No tenderness.     Comments: 5/5 strength in upper extremities and 4/5 in lower extremities otherwise stable 10/18 Skin:    General: Skin is warm and dry.     Comments: IV L AC fossa and R forearm- look OK      Assessment/Plan: 1. Functional deficits which require 3+ hours per day of interdisciplinary therapy in a comprehensive inpatient rehab setting. Physiatrist is providing close team supervision and 24 hour management of active medical problems listed below. Physiatrist and rehab team continue to assess barriers to discharge/monitor patient progress toward functional and medical goals  Care Tool:  Bathing    Body parts bathed by patient: Right arm, Left arm, Chest, Abdomen, Front perineal area, Buttocks, Right upper leg, Left upper leg, Right lower leg, Face   Body parts bathed by helper: Right arm, Left  lower leg, Right lower leg, Buttocks, Front perineal area, Right upper leg, Left upper leg     Bathing assist Assist Level: Minimal Assistance - Patient > 75%     Upper Body Dressing/Undressing Upper body dressing   What is the patient wearing?: Pull over shirt    Upper body assist Assist Level: Set up assist    Lower Body Dressing/Undressing Lower body dressing      What is the patient wearing?: Pants, Underwear/pull up     Lower body assist Assist for lower body dressing: Contact Guard/Touching assist     Toileting Toileting Toileting Activity did not occur (Clothing management and hygiene only): N/A (no void or bm)  Toileting assist Assist for toileting: Minimal Assistance - Patient > 75%     Transfers Chair/bed transfer  Transfers assist  Chair/bed transfer activity did not occur: Safety/medical concerns  Chair/bed transfer assist level: Minimal Assistance -  Patient > 75%     Locomotion Ambulation   Ambulation assist      Assist level: 2 helpers Assistive device: Walker-rolling Max distance: 145ft   Walk 10 feet activity   Assist     Assist level: 2 helpers Assistive device: Walker-rolling   Walk 50 feet activity   Assist Walk 50 feet with 2 turns activity did not occur: Safety/medical concerns (fatigue)  Assist level: 2 helpers Assistive device: Walker-rolling    Walk 150 feet activity   Assist Walk 150 feet activity did not occur: Safety/medical concerns         Walk 10 feet on uneven surface  activity   Assist Walk 10 feet on uneven surfaces activity did not occur: Safety/medical concerns         Wheelchair     Assist Is the patient using a wheelchair?: Yes Type of Wheelchair: Manual    Wheelchair assist level: Dependent - Patient 0% Max wheelchair distance: 41ft    Wheelchair 50 feet with 2 turns activity    Assist        Assist Level: Dependent - Patient 0%   Wheelchair 150 feet activity     Assist      Assist Level: Dependent - Patient 0%   Blood pressure 101/64, pulse 74, temperature (!) 97.5 F (36.4 C), temperature source Oral, resp. rate 17, height 5\' 6"  (1.676 m), weight 67.7 kg, SpO2 98%.  Medical Problem List and Plan: 1. Functional deficits secondary to L PICA/AICA stroke with cerebellar edema             -patient may  shower- if cover incisions             -ELOS/Goals: 10-12 days supervision             -Continue CIR  Head CT reviewed with patient 10/14 and improvements discussed   2.  Antithrombotics: -DVT/anticoagulation:  Pharmaceutical: Heparin 5000U q8h             -antiplatelet therapy: Plavix 75mg  daily   3. Chronic back pain: kpad ordered. Tylenol as needed             -continue Norco 5/325 one tablet q HS             -continue Robaxin 500 mg QID   4. Anxiety: lexapro restarted daily at 10mg               -continue trazodone 50 mg q HS-- timing  changed to 8pm -continue melatonin 10 mg q HS-- d/c'd but unclear why, not sleeping well,  reordered on 10/27/22             -antipsychotic agents: n/a   5. Neuropsych/cognition: This patient is? capable of making decisions on her (since sedated, cannot tell) own behalf.   6. Skin/Wound Care: Routine skin care checks             -continue packing of lower aspect of midline incision BID   7. 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 Urinary retention: placed order for foley removal 10/27/22  -10/27/22 foley ordered to be removed today, ordered PVRs to monitor -10/28/22 foley removed, one cath overnight for but otherwise low PVRs--monitor  18. Leukocytosis: WBC reviewed 10/14 and has resolved  19. Nausea: seems to be mostly with movement/transitions, has zofran ordered but will trial meclizine to see if this helps with the movement aspect (pt doesn't state she's dizzy but how she describes her symptoms seems to have a rotational/movement component); started with 12.5mg  TID PRN, could always go up also.   schedule zofran IV daily at 7am,  discussed this helped, continue, messaged nursing regarding last BM since Zofran can be constipating.   20. Vertigo: d/c scopolamine patch, d/c meclizine since not helpful, discussed 2/2 vertebrobasilar insufficiency  21. Lethargy: much improved, diet upgraded to D2/honey, BUN reviewed and improved  22. Decreased appetite: improved, decrease TF to 47mls/hour to encourage oral appetite, much improved and now eating 20% of meals. Messaged nursing to educate patient that if she can eat 50% of lunch we can remove Cortrak, discussed with patient and this is her goal.   23. Decreased hearing left ear: discussed may be due to CN 8 being affected by stroke. Debrox ear drops ordered in case wax buildup is contributing  24. ADHD: Increase Vyvanse to 30mg  daily.   25. Constipation: d/c scheduled zofran since nausea has resolved  LOS: 8 days A FACE TO FACE EVALUATION WAS PERFORMED  Clint Bolder P Notnamed Scholz 11/02/2022, 10:31 AM

## 2022-11-02 NOTE — Progress Notes (Signed)
Physical Therapy Session Note  Patient Details  Name: Joan Robinson MRN: [id number] Date of Birth: 11-11-1960  Today's Date: 11/02/2022 PT Individual Time: 0800-0858 PT Individual Time Calculation (min): 58 min   Short Term Goals: Week 1:  PT Short Term Goal 1 (Week 1): Pt will complete bed mobility with CGA PT Short Term Goal 2 (Week 1): Pt will complete bed<>chair transfers with CGA and LRAD PT Short Term Goal 3 (Week 1): Pt will ambulate 161ft with minA and LRAD  Skilled Therapeutic Interventions/Progress Updates:      Pt in bed to start - no reports of pain. Pt thankful for her care.   Supine<>sitting EOB with supervision with HOB raised. Pt able to take a few bites of her breakfast - reports she's a "picky eater." Encouraged her to have family bring food that is compliant with D2 diet to improve her intake and nutrition.  Sit<>stand and stand pivot transfer with minA to w/c.   Transported to main rehab gym for time management. Sit<>Stand to RW with CGA and minA for standing balance due to L lean. She ambulated ~115ft + ~249ft with min/modA of 1 person, assist really only for her L trunk lean. Tried cueing her for keeping R side next to wall while ambulating but this was difficult for her. She also adducts her L foot resulting in narrow BOS.   Completed car transfer with minA level using the RW and cues for setup and sequencing. Gait training on a ramp (pt reports family has a temporary ramp at home) and she did so at [facility] level using the RW. Similar deficits as above.   Pt ended session seated in recliner. Provided her with a chair pad alarm rather than seat belt alarm. All needs met with call bell in reach.   Therapy Documentation Precautions:  Precautions Precautions: Fall Precaution Comments: 02, cortrak, indwelling cath, vision deficit, ataxia Restrictions Weight Bearing Restrictions: No General:     Therapy/Group: Individual Therapy  Orrin Brigham 11/02/2022, 7:41 AM

## 2022-11-02 NOTE — Progress Notes (Signed)
Physical Therapy Session Note  Patient Details  Name: Joan Robinson MRN: [id number] Date of Birth: 1960/10/19  Today's Date: 11/02/2022 PT Individual Time: 1300-1341 PT Individual Time Calculation (min): 41 min   Short Term Goals: Week 2:  PT Short Term Goal 1 (Week 2): Pt will complete bed<>chair transfers with CGA and LRAD PT Short Term Goal 2 (Week 2): Pt will ambulate 11ft with minA and LRAD PT Short Term Goal 3 (Week 2): Pt will initiate stair training  Skilled Therapeutic Interventions/Progress Updates:      Pt presents in bed with her son, [name], at the bedside. Family asking about wooden vs metal ramp for home entrance. Discussed some pro's and con's of both.   Supine<>sitting EOB with supervision with hospital bed features. Sit<>stand to RW with CGA and then minA for static standing balance. Gait training ~140ft with min/modA and RW from her room to main rehab gym. Gait continues to be ataxic with L sided trunk lean and narrow BOS with LLE adducting.   Introduced Museum/gallery curator using 2 hand rails and 6" steps. She navigated up/down x8 without a rest break with minA. Completed while forward facing with step-to pattern. Cues for upright posture as she tends to forward flex at the hips due to fear of falling.   Continued to work on Investment banker, operational - tried without RW with PT facing her and patient holding PT elbow's to use visual cue for upright - modA overall for ambulating ~157ft. Added a weighted vest (unsure of weight, at least 20lb) to see if this would improve proprioception and postural awareness with gait. She had a mild improvement with this, fluctuating b/w CGA to modA for balance while using the RW, ambulating variable distances ~100-183ft.   Returned to her room and patient left in bed at end of treatment. Family and friends at the bedside. Alarm on.  Therapy Documentation Precautions:  Precautions Precautions: Fall Precaution Comments: 02, cortrak, indwelling cath,  vision deficit, ataxia Restrictions Weight Bearing Restrictions: No General:     Therapy/Group: Individual Therapy  Latecia Miler P Rashawn Rolon 11/02/2022, 1:39 PM

## 2022-11-02 NOTE — Progress Notes (Signed)
Occupational Therapy Session Note  Patient Details  Name: Joan Robinson MRN: [id number] Date of Birth: 12/14/1960  Today's Date: 11/02/2022 OT Individual Time: 1005-1059 OT Individual Time Calculation (min): 54 min    Short Term Goals: Week 1:  OT Short Term Goal 1 (Week 1): Patient will maintain arousal x 45 min during BADL activity OT Short Term Goal 2 (Week 1): Patient will complete upper boody bathing with min assist OT Short Term Goal 3 (Week 1): Patient will complete lower body bathing with mod assist OT Short Term Goal 4 (Week 1): Patient will don/doff pull over shirt with min assist OT Short Term Goal 5 (Week 1): Patient will don pants with mod assist  Skilled Therapeutic Interventions/Progress Updates:     Pt received sitting up in recliner, dressed and ready for the day with all BADL needs met. Pt presenting to be in good spirits receptive to skilled OT session reporting 0/10 pain- OT offering intermittent rest breaks, repositioning, and therapeutic support to optimize participation in therapy session. Focus this session functional mobility training, body awareness, activity tolerance, and UB strengthening.   Engaged Pt in completing functional mobility to therapy gym using RW for endurance and functional mobility training. Pt able to ambulate ~100 ft with min/mod A for balance and RW management d/t heavy L lean, decreased proprioception, and L hemi-body ataxia with mod improvement with verbal/tactile cues.   Engaged pt in dynamic standing balance stepping activity with large vertical mirror positioned anterior to Pt for increased visual feedback. Pt instructed to complete single leg steps onto cone positioned in front of Pt using RW for balance to work on single leg balance, weight shifting, body awareness, and coordination. Pt able to complete 2x10 trials R/L with min A provided for balance, however pt heavily relying on B UE supported on RW requiring max verbal cues to correct.  Graded up activity to alternating stepping with R/L with light mod A provided for balance d/t L lateral lean.   Engaged Pt in seated beach ball volley activity with Pt sitting EOM to work on dynamic sitting balance, UB coordination, UB strength, reaction time, and midline orientation. Pt instructed to utilize 3# weighted dowel for increased proprioceptive feedback to hit beach ball back and forth with therapist. Pt able to complete 2.5 minutes x2 trials with close supervision and mod verbal cues for technique with rest breaks provided between and following trials.   Pt completed seated VM coordination activity seated EOM to work on hand strength, L UE coordination, and to improve maintained grasp. Pt instructed to utilize hand strengthener on level 2 setting with silver spring to pick up jenga blocks and transport them into cylinder cones. Pt with mod undershoot and mod dropping during activity with improved success when provided with verbal cues to slow pace and focus on task.   Pt completed functional mobility back to her room using RW with heavy min A provided for balance and RW management d/t L lean and mod verbal cues for midline orientation, to avoid obstacles, and for pacing. Pt returned to bed with CGA. Pt was left resting in bed with call bell in reach, bed alarm on, and all needs met.    Therapy Documentation Precautions:  Precautions Precautions: Fall Precaution Comments: 02, cortrak, indwelling cath, vision deficit, ataxia Restrictions Weight Bearing Restrictions: No   Therapy/Group: Individual Therapy  Clide Deutscher 11/02/2022, 10:23 AM

## 2022-11-03 DIAGNOSIS — K5901 Slow transit constipation: Secondary | ICD-10-CM

## 2022-11-03 DIAGNOSIS — I639 Cerebral infarction, unspecified: Secondary | ICD-10-CM | POA: Diagnosis not present

## 2022-11-03 MED ORDER — NYSTATIN 100000 UNIT/ML MT SUSP
5.0000 mL | Freq: Four times a day (QID) | OROMUCOSAL | Status: DC
  Administered 2022-11-03 – 2022-11-09 (×12): 500000 [IU] via ORAL
  Filled 2022-11-03 (×17): qty 5

## 2022-11-03 MED ORDER — MAGIC MOUTHWASH: 5.0000 mL | Freq: Three times a day (TID) | ORAL | Status: DC | PRN

## 2022-11-03 MED ORDER — POLYETHYLENE GLYCOL 3350 17 G PO PACK
17.0000 g | PACK | Freq: Every day | ORAL | Status: DC
  Administered 2022-11-03 – 2022-11-10 (×6): 17 g via ORAL
  Filled 2022-11-03 (×10): qty 1

## 2022-11-03 NOTE — Progress Notes (Signed)
PROGRESS NOTE   Subjective/Complaints:  Pt doing well, slept well, no pain, unsure of LBM but it's been "since she got here". Last documented BM was 10/28/22. States she has a hx of IBS and before when they gave her colace in the hospital she had diarrhea so she's very hesitant to do much of anything. Understands why it's important that she have a BM. Urinating fine. Denies any other complaints or concerns today.   ROS: +anxiety. See HPI. Denies CP, SOB, abd pain, +vertigo, decreased sensation to right hand, +decreased hearing from left ear, nausea improved   Objective:   No results found. No results for input(s): "WBC", "HGB", "HCT", "PLT" in the last 72 hours.  No results for input(s): "NA", "K", "CL", "CO2", "GLUCOSE", "BUN", "CREATININE", "CALCIUM" in the last 72 hours.   Intake/Output Summary (Last 24 hours) at 11/03/2022 1219 Last data filed at 11/03/2022 0900 Gross per 24 hour  Intake 520 ml  Output --  Net 520 ml         Physical Exam: Vital Signs Blood pressure (!) 90/58, pulse 70, temperature 98.8 F (37.1 C), temperature source Oral, resp. rate 18, height 5\' 6"  (1.676 m), weight 67.7 kg, SpO2 98%. Constitutional:      Comments: in bed resting, NAD HENT:     Head: Normocephalic and atraumatic.     Comments: L facial droop L eye will not close- lower eyelid droops, patched Cortrak in place- TF's off    Right Ear: External ear normal.     Left Ear: External ear normal.     Nose: Nose normal. No congestion.     Mouth/Throat:     Mouth: Mucous membranes are dry.     Pharynx: Oropharynx is clear. No oropharyngeal exudate.      Eyes:     Comments: Left lower lid everted and erythematous-- patched Cardiovascular:     Rate and Rhythm: Normal rate and regular rhythm.     Heart sounds: Normal heart sounds. No murmur heard.    No gallop.  Pulmonary: CTAB in all lung fields, no w/r/r, no hypoxia or increased  WOB, speaking in full sentences Abdominal:     General: Bowel sounds are normal.     Comments: Dressing over midline incision intact- just changed C/D/I +BS throughout although a little hypoactive, soft, nonTTP, nondistended.   PRIOR EXAMS: Neuro: L facial droop, decreased hearing left ear. Alert, some memory deficits, decreased sensation to right side of face and right hand, +vertebrobasilar insufficiency Musculoskeletal:     Cervical back: Neck supple. No tenderness.     Comments: 5/5 strength in upper extremities and 4/5 in lower extremities otherwise stable 10/18 Skin:    General: Skin is warm and dry.     Comments: IV L AC fossa and R forearm- look OK      Assessment/Plan: 1. Functional deficits which require 3+ hours per day of interdisciplinary therapy in a comprehensive inpatient rehab setting. Physiatrist is providing close team supervision and 24 hour management of active medical problems listed below. Physiatrist and rehab team continue to assess barriers to discharge/monitor patient progress toward functional and medical goals  Care Tool:  Bathing  Body parts bathed by patient: Right arm, Left arm, Chest, Abdomen, Front perineal area, Buttocks, Right upper leg, Left upper leg, Right lower leg, Face   Body parts bathed by helper: Right arm, Left lower leg, Right lower leg, Buttocks, Front perineal area, Right upper leg, Left upper leg     Bathing assist Assist Level: Minimal Assistance - Patient > 75%     Upper Body Dressing/Undressing Upper body dressing   What is the patient wearing?: Pull over shirt    Upper body assist Assist Level: Set up assist    Lower Body Dressing/Undressing Lower body dressing      What is the patient wearing?: Pants, Underwear/pull up     Lower body assist Assist for lower body dressing: Contact Guard/Touching assist     Toileting Toileting Toileting Activity did not occur (Clothing management and hygiene only): N/A (no  void or bm)  Toileting assist Assist for toileting: Minimal Assistance - Patient > 75%     Transfers Chair/bed transfer  Transfers assist  Chair/bed transfer activity did not occur: Safety/medical concerns  Chair/bed transfer assist level: Minimal Assistance - Patient > 75%     Locomotion Ambulation   Ambulation assist      Assist level: 2 helpers Assistive device: Walker-rolling Max distance: 141ft   Walk 10 feet activity   Assist     Assist level: 2 helpers Assistive device: Walker-rolling   Walk 50 feet activity   Assist Walk 50 feet with 2 turns activity did not occur: Safety/medical concerns (fatigue)  Assist level: 2 helpers Assistive device: Walker-rolling    Walk 150 feet activity   Assist Walk 150 feet activity did not occur: Safety/medical concerns         Walk 10 feet on uneven surface  activity   Assist Walk 10 feet on uneven surfaces activity did not occur: Safety/medical concerns         Wheelchair     Assist Is the patient using a wheelchair?: Yes Type of Wheelchair: Manual    Wheelchair assist level: Dependent - Patient 0% Max wheelchair distance: 33ft    Wheelchair 50 feet with 2 turns activity    Assist        Assist Level: Dependent - Patient 0%   Wheelchair 150 feet activity     Assist      Assist Level: Dependent - Patient 0%   Blood pressure (!) 90/58, pulse 70, temperature 98.8 F (37.1 C), temperature source Oral, resp. rate 18, height 5\' 6"  (1.676 m), weight 67.7 kg, SpO2 98%.  Medical Problem List and Plan: 1. Functional deficits secondary to L PICA/AICA stroke with cerebellar edema             -patient may  shower- if cover incisions             -ELOS/Goals: 10-12 days supervision             -Continue CIR  Head CT reviewed with patient 10/14 and improvements discussed   2.  Antithrombotics: -DVT/anticoagulation:  Pharmaceutical: Heparin 5000U q8h             -antiplatelet therapy:  Plavix 75mg  daily   3. Chronic back pain: kpad ordered. Tylenol as needed             -continue Norco 5/325 one tablet q HS             -continue Robaxin 500 mg QID   4. Anxiety: lexapro restarted daily at 10mg               -  continue trazodone 50 mg q HS-- timing changed to 8pm -continue melatonin 10 mg q HS-- d/c'd but unclear why, not sleeping well, reordered on 10/27/22             -antipsychotic agents: n/a   5. Neuropsych/cognition: This patient is? capable of making decisions on her (since sedated, cannot tell) own behalf.   6. Skin/Wound Care: Routine skin care checks             -continue packing of lower aspect of midline incision BID   7. 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 Urinary retention: placed order for foley removal 10/27/22  -10/27/22 foley ordered to be removed today, ordered PVRs to monitor -10/28/22 foley removed, one cath overnight for but otherwise low PVRs--monitor  18. Leukocytosis: WBC reviewed 10/14 and has resolved  19. Nausea: seems to be mostly with movement/transitions, has zofran ordered but will  trial meclizine to see if this helps with the movement aspect (pt doesn't state she's dizzy but how she describes her symptoms seems to have a rotational/movement component); started with 12.5mg  TID PRN, could always go up also.  -schedule zofran IV daily at 7am, discussed this helped, continue, messaged nursing regarding last BM since Zofran can be constipating.   20. Vertigo: d/c scopolamine patch, d/c meclizine since not helpful, discussed 2/2 vertebrobasilar insufficiency  21. Lethargy: much improved, diet upgraded to D2/honey, BUN reviewed and improved  22. Decreased appetite: improved, decrease TF to 2mls/hour to encourage oral appetite, much improved and now eating 20% of meals. Messaged nursing to educate patient that if she can eat 50% of lunch we can remove Cortrak, discussed with patient and this is her goal.   23. Decreased hearing left ear: discussed may be due to CN 8 being affected by stroke. Debrox ear drops ordered in case wax buildup is contributing  24. ADHD: Increase Vyvanse to 30mg  daily.   25. Constipation: d/c scheduled zofran since nausea has resolved -11/03/22 no BM since 10/28/22, pt very hesitant to taking stool softeners/laxatives due to prior diarrhea; discussed that constipation can lead to overflow diarrhea too so we really need to address the constipation; will start with miralax daily for now and see how things go.   LOS: 9 days A FACE TO FACE EVALUATION WAS PERFORMED  Margert Edsall 11/03/2022, 12:19 PM

## 2022-11-03 NOTE — Plan of Care (Signed)
  Problem: Education: Goal: Knowledge of disease or condition will improve Outcome: Progressing Goal: Knowledge of secondary prevention will improve (MUST DOCUMENT ALL) Outcome: Progressing Goal: Knowledge of patient specific risk factors will improve Joan Robinson N/A or DELETE if not current risk factor) Outcome: Progressing   Problem: RH BOWEL ELIMINATION Goal: RH STG MANAGE BOWEL WITH ASSISTANCE Description: STG Manage Bowel with mod I Assistance. Outcome: Progressing

## 2022-11-04 DIAGNOSIS — K136 Irritative hyperplasia of oral mucosa: Secondary | ICD-10-CM

## 2022-11-04 DIAGNOSIS — I639 Cerebral infarction, unspecified: Secondary | ICD-10-CM | POA: Diagnosis not present

## 2022-11-04 DIAGNOSIS — K5901 Slow transit constipation: Secondary | ICD-10-CM | POA: Diagnosis not present

## 2022-11-04 MED ORDER — POLYETHYLENE GLYCOL 3350 17 G PO PACK
17.0000 g | PACK | Freq: Once | ORAL | Status: AC
  Administered 2022-11-04: 17 g via ORAL
  Filled 2022-11-04: qty 1

## 2022-11-04 MED ORDER — MAGIC MOUTHWASH W/LIDOCAINE
15.0000 mL | Freq: Three times a day (TID) | ORAL | Status: DC | PRN
  Administered 2022-11-04: 15 mL via ORAL
  Filled 2022-11-04 (×2): qty 15

## 2022-11-04 MED ORDER — SENNOSIDES 8.8 MG/5ML PO SYRP
10.0000 mL | ORAL_SOLUTION | Freq: Every day | ORAL | Status: DC
  Administered 2022-11-04 – 2022-11-11 (×6): 10 mL via ORAL
  Filled 2022-11-04 (×9): qty 10

## 2022-11-04 NOTE — Plan of Care (Signed)
  Problem: RH SAFETY Goal: RH STG ADHERE TO SAFETY PRECAUTIONS W/ASSISTANCE/DEVICE Description: STG Adhere to Safety Precautions With cues  Assistance/Device. Outcome: Progressing   Problem: RH PAIN MANAGEMENT Goal: RH STG PAIN MANAGED AT OR BELOW PT'S PAIN GOAL Description: < 4 with prn Outcome: Progressing   Problem: RH KNOWLEDGE DEFICIT Goal: RH STG INCREASE KNOWLEDGE OF HYPERTENSION Description: Patient and spouse will be able to manage HTN using medications and dietary modifications using educational resources independently Outcome: Progressing Goal: RH STG INCREASE KNOWLEDGE OF DYSPHAGIA/FLUID INTAKE Description: Patient and spouse will be able to manage dysphagia, medications and dietary modifications using educational resources independently Outcome: Progressing Goal: RH STG INCREASE KNOWLEGDE OF HYPERLIPIDEMIA Description: Patient and spouse will be able to manage HLD using medications and dietary modifications using educational resources independently Outcome: Progressing Goal: RH STG INCREASE KNOWLEDGE OF STROKE PROPHYLAXIS Description: Patient and spouse will be able to manage secondary risks using medications and dietary modifications using educational resources independently Outcome: Progressing

## 2022-11-04 NOTE — Progress Notes (Addendum)
PROGRESS NOTE   Subjective/Complaints:  Pt doing well again today, slept ok, no pain currently, still no BM since 10/28/22. More agreeable with progressing bowel regimen today. Didn't tolerate sorbitol (due to abdominal cramping) but has tolerated miralax so far. Agreeable with trying double dose of miralax this morning, also senna to help stimulate bowels.  Urinating fine. Had some burning in mouth yesterday after honey thickener-- has noticed that for a couple days, no swelling just irritation. Nystatin ordered yesterday. Also ordered magic mouthwash with lidocaine to use today.  Denies any other complaints or concerns today.   ROS: +anxiety. See HPI. Denies CP, SOB, abd pain, +vertigo, decreased sensation to right hand, +decreased hearing from left ear, nausea improved   Objective:   No results found. No results for input(s): "WBC", "HGB", "HCT", "PLT" in the last 72 hours.  No results for input(s): "NA", "K", "CL", "CO2", "GLUCOSE", "BUN", "CREATININE", "CALCIUM" in the last 72 hours.   Intake/Output Summary (Last 24 hours) at 11/04/2022 1125 Last data filed at 11/04/2022 0900 Gross per 24 hour  Intake 430 ml  Output --  Net 430 ml         Physical Exam: Vital Signs Blood pressure 104/70, pulse 72, temperature 97.6 F (36.4 C), resp. rate 17, height 5\' 6"  (1.676 m), weight 67.7 kg, SpO2 100%. Constitutional:      Comments: in bed resting, NAD HENT:     Head: Normocephalic and atraumatic.     Comments: L facial droop L eye will not close- lower eyelid droops, patched Cortrak in place- TF's off, meds being given by tube    Right Ear: External ear normal.     Left Ear: External ear normal.     Nose: Nose normal. No congestion.     Mouth/Throat:     Mouth: Mucous membranes are dry.     Pharynx: Oropharynx is clear. No oropharyngeal exudate.      Eyes:     Comments: Left lower lid everted and erythematous--  patched Cardiovascular:     Rate and Rhythm: Normal rate and regular rhythm.     Heart sounds: Normal heart sounds. No murmur heard.    No gallop.  Pulmonary: CTAB in all lung fields, no w/r/r, no hypoxia or increased WOB, speaking in full sentences Abdominal:     General: Bowel sounds are normal.     Comments: Dressing over midline incision intact- just changed C/D/I +BS throughout although definitely hypoactive, soft, nonTTP, nondistended.  Psych: pleasant, agreeable and cooperative, less anxious than prior weekends  PRIOR EXAMS: Neuro: L facial droop, decreased hearing left ear. Alert, some memory deficits, decreased sensation to right side of face and right hand, +vertebrobasilar insufficiency Musculoskeletal:     Cervical back: Neck supple. No tenderness.     Comments: 5/5 strength in upper extremities and 4/5 in lower extremities otherwise stable 10/18 Skin:    General: Skin is warm and dry.     Comments: IV L AC fossa and R forearm- look OK      Assessment/Plan: 1. Functional deficits which require 3+ hours per day of interdisciplinary therapy in a comprehensive inpatient rehab setting. Physiatrist is providing close team supervision  and 24 hour management of active medical problems listed below. Physiatrist and rehab team continue to assess barriers to discharge/monitor patient progress toward functional and medical goals  Care Tool:  Bathing    Body parts bathed by patient: Right arm, Left arm, Chest, Abdomen, Front perineal area, Buttocks, Right upper leg, Left upper leg, Right lower leg, Face   Body parts bathed by helper: Right arm, Left lower leg, Right lower leg, Buttocks, Front perineal area, Right upper leg, Left upper leg     Bathing assist Assist Level: Minimal Assistance - Patient > 75%     Upper Body Dressing/Undressing Upper body dressing   What is the patient wearing?: Pull over shirt    Upper body assist Assist Level: Set up assist    Lower Body  Dressing/Undressing Lower body dressing      What is the patient wearing?: Pants, Underwear/pull up     Lower body assist Assist for lower body dressing: Contact Guard/Touching assist     Toileting Toileting Toileting Activity did not occur (Clothing management and hygiene only): N/A (no void or bm)  Toileting assist Assist for toileting: Minimal Assistance - Patient > 75%     Transfers Chair/bed transfer  Transfers assist  Chair/bed transfer activity did not occur: Safety/medical concerns  Chair/bed transfer assist level: Minimal Assistance - Patient > 75%     Locomotion Ambulation   Ambulation assist      Assist level: 2 helpers Assistive device: Walker-rolling Max distance: 157ft   Walk 10 feet activity   Assist     Assist level: 2 helpers Assistive device: Walker-rolling   Walk 50 feet activity   Assist Walk 50 feet with 2 turns activity did not occur: Safety/medical concerns (fatigue)  Assist level: 2 helpers Assistive device: Walker-rolling    Walk 150 feet activity   Assist Walk 150 feet activity did not occur: Safety/medical concerns         Walk 10 feet on uneven surface  activity   Assist Walk 10 feet on uneven surfaces activity did not occur: Safety/medical concerns         Wheelchair     Assist Is the patient using a wheelchair?: Yes Type of Wheelchair: Manual    Wheelchair assist level: Dependent - Patient 0% Max wheelchair distance: 110ft    Wheelchair 50 feet with 2 turns activity    Assist        Assist Level: Dependent - Patient 0%   Wheelchair 150 feet activity     Assist      Assist Level: Dependent - Patient 0%   Blood pressure 104/70, pulse 72, temperature 97.6 F (36.4 C), resp. rate 17, height 5\' 6"  (1.676 m), weight 67.7 kg, SpO2 100%.  Medical Problem List and Plan: 1. Functional deficits secondary to L PICA/AICA stroke with cerebellar edema             -patient may  shower- if  cover incisions             -ELOS/Goals: 10-12 days supervision             -Continue CIR  Head CT reviewed with patient 10/14 and improvements discussed   2.  Antithrombotics: -DVT/anticoagulation:  Pharmaceutical: Heparin 5000U q8h             -antiplatelet therapy: Plavix 75mg  daily   3. Chronic back pain: kpad ordered. Tylenol as needed             -continue Norco 5/325  one tablet q HS             -continue Robaxin 500 mg QID   4. Anxiety: lexapro restarted daily at 10mg               -continue trazodone 50 mg q HS-- timing changed to 8pm -continue melatonin 10 mg q HS-- d/c'd but unclear why, not sleeping well, reordered on 10/27/22             -antipsychotic agents: n/a   5. Neuropsych/cognition: This patient is? capable of making decisions on her (since sedated, cannot tell) own behalf.   6. Skin/Wound Care: Routine skin care checks             -continue packing of lower aspect of midline incision BID   7. 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 Urinary retention: placed order for foley removal 10/27/22-- +UCx on 10/14, completed 3  days of Bactrim given  -10/27/22 foley ordered to be removed today, ordered PVRs to monitor -10/28/22 foley removed, one cath overnight for but otherwise low PVRs--monitor -11/04/22 doing well, resolved.  18. Leukocytosis: WBC reviewed 10/14 and has resolved  19. Nausea: seems to be mostly with movement/transitions, has zofran ordered but will trial meclizine to see if this helps with the movement aspect (pt doesn't state she's dizzy but how she describes her symptoms seems to have a rotational/movement component); started with 12.5mg  TID PRN, could always go up also.  -schedule zofran IV daily at 7am, discussed this helped, continue, messaged nursing regarding last BM since Zofran can be constipating.   20. Vertigo: d/c scopolamine patch, d/c meclizine since not helpful, discussed 2/2 vertebrobasilar insufficiency  21. Lethargy: much improved, diet upgraded to D2/honey, BUN reviewed and improved  22. Decreased appetite: improved, decrease TF to 43mls/hour to encourage oral appetite, much improved and now eating 20% of meals. Messaged nursing to educate patient that if she can eat 50% of lunch we can remove Cortrak, discussed with patient and this is her goal.   23. Decreased hearing left ear: discussed may be due to CN 8 being affected by stroke. Debrox ear drops ordered in case wax buildup is contributing  24. ADHD: Increase Vyvanse to 30mg  daily.   25. Constipation: d/c scheduled zofran since nausea has resolved -11/03/22  no BM since 10/28/22, pt very hesitant to taking stool softeners/laxatives due to prior diarrhea; discussed that constipation can lead to overflow diarrhea too so we really need to address the constipation; will start with miralax daily for now and see how things go.  -11/04/22 still no BM, pt now agreeable to be more aggressive with bowel regimen; didn't tolerate sorbitol (cramping) and CrCl too low for Mg Citrate, will try double dose of miralax this morning and  ordered 17.2mg  senna daily-- hopefully this helps. If pt starts feeling more lower abd/back pressure, would consider enema to help, but I think it won't be as helpful yet.   26. Oral irritation/burning: no definite thrush but nystatin started yesterday by Dr. Benjie Karvonen-- continue this, ordered magic mouthwash with lidocaine to help. Could be related to thickener but feel this is less likely given that she's been using it for a while. No swelling or s/sx anaphylaxis. Monitor.   I spent >11mins performing patient care related activities, including face to face time, documentation time, med management, discussion of meds/bowel regimen with patient and nursing staff, and overall coordination of care.   LOS: 10 days A FACE TO FACE EVALUATION WAS PERFORMED  [address] 11/04/2022, 11:25 AM

## 2022-11-05 ENCOUNTER — Inpatient Hospital Stay (HOSPITAL_COMMUNITY): Payer: BC Managed Care – PPO

## 2022-11-05 DIAGNOSIS — I639 Cerebral infarction, unspecified: Secondary | ICD-10-CM | POA: Diagnosis not present

## 2022-11-05 DIAGNOSIS — H8190 Unspecified disorder of vestibular function, unspecified ear: Secondary | ICD-10-CM | POA: Insufficient documentation

## 2022-11-05 DIAGNOSIS — R1310 Dysphagia, unspecified: Secondary | ICD-10-CM

## 2022-11-05 LAB — CBC
HCT: 31.7 % — ABNORMAL LOW (ref 36.0–46.0)
Hemoglobin: 10.6 g/dL — ABNORMAL LOW (ref 12.0–15.0)
MCH: 30.4 pg (ref 26.0–34.0)
MCHC: 33.4 g/dL (ref 30.0–36.0)
MCV: 90.8 fL (ref 80.0–100.0)
Platelets: 248 10*3/uL (ref 150–400)
RBC: 3.49 MIL/uL — ABNORMAL LOW (ref 3.87–5.11)
RDW: 15.4 % (ref 11.5–15.5)
WBC: 4 10*3/uL (ref 4.0–10.5)
nRBC: 0 % (ref 0.0–0.2)

## 2022-11-05 LAB — BASIC METABOLIC PANEL
Anion gap: 12 (ref 5–15)
BUN: 16 mg/dL (ref 8–23)
CO2: 25 mmol/L (ref 22–32)
Calcium: 9.9 mg/dL (ref 8.9–10.3)
Chloride: 100 mmol/L (ref 98–111)
Creatinine, Ser: 0.85 mg/dL (ref 0.44–1.00)
GFR, Estimated: >60 mL/min (ref >60)
Glucose, Bld: 101 mg/dL — ABNORMAL HIGH (ref 70–99)
Potassium: 4.2 mmol/L (ref 3.5–5.1)
Sodium: 137 mmol/L (ref 135–145)

## 2022-11-05 MED ORDER — MAGNESIUM GLUCONATE 500 MG PO TABS
250.0000 mg | ORAL_TABLET | Freq: Every day | ORAL | Status: DC
  Administered 2022-11-05 – 2022-11-12 (×8): 250 mg via ORAL
  Filled 2022-11-05 (×8): qty 1

## 2022-11-05 MED ORDER — GERHARDT'S BUTT CREAM
TOPICAL_CREAM | Freq: Two times a day (BID) | CUTANEOUS | Status: DC
  Filled 2022-11-05: qty 1

## 2022-11-05 NOTE — Progress Notes (Signed)
Occupational Therapy Weekly Progress Note  Patient Details  Name: Joan Robinson MRN: [id number] Date of Birth: 1960-10-13  Beginning of progress report period: October 26, 2022 End of progress report period: November 05, 2022  Today's Date: 11/05/2022 OT Individual Time: 1100-1200 OT Individual Time Calculation (min): 60 min    Patient has met 4 of 4 short term goals.  Significant improvement noted with balance, tolerance of movement, and endurance.    Patient continues to demonstrate the following deficits: abnormal tone, unbalanced muscle activation, motor apraxia, ataxia, decreased coordination, and decreased motor planning, decreased visual acuity, decreased attention to left and decreased motor planning, decreased memory and delayed processing, and decreased sitting balance, decreased standing balance, decreased postural control, hemiplegia, and decreased balance strategies and therefore will continue to benefit from skilled OT intervention to enhance overall performance with BADL and Reduce care partner burden.  Patient progressing toward long term goals..  Continue plan of care.  OT Short Term Goals Week 1:  OT Short Term Goal 1 (Week 1): Patient will maintain arousal x 45 min during BADL activity OT Short Term Goal 1 - Progress (Week 1): Met OT Short Term Goal 2 (Week 1): Patient will complete upper boody bathing with min assist OT Short Term Goal 2 - Progress (Week 1): Met OT Short Term Goal 3 (Week 1): Patient will complete lower body bathing with mod assist OT Short Term Goal 3 - Progress (Week 1): Met OT Short Term Goal 4 (Week 1): Patient will don/doff pull over shirt with min assist OT Short Term Goal 4 - Progress (Week 1): Met OT Short Term Goal 5 (Week 1): Patient will don pants with mod assist OT Short Term Goal 5 - Progress (Week 1): Met Week 2:  OT Short Term Goal 1 (Week 2): STG=LTG due to LOS  Skilled Therapeutic Interventions/Progress Updates:    Patient  received supine in bed - eager for shower.  Checked with MD regarding wound care during shower.  Patient's daughter present for this session.  Patient transferred to wheelchair with contact guard assistance - needing cueing to manage LUE.  Patient transferred to shower bench with contact guard assistance and cueing to manage placement of feet.  Patient's wound uncovered and unpacked.  Patient able to shower with min assist.  Patient thrilled to shower - "I feel like a human again!"   Patient sat at sink and nursing called into repack and redress wound.  Patient able to don clothing with intermittent assistance and set up.  Sit to stand transitions improving, and at times able to free both hands from support to manage ADL task. Returned to bed for 1 hour break before next session.  Daughter at bedside.    Therapy Documentation Precautions:  Precautions Precautions: Fall Precaution Comments: 02, cortrak, indwelling cath, vision deficit, ataxia Restrictions Weight Bearing Restrictions: No  Pain: Pain Assessment Pain Scale: 0-10 Pain Score: 0-No pain Faces Pain Scale: No hurt    Therapy/Group: Individual Therapy  [name] 11/05/2022, 1:31 PM

## 2022-11-05 NOTE — Progress Notes (Signed)
Physical Therapy Session Note  Patient Details  Name: Joan Robinson MRN: [id number] Date of Birth: November 01, 1960  Today's Date: 11/05/2022 PT Individual Time: 2130-8657  + 1300-1355 PT Individual Time Calculation (min): 45 min  + 55 min  Short Term Goals: Week 2:  PT Short Term Goal 1 (Week 2): Pt will complete bed<>chair transfers with CGA and LRAD PT Short Term Goal 2 (Week 2): Pt will ambulate 161ft with minA and LRAD PT Short Term Goal 3 (Week 2): Pt will initiate stair training  Skilled Therapeutic Interventions/Progress Updates:      1st session: Pt in bed with LPN giving morning medications through her cortrak. Pt inquiring on a lightweight wheelchair to give her more functional independence in the home. Retrieved a Cat 4 [employer] [id number] wheelchair and educated patient on general usage. Assisted patient to EOB with CGA and then stand pivot transfer with CGA and no AD into w/c. Transported to rehab gym and worked the remaining time on wheelchair propulsion. She propelled herself ~129ft + ~172ft with minA for straight path and managing the turns. Pt having difficulty with coordination on her L hand - wrapped the rim with Coban tape to improve grip and awareness, which did help.   Pt returned to her room and requesting to toilet. Educated daughter, Judeth Cornfield, on toilet transfers (stand pivots) from w/c level. Caregiver signed off on safety plan. All needs met at end of treatment.     2nd session: Pt lying in bed to start - agreeable and motivated to work in therapy. Supine<>sitting EOB with supervision. Stand pivot transfer into w/c with CGA and no AD.   Pt transported to day room rehab gym. Instructed in gait training where she ambulated 2x130ft (seated rest) with minA to start, fading to modA for the last ~7ft. Increased assist needed due to fatigue and worsening L trunk lean. Gait ataxic with narrow BOS and LLE scissoring. Worked on static standing balance on firm surface while  unsupported. minA to start with intermittent ability to maintain with CGA - she continues to favor her L side and posterior lean, high fear of falling. Upgraded task for semi-tandem standing but unable to maintain without assist or support.   Pt c/o feeling "lightheaded" and "very weak" with standing and mobility training. BP checked for orthostasis, see below:  107/66 93/61 80/43  *MD and primary team notified via secure chat. MD suggesting compression socks. Will use next session.  Returned to her room and assisted back to bed. Left in bed with all needs met, made comfortable. LPN in room to check on patient as well.   Therapy107 Documentation Precautions:  Precautions Precautions: Fall Precaution Comments: 02, cortrak, indwelling cath, vision deficit, ataxia Restrictions Weight Bearing Restrictions: No General:     Therapy/Group: Individual Therapy  Orrin Brigham 11/05/2022, 7:51 AM

## 2022-11-05 NOTE — Progress Notes (Signed)
Notified pam of cortrak being clogged. Tried different methods to unclog with no luck.

## 2022-11-05 NOTE — Procedures (Signed)
Modified Barium Swallow Study  Patient Details  Name: Joan Robinson MRN: [id number] Date of Birth: [birth year]  Today's Date: 11/05/2022  Modified Barium Swallow completed.  Full report located under Chart Review in the Imaging Section.  History of Present Illness 62 yo admitted 9/28 with RUQ pain with SBO.  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 PMhx: HTN, HLD, peptic ulcer disease, diverticulitis, bipolar disorder, hypothyroidism, GERD, Hashimoto's disease. Hemicolectomy completed on 10/1. Patient completed MBS 10/22/22 revealing oropharyngeal dysphagia with initiation of Dys1/HTL diet. Silent aspiraiton observed with NTL and thin liquids.  Clinical Impression Pt presents with moderate oral and mild pharyngeal dysphagia. Oral phase characterized by reduced labial seal (resulting in anterior bolus escape beyond the mid chin with thin liquids from teaspoon), impaired/slowed mastication, slowed AP transit, and mild-moderate oral residue after the initial swallow. Patient sensate to residue and works to clear independently. Pharyngeally, deficits characterized by reduced pharyngeal constriction and reduced BOT retraction to the posterior pharyngeal wall resulting in trace-mild residue in the vallecula with all trialed consistencies. Both orally and pharyngeally, amount of residue increases alongside bolus viscosity. Patient continues to present with trismus that makes oral acceptance of larger bites of food difficult, though this is improving. No significant penetration/aspiration observed throughout all trials. Straw bolus acceptance minimized anterior bolus loss and adding an additional dry swallow after solids mitigated residue in the vallecula. Recommend diet upgrade to Dys2/thin liquid diet, adding additional swallow intermittently to clear residue. Family updated to results and in agreement with plan.  Factors that may  increase risk of adverse event in presence of aspiration [name] & [name] 2021): Weak cough;Presence of tubes (ETT, trach, NG, etc.)  Swallow Evaluation Recommendations Recommendations: PO diet PO Diet Recommendation: Dysphagia 2 (Finely chopped);Thin liquids (Level 0) Liquid Administration via: Cup;Spoon;Straw Medication Administration: Crushed with puree Supervision: Full supervision/cueing for swallowing strategies;Patient able to self-feed Swallowing strategies  : Slow rate;Check for pocketing or oral holding;Check for anterior loss;Multiple dry swallows after each bite/sip Postural changes: Position pt fully upright for meals;Stay upright 30-60 min after meals Oral care recommendations: Oral care BID (2x/day)  Jeannie Done, M.A., CCC-SLP  Morrie Sheldon A Kris No 11/05/2022,9:55 AM

## 2022-11-05 NOTE — Progress Notes (Signed)
PROGRESS NOTE   Subjective/Complaints: Continues to feel well Did great on MBS today and upgraded to D2 thins! Discussed soups/smoothies for good nutrition  ROS: +anxiety- improved. See HPI. Denies CP, SOB, abd pain, +vertigo, decreased sensation to right hand, +decreased hearing from left ear, nausea improved   Objective:   DG Swallowing Func-Speech Pathology  Result Date: 11/05/2022 Table formatting from the original result was not included. Modified Barium Swallow Study Patient Details Name: Joan Robinson MRN: [id number] Date of Birth: Mar 29, 1960 Today's Date: 11/05/2022 HPI/PMH: HPI: 62 yo admitted 9/28 with RUQ pain with SBO.  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 PMhx: HTN, HLD, peptic ulcer disease, diverticulitis, bipolar disorder, hypothyroidism, GERD, Hashimoto's disease. Hemicolectomy completed on 10/1. Patient completed MBS 10/22/22 revealing oropharyngeal dysphagia with initiation of Dys1/HTL diet. Silent aspiraiton observed with NTL and thin liquids. Clinical Impression: Clinical Impression: Pt presents with moderate oral and mild pharyngeal dysphagia. Oral phase characterized by reduced labial seal (resulting in anterior bolus escape beyond the mid chin with thin liquids from teaspoon), impaired/slowed mastication, slowed AP transit, and mild-moderate oral residue after the initial swallow. Patient sensate to residue and works to clear independently. Pharyngeally, deficits characterized by reduced pharyngeal constriction and reduced BOT retraction to the posterior pharyngeal wall resulting in trace-mild residue in the vallecula with all trialed consistencies. Both orally and pharyngeally, amount of residue increases alongside bolus viscosity. Patient continues to present with trismus that makes oral acceptance of larger bites of food difficult, though  this is improving. No significant penetration/aspiration observed throughout all trials. Straw bolus acceptance minimized anterior bolus loss and adding an additional dry swallow after solids mitigated residue in the vallecula. Recommend diet upgrade to Dys2/thin liquid diet, adding additional swallow intermittently to clear residue. Family updated to results and in agreement with plan. Factors that may increase risk of adverse event in presence of aspiration [name] & [name] 2021): Factors that may increase risk of adverse event in presence of aspiration [name] & [name] 2021): Weak cough; Presence of tubes (ETT, trach, NG, etc.) Recommendations/Plan: Swallowing Evaluation Recommendations Swallowing Evaluation Recommendations Recommendations: PO diet PO Diet Recommendation: Dysphagia 2 (Finely chopped); Thin liquids (Level 0) Liquid Administration via: Cup; Spoon; Straw Medication Administration: Crushed with puree Supervision: Full supervision/cueing for swallowing strategies; Patient able to self-feed Swallowing strategies  : Slow rate; Check for pocketing or oral holding; Check for anterior loss; Multiple dry swallows after each bite/sip Postural changes: Position pt fully upright for meals; Stay upright 30-60 min after meals Oral care recommendations: Oral care BID (2x/day) Treatment Plan Treatment Plan Treatment recommendations: Therapy as outlined in treatment plan below Follow-up recommendations: Outpatient SLP Functional status assessment: Patient has had a recent decline in their functional status and demonstrates the ability to make significant improvements in function in a reasonable and predictable amount of time. Treatment frequency: Min 2x/week Treatment duration: 2 weeks Interventions: Patient/family education; Trials of upgraded texture/liquids; Diet toleration management by SLP; Oropharyngeal exercises; Compensatory techniques Recommendations Recommendations for follow up therapy are one  component of a multi-disciplinary discharge planning process, led by the attending physician.  Recommendations may be updated based on  patient status, additional functional criteria and insurance authorization. Assessment: Orofacial Exam: Orofacial Exam Oral Cavity: Oral Hygiene: WFL Oral Cavity - Dentition: Poor condition; Missing dentition Orofacial Anatomy: WFL Oral Motor/Sensory Function: Suspected cranial nerve impairment CN V - Trigeminal: Left motor impairment CN VII - Facial: Left motor impairment CN IX - Glossopharyngeal, CN X - Vagus: Left motor impairment CN XII - Hypoglossal: Left motor impairment Anatomy: Anatomy: WFL Boluses Administered: Boluses Administered Boluses Administered: Thin liquids (Level 0); Mildly thick liquids (Level 2, nectar thick); Moderately thick liquids (Level 3, honey thick); Puree; Solid  Oral Impairment Domain: Oral Impairment Domain Lip Closure: Escape beyond mid-chin Tongue control during bolus hold: Posterior escape of less than half of bolus Bolus preparation/mastication: Disorganized chewing/mashing with solid pieces of bolus unchewed Bolus transport/lingual motion: Slow tongue motion; Repetitive/disorganized tongue motion Oral residue: Residue collection on oral structures Location of oral residue : Lateral sulci; Tongue Initiation of pharyngeal swallow : Posterior laryngeal surface of the epiglottis  Pharyngeal Impairment Domain: Pharyngeal Impairment Domain Soft palate elevation: No bolus between soft palate (SP)/pharyngeal wall (PW) Laryngeal elevation: Complete superior movement of thyroid cartilage with complete approximation of arytenoids to epiglottic petiole Anterior hyoid excursion: Complete anterior movement Epiglottic movement: Complete inversion Laryngeal vestibule closure: Complete, no air/contrast in laryngeal vestibule Pharyngeal stripping wave : Present - diminished Pharyngeal contraction (A/P view only): N/A Pharyngoesophageal segment opening: Complete  distension and complete duration, no obstruction of flow Tongue base retraction: Trace column of contrast or air between tongue base and PPW Pharyngeal residue: Trace residue within or on pharyngeal structures Location of pharyngeal residue: Valleculae  Esophageal Impairment Domain: Esophageal Impairment Domain Esophageal clearance upright position: -- (not observed) Pill: Pill Consistency administered: -- (not tested) Penetration/Aspiration Scale Score: Penetration/Aspiration Scale Score 1.  Material does not enter airway: Mildly thick liquids (Level 2, nectar thick); Moderately thick liquids (Level 3, honey thick); Puree; Solid 2.  Material enters airway, remains ABOVE vocal cords then ejected out: Thin liquids (Level 0) Compensatory Strategies: Compensatory Strategies Compensatory strategies: Yes Straw: Effective Effective Straw: Thin liquid (Level 0) Multiple swallows: Effective Effective Multiple Swallows: Solid; Puree; Thin liquid (Level 0)   General Information: Caregiver present: No  Diet Prior to this Study: Moderately thick liquids (Level 3, honey thick); Dysphagia 2 (finely chopped)   Temperature : Normal   Respiratory Status: WFL   Supplemental O2: None (Room air)   History of Recent Intubation: Yes  Behavior/Cognition: Alert; Cooperative; Pleasant mood Self-Feeding Abilities: Needs set-up for self-feeding Baseline vocal quality/speech: Normal Volitional Cough: Able to elicit Volitional Swallow: Able to elicit Exam Limitations: No limitations Goal Planning: Prognosis for improved oropharyngeal function: Good No data recorded No data recorded Patient/Family Stated Goal: upgraded diet, improved oral strength Consulted and agree with results and recommendations: Patient; Family member/caregiver Pain: Pain Assessment Pain Assessment: No/denies pain End of Session: Start Time:No data recorded Stop Time: No data recorded Time Calculation:No data recorded Charges: No data recorded SLP visit diagnosis: SLP Visit  Diagnosis: Dysphagia, oropharyngeal phase (R13.12) Past Medical History: Past Medical History: Diagnosis Date  Allergic rhinitis   Anemia 10/10/2011  Anxiety and depression 01/17/2007  Qualifier: Diagnosis of  By: Everardo All MD, Sean A   Arthritis 07/24/2013  Likely inflammatory and following with Dr Maryln Gottron of [facility]  rheumatology  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  BCC (basal cell carcinoma of skin) 06/01/2012  Leg Follows with Dr Margo Aye  Bipolar disorder [facility]) 01/17/2007  Qualifier: Diagnosis of  By: Everardo All MD, Gregary Signs A   Cataract   Diverticulosis  Diverticulosis   Emphysema of lung (HCC)   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  Gallstones   GERD (gastroesophageal reflux disease)   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  Hashimoto's disease   Hyperlipidemia   Hypertension   Hypothyroidism 08/24/2006  Qualifier: Diagnosis of  By: Everardo All MD, Sean A    IBS (irritable bowel syndrome) 07/27/2016  Obesity 11/01/2013  PUD (peptic ulcer disease)   Sleep apnea 04/27/2016  Tobacco abuse  Past Surgical History: Past Surgical History: Procedure Laterality Date  ABDOMINAL HYSTERECTOMY    ABDOMINAL HYSTERECTOMY  01/15/2009  complete  COLONOSCOPY    DILATION AND CURETTAGE OF UTERUS  01/16/1983  EYE SURGERY  03/03/2014  Surgery on both eyes for epiretinal membrane (vitreous peel)  Gated Spect wall motion stress cardiolite  11/05/2001  HIATAL HERNIA REPAIR N/A 07/12/2021  Procedure: LAPAROSCOPY W/ EXTENSIVE FOREGUT DISSECTION; PARTIAL STOMACH REDUCTION; GASTROSTOMY TUBE PLACEMENT; GASTROPEXY;  Surgeon: Luretha Murphy, MD;  Location: [facility];  Service: General;  Laterality: N/A;  LAPAROSCOPIC RIGHT HEMI COLECTOMY Right 10/16/2022  Procedure: LAPAROSCOPIC ASSISTED RIGHT HEMI COLECTOMY;  Surgeon: Andria Meuse, MD;  Location: [facility] OR;  Service: General;  Laterality: Right;  POLYPECTOMY    TUBAL LIGATION  01/15/1993  UTERINE SUSPENSION    mesh  VITRECTOMY Bilateral 03/03/2014  XI ROBOTIC ASSISTED HIATAL HERNIA REPAIR N/A 05/01/2021  Procedure: XI  ROBOTIC ASSISTED TYPE III HIATAL HERNIA REPAIR WITH FUNDOPLICATION;  Surgeon: Luretha Murphy, MD;  Location: [facility];  Service: General;  Laterality: N/AYetta Barre 11/05/2022, 10:06 AM  Recent Labs    11/05/22 0606  WBC 4.0  HGB 10.6*  HCT 31.7*  PLT 248    Recent Labs    11/05/22 0606  NA 137  K 4.2  CL 100  CO2 25  GLUCOSE 101*  BUN 16  CREATININE 0.85  CALCIUM 9.9     Intake/Output Summary (Last 24 hours) at 11/05/2022 1055 Last data filed at 11/05/2022 0700 Gross per 24 hour  Intake 236 ml  Output --  Net 236 ml         Physical Exam: Vital Signs Blood pressure 95/61, pulse 67, temperature 97.8 F (36.6 C), temperature source Oral, resp. rate 16, height 5\' 6"  (1.676 m), weight 67.7 kg, SpO2 96%. Constitutional:      Comments: in bed resting, NAD HENT:     Head: Normocephalic and atraumatic.     Comments: L facial droop L eye will not close- lower eyelid droops, patched Cortrak in place- TF's off, meds being given by tube    Right Ear: External ear normal.     Left Ear: External ear normal.     Nose: Nose normal. No congestion.     Mouth/Throat:     Mouth: Mucous membranes are dry.     Pharynx: Oropharynx is clear. No oropharyngeal exudate.      Eyes:     Comments: Left lower lid everted and erythematous-- patched Cardiovascular:     Rate and Rhythm: Normal rate and regular rhythm.     Heart sounds: Normal heart sounds. No murmur heard.    No gallop.  Pulmonary: CTAB in all lung fields, no w/r/r, no hypoxia or increased WOB, speaking in full sentences Abdominal:     General: Bowel sounds are normal.     Comments: Dressing over midline incision intact- just changed C/D/I +BS throughout although definitely hypoactive, soft, nonTTP, nondistended.  Psych: pleasant, agreeable and cooperative, less anxious than prior weekends  PRIOR EXAMS: Neuro: L facial droop, decreased hearing  left ear. Alert, some memory deficits, decreased sensation to right  side of face and right hand, +vertebrobasilar insufficiency Musculoskeletal:     Cervical back: Neck supple. No tenderness.     Comments: 5/5 strength in upper extremities and 4/5 in lower extremities otherwise stable 10/21 Skin:    General: Skin is warm and dry.     Comments: IV L AC fossa and R forearm- look OK      Assessment/Plan: 1. Functional deficits which require 3+ hours per day of interdisciplinary therapy in a comprehensive inpatient rehab setting. Physiatrist is providing close team supervision and 24 hour management of active medical problems listed below. Physiatrist and rehab team continue to assess barriers to discharge/monitor patient progress toward functional and medical goals  Care Tool:  Bathing    Body parts bathed by patient: Right arm, Left arm, Chest, Abdomen, Front perineal area, Buttocks, Right upper leg, Left upper leg, Right lower leg, Face   Body parts bathed by helper: Right arm, Left lower leg, Right lower leg, Buttocks, Front perineal area, Right upper leg, Left upper leg     Bathing assist Assist Level: Minimal Assistance - Patient > 75%     Upper Body Dressing/Undressing Upper body dressing   What is the patient wearing?: Pull over shirt    Upper body assist Assist Level: Set up assist    Lower Body Dressing/Undressing Lower body dressing      What is the patient wearing?: Pants, Underwear/pull up     Lower body assist Assist for lower body dressing: Contact Guard/Touching assist     Toileting Toileting Toileting Activity did not occur (Clothing management and hygiene only): N/A (no void or bm)  Toileting assist Assist for toileting: Minimal Assistance - Patient > 75%     Transfers Chair/bed transfer  Transfers assist  Chair/bed transfer activity did not occur: Safety/medical concerns  Chair/bed transfer assist level: Minimal Assistance - Patient > 75%     Locomotion Ambulation   Ambulation assist      Assist level:  2 helpers Assistive device: Walker-rolling Max distance: 167ft   Walk 10 feet activity   Assist     Assist level: 2 helpers Assistive device: Walker-rolling   Walk 50 feet activity   Assist Walk 50 feet with 2 turns activity did not occur: Safety/medical concerns (fatigue)  Assist level: 2 helpers Assistive device: Walker-rolling    Walk 150 feet activity   Assist Walk 150 feet activity did not occur: Safety/medical concerns         Walk 10 feet on uneven surface  activity   Assist Walk 10 feet on uneven surfaces activity did not occur: Safety/medical concerns         Wheelchair     Assist Is the patient using a wheelchair?: Yes Type of Wheelchair: Manual    Wheelchair assist level: Dependent - Patient 0% Max wheelchair distance: 21ft    Wheelchair 50 feet with 2 turns activity    Assist        Assist Level: Dependent - Patient 0%   Wheelchair 150 feet activity     Assist      Assist Level: Dependent - Patient 0%   Blood pressure 95/61, pulse 67, temperature 97.8 F (36.6 C), temperature source Oral, resp. rate 16, height 5\' 6"  (1.676 m), weight 67.7 kg, SpO2 96%.  Medical Problem List and Plan: 1. Functional deficits secondary to L PICA/AICA stroke with cerebellar edema             -  patient may  shower- if cover incisions             -ELOS/Goals: 10-12 days supervision             -Continue CIR  Head CT reviewed with patient 10/14 and improvements discussed   Disability paperwork given to [facility] for completion 2.  Antithrombotics: -DVT/anticoagulation:  Pharmaceutical: Heparin 5000U q8h             -antiplatelet therapy: Plavix 75mg  daily   3. Chronic back pain: kpad ordered. Tylenol as needed             -continue Norco 5/325 one tablet q HS             Robaxin d/ced due to hypotension   4. Anxiety: lexapro restarted daily at 10mg               -continue trazodone 50 mg q HS-- timing changed to 8pm -continue melatonin 10  mg q HS-- d/c'd but unclear why, not sleeping well, reordered on 10/27/22             -antipsychotic agents: n/a   5. Neuropsych/cognition: This patient is? capable of making decisions on her (since sedated, cannot tell) own behalf.   6. Skin/Wound Care: Routine skin care checks             -continue packing of lower aspect of midline incision BID   7. 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 Urinary retention: placed order for foley removal 10/27/22-- +UCx on 10/14, completed 3 days of Bactrim given  -10/27/22 foley ordered to be removed today, ordered PVRs to monitor -10/28/22 foley removed, one cath overnight for but otherwise low PVRs--monitor -11/04/22 doing well, resolved.  18. Leukocytosis: WBC reviewed 10/14 and has resolved  19. Nausea: seems to be mostly with movement/transitions, has zofran ordered but will trial meclizine to see if this helps with the movement aspect (pt doesn't state she's dizzy but how she describes her symptoms seems to have a rotational/movement component); started with 12.5mg  TID PRN, could always go up  also.  -schedule zofran IV daily at 7am, discussed this helped, continue, messaged nursing regarding last BM since Zofran can be constipating.   20. Vertigo: d/c scopolamine patch, d/c meclizine since not helpful, discussed 2/2  vertebrobasilar insufficiency  21. Lethargy: much improved, diet upgraded to D2/honey, BUN reviewed and improved  22. Decreased appetite: improved, decrease TF to 22mls/hour to encourage oral appetite, much improved and now eating 20% of meals. Messaged nursing to educate patient that if she can eat 50% of lunch we can remove Cortrak, discussed with patient and this is her goal. Diet upgraded to D2/thins!  23. Decreased hearing left ear: discussed may be due to CN 8 being affected by stroke. Debrox ear drops ordered in case wax buildup is contributing  24. ADHD: Increase Vyvanse to 30mg  daily.   25. Constipation: d/c scheduled zofran since nausea has resolved -11/03/22 no BM since 10/28/22, pt very hesitant to taking stool softeners/laxatives due to prior diarrhea; discussed that constipation can lead to overflow diarrhea too so we really need to address the constipation; will start with miralax daily for now and see how things go.  -11/04/22 still no BM, pt now agreeable to be more aggressive with bowel regimen; didn't tolerate sorbitol (cramping) and CrCl too low for Mg Citrate, will try double dose of miralax this morning and ordered 17.2mg  senna daily-- hopefully this helps. If pt starts feeling more lower abd/back pressure, would consider enema to help, but I think it won't be as helpful yet.   26. Oral irritation/burning: no definite thrush but nystatin started by Dr. Benjie Karvonen-- continue this, ordered magic mouthwash with lidocaine to help. Could be related to thickener but feel this is less likely given that she's been using it for a while. No swelling or s/sx anaphylaxis. Monitor.   27. MASD: Gerrhard's butt cream ordered  LOS: 11 days A FACE TO FACE EVALUATION WAS  PERFORMED  Drema Pry Sahmya Arai 11/05/2022, 10:55 AM

## 2022-11-06 ENCOUNTER — Ambulatory Visit: Payer: BC Managed Care – PPO | Admitting: Internal Medicine

## 2022-11-06 LAB — CBC WITH DIFFERENTIAL/PLATELET
Abs Immature Granulocytes: 0.04 10*3/uL (ref 0.00–0.07)
Basophils Absolute: 0 10*3/uL (ref 0.0–0.1)
Basophils Relative: 1 %
Eosinophils Absolute: 0.1 10*3/uL (ref 0.0–0.5)
Eosinophils Relative: 3 %
HCT: 32.8 % — ABNORMAL LOW (ref 36.0–46.0)
Hemoglobin: 10.9 g/dL — ABNORMAL LOW (ref 12.0–15.0)
Immature Granulocytes: 1 %
Lymphocytes Relative: 30 %
Lymphs Abs: 1.4 10*3/uL (ref 0.7–4.0)
MCH: 30.2 pg (ref 26.0–34.0)
MCHC: 33.2 g/dL (ref 30.0–36.0)
MCV: 90.9 fL (ref 80.0–100.0)
Monocytes Absolute: 0.6 10*3/uL (ref 0.1–1.0)
Monocytes Relative: 12 %
Neutro Abs: 2.5 10*3/uL (ref 1.7–7.7)
Neutrophils Relative %: 53 %
Platelets: 241 10*3/uL (ref 150–400)
RBC: 3.61 MIL/uL — ABNORMAL LOW (ref 3.87–5.11)
RDW: 15.6 % — ABNORMAL HIGH (ref 11.5–15.5)
WBC: 4.6 10*3/uL (ref 4.0–10.5)
nRBC: 0.4 % — ABNORMAL HIGH (ref 0.0–0.2)

## 2022-11-06 MED ORDER — LISDEXAMFETAMINE DIMESYLATE 20 MG PO CAPS
40.0000 mg | ORAL_CAPSULE | Freq: Every day | ORAL | Status: DC
  Administered 2022-11-07: 40 mg via ORAL
  Filled 2022-11-06: qty 2

## 2022-11-06 NOTE — Progress Notes (Signed)
Occupational Therapy Session Note  Patient Details  Name: Joan Robinson MRN: [id number] Date of Birth: [birth year]  Today's Date: 11/06/2022 OT Individual Time: 4098-1191 OT Individual Time Calculation (min): 45 min    Short Term Goals: Week 2:  OT Short Term Goal 1 (Week 2): STG=LTG due to LOS  Skilled Therapeutic Interventions/Progress Updates:   Pt seen for skilled OT session this am with dtr and grand son present bedside.  TEDS applied as reports of lower BP and Dr R in session to confirm use. No drops in BP from baseline 114/ 73 at rest then 112/68 at end of session. Some improvement reported in L eye as far as light and lid management with external taping and patching. Reports may be getting out Coretrak as she has now been drinking thin liquds and OT encouraged for hydration. Bed to EOB with CGA, transfer with RW to w/c with CGA and cues. W/c level oral and hair care with L UE integration with set up and support for L hand FMC. OT issued and trained in light resistance foam cube and ankle pumps for distal strength and overall cdp mngt.  Left pt w/c level at end of session with family bedside and all needs and safety measures in place.   Pain: denies any pain   Therapy Documentation Precautions:  Precautions Precautions: Fall Precaution Comments: 02, cortrak, indwelling cath, vision deficit, ataxia Restrictions Weight Bearing Restrictions: No  Therapy/Group: Individual Therapy  Vicenta Dunning 11/06/2022, 7:35 AM

## 2022-11-06 NOTE — Progress Notes (Signed)
Physical Therapy Session Note  Patient Details  Name: Joan Robinson MRN: [id number] Date of Birth: 03-06-1960  Today's Date: 11/06/2022 PT Individual Time: 1100-1200 + 1445-1530 PT Individual Time Calculation (min): 60 min  + 45 min  Short Term Goals: Week 2:  PT Short Term Goal 1 (Week 2): Pt will complete bed<>chair transfers with CGA and LRAD PT Short Term Goal 2 (Week 2): Pt will ambulate 136ft with minA and LRAD PT Short Term Goal 3 (Week 2): Pt will initiate stair training  Skilled Therapeutic Interventions/Progress Updates:      1st session: Pt lying in bed to start - agreeable to PT tx. No reports of pain. Focused session on completing wheelchair evaluation with [facility], [facility].   Supine<>sitting EOB with supervision with HOB elevated. Completed Stand pivot transfer with CGA/minA and no AD to her wheelchair. Transported to day room rehab gym to complete wheelchair assessment. Determined what would best fit her needs: - Helio A6 17x18"  - Standard push to lock handles with extenders - Adjustable height arm rests with desk length flip back  - Axiom S seat cushion - Tension adjustable back - Flat free tires - Standard hip belt -Tacky rims  Pt assisted on the Nustep and completed 4 minutes total at L6 resistance, BLE only to isolate for coordination and strength training. 157 steps total.   Worked on propelling her wheelchair with BUE from day room to her room - ~11ft. minA to start, progressing to supervision. She has difficulty locating and gripping the metal hand rims due to her ataxia and sensory deficits. Needed hand over hand assist for starting.   Assisted back to bed with minA stand pivot transfer. Left in bed with alarm on, call bell in reach.   2nd session: Pt in bed to start. Bed mobility completed without assist with HOB Raised. Compeltes stand pivot transfer with CGA and no AD with safety cues for approach to her wheelchair.   Transported to main  rehab gym.   Stair training completed using 6" steps and 2 hand rails. CGA for ascending and minA for descending with a total of x12 steps and no rest break. Step-to pattern while forward facing, ataxic with her LLE and mildly misjudging step height for both directions. Patient confirms a ramp is being built for home entrance and should be ready by DC.  Gait training completed with RW on level surfaces. Used visual cue for visual stabilization at a distant target (I.e. exit signs) while she ambulated to help keep her midline and reduce L trunk lean. She's progressing with balance and needing only CGA for the first ~53ft as long as she is ambulating in a straight path - needs minA for turns or non-linear gait or distances >6ft.   Car transfer completed at CGA/minA level with cues for safety approach, hand placement, and technique. Pt with impaired depth perception, mis-judging hand placement while reaching for car handle overhead resulting in minor LOB.   Returned to her room and patient assisted back to bed. Left in bed with alarm on, call bell within reach.   Therapy Documentation Precautions:  Precautions Precautions: Fall Precaution Comments: 02, cortrak, indwelling cath, vision deficit, ataxia Restrictions Weight Bearing Restrictions: No General:     Therapy/Group: Individual Therapy  Ermias Tomeo P Kai Railsback PT 11/06/2022, 7:42 AM

## 2022-11-06 NOTE — Progress Notes (Signed)
PROGRESS NOTE   Subjective/Complaints: Got great report from SLP, patient eating food from family well, order to remove Cortrak placed and discussed with patient and daughter  ROS: +anxiety- improved. See HPI. Denies CP, SOB, abd pain, +vertigo- improved, decreased sensation to right hand, +decreased hearing from left ear, nausea improved   Objective:   DG Swallowing Func-Speech Pathology  Result Date: 11/05/2022 Table formatting from the original result was not included. Modified Barium Swallow Study Patient Details Name: KATARZYNA WENTE MRN: [id number] Date of Birth: [birth year] Today's Date: 11/05/2022 HPI/PMH: HPI: 62 yo admitted 9/28 with RUQ pain with SBO.  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 PMhx: HTN, HLD, peptic ulcer disease, diverticulitis, bipolar disorder, hypothyroidism, GERD, Hashimoto's disease. Hemicolectomy completed on 10/1. Patient completed MBS 10/22/22 revealing oropharyngeal dysphagia with initiation of Dys1/HTL diet. Silent aspiraiton observed with NTL and thin liquids. Clinical Impression: Clinical Impression: Pt presents with moderate oral and mild pharyngeal dysphagia. Oral phase characterized by reduced labial seal (resulting in anterior bolus escape beyond the mid chin with thin liquids from teaspoon), impaired/slowed mastication, slowed AP transit, and mild-moderate oral residue after the initial swallow. Patient sensate to residue and works to clear independently. Pharyngeally, deficits characterized by reduced pharyngeal constriction and reduced BOT retraction to the posterior pharyngeal wall resulting in trace-mild residue in the vallecula with all trialed consistencies. Both orally and pharyngeally, amount of residue increases alongside bolus viscosity. Patient continues to present with trismus that makes oral acceptance of larger  bites of food difficult, though this is improving. No significant penetration/aspiration observed throughout all trials. Straw bolus acceptance minimized anterior bolus loss and adding an additional dry swallow after solids mitigated residue in the vallecula. Recommend diet upgrade to Dys2/thin liquid diet, adding additional swallow intermittently to clear residue. Family updated to results and in agreement with plan. Factors that may increase risk of adverse event in presence of aspiration [name] & [name] 2021): Factors that may increase risk of adverse event in presence of aspiration [name] & [name] 2021): Weak cough; Presence of tubes (ETT, trach, NG, etc.) Recommendations/Plan: Swallowing Evaluation Recommendations Swallowing Evaluation Recommendations Recommendations: PO diet PO Diet Recommendation: Dysphagia 2 (Finely chopped); Thin liquids (Level 0) Liquid Administration via: Cup; Spoon; Straw Medication Administration: Crushed with puree Supervision: Full supervision/cueing for swallowing strategies; Patient able to self-feed Swallowing strategies  : Slow rate; Check for pocketing or oral holding; Check for anterior loss; Multiple dry swallows after each bite/sip Postural changes: Position pt fully upright for meals; Stay upright 30-60 min after meals Oral care recommendations: Oral care BID (2x/day) Treatment Plan Treatment Plan Treatment recommendations: Therapy as outlined in treatment plan below Follow-up recommendations: Outpatient SLP Functional status assessment: Patient has had a recent decline in their functional status and demonstrates the ability to make significant improvements in function in a reasonable and predictable amount of time. Treatment frequency: Min 2x/week Treatment duration: 2 weeks Interventions: Patient/family education; Trials of upgraded texture/liquids; Diet toleration management by SLP; Oropharyngeal exercises; Compensatory techniques Recommendations Recommendations for  follow up therapy are one component of a multi-disciplinary discharge planning process, led by the attending physician.  Recommendations may  be updated based on patient status, additional functional criteria and insurance authorization. Assessment: Orofacial Exam: Orofacial Exam Oral Cavity: Oral Hygiene: WFL Oral Cavity - Dentition: Poor condition; Missing dentition Orofacial Anatomy: WFL Oral Motor/Sensory Function: Suspected cranial nerve impairment CN V - Trigeminal: Left motor impairment CN VII - Facial: Left motor impairment CN IX - Glossopharyngeal, CN X - Vagus: Left motor impairment CN XII - Hypoglossal: Left motor impairment Anatomy: Anatomy: WFL Boluses Administered: Boluses Administered Boluses Administered: Thin liquids (Level 0); Mildly thick liquids (Level 2, nectar thick); Moderately thick liquids (Level 3, honey thick); Puree; Solid  Oral Impairment Domain: Oral Impairment Domain Lip Closure: Escape beyond mid-chin Tongue control during bolus hold: Posterior escape of less than half of bolus Bolus preparation/mastication: Disorganized chewing/mashing with solid pieces of bolus unchewed Bolus transport/lingual motion: Slow tongue motion; Repetitive/disorganized tongue motion Oral residue: Residue collection on oral structures Location of oral residue : Lateral sulci; Tongue Initiation of pharyngeal swallow : Posterior laryngeal surface of the epiglottis  Pharyngeal Impairment Domain: Pharyngeal Impairment Domain Soft palate elevation: No bolus between soft palate (SP)/pharyngeal wall (PW) Laryngeal elevation: Complete superior movement of thyroid cartilage with complete approximation of arytenoids to epiglottic petiole Anterior hyoid excursion: Complete anterior movement Epiglottic movement: Complete inversion Laryngeal vestibule closure: Complete, no air/contrast in laryngeal vestibule Pharyngeal stripping wave : Present - diminished Pharyngeal contraction (A/P view only): N/A Pharyngoesophageal  segment opening: Complete distension and complete duration, no obstruction of flow Tongue base retraction: Trace column of contrast or air between tongue base and PPW Pharyngeal residue: Trace residue within or on pharyngeal structures Location of pharyngeal residue: Valleculae  Esophageal Impairment Domain: Esophageal Impairment Domain Esophageal clearance upright position: -- (not observed) Pill: Pill Consistency administered: -- (not tested) Penetration/Aspiration Scale Score: Penetration/Aspiration Scale Score 1.  Material does not enter airway: Mildly thick liquids (Level 2, nectar thick); Moderately thick liquids (Level 3, honey thick); Puree; Solid 2.  Material enters airway, remains ABOVE vocal cords then ejected out: Thin liquids (Level 0) Compensatory Strategies: Compensatory Strategies Compensatory strategies: Yes Straw: Effective Effective Straw: Thin liquid (Level 0) Multiple swallows: Effective Effective Multiple Swallows: Solid; Puree; Thin liquid (Level 0)   General Information: Caregiver present: No  Diet Prior to this Study: Moderately thick liquids (Level 3, honey thick); Dysphagia 2 (finely chopped)   Temperature : Normal   Respiratory Status: WFL   Supplemental O2: None (Room air)   History of Recent Intubation: Yes  Behavior/Cognition: Alert; Cooperative; Pleasant mood Self-Feeding Abilities: Needs set-up for self-feeding Baseline vocal quality/speech: Normal Volitional Cough: Able to elicit Volitional Swallow: Able to elicit Exam Limitations: No limitations Goal Planning: Prognosis for improved oropharyngeal function: Good No data recorded No data recorded Patient/Family Stated Goal: upgraded diet, improved oral strength Consulted and agree with results and recommendations: Patient; Family member/caregiver Pain: Pain Assessment Pain Assessment: No/denies pain End of Session: Start Time:No data recorded Stop Time: No data recorded Time Calculation:No data recorded Charges: No data recorded SLP  visit diagnosis: SLP Visit Diagnosis: Dysphagia, oropharyngeal phase (R13.12) Past Medical History: Past Medical History: Diagnosis Date  Allergic rhinitis   Anemia 10/10/2011  Anxiety and depression 01/17/2007  Qualifier: Diagnosis of  By: Everardo All MD, Sean A   Arthritis 07/24/2013  Likely inflammatory and following with Dr Maryln Gottron of [facility]  rheumatology  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  BCC (basal cell carcinoma of skin) 06/01/2012  Leg Follows with Dr Margo Aye  Bipolar disorder [facility]) 01/17/2007  Qualifier: Diagnosis of  By: Everardo All MD, Cleophas Dunker  Cataract   Diverticulosis   Diverticulosis   Emphysema of lung (HCC)   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  Gallstones   GERD (gastroesophageal reflux disease)   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  Hashimoto's disease   Hyperlipidemia   Hypertension   Hypothyroidism 08/24/2006  Qualifier: Diagnosis of  By: Everardo All MD, Sean A    IBS (irritable bowel syndrome) 07/27/2016  Obesity 11/01/2013  PUD (peptic ulcer disease)   Sleep apnea 04/27/2016  Tobacco abuse  Past Surgical History: Past Surgical History: Procedure Laterality Date  ABDOMINAL HYSTERECTOMY    ABDOMINAL HYSTERECTOMY  01/15/2009  complete  COLONOSCOPY    DILATION AND CURETTAGE OF UTERUS  01/16/1983  EYE SURGERY  03/03/2014  Surgery on both eyes for epiretinal membrane (vitreous peel)  Gated Spect wall motion stress cardiolite  11/05/2001  HIATAL HERNIA REPAIR N/A 07/12/2021  Procedure: LAPAROSCOPY W/ EXTENSIVE FOREGUT DISSECTION; PARTIAL STOMACH REDUCTION; GASTROSTOMY TUBE PLACEMENT; GASTROPEXY;  Surgeon: Luretha Murphy, MD;  Location: [facility];  Service: General;  Laterality: N/A;  LAPAROSCOPIC RIGHT HEMI COLECTOMY Right 10/16/2022  Procedure: LAPAROSCOPIC ASSISTED RIGHT HEMI COLECTOMY;  Surgeon: Andria Meuse, MD;  Location: [facility] OR;  Service: General;  Laterality: Right;  POLYPECTOMY    TUBAL LIGATION  01/15/1993  UTERINE SUSPENSION    mesh  VITRECTOMY Bilateral 03/03/2014  XI ROBOTIC ASSISTED HIATAL HERNIA REPAIR N/A  05/01/2021  Procedure: XI ROBOTIC ASSISTED TYPE III HIATAL HERNIA REPAIR WITH FUNDOPLICATION;  Surgeon: Luretha Murphy, MD;  Location: [facility];  Service: General;  Laterality: N/AYetta Barre 11/05/2022, 10:06 AM  Recent Labs    11/05/22 0606 11/06/22 0742  WBC 4.0 4.6  HGB 10.6* 10.9*  HCT 31.7* 32.8*  PLT 248 241    Recent Labs    11/05/22 0606  NA 137  K 4.2  CL 100  CO2 25  GLUCOSE 101*  BUN 16  CREATININE 0.85  CALCIUM 9.9    No intake or output data in the 24 hours ending 11/06/22 1257        Physical Exam: Vital Signs Blood pressure 92/64, pulse 74, temperature 97.8 F (36.6 C), resp. rate 18, height 5\' 6"  (1.676 m), weight 67.7 kg, SpO2 98%. Constitutional:      Comments: in bed resting, NAD HENT:     Head: Normocephalic and atraumatic.     Comments: L facial droop L eye will not close- lower eyelid droops, patched Cortrak in place- TF's off, meds being given by tube    Right Ear: External ear normal.     Left Ear: External ear normal.     Nose: Nose normal. No congestion.     Mouth/Throat:     Mouth: Mucous membranes are dry.     Pharynx: Oropharynx is clear. No oropharyngeal exudate.      Eyes:     Comments: Left lower lid everted and erythematous-- patched Cardiovascular:     Rate and Rhythm: Normal rate and regular rhythm.     Heart sounds: Normal heart sounds. No murmur heard.    No gallop.  Pulmonary: CTAB in all lung fields, no w/r/r, no hypoxia or increased WOB, speaking in full sentences Abdominal:     General: Bowel sounds are normal.     Comments: Dressing over midline incision intact- just changed C/D/I +BS throughout although definitely hypoactive, soft, nonTTP, nondistended.  Psych: pleasant, agreeable and cooperative, less anxious than prior weekends  PRIOR EXAMS: Neuro: L facial droop, decreased hearing left ear. Alert, some memory deficits, decreased sensation to right  side of face and right hand, +vertebrobasilar  insufficiency Musculoskeletal:     Cervical back: Neck supple. No tenderness.     Comments: 5/5 strength in upper extremities and 4/5 in lower extremities otherwise stable 10/22 Skin:    General: Skin is warm and dry.     Comments: IV L AC fossa and R forearm- look OK      Assessment/Plan: 1. Functional deficits which require 3+ hours per day of interdisciplinary therapy in a comprehensive inpatient rehab setting. Physiatrist is providing close team supervision and 24 hour management of active medical problems listed below. Physiatrist and rehab team continue to assess barriers to discharge/monitor patient progress toward functional and medical goals  Care Tool:  Bathing    Body parts bathed by patient: Right arm, Left arm, Chest, Abdomen, Front perineal area, Buttocks, Right upper leg, Left upper leg, Right lower leg, Face, Left lower leg   Body parts bathed by helper: Right arm, Left lower leg, Right lower leg, Buttocks, Front perineal area, Right upper leg, Left upper leg     Bathing assist Assist Level: Contact Guard/Touching assist     Upper Body Dressing/Undressing Upper body dressing   What is the patient wearing?: Pull over shirt    Upper body assist Assist Level: Minimal Assistance - Patient > 75%    Lower Body Dressing/Undressing Lower body dressing      What is the patient wearing?: Pants, Underwear/pull up     Lower body assist Assist for lower body dressing: Contact Guard/Touching assist     Toileting Toileting Toileting Activity did not occur (Clothing management and hygiene only): N/A (no void or bm)  Toileting assist Assist for toileting: Minimal Assistance - Patient > 75%     Transfers Chair/bed transfer  Transfers assist  Chair/bed transfer activity did not occur: Safety/medical concerns  Chair/bed transfer assist level: Contact Guard/Touching assist     Locomotion Ambulation   Ambulation assist      Assist level: 2  helpers Assistive device: Walker-rolling Max distance: 12ft   Walk 10 feet activity   Assist     Assist level: 2 helpers Assistive device: Walker-rolling   Walk 50 feet activity   Assist Walk 50 feet with 2 turns activity did not occur: Safety/medical concerns (fatigue)  Assist level: 2 helpers Assistive device: Walker-rolling    Walk 150 feet activity   Assist Walk 150 feet activity did not occur: Safety/medical concerns         Walk 10 feet on uneven surface  activity   Assist Walk 10 feet on uneven surfaces activity did not occur: Safety/medical concerns         Wheelchair     Assist Is the patient using a wheelchair?: Yes Type of Wheelchair: Manual    Wheelchair assist level: Dependent - Patient 0% Max wheelchair distance: 71ft    Wheelchair 50 feet with 2 turns activity    Assist        Assist Level: Dependent - Patient 0%   Wheelchair 150 feet activity     Assist      Assist Level: Dependent - Patient 0%   Blood pressure 92/64, pulse 74, temperature 97.8 F (36.6 C), resp. rate 18, height 5\' 6"  (1.676 m), weight 67.7 kg, SpO2 98%.  Medical Problem List and Plan: 1. Functional deficits secondary to L PICA/AICA stroke with cerebellar edema             -patient may  shower- if cover incisions             -  ELOS/Goals: 10-12 days supervision             -Continue CIR  Head CT reviewed with patient 10/14 and improvements discussed   Disability paperwork given to [facility] [address] for completion 2.  Antithrombotics: -DVT/anticoagulation:  Pharmaceutical: Heparin 5000U q8h             -antiplatelet therapy: Plavix 75mg  daily   3. Chronic back pain: kpad ordered. Tylenol as needed             -continue Norco 5/325 one tablet q HS             Robaxin d/ced due to hypotension   4. Anxiety: lexapro restarted daily at 10mg               -continue trazodone 50 mg q HS-- timing changed to 8pm -continue melatonin 10 mg q HS-- d/c'd but unclear  why, not sleeping well, reordered on 10/27/22             -antipsychotic agents: n/a   5. Neuropsych/cognition: This patient is? capable of making decisions on her (since sedated, cannot tell) own behalf.   6. Skin/Wound Care: Routine skin care checks             -continue packing of lower aspect of midline incision BID   7. 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 Urinary retention: placed order for foley removal 10/27/22-- +UCx on 10/14, completed 3 days of Bactrim given  -10/27/22 foley ordered to be removed today, ordered PVRs to monitor -10/28/22 foley removed, one cath overnight for but otherwise low PVRs--monitor -11/04/22 doing well, resolved.  18. Leukocytosis: WBC reviewed 10/14 and has resolved  19. Nausea: seems to be mostly with movement/transitions, has zofran ordered but will trial meclizine to see if this helps with the movement aspect (pt doesn't state she's dizzy but how she describes her symptoms seems to have a rotational/movement component); started with 12.5mg  TID PRN, could always go up also.  -schedule zofran IV  daily at 7am, discussed this helped, continue, messaged nursing regarding last BM since Zofran can be constipating.   20. Vertigo: d/c scopolamine patch, d/c meclizine since not helpful, discussed 2/2 vertebrobasilar insufficiency  21. Lethargy: much improved, diet upgraded to D2/honey, BUN reviewed and improved, increased Vyvanse to 40mg   daily  22. Decreased appetite: improved, d/c cortrak. Diet upgraded to D2/thins!  23. Decreased hearing left ear: discussed may be due to CN 8 being affected by stroke. Debrox ear drops ordered in case wax buildup is contributing  24. ADHD: Increase Vyvanse to 40mg  daily.   25. Constipation: d/c scheduled zofran since nausea has resolved -11/03/22 no BM since 10/28/22, pt very hesitant to taking stool softeners/laxatives due to prior diarrhea; discussed that constipation can lead to overflow diarrhea too so we really need to address the constipation; will start with miralax daily for now and see how things go.  -11/04/22 still no BM, pt now agreeable to be more aggressive with bowel regimen; didn't tolerate sorbitol (cramping) and CrCl too low for Mg Citrate, will try double dose of miralax this morning and ordered 17.2mg  senna daily-- hopefully this helps. If pt starts feeling more lower abd/back pressure, would consider enema to help, but I think it won't be as helpful yet.   26. Oral irritation/burning: no definite thrush but nystatin started by Dr. Benjie Karvonen-- continue this, ordered magic mouthwash with lidocaine to help. Could be related to thickener but feel this is less likely given that she's been using it for a while. No swelling or s/sx anaphylaxis. Monitor.   27. MASD: Gerrhard's butt cream ordered, changed to prn  28. Visual deficits: encouraged outpatient follow-up with opthalmologist LOS: 12 days A FACE TO FACE EVALUATION WAS PERFORMED  Drema Pry Jasraj Lappe 11/06/2022, 12:57 PM

## 2022-11-06 NOTE — Progress Notes (Signed)
Speech Language Pathology Daily Session Note  Patient Details  Name: Joan Robinson MRN: [id number] Date of Birth: [birth year]  Today's Date: 11/06/2022 SLP Individual Time: 0801-0900 SLP Individual Time Calculation (min): 59 min  Short Term Goals: Week 2: SLP Short Term Goal 1 (Week 2): Patient will complete updated swallow study to assess appropriateness for liquid upgrade. SLP Short Term Goal 2 (Week 2): Patient will demonstrate improvement to stroke-induced trismus to promote opening of the mouth for bolus acceptance and speech. SLP Short Term Goal 3 (Week 2): Patient will demonstrate problem solving skills during mildly complex problems in 4/5 opportunities given min assist. SLP Short Term Goal 4 (Week 2): Patient will utilize memory external aids to recall daily information with 85% accuracy given min assist. SLP Short Term Goal 5 (Week 2): Patient will orient to date and time utilizing external aids with supervision.  Skilled Therapeutic Interventions: SLP conducted skilled therapy session targeting dysphagia management and cognitive retraining goals. SLP and patient discussed food intake with patient lamenting that she does not like the taste of hospital food but has been eating food that her family brings in. SLP assessed patient's tolerance of yogurt, pudding, and Dys2 minced fruit. Then, patient's family brought patient a [employer] gravy biscuit and independently cut food item into small pieces. With all food items, no overt s/sx of difficulty nor penetration/aspiration observed. Patient cognizant of food items that work for her and independently states appropriate foods. Recommend continuation of Dys2/thin liquid diet, though in conversation with family, family is liberally permitted to bring in items outside of Dys2 diet as long as they are soft, easy to chew, and easy to cut into small pieces. Nursing entered during session, and SLP assessed patient's tolerance of taking medications  whole in puree. With small pills, patient exhibited no difficulty. Patient exhibited more difficulty with larger pills, though independently cleared with liquid wash. Updated room signs and alerted nursing to permit patient to take pills whole, one at a time, in puree/pudding. Cut larger pills into smaller pieces. Cognitively, patient disoriented to date and unable to see room calendar. SLP repositioned room calendar to accommodate for patient's vision changes. Patient oriented to day of the week with modI. Patient recalls major daily and environmental events independently but requires min assist to recall smaller, less significant details from the day. Patient was left in lowered bed with call bell in reach and bed alarm set. SLP will continue to target goals per plan of care.       Pain Pain Assessment Pain Scale: 0-10 Pain Score: 0-No pain  Therapy/Group: Individual Therapy  Jeannie Done, M.A., CCC-SLP  Yetta Barre 11/06/2022, 8:41 AM

## 2022-11-06 NOTE — Progress Notes (Signed)
Patient ID: Joan Robinson, female   DOB: Sep 15, 1960, 62 y.o.   MRN: [id number]  Have faxed pt's [employer]-FMLA forms and given originals back to daughter. Pt happy she passed her swallow test today-getting her cortrak out.

## 2022-11-07 MED ORDER — LISDEXAMFETAMINE DIMESYLATE 30 MG PO CAPS
50.0000 mg | ORAL_CAPSULE | Freq: Every day | ORAL | Status: DC
  Administered 2022-11-08: 50 mg via ORAL
  Filled 2022-11-07: qty 1

## 2022-11-07 NOTE — Patient Care Conference (Signed)
Inpatient RehabilitationTeam Conference and Plan of Care Update Date: 11/07/2022   Time: 11:12 AM    Patient Name: Joan Robinson      Medical Record Number: [id number]  Date of Birth: 1960/07/15 Sex: Female         Room/Bed: 4W14C/4W14C-01 Payor Info: Payor: BLUE CROSS BLUE SHIELD / Plan: BCBS COMM PPO / Product Type: *No Product type* /    Admit Date/Time:  10/25/2022  5:34 PM  Primary Diagnosis:  CVA (cerebral vascular accident) [facility])  Hospital Problems: Principal Problem:   CVA (cerebral vascular accident) (HCC) Active Problems:   Malnutrition of moderate degree   Dysphagia   Vestibular dizziness    Expected Discharge Date: Expected Discharge Date: 11/13/22  Team Members Present: Physician leading conference: Dr. Sula Soda Social Worker Present: Dossie Der, LCSW Nurse Present: Chana Bode, RN PT Present: Wynelle Link, PT OT Present: Bretta Bang, OT SLP Present: Jeannie Done, SLP PPS Coordinator present : Fae Pippin, SLP     Current Status/Progress Goal Weekly Team Focus  Bowel/Bladder   Pt is currently continent B/B with q4-6 bladder sacn.  LBM 11/06/22   Pt will maintain B/B continence   Assist with toileting qshift/prn    Swallow/Nutrition/ Hydration   Dys2/thin liquids   supervision  chewing efficiency, clearing oral cavity with upgraded solids, endurance, diet tolerance    ADL's   Min assist overall BADL, progressing to supervision/ set up   SBA/Min A   LUE functional use - coordination, balance, activity tolerance, family education    Mobility   supervision bed mobility, CGA sit to stand and stand pivot transfers, CGA to minA for gait with RW ~136ft. Improving on her trunk lean and dizzinses with better compensation and strategies. Wheelchair evaluation completed on 10/22 with [employer]   CGA  Activity tolerance, static and dynamic standing balance, NMR for ataxia and L lean    Communication   supervision-minA for use of speech  intelligibility strategies   supervision   use of speech intelligibility strategies consistently at the conversation level    Safety/Cognition/ Behavioral Observations  supervision-minA for mildly complex problem solving, memory of daily events   supervision   mildly complex problem solving, memory of smaller daily details, use of memory strategies    Pain   Denies pain at this time   Pt will be free from pain   Assess pt for pain qshift/prn    Skin   Surgical incision to abdomen   Pt will maintain skin intergrity with no infection at incision site  Assess skin for breakdown and inefction at surgical site and promote healing      Discharge Planning:  Daughter is here daily and has seen her in some of  her therapies. Home health arranged and awaiting equipment recommendations   Team Discussion: Patient with left visual  and hearing deficits and soft BP.  Medications adjusted.  Magic mouthwash for mouth ulcers; cortrak out and maintaining po intake.  Patient on target to meet rehab goals: yes, currently needs CGA - min assist for ADLs and CGA for stand ivot transfers. Needs CGA - min assist for use of a RW due to left lean and ataxia.  Working on Training and development officer and speech has improved; at 100% intelligibility.   *See Care Plan and progress notes for long and short-term goals.   Revisions to Treatment Plan:  MBS 11/05/22   Teaching Needs: Safety, medications, skin care, dietary modifications, transfers, toileting, etc.   Current Barriers to Discharge: none  Possible Resolutions to Barriers: Family education HH follow up services DME: W/C     Medical Summary Current Status: orthostatic hypotenstion, ADHD, dysphagia, left sided hearing and visual deficits  Barriers to Discharge: Medical stability  Barriers to Discharge Comments: orthostatic hypotension, ADHD, dyshagia, left sided hearing and visual deficits Possible Resolutions to [employer] Focus: change to  thigh high teds, upgrade Vyvanse to 50mg  daily, d/ced cortrak, educated regarding deficits and their correlation with stroke and prognosis   Continued Need for Acute Rehabilitation Level of Care: The patient requires daily medical management by a physician with specialized training in physical medicine and rehabilitation for the following reasons: Direction of a multidisciplinary physical rehabilitation program to maximize functional independence : Yes Medical management of patient stability for increased activity during participation in an intensive rehabilitation regime.: Yes Analysis of laboratory values and/or radiology reports with any subsequent need for medication adjustment and/or medical intervention. : Yes   I attest that I was present, lead the team conference, and concur with the assessment and plan of the team.   Chana Bode B 11/07/2022, 3:12 PM

## 2022-11-07 NOTE — Progress Notes (Signed)
Occupational Therapy Session Note  Patient Details  Name: Joan Robinson MRN: [id number] Date of Birth: 1960-06-13  Today's Date: 11/07/2022 OT Individual Time: 0915-1008 OT Individual Time Calculation (min): 53 min    Short Term Goals: Week 2:  OT Short Term Goal 1 (Week 2): STG=LTG due to LOS  Skilled Therapeutic Interventions/Progress Updates:    Patient received seated in wheelchair agreeable to shower.  Patient indicates need to void once in bathroom - transferred to toilet with contact guard and min cueing.  Continent void and able to complete hygiene.  Assisted to undress lower body then walk into shower.  Patient able to wash all parts with set up assistance, and verbal cueing for technique.  Patient beginning to anticipate needs for home - has tub seat and hand held shower.  Transferred to chair and to sink to complete dressing - needing assist for compression hose.  Nursing infomred of need to change dressing on abdominal wound.  Patient assisted back to bed - contact guard, and left in bed with alarm engaged, personal items in reach and daughter at bedside.    Therapy Documentation Precautions:  Precautions Precautions: Fall Precaution Comments: 02, cortrak, indwelling cath, vision deficit, ataxia Restrictions Weight Bearing Restrictions: No   Pain: Pain Assessment Pain Scale: 0-10 Pain Score: 0-No pain     Therapy/Group: Individual Therapy  [name] 11/07/2022, 10:08 AM

## 2022-11-07 NOTE — Progress Notes (Signed)
Patient ID: Joan Robinson, female   DOB: 03-Oct-1960, 62 y.o.   MRN: [id number]  Met with pt and daughter who was present and attended therapies with Mom this am, the team conference update regarding progress in her therapies this week. She is very pleased with her progress and getting her cortrak out and being able to eat. She had a wheelchair evaluation and has loaner to go home with. Have gotten a home health agency to see her at discharge. Will begin with home health and then transition to OP once appropriate. Working toward discharge date of 10/29.

## 2022-11-07 NOTE — Progress Notes (Signed)
Occupational Therapy Session Note  Patient Details  Name: Joan Robinson MRN: [id number] Date of Birth: [birth year]  Today's Date: 11/07/2022 OT Individual Time: 1133-1200 OT Individual Time Calculation (min): 27 min    Short Term Goals: Week 2:  OT Short Term Goal 1 (Week 2): STG=LTG due to LOS  Skilled Therapeutic Interventions/Progress Updates:   Patient received supine in bed - awake and ready for therapy.  Patient walked with walker to dayroom and needing min assist to manage posture and position in hallway.  Worked in standing - worked on Theme park manager for balance over base of support.  Patient stands with posterior bias - hips forward, shoulders back, head tipped backward.  Patient uses head tipping to manage vision in R eye, and also to compensate for visual motor deficit.  Patient able to bring hips forward and shift weight to right, then drop chin and move thoracic trunk into less extension.  Patient with impaired sensation in feet so difficult to use feedback with floor to determine body alignment.  Worked on reducing pressure thru hands on table in standing - also reinforced componenets of sit to stand, stand to sit with more forward translation.  Left up in bed - seated at edge of bed awaiting lunch.  Daughter at bedside.    Therapy Documentation Precautions:  Precautions Precautions: Fall Precaution Comments: 02, cortrak, indwelling cath, vision deficit, ataxia Restrictions Weight Bearing Restrictions: No   Pain:  No pain - reports fullness/ pressure in back of head    Therapy/Group: Individual Therapy  [name] 11/07/2022, 12:09 PM

## 2022-11-07 NOTE — Progress Notes (Signed)
Speech Language Pathology Daily Session Note  Patient Details  Name: CONCEPSION MCALISTER MRN: [id number] Date of Birth: 1960/03/19  Today's Date: 11/07/2022 SLP Individual Time: 1349-1445 SLP Individual Time Calculation (min): 56 min  Short Term Goals: Week 2: SLP Short Term Goal 1 (Week 2): Patient will complete updated swallow study to assess appropriateness for liquid upgrade. SLP Short Term Goal 2 (Week 2): Patient will demonstrate improvement to stroke-induced trismus to promote opening of the mouth for bolus acceptance and speech. SLP Short Term Goal 3 (Week 2): Patient will demonstrate problem solving skills during mildly complex problems in 4/5 opportunities given min assist. SLP Short Term Goal 4 (Week 2): Patient will utilize memory external aids to recall daily information with 85% accuracy given min assist. SLP Short Term Goal 5 (Week 2): Patient will orient to date and time utilizing external aids with supervision.  Skilled Therapeutic Interventions: SLP conducted skilled therapy session targeting dysphagia management and cognitive retraining goals. Patient independently reports strong self-monitoring skills re: foods/textures she is able to tolerate/chew. Patient reports that though minced, the chicken tenders she received for lunch were difficult to chew, thus she ordered new items. Patient's most prominent deficit continues to be jaw range of motion. SLP provided patient with larger jaw stretching apparatus and guided patient through 5 sets of 30 second long jaw stretches. Patient encouraged to utilize jaw stretches and massage throughout the day to promote loosening of the jaw. Patient independently provided ideas for massaging jaw. During cognitive tasks, patient independently recalling purposes of 100% of medications as well as times taken and times refused/reasons. Patient independently recalls events from earlier in the stay and states previous cognitive deficits that have now largely  resolved. Patient oriented to date with modI. Patient was left in lowered bed with call bell in reach and bed alarm set. SLP will continue to target goals per plan of care.      Pain Pain Assessment Pain Scale: 0-10 Pain Score: 0-No pain  Therapy/Group: Individual Therapy  Jeannie Done, M.A., CCC-SLP  Yetta Barre 11/07/2022, 2:42 PM

## 2022-11-07 NOTE — Progress Notes (Signed)
PROGRESS NOTE   Subjective/Complaints: Wants to start with home health Speech improving Orthostasis improving but still present, will switch to thigh high teds  ROS: +anxiety- improved. See HPI. Denies CP, SOB, abd pain, +vertigo- improved, decreased sensation to right hand, +decreased hearing from left ear, nausea improved, +orthostasis   Objective:   No results found. Recent Labs    11/05/22 0606 11/06/22 0742  WBC 4.0 4.6  HGB 10.6* 10.9*  HCT 31.7* 32.8*  PLT 248 241    Recent Labs    11/05/22 0606  NA 137  K 4.2  CL 100  CO2 25  GLUCOSE 101*  BUN 16  CREATININE 0.85  CALCIUM 9.9     Intake/Output Summary (Last 24 hours) at 11/07/2022 1117 Last data filed at 11/07/2022 0758 Gross per 24 hour  Intake 240 ml  Output --  Net 240 ml          Physical Exam: Vital Signs Blood pressure 92/71, pulse 74, temperature 97.9 F (36.6 C), temperature source Oral, resp. rate 18, height 5\' 6"  (1.676 m), weight 67.7 kg, SpO2 98%. Constitutional:      Comments: Working in therapy gym HENT:     Head: Normocephalic and atraumatic.     Comments: L facial droop L eye will not close- lower eyelid droops, patched Cortrak in place- TF's off, meds being given by tube    Right Ear: External ear normal.     Left Ear: External ear normal.     Nose: Nose normal. No congestion.     Mouth/Throat:     Mouth: Mucous membranes are dry.     Pharynx: Oropharynx is clear. No oropharyngeal exudate.      Eyes:     Comments: Left lower lid everted and erythematous-- patched Cardiovascular:     Rate and Rhythm: Normal rate and regular rhythm.     Heart sounds: Normal heart sounds. No murmur heard.    No gallop.  Pulmonary: CTAB in all lung fields, no w/r/r, no hypoxia or increased WOB, speaking in full sentences Abdominal:     General: Bowel sounds are normal.     Comments: Dressing over midline incision intact- just  changed C/D/I +BS throughout although definitely hypoactive, soft, nonTTP, nondistended.  Psych: pleasant, agreeable and cooperative, less anxious than prior weekends  PRIOR EXAMS: Neuro: L facial droop, decreased hearing left ear. Alert, some memory deficits, decreased sensation to right side of face and right hand, +vertebrobasilar insufficiency Musculoskeletal:     Cervical back: Neck supple. No tenderness.     Comments: 5/5 strength in upper extremities and 5/5 in lower extremities  Skin:    General: Skin is warm and dry.     Comments: IV L AC fossa and R forearm- look OK      Assessment/Plan: 1. Functional deficits which require 3+ hours per day of interdisciplinary therapy in a comprehensive inpatient rehab setting. Physiatrist is providing close team supervision and 24 hour management of active medical problems listed below. Physiatrist and rehab team continue to assess barriers to discharge/monitor patient progress toward functional and medical goals  Care Tool:  Bathing    Body parts bathed by patient: Right  arm, Left arm, Chest, Abdomen, Front perineal area, Buttocks, Right upper leg, Left upper leg, Right lower leg, Face, Left lower leg   Body parts bathed by helper: Right arm, Left lower leg, Right lower leg, Buttocks, Front perineal area, Right upper leg, Left upper leg     Bathing assist Assist Level: Contact Guard/Touching assist     Upper Body Dressing/Undressing Upper body dressing   What is the patient wearing?: Pull over shirt    Upper body assist Assist Level: Minimal Assistance - Patient > 75%    Lower Body Dressing/Undressing Lower body dressing      What is the patient wearing?: Pants, Underwear/pull up     Lower body assist Assist for lower body dressing: Contact Guard/Touching assist     Toileting Toileting Toileting Activity did not occur (Clothing management and hygiene only): N/A (no void or bm)  Toileting assist Assist for toileting:  Minimal Assistance - Patient > 75%     Transfers Chair/bed transfer  Transfers assist  Chair/bed transfer activity did not occur: Safety/medical concerns  Chair/bed transfer assist level: Contact Guard/Touching assist     Locomotion Ambulation   Ambulation assist      Assist level: 2 helpers Assistive device: Walker-rolling Max distance: 110ft   Walk 10 feet activity   Assist     Assist level: 2 helpers Assistive device: Walker-rolling   Walk 50 feet activity   Assist Walk 50 feet with 2 turns activity did not occur: Safety/medical concerns (fatigue)  Assist level: 2 helpers Assistive device: Walker-rolling    Walk 150 feet activity   Assist Walk 150 feet activity did not occur: Safety/medical concerns         Walk 10 feet on uneven surface  activity   Assist Walk 10 feet on uneven surfaces activity did not occur: Safety/medical concerns         Wheelchair     Assist Is the patient using a wheelchair?: Yes Type of Wheelchair: Manual    Wheelchair assist level: Dependent - Patient 0% Max wheelchair distance: 64ft    Wheelchair 50 feet with 2 turns activity    Assist        Assist Level: Dependent - Patient 0%   Wheelchair 150 feet activity     Assist      Assist Level: Dependent - Patient 0%   Blood pressure 92/71, pulse 74, temperature 97.9 F (36.6 C), temperature source Oral, resp. rate 18, height 5\' 6"  (1.676 m), weight 67.7 kg, SpO2 98%.  Medical Problem List and Plan: 1. Functional deficits secondary to L PICA/AICA stroke with cerebellar edema             -patient may  shower- if cover incisions             -ELOS/Goals: 10-12 days supervision             -Continue CIR  Head CT reviewed with patient 10/14 and improvements discussed   Disability paperwork given to [facility] for completion 2.  Antithrombotics: -DVT/anticoagulation:  Pharmaceutical: Heparin 5000U q8h             -antiplatelet therapy: Plavix 75mg   daily   3. Chronic back pain: kpad ordered. Tylenol as needed             -continue Norco 5/325 one tablet q HS             Robaxin d/ced due to hypotension   4. Anxiety: lexapro restarted daily at 10mg               -  continue trazodone 50 mg q HS-- timing changed to 8pm -continue melatonin 10 mg q HS-- d/c'd but unclear why, not sleeping well, reordered on 10/27/22             -antipsychotic agents: n/a   5. Neuropsych/cognition: This patient is? capable of making decisions on her (since sedated, cannot tell) own behalf.   6. Skin/Wound Care: Routine skin care checks             -continue packing of lower aspect of midline incision BID   7. 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 Urinary retention: placed order for foley removal 10/27/22-- +UCx on 10/14, completed 3 days of Bactrim given  -10/27/22 foley ordered to be removed today, ordered PVRs to monitor -10/28/22 foley removed, one cath overnight for but otherwise low PVRs--monitor -11/04/22 doing well, resolved.  18. Leukocytosis: WBC reviewed 10/14 and has resolved  19.  Nausea: seems to be mostly with movement/transitions, has zofran ordered but will trial meclizine to see if this helps with the movement aspect (pt doesn't state she's dizzy but how she describes her symptoms seems to have a rotational/movement component); started with 12.5mg  TID PRN, could always go up also.  -schedule zofran IV daily at 7am, discussed this helped, continue, messaged nursing regarding last BM since Zofran can be constipating.   20. Vertigo: d/c scopolamine patch, d/c meclizine since not helpful, discussed 2/2 vertebrobasilar insufficiency. Thigh high teds added for orthostasis  21. Lethargy: much improved, diet upgraded to D2/honey, BUN reviewed and improved, increase Vyvance to 50mg  daily  22. Decreased appetite: improved, d/c cortrak. Diet upgraded to D2/thins!  23. Decreased hearing left ear: discussed may be due to CN 8 being affected by stroke. Debrox ear drops ordered in case wax buildup is contributing  24. ADHD: Increase Vyvanse to 50mg  daily.   25. Constipation: d/c scheduled zofran since nausea has resolved -11/03/22 no BM since 10/28/22, pt very hesitant to taking stool softeners/laxatives due to prior diarrhea; discussed that constipation can lead to overflow diarrhea too so we really need to address the constipation; will start with miralax daily for now and see how things go.  -11/04/22 still no BM, pt now agreeable to be more aggressive with bowel regimen; didn't tolerate sorbitol (cramping) and CrCl too low for  Mg Citrate, will try double dose of miralax this morning and ordered 17.2mg  senna daily-- hopefully this helps. If pt starts feeling more lower abd/back pressure, would consider enema to help, but I think it won't be as helpful yet.  10/23: messaged nursing to confirm that last BM was 10/21  26. Oral irritation/burning: no definite thrush but nystatin started by Dr. Benjie Karvonen-- continue this, ordered magic mouthwash with lidocaine to help. Could be related to  thickener but feel this is less likely given that she's been using it for a while. No swelling or s/sx anaphylaxis. Monitor.   27. MASD: Gerrhard's butt cream ordered, changed to prn, continue  28. Visual deficits: encouraged outpatient follow-up with ophthalmologist, discussed correlation with type of stroke she had LOS: 13 days A FACE TO FACE EVALUATION WAS PERFORMED  Teandre Hamre P Rosalia Mcavoy 11/07/2022, 11:17 AM

## 2022-11-07 NOTE — Progress Notes (Signed)
Nutrition Follow-up  DOCUMENTATION CODES:   Non-severe (moderate) malnutrition in context of chronic illness  INTERVENTION:  -Increase oral supplement, Magic Cup to tid to assist in meeting estimated needs.  -Continue Dysphagia II diet per SLP recommendation.   NUTRITION DIAGNOSIS:   Moderate Malnutrition related to chronic illness (hx esophageal strictures s/p dilations, multiple hernias s/p multiple surgeries, emphysema, prolonged difficulty with PO intake) as evidenced by mild fat depletion, mild muscle depletion, moderate muscle depletion-ongoing, improving    GOAL:   Patient will meet greater than or equal to 90% of their needs    MONITOR:   PO intake, Supplement acceptance, Diet advancement, Labs, Weight trends, TF tolerance, I & O's  REASON FOR ASSESSMENT:   Follow up    ASSESSMENT:   62 year old female with PMHx of esophageal strictures s/p dilation, multiple hernias s/p multiple surgries, GERD/PUD, HTN, HLD, autoimmune urticaria, emphysema, sleep apnea admitted with SBO and was being managed inpatient at [facility]. 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 Admitted to inpatient rehab on 10/25/22.  Intakes recorded average 54% x 8 meals, 100% x 3 most recent documented. Patient is taking the Magic Cups and willing to have three times daily to optimize nutrient intakes.  States she is tolerating the meals, working with SLP. Notes she has had multiple GI surgeries, including hernia repair with Nissen fundoplication and required a feeding tube for about nine weeks last December. She was working on intakes and chewing even before the stroke. Cortrak was removed 11/06/22.  Weight trending down, stable in rehab.  Medications reviewed and include magnesium gluconate 250 mg daily, nystatin, PPI,  miralax, senekot.  Labs reviewed.    Diet Order:   Diet Order             DIET DYS 2 Room service appropriate? Yes with Assist; Fluid consistency: Thin  Diet effective now                   EDUCATION NEEDS:   Education needs have been addressed  Skin:  Skin Assessment: Skin Integrity Issues: Skin Integrity Issues:: Incisions Incisions: abdomen  Last BM:  10/28/22 per chart  Height:   Ht Readings from Last 1 Encounters:  10/25/22 5\' 6"  (1.676 m)    Weight:   Wt Readings from Last 1 Encounters:  11/07/22 68.3 kg    Ideal Body Weight:  59.1 kg  BMI:  Body mass index is 24.3 kg/m.  Estimated Nutritional Needs:   Kcal:  1800-2000  Protein:  90-110 grams  Fluid:  1.8-2 L/day    Alvino Chapel, RDLD Clinical Dietitian See AMION for contact information

## 2022-11-07 NOTE — Progress Notes (Signed)
Physical Therapy Session Note  Patient Details  Name: Joan Robinson MRN: [id number] Date of Birth: January 29, 1960  Today's Date: 11/07/2022 PT Individual Time: 0800-0912 PT Individual Time Calculation (min): 72 min   Short Term Goals: Week 2:  PT Short Term Goal 1 (Week 2): Pt will complete bed<>chair transfers with CGA and LRAD PT Short Term Goal 2 (Week 2): Pt will ambulate 188ft with minA and LRAD PT Short Term Goal 3 (Week 2): Pt will initiate stair training  Skilled Therapeutic Interventions/Progress Updates:      Pt in bed to start with RN present for morning medications. Pt reports no pain.   Supine<>sitting EOB with supervision. Completes stand pivot transfer with CGA and no AD into wheelchair. Transported to main rehab gym.   Daughter, Judeth Cornfield, present during treatment for ongoing family education and training. Reviewed stairs to simulate home entrance. She navigated up/down x12 steps (6") with 2 hand rails with CGA for both directions - completes while forward facing with a step to pattern - improved postural control and less posterior bias compared to yesterday.  Reviewed floor transfers from mat table to floor, completing with minA to get on the floor and CGA to return to mat table. Reviewed home safety training with her daughter and discussed using smart home devices to help in the event of a fall. Pt completed x1 floor transfer using mat table for UE support and x1 floor transfer with no UE support at all. Pt ataxic with transitions.   Pt reporting feeling slightly dizzy/lightheaded after doing floor transfers. BP checked Sitting: 104/76 HR 85 Standing: 105/56 HR 101 *MD made aware  Wheelchair mobility from main rehab gym towards her room with supervision, ~170ft with turns. Patient needing verbal cues for hand placement to rims and equal stroke propulsion to obtain straight path navigation.   Reviewed wheelchair parts/management with patient's daughter and the process  of her getting her personal custom wheelchair through [employer].   Pt ended session seated in w/c with daughter present, upcoming therapy session shortly.   Therapy Documentation Precautions:  Precautions Precautions: Fall Precaution Comments: 02, cortrak, indwelling cath, vision deficit, ataxia Restrictions Weight Bearing Restrictions: No General:      Therapy/Group: Individual Therapy  Ralphie Lovelady P Latashia Koch  PT, DPT, CSRS  11/07/2022, 7:40 AM

## 2022-11-08 LAB — URINALYSIS, ROUTINE W REFLEX MICROSCOPIC
Bilirubin Urine: NEGATIVE
Glucose, UA: NEGATIVE mg/dL
Ketones, ur: NEGATIVE mg/dL
Nitrite: NEGATIVE
Protein, ur: 100 mg/dL — AB
Specific Gravity, Urine: 1.027 (ref 1.005–1.030)
WBC, UA: >50 WBC/hpf (ref 0–5)
pH: 5 (ref 5.0–8.0)

## 2022-11-08 MED ORDER — LISDEXAMFETAMINE DIMESYLATE 30 MG PO CAPS
70.0000 mg | ORAL_CAPSULE | Freq: Every day | ORAL | Status: DC
  Administered 2022-11-09 – 2022-11-13 (×5): 70 mg via ORAL
  Filled 2022-11-08 (×5): qty 2

## 2022-11-08 NOTE — Progress Notes (Signed)
Speech Language Pathology Weekly Progress and Session Note  Patient Details  Name: Joan Robinson MRN: [id number] Date of Birth: 06-26-1960  Beginning of progress report period: November 01, 2022 End of progress report period: November 08, 2022  Today's Date: 11/08/2022 SLP Individual Time: 1104-1200 SLP Individual Time Calculation (min): 56 min  Short Term Goals: Week 2: SLP Short Term Goal 1 (Week 2): Patient will complete updated swallow study to assess appropriateness for liquid upgrade. SLP Short Term Goal 1 - Progress (Week 2): Met SLP Short Term Goal 2 (Week 2): Patient will demonstrate improvement to stroke-induced trismus to promote opening of the mouth for bolus acceptance and speech. SLP Short Term Goal 2 - Progress (Week 2): Met SLP Short Term Goal 3 (Week 2): Patient will demonstrate problem solving skills during mildly complex problems in 4/5 opportunities given min assist. SLP Short Term Goal 3 - Progress (Week 2): Met SLP Short Term Goal 4 (Week 2): Patient will utilize memory external aids to recall daily information with 85% accuracy given min assist. SLP Short Term Goal 4 - Progress (Week 2): Met SLP Short Term Goal 5 (Week 2): Patient will orient to date and time utilizing external aids with supervision. SLP Short Term Goal 5 - Progress (Week 2): Met    New Short Term Goals: Week 3: SLP Short Term Goal 1 (Week 3): STGs=LTGs d/t ELOS  Weekly Progress Updates: Patient is making excellent gains towards therapy goals, meeting 5/5 goals set this reporting period. Patient has returned to cognitive baseline and is modified independent for all basic and mildly complex cognitive tasks including memory of daily events and abstract thought. Patient upgraded to Dys3/thin liquid diet and demonstrates adequate awareness to order appropriate items. Patient's speech is 100% intelligible at the conversation level, though continues to require min assist for use of overarticulation  strategy. Patient and family education ongoing. Patient will continue to benefit from skilled therapy services during remainder of CIR stay.    Intensity: Minumum of 1-2 x/day, 30 to 90 minutes Frequency: 3 to 5 out of 7 days Duration/Length of Stay: 10/29 Treatment/Interventions: Cognitive remediation/compensation;Environmental controls;Multimodal communication approach;Cueing hierarchy;Functional tasks;Therapeutic Activities;Internal/external aids;Therapeutic Exercise;Oral motor exercises;Dysphagia/aspiration precaution training;Patient/family education   Daily Session  Skilled Therapeutic Interventions: SLP conducted skilled therapy session targeting dysphagia management goals. Guided patient through various jaw stretching exercises and assessed patient's tolerance of regular solid crackers. Patient exhibited no overt difficulty across trials. Patient continues to demonstrate improved jaw range of motion, though mandibular ROM is still limited on the left side due to muscle tightness. Given patient's tolerance of regular crackers and awareness of swallowing capabilities, upgraded diet to Dysphagia 3/thin liquid diet with patient planning to order appropriate items daily. SLP and patient discussed progress made thus far as well as anticipated further progress, outpatient speech therapy to target jaw tension, and plans for upcoming discharge. Patient agreeable to all information discussed. Uncharacteristically, patient requested to transfer to  bed from wheelchair but impulsively moved to the bed independently while therapist was getting gait belt from end of bed despite verbal cues to wait and without locking wheelchair. SLP educated patient on importance of following safety protocols. Patient was left in lowered bed with call bell in reach and bed alarm set. SLP will continue to target goals per plan of care.      Pain Pain Assessment Pain Scale: 0-10 Pain Score: 0-No pain  Therapy/Group:  Individual Therapy  Jeannie Done, M.A., CCC-SLP  Yetta Barre 11/08/2022, 3:35 PM

## 2022-11-08 NOTE — Progress Notes (Signed)
Physical Therapy Session Note  Patient Details  Name: Joan Robinson MRN: [id number] Date of Birth: [birth year]  Today's Date: 11/08/2022 PT Individual Time: 0830-0912 PT Individual Time Calculation (min): 42 min   Short Term Goals: Week 1:  PT Short Term Goal 1 (Week 1): Pt will complete bed mobility with CGA PT Short Term Goal 1 - Progress (Week 1): Met PT Short Term Goal 2 (Week 1): Pt will complete bed<>chair transfers with CGA and LRAD PT Short Term Goal 2 - Progress (Week 1): Progressing toward goal PT Short Term Goal 3 (Week 1): Pt will ambulate 167ft with minA and LRAD PT Short Term Goal 3 - Progress (Week 1): Met Week 2:  PT Short Term Goal 1 (Week 2): Pt will complete bed<>chair transfers with CGA and LRAD PT Short Term Goal 2 (Week 2): Pt will ambulate 169ft with minA and LRAD PT Short Term Goal 3 (Week 2): Pt will initiate stair training  Skilled Therapeutic Interventions/Progress Updates:   Received pt sitting EOB taking meds with sister at bedside. Pt agreeable to PT treatment and denied any pain during session. Session with emphasis on functional mobility/transfers, generalized strengthening and endurance, and gait training. Donned shoes with min A and pt performed stand<>pivot transfer into WC without AD and CGA. Pt performed WC mobility >263ft x 2 trials using BUE and BLE and supervision to/from main therapy gym.   Stood in hallway without UE support and CGA and ambulated 138ft with R handrail and light L HHA with sister providing WC follow. MD arrived for morning rounds, then pt ambulated 64ft backwards with R handrail and heavy min L HHA - cues to shorten stride, keep toes pointed forward, and relax LUE. Returned to room and concluded session with pt sitting in [facility] with all needs within reach awaiting upcoming OT session.   Therapy Documentation Precautions:  Precautions Precautions: Fall Precaution Comments: 02, cortrak, indwelling cath, vision deficit,  ataxia Restrictions Weight Bearing Restrictions: No  Therapy/Group: Individual Therapy Marlana Salvage Zaunegger Blima Rich PT, DPT 11/08/2022, 7:16 AM

## 2022-11-08 NOTE — Progress Notes (Signed)
PROGRESS NOTE   Subjective/Complaints: Did great walking with therapy today! No new complaints Sister present Would like her daughter to be trained for wound care  ROS: +anxiety- improved. See HPI. Denies CP, SOB, abd pain, +vertigo- improved, decreased sensation to right hand, +decreased hearing from left ear, nausea improved, +orthostasis   Objective:   No results found. Recent Labs    11/06/22 0742  WBC 4.6  HGB 10.9*  HCT 32.8*  PLT 241    No results for input(s): "NA", "K", "CL", "CO2", "GLUCOSE", "BUN", "CREATININE", "CALCIUM" in the last 72 hours.   No intake or output data in the 24 hours ending 11/08/22 1105         Physical Exam: Vital Signs Blood pressure 99/60, pulse 75, temperature (!) 97.5 F (36.4 C), resp. rate 18, height 5\' 6"  (1.676 m), weight 68.3 kg, SpO2 98%. Constitutional:      Comments: Working in therapy gym HENT:     Head: Normocephalic and atraumatic.     Comments: L facial droop L eye will not close- lower eyelid droops, patched Cortrak d/ced    Right Ear: External ear normal.     Left Ear: External ear normal.     Nose: Nose normal. No congestion.     Mouth/Throat:     Mouth: Mucous membranes are dry.     Pharynx: Oropharynx is clear. No oropharyngeal exudate.      Eyes:     Comments: Left lower lid everted and erythematous-- patched Cardiovascular:     Rate and Rhythm: Normal rate and regular rhythm.     Heart sounds: Normal heart sounds. No murmur heard.    No gallop.  Pulmonary: CTAB in all lung fields, no w/r/r, no hypoxia or increased WOB, speaking in full sentences Abdominal:     General: Bowel sounds are normal.     Comments: Dressing over midline incision intact- just changed C/D/I +BS throughout although definitely hypoactive, soft, nonTTP, nondistended.  Psych: pleasant, agreeable and cooperative, less anxious than prior weekends  PRIOR EXAMS: Neuro: L  facial droop, decreased hearing left ear. Alert, some memory deficits, decreased sensation to right side of face and right hand, +vertebrobasilar insufficiency Musculoskeletal:     Cervical back: Neck supple. No tenderness.     Comments: 5/5 strength in upper extremities and 5/5 in lower extremities  Skin:    General: Skin is warm and dry.     Comments: IV L AC fossa and R forearm- look OK      Assessment/Plan: 1. Functional deficits which require 3+ hours per day of interdisciplinary therapy in a comprehensive inpatient rehab setting. Physiatrist is providing close team supervision and 24 hour management of active medical problems listed below. Physiatrist and rehab team continue to assess barriers to discharge/monitor patient progress toward functional and medical goals  Care Tool:  Bathing    Body parts bathed by patient: Right arm, Left arm, Chest, Abdomen, Front perineal area, Buttocks, Right upper leg, Left upper leg, Right lower leg, Face, Left lower leg   Body parts bathed by helper: Right arm, Left lower leg, Right lower leg, Buttocks, Front perineal area, Right upper leg, Left upper leg  Bathing assist Assist Level: Contact Guard/Touching assist     Upper Body Dressing/Undressing Upper body dressing   What is the patient wearing?: Pull over shirt    Upper body assist Assist Level: Minimal Assistance - Patient > 75%    Lower Body Dressing/Undressing Lower body dressing      What is the patient wearing?: Pants, Underwear/pull up     Lower body assist Assist for lower body dressing: Contact Guard/Touching assist     Toileting Toileting Toileting Activity did not occur (Clothing management and hygiene only): N/A (no void or bm)  Toileting assist Assist for toileting: Minimal Assistance - Patient > 75%     Transfers Chair/bed transfer  Transfers assist  Chair/bed transfer activity did not occur: Safety/medical concerns  Chair/bed transfer assist level:  Contact Guard/Touching assist     Locomotion Ambulation   Ambulation assist      Assist level: 2 helpers Assistive device:  (R handrail) Max distance: 131ft   Walk 10 feet activity   Assist     Assist level: 2 helpers Assistive device: Other (comment) (R handrail)   Walk 50 feet activity   Assist Walk 50 feet with 2 turns activity did not occur: Safety/medical concerns (fatigue)  Assist level: 2 helpers Assistive device: Other (comment) (R handrial)    Walk 150 feet activity   Assist Walk 150 feet activity did not occur: Safety/medical concerns         Walk 10 feet on uneven surface  activity   Assist Walk 10 feet on uneven surfaces activity did not occur: Safety/medical concerns         Wheelchair     Assist Is the patient using a wheelchair?: Yes Type of Wheelchair: Manual    Wheelchair assist level: Supervision/Verbal cueing Max wheelchair distance: 259ft    Wheelchair 50 feet with 2 turns activity    Assist        Assist Level: Supervision/Verbal cueing   Wheelchair 150 feet activity     Assist      Assist Level: Supervision/Verbal cueing   Blood pressure 99/60, pulse 75, temperature (!) 97.5 F (36.4 C), resp. rate 18, height 5\' 6"  (1.676 m), weight 68.3 kg, SpO2 98%.  Medical Problem List and Plan: 1. Functional deficits secondary to L PICA/AICA stroke with cerebellar edema             -patient may  shower- if cover incisions             -ELOS/Goals: 10-12 days supervision             -Continue CIR  Head CT reviewed with patient 10/14 and improvements discussed   Disability paperwork given to [facility] for completion 2.  Antithrombotics: -DVT/anticoagulation:  Pharmaceutical: Heparin 5000U q8h             -antiplatelet therapy: Plavix 75mg  daily   3. Chronic back pain: kpad ordered. Tylenol as needed             -continue Norco 5/325 one tablet q HS             Robaxin d/ced due to hypotension   4. Anxiety:  lexapro restarted daily at 10mg               -continue trazodone 50 mg q HS-- timing changed to 8pm -continue melatonin 10 mg q HS-- d/c'd but unclear why, not sleeping well, reordered on 10/27/22             -antipsychotic  agents: n/a   5. Neuropsych/cognition: This patient is? capable of making decisions on her (since sedated, cannot tell) own behalf.   6. Skin/Wound Care: Routine skin care checks             -continue packing of lower aspect of midline incision BID   7. 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 Cliffton Asters  -placed nursing order for daughter to be educated regarding wound care and allowed to perform dressing changes when she is available   11: Hypothyroidism: continue Synthroid daily   12: GERD: continue Protonix 40mg  daily   13: History of esophageal stricture s/p dilation   14: History of sleep apnea: no CPAP   15: Left eyelid palsy/irritation: continue patch             -continue Lacrilube, artificial tears   16: Superficial mid-line incisional infection drained at bedside             -wet to dry dressings BID  17. Urinary retention: placed order for foley removal 10/27/22-- +UCx on 10/14, completed 3 days of Bactrim given  -10/27/22 foley ordered to be removed today, ordered PVRs to monitor -10/28/22 foley removed, one cath overnight for but otherwise low PVRs--monitor -11/04/22 doing well, resolved.  18. Leukocytosis: WBC reviewed 10/14 and has resolved  19. Nausea: seems to be mostly with  movement/transitions, has zofran ordered but will trial meclizine to see if this helps with the movement aspect (pt doesn't state she's dizzy but how she describes her symptoms seems to have a rotational/movement component); started with 12.5mg  TID PRN, could always go up also.  -schedule zofran IV daily at 7am, discussed this helped, continue, messaged nursing regarding last BM since Zofran can be constipating.   20. Vertigo: d/c scopolamine patch, d/c meclizine since not helpful, discussed 2/2 vertebrobasilar insufficiency. Thigh high teds added for orthostasis  21. Lethargy: much improved, diet upgraded to D2/honey, BUN reviewed and improved, increase Vyvance to 70mg  daily  22. Decreased appetite: improved, d/c cortrak. Diet upgraded to D2/thins!  23. Decreased hearing left ear: discussed may be due to CN 8 being affected by stroke. Debrox ear drops ordered in case wax buildup is contributing  24. ADHD: Increase Vyvanse to 70mg  daily.   25. Constipation: d/c scheduled zofran since nausea has resolved -11/03/22 no BM since 10/28/22, pt very hesitant to taking stool softeners/laxatives due to prior diarrhea; discussed that constipation can lead to overflow diarrhea too so we really need to address the constipation; will start with miralax daily for now and see how things go.  -11/04/22 still no BM, pt now agreeable to be more aggressive with bowel regimen; didn't tolerate sorbitol (cramping) and CrCl too low for Mg Citrate, will try double dose of miralax this morning and ordered 17.2mg  senna daily-- hopefully this  helps. If pt starts feeling more lower abd/back pressure, would consider enema to help, but I think it won't be as helpful yet.  10/23: messaged nursing to confirm that last BM was 10/21  26. Oral irritation/burning: no definite thrush but nystatin started by Dr. Benjie Karvonen-- continue this, ordered magic mouthwash with lidocaine to help. Could be related to thickener but feel this is less  likely given that she's been using it for a while. No swelling or s/sx anaphylaxis. Monitor.   27. MASD: Gerrhard's butt cream ordered, changed to prn, continue  28. Visual deficits: encouraged outpatient follow-up with ophthalmologist, discussed correlation with type of stroke she had, discussed that left eye appears to be tracking better! LOS: 14 days A FACE TO FACE EVALUATION WAS PERFORMED  Crew Goren P Cleatus Goodin 11/08/2022, 11:05 AM

## 2022-11-08 NOTE — Progress Notes (Signed)
Physical Therapy Session Note  Patient Details  Name: Joan Robinson MRN: [id number] Date of Birth: 1960/07/07  Today's Date: 11/08/2022 PT Individual Time: 1330-1425 PT Individual Time Calculation (min): 55 min   Short Term Goals: Week 2:  PT Short Term Goal 1 (Week 2): Pt will complete bed<>chair transfers with CGA and LRAD PT Short Term Goal 2 (Week 2): Pt will ambulate 153ft with minA and LRAD PT Short Term Goal 3 (Week 2): Pt will initiate stair training  Skilled Therapeutic Interventions/Progress Updates:      Pt sitting EOB to start with her sister present. Pt's loaner wheelchair being delivered through [employer] with rep present. Assisted patient into loaner wheelchair with minA ambulatory transfer. Assessed patient in wheelchair to ensure adequate fit and positioning until her personal wheelchair gets delivered.   Pt transported in w/c to day room rehab gym. She was assisted onto the [facility] via stand step transfer with minA. Resistance set to 15 cm/sec to start. She completed 2x5 minutes at 15cm/sec and then 1x5 minutes at 30cm/sec. HR monitored and would raise from 95bpm to 115bpm with activity.   Pt instructed in w/c propulsion in her new loaner wheelchair - she was able to propel herself 169ft with supervision assist using BUE to propel. Still has decreased efficiency and some ataxia in LUE but improved control and ability to understand steering.   Setup in // bars and we worked a lot on static standing balance with eyes open feet apart on firm surface. Improvement in this as she's able to stand with suprevision (!) for both eyes open and eyes closed in this setup. However, with feet together eyes open on firm surface, patient unable to stand unsupported for >3 seconds without LOB or falling to the L.   Added red theraband resistance exercises in the // bars with patient standing on // bar with both feet and having the ends in both hands - unilateral and alternating shoulder  abd/add and unilateral and alternating bicep flex/ext - to challenge balance, endurance, and activity tolerance. CGA needed for safety.   Returned to her room and patient assisted back to bed with minA stand pivot transfer. Bed mobility completed supervision. All needs met at end, pt thankful for her care.   Therapy Documentation Precautions:  Precautions Precautions: Fall Precaution Comments: 02, cortrak, indwelling cath, vision deficit, ataxia Restrictions Weight Bearing Restrictions: No General:      Therapy/Group: Individual Therapy  Orrin Brigham 11/08/2022, 7:42 AM

## 2022-11-08 NOTE — Progress Notes (Signed)
Occupational Therapy Session Note  Patient Details  Name: ASHELYN CROMLEY MRN: [id number] Date of Birth: 01-17-1960  Today's Date: 11/08/2022 OT Individual Time: 4259-5638 OT Individual Time Calculation (min): 44 min    Short Term Goals: Week 2:  OT Short Term Goal 1 (Week 2): STG=LTG due to LOS  Skilled Therapeutic Interventions/Progress Updates:    Patient received seated in wheelchair agreeable to OT session.  Patient's sister present for session.  Patient opting to sponge bathe and change clothes and shower tomorrow.  Worked on grading movements for sit to stand and stand to sit at sink as boundary.  Patient stood to complete much of her grooming and bathing at sink.  Worked on reducing surface dependence with hips on sink or hands on sink, also slowing rate of movement for better control.  Alignment of head and trunk over base of support in static standing.  Patient able to stand without support x 3-5 seconds this session.  Patient reports mild burning with urination - informed MD.   Patient reports she has all necessary bathroom equipment at home.  Left up in wheelchair with sister at bedside.    Therapy Documentation Precautions:  Precautions Precautions: Fall Precaution Comments: 02, cortrak, indwelling cath, vision deficit, ataxia Restrictions Weight Bearing Restrictions: No   Pain: Pain Assessment Pain Scale: 0-10 Pain Score: 0-No pain    Therapy/Group: Individual Therapy  [name] 11/08/2022, 12:13 PM

## 2022-11-09 ENCOUNTER — Telehealth: Payer: Self-pay | Admitting: Physical Medicine and Rehabilitation

## 2022-11-09 DIAGNOSIS — I63532 Cerebral infarction due to unspecified occlusion or stenosis of left posterior cerebral artery: Secondary | ICD-10-CM

## 2022-11-09 MED ORDER — SULFAMETHOXAZOLE-TRIMETHOPRIM 800-160 MG PO TABS
1.0000 | ORAL_TABLET | Freq: Two times a day (BID) | ORAL | Status: AC
  Administered 2022-11-09 – 2022-11-11 (×6): 1 via ORAL
  Filled 2022-11-09 (×6): qty 1

## 2022-11-09 MED ORDER — DOCUSATE SODIUM 100 MG PO CAPS
100.0000 mg | ORAL_CAPSULE | Freq: Every day | ORAL | Status: DC
  Administered 2022-11-09 – 2022-11-11 (×3): 100 mg via ORAL
  Filled 2022-11-09 (×4): qty 1

## 2022-11-09 MED ORDER — TRAZODONE HCL 50 MG PO TABS
75.0000 mg | ORAL_TABLET | Freq: Every day | ORAL | Status: DC
  Administered 2022-11-09 – 2022-11-12 (×4): 75 mg via ORAL
  Filled 2022-11-09 (×4): qty 2

## 2022-11-09 NOTE — Progress Notes (Signed)
Occupational Therapy Session Note  Patient Details  Name: Joan Robinson MRN: [id number] Date of Birth: 1960-05-24  Today's Date: 11/09/2022 OT Individual Time: 0920-1015 OT Individual Time Calculation (min): 55 min    Short Term Goals: Week 2:  OT Short Term Goal 1 (Week 2): STG=LTG due to LOS  Skilled Therapeutic Interventions/Progress Updates:    Patient received seated in wheelchair.  Patient's daughter report "she just got strangled"  Apparently patient had difficulty after swallowing mouthwash.  Patient agreeable to shower.  Patient transferring from one surface to another without assistance!  Patient's movement still ataxic, but speed is slower and safer.  Patient maneuvering wheelchair around in her room.  Still needing cueing to anticipate left visual loss.  In new environment - patient frequently clips left side of wheelchair.    Patient completed grooming and dressing while seated at sink.  Patient standing then able to use BUE to manage clothing.  Balancing without use of Ue's!  Working with daughter to reduce her verbal cueing as this seems to irritate patient, and patient needs to process functional tasks without cueing.   Patient opted to get back in bed for brief rest before PT session.  Transferred with supervision!    Therapy Documentation Precautions:  Precautions Precautions: Fall Precaution Comments: 02, cortrak, indwelling cath, vision deficit, ataxia Restrictions Weight Bearing Restrictions: No   Pain: Pain Assessment Pain Scale: 0-10 Pain Score: 3  Pain Location: Abdomen     Therapy/Group: Individual Therapy  [name] 11/09/2022, 12:48 PM

## 2022-11-09 NOTE — Progress Notes (Signed)
Patient with urine culture growing >100K of proteus mirablis and only 20K colonies of enterococcus (likely insignificant) on 10/14. Patient previously received 3 days of bactrim. Patient with documented intollerance to bactrim (Nausea, palpitations). MD and patient want to retry bactrim today for new UTI symptoms. Will redose bactrim 1 DS BID for 3 days.   Kaytlen Lightsey A. Jeanella Craze, PharmD, BCPS, FNKF Clinical Pharmacist [place] Please utilize Amion for appropriate phone number to reach the unit pharmacist [facility])

## 2022-11-09 NOTE — Progress Notes (Signed)
PROGRESS NOTE   Subjective/Complaints: Dysuria worse today- Bactrim started and discussed with patient and daughter Visual tracking much improved! Steri-strips removed for therapy  ROS: +anxiety- improved. See HPI. Denies CP, SOB, abd pain, +vertigo- improved, decreased sensation to right hand, +decreased hearing from left ear, nausea improved, +orthostasis, improved visual tracking   Objective:   No results found. No results for input(s): "WBC", "HGB", "HCT", "PLT" in the last 72 hours.   No results for input(s): "NA", "K", "CL", "CO2", "GLUCOSE", "BUN", "CREATININE", "CALCIUM" in the last 72 hours.    Intake/Output Summary (Last 24 hours) at 11/09/2022 1307 Last data filed at 11/09/2022 1914 Gross per 24 hour  Intake 360 ml  Output 40 ml  Net 320 ml           Physical Exam: Vital Signs Blood pressure 94/63, pulse 78, temperature 98.2 F (36.8 C), temperature source Oral, resp. rate 18, height 5\' 6"  (1.676 m), weight 68.3 kg, SpO2 100%. Constitutional:      Comments: Working in therapy gym HENT:     Head: Normocephalic and atraumatic.     Comments: L facial droop L eye will not close- lower eyelid droops, patched Cortrak d/ced    Right Ear: External ear normal.     Left Ear: External ear normal.     Nose: Nose normal. No congestion.     Mouth/Throat:     Mouth: Mucous membranes are dry.     Pharynx: Oropharynx is clear. No oropharyngeal exudate.      Eyes:     Comments: Left lower lid everted and erythematous-- patched Cardiovascular:     Rate and Rhythm: Normal rate and regular rhythm.     Heart sounds: Normal heart sounds. No murmur heard.    No gallop.  Pulmonary: CTAB in all lung fields, no w/r/r, no hypoxia or increased WOB, speaking in full sentences Abdominal:     General: Bowel sounds are normal.     Comments: Dressing over midline incision intact- just changed C/D/I +BS throughout although  definitely hypoactive, soft, nonTTP, nondistended.  Psych: pleasant, agreeable and cooperative, less anxious than prior weekends Neuro: L facial droop, decreased hearing left ear. Alert, some memory deficits, decreased sensation to right side of face and right hand, +vertebrobasilar insufficiency Musculoskeletal:     Cervical back: Neck supple. No tenderness.     Comments: 5/5 strength in upper extremities and 5/5 in lower extremities , stable 10/25  Skin:    General: Skin is warm and dry.     Comments: IV L AC fossa and R forearm- look OK      Assessment/Plan: 1. Functional deficits which require 3+ hours per day of interdisciplinary therapy in a comprehensive inpatient rehab setting. Physiatrist is providing close team supervision and 24 hour management of active medical problems listed below. Physiatrist and rehab team continue to assess barriers to discharge/monitor patient progress toward functional and medical goals  Care Tool:  Bathing    Body parts bathed by patient: Right arm, Left arm, Chest, Abdomen, Front perineal area, Buttocks, Right upper leg, Left upper leg, Right lower leg, Face, Left lower leg   Body parts bathed by helper: Right arm,  Left lower leg, Right lower leg, Buttocks, Front perineal area, Right upper leg, Left upper leg     Bathing assist Assist Level: Contact Guard/Touching assist     Upper Body Dressing/Undressing Upper body dressing   What is the patient wearing?: Pull over shirt    Upper body assist Assist Level: Minimal Assistance - Patient > 75%    Lower Body Dressing/Undressing Lower body dressing      What is the patient wearing?: Pants, Underwear/pull up     Lower body assist Assist for lower body dressing: Contact Guard/Touching assist     Toileting Toileting Toileting Activity did not occur (Clothing management and hygiene only): N/A (no void or bm)  Toileting assist Assist for toileting: Minimal Assistance - Patient > 75%      Transfers Chair/bed transfer  Transfers assist  Chair/bed transfer activity did not occur: Safety/medical concerns  Chair/bed transfer assist level: Contact Guard/Touching assist     Locomotion Ambulation   Ambulation assist      Assist level: 2 helpers Assistive device:  (R handrail) Max distance: 174ft   Walk 10 feet activity   Assist     Assist level: 2 helpers Assistive device: Other (comment) (R handrail)   Walk 50 feet activity   Assist Walk 50 feet with 2 turns activity did not occur: Safety/medical concerns (fatigue)  Assist level: 2 helpers Assistive device: Other (comment) (R handrial)    Walk 150 feet activity   Assist Walk 150 feet activity did not occur: Safety/medical concerns         Walk 10 feet on uneven surface  activity   Assist Walk 10 feet on uneven surfaces activity did not occur: Safety/medical concerns         Wheelchair     Assist Is the patient using a wheelchair?: Yes Type of Wheelchair: Manual    Wheelchair assist level: Supervision/Verbal cueing Max wheelchair distance: 269ft    Wheelchair 50 feet with 2 turns activity    Assist        Assist Level: Supervision/Verbal cueing   Wheelchair 150 feet activity     Assist      Assist Level: Supervision/Verbal cueing   Blood pressure 94/63, pulse 78, temperature 98.2 F (36.8 C), temperature source Oral, resp. rate 18, height 5\' 6"  (1.676 m), weight 68.3 kg, SpO2 100%.  Medical Problem List and Plan: 1. Functional deficits secondary to L PICA/AICA stroke with cerebellar edema             -patient may  shower- if cover incisions             -ELOS/Goals: 10-12 days supervision             -Continue CIR  Head CT reviewed with patient 10/14 and improvements discussed   Disability paperwork given to [employer] for completion 2.  Antithrombotics: -DVT/anticoagulation:  Pharmaceutical: Heparin 5000U q8h             -antiplatelet therapy: Plavix 75mg   daily   3. Chronic back pain: kpad ordered. Tylenol as needed             -continue Norco 5/325 one tablet q HS             Robaxin d/ced due to hypotension   4. Anxiety: lexapro restarted daily at 10mg               -continue trazodone 50 mg q HS-- timing changed to 8pm -continue melatonin 10 mg q HS-- d/c'd  but unclear why, not sleeping well, reordered on 10/27/22             -antipsychotic agents: n/a   5. Neuropsych/cognition: This patient is? capable of making decisions on her (since sedated, cannot tell) own behalf.   6. Skin/Wound Care: Routine skin care checks             -continue packing of lower aspect of midline incision BID   7. 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 Cliffton Asters  -placed nursing order for daughter to be educated regarding wound care and allowed to perform dressing changes when she is available   11: Hypothyroidism: continue Synthroid daily   12: GERD: continue Protonix 40mg  daily   13: History of esophageal stricture s/p dilation   14: History of sleep apnea: no CPAP   15: Left eyelid palsy/irritation: continue patch             -continue Lacrilube, artificial tears   16: Superficial mid-line incisional infection drained at bedside             -wet to dry dressings BID  17. UTI: Bactrim started 10/25 for 3 day course  18. Leukocytosis: WBC reviewed 10/14 and has resolved  19. Nausea: seems to be mostly with movement/transitions, has zofran ordered but will trial meclizine to see if this helps with the  movement aspect (pt doesn't state she's dizzy but how she describes her symptoms seems to have a rotational/movement component); started with 12.5mg  TID PRN, could always go up also.  -schedule zofran IV daily at 7am, discussed this helped, continue, messaged nursing regarding last BM since Zofran can be constipating.   20. Vertigo: d/c scopolamine patch, d/c meclizine since not helpful, discussed 2/2 vertebrobasilar insufficiency. Thigh high teds added for orthostasis  21. Lethargy: much improved, diet upgraded to D2/honey, BUN reviewed and improved, increase Vyvance to 70mg  daily  22. Decreased appetite: improved, d/c cortrak. Diet upgraded to D2/thins!  23. Decreased hearing left ear: discussed may be due to CN 8 being affected by stroke. Debrox ear drops ordered in case wax buildup is contributing  24. ADHD: Increase Vyvanse to 70mg  daily.   25. Constipation: d/c scheduled zofran since nausea has resolved. Colace added  26. Oral irritation/burning: no definite thrush but nystatin started by Dr. Benjie Karvonen-- continue this, ordered magic mouthwash with lidocaine to help. Could be related to thickener but feel this is less likely given that she's been using it for a while. No swelling or s/sx anaphylaxis. Monitor.   27. MASD: Gerrhard's butt cream ordered, changed to prn, continue  28. Visual deficits: encouraged outpatient follow-up with ophthalmologist, discussed correlation with type of stroke she had, discussed that left eye appears to be tracking better! Discussed with therapy that steristrips may be removed for therapy  29. Insomnia:  increase trazodone to 75mg  HS LOS: 15 days A FACE TO FACE EVALUATION WAS PERFORMED  Mikal Blasdell P Myrtie Leuthold 11/09/2022, 1:07 PM

## 2022-11-09 NOTE — Plan of Care (Signed)
  Problem: Consults Goal: RH STROKE PATIENT EDUCATION Description: See Patient Education module for education specifics  Outcome: Progressing   Problem: RH BOWEL ELIMINATION Goal: RH STG MANAGE BOWEL WITH ASSISTANCE Description: STG Manage Bowel with mod I Assistance. Outcome: Progressing   Problem: RH BOWEL ELIMINATION Goal: RH STG MANAGE BOWEL W/MEDICATION W/ASSISTANCE Description: STG Manage Bowel with Medication with mod I  Assistance. Outcome: Progressing   Problem: RH BLADDER ELIMINATION Goal: RH STG MANAGE BLADDER WITH ASSISTANCE Description: STG Manage Bladder With toileting Assistance Outcome: Progressing   Problem: RH BLADDER ELIMINATION Goal: RH STG MANAGE BLADDER WITH MEDICATION WITH ASSISTANCE Description: STG Manage Bladder With Medication With mod I Assistance. Outcome: Progressing   Problem: RH SAFETY Goal: RH STG ADHERE TO SAFETY PRECAUTIONS W/ASSISTANCE/DEVICE Description: STG Adhere to Safety Precautions With cues  Assistance/Device. Outcome: Progressing

## 2022-11-09 NOTE — Progress Notes (Signed)
Physical Therapy Weekly Progress Note  Patient Details  Name: Joan Robinson MRN: [id number] Date of Birth: 09-Aug-1960  Beginning of progress report period: November 02, 2022 End of progress report period: November 09, 2022  Today's Date: 11/09/2022 PT Individual Time: 1045-1200 PT Individual Time Calculation (min): 75 min   Patient has met 3 of 3 short term goals. Pt is making great progress towards LTG of CGA to supervision. Custom wheelchair evaluation completed and loaner chair delivered. Family education has been ongoing with her daughter (primary caregiver) Judeth Cornfield since admission. Pt is primarily limited by L sided ataxia with sensory deficits, impaired proprioception, impaired dynamic standing balance, and decreased activity tolerance.  Patient continues to demonstrate the following deficits muscle weakness, decreased cardiorespiratoy endurance, unbalanced muscle activation, motor apraxia, and decreased coordination, decreased visual acuity, decreased visual perceptual skills, and decreased visual motor skills, and decreased standing balance, decreased postural control, hemiplegia, and decreased balance strategies and therefore will continue to benefit from skilled PT intervention to increase functional independence with mobility.  Patient progressing toward long term goals..  Continue plan of care.  PT Short Term Goals Week 3:  PT Short Term Goal 1 (Week 3): STG = LTTG  Skilled Therapeutic Interventions/Progress Updates:      Pt lying in bed to start. No reports of pain. Her L eye is partially taped - let LPN know so that it can be re-taped for full closure.  Bed mobility completed mod I. Sit<>Stand and stand pivot transfer completed with CGA to her wheelchair.  Transported to main rehab gym for energy conservation. Instructed in gait training with no AD ~110ft with heavy minA due to L trunk lean and ataxia. Challenged gait with backward stepping and lateral stepping L<>R ~5ft,  modA needed 2/2 ataxia.   Assisted her onto mat table in quadruped position with CGA. Able to sustain quadruped position with CGA. Worked on Retail buyer with bird dogs, progressing from LE only to UE/LE combination. Increased difficulty when lifting LUE/RLE while weight bearing through her L hip/knee.   Pt assisted to sitting position on large therapy ball with +2 assist for stabilizing therapy ball for safety. Worked on trunk control/ataxia in this position with supported/unsupported sitting - persistent L lean while sitting on therapy ball and frequent repositioning needed due to sliding as PT guarding in in the front and rehab tech stabilizing therapy ball in the back.   Worked more on balance in the // bars on firm surface: -feet apart eyes open - can stand unsupported with supervision for > 30 seconds -feet together eyes open - Unable to stand unsupported for >3 seconds without LOB to the L -semi tandem stance eyes open - needs minA for static standing  Pt then assisted on Kinetron with minA stand pivot transfer. She completed x10 minutes at L60cm/sec resistance, emphasis on cadence, coordination, and full ROM bilaterally.   Returned to her room and patient assisted back to bed 2/2 fatigue. All needs met at end of treatment.    Therapy Documentation Precautions:  Precautions Precautions: Fall Precaution Comments: 02, cortrak, indwelling cath, vision deficit, ataxia Restrictions Weight Bearing Restrictions: No General:     Therapy/Group: Individual Therapy  Archer Vise P Chanda Laperle  PT, DPT, CSRS  11/09/2022, 7:49 AM

## 2022-11-09 NOTE — Progress Notes (Signed)
Speech Language Pathology Daily Session Note  Patient Details  Name: Joan Robinson MRN: [id number] Date of Birth: 1960/12/08  Today's Date: 11/09/2022 SLP Individual Time: 0801-0900 SLP Individual Time Calculation (min): 59 min  Short Term Goals: Week 3: SLP Short Term Goal 1 (Week 3): STGs=LTGs d/t ELOS  Skilled Therapeutic Interventions: SLP conducted skilled therapy session targeting dysphagia management and speech therapy goals. Patient improving well across all speech therapy goal target areas, approaching goal level as discharge date approaches. Patient continues to tolerate Dys3/thin liquid diet and makes appropriate daily choices re: what she is able to tolerate orally. SLP guided patient through jaw stretches with patient's trismus improving slowly. During speech tasks, patient utilizing speech intelligibility increasing strategies with supervision-min assist at the conversation level. Patient recalled daily events with 100% accuracy independently and has approached cognitive baseline. Patient was left in lowered bed with call bell in reach and bed alarm set. SLP will continue to target goals per plan of care.       Pain Pain Assessment Pain Scale: 0-10 Pain Score: 3  Pain Location: Abdomen  Therapy/Group: Individual Therapy  Jeannie Done, M.A., CCC-SLP  Yetta Barre 11/09/2022, 8:53 AM

## 2022-11-09 NOTE — Progress Notes (Signed)
Patient ID: Joan Robinson, female   DOB: March 03, 1960, 62 y.o.   MRN: [id number]  Gave handicapped application to daughter so she can get the placards for their cars. Has all DME for discharge.

## 2022-11-10 DIAGNOSIS — R109 Unspecified abdominal pain: Secondary | ICD-10-CM

## 2022-11-10 MED ORDER — CARBAMIDE PEROXIDE 6.5 % OT SOLN
5.0000 [drp] | OTIC | Status: DC | PRN
Start: 2022-11-10 — End: 2022-11-13

## 2022-11-10 MED ORDER — NYSTATIN 100000 UNIT/ML MT SUSP: 5.0000 mL | OROMUCOSAL | Status: DC | PRN

## 2022-11-10 MED ORDER — NYSTATIN 100000 UNIT/ML MT SUSP: 5.0000 mL | Freq: Four times a day (QID) | OROMUCOSAL | Status: DC | PRN

## 2022-11-10 NOTE — Progress Notes (Signed)
PROGRESS NOTE   Subjective/Complaints:  Pt doing well, slept so-so but that's kind of normal for her. Pain well controlled. LBM Tuesday 4 days ago but accepting of stool softeners now. Does have some abd cramping at times. Urinating fine. Denies any other complaints or concerns today.   ROS: +anxiety- improved. See HPI. Denies CP, SOB, abd pain, +vertigo- improved, decreased sensation to right hand, +decreased hearing from left ear, nausea improved, +orthostasis, improved visual tracking   Objective:   No results found. No results for input(s): "WBC", "HGB", "HCT", "PLT" in the last 72 hours.   No results for input(s): "NA", "K", "CL", "CO2", "GLUCOSE", "BUN", "CREATININE", "CALCIUM" in the last 72 hours.    Intake/Output Summary (Last 24 hours) at 11/10/2022 1215 Last data filed at 11/10/2022 0721 Gross per 24 hour  Intake 460 ml  Output --  Net 460 ml           Physical Exam: Vital Signs Blood pressure (!) 93/58, pulse 65, temperature 98.1 F (36.7 C), temperature source Oral, resp. rate 18, height 5\' 6"  (1.676 m), weight 68.3 kg, SpO2 97%. Constitutional:      Comments: Working in therapy gym HENT:     Head: Normocephalic and atraumatic.     Comments: L facial droop L eye will not close- lower eyelid droops, patched Cortrak d/ced!    Right Ear: External ear normal.     Left Ear: External ear normal.     Nose: Nose normal. No congestion.     Mouth/Throat:     Mouth: Mucous membranes are dry.     Pharynx: Oropharynx is clear. No oropharyngeal exudate.      Eyes:     Comments: Left lower lid everted and erythematous-- patched Cardiovascular:     Rate and Rhythm: Normal rate and regular rhythm.     Heart sounds: Normal heart sounds. No murmur heard.    No gallop.  Pulmonary: CTAB in all lung fields, no w/r/r, no hypoxia or increased WOB, speaking in full sentences Abdominal:     General: Bowel sounds  are normal.     Comments: Dressing over midline incision intact- C/D/I +BS throughout although definitely hypoactive, soft, nonTTP, nondistended.  Psych: pleasant, agreeable and cooperative, a little anxious but overall better   PRIOR EXAMS: Neuro: L facial droop, decreased hearing left ear. Alert, some memory deficits, decreased sensation to right side of face and right hand, +vertebrobasilar insufficiency Musculoskeletal:     Cervical back: Neck supple. No tenderness.     Comments: 5/5 strength in upper extremities and 5/5 in lower extremities , stable 10/25  Skin:    General: Skin is warm and dry.     Comments: IV L AC fossa and R forearm- look OK      Assessment/Plan: 1. Functional deficits which require 3+ hours per day of interdisciplinary therapy in a comprehensive inpatient rehab setting. Physiatrist is providing close team supervision and 24 hour management of active medical problems listed below. Physiatrist and rehab team continue to assess barriers to discharge/monitor patient progress toward functional and medical goals  Care Tool:  Bathing    Body parts bathed by patient: Right arm, Left arm,  Chest, Abdomen, Front perineal area, Buttocks, Right upper leg, Left upper leg, Right lower leg, Face, Left lower leg   Body parts bathed by helper: Right arm, Left lower leg, Right lower leg, Buttocks, Front perineal area, Right upper leg, Left upper leg     Bathing assist Assist Level: Contact Guard/Touching assist     Upper Body Dressing/Undressing Upper body dressing   What is the patient wearing?: Pull over shirt    Upper body assist Assist Level: Minimal Assistance - Patient > 75%    Lower Body Dressing/Undressing Lower body dressing      What is the patient wearing?: Pants, Underwear/pull up     Lower body assist Assist for lower body dressing: Contact Guard/Touching assist     Toileting Toileting Toileting Activity did not occur (Clothing management and  hygiene only): N/A (no void or bm)  Toileting assist Assist for toileting: Minimal Assistance - Patient > 75%     Transfers Chair/bed transfer  Transfers assist  Chair/bed transfer activity did not occur: Safety/medical concerns  Chair/bed transfer assist level: Contact Guard/Touching assist     Locomotion Ambulation   Ambulation assist      Assist level: 2 helpers Assistive device:  (R handrail) Max distance: 122ft   Walk 10 feet activity   Assist     Assist level: 2 helpers Assistive device: Other (comment) (R handrail)   Walk 50 feet activity   Assist Walk 50 feet with 2 turns activity did not occur: Safety/medical concerns (fatigue)  Assist level: 2 helpers Assistive device: Other (comment) (R handrial)    Walk 150 feet activity   Assist Walk 150 feet activity did not occur: Safety/medical concerns         Walk 10 feet on uneven surface  activity   Assist Walk 10 feet on uneven surfaces activity did not occur: Safety/medical concerns         Wheelchair     Assist Is the patient using a wheelchair?: Yes Type of Wheelchair: Manual    Wheelchair assist level: Supervision/Verbal cueing Max wheelchair distance: 212ft    Wheelchair 50 feet with 2 turns activity    Assist        Assist Level: Supervision/Verbal cueing   Wheelchair 150 feet activity     Assist      Assist Level: Supervision/Verbal cueing   Blood pressure (!) 93/58, pulse 65, temperature 98.1 F (36.7 C), temperature source Oral, resp. rate 18, height 5\' 6"  (1.676 m), weight 68.3 kg, SpO2 97%.  Medical Problem List and Plan: 1. Functional deficits secondary to L PICA/AICA stroke with cerebellar edema             -patient may  shower- if cover incisions             -ELOS/Goals: 10-12 days supervision goal d/c date 10/29!             -Continue CIR  Head CT reviewed with patient 10/14 and improvements discussed   Disability paperwork given to [facility] for  completion 2.  Antithrombotics: -DVT/anticoagulation:  Pharmaceutical: Heparin 5000U q8h             -antiplatelet therapy: Plavix 75mg  daily   3. Chronic back pain: kpad ordered. Tylenol as needed             -continue Norco 5/325 one tablet q HS             Robaxin d/ced due to hypotension   4. Anxiety: lexapro restarted  daily at 10mg               -continue trazodone 50 mg q HS-- timing changed to 8pm -continue melatonin 10 mg q HS-- d/c'd but unclear why, not sleeping well, reordered on 10/27/22             -antipsychotic agents: n/a   5. Neuropsych/cognition: This patient is? capable of making decisions on her (since sedated, cannot tell) own behalf.   6. Skin/Wound Care: Routine skin care checks             -continue packing of lower aspect of midline incision BID   7. 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 Cliffton Asters  -placed nursing order for daughter to be educated regarding wound care and allowed to perform dressing changes when she is available   11: Hypothyroidism: continue Synthroid daily   12: GERD: continue Protonix 40mg  daily   13: History of esophageal stricture s/p dilation   14: History of sleep apnea: no CPAP   15: Left eyelid palsy/irritation: continue patch             -continue Lacrilube, artificial tears   16: Superficial mid-line incisional infection drained at bedside             -wet to dry dressings BID  17. UTI: Bactrim started 10/25 for 3 day course  18. Leukocytosis: WBC reviewed 10/14 and has  resolved  19. Nausea: seems to be mostly with movement/transitions, has zofran ordered but will trial meclizine to see if this helps with the movement aspect (pt doesn't state she's dizzy but how she describes her symptoms seems to have a rotational/movement component); started with 12.5mg  TID PRN, could always go up also.  -schedule zofran IV daily at 7am, discussed this helped, continue, messaged nursing regarding last BM since Zofran can be constipating.   20. Vertigo: d/c scopolamine patch, d/c meclizine since not helpful, discussed 2/2 vertebrobasilar insufficiency. Thigh high teds added for orthostasis  21. Lethargy: much improved, diet upgraded to D2/honey, BUN reviewed and improved, increase Vyvance to 70mg  daily  22. Decreased appetite: improved, d/c cortrak. Diet upgraded to D2/thins!  23. Decreased hearing left ear: discussed may be due to CN 8 being affected by stroke. Debrox ear drops ordered in case wax buildup is contributing  24. ADHD: Increase Vyvanse to 70mg  daily. Could be contributing to abd cramping and appetite issues-- monitor side effects.   25. Constipation: d/c scheduled zofran since nausea has resolved. Colace added  -11/10/22 no BM in 4 days but this is not unusual for her, cont current regimen but if no BM by tomorrow may consider additional meds. Vyvanse can contribute-- also could contribute to her abd cramping complaints  26. Oral irritation/burning: no definite thrush but nystatin started by Dr. Benjie Karvonen-- continue this, ordered  magic mouthwash with lidocaine to help. Could be related to thickener but feel this is less likely given that she's been using it for a while. No swelling or s/sx anaphylaxis. Monitor.   27. MASD: Gerrhard's butt cream ordered, changed to prn, continue  28. Visual deficits: encouraged outpatient follow-up with ophthalmologist, discussed correlation with type of stroke she had, discussed that left eye appears to be tracking better! Discussed  with therapy that steristrips may be removed for therapy  29. Insomnia: increase trazodone to 75mg  HS  I spent >59mins performing patient care related activities, including face to face time, documentation time, discussion of meds/BMs, and overall coordination of care.   LOS: 16 days A FACE TO FACE EVALUATION WAS PERFORMED  [address] Nola Botkins 11/10/2022, 12:15 PM

## 2022-11-10 NOTE — Progress Notes (Signed)
Speech Language Pathology Daily Session Note  Patient Details  Name: Joan Robinson MRN: [id number] Date of Birth: 01-28-1960  Today's Date: 11/10/2022 SLP Individual Time: 1300-1345 SLP Individual Time Calculation (min): 45 min  Short Term Goals: Week 3: SLP Short Term Goal 1 (Week 3): STGs=LTGs d/t ELOS  Skilled Therapeutic Interventions:Skilled intervention focused on speech intelligibility. Pt responded to questions with use of slow speech and overarticulation with min A. Speech was 90% intelligible with occasional moments of slurred speech .Encouraged patient to complete exercises daily for jaw . Pt encouraged to give herself breaks throughout the day with less talking. Cont therapy per plan of care.     Pain Pain Assessment Pain Scale: Faces Faces Pain Scale: No hurt  Therapy/Group: Individual Therapy  Joan Robinson 11/10/2022, 1:48 PM

## 2022-11-10 NOTE — Progress Notes (Signed)
Speech Language Pathology Daily Session Note  Patient Details  Name: PALESTINE GOVAN MRN: [id number] Date of Birth: [birth year]  Today's Date: 11/10/2022 SLP Individual Time: 7564-3329 SLP Individual Time Calculation (min): 45 min  Short Term Goals: Week 3: SLP Short Term Goal 1 (Week 3): STGs=LTGs d/t ELOS  Skilled Therapeutic Interventions: Skilled slp intervention focused on clear speech. She was 85% intelligible during session. She named items in a photo scene with use of slow rate and increased precision with min cues /visual models. Pt completed exercises x3 for 30 seconds for jaw strength and ROM. ST educated pt on ensuring food is clear from left buccal cavity after meals. Cont therapy per plan of care.      Pain Pain Assessment Pain Scale: Faces Faces Pain Scale: No hurt  Therapy/Group: Individual Therapy  Carlean Jews Sahily Biddle 11/10/2022, 2:44 PM

## 2022-11-10 NOTE — Progress Notes (Signed)
Occupational Therapy Session Note  Patient Details  Name: Joan Robinson MRN: [id number] Date of Birth: 1960/02/29  {CHL IP REHAB OT TIME CALCULATIONS:[id number]}   Short Term Goals: Week 2:  OT Short Term Goal 1 (Week 2): STG=LTG due to LOS  Skilled Therapeutic Interventions/Progress Updates:  Pt received *** for skilled OT session with focus on ***. Pt agreeable to interventions, demonstrating overall *** mood. Pt reported ***/10 pain, stating "***" in reference to ***. OT offering intermediate rest breaks and positioning suggestions throughout session to address pain/fatigue and maximize participation/safety in session.    Pt remained *** with all immediate needs met at end of session. Pt continues to be appropriate for skilled OT intervention to promote further functional independence.   Therapy Documentation Precautions:  Precautions Precautions: Fall Precaution Comments: 02, cortrak, indwelling cath, vision deficit, ataxia Restrictions Weight Bearing Restrictions: No   Therapy/Group: Individual Therapy  Lou Cal, OTR/L, MSOT  11/10/2022, 3:42 PM

## 2022-11-10 NOTE — Plan of Care (Signed)
  Problem: Ischemic Stroke/TIA Tissue Perfusion: Goal: Complications of ischemic stroke/TIA will be minimized Outcome: Progressing   Problem: Coping: Goal: Will verbalize positive feelings about self Outcome: Progressing Goal: Will identify appropriate support needs Outcome: Progressing   Problem: Health Behavior/Discharge Planning: Goal: Ability to manage health-related needs will improve Outcome: Progressing Goal: Goals will be collaboratively established with patient/family Outcome: Progressing   Problem: Self-Care: Goal: Ability to participate in self-care as condition permits will improve Outcome: Progressing Goal: Verbalization of feelings and concerns over difficulty with self-care will improve Outcome: Progressing Goal: Ability to communicate needs accurately will improve Outcome: Progressing   Problem: Nutrition: Goal: Risk of aspiration will decrease Outcome: Progressing Goal: Dietary intake will improve Outcome: Progressing   Problem: Ischemic Stroke/TIA Tissue Perfusion: Goal: Complications of ischemic stroke/TIA will be minimized Outcome: Progressing   Problem: Coping: Goal: Will verbalize positive feelings about self Outcome: Progressing   Problem: Coping: Goal: Will identify appropriate support needs Outcome: Progressing   Problem: Health Behavior/Discharge Planning: Goal: Ability to manage health-related needs will improve Outcome: Progressing   Problem: Health Behavior/Discharge Planning: Goal: Goals will be collaboratively established with patient/family Outcome: Progressing   Problem: Self-Care: Goal: Ability to participate in self-care as condition permits will improve Outcome: Progressing   Problem: Self-Care: Goal: Verbalization of feelings and concerns over difficulty with self-care will improve Outcome: Progressing   Problem: Self-Care: Goal: Ability to communicate needs accurately will improve Outcome: Progressing   Problem:  Nutrition: Goal: Risk of aspiration will decrease Outcome: Progressing

## 2022-11-11 NOTE — Plan of Care (Signed)
  Problem: Consults Goal: RH STROKE PATIENT EDUCATION Description: See Patient Education module for education specifics  Outcome: Progressing   Problem: RH BOWEL ELIMINATION Goal: RH STG MANAGE BOWEL WITH ASSISTANCE Description: STG Manage Bowel with mod I Assistance. Outcome: Progressing Goal: RH STG MANAGE BOWEL W/MEDICATION W/ASSISTANCE Description: STG Manage Bowel with Medication with mod I  Assistance. Outcome: Progressing   Problem: RH BLADDER ELIMINATION Goal: RH STG MANAGE BLADDER WITH ASSISTANCE Description: STG Manage Bladder With toileting Assistance Outcome: Progressing Goal: RH STG MANAGE BLADDER WITH MEDICATION WITH ASSISTANCE Description: STG Manage Bladder With Medication With mod I Assistance. Outcome: Progressing   Problem: RH SAFETY Goal: RH STG ADHERE TO SAFETY PRECAUTIONS W/ASSISTANCE/DEVICE Description: STG Adhere to Safety Precautions With cues  Assistance/Device. Outcome: Progressing   Problem: RH PAIN MANAGEMENT Goal: RH STG PAIN MANAGED AT OR BELOW PT'S PAIN GOAL Description: < 4 with prn Outcome: Progressing   Problem: RH KNOWLEDGE DEFICIT Goal: RH STG INCREASE KNOWLEDGE OF HYPERTENSION Description: Patient and spouse will be able to manage HTN using medications and dietary modifications using educational resources independently Outcome: Progressing Goal: RH STG INCREASE KNOWLEDGE OF DYSPHAGIA/FLUID INTAKE Description: Patient and spouse will be able to manage dysphagia, medications and dietary modifications using educational resources independently Outcome: Progressing Goal: RH STG INCREASE KNOWLEGDE OF HYPERLIPIDEMIA Description: Patient and spouse will be able to manage HLD using medications and dietary modifications using educational resources independently Outcome: Progressing Goal: RH STG INCREASE KNOWLEDGE OF STROKE PROPHYLAXIS Description: Patient and spouse will be able to manage secondary risks using medications and dietary modifications  using educational resources independently Outcome: Progressing   Problem: Education: Goal: Knowledge of disease or condition will improve Outcome: Progressing Goal: Knowledge of secondary prevention will improve (MUST DOCUMENT ALL) Outcome: Progressing Goal: Knowledge of patient specific risk factors will improve Joan Robinson N/A or DELETE if not current risk factor) Outcome: Progressing   Problem: RH Vision Goal: RH LTG Vision (Specify) Outcome: Progressing   Problem: Consults Goal: RH STROKE PATIENT EDUCATION Description: See Patient Education module for education specifics  Outcome: Progressing   Problem: RH BOWEL ELIMINATION Goal: RH STG MANAGE BOWEL WITH ASSISTANCE Description: STG Manage Bowel with mod I Assistance. Outcome: Progressing   Problem: RH BOWEL ELIMINATION Goal: RH STG MANAGE BOWEL W/MEDICATION W/ASSISTANCE Description: STG Manage Bowel with Medication with mod I  Assistance. Outcome: Progressing   Problem: RH BLADDER ELIMINATION Goal: RH STG MANAGE BLADDER WITH ASSISTANCE Description: STG Manage Bladder With toileting Assistance Outcome: Progressing   Problem: RH BLADDER ELIMINATION Goal: RH STG MANAGE BLADDER WITH MEDICATION WITH ASSISTANCE Description: STG Manage Bladder With Medication With mod I Assistance. Outcome: Progressing   Problem: RH SAFETY Goal: RH STG ADHERE TO SAFETY PRECAUTIONS W/ASSISTANCE/DEVICE Description: STG Adhere to Safety Precautions With cues  Assistance/Device. Outcome: Progressing

## 2022-11-11 NOTE — Progress Notes (Signed)
PROGRESS NOTE   Subjective/Complaints:  Pt doing well again today, slept ok. Pain well controlled. LBM last night! Urinating fine, dysuria improving. Denies any other complaints or concerns today.   ROS: +anxiety- improved. See HPI. Denies CP, SOB, abd pain, +vertigo- improved, decreased sensation to right hand, +decreased hearing from left ear, nausea improved, +orthostasis, improved visual tracking   Objective:   No results found. No results for input(s): "WBC", "HGB", "HCT", "PLT" in the last 72 hours.   No results for input(s): "NA", "K", "CL", "CO2", "GLUCOSE", "BUN", "CREATININE", "CALCIUM" in the last 72 hours.    Intake/Output Summary (Last 24 hours) at 11/11/2022 1128 Last data filed at 11/11/2022 0500 Gross per 24 hour  Intake 600 ml  Output --  Net 600 ml           Physical Exam: Vital Signs Blood pressure 96/67, pulse 91, temperature 97.6 F (36.4 C), temperature source Oral, resp. rate 17, height 5\' 6"  (1.676 m), weight 68.3 kg, SpO2 99%. Constitutional:      Comments: resting in bed, alert, just finished with SLP HENT:     Head: Normocephalic and atraumatic.     Comments: L facial droop L eye will not close- lower eyelid droops, patched Cortrak d/ced!    Right Ear: External ear normal.     Left Ear: External ear normal.     Nose: Nose normal. No congestion.     Mouth/Throat:     Mouth: Mucous membranes are dry.     Pharynx: Oropharynx is clear. No oropharyngeal exudate.      Eyes:     Comments: Left lower lid everted and erythematous-- patched Cardiovascular:     Rate and Rhythm: Normal rate and regular rhythm.     Heart sounds: Normal heart sounds. No murmur heard.    No gallop.  Pulmonary: CTAB in all lung fields, no w/r/r, no hypoxia or increased WOB, speaking in full sentences Abdominal:     General: Bowel sounds are normal.     Comments: Dressing over midline incision intact-  C/D/I +BS throughout although hypoactive, soft, nonTTP, nondistended.  Psych: pleasant, agreeable and cooperative, a little anxious but overall better   PRIOR EXAMS: Neuro: L facial droop, decreased hearing left ear. Alert, some memory deficits, decreased sensation to right side of face and right hand, +vertebrobasilar insufficiency Musculoskeletal:     Cervical back: Neck supple. No tenderness.     Comments: 5/5 strength in upper extremities and 5/5 in lower extremities , stable 10/25  Skin:    General: Skin is warm and dry.     Comments: IV L AC fossa and R forearm- look OK      Assessment/Plan: 1. Functional deficits which require 3+ hours per day of interdisciplinary therapy in a comprehensive inpatient rehab setting. Physiatrist is providing close team supervision and 24 hour management of active medical problems listed below. Physiatrist and rehab team continue to assess barriers to discharge/monitor patient progress toward functional and medical goals  Care Tool:  Bathing    Body parts bathed by patient: Right arm, Left arm, Chest, Abdomen, Front perineal area, Buttocks, Right upper leg, Left upper leg, Right lower leg, Face,  Left lower leg   Body parts bathed by helper: Right arm, Left lower leg, Right lower leg, Buttocks, Front perineal area, Right upper leg, Left upper leg     Bathing assist Assist Level: Contact Guard/Touching assist     Upper Body Dressing/Undressing Upper body dressing   What is the patient wearing?: Pull over shirt    Upper body assist Assist Level: Minimal Assistance - Patient > 75%    Lower Body Dressing/Undressing Lower body dressing      What is the patient wearing?: Pants, Underwear/pull up     Lower body assist Assist for lower body dressing: Contact Guard/Touching assist     Toileting Toileting Toileting Activity did not occur (Clothing management and hygiene only): N/A (no void or bm)  Toileting assist Assist for toileting:  Minimal Assistance - Patient > 75%     Transfers Chair/bed transfer  Transfers assist  Chair/bed transfer activity did not occur: Safety/medical concerns  Chair/bed transfer assist level: Contact Guard/Touching assist     Locomotion Ambulation   Ambulation assist      Assist level: 2 helpers Assistive device:  (R handrail) Max distance: 149ft   Walk 10 feet activity   Assist     Assist level: 2 helpers Assistive device: Other (comment) (R handrail)   Walk 50 feet activity   Assist Walk 50 feet with 2 turns activity did not occur: Safety/medical concerns (fatigue)  Assist level: 2 helpers Assistive device: Other (comment) (R handrial)    Walk 150 feet activity   Assist Walk 150 feet activity did not occur: Safety/medical concerns         Walk 10 feet on uneven surface  activity   Assist Walk 10 feet on uneven surfaces activity did not occur: Safety/medical concerns         Wheelchair     Assist Is the patient using a wheelchair?: Yes Type of Wheelchair: Manual    Wheelchair assist level: Supervision/Verbal cueing Max wheelchair distance: 244ft    Wheelchair 50 feet with 2 turns activity    Assist        Assist Level: Supervision/Verbal cueing   Wheelchair 150 feet activity     Assist      Assist Level: Supervision/Verbal cueing   Blood pressure 96/67, pulse 91, temperature 97.6 F (36.4 C), temperature source Oral, resp. rate 17, height 5\' 6"  (1.676 m), weight 68.3 kg, SpO2 99%.  Medical Problem List and Plan: 1. Functional deficits secondary to L PICA/AICA stroke with cerebellar edema             -patient may  shower- if cover incisions             -ELOS/Goals: 10-12 days supervision goal d/c date 10/29!             -Continue CIR  Head CT reviewed with patient 10/14 and improvements discussed   Disability paperwork given to [employer] for completion 2.  Antithrombotics: -DVT/anticoagulation:  Pharmaceutical:  Heparin 5000U q8h             -antiplatelet therapy: Plavix 75mg  daily   3. Chronic back pain: kpad ordered. Tylenol as needed             -continue Norco 5/325 one tablet q HS             Robaxin d/ced due to hypotension   4. Anxiety: lexapro restarted daily at 10mg               -continue  trazodone 50 mg q HS-- timing changed to 8pm -continue melatonin 10 mg q HS-- d/c'd but unclear why, not sleeping well, reordered on 10/27/22             -antipsychotic agents: n/a   5. Neuropsych/cognition: This patient is? capable of making decisions on her (since sedated, cannot tell) own behalf.   6. Skin/Wound Care: Routine skin care checks             -continue packing of lower aspect of midline incision BID   7. 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 Cliffton Asters  -placed nursing order for daughter to be educated regarding wound care and allowed to perform dressing changes when she is available   11: Hypothyroidism: continue Synthroid daily   12: GERD: continue Protonix 40mg  daily   13: History of esophageal stricture s/p dilation   14: History of sleep apnea: no CPAP   15: Left eyelid palsy/irritation: continue patch             -continue Lacrilube, artificial tears   16: Superficial mid-line incisional infection drained at bedside             -wet to dry dressings BID  17. UTI: Bactrim started 10/25 for 3 day course  18. Leukocytosis: WBC reviewed 10/14 and has resolved  19. Nausea: seems to be mostly with movement/transitions, has  zofran ordered but will trial meclizine to see if this helps with the movement aspect (pt doesn't state she's dizzy but how she describes her symptoms seems to have a rotational/movement component); started with 12.5mg  TID PRN, could always go up also.  -schedule zofran IV daily at 7am, discussed this helped, continue, messaged nursing regarding last BM since Zofran can be constipating.   20. Vertigo: d/c scopolamine patch, d/c meclizine since not helpful, discussed 2/2 vertebrobasilar insufficiency. Thigh high teds added for orthostasis  21. Lethargy: much improved, diet upgraded to D2/honey, BUN reviewed and improved, increase Vyvance to 70mg  daily  22. Decreased appetite: improved, d/c cortrak. Diet upgraded to D2/thins!  23. Decreased hearing left ear: discussed may be due to CN 8 being affected by stroke. Debrox ear drops ordered in case wax buildup is contributing  24. ADHD: Increase Vyvanse to 70mg  daily. Could be contributing to abd cramping and appetite issues-- monitor side effects.   25. Constipation: d/c scheduled zofran since nausea has resolved. Colace added  -11/10/22 no BM in 4 days but this is not unusual for her, cont current regimen but if no BM by tomorrow may consider additional meds. Vyvanse can contribute-- also could contribute to her abd cramping complaints  -11/11/22 LBM last night! Cont regimen  26. Oral irritation/burning: no definite thrush but nystatin started by Dr. Benjie Karvonen-- continue this, ordered magic mouthwash with lidocaine to help. Could be related to  thickener but feel this is less likely given that she's been using it for a while. No swelling or s/sx anaphylaxis. Monitor.   27. MASD: Gerrhard's butt cream ordered, changed to prn, continue  28. Visual deficits: encouraged outpatient follow-up with ophthalmologist, discussed correlation with type of stroke she had, discussed that left eye appears to be tracking better! Discussed with therapy that steristrips may  be removed for therapy  29. Insomnia: increase trazodone to 75mg  HS   LOS: 17 days A FACE TO FACE EVALUATION WAS PERFORMED  [address] Jesstin Studstill 11/11/2022, 11:28 AM

## 2022-11-11 NOTE — Plan of Care (Signed)
  Problem: Consults Goal: RH STROKE PATIENT EDUCATION Description: See Patient Education module for education specifics  Outcome: Progressing   Problem: RH BOWEL ELIMINATION Goal: RH STG MANAGE BOWEL WITH ASSISTANCE Description: STG Manage Bowel with mod I Assistance. Outcome: Progressing Goal: RH STG MANAGE BOWEL W/MEDICATION W/ASSISTANCE Description: STG Manage Bowel with Medication with mod I  Assistance. Outcome: Progressing   Problem: RH BLADDER ELIMINATION Goal: RH STG MANAGE BLADDER WITH ASSISTANCE Description: STG Manage Bladder With toileting Assistance Outcome: Progressing Goal: RH STG MANAGE BLADDER WITH MEDICATION WITH ASSISTANCE Description: STG Manage Bladder With Medication With mod I Assistance. Outcome: Progressing   Problem: RH SAFETY Goal: RH STG ADHERE TO SAFETY PRECAUTIONS W/ASSISTANCE/DEVICE Description: STG Adhere to Safety Precautions With cues  Assistance/Device. Outcome: Progressing   Problem: RH PAIN MANAGEMENT Goal: RH STG PAIN MANAGED AT OR BELOW PT'S PAIN GOAL Description: < 4 with prn Outcome: Progressing   Problem: RH KNOWLEDGE DEFICIT Goal: RH STG INCREASE KNOWLEDGE OF HYPERTENSION Description: Patient and spouse will be able to manage HTN using medications and dietary modifications using educational resources independently Outcome: Progressing Goal: RH STG INCREASE KNOWLEDGE OF DYSPHAGIA/FLUID INTAKE Description: Patient and spouse will be able to manage dysphagia, medications and dietary modifications using educational resources independently Outcome: Progressing Goal: RH STG INCREASE KNOWLEGDE OF HYPERLIPIDEMIA Description: Patient and spouse will be able to manage HLD using medications and dietary modifications using educational resources independently Outcome: Progressing Goal: RH STG INCREASE KNOWLEDGE OF STROKE PROPHYLAXIS Description: Patient and spouse will be able to manage secondary risks using medications and dietary modifications  using educational resources independently Outcome: Progressing   Problem: Education: Goal: Knowledge of disease or condition will improve Outcome: Progressing Goal: Knowledge of secondary prevention will improve (MUST DOCUMENT ALL) Outcome: Progressing Goal: Knowledge of patient specific risk factors will improve Loraine Leriche N/A or DELETE if not current risk factor) Outcome: Progressing   Problem: Ischemic Stroke/TIA Tissue Perfusion: Goal: Complications of ischemic stroke/TIA will be minimized Outcome: Progressing   Problem: Coping: Goal: Will verbalize positive feelings about self Outcome: Progressing Goal: Will identify appropriate support needs Outcome: Progressing   Problem: Health Behavior/Discharge Planning: Goal: Ability to manage health-related needs will improve Outcome: Progressing Goal: Goals will be collaboratively established with patient/family Outcome: Progressing   Problem: Self-Care: Goal: Ability to participate in self-care as condition permits will improve Outcome: Progressing Goal: Verbalization of feelings and concerns over difficulty with self-care will improve Outcome: Progressing Goal: Ability to communicate needs accurately will improve Outcome: Progressing   Problem: Nutrition: Goal: Risk of aspiration will decrease Outcome: Progressing Goal: Dietary intake will improve Outcome: Progressing   Problem: RH Vision Goal: RH LTG Vision (Specify) Outcome: Progressing

## 2022-11-11 NOTE — Progress Notes (Signed)
Speech Language Pathology Daily Session Note  Patient Details  Name: Joan Robinson MRN: [id number] Date of Birth: 12/04/1960  Today's Date: 11/11/2022 SLP Individual Time: 0855-0920 SLP Individual Time Calculation (min): 25 min  Short Term Goals: Week 3: SLP Short Term Goal 1 (Week 3): STGs=LTGs d/t ELOS  Skilled Therapeutic Interventions:   Pt seen for skilled SLP session to address speech goals. Pt's conversational speech observed to be 95-100% to unfamiliar listener during this session. She does presents with continued mild misarticulations of speech sounds secondary to L sided weakness. Pt is highly motivated and completes exercises independently. Pt left sitting upright in bed with call bell in reach and bed alarm set. Continue SLP PoC.   Pain Pain Assessment Pain Scale: 0-10 Pain Score: 0-No pain  Therapy/Group: Individual Therapy  Ellery Plunk 11/11/2022, 9:22 AM

## 2022-11-12 DIAGNOSIS — F411 Generalized anxiety disorder: Secondary | ICD-10-CM

## 2022-11-12 DIAGNOSIS — K562 Volvulus: Secondary | ICD-10-CM

## 2022-11-12 DIAGNOSIS — I63532 Cerebral infarction due to unspecified occlusion or stenosis of left posterior cerebral artery: Secondary | ICD-10-CM

## 2022-11-12 DIAGNOSIS — I69391 Dysphagia following cerebral infarction: Secondary | ICD-10-CM

## 2022-11-12 LAB — CBC
HCT: 32.1 % — ABNORMAL LOW (ref 36.0–46.0)
Hemoglobin: 10.6 g/dL — ABNORMAL LOW (ref 12.0–15.0)
MCH: 30.4 pg (ref 26.0–34.0)
MCHC: 33 g/dL (ref 30.0–36.0)
MCV: 92 fL (ref 80.0–100.0)
Platelets: 139 10*3/uL — ABNORMAL LOW (ref 150–400)
RBC: 3.49 MIL/uL — ABNORMAL LOW (ref 3.87–5.11)
RDW: 16.1 % — ABNORMAL HIGH (ref 11.5–15.5)
WBC: 3.1 10*3/uL — ABNORMAL LOW (ref 4.0–10.5)
nRBC: 0 % (ref 0.0–0.2)

## 2022-11-12 LAB — BASIC METABOLIC PANEL
Anion gap: 8 (ref 5–15)
BUN: 6 mg/dL — ABNORMAL LOW (ref 8–23)
CO2: 23 mmol/L (ref 22–32)
Calcium: 9 mg/dL (ref 8.9–10.3)
Chloride: 103 mmol/L (ref 98–111)
Creatinine, Ser: 1.09 mg/dL — ABNORMAL HIGH (ref 0.44–1.00)
GFR, Estimated: 58 mL/min — ABNORMAL LOW (ref >60)
Glucose, Bld: 134 mg/dL — ABNORMAL HIGH (ref 70–99)
Potassium: 4.2 mmol/L (ref 3.5–5.1)
Sodium: 134 mmol/L — ABNORMAL LOW (ref 135–145)

## 2022-11-12 MED ORDER — INFLUENZA VIRUS VACC SPLIT PF (FLUZONE) 0.5 ML IM SUSY
0.5000 mL | PREFILLED_SYRINGE | INTRAMUSCULAR | Status: AC
  Administered 2022-11-13: 0.5 mL via INTRAMUSCULAR
  Filled 2022-11-12: qty 0.5

## 2022-11-12 NOTE — Plan of Care (Signed)
  Problem: RH Swallowing Goal: LTG Patient will consume least restrictive diet using compensatory strategies with assistance (SLP) Description: LTG:  Patient will consume least restrictive diet using compensatory strategies with assistance (SLP) Outcome: Completed/Met Goal: LTG Pt will demonstrate functional change in swallow as evidenced by bedside/clinical objective assessment (SLP) Description: LTG: Patient will demonstrate functional change in swallow as evidenced by bedside/clinical objective assessment (SLP) Outcome: Completed/Met   Problem: RH Expression Communication Goal: LTG Patient will increase speech intelligibility (SLP) Description: LTG: Patient will increase speech intelligibility at word/phrase/conversation level with cues, % of the time (SLP) Outcome: Completed/Met   Problem: RH Attention Goal: LTG Patient will demonstrate this level of attention during functional activites (SLP) Description: LTG:  Patient will will demonstrate this level of attention during functional activites (SLP) Outcome: Completed/Met   Problem: RH Awareness Goal: LTG: Patient will demonstrate awareness during functional activites type of (SLP) Description: LTG: Patient will demonstrate awareness during functional activites type of (SLP) Outcome: Completed/Met

## 2022-11-12 NOTE — Progress Notes (Signed)
Inpatient Rehabilitation Care Coordinator Discharge Note   Patient Details  Name: Joan Robinson MRN: [id number] Date of Birth: 06/23/1960   Discharge location: HOME WITH HUSBAND AND DAUGHTER WHO WILL PROVIDE 24/7 CARE  Length of Stay: 19 DAYS  Discharge activity level: SUPERVISION-MIN LEVEL  Home/community participation: ACTIVE  Patient response WU:[id number] Literacy - How often do you need to have someone help you when you read instructions, pamphlets, or other written material from your doctor or pharmacy?: Never  Patient response YN:WGNFAO Isolation - How often do you feel lonely or isolated from those around you?: Never  Services provided included: MD, RD, PT, OT, SLP, RN, CM, TR, Pharmacy, Neuropsych, SW  Financial Services:  Field seismologist Utilized: Barrister's clerk  Choices offered to/list presented to: PT AND DAUGHTER  Follow-up services arranged:  Home Health, DME, Patient/Family has no preference for HH/DME agencies Home Health Agency: [facility]  PT  OT  SP  RN    DME : [facility] HAS ALL OTHER NEEDED EQUIPMENT  PT TO APPLY FOR MEDICAID AND SSD/SSI. GIVEN INFORMATION ON HOW TO APPLY ONLINE FOR BOTH HAS HANDICAPPED APPLICATION TO GET PLACARDS FOR CARS.  Patient response to transportation need: Is the patient able to respond to transportation needs?: Yes In the past 12 months, has lack of transportation kept you from medical appointments or from getting medications?: No In the past 12 months, has lack of transportation kept you from meetings, work, or from getting things needed for daily living?: No   Patient/Family verbalized understanding of follow-up arrangements:  Yes  Individual responsible for coordination of the follow-up plan: SELF AND Joan Robinson [phone]  Confirmed correct DME delivered: Lucy Chris 11/12/2022    Comments (or additional information):STEPHANIE WAS HERE DAILY AND DID HANDS ON EDUCATION. BOTH  FEEL COMFORTABLE WITH DISCHARGE HOME.  Summary of Stay    Date/Time Discharge Planning CSW  11/07/22 [phone] Daughter is here daily and has seen her in some of  her therapies. Home health arranged and awaiting equipment recommendations RGD  10/31/22 0817 Home with husband with daughter providing care husband works. Pt making good progress in therapies. Await team's recommendations RGD       Shina Wass, Lemar Livings

## 2022-11-12 NOTE — Progress Notes (Signed)
Speech Language Pathology Discharge Summary  Patient Details  Name: Joan Robinson MRN: [id number] Date of Birth: [birth year]  Date of Discharge from SLP service:November 12, 2022  Today's Date: 11/12/2022 SLP Individual Time: 4098-1191 SLP Individual Time Calculation (min): 45 min  Skilled Therapeutic Interventions:  SLP conducted skilled therapy session targeting cognitive, swallowing, and communication goals as well as discharge discussion with patient and present family. SLP administered COGNISTAT evaluation to determine any lingering cognitive deficits with patient scoring WFL on all domains assessed. 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 Patient is tolerating current diet with modified independence and utilizing speech intelligibility increasing strategies with modified independence. Patient has returned to cognitive baseline per patient and family report. Patient is appropriate for discharge from SLP services. See full discharge summary below.   Patient has met 5 of 5 long term goals.  Patient to discharge at overall Modified Independent level.  Reasons goals not met: n/a   Clinical Impression/Discharge Summary: Patient has made excellent progress during CIR admission and has met 5/5 long term goals set for duration of stay. Patient is currently at a modified independent level for all cognitive, swallowing, and speech strategies but would benefit from further SLP services at next venue of care to target trismus, which impacts ability to appropriately chew as well as impacting articulatory precision. Patient has largely returned to cognitive baseline level and demonstrates consistent memory, problem solving, awareness, and attention during speech therapy sessions. Patient is  appropriate for discharge from SLP CIR services; patient and family education complete.   Care Partner:  Caregiver Able to Provide Assistance:  (not needed)    Recommendation:  Outpatient SLP  Rationale for SLP Follow Up: Maximize functional communication;Maximize swallowing safety   Equipment: n/a   Reasons for discharge: Discharged from hospital   Patient/Family Agrees with Progress Made and Goals Achieved: Yes   Jeannie Done, M.A., CCC-SLP  Yetta Barre 11/12/2022, 1:41 PM

## 2022-11-12 NOTE — Progress Notes (Signed)
Physical Therapy Session Note  Patient Details  Name: Joan Robinson MRN: [id number] Date of Birth: [birth year]  Today's Date: 11/12/2022 PT Individual Time: 6213-0865 + [id number] PT Individual Time Calculation (min): 29 min  + 74 min  Short Term Goals: Week 3:  PT Short Term Goal 1 (Week 3): STG = LTTG  Skilled Therapeutic Interventions/Progress Updates:      1st session: Pt sitting up in wheelchair to start with her daughter present. Pt denies any pain. Feels ready for upcoming DC tomorrow.   Pt transported to ortho rehab gym for time management. Reviewed car transfers with patient and daughter, patient completed car transfer with CGA and no AD via stand step transfer. Car height set to simulate their personal vehicle that they will be going home in tomorrow. Also educated on MD clearance before returning to driving and patient understanding.  She then completed standing UE ergometer on [employer] at L6.5 resistance. Completed in standing to challenge endurance and balance - 4 minutes in forward direction and then 2.5 minutes in backward direction before needing a seated rest break.   Propelled herself > 142ft with supervision back to her room using BUE to propel. Improved ability to maintain straight path, improved stroke efficiency, and improved ability to turn.   Left sitting in wheelchair with daughter present, all needs met.     2nd session: Pt in bed to start - agreeable to therapy treatment. No reports of pain. Has her L eye taped shut and has her eye patch on.  Bed mobility completed at mod I level. Sit<>stand and stand pivot transfer with supervision into w/c. Transported outside to work on Risk analyst in outdoor setting. Sit<>stand to RW with setupA from w/c level - ambulates with CGA/minA and RW outdoors, up/down unlevel incline/declines, and around unfamiliar terrain - all surfaces paved and firm. Pt reports eye sensitivity to sunlight, despite having the patch  covering her eye.   Returned upstairs to [employer] floor. Developed and prepared HEP handout which is detailed below - placed in her CVA binder in her room.   Worked on standing balance in // bars: -firm surface eyes open, feet apart. + head nods L<>R. Unsupported -Firm surface eyes open, feet together, minA with persistent L trunk lean -foam surface eyes open, feet apart, CGA -foam surface eyes closed, feet apart, minA with persistent L trunk lean and postural sway.   Returned to her room via w/c propulsion ~162ft with supervision assist. Pt requesting to use the bathroom - completed stand pivot transfer with CGA and grab bars - continent of bladder void.   Returned to bed and LPN made aware of patient's headache, which she was requesting tylenol which was provided. All needs met at end.  Access Code: [email or web address] URL: [email or web address] Date: 11/12/2022 Prepared by: Wynelle Link PT, DPT  Exercises - Standing Tandem Balance with Counter Support  - 1 x daily - 7 x weekly - 3 sets - 10 reps - Narrow Stance with Head Nods and Counter Support  - 1 x daily - 7 x weekly - 3 sets - 10 reps - Narrow Stance with Counter Support  - 1 x daily - 7 x weekly - 3 sets - 10 reps - Side Stepping with Resistance at Ankles and Counter Support  - 1 x daily - 7 x weekly - 3 sets - 10 reps - Bridge with Hip Abduction and Resistance - Ground Touches  - 1 x daily - 7 x weekly - 3 sets -  10 reps - Forward and Backward Monster Walk with Resistance at Ankles and Counter Support  - 1 x daily - 7 x weekly - 3 sets - 10 reps - Clam with Resistance  - 1 x daily - 7 x weekly - 3 sets - 10 reps - Standing Hip Abduction with Resistance at Ankles and Counter Support  - 1 x daily - 7 x weekly - 3 sets - 10 reps - Standing Hip Extension with Resistance at Ankles and Counter Support  - 1 x daily - 7 x weekly - 3 sets - 10 reps   Therapy Documentation Precautions:  Precautions Precautions: Fall Precaution  Comments: 02, cortrak, indwelling cath, vision deficit, ataxia Restrictions Weight Bearing Restrictions: No General:       Therapy/Group: Individual Therapy  Admir Candelas P Oryan Winterton 11/12/2022, 8:01 AM

## 2022-11-12 NOTE — Progress Notes (Signed)
Occupational Therapy Discharge Summary  Patient Details  Name: Joan Robinson MRN: [id number] Date of Birth: [birth year]  Date of Discharge from OT service:November 12, 2022  Today's Date: 11/12/2022 OT Individual Time: 1045-1140 OT Individual Time Calculation (min): 55 min    Patient has met 13 of 13 long term goals due to improved activity tolerance, improved balance, postural control, ability to compensate for deficits, functional use of  LEFT upper and LEFT lower extremity, improved attention, improved awareness, and improved coordination.  Patient to discharge at overall Supervision level.  Patient's care partner is independent to provide the necessary physical assistance at discharge.    Reasons goals not met: NA  Recommendation:  Patient will benefit from ongoing skilled OT services in outpatient setting to continue to advance functional skills in the area of BADL, iADL, Vocation, and Reduce care partner burden.  Equipment: Wheelchair  Reasons for discharge: treatment goals met and discharge from hospital  Patient/family agrees with progress made and goals achieved: Yes  OT Discharge Precautions/Restrictions  Precautions Precautions: Fall Precaution Comments: ataxia, decreased vision, decreased sensation, lifting restriction (5lb) Restrictions Weight Bearing Restrictions: No   Pain Pain Assessment Pain Score: 0-No pain ADL ADL Eating: Set up Where Assessed-Eating: Chair Grooming: Setup Where Assessed-Grooming: Standing at sink Upper Body Bathing: Supervision/safety Where Assessed-Upper Body Bathing: Shower Lower Body Bathing: Supervision/safety Where Assessed-Lower Body Bathing: Shower Upper Body Dressing: Setup Where Assessed-Upper Body Dressing: Wheelchair Lower Body Dressing: Supervision/safety Where Assessed-Lower Body Dressing: Wheelchair Toileting: Supervision/safety Where Assessed-Toileting: Teacher, adult education: Close supervision Toilet Transfer  Method: Surveyor, minerals: Bedside commode, Grab bars Tub/Shower Transfer: Unable to assess Tub/Shower Transfer Method: Unable to assess [employer] Transfer: Close supervision Film/video editor Method: Warden/ranger: Sales promotion account executive Baseline Vision/History: 1 Wears glasses Patient Visual Report: Blurring of vision;Central vision impairment;Eye fatigue/eye pain/headache Vision Assessment?: Yes Eye Alignment: Impaired (comment) (left eye lower lid severely droops, upper lid droops, using steri strips and patch along with gel.  Left eye now moving with R eye) Ocular Range of Motion: Restricted on the left;Restricted looking up Alignment/Gaze Preference: Within Defined Limits Tracking/Visual Pursuits: Decreased smoothness of vertical tracking Saccades: Additional head turns occurred during testing Convergence: Impaired (comment) Visual Fields: Left visual field deficit Depth Perception: Undershoots Perception  Perception: Impaired Praxis Praxis: Impaired Praxis Impairment Details: Motor planning Cognition Cognition Overall Cognitive Status: Within Functional Limits for tasks assessed Arousal/Alertness: Awake/alert Orientation Level: Person;Situation;Place Person: Oriented Place: Oriented Situation: Oriented Memory: Appears intact Attention: Selective;Alternating Focused Attention: Appears intact Sustained Attention: Appears intact Selective Attention: Appears intact Awareness: Appears intact Awareness Impairment: Emergent impairment Problem Solving: Appears intact Problem Solving Impairment: (P) Functional basic Executive Function: Self Monitoring;Self Correcting;Decision Making;Initiating;Sequencing;Organizing Sequencing: Appears intact Organizing: Appears intact Decision Making: Appears intact Initiating: Appears intact Self Monitoring: Appears intact Self Correcting: Appears intact Safety/Judgment: Appears  intact Brief Interview for Mental Status (BIMS) Repetition of Three Words (First Attempt): 3 Temporal Orientation: Year: Correct Temporal Orientation: Month: Accurate within 5 days Temporal Orientation: Day: Correct Recall: "Sock": Yes, no cue required Recall: "Blue": Yes, no cue required Recall: "Bed": Yes, no cue required BIMS Summary Score: 15 Sensation Sensation Light Touch: Impaired by gross assessment Proprioception: Impaired by gross assessment Stereognosis: Impaired by gross assessment Additional Comments: has altered sensation bilaterally - right side lacks hot/cold, left side dense somatosensory loss Coordination Gross Motor Movements are Fluid and Coordinated: No Fine Motor Movements are Fluid and Coordinated: No Coordination and Movement Description: Ataxia, porrly graded  control Finger Nose Finger Test: undershooting 9 Hole Peg Test: Box and Blocks:  Left 30  Right 45 Motor  Motor Motor: Ataxia Mobility  Bed Mobility Bed Mobility: Rolling Right;Right Sidelying to Sit;Supine to Sit;Sit to Supine Rolling Right: Independent with assistive device Right Sidelying to Sit: Independent with assistive device Supine to Sit: Independent with assistive device Sit to Supine: Independent with assistive device Transfers Sit to Stand: Supervision/Verbal cueing;Set up assist Stand to Sit: Supervision/Verbal cueing;Set up assist  Trunk/Postural Assessment  Cervical Assessment Cervical Assessment: Exceptions to [facility] (tips head backward) Thoracic Assessment Thoracic Assessment: Within Functional Limits Lumbar Assessment Lumbar Assessment: Within Functional Limits Postural Control Postural Control: Deficits on evaluation Protective Responses: premature and exaggerated  Balance Balance Balance Assessed: Yes Static Sitting Balance Static Sitting - Balance Support: No upper extremity supported;Feet supported Static Sitting - Level of Assistance: 7: Independent Dynamic Sitting  Balance Dynamic Sitting - Balance Support: No upper extremity supported;Feet supported;During functional activity Dynamic Sitting - Level of Assistance: 5: Stand by assistance Static Standing Balance Static Standing - Balance Support: Right upper extremity supported;Left upper extremity supported;During functional activity Static Standing - Level of Assistance: 5: Stand by assistance;6: Modified independent (Device/Increase time) Dynamic Standing Balance Dynamic Standing - Balance Support: Right upper extremity supported;Left upper extremity supported;During functional activity Dynamic Standing - Level of Assistance: 5: Stand by assistance Extremity/Trunk Assessment RUE Assessment RUE Assessment: Within Functional Limits LUE Assessment LUE Assessment: Exceptions to [facility] Active Range of Motion (AROM) Comments: ~ 160* shoulder flexion with slight elbow flexion LUE Body System: Neuro Brunstrum levels for arm and hand: Arm;Hand Brunstrum level for arm: Stage V Relative Independence from Synergy Brunstrum level for hand: Stage VI Isolated joint movements   Merleen Milliner M 11/12/2022, 1:05 PM

## 2022-11-12 NOTE — Plan of Care (Signed)
  Problem: RH Balance Goal: LTG Patient will maintain dynamic standing balance (PT) Description: LTG:  Patient will maintain dynamic standing balance with assistance during mobility activities (PT) Outcome: Not Met (add Reason) - required minA due to ataxia, sensory deficits, and proprioception impairments.  Problem: Sit to Stand Goal: LTG:  Patient will perform sit to stand with assistance level (PT) Description: LTG:  Patient will perform sit to stand with assistance level (PT) Outcome: Completed/Met   Problem: RH Bed Mobility Goal: LTG Patient will perform bed mobility with assist (PT) Description: LTG: Patient will perform bed mobility with assistance, with/without cues (PT). Outcome: Completed/Met   Problem: RH Bed to Chair Transfers Goal: LTG Patient will perform bed/chair transfers w/assist (PT) Description: LTG: Patient will perform bed to chair transfers with assistance (PT). Outcome: Completed/Met   Problem: RH Car Transfers Goal: LTG Patient will perform car transfers with assist (PT) Description: LTG: Patient will perform car transfers with assistance (PT). Outcome: Completed/Met   Problem: RH Ambulation Goal: LTG Patient will ambulate in controlled environment (PT) Description: LTG: Patient will ambulate in a controlled environment, # of feet with assistance (PT). Outcome: Completed/Met Goal: LTG Patient will ambulate in home environment (PT) Description: LTG: Patient will ambulate in home environment, # of feet with assistance (PT). Outcome: Completed/Met

## 2022-11-12 NOTE — Progress Notes (Signed)
Physical Therapy Discharge Summary  Patient Details  Name: Joan Robinson MRN: [id number] Date of Birth: 1960/09/29  Date of Discharge from PT service:November 12, 2022  Patient has met 6 of 7 long term goals due to improved activity tolerance, improved balance, improved postural control, increased strength, ability to compensate for deficits, functional use of  left upper extremity and left lower extremity, improved attention, improved awareness, and improved coordination.  Patient to discharge at an ambulatory level Min Assist.   Patient's care partner is independent to provide the necessary physical assistance at discharge.  Reasons goals not met: Dynamic standing balance goal set to CGA - pt required minA for dynamic standing balance due to ataxia and persistent L Trunk lean.  Recommendation:  Patient will benefit from ongoing skilled PT services in home health setting to continue to advance safe functional mobility, address ongoing impairments in dynamic standing balance, dynamic gait, home safety, caregiver training, and minimize fall risk.  Equipment: Custom wheelchair through [employer]  Reasons for discharge: treatment goals met and discharge from hospital  Patient/family agrees with progress made and goals achieved: Yes  PT Discharge Precautions/Restrictions Precautions Precautions: Fall Precaution Comments: ataxia, decreased vision, decreased sensation, lifting restriction (5lb) Restrictions Weight Bearing Restrictions: No Pain Pain Assessment Pain Scale: 0-10 Pain Score: 0-No pain Pain Interference Pain Interference Pain Effect on Sleep: 1. Rarely or not at all Pain Interference with Therapy Activities: 1. Rarely or not at all Pain Interference with Day-to-Day Activities: 1. Rarely or not at all Vision/Perception  Vision - History Ability to See in Adequate Light: 3 Highly impaired Vision - Assessment Eye Alignment: Impaired (comment) (L lower eyelid droops. Lids  taped shut with steri-strips) Ocular Range of Motion: Restricted on the left;Restricted looking up Alignment/Gaze Preference: Within Defined Limits Tracking/Visual Pursuits: Decreased smoothness of vertical tracking Saccades: Additional head turns occurred during testing Convergence: Impaired (comment) Perception Perception: Impaired Preception Impairment Details: Inattention/Neglect Perception-Other Comments: mild on the L Praxis Praxis: Impaired Praxis Impairment Details: Motor planning  Cognition Overall Cognitive Status: Within Functional Limits for tasks assessed Arousal/Alertness: Awake/alert Orientation Level: Oriented X4 Year: 2024 Month: October Day of Week: Correct Attention: Selective;Alternating Focused Attention: Appears intact Sustained Attention: Appears intact Sustained Attention Impairment: Functional basic;Verbal basic Selective Attention: Appears intact Memory: Appears intact Memory Impairment: Retrieval deficit;Decreased short term memory Decreased Short Term Memory: Functional basic Awareness: Appears intact Awareness Impairment: Emergent impairment Problem Solving: Appears intact Problem Solving Impairment: Functional basic Executive Function: Self Monitoring;Self Correcting;Decision Making;Initiating;Sequencing;Organizing Sequencing: Appears intact Sequencing Impairment: Functional basic Organizing: Appears intact Organizing Impairment: Functional basic Decision Making: Appears intact Decision Making Impairment: Functional basic Initiating: Appears intact Initiating Impairment: Functional basic Self Monitoring: Appears intact Self Monitoring Impairment: Functional basic Self Correcting: Appears intact Self Correcting Impairment: Functional basic Safety/Judgment: Appears intact Sensation Sensation Light Touch: Impaired Detail Peripheral sensation comments: Numbness: back of R leg/hip, R upper trap, L facial numbness. Hot/Cold: Impaired Detail  (absent: R hand. Impaired: Facial) Hot/Cold Impaired Details: Absent RUE Proprioception: Impaired by gross assessment Stereognosis: Impaired by gross assessment (able to detect a metal "key" in both hands with eyes closed) Additional Comments: has altered sensation bilaterally - right side lacks hot/cold, left side dense somatosensory loss Coordination Gross Motor Movements are Fluid and Coordinated: No Fine Motor Movements are Fluid and Coordinated: No Coordination and Movement Description: Ataxia, porrly graded control Finger Nose Finger Test: undershooting Heel Shin Test: slowed on L > R 9 Hole Peg Test: Box and Blocks:  Left 30  Right 45  Motor  Motor Motor: Ataxia;Abnormal postural alignment and control  Mobility Bed Mobility Bed Mobility: Rolling Right;Right Sidelying to Sit;Supine to Sit;Sit to Supine Rolling Right: Independent with assistive device Right Sidelying to Sit: Independent with assistive device Supine to Sit: Independent with assistive device Sit to Supine: Independent with assistive device Transfers Transfers: Sit to Stand;Stand to Sit;Stand Pivot Transfers Sit to Stand: Supervision/Verbal cueing;Set up assist Stand to Sit: Supervision/Verbal cueing;Set up assist Stand Pivot Transfers: Supervision/Verbal cueing;Set up assist Stand Pivot Transfer Details: Tactile cues for initiation;Tactile cues for sequencing;Tactile cues for weight shifting;Tactile cues for posture;Tactile cues for placement;Verbal cues for sequencing;Manual facilitation for weight shifting;Manual facilitation for placement Transfer (Assistive device): 1 person hand held assist Locomotion  Gait Ambulation: Yes Gait Assistance: Contact Guard/Touching assist;Minimal Assistance - Patient > 75% Gait Distance (Feet): 150 Feet Assistive device: Rolling walker Gait Assistance Details: Tactile cues for initiation;Tactile cues for weight shifting;Tactile cues for posture;Verbal cues for gait  pattern;Verbal cues for technique;Verbal cues for precautions/safety;Verbal cues for safe use of DME/AE;Manual facilitation for weight shifting Gait Gait: Yes Gait Pattern: Impaired Gait Pattern: Step-through pattern;Trunk flexed;Ataxic;Decreased stride length;Decreased weight shift to left;Poor foot clearance - left;Lateral trunk lean to left Stairs / Additional Locomotion Stairs: Yes Stairs Assistance: Contact Guard/Touching assist Stair Management Technique: Two rails;Forwards;Step to pattern Number of Stairs: 12 Height of Stairs: 6 Pick up small object from the floor assist level: Minimal Assistance - Patient > 75% Wheelchair Mobility Wheelchair Mobility: Yes Wheelchair Assistance: Doctor, general practice: Both upper extremities Wheelchair Parts Management: Needs assistance Distance: 158ft  Trunk/Postural Assessment  Cervical Assessment Cervical Assessment: Exceptions to [facility] (tips head backwards into extension) Thoracic Assessment Thoracic Assessment: Exceptions to [facility] (rounded shoulders; kyphosis) Lumbar Assessment Lumbar Assessment: Within Functional Limits Postural Control Postural Control: Deficits on evaluation Protective Responses: premature and exaggerated  Balance Balance Balance Assessed: Yes Standardized Balance Assessment Standardized Balance Assessment: Berg Balance Test Berg Balance Test Sit to Stand: Able to stand  independently using hands Standing Unsupported: Able to stand 2 minutes with supervision Sitting with Back Unsupported but Feet Supported on Floor or Stool: Able to sit safely and securely 2 minutes Stand to Sit: Controls descent by using hands Transfers: Able to transfer safely, definite need of hands Standing Unsupported with Eyes Closed: Able to stand 10 seconds with supervision Standing Ubsupported with Feet Together: Needs help to attain position but able to stand for 30 seconds with feet together From Standing, Reach  Forward with Outstretched Arm: Reaches forward but needs supervision From Standing Position, Pick up Object from Floor: Unable to pick up and needs supervision From Standing Position, Turn to Look Behind Over each Shoulder: Needs supervision when turning Turn 360 Degrees: Needs assistance while turning Standing Unsupported, Alternately Place Feet on Step/Stool: Needs assistance to keep from falling or unable to try Standing Unsupported, One Foot in Front: Loses balance while stepping or standing Standing on One Leg: Unable to try or needs assist to prevent fall Total Score: 23 Static Sitting Balance Static Sitting - Balance Support: No upper extremity supported;Feet supported Static Sitting - Level of Assistance: 7: Independent Dynamic Sitting Balance Dynamic Sitting - Balance Support: No upper extremity supported;Feet supported;During functional activity Dynamic Sitting - Level of Assistance: 5: Stand by assistance Static Standing Balance Static Standing - Balance Support: Right upper extremity supported;Left upper extremity supported;During functional activity Static Standing - Level of Assistance: 5: Stand by assistance;6: Modified independent (Device/Increase time) Dynamic Standing Balance Dynamic Standing - Balance Support: Right upper extremity supported;Left  upper extremity supported;During functional activity Dynamic Standing - Level of Assistance: 4: Min assist Extremity Assessment  RUE Assessment RUE Assessment: Within Functional Limits LUE Assessment LUE Assessment: Exceptions to [facility] Active Range of Motion (AROM) Comments: ~ 160* shoulder flexion with slight elbow flexion LUE Body System: Neuro Brunstrum levels for arm and hand: Arm;Hand Brunstrum level for arm: Stage V Relative Independence from Synergy Brunstrum level for hand: Stage VI Isolated joint movements RLE Assessment RLE Assessment: Exceptions to [facility] General Strength Comments: Grossly 4/5 LLE Assessment LLE  Assessment: Exceptions to [facility] General Strength Comments: Grossly 4/5   HEP: Access Code: [email or web address] URL: [email or web address] Date: 11/12/2022 Prepared by: Wynelle Link PT, DPT  Exercises - Standing Tandem Balance with Counter Support  - 1 x daily - 7 x weekly - 3 sets - 10 reps - Narrow Stance with Head Nods and Counter Support  - 1 x daily - 7 x weekly - 3 sets - 10 reps - Narrow Stance with Counter Support  - 1 x daily - 7 x weekly - 3 sets - 10 reps - Side Stepping with Resistance at Ankles and Counter Support  - 1 x daily - 7 x weekly - 3 sets - 10 reps - Bridge with Hip Abduction and Resistance - Ground Touches  - 1 x daily - 7 x weekly - 3 sets - 10 reps - Forward and Backward Monster Walk with Resistance at Ankles and Counter Support  - 1 x daily - 7 x weekly - 3 sets - 10 reps - Clam with Resistance  - 1 x daily - 7 x weekly - 3 sets - 10 reps - Standing Hip Abduction with Resistance at Ankles and Counter Support  - 1 x daily - 7 x weekly - 3 sets - 10 reps - Standing Hip Extension with Resistance at Ankles and Counter Support  - 1 x daily - 7 x weekly - 3 sets - 10 reps   [employer]  PT, DPT, CSRS  11/12/2022, 2:49 PM

## 2022-11-12 NOTE — Plan of Care (Signed)
  Problem: Consults Goal: RH STROKE PATIENT EDUCATION Description: See Patient Education module for education specifics  Outcome: Progressing   Problem: RH BOWEL ELIMINATION Goal: RH STG MANAGE BOWEL WITH ASSISTANCE Description: STG Manage Bowel with mod I Assistance. Outcome: Progressing Goal: RH STG MANAGE BOWEL W/MEDICATION W/ASSISTANCE Description: STG Manage Bowel with Medication with mod I  Assistance. Outcome: Progressing   Problem: RH BLADDER ELIMINATION Goal: RH STG MANAGE BLADDER WITH ASSISTANCE Description: STG Manage Bladder With toileting Assistance Outcome: Progressing Goal: RH STG MANAGE BLADDER WITH MEDICATION WITH ASSISTANCE Description: STG Manage Bladder With Medication With mod I Assistance. Outcome: Progressing   Problem: RH SAFETY Goal: RH STG ADHERE TO SAFETY PRECAUTIONS W/ASSISTANCE/DEVICE Description: STG Adhere to Safety Precautions With cues  Assistance/Device. Outcome: Progressing   Problem: RH PAIN MANAGEMENT Goal: RH STG PAIN MANAGED AT OR BELOW PT'S PAIN GOAL Description: < 4 with prn Outcome: Progressing   Problem: RH KNOWLEDGE DEFICIT Goal: RH STG INCREASE KNOWLEDGE OF HYPERTENSION Description: Patient and spouse will be able to manage HTN using medications and dietary modifications using educational resources independently Outcome: Progressing Goal: RH STG INCREASE KNOWLEDGE OF DYSPHAGIA/FLUID INTAKE Description: Patient and spouse will be able to manage dysphagia, medications and dietary modifications using educational resources independently Outcome: Progressing Goal: RH STG INCREASE KNOWLEGDE OF HYPERLIPIDEMIA Description: Patient and spouse will be able to manage HLD using medications and dietary modifications using educational resources independently Outcome: Progressing Goal: RH STG INCREASE KNOWLEDGE OF STROKE PROPHYLAXIS Description: Patient and spouse will be able to manage secondary risks using medications and dietary modifications  using educational resources independently Outcome: Progressing   Problem: Education: Goal: Knowledge of disease or condition will improve Outcome: Progressing Goal: Knowledge of secondary prevention will improve (MUST DOCUMENT ALL) Outcome: Progressing Goal: Knowledge of patient specific risk factors will improve Loraine Leriche N/A or DELETE if not current risk factor) Outcome: Progressing   Problem: Ischemic Stroke/TIA Tissue Perfusion: Goal: Complications of ischemic stroke/TIA will be minimized Outcome: Progressing   Problem: Coping: Goal: Will verbalize positive feelings about self Outcome: Progressing Goal: Will identify appropriate support needs Outcome: Progressing   Problem: Health Behavior/Discharge Planning: Goal: Ability to manage health-related needs will improve Outcome: Progressing Goal: Goals will be collaboratively established with patient/family Outcome: Progressing   Problem: Self-Care: Goal: Ability to participate in self-care as condition permits will improve Outcome: Progressing Goal: Verbalization of feelings and concerns over difficulty with self-care will improve Outcome: Progressing Goal: Ability to communicate needs accurately will improve Outcome: Progressing   Problem: Nutrition: Goal: Risk of aspiration will decrease Outcome: Progressing Goal: Dietary intake will improve Outcome: Progressing   Problem: RH Vision Goal: RH LTG Vision (Specify) Outcome: Progressing

## 2022-11-12 NOTE — Progress Notes (Signed)
PROGRESS NOTE   Subjective/Complaints: Pt reports improved voiding. Is complaining of frequent mushy stools and would like stool softeners stopped. Asked about flu shot. Was nauseas yesterday  ROS: Patient denies fever, rash, sore throat, blurred vision, dizziness,  vomiting, cough, shortness of breath or chest pain, joint or back/neck pain, headache, or mood change.    Objective:   No results found. Recent Labs    11/12/22 0744  WBC 3.1*  HGB 10.6*  HCT 32.1*  PLT 139*     Recent Labs    11/12/22 0744  NA 134*  K 4.2  CL 103  CO2 23  GLUCOSE 134*  BUN 6*  CREATININE 1.09*  CALCIUM 9.0      Intake/Output Summary (Last 24 hours) at 11/12/2022 1000 Last data filed at 11/12/2022 0700 Gross per 24 hour  Intake 358 ml  Output --  Net 358 ml           Physical Exam: Vital Signs Blood pressure 111/71, pulse 69, temperature 97.8 F (36.6 C), temperature source Oral, resp. rate 18, height 5\' 6"  (1.676 m), weight 68.3 kg, SpO2 100%. Constitutional: No distress . Vital signs reviewed. HEENT: NCAT, EOMI, oral membranes moist. Left eye patch, lid is steristripped for closure Neck: supple Cardiovascular: RRR without murmur. No JVD    Respiratory/Chest: CTA Bilaterally without wheezes or rales. Normal effort    GI/Abdomen: BS +, minimal tenderness, wound clean Ext: no clubbing, cyanosis, or edema Psych: pleasant and cooperative. Sl anxious Neuro: L facial droop, decreased hearing left ear. Alert, some memory deficits, decreased sensation to right side of face and right hand, +vertebrobasilar insufficiency Musculoskeletal:     Cervical back: Neck supple. No tenderness.     Comments: 5/5 strength in upper extremities and 5/5 in lower extremities , stable 10/25  Skin:    General: Skin is warm and dry.     Comments: IV L AC fossa and R forearm- look OK      Assessment/Plan: 1. Functional deficits which  require 3+ hours per day of interdisciplinary therapy in a comprehensive inpatient rehab setting. Physiatrist is providing close team supervision and 24 hour management of active medical problems listed below. Physiatrist and rehab team continue to assess barriers to discharge/monitor patient progress toward functional and medical goals  Care Tool:  Bathing    Body parts bathed by patient: Right arm, Left arm, Chest, Abdomen, Front perineal area, Buttocks, Right upper leg, Left upper leg, Right lower leg, Face, Left lower leg   Body parts bathed by helper: Right arm, Left lower leg, Right lower leg, Buttocks, Front perineal area, Right upper leg, Left upper leg     Bathing assist Assist Level: Contact Guard/Touching assist     Upper Body Dressing/Undressing Upper body dressing   What is the patient wearing?: Pull over shirt    Upper body assist Assist Level: Minimal Assistance - Patient > 75%    Lower Body Dressing/Undressing Lower body dressing      What is the patient wearing?: Pants, Underwear/pull up     Lower body assist Assist for lower body dressing: Contact Guard/Touching assist     Toileting Toileting Toileting Activity did not  occur Press photographer and hygiene only): N/A (no void or bm)  Toileting assist Assist for toileting: Minimal Assistance - Patient > 75%     Transfers Chair/bed transfer  Transfers assist  Chair/bed transfer activity did not occur: Safety/medical concerns  Chair/bed transfer assist level: Contact Guard/Touching assist     Locomotion Ambulation   Ambulation assist      Assist level: 2 helpers Assistive device:  (R handrail) Max distance: 114ft   Walk 10 feet activity   Assist     Assist level: 2 helpers Assistive device: Other (comment) (R handrail)   Walk 50 feet activity   Assist Walk 50 feet with 2 turns activity did not occur: Safety/medical concerns (fatigue)  Assist level: 2 helpers Assistive device:  Other (comment) (R handrial)    Walk 150 feet activity   Assist Walk 150 feet activity did not occur: Safety/medical concerns         Walk 10 feet on uneven surface  activity   Assist Walk 10 feet on uneven surfaces activity did not occur: Safety/medical concerns         Wheelchair     Assist Is the patient using a wheelchair?: Yes Type of Wheelchair: Manual    Wheelchair assist level: Supervision/Verbal cueing Max wheelchair distance: 245ft    Wheelchair 50 feet with 2 turns activity    Assist        Assist Level: Supervision/Verbal cueing   Wheelchair 150 feet activity     Assist      Assist Level: Supervision/Verbal cueing   Blood pressure 111/71, pulse 69, temperature 97.8 F (36.6 C), temperature source Oral, resp. rate 18, height 5\' 6"  (1.676 m), weight 68.3 kg, SpO2 100%.  Medical Problem List and Plan: 1. Functional deficits secondary to L PICA/AICA stroke with cerebellar edema             -patient may  shower- if cover incisions             -ELOS/Goals:  10/29  supervision             -Continue CIR therapies including PT, OT   Head CT reviewed with patient 10/14 and improvements discussed   Disability paperwork given to [facility] for completion 2.  Antithrombotics: -DVT/anticoagulation:  Pharmaceutical: Heparin 5000U q8h             -antiplatelet therapy: Plavix 75mg  daily   3. Chronic back pain: kpad ordered. Tylenol as needed             -continue Norco 5/325 one tablet q HS             Robaxin d/ced due to hypotension   4. Anxiety: lexapro restarted daily at 10mg               -continue trazodone 50 mg q HS-- timing changed to 8pm -continue melatonin 10 mg q HS-- d/c'd but unclear why, not sleeping well, reordered on 10/27/22             -antipsychotic agents: n/a   5. Neuropsych/cognition: This patient is? capable of making decisions on her (since sedated, cannot tell) own behalf.   6. Skin/Wound Care: Routine skin care  checks             -continue packing of lower aspect of midline incision BID   7. Fluids/Electrolytes/Nutrition: Routine Is and Os and follow-up chemistries             -Pt has been upgraded to D3  diet.    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 Cliffton Asters  - daughter educated regarding wound care and allowed to perform dressing changes when she is available   11: Hypothyroidism: continue Synthroid daily   12: GERD: continue Protonix 40mg  daily   13: History of esophageal stricture s/p dilation   14: History of sleep apnea: no CPAP   15: Left eyelid palsy/irritation: continue patch             -continue Lacrilube, artificial tears   16: Superficial mid-line incisional infection drained at bedside             -continue wet to dry dressings BID   17. UTI: Bactrim started 10/25 for 3 day course  18. Leukocytosis: WBC reviewed 10/14 and has resolved  19. Nausea: seems to be mostly with movement/transitions, has zofran ordered but will trial meclizine to see if this helps with the movement aspect (pt doesn't state she's dizzy but how she describes her symptoms seems to have a rotational/movement component); started with 12.5mg  TID PRN, could always go up also.  -schedule zofran IV daily at 7am, discussed this helped, continue, messaged nursing regarding last BM since Zofran can be constipating.   20. Vertigo: d/c scopolamine patch, d/c meclizine since not helpful, discussed 2/2 vertebrobasilar insufficiency. 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 Lethargy: much improved, diet upgraded to D2/honey, BUN reviewed and improved, increase Vyvance to 70mg  daily  22.  Decreased appetite: improved, d/c cortrak. Diet upgraded to D2/thins!  23. Decreased hearing left ear: discussed may be due to CN 8 being affected by stroke. Debrox ear drops ordered in case wax buildup is contributing  24. ADHD: Increased Vyvanse to 70mg  daily.   25. Constipation: resolved  10/28 pt now having frequent mushy stools--have held all softeners and laxs  26. Oral irritation/burning: no definite thrush but nystatin started by Dr. Benjie Karvonen-- continue this, ordered magic mouthwash with lidocaine to help. Could be related to thickener but feel this is less likely given that she's been using it for a while. No swelling or s/sx anaphylaxis.   10/28 appears resolved   27. MASD: Gerrhard's butt cream ordered, changed to prn, continue  28. Visual deficits: encouraged outpatient follow-up with ophthalmologist, discussed correlation with type of stroke she had, discussed that left eye appears to be tracking better! Discussed with therapy that steristrips may be removed for therapy  -pt already working on outpt ophtho appt 29. Insomnia: increased trazodone to 75mg  HS  with some improvement  LOS: 18 days A FACE TO FACE EVALUATION WAS PERFORMED  Ranelle Oyster 11/12/2022, 10:00 AM

## 2022-11-13 ENCOUNTER — Other Ambulatory Visit (HOSPITAL_COMMUNITY): Payer: Self-pay

## 2022-11-13 MED ORDER — MELATONIN 10 MG PO TABS: 10.0000 mg | ORAL_TABLET | Freq: Every evening | ORAL | Status: DC | PRN

## 2022-11-13 MED ORDER — ACETAMINOPHEN 325 MG PO TABS: 325.0000 mg | ORAL_TABLET | ORAL | Status: AC | PRN

## 2022-11-13 MED ORDER — ARTIFICIAL TEARS OPHTHALMIC OINT
TOPICAL_OINTMENT | Freq: Two times a day (BID) | OPHTHALMIC | 1 refills | Status: DC
  Filled 2022-11-13: qty 1, fill #0

## 2022-11-13 MED ORDER — POLYVINYL ALCOHOL 1.4 % OP SOLN
2.0000 [drp] | Freq: Four times a day (QID) | OPHTHALMIC | 0 refills | Status: DC
  Filled 2022-11-13: qty 15, 38d supply, fill #0

## 2022-11-13 MED ORDER — CLOPIDOGREL BISULFATE 75 MG PO TABS
75.0000 mg | ORAL_TABLET | Freq: Every day | ORAL | 0 refills | Status: DC
  Filled 2022-11-13: qty 30, 30d supply, fill #0

## 2022-11-13 NOTE — Progress Notes (Signed)
PROGRESS NOTE   Subjective/Complaints: No new complaints this morning Feels ready for d/c today Asks why she may have developed hernia/narrowing of colon  ROS: Patient denies fever, rash, sore throat, blurred vision, dizziness,  vomiting, cough, shortness of breath or chest pain, joint or back/neck pain, headache, or mood change.    Objective:   No results found. Recent Labs    11/12/22 0744  WBC 3.1*  HGB 10.6*  HCT 32.1*  PLT 139*     Recent Labs    11/12/22 0744  NA 134*  K 4.2  CL 103  CO2 23  GLUCOSE 134*  BUN 6*  CREATININE 1.09*  CALCIUM 9.0      Intake/Output Summary (Last 24 hours) at 11/13/2022 0955 Last data filed at 11/13/2022 0800 Gross per 24 hour  Intake 536 ml  Output --  Net 536 ml           Physical Exam: Vital Signs Blood pressure 106/73, pulse 75, temperature 97.9 F (36.6 C), resp. rate 17, height 5\' 6"  (1.676 m), weight 64.1 kg, SpO2 100%. Constitutional: No distress . Vital signs reviewed. HEENT: NCAT, EOMI, oral membranes moist. Left eye patch, lid is steristripped for closure Neck: supple Cardiovascular: RRR without murmur. No JVD    Respiratory/Chest: CTA Bilaterally without wheezes or rales. Normal effort    GI/Abdomen: BS +, minimal tenderness, wound clean Ext: no clubbing, cyanosis, or edema Psych: pleasant and cooperative. Sl anxious Neuro: L facial droop, decreased hearing left ear. Alert, some memory deficits, decreased sensation to right side of face and right hand, +vertebrobasilar insufficiency Musculoskeletal:     Cervical back: Neck supple. No tenderness.     Comments: 5/5 strength in upper extremities and 5/5 in lower extremities , stable 10/29 Skin:    General: Skin is warm and dry.     Comments: IV L AC fossa and R forearm- look OK      Assessment/Plan: 1. Functional deficits which require 3+ hours per day of interdisciplinary therapy in a  comprehensive inpatient rehab setting. Physiatrist is providing close team supervision and 24 hour management of active medical problems listed below. Physiatrist and rehab team continue to assess barriers to discharge/monitor patient progress toward functional and medical goals  Care Tool:  Bathing    Body parts bathed by patient: Right arm, Left arm, Chest, Abdomen, Front perineal area, Buttocks, Right upper leg, Left upper leg, Right lower leg, Face, Left lower leg   Body parts bathed by helper: Right arm, Left lower leg, Right lower leg, Buttocks, Front perineal area, Right upper leg, Left upper leg     Bathing assist Assist Level: Set up assist     Upper Body Dressing/Undressing Upper body dressing   What is the patient wearing?: Pull over shirt    Upper body assist Assist Level: Set up assist    Lower Body Dressing/Undressing Lower body dressing      What is the patient wearing?: Pants, Underwear/pull up     Lower body assist Assist for lower body dressing: Set up assist     Toileting Toileting Toileting Activity did not occur (Clothing management and hygiene only): N/A (no void or bm)  Toileting assist Assist for toileting: Set up assist     Transfers Chair/bed transfer  Transfers assist  Chair/bed transfer activity did not occur: Safety/medical concerns  Chair/bed transfer assist level: Independent with assistive device Chair/bed transfer assistive device: Armrests, Geologist, engineering   Ambulation assist      Assist level: Minimal Assistance - Patient > 75% Assistive device: Walker-rolling Max distance: 119ft   Walk 10 feet activity   Assist     Assist level: Minimal Assistance - Patient > 75% Assistive device: Walker-rolling   Walk 50 feet activity   Assist Walk 50 feet with 2 turns activity did not occur: Safety/medical concerns (fatigue)  Assist level: Minimal Assistance - Patient > 75% Assistive device: Walker-rolling     Walk 150 feet activity   Assist Walk 150 feet activity did not occur: Safety/medical concerns  Assist level: Minimal Assistance - Patient > 75% Assistive device: Walker-rolling    Walk 10 feet on uneven surface  activity   Assist Walk 10 feet on uneven surfaces activity did not occur: Safety/medical concerns   Assist level: Minimal Assistance - Patient > 75% Assistive device: Walker-rolling   Wheelchair     Assist Is the patient using a wheelchair?: Yes Type of Wheelchair: Manual    Wheelchair assist level: Supervision/Verbal cueing Max wheelchair distance: 259ft    Wheelchair 50 feet with 2 turns activity    Assist        Assist Level: Supervision/Verbal cueing   Wheelchair 150 feet activity     Assist      Assist Level: Supervision/Verbal cueing   Blood pressure 106/73, pulse 75, temperature 97.9 F (36.6 C), resp. rate 17, height 5\' 6"  (1.676 m), weight 64.1 kg, SpO2 100%.  Medical Problem List and Plan: 1. Functional deficits secondary to L PICA/AICA stroke with cerebellar edema             -patient may  shower- if cover incisions             -ELOS/Goals:  10/29  supervision            D/c home today  Head CT reviewed with patient 10/14 and improvements discussed   Disability paperwork given to [facility] for completion 2.  Antithrombotics: -DVT/anticoagulation:  Pharmaceutical: Heparin 5000U q8h, d/c upon d/c from hospital             -antiplatelet therapy: Plavix 75mg  daily   3. Chronic back pain: kpad ordered. Tylenol as needed             -continue Norco 5/325 one tablet q HS             Robaxin d/ced due to hypotension   4. Anxiety: lexapro restarted daily at 10mg               -continue trazodone 50 mg q HS-- timing changed to 8pm -continue melatonin 10 mg q HS-- d/c'd but unclear why, not sleeping well, reordered on 10/27/22             -antipsychotic agents: n/a   5. Neuropsych/cognition: This patient is? capable of making  decisions on her (since sedated, cannot tell) own behalf.   6. Skin/Wound Care: Routine skin care checks             -continue packing of lower aspect of midline incision BID   7. Fluids/Electrolytes/Nutrition: Routine Is and Os and follow-up chemistries             -Pt  has been upgraded to D3 diet.    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 Cliffton Asters  - daughter educated regarding wound care and allowed to perform dressing changes when she is available  -discussed risk factors for this condition/what she can do to prevent recurrence   11: Hypothyroidism: continue Synthroid daily   12: GERD: continue Protonix 40mg  daily   13: History of esophageal stricture s/p dilation   14: History of sleep apnea: no CPAP   15: Left eyelid palsy/irritation: continue patch             -continue Lacrilube, artificial tears   16: Superficial mid-line incisional infection drained at bedside             -continue wet to dry dressings BID   17. UTI: Bactrim started 10/25 for 3 day course  18. Leukocytosis: WBC reviewed 10/14 and has resolved  19. Nausea: seems to be mostly with movement/transitions, has zofran ordered but will trial meclizine to see if this helps with the movement aspect (pt doesn't state she's dizzy but how she describes her symptoms seems to have a rotational/movement component); started with 12.5mg  TID PRN, could always go up also.  -schedule zofran IV daily at 7am, discussed this helped, continue, messaged nursing regarding last BM since Zofran can be constipating.   20. Vertigo: d/c scopolamine patch, d/c meclizine since not helpful, discussed 2/2 vertebrobasilar insufficiency. Thigh high teds added for orthostasis  10/28 pt understands that the nausea comes  with her VBI/CVA--trying to work through it the best she can 21. Lethargy: much improved, diet upgraded to D2/honey, BUN reviewed and improved, increase Vyvance to 70mg  daily  22. Decreased appetite: improved, d/c cortrak. Diet upgraded to D2/thins!  23. Decreased hearing left ear: discussed may be due to CN 8 being affected by stroke. Debrox ear drops ordered in case wax buildup is contributing  24. ADHD: Increased Vyvanse to 70mg  daily.   25. Constipation: resolved  10/28 pt now having frequent mushy stools--have held all softeners and laxs  26. Oral irritation/burning: no definite thrush but nystatin started by Dr. Benjie Karvonen-- continue this, ordered magic mouthwash with lidocaine to help. Could be related to thickener but feel this is less likely given that she's been using it for a while. No swelling or s/sx anaphylaxis.   10/28 appears resolved   27. MASD: Gerrhard's butt cream ordered, changed to prn, continue  28. Visual deficits: encouraged outpatient follow-up with ophthalmologist, discussed correlation with type of stroke she had, discussed that left eye appears to be tracking better! Discussed with therapy that steristrips may be removed for therapy  -pt already working on outpt ophtho appt 29. Insomnia: increased trazodone to 75mg  HS  with some improvement  LOS: 19 days A FACE TO FACE EVALUATION WAS PERFORMED  Drema Pry Aftan Vint 11/13/2022, 9:55 AM

## 2022-11-13 NOTE — Progress Notes (Signed)
Inpatient Rehabilitation Discharge Medication Review by a Pharmacist  A complete drug regimen review was completed for this patient to identify any potential clinically significant medication issues.   High Risk Drug Classes Is patient taking? Indication by Medication  Antipsychotic No    Anticoagulant No   Antibiotic No    Opioid Yes Hydrocodone/APAP for pain  Antiplatelet Yes Plavix, CVA  Hypoglycemics/insulin No    Vasoactive Medication No    Chemotherapy No    Other Yes APAP prn pain Synthroid - hypothyroidism Melatonin, Trazodone - sleep Methocarbamol - muscle relaxant Ondansetron - nausea Pantoprazole - GERD Polycarbophil - bowel regimen Rosuvastatin - HLD Nitroglycerin - PRN Chest pain Estrace - hormone replacement Lexapro - mood/anxiety Vyvanase - attention        Type of Medication Issue Identified Description of Issue Recommendation(s)  Drug Interaction(s) (clinically significant)        Duplicate Therapy        Allergy        No Medication Administration End Date        Incorrect Dose        Additional Drug Therapy Needed        Significant med changes from prior encounter (inform family/care partners about these prior to discharge). Calcium Carbonate 500mg  1-2 tabs prn indigestion Cetirizine 10mg  daily prn allergies Vitamin D 1000 units daily Other meds are prn.  Other            Clinically significant medication issues were identified that warrant physician communication and completion of prescribed/recommended actions by midnight of the next day:  No   Time spent performing this drug regimen review (minutes):  20     Joan Robinson Robinson 11/13/2022 9:05 AM

## 2022-11-13 NOTE — Plan of Care (Signed)
  Problem: Ischemic Stroke/TIA Tissue Perfusion: Goal: Complications of ischemic stroke/TIA will be minimized Outcome: Progressing   Problem: Coping: Goal: Will verbalize positive feelings about self Outcome: Progressing   Problem: Education: Goal: Knowledge of secondary prevention will improve (MUST DOCUMENT ALL) Outcome: Progressing   Problem: Education: Goal: Knowledge of patient specific risk factors will improve Loraine Leriche N/A or DELETE if not current risk factor) Outcome: Progressing

## 2022-11-15 ENCOUNTER — Telehealth: Payer: Self-pay

## 2022-11-15 NOTE — Telephone Encounter (Signed)
Transitional Care call--patient    Are you/is patient experiencing any problems since coming home? No Are there any questions regarding any aspect of care? No Are there any questions regarding medications administration/dosing? No Are meds being taken as prescribed? Yes Patient should review meds with caller to confirm Have there been any falls? No Has Home Health been to the house and/or have they contacted you? Yes If not, have you tried to contact them? Can we help you contact them? Are bowels and bladder emptying properly? Yes Are there any unexpected incontinence issues? No If applicable, is patient following bowel/bladder programs? Any fevers, problems with breathing, unexpected pain? No Are there any skin problems or new areas of breakdown? No Has the patient/family member arranged specialty MD follow up (ie cardiology/neurology/renal/surgical/etc)? Yes Can we help arrange? Does the patient need any other services or support that we can help arrange? No Are caregivers following through as expected in assisting the patient? Yes Has the patient quit smoking, drinking alcohol, or using drugs as recommended? Yes  Appointment time 11:40 am, arrive time 11:20 am on 11/22 with Dr. Threasa Heads [phone]

## 2022-11-20 ENCOUNTER — Telehealth: Payer: Self-pay | Admitting: Internal Medicine

## 2022-11-20 NOTE — Telephone Encounter (Signed)
Tell Joan Robinson that her husband made me aware and been following her progress through her chart. Give her my best.  Thanks

## 2022-11-20 NOTE — Telephone Encounter (Signed)
Pt aware.

## 2022-11-20 NOTE — Telephone Encounter (Signed)
Inbound call from patient, states she wanted to advise Dr. Marina Goodell, that in September she had to get a portion of her colon removed. She states She had an obstruction and Dr. Cliffton Asters did the surgery. She just wanted to "keep Dr. Marina Goodell in the loop"  Thank you.

## 2022-11-26 ENCOUNTER — Encounter: Payer: BC Managed Care – PPO | Admitting: Physical Medicine and Rehabilitation

## 2022-11-28 ENCOUNTER — Other Ambulatory Visit: Payer: Self-pay | Admitting: Orthopedic Surgery

## 2022-11-28 DIAGNOSIS — M542 Cervicalgia: Secondary | ICD-10-CM

## 2022-12-07 ENCOUNTER — Encounter
Payer: BC Managed Care – PPO | Attending: Physical Medicine and Rehabilitation | Admitting: Physical Medicine and Rehabilitation

## 2022-12-07 VITALS — BP 123/79 | HR 81 | Ht 66.0 in | Wt 146.0 lb

## 2022-12-07 DIAGNOSIS — G894 Chronic pain syndrome: Secondary | ICD-10-CM | POA: Insufficient documentation

## 2022-12-07 DIAGNOSIS — M545 Low back pain, unspecified: Secondary | ICD-10-CM | POA: Diagnosis present

## 2022-12-07 DIAGNOSIS — R112 Nausea with vomiting, unspecified: Secondary | ICD-10-CM | POA: Insufficient documentation

## 2022-12-07 DIAGNOSIS — R451 Restlessness and agitation: Secondary | ICD-10-CM | POA: Insufficient documentation

## 2022-12-07 DIAGNOSIS — I63532 Cerebral infarction due to unspecified occlusion or stenosis of left posterior cerebral artery: Secondary | ICD-10-CM | POA: Insufficient documentation

## 2022-12-07 DIAGNOSIS — R5383 Other fatigue: Secondary | ICD-10-CM | POA: Insufficient documentation

## 2022-12-07 DIAGNOSIS — H539 Unspecified visual disturbance: Secondary | ICD-10-CM | POA: Insufficient documentation

## 2022-12-07 MED ORDER — ARTIFICIAL TEARS OPHTHALMIC OINT: TOPICAL_OINTMENT | Freq: Two times a day (BID) | OPHTHALMIC | 1 refills | Status: AC

## 2022-12-07 NOTE — Progress Notes (Signed)
Subjective:    Patient ID: Joan Robinson, female    DOB: [birth year], 62 y.o.   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 Enema or suppository use No  History of colostomy No  Incontinent No   BLADDER Normal In and out cath, frequency . Able to self cath  . Bladder incontinence No  Frequent urination No  Leakage with coughing No  Difficulty starting stream No  Incomplete bladder emptying No    Mobility walk with assistance how many minutes can you walk? 2-3 ability to climb steps?  yes do you drive?  no  Function disabled: date disabled . retired I need assistance with the following:  dressing, bathing, toileting, meal prep, household duties, and shopping  Neuro/Psych bowel control problems weakness numbness tingling trouble walking anxiety  Prior Studies Hospital f/u  Physicians involved in your care Hospital f/u   Family History  Problem Relation Age of Onset   Heart disease Mother 68       stents   Stroke Mother    Hyperlipidemia Mother    Hypertension Mother    Other Mother        blood disorder- mgus   Colon polyps Father    Other Father        aorta disection   Aneurysm Father    Hypertension Sister        ?   Arthritis Sister    Other Sister        thyroid   Other Sister        thyroid   Irritable bowel syndrome Daughter    Other Daughter        gastritis   Mental illness Daughter        bipolar and mood disorder   Mental illness Son         bipolar   Colon cancer Paternal Uncle    Social History   Socioeconomic History   Marital status: Married    Spouse name: Not on file   Number of children: 2   Years of education: Not on file   Highest education level: Not on file  Occupational History   Occupation: Accounts Receivable  Tobacco Use   Smoking status: Former    Current packs/day: 0.00    Average packs/day: 1 pack/day for 30.0 years (30.0 ttl pk-yrs)    Types: Cigarettes    Start date: 05/16/1979    Quit date: 05/15/2009    Years since quitting: 13.5   Smokeless tobacco: Never  Vaping Use   Vaping status: Never Used  Substance and Sexual Activity   Alcohol use: No   Drug use: No   Sexual activity: Never  Other Topics Concern   Not on file  Social History Narrative   Not on file   Social Determinants of Health   Financial Resource Strain: Low Risk  (05/29/2022)   Received from [facility], [facility]  Overall Financial Resource Strain (CARDIA)    Difficulty of Paying Living Expenses: Not very hard  Food Insecurity: No Food Insecurity (10/14/2022)   Hunger Vital Sign    Worried About Running Out of Food in the Last Year: Never true    Ran Out of Food in the Last Year: Never true  Transportation Needs: No Transportation Needs (10/14/2022)   PRAPARE - Administrator, Civil Service (Medical): No    Lack of Transportation (Non-Medical): No  Physical Activity: Sufficiently Active (05/29/2022)   Received from [facility], [facility]   Exercise Vital Sign    Days of Exercise per Week: 5 days    Minutes of Exercise per Session: 30 min  Stress: Stress Concern Present (05/29/2022)   Received from [facility], [facility] Occupational Health - Occupational Stress Questionnaire    Feeling of Stress : To some extent  Social Connections: Moderately Integrated (05/29/2022)   Received from [facility], [facility]   Social Network    How would you rate your social  network (family, work, friends)?: Adequate participation with social networks   Past Surgical History:  Procedure Laterality Date   ABDOMINAL HYSTERECTOMY     ABDOMINAL HYSTERECTOMY  01/15/2009   complete   COLONOSCOPY     DILATION AND CURETTAGE OF UTERUS  01/16/1983   EYE SURGERY  03/03/2014   Surgery on both eyes for epiretinal membrane (vitreous peel)   Gated Spect wall motion stress cardiolite  11/05/2001   HIATAL HERNIA REPAIR N/A 07/12/2021   Procedure: LAPAROSCOPY W/ EXTENSIVE FOREGUT DISSECTION; PARTIAL STOMACH REDUCTION; GASTROSTOMY TUBE PLACEMENT; GASTROPEXY;  Surgeon: Luretha Murphy, MD;  Location: [facility];  Service: General;  Laterality: N/A;   LAPAROSCOPIC RIGHT HEMI COLECTOMY Right 10/16/2022   Procedure: LAPAROSCOPIC ASSISTED RIGHT HEMI COLECTOMY;  Surgeon: Andria Meuse, MD;  Location: [facility] OR;  Service: General;  Laterality: Right;   POLYPECTOMY     TUBAL LIGATION  01/15/1993   UTERINE SUSPENSION     mesh   VITRECTOMY Bilateral 03/03/2014   XI ROBOTIC ASSISTED HIATAL HERNIA REPAIR N/A 05/01/2021   Procedure: XI ROBOTIC ASSISTED TYPE III HIATAL HERNIA REPAIR WITH FUNDOPLICATION;  Surgeon: Luretha Murphy, MD;  Location: [facility];  Service: General;  Laterality: N/A;   Past Medical History:  Diagnosis Date   Allergic rhinitis    Anemia 10/10/2011   Anxiety and depression 01/17/2007   Qualifier: Diagnosis of  By: Everardo All MD, Sean A    Arthritis 07/24/2013   Likely inflammatory and following with Dr Maryln Gottron of [facility]  rheumatology   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   BCC (basal cell carcinoma of skin) 06/01/2012   Leg Follows with Dr Margo Aye   Bipolar disorder (HCC) 01/17/2007   Qualifier: Diagnosis of  By: Everardo All MD, Gregary Signs A    Cataract    Diverticulosis    Diverticulosis    Emphysema of lung (HCC)    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   Gallstones    GERD (gastroesophageal reflux disease)    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   Hashimoto's disease     Hyperlipidemia    Hypertension    Hypothyroidism 08/24/2006   Qualifier: Diagnosis of  By: Everardo All MD, Sean A     IBS (irritable bowel syndrome) 07/27/2016   Obesity 11/01/2013   PUD (peptic ulcer disease)    Sleep apnea 04/27/2016   Tobacco abuse    BP 123/79   Pulse 81   Ht 5\' 6"  (1.676 m)  Wt 146 lb (66.2 kg)   SpO2 98%   BMI 23.57 kg/m   Opioid Risk Score:   Fall Risk Score:  `1  Depression screen [facility] 2/9     02/02/2017    2:39 PM 12/23/2015    2:13 PM 08/22/2015   12:37 PM 10/07/2014    1:55 PM 09/28/2014    4:11 PM 04/30/2014    8:43 AM  Depression screen PHQ 2/9  Decreased Interest 0 0 0 0 0 0  Down, Depressed, Hopeless 0 0 0 0 0 1  PHQ - 2 Score 0 0 0 0 0 1     Review of Systems  Neurological:  Positive for weakness and numbness.  Psychiatric/Behavioral:  The patient is nervous/anxious.   All other systems reviewed and are negative.     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  -Discussed benefits of exercise in reducing pain. -Discussed following foods that may reduce pain: 1) Ginger  (especially studied for arthritis)- reduce leukotriene production to decrease inflammation 2) Blueberries- high in phytonutrients that decrease inflammation 3) Salmon- marine omega-3s reduce joint swelling and pain 4) Pumpkin seeds- reduce inflammation 5) dark chocolate- reduces inflammation 6) turmeric- reduces inflammation 7) tart cherries - reduce pain and stiffness 8) extra virgin olive oil - its compound olecanthal helps to block prostaglandins  9) chili peppers- can be eaten or applied topically via capsaicin 10) mint- helpful for headache, muscle aches, joint pain, and itching 11) garlic- reduces inflammation  Link to further information on diet for chronic pain: [email or web address]   6) Fatigue: -discussed likely stroke related

## 2022-12-11 ENCOUNTER — Ambulatory Visit (INDEPENDENT_AMBULATORY_CARE_PROVIDER_SITE_OTHER): Payer: BC Managed Care – PPO | Admitting: Neurology

## 2022-12-11 ENCOUNTER — Encounter: Payer: Self-pay | Admitting: Neurology

## 2022-12-11 VITALS — BP 113/72 | HR 107 | Ht 66.0 in | Wt 147.0 lb

## 2022-12-11 DIAGNOSIS — G510 Bell's palsy: Secondary | ICD-10-CM | POA: Diagnosis not present

## 2022-12-11 DIAGNOSIS — I639 Cerebral infarction, unspecified: Secondary | ICD-10-CM | POA: Diagnosis not present

## 2022-12-11 DIAGNOSIS — G119 Hereditary ataxia, unspecified: Secondary | ICD-10-CM | POA: Diagnosis not present

## 2022-12-11 DIAGNOSIS — R1310 Dysphagia, unspecified: Secondary | ICD-10-CM | POA: Diagnosis not present

## 2022-12-11 NOTE — Patient Instructions (Signed)
I had a long d/w patient and her daughter and sister about her  recent cerebellar and brainstem stroke, risk for recurrent stroke/TIAs, personally independently reviewed imaging studies and stroke evaluation results and answered questions.Continue Plavix 75 mg daily for secondary stroke prevention and maintain strict control of hypertension with blood pressure goal below 130/90, diabetes with hemoglobin A1c goal below 6.5% and lipids with LDL cholesterol goal below 70 mg/dL. I also advised the patient to eat a healthy diet with plenty of whole grains, cereals, fruits and vegetables, exercise regularly and maintain ideal body weight.  Continue ongoing home therapies and referred for outpatient physical, occupational and speech therapies.  Check 30-day heart monitor for paroxysmal A-fib.  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  Patient is significantly disabled from her stroke and will not be able to return to work anytime soon.  Followup in the future with me in 3 months or call earlier if necessary  Stroke Prevention Some medical conditions and behaviors can lead to a higher chance of having a stroke. You can help prevent a stroke by eating healthy, exercising, not smoking, and managing any medical conditions you have. Stroke is a leading cause of functional impairment. Primary prevention is particularly important because a majority of strokes are first-time events. Stroke changes the lives of not only those who experience a stroke but also their family and other caregivers. How can this condition affect me? A stroke is a medical emergency and should be treated right away. A stroke can lead to brain damage and can sometimes be life-threatening. If a person gets medical treatment right away, there is a better chance of surviving and recovering from a stroke. What can increase my risk? The following  medical conditions may increase your risk of a stroke: Cardiovascular disease. High blood pressure (hypertension). Diabetes. High cholesterol. Sickle cell disease. Blood clotting disorders (hypercoagulable state). Obesity. Sleep disorders (obstructive sleep apnea). Other risk factors include: Being older than age 60. Having a history of blood clots, stroke, or mini-stroke (transient ischemic attack, TIA). Genetic factors, such as race, ethnicity, or a family history of stroke. Smoking cigarettes or using other tobacco products. Taking birth control pills, especially if you also use tobacco. Heavy use of alcohol or drugs, especially cocaine and methamphetamine. Physical inactivity. What actions can I take to prevent this? Manage your health conditions High cholesterol levels. Eating a healthy diet is important for preventing high cholesterol. If cholesterol cannot be managed through diet alone, you may need to take medicines. Take any prescribed medicines to control your cholesterol as told by your health care provider. Hypertension. To reduce your risk of stroke, try to keep your blood pressure below 130/80. Eating a healthy diet and exercising regularly are important for controlling blood pressure. If these steps are not enough to manage your blood pressure, you may need to take medicines. Take any prescribed medicines to control hypertension as told by your health care provider. Ask your health care provider if you should monitor your blood pressure at home. Have your blood pressure checked every year, even if your blood pressure is normal. Blood pressure increases with age and some medical conditions. Diabetes. Eating a healthy diet and exercising regularly are important parts of managing your blood sugar (glucose). If your blood sugar cannot be managed through diet and exercise, you may need to take medicines. Take any prescribed medicines to control your diabetes as told by your  health care provider. Get evaluated for obstructive sleep apnea. Talk to your health care provider about getting a sleep evaluation if you snore a lot or have excessive sleepiness. Make sure that any other medical conditions you have, such as atrial fibrillation or atherosclerosis, are managed. Nutrition Follow instructions from your health care provider about what to eat or drink to help manage your health condition. These instructions may include: Reducing your daily calorie intake. Limiting how much salt (sodium) you use to 1,500 milligrams (mg) each day. Using only healthy fats for cooking, such as olive oil, canola oil, or sunflower oil. Eating healthy foods. You can do this by: Choosing foods that are high in fiber, such as whole grains, and fresh fruits and vegetables. Eating at least 5 servings of fruits and vegetables a day. Try to fill one-half of your plate with fruits and vegetables at each meal. Choosing lean protein foods, such as lean cuts of meat, poultry without skin, fish, tofu, beans, and nuts. Eating low-fat dairy products. Avoiding foods that are high in sodium. This can help lower blood pressure. Avoiding foods that have saturated fat, trans fat, and cholesterol. This can help prevent high cholesterol. Avoiding processed and prepared foods. Counting your daily carbohydrate intake.  Lifestyle If you drink alcohol: Limit how much you have to: 0-1 drink a day for women who are not pregnant. 0-2 drinks a day for men. Know how much alcohol is in your drink. In the [place] one drink equals one 12 oz bottle of beer ( ), one 5 oz glass of wine ( ), or one 1 oz glass of hard liquor (44mL). Do not use any products that contain nicotine or tobacco. These products include cigarettes, chewing tobacco, and vaping devices, such as e-cigarettes. If you need help quitting, ask your health care provider. Avoid secondhand smoke. Do not use drugs. Activity  Try to stay at a  healthy weight. Get at least 30 minutes of exercise on most days, such as: Fast walking. Biking. Swimming. Medicines Take over-the-counter and prescription medicines only as told by your health care provider. Aspirin or blood thinners (antiplatelets or anticoagulants) may be recommended to reduce your risk of forming blood clots that can lead to stroke. Avoid taking birth control pills. Talk to your health care provider about the risks of taking birth control pills if: You are over 58 years old. You smoke. You get very bad headaches. You have had a blood clot. Where to find more information American Stroke Association: www.strokeassociation.org Get help right away if: You or a loved one has any symptoms of a stroke. "BE FAST" is an easy way to remember the main warning signs of a stroke: B - Balance. Signs are dizziness, sudden trouble walking, or loss of balance. E - Eyes. Signs are trouble seeing or a sudden change in vision. F - Face. Signs are sudden weakness or numbness of the face, or the face or eyelid drooping on one side. A - Arms. Signs are weakness or numbness in an arm. This happens suddenly and usually on one side of the body. S - Speech. Signs are sudden trouble speaking, slurred speech, or trouble understanding what people say. T - Time. Time to call emergency services. Write down what time symptoms started. You or a loved one has other signs of a stroke, such as: A sudden, severe headache with no known cause. Nausea or vomiting. Seizure. These symptoms may represent a serious problem that is an emergency. Do not wait to see if  the symptoms will go away. Get medical help right away. Call your local emergency services (911 in the [place] Do not drive yourself to the hospital. Summary You can help to prevent a stroke by eating healthy, exercising, not smoking, limiting alcohol intake, and managing any medical conditions you may have. Do not use any products that contain  nicotine or tobacco. These include cigarettes, chewing tobacco, and vaping devices, such as e-cigarettes. If you need help quitting, ask your health care provider. Remember "BE FAST" for warning signs of a stroke. Get help right away if you or a loved one has any of these signs. This information is not intended to replace advice given to you by your health care provider. Make sure you discuss any questions you have with your health care provider. Document Revised: 12/04/2021 Document Reviewed: 12/04/2021 Elsevier Patient Education  2024 [employer].

## 2022-12-11 NOTE — Progress Notes (Signed)
[facility] [address] [place]. [place] [address] [phone]       OFFICE CONSULT NOTE  Joan Robinson Date of Birth:  01/13/1961 Medical Record Number:  [id number]   Referring MD: Wendi Maya, PA-C  Reason for Referral: Stroke  HPI: Joan Robinson is a 62 year old pleasant Caucasian lady seen today for initial office consultation visit for stroke.  She is accompanied by her sister and daughter and history is obtained from them and review of electronic medical records.  I personally reviewed pertinent available imaging films in PACS.  She is a  62 y.o. female with past medical history of esophageal strictures status post dilation, multiple hernias status post multiple surgeries, GERD/PUD, hypertension, hyperlipidemia, autoimmune urticaria, emphysema, sleep apnea who presents to the ED on 10/13/2022 with right upper quadrant and right lower quadrant abdominal pain x 2 days associated nausea.  She was found to have small bowel obstruction and was being managed inpatient at [facility].  Around shift change, patient was noted to have left facial droop, slurring of her speech and upon further questioning, symptoms were first noted at 1700 on 10/14/2022.  Her team had difficulty establishing a last known well.  However, symptoms were first noted at 1700.  A code stroke was activated if she had a CT head without contrast which was notable for a moderately large left PICA territory infarct.  She was eval by teleneurology.  She was not given TNKase due to unclear last known well and the fact that the stroke looks completed.  CT angio head and neck with and without contrast was negative for any large vessel occlusion.  Specifically, no basilar occlusion or thrombosis noted.  CT perfusion with no core or mismatch noted.  Given the moderately large posterior fossa stroke which is in proximity to brainstem, and the extension of the stroke into the brainstem, she was transferred to  [facility] medical ICU for immediate in person neurology evaluation and for close monitoring for signs of posterior fossa crowding and potential herniation.  She was monitored carefully in the ICU and remained stable and did not require any neurosurgical intervention.  MRI showed a large acute infarct involving both left PICA and Icar territory with mass effect on posterior fossa and fourth ventricle mild hydrocephalus.  2D echo showed ejection fraction of 60 to 65%.  Left atrial size was normal.  LDL cholesterol 54 mg percent.  Hemoglobin A1c was 5.6.  Patient had significant left peripheral facial nerve weakness as well as dysphagia and left hemiataxia.  She was seen by physical Occupational Therapy transfer to inpatient rehab where she stayed for a month.  She made gradual improvement in his been home now for nearly 4 weeks.  She still has significant left facial weakness with dryness of the left eye with redness.  She is unable to close her left eye.  She does have an upcoming appointment to see ophthalmologist next week.  She is still not gotten any hearing back in the left ear.  She still has trouble swallowing has to swallow mostly on the right side.  She has a lot of drooling from the left corner of the mouth.  She still has some left-sided incoordination and she is working with therapist has shown some improvement.  She is still unable to walk on her own and requires 1 person assist with gait belt to walk with a wheelchair.  She is tolerating Plavix with minor bruising but no bleeding.  Patient  is clearly significantly disabled and is currently on short-term disability and thinking about going on a long disability.  Patient also has a new complaint of neck pain and right arm numbness.  Her primary care physician has ordered an MRI of the C-spine is scheduled for next week for this.  Last CT head on 10/28/2022 had shown decrease mass effect in the posterior fossa with improved patency of the fourth  ventricle.  ROS:   14 system review of systems is positive for facial nerve weakness, drooling, numbness, difficulty swallowing, imbalance, weakness, gait difficulty, decreased hearing all other systems negative  PMH:  Past Medical History:  Diagnosis Date   Allergic rhinitis    Anemia 10/10/2011   Anxiety and depression 01/17/2007   Qualifier: Diagnosis of  By: Everardo All MD, Sean A    Arthritis 07/24/2013   Likely inflammatory and following with Dr Maryln Gottron of [facility]  rheumatology   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   BCC (basal cell carcinoma of skin) 06/01/2012   Leg Follows with Dr Margo Aye   Bipolar disorder (HCC) 01/17/2007   Qualifier: Diagnosis of  By: Everardo All MD, Sean A    Cataract    Diverticulosis    Diverticulosis    Emphysema of lung (HCC)    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   Gallstones    GERD (gastroesophageal reflux disease)    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   Hashimoto's disease    Hyperlipidemia    Hypertension    Hypothyroidism 08/24/2006   Qualifier: Diagnosis of  By: Everardo All MD, Sean A     IBS (irritable bowel syndrome) 07/27/2016   Obesity 11/01/2013   PUD (peptic ulcer disease)    Sleep apnea 04/27/2016   Tobacco abuse     Social History:  Social History   Socioeconomic History   Marital status: Married    Spouse name: Not on file   Number of children: 2   Years of education: Not on file   Highest education level: Not on file  Occupational History   Occupation: Accounts Receivable  Tobacco Use   Smoking status: Former    Current packs/day: 0.00    Average packs/day: 1 pack/day for 30.0 years (30.0 ttl pk-yrs)    Types: Cigarettes    Start date: 05/16/1979    Quit date: 05/15/2009    Years since quitting: 13.5   Smokeless tobacco: Never  Vaping Use   Vaping status: Never Used  Substance and Sexual Activity   Alcohol use: No   Drug use: No   Sexual activity: Never  Other Topics Concern   Not on file  Social History Narrative   Not on file    Social Determinants of Health   Financial Resource Strain: Low Risk  (05/29/2022)   Received from [facility], [facility]   Overall Financial Resource Strain (CARDIA)    Difficulty of Paying Living Expenses: Not very hard  Food Insecurity: No Food Insecurity (10/14/2022)   Hunger Vital Sign    Worried About Running Out of Food in the Last Year: Never true    Ran Out of Food in the Last Year: Never true  Transportation Needs: No Transportation Needs (10/14/2022)   PRAPARE - Administrator, Civil Service (Medical): No    Lack of Transportation (Non-Medical): No  Physical Activity: Sufficiently Active (05/29/2022)   Received from [facility], [facility]   Exercise Vital Sign    Days of Exercise per Week: 5 days    Minutes of Exercise per Session:  30 min  Stress: Stress Concern Present (05/29/2022)   Received from [facility], [facility] - Occupational Stress Questionnaire    Feeling of Stress : To some extent  Social Connections: Moderately Integrated (05/29/2022)   Received from [facility], [facility]   Social Network    How would you rate your social network (family, work, friends)?: Adequate participation with social networks  Intimate Partner Violence: Not At Risk (10/14/2022)   Humiliation, Afraid, Rape, and Kick questionnaire    Fear of Current or Ex-Partner: No    Emotionally Abused: No    Physically Abused: No    Sexually Abused: No    Medications:   Current Outpatient Medications on File Prior to Visit  Medication Sig Dispense Refill   acetaminophen (TYLENOL) 325 MG tablet Take 1-2 tablets (325-650 mg total) by mouth every 4 (four) hours as needed for mild pain (pain score 1-3).     artificial tears (LACRILUBE) OINT ophthalmic ointment Place into the left eye 2 (two) times daily. 3.5 g 1   Cholecalciferol (D 1000) 25 MCG (1000 UT) capsule Take 1,000 Units by mouth daily.     clopidogrel (PLAVIX) 75  MG tablet Take 1 tablet (75 mg total) by mouth daily. 30 tablet 0   escitalopram (LEXAPRO) 10 MG tablet Take 10 mg by mouth daily.     estradiol (ESTRACE) 0.1 MG/GM vaginal cream Place 1 Applicatorful vaginally 2 (two) times a week.     HYDROcodone-acetaminophen (NORCO) 10-325 MG tablet Take 1 tablet by mouth every 8 (eight) hours as needed.     latanoprost (XALATAN) 0.005 % ophthalmic solution Place 1 drop into both eyes at bedtime.     lisdexamfetamine (VYVANSE) 70 MG capsule Take 70 mg by mouth daily.     nitroGLYCERIN (NITROSTAT) 0.4 MG SL tablet Place 0.4 mg under the tongue every 5 (five) minutes as needed for chest pain.     ondansetron (ZOFRAN) 4 MG tablet Take 1 tablet (4 mg total) by mouth every 8 (eight) hours as needed for nausea or vomiting. Take 1-2 tablets every 4-6 hours as needed for nausea (Patient taking differently: Take 4 mg by mouth every 8 (eight) hours as needed for nausea or vomiting.) 30 tablet 2   pantoprazole (PROTONIX) 40 MG tablet Take 1 tablet (40 mg total) by mouth daily. 90 tablet 3   rosuvastatin (CRESTOR) 20 MG tablet Take 1 tablet (20 mg total) by mouth daily. 90 tablet 3   SYNTHROID 88 MCG tablet Take 88 mcg by mouth daily before breakfast.     traZODone (DESYREL) 50 MG tablet Take 1 tablet (50 mg total) by mouth daily. (Patient taking differently: Take 75 mg by mouth daily.)     cetirizine (ZYRTEC) 10 MG tablet Take 10 mg by mouth daily as needed for allergies.     melatonin 10 MG TABS Take 10 mg by mouth at bedtime as needed.     polyvinyl alcohol (LIQUIFILM TEARS) 1.4 % ophthalmic solution Place 2 drops into both eyes 4 (four) times daily. 15 mL 0   No current facility-administered medications on file prior to visit.    Allergies:   Allergies  Allergen Reactions   Aspirin Shortness Of Breath   Nsaids Shortness Of Breath   Keflex [Cephalexin] Diarrhea   Butamben-Tetracaine-Benzocaine     Mouth sores   Dicyclomine Hcl     REACTION: mouth ulcers    Metronidazole Hives    mouth ulcers  Phenergan [Promethazine Hcl] Other (See Comments)    "knocks" pt out for about 3 days   Quetiapine     Somnolence. Slept for 36 hours straight.   Benay Spice [Lifitegrast]     Burned eyes   Cymbalta [Duloxetine Hcl] Rash   Levothyroxine Palpitations    Pt must use brand SYNTHROID   Moxifloxacin Nausea Only and Palpitations    REACTION: increased heart rate   Moxifloxacin Hcl In Nacl Palpitations   Orphenadrine Citrate Palpitations   Sulfamethoxazole-Trimethoprim Nausea Only and Palpitations    REACTION: increased heart rate, nausea    Physical Exam General: well developed, well nourished pleasant middle-age Caucasian lady, seated, in no evident distress Head: head normocephalic and atraumatic.   Neck: supple with no carotid or supraclavicular bruits Cardiovascular: regular rate and rhythm, no murmurs Musculoskeletal: no deformity Skin:  no rash/petichiae Vascular:  Normal pulses all extremities Redness of the left eye with slight corneal haziness. Neurologic Exam Mental Status: Awake and fully alert. Oriented to place and time. Recent and remote memory intact. Attention span, concentration and fund of knowledge appropriate. Mood and affect appropriate.  Cranial Nerves: Fundoscopic exam deferred pupils equal, briskly reactive to light. Extraocular movements full without nystagmus. Visual fields full to confrontation. Hearing loss in left ear.  Facial sensation diminished on the right face.  Severe left peripheral pattern 7th nerve weakness including forehead eyelids and lower face., tongue, palate moves normally and symmetrically.  Motor: Normal bulk and tone. Normal strength in all tested extremity muscles. Sensory.:  Diminished sensation in the right hand.  Normal elsewhere. Coordination: Mild left hemiataxia arm greater than leg.. Gait and Station: Deferred as patient is unable even with 1 person assist.  Reflexes: 1+ and symmetric. Toes  downgoing.   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  She has significant residual left peripheral nerve palsy, hearing loss, dysphagia and left hemiataxia.     PLAN:I had a long d/w patient and her daughter and sister about her  recent cerebellar and brainstem stroke, risk for recurrent stroke/TIAs, personally independently reviewed imaging studies and stroke evaluation results and answered questions.Continue Plavix 75 mg daily for secondary stroke prevention and maintain strict control of hypertension with blood pressure goal below 130/90, diabetes with hemoglobin A1c goal below 6.5% and lipids with LDL cholesterol goal below 70 mg/dL. I also advised the patient to eat a healthy diet with plenty of whole grains, cereals, fruits and vegetables, exercise regularly and maintain ideal body weight.  Continue ongoing home therapies and referred for outpatient physical, occupational and speech therapies.  Check 30-day heart monitor for paroxysmal A-fib.  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  Patient is significantly disabled from her stroke and will not be able to return to work anytime soon.  Followup in the future with me in 3 months or call earlier if necessary Greater than 50% time during this 45-minute consultation visit was spent in counseling and coordination of care about the stroke and significant vessel deficits and discussion with patient and family and answering questions. Delia Heady, MD Note: This document was prepared with digital dictation and possible smart phrase technology. Any transcriptional errors that result from this process are unintentional.

## 2022-12-12 DIAGNOSIS — Z0289 Encounter for other administrative examinations: Secondary | ICD-10-CM

## 2022-12-17 ENCOUNTER — Telehealth: Payer: Self-pay | Admitting: *Deleted

## 2022-12-17 NOTE — Telephone Encounter (Signed)
No update r/c forms on 12/13/2022

## 2022-12-19 ENCOUNTER — Telehealth: Payer: Self-pay

## 2022-12-19 NOTE — Telephone Encounter (Signed)
Paperwork completed and placed in MD office for review and signature

## 2022-12-19 NOTE — Telephone Encounter (Signed)
In error

## 2022-12-24 NOTE — Telephone Encounter (Signed)
Paperwork still in office to be reviewed and signed. Will placed in MR once received back

## 2022-12-24 NOTE — Telephone Encounter (Signed)
Pt has called to f/u on the status of her paperwork being signed off on by Dr Pearlean Brownie.  Pt states this is time sensitive and needs to be completed so she is able to receive pay from employer.  Pt is asking to be notified when this is done and she will send brother to office to pick up the forms for her.

## 2022-12-25 ENCOUNTER — Telehealth: Payer: Self-pay | Admitting: Neurology

## 2022-12-25 ENCOUNTER — Inpatient Hospital Stay
Admission: RE | Admit: 2022-12-25 | Discharge: 2022-12-25 | Discharge status: Home / Self Care | Payer: BC Managed Care – PPO | Source: Ambulatory Visit | Attending: Orthopedic Surgery | Admitting: Orthopedic Surgery

## 2022-12-25 DIAGNOSIS — I639 Cerebral infarction, unspecified: Secondary | ICD-10-CM

## 2022-12-25 DIAGNOSIS — M542 Cervicalgia: Secondary | ICD-10-CM

## 2022-12-25 NOTE — Telephone Encounter (Signed)
Spoke  to patient informed pt resent order today . Heart Monitor will be mailed to patient and if pt doesn't get by Friday she can call [facility] on [address] to check on heart monitor. Pt expressed understanding and thanked me for calling

## 2022-12-25 NOTE — Telephone Encounter (Signed)
Pt said last office visit Dr. Pearlean Brownie discussed ordering heart monitor to wear for 30 days to check Afib. Have not heard from anyone  or received the monitor. Would like a call back.

## 2022-12-26 ENCOUNTER — Telehealth: Payer: Self-pay | Admitting: *Deleted

## 2022-12-26 ENCOUNTER — Other Ambulatory Visit: Payer: Self-pay | Admitting: Neurology

## 2022-12-26 DIAGNOSIS — I480 Paroxysmal atrial fibrillation: Secondary | ICD-10-CM

## 2022-12-26 DIAGNOSIS — I491 Atrial premature depolarization: Secondary | ICD-10-CM

## 2022-12-26 DIAGNOSIS — I639 Cerebral infarction, unspecified: Secondary | ICD-10-CM

## 2022-12-26 NOTE — Telephone Encounter (Signed)
Pt forms faxed back on 12/25/2022 pt copy @ the front desk.

## 2022-12-31 ENCOUNTER — Encounter: Payer: Self-pay | Admitting: Physical Therapy

## 2022-12-31 ENCOUNTER — Ambulatory Visit: Payer: BC Managed Care – PPO | Attending: Neurology | Admitting: Occupational Therapy

## 2022-12-31 ENCOUNTER — Ambulatory Visit: Payer: BC Managed Care – PPO

## 2022-12-31 ENCOUNTER — Ambulatory Visit: Payer: BC Managed Care – PPO | Admitting: Physical Therapy

## 2022-12-31 ENCOUNTER — Other Ambulatory Visit: Payer: Self-pay

## 2022-12-31 DIAGNOSIS — M6281 Muscle weakness (generalized): Secondary | ICD-10-CM | POA: Diagnosis present

## 2022-12-31 DIAGNOSIS — R2681 Unsteadiness on feet: Secondary | ICD-10-CM

## 2022-12-31 DIAGNOSIS — I69354 Hemiplegia and hemiparesis following cerebral infarction affecting left non-dominant side: Secondary | ICD-10-CM

## 2022-12-31 DIAGNOSIS — R208 Other disturbances of skin sensation: Secondary | ICD-10-CM | POA: Diagnosis present

## 2022-12-31 DIAGNOSIS — R471 Dysarthria and anarthria: Secondary | ICD-10-CM | POA: Insufficient documentation

## 2022-12-31 DIAGNOSIS — R278 Other lack of coordination: Secondary | ICD-10-CM

## 2022-12-31 DIAGNOSIS — R41842 Visuospatial deficit: Secondary | ICD-10-CM | POA: Diagnosis present

## 2022-12-31 DIAGNOSIS — R2689 Other abnormalities of gait and mobility: Secondary | ICD-10-CM | POA: Insufficient documentation

## 2022-12-31 DIAGNOSIS — R1312 Dysphagia, oropharyngeal phase: Secondary | ICD-10-CM | POA: Diagnosis present

## 2022-12-31 DIAGNOSIS — I639 Cerebral infarction, unspecified: Secondary | ICD-10-CM | POA: Insufficient documentation

## 2022-12-31 NOTE — Therapy (Signed)
OUTPATIENT SPEECH LANGUAGE PATHOLOGY EVALUATION   Patient Name: Joan Robinson MRN: [id number] DOB:Dec 24, 1960, 62 y.o., female Today's Date: 12/31/2022  PCP: Kathlen Brunswick, MD REFERRING PROVIDER:  Micki Riley, MD     END OF SESSION:  End of Session - 12/31/22 1757     Visit Number 1    Number of Visits 17    Date for SLP Re-Evaluation 03/01/23    SLP Start Time 1448    SLP Stop Time  1530    SLP Time Calculation (min) 42 min    Activity Tolerance Patient tolerated treatment well             Past Medical History:  Diagnosis Date   Allergic rhinitis    Anemia 10/10/2011   Anxiety and depression 01/17/2007   Qualifier: Diagnosis of  By: Everardo All MD, Sean A    Arthritis 07/24/2013   Likely inflammatory and following with Dr Maryln Gottron of [facility]  rheumatology   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   BCC (basal cell carcinoma of skin) 06/01/2012   Leg Follows with Dr Margo Aye   Bipolar disorder (HCC) 01/17/2007   Qualifier: Diagnosis of  By: Everardo All MD, Sean A    Cataract    Diverticulosis    Diverticulosis    Emphysema of lung (HCC)    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   Gallstones    GERD (gastroesophageal reflux disease)    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   Hashimoto's disease    Hyperlipidemia    Hypertension    Hypothyroidism 08/24/2006   Qualifier: Diagnosis of  By: Everardo All MD, Sean A     IBS (irritable bowel syndrome) 07/27/2016   Obesity 11/01/2013   PUD (peptic ulcer disease)    Sleep apnea 04/27/2016   Tobacco abuse    Past Surgical History:  Procedure Laterality Date   ABDOMINAL HYSTERECTOMY     ABDOMINAL HYSTERECTOMY  01/15/2009   complete   COLONOSCOPY     DILATION AND CURETTAGE OF UTERUS  01/16/1983   EYE SURGERY  03/03/2014   Surgery on both eyes for epiretinal membrane (vitreous peel)   Gated Spect wall motion stress cardiolite  11/05/2001   HIATAL HERNIA REPAIR N/A 07/12/2021   Procedure: LAPAROSCOPY W/ EXTENSIVE FOREGUT DISSECTION; PARTIAL  STOMACH REDUCTION; GASTROSTOMY TUBE PLACEMENT; GASTROPEXY;  Surgeon: Luretha Murphy, MD;  Location: [facility];  Service: General;  Laterality: N/A;   LAPAROSCOPIC RIGHT HEMI COLECTOMY Right 10/16/2022   Procedure: LAPAROSCOPIC ASSISTED RIGHT HEMI COLECTOMY;  Surgeon: Andria Meuse, MD;  Location: [facility];  Service: General;  Laterality: Right;   POLYPECTOMY     TUBAL LIGATION  01/15/1993   UTERINE SUSPENSION     mesh   VITRECTOMY Bilateral 03/03/2014   XI ROBOTIC ASSISTED HIATAL HERNIA REPAIR N/A 05/01/2021   Procedure: XI ROBOTIC ASSISTED TYPE III HIATAL HERNIA REPAIR WITH FUNDOPLICATION;  Surgeon: Luretha Murphy, MD;  Location: [facility];  Service: General;  Laterality: N/A;   Patient Active Problem List   Diagnosis Date Noted   Dysphagia 11/05/2022   Vestibular dizziness 11/05/2022   Malnutrition of moderate degree 11/01/2022   Acute stroke due to ischemia [facility]) 10/25/2022   CVA (cerebral vascular accident) (HCC) 10/25/2022   Protein-calorie malnutrition, severe 10/19/2022   Diverticulosis 10/14/2022   History of emphysema (HCC) 10/14/2022   SBO (small bowel obstruction) (HCC) 10/13/2022   Aortic valve calcification 09/20/2022   Abdominal aortic atherosclerosis (HCC) 09/20/2022   Hiatal hernia 07/12/2021   Status post laparoscopic Nissen fundoplication 05/01/2021   IBS (irritable  bowel syndrome) 07/27/2016   High-tone pelvic floor dysfunction 05/09/2016   Midline cystocele 05/09/2016   Vaginal atrophy 05/09/2016   Bladder prolapse, female, acquired 05/07/2016   Dystrophic nail 04/29/2016   Fatigue 04/27/2016   Sleep apnea 04/27/2016   UTI (urinary tract infection) 09/11/2015   Cataract cortical, senile, left 12/20/2014   Rectal lesion 10/10/2014   Other type of migraine without status migrainosus 05/06/2014   Dyspepsia 05/06/2014   Postmenopausal estrogen deficiency 05/06/2014   Screening for breast cancer 05/06/2014   Bilateral low-tension glaucoma, indeterminate stage  12/29/2013   High myopia, bilateral 12/29/2013   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   Obesity 11/01/2013   Eustachian tube dysfunction 10/08/2013   Otalgia of left ear 10/08/2013   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   Arthritis 07/24/2013   Anxiety attack 03/08/2013   Paresthesia 12/22/2012   Headache 08/17/2012   BCC (basal cell carcinoma of skin) 06/01/2012   Left arm pain 05/10/2012   Right knee pain 05/10/2012   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   ADD (attention deficit disorder) 04/13/2012   Perimenopausal 01/16/2012   Macular pucker of both eyes 10/18/2011   Vitreous degeneration 10/18/2011   Chronic anemia 10/10/2011   Preventative health care 10/10/2011   Urticaria, chronic 10/10/2011   Macular pucker, bilateral 10/10/2011   Lower back pain    DIVERTICULITIS-COLON 07/25/2009   Diverticulitis of colon 07/25/2009   Hyperlipidemia 06/09/2009   Essential hypertension 06/09/2009   History of esophageal stricture 09/06/2008   GERD (gastroesophageal reflux disease) 07/27/2008   Bipolar disorder (HCC) 01/17/2007   Hypothyroidism 08/24/2006   Allergic rhinitis 08/24/2006   URINARY INCONTINENCE 08/24/2006    ONSET DATE: September 2024   REFERRING DIAG:  I63.9 (ICD-10-CM) - Brainstem stroke (HCC)    THERAPY DIAG:  Dysarthria and anarthria  Dysphagia, oropharyngeal phase  Rationale for Evaluation and Treatment: Rehabilitation  SUBJECTIVE:   SUBJECTIVE STATEMENT: I was d/c'd on 10-29/24 and then I got theraipsts at my house."  Pt accompanied by: self  PERTINENT HISTORY: 62 yo admitted 9/28 with RUQ pain with SBO. 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 PMhx: HTN, HLD, peptic ulcer disease, diverticulitis, bipolar disorder, hypothyroidism, GERD, Hashimoto's disease. Hemicolectomy completed on 10/1.  PAIN:  Are you having pain? RUE pain - see OT eval     FALLS: Has patient fallen in last 6 months?  No  LIVING ENVIRONMENT: Lives with: lives with their spouse Lives in: House/apartment  PLOF:  Level of assistance: Independent with ADLs, Independent with IADLs Employment: Retired  PATIENT GOALS: Improve speech and swallowing skills  OBJECTIVE:  Note: Objective measures were completed at Evaluation unless otherwise noted.  DIAGNOSTIC FINDINGS:  MRI 10/15/22 IMPRESSION: Large acute infarct (affecting both the left PICA and left AICA vascular territories) as described. Posterior fossa mass effect with effacement of the fourth ventricle inferiorly. No evidence of obstructive hydrocephalus at this time. Mild inferior displacement of the cerebellar tonsils (which extend up to 4 mm below the level of the foramen magnum). No more than mild mass effect upon the medulla at this time. Close imaging follow-up is recommended to monitor the evolving posterior fossa mass effect.   COGNITION: Overall cognitive status: Within functional limits for tasks assessed   ORAL MOTOR EXAMINATION: Overall status: Impaired: Labial: Left (ROM, Symmetry, Strength, Sensation, and Coordination) Facial: Left (ROM, Symmetry, Strength, Sensation, and Coordination) Mandible: ROM, Sensation, and Coordination  Comments: pt with absent facial movement and  tone on lt.  MOTOR SPEECH: Overall motor speech: impaired Level of impairment: Word, Phrase, Sentence, and Conversation Respiration: thoracic breathing and diaphragmatic/abdominal breathing Phonation: normal Resonance: WFL Articulation: Impaired: word, phrase, sentence, and conversation Intelligibility: Intelligible Motor planning: Appears intact Motor speech errors: aware and consistent Interfering components: anatomical limitations Effective technique: slow rate and over articulate   RECOMMENDATIONS FROM OBJECTIVE SWALLOW STUDY (MBSS/FEES):   Swallowing Evaluation Recommendations  (11/05/22) Dysphagia 2 (Finely chopped); Thin liquids (Level 0) Liquid Administration via: Cup; Spoon; Straw Medication Administration: Crushed with puree Supervision: Full supervision/cueing for swallowing strategies; Patient able to self-feed Objective swallow impairments: Clinical Impression: Pt presents with moderate oral and mild pharyngeal dysphagia. Oral phase characterized by reduced labial seal (resulting in anterior bolus escape beyond the mid chin with thin liquids from teaspoon), impaired/slowed mastication, slowed AP transit, and mild-moderate oral residue after the initial swallow. Patient sensate to residue and works to clear independently. Pharyngeally, deficits characterized by reduced pharyngeal constriction and reduced BOT retraction to the posterior pharyngeal wall resulting in trace-mild residue in the vallecula with all trialed consistencies. Both orally and pharyngeally, amount of residue increases alongside bolus viscosity. Patient continues to present with trismus that makes oral acceptance of larger bites of food difficult, though this is improving. No significant penetration/aspiration observed throughout all trials. Straw bolus acceptance minimized anterior bolus loss and adding an additional dry swallow after solids mitigated residue in the vallecula. Recommend diet upgrade to Dys2/thin liquid diet, adding additional swallow intermittently to clear residue. Family updated to results and in agreement with plan. Objective recommended compensations:  Swallowing strategies: Slow rate; Check for pocketing or oral holding; Check for anterior loss; Multiple dry swallows after each bite/sip Postural changes: Position pt fully upright for meals; Stay upright 30-60 min after meals Oral care recommendations: Oral care BID (2x/day)  CLINICAL SWALLOW ASSESSMENT:   Current diet: Dysphagia 2 (chopped/minced) and thin liquids Dentition: missing dentition"I have parts to go in there but I  haven't put them in since the stroke." Patient directly observed with POs: Yes: dysphagia 1 (puree) and thin liquids  Feeding: able to feed self Liquids provided by: straw Oral phase signs and symptoms: left pocketing and difficulty inserting spoon due to limited labial opening and mandibular opening  Pharyngeal phase signs and symptoms:  none noted with dys I and II Comments: Pt has been having more liquids than solids at this time. Spring states that she feels her mouth does not open as wide as it did before; SLP suspects maybe some damage to CN V, which is motor innervation for mandible. Pt did not take multiple swallows after bites or sips. It has been approx 8 weeks since MBS - pt will require updated study.  PATIENT REPORTED OUTCOME MEASURES (PROM): EAT-10:   and Speech - QOL measures provided during first therapy session   TODAY'S TREATMENT:  DATE:  12/31/22 (eval): SLP educated/reviewed three labial exercises with pt and provided handout. SLP role in rehab process was explained. Reviewed pt's swallow strategies with her.  PATIENT EDUCATION: Education details: see "today's treatment" Person educated: Patient Education method: Explanation, Demonstration, Verbal cues, and Handouts Education comprehension: verbalized understanding, returned demonstration, verbal cues required, and needs further education   GOALS: Goals reviewed with patient? No  SHORT TERM GOALS: Target date: 02/01/23  Pt will perform dysarthria HEP, and dysphagia (if necessary) HEP with rare min A in two sessions Baseline: Goal status: INITIAL  2.  Pt will perform MBS to assess current swallow status Baseline:  Goal status: INITIAL  3.  Pt will follow swallow precautions from MBS with POs with rare min A in two sessions Baseline:  Goal status: INITIAL  4.  Pt will improve  articulation in 10 minutes conversation with compensatory strategies with rare min A Baseline:  Goal status: INITIAL   LONG TERM GOALS: Target date: 03/01/23  Pt will improve PROM compared to initial administration Baseline:  Goal status: INITIAL  2.  Pt will improve articulation in 10 minutes conversation using compensatory strategies Baseline:  Goal status: INITIAL  3.  Pt will perform dysarthria HEP, and dysphagia (if necessary) HEP with modified independence in three sessions Baseline:  Goal status: INITIAL  4.  Pt will follow swallow precautions from MBS with POs with modified independence in 3 sessions Baseline:  Goal status: INITIAL   ASSESSMENT:  CLINICAL IMPRESSION: Patient is a 62 y.o. F who was seen today for assessment of swallowing and speech after a CVA in October 2024. Pt had HHST for approx 3-4 sessions at once/week focusing on swallowing and speech. Today SLP provided three labial exercises for pt to complete at home. Pt would benefit from follow up MBS as her previous study was approx 8 weeks ago. Currently pt cont with limited sensation and limited/no labial or facial movement on lt side. Pt is drinking with a straw and reports lt labial sulcus residue which she clears with finger sweep due to lingual movement into sulcus is inefficient at this time.  OBJECTIVE IMPAIRMENTS: include dysarthria and dysphagia. These impairments are limiting patient from ADLs/IADLs, effectively communicating at home and in community, and safety when swallowing. Factors affecting potential to achieve goals and functional outcome are severity of impairments and unknown prognosis for nerve recovery on lt side . Patient will benefit from skilled SLP services to address above impairments and improve overall function.  REHAB POTENTIAL: Good  PLAN:  SLP FREQUENCY: 2x/week  SLP DURATION: 8 weeks  PLANNED INTERVENTIONS: 92526 Treatment of swallowing function, 92507 Treatment of speech (30  or 45 min) , Re-evaluation, Aspiration precaution training, Pharyngeal strengthening exercises, Diet toleration management , Environmental controls, Trials of upgraded texture/liquids, Oral motor exercises, Functional tasks, Multimodal communication approach, SLP instruction and feedback, Compensatory strategies, Patient/family education, and MBS    Chaniya Genter, CCC-SLP 12/31/2022, 5:58 PM

## 2022-12-31 NOTE — Therapy (Signed)
OUTPATIENT OCCUPATIONAL THERAPY NEURO EVALUATION  Patient Name: Joan Robinson MRN: [id number] DOB:05-19-1960, 62 y.o., female Today's Date: 01/01/2023  PCP: April Manson, NP REFERRING PROVIDER: Micki Riley, MD  END OF SESSION:  OT End of Session - 12/31/22 1542     Visit Number 1    Number of Visits 17    Date for OT Re-Evaluation 03/01/23    Authorization Type [employer]    OT Start Time 1536    OT Stop Time 1616    OT Time Calculation (min) 40 min             Past Medical History:  Diagnosis Date   Allergic rhinitis    Anemia 10/10/2011   Anxiety and depression 01/17/2007   Qualifier: Diagnosis of  By: Everardo All MD, Sean A    Arthritis 07/24/2013   Likely inflammatory and following with Dr Maryln Gottron of [facility]  rheumatology   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   BCC (basal cell carcinoma of skin) 06/01/2012   Leg Follows with Dr Margo Aye   Bipolar disorder (HCC) 01/17/2007   Qualifier: Diagnosis of  By: Everardo All MD, Sean A    Cataract    Diverticulosis    Diverticulosis    Emphysema of lung (HCC)    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   Gallstones    GERD (gastroesophageal reflux disease)    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   Hashimoto's disease    Hyperlipidemia    Hypertension    Hypothyroidism 08/24/2006   Qualifier: Diagnosis of  By: Everardo All MD, Sean A     IBS (irritable bowel syndrome) 07/27/2016   Obesity 11/01/2013   PUD (peptic ulcer disease)    Sleep apnea 04/27/2016   Tobacco abuse    Past Surgical History:  Procedure Laterality Date   ABDOMINAL HYSTERECTOMY     ABDOMINAL HYSTERECTOMY  01/15/2009   complete   COLONOSCOPY     DILATION AND CURETTAGE OF UTERUS  01/16/1983   EYE SURGERY  03/03/2014   Surgery on both eyes for epiretinal membrane (vitreous peel)   Gated Spect wall motion stress cardiolite  11/05/2001   HIATAL HERNIA REPAIR N/A 07/12/2021   Procedure: LAPAROSCOPY W/ EXTENSIVE FOREGUT DISSECTION; PARTIAL STOMACH REDUCTION; GASTROSTOMY TUBE  PLACEMENT; GASTROPEXY;  Surgeon: Luretha Murphy, MD;  Location: [facility];  Service: General;  Laterality: N/A;   LAPAROSCOPIC RIGHT HEMI COLECTOMY Right 10/16/2022   Procedure: LAPAROSCOPIC ASSISTED RIGHT HEMI COLECTOMY;  Surgeon: Andria Meuse, MD;  Location: [facility];  Service: General;  Laterality: Right;   POLYPECTOMY     TUBAL LIGATION  01/15/1993   UTERINE SUSPENSION     mesh   VITRECTOMY Bilateral 03/03/2014   XI ROBOTIC ASSISTED HIATAL HERNIA REPAIR N/A 05/01/2021   Procedure: XI ROBOTIC ASSISTED TYPE III HIATAL HERNIA REPAIR WITH FUNDOPLICATION;  Surgeon: Luretha Murphy, MD;  Location: [facility];  Service: General;  Laterality: N/A;   Patient Active Problem List   Diagnosis Date Noted   Dysphagia 11/05/2022   Vestibular dizziness 11/05/2022   Malnutrition of moderate degree 11/01/2022   Acute stroke due to ischemia [place]) 10/25/2022   CVA (cerebral vascular accident) (HCC) 10/25/2022   Protein-calorie malnutrition, severe 10/19/2022   Diverticulosis 10/14/2022   History of emphysema (HCC) 10/14/2022   SBO (small bowel obstruction) (HCC) 10/13/2022   Aortic valve calcification 09/20/2022   Abdominal aortic atherosclerosis (HCC) 09/20/2022   Hiatal hernia 07/12/2021   Status post laparoscopic Nissen fundoplication 05/01/2021   IBS (irritable bowel syndrome) 07/27/2016   High-tone pelvic  floor dysfunction 05/09/2016   Midline cystocele 05/09/2016   Vaginal atrophy 05/09/2016   Bladder prolapse, female, acquired 05/07/2016   Dystrophic nail 04/29/2016   Fatigue 04/27/2016   Sleep apnea 04/27/2016   UTI (urinary tract infection) 09/11/2015   Cataract cortical, senile, left 12/20/2014   Rectal lesion 10/10/2014   Other type of migraine without status migrainosus 05/06/2014   Dyspepsia 05/06/2014   Postmenopausal estrogen deficiency 05/06/2014   Screening for breast cancer 05/06/2014   Bilateral low-tension glaucoma, indeterminate stage 12/29/2013   High myopia, bilateral  12/29/2013   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   Obesity 11/01/2013   Eustachian tube dysfunction 10/08/2013   Otalgia of left ear 10/08/2013   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   Arthritis 07/24/2013   Anxiety attack 03/08/2013   Paresthesia 12/22/2012   Headache 08/17/2012   BCC (basal cell carcinoma of skin) 06/01/2012   Left arm pain 05/10/2012   Right knee pain 05/10/2012   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   ADD (attention deficit disorder) 04/13/2012   Perimenopausal 01/16/2012   Macular pucker of both eyes 10/18/2011   Vitreous degeneration 10/18/2011   Chronic anemia 10/10/2011   Preventative health care 10/10/2011   Urticaria, chronic 10/10/2011   Macular pucker, bilateral 10/10/2011   Lower back pain    DIVERTICULITIS-COLON 07/25/2009   Diverticulitis of colon 07/25/2009   Hyperlipidemia 06/09/2009   Essential hypertension 06/09/2009   History of esophageal stricture 09/06/2008   GERD (gastroesophageal reflux disease) 07/27/2008   Bipolar disorder (HCC) 01/17/2007   Hypothyroidism 08/24/2006   Allergic rhinitis 08/24/2006   URINARY INCONTINENCE 08/24/2006    ONSET DATE: 10/13/22  REFERRING DIAG: I63.9 (ICD-10-CM) - Brainstem stroke  THERAPY DIAG:  Hemiplegia and hemiparesis following cerebral infarction affecting left non-dominant side (HCC)  Muscle weakness (generalized)  Other disturbances of skin sensation  Other lack of coordination  Visuospatial deficit  Rationale for Evaluation and Treatment: Rehabilitation  SUBJECTIVE:   SUBJECTIVE STATEMENT: Pt reports balance is still a challenge, with L leg "so heavy".  Pt reports some impulsivity and may not think through things, but does recognize that she has a fear of falling and does not want to injure herself.  Pt reports getting "so exhausted" and has mood swings. Daughter comes over every other day to assist with shower - transfers, and washing back  and hair. Pt accompanied by: self and significant other (husband dropped her off)  PERTINENT HISTORY: admitted 9/28 with RUQ pain with SBO. 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 PMhx: HTN, HLD, peptic ulcer disease, diverticulitis, bipolar disorder, hypothyroidism, GERD, Hashimoto's disease.  PRECAUTIONS: Fall  WEIGHT BEARING RESTRICTIONS:  did have a lifting restriction (>5#) but this should be clear. To be confirmed with surgeon  PAIN:  Are you having pain? No  FALLS: Has patient fallen in last 6 months? Yes. Number of falls 1 fall prior to stroke and fell when out feeding the animals  LIVING ENVIRONMENT: Lives with: lives with their spouse and adult grandson with disabilities (dtr comes every other day to assist with shower) Lives in: House/apartment Stairs: Yes: External: 6 steps in front, 10 steps at back steps; on left going up; was to have ramp built but this has not happened yet and is managing steps with assist from husband Has following equipment at home: Single point cane, Walker - 2 wheeled, Walker - 4 wheeled, Wheelchair (manual), shower chair, bed side commode, and Grab bars (next  to toilet and 2 in the shower)  PLOF: Independent and Independent with basic ADLs  PATIENT GOALS: I want to get back to doing as much as I can.  OBJECTIVE:  Note: Objective measures were completed at Evaluation unless otherwise noted.  HAND DOMINANCE: Right  ADLs: Transfers/ambulation related to ADLs: w/c in the house or walking with RW when dtr is in the house, short distance walking from house to the car with cane and/or physical assistance from spouse Eating: husband cutting foods, pt utilizing straw for drinking, and does spill drinks and food intermittently  Grooming: Mod I  UB Dressing: assist for fastening bra, to don shirt LB Dressing: assist for donning socks, has elastic laces for shoes but has been able to tie  shoes Toileting: Supervision, spouse will assist intermittently Bathing: Supervision, dtr setup for soap and washing back and hair Tub Shower transfers: hand held assist when stepping in/out of shower Equipment: Shower seat with back, Grab bars, Walk in shower, bed side commode, Reacher, and Long handled sponge  IADLs: Light housekeeping: husband and dtr doing all Meal Prep: husband doing all the cooking Community mobility: not driving Medication management: dtr dispenses meds in pill box and utilizes alarm for reminders  Handwriting:  TBD - pt reports different but unsure if vision vs coordination  MOBILITY STATUS: Needs Assist: Supervision to CGA for mobility with RW, utilizing hand held assist when transferring to/from car and use of w/c in the home and community  POSTURE COMMENTS:  rounded shoulders  ACTIVITY TOLERANCE: Activity tolerance: diminished  FUNCTIONAL OUTCOME MEASURES: FOTO: 64, predicted 73 in 17 visits  UPPER EXTREMITY ROM:    Active ROM Right eval Left eval  Shoulder flexion WFL 160*  Shoulder abduction WFL   Shoulder adduction    Shoulder extension    Shoulder internal rotation [facility] WFL  Shoulder external rotation [facility] WFL  Elbow flexion WFL WFL  Elbow extension WFL Slight bend  Wrist flexion    Wrist extension    Wrist ulnar deviation    Wrist radial deviation    Wrist pronation    Wrist supination    (Blank rows = not tested)  UPPER EXTREMITY MMT:   Grossly 4+/5 bilaterally  HAND FUNCTION: TBD  COORDINATION: TBD  SENSATION: Light touch: WFL Pt reports impaired sensation of hot/cold and pinch on RUE and face,   COGNITION: Overall cognitive status: Within functional limits for tasks assessed  VISION: Subjective report: "this eye (left) doesn't close.  I can see shadows/change in light" Baseline vision: Wears glasses all the time  VISION ASSESSMENT: Eye alignment: Impaired: Left eye lower lid severely droops, upper lid droops; L eye  moves with R eye but no vision out of L eye  Ocular ROM: restricted on looking left and restricted on looking up Gaze preference/alignment: WDL Tracking/Visual pursuits: Decreased smoothness with vertical tracking Saccades: additional head turns occurred during testing Convergence: Impaired:   Visual Fields: Left visual field deficits Depth perception: Undershoots  Patient has difficulty with following activities due to following visual impairments: writing, reading  PRAXIS: Impaired: Motor planning; Ataxia on RUE  OBSERVATIONS: Pt well groomed and presented very knowledgeable about current status as well as medical history.  Pt limited in mobility in home due to reporting that her husband and adult grandson are both disabled and can provide intermittent assistance.   TODAY'S TREATMENT:  DATE:  12/31/22 NA, eval only   PATIENT EDUCATION: Education details: Educated on role and purpose of OT as well as potential interventions and goals for therapy based on initial evaluation findings. Person educated: Patient Education method: Explanation Education comprehension: verbalized understanding and needs further education  HOME EXERCISE PROGRAM: TBD   GOALS: Goals reviewed with patient? Yes  SHORT TERM GOALS: Target date: 02/01/23  Pt will be Mod I with initial neuromuscular re-education HEP for R UE. Baseline: Goal status: INITIAL  2.  Pt will be able to don/doff socks Mod I with AE PRN. Baseline:  Goal status: INITIAL  3.  Pt will be able to don/doff jacket with Supervision/setup utilizing adaptive strategies PRN. Baseline:  Goal status: INITIAL  4.  Pt will verbalize understanding of compensatory strategies to accommodate for decreased vision in L visual field. Baseline:  Goal status: INITIAL  5.  Pt will verbalize understanding of energy conservation strategies and report 2 strategies being  utilized during ADLs/IADLs to increase safety and independence. Baseline:  Goal status: INITIAL   LONG TERM GOALS: Target date: 03/01/23  Pt will demonstrate improved fine motor coordination for ADLs (clothing fasteners) as evidenced by decreasing 9 hole peg test score for LUE to < 35 seconds. Baseline: TBD (45 sec in IP Rehab) Goal status: INITIAL  2.  Pt will demonstrate improved motor control to safely retrieve a moderate weight object at from shelf at mod-high range.  Baseline:  Goal status: INITIAL  3.  Pt will demonstrate improved grip strength as needed to open new/tight food containers. Baseline:  Goal status: INITIAL  4.  Pt will demonstrate improved coordination and motor control with LUE during bimanual tasks requiring increased weight and/or force. Baseline:  Goal status: INITIAL  5.  Pt will demonstrate improved motor control in dominant UE as evidenced by improved legibility with handwriting. Baseline:  Goal status: INITIAL   ASSESSMENT:  CLINICAL IMPRESSION: Patient is a 62 y.o. female who was seen today for occupational therapy evaluation for Brainstem stroke with ataxia on L side and decreased sensation on R side.  Pt with drooping of L eye and L side of mouth.  Pt had previous therapy in [facility] followed by home health therapies where pt is overall supervision with ADLs, however still needing assistance with clothing fasteners, don/doffing jacket, and donning socks.  Pt reports impaired motor control of LUE impacting functional use and vision impairments impacting handwriting and spatial awareness.  Pt currently lives with spouse and adult grandson who are both disabled and able to assist only intermittently. Pt will benefit from skilled occupational therapy services to address strength and coordination, ROM, pain management, altered sensation, balance, GM/FM control, safety awareness, introduction of compensatory strategies/AE prn, visual-perception, and  implementation of an HEP to improve participation and safety during ADLs and IADLs.    PERFORMANCE DEFICITS: in functional skills including ADLs, IADLs, coordination, sensation, ROM, strength, pain, Fine motor control, Gross motor control, mobility, balance, body mechanics, endurance, decreased knowledge of precautions, decreased knowledge of use of DME, vision, and UE functional use and psychosocial skills including coping strategies, environmental adaptation, and routines and behaviors.   IMPAIRMENTS: are limiting patient from ADLs and IADLs.   CO-MORBIDITIES: may have co-morbidities  that affects occupational performance. Patient will benefit from skilled OT to address above impairments and improve overall function.  MODIFICATION OR ASSISTANCE TO COMPLETE EVALUATION: Min-Moderate modification of tasks or assist with assess necessary to complete an evaluation.  OT OCCUPATIONAL PROFILE AND HISTORY: Detailed  assessment: Review of records and additional review of physical, cognitive, psychosocial history related to current functional performance.  CLINICAL DECISION MAKING: Moderate - several treatment options, min-mod task modification necessary  REHAB POTENTIAL: Good  EVALUATION COMPLEXITY: Moderate    PLAN:  OT FREQUENCY: 2x/week  OT DURATION: 8 weeks  PLANNED INTERVENTIONS: 97168 OT Re-evaluation, 97535 self care/ADL training, 24401 therapeutic exercise, 97530 therapeutic activity, 97112 neuromuscular re-education, 97035 ultrasound, 97010 moist heat, 97010 cryotherapy, 97760 Orthotics management and training, balance training, functional mobility training, compression bandaging, visual/perceptual remediation/compensation, psychosocial skills training, energy conservation, coping strategies training, patient/family education, and DME and/or AE instructions  RECOMMENDED OTHER SERVICES: NA  CONSULTED AND AGREED WITH PLAN OF CARE: Patient  PLAN FOR NEXT SESSION: complete coordination  assessment (9 hole peg and box and blocks), assess handwriting, educate on visual and sensation compensatory strategies   Rosalio Loud, OTR/L 01/01/2023, 9:52 AM   [facility] at [facility] [address], [address] [place], [place] [address] Phone # [phone] Fax # [phone]

## 2022-12-31 NOTE — Patient Instructions (Signed)
   Try to put air in your cheeks and hold it for three seconds 10 times, 3 times a day say "ooo eee ooo eee" (Stretch those lips) 10 times, 3 times a day Open up your jaw as wide as you can 10 times, 3 times a day

## 2022-12-31 NOTE — Therapy (Signed)
OUTPATIENT PHYSICAL THERAPY NEURO EVALUATION   Patient Name: Joan Robinson MRN: [id number] DOB:October 03, 1960, 62 y.o., female Today's Date: 01/01/2023   PCP: April Manson, NP REFERRING PROVIDER: Micki Riley, MD   END OF SESSION:  PT End of Session - 12/31/22 1623     Visit Number 1    Number of Visits 18    Date for PT Re-Evaluation 03/01/23    Authorization Type [employer] 2024-60 visits/yr and 18 used (total remaining:  42)    Authorization - Visit Number 1    Authorization - Number of Visits 42   combined with OT, speech   Equipment Utilized During Treatment Gait belt    Activity Tolerance Patient tolerated treatment well    Behavior During Therapy [facility] for tasks assessed/performed             Past Medical History:  Diagnosis Date   Allergic rhinitis    Anemia 10/10/2011   Anxiety and depression 01/17/2007   Qualifier: Diagnosis of  By: Everardo All MD, Sean A    Arthritis 07/24/2013   Likely inflammatory and following with Dr Maryln Gottron of [facility]  rheumatology   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   BCC (basal cell carcinoma of skin) 06/01/2012   Leg Follows with Dr Margo Aye   Bipolar disorder (HCC) 01/17/2007   Qualifier: Diagnosis of  By: Everardo All MD, Sean A    Cataract    Diverticulosis    Diverticulosis    Emphysema of lung (HCC)    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   Gallstones    GERD (gastroesophageal reflux disease)    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   Hashimoto's disease    Hyperlipidemia    Hypertension    Hypothyroidism 08/24/2006   Qualifier: Diagnosis of  By: Everardo All MD, Sean A     IBS (irritable bowel syndrome) 07/27/2016   Obesity 11/01/2013   PUD (peptic ulcer disease)    Sleep apnea 04/27/2016   Tobacco abuse    Past Surgical History:  Procedure Laterality Date   ABDOMINAL HYSTERECTOMY     ABDOMINAL HYSTERECTOMY  01/15/2009   complete   COLONOSCOPY     DILATION AND CURETTAGE OF UTERUS  01/16/1983   EYE SURGERY  03/03/2014   Surgery on both eyes  for epiretinal membrane (vitreous peel)   Gated Spect wall motion stress cardiolite  11/05/2001   HIATAL HERNIA REPAIR N/A 07/12/2021   Procedure: LAPAROSCOPY W/ EXTENSIVE FOREGUT DISSECTION; PARTIAL STOMACH REDUCTION; GASTROSTOMY TUBE PLACEMENT; GASTROPEXY;  Surgeon: Luretha Murphy, MD;  Location: [facility];  Service: General;  Laterality: N/A;   LAPAROSCOPIC RIGHT HEMI COLECTOMY Right 10/16/2022   Procedure: LAPAROSCOPIC ASSISTED RIGHT HEMI COLECTOMY;  Surgeon: Andria Meuse, MD;  Location: [facility] OR;  Service: General;  Laterality: Right;   POLYPECTOMY     TUBAL LIGATION  01/15/1993   UTERINE SUSPENSION     mesh   VITRECTOMY Bilateral 03/03/2014   XI ROBOTIC ASSISTED HIATAL HERNIA REPAIR N/A 05/01/2021   Procedure: XI ROBOTIC ASSISTED TYPE III HIATAL HERNIA REPAIR WITH FUNDOPLICATION;  Surgeon: Luretha Murphy, MD;  Location: [facility];  Service: General;  Laterality: N/A;   Patient Active Problem List   Diagnosis Date Noted   Dysphagia 11/05/2022   Vestibular dizziness 11/05/2022   Malnutrition of moderate degree 11/01/2022   Acute stroke due to ischemia [facility]) 10/25/2022   CVA (cerebral vascular accident) (HCC) 10/25/2022   Protein-calorie malnutrition, severe 10/19/2022   Diverticulosis 10/14/2022   History of emphysema (HCC) 10/14/2022   SBO (small bowel obstruction) (  HCC) 10/13/2022   Aortic valve calcification 09/20/2022   Abdominal aortic atherosclerosis (HCC) 09/20/2022   Hiatal hernia 07/12/2021   Status post laparoscopic Nissen fundoplication 05/01/2021   IBS (irritable bowel syndrome) 07/27/2016   High-tone pelvic floor dysfunction 05/09/2016   Midline cystocele 05/09/2016   Vaginal atrophy 05/09/2016   Bladder prolapse, female, acquired 05/07/2016   Dystrophic nail 04/29/2016   Fatigue 04/27/2016   Sleep apnea 04/27/2016   UTI (urinary tract infection) 09/11/2015   Cataract cortical, senile, left 12/20/2014   Rectal lesion 10/10/2014   Other type of migraine without  status migrainosus 05/06/2014   Dyspepsia 05/06/2014   Postmenopausal estrogen deficiency 05/06/2014   Screening for breast cancer 05/06/2014   Bilateral low-tension glaucoma, indeterminate stage 12/29/2013   High myopia, bilateral 12/29/2013   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   Obesity 11/01/2013   Eustachian tube dysfunction 10/08/2013   Otalgia of left ear 10/08/2013   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   Arthritis 07/24/2013   Anxiety attack 03/08/2013   Paresthesia 12/22/2012   Headache 08/17/2012   BCC (basal cell carcinoma of skin) 06/01/2012   Left arm pain 05/10/2012   Right knee pain 05/10/2012   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   ADD (attention deficit disorder) 04/13/2012   Perimenopausal 01/16/2012   Macular pucker of both eyes 10/18/2011   Vitreous degeneration 10/18/2011   Chronic anemia 10/10/2011   Preventative health care 10/10/2011   Urticaria, chronic 10/10/2011   Macular pucker, bilateral 10/10/2011   Lower back pain    DIVERTICULITIS-COLON 07/25/2009   Diverticulitis of colon 07/25/2009   Hyperlipidemia 06/09/2009   Essential hypertension 06/09/2009   History of esophageal stricture 09/06/2008   GERD (gastroesophageal reflux disease) 07/27/2008   Bipolar disorder (HCC) 01/17/2007   Hypothyroidism 08/24/2006   Allergic rhinitis 08/24/2006   URINARY INCONTINENCE 08/24/2006    ONSET DATE: 10/13/2022  REFERRING DIAG: I63.9 (ICD-10-CM) - Brainstem stroke (HCC)   THERAPY DIAG:  Unsteadiness on feet  Muscle weakness (generalized)  Other abnormalities of gait and mobility  Rationale for Evaluation and Treatment: Rehabilitation  SUBJECTIVE:                                                                                                                                                                                             SUBJECTIVE STATEMENT: Sit in the w/c or recliner most of the day.  Did  have pressure area on buttocks upon first coming home from hospital, but better now.  Reports LLE feels like "concrete after sitting too long", RLE gets shaky. Pt accompanied by: self  PERTINENT HISTORY: Per  Dr. Pearlean Brownie office note 12/11/2022:  PMH of esophageal strictures status post dilation, multiple hernias status post multiple surgeries, GERD/PUD, hypertension, hyperlipidemia, autoimmune urticaria, emphysema, sleep apnea who presents to the ED on 10/13/2022 with right upper quadrant and right lower quadrant abdominal pain x 2 days associated nausea.  She was found to have small bowel obstruction and was being managed inpatient at [facility].  Around shift change, patient was noted to have left facial droop, slurring of her speech and upon further questioning, symptoms were first noted at 1700 on 10/14/2022.  Her team had difficulty establishing a last known well.  However, symptoms were first noted at 1700.  A code stroke was activated if she had a CT head without contrast which was notable for a moderately large left PICA territory infarct.  She was eval by teleneurology.  She was not given TNKase due to unclear last known well and the fact that the stroke looks completed.  CT angio head and neck with and without contrast was negative for any large vessel occlusion.  Specifically, no basilar occlusion or thrombosis noted.  CT perfusion with no core or mismatch noted.  Given the moderately large posterior fossa stroke which is in proximity to brainstem, and the extension of the stroke into the brainstem, she was transferred to [facility] medical ICU for immediate in person neurology evaluation and for close monitoring for signs of posterior fossa crowding and potential herniation.  She was monitored carefully in the ICU and remained stable and did not require any neurosurgical intervention.  MRI showed a large acute infarct involving both left PICA and Icar territory with mass effect on posterior  fossa and fourth ventricle mild hydrocephalus.  2D echo showed ejection fraction of 60 to 65%.  Left atrial size was normal.  LDL cholesterol 54 mg percent.  Hemoglobin A1c was 5.6.  Patient had significant left peripheral facial nerve weakness as well as dysphagia and left hemiataxia.  She was seen by physical Occupational Therapy transfer to inpatient rehab where she stayed for a month.  She made gradual improvement in his been home now for nearly 4 weeks.  She still has significant left facial weakness with dryness of the left eye with redness.  She is unable to close her left eye.  She does have an upcoming appointment to see ophthalmologist next week.  She is still not gotten any hearing back in the left ear.  She still has trouble swallowing has to swallow mostly on the right side.  She has a lot of drooling from the left corner of the mouth.  She still has some left-sided incoordination and she is working with therapist has shown some improvement.  She is still unable to walk on her own and requires 1 person assist with gait belt to walk with a wheelchair.  She is tolerating Plavix with minor bruising but no bleeding.  Patient is clearly significantly disabled and is currently on short-term disability and thinking about going on a long disability.  Patient also has a new complaint of neck pain and right arm numbness.  Her primary care physician has ordered an MRI of the C-spine is scheduled for next week for this.  Last CT head on 10/28/2022 had shown decrease mass effect in the posterior fossa with improved patency of the fourth ventricle.    PAIN:  Are you having pain?  Pain in RUE; pt seeing OT  PRECAUTIONS: Fall and Other: vision changes  Unable to see out of  L eye; needs to drink out of a straw  *to [facility] cornea specialist at [facility]; 5th CN damage and hearing loss L ear  RED FLAGS: None   WEIGHT BEARING RESTRICTIONS: No  FALLS: Has patient fallen in last 6 months? No  LIVING  ENVIRONMENT: Lives with: lives with their family and daughter comes over to help as husband can't be primary caregiver Lives in: House/apartment Stairs:  6 steps to front of home with L rail ascending Has following equipment at home: Single point cane, Environmental consultant - 2 wheeled, Environmental consultant - 4 wheeled, and Wheelchair (manual)  PLOF: Independent; enjoyed gardening, office work; took care of family  PATIENT GOALS: Pt's goal is to get back to being better to help with my family.  OBJECTIVE:  Note: Objective measures were completed at Evaluation unless otherwise noted.  DIAGNOSTIC FINDINGS: See above from CVA; MRI upcoming for RUE numbness and cervical pain  COGNITION: Overall cognitive status: Within functional limits for tasks assessed   SENSATION: Light touch: WFL except for R hand not being able to discern hot/cold  COORDINATION: Slowed alt toe taps, slowed heel to shin (R more than L)  MUSCLE TONE: LLE: Mild  POSTURE: rounded shoulders and forward head Sits in posterior pelvic tilt  LOWER EXTREMITY ROM:   AROM WFL BLEs  LOWER EXTREMITY MMT:    MMT Right Eval Left Eval  Hip flexion 4 4+  Hip extension    Hip abduction 4+ 4+  Hip adduction 4+ 4+  Hip internal rotation    Hip external rotation    Knee flexion 4 4+  Knee extension 4 4+  Ankle dorsiflexion 4+ 4+  Ankle plantarflexion    Ankle inversion 4 4  Ankle eversion 4 4  (Blank rows = not tested)   TRANSFERS: Assistive device utilized: None  Sit to stand: SBA Stand to sit: SBA  GAIT: Gait pattern: step to pattern, step through pattern, decreased step length- Right, decreased step length- Left, and narrow BOS Distance walked: 35 ft x 2 Assistive device utilized: Walker - 2 wheeled Level of assistance: CGA and Min A Comments: 38.54 sec 20 ft (0.52 ft/sec)  FUNCTIONAL TESTS:  5 times sit to stand: 27.75 sec with Ues; performs sit<>stand x 1 with arms crossed at chest Timed up and go (TUG): 45.94 sec with  RW  PATIENT SURVEYS:  FOTO 56; predicted 67  TODAY'S TREATMENT:                                                                                                                              DATE: 12/31/2022    PATIENT EDUCATION: Education details:  Eval results, POC Person educated: Patient Education method: Explanation Education comprehension: verbalized understanding  HOME EXERCISE PROGRAM: Not yet initiated  GOALS: Goals reviewed with patient? Yes  SHORT TERM GOALS: Target date: 02/01/2023  Pt will be independent with HEP for improved strength, balance, functional mobility. Baseline: Goal status: INITIAL  2.  Pt  will improve 5x sit<>stand to less than or equal to 20 sec to demonstrate improved functional strength and transfer efficiency. Baseline: 27.75 sec with BUE support Goal status: INITIAL  3.  Pt will improve TUG score to less than or equal to 35 sec for decreased fall risk. Baseline:  Goal status: INITIAL  4.  Berg score to improve by 7 points from baseline, for decreased fall risk. Baseline: TBA Goal status: INITIAL  5.  Pt will ambulate at least 500 ft outdoor community surfaces, with RW, supervision.  Baseline:  RW with CGA/min  Goals status:  INITIAL     LONG TERM GOALS: Target date: 03/01/2023  Pt will be independent with HEP for improved strength, balance, gait. Baseline:  Goal status: INITIAL  2.  Pt will improve 5x sit<>stand to less than or equal to 12.5 sed to demonstrate improved functional strength and transfer efficiency. Baseline:  Goal status: INITIAL  3.  Pt will improve TUG score to less than or equal to 20 sec for decreased fall risk. Baseline:  Goal status: INITIAL  4.  Pt will improve gait velocity to at least 1.3 ft/sec for improved gait efficiency and safety.  Baseline:  Goal status: INITIAL  5.  Pt will ambulate 200 ft, household and short community distances, mod I, with least restrictive assistive device (cane vs.  Walker) Baseline:  Goal status: INITIAL  6.  FOTO score to improve to 67 to demo improved overall functional mobility.  Baseline:  56  Goal status:  INITIAL   ASSESSMENT:  CLINICAL IMPRESSION: Patient is a 62 y.o. female who was seen today for physical therapy evaluation and treatment following brainstem CVA.  Pt presented to ED 10/13/22 with abdominal pain and was awaiting surgery for blockage; however, she began to have L facial droop and L sided weakness.  She had large left PICA territory infarct; with moderately large posterior fossa stroke and the extension of the stroke into the brainstem.  Also, of note, she is having R UE numbness and neck pain; MRI is scheduled.  She is also to have heart monitor placed.  She was in inpatient rehab and then received home health therapies.  She presents to OPPT today with decreased lower extremity strength, decreased balance, decreased independence with transfers and gait, decreased vision, decreased UE strength (seen by OT).  She is at fall risk per TUG, gait velocity and FTSTS scores.  Prior to CVA and hospitalization, she was independent and working.  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  She would benefit from skilled PT to address the above stated deficits to improve overall functional mobility, decreased fall risk, return to prior level of function.  OBJECTIVE IMPAIRMENTS: Abnormal gait, decreased balance, decreased mobility, difficulty walking, decreased strength, impaired tone, impaired vision/preception, and postural dysfunction.   ACTIVITY LIMITATIONS: carrying, lifting, standing, squatting, stairs, transfers, bathing, toileting, dressing, reach over head, hygiene/grooming, locomotion level, and caring for others  PARTICIPATION LIMITATIONS: meal prep, cleaning, laundry, interpersonal relationship, driving, shopping, community activity, occupation, and church  PERSONAL FACTORS: 3+ comorbidities: See  PMH above  are also affecting patient's functional outcome.   REHAB POTENTIAL: Good  CLINICAL DECISION MAKING: Evolving/moderate complexity  EVALUATION COMPLEXITY: Moderate  PLAN:  PT FREQUENCY:  1x/wk for 1 wk (following eval), then 2x/wk for 8 weeks  PT DURATION: other: 9 weeks  PLANNED INTERVENTIONS: 97110-Therapeutic exercises, 97530- Therapeutic activity, [id number]- Neuromuscular re-education, 97535- Self Care, [id number]- Manual therapy, [id number]- Gait training, [id number]-  Orthotic Fit/training, 78469- Aquatic Therapy, Patient/Family education, Balance training, Stair training, DME instructions, and Wheelchair mobility training  PLAN FOR NEXT SESSION: Initiate HEP; perform Berg.  Work on Investment banker, operational with step through pattern; sit<>stand and LLE weightbearing   Brooklinn Longbottom W., PT 01/01/2023, 8:39 AM  [facility] at [facility] [address] [place], [address] [place], [place] [address] Phone # [phone] Fax # [phone]

## 2023-01-01 ENCOUNTER — Telehealth (HOSPITAL_COMMUNITY): Payer: Self-pay | Admitting: *Deleted

## 2023-01-01 DIAGNOSIS — I480 Paroxysmal atrial fibrillation: Secondary | ICD-10-CM

## 2023-01-01 DIAGNOSIS — I639 Cerebral infarction, unspecified: Secondary | ICD-10-CM

## 2023-01-01 DIAGNOSIS — I491 Atrial premature depolarization: Secondary | ICD-10-CM

## 2023-01-01 NOTE — Telephone Encounter (Signed)
Attempted to contact patient to schedule OP MBS. Left VM. RKEEL

## 2023-01-03 DIAGNOSIS — Z0289 Encounter for other administrative examinations: Secondary | ICD-10-CM

## 2023-01-04 ENCOUNTER — Ambulatory Visit: Payer: BC Managed Care – PPO | Admitting: Physical Therapy

## 2023-01-04 ENCOUNTER — Ambulatory Visit: Payer: BC Managed Care – PPO | Admitting: Occupational Therapy

## 2023-01-04 ENCOUNTER — Ambulatory Visit: Payer: BC Managed Care – PPO

## 2023-01-07 ENCOUNTER — Inpatient Hospital Stay: Payer: BC Managed Care – PPO | Admitting: Neurology

## 2023-01-07 NOTE — Therapy (Signed)
OUTPATIENT PHYSICAL THERAPY NEURO TREATMENT   Patient Name: Joan Robinson MRN: [id number] DOB:05-21-1960, 62 y.o., female Today's Date: 01/08/2023   PCP: April Manson, NP REFERRING PROVIDER: Micki Riley, MD   END OF SESSION:  PT End of Session - 01/08/23 1131     Visit Number 2    Number of Visits 18    Date for PT Re-Evaluation 03/01/23    Authorization Type [employer] 2024-60 visits/yr and 18 used (total remaining:  42)    Authorization - Visit Number 2    Authorization - Number of Visits 42   combined with OT, speech   PT Start Time 1019    PT Stop Time 1101    PT Time Calculation (min) 42 min    Equipment Utilized During Treatment Gait belt    Activity Tolerance Patient tolerated treatment well    Behavior During Therapy WFL for tasks assessed/performed              Past Medical History:  Diagnosis Date   Allergic rhinitis    Anemia 10/10/2011   Anxiety and depression 01/17/2007   Qualifier: Diagnosis of  By: Everardo All MD, Sean A    Arthritis 07/24/2013   Likely inflammatory and following with Dr Maryln Gottron of [facility]  rheumatology   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   BCC (basal cell carcinoma of skin) 06/01/2012   Leg Follows with Dr Margo Aye   Bipolar disorder (HCC) 01/17/2007   Qualifier: Diagnosis of  By: Everardo All MD, Sean A    Cataract    Diverticulosis    Diverticulosis    Emphysema of lung (HCC)    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   Gallstones    GERD (gastroesophageal reflux disease)    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   Hashimoto's disease    Hyperlipidemia    Hypertension    Hypothyroidism 08/24/2006   Qualifier: Diagnosis of  By: Everardo All MD, Sean A     IBS (irritable bowel syndrome) 07/27/2016   Obesity 11/01/2013   PUD (peptic ulcer disease)    Sleep apnea 04/27/2016   Tobacco abuse    Past Surgical History:  Procedure Laterality Date   ABDOMINAL HYSTERECTOMY     ABDOMINAL HYSTERECTOMY  01/15/2009   complete   COLONOSCOPY     DILATION AND  CURETTAGE OF UTERUS  01/16/1983   EYE SURGERY  03/03/2014   Surgery on both eyes for epiretinal membrane (vitreous peel)   Gated Spect wall motion stress cardiolite  11/05/2001   HIATAL HERNIA REPAIR N/A 07/12/2021   Procedure: LAPAROSCOPY W/ EXTENSIVE FOREGUT DISSECTION; PARTIAL STOMACH REDUCTION; GASTROSTOMY TUBE PLACEMENT; GASTROPEXY;  Surgeon: Luretha Murphy, MD;  Location: [facility];  Service: General;  Laterality: N/A;   LAPAROSCOPIC RIGHT HEMI COLECTOMY Right 10/16/2022   Procedure: LAPAROSCOPIC ASSISTED RIGHT HEMI COLECTOMY;  Surgeon: Andria Meuse, MD;  Location: [facility];  Service: General;  Laterality: Right;   POLYPECTOMY     TUBAL LIGATION  01/15/1993   UTERINE SUSPENSION     mesh   VITRECTOMY Bilateral 03/03/2014   XI ROBOTIC ASSISTED HIATAL HERNIA REPAIR N/A 05/01/2021   Procedure: XI ROBOTIC ASSISTED TYPE III HIATAL HERNIA REPAIR WITH FUNDOPLICATION;  Surgeon: Luretha Murphy, MD;  Location: [facility];  Service: General;  Laterality: N/A;   Patient Active Problem List   Diagnosis Date Noted   Dysphagia 11/05/2022   Vestibular dizziness 11/05/2022   Malnutrition of moderate degree 11/01/2022   Acute stroke due to ischemia [facility]) 10/25/2022   CVA (cerebral vascular accident) (HCC)  10/25/2022   Protein-calorie malnutrition, severe 10/19/2022   Diverticulosis 10/14/2022   History of emphysema (HCC) 10/14/2022   SBO (small bowel obstruction) (HCC) 10/13/2022   Aortic valve calcification 09/20/2022   Abdominal aortic atherosclerosis (HCC) 09/20/2022   Hiatal hernia 07/12/2021   Status post laparoscopic Nissen fundoplication 05/01/2021   IBS (irritable bowel syndrome) 07/27/2016   High-tone pelvic floor dysfunction 05/09/2016   Midline cystocele 05/09/2016   Vaginal atrophy 05/09/2016   Bladder prolapse, female, acquired 05/07/2016   Dystrophic nail 04/29/2016   Fatigue 04/27/2016   Sleep apnea 04/27/2016   UTI (urinary tract infection) 09/11/2015   Cataract cortical,  senile, left 12/20/2014   Rectal lesion 10/10/2014   Other type of migraine without status migrainosus 05/06/2014   Dyspepsia 05/06/2014   Postmenopausal estrogen deficiency 05/06/2014   Screening for breast cancer 05/06/2014   Bilateral low-tension glaucoma, indeterminate stage 12/29/2013   High myopia, bilateral 12/29/2013   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   Obesity 11/01/2013   Eustachian tube dysfunction 10/08/2013   Otalgia of left ear 10/08/2013   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   Arthritis 07/24/2013   Anxiety attack 03/08/2013   Paresthesia 12/22/2012   Headache 08/17/2012   BCC (basal cell carcinoma of skin) 06/01/2012   Left arm pain 05/10/2012   Right knee pain 05/10/2012   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   ADD (attention deficit disorder) 04/13/2012   Perimenopausal 01/16/2012   Macular pucker of both eyes 10/18/2011   Vitreous degeneration 10/18/2011   Chronic anemia 10/10/2011   Preventative health care 10/10/2011   Urticaria, chronic 10/10/2011   Macular pucker, bilateral 10/10/2011   Lower back pain    DIVERTICULITIS-COLON 07/25/2009   Diverticulitis of colon 07/25/2009   Hyperlipidemia 06/09/2009   Essential hypertension 06/09/2009   History of esophageal stricture 09/06/2008   GERD (gastroesophageal reflux disease) 07/27/2008   Bipolar disorder (HCC) 01/17/2007   Hypothyroidism 08/24/2006   Allergic rhinitis 08/24/2006   URINARY INCONTINENCE 08/24/2006    ONSET DATE: 10/13/2022  REFERRING DIAG: I63.9 (ICD-10-CM) - Brainstem stroke (HCC)   THERAPY DIAG:  Unsteadiness on feet  Muscle weakness (generalized)  Other abnormalities of gait and mobility  Rationale for Evaluation and Treatment: Rehabilitation  SUBJECTIVE:                                                                                                                                                                                              SUBJECTIVE STATEMENT: Has a heart monitor and had a L eye surgery. Reports no lifting >gallon of milk for 2 weeks. L eye occluded.  Pt accompanied by: self  PERTINENT HISTORY: Per Dr. Pearlean Brownie office note 12/11/2022:  PMH of esophageal strictures status post dilation, multiple hernias status post multiple surgeries, GERD/PUD, hypertension, hyperlipidemia, autoimmune urticaria, emphysema, sleep apnea who presents to the ED on 10/13/2022 with right upper quadrant and right lower quadrant abdominal pain x 2 days associated nausea.  She was found to have small bowel obstruction and was being managed inpatient at [facility].  Around shift change, patient was noted to have left facial droop, slurring of her speech and upon further questioning, symptoms were first noted at 1700 on 10/14/2022.  Her team had difficulty establishing a last known well.  However, symptoms were first noted at 1700.  A code stroke was activated if she had a CT head without contrast which was notable for a moderately large left PICA territory infarct.  She was eval by teleneurology.  She was not given TNKase due to unclear last known well and the fact that the stroke looks completed.  CT angio head and neck with and without contrast was negative for any large vessel occlusion.  Specifically, no basilar occlusion or thrombosis noted.  CT perfusion with no core or mismatch noted.  Given the moderately large posterior fossa stroke which is in proximity to brainstem, and the extension of the stroke into the brainstem, she was transferred to [facility] medical ICU for immediate in person neurology evaluation and for close monitoring for signs of posterior fossa crowding and potential herniation.  She was monitored carefully in the ICU and remained stable and did not require any neurosurgical intervention.  MRI showed a large acute infarct involving both left PICA and Icar territory with mass effect on posterior fossa and fourth  ventricle mild hydrocephalus.  2D echo showed ejection fraction of 60 to 65%.  Left atrial size was normal.  LDL cholesterol 54 mg percent.  Hemoglobin A1c was 5.6.  Patient had significant left peripheral facial nerve weakness as well as dysphagia and left hemiataxia.  She was seen by physical Occupational Therapy transfer to inpatient rehab where she stayed for a month.  She made gradual improvement in his been home now for nearly 4 weeks.  She still has significant left facial weakness with dryness of the left eye with redness.  She is unable to close her left eye.  She does have an upcoming appointment to see ophthalmologist next week.  She is still not gotten any hearing back in the left ear.  She still has trouble swallowing has to swallow mostly on the right side.  She has a lot of drooling from the left corner of the mouth.  She still has some left-sided incoordination and she is working with therapist has shown some improvement.  She is still unable to walk on her own and requires 1 person assist with gait belt to walk with a wheelchair.  She is tolerating Plavix with minor bruising but no bleeding.  Patient is clearly significantly disabled and is currently on short-term disability and thinking about going on a long disability.  Patient also has a new complaint of neck pain and right arm numbness.  Her primary care physician has ordered an MRI of the C-spine is scheduled for next week for this.  Last CT head on 10/28/2022 had shown decrease mass effect in the posterior fossa with improved patency of the fourth ventricle.    PAIN:  Are you having pain?  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 Pt reports no lifting >gallon of milk for 2 weeks  RED FLAGS: None   WEIGHT BEARING RESTRICTIONS: No  FALLS: Has patient  fallen in last 6 months? No  LIVING ENVIRONMENT: Lives with: lives with their family and daughter comes over to help as husband can't be primary caregiver Lives in: House/apartment Stairs:  6 steps to front of home with L rail ascending Has following equipment at home: Single point cane, Environmental consultant - 2 wheeled, Environmental consultant - 4 wheeled, and Wheelchair (manual)  PLOF: Independent; enjoyed gardening, office work; took care of family  PATIENT GOALS: Pt's goal is to get back to being better to help with my family.  OBJECTIVE:   TODAY'S TREATMENT: 01/08/2023 Activity Comments  Berg 37/56  STS 2x5 Able to perform without UEs from w/c with PT sitting in front for safety  standing lateral wt shift With RW  standing  wt shift + alt forward step Report of R anterior thigh pulling sensation; 1 UE support on TM rail and CGA  standing heel/toe raise With RW  sitting ther-ex: LAQ with red TB 10x  HS curl with green TB 10x   Cueing to reduce speed     [facility] PT Assessment - 01/08/23 0001       Balance   Balance Assessed Yes      Standardized Balance Assessment   Standardized Balance Assessment Berg Balance Test      Berg Balance Test   Sit to Stand Able to stand  independently using hands    Standing Unsupported Able to stand safely 2 minutes    Sitting with Back Unsupported but Feet Supported on Floor or Stool Able to sit safely and securely 2 minutes    Stand to Sit Controls descent by using hands    Transfers Able to transfer safely, definite need of hands    Standing Unsupported with Eyes Closed Able to stand 10 seconds with supervision    Standing Unsupported with Feet Together Needs help to attain position but able to stand for 30 seconds with feet together    From Standing, Reach Forward with Outstretched Arm Can reach confidently >25 cm (10")    From Standing Position, Pick up Object from Floor Able to pick up shoe, needs supervision    From Standing Position, Turn to Look Behind Over each  Shoulder Looks behind from both sides and weight shifts well    Turn 360 Degrees Able to turn 360 degrees safely but slowly    Standing Unsupported, Alternately Place Feet on Step/Stool Able to complete >2 steps/needs minimal assist   8 reps with 1 UE on walker   Standing Unsupported, One Foot in Front Able to take small step independently and hold 30 seconds    Standing on One Leg Unable to try or needs assist to prevent fall    Total Score 37             HOME EXERCISE PROGRAM Last updated: 01/08/23 Access Code: [email or web address] URL: [email or web address] Date: 01/08/2023 Prepared by: [facility] - Outpatient  Rehab - [facility]  Exercises - Sit to Stand Without Arm Support  - 1 x daily - 5 x weekly - 2 sets - 10 reps - Heel Toe Raises with Counter Support  - 1 x daily - 5 x weekly -  2 sets - 10 reps - Alternating Step Forward with Support  - 1 x daily - 5 x weekly - 2 sets - 10 reps - Sitting Knee Extension with Resistance  - 1 x daily - 5 x weekly - 2 sets - 10 reps - Seated Hamstring Curl with Anchored Resistance  - 1 x daily - 5 x weekly - 2 sets - 10 reps    PATIENT EDUCATION: Education details: HEP with edu for safety, POC and encouraged dropping down to 1x/week d/t limited visits Person educated: Patient Education method: Explanation, Demonstration, Tactile cues, Verbal cues, and Handouts Education comprehension: verbalized understanding and returned demonstration    --------------------------------------------------------------- Note: Objective measures below were completed at Evaluation unless otherwise noted.  DIAGNOSTIC FINDINGS: See above from CVA; MRI upcoming for RUE numbness and cervical pain  COGNITION: Overall cognitive status: Within functional limits for tasks assessed   SENSATION: Light touch: WFL except for R hand not being able to discern hot/cold  COORDINATION: Slowed alt toe taps, slowed heel to shin (R more than L)  MUSCLE TONE:  LLE: Mild  POSTURE: rounded shoulders and forward head Sits in posterior pelvic tilt  LOWER EXTREMITY ROM:   AROM WFL BLEs  LOWER EXTREMITY MMT:    MMT Right Eval Left Eval  Hip flexion 4 4+  Hip extension    Hip abduction 4+ 4+  Hip adduction 4+ 4+  Hip internal rotation    Hip external rotation    Knee flexion 4 4+  Knee extension 4 4+  Ankle dorsiflexion 4+ 4+  Ankle plantarflexion    Ankle inversion 4 4  Ankle eversion 4 4  (Blank rows = not tested)   TRANSFERS: Assistive device utilized: None  Sit to stand: SBA Stand to sit: SBA  GAIT: Gait pattern: step to pattern, step through pattern, decreased step length- Right, decreased step length- Left, and narrow BOS Distance walked: 35 ft x 2 Assistive device utilized: Walker - 2 wheeled Level of assistance: CGA and Min A Comments: 38.54 sec 20 ft (0.52 ft/sec)  FUNCTIONAL TESTS:  5 times sit to stand: 27.75 sec with Ues; performs sit<>stand x 1 with arms crossed at chest Timed up and go (TUG): 45.94 sec with RW  PATIENT SURVEYS:  FOTO 56; predicted 67  TODAY'S TREATMENT:                                                                                                                              DATE: 12/31/2022    PATIENT EDUCATION: Education details:  Eval results, POC Person educated: Patient Education method: Explanation Education comprehension: verbalized understanding  HOME EXERCISE PROGRAM: Not yet initiated  GOALS: Goals reviewed with patient? Yes  SHORT TERM GOALS: Target date: 02/01/2023  Pt will be independent with HEP for improved strength, balance, functional mobility. Baseline: Goal status: IN PROGRESS  2.  Pt will improve 5x sit<>stand to less than or equal to 20 sec to demonstrate  improved functional strength and transfer efficiency. Baseline: 27.75 sec with BUE support Goal status: IN PROGRESS  3.  Pt will improve TUG score to less than or equal to 35 sec for decreased fall  risk. Baseline:  Goal status: IN PROGRESS  4.  Berg score to improve by 7 points from baseline, for decreased fall risk. Baseline: TBA Goal status:IN PROGRESS  5.  Pt will ambulate at least 500 ft outdoor community surfaces, with RW, supervision.  Baseline:  RW with CGA/min  Goals status: IN PROGRESS     LONG TERM GOALS: Target date: 03/01/2023  Pt will be independent with HEP for improved strength, balance, gait. Baseline:  Goal status: IN PROGRESS  2.  Pt will improve 5x sit<>stand to less than or equal to 12.5 sed to demonstrate improved functional strength and transfer efficiency. Baseline:  Goal status:IN PROGRESS  3.  Pt will improve TUG score to less than or equal to 20 sec for decreased fall risk. Baseline:  Goal status: IN PROGRESS  4.  Pt will improve gait velocity to at least 1.3 ft/sec for improved gait efficiency and safety.  Baseline:  Goal status:IN PROGRESS  5.  Pt will ambulate 200 ft, household and short community distances, mod I, with least restrictive assistive device (cane vs. Walker) Baseline:  Goal status: IN PROGRESS  6.  FOTO score to improve to 67 to demo improved overall functional mobility.  Baseline:  56  Goal status:  IN PROGRESS   ASSESSMENT:  CLINICAL IMPRESSION: Patient arrived to session with report of getting L eye surgery since last session and was advised to reduce lifting for 2 weeks. Patient scored 37/56, indicating an increased risk of falls. Worked on LE strengthening and balance activities in sitting and standing with edu for safety. These exercises were added into HEP. Reduced POC to 1x/week d/t pt's reduced authorized visits. Patient in agreement with this plan and denies complaints at end of session.   OBJECTIVE IMPAIRMENTS: Abnormal gait, decreased balance, decreased mobility, difficulty walking, decreased strength, impaired tone, impaired vision/preception, and postural dysfunction.   ACTIVITY LIMITATIONS: carrying,  lifting, standing, squatting, stairs, transfers, bathing, toileting, dressing, reach over head, hygiene/grooming, locomotion level, and caring for others  PARTICIPATION LIMITATIONS: meal prep, cleaning, laundry, interpersonal relationship, driving, shopping, community activity, occupation, and church  PERSONAL FACTORS: 3+ comorbidities: See PMH above  are also affecting patient's functional outcome.   REHAB POTENTIAL: Good  CLINICAL DECISION MAKING: Evolving/moderate complexity  EVALUATION COMPLEXITY: Moderate  PLAN:  PT FREQUENCY:  1x/wk for 8 weeks *adjusted on 01/08/23  PT DURATION: other: 9 weeks  PLANNED INTERVENTIONS: 97110-Therapeutic exercises, 97530- Therapeutic activity, 97112- Neuromuscular re-education, 97535- Self Care, 10272- Manual therapy, [phone]- Gait training, [phone]- Orthotic Fit/training, [phone]- Aquatic Therapy, Patient/Family education, Balance training, Stair training, DME instructions, and Wheelchair mobility training  PLAN FOR NEXT SESSION: review HEP; Work on Investment banker, operational with step through pattern; sit<>stand and LLE weightbearing    Baldemar Friday, PT, DPT 01/08/23 11:45 AM  [facility] at [facility] [address] [place], [address] [place], [place] [address] Phone # [phone] Fax # [phone]

## 2023-01-08 ENCOUNTER — Ambulatory Visit: Payer: BC Managed Care – PPO

## 2023-01-08 ENCOUNTER — Ambulatory Visit: Payer: BC Managed Care – PPO | Admitting: Occupational Therapy

## 2023-01-08 ENCOUNTER — Ambulatory Visit: Payer: BC Managed Care – PPO | Admitting: Physical Therapy

## 2023-01-08 ENCOUNTER — Encounter: Payer: Self-pay | Admitting: Physical Therapy

## 2023-01-08 DIAGNOSIS — M6281 Muscle weakness (generalized): Secondary | ICD-10-CM

## 2023-01-08 DIAGNOSIS — R2689 Other abnormalities of gait and mobility: Secondary | ICD-10-CM

## 2023-01-08 DIAGNOSIS — R2681 Unsteadiness on feet: Secondary | ICD-10-CM

## 2023-01-08 DIAGNOSIS — R471 Dysarthria and anarthria: Secondary | ICD-10-CM

## 2023-01-08 DIAGNOSIS — I69354 Hemiplegia and hemiparesis following cerebral infarction affecting left non-dominant side: Secondary | ICD-10-CM

## 2023-01-08 DIAGNOSIS — R41842 Visuospatial deficit: Secondary | ICD-10-CM

## 2023-01-08 DIAGNOSIS — R278 Other lack of coordination: Secondary | ICD-10-CM

## 2023-01-08 DIAGNOSIS — R208 Other disturbances of skin sensation: Secondary | ICD-10-CM

## 2023-01-08 DIAGNOSIS — R1312 Dysphagia, oropharyngeal phase: Secondary | ICD-10-CM

## 2023-01-08 NOTE — Therapy (Signed)
OUTPATIENT SPEECH LANGUAGE PATHOLOGY TREATMENT   Patient Name: Joan Robinson MRN: [id number] DOB:07-17-1960, 63 y.o., female Today's Date: 01/08/2023  PCP: Kathlen Brunswick, MD REFERRING PROVIDER:  Micki Riley, MD     END OF SESSION:  End of Session - 01/08/23 1118     Visit Number 2    Number of Visits 17    Date for SLP Re-Evaluation 03/01/23    SLP Start Time 1107    SLP Stop Time  1145    SLP Time Calculation (min) 38 min    Activity Tolerance Patient tolerated treatment well             Past Medical History:  Diagnosis Date   Allergic rhinitis    Anemia 10/10/2011   Anxiety and depression 01/17/2007   Qualifier: Diagnosis of  By: Everardo All MD, Sean A    Arthritis 07/24/2013   Likely inflammatory and following with Dr Maryln Gottron of [facility]  rheumatology   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   BCC (basal cell carcinoma of skin) 06/01/2012   Leg Follows with Dr Margo Aye   Bipolar disorder (HCC) 01/17/2007   Qualifier: Diagnosis of  By: Everardo All MD, Sean A    Cataract    Diverticulosis    Diverticulosis    Emphysema of lung (HCC)    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   Gallstones    GERD (gastroesophageal reflux disease)    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   Hashimoto's disease    Hyperlipidemia    Hypertension    Hypothyroidism 08/24/2006   Qualifier: Diagnosis of  By: Everardo All MD, Sean A     IBS (irritable bowel syndrome) 07/27/2016   Obesity 11/01/2013   PUD (peptic ulcer disease)    Sleep apnea 04/27/2016   Tobacco abuse    Past Surgical History:  Procedure Laterality Date   ABDOMINAL HYSTERECTOMY     ABDOMINAL HYSTERECTOMY  01/15/2009   complete   COLONOSCOPY     DILATION AND CURETTAGE OF UTERUS  01/16/1983   EYE SURGERY  03/03/2014   Surgery on both eyes for epiretinal membrane (vitreous peel)   Gated Spect wall motion stress cardiolite  11/05/2001   HIATAL HERNIA REPAIR N/A 07/12/2021   Procedure: LAPAROSCOPY W/ EXTENSIVE FOREGUT DISSECTION; PARTIAL  STOMACH REDUCTION; GASTROSTOMY TUBE PLACEMENT; GASTROPEXY;  Surgeon: Luretha Murphy, MD;  Location: [facility];  Service: General;  Laterality: N/A;   LAPAROSCOPIC RIGHT HEMI COLECTOMY Right 10/16/2022   Procedure: LAPAROSCOPIC ASSISTED RIGHT HEMI COLECTOMY;  Surgeon: Andria Meuse, MD;  Location: [facility];  Service: General;  Laterality: Right;   POLYPECTOMY     TUBAL LIGATION  01/15/1993   UTERINE SUSPENSION     mesh   VITRECTOMY Bilateral 03/03/2014   XI ROBOTIC ASSISTED HIATAL HERNIA REPAIR N/A 05/01/2021   Procedure: XI ROBOTIC ASSISTED TYPE III HIATAL HERNIA REPAIR WITH FUNDOPLICATION;  Surgeon: Luretha Murphy, MD;  Location: [facility];  Service: General;  Laterality: N/A;   Patient Active Problem List   Diagnosis Date Noted   Dysphagia 11/05/2022   Vestibular dizziness 11/05/2022   Malnutrition of moderate degree 11/01/2022   Acute stroke due to ischemia [facility]) 10/25/2022   CVA (cerebral vascular accident) (HCC) 10/25/2022   Protein-calorie malnutrition, severe 10/19/2022   Diverticulosis 10/14/2022   History of emphysema (HCC) 10/14/2022   SBO (small bowel obstruction) (HCC) 10/13/2022   Aortic valve calcification 09/20/2022   Abdominal aortic atherosclerosis (HCC) 09/20/2022   Hiatal hernia 07/12/2021   Status post laparoscopic Nissen fundoplication 05/01/2021   IBS (irritable  bowel syndrome) 07/27/2016   High-tone pelvic floor dysfunction 05/09/2016   Midline cystocele 05/09/2016   Vaginal atrophy 05/09/2016   Bladder prolapse, female, acquired 05/07/2016   Dystrophic nail 04/29/2016   Fatigue 04/27/2016   Sleep apnea 04/27/2016   UTI (urinary tract infection) 09/11/2015   Cataract cortical, senile, left 12/20/2014   Rectal lesion 10/10/2014   Other type of migraine without status migrainosus 05/06/2014   Dyspepsia 05/06/2014   Postmenopausal estrogen deficiency 05/06/2014   Screening for breast cancer 05/06/2014   Bilateral low-tension glaucoma, indeterminate stage  12/29/2013   High myopia, bilateral 12/29/2013   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   Obesity 11/01/2013   Eustachian tube dysfunction 10/08/2013   Otalgia of left ear 10/08/2013   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   Arthritis 07/24/2013   Anxiety attack 03/08/2013   Paresthesia 12/22/2012   Headache 08/17/2012   BCC (basal cell carcinoma of skin) 06/01/2012   Left arm pain 05/10/2012   Right knee pain 05/10/2012   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   ADD (attention deficit disorder) 04/13/2012   Perimenopausal 01/16/2012   Macular pucker of both eyes 10/18/2011   Vitreous degeneration 10/18/2011   Chronic anemia 10/10/2011   Preventative health care 10/10/2011   Urticaria, chronic 10/10/2011   Macular pucker, bilateral 10/10/2011   Lower back pain    DIVERTICULITIS-COLON 07/25/2009   Diverticulitis of colon 07/25/2009   Hyperlipidemia 06/09/2009   Essential hypertension 06/09/2009   History of esophageal stricture 09/06/2008   GERD (gastroesophageal reflux disease) 07/27/2008   Bipolar disorder (HCC) 01/17/2007   Hypothyroidism 08/24/2006   Allergic rhinitis 08/24/2006   URINARY INCONTINENCE 08/24/2006    ONSET DATE: September 2024   REFERRING DIAG:  I63.9 (ICD-10-CM) - Brainstem stroke (HCC)    THERAPY DIAG:  Dysarthria and anarthria  Dysphagia, oropharyngeal phase  Rationale for Evaluation and Treatment: Rehabilitation  SUBJECTIVE:   SUBJECTIVE STATEMENT: "They had to do emergency surgery on my eye." "Do you think I really need the swallow test."  Pt accompanied by: self  PERTINENT HISTORY: 62 yo admitted 9/28 with RUQ pain with SBO. 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 PMhx: HTN, HLD, peptic ulcer disease, diverticulitis, bipolar disorder, hypothyroidism, GERD, Hashimoto's disease. Hemicolectomy completed on 10/1.  PAIN:  Are you having  pain? RUE pain - see OT eval    FALLS: Has patient fallen in last 6 months?  No  PATIENT GOALS: Improve speech and swallowing skills  OBJECTIVE:  Note: Objective measures were completed at Evaluation unless otherwise noted.  DIAGNOSTIC FINDINGS:  MRI 10/15/22 IMPRESSION: Large acute infarct (affecting both the left PICA and left AICA vascular territories) as described. Posterior fossa mass effect with effacement of the fourth ventricle inferiorly. No evidence of obstructive hydrocephalus at this time. Mild inferior displacement of the cerebellar tonsils (which extend up to 4 mm below the level of the foramen magnum). No more than mild mass effect upon the medulla at this time. Close imaging follow-up is recommended to monitor the evolving posterior fossa mass effect.   RECOMMENDATIONS FROM OBJECTIVE SWALLOW STUDY (MBSS/FEES):   Swallowing Evaluation Recommendations (11/05/22) Dysphagia 2 (Finely chopped); Thin liquids (Level 0) Liquid Administration via: Cup; Spoon; Straw Medication Administration: Crushed with puree Supervision: Full supervision/cueing for swallowing strategies; Patient able to self-feed Objective swallow impairments: Clinical Impression: Pt presents with moderate oral and mild pharyngeal dysphagia. Oral phase characterized by reduced labial seal (resulting in anterior bolus escape beyond  the mid chin with thin liquids from teaspoon), impaired/slowed mastication, slowed AP transit, and mild-moderate oral residue after the initial swallow. Patient sensate to residue and works to clear independently. Pharyngeally, deficits characterized by reduced pharyngeal constriction and reduced BOT retraction to the posterior pharyngeal wall resulting in trace-mild residue in the vallecula with all trialed consistencies. Both orally and pharyngeally, amount of residue increases alongside bolus viscosity. Patient continues to present with trismus that makes oral acceptance of larger  bites of food difficult, though this is improving. No significant penetration/aspiration observed throughout all trials. Straw bolus acceptance minimized anterior bolus loss and adding an additional dry swallow after solids mitigated residue in the vallecula. Recommend diet upgrade to Dys2/thin liquid diet, adding additional swallow intermittently to clear residue. Family updated to results and in agreement with plan. Objective recommended compensations:  Swallowing strategies: Slow rate; Check for pocketing or oral holding; Check for anterior loss; Multiple dry swallows after each bite/sip Postural changes: Position pt fully upright for meals; Stay upright 30-60 min after meals Oral care recommendations: Oral care BID (2x/day)   PATIENT REPORTED OUTCOME MEASURES (PROM): EAT-10:   and Speech - QOL completed this session. EAT-10 was 21/40 with higher scores  indicating worse effect of swallow on QOL. Speech QOL score was 25/30 with higher scores indicating worse effect of speech on pt's QOL.    TODAY'S TREATMENT:                                                                                                                                         DATE:  01/08/23: PROMs completed today. SLP addressed two more trismus exercises with pt today. She req'd min-mod A rarely, faded to modified independence. SLP and pt discussed rationale for swallow assessment (MBS) and pt agreed she needed this assessment completed.   12/31/22 (eval): SLP educated/reviewed three labial exercises with pt and provided handout. SLP role in rehab process was explained. Reviewed pt's swallow strategies with her.  PATIENT EDUCATION: Education details: see "today's treatment" Person educated: Patient Education method: Explanation, Demonstration, Verbal cues, and Handouts Education comprehension: verbalized understanding, returned demonstration, verbal cues required, and needs further education   GOALS: Goals reviewed with  patient? No  SHORT TERM GOALS: Target date: 02/01/23  Pt will perform dysarthria HEP, and dysphagia (if necessary) HEP with rare min A in two sessions Baseline: Goal status: INITIAL  2.  Pt will perform MBS to assess current swallow status Baseline:  Goal status: INITIAL  3.  Pt will follow swallow precautions from MBS with POs with rare min A in two sessions Baseline:  Goal status: INITIAL  4.  Pt will improve articulation in 10 minutes conversation with compensatory strategies with rare min A Baseline:  Goal status: INITIAL   LONG TERM GOALS: Target date: 03/01/23  Pt will improve PROM compared to initial administration Baseline:  Goal status: INITIAL  2.  Pt  will improve articulation in 10 minutes conversation using compensatory strategies Baseline:  Goal status: INITIAL  3.  Pt will perform dysarthria HEP, and dysphagia (if necessary) HEP with modified independence in three sessions Baseline:  Goal status: INITIAL  4.  Pt will follow swallow precautions from MBS with POs with modified independence in 3 sessions Baseline:  Goal status: INITIAL   ASSESSMENT:  CLINICAL IMPRESSION: Patient is a 62 y.o. F who was evaluated for swallowing and speech after a CVA in October 2024. Today she completed PROMs and was taught two more exercises for trismus. Pt would benefit from follow up MBS as her previous study was approx 8 weeks ago. Currently pt cont with limited sensation and limited/no labial or facial movement on lt side. Pt is drinking with a straw and reports lt labial sulcus residue which she clears with finger sweep due to lingual movement into sulcus is inefficient at this time.  OBJECTIVE IMPAIRMENTS: include dysarthria and dysphagia. These impairments are limiting patient from ADLs/IADLs, effectively communicating at home and in community, and safety when swallowing. Factors affecting potential to achieve goals and functional outcome are severity of impairments and  unknown prognosis for nerve recovery on lt side . Patient will benefit from skilled SLP services to address above impairments and improve overall function.  REHAB POTENTIAL: Good  PLAN:  SLP FREQUENCY: 2x/week  SLP DURATION: 8 weeks  PLANNED INTERVENTIONS: 92526 Treatment of swallowing function, 92507 Treatment of speech (30 or 45 min) , Re-evaluation, Aspiration precaution training, Pharyngeal strengthening exercises, Diet toleration management , Environmental controls, Trials of upgraded texture/liquids, Oral motor exercises, Functional tasks, Multimodal communication approach, SLP instruction and feedback, Compensatory strategies, Patient/family education, and MBS    Donyel Castagnola, CCC-SLP 01/08/2023, 11:20 AM

## 2023-01-08 NOTE — Patient Instructions (Addendum)
   Trismus exercises: DO THESE IN FRONT OF A MIRROR:  1) Place a thumb on your upper teeth in the middle of your jaw.  Place the pointer finger of your other hand on lower teeth in the middle of your jaw.  Stretch your jaw open by pushing your bottom jaw down with pointer finger.  Hold this stretch for 30 seconds - repeat   2)  Move your jaw as described below. Hold each position for 3 seconds. Repeat 5 times.  Open your mouth wide for 3 seconds  Move your jaw to the left for 3 seconds  Move your jaw to the right for 3 seconds 3)  Repeat each position above once more. This time stretch each for 30 seconds, once

## 2023-01-08 NOTE — Therapy (Signed)
OUTPATIENT OCCUPATIONAL THERAPY NEURO  Treatment Note  Patient Name: Joan Robinson MRN: [id number] DOB:1960-02-07, 62 y.o., female Today's Date: 01/08/2023  PCP: April Manson, NP REFERRING PROVIDER: Micki Riley, MD  END OF SESSION:  OT End of Session - 01/08/23 0949     Visit Number 2    Number of Visits 17    Date for OT Re-Evaluation 03/01/23    Authorization Type [employer]    OT Start Time 0934    OT Stop Time 1016    OT Time Calculation (min) 42 min              Past Medical History:  Diagnosis Date   Allergic rhinitis    Anemia 10/10/2011   Anxiety and depression 01/17/2007   Qualifier: Diagnosis of  By: Everardo All MD, Sean A    Arthritis 07/24/2013   Likely inflammatory and following with Dr Maryln Gottron of [facility]  rheumatology   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   BCC (basal cell carcinoma of skin) 06/01/2012   Leg Follows with Dr Margo Aye   Bipolar disorder (HCC) 01/17/2007   Qualifier: Diagnosis of  By: Everardo All MD, Sean A    Cataract    Diverticulosis    Diverticulosis    Emphysema of lung (HCC)    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   Gallstones    GERD (gastroesophageal reflux disease)    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   Hashimoto's disease    Hyperlipidemia    Hypertension    Hypothyroidism 08/24/2006   Qualifier: Diagnosis of  By: Everardo All MD, Sean A     IBS (irritable bowel syndrome) 07/27/2016   Obesity 11/01/2013   PUD (peptic ulcer disease)    Sleep apnea 04/27/2016   Tobacco abuse    Past Surgical History:  Procedure Laterality Date   ABDOMINAL HYSTERECTOMY     ABDOMINAL HYSTERECTOMY  01/15/2009   complete   COLONOSCOPY     DILATION AND CURETTAGE OF UTERUS  01/16/1983   EYE SURGERY  03/03/2014   Surgery on both eyes for epiretinal membrane (vitreous peel)   Gated Spect wall motion stress cardiolite  11/05/2001   HIATAL HERNIA REPAIR N/A 07/12/2021   Procedure: LAPAROSCOPY W/ EXTENSIVE FOREGUT DISSECTION; PARTIAL STOMACH REDUCTION;  GASTROSTOMY TUBE PLACEMENT; GASTROPEXY;  Surgeon: Luretha Murphy, MD;  Location: [facility];  Service: General;  Laterality: N/A;   LAPAROSCOPIC RIGHT HEMI COLECTOMY Right 10/16/2022   Procedure: LAPAROSCOPIC ASSISTED RIGHT HEMI COLECTOMY;  Surgeon: Andria Meuse, MD;  Location: [facility];  Service: General;  Laterality: Right;   POLYPECTOMY     TUBAL LIGATION  01/15/1993   UTERINE SUSPENSION     mesh   VITRECTOMY Bilateral 03/03/2014   XI ROBOTIC ASSISTED HIATAL HERNIA REPAIR N/A 05/01/2021   Procedure: XI ROBOTIC ASSISTED TYPE III HIATAL HERNIA REPAIR WITH FUNDOPLICATION;  Surgeon: Luretha Murphy, MD;  Location: [facility];  Service: General;  Laterality: N/A;   Patient Active Problem List   Diagnosis Date Noted   Dysphagia 11/05/2022   Vestibular dizziness 11/05/2022   Malnutrition of moderate degree 11/01/2022   Acute stroke due to ischemia [facility]) 10/25/2022   CVA (cerebral vascular accident) (HCC) 10/25/2022   Protein-calorie malnutrition, severe 10/19/2022   Diverticulosis 10/14/2022   History of emphysema (HCC) 10/14/2022   SBO (small bowel obstruction) (HCC) 10/13/2022   Aortic valve calcification 09/20/2022   Abdominal aortic atherosclerosis (HCC) 09/20/2022   Hiatal hernia 07/12/2021   Status post laparoscopic Nissen fundoplication 05/01/2021   IBS (irritable bowel syndrome) 07/27/2016  High-tone pelvic floor dysfunction 05/09/2016   Midline cystocele 05/09/2016   Vaginal atrophy 05/09/2016   Bladder prolapse, female, acquired 05/07/2016   Dystrophic nail 04/29/2016   Fatigue 04/27/2016   Sleep apnea 04/27/2016   UTI (urinary tract infection) 09/11/2015   Cataract cortical, senile, left 12/20/2014   Rectal lesion 10/10/2014   Other type of migraine without status migrainosus 05/06/2014   Dyspepsia 05/06/2014   Postmenopausal estrogen deficiency 05/06/2014   Screening for breast cancer 05/06/2014   Bilateral low-tension glaucoma, indeterminate stage 12/29/2013   High  myopia, bilateral 12/29/2013   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   Obesity 11/01/2013   Eustachian tube dysfunction 10/08/2013   Otalgia of left ear 10/08/2013   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   Arthritis 07/24/2013   Anxiety attack 03/08/2013   Paresthesia 12/22/2012   Headache 08/17/2012   BCC (basal cell carcinoma of skin) 06/01/2012   Left arm pain 05/10/2012   Right knee pain 05/10/2012   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   ADD (attention deficit disorder) 04/13/2012   Perimenopausal 01/16/2012   Macular pucker of both eyes 10/18/2011   Vitreous degeneration 10/18/2011   Chronic anemia 10/10/2011   Preventative health care 10/10/2011   Urticaria, chronic 10/10/2011   Macular pucker, bilateral 10/10/2011   Lower back pain    DIVERTICULITIS-COLON 07/25/2009   Diverticulitis of colon 07/25/2009   Hyperlipidemia 06/09/2009   Essential hypertension 06/09/2009   History of esophageal stricture 09/06/2008   GERD (gastroesophageal reflux disease) 07/27/2008   Bipolar disorder (HCC) 01/17/2007   Hypothyroidism 08/24/2006   Allergic rhinitis 08/24/2006   URINARY INCONTINENCE 08/24/2006    ONSET DATE: 10/13/22  REFERRING DIAG: I63.9 (ICD-10-CM) - Brainstem stroke  THERAPY DIAG:  Muscle weakness (generalized)  Hemiplegia and hemiparesis following cerebral infarction affecting left non-dominant side (HCC)  Other disturbances of skin sensation  Other lack of coordination  Visuospatial deficit  Unsteadiness on feet  Rationale for Evaluation and Treatment: Rehabilitation  SUBJECTIVE:   SUBJECTIVE STATEMENT: Pt reports having "emergency surgery" on her eye where they sewed her eyelid shut and did Botox around the eye lid to allow the eye to rest and get some moisture.  Pt reports that she is not to lift anything greater than a gallon of milk for 2 weeks. Pt accompanied by: self and significant other (husband dropped  her off)  PERTINENT HISTORY: admitted 9/28 with RUQ pain with SBO. 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 PMhx: HTN, HLD, peptic ulcer disease, diverticulitis, bipolar disorder, hypothyroidism, GERD, Hashimoto's disease.  PRECAUTIONS: Fall  WEIGHT BEARING RESTRICTIONS:  lifting restrictions: not to lift anything > gallon of milk for 2 weeks  PAIN:  Are you having pain? No - however reports "uncomfortableness" and numbness in R arm  FALLS: Has patient fallen in last 6 months? Yes. Number of falls 1 fall prior to stroke and fell when out feeding the animals  LIVING ENVIRONMENT: Lives with: lives with their spouse and adult grandson with disabilities (dtr comes every other day to assist with shower) Lives in: House/apartment Stairs: Yes: External: 6 steps in front, 10 steps at back steps; on left going up; was to have ramp built but this has not happened yet and is managing steps with assist from husband Has following equipment at home: Single point cane, Walker - 2 wheeled, Environmental consultant - 4 wheeled, Wheelchair (manual), shower chair, bed side commode, and Grab bars (next to toilet and 2 in  the shower)  PLOF: Independent and Independent with basic ADLs  PATIENT GOALS: I want to get back to doing as much as I can.  OBJECTIVE:  Note: Objective measures were completed at Evaluation unless otherwise noted.  HAND DOMINANCE: Right  ADLs: Transfers/ambulation related to ADLs: w/c in the house or walking with RW when dtr is in the house, short distance walking from house to the car with cane and/or physical assistance from spouse Eating: husband cutting foods, pt utilizing straw for drinking, and does spill drinks and food intermittently  Grooming: Mod I  UB Dressing: assist for fastening bra, to don shirt LB Dressing: assist for donning socks, has elastic laces for shoes but has been able to tie shoes Toileting: Supervision, spouse  will assist intermittently Bathing: Supervision, dtr setup for soap and washing back and hair Tub Shower transfers: hand held assist when stepping in/out of shower Equipment: Shower seat with back, Grab bars, Walk in shower, bed side commode, Reacher, and Long handled sponge  IADLs: Light housekeeping: husband and dtr doing all Meal Prep: husband doing all the cooking Community mobility: not driving Medication management: dtr dispenses meds in pill box and utilizes alarm for reminders  Handwriting:  TBD - pt reports different but unsure if vision vs coordination  MOBILITY STATUS: Needs Assist: Supervision to CGA for mobility with RW, utilizing hand held assist when transferring to/from car and use of w/c in the home and community  POSTURE COMMENTS:  rounded shoulders  ACTIVITY TOLERANCE: Activity tolerance: diminished  FUNCTIONAL OUTCOME MEASURES: FOTO: 64, predicted 73 in 17 visits  UPPER EXTREMITY ROM:    Active ROM Right eval Left eval  Shoulder flexion WFL 160*  Shoulder abduction WFL   Shoulder adduction    Shoulder extension    Shoulder internal rotation [facility] WFL  Shoulder external rotation [facility] WFL  Elbow flexion WFL WFL  Elbow extension WFL Slight bend  Wrist flexion    Wrist extension    Wrist ulnar deviation    Wrist radial deviation    Wrist pronation    Wrist supination    (Blank rows = not tested)  UPPER EXTREMITY MMT:   Grossly 4+/5 bilaterally  HAND FUNCTION: TBD  COORDINATION: 9 Hole Peg test: Right: 25.87 sec; Left: 49.78 sec Box and Blocks:  Right 57 blocks, Left 46 blocks  SENSATION: Light touch: WFL Pt reports impaired sensation of hot/cold and pinch on RUE and face,   COGNITION: Overall cognitive status: Within functional limits for tasks assessed  VISION: Subjective report: "this eye (left) doesn't close.  I can see shadows/change in light" Baseline vision: Wears glasses all the time  VISION ASSESSMENT: Eye alignment: Impaired: Left  eye lower lid severely droops, upper lid droops; L eye moves with R eye but no vision out of L eye  Ocular ROM: restricted on looking left and restricted on looking up Gaze preference/alignment: WDL Tracking/Visual pursuits: Decreased smoothness with vertical tracking Saccades: additional head turns occurred during testing Convergence: Impaired:   Visual Fields: Left visual field deficits Depth perception: Undershoots  Patient has difficulty with following activities due to following visual impairments: writing, reading  PRAXIS: Impaired: Motor planning; Ataxia on RUE  OBSERVATIONS: Pt well groomed and presented very knowledgeable about current status as well as medical history.  Pt limited in mobility in home due to reporting that her husband and adult grandson are both disabled and can provide intermittent assistance.   TODAY'S TREATMENT:  DATE:  01/08/23 Coordination: completed 9hole peg and box and blocks (see above for measurements).  Pt with decreased motor control and coordination on L > R.  Pt reports she had been improving on with LUE coordination, however feels that things are more impacted since eye surgery last week.   Memory: pt reports having calendar of appts printed out and has shared it with her daughter to facilitate increased carryover of attendance to appts. Pt reports having [employer] to assist in recall of appointments and to set reminders, however it does not seem to recognize her voice.   Handwriting: pt with good legibility this session when writing 2-3 sentences. Therapeutic activity: engaged in large grip peg pattern replication in standing with focus on coordination and standing balance.  OT providing CGA for standing balance and cues for sequencing with pattern.  Pt demonstrating difficulty with diagonal pattern requiring cues to count spaces to allow for sequencing and spacing.  Pt with  improved control with large grip pegs compared to smaller pegs during 9 hole peg test.   12/31/22 NA, eval only   PATIENT EDUCATION: Education details: Educated on role and purpose of OT as well as potential interventions and goals for therapy based on initial evaluation findings. Person educated: Patient Education method: Explanation Education comprehension: verbalized understanding and needs further education  HOME EXERCISE PROGRAM: TBD   GOALS: Goals reviewed with patient? Yes  SHORT TERM GOALS: Target date: 02/01/23  Pt will be Mod I with initial neuromuscular re-education HEP for R UE. Baseline: Goal status: IN PROGRESS  2.  Pt will be able to don/doff socks Mod I with AE PRN. Baseline:  Goal status: IN PROGRESS  3.  Pt will be able to don/doff jacket with Supervision/setup utilizing adaptive strategies PRN. Baseline:  Goal status: IN PROGRESS  4.  Pt will verbalize understanding of compensatory strategies to accommodate for decreased vision in L visual field. Baseline:  Goal status: IN PROGRESS  5.  Pt will verbalize understanding of energy conservation strategies and report 2 strategies being utilized during ADLs/IADLs to increase safety and independence. Baseline:  Goal status: IN PROGRESS   LONG TERM GOALS: Target date: 03/01/23  Pt will demonstrate improved fine motor coordination for ADLs (clothing fasteners) as evidenced by decreasing 9 hole peg test score for LUE to < 35 seconds. Baseline: L: 49.78 sec on 01/08/23 Goal status: IN PROGRESS  2.  Pt will demonstrate improved motor control to safely retrieve a moderate weight object at from shelf at mod-high range.  Baseline:  Goal status: IN PROGRESS  3.  Pt will demonstrate improved grip strength as needed to open new/tight food containers. Baseline:  Goal status: IN PROGRESS  4.  Pt will demonstrate improved coordination and motor control with LUE during bimanual tasks requiring increased weight  and/or force. Baseline:  Goal status: IN PROGRESS  5.  Pt will demonstrate improved motor control in dominant UE as evidenced by improved legibility with handwriting. Baseline:  Goal status: MET - pt with 100% legibility on 01/08/23   ASSESSMENT:  CLINICAL IMPRESSION: Pt with recent eye surgery with resulting lifting restrictions for 2 weeks, not to lift anything > gallon of milk - therefore did not complete any strengthening or resistive tasks this session.  Pt completed stand pivot transfers with CGA to close supervision with cues for sequencing to increase safety.  Pt demonstrating decreased coordination with LUE compared to when d/c from IP Rehab.  Pt demonstrating good standing tolerance during activity and with  good attention to LUE during coordination tasks.  Pt will benefit from coordination HEP for home to continue to address fine and gross motor coordination impairments.     PERFORMANCE DEFICITS: in functional skills including ADLs, IADLs, coordination, sensation, ROM, strength, pain, Fine motor control, Gross motor control, mobility, balance, body mechanics, endurance, decreased knowledge of precautions, decreased knowledge of use of DME, vision, and UE functional use and psychosocial skills including coping strategies, environmental adaptation, and routines and behaviors.     PLAN:  OT FREQUENCY: 2x/week  OT DURATION: 8 weeks  PLANNED INTERVENTIONS: 97168 OT Re-evaluation, 97535 self care/ADL training, 13244 therapeutic exercise, 97530 therapeutic activity, 97112 neuromuscular re-education, 97035 ultrasound, 97010 moist heat, 97010 cryotherapy, 97760 Orthotics management and training, balance training, functional mobility training, compression bandaging, visual/perceptual remediation/compensation, psychosocial skills training, energy conservation, coping strategies training, patient/family education, and DME and/or AE instructions  RECOMMENDED OTHER SERVICES: NA  CONSULTED  AND AGREED WITH PLAN OF CARE: Patient  PLAN FOR NEXT SESSION: continue coordination, reaching, standing activities; educate on LB dressing strategies; educate on visual and sensation compensatory strategies   Rosalio Loud, OTR/L 01/08/2023, 9:50 AM   [facility] at [facility] [address] [place], [place] [address] Phone # [phone] Fax # [phone]

## 2023-01-10 ENCOUNTER — Ambulatory Visit: Payer: BC Managed Care – PPO | Admitting: Physical Therapy

## 2023-01-10 ENCOUNTER — Other Ambulatory Visit (HOSPITAL_COMMUNITY): Payer: Self-pay | Admitting: *Deleted

## 2023-01-10 DIAGNOSIS — R1310 Dysphagia, unspecified: Secondary | ICD-10-CM

## 2023-01-15 ENCOUNTER — Ambulatory Visit: Payer: BC Managed Care – PPO

## 2023-01-15 ENCOUNTER — Encounter: Payer: BC Managed Care – PPO | Admitting: Occupational Therapy

## 2023-01-17 NOTE — Telephone Encounter (Addendum)
 Pt called, [employer] have not received office notes. [employer] has stopped short term benefits.   Please this is time sensitive  due to benefits have put on hold, have lost employment, and no income. Please fax office notes of last visit and expectation as far as recover to [phone]. Could you please take of this today? Would like a call back to confirm have been faxed to [employer]

## 2023-01-17 NOTE — Telephone Encounter (Signed)
 Stanton Kidney, please fax OV notes.

## 2023-01-18 ENCOUNTER — Encounter: Payer: BC Managed Care – PPO | Admitting: Occupational Therapy

## 2023-01-18 ENCOUNTER — Ambulatory Visit: Payer: Commercial Managed Care - HMO | Admitting: Occupational Therapy

## 2023-01-18 ENCOUNTER — Ambulatory Visit: Payer: Commercial Managed Care - HMO

## 2023-01-18 ENCOUNTER — Ambulatory Visit: Payer: BC Managed Care – PPO

## 2023-01-22 ENCOUNTER — Encounter: Payer: BC Managed Care – PPO | Admitting: Occupational Therapy

## 2023-01-22 ENCOUNTER — Ambulatory Visit: Payer: BC Managed Care – PPO

## 2023-01-22 NOTE — Telephone Encounter (Signed)
 Forms completed and placed in Dr Marlis Edelson office to have him sign. Once completed will provide to Stanton Kidney in medical records to send in for the patient.

## 2023-01-24 ENCOUNTER — Encounter: Payer: BC Managed Care – PPO | Admitting: Occupational Therapy

## 2023-01-24 ENCOUNTER — Ambulatory Visit: Payer: BC Managed Care – PPO

## 2023-01-29 ENCOUNTER — Ambulatory Visit: Payer: BC Managed Care – PPO

## 2023-01-29 ENCOUNTER — Encounter: Payer: BC Managed Care – PPO | Admitting: Occupational Therapy

## 2023-02-01 ENCOUNTER — Ambulatory Visit: Payer: BC Managed Care – PPO | Admitting: Physical Therapy

## 2023-02-01 ENCOUNTER — Encounter: Payer: BC Managed Care – PPO | Admitting: Occupational Therapy

## 2023-02-04 ENCOUNTER — Ambulatory Visit: Payer: BC Managed Care – PPO | Attending: Neurology

## 2023-02-04 DIAGNOSIS — I480 Paroxysmal atrial fibrillation: Secondary | ICD-10-CM

## 2023-02-04 DIAGNOSIS — I639 Cerebral infarction, unspecified: Secondary | ICD-10-CM

## 2023-02-04 DIAGNOSIS — I491 Atrial premature depolarization: Secondary | ICD-10-CM

## 2023-02-05 ENCOUNTER — Ambulatory Visit: Payer: BC Managed Care – PPO | Admitting: Physical Therapy

## 2023-02-05 ENCOUNTER — Encounter: Payer: BC Managed Care – PPO | Admitting: Occupational Therapy

## 2023-02-07 ENCOUNTER — Encounter: Payer: BC Managed Care – PPO | Admitting: Occupational Therapy

## 2023-02-07 ENCOUNTER — Ambulatory Visit: Payer: BC Managed Care – PPO | Admitting: Physical Therapy

## 2023-02-11 ENCOUNTER — Encounter: Payer: BC Managed Care – PPO | Admitting: Occupational Therapy

## 2023-02-11 ENCOUNTER — Ambulatory Visit: Payer: BC Managed Care – PPO | Admitting: Physical Therapy

## 2023-02-13 ENCOUNTER — Telehealth: Payer: Self-pay | Admitting: *Deleted

## 2023-02-13 ENCOUNTER — Encounter: Payer: Self-pay | Admitting: Neurology

## 2023-02-13 NOTE — Progress Notes (Signed)
Kindly inform the patient that heart monitor study did not show any evidence of atrial fibrillation or worrisome cardiac arrhythmia.

## 2023-02-13 NOTE — Telephone Encounter (Signed)
I believe I completed these and submitted to Stanton Kidney in medical records. I have a copy of the information if needed.

## 2023-02-13 NOTE — Telephone Encounter (Signed)
Pt [employer] form faxed 2 times to [phone]

## 2023-02-13 NOTE — Telephone Encounter (Addendum)
Pt calling to check on short term disability forms. Do not have any financial support since December 30. Please can you get this done for me as soon  as possible.

## 2023-02-14 ENCOUNTER — Encounter (HOSPITAL_COMMUNITY): Payer: BC Managed Care – PPO

## 2023-02-14 ENCOUNTER — Encounter: Payer: BC Managed Care – PPO | Admitting: Occupational Therapy

## 2023-02-14 ENCOUNTER — Ambulatory Visit: Payer: BC Managed Care – PPO

## 2023-02-14 NOTE — Telephone Encounter (Signed)
Spoke to pt made her aware since she is still having shortness  of breath and heart racing . Per Sharrie Rothman since cardiac monitor results did not show any evidence of atrial fibrillation or worrisome cardiac arrhythmia. Overall nothing concerning. Please contact PCP ASAP to make them aware and see if Cardiologist referral is needed  Pt states has appointment with PCP in a week . Pt expressed understanding and thanked me for calling

## 2023-02-18 ENCOUNTER — Ambulatory Visit: Payer: BC Managed Care – PPO | Admitting: Physical Therapy

## 2023-02-18 ENCOUNTER — Encounter: Payer: BC Managed Care – PPO | Admitting: Occupational Therapy

## 2023-02-18 ENCOUNTER — Telehealth: Payer: Self-pay | Admitting: Cardiology

## 2023-02-18 NOTE — Telephone Encounter (Signed)
Patient scheduled to see Dr. Antoine Poche in May in [place] but wants to know if she needs to come in sooner based off of her heart monitor results. Please advise.

## 2023-02-18 NOTE — Telephone Encounter (Signed)
Monitor ordered by Dr. Pearlean Brownie.  She read there is nothing to be concerned about.   She would like MD to review and advise if she should be seen sooner.   She is aware MD is out of the office -- will review upon his return.

## 2023-02-19 DIAGNOSIS — Z0271 Encounter for disability determination: Secondary | ICD-10-CM

## 2023-02-19 NOTE — Therapy (Signed)
 OUTPATIENT PHYSICAL THERAPY NEURO RE-CERT   Patient Name: Joan Robinson MRN: [id number] DOB:November 22, 1960, 63 y.o., female Today's Date: 02/20/2023   PCP: Teresa Aldona CROME, NP REFERRING PROVIDER: Rosemarie Eather RAMAN, MD   END OF SESSION:  PT End of Session - 02/20/23 1058     Visit Number 3    Number of Visits 9    Date for PT Re-Evaluation 04/03/23    Authorization Type [employer]    Authorization - Visit Number 1    Authorization - Number of Visits 10   30 combined   PT Start Time 1017    PT Stop Time 1058    PT Time Calculation (min) 41 min    Equipment Utilized During Treatment Gait belt    Activity Tolerance Patient tolerated treatment well    Behavior During Therapy WFL for tasks assessed/performed               Past Medical History:  Diagnosis Date   Allergic rhinitis    Anemia 10/10/2011   Anxiety and depression 01/17/2007   Qualifier: Diagnosis of  By: Kassie MD, Sean A    Arthritis 07/24/2013   Likely inflammatory and following with Dr Sissy of [facility]  rheumatology   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   BCC (basal cell carcinoma of skin) 06/01/2012   Leg Follows with Dr Shona   Bipolar disorder (HCC) 01/17/2007   Qualifier: Diagnosis of  By: Kassie MD, Sean A    Cataract    Diverticulosis    Diverticulosis    Emphysema of lung (HCC)    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   Gallstones    GERD (gastroesophageal reflux disease)    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   Hashimoto's disease    Hyperlipidemia    Hypertension    Hypothyroidism 08/24/2006   Qualifier: Diagnosis of  By: Kassie MD, Sean A     IBS (irritable bowel syndrome) 07/27/2016   Obesity 11/01/2013   PUD (peptic ulcer disease)    Sleep apnea 04/27/2016   Tobacco abuse    Past Surgical History:  Procedure Laterality Date   ABDOMINAL HYSTERECTOMY     ABDOMINAL HYSTERECTOMY  01/15/2009   complete   COLONOSCOPY     DILATION AND CURETTAGE OF UTERUS  01/16/1983   EYE SURGERY  03/03/2014    Surgery on both eyes for epiretinal membrane (vitreous peel)   Gated Spect wall motion stress cardiolite  11/05/2001   HIATAL HERNIA REPAIR N/A 07/12/2021   Procedure: LAPAROSCOPY W/ EXTENSIVE FOREGUT DISSECTION; PARTIAL STOMACH REDUCTION; GASTROSTOMY TUBE PLACEMENT; GASTROPEXY;  Surgeon: Gladis Cough, MD;  Location: [facility];  Service: General;  Laterality: N/A;   LAPAROSCOPIC RIGHT HEMI COLECTOMY Right 10/16/2022   Procedure: LAPAROSCOPIC ASSISTED RIGHT HEMI COLECTOMY;  Surgeon: Teresa Lonni HERO, MD;  Location: [facility];  Service: General;  Laterality: Right;   POLYPECTOMY     TUBAL LIGATION  01/15/1993   UTERINE SUSPENSION     mesh   VITRECTOMY Bilateral 03/03/2014   XI ROBOTIC ASSISTED HIATAL HERNIA REPAIR N/A 05/01/2021   Procedure: XI ROBOTIC ASSISTED TYPE III HIATAL HERNIA REPAIR WITH FUNDOPLICATION;  Surgeon: Gladis Cough, MD;  Location: [facility];  Service: General;  Laterality: N/A;   Patient Active Problem List   Diagnosis Date Noted   Dysphagia 11/05/2022   Vestibular dizziness 11/05/2022   Malnutrition of moderate degree 11/01/2022   Acute stroke due to ischemia [facility]) 10/25/2022   CVA (cerebral vascular accident) (HCC) 10/25/2022   Protein-calorie malnutrition, severe 10/19/2022   Diverticulosis  10/14/2022   History of emphysema (HCC) 10/14/2022   SBO (small bowel obstruction) (HCC) 10/13/2022   Aortic valve calcification 09/20/2022   Abdominal aortic atherosclerosis (HCC) 09/20/2022   Hiatal hernia 07/12/2021   Status post laparoscopic Nissen fundoplication 05/01/2021   IBS (irritable bowel syndrome) 07/27/2016   High-tone pelvic floor dysfunction 05/09/2016   Midline cystocele 05/09/2016   Vaginal atrophy 05/09/2016   Bladder prolapse, female, acquired 05/07/2016   Dystrophic nail 04/29/2016   Fatigue 04/27/2016   Sleep apnea 04/27/2016   UTI (urinary tract infection) 09/11/2015   Cataract cortical, senile, left 12/20/2014   Rectal lesion 10/10/2014   Other type  of migraine without status migrainosus 05/06/2014   Dyspepsia 05/06/2014   Postmenopausal estrogen deficiency 05/06/2014   Screening for breast cancer 05/06/2014   Bilateral low-tension glaucoma, indeterminate stage 12/29/2013   High myopia, bilateral 12/29/2013   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   Obesity 11/01/2013   Eustachian tube dysfunction 10/08/2013   Otalgia of left ear 10/08/2013   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   Arthritis 07/24/2013   Anxiety attack 03/08/2013   Paresthesia 12/22/2012   Headache 08/17/2012   BCC (basal cell carcinoma of skin) 06/01/2012   Left arm pain 05/10/2012   Right knee pain 05/10/2012   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   ADD (attention deficit disorder) 04/13/2012   Perimenopausal 01/16/2012   Macular pucker of both eyes 10/18/2011   Vitreous degeneration 10/18/2011   Chronic anemia 10/10/2011   Preventative health care 10/10/2011   Urticaria, chronic 10/10/2011   Macular pucker, bilateral 10/10/2011   Lower back pain    DIVERTICULITIS-COLON 07/25/2009   Diverticulitis of colon 07/25/2009   Hyperlipidemia 06/09/2009   Essential hypertension 06/09/2009   History of esophageal stricture 09/06/2008   GERD (gastroesophageal reflux disease) 07/27/2008   Bipolar disorder (HCC) 01/17/2007   Hypothyroidism 08/24/2006   Allergic rhinitis 08/24/2006   URINARY INCONTINENCE 08/24/2006    ONSET DATE: 10/13/2022  REFERRING DIAG: I63.9 (ICD-10-CM) - Brainstem stroke (HCC)   THERAPY DIAG:  Unsteadiness on feet  Other abnormalities of gait and mobility  Muscle weakness (generalized)  Hemiplegia and hemiparesis following cerebral infarction affecting left non-dominant side (HCC)  Rationale for Evaluation and Treatment: Rehabilitation  SUBJECTIVE:                                                                                                                                                                                              SUBJECTIVE STATEMENT: Lost her insurance so she was not able to come to therapy. Husband hurt his back and has not been able  to lift the w/c, so she is using the 4WW more. Denies falls. Had L eye surgery and sees surgeon tomorrow. Cornea specialist next week. Reports double vision if she uncovers the L eye. Reports that she is very fearful of falling.   Pt accompanied by: self  PERTINENT HISTORY: Per Dr. Rosemarie office note 12/11/2022:  PMH of esophageal strictures status post dilation, multiple hernias status post multiple surgeries, GERD/PUD, hypertension, hyperlipidemia, autoimmune urticaria, emphysema, sleep apnea who presents to the ED on 10/13/2022 with right upper quadrant and right lower quadrant abdominal pain x 2 days associated nausea.  She was found to have small bowel obstruction and was being managed inpatient at [facility].  Around shift change, patient was noted to have left facial droop, slurring of her speech and upon further questioning, symptoms were first noted at 1700 on 10/14/2022.  Her team had difficulty establishing a last known well.  However, symptoms were first noted at 1700.  A code stroke was activated if she had a CT head without contrast which was notable for a moderately large left PICA territory infarct.  She was eval by teleneurology.  She was not given TNKase due to unclear last known well and the fact that the stroke looks completed.  CT angio head and neck with and without contrast was negative for any large vessel occlusion.  Specifically, no basilar occlusion or thrombosis noted.  CT perfusion with no core or mismatch noted.  Given the moderately large posterior fossa stroke which is in proximity to brainstem, and the extension of the stroke into the brainstem, she was transferred to [facility] medical ICU for immediate in person neurology evaluation and for close monitoring for signs of posterior fossa crowding and  potential herniation.  She was monitored carefully in the ICU and remained stable and did not require any neurosurgical intervention.  MRI showed a large acute infarct involving both left PICA and Icar territory with mass effect on posterior fossa and fourth ventricle mild hydrocephalus.  2D echo showed ejection fraction of 60 to 65%.  Left atrial size was normal.  LDL cholesterol 54 mg percent.  Hemoglobin A1c was 5.6.  Patient had significant left peripheral facial nerve weakness as well as dysphagia and left hemiataxia.  She was seen by physical Occupational Therapy transfer to inpatient rehab where she stayed for a month.  She made gradual improvement in his been home now for nearly 4 weeks.  She still has significant left facial weakness with dryness of the left eye with redness.  She is unable to close her left eye.  She does have an upcoming appointment to see ophthalmologist next week.  She is still not gotten any hearing back in the left ear.  She still has trouble swallowing has to swallow mostly on the right side.  She has a lot of drooling from the left corner of the mouth.  She still has some left-sided incoordination and she is working with therapist has shown some improvement.  She is still unable to walk on her own and requires 1 person assist with gait belt to walk with a wheelchair.  She is tolerating Plavix  with minor bruising but no bleeding.  Patient is clearly significantly disabled and is currently on short-term disability and thinking about going on a long disability.  Patient also has a new complaint of neck pain and right arm numbness.  Her primary care physician has ordered an MRI of the C-spine is scheduled for next week for  this.  Last CT head on 10/28/2022 had shown decrease mass effect in the posterior fossa with improved patency of the fourth ventricle.    PAIN:  Are you having pain?  Reports pin pricks in RUE and LE; pt seeing OT  PRECAUTIONS: Fall and Other: vision changes     Had L eye surgery and sees surgeon tomorrow. Cornea specialist next week. Reports double vision if she uncovers the L eye. 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 Pt reports no lifting >gallon of milk for 2 weeks(pt reports this has been lifted)  RED FLAGS: None   WEIGHT BEARING RESTRICTIONS: No  FALLS: Has patient fallen in last 6 months? No  LIVING ENVIRONMENT: Lives with: lives with their family and daughter comes over to help as husband can't be primary caregiver Lives in: House/apartment Stairs:  6 steps to front of home with L rail ascending Has following equipment at home: Single point cane, Environmental Consultant - 2 wheeled, Environmental Consultant - 4 wheeled, and Wheelchair (manual)  PLOF: Independent; enjoyed gardening, office work; took care of family  PATIENT GOALS: Pt's goal is to get back to being better to help with my family.  OBJECTIVE:    TODAY'S TREATMENT: 02/20/23 Activity Comments  TUG 23.42 sec with 4WW  5xSTS 12.54 sec without UEs and decreased eccentric control   Berg 45/56  47M walk 21.44 sec with 4WW (1.53 ft/sec )           [facility] PT Assessment - 02/20/23 0001       Berg Balance Test   Sit to Stand Able to stand without using hands and stabilize independently    Standing Unsupported Able to stand safely 2 minutes    Sitting with Back Unsupported but Feet Supported on Floor or Stool Able to sit safely and securely 2 minutes    Stand to Sit Sits safely with minimal use of hands    Transfers Able to transfer safely, minor use of hands    Standing Unsupported with Eyes Closed Able to stand 10 seconds safely    Standing Unsupported with Feet Together Able to place feet together independently and stand 1 minute safely    From Standing, Reach Forward with Outstretched Arm Can reach confidently >25 cm (10)    From Standing Position, Pick up Object from Floor Able to pick up shoe, needs supervision    From Standing Position, Turn to Look Behind Over each  Shoulder Needs supervision when turning    Turn 360 Degrees Able to turn 360 degrees safely but slowly    Standing Unsupported, Alternately Place Feet on Step/Stool Able to stand independently and safely and complete 8 steps in 20 seconds    Standing Unsupported, One Foot in Front Able to take small step independently and hold 30 seconds    Standing on One Leg Tries to lift leg/unable to hold 3 seconds but remains standing independently    Total Score 45               PATIENT EDUCATION: Education details: edu on exam findings and progress/remaining deficits towards goals, answered pt's questions about safety with [phone] and how to increase walking endurance; advised to try walking for time inside with 4WW first to test endurance before trying to walk outside  Person educated: Patient Education method: Explanation Education comprehension: verbalized understanding     HOME EXERCISE PROGRAM Last updated: 01/08/23 Access Code: [email or web address] URL: [email or web address] Date: 01/08/2023 Prepared by: [facility] -  Outpatient  Rehab - [facility]  Exercises - Sit to Stand Without Arm Support  - 1 x daily - 5 x weekly - 2 sets - 10 reps - Heel Toe Raises with Counter Support  - 1 x daily - 5 x weekly - 2 sets - 10 reps - Alternating Step Forward with Support  - 1 x daily - 5 x weekly - 2 sets - 10 reps - Sitting Knee Extension with Resistance  - 1 x daily - 5 x weekly - 2 sets - 10 reps - Seated Hamstring Curl with Anchored Resistance  - 1 x daily - 5 x weekly - 2 sets - 10 reps     --------------------------------------------------------------- Note: Objective measures below were completed at Evaluation unless otherwise noted.  DIAGNOSTIC FINDINGS: See above from CVA; MRI upcoming for RUE numbness and cervical pain  COGNITION: Overall cognitive status: Within functional limits for tasks assessed   SENSATION: Light touch: WFL except for R hand not being able to  discern hot/cold  COORDINATION: Slowed alt toe taps, slowed heel to shin (R more than L)  MUSCLE TONE: LLE: Mild  POSTURE: rounded shoulders and forward head Sits in posterior pelvic tilt  LOWER EXTREMITY ROM:   AROM WFL BLEs  LOWER EXTREMITY MMT:    MMT Right Eval Left Eval  Hip flexion 4 4+  Hip extension    Hip abduction 4+ 4+  Hip adduction 4+ 4+  Hip internal rotation    Hip external rotation    Knee flexion 4 4+  Knee extension 4 4+  Ankle dorsiflexion 4+ 4+  Ankle plantarflexion    Ankle inversion 4 4  Ankle eversion 4 4  (Blank rows = not tested)   TRANSFERS: Assistive device utilized: None  Sit to stand: SBA Stand to sit: SBA  GAIT: Gait pattern: step to pattern, step through pattern, decreased step length- Right, decreased step length- Left, and narrow BOS Distance walked: 35 ft x 2 Assistive device utilized: Walker - 2 wheeled Level of assistance: CGA and Min A Comments: 38.54 sec 20 ft (0.52 ft/sec)  FUNCTIONAL TESTS:  5 times sit to stand: 27.75 sec with Ues; performs sit<>stand x 1 with arms crossed at chest Timed up and go (TUG): 45.94 sec with RW  PATIENT SURVEYS:  FOTO 56; predicted 67  TODAY'S TREATMENT:                                                                                                                              DATE: 12/31/2022    PATIENT EDUCATION: Education details:  Eval results, POC Person educated: Patient Education method: Explanation Education comprehension: verbalized understanding  HOME EXERCISE PROGRAM: Not yet initiated  GOALS: Goals reviewed with patient? Yes  SHORT TERM GOALS: Target date: 02/01/2023  Pt will be independent with HEP for improved strength, balance, functional mobility. Baseline: NT 02/20/23 Goal status: IN PROGRESS  2.  Pt will improve 5x sit<>stand to  less than or equal to 20 sec to demonstrate improved functional strength and transfer efficiency. Baseline: 27.75 sec with BUE  support; 12.54 sec without UEs and decreased eccentric control 02/20/23 Goal status: MET 02/20/23  3.  Pt will improve TUG score to less than or equal to 35 sec for decreased fall risk. Baseline: 23.42 sec 02/20/23 Goal status: MET 02/20/23  4.  Berg score to improve by 7 points from baseline, for decreased fall risk. Baseline: 37>45 02/20/23 Goal status:MET 02/20/23  5.  Pt will ambulate at least 500 ft outdoor community surfaces, with RW, supervision.  Baseline:  RW with CGA/min; NT 02/20/23  Goals status: IN PROGRESS     LONG TERM GOALS: Target date: 04/03/2023  Pt will be independent with HEP for improved strength, balance, gait. Baseline:  NT 02/20/23 Goal status: IN PROGRESS  2.  Pt will improve 5x sit<>stand to less than or equal to 12.5 sec to demonstrate improved functional strength and transfer efficiency. Baseline: 12.5 sec 02/20/23 Goal status:MET 02/20/23  3.  Pt will improve TUG score to less than or equal to 20 sec for decreased fall risk. Baseline: 23.42 sec 02/20/23 Goal status: IN PROGRESS 02/20/23  4.  Pt will improve gait velocity to at least 1.3 ft/sec for improved gait efficiency and safety.  Baseline: 1.53 ft/sec 02/20/23 Goal status:MET 02/20/23  5.  Pt will ambulate 200 ft, household and short community distances, mod I, with least restrictive assistive device (cane vs. Walker) Baseline:  NT 02/20/23 Goal status: IN PROGRESS  6.  FOTO score to improve to 67 to demo improved overall functional mobility.  Baseline:  56 NT 02/20/23  Goal status:  IN PROGRESS  7. Patient to score at least 50/56 on Berg in order to decrease falls risk.  Baseline:  45 02/20/23  Goal status:  INITIAL   7. Patient to report ability to access local community such as church and grocery store with LRAD.  Baseline:  reports using w/c outside of house 02/20/23  Goal status:  INITIAL  ASSESSMENT:  CLINICAL IMPRESSION: Patient arrived to session with report of not returning to therapy d/t loss of  insurance. Reports no significant medical changes today, however using 4WW today rather than w/c. Patient cites significant fear of falling which is limiting her ability to access environment outside of her home.  Patient has significantly improved gait speed, transfer speed, and balance since initial testing. However, TUG and Berg tests still indicate an increased risk of falls. Goals were updated to reflect patient's current challenges including balance confidence to access her community. Patient would benefit from additional skilled PT services 1x/week for 6 weeks to address remaining goals.   OBJECTIVE IMPAIRMENTS: Abnormal gait, decreased balance, decreased mobility, difficulty walking, decreased strength, impaired tone, impaired vision/preception, and postural dysfunction.   ACTIVITY LIMITATIONS: carrying, lifting, standing, squatting, stairs, transfers, bathing, toileting, dressing, reach over head, hygiene/grooming, locomotion level, and caring for others  PARTICIPATION LIMITATIONS: meal prep, cleaning, laundry, interpersonal relationship, driving, shopping, community activity, occupation, and church  PERSONAL FACTORS: 3+ comorbidities: See PMH above  are also affecting patient's functional outcome.   REHAB POTENTIAL: Good  CLINICAL DECISION MAKING: Evolving/moderate complexity  EVALUATION COMPLEXITY: Moderate  PLAN:  PT FREQUENCY: 1x/week for 6 weeks  PT DURATION: other: -  PLANNED INTERVENTIONS: 97110-Therapeutic exercises, 97530- Therapeutic activity, [id number]- Neuromuscular re-education, 97535- Self Care, [id number]- Manual therapy, [phone]- Gait training, [phone]- Orthotic Fit/training, [phone]- Aquatic Therapy, Patient/Family education, Balance training, Stair training, DME instructions, and Wheelchair mobility training  PLAN FOR NEXT SESSION: review and update HEP to reflect current abilities, progress narrow BOS balance, balance with turns, trial cane? Work on walking endurance and  improving balance confidence     Louana Terrilyn Christians, [place], DPT 02/20/23 12:05 PM  [facility] Rehab at [facility] [address] [place], [address] [place], [place] [address] Phone # [phone] Fax # [phone]

## 2023-02-20 ENCOUNTER — Ambulatory Visit: Payer: Commercial Managed Care - HMO | Attending: Neurology

## 2023-02-20 ENCOUNTER — Ambulatory Visit: Payer: Commercial Managed Care - HMO | Admitting: Occupational Therapy

## 2023-02-20 ENCOUNTER — Encounter: Payer: Self-pay | Admitting: Physical Therapy

## 2023-02-20 ENCOUNTER — Ambulatory Visit: Payer: Commercial Managed Care - HMO | Admitting: Physical Therapy

## 2023-02-20 DIAGNOSIS — R278 Other lack of coordination: Secondary | ICD-10-CM | POA: Insufficient documentation

## 2023-02-20 DIAGNOSIS — R1312 Dysphagia, oropharyngeal phase: Secondary | ICD-10-CM | POA: Diagnosis present

## 2023-02-20 DIAGNOSIS — M6281 Muscle weakness (generalized): Secondary | ICD-10-CM | POA: Diagnosis present

## 2023-02-20 DIAGNOSIS — R471 Dysarthria and anarthria: Secondary | ICD-10-CM | POA: Diagnosis present

## 2023-02-20 DIAGNOSIS — I69354 Hemiplegia and hemiparesis following cerebral infarction affecting left non-dominant side: Secondary | ICD-10-CM | POA: Diagnosis present

## 2023-02-20 DIAGNOSIS — R41842 Visuospatial deficit: Secondary | ICD-10-CM | POA: Diagnosis present

## 2023-02-20 DIAGNOSIS — R2681 Unsteadiness on feet: Secondary | ICD-10-CM

## 2023-02-20 DIAGNOSIS — R208 Other disturbances of skin sensation: Secondary | ICD-10-CM

## 2023-02-20 DIAGNOSIS — R2689 Other abnormalities of gait and mobility: Secondary | ICD-10-CM | POA: Diagnosis present

## 2023-02-20 NOTE — Therapy (Signed)
 OUTPATIENT SPEECH LANGUAGE PATHOLOGY TREATMENT/RECERTIFICAITON   Patient Name: Joan Robinson MRN: [id number] DOB:1960/04/14, 63 y.o., female Today's Date: 02/20/2023  PCP: Teresa Georgi, MD REFERRING PROVIDER:  Rosemarie Eather RAMAN, MD     END OF SESSION:  End of Session - 02/20/23 0930     Visit Number 3    Number of Visits 17    Date for SLP Re-Evaluation 04/19/23    SLP Start Time 0851    SLP Stop Time  0930    SLP Time Calculation (min) 39 min    Activity Tolerance Patient tolerated treatment well              Past Medical History:  Diagnosis Date   Allergic rhinitis    Anemia 10/10/2011   Anxiety and depression 01/17/2007   Qualifier: Diagnosis of  By: Kassie MD, Sean A    Arthritis 07/24/2013   Likely inflammatory and following with Dr Sissy of [facility]  rheumatology   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   BCC (basal cell carcinoma of skin) 06/01/2012   Leg Follows with Dr Shona   Bipolar disorder (HCC) 01/17/2007   Qualifier: Diagnosis of  By: Kassie MD, Sean A    Cataract    Diverticulosis    Diverticulosis    Emphysema of lung (HCC)    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   Gallstones    GERD (gastroesophageal reflux disease)    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   Hashimoto's disease    Hyperlipidemia    Hypertension    Hypothyroidism 08/24/2006   Qualifier: Diagnosis of  By: Kassie MD, Sean A     IBS (irritable bowel syndrome) 07/27/2016   Obesity 11/01/2013   PUD (peptic ulcer disease)    Sleep apnea 04/27/2016   Tobacco abuse    Past Surgical History:  Procedure Laterality Date   ABDOMINAL HYSTERECTOMY     ABDOMINAL HYSTERECTOMY  01/15/2009   complete   COLONOSCOPY     DILATION AND CURETTAGE OF UTERUS  01/16/1983   EYE SURGERY  03/03/2014   Surgery on both eyes for epiretinal membrane (vitreous peel)   Gated Spect wall motion stress cardiolite  11/05/2001   HIATAL HERNIA REPAIR N/A 07/12/2021   Procedure: LAPAROSCOPY W/ EXTENSIVE FOREGUT  DISSECTION; PARTIAL STOMACH REDUCTION; GASTROSTOMY TUBE PLACEMENT; GASTROPEXY;  Surgeon: Gladis Cough, MD;  Location: [facility];  Service: General;  Laterality: N/A;   LAPAROSCOPIC RIGHT HEMI COLECTOMY Right 10/16/2022   Procedure: LAPAROSCOPIC ASSISTED RIGHT HEMI COLECTOMY;  Surgeon: Teresa Lonni HERO, MD;  Location: [facility] OR;  Service: General;  Laterality: Right;   POLYPECTOMY     TUBAL LIGATION  01/15/1993   UTERINE SUSPENSION     mesh   VITRECTOMY Bilateral 03/03/2014   XI ROBOTIC ASSISTED HIATAL HERNIA REPAIR N/A 05/01/2021   Procedure: XI ROBOTIC ASSISTED TYPE III HIATAL HERNIA REPAIR WITH FUNDOPLICATION;  Surgeon: Gladis Cough, MD;  Location: [facility];  Service: General;  Laterality: N/A;   Patient Active Problem List   Diagnosis Date Noted   Dysphagia 11/05/2022   Vestibular dizziness 11/05/2022   Malnutrition of moderate degree 11/01/2022   Acute stroke due to ischemia [facility]) 10/25/2022   CVA (cerebral vascular accident) (HCC) 10/25/2022   Protein-calorie malnutrition, severe 10/19/2022   Diverticulosis 10/14/2022   History of emphysema (HCC) 10/14/2022   SBO (small bowel obstruction) (HCC) 10/13/2022   Aortic valve calcification 09/20/2022   Abdominal aortic atherosclerosis (HCC) 09/20/2022   Hiatal hernia 07/12/2021   Status post laparoscopic Nissen fundoplication 05/01/2021   IBS (  irritable bowel syndrome) 07/27/2016   High-tone pelvic floor dysfunction 05/09/2016   Midline cystocele 05/09/2016   Vaginal atrophy 05/09/2016   Bladder prolapse, female, acquired 05/07/2016   Dystrophic nail 04/29/2016   Fatigue 04/27/2016   Sleep apnea 04/27/2016   UTI (urinary tract infection) 09/11/2015   Cataract cortical, senile, left 12/20/2014   Rectal lesion 10/10/2014   Other type of migraine without status migrainosus 05/06/2014   Dyspepsia 05/06/2014   Postmenopausal estrogen deficiency 05/06/2014   Screening for breast cancer 05/06/2014   Bilateral low-tension glaucoma,  indeterminate stage 12/29/2013   High myopia, bilateral 12/29/2013   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   Obesity 11/01/2013   Eustachian tube dysfunction 10/08/2013   Otalgia of left ear 10/08/2013   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   Arthritis 07/24/2013   Anxiety attack 03/08/2013   Paresthesia 12/22/2012   Headache 08/17/2012   BCC (basal cell carcinoma of skin) 06/01/2012   Left arm pain 05/10/2012   Right knee pain 05/10/2012   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   ADD (attention deficit disorder) 04/13/2012   Perimenopausal 01/16/2012   Macular pucker of both eyes 10/18/2011   Vitreous degeneration 10/18/2011   Chronic anemia 10/10/2011   Preventative health care 10/10/2011   Urticaria, chronic 10/10/2011   Macular pucker, bilateral 10/10/2011   Lower back pain    DIVERTICULITIS-COLON 07/25/2009   Diverticulitis of colon 07/25/2009   Hyperlipidemia 06/09/2009   Essential hypertension 06/09/2009   History of esophageal stricture 09/06/2008   GERD (gastroesophageal reflux disease) 07/27/2008   Bipolar disorder (HCC) 01/17/2007   Hypothyroidism 08/24/2006   Allergic rhinitis 08/24/2006   URINARY INCONTINENCE 08/24/2006   Speech Therapy Progress Note  Dates of Reporting Period: 12/31/22  to present  Subjective Statement: PT only seen for 3 sessions focusing on dysarthria and dysphagia due to losing her insurance at end of December 2024.   Objective: See below.   Goal Update: See below. STG and LTG end dates were extended.  Plan: See below.  Reason Skilled Services are Required: Pt has not maximized potential yet. MBS is at end of February 2025.   ONSET DATE: September 2024   REFERRING DIAG:  I63.9 (ICD-10-CM) - Brainstem stroke (HCC)    THERAPY DIAG:  Dysphagia, oropharyngeal phase - Plan: SLP plan of care cert/re-cert  Dysarthria and anarthria - Plan: SLP plan of care cert/re-cert  Rationale for  Evaluation and Treatment: Rehabilitation  SUBJECTIVE:   SUBJECTIVE STATEMENT: I lost my insurance on 01/15/23. I had to get  new insurance.  Pt accompanied by: self  PERTINENT HISTORY: 63 yo admitted 9/28 with RUQ pain with SBO. 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 PMhx: HTN, HLD, peptic ulcer disease, diverticulitis, bipolar disorder, hypothyroidism, GERD, Hashimoto's disease. Hemicolectomy completed on 10/1.  PAIN:  Are you having pain? RUE pain - see OT eval    FALLS: Has patient fallen in last 6 months?  No  PATIENT GOALS: Improve speech and swallowing skills  OBJECTIVE:  Note: Objective measures were completed at Evaluation unless otherwise noted.  DIAGNOSTIC FINDINGS:  MRI 10/15/22 IMPRESSION: Large acute infarct (affecting both the left PICA and left AICA vascular territories) as described. Posterior fossa mass effect with effacement of the fourth ventricle inferiorly. No evidence of obstructive hydrocephalus at this time. Mild inferior displacement of the cerebellar tonsils (which extend up to 4 mm below the level of the foramen magnum). No more than mild mass effect  upon the medulla at this time. Close imaging follow-up is recommended to monitor the evolving posterior fossa mass effect.   RECOMMENDATIONS FROM OBJECTIVE SWALLOW STUDY (MBSS/FEES):   Swallowing Evaluation Recommendations (11/05/22) Dysphagia 2 (Finely chopped); Thin liquids (Level 0) Liquid Administration via: Cup; Spoon; Straw Medication Administration: Crushed with puree Supervision: Full supervision/cueing for swallowing strategies; Patient able to self-feed Objective swallow impairments: Clinical Impression: Pt presents with moderate oral and mild pharyngeal dysphagia. Oral phase characterized by reduced labial seal (resulting in anterior bolus escape beyond the mid chin with thin liquids from teaspoon), impaired/slowed  mastication, slowed AP transit, and mild-moderate oral residue after the initial swallow. Patient sensate to residue and works to clear independently. Pharyngeally, deficits characterized by reduced pharyngeal constriction and reduced BOT retraction to the posterior pharyngeal wall resulting in trace-mild residue in the vallecula with all trialed consistencies. Both orally and pharyngeally, amount of residue increases alongside bolus viscosity. Patient continues to present with trismus that makes oral acceptance of larger bites of food difficult, though this is improving. No significant penetration/aspiration observed throughout all trials. Straw bolus acceptance minimized anterior bolus loss and adding an additional dry swallow after solids mitigated residue in the vallecula. Recommend diet upgrade to Dys2/thin liquid diet, adding additional swallow intermittently to clear residue. Family updated to results and in agreement with plan. Objective recommended compensations:  Swallowing strategies: Slow rate; Check for pocketing or oral holding; Check for anterior loss; Multiple dry swallows after each bite/sip Postural changes: Position pt fully upright for meals; Stay upright 30-60 min after meals Oral care recommendations: Oral care BID (2x/day)   PATIENT REPORTED OUTCOME MEASURES (PROM): EAT-10:   and Speech - QOL completed this session. EAT-10 was 21/40 with higher scores  indicating worse effect of swallow on QOL. Speech QOL score was 25/30 with higher scores indicating worse effect of speech on pt's QOL.    TODAY'S TREATMENT:                                                                                                                                         DATE:  02/20/23: Pt has progressed to soft foods - she is finding what foods don't work more by trial and error. Crumbly foods and vinegar sauces cause pt to cough at this time. MBS scheduled for later this month due to pt losing her insurance  and when obtained that was earliest available. Today SLP assessed pt with fig bar and water  with straw. Pt req'd occasional min A for recall of sip of liqiud after solids. She tipped her bottle/straw upwards and had multiple sips and coughed. SLP suggested single sips with head to side instead of tilted back. SLP provided pt with dysphagia exercises (see pt instructions) and pt was independent by session end.   01/08/23: PROMs completed today. SLP addressed two more trismus exercises with pt today. She req'd min-mod A rarely, faded to  modified independence. SLP and pt discussed rationale for swallow assessment (MBS) and pt agreed she needed this assessment completed.   12/31/22 (eval): SLP educated/reviewed three labial exercises with pt and provided handout. SLP role in rehab process was explained. Reviewed pt's swallow strategies with her.  PATIENT EDUCATION: Education details: see today's treatment Person educated: Patient Education method: Explanation, Demonstration, Verbal cues, and Handouts Education comprehension: verbalized understanding, returned demonstration, verbal cues required, and needs further education   GOALS: Goals reviewed with patient? No  SHORT TERM GOALS: Target date: 02/01/23, 03/19/23  Pt will perform dysarthria HEP, and dysphagia (if necessary) HEP with rare min A in two sessions Baseline: Goal status: INITIAL  2.  Pt will perform MBS to assess current swallow status Baseline:  Goal status: INITIAL  3.  Pt will follow swallow precautions from MBS with POs with rare min A in two sessions Baseline:  Goal status: INITIAL  4.  Pt will improve articulation in 10 minutes conversation with compensatory strategies with rare min A Baseline:  Goal status: INITIAL   LONG TERM GOALS: Target date: 03/01/23, 04/19/23  Pt will improve PROM compared to initial administration Baseline:  Goal status: INITIAL  2.  Pt will improve articulation in 10 minutes conversation  using compensatory strategies Baseline:  Goal status: INITIAL  3.  Pt will perform dysarthria HEP, and dysphagia (if necessary) HEP with modified independence in three sessions Baseline:  Goal status: INITIAL  4.  Pt will follow swallow precautions from MBS with POs with modified independence in 3 sessions Baseline:  Goal status: INITIAL   ASSESSMENT:  CLINICAL IMPRESSION: RECERT TODAY. Patient is a 63 y.o. F who was seen for swallowing and speech after a CVA in October 2024. Pt  follow up MBS is at end of February 2025. She was provided with dysphagia exercises today as she cont with limited sensation and limited/no labial or facial movement on lt side. Pt is drinking with a straw and reports lt labial sulcus residue which she clears with lingual and/or finger sweep due to lingual movement into sulcus is weaker at this time.  OBJECTIVE IMPAIRMENTS: include dysarthria and dysphagia. These impairments are limiting patient from ADLs/IADLs, effectively communicating at home and in community, and safety when swallowing. Factors affecting potential to achieve goals and functional outcome are severity of impairments and unknown prognosis for nerve recovery on lt side . Patient will benefit from skilled SLP services to address above impairments and improve overall function.  REHAB POTENTIAL: Good  PLAN:  SLP FREQUENCY: 2x/week  SLP DURATION: 8 weeks  PLANNED INTERVENTIONS: 92526 Treatment of swallowing function, 92507 Treatment of speech (30 or 45 min) , Re-evaluation, Aspiration precaution training, Pharyngeal strengthening exercises, Diet toleration management , Environmental controls, Trials of upgraded texture/liquids, Oral motor exercises, Functional tasks, Multimodal communication approach, SLP instruction and feedback, Compensatory strategies, Patient/family education, and MBS    Doraine Schexnider, CCC-SLP 02/20/2023, 9:36 AM

## 2023-02-20 NOTE — Therapy (Signed)
 OUTPATIENT OCCUPATIONAL THERAPY NEURO  Treatment Note & Re-cert  Patient Name: Joan Robinson MRN: [id number] DOB:20-Feb-1960, 63 y.o., female Today's Date: 02/20/2023  PCP: Teresa Aldona CROME, NP REFERRING PROVIDER: Rosemarie Eather RAMAN, MD  END OF SESSION:  OT End of Session - 02/20/23 0946     Visit Number 3    Number of Visits 17    Date for OT Re-Evaluation 04/05/23    Authorization Type [employer] [id number]    Authorization Time Period 30 visits combined OT/PT/ST    OT Start Time 0930    OT Stop Time 1012    OT Time Calculation (min) 42 min    Behavior During Therapy [employer] for tasks assessed/performed               Past Medical History:  Diagnosis Date   Allergic rhinitis    Anemia 10/10/2011   Anxiety and depression 01/17/2007   Qualifier: Diagnosis of  By: Kassie MD, Sean A    Arthritis 07/24/2013   Likely inflammatory and following with Dr Sissy of [facility]  rheumatology   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   BCC (basal cell carcinoma of skin) 06/01/2012   Leg Follows with Dr Shona   Bipolar disorder (HCC) 01/17/2007   Qualifier: Diagnosis of  By: Kassie MD, Sean A    Cataract    Diverticulosis    Diverticulosis    Emphysema of lung (HCC)    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   Gallstones    GERD (gastroesophageal reflux disease)    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   Hashimoto's disease    Hyperlipidemia    Hypertension    Hypothyroidism 08/24/2006   Qualifier: Diagnosis of  By: Kassie MD, Sean A     IBS (irritable bowel syndrome) 07/27/2016   Obesity 11/01/2013   PUD (peptic ulcer disease)    Sleep apnea 04/27/2016   Tobacco abuse    Past Surgical History:  Procedure Laterality Date   ABDOMINAL HYSTERECTOMY     ABDOMINAL HYSTERECTOMY  01/15/2009   complete   COLONOSCOPY     DILATION AND CURETTAGE OF UTERUS  01/16/1983   EYE SURGERY  03/03/2014   Surgery on both eyes for epiretinal membrane (vitreous peel)   Gated Spect wall motion stress cardiolite  11/05/2001    HIATAL HERNIA REPAIR N/A 07/12/2021   Procedure: LAPAROSCOPY W/ EXTENSIVE FOREGUT DISSECTION; PARTIAL STOMACH REDUCTION; GASTROSTOMY TUBE PLACEMENT; GASTROPEXY;  Surgeon: Gladis Cough, MD;  Location: [facility];  Service: General;  Laterality: N/A;   LAPAROSCOPIC RIGHT HEMI COLECTOMY Right 10/16/2022   Procedure: LAPAROSCOPIC ASSISTED RIGHT HEMI COLECTOMY;  Surgeon: Teresa Lonni HERO, MD;  Location: [facility];  Service: General;  Laterality: Right;   POLYPECTOMY     TUBAL LIGATION  01/15/1993   UTERINE SUSPENSION     mesh   VITRECTOMY Bilateral 03/03/2014   XI ROBOTIC ASSISTED HIATAL HERNIA REPAIR N/A 05/01/2021   Procedure: XI ROBOTIC ASSISTED TYPE III HIATAL HERNIA REPAIR WITH FUNDOPLICATION;  Surgeon: Gladis Cough, MD;  Location: [facility];  Service: General;  Laterality: N/A;   Patient Active Problem List   Diagnosis Date Noted   Dysphagia 11/05/2022   Vestibular dizziness 11/05/2022   Malnutrition of moderate degree 11/01/2022   Acute stroke due to ischemia (HCC) 10/25/2022   CVA (cerebral vascular accident) (HCC) 10/25/2022   Protein-calorie malnutrition, severe 10/19/2022   Diverticulosis 10/14/2022   History of emphysema (HCC) 10/14/2022   SBO (small bowel obstruction) (HCC) 10/13/2022   Aortic valve calcification 09/20/2022   Abdominal aortic  atherosclerosis (HCC) 09/20/2022   Hiatal hernia 07/12/2021   Status post laparoscopic Nissen fundoplication 05/01/2021   IBS (irritable bowel syndrome) 07/27/2016   High-tone pelvic floor dysfunction 05/09/2016   Midline cystocele 05/09/2016   Vaginal atrophy 05/09/2016   Bladder prolapse, female, acquired 05/07/2016   Dystrophic nail 04/29/2016   Fatigue 04/27/2016   Sleep apnea 04/27/2016   UTI (urinary tract infection) 09/11/2015   Cataract cortical, senile, left 12/20/2014   Rectal lesion 10/10/2014   Other type of migraine without status migrainosus 05/06/2014   Dyspepsia 05/06/2014   Postmenopausal estrogen deficiency  05/06/2014   Screening for breast cancer 05/06/2014   Bilateral low-tension glaucoma, indeterminate stage 12/29/2013   High myopia, bilateral 12/29/2013   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   Obesity 11/01/2013   Eustachian tube dysfunction 10/08/2013   Otalgia of left ear 10/08/2013   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   Arthritis 07/24/2013   Anxiety attack 03/08/2013   Paresthesia 12/22/2012   Headache 08/17/2012   BCC (basal cell carcinoma of skin) 06/01/2012   Left arm pain 05/10/2012   Right knee pain 05/10/2012   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   ADD (attention deficit disorder) 04/13/2012   Perimenopausal 01/16/2012   Macular pucker of both eyes 10/18/2011   Vitreous degeneration 10/18/2011   Chronic anemia 10/10/2011   Preventative health care 10/10/2011   Urticaria, chronic 10/10/2011   Macular pucker, bilateral 10/10/2011   Lower back pain    DIVERTICULITIS-COLON 07/25/2009   Diverticulitis of colon 07/25/2009   Hyperlipidemia 06/09/2009   Essential hypertension 06/09/2009   History of esophageal stricture 09/06/2008   GERD (gastroesophageal reflux disease) 07/27/2008   Bipolar disorder (HCC) 01/17/2007   Hypothyroidism 08/24/2006   Allergic rhinitis 08/24/2006   URINARY INCONTINENCE 08/24/2006    ONSET DATE: 10/13/22  REFERRING DIAG: I63.9 (ICD-10-CM) - Brainstem stroke  THERAPY DIAG:  Hemiplegia and hemiparesis following cerebral infarction affecting left non-dominant side (HCC)  Other disturbances of skin sensation  Other lack of coordination  Visuospatial deficit  Muscle weakness (generalized)  Unsteadiness on feet  Rationale for Evaluation and Treatment: Rehabilitation  SUBJECTIVE:   SUBJECTIVE STATEMENT: Pt reports that she was let go from her job 01/14/23 with no short term disability.  Pt reports that she has been experiencing a bunch of emotions, but has been trying to keep doing as much  as she can.  Pt reports that her husband fell and hurt his back.  Pt accompanied by: self and significant other (husband dropped her off)  PERTINENT HISTORY: admitted 9/28 with RUQ pain with SBO. 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 PMhx: HTN, HLD, peptic ulcer disease, diverticulitis, bipolar disorder, hypothyroidism, GERD, Hashimoto's disease.  PRECAUTIONS: Fall  WEIGHT BEARING RESTRICTIONS:  lifting restrictions: not to lift anything > gallon of milk for 2 weeks  PAIN:  Are you having pain? Yes: NPRS scale: 4/10 Pain location: RUE Pain description: needle prick, burning Aggravating factors: squeezing, effort or at the end of the day when lying down Relieving factors: rest  FALLS: Has patient fallen in last 6 months? Yes. Number of falls 1 fall prior to stroke and fell when out feeding the animals  LIVING ENVIRONMENT: Lives with: lives with their spouse and adult grandson with disabilities (dtr comes every other day to assist with shower) Lives in: House/apartment Stairs: Yes: External: 6 steps in front, 10 steps at back steps; on left going up; was to have ramp built  but this has not happened yet and is managing steps with assist from husband Has following equipment at home: Single point cane, Walker - 2 wheeled, Environmental Consultant - 4 wheeled, Wheelchair (manual), shower chair, bed side commode, and Grab bars (next to toilet and 2 in the shower)  PLOF: Independent and Independent with basic ADLs  PATIENT GOALS: I want to get back to doing as much as I can.  OBJECTIVE:  Note: Objective measures were completed at Evaluation unless otherwise noted.  HAND DOMINANCE: Right  ADLs: Transfers/ambulation related to ADLs: w/c in the house or walking with RW when dtr is in the house, short distance walking from house to the car with cane and/or physical assistance from spouse Eating: husband cutting foods, pt utilizing straw for  drinking, and does spill drinks and food intermittently  Grooming: Mod I  UB Dressing: assist for fastening bra, to don shirt LB Dressing: assist for donning socks, has elastic laces for shoes but has been able to tie shoes Toileting: Supervision, spouse will assist intermittently Bathing: Supervision, dtr setup for soap and washing back and hair Tub Shower transfers: hand held assist when stepping in/out of shower Equipment: Shower seat with back, Grab bars, Walk in shower, bed side commode, Reacher, and Long handled sponge  IADLs: Light housekeeping: husband and dtr doing all Meal Prep: husband doing all the cooking Community mobility: not driving Medication management: dtr dispenses meds in pill box and utilizes alarm for reminders  Handwriting:  TBD - pt reports different but unsure if vision vs coordination  MOBILITY STATUS: Needs Assist: Supervision to CGA for mobility with RW, utilizing hand held assist when transferring to/from car and use of w/c in the home and community  POSTURE COMMENTS:  rounded shoulders  ACTIVITY TOLERANCE: Activity tolerance: diminished  FUNCTIONAL OUTCOME MEASURES: FOTO: 64, predicted 73 in 17 visits 02/20/23: 58, pt reports it's difficult to answer as I am so right handed.    UPPER EXTREMITY ROM:    Active ROM Right eval Left eval  Shoulder flexion WFL 160*  Shoulder abduction [facility]   Shoulder adduction    Shoulder extension    Shoulder internal rotation [facility]  Shoulder external rotation [facility]  Elbow flexion [facility] WFL  Elbow extension WFL Slight bend  Wrist flexion    Wrist extension    Wrist ulnar deviation    Wrist radial deviation    Wrist pronation    Wrist supination    (Blank rows = not tested)  UPPER EXTREMITY MMT:   Grossly 4+/5 bilaterally  HAND FUNCTION: 02/20/23: R: 34# and L: 50#  COORDINATION: 9 Hole Peg test: Right: 25.87 sec; Left: 49.78 sec Box and Blocks:  Right 57 blocks, Left 46 blocks 02/20/23: Box and blocks:  Right: 46 blocks and Left: 44 blocks 02/20/23: 9 hole peg test: L: 40.62 sec   SENSATION: Light touch: WFL Pt reports impaired sensation of hot/cold and pinch on RUE and face,   COGNITION: Overall cognitive status: Within functional limits for tasks assessed  VISION: Subjective report: this eye (left) doesn't close.  I can see shadows/change in light Baseline vision: Wears glasses all the time  VISION ASSESSMENT: Eye alignment: Impaired: Left eye lower lid severely droops, upper lid droops; L eye moves with R eye but no vision out of L eye  Ocular ROM: restricted on looking left and restricted on looking up Gaze preference/alignment: WDL Tracking/Visual pursuits: Decreased smoothness with vertical tracking Saccades: additional head turns occurred during testing Convergence: Impaired:  Visual Fields: Left visual field deficits Depth perception: Undershoots  Patient has difficulty with following activities due to following visual impairments: writing, reading  PRAXIS: Impaired: Motor planning; Ataxia on RUE  OBSERVATIONS: Pt well groomed and presented very knowledgeable about current status as well as medical history.  Pt limited in mobility in home due to reporting that her husband and adult grandson are both disabled and can provide intermittent assistance.   TODAY'S TREATMENT:                                                                              DATE:  02/20/23 Measurements taken: see above for 9 hole peg test and Box and blocks assessment.  Pt demonstrating improvements with 9 hole peg test with LUE, however slower bilaterally with box and blocks.  Pt reports onset of pain in RUE with increase movement.  Coordination: w/ each UE as appropriate, including: picking up various small objects/coins and placing into container,  picking up 5-10 coins and translating palm-to-fingertips and then placing coins into coin slot, picking up coins and stacking/unstacking them.  Pt  demonstrating increased difficulty with smaller, flatter items but able to complete with increased time and effort.   Sleeping: educated on positioning for improved sleeping as pt reporting pain in RUE with increased movement as well as reports of pain with sleeping.  Provided pt with handouts to facilitate carryover to home.    01/08/23 Coordination: completed 9hole peg and box and blocks (see above for measurements).  Pt with decreased motor control and coordination on L > R.  Pt reports she had been improving on with LUE coordination, however feels that things are more impacted since eye surgery last week.   Memory: pt reports having calendar of appts printed out and has shared it with her daughter to facilitate increased carryover of attendance to appts. Pt reports having [employer] to assist in recall of appointments and to set reminders, however it does not seem to recognize her voice.   Handwriting: pt with good legibility this session when writing 2-3 sentences. Therapeutic activity: engaged in large grip peg pattern replication in standing with focus on coordination and standing balance.  OT providing CGA for standing balance and cues for sequencing with pattern.  Pt demonstrating difficulty with diagonal pattern requiring cues to count spaces to allow for sequencing and spacing.  Pt with improved control with large grip pegs compared to smaller pegs during 9 hole peg test.   12/31/22 NA, eval only   PATIENT EDUCATION: Education details: educated on sleeping positions and coordination HEP. Person educated: Patient Education method: Explanation Education comprehension: verbalized understanding and needs further education  HOME EXERCISE PROGRAM: TBD   GOALS: Goals reviewed with patient? Yes  SHORT TERM GOALS: Target date: 02/01/23  Pt will be Mod I with initial neuromuscular re-education HEP for R UE. Baseline: Goal status: IN PROGRESS  2.  Pt will be able to don/doff socks  Mod I with AE PRN. Baseline:  Goal status: MET - pt reports tighter socks are still a challenge but that she is doing it without assistance 02/20/23  3.  Pt will be able to don/doff jacket with Supervision/setup utilizing adaptive strategies PRN.  Baseline:  02/20/23: pt's husband still assisting with jacket, especially L side due to visual impairment Goal status: IN PROGRESS  4.  Pt will verbalize understanding of compensatory strategies to accommodate for decreased vision in L visual field. Baseline:  02/20/23: pt reports still having difficulty, have not been able to truly address as pt has had large gap between visits  Goal status: IN PROGRESS  5.  Pt will verbalize understanding of energy conservation strategies and report 2 strategies being utilized during ADLs/IADLs to increase safety and independence. Baseline:  02/20/23: have not been able to fully address due to large gap between visits Goal status: IN PROGRESS  NEW SHORT TERM GOALS: Target date: 03/15/23  Pt will be Mod I with initial neuromuscular re-education HEP for R UE. Baseline: Goal status: IN PROGRESS  2.  Pt will be able to don/doff jacket with Supervision/setup utilizing adaptive strategies PRN. Baseline:  02/20/23: pt's husband still assisting with jacket, especially L side due to visual impairment Goal status: IN PROGRESS  3.  Pt will verbalize understanding of compensatory strategies to accommodate for decreased vision in L visual field. Baseline:  02/20/23: pt reports still having difficulty, have not been able to truly address as pt has had large gap between visits  Goal status: IN PROGRESS  4.  Pt will verbalize understanding of energy conservation strategies and report 2 strategies being utilized during ADLs/IADLs to increase safety and independence. Baseline:  02/20/23: have not been able to fully address due to large gap between visits Goal status: IN PROGRESS   LONG TERM GOALS: Target date: 03/01/23  Pt will  demonstrate improved fine motor coordination for ADLs (clothing fasteners) as evidenced by decreasing 9 hole peg test score for LUE to < 35 seconds. Baseline: L: 49.78 sec on 01/08/23 02/20/23 - L: 40.62 sec  Goal status: IN PROGRESS  2.  Pt will demonstrate improved motor control to safely retrieve a moderate weight object at from shelf at mod-high range.  Baseline:  Goal status: IN PROGRESS  3.  Pt will demonstrate improved grip strength as needed to open new/tight food containers. Baseline:  Goal status: IN PROGRESS  4.  Pt will demonstrate improved coordination and motor control with LUE during bimanual tasks requiring increased weight and/or force. Baseline:  Goal status: IN PROGRESS  5.  Pt will demonstrate improved motor control in dominant UE as evidenced by improved legibility with handwriting. Baseline:  Goal status: MET - pt with 100% legibility on 01/08/23  LONG TERM GOALS: Target date: 04/05/23  Pt will demonstrate improved fine motor coordination for ADLs (clothing fasteners) as evidenced by decreasing 9 hole peg test score for LUE to < 35 seconds. Baseline: L: 49.78 sec on 01/08/23 02/20/23 - L: 40.62 sec  Goal status: IN PROGRESS  2.  Pt will demonstrate improved motor control to safely retrieve a moderate weight object at from shelf at mod-high range.  Baseline:  Goal status: IN PROGRESS  3.  Pt will demonstrate improved grip strength as needed to open new/tight food containers. Baseline:  Goal status: IN PROGRESS  4.  Pt will demonstrate improved coordination and motor control with LUE during bimanual tasks requiring increased weight and/or force. Baseline:  Goal status: IN PROGRESS   ASSESSMENT:  CLINICAL IMPRESSION: Pt returning to therapy after extensive break due to losing her job and her insurance coverage.  Therefore engaged in re-cert with focus on reviewing goals and progress towards goals as well as initiating coordination HEP and education on  sleeping positions.  Pt demonstrating some improvements in coordination with LUE, however slowing with RUE as well as reports of increase in pain in RUE.  Pt demonstrating good understanding and attention to coordination tasks with good understanding of modifications and use of larger items to facilitate increased ease and progress to smaller/flatter items.  Pt will benefit from continued skilled occupational therapy services to address strength and coordination, ROM, pain management, altered sensation, balance, GM/FM control, safety awareness, introduction of compensatory strategies/AE prn, visual-perception, and implementation of an HEP to improve participation and safety during ADLs and IADLs.     PERFORMANCE DEFICITS: in functional skills including ADLs, IADLs, coordination, sensation, ROM, strength, pain, Fine motor control, Gross motor control, mobility, balance, body mechanics, endurance, decreased knowledge of precautions, decreased knowledge of use of DME, vision, and UE functional use and psychosocial skills including coping strategies, environmental adaptation, and routines and behaviors.     PLAN:  OT FREQUENCY: 2x/week  OT DURATION: 6 weeks  PLANNED INTERVENTIONS: 97168 OT Re-evaluation, 97535 self care/ADL training, 02889 therapeutic exercise, 97530 therapeutic activity, 97112 neuromuscular re-education, 97035 ultrasound, 97010 moist heat, 97010 cryotherapy, 97760 Orthotics management and training, balance training, functional mobility training, compression bandaging, visual/perceptual remediation/compensation, psychosocial skills training, energy conservation, coping strategies training, patient/family education, and DME and/or AE instructions  RECOMMENDED OTHER SERVICES: NA  CONSULTED AND AGREED WITH PLAN OF CARE: Patient  PLAN FOR NEXT SESSION: continue coordination, reaching, standing activities; educate on LB dressing strategies; educate on visual and sensation compensatory  strategies   KAYLENE DOMINO, OTR/L 02/20/2023, 2:02 PM   [facility] [address], [address] [place], [place] [address] Phone # [phone] Fax # [phone]

## 2023-02-20 NOTE — Patient Instructions (Addendum)
   When you eat: Check for remaining food in your mouth, intermittently   Sweep your tongue, press your cheek Take a sip of liquid after every bite of food Take one or two dry swallows for each bite/sip  SWALLOWING EXERCISES Do these for 8 weeks, then 2 days per week afterwards You can use 1-2 drops of liquid to help you swallow, if your mouth gets dry  Effortful Swallows - Press your tongue against the roof of your mouth for 3 seconds, then swallow as hard as you can - Do at least 20 reps/day, in sets of 5-10  Masako Swallow - swallow with your tongue sticking out - Stick tongue out past your lips and gently bite tongue with your teeth - Swallow, keeping your tongue out - Do at least 20 reps/day, in sets of 5-10   3. Tongue Stretch - Move your tongue around the pocket between your gums and teeth 10 times clockwise, and then 10 times  counter-clockwise - Repeat on the back side, 10 times clockwise and then 10 times counter-clockwise - Repeat twice a day  4.  Tongue in both cheeks - put your tongue in your cheeks and push for 5 seconds - Do at least 20 reps/day, in sets of 5-10

## 2023-02-20 NOTE — Patient Instructions (Addendum)
 Joan Robinson

## 2023-02-25 ENCOUNTER — Encounter: Payer: BC Managed Care – PPO | Admitting: Occupational Therapy

## 2023-02-25 ENCOUNTER — Ambulatory Visit: Payer: BC Managed Care – PPO

## 2023-02-25 ENCOUNTER — Encounter: Payer: BC Managed Care – PPO | Admitting: Speech Pathology

## 2023-02-25 NOTE — Telephone Encounter (Signed)
 Eilleen Grates, MD  You4 days ago    I did not see any significant rhythm issues.

## 2023-02-25 NOTE — Telephone Encounter (Signed)
 Message sent to patient via MyChart.

## 2023-02-27 ENCOUNTER — Ambulatory Visit: Payer: BC Managed Care – PPO

## 2023-02-27 ENCOUNTER — Encounter: Payer: BC Managed Care – PPO | Admitting: Occupational Therapy

## 2023-02-28 ENCOUNTER — Ambulatory Visit: Payer: Commercial Managed Care - HMO

## 2023-02-28 ENCOUNTER — Ambulatory Visit: Payer: Commercial Managed Care - HMO | Admitting: Occupational Therapy

## 2023-02-28 DIAGNOSIS — R208 Other disturbances of skin sensation: Secondary | ICD-10-CM

## 2023-02-28 DIAGNOSIS — R2689 Other abnormalities of gait and mobility: Secondary | ICD-10-CM

## 2023-02-28 DIAGNOSIS — R278 Other lack of coordination: Secondary | ICD-10-CM

## 2023-02-28 DIAGNOSIS — M6281 Muscle weakness (generalized): Secondary | ICD-10-CM

## 2023-02-28 DIAGNOSIS — R2681 Unsteadiness on feet: Secondary | ICD-10-CM

## 2023-02-28 DIAGNOSIS — I69354 Hemiplegia and hemiparesis following cerebral infarction affecting left non-dominant side: Secondary | ICD-10-CM

## 2023-02-28 DIAGNOSIS — R1312 Dysphagia, oropharyngeal phase: Secondary | ICD-10-CM | POA: Diagnosis not present

## 2023-02-28 NOTE — Therapy (Signed)
OUTPATIENT PHYSICAL THERAPY NEURO TREATMENT   Patient Name: Joan Robinson MRN: [id number] DOB:[birth year], 63 y.o., female Today's Date: 02/28/2023   PCP: April Manson, NP REFERRING PROVIDER: Micki Riley, MD   END OF SESSION:  PT End of Session - 02/28/23 1024     Visit Number 4    Number of Visits 9    Date for PT Re-Evaluation 04/03/23    Authorization Type [employer]    Authorization - Visit Number 2    Authorization - Number of Visits 10   30 combined   PT Start Time 1023   late arrival   PT Stop Time 1100    PT Time Calculation (min) 37 min    Equipment Utilized During Treatment Gait belt    Activity Tolerance Patient tolerated treatment well    Behavior During Therapy WFL for tasks assessed/performed               Past Medical History:  Diagnosis Date   Allergic rhinitis    Anemia 10/10/2011   Anxiety and depression 01/17/2007   Qualifier: Diagnosis of  By: Everardo All MD, Sean A    Arthritis 07/24/2013   Likely inflammatory and following with Dr Maryln Gottron of [facility]  rheumatology   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   BCC (basal cell carcinoma of skin) 06/01/2012   Leg Follows with Dr Margo Aye   Bipolar disorder (HCC) 01/17/2007   Qualifier: Diagnosis of  By: Everardo All MD, Sean A    Cataract    Diverticulosis    Diverticulosis    Emphysema of lung (HCC)    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   Gallstones    GERD (gastroesophageal reflux disease)    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   Hashimoto's disease    Hyperlipidemia    Hypertension    Hypothyroidism 08/24/2006   Qualifier: Diagnosis of  By: Everardo All MD, Sean A     IBS (irritable bowel syndrome) 07/27/2016   Obesity 11/01/2013   PUD (peptic ulcer disease)    Sleep apnea 04/27/2016   Tobacco abuse    Past Surgical History:  Procedure Laterality Date   ABDOMINAL HYSTERECTOMY     ABDOMINAL HYSTERECTOMY  01/15/2009   complete   COLONOSCOPY     DILATION AND CURETTAGE OF UTERUS  01/16/1983   EYE SURGERY   03/03/2014   Surgery on both eyes for epiretinal membrane (vitreous peel)   Gated Spect wall motion stress cardiolite  11/05/2001   HIATAL HERNIA REPAIR N/A 07/12/2021   Procedure: LAPAROSCOPY W/ EXTENSIVE FOREGUT DISSECTION; PARTIAL STOMACH REDUCTION; GASTROSTOMY TUBE PLACEMENT; GASTROPEXY;  Surgeon: Luretha Murphy, MD;  Location: [facility];  Service: General;  Laterality: N/A;   LAPAROSCOPIC RIGHT HEMI COLECTOMY Right 10/16/2022   Procedure: LAPAROSCOPIC ASSISTED RIGHT HEMI COLECTOMY;  Surgeon: Andria Meuse, MD;  Location: [facility];  Service: General;  Laterality: Right;   POLYPECTOMY     TUBAL LIGATION  01/15/1993   UTERINE SUSPENSION     mesh   VITRECTOMY Bilateral 03/03/2014   XI ROBOTIC ASSISTED HIATAL HERNIA REPAIR N/A 05/01/2021   Procedure: XI ROBOTIC ASSISTED TYPE III HIATAL HERNIA REPAIR WITH FUNDOPLICATION;  Surgeon: Luretha Murphy, MD;  Location: [facility];  Service: General;  Laterality: N/A;   Patient Active Problem List   Diagnosis Date Noted   Dysphagia 11/05/2022   Vestibular dizziness 11/05/2022   Malnutrition of moderate degree 11/01/2022   Acute stroke due to ischemia [facility]) 10/25/2022   CVA (cerebral vascular accident) (HCC) 10/25/2022   Protein-calorie malnutrition, severe 10/19/2022  Diverticulosis 10/14/2022   History of emphysema (HCC) 10/14/2022   SBO (small bowel obstruction) (HCC) 10/13/2022   Aortic valve calcification 09/20/2022   Abdominal aortic atherosclerosis (HCC) 09/20/2022   Hiatal hernia 07/12/2021   Status post laparoscopic Nissen fundoplication 05/01/2021   IBS (irritable bowel syndrome) 07/27/2016   High-tone pelvic floor dysfunction 05/09/2016   Midline cystocele 05/09/2016   Vaginal atrophy 05/09/2016   Bladder prolapse, female, acquired 05/07/2016   Dystrophic nail 04/29/2016   Fatigue 04/27/2016   Sleep apnea 04/27/2016   UTI (urinary tract infection) 09/11/2015   Cataract cortical, senile, left 12/20/2014   Rectal lesion 10/10/2014    Other type of migraine without status migrainosus 05/06/2014   Dyspepsia 05/06/2014   Postmenopausal estrogen deficiency 05/06/2014   Screening for breast cancer 05/06/2014   Bilateral low-tension glaucoma, indeterminate stage 12/29/2013   High myopia, bilateral 12/29/2013   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   Obesity 11/01/2013   Eustachian tube dysfunction 10/08/2013   Otalgia of left ear 10/08/2013   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   Arthritis 07/24/2013   Anxiety attack 03/08/2013   Paresthesia 12/22/2012   Headache 08/17/2012   BCC (basal cell carcinoma of skin) 06/01/2012   Left arm pain 05/10/2012   Right knee pain 05/10/2012   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   ADD (attention deficit disorder) 04/13/2012   Perimenopausal 01/16/2012   Macular pucker of both eyes 10/18/2011   Vitreous degeneration 10/18/2011   Chronic anemia 10/10/2011   Preventative health care 10/10/2011   Urticaria, chronic 10/10/2011   Macular pucker, bilateral 10/10/2011   Lower back pain    DIVERTICULITIS-COLON 07/25/2009   Diverticulitis of colon 07/25/2009   Hyperlipidemia 06/09/2009   Essential hypertension 06/09/2009   History of esophageal stricture 09/06/2008   GERD (gastroesophageal reflux disease) 07/27/2008   Bipolar disorder (HCC) 01/17/2007   Hypothyroidism 08/24/2006   Allergic rhinitis 08/24/2006   URINARY INCONTINENCE 08/24/2006    ONSET DATE: 10/13/2022  REFERRING DIAG: I63.9 (ICD-10-CM) - Brainstem stroke (HCC)   THERAPY DIAG:  Unsteadiness on feet  Other abnormalities of gait and mobility  Muscle weakness (generalized)  Hemiplegia and hemiparesis following cerebral infarction affecting left non-dominant side (HCC)  Rationale for Evaluation and Treatment: Rehabilitation  SUBJECTIVE:                                                                                                                                                                                              SUBJECTIVE STATEMENT: Hurt the low back due to being more active at home and lifting boxes.  Noting worsening of right buttocks  pain and radiating leg pain more in the PM.  Denies any symptoms consistent with cauda equina. At present pain in right side of low back, right buttocks  Pt accompanied by: self  PERTINENT HISTORY: Per Dr. Pearlean Brownie office note 12/11/2022:  PMH of esophageal strictures status post dilation, multiple hernias status post multiple surgeries, GERD/PUD, hypertension, hyperlipidemia, autoimmune urticaria, emphysema, sleep apnea who presents to the ED on 10/13/2022 with right upper quadrant and right lower quadrant abdominal pain x 2 days associated nausea.  She was found to have small bowel obstruction and was being managed inpatient at [facility].  Around shift change, patient was noted to have left facial droop, slurring of her speech and upon further questioning, symptoms were first noted at 1700 on 10/14/2022.  Her team had difficulty establishing a last known well.  However, symptoms were first noted at 1700.  A code stroke was activated if she had a CT head without contrast which was notable for a moderately large left PICA territory infarct.  She was eval by teleneurology.  She was not given TNKase due to unclear last known well and the fact that the stroke looks completed.  CT angio head and neck with and without contrast was negative for any large vessel occlusion.  Specifically, no basilar occlusion or thrombosis noted.  CT perfusion with no core or mismatch noted.  Given the moderately large posterior fossa stroke which is in proximity to brainstem, and the extension of the stroke into the brainstem, she was transferred to [facility] medical ICU for immediate in person neurology evaluation and for close monitoring for signs of posterior fossa crowding and potential herniation.  She was monitored carefully in the ICU and  remained stable and did not require any neurosurgical intervention.  MRI showed a large acute infarct involving both left PICA and Icar territory with mass effect on posterior fossa and fourth ventricle mild hydrocephalus.  2D echo showed ejection fraction of 60 to 65%.  Left atrial size was normal.  LDL cholesterol 54 mg percent.  Hemoglobin A1c was 5.6.  Patient had significant left peripheral facial nerve weakness as well as dysphagia and left hemiataxia.  She was seen by physical Occupational Therapy transfer to inpatient rehab where she stayed for a month.  She made gradual improvement in his been home now for nearly 4 weeks.  She still has significant left facial weakness with dryness of the left eye with redness.  She is unable to close her left eye.  She does have an upcoming appointment to see ophthalmologist next week.  She is still not gotten any hearing back in the left ear.  She still has trouble swallowing has to swallow mostly on the right side.  She has a lot of drooling from the left corner of the mouth.  She still has some left-sided incoordination and she is working with therapist has shown some improvement.  She is still unable to walk on her own and requires 1 person assist with gait belt to walk with a wheelchair.  She is tolerating Plavix with minor bruising but no bleeding.  Patient is clearly significantly disabled and is currently on short-term disability and thinking about going on a long disability.  Patient also has a new complaint of neck pain and right arm numbness.  Her primary care physician has ordered an MRI of the C-spine is scheduled for next week for this.  Last CT head on 10/28/2022 had shown decrease mass effect in the posterior  fossa with improved patency of the fourth ventricle.    PAIN:  Are you having pain?  6-7/10 right low back/buttocks  PRECAUTIONS: Fall and Other: vision changes    Had L eye surgery and sees surgeon tomorrow. Cornea specialist next week. Reports  double vision if she uncovers the L eye. 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 Pt reports no lifting >gallon of milk for 2 weeks(pt reports this has been lifted)  RED FLAGS: None   WEIGHT BEARING RESTRICTIONS: No  FALLS: Has patient fallen in last 6 months? No  LIVING ENVIRONMENT: Lives with: lives with their family and daughter comes over to help as husband can't be primary caregiver Lives in: House/apartment Stairs:  6 steps to front of home with L rail ascending Has following equipment at home: Single point cane, Environmental consultant - 2 wheeled, Environmental consultant - 4 wheeled, and Wheelchair (manual)  PLOF: Independent; enjoyed gardening, office work; took care of family  PATIENT GOALS: Pt's goal is to get back to being better to help with my family.  OBJECTIVE:   TODAY'S TREATMENT: 02/28/23 Activity Comments  No focal weakness to RLE via resisted (myotome) tests   Supported prone Tenderness to sciatic notch, discomfort with passive knee flexion (more stretch to quads from her description)--no centralization of symptoms noted  Sciatic nerve glide (supine)   Posterior pelvic tilt 2x10 Cues for form/sequence  Single knee to chest 10 deep breaths  4-5/10 back/leg pain   Balance/pivot turns Cues/guarding to facilitate large amplitude step vs small steps in order to improve coordination and single limb support      TODAY'S TREATMENT: 02/20/23 Activity Comments  TUG 23.42 sec with 4WW  5xSTS 12.54 sec without UEs and decreased eccentric control   Berg 45/56  49M walk 21.44 sec with 4WW (1.53 ft/sec )               PATIENT EDUCATION: Education details: edu on exam findings and progress/remaining deficits towards goals, answered pt's questions about safety with [phone] and how to increase walking endurance; advised to try walking for time inside with 4WW first to test endurance before trying to walk outside  Person educated: Patient Education method:  Explanation Education comprehension: verbalized understanding     HOME EXERCISE PROGRAM Last updated: 01/08/23 Access Code: [email or web address] URL: [email or web address] Date: 01/08/2023 Prepared by: [facility] - Outpatient  Rehab - [facility]  Exercises - Sit to Stand Without Arm Support  - 1 x daily - 5 x weekly - 2 sets - 10 reps - Heel Toe Raises with Counter Support  - 1 x daily - 5 x weekly - 2 sets - 10 reps - Alternating Step Forward with Support  - 1 x daily - 5 x weekly - 2 sets - 10 reps - Sitting Knee Extension with Resistance  - 1 x daily - 5 x weekly - 2 sets - 10 reps - Seated Hamstring Curl with Anchored Resistance  - 1 x daily - 5 x weekly - 2 sets - 10 reps - Supine Sciatic Nerve Glide  - 1 x daily - 7 x weekly - 3 sets - 10 reps - Supine Posterior Pelvic Tilt  - 1 x daily - 7 x weekly - 3 sets - 10 reps    --------------------------------------------------------------- Note: Objective measures below were completed at Evaluation unless otherwise noted.  DIAGNOSTIC FINDINGS: See above from CVA; MRI upcoming for RUE numbness and cervical pain  COGNITION: Overall cognitive  status: Within functional limits for tasks assessed   SENSATION: Light touch: WFL except for R hand not being able to discern hot/cold  COORDINATION: Slowed alt toe taps, slowed heel to shin (R more than L)  MUSCLE TONE: LLE: Mild  POSTURE: rounded shoulders and forward head Sits in posterior pelvic tilt  LOWER EXTREMITY ROM:   AROM WFL BLEs  LOWER EXTREMITY MMT:    MMT Right Eval Left Eval  Hip flexion 4 4+  Hip extension    Hip abduction 4+ 4+  Hip adduction 4+ 4+  Hip internal rotation    Hip external rotation    Knee flexion 4 4+  Knee extension 4 4+  Ankle dorsiflexion 4+ 4+  Ankle plantarflexion    Ankle inversion 4 4  Ankle eversion 4 4  (Blank rows = not tested)   TRANSFERS: Assistive device utilized: None  Sit to stand: SBA Stand to sit:  SBA  GAIT: Gait pattern: step to pattern, step through pattern, decreased step length- Right, decreased step length- Left, and narrow BOS Distance walked: 35 ft x 2 Assistive device utilized: Walker - 2 wheeled Level of assistance: CGA and Min A Comments: 38.54 sec 20 ft (0.52 ft/sec)  FUNCTIONAL TESTS:  5 times sit to stand: 27.75 sec with Ues; performs sit<>stand x 1 with arms crossed at chest Timed up and go (TUG): 45.94 sec with RW  PATIENT SURVEYS:  FOTO 56; predicted 67  TODAY'S TREATMENT:                                                                                                                              DATE: 12/31/2022    PATIENT EDUCATION: Education details:  Eval results, POC Person educated: Patient Education method: Explanation Education comprehension: verbalized understanding  HOME EXERCISE PROGRAM: Not yet initiated  GOALS: Goals reviewed with patient? Yes  SHORT TERM GOALS: Target date: 02/01/2023  Pt will be independent with HEP for improved strength, balance, functional mobility. Baseline: NT 02/20/23 Goal status: IN PROGRESS  2.  Pt will improve 5x sit<>stand to less than or equal to 20 sec to demonstrate improved functional strength and transfer efficiency. Baseline: 27.75 sec with BUE support; 12.54 sec without UEs and decreased eccentric control 02/20/23 Goal status: MET 02/20/23  3.  Pt will improve TUG score to less than or equal to 35 sec for decreased fall risk. Baseline: 23.42 sec 02/20/23 Goal status: MET 02/20/23  4.  Berg score to improve by 7 points from baseline, for decreased fall risk. Baseline: 37>45 02/20/23 Goal status:MET 02/20/23  5.  Pt will ambulate at least 500 ft outdoor community surfaces, with RW, supervision.  Baseline:  RW with CGA/min; NT 02/20/23  Goals status: IN PROGRESS     LONG TERM GOALS: Target date: 04/03/2023  Pt will be independent with HEP for improved strength, balance, gait. Baseline:  NT 02/20/23 Goal status:  IN PROGRESS  2.  Pt will improve 5x sit<>stand  to less than or equal to 12.5 sec to demonstrate improved functional strength and transfer efficiency. Baseline: 12.5 sec 02/20/23 Goal status:MET 02/20/23  3.  Pt will improve TUG score to less than or equal to 20 sec for decreased fall risk. Baseline: 23.42 sec 02/20/23 Goal status: IN PROGRESS 02/20/23  4.  Pt will improve gait velocity to at least 1.3 ft/sec for improved gait efficiency and safety.  Baseline: 1.53 ft/sec 02/20/23 Goal status:MET 02/20/23  5.  Pt will ambulate 200 ft, household and short community distances, mod I, with least restrictive assistive device (cane vs. Walker) Baseline:  NT 02/20/23 Goal status: IN PROGRESS  6.  FOTO score to improve to 67 to demo improved overall functional mobility.  Baseline:  56 NT 02/20/23  Goal status:  IN PROGRESS  7. Patient to score at least 50/56 on Berg in order to decrease falls risk.  Baseline:  45 02/20/23  Goal status:  INITIAL   7. Patient to report ability to access local community such as church and grocery store with LRAD.  Baseline:  reports using w/c outside of house 02/20/23  Goal status:  INITIAL  ASSESSMENT:  CLINICAL IMPRESSION: Returns to clinic and reports worse back and right buttock pain from being more active in household. Radiating RLE pain not relieaved with prone, tenderness to sciatic notch. Instructed in nerve glide and pelvic tilts for mobility and core bracing to implement to functional lifts to reduce risk for back injury/pain.  Reports some relief following this movements.  Engaged in multisensory balance activities to improve proprioception and postural stability with improved performance, but needing wider BOS.  Trials of large amplitude pivot turns to improve safety with activities such as kitchen duties for improving repeated turns in small spaces requiring CGA for stability.  Continued sessions to progress POC details to improve mobility and reduce risk for falls.    OBJECTIVE IMPAIRMENTS: Abnormal gait, decreased balance, decreased mobility, difficulty walking, decreased strength, impaired tone, impaired vision/preception, and postural dysfunction.   ACTIVITY LIMITATIONS: carrying, lifting, standing, squatting, stairs, transfers, bathing, toileting, dressing, reach over head, hygiene/grooming, locomotion level, and caring for others  PARTICIPATION LIMITATIONS: meal prep, cleaning, laundry, interpersonal relationship, driving, shopping, community activity, occupation, and church  PERSONAL FACTORS: 3+ comorbidities: See PMH above  are also affecting patient's functional outcome.   REHAB POTENTIAL: Good  CLINICAL DECISION MAKING: Evolving/moderate complexity  EVALUATION COMPLEXITY: Moderate  PLAN:  PT FREQUENCY: 1x/week for 6 weeks  PT DURATION: other: -  PLANNED INTERVENTIONS: 97110-Therapeutic exercises, 97530- Therapeutic activity, [id number]- Neuromuscular re-education, 97535- Self Care, [id number]- Manual therapy, [phone]- Gait training, [phone]- Orthotic Fit/training, [phone]- Aquatic Therapy, Patient/Family education, Balance training, Stair training, DME instructions, and Wheelchair mobility training  PLAN FOR NEXT SESSION: review and update HEP to reflect current abilities, progress narrow BOS balance, balance with turns, trial cane? Work on walking endurance and improving balance confidence     11:43 AM, 02/28/23 M. Shary Decamp, PT, DPT Physical Therapist- [place] Office Number: [phone]

## 2023-02-28 NOTE — Therapy (Signed)
OUTPATIENT OCCUPATIONAL THERAPY NEURO  Treatment Note   Patient Name: Joan Robinson MRN: [id number] DOB:[birth year], 63 y.o., female Today's Date: 02/28/2023  PCP: April Manson, NP REFERRING PROVIDER: Micki Riley, MD  END OF SESSION:  OT End of Session - 02/28/23 1114     Visit Number 4    Number of Visits 17    Date for OT Re-Evaluation 04/05/23    Authorization Type [employer] [id number]    Authorization Time Period 30 visits combined OT/PT/ST    OT Start Time 1101    OT Stop Time 1141    OT Time Calculation (min) 40 min    Behavior During Therapy [facility] for tasks assessed/performed                Past Medical History:  Diagnosis Date   Allergic rhinitis    Anemia 10/10/2011   Anxiety and depression 01/17/2007   Qualifier: Diagnosis of  By: Everardo All MD, Sean A    Arthritis 07/24/2013   Likely inflammatory and following with Dr Maryln Gottron of [facility]  rheumatology   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   BCC (basal cell carcinoma of skin) 06/01/2012   Leg Follows with Dr Margo Aye   Bipolar disorder (HCC) 01/17/2007   Qualifier: Diagnosis of  By: Everardo All MD, Sean A    Cataract    Diverticulosis    Diverticulosis    Emphysema of lung (HCC)    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   Gallstones    GERD (gastroesophageal reflux disease)    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   Hashimoto's disease    Hyperlipidemia    Hypertension    Hypothyroidism 08/24/2006   Qualifier: Diagnosis of  By: Everardo All MD, Sean A     IBS (irritable bowel syndrome) 07/27/2016   Obesity 11/01/2013   PUD (peptic ulcer disease)    Sleep apnea 04/27/2016   Tobacco abuse    Past Surgical History:  Procedure Laterality Date   ABDOMINAL HYSTERECTOMY     ABDOMINAL HYSTERECTOMY  01/15/2009   complete   COLONOSCOPY     DILATION AND CURETTAGE OF UTERUS  01/16/1983   EYE SURGERY  03/03/2014   Surgery on both eyes for epiretinal membrane (vitreous peel)   Gated Spect wall motion stress cardiolite  11/05/2001    HIATAL HERNIA REPAIR N/A 07/12/2021   Procedure: LAPAROSCOPY W/ EXTENSIVE FOREGUT DISSECTION; PARTIAL STOMACH REDUCTION; GASTROSTOMY TUBE PLACEMENT; GASTROPEXY;  Surgeon: Luretha Murphy, MD;  Location: [facility];  Service: General;  Laterality: N/A;   LAPAROSCOPIC RIGHT HEMI COLECTOMY Right 10/16/2022   Procedure: LAPAROSCOPIC ASSISTED RIGHT HEMI COLECTOMY;  Surgeon: Andria Meuse, MD;  Location: [facility] OR;  Service: General;  Laterality: Right;   POLYPECTOMY     TUBAL LIGATION  01/15/1993   UTERINE SUSPENSION     mesh   VITRECTOMY Bilateral 03/03/2014   XI ROBOTIC ASSISTED HIATAL HERNIA REPAIR N/A 05/01/2021   Procedure: XI ROBOTIC ASSISTED TYPE III HIATAL HERNIA REPAIR WITH FUNDOPLICATION;  Surgeon: Luretha Murphy, MD;  Location: [facility];  Service: General;  Laterality: N/A;   Patient Active Problem List   Diagnosis Date Noted   Dysphagia 11/05/2022   Vestibular dizziness 11/05/2022   Malnutrition of moderate degree 11/01/2022   Acute stroke due to ischemia (HCC) 10/25/2022   CVA (cerebral vascular accident) (HCC) 10/25/2022   Protein-calorie malnutrition, severe 10/19/2022   Diverticulosis 10/14/2022   History of emphysema (HCC) 10/14/2022   SBO (small bowel obstruction) (HCC) 10/13/2022   Aortic valve calcification 09/20/2022   Abdominal aortic  atherosclerosis (HCC) 09/20/2022   Hiatal hernia 07/12/2021   Status post laparoscopic Nissen fundoplication 05/01/2021   IBS (irritable bowel syndrome) 07/27/2016   High-tone pelvic floor dysfunction 05/09/2016   Midline cystocele 05/09/2016   Vaginal atrophy 05/09/2016   Bladder prolapse, female, acquired 05/07/2016   Dystrophic nail 04/29/2016   Fatigue 04/27/2016   Sleep apnea 04/27/2016   UTI (urinary tract infection) 09/11/2015   Cataract cortical, senile, left 12/20/2014   Rectal lesion 10/10/2014   Other type of migraine without status migrainosus 05/06/2014   Dyspepsia 05/06/2014   Postmenopausal estrogen deficiency  05/06/2014   Screening for breast cancer 05/06/2014   Bilateral low-tension glaucoma, indeterminate stage 12/29/2013   High myopia, bilateral 12/29/2013   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   Obesity 11/01/2013   Eustachian tube dysfunction 10/08/2013   Otalgia of left ear 10/08/2013   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   Arthritis 07/24/2013   Anxiety attack 03/08/2013   Paresthesia 12/22/2012   Headache 08/17/2012   BCC (basal cell carcinoma of skin) 06/01/2012   Left arm pain 05/10/2012   Right knee pain 05/10/2012   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   ADD (attention deficit disorder) 04/13/2012   Perimenopausal 01/16/2012   Macular pucker of both eyes 10/18/2011   Vitreous degeneration 10/18/2011   Chronic anemia 10/10/2011   Preventative health care 10/10/2011   Urticaria, chronic 10/10/2011   Macular pucker, bilateral 10/10/2011   Lower back pain    DIVERTICULITIS-COLON 07/25/2009   Diverticulitis of colon 07/25/2009   Hyperlipidemia 06/09/2009   Essential hypertension 06/09/2009   History of esophageal stricture 09/06/2008   GERD (gastroesophageal reflux disease) 07/27/2008   Bipolar disorder (HCC) 01/17/2007   Hypothyroidism 08/24/2006   Allergic rhinitis 08/24/2006   URINARY INCONTINENCE 08/24/2006    ONSET DATE: 10/13/22  REFERRING DIAG: I63.9 (ICD-10-CM) - Brainstem stroke  THERAPY DIAG:  Muscle weakness (generalized)  Hemiplegia and hemiparesis following cerebral infarction affecting left non-dominant side (HCC)  Other disturbances of skin sensation  Other lack of coordination  Rationale for Evaluation and Treatment: Rehabilitation  SUBJECTIVE:   SUBJECTIVE STATEMENT: Pt reports hurting her back recently.  Pt reports that she did not have any specific injury but reports increased attempts at doing more for herself as her husband has recently hurt his back and she has "free time" to work on household  tasks.    Pt reports R side of body feels colder, "like ice cubes" are being put on it.  Pt accompanied by: self and significant other (husband dropped her off)  PERTINENT HISTORY: admitted 9/28 with RUQ pain with SBO. 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 PMhx: HTN, HLD, peptic ulcer disease, diverticulitis, bipolar disorder, hypothyroidism, GERD, Hashimoto's disease.  PRECAUTIONS: Fall  WEIGHT BEARING RESTRICTIONS:  lifting restrictions: not to lift anything > gallon of milk for 2 weeks  PAIN:  Are you having pain? Yes: NPRS scale: 5/10 Pain location: lower back Pain description: constant throbbing, pulling sensation Aggravating factors: bending, lifting Relieving factors: rest  FALLS: Has patient fallen in last 6 months? Yes. Number of falls 1 fall prior to stroke and fell when out feeding the animals  LIVING ENVIRONMENT: Lives with: lives with their spouse and adult grandson with disabilities (dtr comes every other day to assist with shower) Lives in: House/apartment Stairs: Yes: External: 6 steps in front, 10 steps at back steps; on left going up; was to have ramp built but this has  not happened yet and is managing steps with assist from husband Has following equipment at home: Single point cane, Walker - 2 wheeled, Environmental consultant - 4 wheeled, Wheelchair (manual), shower chair, bed side commode, and Grab bars (next to toilet and 2 in the shower)  PLOF: Independent and Independent with basic ADLs  PATIENT GOALS: I want to get back to doing as much as I can.  OBJECTIVE:  Note: Objective measures were completed at Evaluation unless otherwise noted.  HAND DOMINANCE: Right  ADLs: Transfers/ambulation related to ADLs: w/c in the house or walking with RW when dtr is in the house, short distance walking from house to the car with cane and/or physical assistance from spouse Eating: husband cutting foods, pt utilizing straw  for drinking, and does spill drinks and food intermittently  Grooming: Mod I  UB Dressing: assist for fastening bra, to don shirt LB Dressing: assist for donning socks, has elastic laces for shoes but has been able to tie shoes Toileting: Supervision, spouse will assist intermittently Bathing: Supervision, dtr setup for soap and washing back and hair Tub Shower transfers: hand held assist when stepping in/out of shower Equipment: Shower seat with back, Grab bars, Walk in shower, bed side commode, Reacher, and Long handled sponge  IADLs: Light housekeeping: husband and dtr doing all Meal Prep: husband doing all the cooking Community mobility: not driving Medication management: dtr dispenses meds in pill box and utilizes alarm for reminders  Handwriting:  TBD - pt reports different but unsure if vision vs coordination  MOBILITY STATUS: Needs Assist: Supervision to CGA for mobility with RW, utilizing hand held assist when transferring to/from car and use of w/c in the home and community  POSTURE COMMENTS:  rounded shoulders  ACTIVITY TOLERANCE: Activity tolerance: diminished  FUNCTIONAL OUTCOME MEASURES: FOTO: 64, predicted 73 in 17 visits 02/20/23: 58, pt reports it's difficult to answer as "I am so right handed".    UPPER EXTREMITY ROM:    Active ROM Right eval Left eval  Shoulder flexion WFL 160*  Shoulder abduction [facility]   Shoulder adduction    Shoulder extension    Shoulder internal rotation [facility]  Shoulder external rotation [facility]  Elbow flexion [facility] WFL  Elbow extension WFL Slight bend  Wrist flexion    Wrist extension    Wrist ulnar deviation    Wrist radial deviation    Wrist pronation    Wrist supination    (Blank rows = not tested)  UPPER EXTREMITY MMT:   Grossly 4+/5 bilaterally  HAND FUNCTION: 02/20/23: R: 34# and L: 50#  COORDINATION: 9 Hole Peg test: Right: 25.87 sec; Left: 49.78 sec Box and Blocks:  Right 57 blocks, Left 46 blocks 02/20/23: Box and  blocks: Right: 46 blocks and Left: 44 blocks 02/20/23: 9 hole peg test: L: 40.62 sec   SENSATION: Light touch: WFL Pt reports impaired sensation of hot/cold and pinch on RUE and face,   COGNITION: Overall cognitive status: Within functional limits for tasks assessed  VISION: Subjective report: "this eye (left) doesn't close.  I can see shadows/change in light" Baseline vision: Wears glasses all the time  VISION ASSESSMENT: Eye alignment: Impaired: Left eye lower lid severely droops, upper lid droops; L eye moves with R eye but no vision out of L eye  Ocular ROM: restricted on looking left and restricted on looking up Gaze preference/alignment: WDL Tracking/Visual pursuits: Decreased smoothness with vertical tracking Saccades: additional head turns occurred during testing Convergence: Impaired:   Visual Fields:  Left visual field deficits Depth perception: Undershoots  Patient has difficulty with following activities due to following visual impairments: writing, reading  PRAXIS: Impaired: Motor planning; Ataxia on RUE  OBSERVATIONS: Pt well groomed and presented very knowledgeable about current status as well as medical history.  Pt limited in mobility in home due to reporting that her husband and adult grandson are both disabled and can provide intermittent assistance.   TODAY'S TREATMENT:                                                                              DATE:  02/28/23 Putty: engaged in full grasp, "duck mouth", tip to tip, 3 point, as well as pinch and pull, and pinch and twist to simulate various grips as needed for aspects of ADLs/IADLs. Pt completing with RUE and LUE as pt with changes in sensation with use with RUE and mild ataxia with LUE.  Pt then removing 5 stones from putty with focus on pinch strength and coordination.  Pipe tree puzzle: engaged in pipe tree puzzle in standing with focus on coordination and manipulation of pieces, visual scanning, and problem  solving.  Pt demonstrating good functional use of LUE when picking up and manipulating pieces, requiring use of RUE to stabilize items intermittently.  Therapist providing min cues for sequencing and selection of appropriate length PVC pieces.  Pt with good standing balance and tolerance.    02/20/23 Measurements taken: see above for 9 hole peg test and Box and blocks assessment.  Pt demonstrating improvements with 9 hole peg test with LUE, however slower bilaterally with box and blocks.  Pt reports onset of pain in RUE with increase movement.  Coordination: w/ each UE as appropriate, including: picking up various small objects/coins and placing into container,  picking up 5-10 coins and translating palm-to-fingertips and then placing coins into coin slot, picking up coins and stacking/unstacking them.  Pt demonstrating increased difficulty with smaller, flatter items but able to complete with increased time and effort.   Sleeping: educated on positioning for improved sleeping as pt reporting pain in RUE with increased movement as well as reports of pain with sleeping.  Provided pt with handouts to facilitate carryover to home.    01/08/23 Coordination: completed 9hole peg and box and blocks (see above for measurements).  Pt with decreased motor control and coordination on L > R.  Pt reports she had been improving on with LUE coordination, however feels that things are more impacted since eye surgery last week.   Memory: pt reports having calendar of appts printed out and has shared it with her daughter to facilitate increased carryover of attendance to appts. Pt reports having [employer] to assist in recall of appointments and to set reminders, however it does not seem to recognize her voice.   Handwriting: pt with good legibility this session when writing 2-3 sentences. Therapeutic activity: engaged in large grip peg pattern replication in standing with focus on coordination and standing balance.  OT  providing CGA for standing balance and cues for sequencing with pattern.  Pt demonstrating difficulty with diagonal pattern requiring cues to count spaces to allow for sequencing and spacing.  Pt with improved control with  large grip pegs compared to smaller pegs during 9 hole peg test.  PATIENT EDUCATION: Education details: strengthening and coordination tasks, educated on visual attention and turning head for improved spatial awareness Person educated: Patient Education method: Explanation Education comprehension: verbalized understanding and needs further education  HOME EXERCISE PROGRAM: Access Code: [email or web address] URL: [email or web address] Date: 02/28/2023 Prepared by: [facility] - Outpatient  Rehab - [facility]  Exercises - Putty Squeezes  - 1 x daily - 10 reps - Rolling Putty on Table  - 1 x daily - 10 reps - Tip Pinch with Putty  - 1 x daily - 10 reps - 3-Point Pinch with Putty  - 1 x daily - 10 reps - Finger Pinch and Pull with Putty  - 1 x daily - 10 reps - Removing Marbles from Putty  - 1 x daily - 10 reps   GOALS: Goals reviewed with patient? Yes  NEW SHORT TERM GOALS: Target date: 03/15/23  Pt will be Mod I with initial neuromuscular re-education HEP for R UE. Baseline: Goal status: IN PROGRESS  2.  Pt will be able to don/doff jacket with Supervision/setup utilizing adaptive strategies PRN. Baseline:  02/20/23: pt's husband still assisting with jacket, especially L side due to visual impairment Goal status: IN PROGRESS  3.  Pt will verbalize understanding of compensatory strategies to accommodate for decreased vision in L visual field. Baseline:  02/20/23: pt reports still having difficulty, have not been able to truly address as pt has had large gap between visits  Goal status: IN PROGRESS  4.  Pt will verbalize understanding of energy conservation strategies and report 2 strategies being utilized during ADLs/IADLs to increase safety and  independence. Baseline:  02/20/23: have not been able to fully address due to large gap between visits Goal status: IN PROGRESS   LONG TERM GOALS: Target date: 04/05/23  Pt will demonstrate improved fine motor coordination for ADLs (clothing fasteners) as evidenced by decreasing 9 hole peg test score for LUE to < 35 seconds. Baseline: L: 49.78 sec on 01/08/23 02/20/23 - L: 40.62 sec  Goal status: IN PROGRESS  2.  Pt will demonstrate improved motor control to safely retrieve a moderate weight object at from shelf at mod-high range.  Baseline:  Goal status: IN PROGRESS  3.  Pt will demonstrate improved grip strength as needed to open new/tight food containers. Baseline:  Goal status: IN PROGRESS  4.  Pt will demonstrate improved coordination and motor control with LUE during bimanual tasks requiring increased weight and/or force. Baseline:  Goal status: IN PROGRESS   ASSESSMENT:  CLINICAL IMPRESSION: Pt tolerating seated and standing activities this session.  Pt continues to demonstrate mild LUE ataxia with gross and fine motor control tasks, reporting benefiting from tactile cues/pressure for truncal ataxia/instability.  Pt demonstrating good understanding and attention to coordination and visual scanning/perceptual tasks with good understanding of recommendation for head turns to increase attention to L.  Pt will benefit from continued skilled occupational therapy services to address strength and coordination, ROM, pain management, altered sensation, balance, GM/FM control, safety awareness, introduction of compensatory strategies/AE prn, visual-perception, and implementation of an HEP to improve participation and safety during ADLs and IADLs.     PERFORMANCE DEFICITS: in functional skills including ADLs, IADLs, coordination, sensation, ROM, strength, pain, Fine motor control, Gross motor control, mobility, balance, body mechanics, endurance, decreased knowledge of precautions, decreased  knowledge of use of DME, vision, and UE functional use and psychosocial skills including coping strategies, environmental adaptation,  and routines and behaviors.     PLAN:  OT FREQUENCY: 2x/week  OT DURATION: 6 weeks  PLANNED INTERVENTIONS: 97168 OT Re-evaluation, 97535 self care/ADL training, 40981 therapeutic exercise, 97530 therapeutic activity, 97112 neuromuscular re-education, 97035 ultrasound, 97010 moist heat, 97010 cryotherapy, 97760 Orthotics management and training, balance training, functional mobility training, compression bandaging, visual/perceptual remediation/compensation, psychosocial skills training, energy conservation, coping strategies training, patient/family education, and DME and/or AE instructions  RECOMMENDED OTHER SERVICES: NA  CONSULTED AND AGREED WITH PLAN OF CARE: Patient  PLAN FOR NEXT SESSION: Educate on don/doff jacket, LB dressing strategies; continue coordination, reaching, standing activities; educate on visual and sensation compensatory strategies   Rosalio Loud, OTR/L 02/28/2023, 11:14 AM   [facility] at [facility] [address] [place], [place] [address] Phone # [phone] Fax # [phone]

## 2023-03-05 ENCOUNTER — Ambulatory Visit: Payer: Commercial Managed Care - HMO | Admitting: Occupational Therapy

## 2023-03-05 ENCOUNTER — Ambulatory Visit: Payer: Commercial Managed Care - HMO | Admitting: Physical Therapy

## 2023-03-07 NOTE — Progress Notes (Unsigned)
Subjective:    Patient ID: Joan Robinson, female    DOB: [birth year], 63 y.o.   MRN: [id number]  HPI  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 Enema or suppository use No  History of colostomy No  Incontinent No   BLADDER Normal In and out cath, frequency . Able to self cath  . Bladder incontinence No  Frequent urination No  Leakage with coughing No  Difficulty starting stream No  Incomplete bladder emptying No    Mobility walk with assistance how many minutes can you walk? 2-3 ability to climb steps?  yes do you drive?  no  Function disabled: date disabled . retired I need assistance with the following:  dressing, bathing, toileting, meal prep, household duties, and shopping  Neuro/Psych bowel control problems weakness numbness tingling trouble walking anxiety  Prior Studies Hospital f/u  Physicians involved in your care Hospital f/u   Family History  Problem Relation Age of Onset   Heart disease Mother 20       stents   Stroke Mother    Hyperlipidemia Mother    Hypertension Mother    Other Mother        blood disorder- mgus   Colon polyps Father    Other Father        aorta disection   Aneurysm Father    Hypertension Sister        ?   Arthritis Sister    Other Sister        thyroid   Other Sister        thyroid   Stroke Maternal Aunt    Parkinson's disease Maternal Uncle    Colon cancer Paternal Uncle    Irritable bowel syndrome Daughter    Other Daughter         gastritis   Mental illness Daughter        bipolar and mood disorder   Mental illness Son        bipolar   Social History   Socioeconomic History   Marital status: Married    Spouse name: Not on file   Number of children: 2   Years of education: Not on file   Highest education level: Not on file  Occupational History   Occupation: Accounts Receivable  Tobacco Use   Smoking status: Former    Current packs/day: 0.00    Average packs/day: 1 pack/day for 30.0 years (30.0 ttl pk-yrs)    Types: Cigarettes    Start date: 05/16/1979    Quit date: 05/15/2009    Years since quitting: 13.8   Smokeless tobacco: Never  Vaping Use   Vaping status: Never Used  Substance and Sexual Activity   Alcohol use: No   Drug use: No   Sexual activity: Never  Other Topics Concern   Not on file  Social History Narrative   Not on file   Social Drivers of Health   Financial Resource Strain:  Low Risk  (02/26/2023)   Received from [facility]   Overall Financial Resource Strain (CARDIA)    Difficulty of Paying Living Expenses: Not very hard  Food Insecurity: No Food Insecurity (02/26/2023)   Received from [facility]   Hunger Vital Sign    Worried About Running Out of Food in the Last Year: Never true    Ran Out of Food in the Last Year: Never true  Transportation Needs: No Transportation Needs (02/26/2023)   Received from [facility] - Transportation    Lack of Transportation (Medical): No    Lack of Transportation (Non-Medical): No  Physical Activity: Sufficiently Active (05/29/2022)   Received from [facility], [facility]   Exercise Vital Sign    Days of Exercise per Week: 5 days    Minutes of Exercise per Session: 30 min  Stress: Stress Concern Present (05/29/2022)   Received from [facility], [facility] Occupational [facility] - Occupational Stress Questionnaire    Feeling of Stress : To some extent  Social Connections: Moderately Integrated  (05/29/2022)   Received from [facility], [facility]   Social Network    How would you rate your social network (family, work, friends)?: Adequate participation with social networks   Past Surgical History:  Procedure Laterality Date   ABDOMINAL HYSTERECTOMY     ABDOMINAL HYSTERECTOMY  01/15/2009   complete   COLONOSCOPY     DILATION AND CURETTAGE OF UTERUS  01/16/1983   EYE SURGERY  03/03/2014   Surgery on both eyes for epiretinal membrane (vitreous peel)   Gated Spect wall motion stress cardiolite  11/05/2001   HIATAL HERNIA REPAIR N/A 07/12/2021   Procedure: LAPAROSCOPY W/ EXTENSIVE FOREGUT DISSECTION; PARTIAL STOMACH REDUCTION; GASTROSTOMY TUBE PLACEMENT; GASTROPEXY;  Surgeon: Luretha Murphy, MD;  Location: [facility];  Service: General;  Laterality: N/A;   LAPAROSCOPIC RIGHT HEMI COLECTOMY Right 10/16/2022   Procedure: LAPAROSCOPIC ASSISTED RIGHT HEMI COLECTOMY;  Surgeon: Andria Meuse, MD;  Location: [facility] OR;  Service: General;  Laterality: Right;   POLYPECTOMY     TUBAL LIGATION  01/15/1993   UTERINE SUSPENSION     mesh   VITRECTOMY Bilateral 03/03/2014   XI ROBOTIC ASSISTED HIATAL HERNIA REPAIR N/A 05/01/2021   Procedure: XI ROBOTIC ASSISTED TYPE III HIATAL HERNIA REPAIR WITH FUNDOPLICATION;  Surgeon: Luretha Murphy, MD;  Location: [facility];  Service: General;  Laterality: N/A;   Past Medical History:  Diagnosis Date   Allergic rhinitis    Anemia 10/10/2011   Anxiety and depression 01/17/2007   Qualifier: Diagnosis of  By: Everardo All MD, Sean A    Arthritis 07/24/2013   Likely inflammatory and following with Dr Maryln Gottron of [facility]  rheumatology   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   BCC (basal cell carcinoma of skin) 06/01/2012   Leg Follows with Dr Margo Aye   Bipolar disorder (HCC) 01/17/2007   Qualifier: Diagnosis of  By: Everardo All MD, Gregary Signs A    Cataract    Diverticulosis    Diverticulosis    Emphysema of lung (HCC)    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  Opioid Risk Score:   Fall Risk Score:  `1  Depression screen [facility] 2/9     12/07/2022   11:58 AM 02/02/2017    2:39 PM 12/23/2015    2:13 PM 08/22/2015   12:37 PM 10/07/2014    1:55 PM 09/28/2014    4:11 PM 04/30/2014    8:43 AM  Depression screen PHQ 2/9  Decreased Interest 1 0 0 0 0 0 0  Down, Depressed, Hopeless 0 0 0 0 0 0 1  PHQ - 2 Score 1 0 0 0 0 0 1  Altered sleeping 1        Tired, decreased energy 2        Change in appetite 0        Feeling bad or failure about yourself  2        Trouble concentrating 0        Moving slowly or fidgety/restless 1        Suicidal thoughts 0        PHQ-9 Score 7        Difficult doing work/chores Extremely dIfficult           Review of Systems  Neurological:  Positive for weakness and numbness.  Psychiatric/Behavioral:  The patient is nervous/anxious.   All other systems reviewed and are negative.      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  -Discussed benefits of exercise in reducing pain. -Discussed following foods that may reduce pain: 1) Ginger (especially studied for arthritis)- reduce leukotriene production to decrease inflammation 2) Blueberries- high in phytonutrients that decrease inflammation 3) Salmon- marine omega-3s reduce joint swelling and pain 4) Pumpkin seeds- reduce inflammation 5) dark chocolate- reduces inflammation 6) turmeric- reduces inflammation 7) tart cherries - reduce pain and stiffness 8) extra virgin olive oil - its compound olecanthal helps to block prostaglandins  9) chili peppers- can be eaten or applied topically via capsaicin 10) mint- helpful for headache, muscle aches, joint pain, and itching 11) garlic- reduces inflammation  Link to further information on diet for chronic pain: [email or web address]   6) Fatigue: -discussed likely stroke related

## 2023-03-08 ENCOUNTER — Encounter: Payer: Self-pay | Admitting: Physical Medicine and Rehabilitation

## 2023-03-08 ENCOUNTER — Encounter
Payer: Commercial Managed Care - HMO | Attending: Physical Medicine and Rehabilitation | Admitting: Physical Medicine and Rehabilitation

## 2023-03-08 VITALS — BP 142/81 | HR 60 | Ht 66.0 in | Wt 147.0 lb

## 2023-03-08 DIAGNOSIS — I63532 Cerebral infarction due to unspecified occlusion or stenosis of left posterior cerebral artery: Secondary | ICD-10-CM | POA: Diagnosis not present

## 2023-03-08 DIAGNOSIS — R0602 Shortness of breath: Secondary | ICD-10-CM | POA: Diagnosis present

## 2023-03-08 DIAGNOSIS — R5383 Other fatigue: Secondary | ICD-10-CM | POA: Insufficient documentation

## 2023-03-08 DIAGNOSIS — H9190 Unspecified hearing loss, unspecified ear: Secondary | ICD-10-CM | POA: Diagnosis not present

## 2023-03-08 NOTE — Patient Instructions (Signed)
Red light therapy [place]

## 2023-03-13 ENCOUNTER — Ambulatory Visit: Payer: Commercial Managed Care - HMO | Admitting: Occupational Therapy

## 2023-03-13 ENCOUNTER — Ambulatory Visit: Payer: Commercial Managed Care - HMO

## 2023-03-13 DIAGNOSIS — R2689 Other abnormalities of gait and mobility: Secondary | ICD-10-CM | POA: Diagnosis present

## 2023-03-13 DIAGNOSIS — I69354 Hemiplegia and hemiparesis following cerebral infarction affecting left non-dominant side: Secondary | ICD-10-CM

## 2023-03-13 DIAGNOSIS — M6281 Muscle weakness (generalized): Secondary | ICD-10-CM

## 2023-03-13 DIAGNOSIS — R1312 Dysphagia, oropharyngeal phase: Secondary | ICD-10-CM | POA: Diagnosis present

## 2023-03-13 DIAGNOSIS — R471 Dysarthria and anarthria: Secondary | ICD-10-CM | POA: Diagnosis present

## 2023-03-13 DIAGNOSIS — R41842 Visuospatial deficit: Secondary | ICD-10-CM

## 2023-03-13 DIAGNOSIS — R208 Other disturbances of skin sensation: Secondary | ICD-10-CM

## 2023-03-13 DIAGNOSIS — R278 Other lack of coordination: Secondary | ICD-10-CM | POA: Diagnosis present

## 2023-03-13 DIAGNOSIS — R2681 Unsteadiness on feet: Secondary | ICD-10-CM | POA: Diagnosis present

## 2023-03-13 NOTE — Therapy (Signed)
 OUTPATIENT OCCUPATIONAL THERAPY NEURO  Treatment Note   Patient Name: Joan Robinson MRN: [id number] DOB:[birth year], 63 y.o., female Today's Date: 03/13/2023  PCP: April Manson, NP REFERRING PROVIDER: Micki Riley, MD  END OF SESSION:  OT End of Session - 03/13/23 1109     Visit Number 5    Number of Visits 17    Date for OT Re-Evaluation 04/05/23    Authorization Type [employer] [id number]    Authorization Time Period 30 visits combined OT/PT/ST    OT Start Time 1105    OT Stop Time 1145    OT Time Calculation (min) 40 min    Behavior During Therapy [facility] for tasks assessed/performed                 Past Medical History:  Diagnosis Date   Allergic rhinitis    Anemia 10/10/2011   Anxiety and depression 01/17/2007   Qualifier: Diagnosis of  By: Everardo All MD, Sean A    Arthritis 07/24/2013   Likely inflammatory and following with Dr Maryln Gottron of [facility]  rheumatology   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   BCC (basal cell carcinoma of skin) 06/01/2012   Leg Follows with Dr Margo Aye   Bipolar disorder (HCC) 01/17/2007   Qualifier: Diagnosis of  By: Everardo All MD, Sean A    Cataract    Diverticulosis    Diverticulosis    Emphysema of lung (HCC)    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   Gallstones    GERD (gastroesophageal reflux disease)    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   Hashimoto's disease    Hyperlipidemia    Hypertension    Hypothyroidism 08/24/2006   Qualifier: Diagnosis of  By: Everardo All MD, Sean A     IBS (irritable bowel syndrome) 07/27/2016   Obesity 11/01/2013   PUD (peptic ulcer disease)    Sleep apnea 04/27/2016   Tobacco abuse    Past Surgical History:  Procedure Laterality Date   ABDOMINAL HYSTERECTOMY     ABDOMINAL HYSTERECTOMY  01/15/2009   complete   COLONOSCOPY     DILATION AND CURETTAGE OF UTERUS  01/16/1983   EYE SURGERY  03/03/2014   Surgery on both eyes for epiretinal membrane (vitreous peel)   Gated Spect wall motion stress cardiolite  11/05/2001    HIATAL HERNIA REPAIR N/A 07/12/2021   Procedure: LAPAROSCOPY W/ EXTENSIVE FOREGUT DISSECTION; PARTIAL STOMACH REDUCTION; GASTROSTOMY TUBE PLACEMENT; GASTROPEXY;  Surgeon: Luretha Murphy, MD;  Location: [facility];  Service: General;  Laterality: N/A;   LAPAROSCOPIC RIGHT HEMI COLECTOMY Right 10/16/2022   Procedure: LAPAROSCOPIC ASSISTED RIGHT HEMI COLECTOMY;  Surgeon: Andria Meuse, MD;  Location: [facility] OR;  Service: General;  Laterality: Right;   POLYPECTOMY     TUBAL LIGATION  01/15/1993   UTERINE SUSPENSION     mesh   VITRECTOMY Bilateral 03/03/2014   XI ROBOTIC ASSISTED HIATAL HERNIA REPAIR N/A 05/01/2021   Procedure: XI ROBOTIC ASSISTED TYPE III HIATAL HERNIA REPAIR WITH FUNDOPLICATION;  Surgeon: Luretha Murphy, MD;  Location: [facility];  Service: General;  Laterality: N/A;   Patient Active Problem List   Diagnosis Date Noted   Dysphagia 11/05/2022   Vestibular dizziness 11/05/2022   Malnutrition of moderate degree 11/01/2022   Acute stroke due to ischemia (HCC) 10/25/2022   CVA (cerebral vascular accident) (HCC) 10/25/2022   Protein-calorie malnutrition, severe 10/19/2022   Diverticulosis 10/14/2022   History of emphysema (HCC) 10/14/2022   SBO (small bowel obstruction) (HCC) 10/13/2022   Aortic valve calcification 09/20/2022   Abdominal  aortic atherosclerosis (HCC) 09/20/2022   Hiatal hernia 07/12/2021   Status post laparoscopic Nissen fundoplication 05/01/2021   IBS (irritable bowel syndrome) 07/27/2016   High-tone pelvic floor dysfunction 05/09/2016   Midline cystocele 05/09/2016   Vaginal atrophy 05/09/2016   Bladder prolapse, female, acquired 05/07/2016   Dystrophic nail 04/29/2016   Fatigue 04/27/2016   Sleep apnea 04/27/2016   UTI (urinary tract infection) 09/11/2015   Cataract cortical, senile, left 12/20/2014   Rectal lesion 10/10/2014   Other type of migraine without status migrainosus 05/06/2014   Dyspepsia 05/06/2014   Postmenopausal estrogen deficiency  05/06/2014   Screening for breast cancer 05/06/2014   Bilateral low-tension glaucoma, indeterminate stage 12/29/2013   High myopia, bilateral 12/29/2013   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   Obesity 11/01/2013   Eustachian tube dysfunction 10/08/2013   Otalgia of left ear 10/08/2013   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   Arthritis 07/24/2013   Anxiety attack 03/08/2013   Paresthesia 12/22/2012   Headache 08/17/2012   BCC (basal cell carcinoma of skin) 06/01/2012   Left arm pain 05/10/2012   Right knee pain 05/10/2012   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   ADD (attention deficit disorder) 04/13/2012   Perimenopausal 01/16/2012   Macular pucker of both eyes 10/18/2011   Vitreous degeneration 10/18/2011   Chronic anemia 10/10/2011   Preventative health care 10/10/2011   Urticaria, chronic 10/10/2011   Macular pucker, bilateral 10/10/2011   Lower back pain    DIVERTICULITIS-COLON 07/25/2009   Diverticulitis of colon 07/25/2009   Hyperlipidemia 06/09/2009   Essential hypertension 06/09/2009   History of esophageal stricture 09/06/2008   GERD (gastroesophageal reflux disease) 07/27/2008   Bipolar disorder (HCC) 01/17/2007   Hypothyroidism 08/24/2006   Allergic rhinitis 08/24/2006   URINARY INCONTINENCE 08/24/2006    ONSET DATE: 10/13/22  REFERRING DIAG: I63.9 (ICD-10-CM) - Brainstem stroke  THERAPY DIAG:  Unsteadiness on feet  Other abnormalities of gait and mobility  Muscle weakness (generalized)  Hemiplegia and hemiparesis following cerebral infarction affecting left non-dominant side (HCC)  Other disturbances of skin sensation  Other lack of coordination  Visuospatial deficit  Rationale for Evaluation and Treatment: Rehabilitation  SUBJECTIVE:   SUBJECTIVE STATEMENT: Pt reports receiving Medicaid.  Pt wearing sunglasses initially as she reports sensitivity to light.  Pt reports going for swallow study  tomorrow.  Also reports possibility of cochlear implant for her hearing.  Pt accompanied by: self and significant other (husband dropped her off)  PERTINENT HISTORY: admitted 9/28 with RUQ pain with SBO. 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 PMhx: HTN, HLD, peptic ulcer disease, diverticulitis, bipolar disorder, hypothyroidism, GERD, Hashimoto's disease.  PRECAUTIONS: Fall  WEIGHT BEARING RESTRICTIONS:  lifting restrictions: not to lift anything > gallon of milk for 2 weeks  PAIN:  Are you having pain? Yes: NPRS scale: 5/10 Pain location: lower back Pain description: constant throbbing, pulling sensation Aggravating factors: bending, lifting Relieving factors: rest  FALLS: Has patient fallen in last 6 months? Yes. Number of falls 1 fall prior to stroke and fell when out feeding the animals  LIVING ENVIRONMENT: Lives with: lives with their spouse and adult grandson with disabilities (dtr comes every other day to assist with shower) Lives in: House/apartment Stairs: Yes: External: 6 steps in front, 10 steps at back steps; on left going up; was to have ramp built but this has not happened yet and is managing steps with assist from husband Has following equipment at  home: Single point cane, Walker - 2 wheeled, Environmental consultant - 4 wheeled, Wheelchair (manual), shower chair, bed side commode, and Grab bars (next to toilet and 2 in the shower)  PLOF: Independent and Independent with basic ADLs  PATIENT GOALS: I want to get back to doing as much as I can.  OBJECTIVE:  Note: Objective measures were completed at Evaluation unless otherwise noted.  HAND DOMINANCE: Right  ADLs: Transfers/ambulation related to ADLs: w/c in the house or walking with RW when dtr is in the house, short distance walking from house to the car with cane and/or physical assistance from spouse Eating: husband cutting foods, pt utilizing straw for drinking,  and does spill drinks and food intermittently  Grooming: Mod I  UB Dressing: assist for fastening bra, to don shirt LB Dressing: assist for donning socks, has elastic laces for shoes but has been able to tie shoes Toileting: Supervision, spouse will assist intermittently Bathing: Supervision, dtr setup for soap and washing back and hair Tub Shower transfers: hand held assist when stepping in/out of shower Equipment: Shower seat with back, Grab bars, Walk in shower, bed side commode, Reacher, and Long handled sponge  IADLs: Light housekeeping: husband and dtr doing all Meal Prep: husband doing all the cooking Community mobility: not driving Medication management: dtr dispenses meds in pill box and utilizes alarm for reminders  Handwriting:  TBD - pt reports different but unsure if vision vs coordination  MOBILITY STATUS: Needs Assist: Supervision to CGA for mobility with RW, utilizing hand held assist when transferring to/from car and use of w/c in the home and community  POSTURE COMMENTS:  rounded shoulders  ACTIVITY TOLERANCE: Activity tolerance: diminished  FUNCTIONAL OUTCOME MEASURES: FOTO: 64, predicted 73 in 17 visits 02/20/23: 58, pt reports it's difficult to answer as "I am so right handed".    UPPER EXTREMITY ROM:    Active ROM Right eval Left eval  Shoulder flexion WFL 160*  Shoulder abduction [facility]   Shoulder adduction    Shoulder extension    Shoulder internal rotation [facility]  Shoulder external rotation [facility]  Elbow flexion [facility] WFL  Elbow extension WFL Slight bend  Wrist flexion    Wrist extension    Wrist ulnar deviation    Wrist radial deviation    Wrist pronation    Wrist supination    (Blank rows = not tested)  UPPER EXTREMITY MMT:   Grossly 4+/5 bilaterally  HAND FUNCTION: 02/20/23: R: 34# and L: 50#  COORDINATION: 9 Hole Peg test: Right: 25.87 sec; Left: 49.78 sec Box and Blocks:  Right 57 blocks, Left 46 blocks 02/20/23: Box and blocks: Right: 46  blocks and Left: 44 blocks 02/20/23: 9 hole peg test: L: 40.62 sec   SENSATION: Light touch: WFL Pt reports impaired sensation of hot/cold and pinch on RUE and face,   COGNITION: Overall cognitive status: Within functional limits for tasks assessed  VISION: Subjective report: "this eye (left) doesn't close.  I can see shadows/change in light" Baseline vision: Wears glasses all the time  VISION ASSESSMENT: Eye alignment: Impaired: Left eye lower lid severely droops, upper lid droops; L eye moves with R eye but no vision out of L eye  Ocular ROM: restricted on looking left and restricted on looking up Gaze preference/alignment: WDL Tracking/Visual pursuits: Decreased smoothness with vertical tracking Saccades: additional head turns occurred during testing Convergence: Impaired:   Visual Fields: Left visual field deficits Depth perception: Undershoots  Patient has difficulty with following activities due  to following visual impairments: writing, reading  PRAXIS: Impaired: Motor planning; Ataxia on RUE  OBSERVATIONS: Pt well groomed and presented very knowledgeable about current status as well as medical history.  Pt limited in mobility in home due to reporting that her husband and adult grandson are both disabled and can provide intermittent assistance.   TODAY'S TREATMENT:                                                                              DATE:  03/13/23 Self-care: educated on visit limit with Medicaid and possibility of local pro-bono clinics from continued therapy as needed/appropriate.   Don/doff jacket: pt donned/doffed jacket both in sitting and standing, donning R arm first and then L arm first with focus on quality of movement.  Pt able to don and doff jacket in both sitting and standing, donning alternating arm first.  OT providing cue to attend to task and "slow down" to ensure proper setup to increase ease. Energy conservation: educated on 4P's of energy  conservation (planning, prioritizing, pacing, and positioning) and providing cues and examples for each.  Pt reports sitting when bathing, having someone bring a pile or box to her sitting in recliner and making piles as she is organizing and decluttering. Coordination: w/ LUE/RUEas appropriate, including: picking up 5-10 coins 1 at a time and placing into coin slot, and picking up 5-10 coins 1 at a time and translating from palm to finger tips to place into coin slot.  Pt demonstrating increased gross and fine motor ataxia on LUE > RUE.   9 hole peg test: 29.47 seconds.  Pt still demonstrating mild gross motor ataxia, but much improved fine motor control this session.    02/28/23 Putty: engaged in full grasp, "duck mouth", tip to tip, 3 point, as well as pinch and pull, and pinch and twist to simulate various grips as needed for aspects of ADLs/IADLs. Pt completing with RUE and LUE as pt with changes in sensation with use with RUE and mild ataxia with LUE.  Pt then removing 5 stones from putty with focus on pinch strength and coordination.  Pipe tree puzzle: engaged in pipe tree puzzle in standing with focus on coordination and manipulation of pieces, visual scanning, and problem solving.  Pt demonstrating good functional use of LUE when picking up and manipulating pieces, requiring use of RUE to stabilize items intermittently.  Therapist providing min cues for sequencing and selection of appropriate length PVC pieces.  Pt with good standing balance and tolerance.    02/20/23 Measurements taken: see above for 9 hole peg test and Box and blocks assessment.  Pt demonstrating improvements with 9 hole peg test with LUE, however slower bilaterally with box and blocks.  Pt reports onset of pain in RUE with increase movement.  Coordination: w/ each UE as appropriate, including: picking up various small objects/coins and placing into container,  picking up 5-10 coins and translating palm-to-fingertips and then  placing coins into coin slot, picking up coins and stacking/unstacking them.  Pt demonstrating increased difficulty with smaller, flatter items but able to complete with increased time and effort.   Sleeping: educated on positioning for improved sleeping as pt reporting pain  in RUE with increased movement as well as reports of pain with sleeping.  Provided pt with handouts to facilitate carryover to home.  PATIENT EDUCATION: Education details: educated on visual attention and turning head for improved spatial awareness Person educated: Patient Education method: Explanation Education comprehension: verbalized understanding and needs further education  HOME EXERCISE PROGRAM: Access Code: [email or web address] URL: [email or web address] Date: 02/28/2023 Prepared by: [facility] - Outpatient  Rehab - [facility]  Exercises - Putty Squeezes  - 1 x daily - 10 reps - Rolling Putty on Table  - 1 x daily - 10 reps - Tip Pinch with Putty  - 1 x daily - 10 reps - 3-Point Pinch with Putty  - 1 x daily - 10 reps - Finger Pinch and Pull with Putty  - 1 x daily - 10 reps - Removing Marbles from Putty  - 1 x daily - 10 reps   GOALS: Goals reviewed with patient? Yes  NEW SHORT TERM GOALS: Target date: 03/15/23  Pt will be Mod I with initial neuromuscular re-education HEP for R UE. Baseline: Goal status: MET - reports completing coordination and putty exercises "couple times a week" on 03/13/23  2.  Pt will be able to don/doff jacket with Supervision/setup utilizing adaptive strategies PRN. Baseline:  02/20/23: pt's husband still assisting with jacket, especially L side due to visual impairment Goal status: MET - 03/13/23  3.  Pt will verbalize understanding of compensatory strategies to accommodate for decreased vision in L visual field. Baseline:  02/20/23: pt reports still having difficulty, have not been able to truly address as pt has had large gap between visits  Goal status: IN  PROGRESS  4.  Pt will verbalize understanding of energy conservation strategies and report 2 strategies being utilized during ADLs/IADLs to increase safety and independence. Baseline:  02/20/23: have not been able to fully address due to large gap between visits Goal status: MET - 03/13/23   LONG TERM GOALS: Target date: 04/05/23  Pt will demonstrate improved fine motor coordination for ADLs (clothing fasteners) as evidenced by decreasing 9 hole peg test score for LUE to < 35 seconds. Baseline: L: 49.78 sec on 01/08/23 02/20/23 - L: 40.62 sec  Goal status: MET - 29.47 sec on 03/13/23  2.  Pt will demonstrate improved motor control to safely retrieve a moderate weight object at from shelf at mod-high range.  Baseline:  Goal status: IN PROGRESS  3.  Pt will demonstrate improved grip strength as needed to open new/tight food containers. Baseline:  Goal status: IN PROGRESS  4.  Pt will demonstrate improved coordination and motor control with LUE during bimanual tasks requiring increased weight and/or force. Baseline:  Goal status: IN PROGRESS   ASSESSMENT:  CLINICAL IMPRESSION: Pt is making good progress towards STGs, meeting 3 of 4 and meeting 1 LTG.  Pt is demonstrating improved standing tolerance, balance, and ability to compensate for impaired vision and hearing on L side of body.  Pt demonstrating improvements with fine motor control and verbalizing understanding of energy conservation and compensatory strategies to increase safety.  Pt will benefit from continued skilled occupational therapy services to address strength and coordination, ROM, pain management, altered sensation, balance, GM/FM control, safety awareness, introduction of compensatory strategies/AE prn, visual-perception, and implementation of an HEP to improve participation and safety during ADLs and IADLs.     PERFORMANCE DEFICITS: in functional skills including ADLs, IADLs, coordination, sensation, ROM, strength, pain,  Fine motor control, Gross motor control, mobility, balance, body  mechanics, endurance, decreased knowledge of precautions, decreased knowledge of use of DME, vision, and UE functional use and psychosocial skills including coping strategies, environmental adaptation, and routines and behaviors.     PLAN:  OT FREQUENCY: 2x/week  OT DURATION: 6 weeks  PLANNED INTERVENTIONS: 97168 OT Re-evaluation, 97535 self care/ADL training, 25956 therapeutic exercise, 97530 therapeutic activity, 97112 neuromuscular re-education, 97035 ultrasound, 97010 moist heat, 97010 cryotherapy, 97760 Orthotics management and training, balance training, functional mobility training, compression bandaging, visual/perceptual remediation/compensation, psychosocial skills training, energy conservation, coping strategies training, patient/family education, and DME and/or AE instructions  RECOMMENDED OTHER SERVICES: NA  CONSULTED AND AGREED WITH PLAN OF CARE: Patient  PLAN FOR NEXT SESSION: Educate on LB dressing strategies; continue coordination, reaching, standing activities; educate on visual and sensation compensatory strategies  Engage in WB through BUE in standing at counter top or elevated mat to focus on ataxia/gross motor control prior to [facility]   Brightwood, Izacc Demeyer, OTR/L 03/13/2023, 11:24 AM   [facility] Re[facility] at [facility] [address], [address] [place], [place] [address] Phone # [phone] Fax # [phone]

## 2023-03-13 NOTE — Therapy (Signed)
 OUTPATIENT PHYSICAL THERAPY NEURO TREATMENT   Patient Name: Joan Robinson MRN: [id number] DOB:21-Aug-1960, 63 y.o., female Today's Date: 03/13/2023   PCP: April Manson, NP REFERRING PROVIDER: Micki Riley, MD   END OF SESSION:  PT End of Session - 03/13/23 1139     Visit Number 5    Number of Visits 9    Date for PT Re-Evaluation 04/03/23    Authorization Type [employer]    Authorization - Visit Number 3    Authorization - Number of Visits 10   30 combined   PT Start Time 1145    PT Stop Time 1230    PT Time Calculation (min) 45 min    Equipment Utilized During Treatment Gait belt    Activity Tolerance Patient tolerated treatment well    Behavior During Therapy WFL for tasks assessed/performed               Past Medical History:  Diagnosis Date   Allergic rhinitis    Anemia 10/10/2011   Anxiety and depression 01/17/2007   Qualifier: Diagnosis of  By: Everardo All MD, Sean A    Arthritis 07/24/2013   Likely inflammatory and following with Dr Maryln Gottron of [facility]  rheumatology   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   BCC (basal cell carcinoma of skin) 06/01/2012   Leg Follows with Dr Margo Aye   Bipolar disorder (HCC) 01/17/2007   Qualifier: Diagnosis of  By: Everardo All MD, Sean A    Cataract    Diverticulosis    Diverticulosis    Emphysema of lung (HCC)    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   Gallstones    GERD (gastroesophageal reflux disease)    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   Hashimoto's disease    Hyperlipidemia    Hypertension    Hypothyroidism 08/24/2006   Qualifier: Diagnosis of  By: Everardo All MD, Sean A     IBS (irritable bowel syndrome) 07/27/2016   Obesity 11/01/2013   PUD (peptic ulcer disease)    Sleep apnea 04/27/2016   Tobacco abuse    Past Surgical History:  Procedure Laterality Date   ABDOMINAL HYSTERECTOMY     ABDOMINAL HYSTERECTOMY  01/15/2009   complete   COLONOSCOPY     DILATION AND CURETTAGE OF UTERUS  01/16/1983   EYE SURGERY  03/03/2014    Surgery on both eyes for epiretinal membrane (vitreous peel)   Gated Spect wall motion stress cardiolite  11/05/2001   HIATAL HERNIA REPAIR N/A 07/12/2021   Procedure: LAPAROSCOPY W/ EXTENSIVE FOREGUT DISSECTION; PARTIAL STOMACH REDUCTION; GASTROSTOMY TUBE PLACEMENT; GASTROPEXY;  Surgeon: Luretha Murphy, MD;  Location: [facility];  Service: General;  Laterality: N/A;   LAPAROSCOPIC RIGHT HEMI COLECTOMY Right 10/16/2022   Procedure: LAPAROSCOPIC ASSISTED RIGHT HEMI COLECTOMY;  Surgeon: Andria Meuse, MD;  Location: [facility];  Service: General;  Laterality: Right;   POLYPECTOMY     TUBAL LIGATION  01/15/1993   UTERINE SUSPENSION     mesh   VITRECTOMY Bilateral 03/03/2014   XI ROBOTIC ASSISTED HIATAL HERNIA REPAIR N/A 05/01/2021   Procedure: XI ROBOTIC ASSISTED TYPE III HIATAL HERNIA REPAIR WITH FUNDOPLICATION;  Surgeon: Luretha Murphy, MD;  Location: [facility];  Service: General;  Laterality: N/A;   Patient Active Problem List   Diagnosis Date Noted   Dysphagia 11/05/2022   Vestibular dizziness 11/05/2022   Malnutrition of moderate degree 11/01/2022   Acute stroke due to ischemia [facility]) 10/25/2022   CVA (cerebral vascular accident) (HCC) 10/25/2022   Protein-calorie malnutrition, severe 10/19/2022   Diverticulosis  10/14/2022   History of emphysema (HCC) 10/14/2022   SBO (small bowel obstruction) (HCC) 10/13/2022   Aortic valve calcification 09/20/2022   Abdominal aortic atherosclerosis (HCC) 09/20/2022   Hiatal hernia 07/12/2021   Status post laparoscopic Nissen fundoplication 05/01/2021   IBS (irritable bowel syndrome) 07/27/2016   High-tone pelvic floor dysfunction 05/09/2016   Midline cystocele 05/09/2016   Vaginal atrophy 05/09/2016   Bladder prolapse, female, acquired 05/07/2016   Dystrophic nail 04/29/2016   Fatigue 04/27/2016   Sleep apnea 04/27/2016   UTI (urinary tract infection) 09/11/2015   Cataract cortical, senile, left 12/20/2014   Rectal lesion 10/10/2014   Other type  of migraine without status migrainosus 05/06/2014   Dyspepsia 05/06/2014   Postmenopausal estrogen deficiency 05/06/2014   Screening for breast cancer 05/06/2014   Bilateral low-tension glaucoma, indeterminate stage 12/29/2013   High myopia, bilateral 12/29/2013   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   Obesity 11/01/2013   Eustachian tube dysfunction 10/08/2013   Otalgia of left ear 10/08/2013   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   Arthritis 07/24/2013   Anxiety attack 03/08/2013   Paresthesia 12/22/2012   Headache 08/17/2012   BCC (basal cell carcinoma of skin) 06/01/2012   Left arm pain 05/10/2012   Right knee pain 05/10/2012   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   ADD (attention deficit disorder) 04/13/2012   Perimenopausal 01/16/2012   Macular pucker of both eyes 10/18/2011   Vitreous degeneration 10/18/2011   Chronic anemia 10/10/2011   Preventative health care 10/10/2011   Urticaria, chronic 10/10/2011   Macular pucker, bilateral 10/10/2011   Lower back pain    DIVERTICULITIS-COLON 07/25/2009   Diverticulitis of colon 07/25/2009   Hyperlipidemia 06/09/2009   Essential hypertension 06/09/2009   History of esophageal stricture 09/06/2008   GERD (gastroesophageal reflux disease) 07/27/2008   Bipolar disorder (HCC) 01/17/2007   Hypothyroidism 08/24/2006   Allergic rhinitis 08/24/2006   URINARY INCONTINENCE 08/24/2006    ONSET DATE: 10/13/2022  REFERRING DIAG: I63.9 (ICD-10-CM) - Brainstem stroke (HCC)   THERAPY DIAG:  Unsteadiness on feet  Other abnormalities of gait and mobility  Muscle weakness (generalized)  Hemiplegia and hemiparesis following cerebral infarction affecting left non-dominant side (HCC)  Rationale for Evaluation and Treatment: Rehabilitation  SUBJECTIVE:                                                                                                                                                                                              SUBJECTIVE STATEMENT: The next day after PT the right leg had a lot of pain but that has subsided.  Been bending over  a bunch and probably aggravated  Pt accompanied by: self  PERTINENT HISTORY: Per Dr. Pearlean Brownie office note 12/11/2022:  PMH of esophageal strictures status post dilation, multiple hernias status post multiple surgeries, GERD/PUD, hypertension, hyperlipidemia, autoimmune urticaria, emphysema, sleep apnea who presents to the ED on 10/13/2022 with right upper quadrant and right lower quadrant abdominal pain x 2 days associated nausea.  She was found to have small bowel obstruction and was being managed inpatient at [facility].  Around shift change, patient was noted to have left facial droop, slurring of her speech and upon further questioning, symptoms were first noted at 1700 on 10/14/2022.  Her team had difficulty establishing a last known well.  However, symptoms were first noted at 1700.  A code stroke was activated if she had a CT head without contrast which was notable for a moderately large left PICA territory infarct.  She was eval by teleneurology.  She was not given TNKase due to unclear last known well and the fact that the stroke looks completed.  CT angio head and neck with and without contrast was negative for any large vessel occlusion.  Specifically, no basilar occlusion or thrombosis noted.  CT perfusion with no core or mismatch noted.  Given the moderately large posterior fossa stroke which is in proximity to brainstem, and the extension of the stroke into the brainstem, she was transferred to [facility] medical ICU for immediate in person neurology evaluation and for close monitoring for signs of posterior fossa crowding and potential herniation.  She was monitored carefully in the ICU and remained stable and did not require any neurosurgical intervention.  MRI showed a large acute infarct involving both left PICA and Icar  territory with mass effect on posterior fossa and fourth ventricle mild hydrocephalus.  2D echo showed ejection fraction of 60 to 65%.  Left atrial size was normal.  LDL cholesterol 54 mg percent.  Hemoglobin A1c was 5.6.  Patient had significant left peripheral facial nerve weakness as well as dysphagia and left hemiataxia.  She was seen by physical Occupational Therapy transfer to inpatient rehab where she stayed for a month.  She made gradual improvement in his been home now for nearly 4 weeks.  She still has significant left facial weakness with dryness of the left eye with redness.  She is unable to close her left eye.  She does have an upcoming appointment to see ophthalmologist next week.  She is still not gotten any hearing back in the left ear.  She still has trouble swallowing has to swallow mostly on the right side.  She has a lot of drooling from the left corner of the mouth.  She still has some left-sided incoordination and she is working with therapist has shown some improvement.  She is still unable to walk on her own and requires 1 person assist with gait belt to walk with a wheelchair.  She is tolerating Plavix with minor bruising but no bleeding.  Patient is clearly significantly disabled and is currently on short-term disability and thinking about going on a long disability.  Patient also has a new complaint of neck pain and right arm numbness.  Her primary care physician has ordered an MRI of the C-spine is scheduled for next week for this.  Last CT head on 10/28/2022 had shown decrease mass effect in the posterior fossa with improved patency of the fourth ventricle.    PAIN:  Are you having pain?  Left low back, 6/10  PRECAUTIONS: Fall and Other: vision changes    Had L eye surgery and sees surgeon tomorrow. Cornea specialist next week. Reports double vision if she uncovers the L eye. 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 Pt reports no lifting >gallon of  milk for 2 weeks(pt reports this has been lifted)  RED FLAGS: None   WEIGHT BEARING RESTRICTIONS: No  FALLS: Has patient fallen in last 6 months? No  LIVING ENVIRONMENT: Lives with: lives with their family and daughter comes over to help as husband can't be primary caregiver Lives in: House/apartment Stairs:  6 steps to front of home with L rail ascending Has following equipment at home: Single point cane, Environmental consultant - 2 wheeled, Environmental consultant - 4 wheeled, and Wheelchair (manual)  PLOF: Independent; enjoyed gardening, office work; took care of family  PATIENT GOALS: Pt's goal is to get back to being better to help with my family.  OBJECTIVE:   TODAY'S TREATMENT: 03/13/23 Activity Comments  Seated w/ MHP to back performing LE PRE -LAQ 3x10 5# -hip add iso 3x10 -hip abd 3x10 green loop  Sidestepping x 2 min 5# ankle weights + green loop knees. BUE support  Gait training -level surfaces w/ bilat trekking poles and CGA x 75 ft -outdoor: grass, hill, curb w/ trekking poles + CGA -rollator curb negotiation  Righting reactions: push-release            TODAY'S TREATMENT: 02/28/23 Activity Comments  No focal weakness to RLE via resisted (myotome) tests   Supported prone Tenderness to sciatic notch, discomfort with passive knee flexion (more stretch to quads from her description)--no centralization of symptoms noted  Sciatic nerve glide (supine)   Posterior pelvic tilt 2x10 Cues for form/sequence  Single knee to chest 10 deep breaths  4-5/10 back/leg pain   Balance/pivot turns Cues/guarding to facilitate large amplitude step vs small steps in order to improve coordination and single limb support          PATIENT EDUCATION: Education details: edu on exam findings and progress/remaining deficits towards goals, answered pt's questions about safety with [phone] and how to increase walking endurance; advised to try walking for time inside with 4WW first to test endurance before trying to walk  outside  Person educated: Patient Education method: Explanation Education comprehension: verbalized understanding     HOME EXERCISE PROGRAM Last updated: 01/08/23 Access Code: [email or web address] URL: [email or web address] Date: 01/08/2023 Prepared by: [facility] Outpatient  Re[facility] - [facility]  Exercises - Sit to Stand Without Arm Support  - 1 x daily - 5 x weekly - 2 sets - 10 reps - Heel Toe Raises with Counter Support  - 1 x daily - 5 x weekly - 2 sets - 10 reps - Alternating Step Forward with Support  - 1 x daily - 5 x weekly - 2 sets - 10 reps - Sitting Knee Extension with Resistance  - 1 x daily - 5 x weekly - 2 sets - 10 reps - Seated Hamstring Curl with Anchored Resistance  - 1 x daily - 5 x weekly - 2 sets - 10 reps - Supine Sciatic Nerve Glide  - 1 x daily - 7 x weekly - 3 sets - 10 reps - Supine Posterior Pelvic Tilt  - 1 x daily - 7 x weekly - 3 sets - 10 reps    --------------------------------------------------------------- Note: Objective measures below were completed at Evaluation unless otherwise noted.  DIAGNOSTIC FINDINGS: See above from  CVA; MRI upcoming for RUE numbness and cervical pain  COGNITION: Overall cognitive status: Within functional limits for tasks assessed   SENSATION: Light touch: WFL except for R hand not being able to discern hot/cold  COORDINATION: Slowed alt toe taps, slowed heel to shin (R more than L)  MUSCLE TONE: LLE: Mild  POSTURE: rounded shoulders and forward head Sits in posterior pelvic tilt  LOWER EXTREMITY ROM:   AROM WFL BLEs  LOWER EXTREMITY MMT:    MMT Right Eval Left Eval  Hip flexion 4 4+  Hip extension    Hip abduction 4+ 4+  Hip adduction 4+ 4+  Hip internal rotation    Hip external rotation    Knee flexion 4 4+  Knee extension 4 4+  Ankle dorsiflexion 4+ 4+  Ankle plantarflexion    Ankle inversion 4 4  Ankle eversion 4 4  (Blank rows = not tested)   TRANSFERS: Assistive device  utilized: None  Sit to stand: SBA Stand to sit: SBA  GAIT: Gait pattern: step to pattern, step through pattern, decreased step length- Right, decreased step length- Left, and narrow BOS Distance walked: 35 ft x 2 Assistive device utilized: Walker - 2 wheeled Level of assistance: CGA and Min A Comments: 38.54 sec 20 ft (0.52 ft/sec)  FUNCTIONAL TESTS:  5 times sit to stand: 27.75 sec with Ues; performs sit<>stand x 1 with arms crossed at chest Timed up and go (TUG): 45.94 sec with RW  PATIENT SURVEYS:  FOTO 56; predicted 67  TODAY'S TREATMENT:                                                                                                                              DATE: 12/31/2022    PATIENT EDUCATION: Education details:  Eval results, POC Person educated: Patient Education method: Explanation Education comprehension: verbalized understanding  HOME EXERCISE PROGRAM: Not yet initiated  GOALS: Goals reviewed with patient? Yes  SHORT TERM GOALS: Target date: 02/01/2023  Pt will be independent with HEP for improved strength, balance, functional mobility. Baseline: NT 02/20/23 Goal status: IN PROGRESS  2.  Pt will improve 5x sit<>stand to less than or equal to 20 sec to demonstrate improved functional strength and transfer efficiency. Baseline: 27.75 sec with BUE support; 12.54 sec without UEs and decreased eccentric control 02/20/23 Goal status: MET 02/20/23  3.  Pt will improve TUG score to less than or equal to 35 sec for decreased fall risk. Baseline: 23.42 sec 02/20/23 Goal status: MET 02/20/23  4.  Berg score to improve by 7 points from baseline, for decreased fall risk. Baseline: 37>45 02/20/23 Goal status:MET 02/20/23  5.  Pt will ambulate at least 500 ft outdoor community surfaces, with RW, supervision.  Baseline:  RW with CGA/min; NT 02/20/23  Goals status: IN PROGRESS     LONG TERM GOALS: Target date: 04/03/2023  Pt will be independent with HEP for improved strength,  balance, gait. Baseline:  NT  02/20/23 Goal status: IN PROGRESS  2.  Pt will improve 5x sit<>stand to less than or equal to 12.5 sec to demonstrate improved functional strength and transfer efficiency. Baseline: 12.5 sec 02/20/23 Goal status:MET 02/20/23  3.  Pt will improve TUG score to less than or equal to 20 sec for decreased fall risk. Baseline: 23.42 sec 02/20/23 Goal status: IN PROGRESS 02/20/23  4.  Pt will improve gait velocity to at least 1.3 ft/sec for improved gait efficiency and safety.  Baseline: 1.53 ft/sec 02/20/23 Goal status:MET 02/20/23  5.  Pt will ambulate 200 ft, household and short community distances, mod I, with least restrictive assistive device (cane vs. Walker) Baseline:  NT 02/20/23 Goal status: IN PROGRESS  6.  FOTO score to improve to 67 to demo improved overall functional mobility.  Baseline:  56 NT 02/20/23  Goal status:  IN PROGRESS  7. Patient to score at least 50/56 on Berg in order to decrease falls risk.  Baseline:  45 02/20/23  Goal status:  INITIAL   7. Patient to report ability to access local community such as church and grocery store with LRAD.  Baseline:  reports using w/c outside of house 02/20/23  Goal status:  INITIAL  ASSESSMENT:  CLINICAL IMPRESSION: Initiated with seated strengthening to improve BLE strength/activity tolerance and for moist heat application to back to address back pain.  Progressed to ambulation with use of trekking poles to enable ambulation to uneven surfaces with good tolerance and slow, but steady pattern when ambulating on grass and slight hills w/ deficits during turning requiring CGA for steadying support.  Facilitation of righting reactions via push-release to reduce risk for falls with greatest deficits with retro-LOB and left lateral side with present, but delayed righting response currently non-functional as fall would occur if unassisted.  Continued sessions to advance POC details to improve mobility and reduce risk for falls    OBJECTIVE IMPAIRMENTS: Abnormal gait, decreased balance, decreased mobility, difficulty walking, decreased strength, impaired tone, impaired vision/preception, and postural dysfunction.   ACTIVITY LIMITATIONS: carrying, lifting, standing, squatting, stairs, transfers, bathing, toileting, dressing, reach over head, hygiene/grooming, locomotion level, and caring for others  PARTICIPATION LIMITATIONS: meal prep, cleaning, laundry, interpersonal relationship, driving, shopping, community activity, occupation, and church  PERSONAL FACTORS: 3+ comorbidities: See PMH above  are also affecting patient's functional outcome.   REHAB POTENTIAL: Good  CLINICAL DECISION MAKING: Evolving/moderate complexity  EVALUATION COMPLEXITY: Moderate  PLAN:  PT FREQUENCY: 1x/week for 6 weeks  PT DURATION: other: -  PLANNED INTERVENTIONS: 97110-Therapeutic exercises, 97530- Therapeutic activity, [id number]- Neuromuscular re-education, 97535- Self Care, 16109- Manual therapy, [phone]- Gait training, [phone]- Orthotic Fit/training, [phone]- Aquatic Therapy, Patient/Family education, Balance training, Stair training, DME instructions, and Wheelchair mobility training  PLAN FOR NEXT SESSION: review and update HEP to reflect current abilities, progress narrow BOS balance, balance with turns, trial cane? Work on walking endurance and improving balance confidence     11:39 AM, 03/13/23 M. Shary Decamp, PT, DPT Physical Therapist- [place] Office Number: [phone]

## 2023-03-14 ENCOUNTER — Ambulatory Visit (HOSPITAL_COMMUNITY)
Admission: RE | Admit: 2023-03-14 | Discharge: 2023-03-14 | Disposition: A | Discharge status: Home / Self Care | Payer: Medicaid Other | Source: Ambulatory Visit | Attending: Neurology | Admitting: Neurology

## 2023-03-14 DIAGNOSIS — R1310 Dysphagia, unspecified: Secondary | ICD-10-CM

## 2023-03-14 DIAGNOSIS — R471 Dysarthria and anarthria: Secondary | ICD-10-CM | POA: Diagnosis not present

## 2023-03-14 DIAGNOSIS — R1312 Dysphagia, oropharyngeal phase: Secondary | ICD-10-CM | POA: Insufficient documentation

## 2023-03-14 NOTE — Progress Notes (Signed)
 Modified Barium Swallow Study  Patient Details  Name: Joan Robinson MRN: [id number] Date of Birth: 21-Aug-1960  Today's Date: 03/14/2023  Modified Barium Swallow completed.  Full report located under Chart Review in the Imaging Section.  History of Present Illness Pt is a 63 yo F who presented to acute care hospital Sept 2024, demonstrating stroke L posteroinferior cerebellar artery territory. PMhx: HTN, HLD, peptic ulcer disease, diverticulitis, bipolar disorder, hypothyroidism, GERD, Hashimoto's disease. Hemicolectomy completed on 10/1. Prior MBSS completed 11/05/2022 demonstrating moderate oral and pharyngeal dysphagia, diet rx dysphagia 2 with thin liquids. Currently followed in OP setting for ST addressing dysarthria and dysphagia.   Clinical Impression The pt presents with a predominantly oral dysphagia. Oral pocketing and oral residue observed likely related to reduced sensory sensation and motoric innervation on R. Pt demonstrated timely, but inefficient chewing of bolus and difficulty clearing oral residue from palate, tongue and buccal cavity. Pt also displayed delayed transport of the bolus and delayed initiation of the swallow at the level of the pyriform sinuses. Pt's hyoid excursion and laryngeal elevation were sufficient, however SLP observed delayed inversion of the epiglottis allowing rare trace and transient penetration of thin liquid. Some reduced tongue base retraction noted results in trace residuals. Pharyngeal residue remained on pharyngeal structures including the vallecula, tongue base and pharyngeal wall. Residue in the vallecula cleared with prompt for an additional dry swallow. Pt effectively swallowed half pill with thin liquids, demonstrating good control and adequate airway protection for mixed consistency. Esophageal retention observed requiring an additional bolus of thin liquids, which was effective to clear. SLP recommends pt proceed with oral diet, with consideration  for oral abilities. Pt may do better orally with moistened, bite sized solids, taking small, manageable bites, checking for oral holding/pocketing and any anterior spillage, and taking multiple dry swallows after sips to clear leftover residue. Recommendations communicated to pt and spouse.  Pt to follow up with primary SLP in OP clinic.  Factors that may increase risk of adverse event in presence of aspiration [name] & [name] 2021): Limited mobility;Inadequate oral hygiene (Reduced facial sensation)  Swallow Evaluation Recommendations Recommendations: PO diet Liquid Administration via: Straw Medication Administration:  (Medication cut in half, then swallowed with thin liquid) Supervision: Patient able to self-feed Swallowing strategies  : Small bites/sips;Check for pocketing or oral holding;Check for anterior loss;Multiple dry swallows after each bite/sip;Minimize environmental distractions Postural changes: Position pt fully upright for meals;Stay upright 30-60 min after meals Oral care recommendations: Oral care BID (2x/day)      Maia Breslow 03/14/2023,1:22 PM

## 2023-03-18 ENCOUNTER — Ambulatory Visit: Payer: BC Managed Care – PPO | Admitting: Neurology

## 2023-03-19 ENCOUNTER — Ambulatory Visit: Payer: Commercial Managed Care - HMO | Attending: Neurology

## 2023-03-19 ENCOUNTER — Ambulatory Visit: Payer: Commercial Managed Care - HMO

## 2023-03-19 ENCOUNTER — Ambulatory Visit: Payer: Commercial Managed Care - HMO | Admitting: Occupational Therapy

## 2023-03-19 DIAGNOSIS — M6281 Muscle weakness (generalized): Secondary | ICD-10-CM

## 2023-03-19 DIAGNOSIS — R2681 Unsteadiness on feet: Secondary | ICD-10-CM | POA: Insufficient documentation

## 2023-03-19 DIAGNOSIS — R41842 Visuospatial deficit: Secondary | ICD-10-CM | POA: Insufficient documentation

## 2023-03-19 DIAGNOSIS — I69354 Hemiplegia and hemiparesis following cerebral infarction affecting left non-dominant side: Secondary | ICD-10-CM

## 2023-03-19 DIAGNOSIS — R208 Other disturbances of skin sensation: Secondary | ICD-10-CM

## 2023-03-19 DIAGNOSIS — R471 Dysarthria and anarthria: Secondary | ICD-10-CM | POA: Insufficient documentation

## 2023-03-19 DIAGNOSIS — R2689 Other abnormalities of gait and mobility: Secondary | ICD-10-CM | POA: Insufficient documentation

## 2023-03-19 DIAGNOSIS — R278 Other lack of coordination: Secondary | ICD-10-CM | POA: Insufficient documentation

## 2023-03-19 DIAGNOSIS — R1312 Dysphagia, oropharyngeal phase: Secondary | ICD-10-CM | POA: Insufficient documentation

## 2023-03-19 NOTE — Therapy (Signed)
 OUTPATIENT SPEECH LANGUAGE PATHOLOGY TREATMENT   Patient Name: Joan Robinson MRN: [id number] DOB:Dec 06, 1960, 63 y.o., female Today's Date: 03/19/2023  PCP: Kathlen Brunswick, MD REFERRING PROVIDER:  Micki Riley, MD     END OF SESSION:  End of Session - 03/19/23 0936     Visit Number 4    Number of Visits 17    Date for SLP Re-Evaluation 04/19/23    SLP Start Time 0935    SLP Stop Time  1015    SLP Time Calculation (min) 40 min    Activity Tolerance Patient tolerated treatment well              Past Medical History:  Diagnosis Date   Allergic rhinitis    Anemia 10/10/2011   Anxiety and depression 01/17/2007   Qualifier: Diagnosis of  By: Everardo All MD, Sean A    Arthritis 07/24/2013   Likely inflammatory and following with Dr Maryln Gottron of [facility]  rheumatology   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   BCC (basal cell carcinoma of skin) 06/01/2012   Leg Follows with Dr Margo Aye   Bipolar disorder (HCC) 01/17/2007   Qualifier: Diagnosis of  By: Everardo All MD, Sean A    Cataract    Diverticulosis    Diverticulosis    Emphysema of lung (HCC)    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   Gallstones    GERD (gastroesophageal reflux disease)    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   Hashimoto's disease    Hyperlipidemia    Hypertension    Hypothyroidism 08/24/2006   Qualifier: Diagnosis of  By: Everardo All MD, Sean A     IBS (irritable bowel syndrome) 07/27/2016   Obesity 11/01/2013   PUD (peptic ulcer disease)    Sleep apnea 04/27/2016   Tobacco abuse    Past Surgical History:  Procedure Laterality Date   ABDOMINAL HYSTERECTOMY     ABDOMINAL HYSTERECTOMY  01/15/2009   complete   COLONOSCOPY     DILATION AND CURETTAGE OF UTERUS  01/16/1983   EYE SURGERY  03/03/2014   Surgery on both eyes for epiretinal membrane (vitreous peel)   Gated Spect wall motion stress cardiolite  11/05/2001   HIATAL HERNIA REPAIR N/A 07/12/2021   Procedure: LAPAROSCOPY W/ EXTENSIVE FOREGUT DISSECTION; PARTIAL  STOMACH REDUCTION; GASTROSTOMY TUBE PLACEMENT; GASTROPEXY;  Surgeon: Luretha Murphy, MD;  Location: [facility];  Service: General;  Laterality: N/A;   LAPAROSCOPIC RIGHT HEMI COLECTOMY Right 10/16/2022   Procedure: LAPAROSCOPIC ASSISTED RIGHT HEMI COLECTOMY;  Surgeon: Andria Meuse, MD;  Location: [facility];  Service: General;  Laterality: Right;   POLYPECTOMY     TUBAL LIGATION  01/15/1993   UTERINE SUSPENSION     mesh   VITRECTOMY Bilateral 03/03/2014   XI ROBOTIC ASSISTED HIATAL HERNIA REPAIR N/A 05/01/2021   Procedure: XI ROBOTIC ASSISTED TYPE III HIATAL HERNIA REPAIR WITH FUNDOPLICATION;  Surgeon: Luretha Murphy, MD;  Location: [facility];  Service: General;  Laterality: N/A;   Patient Active Problem List   Diagnosis Date Noted   Dysphagia 11/05/2022   Vestibular dizziness 11/05/2022   Malnutrition of moderate degree 11/01/2022   Acute stroke due to ischemia [facility]) 10/25/2022   CVA (cerebral vascular accident) (HCC) 10/25/2022   Protein-calorie malnutrition, severe 10/19/2022   Diverticulosis 10/14/2022   History of emphysema (HCC) 10/14/2022   SBO (small bowel obstruction) (HCC) 10/13/2022   Aortic valve calcification 09/20/2022   Abdominal aortic atherosclerosis (HCC) 09/20/2022   Hiatal hernia 07/12/2021   Status post laparoscopic Nissen fundoplication 05/01/2021   IBS (  irritable bowel syndrome) 07/27/2016   High-tone pelvic floor dysfunction 05/09/2016   Midline cystocele 05/09/2016   Vaginal atrophy 05/09/2016   Bladder prolapse, female, acquired 05/07/2016   Dystrophic nail 04/29/2016   Fatigue 04/27/2016   Sleep apnea 04/27/2016   UTI (urinary tract infection) 09/11/2015   Cataract cortical, senile, left 12/20/2014   Rectal lesion 10/10/2014   Other type of migraine without status migrainosus 05/06/2014   Dyspepsia 05/06/2014   Postmenopausal estrogen deficiency 05/06/2014   Screening for breast cancer 05/06/2014   Bilateral low-tension glaucoma, indeterminate stage  12/29/2013   High myopia, bilateral 12/29/2013   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   Obesity 11/01/2013   Eustachian tube dysfunction 10/08/2013   Otalgia of left ear 10/08/2013   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   Arthritis 07/24/2013   Anxiety attack 03/08/2013   Paresthesia 12/22/2012   Headache 08/17/2012   BCC (basal cell carcinoma of skin) 06/01/2012   Left arm pain 05/10/2012   Right knee pain 05/10/2012   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   ADD (attention deficit disorder) 04/13/2012   Perimenopausal 01/16/2012   Macular pucker of both eyes 10/18/2011   Vitreous degeneration 10/18/2011   Chronic anemia 10/10/2011   Preventative health care 10/10/2011   Urticaria, chronic 10/10/2011   Macular pucker, bilateral 10/10/2011   Lower back pain    DIVERTICULITIS-COLON 07/25/2009   Diverticulitis of colon 07/25/2009   Hyperlipidemia 06/09/2009   Essential hypertension 06/09/2009   History of esophageal stricture 09/06/2008   GERD (gastroesophageal reflux disease) 07/27/2008   Bipolar disorder (HCC) 01/17/2007   Hypothyroidism 08/24/2006   Allergic rhinitis 08/24/2006   URINARY INCONTINENCE 08/24/2006      ONSET DATE: September 2024   REFERRING DIAG:  I63.9 (ICD-10-CM) - Brainstem stroke (HCC)    THERAPY DIAG:  Dysphagia, oropharyngeal phase  Dysarthria and anarthria  Rationale for Evaluation and Treatment: Rehabilitation  SUBJECTIVE:   SUBJECTIVE STATEMENT: "It'll be 5 months (since CVA) tomorrow."  Pt accompanied by: self  PERTINENT HISTORY: 63 yo admitted 9/28 with RUQ pain with SBO. 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 PMhx: HTN, HLD, peptic ulcer disease, diverticulitis, bipolar disorder, hypothyroidism, GERD, Hashimoto's disease. Hemicolectomy completed on 10/1.  PAIN:  Are you having pain? RUE pain - see OT eval    FALLS: Has  patient fallen in last 6 months?  No  PATIENT GOALS: Improve speech and swallowing skills  OBJECTIVE:  Note: Objective measures were completed at Evaluation unless otherwise noted.  DIAGNOSTIC FINDINGS:  MRI 10/15/22 IMPRESSION: Large acute infarct (affecting both the left PICA and left AICA vascular territories) as described. Posterior fossa mass effect with effacement of the fourth ventricle inferiorly. No evidence of obstructive hydrocephalus at this time. Mild inferior displacement of the cerebellar tonsils (which extend up to 4 mm below the level of the foramen magnum). No more than mild mass effect upon the medulla at this time. Close imaging follow-up is recommended to monitor the evolving posterior fossa mass effect.   RECOMMENDATIONS FROM OBJECTIVE SWALLOW STUDY (MBSS/FEES):   Swallowing Evaluation Impression (03/14/23)  The pt presents with a predominantly oral dysphagia. Oral pocketing and oral residue observed likely related to reduced sensory sensation and motoric innervation on R. Pt demonstrated timely, but inefficient chewing of bolus and difficulty clearing oral residue from palate, tongue and buccal cavity. Pt also displayed delayed transport of the bolus and delayed initiation of the swallow at the level of the  pyriform sinuses. Pt's hyoid excursion and laryngeal elevation were sufficient, however SLP observed delayed inversion of the epiglottis allowing rare trace and transient penetration of thin liquid. Some reduced tongue base retraction noted results in trace residuals. Pharyngeal residue remained on pharyngeal structures including the vallecula, tongue base and pharyngeal wall. Residue in the vallecula cleared with prompt for an additional dry swallow. Pt effectively swallowed half pill with thin liquids, demonstrating good control and adequate airway protection for mixed consistency. Esophageal retention observed requiring an additional bolus of thin liquids, which was  effective to clear. SLP recommends pt proceed with oral diet, with consideration for oral abilities. Pt may do better orally with moistened, bite sized solids, taking small, manageable bites, checking for oral holding/pocketing and any anterior spillage, and taking multiple dry swallows after sips to clear leftover residue. Recommendations communicated to pt and spouse.  Pt to follow up with primary SLP in OP clinic. Swallow Evaluation Recommendations Recommendations: PO diet Liquid Administration via: Straw Medication Administration:  (Medication cut in half, then swallowed with thin liquid) Supervision: Patient able to self-feed Swallowing strategies  : Small bites/sips;Check for pocketing or oral holding;Check for anterior loss;Multiple dry swallows after each bite/sip;Minimize environmental distractions Postural changes: Position pt fully upright for meals;Stay upright 30-60 min after meals Oral care recommendations: Oral care BID (2x/day)  Swallowing Evaluation Recommendations (11/05/22) Dysphagia 2 (Finely chopped); Thin liquids (Level 0) Liquid Administration via: Cup; Spoon; Straw Medication Administration: Crushed with puree Supervision: Full supervision/cueing for swallowing strategies; Patient able to self-feed Objective swallow impairments: Clinical Impression: Pt presents with moderate oral and mild pharyngeal dysphagia. Oral phase characterized by reduced labial seal (resulting in anterior bolus escape beyond the mid chin with thin liquids from teaspoon), impaired/slowed mastication, slowed AP transit, and mild-moderate oral residue after the initial swallow. Patient sensate to residue and works to clear independently. Pharyngeally, deficits characterized by reduced pharyngeal constriction and reduced BOT retraction to the posterior pharyngeal wall resulting in trace-mild residue in the vallecula with all trialed consistencies. Both orally and pharyngeally, amount of residue increases  alongside bolus viscosity. Patient continues to present with trismus that makes oral acceptance of larger bites of food difficult, though this is improving. No significant penetration/aspiration observed throughout all trials. Straw bolus acceptance minimized anterior bolus loss and adding an additional dry swallow after solids mitigated residue in the vallecula. Recommend diet upgrade to Dys2/thin liquid diet, adding additional swallow intermittently to clear residue. Family updated to results and in agreement with plan. Objective recommended compensations:  Swallowing strategies: Slow rate; Check for pocketing or oral holding; Check for anterior loss; Multiple dry swallows after each bite/sip Postural changes: Position pt fully upright for meals; Stay upright 30-60 min after meals Oral care recommendations: Oral care BID (2x/day)   PATIENT REPORTED OUTCOME MEASURES (PROM): EAT-10:   and Speech - QOL completed this session. EAT-10 was 21/40 with higher scores  indicating worse effect of swallow on QOL. Speech QOL score was 25/30 with higher scores indicating worse effect of speech on pt's QOL.    TODAY'S TREATMENT:  DATE:  03/19/23: Pt with MBS approx one week ago, results above. SLP interpreted these results for pt. SLP engaged pt with assistance in following her precautions with fig bar and water - req'd initial mod A for multiple swallows and for proper size bites however was independent upon session completion. Pt with lt labial leakage with cup sips x1/7.  02/20/23: Pt has progressed to soft foods - she is finding what foods don't work more by trial and error. Crumbly foods and vinegar sauces cause pt to cough at this time. MBS scheduled for later this month due to pt losing her insurance and when obtained that was earliest available. Today SLP assessed pt with fig  bar and water with straw. Pt req'd occasional min A for recall of sip of liqiud after solids. She tipped her bottle/straw upwards and had multiple sips and coughed. SLP suggested single sips with head to side instead of tilted back. SLP provided pt with dysphagia exercises (see pt instructions) and pt was independent by session end.   01/08/23: PROMs completed today. SLP addressed two more trismus exercises with pt today. She req'd min-mod A rarely, faded to modified independence. SLP and pt discussed rationale for swallow assessment (MBS) and pt agreed she needed this assessment completed.   12/31/22 (eval): SLP educated/reviewed three labial exercises with pt and provided handout. SLP role in rehab process was explained. Reviewed pt's swallow strategies with her.  PATIENT EDUCATION: Education details: see "today's treatment" Person educated: Patient Education method: Explanation, Demonstration, Verbal cues, and Handouts Education comprehension: verbalized understanding, returned demonstration, verbal cues required, and needs further education   GOALS: Goals reviewed with patient? No  SHORT TERM GOALS: Target date: 02/01/23, 03/19/23, 03/29/23 (due to limited visits)  Pt will perform dysarthria HEP, and dysphagia (if necessary) HEP with rare min A in two sessions Baseline: Goal status: INITIAL  2.  Pt will perform MBS to assess current swallow status Baseline:  Goal status: met  3.  Pt will follow swallow precautions from MBS with POs with rare min A in two sessions Baseline:  Goal status: INITIAL  4.  Pt will improve articulation in 10 minutes conversation with compensatory strategies with rare min A Baseline:  Goal status: INITIAL   LONG TERM GOALS: Target date: 03/01/23, 04/19/23, 05/03/23 (due to extended STG date)  Pt will improve PROM compared to initial administration Baseline:  Goal status: INITIAL  2.  Pt will improve articulation in 10 minutes conversation using  compensatory strategies Baseline:  Goal status: INITIAL  3.  Pt will perform dysarthria HEP, and dysphagia (if necessary) HEP with modified independence in three sessions Baseline:  Goal status: INITIAL  4.  Pt will follow swallow precautions from MBS with POs with modified independence in 3 sessions Baseline:  Goal status: INITIAL   ASSESSMENT:  CLINICAL IMPRESSION: RECERT TODAY. Patient is a 63 y.o. F who was seen for swallowing and speech after a CVA in October 2024. Pt had follow up MBS last week. Pt is drinking with a straw and reports less lt labial sulcus residue than last session.  OBJECTIVE IMPAIRMENTS: include dysarthria and dysphagia. These impairments are limiting patient from ADLs/IADLs, effectively communicating at home and in community, and safety when swallowing. Factors affecting potential to achieve goals and functional outcome are severity of impairments and unknown prognosis for nerve recovery on lt side . Patient will benefit from skilled SLP services to address above impairments and improve overall function.  REHAB POTENTIAL: Good  PLAN:  SLP  FREQUENCY: 2x/week  SLP DURATION: 8 weeks  PLANNED INTERVENTIONS: 92526 Treatment of swallowing function, 92507 Treatment of speech (30 or 45 min) , Re-evaluation, Aspiration precaution training, Pharyngeal strengthening exercises, Diet toleration management , Environmental controls, Trials of upgraded texture/liquids, Oral motor exercises, Functional tasks, Multimodal communication approach, SLP instruction and feedback, Compensatory strategies, Patient/family education, and MBS    Rosalinda Seaman, CCC-SLP 03/19/2023, 11:53 PM

## 2023-03-19 NOTE — Therapy (Signed)
 OUTPATIENT PHYSICAL THERAPY NEURO TREATMENT   Patient Name: Joan Robinson MRN: [id number] DOB:November 07, 1960, 63 y.o., female Today's Date: 03/19/2023   PCP: April Manson, NP REFERRING PROVIDER: Micki Riley, MD   END OF SESSION:  PT End of Session - 03/19/23 1015     Visit Number 6    Number of Visits 9    Date for PT Re-Evaluation 04/03/23    Authorization Type [employer]    Authorization - Visit Number 4    Authorization - Number of Visits 10   30 combined   PT Start Time 1015    PT Stop Time 1100    PT Time Calculation (min) 45 min    Equipment Utilized During Treatment Gait belt    Activity Tolerance Patient tolerated treatment well    Behavior During Therapy WFL for tasks assessed/performed               Past Medical History:  Diagnosis Date   Allergic rhinitis    Anemia 10/10/2011   Anxiety and depression 01/17/2007   Qualifier: Diagnosis of  By: Everardo All MD, Sean A    Arthritis 07/24/2013   Likely inflammatory and following with Dr Maryln Gottron of [facility]  rheumatology   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   BCC (basal cell carcinoma of skin) 06/01/2012   Leg Follows with Dr Margo Aye   Bipolar disorder (HCC) 01/17/2007   Qualifier: Diagnosis of  By: Everardo All MD, Sean A    Cataract    Diverticulosis    Diverticulosis    Emphysema of lung (HCC)    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   Gallstones    GERD (gastroesophageal reflux disease)    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   Hashimoto's disease    Hyperlipidemia    Hypertension    Hypothyroidism 08/24/2006   Qualifier: Diagnosis of  By: Everardo All MD, Sean A     IBS (irritable bowel syndrome) 07/27/2016   Obesity 11/01/2013   PUD (peptic ulcer disease)    Sleep apnea 04/27/2016   Tobacco abuse    Past Surgical History:  Procedure Laterality Date   ABDOMINAL HYSTERECTOMY     ABDOMINAL HYSTERECTOMY  01/15/2009   complete   COLONOSCOPY     DILATION AND CURETTAGE OF UTERUS  01/16/1983   EYE SURGERY  03/03/2014    Surgery on both eyes for epiretinal membrane (vitreous peel)   Gated Spect wall motion stress cardiolite  11/05/2001   HIATAL HERNIA REPAIR N/A 07/12/2021   Procedure: LAPAROSCOPY W/ EXTENSIVE FOREGUT DISSECTION; PARTIAL STOMACH REDUCTION; GASTROSTOMY TUBE PLACEMENT; GASTROPEXY;  Surgeon: Luretha Murphy, MD;  Location: [facility];  Service: General;  Laterality: N/A;   LAPAROSCOPIC RIGHT HEMI COLECTOMY Right 10/16/2022   Procedure: LAPAROSCOPIC ASSISTED RIGHT HEMI COLECTOMY;  Surgeon: Andria Meuse, MD;  Location: [facility];  Service: General;  Laterality: Right;   POLYPECTOMY     TUBAL LIGATION  01/15/1993   UTERINE SUSPENSION     mesh   VITRECTOMY Bilateral 03/03/2014   XI ROBOTIC ASSISTED HIATAL HERNIA REPAIR N/A 05/01/2021   Procedure: XI ROBOTIC ASSISTED TYPE III HIATAL HERNIA REPAIR WITH FUNDOPLICATION;  Surgeon: Luretha Murphy, MD;  Location: [facility];  Service: General;  Laterality: N/A;   Patient Active Problem List   Diagnosis Date Noted   Dysphagia 11/05/2022   Vestibular dizziness 11/05/2022   Malnutrition of moderate degree 11/01/2022   Acute stroke due to ischemia [facility]) 10/25/2022   CVA (cerebral vascular accident) (HCC) 10/25/2022   Protein-calorie malnutrition, severe 10/19/2022   Diverticulosis  10/14/2022   History of emphysema (HCC) 10/14/2022   SBO (small bowel obstruction) (HCC) 10/13/2022   Aortic valve calcification 09/20/2022   Abdominal aortic atherosclerosis (HCC) 09/20/2022   Hiatal hernia 07/12/2021   Status post laparoscopic Nissen fundoplication 05/01/2021   IBS (irritable bowel syndrome) 07/27/2016   High-tone pelvic floor dysfunction 05/09/2016   Midline cystocele 05/09/2016   Vaginal atrophy 05/09/2016   Bladder prolapse, female, acquired 05/07/2016   Dystrophic nail 04/29/2016   Fatigue 04/27/2016   Sleep apnea 04/27/2016   UTI (urinary tract infection) 09/11/2015   Cataract cortical, senile, left 12/20/2014   Rectal lesion 10/10/2014   Other type  of migraine without status migrainosus 05/06/2014   Dyspepsia 05/06/2014   Postmenopausal estrogen deficiency 05/06/2014   Screening for breast cancer 05/06/2014   Bilateral low-tension glaucoma, indeterminate stage 12/29/2013   High myopia, bilateral 12/29/2013   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   Obesity 11/01/2013   Eustachian tube dysfunction 10/08/2013   Otalgia of left ear 10/08/2013   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   Arthritis 07/24/2013   Anxiety attack 03/08/2013   Paresthesia 12/22/2012   Headache 08/17/2012   BCC (basal cell carcinoma of skin) 06/01/2012   Left arm pain 05/10/2012   Right knee pain 05/10/2012   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   ADD (attention deficit disorder) 04/13/2012   Perimenopausal 01/16/2012   Macular pucker of both eyes 10/18/2011   Vitreous degeneration 10/18/2011   Chronic anemia 10/10/2011   Preventative health care 10/10/2011   Urticaria, chronic 10/10/2011   Macular pucker, bilateral 10/10/2011   Lower back pain    DIVERTICULITIS-COLON 07/25/2009   Diverticulitis of colon 07/25/2009   Hyperlipidemia 06/09/2009   Essential hypertension 06/09/2009   History of esophageal stricture 09/06/2008   GERD (gastroesophageal reflux disease) 07/27/2008   Bipolar disorder (HCC) 01/17/2007   Hypothyroidism 08/24/2006   Allergic rhinitis 08/24/2006   URINARY INCONTINENCE 08/24/2006    ONSET DATE: 10/13/2022  REFERRING DIAG: I63.9 (ICD-10-CM) - Brainstem stroke (HCC)   THERAPY DIAG:  Unsteadiness on feet  Other abnormalities of gait and mobility  Muscle weakness (generalized)  Hemiplegia and hemiparesis following cerebral infarction affecting left non-dominant side (HCC)  Rationale for Evaluation and Treatment: Rehabilitation  SUBJECTIVE:                                                                                                                                                                                              SUBJECTIVE STATEMENT: Back is feeling better, no radiating LE pain at this time  Pt accompanied by: self  PERTINENT HISTORY: Per Dr.  Sethi office note 12/11/2022:  PMH of esophageal strictures status post dilation, multiple hernias status post multiple surgeries, GERD/PUD, hypertension, hyperlipidemia, autoimmune urticaria, emphysema, sleep apnea who presents to the ED on 10/13/2022 with right upper quadrant and right lower quadrant abdominal pain x 2 days associated nausea.  She was found to have small bowel obstruction and was being managed inpatient at [facility].  Around shift change, patient was noted to have left facial droop, slurring of her speech and upon further questioning, symptoms were first noted at 1700 on 10/14/2022.  Her team had difficulty establishing a last known well.  However, symptoms were first noted at 1700.  A code stroke was activated if she had a CT head without contrast which was notable for a moderately large left PICA territory infarct.  She was eval by teleneurology.  She was not given TNKase due to unclear last known well and the fact that the stroke looks completed.  CT angio head and neck with and without contrast was negative for any large vessel occlusion.  Specifically, no basilar occlusion or thrombosis noted.  CT perfusion with no core or mismatch noted.  Given the moderately large posterior fossa stroke which is in proximity to brainstem, and the extension of the stroke into the brainstem, she was transferred to [facility] medical ICU for immediate in person neurology evaluation and for close monitoring for signs of posterior fossa crowding and potential herniation.  She was monitored carefully in the ICU and remained stable and did not require any neurosurgical intervention.  MRI showed a large acute infarct involving both left PICA and Icar territory with mass effect on posterior fossa and fourth ventricle mild  hydrocephalus.  2D echo showed ejection fraction of 60 to 65%.  Left atrial size was normal.  LDL cholesterol 54 mg percent.  Hemoglobin A1c was 5.6.  Patient had significant left peripheral facial nerve weakness as well as dysphagia and left hemiataxia.  She was seen by physical Occupational Therapy transfer to inpatient rehab where she stayed for a month.  She made gradual improvement in his been home now for nearly 4 weeks.  She still has significant left facial weakness with dryness of the left eye with redness.  She is unable to close her left eye.  She does have an upcoming appointment to see ophthalmologist next week.  She is still not gotten any hearing back in the left ear.  She still has trouble swallowing has to swallow mostly on the right side.  She has a lot of drooling from the left corner of the mouth.  She still has some left-sided incoordination and she is working with therapist has shown some improvement.  She is still unable to walk on her own and requires 1 person assist with gait belt to walk with a wheelchair.  She is tolerating Plavix with minor bruising but no bleeding.  Patient is clearly significantly disabled and is currently on short-term disability and thinking about going on a long disability.  Patient also has a new complaint of neck pain and right arm numbness.  Her primary care physician has ordered an MRI of the C-spine is scheduled for next week for this.  Last CT head on 10/28/2022 had shown decrease mass effect in the posterior fossa with improved patency of the fourth ventricle.    PAIN:  Are you having pain?  Left low back, 4/10  PRECAUTIONS: Fall and Other: vision changes    Had L eye surgery and sees  surgeon tomorrow. Cornea specialist next week. Reports double vision if she uncovers the L eye. 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 Pt reports no lifting >gallon of milk for 2 weeks(pt reports this has been lifted)  RED  FLAGS: None   WEIGHT BEARING RESTRICTIONS: No  FALLS: Has patient fallen in last 6 months? No  LIVING ENVIRONMENT: Lives with: lives with their family and daughter comes over to help as husband can't be primary caregiver Lives in: House/apartment Stairs:  6 steps to front of home with L rail ascending Has following equipment at home: Single point cane, Environmental consultant - 2 wheeled, Environmental consultant - 4 wheeled, and Wheelchair (manual)  PLOF: Independent; enjoyed gardening, office work; took care of family  PATIENT GOALS: Pt's goal is to get back to being better to help with my family.  OBJECTIVE:   TODAY'S TREATMENT: 03/19/23 Activity Comments  LAQ 3x10 10#  Standing alt stair taps 3x10 10#, 8" step  Sidestepping x 2 min 10# along counter  Standing hamstring curls 3x10 10#  retrowalking CGA-dragging 35# on sheet  Resisted walking -[pushing 35# weighted chair         PATIENT EDUCATION: Education details: edu on exam findings and progress/remaining deficits towards goals, answered pt's questions about safety with [phone] and how to increase walking endurance; advised to try walking for time inside with 4WW first to test endurance before trying to walk outside  Person educated: Patient Education method: Explanation Education comprehension: verbalized understanding     HOME EXERCISE PROGRAM Last updated: 01/08/23 Access Code: [email or web address] URL: [email or web address] Date: 01/08/2023 Prepared by: [facility] - Outpatient  Rehab - [facility]  Exercises - Sit to Stand Without Arm Support  - 1 x daily - 5 x weekly - 2 sets - 10 reps - Heel Toe Raises with Counter Support  - 1 x daily - 5 x weekly - 2 sets - 10 reps - Alternating Step Forward with Support  - 1 x daily - 5 x weekly - 2 sets - 10 reps - Sitting Knee Extension with Resistance  - 1 x daily - 5 x weekly - 2 sets - 10 reps - Seated Hamstring Curl with Anchored Resistance  - 1 x daily - 5 x weekly - 2 sets - 10 reps - Supine  Sciatic Nerve Glide  - 1 x daily - 7 x weekly - 3 sets - 10 reps - Supine Posterior Pelvic Tilt  - 1 x daily - 7 x weekly - 3 sets - 10 reps    --------------------------------------------------------------- Note: Objective measures below were completed at Evaluation unless otherwise noted.  DIAGNOSTIC FINDINGS: See above from CVA; MRI upcoming for RUE numbness and cervical pain  COGNITION: Overall cognitive status: Within functional limits for tasks assessed   SENSATION: Light touch: WFL except for R hand not being able to discern hot/cold  COORDINATION: Slowed alt toe taps, slowed heel to shin (R more than L)  MUSCLE TONE: LLE: Mild  POSTURE: rounded shoulders and forward head Sits in posterior pelvic tilt  LOWER EXTREMITY ROM:   AROM WFL BLEs  LOWER EXTREMITY MMT:    MMT Right Eval Left Eval  Hip flexion 4 4+  Hip extension    Hip abduction 4+ 4+  Hip adduction 4+ 4+  Hip internal rotation    Hip external rotation    Knee flexion 4 4+  Knee extension 4 4+  Ankle dorsiflexion 4+ 4+  Ankle plantarflexion  Ankle inversion 4 4  Ankle eversion 4 4  (Blank rows = not tested)   TRANSFERS: Assistive device utilized: None  Sit to stand: SBA Stand to sit: SBA  GAIT: Gait pattern: step to pattern, step through pattern, decreased step length- Right, decreased step length- Left, and narrow BOS Distance walked: 35 ft x 2 Assistive device utilized: Walker - 2 wheeled Level of assistance: CGA and Min A Comments: 38.54 sec 20 ft (0.52 ft/sec)  FUNCTIONAL TESTS:  5 times sit to stand: 27.75 sec with Ues; performs sit<>stand x 1 with arms crossed at chest Timed up and go (TUG): 45.94 sec with RW  PATIENT SURVEYS:  FOTO 56; predicted 67  TODAY'S TREATMENT:                                                                                                                              DATE: 12/31/2022    PATIENT EDUCATION: Education details:  Eval results, POC Person  educated: Patient Education method: Explanation Education comprehension: verbalized understanding  HOME EXERCISE PROGRAM: Not yet initiated  GOALS: Goals reviewed with patient? Yes  SHORT TERM GOALS: Target date: 02/01/2023  Pt will be independent with HEP for improved strength, balance, functional mobility. Baseline: NT 02/20/23 Goal status: IN PROGRESS  2.  Pt will improve 5x sit<>stand to less than or equal to 20 sec to demonstrate improved functional strength and transfer efficiency. Baseline: 27.75 sec with BUE support; 12.54 sec without UEs and decreased eccentric control 02/20/23 Goal status: MET 02/20/23  3.  Pt will improve TUG score to less than or equal to 35 sec for decreased fall risk. Baseline: 23.42 sec 02/20/23 Goal status: MET 02/20/23  4.  Berg score to improve by 7 points from baseline, for decreased fall risk. Baseline: 37>45 02/20/23 Goal status:MET 02/20/23  5.  Pt will ambulate at least 500 ft outdoor community surfaces, with RW, supervision.  Baseline:  RW with CGA/min; NT 02/20/23  Goals status: IN PROGRESS     LONG TERM GOALS: Target date: 04/03/2023  Pt will be independent with HEP for improved strength, balance, gait. Baseline:  NT 02/20/23 Goal status: IN PROGRESS  2.  Pt will improve 5x sit<>stand to less than or equal to 12.5 sec to demonstrate improved functional strength and transfer efficiency. Baseline: 12.5 sec 02/20/23 Goal status:MET 02/20/23  3.  Pt will improve TUG score to less than or equal to 20 sec for decreased fall risk. Baseline: 23.42 sec 02/20/23 Goal status: IN PROGRESS 02/20/23  4.  Pt will improve gait velocity to at least 1.3 ft/sec for improved gait efficiency and safety.  Baseline: 1.53 ft/sec 02/20/23 Goal status:MET 02/20/23  5.  Pt will ambulate 200 ft, household and short community distances, mod I, with least restrictive assistive device (cane vs. Walker) Baseline:  NT 02/20/23 Goal status: IN PROGRESS  6.  FOTO score to improve to 67  to demo improved overall functional mobility.  Baseline:  56 NT 02/20/23  Goal status:  IN PROGRESS  7. Patient to score at least 50/56 on Berg in order to decrease falls risk.  Baseline:  45 02/20/23  Goal status:  INITIAL   7. Patient to report ability to access local community such as church and grocery store with LRAD.  Baseline:  reports using w/c outside of house 02/20/23  Goal status:  INITIAL  ASSESSMENT:  CLINICAL IMPRESSION: Reports improved back pain and no radiating symptoms at present. Initiated with heavy resistance training for improved strength and motor control with greater deficits affecting LLE for isolated movements but excellent tolerance throughout.  Heavy work for motor control and balance by way of pulling/pushing heavy resistance for gross motor coordination. Able to ambulate with more natural, narrow BOS when walking against resistance.  Will trial U-step with tensioned wheels to see if this helps improve gait pattern and dynamic balance. Continued sessions to progress POC details to improve mobility and reduce risk fro falls.   OBJECTIVE IMPAIRMENTS: Abnormal gait, decreased balance, decreased mobility, difficulty walking, decreased strength, impaired tone, impaired vision/preception, and postural dysfunction.   ACTIVITY LIMITATIONS: carrying, lifting, standing, squatting, stairs, transfers, bathing, toileting, dressing, reach over head, hygiene/grooming, locomotion level, and caring for others  PARTICIPATION LIMITATIONS: meal prep, cleaning, laundry, interpersonal relationship, driving, shopping, community activity, occupation, and church  PERSONAL FACTORS: 3+ comorbidities: See PMH above  are also affecting patient's functional outcome.   REHAB POTENTIAL: Good  CLINICAL DECISION MAKING: Evolving/moderate complexity  EVALUATION COMPLEXITY: Moderate  PLAN:  PT FREQUENCY: 1x/week for 6 weeks  PT DURATION: other: -  PLANNED INTERVENTIONS: 97110-Therapeutic  exercises, 97530- Therapeutic activity, [id number]- Neuromuscular re-education, 97535- Self Care, [id number]- Manual therapy, [phone]- Gait training, [phone]- Orthotic Fit/training, [phone]- Aquatic Therapy, Patient/Family education, Balance training, Stair training, DME instructions, and Wheelchair mobility training  PLAN FOR NEXT SESSION: Gait with U-step and tensioned wheels for gait training more narrow BOS     10:15 AM, 03/19/23 M. Shary Decamp, PT, DPT Physical Therapist- [place] Office Number: [phone]

## 2023-03-19 NOTE — Therapy (Signed)
 OUTPATIENT OCCUPATIONAL THERAPY NEURO  Treatment Note   Patient Name: Joan Robinson MRN: [id number] DOB:[birth year], 63 y.o., female Today's Date: 03/19/2023  PCP: April Manson, NP REFERRING PROVIDER: Micki Riley, MD  END OF SESSION:  OT End of Session - 03/19/23 1124     Visit Number 6    Number of Visits 17    Date for OT Re-Evaluation 04/05/23    Authorization Type [place] Medicaid-[employer] 2025 as of 03/16/23    Authorization Time Period 27 visits combined OT/PT/ST    OT Start Time 1102    OT Stop Time 1144    OT Time Calculation (min) 42 min    Behavior During Therapy WFL for tasks assessed/performed                  Past Medical History:  Diagnosis Date   Allergic rhinitis    Anemia 10/10/2011   Anxiety and depression 01/17/2007   Qualifier: Diagnosis of  By: Everardo All MD, Sean A    Arthritis 07/24/2013   Likely inflammatory and following with Dr Maryln Gottron of [facility]  rheumatology   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   BCC (basal cell carcinoma of skin) 06/01/2012   Leg Follows with Dr Margo Aye   Bipolar disorder (HCC) 01/17/2007   Qualifier: Diagnosis of  By: Everardo All MD, Sean A    Cataract    Diverticulosis    Diverticulosis    Emphysema of lung (HCC)    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   Gallstones    GERD (gastroesophageal reflux disease)    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   Hashimoto's disease    Hyperlipidemia    Hypertension    Hypothyroidism 08/24/2006   Qualifier: Diagnosis of  By: Everardo All MD, Sean A     IBS (irritable bowel syndrome) 07/27/2016   Obesity 11/01/2013   PUD (peptic ulcer disease)    Sleep apnea 04/27/2016   Tobacco abuse    Past Surgical History:  Procedure Laterality Date   ABDOMINAL HYSTERECTOMY     ABDOMINAL HYSTERECTOMY  01/15/2009   complete   COLONOSCOPY     DILATION AND CURETTAGE OF UTERUS  01/16/1983   EYE SURGERY  03/03/2014   Surgery on both eyes for epiretinal membrane (vitreous peel)   Gated Spect wall motion stress  cardiolite  11/05/2001   HIATAL HERNIA REPAIR N/A 07/12/2021   Procedure: LAPAROSCOPY W/ EXTENSIVE FOREGUT DISSECTION; PARTIAL STOMACH REDUCTION; GASTROSTOMY TUBE PLACEMENT; GASTROPEXY;  Surgeon: Luretha Murphy, MD;  Location: [facility];  Service: General;  Laterality: N/A;   LAPAROSCOPIC RIGHT HEMI COLECTOMY Right 10/16/2022   Procedure: LAPAROSCOPIC ASSISTED RIGHT HEMI COLECTOMY;  Surgeon: Andria Meuse, MD;  Location: [facility];  Service: General;  Laterality: Right;   POLYPECTOMY     TUBAL LIGATION  01/15/1993   UTERINE SUSPENSION     mesh   VITRECTOMY Bilateral 03/03/2014   XI ROBOTIC ASSISTED HIATAL HERNIA REPAIR N/A 05/01/2021   Procedure: XI ROBOTIC ASSISTED TYPE III HIATAL HERNIA REPAIR WITH FUNDOPLICATION;  Surgeon: Luretha Murphy, MD;  Location: [facility];  Service: General;  Laterality: N/A;   Patient Active Problem List   Diagnosis Date Noted   Dysphagia 11/05/2022   Vestibular dizziness 11/05/2022   Malnutrition of moderate degree 11/01/2022   Acute stroke due to ischemia [facility]) 10/25/2022   CVA (cerebral vascular accident) (HCC) 10/25/2022   Protein-calorie malnutrition, severe 10/19/2022   Diverticulosis 10/14/2022   History of emphysema (HCC) 10/14/2022   SBO (small bowel obstruction) (HCC) 10/13/2022   Aortic valve  calcification 09/20/2022   Abdominal aortic atherosclerosis (HCC) 09/20/2022   Hiatal hernia 07/12/2021   Status post laparoscopic Nissen fundoplication 05/01/2021   IBS (irritable bowel syndrome) 07/27/2016   High-tone pelvic floor dysfunction 05/09/2016   Midline cystocele 05/09/2016   Vaginal atrophy 05/09/2016   Bladder prolapse, female, acquired 05/07/2016   Dystrophic nail 04/29/2016   Fatigue 04/27/2016   Sleep apnea 04/27/2016   UTI (urinary tract infection) 09/11/2015   Cataract cortical, senile, left 12/20/2014   Rectal lesion 10/10/2014   Other type of migraine without status migrainosus 05/06/2014   Dyspepsia 05/06/2014   Postmenopausal  estrogen deficiency 05/06/2014   Screening for breast cancer 05/06/2014   Bilateral low-tension glaucoma, indeterminate stage 12/29/2013   High myopia, bilateral 12/29/2013   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   Obesity 11/01/2013   Eustachian tube dysfunction 10/08/2013   Otalgia of left ear 10/08/2013   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   Arthritis 07/24/2013   Anxiety attack 03/08/2013   Paresthesia 12/22/2012   Headache 08/17/2012   BCC (basal cell carcinoma of skin) 06/01/2012   Left arm pain 05/10/2012   Right knee pain 05/10/2012   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   ADD (attention deficit disorder) 04/13/2012   Perimenopausal 01/16/2012   Macular pucker of both eyes 10/18/2011   Vitreous degeneration 10/18/2011   Chronic anemia 10/10/2011   Preventative health care 10/10/2011   Urticaria, chronic 10/10/2011   Macular pucker, bilateral 10/10/2011   Lower back pain    DIVERTICULITIS-COLON 07/25/2009   Diverticulitis of colon 07/25/2009   Hyperlipidemia 06/09/2009   Essential hypertension 06/09/2009   History of esophageal stricture 09/06/2008   GERD (gastroesophageal reflux disease) 07/27/2008   Bipolar disorder (HCC) 01/17/2007   Hypothyroidism 08/24/2006   Allergic rhinitis 08/24/2006   URINARY INCONTINENCE 08/24/2006    ONSET DATE: 10/13/22  REFERRING DIAG: I63.9 (ICD-10-CM) - Brainstem stroke  THERAPY DIAG:  Unsteadiness on feet  Muscle weakness (generalized)  Hemiplegia and hemiparesis following cerebral infarction affecting left non-dominant side (HCC)  Other disturbances of skin sensation  Other lack of coordination  Visuospatial deficit  Rationale for Evaluation and Treatment: Rehabilitation  SUBJECTIVE:   SUBJECTIVE STATEMENT: Pt reports having an appt with "disability doctor" on the 11th and f/u with neurologist.    Pt accompanied by: self and significant other (husband dropped her  off)  PERTINENT HISTORY: admitted 9/28 with RUQ pain with SBO. 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 PMhx: HTN, HLD, peptic ulcer disease, diverticulitis, bipolar disorder, hypothyroidism, GERD, Hashimoto's disease.  PRECAUTIONS: Fall  WEIGHT BEARING RESTRICTIONS:  lifting restrictions: not to lift anything > gallon of milk for 2 weeks  PAIN:  Are you having pain? Yes: NPRS scale: 6/10 Pain location: lower back Pain description: constant throbbing, pulling sensation Aggravating factors: bending, lifting Relieving factors: rest  FALLS: Has patient fallen in last 6 months? Yes. Number of falls 1 fall prior to stroke and fell when out feeding the animals  LIVING ENVIRONMENT: Lives with: lives with their spouse and adult grandson with disabilities (dtr comes every other day to assist with shower) Lives in: House/apartment Stairs: Yes: External: 6 steps in front, 10 steps at back steps; on left going up; was to have ramp built but this has not happened yet and is managing steps with assist from husband Has following equipment at home: Single point cane, Walker - 2 wheeled, Environmental consultant - 4 wheeled, Wheelchair (manual), shower chair, bed side  commode, and Grab bars (next to toilet and 2 in the shower)  PLOF: Independent and Independent with basic ADLs  PATIENT GOALS: I want to get back to doing as much as I can.  OBJECTIVE:  Note: Objective measures were completed at Evaluation unless otherwise noted.  HAND DOMINANCE: Right  ADLs: Transfers/ambulation related to ADLs: w/c in the house or walking with RW when dtr is in the house, short distance walking from house to the car with cane and/or physical assistance from spouse Eating: husband cutting foods, pt utilizing straw for drinking, and does spill drinks and food intermittently  Grooming: Mod I  UB Dressing: assist for fastening bra, to don shirt LB Dressing: assist for  donning socks, has elastic laces for shoes but has been able to tie shoes Toileting: Supervision, spouse will assist intermittently Bathing: Supervision, dtr setup for soap and washing back and hair Tub Shower transfers: hand held assist when stepping in/out of shower Equipment: Shower seat with back, Grab bars, Walk in shower, bed side commode, Reacher, and Long handled sponge  IADLs: Light housekeeping: husband and dtr doing all Meal Prep: husband doing all the cooking Community mobility: not driving Medication management: dtr dispenses meds in pill box and utilizes alarm for reminders  Handwriting:  TBD - pt reports different but unsure if vision vs coordination  MOBILITY STATUS: Needs Assist: Supervision to CGA for mobility with RW, utilizing hand held assist when transferring to/from car and use of w/c in the home and community  POSTURE COMMENTS:  rounded shoulders  ACTIVITY TOLERANCE: Activity tolerance: diminished  FUNCTIONAL OUTCOME MEASURES: FOTO: 64, predicted 73 in 17 visits 02/20/23: 58, pt reports it's difficult to answer as "I am so right handed".    UPPER EXTREMITY ROM:    Active ROM Right eval Left eval  Shoulder flexion WFL 160*  Shoulder abduction [facility]   Shoulder adduction    Shoulder extension    Shoulder internal rotation [facility]  Shoulder external rotation [facility]  Elbow flexion [facility] WFL  Elbow extension WFL Slight bend  Wrist flexion    Wrist extension    Wrist ulnar deviation    Wrist radial deviation    Wrist pronation    Wrist supination    (Blank rows = not tested)  UPPER EXTREMITY MMT:   Grossly 4+/5 bilaterally  HAND FUNCTION: 02/20/23: R: 34# and L: 50#  COORDINATION: 9 Hole Peg test: Right: 25.87 sec; Left: 49.78 sec Box and Blocks:  Right 57 blocks, Left 46 blocks 02/20/23: Box and blocks: Right: 46 blocks and Left: 44 blocks 02/20/23: 9 hole peg test: L: 40.62 sec   SENSATION: Light touch: WFL Pt reports impaired sensation of hot/cold  and pinch on RUE and face,   COGNITION: Overall cognitive status: Within functional limits for tasks assessed  VISION: Subjective report: "this eye (left) doesn't close.  I can see shadows/change in light" Baseline vision: Wears glasses all the time  VISION ASSESSMENT: Eye alignment: Impaired: Left eye lower lid severely droops, upper lid droops; L eye moves with R eye but no vision out of L eye  Ocular ROM: restricted on looking left and restricted on looking up Gaze preference/alignment: WDL Tracking/Visual pursuits: Decreased smoothness with vertical tracking Saccades: additional head turns occurred during testing Convergence: Impaired:   Visual Fields: Left visual field deficits Depth perception: Undershoots  Patient has difficulty with following activities due to following visual impairments: writing, reading  PRAXIS: Impaired: Motor planning; Ataxia on RUE  OBSERVATIONS: Pt well  groomed and presented very knowledgeable about current status as well as medical history.  Pt limited in mobility in home due to reporting that her husband and adult grandson are both disabled and can provide intermittent assistance.   TODAY'S TREATMENT:                                                                              DATE:  03/19/23 Cards: engaged in bimanual task of shuffling with pt demonstrating difficulty due to arthritis and impaired coordination.  Pt then setting up and playing a game of solitaire with focus on visual spatial awareness, memory, sequencing, and coordination.  OT providing min-mod cues to facilitate increased scanning to R and L to attend to and recognize cards to play.  Hand Gripper: with RUE on level 30#, progressing to 35#. Pt picked up 1 inch blocks with gripper with 3 drops on 30# and 2 drops on 35#.  OT challenging pt to place blocks on targets based on matching color to incorporate visual scanning.   Pt with min-mod difficulty, especially as task continued due to  decreased sustained grasp.  Attempted with L hand on 50#, pt completing 70% before terminating task due to increased pain in L wrist.   Connect 4: engaged in in-hand manipulation and translation of checker pieces from palm to finger tips to place into elevated Connect 4 grid to facilitate increased reach and motor coordination while also integrating visual scanning/attention.  Pt demonstrating good sequencing and manipulation of checker pieces in hand, benefiting from min cues for head turns to compensate for decreased attention to L visual field and task started on R (by therapist).      03/13/23 Self-care: educated on visit limit with Medicaid and possibility of local pro-bono clinics from continued therapy as needed/appropriate.   Don/doff jacket: pt donned/doffed jacket both in sitting and standing, donning R arm first and then L arm first with focus on quality of movement.  Pt able to don and doff jacket in both sitting and standing, donning alternating arm first.  OT providing cue to attend to task and "slow down" to ensure proper setup to increase ease. Energy conservation: educated on 4P's of energy conservation (planning, prioritizing, pacing, and positioning) and providing cues and examples for each.  Pt reports sitting when bathing, having someone bring a pile or box to her sitting in recliner and making piles as she is organizing and decluttering. Coordination: w/ LUE/RUEas appropriate, including: picking up 5-10 coins 1 at a time and placing into coin slot, and picking up 5-10 coins 1 at a time and translating from palm to finger tips to place into coin slot.  Pt demonstrating increased gross and fine motor ataxia on LUE > RUE.   9 hole peg test: 29.47 seconds.  Pt still demonstrating mild gross motor ataxia, but much improved fine motor control this session.    02/28/23 Putty: engaged in full grasp, "duck mouth", tip to tip, 3 point, as well as pinch and pull, and pinch and twist to  simulate various grips as needed for aspects of ADLs/IADLs. Pt completing with RUE and LUE as pt with changes in sensation with use with RUE and mild ataxia with  LUE.  Pt then removing 5 stones from putty with focus on pinch strength and coordination.  Pipe tree puzzle: engaged in pipe tree puzzle in standing with focus on coordination and manipulation of pieces, visual scanning, and problem solving.  Pt demonstrating good functional use of LUE when picking up and manipulating pieces, requiring use of RUE to stabilize items intermittently.  Therapist providing min cues for sequencing and selection of appropriate length PVC pieces.  Pt with good standing balance and tolerance.   PATIENT EDUCATION: Education details: educated on visual attention and turning head for improved spatial awareness Person educated: Patient Education method: Explanation Education comprehension: verbalized understanding and needs further education  HOME EXERCISE PROGRAM: Access Code: [email or web address] URL: [email or web address] Date: 02/28/2023 Prepared by: [facility] - Outpatient  Rehab - [facility]  Exercises - Putty Squeezes  - 1 x daily - 10 reps - Rolling Putty on Table  - 1 x daily - 10 reps - Tip Pinch with Putty  - 1 x daily - 10 reps - 3-Point Pinch with Putty  - 1 x daily - 10 reps - Finger Pinch and Pull with Putty  - 1 x daily - 10 reps - Removing Marbles from Putty  - 1 x daily - 10 reps   GOALS: Goals reviewed with patient? Yes  NEW SHORT TERM GOALS: Target date: 03/15/23  Pt will be Mod I with initial neuromuscular re-education HEP for R UE. Baseline: Goal status: MET - reports completing coordination and putty exercises "couple times a week" on 03/13/23  2.  Pt will be able to don/doff jacket with Supervision/setup utilizing adaptive strategies PRN. Baseline:  02/20/23: pt's husband still assisting with jacket, especially L side due to visual impairment Goal status: MET -  03/13/23  3.  Pt will verbalize understanding of compensatory strategies to accommodate for decreased vision in L visual field. Baseline:  02/20/23: pt reports still having difficulty, have not been able to truly address as pt has had large gap between visits  Goal status: IN PROGRESS  4.  Pt will verbalize understanding of energy conservation strategies and report 2 strategies being utilized during ADLs/IADLs to increase safety and independence. Baseline:  02/20/23: have not been able to fully address due to large gap between visits Goal status: MET - 03/13/23   LONG TERM GOALS: Target date: 04/05/23  Pt will demonstrate improved fine motor coordination for ADLs (clothing fasteners) as evidenced by decreasing 9 hole peg test score for LUE to < 35 seconds. Baseline: L: 49.78 sec on 01/08/23 02/20/23 - L: 40.62 sec  Goal status: MET - 29.47 sec on 03/13/23  2.  Pt will demonstrate improved motor control to safely retrieve a moderate weight object at from shelf at mod-high range.  Baseline:  Goal status: IN PROGRESS  3.  Pt will demonstrate improved grip strength as needed to open new/tight food containers. Baseline:  Goal status: IN PROGRESS  4.  Pt will demonstrate improved coordination and motor control with LUE during bimanual tasks requiring increased weight and/or force. Baseline:  Goal status: IN PROGRESS   ASSESSMENT:  CLINICAL IMPRESSION: Pt continues to demonstrate improvements in coordination with RUE, particularly with translation this session and gross motor control.  Pt continues to demonstrate decreased grip strength on R, impacting ease of opening containers.  Pt with inconsistent attention to entire visual field, particularly when engaging in card activity this session.  Pt will benefit from continued skilled occupational therapy services to address strength and coordination, ROM, pain  management, altered sensation, balance, GM/FM control, safety awareness, introduction of  compensatory strategies/AE prn, visual-perception, and implementation of an HEP to improve participation and safety during ADLs and IADLs.     PERFORMANCE DEFICITS: in functional skills including ADLs, IADLs, coordination, sensation, ROM, strength, pain, Fine motor control, Gross motor control, mobility, balance, body mechanics, endurance, decreased knowledge of precautions, decreased knowledge of use of DME, vision, and UE functional use and psychosocial skills including coping strategies, environmental adaptation, and routines and behaviors.     PLAN:  OT FREQUENCY: 2x/week  OT DURATION: 6 weeks  PLANNED INTERVENTIONS: 97168 OT Re-evaluation, 97535 self care/ADL training, 16109 therapeutic exercise, 97530 therapeutic activity, 97112 neuromuscular re-education, 97035 ultrasound, 97010 moist heat, 97010 cryotherapy, 97760 Orthotics management and training, balance training, functional mobility training, compression bandaging, visual/perceptual remediation/compensation, psychosocial skills training, energy conservation, coping strategies training, patient/family education, and DME and/or AE instructions  RECOMMENDED OTHER SERVICES: NA  CONSULTED AND AGREED WITH PLAN OF CARE: Patient  PLAN FOR NEXT SESSION: Educate on LB dressing strategies; continue coordination, reaching, standing activities; educate on visual and sensation compensatory strategies  Engage in WB through BUE in standing at counter top or elevated mat to focus on ataxia/gross motor control prior to [facility], could do reaction timer pods in WB if tolerated.   Rosalio Loud, OTR/L 03/19/2023, 11:25 AM   [facility] at [facility] [address] [place], [address] [place], [place] [address] Phone # [phone] Fax # [phone]

## 2023-03-20 ENCOUNTER — Encounter: Payer: Self-pay | Admitting: Neurology

## 2023-03-20 ENCOUNTER — Ambulatory Visit (INDEPENDENT_AMBULATORY_CARE_PROVIDER_SITE_OTHER): Payer: BC Managed Care – PPO | Admitting: Neurology

## 2023-03-20 VITALS — BP 112/65 | HR 67 | Ht 66.0 in | Wt 147.0 lb

## 2023-03-20 DIAGNOSIS — G510 Bell's palsy: Secondary | ICD-10-CM | POA: Diagnosis not present

## 2023-03-20 DIAGNOSIS — I639 Cerebral infarction, unspecified: Secondary | ICD-10-CM

## 2023-03-20 DIAGNOSIS — H9192 Unspecified hearing loss, left ear: Secondary | ICD-10-CM | POA: Diagnosis not present

## 2023-03-20 NOTE — Progress Notes (Unsigned)
 [facility] [address] [place]. [place] [address] [id number]       OFFICE FOLLOW-UP VISIT NOTE  Joan Robinson Date of Birth:  [birth year] Medical Record Number:  [id number]   Referring MD: Wendi Maya, PA-C  Reason for Referral: Stroke  HPI: Initial consult 12/11/2022 [name] is a 63 year old pleasant Caucasian lady seen today for initial office consultation visit for stroke.  She is accompanied by her sister and daughter and history is obtained from them and review of electronic medical records.  I personally reviewed pertinent available imaging films in PACS.  She is a  63 y.o. female with past medical history of esophageal strictures status post dilation, multiple hernias status post multiple surgeries, GERD/PUD, hypertension, hyperlipidemia, autoimmune urticaria, emphysema, sleep apnea who presents to the ED on 10/13/2022 with right upper quadrant and right lower quadrant abdominal pain x 2 days associated nausea.  She was found to have small bowel obstruction and was being managed inpatient at [facility].  Around shift change, patient was noted to have left facial droop, slurring of her speech and upon further questioning, symptoms were first noted at 1700 on 10/14/2022.  Her team had difficulty establishing a last known well.  However, symptoms were first noted at 1700.  A code stroke was activated if she had a CT head without contrast which was notable for a moderately large left PICA territory infarct.  She was eval by teleneurology.  She was not given TNKase due to unclear last known well and the fact that the stroke looks completed.  CT angio head and neck with and without contrast was negative for any large vessel occlusion.  Specifically, no basilar occlusion or thrombosis noted.  CT perfusion with no core or mismatch noted.  Given the moderately large posterior fossa stroke which is in proximity to brainstem, and the extension of the stroke into  the brainstem, she was transferred to [facility] medical ICU for immediate in person neurology evaluation and for close monitoring for signs of posterior fossa crowding and potential herniation.  She was monitored carefully in the ICU and remained stable and did not require any neurosurgical intervention.  MRI showed a large acute infarct involving both left PICA and Icar territory with mass effect on posterior fossa and fourth ventricle mild hydrocephalus.  2D echo showed ejection fraction of 60 to 65%.  Left atrial size was normal.  LDL cholesterol 54 mg percent.  Hemoglobin A1c was 5.6.  Patient had significant left peripheral facial nerve weakness as well as dysphagia and left hemiataxia.  She was seen by physical Occupational Therapy transfer to inpatient rehab where she stayed for a month.  She made gradual improvement in his been home now for nearly 4 weeks.  She still has significant left facial weakness with dryness of the left eye with redness.  She is unable to close her left eye.  She does have an upcoming appointment to see ophthalmologist next week.  She is still not gotten any hearing back in the left ear.  She still has trouble swallowing has to swallow mostly on the right side.  She has a lot of drooling from the left corner of the mouth.  She still has some left-sided incoordination and she is working with therapist has shown some improvement.  She is still unable to walk on her own and requires 1 person assist with gait belt to walk with a wheelchair.  She is tolerating Plavix with minor bruising but  no bleeding.  Patient is clearly significantly disabled and is currently on short-term disability and thinking about going on a long disability.  Patient also has a new complaint of neck pain and right arm numbness.  Her primary care physician has ordered an MRI of the C-spine is scheduled for next week for this.  Last CT head on 10/28/2022 had shown decrease mass effect in the posterior fossa with  improved patency of the fourth ventricle. Update 03/20/2023 ; she returns for follow-up after her last visit 3 months ago.  She is accompanied by her husband.  She states she is doing well.  Her balance is better but she still occasionally gets off balance particularly when she makes a quick turn.  She can walk short distances but prefers to use a wheeled walker for long distances so that she does not have to grab at things.  She has had a few near falls but no injuries.  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  She remains on Plavix which is tolerating well with minor bruising and no bleeding.  She is tolerating Crestor well without muscle aches and pains.  She did wear a 30-day heart monitor in January this year which showed no evidence of paroxysmal A-fib.  She has had no recurrent stroke or TIA symptoms.  She has no new complaints today. ROS:   14 system review of systems is positive for facial nerve weakness, drooling, numbness, difficulty swallowing, imbalance, weakness, gait difficulty, decreased hearing all other systems negative  PMH:  Past Medical History:  Diagnosis Date   Allergic rhinitis    Anemia 10/10/2011   Anxiety and depression 01/17/2007   Qualifier: Diagnosis of  By: Everardo All MD, Sean A    Arthritis 07/24/2013   Likely inflammatory and following with Dr Maryln Gottron of [facility]  rheumatology   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   BCC (basal cell carcinoma of skin) 06/01/2012   Leg Follows with Dr Margo Aye   Bipolar disorder (HCC) 01/17/2007   Qualifier: Diagnosis of  By: Everardo All MD, Sean A    Cataract    Diverticulosis    Diverticulosis    Emphysema of lung (HCC)    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   Gallstones    GERD (gastroesophageal reflux disease)    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    Hashimoto's disease    Hyperlipidemia    Hypertension    Hypothyroidism 08/24/2006   Qualifier: Diagnosis of  By: Everardo All MD, Sean A     IBS (irritable bowel syndrome) 07/27/2016   Obesity 11/01/2013   PUD (peptic ulcer disease)    Sleep apnea 04/27/2016   Tobacco abuse     Social History:  Social History   Socioeconomic History   Marital status: Married    Spouse name: Not on file   Number of children: 2   Years of education: Not on file   Highest education level: Not on file  Occupational History   Occupation: Accounts Receivable  Tobacco Use   Smoking status: Former    Current packs/day: 0.00    Average packs/day: 1 pack/day for 30.0 years (30.0 ttl pk-yrs)    Types: Cigarettes    Start date: 05/16/1979    Quit date: 05/15/2009    Years since quitting: 13.8   Smokeless tobacco: Never  Vaping Use   Vaping status: Never Used  Substance and Sexual Activity   Alcohol use: No   Drug use: No   Sexual activity: Never  Other Topics Concern   Not on file  Social History Narrative   Pt lives with husband    Retired    Chief Executive Officer Drivers of Corporate investment banker Strain: Low Risk  (02/26/2023)   Received from [facility]   Overall Financial Resource Strain (CARDIA)    Difficulty of Paying Living Expenses: Not very hard  Food Insecurity: No Food Insecurity (02/26/2023)   Received from [facility]   Hunger Vital Sign    Worried About Running Out of Food in the Last Year: Never true    Ran Out of Food in the Last Year: Never true  Transportation Needs: No Transportation Needs (02/26/2023)   Received from [facility] - Transportation    Lack of Transportation (Medical): No    Lack of Transportation (Non-Medical): No  Physical Activity: Sufficiently Active (05/29/2022)   Received from [facility]   Exercise Vital Sign    Days of Exercise per Week: 5 days    Minutes of Exercise per Session: 30 min  Stress: Stress Concern Present (05/29/2022)    Received from [facility] - Occupational Stress Questionnaire    Feeling of Stress : To some extent  Social Connections: Moderately Integrated (05/29/2022)   Received from [facility]   Social Network    How would you rate your social network (family, work, friends)?: Adequate participation with social networks  Intimate Partner Violence: Not At Risk (10/14/2022)   Humiliation, Afraid, Rape, and Kick questionnaire    Fear of Current or Ex-Partner: No    Emotionally Abused: No    Physically Abused: No    Sexually Abused: No    Medications:   Current Outpatient Medications on File Prior to Visit  Medication Sig Dispense Refill   acetaminophen (TYLENOL) 325 MG tablet Take 1-2 tablets (325-650 mg total) by mouth every 4 (four) hours as needed for mild pain (pain score 1-3).     amphetamine-dextroamphetamine (ADDERALL) 20 MG tablet Take by mouth.     artificial tears (LACRILUBE) OINT ophthalmic ointment Place into the left eye 2 (two) times daily. 3.5 g 1   Cholecalciferol (D 1000) 25 MCG (1000 UT) capsule Take 1,000 Units by mouth daily.     clopidogrel (PLAVIX) 75 MG tablet Take 1 tablet (75 mg total) by mouth daily. 30 tablet 0   estradiol (ESTRACE) 0.1 MG/GM vaginal cream Place 1 Applicatorful vaginally 2 (two) times a week.     HYDROcodone-acetaminophen (NORCO) 10-325 MG tablet Take 1 tablet by mouth every 8 (eight) hours as needed.     latanoprost (XALATAN) 0.005 % ophthalmic solution Place 1 drop into both eyes at bedtime.     melatonin 10 MG TABS Take 10 mg by mouth at bedtime as needed.     nitroGLYCERIN (NITROSTAT) 0.4 MG SL tablet Place 0.4 mg under the tongue every 5 (five) minutes as needed for chest pain.     ondansetron (ZOFRAN) 4 MG tablet Take 1 tablet (4 mg total) by mouth every 8 (eight) hours as needed for nausea or vomiting. Take 1-2 tablets every 4-6 hours as needed for nausea (Patient taking  differently: Take 4 mg by mouth every 8 (eight) hours as needed for nausea or vomiting.) 30 tablet 2  pantoprazole (PROTONIX) 40 MG tablet Take 1 tablet (40 mg total) by mouth daily. 90 tablet 3   SYNTHROID 88 MCG tablet Take 88 mcg by mouth daily before breakfast.     rosuvastatin (CRESTOR) 20 MG tablet Take 1 tablet (20 mg total) by mouth daily. 90 tablet 3   No current facility-administered medications on file prior to visit.    Allergies:   Allergies  Allergen Reactions   Aspirin Shortness Of Breath   Nsaids Shortness Of Breath   Duloxetine Hcl Rash   Keflex [Cephalexin] Diarrhea   Butamben-Tetracaine-Benzocaine     Mouth sores   Dicyclomine Hcl     REACTION: mouth ulcers   Metronidazole Hives    mouth ulcers   Phenergan [Promethazine Hcl] Other (See Comments)    "knocks" pt out for about 3 days   Quetiapine     Somnolence. Slept for 36 hours straight.   Benay Spice [Lifitegrast]     Burned eyes   Levothyroxine Palpitations    Pt must use brand SYNTHROID   Moxifloxacin Nausea Only and Palpitations    REACTION: increased heart rate   Moxifloxacin Hcl In Nacl Palpitations   Orphenadrine Citrate Palpitations   Sulfamethoxazole-Trimethoprim Nausea Only and Palpitations    REACTION: increased heart rate, nausea    Physical Exam General: well developed, well nourished pleasant middle-age Caucasian lady, seated, in no evident distress Head: head normocephalic and atraumatic.   Neck: supple with no carotid or supraclavicular bruits Cardiovascular: regular rate and rhythm, no murmurs Musculoskeletal: no deformity Skin:  no rash/petichiae Vascular:  Normal pulses all extremities Redness of the left eye with slight corneal haziness. Neurologic Exam Mental Status: Awake and fully alert. Oriented to place and time. Recent and remote memory intact. Attention span, concentration and fund of knowledge appropriate. Mood and affect appropriate.  Cranial Nerves: Fundoscopic exam  deferred.  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  She has significantly diminished vision in the left eye.Marland Kitchen Extraocular movements full without nystagmus. Visual fields full to confrontation. Hearing loss in left ear.  Facial sensation diminished on the right face.  Severe left peripheral pattern 7th nerve weakness including forehead eyelids and lower face., tongue, palate moves normally and symmetrically.  Motor: Normal bulk and tone. Normal strength in all tested extremity muscles. Sensory.:  Diminished sensation in the right hand.  Normal elsewhere. Coordination: Minimal left hemiataxia arm greater than leg.. Gait and Station: Deferred as patient is unable even with 1 person assist.  Reflexes: 1+ and symmetric. Toes downgoing.      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  She has significant residual disabling left peripheral nerve palsy, hearing loss, dysphagia and left hemiataxia.     PLAN:II had a long d/w patient and her husband about her  recent cerebellar and brainstem stroke, risk for recurrent stroke/TIAs, personally independently reviewed imaging studies and stroke evaluation results and answered questions.Continue Plavix 75 mg daily for secondary stroke prevention and maintain strict control of hypertension with blood pressure goal below 130/90, diabetes with hemoglobin A1c goal below 6.5% and lipids with LDL cholesterol goal below 70 mg/dL. I also advised the patient to eat a healthy diet with plenty of whole grains, cereals, fruits and vegetables, exercise regularly and maintain ideal body weight.  Patient is scheduled to undergo cochlear implant for her left ear deafness and may hold Plavix for 3 to 5 days  prior to the procedure with a small but acceptable periprocedural risk of stroke/TIA if patient is willing.  Resume Plavix after  the procedure when safe.  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  Check screening carotid ultrasound, lipid profile and hemoglobin A1c.  Patient is significantly disabled from her stroke and will not be able to return to work anytime soon and I filled out disability paperwork for her upon her request..  Followup in the future with with my nurse practitioner in 6 months months or call earlier if necessary Greater than 50% time during this 35-minute  visit was spent in counseling and coordination of care about the stroke and significant vessel deficits and discussion with patient and family and answering questions. Delia Heady, MD Note: This document was prepared with digital dictation and possible smart phrase technology. Any transcriptional errors that result from this process are unintentional.

## 2023-03-20 NOTE — Patient Instructions (Addendum)
 I had a long d/w patient and her husband about her  recent cerebellar and brainstem stroke, risk for recurrent stroke/TIAs, personally independently reviewed imaging studies and stroke evaluation results and answered questions.Continue Plavix 75 mg daily for secondary stroke prevention and maintain strict control of hypertension with blood pressure goal below 130/90, diabetes with hemoglobin A1c goal below 6.5% and lipids with LDL cholesterol goal below 70 mg/dL. I also advised the patient to eat a healthy diet with plenty of whole grains, cereals, fruits and vegetables, exercise regularly and maintain ideal body weight.  Patient is scheduled to undergo cochlear implant for her left ear deafness and may hold Plavix for 3 to 5 days prior to the procedure with a small but acceptable periprocedural risk of stroke/TIA if patient is willing.  Resume Plavix after the procedure when safe.  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  Check screening carotid ultrasound, lipid profile and hemoglobin A1c.  Patient is significantly disabled from her stroke and will not be able to return to work anytime soon and I filled out disability paperwork for her upon her request..  Followup in the future with with my nurse practitioner in 6 months months or call earlier if necessary

## 2023-03-21 ENCOUNTER — Encounter: Payer: Commercial Managed Care - HMO | Admitting: Occupational Therapy

## 2023-03-21 ENCOUNTER — Ambulatory Visit: Payer: Commercial Managed Care - HMO

## 2023-03-21 ENCOUNTER — Telehealth: Payer: Self-pay | Admitting: *Deleted

## 2023-03-21 DIAGNOSIS — Z0289 Encounter for other administrative examinations: Secondary | ICD-10-CM

## 2023-03-21 LAB — LIPID PANEL
Chol/HDL Ratio: 1.7 ratio (ref 0.0–4.4)
Cholesterol, Total: 131 mg/dL (ref 100–199)
HDL: 77 mg/dL (ref >39)
LDL Chol Calc (NIH): 40 mg/dL (ref 0–99)
Triglycerides: 70 mg/dL (ref 0–149)
VLDL Cholesterol Cal: 14 mg/dL (ref 5–40)

## 2023-03-21 LAB — HEMOGLOBIN A1C
Est. average glucose Bld gHb Est-mCnc: 117 mg/dL
Hgb A1c MFr Bld: 5.7 % — ABNORMAL HIGH (ref 4.8–5.6)

## 2023-03-21 NOTE — Telephone Encounter (Signed)
 Pt [employer] [place] [employer] faxed on 03/21/2023

## 2023-03-21 NOTE — Telephone Encounter (Signed)
 Received call back from patient regarding her form. She stated that she needs her form complete and sent out TODAY. I let her know that all forms are completed with 7-10 business days of receipt and that I could not guarantee that the form would be completed today. She then stated that it needed to be completed by 3/12. Again, I let her know that I could not guarantee completion by that date but that I could let the clinical staff know that it needed to be completed urgently. I again let her know that I could not guarantee completion before the 7 day mark. She then argued that if the form was not done before the 12th that she would not get paid and she would be out even more money.  Patient finally paid form fee and form was placed in POD 3 with STAT on it for completion.

## 2023-03-26 ENCOUNTER — Encounter: Payer: Commercial Managed Care - HMO | Admitting: Occupational Therapy

## 2023-03-26 ENCOUNTER — Ambulatory Visit: Payer: Commercial Managed Care - HMO

## 2023-03-26 NOTE — Progress Notes (Signed)
 Kindly inform the patient that screening test for diabetes and cholesterol profile are both quite satisfactory

## 2023-03-27 ENCOUNTER — Ambulatory Visit: Payer: Commercial Managed Care - HMO

## 2023-03-27 ENCOUNTER — Ambulatory Visit: Payer: Commercial Managed Care - HMO | Admitting: Occupational Therapy

## 2023-03-27 ENCOUNTER — Ambulatory Visit: Payer: Commercial Managed Care - HMO | Admitting: Physical Therapy

## 2023-03-28 ENCOUNTER — Encounter: Payer: Commercial Managed Care - HMO | Admitting: Occupational Therapy

## 2023-03-28 ENCOUNTER — Ambulatory Visit: Payer: Commercial Managed Care - HMO

## 2023-04-01 ENCOUNTER — Ambulatory Visit (HOSPITAL_COMMUNITY)

## 2023-04-02 ENCOUNTER — Ambulatory Visit: Payer: Commercial Managed Care - HMO

## 2023-04-02 ENCOUNTER — Ambulatory Visit: Payer: Commercial Managed Care - HMO | Admitting: Physical Therapy

## 2023-04-02 ENCOUNTER — Ambulatory Visit: Payer: Commercial Managed Care - HMO | Admitting: Occupational Therapy

## 2023-04-04 ENCOUNTER — Encounter: Payer: Commercial Managed Care - HMO | Admitting: Occupational Therapy

## 2023-04-04 ENCOUNTER — Ambulatory Visit: Payer: Commercial Managed Care - HMO

## 2023-04-09 ENCOUNTER — Ambulatory Visit: Payer: Commercial Managed Care - HMO

## 2023-04-09 ENCOUNTER — Ambulatory Visit: Payer: Commercial Managed Care - HMO | Admitting: Occupational Therapy

## 2023-04-09 DIAGNOSIS — R2689 Other abnormalities of gait and mobility: Secondary | ICD-10-CM

## 2023-04-09 DIAGNOSIS — R471 Dysarthria and anarthria: Secondary | ICD-10-CM

## 2023-04-09 DIAGNOSIS — R278 Other lack of coordination: Secondary | ICD-10-CM

## 2023-04-09 DIAGNOSIS — M6281 Muscle weakness (generalized): Secondary | ICD-10-CM

## 2023-04-09 DIAGNOSIS — R1312 Dysphagia, oropharyngeal phase: Secondary | ICD-10-CM | POA: Diagnosis not present

## 2023-04-09 DIAGNOSIS — I69354 Hemiplegia and hemiparesis following cerebral infarction affecting left non-dominant side: Secondary | ICD-10-CM

## 2023-04-09 DIAGNOSIS — R208 Other disturbances of skin sensation: Secondary | ICD-10-CM

## 2023-04-09 DIAGNOSIS — R41842 Visuospatial deficit: Secondary | ICD-10-CM

## 2023-04-09 DIAGNOSIS — R2681 Unsteadiness on feet: Secondary | ICD-10-CM

## 2023-04-09 NOTE — Therapy (Signed)
 OUTPATIENT SPEECH LANGUAGE PATHOLOGY TREATMENT   Patient Name: Joan Robinson MRN: [id number] DOB:02/04/1960, 63 y.o., female Today's Date: 04/09/2023  PCP: Kathlen Brunswick, MD REFERRING PROVIDER:  Micki Riley, MD     END OF SESSION:  End of Session - 04/09/23 1031     Visit Number 5    Number of Visits 17    Date for SLP Re-Evaluation 04/19/23    SLP Start Time 1019    SLP Stop Time  1055    SLP Time Calculation (min) 36 min    Activity Tolerance Patient tolerated treatment well              Past Medical History:  Diagnosis Date   Allergic rhinitis    Anemia 10/10/2011   Anxiety and depression 01/17/2007   Qualifier: Diagnosis of  By: Everardo All MD, Sean A    Arthritis 07/24/2013   Likely inflammatory and following with Dr Maryln Gottron of [facility]  rheumatology   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   BCC (basal cell carcinoma of skin) 06/01/2012   Leg Follows with Dr Margo Aye   Bipolar disorder (HCC) 01/17/2007   Qualifier: Diagnosis of  By: Everardo All MD, Sean A    Cataract    Diverticulosis    Diverticulosis    Emphysema of lung (HCC)    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   Gallstones    GERD (gastroesophageal reflux disease)    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   Hashimoto's disease    Hyperlipidemia    Hypertension    Hypothyroidism 08/24/2006   Qualifier: Diagnosis of  By: Everardo All MD, Sean A     IBS (irritable bowel syndrome) 07/27/2016   Obesity 11/01/2013   PUD (peptic ulcer disease)    Sleep apnea 04/27/2016   Tobacco abuse    Past Surgical History:  Procedure Laterality Date   ABDOMINAL HYSTERECTOMY     ABDOMINAL HYSTERECTOMY  01/15/2009   complete   COLONOSCOPY     DILATION AND CURETTAGE OF UTERUS  01/16/1983   EYE SURGERY  03/03/2014   Surgery on both eyes for epiretinal membrane (vitreous peel)   Gated Spect wall motion stress cardiolite  11/05/2001   HIATAL HERNIA REPAIR N/A 07/12/2021   Procedure: LAPAROSCOPY W/ EXTENSIVE FOREGUT DISSECTION; PARTIAL  STOMACH REDUCTION; GASTROSTOMY TUBE PLACEMENT; GASTROPEXY;  Surgeon: Luretha Murphy, MD;  Location: [facility];  Service: General;  Laterality: N/A;   LAPAROSCOPIC RIGHT HEMI COLECTOMY Right 10/16/2022   Procedure: LAPAROSCOPIC ASSISTED RIGHT HEMI COLECTOMY;  Surgeon: Andria Meuse, MD;  Location: [facility];  Service: General;  Laterality: Right;   POLYPECTOMY     TUBAL LIGATION  01/15/1993   UTERINE SUSPENSION     mesh   VITRECTOMY Bilateral 03/03/2014   XI ROBOTIC ASSISTED HIATAL HERNIA REPAIR N/A 05/01/2021   Procedure: XI ROBOTIC ASSISTED TYPE III HIATAL HERNIA REPAIR WITH FUNDOPLICATION;  Surgeon: Luretha Murphy, MD;  Location: [facility];  Service: General;  Laterality: N/A;   Patient Active Problem List   Diagnosis Date Noted   Dysphagia 11/05/2022   Vestibular dizziness 11/05/2022   Malnutrition of moderate degree 11/01/2022   Acute stroke due to ischemia [facility]) 10/25/2022   CVA (cerebral vascular accident) (HCC) 10/25/2022   Protein-calorie malnutrition, severe 10/19/2022   Diverticulosis 10/14/2022   History of emphysema (HCC) 10/14/2022   SBO (small bowel obstruction) (HCC) 10/13/2022   Aortic valve calcification 09/20/2022   Abdominal aortic atherosclerosis (HCC) 09/20/2022   Hiatal hernia 07/12/2021   Status post laparoscopic Nissen fundoplication 05/01/2021   IBS (  irritable bowel syndrome) 07/27/2016   High-tone pelvic floor dysfunction 05/09/2016   Midline cystocele 05/09/2016   Vaginal atrophy 05/09/2016   Bladder prolapse, female, acquired 05/07/2016   Dystrophic nail 04/29/2016   Fatigue 04/27/2016   Sleep apnea 04/27/2016   UTI (urinary tract infection) 09/11/2015   Cataract cortical, senile, left 12/20/2014   Rectal lesion 10/10/2014   Other type of migraine without status migrainosus 05/06/2014   Dyspepsia 05/06/2014   Postmenopausal estrogen deficiency 05/06/2014   Screening for breast cancer 05/06/2014   Bilateral low-tension glaucoma, indeterminate stage  12/29/2013   High myopia, bilateral 12/29/2013   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   Obesity 11/01/2013   Eustachian tube dysfunction 10/08/2013   Otalgia of left ear 10/08/2013   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   Arthritis 07/24/2013   Anxiety attack 03/08/2013   Paresthesia 12/22/2012   Headache 08/17/2012   BCC (basal cell carcinoma of skin) 06/01/2012   Left arm pain 05/10/2012   Right knee pain 05/10/2012   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   ADD (attention deficit disorder) 04/13/2012   Perimenopausal 01/16/2012   Macular pucker of both eyes 10/18/2011   Vitreous degeneration 10/18/2011   Chronic anemia 10/10/2011   Preventative health care 10/10/2011   Urticaria, chronic 10/10/2011   Macular pucker, bilateral 10/10/2011   Lower back pain    DIVERTICULITIS-COLON 07/25/2009   Diverticulitis of colon 07/25/2009   Hyperlipidemia 06/09/2009   Essential hypertension 06/09/2009   History of esophageal stricture 09/06/2008   GERD (gastroesophageal reflux disease) 07/27/2008   Bipolar disorder (HCC) 01/17/2007   Hypothyroidism 08/24/2006   Allergic rhinitis 08/24/2006   URINARY INCONTINENCE 08/24/2006     SPEECH THERAPY DISCHARGE SUMMARY  Visits from Start of Care: 5  Current functional level related to goals / functional outcomes: See below. Pt PROM improved for both swallowing and speech compared to initial administration. Pt will continue dysphagia and dysarthria  HEPs at home for 8 weeks and then cont dysphagia HEP twice a week after that. Pt to cont to follow swallow precautions at home with better adherence to oral care after meals.   Remaining deficits: Dysphagia and dysarthria persist.    Education / Equipment: See individual therapy notes for details   Patient agrees to discharge. Patient goals were partially met. Patient is being discharged due to  pt comfortable continuing HEPs and swallow precautions at  home..     ONSET DATE: September 2024   REFERRING DIAG:  I63.9 (ICD-10-CM) - Brainstem stroke (HCC)    THERAPY DIAG:  Dysphagia, oropharyngeal phase  Dysarthria and anarthria  Rationale for Evaluation and Treatment: Rehabilitation  SUBJECTIVE:   SUBJECTIVE STATEMENT: "He said after 6 months a lot won't change."  Pt accompanied by: self  PERTINENT HISTORY: 63 yo admitted 9/28 with RUQ pain with SBO. 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 PMhx: HTN, HLD, peptic ulcer disease, diverticulitis, bipolar disorder, hypothyroidism, GERD, Hashimoto's disease. Hemicolectomy completed on 10/1.  PAIN:  Are you having pain? RUE pain - see OT eval    FALLS: Has patient fallen in last 6 months?  No  PATIENT GOALS: Improve speech and swallowing skills  OBJECTIVE:  Note: Objective measures were completed at Evaluation unless otherwise noted.  DIAGNOSTIC FINDINGS:  MRI 10/15/22 IMPRESSION: Large acute infarct (affecting both the left PICA and left AICA vascular territories) as described. Posterior fossa mass effect with effacement of the fourth ventricle inferiorly. No evidence of obstructive  hydrocephalus at this time. Mild inferior displacement of the cerebellar tonsils (which extend up to 4 mm below the level of the foramen magnum). No more than mild mass effect upon the medulla at this time. Close imaging follow-up is recommended to monitor the evolving posterior fossa mass effect.   RECOMMENDATIONS FROM OBJECTIVE SWALLOW STUDY (MBSS/FEES):   Swallowing Evaluation Impression (03/14/23)  The pt presents with a predominantly oral dysphagia. Oral pocketing and oral residue observed likely related to reduced sensory sensation and motoric innervation on R. Pt demonstrated timely, but inefficient chewing of bolus and difficulty clearing oral residue from palate, tongue and buccal cavity. Pt also displayed delayed  transport of the bolus and delayed initiation of the swallow at the level of the pyriform sinuses. Pt's hyoid excursion and laryngeal elevation were sufficient, however SLP observed delayed inversion of the epiglottis allowing rare trace and transient penetration of thin liquid. Some reduced tongue base retraction noted results in trace residuals. Pharyngeal residue remained on pharyngeal structures including the vallecula, tongue base and pharyngeal wall. Residue in the vallecula cleared with prompt for an additional dry swallow. Pt effectively swallowed half pill with thin liquids, demonstrating good control and adequate airway protection for mixed consistency. Esophageal retention observed requiring an additional bolus of thin liquids, which was effective to clear. SLP recommends pt proceed with oral diet, with consideration for oral abilities. Pt may do better orally with moistened, bite sized solids, taking small, manageable bites, checking for oral holding/pocketing and any anterior spillage, and taking multiple dry swallows after sips to clear leftover residue. Recommendations communicated to pt and spouse.  Pt to follow up with primary SLP in OP clinic. Swallow Evaluation Recommendations Recommendations: PO diet Liquid Administration via: Straw Medication Administration:  (Medication cut in half, then swallowed with thin liquid) Supervision: Patient able to self-feed Swallowing strategies  : Small bites/sips;Check for pocketing or oral holding;Check for anterior loss;Multiple dry swallows after each bite/sip;Minimize environmental distractions Postural changes: Position pt fully upright for meals;Stay upright 30-60 min after meals Oral care recommendations: Oral care BID (2x/day)  Swallowing Evaluation Recommendations (11/05/22) Dysphagia 2 (Finely chopped); Thin liquids (Level 0) Liquid Administration via: Cup; Spoon; Straw Medication Administration: Crushed with puree Supervision: Full  supervision/cueing for swallowing strategies; Patient able to self-feed Objective swallow impairments: Clinical Impression: Pt presents with moderate oral and mild pharyngeal dysphagia. Oral phase characterized by reduced labial seal (resulting in anterior bolus escape beyond the mid chin with thin liquids from teaspoon), impaired/slowed mastication, slowed AP transit, and mild-moderate oral residue after the initial swallow. Patient sensate to residue and works to clear independently. Pharyngeally, deficits characterized by reduced pharyngeal constriction and reduced BOT retraction to the posterior pharyngeal wall resulting in trace-mild residue in the vallecula with all trialed consistencies. Both orally and pharyngeally, amount of residue increases alongside bolus viscosity. Patient continues to present with trismus that makes oral acceptance of larger bites of food difficult, though this is improving. No significant penetration/aspiration observed throughout all trials. Straw bolus acceptance minimized anterior bolus loss and adding an additional dry swallow after solids mitigated residue in the vallecula. Recommend diet upgrade to Dys2/thin liquid diet, adding additional swallow intermittently to clear residue. Family updated to results and in agreement with plan. Objective recommended compensations:  Swallowing strategies: Slow rate; Check for pocketing or oral holding; Check for anterior loss; Multiple dry swallows after each bite/sip Postural changes: Position pt fully upright for meals; Stay upright 30-60 min after meals Oral care recommendations: Oral care BID (2x/day)  PATIENT REPORTED OUTCOME MEASURES (PROM): EAT-10:   and Speech - QOL completed this session. EAT-10 was 21/40 with higher scores  indicating worse effect of swallow on QOL. Speech QOL score was 25/30 with higher scores indicating worse effect of speech on pt's QOL.    TODAY'S TREATMENT:                                                                                                                                          DATE:  04/09/23: Pt stated she, largely, follows her precautions at home but might rarely take too large of bites with solids and will cough - which reminds her to reduce bite size. SLP reiterated a good size was the size of a dice with solids. She performed HEP today with modified independence. She indicated she was comfortable continuing the HEP on her own - SLP told her to complete 6 days/week for 8 weeks, then twice a week after that. PROM final today: EAT-10 9/40, significant improvement over initial administration. SLP educated about importance of oral care BID and cont to ensure no residue in lt labial sulcus during meals. Pt states this occurs less now than a few weeks ago.  Speech: Pt demonstrated improved articulation today compared to iniital evaluation She completed Speech QOL PROM and scored 8/30, which confirms SLPs observation about improved articulation. Today pt indicated she was comfortable with continuing the HEP at home for 8 weeks. SLP also strongly urged pt to cont to do tasks which would stimulate the area such as frozen or brushing tactile stimulation - she has been doing this at home regularly.  03/19/23: Pt with MBS approx one week ago, results above. SLP interpreted these results for pt. SLP engaged pt with assistance in following her precautions with fig bar and water - req'd initial mod A for multiple swallows and for proper size bites however was independent upon session completion. Pt with lt labial leakage with cup sips x1/7.  02/20/23: Pt has progressed to soft foods - she is finding what foods don't work more by trial and error. Crumbly foods and vinegar sauces cause pt to cough at this time. MBS scheduled for later this month due to pt losing her insurance and when obtained that was earliest available. Today SLP assessed pt with fig bar and water with straw. Pt req'd occasional min A for  recall of sip of liqiud after solids. She tipped her bottle/straw upwards and had multiple sips and coughed. SLP suggested single sips with head to side instead of tilted back. SLP provided pt with dysphagia exercises (see pt instructions) and pt was independent by session end.   01/08/23: PROMs completed today. SLP addressed two more trismus exercises with pt today. She req'd min-mod A rarely, faded to modified independence. SLP and pt discussed rationale for swallow assessment (MBS) and pt agreed she needed this assessment completed.   12/31/22 (  eval): SLP educated/reviewed three labial exercises with pt and provided handout. SLP role in rehab process was explained. Reviewed pt's swallow strategies with her.  PATIENT EDUCATION: Education details: see "today's treatment" Person educated: Patient Education method: Explanation, Demonstration, Verbal cues, and Handouts Education comprehension: verbalized understanding, returned demonstration, verbal cues required, and needs further education   GOALS: Goals reviewed with patient? No  SHORT TERM GOALS: Target date: 02/01/23, 03/19/23, 03/29/23 (due to limited visits)  Pt will perform dysarthria HEP, and dysphagia (if necessary) HEP with rare min A in two sessions Baseline: 04/09/23 Goal status: partially met  2.  Pt will perform MBS to assess current swallow status Baseline:  Goal status: met  3.  Pt will follow swallow precautions from MBS with POs with rare min A in two sessions Baseline:  Goal status: Pt politely declined POs today  4.  Pt will improve articulation in 10 minutes conversation with compensatory strategies with rare min A Baseline:  Goal status: DEFERRED to work on swallowing   LONG TERM GOALS: Target date: 03/01/23, 04/19/23, 05/03/23 (due to extended STG date)  Pt will improve PROM compared to initial administration Baseline:  Goal status: Met  2.  Pt will improve articulation in 10 minutes conversation using  compensatory strategies Baseline:  Goal status: Met  3.  Pt will perform dysarthria HEP, and dysphagia (if necessary) HEP with modified independence in three sessions Baseline: 04/09/23 Goal status: partially met  4.  Pt will follow swallow precautions from MBS with POs with modified independence in 3 sessions Baseline:  Goal status: not met   ASSESSMENT:  CLINICAL IMPRESSION: Patient is a 63 y.o. F who was seen for swallowing and speech after a CVA in October 2024. Pt had follow up MBS last week. Pt is drinking with a straw and reports less lt labial sulcus residue than last session. She indicated today that she was comfortable with following precautions and HEP at home. SLP will d/c today. See "treatment today" for more details.   OBJECTIVE IMPAIRMENTS: include dysarthria and dysphagia. These impairments are limiting patient from ADLs/IADLs, effectively communicating at home and in community, and safety when swallowing. Factors affecting potential to achieve goals and functional outcome are severity of impairments and unknown prognosis for nerve recovery on lt side . Patient will benefit from skilled SLP services to address above impairments and improve overall function.  REHAB POTENTIAL: Good  PLAN: discharge today  PLANNED INTERVENTIONS: 92526 Treatment of swallowing function, 92507 Treatment of speech (30 or 45 min) , Re-evaluation, Aspiration precaution training, Pharyngeal strengthening exercises, Diet toleration management , Environmental controls, Trials of upgraded texture/liquids, Oral motor exercises, Functional tasks, Multimodal communication approach, SLP instruction and feedback, Compensatory strategies, Patient/family education, and MBS    Khalid Lacko, CCC-SLP 04/09/2023, 10:57 AM

## 2023-04-09 NOTE — Therapy (Signed)
 OUTPATIENT OCCUPATIONAL THERAPY NEURO  Treatment Note   Patient Name: Joan Robinson MRN: [id number] DOB:21-Oct-1960, 63 y.o., female Today's Date: 04/10/2023  PCP: April Manson, NP REFERRING PROVIDER: Micki Riley, MD  END OF SESSION:  OT End of Session - 04/09/23 1111     Visit Number 7    Number of Visits 17    Date for OT Re-Evaluation 06/04/23    Authorization Type [place] Medicaid-[employer] 2025 as of 03/16/23    Authorization Time Period 27 visits combined OT/PT/ST    Authorization - Visit Number 2    OT Start Time 1104    OT Stop Time 1144    OT Time Calculation (min) 40 min    Behavior During Therapy [facility] for tasks assessed/performed                   Past Medical History:  Diagnosis Date   Allergic rhinitis    Anemia 10/10/2011   Anxiety and depression 01/17/2007   Qualifier: Diagnosis of  By: Everardo All MD, Sean A    Arthritis 07/24/2013   Likely inflammatory and following with Dr Maryln Gottron of [facility]  rheumatology   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   BCC (basal cell carcinoma of skin) 06/01/2012   Leg Follows with Dr Margo Aye   Bipolar disorder (HCC) 01/17/2007   Qualifier: Diagnosis of  By: Everardo All MD, Sean A    Cataract    Diverticulosis    Diverticulosis    Emphysema of lung (HCC)    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   Gallstones    GERD (gastroesophageal reflux disease)    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   Hashimoto's disease    Hyperlipidemia    Hypertension    Hypothyroidism 08/24/2006   Qualifier: Diagnosis of  By: Everardo All MD, Sean A     IBS (irritable bowel syndrome) 07/27/2016   Obesity 11/01/2013   PUD (peptic ulcer disease)    Sleep apnea 04/27/2016   Tobacco abuse    Past Surgical History:  Procedure Laterality Date   ABDOMINAL HYSTERECTOMY     ABDOMINAL HYSTERECTOMY  01/15/2009   complete   COLONOSCOPY     DILATION AND CURETTAGE OF UTERUS  01/16/1983   EYE SURGERY  03/03/2014   Surgery on both eyes for epiretinal membrane (vitreous  peel)   Gated Spect wall motion stress cardiolite  11/05/2001   HIATAL HERNIA REPAIR N/A 07/12/2021   Procedure: LAPAROSCOPY W/ EXTENSIVE FOREGUT DISSECTION; PARTIAL STOMACH REDUCTION; GASTROSTOMY TUBE PLACEMENT; GASTROPEXY;  Surgeon: Luretha Murphy, MD;  Location: [facility];  Service: General;  Laterality: N/A;   LAPAROSCOPIC RIGHT HEMI COLECTOMY Right 10/16/2022   Procedure: LAPAROSCOPIC ASSISTED RIGHT HEMI COLECTOMY;  Surgeon: Andria Meuse, MD;  Location: [facility] OR;  Service: General;  Laterality: Right;   POLYPECTOMY     TUBAL LIGATION  01/15/1993   UTERINE SUSPENSION     mesh   VITRECTOMY Bilateral 03/03/2014   XI ROBOTIC ASSISTED HIATAL HERNIA REPAIR N/A 05/01/2021   Procedure: XI ROBOTIC ASSISTED TYPE III HIATAL HERNIA REPAIR WITH FUNDOPLICATION;  Surgeon: Luretha Murphy, MD;  Location: [facility];  Service: General;  Laterality: N/A;   Patient Active Problem List   Diagnosis Date Noted   Dysphagia 11/05/2022   Vestibular dizziness 11/05/2022   Malnutrition of moderate degree 11/01/2022   Acute stroke due to ischemia [facility]) 10/25/2022   CVA (cerebral vascular accident) (HCC) 10/25/2022   Protein-calorie malnutrition, severe 10/19/2022   Diverticulosis 10/14/2022   History of emphysema (HCC) 10/14/2022   SBO (  small bowel obstruction) (HCC) 10/13/2022   Aortic valve calcification 09/20/2022   Abdominal aortic atherosclerosis (HCC) 09/20/2022   Hiatal hernia 07/12/2021   Status post laparoscopic Nissen fundoplication 05/01/2021   IBS (irritable bowel syndrome) 07/27/2016   High-tone pelvic floor dysfunction 05/09/2016   Midline cystocele 05/09/2016   Vaginal atrophy 05/09/2016   Bladder prolapse, female, acquired 05/07/2016   Dystrophic nail 04/29/2016   Fatigue 04/27/2016   Sleep apnea 04/27/2016   UTI (urinary tract infection) 09/11/2015   Cataract cortical, senile, left 12/20/2014   Rectal lesion 10/10/2014   Other type of migraine without status migrainosus 05/06/2014    Dyspepsia 05/06/2014   Postmenopausal estrogen deficiency 05/06/2014   Screening for breast cancer 05/06/2014   Bilateral low-tension glaucoma, indeterminate stage 12/29/2013   High myopia, bilateral 12/29/2013   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   Obesity 11/01/2013   Eustachian tube dysfunction 10/08/2013   Otalgia of left ear 10/08/2013   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   Arthritis 07/24/2013   Anxiety attack 03/08/2013   Paresthesia 12/22/2012   Headache 08/17/2012   BCC (basal cell carcinoma of skin) 06/01/2012   Left arm pain 05/10/2012   Right knee pain 05/10/2012   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   ADD (attention deficit disorder) 04/13/2012   Perimenopausal 01/16/2012   Macular pucker of both eyes 10/18/2011   Vitreous degeneration 10/18/2011   Chronic anemia 10/10/2011   Preventative health care 10/10/2011   Urticaria, chronic 10/10/2011   Macular pucker, bilateral 10/10/2011   Lower back pain    DIVERTICULITIS-COLON 07/25/2009   Diverticulitis of colon 07/25/2009   Hyperlipidemia 06/09/2009   Essential hypertension 06/09/2009   History of esophageal stricture 09/06/2008   GERD (gastroesophageal reflux disease) 07/27/2008   Bipolar disorder (HCC) 01/17/2007   Hypothyroidism 08/24/2006   Allergic rhinitis 08/24/2006   URINARY INCONTINENCE 08/24/2006    ONSET DATE: 10/13/22  REFERRING DIAG: I63.9 (ICD-10-CM) - Brainstem stroke  THERAPY DIAG:  Hemiplegia and hemiparesis following cerebral infarction affecting left non-dominant side (HCC)  Other disturbances of skin sensation  Other lack of coordination  Muscle weakness (generalized)  Visuospatial deficit  Rationale for Evaluation and Treatment: Rehabilitation  SUBJECTIVE:   SUBJECTIVE STATEMENT: Pt reports being in bed for ~2 weeks due to being so sick with Covid.  Pt reports "it's taken a toll on me" and "trying to get my energy back".   Pt  reports going to see audiologist April 8 and seeing MD for botox in eyelid to aid in closing of eyelid.    Pt accompanied by: self and significant other (husband dropped her off)  PERTINENT HISTORY: admitted 9/28 with RUQ pain with SBO. 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 PMhx: HTN, HLD, peptic ulcer disease, diverticulitis, bipolar disorder, hypothyroidism, GERD, Hashimoto's disease.  PRECAUTIONS: Fall  WEIGHT BEARING RESTRICTIONS:  lifting restrictions: not to lift anything > gallon of milk for 2 weeks  PAIN:  Are you having pain? Yes: NPRS scale: 5-6/10 Pain location: lower back and R arm Pain description: constant throbbing, pulling sensation Aggravating factors: bending, lifting Relieving factors: rest  FALLS: Has patient fallen in last 6 months? Yes. Number of falls 1 fall prior to stroke and fell when out feeding the animals  LIVING ENVIRONMENT: Lives with: lives with their spouse and adult grandson with disabilities (dtr comes every other day to assist with shower) Lives in: House/apartment Stairs: Yes: External: 6 steps in front, 10 steps at  back steps; on left going up; was to have ramp built but this has not happened yet and is managing steps with assist from husband Has following equipment at home: Single point cane, Walker - 2 wheeled, Environmental consultant - 4 wheeled, Wheelchair (manual), shower chair, bed side commode, and Grab bars (next to toilet and 2 in the shower)  PLOF: Independent and Independent with basic ADLs  PATIENT GOALS: I want to get back to doing as much as I can.  OBJECTIVE:  Note: Objective measures were completed at Evaluation unless otherwise noted.  HAND DOMINANCE: Right  ADLs: Transfers/ambulation related to ADLs: w/c in the house or walking with RW when dtr is in the house, short distance walking from house to the car with cane and/or physical assistance from spouse Eating: husband  cutting foods, pt utilizing straw for drinking, and does spill drinks and food intermittently  Grooming: Mod I  UB Dressing: assist for fastening bra, to don shirt LB Dressing: assist for donning socks, has elastic laces for shoes but has been able to tie shoes Toileting: Supervision, spouse will assist intermittently Bathing: Supervision, dtr setup for soap and washing back and hair Tub Shower transfers: hand held assist when stepping in/out of shower Equipment: Shower seat with back, Grab bars, Walk in shower, bed side commode, Reacher, and Long handled sponge  IADLs: Light housekeeping: husband and dtr doing all Meal Prep: husband doing all the cooking Community mobility: not driving Medication management: dtr dispenses meds in pill box and utilizes alarm for reminders  Handwriting:  TBD - pt reports different but unsure if vision vs coordination  MOBILITY STATUS: Needs Assist: Supervision to CGA for mobility with RW, utilizing hand held assist when transferring to/from car and use of w/c in the home and community  POSTURE COMMENTS:  rounded shoulders  ACTIVITY TOLERANCE: Activity tolerance: diminished  FUNCTIONAL OUTCOME MEASURES: FOTO: 64, predicted 73 in 17 visits 02/20/23: 58, pt reports it's difficult to answer as "I am so right handed".    04/09/23 - PSFS 1.3   UPPER EXTREMITY ROM:    Active ROM Right eval Left eval  Shoulder flexion WFL 160*  Shoulder abduction [facility]   Shoulder adduction    Shoulder extension    Shoulder internal rotation [facility]  Shoulder external rotation [facility]  Elbow flexion [facility] WFL  Elbow extension WFL Slight bend  Wrist flexion    Wrist extension    Wrist ulnar deviation    Wrist radial deviation    Wrist pronation    Wrist supination    (Blank rows = not tested)  UPPER EXTREMITY MMT:   Grossly 4+/5 bilaterally  HAND FUNCTION: 02/20/23: R: 34# and L: 50# 04/09/23: R: 39# and L: 50#  COORDINATION: 9 Hole Peg test: Right: 25.87 sec;  Left: 49.78 sec Box and Blocks:  Right 57 blocks, Left 46 blocks 02/20/23: Box and blocks: Right: 46 blocks and Left: 44 blocks 02/20/23: 9 hole peg test: L: 40.62 sec   04/09/23: Box and blocks: Right: 58 blocks and Left: 52 blocks (hitting barrier 40-50% of time) 04/09/23: 9 hole peg test: Right: 24.56 sec and Left: 34.13 sec (pt with reports of LUE being "out in space" and noted decreased motor control and dropping x2 pegs)  SENSATION: Light touch: WFL Pt reports impaired sensation of hot/cold and pinch on RUE and face,   04/09/23: Pt reports still not feeling hold/cold on RUE but can feel "sensation" when holding hot items even with oven mit  COGNITION:  Overall cognitive status: Within functional limits for tasks assessed  VISION: Subjective report: "this eye (left) doesn't close.  I can see shadows/change in light" Baseline vision: Wears glasses all the time  VISION ASSESSMENT: Eye alignment: Impaired: Left eye lower lid severely droops, upper lid droops; L eye moves with R eye but no vision out of L eye  Ocular ROM: restricted on looking left and restricted on looking up Gaze preference/alignment: WDL Tracking/Visual pursuits: Decreased smoothness with vertical tracking Saccades: additional head turns occurred during testing Convergence: Impaired:   Visual Fields: Left visual field deficits Depth perception: Undershoots  Patient has difficulty with following activities due to following visual impairments: writing, reading  PRAXIS: Impaired: Motor planning; Ataxia on RUE  OBSERVATIONS: Pt well groomed and presented very knowledgeable about current status as well as medical history.  Pt limited in mobility in home due to reporting that her husband and adult grandson are both disabled and can provide intermittent assistance.   TODAY'S TREATMENT:                                                                              DATE:  04/09/23 Therapeutic activity: engaged in assessments  (see above) with pt demonstrating improvements in coordination and strength, however still demonstrating decreased motor control of LUE during fine and gross motor tasks.   Self-care: engaged in PSFS (see above) to focus on pt specific goals.  Engaged in conversation of typical recovery expectations as well as current progress towards goals.  Pt has been limited in progress secondary to decreased consistency with illness and change in insurance provider.  Educated on plan of care and focus of remaining visits to address motor control, sensation, coordination, and safety with leisure tasks.     03/19/23 Cards: engaged in bimanual task of shuffling with pt demonstrating difficulty due to arthritis and impaired coordination.  Pt then setting up and playing a game of solitaire with focus on visual spatial awareness, memory, sequencing, and coordination.  OT providing min-mod cues to facilitate increased scanning to R and L to attend to and recognize cards to play.  Hand Gripper: with RUE on level 30#, progressing to 35#. Pt picked up 1 inch blocks with gripper with 3 drops on 30# and 2 drops on 35#.  OT challenging pt to place blocks on targets based on matching color to incorporate visual scanning.   Pt with min-mod difficulty, especially as task continued due to decreased sustained grasp.  Attempted with L hand on 50#, pt completing 70% before terminating task due to increased pain in L wrist.   Connect 4: engaged in in-hand manipulation and translation of checker pieces from palm to finger tips to place into elevated Connect 4 grid to facilitate increased reach and motor coordination while also integrating visual scanning/attention.  Pt demonstrating good sequencing and manipulation of checker pieces in hand, benefiting from min cues for head turns to compensate for decreased attention to L visual field and task started on R (by therapist).      03/13/23 Self-care: educated on visit limit with Medicaid and  possibility of local pro-bono clinics from continued therapy as needed/appropriate.   Don/doff jacket: pt donned/doffed jacket both in sitting  and standing, donning R arm first and then L arm first with focus on quality of movement.  Pt able to don and doff jacket in both sitting and standing, donning alternating arm first.  OT providing cue to attend to task and "slow down" to ensure proper setup to increase ease. Energy conservation: educated on 4P's of energy conservation (planning, prioritizing, pacing, and positioning) and providing cues and examples for each.  Pt reports sitting when bathing, having someone bring a pile or box to her sitting in recliner and making piles as she is organizing and decluttering. Coordination: w/ LUE/RUEas appropriate, including: picking up 5-10 coins 1 at a time and placing into coin slot, and picking up 5-10 coins 1 at a time and translating from palm to finger tips to place into coin slot.  Pt demonstrating increased gross and fine motor ataxia on LUE > RUE.   9 hole peg test: 29.47 seconds.  Pt still demonstrating mild gross motor ataxia, but much improved fine motor control this session.    PATIENT EDUCATION: Education details: educated on visual attention and turning head for improved spatial awareness Person educated: Patient Education method: Explanation Education comprehension: verbalized understanding and needs further education  HOME EXERCISE PROGRAM: Access Code: [email or web address] URL: [email or web address] Date: 02/28/2023 Prepared by: [facility] - Outpatient  Rehab - [facility]  Exercises - Putty Squeezes  - 1 x daily - 10 reps - Rolling Putty on Table  - 1 x daily - 10 reps - Tip Pinch with Putty  - 1 x daily - 10 reps - 3-Point Pinch with Putty  - 1 x daily - 10 reps - Finger Pinch and Pull with Putty  - 1 x daily - 10 reps - Removing Marbles from Putty  - 1 x daily - 10 reps   GOALS: Goals reviewed with patient? Yes  NEW  SHORT TERM GOALS: Target date: 03/15/23  Pt will be Mod I with initial neuromuscular re-education HEP for R UE. Baseline: Goal status: MET - reports completing coordination and putty exercises "couple times a week" on 03/13/23  2.  Pt will be able to don/doff jacket with Supervision/setup utilizing adaptive strategies PRN. Baseline:  02/20/23: pt's husband still assisting with jacket, especially L side due to visual impairment Goal status: MET - 03/13/23  3.  Pt will verbalize understanding of compensatory strategies to accommodate for decreased vision in L visual field. Baseline:  02/20/23: pt reports still having difficulty, have not been able to truly address as pt has had large gap between visits  Goal status: IN PROGRESS  4.  Pt will verbalize understanding of energy conservation strategies and report 2 strategies being utilized during ADLs/IADLs to increase safety and independence. Baseline:  02/20/23: have not been able to fully address due to large gap between visits Goal status: MET - 03/13/23   LONG TERM GOALS: Target date: 04/05/23  Pt will demonstrate improved fine motor coordination for ADLs (clothing fasteners) as evidenced by decreasing 9 hole peg test score for LUE to < 35 seconds. Baseline: L: 49.78 sec on 01/08/23 02/20/23 - L: 40.62 sec  Goal status: MET - 29.47 sec on 03/13/23  2.  Pt will demonstrate improved motor control to safely retrieve a moderate weight object at from shelf at mod-high range.  Baseline:  Goal status: IN PROGRESS  3.  Pt will demonstrate improved grip strength as needed to open new/tight food containers. Baseline:  Goal status: MET - 5# improvement with RUE on 04/09/23  4.  Pt will demonstrate improved coordination and motor control with LUE during bimanual tasks requiring increased weight and/or force. Baseline:  Goal status: IN PROGRESS  NEW LONG TERM GOALS: Target date: 06/04/23  1.  Pt will demonstrate improved motor control to safely retrieve  a moderate weight object at from shelf at mod-high range.  Baseline: decreased gross and fine motor control Goal status: IN PROGRESS  2.  Pt will verbalize understanding of desensitization strategies and recall at least 3 sensory safety precautions for affected UE.  Baseline: impaired sensation to hot/cold as well as hypersensitivity to touch  Goal Status: INITIAL  3.  Pt will demonstrate improved UE functional use for ADLs as evidenced by increasing box/ blocks score by 4 blocks with LUE and reduction of hitting barrier to <25% of time Baseline: R: 58 blocks and L: 52 blocks - hitting barrier 40-50% of time Goal status: INITIAL  4.  Pt will demonstrate improved coordination and motor control with LUE during bimanual tasks with and without weight and/or force (donning socks and earrings, putting away dishes) Baseline: ataxia on LUE Goal status: IN PROGRESS  5.  Pt will verbalize understanding of adaptive strategies and/or techniques to allow for increased safety and independence with leisure tasks of gardening/working in flower beds.  Baseline: LUE ataxia, decreased balance  Goal status: INITIAL  6.  Patient will report at least two-point increase in average PSFS score or at least three-point increase in a single activity score indicating functionally significant improvement given minimum detectable change.  Baseline: PSFS 1.3  Goal status: INITIAL   ASSESSMENT:  CLINICAL IMPRESSION: Pt has met 2 of 4 LTGs, however continues to demonstrate LUE ataxia and impaired sensation on RUE.  Pt has been limited in progress secondary to decreased consistency with illness resulting in missed appts and lapse in visits due to change in insurance provider.  Engaged in lengthy discussion about plan of care and focus of remaining visits to address motor control, sensation, coordination, and safety with leisure tasks.  Pt in agreement with taking a 3-4 week break to allow for focus on hearing and vision as  they negatively impact pt's balance, with plan to resume therapy after upcoming MD visits for hearing and vision with plans to complete 1-2x/week for 4 weeks.  Pt will benefit from continued skilled occupational therapy services to address strength and coordination, ROM, pain management, altered sensation, balance, GM/FM control, safety awareness, introduction of compensatory strategies/AE prn, visual-perception, and implementation of an HEP to improve participation and safety during ADLs and IADLs.     PERFORMANCE DEFICITS: in functional skills including ADLs, IADLs, coordination, sensation, ROM, strength, pain, Fine motor control, Gross motor control, mobility, balance, body mechanics, endurance, decreased knowledge of precautions, decreased knowledge of use of DME, vision, and UE functional use and psychosocial skills including coping strategies, environmental adaptation, and routines and behaviors.     PLAN:  OT FREQUENCY: 1-2x/week  OT DURATION: 8 weeks (taking a 3-4 week break to allow for MD visits/intervention)  PLANNED INTERVENTIONS: 97168 OT Re-evaluation, 97535 self care/ADL training, 96045 therapeutic exercise, 97530 therapeutic activity, 97112 neuromuscular re-education, 97035 ultrasound, 97010 moist heat, 97010 cryotherapy, 97760 Orthotics management and training, balance training, functional mobility training, compression bandaging, visual/perceptual remediation/compensation, psychosocial skills training, energy conservation, coping strategies training, patient/family education, and DME and/or AE instructions  RECOMMENDED OTHER SERVICES: NA  CONSULTED AND AGREED WITH PLAN OF CARE: Patient  PLAN FOR NEXT SESSION: continue coordination, reaching, standing activities; educate on visual and sensation compensatory strategies  and desensitization  Engage in WB through BUE in standing at counter top or elevated mat to focus on ataxia/gross motor control prior to [facility], could do reaction  timer pods in WB if tolerated.   Rosalio Loud, OTR/L 04/10/2023, 8:32 AM   [facility] at [facility] [address] [place], [address] [place], [place] [address] Phone # [phone] Fax # [phone]

## 2023-04-09 NOTE — Therapy (Signed)
 OUTPATIENT PHYSICAL THERAPY NEURO TREATMENT and Recertification   Patient Name: Joan Robinson MRN: [id number] DOB:10/13/1960, 63 y.o., female Today's Date: 04/09/2023   PCP: April Manson, NP REFERRING PROVIDER: Micki Riley, MD   END OF SESSION:  PT End of Session - 04/09/23 1151     Visit Number 7    Number of Visits 13    Date for PT Re-Evaluation 05/28/23    Authorization Type [place] Medicaid UHC    Authorization - Number of Visits 10   30 combined   PT Start Time 1145    PT Stop Time 1230    PT Time Calculation (min) 45 min    Equipment Utilized During Treatment Gait belt    Activity Tolerance Patient tolerated treatment well    Behavior During Therapy WFL for tasks assessed/performed               Past Medical History:  Diagnosis Date   Allergic rhinitis    Anemia 10/10/2011   Anxiety and depression 01/17/2007   Qualifier: Diagnosis of  By: Everardo All MD, Sean A    Arthritis 07/24/2013   Likely inflammatory and following with Dr Maryln Gottron of [facility]  rheumatology   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   BCC (basal cell carcinoma of skin) 06/01/2012   Leg Follows with Dr Margo Aye   Bipolar disorder (HCC) 01/17/2007   Qualifier: Diagnosis of  By: Everardo All MD, Sean A    Cataract    Diverticulosis    Diverticulosis    Emphysema of lung (HCC)    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   Gallstones    GERD (gastroesophageal reflux disease)    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   Hashimoto's disease    Hyperlipidemia    Hypertension    Hypothyroidism 08/24/2006   Qualifier: Diagnosis of  By: Everardo All MD, Sean A     IBS (irritable bowel syndrome) 07/27/2016   Obesity 11/01/2013   PUD (peptic ulcer disease)    Sleep apnea 04/27/2016   Tobacco abuse    Past Surgical History:  Procedure Laterality Date   ABDOMINAL HYSTERECTOMY     ABDOMINAL HYSTERECTOMY  01/15/2009   complete   COLONOSCOPY     DILATION AND CURETTAGE OF UTERUS  01/16/1983   EYE SURGERY  03/03/2014    Surgery on both eyes for epiretinal membrane (vitreous peel)   Gated Spect wall motion stress cardiolite  11/05/2001   HIATAL HERNIA REPAIR N/A 07/12/2021   Procedure: LAPAROSCOPY W/ EXTENSIVE FOREGUT DISSECTION; PARTIAL STOMACH REDUCTION; GASTROSTOMY TUBE PLACEMENT; GASTROPEXY;  Surgeon: Luretha Murphy, MD;  Location: [facility];  Service: General;  Laterality: N/A;   LAPAROSCOPIC RIGHT HEMI COLECTOMY Right 10/16/2022   Procedure: LAPAROSCOPIC ASSISTED RIGHT HEMI COLECTOMY;  Surgeon: Andria Meuse, MD;  Location: [facility];  Service: General;  Laterality: Right;   POLYPECTOMY     TUBAL LIGATION  01/15/1993   UTERINE SUSPENSION     mesh   VITRECTOMY Bilateral 03/03/2014   XI ROBOTIC ASSISTED HIATAL HERNIA REPAIR N/A 05/01/2021   Procedure: XI ROBOTIC ASSISTED TYPE III HIATAL HERNIA REPAIR WITH FUNDOPLICATION;  Surgeon: Luretha Murphy, MD;  Location: [facility];  Service: General;  Laterality: N/A;   Patient Active Problem List   Diagnosis Date Noted   Dysphagia 11/05/2022   Vestibular dizziness 11/05/2022   Malnutrition of moderate degree 11/01/2022   Acute stroke due to ischemia [facility]) 10/25/2022   CVA (cerebral vascular accident) (HCC) 10/25/2022   Protein-calorie malnutrition, severe 10/19/2022   Diverticulosis 10/14/2022   History  of emphysema (HCC) 10/14/2022   SBO (small bowel obstruction) (HCC) 10/13/2022   Aortic valve calcification 09/20/2022   Abdominal aortic atherosclerosis (HCC) 09/20/2022   Hiatal hernia 07/12/2021   Status post laparoscopic Nissen fundoplication 05/01/2021   IBS (irritable bowel syndrome) 07/27/2016   High-tone pelvic floor dysfunction 05/09/2016   Midline cystocele 05/09/2016   Vaginal atrophy 05/09/2016   Bladder prolapse, female, acquired 05/07/2016   Dystrophic nail 04/29/2016   Fatigue 04/27/2016   Sleep apnea 04/27/2016   UTI (urinary tract infection) 09/11/2015   Cataract cortical, senile, left 12/20/2014   Rectal lesion 10/10/2014   Other type  of migraine without status migrainosus 05/06/2014   Dyspepsia 05/06/2014   Postmenopausal estrogen deficiency 05/06/2014   Screening for breast cancer 05/06/2014   Bilateral low-tension glaucoma, indeterminate stage 12/29/2013   High myopia, bilateral 12/29/2013   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   Obesity 11/01/2013   Eustachian tube dysfunction 10/08/2013   Otalgia of left ear 10/08/2013   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   Arthritis 07/24/2013   Anxiety attack 03/08/2013   Paresthesia 12/22/2012   Headache 08/17/2012   BCC (basal cell carcinoma of skin) 06/01/2012   Left arm pain 05/10/2012   Right knee pain 05/10/2012   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   ADD (attention deficit disorder) 04/13/2012   Perimenopausal 01/16/2012   Macular pucker of both eyes 10/18/2011   Vitreous degeneration 10/18/2011   Chronic anemia 10/10/2011   Preventative health care 10/10/2011   Urticaria, chronic 10/10/2011   Macular pucker, bilateral 10/10/2011   Lower back pain    DIVERTICULITIS-COLON 07/25/2009   Diverticulitis of colon 07/25/2009   Hyperlipidemia 06/09/2009   Essential hypertension 06/09/2009   History of esophageal stricture 09/06/2008   GERD (gastroesophageal reflux disease) 07/27/2008   Bipolar disorder (HCC) 01/17/2007   Hypothyroidism 08/24/2006   Allergic rhinitis 08/24/2006   URINARY INCONTINENCE 08/24/2006    ONSET DATE: 10/13/2022  REFERRING DIAG: I63.9 (ICD-10-CM) - Brainstem stroke (HCC)   THERAPY DIAG:  Unsteadiness on feet  Other abnormalities of gait and mobility  Muscle weakness (generalized)  Hemiplegia and hemiparesis following cerebral infarction affecting left non-dominant side (HCC)  Rationale for Evaluation and Treatment: Rehabilitation  SUBJECTIVE:                                                                                                                                                                                              SUBJECTIVE STATEMENT: Patient reports recent COVID illness x 2 weeks with acute illness. Notes feeling weaker as result and more off balanced  Pt accompanied by: self  PERTINENT HISTORY: Per Dr. Pearlean Brownie office note 12/11/2022:  PMH of esophageal strictures status post dilation, multiple hernias status post multiple surgeries, GERD/PUD, hypertension, hyperlipidemia, autoimmune urticaria, emphysema, sleep apnea who presents to the ED on 10/13/2022 with right upper quadrant and right lower quadrant abdominal pain x 2 days associated nausea.  She was found to have small bowel obstruction and was being managed inpatient at [facility].  Around shift change, patient was noted to have left facial droop, slurring of her speech and upon further questioning, symptoms were first noted at 1700 on 10/14/2022.  Her team had difficulty establishing a last known well.  However, symptoms were first noted at 1700.  A code stroke was activated if she had a CT head without contrast which was notable for a moderately large left PICA territory infarct.  She was eval by teleneurology.  She was not given TNKase due to unclear last known well and the fact that the stroke looks completed.  CT angio head and neck with and without contrast was negative for any large vessel occlusion.  Specifically, no basilar occlusion or thrombosis noted.  CT perfusion with no core or mismatch noted.  Given the moderately large posterior fossa stroke which is in proximity to brainstem, and the extension of the stroke into the brainstem, she was transferred to [facility] medical ICU for immediate in person neurology evaluation and for close monitoring for signs of posterior fossa crowding and potential herniation.  She was monitored carefully in the ICU and remained stable and did not require any neurosurgical intervention.  MRI showed a large acute infarct involving both left PICA and Icar territory with  mass effect on posterior fossa and fourth ventricle mild hydrocephalus.  2D echo showed ejection fraction of 60 to 65%.  Left atrial size was normal.  LDL cholesterol 54 mg percent.  Hemoglobin A1c was 5.6.  Patient had significant left peripheral facial nerve weakness as well as dysphagia and left hemiataxia.  She was seen by physical Occupational Therapy transfer to inpatient rehab where she stayed for a month.  She made gradual improvement in his been home now for nearly 4 weeks.  She still has significant left facial weakness with dryness of the left eye with redness.  She is unable to close her left eye.  She does have an upcoming appointment to see ophthalmologist next week.  She is still not gotten any hearing back in the left ear.  She still has trouble swallowing has to swallow mostly on the right side.  She has a lot of drooling from the left corner of the mouth.  She still has some left-sided incoordination and she is working with therapist has shown some improvement.  She is still unable to walk on her own and requires 1 person assist with gait belt to walk with a wheelchair.  She is tolerating Plavix with minor bruising but no bleeding.  Patient is clearly significantly disabled and is currently on short-term disability and thinking about going on a long disability.  Patient also has a new complaint of neck pain and right arm numbness.  Her primary care physician has ordered an MRI of the C-spine is scheduled for next week for this.  Last CT head on 10/28/2022 had shown decrease mass effect in the posterior fossa with improved patency of the fourth ventricle.    PAIN:  Are you having pain?  5/10 low back-central  PRECAUTIONS: Fall and Other: vision changes    Had L eye  surgery and sees surgeon tomorrow. Cornea specialist next week. Reports double vision if she uncovers the L eye. 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 Pt reports no lifting >gallon of milk for 2  weeks(pt reports this has been lifted)  RED FLAGS: None   WEIGHT BEARING RESTRICTIONS: No  FALLS: Has patient fallen in last 6 months? No  LIVING ENVIRONMENT: Lives with: lives with their family and daughter comes over to help as husband can't be primary caregiver Lives in: House/apartment Stairs:  6 steps to front of home with L rail ascending Has following equipment at home: Single point cane, Environmental consultant - 2 wheeled, Environmental consultant - 4 wheeled, and Wheelchair (manual)  PLOF: Independent; enjoyed gardening, office work; took care of family  PATIENT GOALS: Pt's goal is to get back to being better to help with my family.  OBJECTIVE:   TODAY'S TREATMENT: 04/09/23 Activity Comments  LTG performance and review   1,035 ft, RPE 5/10 see below  Self-care Discussed safety awareness strategies and use of AD in household that she frequents to improve carryover of device. Discussion of relevant physical activities for improved performance at home and strategies for improving endurance such as seated there ex when watching TV               [facility] PT Assessment - 04/09/23 0001       6 Minute Walk- Baseline   6 Minute Walk- Baseline yes    BP (mmHg) 133/83    HR (bpm) 58    02 Sat (%RA) 96 %    Modified Borg Scale for Dyspnea 0- Nothing at all      6 Minute walk- Post Test   6 Minute Walk Post Test yes    BP (mmHg) 156/89    HR (bpm) 84    02 Sat (%RA) 100 %    Modified Borg Scale for Dyspnea 5- Strong or hard breathing      6 minute walk test results    Aerobic Endurance Distance Walked 1035      Berg Balance Test   Sit to Stand Able to stand without using hands and stabilize independently    Standing Unsupported Able to stand safely 2 minutes    Sitting with Back Unsupported but Feet Supported on Floor or Stool Able to sit safely and securely 2 minutes    Stand to Sit Sits safely with minimal use of hands    Transfers Able to transfer safely, minor use of hands    Standing  Unsupported with Eyes Closed Able to stand 10 seconds safely    Standing Unsupported with Feet Together Able to place feet together independently and stand 1 minute safely    From Standing, Reach Forward with Outstretched Arm Can reach confidently >25 cm (10")    From Standing Position, Pick up Object from Floor Able to pick up shoe safely and easily    From Standing Position, Turn to Look Behind Over each Shoulder Looks behind one side only/other side shows less weight shift    Turn 360 Degrees Able to turn 360 degrees safely but slowly    Standing Unsupported, Alternately Place Feet on Step/Stool Able to stand independently and safely and complete 8 steps in 20 seconds    Standing Unsupported, One Foot in Front Able to take small step independently and hold 30 seconds    Standing on One Leg Able to lift leg independently and hold equal to  or more than 3 seconds    Total Score 49               PATIENT EDUCATION: Education details: edu on exam findings and progress/remaining deficits towards goals, answered pt's questions about safety with [phone] and how to increase walking endurance; advised to try walking for time inside with 4WW first to test endurance before trying to walk outside  Person educated: Patient Education method: Explanation Education comprehension: verbalized understanding     HOME EXERCISE PROGRAM Last updated: 01/08/23 Access Code: [email or web address] URL: [email or web address] Date: 01/08/2023 Prepared by: [facility] - Outpatient  Rehab - [facility]  Exercises - Sit to Stand Without Arm Support  - 1 x daily - 5 x weekly - 2 sets - 10 reps - Heel Toe Raises with Counter Support  - 1 x daily - 5 x weekly - 2 sets - 10 reps - Alternating Step Forward with Support  - 1 x daily - 5 x weekly - 2 sets - 10 reps - Sitting Knee Extension with Resistance  - 1 x daily - 5 x weekly - 2 sets - 10 reps - Seated Hamstring Curl with Anchored Resistance  - 1 x daily - 5  x weekly - 2 sets - 10 reps - Supine Sciatic Nerve Glide  - 1 x daily - 7 x weekly - 3 sets - 10 reps - Supine Posterior Pelvic Tilt  - 1 x daily - 7 x weekly - 3 sets - 10 reps    --------------------------------------------------------------- Note: Objective measures below were completed at Evaluation unless otherwise noted.  DIAGNOSTIC FINDINGS: See above from CVA; MRI upcoming for RUE numbness and cervical pain  COGNITION: Overall cognitive status: Within functional limits for tasks assessed   SENSATION: Light touch: WFL except for R hand not being able to discern hot/cold  COORDINATION: Slowed alt toe taps, slowed heel to shin (R more than L)  MUSCLE TONE: LLE: Mild  POSTURE: rounded shoulders and forward head Sits in posterior pelvic tilt  LOWER EXTREMITY ROM:   AROM WFL BLEs  LOWER EXTREMITY MMT:    MMT Right Eval Left Eval  Hip flexion 4 4+  Hip extension    Hip abduction 4+ 4+  Hip adduction 4+ 4+  Hip internal rotation    Hip external rotation    Knee flexion 4 4+  Knee extension 4 4+  Ankle dorsiflexion 4+ 4+  Ankle plantarflexion    Ankle inversion 4 4  Ankle eversion 4 4  (Blank rows = not tested)   TRANSFERS: Assistive device utilized: None  Sit to stand: SBA Stand to sit: SBA  GAIT: Gait pattern: step to pattern, step through pattern, decreased step length- Right, decreased step length- Left, and narrow BOS Distance walked: 35 ft x 2 Assistive device utilized: Walker - 2 wheeled Level of assistance: CGA and Min A Comments: 38.54 sec 20 ft (0.52 ft/sec)  FUNCTIONAL TESTS:  5 times sit to stand: 27.75 sec with Ues; performs sit<>stand x 1 with arms crossed at chest Timed up and go (TUG): 45.94 sec with RW  PATIENT SURVEYS:  FOTO 56; predicted 67    GOALS: Goals reviewed with patient? Yes  SHORT TERM GOALS: Target date: 02/01/2023  Pt will be independent with HEP for improved strength, balance, functional mobility. Baseline: NT  02/20/23 Goal status: IN PROGRESS  2.  Pt will improve 5x sit<>stand to less than or equal to 20 sec to demonstrate improved functional strength and transfer efficiency.  Baseline: 27.75 sec with BUE support; 12.54 sec without UEs and decreased eccentric control 02/20/23 Goal status: MET 02/20/23  3.  Pt will improve TUG score to less than or equal to 35 sec for decreased fall risk. Baseline: 23.42 sec 02/20/23 Goal status: MET 02/20/23  4.  Berg score to improve by 7 points from baseline, for decreased fall risk. Baseline: 37>45 02/20/23 Goal status:MET 02/20/23  5.  Pt will ambulate at least 500 ft outdoor community surfaces, with RW, supervision.  Baseline:  RW with CGA/min; supervision  Goals status: MET     LONG TERM GOALS: Target date: 05/28/23    Pt will be independent with HEP for improved strength, balance, gait. Baseline:   Goal status: IN PROGRESS  2.  Pt will improve 5x sit<>stand to less than or equal to 12.5 sec to demonstrate improved functional strength and transfer efficiency. Baseline: 12.5 sec 02/20/23 Goal status:MET 02/20/23  3.  Pt will improve TUG score to less than or equal to 20 sec for decreased fall risk. Baseline: 23.42 sec 02/20/23; (04/09/23) 16 sec w/ RW Goal status: IN PROGRESS   4.  Pt will improve gait velocity to at least 1.3 ft/sec for improved gait efficiency and safety.  Baseline: 1.53 ft/sec 02/20/23 Goal status:MET 02/20/23  5.  Pt will ambulate 200 ft, household and short community distances, mod I, with least restrictive assistive device (cane vs. Dan Humphreys) Baseline:  Supervision w/ RW or rollator Goal status: IN PROGRESS  6.  FOTO score to improve to 67 to demo improved overall functional mobility.  Baseline:  56; (04/09/23) 58  Goal status:  IN PROGRESS  7. Patient to score at least 50/56 on Berg in order to decrease falls risk.  Baseline:  45 02/20/23: (04/09/23) 49/56  Goal status:  IN PROGRESS   7. Patient to report ability to access local community  such as church and grocery store with LRAD.  Baseline:  reports using w/c outside of house 02/20/23; (04/09/23) reports going to church w/ RW  Goal status:  IN PROGRESS  6.  Demonstrate improved endurance for community mobility per distance 1,200 ft during  Baseline: 1,035 ft 5/10 RPE  Goal status: INITIAL  ASSESSMENT:  CLINICAL IMPRESSION: Returns to clinic following recent acute illness.  Pt reports ongoing mobility deficits and limited safety awareness as she reports ambulating in household without AD and often relying on furniture or walls for reactive balance to support due to LOB.  Demo improved performance Berg Balance Test with score from 45 to 49/56 indicating low risk for falls.  Slight improvement to reported subjective limitations per FOTO score 58 (improved from 56).  Ongoing need for supervision during ambulation evident due to deviations affecting LLE > RLE and again her decreased safety awareness with mobility related tasks.  Demonstrates reduced ambulation tolerance per distance and RPE of with worsening gait mechanics with onset of fatigue requiring SBA-CGA for steadying support.  Patient would benefit from ongoing PT Services to improve functional independence and safety to reduce risk for falls.   OBJECTIVE IMPAIRMENTS: Abnormal gait, decreased balance, decreased mobility, difficulty walking, decreased strength, impaired tone, impaired vision/preception, and postural dysfunction.   ACTIVITY LIMITATIONS: carrying, lifting, standing, squatting, stairs, transfers, bathing, toileting, dressing, reach over head, hygiene/grooming, locomotion level, and caring for others  PARTICIPATION LIMITATIONS: meal prep, cleaning, laundry, interpersonal relationship, driving, shopping, community activity, occupation, and church  PERSONAL FACTORS: 3+ comorbidities: See PMH above  are also affecting patient's functional outcome.   REHAB  POTENTIAL: Good  CLINICAL DECISION MAKING:  Evolving/moderate complexity  EVALUATION COMPLEXITY: Moderate  PLAN:  PT FREQUENCY: 1x/week for 6 weeks  PT DURATION: other: -  PLANNED INTERVENTIONS: 97110-Therapeutic exercises, 97530- Therapeutic activity, 97112- Neuromuscular re-education, 97535- Self Care, [id number]- Manual therapy, [phone]- Gait training, [phone]- Orthotic Fit/training, [phone]- Aquatic Therapy, Patient/Family education, Balance training, Stair training, DME instructions, and Wheelchair mobility training  PLAN FOR NEXT SESSION: Gait with U-step and tensioned wheels for gait training more narrow BOS     12:37 PM, 04/09/23 M. Shary Decamp, PT, DPT Physical Therapist- [place] Office Number: [phone]

## 2023-04-11 ENCOUNTER — Ambulatory Visit: Payer: Commercial Managed Care - HMO

## 2023-04-11 ENCOUNTER — Encounter: Payer: Commercial Managed Care - HMO | Admitting: Occupational Therapy

## 2023-04-12 ENCOUNTER — Ambulatory Visit (HOSPITAL_COMMUNITY)
Admission: RE | Admit: 2023-04-12 | Discharge: 2023-04-12 | Disposition: A | Discharge status: Home / Self Care | Source: Ambulatory Visit | Attending: Neurology | Admitting: Neurology

## 2023-04-12 DIAGNOSIS — I639 Cerebral infarction, unspecified: Secondary | ICD-10-CM | POA: Insufficient documentation

## 2023-04-16 ENCOUNTER — Ambulatory Visit: Attending: Neurology

## 2023-04-16 DIAGNOSIS — M6281 Muscle weakness (generalized): Secondary | ICD-10-CM

## 2023-04-16 DIAGNOSIS — I69354 Hemiplegia and hemiparesis following cerebral infarction affecting left non-dominant side: Secondary | ICD-10-CM | POA: Diagnosis present

## 2023-04-16 DIAGNOSIS — I63532 Cerebral infarction due to unspecified occlusion or stenosis of left posterior cerebral artery: Secondary | ICD-10-CM | POA: Diagnosis present

## 2023-04-16 DIAGNOSIS — R41842 Visuospatial deficit: Secondary | ICD-10-CM | POA: Diagnosis present

## 2023-04-16 DIAGNOSIS — R2681 Unsteadiness on feet: Secondary | ICD-10-CM | POA: Diagnosis present

## 2023-04-16 DIAGNOSIS — R278 Other lack of coordination: Secondary | ICD-10-CM | POA: Diagnosis present

## 2023-04-16 DIAGNOSIS — R2689 Other abnormalities of gait and mobility: Secondary | ICD-10-CM | POA: Diagnosis present

## 2023-04-16 NOTE — Therapy (Signed)
 OUTPATIENT PHYSICAL THERAPY NEURO TREATMENT   Patient Name: Joan Robinson MRN: [id number] DOB:[birth year], 63 y.o., female Today's Date: 04/16/2023   PCP: April Manson, NP REFERRING PROVIDER: Micki Riley, MD   END OF SESSION:  PT End of Session - 04/16/23 1058     Visit Number 8    Number of Visits 13    Date for PT Re-Evaluation 05/28/23    Authorization Type Post Medicaid UHC    Authorization - Number of Visits 10   30 combined   PT Start Time 1100    PT Stop Time 1145    PT Time Calculation (min) 45 min    Equipment Utilized During Treatment Gait belt    Activity Tolerance Patient tolerated treatment well    Behavior During Therapy WFL for tasks assessed/performed               Past Medical History:  Diagnosis Date   Allergic rhinitis    Anemia 10/10/2011   Anxiety and depression 01/17/2007   Qualifier: Diagnosis of  By: Everardo All MD, Sean A    Arthritis 07/24/2013   Likely inflammatory and following with Dr Maryln Gottron of [facility]  rheumatology   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   BCC (basal cell carcinoma of skin) 06/01/2012   Leg Follows with Dr Margo Aye   Bipolar disorder (HCC) 01/17/2007   Qualifier: Diagnosis of  By: Everardo All MD, Sean A    Cataract    Diverticulosis    Diverticulosis    Emphysema of lung (HCC)    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   Gallstones    GERD (gastroesophageal reflux disease)    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   Hashimoto's disease    Hyperlipidemia    Hypertension    Hypothyroidism 08/24/2006   Qualifier: Diagnosis of  By: Everardo All MD, Sean A     IBS (irritable bowel syndrome) 07/27/2016   Obesity 11/01/2013   PUD (peptic ulcer disease)    Sleep apnea 04/27/2016   Tobacco abuse    Past Surgical History:  Procedure Laterality Date   ABDOMINAL HYSTERECTOMY     ABDOMINAL HYSTERECTOMY  01/15/2009   complete   COLONOSCOPY     DILATION AND CURETTAGE OF UTERUS  01/16/1983   EYE SURGERY  03/03/2014   Surgery on both eyes for  epiretinal membrane (vitreous peel)   Gated Spect wall motion stress cardiolite  11/05/2001   HIATAL HERNIA REPAIR N/A 07/12/2021   Procedure: LAPAROSCOPY W/ EXTENSIVE FOREGUT DISSECTION; PARTIAL STOMACH REDUCTION; GASTROSTOMY TUBE PLACEMENT; GASTROPEXY;  Surgeon: Luretha Murphy, MD;  Location: [facility];  Service: General;  Laterality: N/A;   LAPAROSCOPIC RIGHT HEMI COLECTOMY Right 10/16/2022   Procedure: LAPAROSCOPIC ASSISTED RIGHT HEMI COLECTOMY;  Surgeon: Andria Meuse, MD;  Location: [facility];  Service: General;  Laterality: Right;   POLYPECTOMY     TUBAL LIGATION  01/15/1993   UTERINE SUSPENSION     mesh   VITRECTOMY Bilateral 03/03/2014   XI ROBOTIC ASSISTED HIATAL HERNIA REPAIR N/A 05/01/2021   Procedure: XI ROBOTIC ASSISTED TYPE III HIATAL HERNIA REPAIR WITH FUNDOPLICATION;  Surgeon: Luretha Murphy, MD;  Location: [facility];  Service: General;  Laterality: N/A;   Patient Active Problem List   Diagnosis Date Noted   Dysphagia 11/05/2022   Vestibular dizziness 11/05/2022   Malnutrition of moderate degree 11/01/2022   Acute stroke due to ischemia [facility]) 10/25/2022   CVA (cerebral vascular accident) (HCC) 10/25/2022   Protein-calorie malnutrition, severe 10/19/2022   Diverticulosis 10/14/2022   History of emphysema (  HCC) 10/14/2022   SBO (small bowel obstruction) (HCC) 10/13/2022   Aortic valve calcification 09/20/2022   Abdominal aortic atherosclerosis (HCC) 09/20/2022   Hiatal hernia 07/12/2021   Status post laparoscopic Nissen fundoplication 05/01/2021   IBS (irritable bowel syndrome) 07/27/2016   High-tone pelvic floor dysfunction 05/09/2016   Midline cystocele 05/09/2016   Vaginal atrophy 05/09/2016   Bladder prolapse, female, acquired 05/07/2016   Dystrophic nail 04/29/2016   Fatigue 04/27/2016   Sleep apnea 04/27/2016   UTI (urinary tract infection) 09/11/2015   Cataract cortical, senile, left 12/20/2014   Rectal lesion 10/10/2014   Other type of migraine without  status migrainosus 05/06/2014   Dyspepsia 05/06/2014   Postmenopausal estrogen deficiency 05/06/2014   Screening for breast cancer 05/06/2014   Bilateral low-tension glaucoma, indeterminate stage 12/29/2013   High myopia, bilateral 12/29/2013   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   Obesity 11/01/2013   Eustachian tube dysfunction 10/08/2013   Otalgia of left ear 10/08/2013   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   Arthritis 07/24/2013   Anxiety attack 03/08/2013   Paresthesia 12/22/2012   Headache 08/17/2012   BCC (basal cell carcinoma of skin) 06/01/2012   Left arm pain 05/10/2012   Right knee pain 05/10/2012   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   ADD (attention deficit disorder) 04/13/2012   Perimenopausal 01/16/2012   Macular pucker of both eyes 10/18/2011   Vitreous degeneration 10/18/2011   Chronic anemia 10/10/2011   Preventative health care 10/10/2011   Urticaria, chronic 10/10/2011   Macular pucker, bilateral 10/10/2011   Lower back pain    DIVERTICULITIS-COLON 07/25/2009   Diverticulitis of colon 07/25/2009   Hyperlipidemia 06/09/2009   Essential hypertension 06/09/2009   History of esophageal stricture 09/06/2008   GERD (gastroesophageal reflux disease) 07/27/2008   Bipolar disorder (HCC) 01/17/2007   Hypothyroidism 08/24/2006   Allergic rhinitis 08/24/2006   URINARY INCONTINENCE 08/24/2006    ONSET DATE: 10/13/2022  REFERRING DIAG: I63.9 (ICD-10-CM) - Brainstem stroke (HCC)   THERAPY DIAG:  Hemiplegia and hemiparesis following cerebral infarction affecting left non-dominant side (HCC)  Muscle weakness (generalized)  Unsteadiness on feet  Other abnormalities of gait and mobility  Cerebrovascular accident (CVA) due to stenosis of left posterior cerebral artery (HCC)  Rationale for Evaluation and Treatment: Rehabilitation  SUBJECTIVE:                                                                                                                                                                                              SUBJECTIVE STATEMENT: Having difficulty with walking outside in the yard  Pt accompanied by: self  PERTINENT HISTORY: Per Dr. Pearlean Brownie office note 12/11/2022:  PMH of esophageal strictures status post dilation, multiple hernias status post multiple surgeries, GERD/PUD, hypertension, hyperlipidemia, autoimmune urticaria, emphysema, sleep apnea who presents to the ED on 10/13/2022 with right upper quadrant and right lower quadrant abdominal pain x 2 days associated nausea.  She was found to have small bowel obstruction and was being managed inpatient at [facility].  Around shift change, patient was noted to have left facial droop, slurring of her speech and upon further questioning, symptoms were first noted at 1700 on 10/14/2022.  Her team had difficulty establishing a last known well.  However, symptoms were first noted at 1700.  A code stroke was activated if she had a CT head without contrast which was notable for a moderately large left PICA territory infarct.  She was eval by teleneurology.  She was not given TNKase due to unclear last known well and the fact that the stroke looks completed.  CT angio head and neck with and without contrast was negative for any large vessel occlusion.  Specifically, no basilar occlusion or thrombosis noted.  CT perfusion with no core or mismatch noted.  Given the moderately large posterior fossa stroke which is in proximity to brainstem, and the extension of the stroke into the brainstem, she was transferred to [facility] medical ICU for immediate in person neurology evaluation and for close monitoring for signs of posterior fossa crowding and potential herniation.  She was monitored carefully in the ICU and remained stable and did not require any neurosurgical intervention.  MRI showed a large acute infarct involving both left PICA and Icar territory with  mass effect on posterior fossa and fourth ventricle mild hydrocephalus.  2D echo showed ejection fraction of 60 to 65%.  Left atrial size was normal.  LDL cholesterol 54 mg percent.  Hemoglobin A1c was 5.6.  Patient had significant left peripheral facial nerve weakness as well as dysphagia and left hemiataxia.  She was seen by physical Occupational Therapy transfer to inpatient rehab where she stayed for a month.  She made gradual improvement in his been home now for nearly 4 weeks.  She still has significant left facial weakness with dryness of the left eye with redness.  She is unable to close her left eye.  She does have an upcoming appointment to see ophthalmologist next week.  She is still not gotten any hearing back in the left ear.  She still has trouble swallowing has to swallow mostly on the right side.  She has a lot of drooling from the left corner of the mouth.  She still has some left-sided incoordination and she is working with therapist has shown some improvement.  She is still unable to walk on her own and requires 1 person assist with gait belt to walk with a wheelchair.  She is tolerating Plavix with minor bruising but no bleeding.  Patient is clearly significantly disabled and is currently on short-term disability and thinking about going on a long disability.  Patient also has a new complaint of neck pain and right arm numbness.  Her primary care physician has ordered an MRI of the C-spine is scheduled for next week for this.  Last CT head on 10/28/2022 had shown decrease mass effect in the posterior fossa with improved patency of the fourth ventricle.    PAIN:  Are you having pain?  1/10 low back-central  PRECAUTIONS: Fall and Other: vision changes    Had L eye  surgery and sees surgeon tomorrow. Cornea specialist next week. Reports double vision if she uncovers the L eye. 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 Pt reports no lifting >gallon of milk for 2  weeks(pt reports this has been lifted)  RED FLAGS: None   WEIGHT BEARING RESTRICTIONS: No  FALLS: Has patient fallen in last 6 months? No  LIVING ENVIRONMENT: Lives with: lives with their family and daughter comes over to help as husband can't be primary caregiver Lives in: House/apartment Stairs:  6 steps to front of home with L rail ascending Has following equipment at home: Single point cane, Environmental consultant - 2 wheeled, Environmental consultant - 4 wheeled, and Wheelchair (manual)  PLOF: Independent; enjoyed gardening, office work; took care of family  PATIENT GOALS: Pt's goal is to get back to being better to help with my family.  OBJECTIVE:    TODAY'S TREATMENT: 04/16/23 Activity Comments  Demo of different AD for offroad terrain   Resisted walking 2x2 min 15-10# cable  Multisensory balance Feet apart for eyes closed conditions  Gait training -ambulation w/ wide-based cane ascending/descending 8" steps, step over 6" hurdles.  -grass/hill w/ cane and CGA                PATIENT EDUCATION: Education details: edu on exam findings and progress/remaining deficits towards goals, answered pt's questions about safety with [phone] and how to increase walking endurance; advised to try walking for time inside with 4WW first to test endurance before trying to walk outside  Person educated: Patient Education method: Explanation Education comprehension: verbalized understanding     HOME EXERCISE PROGRAM Last updated: 01/08/23 Access Code: [email or web address] URL: [email or web address] Date: 01/08/2023 Prepared by: [facility] - Outpatient  Rehab - [facility]  Exercises - Sit to Stand Without Arm Support  - 1 x daily - 5 x weekly - 2 sets - 10 reps - Heel Toe Raises with Counter Support  - 1 x daily - 5 x weekly - 2 sets - 10 reps - Alternating Step Forward with Support  - 1 x daily - 5 x weekly - 2 sets - 10 reps - Sitting Knee Extension with Resistance  - 1 x daily - 5 x weekly - 2 sets - 10  reps - Seated Hamstring Curl with Anchored Resistance  - 1 x daily - 5 x weekly - 2 sets - 10 reps - Supine Sciatic Nerve Glide  - 1 x daily - 7 x weekly - 3 sets - 10 reps - Supine Posterior Pelvic Tilt  - 1 x daily - 7 x weekly - 3 sets - 10 reps - Standing Balance in Corner with Eyes Closed  - 1 x daily - 7 x weekly - 3 sets - 30 sec hold - Corner Balance Feet Apart: Eyes Closed With Head Turns  - 1 x daily - 7 x weekly - 3 sets - 3 reps - Corner Balance Feet Together With Eyes Open  - 1 x daily - 7 x weekly - 3 sets - 30 sec hold   --------------------------------------------------------------- Note: Objective measures below were completed at Evaluation unless otherwise noted.  DIAGNOSTIC FINDINGS: See above from CVA; MRI upcoming for RUE numbness and cervical pain  COGNITION: Overall cognitive status: Within functional limits for tasks assessed   SENSATION: Light touch: WFL except for R hand not being able to discern hot/cold  COORDINATION: Slowed alt toe taps, slowed heel to shin (R more than L)  MUSCLE TONE: LLE: Mild  POSTURE: rounded shoulders and forward head Sits in posterior pelvic tilt  LOWER EXTREMITY ROM:   AROM WFL BLEs  LOWER EXTREMITY MMT:    MMT Right Eval Left Eval  Hip flexion 4 4+  Hip extension    Hip abduction 4+ 4+  Hip adduction 4+ 4+  Hip internal rotation    Hip external rotation    Knee flexion 4 4+  Knee extension 4 4+  Ankle dorsiflexion 4+ 4+  Ankle plantarflexion    Ankle inversion 4 4  Ankle eversion 4 4  (Blank rows = not tested)   TRANSFERS: Assistive device utilized: None  Sit to stand: SBA Stand to sit: SBA  GAIT: Gait pattern: step to pattern, step through pattern, decreased step length- Right, decreased step length- Left, and narrow BOS Distance walked: 35 ft x 2 Assistive device utilized: Walker - 2 wheeled Level of assistance: CGA and Min A Comments: 38.54 sec 20 ft (0.52 ft/sec)  FUNCTIONAL TESTS:  5 times sit  to stand: 27.75 sec with Ues; performs sit<>stand x 1 with arms crossed at chest Timed up and go (TUG): 45.94 sec with RW  PATIENT SURVEYS:  FOTO 56; predicted 67    GOALS: Goals reviewed with patient? Yes  SHORT TERM GOALS: Target date: 02/01/2023  Pt will be independent with HEP for improved strength, balance, functional mobility. Baseline: NT 02/20/23 Goal status: IN PROGRESS  2.  Pt will improve 5x sit<>stand to less than or equal to 20 sec to demonstrate improved functional strength and transfer efficiency. Baseline: 27.75 sec with BUE support; 12.54 sec without UEs and decreased eccentric control 02/20/23 Goal status: MET 02/20/23  3.  Pt will improve TUG score to less than or equal to 35 sec for decreased fall risk. Baseline: 23.42 sec 02/20/23 Goal status: MET 02/20/23  4.  Berg score to improve by 7 points from baseline, for decreased fall risk. Baseline: 37>45 02/20/23 Goal status:MET 02/20/23  5.  Pt will ambulate at least 500 ft outdoor community surfaces, with RW, supervision.  Baseline:  RW with CGA/min; supervision  Goals status: MET     LONG TERM GOALS: Target date: 05/28/23    Pt will be independent with HEP for improved strength, balance, gait. Baseline:   Goal status: IN PROGRESS  2.  Pt will improve 5x sit<>stand to less than or equal to 12.5 sec to demonstrate improved functional strength and transfer efficiency. Baseline: 12.5 sec 02/20/23 Goal status:MET 02/20/23  3.  Pt will improve TUG score to less than or equal to 20 sec for decreased fall risk. Baseline: 23.42 sec 02/20/23; (04/09/23) 16 sec w/ RW Goal status: IN PROGRESS   4.  Pt will improve gait velocity to at least 1.3 ft/sec for improved gait efficiency and safety.  Baseline: 1.53 ft/sec 02/20/23 Goal status:MET 02/20/23  5.  Pt will ambulate 200 ft, household and short community distances, mod I, with least restrictive assistive device (cane vs. Dan Humphreys) Baseline:  Supervision w/ RW or rollator Goal  status: IN PROGRESS  6.  FOTO score to improve to 67 to demo improved overall functional mobility.  Baseline:  56; (04/09/23) 58  Goal status:  IN PROGRESS  7. Patient to score at least 50/56 on Berg in order to decrease falls risk.  Baseline:  45 02/20/23: (04/09/23) 49/56  Goal status:  IN PROGRESS   7. Patient to report ability to access local community such as church and grocery store with LRAD.  Baseline:  reports using w/c outside of house 02/20/23; (04/09/23) reports going to church w/ RW  Goal status:  IN PROGRESS  6.  Demonstrate improved endurance for community mobility per distance 1,200 ft during  Baseline: 1,035 ft 5/10 RPE  Goal status: INITIAL  ASSESSMENT:  CLINICAL IMPRESSION: Discussed AD for walking in grass/offroad with demo of rollator with large diameter, rubber wheels and pt reports this was model she was researching.  Walking against resistance to improve single limb stance against postural perturbation with difficulty under eccentric demands (as in walking down hill) maintaining legs stiffened in extension with minimal knee flexion and needing wider BOS.  Gait training to increase independence and safety with ambulation w/ use of cane to reinforce use of AD with trials in stair/curb negotation and stepping over obstacles to improve single limb support requiring CGA throughout and wide BOS.  Trials with walking in grass using cane demonstrating step-to pattern and wide BOS but maintaining adequate foot clearance.  Instructed in static, multisensory balance activities in corner and selected several for HEP to improve comfort with narrow BOS and eyes closed and head movement conditions with good teachback and verbalizatio nfor safe performance at home.  Continued sessions to progress POC details to improve mobility and reduce risk for falls.   OBJECTIVE IMPAIRMENTS: Abnormal gait, decreased balance, decreased mobility, difficulty walking, decreased strength, impaired tone,  impaired vision/preception, and postural dysfunction.   ACTIVITY LIMITATIONS: carrying, lifting, standing, squatting, stairs, transfers, bathing, toileting, dressing, reach over head, hygiene/grooming, locomotion level, and caring for others  PARTICIPATION LIMITATIONS: meal prep, cleaning, laundry, interpersonal relationship, driving, shopping, community activity, occupation, and church  PERSONAL FACTORS: 3+ comorbidities: See PMH above  are also affecting patient's functional outcome.   REHAB POTENTIAL: Good  CLINICAL DECISION MAKING: Evolving/moderate complexity  EVALUATION COMPLEXITY: Moderate  PLAN:  PT FREQUENCY: 1x/week for 6 weeks  PT DURATION: other: -  PLANNED INTERVENTIONS: 97110-Therapeutic exercises, 97530- Therapeutic activity, 97112- Neuromuscular re-education, 97535- Self Care, 32951- Manual therapy, [phone]- Gait training, [phone]- Orthotic Fit/training, [phone]- Aquatic Therapy, Patient/Family education, Balance training, Stair training, DME instructions, and Wheelchair mobility training  PLAN FOR NEXT SESSION: Gait with U-step and tensioned wheels for gait training more narrow BOS     10:58 AM, 04/16/23 M. Shary Decamp, PT, DPT Physical Therapist- [place] Office Number: [phone]

## 2023-04-18 NOTE — Progress Notes (Signed)
 Kindly inform the patient that carotid ultrasound study shows no significant blockage of either carotid artery in the neck.

## 2023-04-24 ENCOUNTER — Ambulatory Visit: Admitting: Physical Therapy

## 2023-04-24 ENCOUNTER — Encounter: Payer: Self-pay | Admitting: Physical Therapy

## 2023-04-24 DIAGNOSIS — I69354 Hemiplegia and hemiparesis following cerebral infarction affecting left non-dominant side: Secondary | ICD-10-CM | POA: Diagnosis not present

## 2023-04-24 DIAGNOSIS — R2681 Unsteadiness on feet: Secondary | ICD-10-CM

## 2023-04-24 DIAGNOSIS — M6281 Muscle weakness (generalized): Secondary | ICD-10-CM

## 2023-04-24 DIAGNOSIS — R2689 Other abnormalities of gait and mobility: Secondary | ICD-10-CM

## 2023-04-24 NOTE — Therapy (Unsigned)
 OUTPATIENT PHYSICAL THERAPY NEURO TREATMENT   Patient Name: Joan Robinson MRN: [id number] DOB:1960/10/30, 63 y.o., female Today's Date: 04/24/2023   PCP: April Manson, NP REFERRING PROVIDER: Micki Riley, MD   END OF SESSION:  PT End of Session - 04/24/23 1236     Visit Number 9    Number of Visits 13    Date for PT Re-Evaluation 05/28/23    Authorization Type [place] Medicaid UHC-27 visits combined    Authorization - Visit Number 9   ?correct number?   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   BCC (basal cell carcinoma of skin) 06/01/2012   Leg Follows with Dr Margo Aye   Bipolar disorder (HCC) 01/17/2007   Qualifier: Diagnosis of  By: Everardo All MD, Sean A    Cataract    Diverticulosis    Diverticulosis    Emphysema of lung (HCC)    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   Gallstones    GERD (gastroesophageal reflux disease)    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   Hashimoto's disease    Hyperlipidemia    Hypertension    Hypothyroidism 08/24/2006   Qualifier: Diagnosis of  By: Everardo All MD, Sean A     IBS (irritable bowel syndrome) 07/27/2016   Obesity 11/01/2013   PUD (peptic ulcer disease)    Sleep apnea 04/27/2016   Tobacco abuse    Past Surgical History:  Procedure Laterality Date   ABDOMINAL HYSTERECTOMY     ABDOMINAL HYSTERECTOMY  01/15/2009   complete   COLONOSCOPY     DILATION AND CURETTAGE OF UTERUS  01/16/1983   EYE SURGERY  03/03/2014   Surgery on both  eyes for epiretinal membrane (vitreous peel)   Gated Spect wall motion stress cardiolite  11/05/2001   HIATAL HERNIA REPAIR N/A 07/12/2021   Procedure: LAPAROSCOPY W/ EXTENSIVE FOREGUT DISSECTION; PARTIAL STOMACH REDUCTION; GASTROSTOMY TUBE PLACEMENT; GASTROPEXY;  Surgeon: Luretha Murphy, MD;  Location: [facility];  Service: General;  Laterality: N/A;   LAPAROSCOPIC RIGHT HEMI COLECTOMY Right 10/16/2022   Procedure: LAPAROSCOPIC ASSISTED RIGHT HEMI COLECTOMY;  Surgeon: Andria Meuse, MD;  Location: [facility] OR;  Service: General;  Laterality: Right;   POLYPECTOMY     TUBAL LIGATION  01/15/1993   UTERINE SUSPENSION     mesh   VITRECTOMY Bilateral 03/03/2014   XI ROBOTIC ASSISTED HIATAL HERNIA REPAIR N/A 05/01/2021   Procedure: XI ROBOTIC ASSISTED TYPE III HIATAL HERNIA REPAIR WITH FUNDOPLICATION;  Surgeon: Luretha Murphy, MD;  Location: [facility];  Service: General;  Laterality: N/A;   Patient Active Problem List   Diagnosis Date Noted   Dysphagia 11/05/2022   Vestibular dizziness 11/05/2022   Malnutrition of moderate degree 11/01/2022   Acute stroke due to ischemia [facility]) 10/25/2022   CVA (cerebral vascular accident) (HCC) 10/25/2022   Protein-calorie malnutrition, severe 10/19/2022   Diverticulosis 10/14/2022   History of emphysema (HCC) 10/14/2022  SBO (small bowel obstruction) (HCC) 10/13/2022   Aortic valve calcification 09/20/2022   Abdominal aortic atherosclerosis (HCC) 09/20/2022   Hiatal hernia 07/12/2021   Status post laparoscopic Nissen fundoplication 05/01/2021   IBS (irritable bowel syndrome) 07/27/2016   High-tone pelvic floor dysfunction 05/09/2016   Midline cystocele 05/09/2016   Vaginal atrophy 05/09/2016   Bladder prolapse, female, acquired 05/07/2016   Dystrophic nail 04/29/2016   Fatigue 04/27/2016   Sleep apnea 04/27/2016   UTI (urinary tract infection) 09/11/2015   Cataract cortical, senile, left 12/20/2014   Rectal lesion 10/10/2014   Other type of migraine  without status migrainosus 05/06/2014   Dyspepsia 05/06/2014   Postmenopausal estrogen deficiency 05/06/2014   Screening for breast cancer 05/06/2014   Bilateral low-tension glaucoma, indeterminate stage 12/29/2013   High myopia, bilateral 12/29/2013   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   Obesity 11/01/2013   Eustachian tube dysfunction 10/08/2013   Otalgia of left ear 10/08/2013   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   Arthritis 07/24/2013   Anxiety attack 03/08/2013   Paresthesia 12/22/2012   Headache 08/17/2012   BCC (basal cell carcinoma of skin) 06/01/2012   Left arm pain 05/10/2012   Right knee pain 05/10/2012   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   ADD (attention deficit disorder) 04/13/2012   Perimenopausal 01/16/2012   Macular pucker of both eyes 10/18/2011   Vitreous degeneration 10/18/2011   Chronic anemia 10/10/2011   Preventative health care 10/10/2011   Urticaria, chronic 10/10/2011   Macular pucker, bilateral 10/10/2011   Lower back pain    DIVERTICULITIS-COLON 07/25/2009   Diverticulitis of colon 07/25/2009   Hyperlipidemia 06/09/2009   Essential hypertension 06/09/2009   History of esophageal stricture 09/06/2008   GERD (gastroesophageal reflux disease) 07/27/2008   Bipolar disorder (HCC) 01/17/2007   Hypothyroidism 08/24/2006   Allergic rhinitis 08/24/2006   URINARY INCONTINENCE 08/24/2006    ONSET DATE: 10/13/2022  REFERRING DIAG: I63.9 (ICD-10-CM) - Brainstem stroke (HCC)   THERAPY DIAG:  Unsteadiness on feet  Other abnormalities of gait and mobility  Muscle weakness (generalized)  Rationale for Evaluation and Treatment: Rehabilitation  SUBJECTIVE:                                                                                                                                                                                             SUBJECTIVE STATEMENT: Was trying to reach in a cabinet and I  reached, but grabbed the wrong thing.  I hit the ground, and hurt my side, but it's okay.  Ordered the new walker and will get it today.    Pt accompanied by:  self  PERTINENT HISTORY: Per Dr. Pearlean Brownie office note 12/11/2022:  PMH of esophageal strictures status post dilation, multiple hernias status post multiple surgeries, GERD/PUD, hypertension, hyperlipidemia, autoimmune urticaria, emphysema, sleep apnea who presents to the ED on 10/13/2022 with right upper quadrant and right lower quadrant abdominal pain x 2 days associated nausea.  She was found to have small bowel obstruction and was being managed inpatient at [facility]  Around shift change, patient was noted to have left facial droop, slurring of her speech and upon further questioning, symptoms were first noted at 1700 on 10/14/2022.  Her team had difficulty establishing a last known well.  However, symptoms were first noted at 1700.  A code stroke was activated if she had a CT head without contrast which was notable for a moderately large left PICA territory infarct.  She was eval by teleneurology.  She was not given TNKase due to unclear last known well and the fact that the stroke looks completed.  CT angio head and neck with and without contrast was negative for any large vessel occlusion.  Specifically, no basilar occlusion or thrombosis noted.  CT perfusion with no core or mismatch noted.  Given the moderately large posterior fossa stroke which is in proximity to brainstem, and the extension of the stroke into the brainstem, she was transferred to [facility] medical ICU for immediate in person neurology evaluation and for close monitoring for signs of posterior fossa crowding and potential herniation.  She was monitored carefully in the ICU and remained stable and did not require any neurosurgical intervention.  MRI showed a large acute infarct involving both left PICA and Icar territory with mass effect on posterior fossa and fourth  ventricle mild hydrocephalus.  2D echo showed ejection fraction of 60 to 65%.  Left atrial size was normal.  LDL cholesterol 54 mg percent.  Hemoglobin A1c was 5.6.  Patient had significant left peripheral facial nerve weakness as well as dysphagia and left hemiataxia.  She was seen by physical Occupational Therapy transfer to inpatient rehab where she stayed for a month.  She made gradual improvement in his been home now for nearly 4 weeks.  She still has significant left facial weakness with dryness of the left eye with redness.  She is unable to close her left eye.  She does have an upcoming appointment to see ophthalmologist next week.  She is still not gotten any hearing back in the left ear.  She still has trouble swallowing has to swallow mostly on the right side.  She has a lot of drooling from the left corner of the mouth.  She still has some left-sided incoordination and she is working with therapist has shown some improvement.  She is still unable to walk on her own and requires 1 person assist with gait belt to walk with a wheelchair.  She is tolerating Plavix with minor bruising but no bleeding.  Patient is clearly significantly disabled and is currently on short-term disability and thinking about going on a long disability.  Patient also has a new complaint of neck pain and right arm numbness.  Her primary care physician has ordered an MRI of the C-spine is scheduled for next week for this.  Last CT head on 10/28/2022 had shown decrease mass effect in the posterior fossa with improved patency of the fourth ventricle.    PAIN:  Are you having pain?  1/10 low back-central  PRECAUTIONS: Fall and Other: vision changes    Had  L eye surgery and sees surgeon tomorrow. Cornea specialist next week. Reports double vision if she uncovers the L eye. 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 Pt reports no lifting >gallon of milk for 2 weeks(pt reports this has been lifted)  RED  FLAGS: None   WEIGHT BEARING RESTRICTIONS: No  FALLS: Has patient fallen in last 6 months? No  LIVING ENVIRONMENT: Lives with: lives with their family and daughter comes over to help as husband can't be primary caregiver Lives in: House/apartment Stairs:  6 steps to front of home with L rail ascending Has following equipment at home: Single point cane, Environmental consultant - 2 wheeled, Environmental consultant - 4 wheeled, and Wheelchair (manual)  PLOF: Independent; enjoyed gardening, office work; took care of family  PATIENT GOALS: Pt's goal is to get back to being better to help with my family.  OBJECTIVE:    TODAY'S TREATMENT: 04/24/2023 Activity Comments  Gait with rollator 50 ft x 4  Cues to stay within walker's BOS  Gait trial with U-step, 50 ft x 4   Gait with counter, then with rollator with exagerrated heel to toe pattern 85 ft x 2   Forward/back walking at counter with cues for exagerrated foot placement to lessen foot slap Also trialed with rollator, cues to slow pace to change directions  Multi-sensory balance training-feet apart, feet together: EC head turns        TODAY'S TREATMENT: 04/16/23 Activity Comments  Demo of different AD for offroad terrain   Resisted walking 2x2 min 15-10# cable  Multisensory balance Feet apart for eyes closed conditions  Gait training -ambulation w/ wide-based cane ascending/descending 8" steps, step over 6" hurdles.  -grass/hill w/ cane and CGA                PATIENT EDUCATION: Education details: edu on exam findings and progress/remaining deficits towards goals, answered pt's questions about safety with [phone] and how to increase walking endurance; advised to try walking for time inside with 4WW first to test endurance before trying to walk outside  Person educated: Patient Education method: Explanation Education comprehension: verbalized understanding     HOME EXERCISE PROGRAM Last updated: 01/08/23 Access Code: [email or web address] URL:  [email or web address] Date: 01/08/2023 Prepared by: [facility] - Outpatient  Rehab - [facility]  Exercises - Sit to Stand Without Arm Support  - 1 x daily - 5 x weekly - 2 sets - 10 reps - Heel Toe Raises with Counter Support  - 1 x daily - 5 x weekly - 2 sets - 10 reps - Alternating Step Forward with Support  - 1 x daily - 5 x weekly - 2 sets - 10 reps - Sitting Knee Extension with Resistance  - 1 x daily - 5 x weekly - 2 sets - 10 reps - Seated Hamstring Curl with Anchored Resistance  - 1 x daily - 5 x weekly - 2 sets - 10 reps - Supine Sciatic Nerve Glide  - 1 x daily - 7 x weekly - 3 sets - 10 reps - Supine Posterior Pelvic Tilt  - 1 x daily - 7 x weekly - 3 sets - 10 reps - Standing Balance in Corner with Eyes Closed  - 1 x daily - 7 x weekly - 3 sets - 30 sec hold - Corner Balance Feet Apart: Eyes Closed With Head Turns  - 1 x daily - 7 x weekly - 3 sets - 3 reps - Corner  Balance Feet Together With Eyes Open  - 1 x daily - 7 x weekly - 3 sets - 30 sec hold   --------------------------------------------------------------- Note: Objective measures below were completed at Evaluation unless otherwise noted.  DIAGNOSTIC FINDINGS: See above from CVA; MRI upcoming for RUE numbness and cervical pain  COGNITION: Overall cognitive status: Within functional limits for tasks assessed   SENSATION: Light touch: WFL except for R hand not being able to discern hot/cold  COORDINATION: Slowed alt toe taps, slowed heel to shin (R more than L)  MUSCLE TONE: LLE: Mild  POSTURE: rounded shoulders and forward head Sits in posterior pelvic tilt  LOWER EXTREMITY ROM:   AROM WFL BLEs  LOWER EXTREMITY MMT:    MMT Right Eval Left Eval  Hip flexion 4 4+  Hip extension    Hip abduction 4+ 4+  Hip adduction 4+ 4+  Hip internal rotation    Hip external rotation    Knee flexion 4 4+  Knee extension 4 4+  Ankle dorsiflexion 4+ 4+  Ankle plantarflexion    Ankle inversion 4  4  Ankle eversion 4 4  (Blank rows = not tested)   TRANSFERS: Assistive device utilized: None  Sit to stand: SBA Stand to sit: SBA  GAIT: Gait pattern: step to pattern, step through pattern, decreased step length- Right, decreased step length- Left, and narrow BOS Distance walked: 35 ft x 2 Assistive device utilized: Walker - 2 wheeled Level of assistance: CGA and Min A Comments: 38.54 sec 20 ft (0.52 ft/sec)  FUNCTIONAL TESTS:  5 times sit to stand: 27.75 sec with Ues; performs sit<>stand x 1 with arms crossed at chest Timed up and go (TUG): 45.94 sec with RW  PATIENT SURVEYS:  FOTO 56; predicted 67    GOALS: Goals reviewed with patient? Yes  SHORT TERM GOALS: Target date: 02/01/2023  Pt will be independent with HEP for improved strength, balance, functional mobility. Baseline: NT 02/20/23 Goal status: IN PROGRESS  2.  Pt will improve 5x sit<>stand to less than or equal to 20 sec to demonstrate improved functional strength and transfer efficiency. Baseline: 27.75 sec with BUE support; 12.54 sec without UEs and decreased eccentric control 02/20/23 Goal status: MET 02/20/23  3.  Pt will improve TUG score to less than or equal to 35 sec for decreased fall risk. Baseline: 23.42 sec 02/20/23 Goal status: MET 02/20/23  4.  Berg score to improve by 7 points from baseline, for decreased fall risk. Baseline: 37>45 02/20/23 Goal status:MET 02/20/23  5.  Pt will ambulate at least 500 ft outdoor community surfaces, with RW, supervision.  Baseline:  RW with CGA/min; supervision  Goals status: MET     LONG TERM GOALS: Target date: 05/28/23    Pt will be independent with HEP for improved strength, balance, gait. Baseline:   Goal status: IN PROGRESS  2.  Pt will improve 5x sit<>stand to less than or equal to 12.5 sec to demonstrate improved functional strength and transfer efficiency. Baseline: 12.5 sec 02/20/23 Goal status:MET 02/20/23  3.  Pt will improve TUG score to less than or  equal to 20 sec for decreased fall risk. Baseline: 23.42 sec 02/20/23; (04/09/23) 16 sec w/ RW Goal status: IN PROGRESS   4.  Pt will improve gait velocity to at least 1.3 ft/sec for improved gait efficiency and safety.  Baseline: 1.53 ft/sec 02/20/23 Goal status:MET 02/20/23  5.  Pt will ambulate 200 ft, household and short community distances, mod I, with least restrictive  assistive device (cane vs. Dan Humphreys) Baseline:  Supervision w/ RW or rollator Goal status: IN PROGRESS  6.  FOTO score to improve to 67 to demo improved overall functional mobility.  Baseline:  56; (04/09/23) 58  Goal status:  IN PROGRESS  7. Patient to score at least 50/56 on Berg in order to decrease falls risk.  Baseline:  45 02/20/23: (04/09/23) 49/56  Goal status:  IN PROGRESS   7. Patient to report ability to access local community such as church and grocery store with LRAD.  Baseline:  reports using w/c outside of house 02/20/23; (04/09/23) reports going to church w/ RW  Goal status:  IN PROGRESS  6.  Demonstrate improved endurance for community mobility per distance 1,200 ft during  Baseline: 1,035 ft 5/10 RPE  Goal status: INITIAL  ASSESSMENT:  CLINICAL IMPRESSION: Pt presents today ***. Skilled PT session focused on ***. Pt needs ***. Pt will continue to benefit from skilled PT towards goals for improved functional mobility and decreased fall risk.   Discussed AD for walking in grass/offroad with demo of rollator with large diameter, rubber wheels and pt reports this was model she was researching.  Walking against resistance to improve single limb stance against postural perturbation with difficulty under eccentric demands (as in walking down hill) maintaining legs stiffened in extension with minimal knee flexion and needing wider BOS.  Gait training to increase independence and safety with ambulation w/ use of cane to reinforce use of AD with trials in stair/curb negotation and stepping over obstacles to improve  single limb support requiring CGA throughout and wide BOS.  Trials with walking in grass using cane demonstrating step-to pattern and wide BOS but maintaining adequate foot clearance.  Instructed in static, multisensory balance activities in corner and selected several for HEP to improve comfort with narrow BOS and eyes closed and head movement conditions with good teachback and verbalizatio nfor safe performance at home.  Continued sessions to progress POC details to improve mobility and reduce risk for falls.   OBJECTIVE IMPAIRMENTS: Abnormal gait, decreased balance, decreased mobility, difficulty walking, decreased strength, impaired tone, impaired vision/preception, and postural dysfunction.   ACTIVITY LIMITATIONS: carrying, lifting, standing, squatting, stairs, transfers, bathing, toileting, dressing, reach over head, hygiene/grooming, locomotion level, and caring for others  PARTICIPATION LIMITATIONS: meal prep, cleaning, laundry, interpersonal relationship, driving, shopping, community activity, occupation, and church  PERSONAL FACTORS: 3+ comorbidities: See PMH above  are also affecting patient's functional outcome.   REHAB POTENTIAL: Good  CLINICAL DECISION MAKING: Evolving/moderate complexity  EVALUATION COMPLEXITY: Moderate  PLAN:  PT FREQUENCY: 1x/week for 6 weeks  PT DURATION: other: -  PLANNED INTERVENTIONS: 97110-Therapeutic exercises, 97530- Therapeutic activity, [id number]- Neuromuscular re-education, 97535- Self Care, [id number]- Manual therapy, [phone]- Gait training, [phone]- Orthotic Fit/training, [phone]- Aquatic Therapy, Patient/Family education, Balance training, Stair training, DME instructions, and Wheelchair mobility training  PLAN FOR NEXT SESSION: Lonia Blood, PT 04/24/23 12:38 PM Phone: [phone] Fax: [phone]  [facility] at [facility] [address], [address] [place], [place] [address] Phone # [phone] Fax # [phone]

## 2023-04-24 NOTE — Patient Instructions (Signed)
   [email or web address]   :[phone]

## 2023-04-26 ENCOUNTER — Encounter: Payer: Self-pay | Admitting: Physical Medicine and Rehabilitation

## 2023-04-29 NOTE — Therapy (Incomplete)
 OUTPATIENT PHYSICAL THERAPY NEURO PROGRESS NOTE   Patient Name: Joan Robinson MRN: [id number] DOB:18-Sep-1960, 63 y.o., female Today's Date: 04/30/2023   PCP: Sallye Crease, NP REFERRING PROVIDER: Lisabeth Rider, MD    Progress Note Reporting Period 02/28/23 to 04/30/23  See note below for Objective Data and Assessment of Progress/Goals.    END OF SESSION:  PT End of Session - 04/30/23 1444     Visit Number 10    Number of Visits 13    Date for PT Re-Evaluation 05/28/23    Authorization Type [place] Medicaid UHC-27 visits combined    Authorization - Visit Number 10    Authorization - Number of Visits 9   27 visits combined; pt consents to using remaining 6 visits that were not used in ST   PT Start Time 1403    PT Stop Time 1445    PT Time Calculation (min) 42 min    Equipment Utilized During Treatment Gait belt    Activity Tolerance Patient tolerated treatment well    Behavior During Therapy WFL for tasks assessed/performed                Past Medical History:  Diagnosis Date   Allergic rhinitis    Anemia 10/10/2011   Anxiety and depression 01/17/2007   Qualifier: Diagnosis of  By: [place] Hacker MD, Sean A    Arthritis 07/24/2013   Likely inflammatory and following with Dr Roy Cordoba of 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   Hashimoto's disease    Hyperlipidemia    Hypertension    Hypothyroidism 08/24/2006   Qualifier: Diagnosis of  By: [place] Hacker MD, Sean A     IBS (irritable bowel syndrome) 07/27/2016   Obesity 11/01/2013   PUD (peptic ulcer disease)    Sleep apnea 04/27/2016    Tobacco abuse    Past Surgical History:  Procedure Laterality Date   ABDOMINAL HYSTERECTOMY     ABDOMINAL HYSTERECTOMY  01/15/2009   complete   COLONOSCOPY     DILATION AND CURETTAGE OF UTERUS  01/16/1983   EYE SURGERY  03/03/2014   Surgery on both eyes for epiretinal membrane (vitreous peel)   Gated Spect wall motion stress cardiolite  11/05/2001   HIATAL HERNIA REPAIR N/A 07/12/2021   Procedure: LAPAROSCOPY W/ EXTENSIVE FOREGUT DISSECTION; PARTIAL STOMACH REDUCTION; GASTROSTOMY TUBE PLACEMENT; GASTROPEXY;  Surgeon: Jacolyn Matar, MD;  Location: [facility];  Service: General;  Laterality: N/A;   LAPAROSCOPIC RIGHT HEMI COLECTOMY Right 10/16/2022   Procedure: LAPAROSCOPIC ASSISTED RIGHT HEMI COLECTOMY;  Surgeon: Melvenia Stabs, MD;  Location: [facility] OR;  Service: General;  Laterality: Right;   POLYPECTOMY     TUBAL LIGATION  01/15/1993   UTERINE SUSPENSION     mesh   VITRECTOMY Bilateral 03/03/2014   XI ROBOTIC ASSISTED HIATAL HERNIA REPAIR N/A 05/01/2021   Procedure: XI ROBOTIC ASSISTED TYPE III HIATAL HERNIA REPAIR WITH FUNDOPLICATION;  Surgeon: Jacolyn Matar, MD;  Location: [facility];  Service: General;  Laterality: N/A;   Patient Active Problem List   Diagnosis Date Noted  Dysphagia 11/05/2022   Vestibular dizziness 11/05/2022   Malnutrition of moderate degree 11/01/2022   Acute stroke due to ischemia [facility]) 10/25/2022   CVA (cerebral vascular accident) (HCC) 10/25/2022   Protein-calorie malnutrition, severe 10/19/2022   Diverticulosis 10/14/2022   History of emphysema (HCC) 10/14/2022   SBO (small bowel obstruction) (HCC) 10/13/2022   Aortic valve calcification 09/20/2022   Abdominal aortic atherosclerosis (HCC) 09/20/2022   Hiatal hernia 07/12/2021   Status post laparoscopic Nissen fundoplication 05/01/2021   IBS (irritable bowel syndrome) 07/27/2016   High-tone pelvic floor dysfunction 05/09/2016   Midline cystocele 05/09/2016   Vaginal atrophy 05/09/2016   Bladder  prolapse, female, acquired 05/07/2016   Dystrophic nail 04/29/2016   Fatigue 04/27/2016   Sleep apnea 04/27/2016   UTI (urinary tract infection) 09/11/2015   Cataract cortical, senile, left 12/20/2014   Rectal lesion 10/10/2014   Other type of migraine without status migrainosus 05/06/2014   Dyspepsia 05/06/2014   Postmenopausal estrogen deficiency 05/06/2014   Screening for breast cancer 05/06/2014   Bilateral low-tension glaucoma, indeterminate stage 12/29/2013   High myopia, bilateral 12/29/2013   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   Obesity 11/01/2013   Eustachian tube dysfunction 10/08/2013   Otalgia of left ear 10/08/2013   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   Arthritis 07/24/2013   Anxiety attack 03/08/2013   Paresthesia 12/22/2012   Headache 08/17/2012   BCC (basal cell carcinoma of skin) 06/01/2012   Left arm pain 05/10/2012   Right knee pain 05/10/2012   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   ADD (attention deficit disorder) 04/13/2012   Perimenopausal 01/16/2012   Macular pucker of both eyes 10/18/2011   Vitreous degeneration 10/18/2011   Chronic anemia 10/10/2011   Preventative health care 10/10/2011   Urticaria, chronic 10/10/2011   Macular pucker, bilateral 10/10/2011   Lower back pain    DIVERTICULITIS-COLON 07/25/2009   Diverticulitis of colon 07/25/2009   Hyperlipidemia 06/09/2009   Essential hypertension 06/09/2009   History of esophageal stricture 09/06/2008   GERD (gastroesophageal reflux disease) 07/27/2008   Bipolar disorder (HCC) 01/17/2007   Hypothyroidism 08/24/2006   Allergic rhinitis 08/24/2006   URINARY INCONTINENCE 08/24/2006    ONSET DATE: 10/13/2022  REFERRING DIAG: I63.9 (ICD-10-CM) - Brainstem stroke (HCC)   THERAPY DIAG:  Unsteadiness on feet  Other abnormalities of gait and mobility  Muscle weakness (generalized)  Hemiplegia and hemiparesis following cerebral infarction affecting  left non-dominant side (HCC)  Rationale for Evaluation and Treatment: Rehabilitation  SUBJECTIVE:  SUBJECTIVE STATEMENT: Got a new walker off of [employer]. It feels sturdy outside. Denies recent falls. Reports still getting off balance on the L side and tendency to lean L occasionally. Reports that she has been to church and the grocery store- brings walker in with her while husband pushes the buggy. Reports that with hearing and vision loss, traversing a busy environment can be difficult.   Pt accompanied by: self  PERTINENT HISTORY: Per Dr. Janett Medin office note 12/11/2022:  PMH of esophageal strictures status post dilation, multiple hernias status post multiple surgeries, GERD/PUD, hypertension, hyperlipidemia, autoimmune urticaria, emphysema, sleep apnea who presents to the ED on 10/13/2022 with right upper quadrant and right lower quadrant abdominal pain x 2 days associated nausea.  She was found to have small bowel obstruction and was being managed inpatient at [facility].  Around shift change, patient was noted to have left facial droop, slurring of her speech and upon further questioning, symptoms were first noted at 1700 on 10/14/2022.  Her team had difficulty establishing a last known well.  However, symptoms were first noted at 1700.  A code stroke was activated if she had a CT head without contrast which was notable for a moderately large left PICA territory infarct.  She was eval by teleneurology.  She was not given TNKase due to unclear last known well and the fact that the stroke looks completed.  CT angio head and neck with and without contrast was negative for any large vessel occlusion.  Specifically, no basilar occlusion or thrombosis noted.  CT perfusion with no core or mismatch noted.  Given the  moderately large posterior fossa stroke which is in proximity to brainstem, and the extension of the stroke into the brainstem, she was transferred to [facility] medical ICU for immediate in person neurology evaluation and for close monitoring for signs of posterior fossa crowding and potential herniation.  She was monitored carefully in the ICU and remained stable and did not require any neurosurgical intervention.  MRI showed a large acute infarct involving both left PICA and Icar territory with mass effect on posterior fossa and fourth ventricle mild hydrocephalus.  2D echo showed ejection fraction of 60 to 65%.  Left atrial size was normal.  LDL cholesterol 54 mg percent.  Hemoglobin A1c was 5.6.  Patient had significant left peripheral facial nerve weakness as well as dysphagia and left hemiataxia.  She was seen by physical Occupational Therapy transfer to inpatient rehab where she stayed for a month.  She made gradual improvement in his been home now for nearly 4 weeks.  She still has significant left facial weakness with dryness of the left eye with redness.  She is unable to close her left eye.  She does have an upcoming appointment to see ophthalmologist next week.  She is still not gotten any hearing back in the left ear.  She still has trouble swallowing has to swallow mostly on the right side.  She has a lot of drooling from the left corner of the mouth.  She still has some left-sided incoordination and she is working with therapist has shown some improvement.  She is still unable to walk on her own and requires 1 person assist with gait belt to walk with a wheelchair.  She is tolerating Plavix with minor bruising but no bleeding.  Patient is clearly significantly disabled and is currently on short-term disability and thinking about going on a long disability.  Patient also has a new complaint of  neck pain and right arm numbness.  Her primary care physician has ordered an MRI of the C-spine is scheduled  for next week for this.  Last CT head on 10/28/2022 had shown decrease mass effect in the posterior fossa with improved patency of the fourth ventricle.    PAIN:  Are you having pain?  6-7/10 low back-central  and R arm   PRECAUTIONS: Fall and Other: vision changes    Had L eye surgery and sees surgeon tomorrow. Cornea specialist next week. Reports double vision if she uncovers the L eye. 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 Pt reports no lifting >gallon of milk for 2 weeks(pt reports this has been lifted)  RED FLAGS: None   WEIGHT BEARING RESTRICTIONS: No  FALLS: Has patient fallen in last 6 months? No  LIVING ENVIRONMENT: Lives with: lives with their family and daughter comes over to help as husband can't be primary caregiver Lives in: House/apartment Stairs:  6 steps to front of home with L rail ascending Has following equipment at home: Single point cane, Environmental consultant - 2 wheeled, Environmental consultant - 4 wheeled, and Wheelchair (manual)  PLOF: Independent; enjoyed gardening, office work; took care of family  PATIENT GOALS: Pt's goal is to get back to being better to help with my family.  OBJECTIVE:    TODAY'S TREATMENT: 04/30/23 Activity Comments  TUG 4WW 21.28 sec with 4WW  Berg 49/56   Standing EC + head turns 30" Walker in front ; c/o mild imbalance   Standing head turns to targets  30" c/o mild imbalance  Standing head nods to targets  30" C/o mild dizziness        HOME EXERCISE PROGRAM Last updated: 04/30/23 Access Code: [email or web address] URL: [email or web address] Date: 04/30/2023 Prepared by: [facility] - Outpatient  Rehab - [facility]  Exercises - Sit to Stand Without Arm Support  - 1 x daily - 5 x weekly - 2 sets - 10 reps - Heel Toe Raises with Counter Support  - 1 x daily - 5 x weekly - 2 sets - 10 reps - Alternating Step Forward with Support  - 1 x daily - 5 x weekly - 2 sets - 10 reps - Sitting Knee Extension with Resistance  - 1  x daily - 5 x weekly - 2 sets - 10 reps - Seated Hamstring Curl with Anchored Resistance  - 1 x daily - 5 x weekly - 2 sets - 10 reps - Supine Sciatic Nerve Glide  - 1 x daily - 7 x weekly - 3 sets - 10 reps - Supine Posterior Pelvic Tilt  - 1 x daily - 7 x weekly - 3 sets - 10 reps - Standing Balance in Corner with Eyes Closed  - 1 x daily - 7 x weekly - 3 sets - 30 sec hold - Corner Balance Feet Apart: Eyes Closed With Head Turns  - 1 x daily - 7 x weekly - 3 sets - 3 reps - Corner Balance Feet Together With Eyes Open  - 1 x daily - 7 x weekly - 3 sets - 30 sec hold - Seated Head Nod  - 1 x daily - 5 x weekly - 2-3 sets - 30 sec hold - Standing Toe Taps  - 1 x daily - 5 x weekly - 2 sets - 10 reps    PATIENT EDUCATION: Education details: discussed pt's remaining impairment and therapy visits- patient is agreeable to  going past 9 PT visits since ST did not use all 9 visits ; HEP update Person educated: Patient Education method: Explanation, Demonstration, Tactile cues, Verbal cues, and Handouts Education comprehension: verbalized understanding and returned demonstration      --------------------------------------------------------------- Note: Objective measures below were completed at Evaluation unless otherwise noted.  DIAGNOSTIC FINDINGS: See above from CVA; MRI upcoming for RUE numbness and cervical pain  COGNITION: Overall cognitive status: Within functional limits for tasks assessed   SENSATION: Light touch: WFL except for R hand not being able to discern hot/cold  COORDINATION: Slowed alt toe taps, slowed heel to shin (R more than L)  MUSCLE TONE: LLE: Mild  POSTURE: rounded shoulders and forward head Sits in posterior pelvic tilt  LOWER EXTREMITY ROM:   AROM WFL BLEs  LOWER EXTREMITY MMT:    MMT Right Eval Left Eval  Hip flexion 4 4+  Hip extension    Hip abduction 4+ 4+  Hip adduction 4+ 4+  Hip internal rotation    Hip external rotation    Knee flexion  4 4+  Knee extension 4 4+  Ankle dorsiflexion 4+ 4+  Ankle plantarflexion    Ankle inversion 4 4  Ankle eversion 4 4  (Blank rows = not tested)   TRANSFERS: Assistive device utilized: None  Sit to stand: SBA Stand to sit: SBA  GAIT: Gait pattern: step to pattern, step through pattern, decreased step length- Right, decreased step length- Left, and narrow BOS Distance walked: 35 ft x 2 Assistive device utilized: Walker - 2 wheeled Level of assistance: CGA and Min A Comments: 38.54 sec 20 ft (0.52 ft/sec)  FUNCTIONAL TESTS:  5 times sit to stand: 27.75 sec with Ues; performs sit<>stand x 1 with arms crossed at chest Timed up and go (TUG): 45.94 sec with RW  PATIENT SURVEYS:  FOTO 56; predicted 67    GOALS: Goals reviewed with patient? Yes  SHORT TERM GOALS: Target date: 02/01/2023  Pt will be independent with HEP for improved strength, balance, functional mobility. Baseline: NT 02/20/23 Goal status: IN PROGRESS  2.  Pt will improve 5x sit<>stand to less than or equal to 20 sec to demonstrate improved functional strength and transfer efficiency. Baseline: 27.75 sec with BUE support; 12.54 sec without UEs and decreased eccentric control 02/20/23 Goal status: MET 02/20/23  3.  Pt will improve TUG score to less than or equal to 35 sec for decreased fall risk. Baseline: 23.42 sec 02/20/23 Goal status: MET 02/20/23  4.  Berg score to improve by 7 points from baseline, for decreased fall risk. Baseline: 37>45 02/20/23 Goal status:MET 02/20/23  5.  Pt will ambulate at least 500 ft outdoor community surfaces, with RW, supervision.  Baseline:  RW with CGA/min; supervision  Goals status: MET     LONG TERM GOALS: Target date: 05/28/23    Pt will be independent with HEP for improved strength, balance, gait. Baseline:   Goal status: IN PROGRESS  2.  Pt will improve 5x sit<>stand to less than or equal to 12.5 sec to demonstrate improved functional strength and transfer  efficiency. Baseline: 12.5 sec 02/20/23 Goal status:MET 02/20/23  3.  Pt will improve TUG score to less than or equal to 20 sec for decreased fall risk. Baseline: 23.42 sec 02/20/23; (04/09/23) 16 sec w/ RW; 21.28 sec with 4WW 04/30/23  Goal status: IN PROGRESS  04/30/23   4.  Pt will improve gait velocity to at least 1.3 ft/sec for improved gait efficiency and safety.  Baseline: 1.53 ft/sec 02/20/23 Goal status:MET 02/20/23  5.  Pt will ambulate 200 ft, household and short community distances, mod I, with least restrictive assistive device (cane vs. Otho Blitz) Baseline:  Supervision w/ RW or rollator Goal status: IN PROGRESS  6.  FOTO score to improve to 67 to demo improved overall functional mobility.  Baseline:  56; (04/09/23) 58  Goal status:  IN PROGRESS  7. Patient to score at least 50/56 on Berg in order to decrease falls risk.  Baseline:  45 02/20/23: (04/09/23) 49/56  Goal status:  IN PROGRESS   7. Patient to report ability to access local community such as church and grocery store with LRAD.  Baseline:  reports using w/c outside of house 02/20/23; (04/09/23) reports going to church w/ RW; Reports that with hearing and vision loss, traversing a busy environment can be difficult but is going grocery shopping and to church with her husband 04/30/23  Goal status:  MET 04/30/23  6.  Demonstrate improved endurance for community mobility per distance 1,200 ft during  Baseline: 1,035 ft 5/10 RPE  Goal status: IN PROGRESS  ASSESSMENT:  CLINICAL IMPRESSION: Patient arrived to session without new complaints. Berg score of 49/56 was well-maintained from last session. Most difficulty evident with narrow BOS activities. Gait speed is still limited as evidenced by TUG. Patient reports remaining dizziness with head turns, however upon trialing these movements patient actually reported dizziness with head nods only. HEP was updated for max benefit and edu for safety. Patient reported understanding and  without complaints upon leaving. Patient is making slow progress towards goals.   OBJECTIVE IMPAIRMENTS: Abnormal gait, decreased balance, decreased mobility, difficulty walking, decreased strength, impaired tone, impaired vision/preception, and postural dysfunction.   ACTIVITY LIMITATIONS: carrying, lifting, standing, squatting, stairs, transfers, bathing, toileting, dressing, reach over head, hygiene/grooming, locomotion level, and caring for others  PARTICIPATION LIMITATIONS: meal prep, cleaning, laundry, interpersonal relationship, driving, shopping, community activity, occupation, and church  PERSONAL FACTORS: 3+ comorbidities: See PMH above  are also affecting patient's functional outcome.   REHAB POTENTIAL: Good  CLINICAL DECISION MAKING: Evolving/moderate complexity  EVALUATION COMPLEXITY: Moderate  PLAN:  PT FREQUENCY: 1x/week for 6 weeks  PT DURATION: other: -  PLANNED INTERVENTIONS: 97110-Therapeutic exercises, 97530- Therapeutic activity, 97112- Neuromuscular re-education, 97535- Self Care, 09811- Manual therapy, [phone]- Gait training, [phone]- Orthotic Fit/training, [phone]- Aquatic Therapy, Patient/Family education, Balance training, Stair training, DME instructions, and Wheelchair mobility training  PLAN FOR NEXT SESSION: narrow BOS/SLS balance, habituate head nods and bending    Thaddeus Filippo, PT, DPT 04/30/23 2:55 PM  [facility] at [facility] [address] [place], [place] [address] Phone # [phone] Fax # [phone]

## 2023-04-30 ENCOUNTER — Encounter: Payer: Self-pay | Admitting: Physical Therapy

## 2023-04-30 ENCOUNTER — Ambulatory Visit: Admitting: Physical Therapy

## 2023-04-30 DIAGNOSIS — M6281 Muscle weakness (generalized): Secondary | ICD-10-CM

## 2023-04-30 DIAGNOSIS — I69354 Hemiplegia and hemiparesis following cerebral infarction affecting left non-dominant side: Secondary | ICD-10-CM | POA: Diagnosis not present

## 2023-04-30 DIAGNOSIS — R2689 Other abnormalities of gait and mobility: Secondary | ICD-10-CM

## 2023-04-30 DIAGNOSIS — R2681 Unsteadiness on feet: Secondary | ICD-10-CM

## 2023-05-01 ENCOUNTER — Ambulatory Visit

## 2023-05-06 NOTE — Therapy (Signed)
 OUTPATIENT PHYSICAL THERAPY NEURO TREATMENT NOTE   Patient Name: Joan Robinson MRN: [id number] DOB:11-Jun-1960, 63 y.o., female Today's Date: 05/07/2023   PCP: Sallye Crease, NP REFERRING PROVIDER: Lisabeth Rider, MD     END OF SESSION:  PT End of Session - 05/07/23 1230     Visit Number 11    Number of Visits 13    Date for PT Re-Evaluation 05/28/23    Authorization Type [place] Medicaid UHC-27 visits combined    Authorization - Visit Number 11    Authorization - Number of Visits 9   27 visits combined; pt consents to using remaining 6 visits that were not used in ST   PT Start Time 1150    PT Stop Time 1228    PT Time Calculation (min) 38 min    Equipment Utilized During Treatment Gait belt    Activity Tolerance Patient tolerated treatment well    Behavior During Therapy WFL for tasks assessed/performed                 Past Medical History:  Diagnosis Date   Allergic rhinitis    Anemia 10/10/2011   Anxiety and depression 01/17/2007   Qualifier: Diagnosis of  By: [place] Hacker MD, Sean A    Arthritis 07/24/2013   Likely inflammatory and following with Dr Roy Cordoba of [facility]  rheumatology   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   BCC (basal cell carcinoma of skin) 06/01/2012   Leg Follows with Dr Del Favia   Bipolar disorder (HCC) 01/17/2007   Qualifier: Diagnosis of  By: [place] Hacker MD, Sean A    Cataract    Diverticulosis    Diverticulosis    Emphysema of lung (HCC)    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   Gallstones    GERD (gastroesophageal reflux disease)    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   Hashimoto's disease    Hyperlipidemia    Hypertension    Hypothyroidism 08/24/2006   Qualifier: Diagnosis of  By: [place] Hacker MD, Sean A     IBS (irritable bowel syndrome) 07/27/2016   Obesity 11/01/2013   PUD (peptic ulcer disease)    Sleep apnea 04/27/2016   Tobacco abuse    Past Surgical History:  Procedure Laterality Date   ABDOMINAL HYSTERECTOMY     ABDOMINAL HYSTERECTOMY   01/15/2009   complete   COLONOSCOPY     DILATION AND CURETTAGE OF UTERUS  01/16/1983   EYE SURGERY  03/03/2014   Surgery on both eyes for epiretinal membrane (vitreous peel)   Gated Spect wall motion stress cardiolite  11/05/2001   HIATAL HERNIA REPAIR N/A 07/12/2021   Procedure: LAPAROSCOPY W/ EXTENSIVE FOREGUT DISSECTION; PARTIAL STOMACH REDUCTION; GASTROSTOMY TUBE PLACEMENT; GASTROPEXY;  Surgeon: Jacolyn Matar, MD;  Location: [facility];  Service: General;  Laterality: N/A;   LAPAROSCOPIC RIGHT HEMI COLECTOMY Right 10/16/2022   Procedure: LAPAROSCOPIC ASSISTED RIGHT HEMI COLECTOMY;  Surgeon: Melvenia Stabs, MD;  Location: [facility] OR;  Service: General;  Laterality: Right;   POLYPECTOMY     TUBAL LIGATION  01/15/1993   UTERINE SUSPENSION     mesh   VITRECTOMY Bilateral 03/03/2014   XI ROBOTIC ASSISTED HIATAL HERNIA REPAIR N/A 05/01/2021   Procedure: XI ROBOTIC ASSISTED TYPE III HIATAL HERNIA REPAIR WITH FUNDOPLICATION;  Surgeon: Jacolyn Matar, MD;  Location: [facility];  Service: General;  Laterality: N/A;   Patient Active Problem List   Diagnosis Date Noted   Dysphagia 11/05/2022   Vestibular dizziness 11/05/2022   Malnutrition of moderate degree 11/01/2022   Acute  stroke due to ischemia [facility]) 10/25/2022   CVA (cerebral vascular accident) (HCC) 10/25/2022   Protein-calorie malnutrition, severe 10/19/2022   Diverticulosis 10/14/2022   History of emphysema (HCC) 10/14/2022   SBO (small bowel obstruction) (HCC) 10/13/2022   Aortic valve calcification 09/20/2022   Abdominal aortic atherosclerosis (HCC) 09/20/2022   Hiatal hernia 07/12/2021   Status post laparoscopic Nissen fundoplication 05/01/2021   IBS (irritable bowel syndrome) 07/27/2016   High-tone pelvic floor dysfunction 05/09/2016   Midline cystocele 05/09/2016   Vaginal atrophy 05/09/2016   Bladder prolapse, female, acquired 05/07/2016   Dystrophic nail 04/29/2016   Fatigue 04/27/2016   Sleep apnea 04/27/2016   UTI  (urinary tract infection) 09/11/2015   Cataract cortical, senile, left 12/20/2014   Rectal lesion 10/10/2014   Other type of migraine without status migrainosus 05/06/2014   Dyspepsia 05/06/2014   Postmenopausal estrogen deficiency 05/06/2014   Screening for breast cancer 05/06/2014   Bilateral low-tension glaucoma, indeterminate stage 12/29/2013   High myopia, bilateral 12/29/2013   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   Obesity 11/01/2013   Eustachian tube dysfunction 10/08/2013   Otalgia of left ear 10/08/2013   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   Arthritis 07/24/2013   Anxiety attack 03/08/2013   Paresthesia 12/22/2012   Headache 08/17/2012   BCC (basal cell carcinoma of skin) 06/01/2012   Left arm pain 05/10/2012   Right knee pain 05/10/2012   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   ADD (attention deficit disorder) 04/13/2012   Perimenopausal 01/16/2012   Macular pucker of both eyes 10/18/2011   Vitreous degeneration 10/18/2011   Chronic anemia 10/10/2011   Preventative health care 10/10/2011   Urticaria, chronic 10/10/2011   Macular pucker, bilateral 10/10/2011   Lower back pain    DIVERTICULITIS-COLON 07/25/2009   Diverticulitis of colon 07/25/2009   Hyperlipidemia 06/09/2009   Essential hypertension 06/09/2009   History of esophageal stricture 09/06/2008   GERD (gastroesophageal reflux disease) 07/27/2008   Bipolar disorder (HCC) 01/17/2007   Hypothyroidism 08/24/2006   Allergic rhinitis 08/24/2006   URINARY INCONTINENCE 08/24/2006    ONSET DATE: 10/13/2022  REFERRING DIAG: I63.9 (ICD-10-CM) - Brainstem stroke (HCC)   THERAPY DIAG:  Unsteadiness on feet  Other abnormalities of gait and mobility  Muscle weakness (generalized)  Hemiplegia and hemiparesis following cerebral infarction affecting left non-dominant side (HCC)  Rationale for Evaluation and Treatment: Rehabilitation  SUBJECTIVE:  SUBJECTIVE STATEMENT: Had a good Easter. Was able to perform her HEP. The eye MD gave her 2 injections and closed the L eye. Been noticing more unsteadiness when reaching and bending since then.   Pt accompanied by: self  PERTINENT HISTORY: Per Dr. Janett Medin office note 12/11/2022:  PMH of esophageal strictures status post dilation, multiple hernias status post multiple surgeries, GERD/PUD, hypertension, hyperlipidemia, autoimmune urticaria, emphysema, sleep apnea who presents to the ED on 10/13/2022 with right upper quadrant and right lower quadrant abdominal pain x 2 days associated nausea.  She was found to have small bowel obstruction and was being managed inpatient at [facility].  Around shift change, patient was noted to have left facial droop, slurring of her speech and upon further questioning, symptoms were first noted at 1700 on 10/14/2022.  Her team had difficulty establishing a last known well.  However, symptoms were first noted at 1700.  A code stroke was activated if she had a CT head without contrast which was notable for a moderately large left PICA territory infarct.  She was eval by teleneurology.  She was not given TNKase due to unclear last known well and the fact that the stroke looks completed.  CT angio head and neck with and without contrast was negative for any large vessel occlusion.  Specifically, no basilar occlusion or thrombosis noted.  CT perfusion with no core or mismatch noted.  Given the moderately large posterior fossa stroke which is in proximity to brainstem, and the extension of the stroke into the brainstem, she was transferred to [facility] medical ICU for immediate in person neurology evaluation and for close monitoring for signs of posterior fossa crowding and potential herniation.  She was  monitored carefully in the ICU and remained stable and did not require any neurosurgical intervention.  MRI showed a large acute infarct involving both left PICA and Icar territory with mass effect on posterior fossa and fourth ventricle mild hydrocephalus.  2D echo showed ejection fraction of 60 to 65%.  Left atrial size was normal.  LDL cholesterol 54 mg percent.  Hemoglobin A1c was 5.6.  Patient had significant left peripheral facial nerve weakness as well as dysphagia and left hemiataxia.  She was seen by physical Occupational Therapy transfer to inpatient rehab where she stayed for a month.  She made gradual improvement in his been home now for nearly 4 weeks.  She still has significant left facial weakness with dryness of the left eye with redness.  She is unable to close her left eye.  She does have an upcoming appointment to see ophthalmologist next week.  She is still not gotten any hearing back in the left ear.  She still has trouble swallowing has to swallow mostly on the right side.  She has a lot of drooling from the left corner of the mouth.  She still has some left-sided incoordination and she is working with therapist has shown some improvement.  She is still unable to walk on her own and requires 1 person assist with gait belt to walk with a wheelchair.  She is tolerating Plavix  with minor bruising but no bleeding.  Patient is clearly significantly disabled and is currently on short-term disability and thinking about going on a long disability.  Patient also has a new complaint of neck pain and right arm numbness.  Her primary care physician has ordered an MRI of the C-spine is scheduled for next week for this.  Last CT head on 10/28/2022 had  shown decrease mass effect in the posterior fossa with improved patency of the fourth ventricle.    PAIN:  Are you having pain?  6-7/10  and R arm   PRECAUTIONS: Fall and Other: vision changes    Had L eye surgery and sees surgeon tomorrow. Cornea  specialist next week. Reports double vision if she uncovers the L eye. 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 Pt reports no lifting >gallon of milk for 2 weeks(pt reports this has been lifted)  RED FLAGS: None   WEIGHT BEARING RESTRICTIONS: No  FALLS: Has patient fallen in last 6 months? No  LIVING ENVIRONMENT: Lives with: lives with their family and daughter comes over to help as husband can't be primary caregiver Lives in: House/apartment Stairs:  6 steps to front of home with L rail ascending Has following equipment at home: Single point cane, Environmental consultant - 2 wheeled, Environmental consultant - 4 wheeled, and Wheelchair (manual)  PLOF: Independent; enjoyed gardening, office work; took care of family  PATIENT GOALS: Pt's goal is to get back to being better to help with my family.  OBJECTIVE:     TODAY'S TREATMENT: 05/07/23 Activity Comments  sitting head nods to targets C/o head heaviness  standing alt toe tap into cabinet  Weaned from B to no UE support; encouraged hovering UEs above sink for safety   Standing reaching to place cones to/from tall mirror from walker seat Small steps and wide BOS but no LOB  Bending to place cone on floor, then forward step over it. Then bending to pick them up from floor, and stacking them overhead  Required cues for safety, use of counter, placement of cones. C/o head heaviness   1 foot on step + alt UE raise Required mirror feedback d/t limited spatial orientation; cueing to shift hips, widen BOS  1 foot on step + head turns/nods Required mirror feedback d/t limited spatial orientation; cueing to shift hips, widen BOS    PATIENT EDUCATION: Education details: discussed using wobble/leaning sensation as feedback on her balance/stability with a certain activity before a fall occurs, and how to shift wt in response  Person educated: Patient Education method: Explanation, Demonstration, Tactile cues, and Verbal cues Education comprehension:  verbalized understanding and returned demonstration    HOME EXERCISE PROGRAM Last updated: 04/30/23 Access Code: [email or web address] URL: [email or web address] Date: 04/30/2023 Prepared by: [facility] Outpatient  Rehab - [facility]  Exercises - Sit to Stand Without Arm Support  - 1 x daily - 5 x weekly - 2 sets - 10 reps - Heel Toe Raises with Counter Support  - 1 x daily - 5 x weekly - 2 sets - 10 reps - Alternating Step Forward with Support  - 1 x daily - 5 x weekly - 2 sets - 10 reps - Sitting Knee Extension with Resistance  - 1 x daily - 5 x weekly - 2 sets - 10 reps - Seated Hamstring Curl with Anchored Resistance  - 1 x daily - 5 x weekly - 2 sets - 10 reps - Supine Sciatic Nerve Glide  - 1 x daily - 7 x weekly - 3 sets - 10 reps - Supine Posterior Pelvic Tilt  - 1 x daily - 7 x weekly - 3 sets - 10 reps - Standing Balance in Corner with Eyes Closed  - 1 x daily - 7 x weekly - 3 sets - 30 sec hold - Corner Balance Feet Apart: Eyes  Closed With Head Turns  - 1 x daily - 7 x weekly - 3 sets - 3 reps - Corner Balance Feet Together With Eyes Open  - 1 x daily - 7 x weekly - 3 sets - 30 sec hold - Seated Head Nod  - 1 x daily - 5 x weekly - 2-3 sets - 30 sec hold - Standing Toe Taps  - 1 x daily - 5 x weekly - 2 sets - 10 reps      --------------------------------------------------------------- Note: Objective measures below were completed at Evaluation unless otherwise noted.  DIAGNOSTIC FINDINGS: See above from CVA; MRI upcoming for RUE numbness and cervical pain  COGNITION: Overall cognitive status: Within functional limits for tasks assessed   SENSATION: Light touch: WFL except for R hand not being able to discern hot/cold  COORDINATION: Slowed alt toe taps, slowed heel to shin (R more than L)  MUSCLE TONE: LLE: Mild  POSTURE: rounded shoulders and forward head Sits in posterior pelvic tilt  LOWER EXTREMITY ROM:   AROM WFL BLEs  LOWER EXTREMITY MMT:     MMT Right Eval Left Eval  Hip flexion 4 4+  Hip extension    Hip abduction 4+ 4+  Hip adduction 4+ 4+  Hip internal rotation    Hip external rotation    Knee flexion 4 4+  Knee extension 4 4+  Ankle dorsiflexion 4+ 4+  Ankle plantarflexion    Ankle inversion 4 4  Ankle eversion 4 4  (Blank rows = not tested)   TRANSFERS: Assistive device utilized: None  Sit to stand: SBA Stand to sit: SBA  GAIT: Gait pattern: step to pattern, step through pattern, decreased step length- Right, decreased step length- Left, and narrow BOS Distance walked: 35 ft x 2 Assistive device utilized: Walker - 2 wheeled Level of assistance: CGA and Min A Comments: 38.54 sec 20 ft (0.52 ft/sec)  FUNCTIONAL TESTS:  5 times sit to stand: 27.75 sec with Ues; performs sit<>stand x 1 with arms crossed at chest Timed up and go (TUG): 45.94 sec with RW  PATIENT SURVEYS:  FOTO 56; predicted 67    GOALS: Goals reviewed with patient? Yes  SHORT TERM GOALS: Target date: 02/01/2023  Pt will be independent with HEP for improved strength, balance, functional mobility. Baseline: NT 02/20/23 Goal status: IN PROGRESS  2.  Pt will improve 5x sit<>stand to less than or equal to 20 sec to demonstrate improved functional strength and transfer efficiency. Baseline: 27.75 sec with BUE support; 12.54 sec without UEs and decreased eccentric control 02/20/23 Goal status: MET 02/20/23  3.  Pt will improve TUG score to less than or equal to 35 sec for decreased fall risk. Baseline: 23.42 sec 02/20/23 Goal status: MET 02/20/23  4.  Berg score to improve by 7 points from baseline, for decreased fall risk. Baseline: 37>45 02/20/23 Goal status:MET 02/20/23  5.  Pt will ambulate at least 500 ft outdoor community surfaces, with RW, supervision.  Baseline:  RW with CGA/min; supervision  Goals status: MET     LONG TERM GOALS: Target date: 05/28/23    Pt will be independent with HEP for improved strength, balance,  gait. Baseline:   Goal status: IN PROGRESS  2.  Pt will improve 5x sit<>stand to less than or equal to 12.5 sec to demonstrate improved functional strength and transfer efficiency. Baseline: 12.5 sec 02/20/23 Goal status:MET 02/20/23  3.  Pt will improve TUG score to less than or equal to 20 sec  for decreased fall risk. Baseline: 23.42 sec 02/20/23; (04/09/23) 16 sec w/ RW; 21.28 sec with 4WW 04/30/23  Goal status: IN PROGRESS  04/30/23   4.  Pt will improve gait velocity to at least 1.3 ft/sec for improved gait efficiency and safety.  Baseline: 1.53 ft/sec 02/20/23 Goal status:MET 02/20/23  5.  Pt will ambulate 200 ft, household and short community distances, mod I, with least restrictive assistive device (cane vs. Otho Blitz) Baseline:  Supervision w/ RW or rollator Goal status: IN PROGRESS  6.  FOTO score to improve to 67 to demo improved overall functional mobility.  Baseline:  56; (04/09/23) 58  Goal status:  IN PROGRESS  7. Patient to score at least 50/56 on Berg in order to decrease falls risk.  Baseline:  45 02/20/23: (04/09/23) 49/56  Goal status:  IN PROGRESS   7. Patient to report ability to access local community such as church and grocery store with LRAD.  Baseline:  reports using w/c outside of house 02/20/23; (04/09/23) reports going to church w/ RW; Reports that with hearing and vision loss, traversing a busy environment can be difficult but is going grocery shopping and to church with her husband 04/30/23  Goal status:  MET 04/30/23  6.  Demonstrate improved endurance for community mobility per distance 1,200 ft during  Baseline: 1,035 ft 5/10 RPE  Goal status: IN PROGRESS  ASSESSMENT:  CLINICAL IMPRESSION: Patient arrived to session with L eye occluded; reports more imbalance since getting L eye injections and instruction to occlude that eye. Reviewed HEP updates from last session and provided cues for safety. Proceeded with bending and reaching tasks as patient reports most LOB  occurs with these activities. Patient without LOB today but again required cues for safety awareness. Used mirror feedback to improve awareness of spatial orientation and verbal/manual cues for appropriate wt shift. Patient reported understanding of edu provided and without complaints upon leaving.    OBJECTIVE IMPAIRMENTS: Abnormal gait, decreased balance, decreased mobility, difficulty walking, decreased strength, impaired tone, impaired vision/preception, and postural dysfunction.   ACTIVITY LIMITATIONS: carrying, lifting, standing, squatting, stairs, transfers, bathing, toileting, dressing, reach over head, hygiene/grooming, locomotion level, and caring for others  PARTICIPATION LIMITATIONS: meal prep, cleaning, laundry, interpersonal relationship, driving, shopping, community activity, occupation, and church  PERSONAL FACTORS: 3+ comorbidities: See PMH above  are also affecting patient's functional outcome.   REHAB POTENTIAL: Good  CLINICAL DECISION MAKING: Evolving/moderate complexity  EVALUATION COMPLEXITY: Moderate  PLAN:  PT FREQUENCY: 1x/week for 6 weeks  PT DURATION: other: -  PLANNED INTERVENTIONS: 97110-Therapeutic exercises, 97530- Therapeutic activity, 97112- Neuromuscular re-education, 97535- Self Care, [id number]- Manual therapy, [phone]- Gait training, [phone]- Orthotic Fit/training, [phone]- Aquatic Therapy, Patient/Family education, Balance training, Stair training, DME instructions, and Wheelchair mobility training  PLAN FOR NEXT SESSION: narrow BOS/SLS balance, habituate head nods and bending    Thaddeus Filippo, PT, DPT 05/07/23 12:30 PM  [facility] at [facility] [address], [address] [place], [place] [address] Phone # [phone] Fax # [phone]

## 2023-05-07 ENCOUNTER — Encounter: Payer: Self-pay | Admitting: Physical Therapy

## 2023-05-07 ENCOUNTER — Ambulatory Visit: Admitting: Occupational Therapy

## 2023-05-07 ENCOUNTER — Ambulatory Visit: Admitting: Physical Therapy

## 2023-05-07 ENCOUNTER — Telehealth: Payer: Self-pay | Admitting: Neurology

## 2023-05-07 DIAGNOSIS — I69354 Hemiplegia and hemiparesis following cerebral infarction affecting left non-dominant side: Secondary | ICD-10-CM

## 2023-05-07 DIAGNOSIS — R41842 Visuospatial deficit: Secondary | ICD-10-CM

## 2023-05-07 DIAGNOSIS — R2681 Unsteadiness on feet: Secondary | ICD-10-CM

## 2023-05-07 DIAGNOSIS — M6281 Muscle weakness (generalized): Secondary | ICD-10-CM

## 2023-05-07 DIAGNOSIS — R2689 Other abnormalities of gait and mobility: Secondary | ICD-10-CM

## 2023-05-07 DIAGNOSIS — R278 Other lack of coordination: Secondary | ICD-10-CM

## 2023-05-07 NOTE — Telephone Encounter (Signed)
 Pt called stating that she will be getting more surgery done on her L eye on the 28th of May. She will like to know if it will be ok/safe to stop the Plavix  a week before the surgery. Please advise.

## 2023-05-07 NOTE — Therapy (Signed)
 OUTPATIENT OCCUPATIONAL THERAPY NEURO  Treatment Note   Patient Name: Joan Robinson MRN: [id number] DOB:10/07/1960, 63 y.o., female Today's Date: 05/07/2023  PCP: Sallye Crease, NP REFERRING PROVIDER: Lisabeth Rider, MD  END OF SESSION:  OT End of Session - 05/07/23 1334     Visit Number 8    Number of Visits 17    Date for OT Re-Evaluation 06/04/23    Authorization Type [place] [employer] 2025 as of 03/16/23    Authorization Time Period 27 visits combined OT/PT/ST    OT Start Time 1104    OT Stop Time 1146    OT Time Calculation (min) 42 min    Behavior During Therapy WFL for tasks assessed/performed                    Past Medical History:  Diagnosis Date   Allergic rhinitis    Anemia 10/10/2011   Anxiety and depression 01/17/2007   Qualifier: Diagnosis of  By: Washington Hacker MD, Sean A    Arthritis 07/24/2013   Likely inflammatory and following with Dr Roy Cordoba of [facility]  rheumatology   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   BCC (basal cell carcinoma of skin) 06/01/2012   Leg Follows with Dr Del Favia   Bipolar disorder (HCC) 01/17/2007   Qualifier: Diagnosis of  By: Washington Hacker MD, Sean A    Cataract    Diverticulosis    Diverticulosis    Emphysema of lung (HCC)    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   Gallstones    GERD (gastroesophageal reflux disease)    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   Hashimoto's disease    Hyperlipidemia    Hypertension    Hypothyroidism 08/24/2006   Qualifier: Diagnosis of  By: Washington Hacker MD, Sean A     IBS (irritable bowel syndrome) 07/27/2016   Obesity 11/01/2013   PUD (peptic ulcer disease)    Sleep apnea 04/27/2016   Tobacco abuse    Past Surgical History:  Procedure Laterality Date   ABDOMINAL HYSTERECTOMY     ABDOMINAL HYSTERECTOMY  01/15/2009   complete   COLONOSCOPY     DILATION AND CURETTAGE OF UTERUS  01/16/1983   EYE SURGERY  03/03/2014   Surgery on both eyes for epiretinal membrane (vitreous peel)   Gated Spect wall motion  stress cardiolite  11/05/2001   HIATAL HERNIA REPAIR N/A 07/12/2021   Procedure: LAPAROSCOPY W/ EXTENSIVE FOREGUT DISSECTION; PARTIAL STOMACH REDUCTION; GASTROSTOMY TUBE PLACEMENT; GASTROPEXY;  Surgeon: Jacolyn Matar, MD;  Location: [facility];  Service: General;  Laterality: N/A;   LAPAROSCOPIC RIGHT HEMI COLECTOMY Right 10/16/2022   Procedure: LAPAROSCOPIC ASSISTED RIGHT HEMI COLECTOMY;  Surgeon: Melvenia Stabs, MD;  Location: [facility];  Service: General;  Laterality: Right;   POLYPECTOMY     TUBAL LIGATION  01/15/1993   UTERINE SUSPENSION     mesh   VITRECTOMY Bilateral 03/03/2014   XI ROBOTIC ASSISTED HIATAL HERNIA REPAIR N/A 05/01/2021   Procedure: XI ROBOTIC ASSISTED TYPE III HIATAL HERNIA REPAIR WITH FUNDOPLICATION;  Surgeon: Jacolyn Matar, MD;  Location: [facility];  Service: General;  Laterality: N/A;   Patient Active Problem List   Diagnosis Date Noted   Dysphagia 11/05/2022   Vestibular dizziness 11/05/2022   Malnutrition of moderate degree 11/01/2022   Acute stroke due to ischemia [facility]) 10/25/2022   CVA (cerebral vascular accident) (HCC) 10/25/2022   Protein-calorie malnutrition, severe 10/19/2022   Diverticulosis 10/14/2022   History of emphysema (HCC) 10/14/2022   SBO (small bowel obstruction) (HCC) 10/13/2022  Aortic valve calcification 09/20/2022   Abdominal aortic atherosclerosis (HCC) 09/20/2022   Hiatal hernia 07/12/2021   Status post laparoscopic Nissen fundoplication 05/01/2021   IBS (irritable bowel syndrome) 07/27/2016   High-tone pelvic floor dysfunction 05/09/2016   Midline cystocele 05/09/2016   Vaginal atrophy 05/09/2016   Bladder prolapse, female, acquired 05/07/2016   Dystrophic nail 04/29/2016   Fatigue 04/27/2016   Sleep apnea 04/27/2016   UTI (urinary tract infection) 09/11/2015   Cataract cortical, senile, left 12/20/2014   Rectal lesion 10/10/2014   Other type of migraine without status migrainosus 05/06/2014   Dyspepsia 05/06/2014    Postmenopausal estrogen deficiency 05/06/2014   Screening for breast cancer 05/06/2014   Bilateral low-tension glaucoma, indeterminate stage 12/29/2013   High myopia, bilateral 12/29/2013   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   Obesity 11/01/2013   Eustachian tube dysfunction 10/08/2013   Otalgia of left ear 10/08/2013   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   Arthritis 07/24/2013   Anxiety attack 03/08/2013   Paresthesia 12/22/2012   Headache 08/17/2012   BCC (basal cell carcinoma of skin) 06/01/2012   Left arm pain 05/10/2012   Right knee pain 05/10/2012   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   ADD (attention deficit disorder) 04/13/2012   Perimenopausal 01/16/2012   Macular pucker of both eyes 10/18/2011   Vitreous degeneration 10/18/2011   Chronic anemia 10/10/2011   Preventative health care 10/10/2011   Urticaria, chronic 10/10/2011   Macular pucker, bilateral 10/10/2011   Lower back pain    DIVERTICULITIS-COLON 07/25/2009   Diverticulitis of colon 07/25/2009   Hyperlipidemia 06/09/2009   Essential hypertension 06/09/2009   History of esophageal stricture 09/06/2008   GERD (gastroesophageal reflux disease) 07/27/2008   Bipolar disorder (HCC) 01/17/2007   Hypothyroidism 08/24/2006   Allergic rhinitis 08/24/2006   URINARY INCONTINENCE 08/24/2006    ONSET DATE: 10/13/22  REFERRING DIAG: I63.9 (ICD-10-CM) - Brainstem stroke  THERAPY DIAG:  Unsteadiness on feet  Muscle weakness (generalized)  Hemiplegia and hemiparesis following cerebral infarction affecting left non-dominant side (HCC)  Other lack of coordination  Visuospatial deficit  Rationale for Evaluation and Treatment: Rehabilitation  SUBJECTIVE:   SUBJECTIVE STATEMENT: "My hearing and my eyes are the biggest thing".  Pt reports going May 28th to sew her eye shut due to frequency of eye infection due to eye not closing.  Pt reports feeling like she loses her  balance more due to decreased vision.  Pt accompanied by: self and significant other (husband dropped her off)  PERTINENT HISTORY: admitted 9/28 with RUQ pain with SBO. 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 PMhx: HTN, HLD, peptic ulcer disease, diverticulitis, bipolar disorder, hypothyroidism, GERD, Hashimoto's disease.  PRECAUTIONS: Fall  WEIGHT BEARING RESTRICTIONS:  lifting restrictions: not to lift anything > gallon of milk for 2 weeks  PAIN:  Are you having pain? Yes: NPRS scale: 5-6/10 Pain location: R arm from shoulder to hand, mid-low back Pain description: constant throbbing, pulling sensation Aggravating factors: bending, lifting Relieving factors: rest  FALLS: Has patient fallen in last 6 months? Yes. Number of falls 1 fall prior to stroke and fell when out feeding the animals  LIVING ENVIRONMENT: Lives with: lives with their spouse and adult grandson with disabilities (dtr comes every other day to assist with shower) Lives in: House/apartment Stairs: Yes: External: 6 steps in front, 10 steps at back steps; on left going up; was to have ramp built but this has not happened yet  and is managing steps with assist from husband Has following equipment at home: Single point cane, Walker - 2 wheeled, Environmental consultant - 4 wheeled, Wheelchair (manual), shower chair, bed side commode, and Grab bars (next to toilet and 2 in the shower)  PLOF: Independent and Independent with basic ADLs  PATIENT GOALS: I want to get back to doing as much as I can.  OBJECTIVE:  Note: Objective measures were completed at Evaluation unless otherwise noted.  HAND DOMINANCE: Right  ADLs: Transfers/ambulation related to ADLs: w/c in the house or walking with RW when dtr is in the house, short distance walking from house to the car with cane and/or physical assistance from spouse Eating: husband cutting foods, pt utilizing straw for drinking, and  does spill drinks and food intermittently  Grooming: Mod I  UB Dressing: assist for fastening bra, to don shirt LB Dressing: assist for donning socks, has elastic laces for shoes but has been able to tie shoes Toileting: Supervision, spouse will assist intermittently Bathing: Supervision, dtr setup for soap and washing back and hair Tub Shower transfers: hand held assist when stepping in/out of shower Equipment: Shower seat with back, Grab bars, Walk in shower, bed side commode, Reacher, and Long handled sponge  IADLs: Light housekeeping: husband and dtr doing all Meal Prep: husband doing all the cooking Community mobility: not driving Medication management: dtr dispenses meds in pill box and utilizes alarm for reminders  Handwriting:  TBD - pt reports different but unsure if vision vs coordination  MOBILITY STATUS: Needs Assist: Supervision to CGA for mobility with RW, utilizing hand held assist when transferring to/from car and use of w/c in the home and community  POSTURE COMMENTS:  rounded shoulders  ACTIVITY TOLERANCE: Activity tolerance: diminished  FUNCTIONAL OUTCOME MEASURES: FOTO: 64, predicted 73 in 17 visits 02/20/23: 58, pt reports it's difficult to answer as "I am so right handed".    04/09/23 - PSFS 1.3   05/07/23 - PSFS 3.3  UPPER EXTREMITY ROM:    Active ROM Right eval Left eval  Shoulder flexion WFL 160*  Shoulder abduction [facility]   Shoulder adduction    Shoulder extension    Shoulder internal rotation [facility]  Shoulder external rotation [facility]  Elbow flexion [facility] WFL  Elbow extension WFL Slight bend  Wrist flexion    Wrist extension    Wrist ulnar deviation    Wrist radial deviation    Wrist pronation    Wrist supination    (Blank rows = not tested)  UPPER EXTREMITY MMT:   Grossly 4+/5 bilaterally  HAND FUNCTION: 02/20/23: R: 34# and L: 50# 04/09/23: R: 39# and L: 50#  COORDINATION: 9 Hole Peg test: Right: 25.87 sec; Left: 49.78 sec Box and  Blocks:  Right 57 blocks, Left 46 blocks 02/20/23: Box and blocks: Right: 46 blocks and Left: 44 blocks 02/20/23: 9 hole peg test: L: 40.62 sec   04/09/23: Box and blocks: Right: 58 blocks and Left: 52 blocks (hitting barrier 40-50% of time) 04/09/23: 9 hole peg test: Right: 24.56 sec and Left: 34.13 sec (pt with reports of LUE being "out in space" and noted decreased motor control and dropping x2 pegs)  SENSATION: Light touch: WFL Pt reports impaired sensation of hot/cold and pinch on RUE and face,   04/09/23: Pt reports still not feeling hold/cold on RUE but can feel "sensation" when holding hot items even with oven mit  COGNITION: Overall cognitive status: Within functional limits for tasks assessed  VISION: Subjective  report: "this eye (left) doesn't close.  I can see shadows/change in light" Baseline vision: Wears glasses all the time  VISION ASSESSMENT: Eye alignment: Impaired: Left eye lower lid severely droops, upper lid droops; L eye moves with R eye but no vision out of L eye  Ocular ROM: restricted on looking left and restricted on looking up Gaze preference/alignment: WDL Tracking/Visual pursuits: Decreased smoothness with vertical tracking Saccades: additional head turns occurred during testing Convergence: Impaired:   Visual Fields: Left visual field deficits Depth perception: Undershoots  Patient has difficulty with following activities due to following visual impairments: writing, reading  PRAXIS: Impaired: Motor planning; Ataxia on RUE  OBSERVATIONS: Pt well groomed and presented very knowledgeable about current status as well as medical history.  Pt limited in mobility in home due to reporting that her husband and adult grandson are both disabled and can provide intermittent assistance.   TODAY'S TREATMENT:                                                                              DATE:  05/07/23 NMR: Engaged in standing activity with focus on balance and motor  control when reaching outside BOS and across midline.  Pt picking up and moving rings with good standing tolerance and balance, demonstrating decreased motor control with LUE compared to RUE when picking up and translating rings from one cone to another.   Box and blocks: R: 62 blocks and L: 52 (hitting barrier x1).  Pt continues to demonstrate mild decrease in speed with gross motor tasks, however demonstrating much improved control this session, only hitting barrier x1. Pegs: engaged in placing and removing pegs while standing with focus on balance and motor control against resistance.  Pt demonstrating good control when manipulating, placing, and removing pegs from resistive peg board.  OT increasing challenge to incorporate NMR with placing pegs into small target after removal of pegs to further challenge motor control.   Self-care: engaged in discussion about progress towards PSFS goal with pt reporting having some days that are better than others, but reporting overall improvements with functional tasks as reflected on PSFS (see above).  Engaged in discussion about pt current concerns in regards to decreased balance, largely impacting by vision, and desire to work on "what is best for me".      04/09/23 Therapeutic activity: engaged in assessments (see above) with pt demonstrating improvements in coordination and strength, however still demonstrating decreased motor control of LUE during fine and gross motor tasks.   Self-care: engaged in PSFS (see above) to focus on pt specific goals.  Engaged in conversation of typical recovery expectations as well as current progress towards goals.  Pt has been limited in progress secondary to decreased consistency with illness and change in insurance provider.  Educated on plan of care and focus of remaining visits to address motor control, sensation, coordination, and safety with leisure tasks.      03/19/23 Cards: engaged in bimanual task of shuffling with pt  demonstrating difficulty due to arthritis and impaired coordination.  Pt then setting up and playing a game of solitaire with focus on visual spatial awareness, memory, sequencing, and coordination.  OT providing min-mod cues to  facilitate increased scanning to R and L to attend to and recognize cards to play.  Hand Gripper: with RUE on level 30#, progressing to 35#. Pt picked up 1 inch blocks with gripper with 3 drops on 30# and 2 drops on 35#.  OT challenging pt to place blocks on targets based on matching color to incorporate visual scanning.   Pt with min-mod difficulty, especially as task continued due to decreased sustained grasp.  Attempted with L hand on 50#, pt completing 70% before terminating task due to increased pain in L wrist.   Connect 4: engaged in in-hand manipulation and translation of checker pieces from palm to finger tips to place into elevated Connect 4 grid to facilitate increased reach and motor coordination while also integrating visual scanning/attention.  Pt demonstrating good sequencing and manipulation of checker pieces in hand, benefiting from min cues for head turns to compensate for decreased attention to L visual field and task started on R (by therapist).      PATIENT EDUCATION: Education details: educated on visual attention and turning head for improved spatial awareness Person educated: Patient Education method: Explanation Education comprehension: verbalized understanding and needs further education  HOME EXERCISE PROGRAM: Access Code: [email or web address] URL: [email or web address] Date: 02/28/2023 Prepared by: [facility] - Outpatient  Rehab - [facility]  Exercises - Putty Squeezes  - 1 x daily - 10 reps - Rolling Putty on Table  - 1 x daily - 10 reps - Tip Pinch with Putty  - 1 x daily - 10 reps - 3-Point Pinch with Putty  - 1 x daily - 10 reps - Finger Pinch and Pull with Putty  - 1 x daily - 10 reps - Removing Marbles from Putty  - 1 x daily -  10 reps   GOALS: Goals reviewed with patient? Yes  NEW SHORT TERM GOALS: Target date: 03/15/23  Pt will be Mod I with initial neuromuscular re-education HEP for R UE. Baseline: Goal status: MET - reports completing coordination and putty exercises "couple times a week" on 03/13/23  2.  Pt will be able to don/doff jacket with Supervision/setup utilizing adaptive strategies PRN. Baseline:  02/20/23: pt's husband still assisting with jacket, especially L side due to visual impairment Goal status: MET - 03/13/23  3.  Pt will verbalize understanding of compensatory strategies to accommodate for decreased vision in L visual field. Baseline:  02/20/23: pt reports still having difficulty, have not been able to truly address as pt has had large gap between visits  Goal status: IN PROGRESS  4.  Pt will verbalize understanding of energy conservation strategies and report 2 strategies being utilized during ADLs/IADLs to increase safety and independence. Baseline:  02/20/23: have not been able to fully address due to large gap between visits Goal status: MET - 03/13/23   NEW LONG TERM GOALS: Target date: 06/04/23  1.  Pt will demonstrate improved motor control to safely retrieve a moderate weight object at from shelf at mod-high range.  Baseline: decreased gross and fine motor control Goal status: IN PROGRESS  2.  Pt will verbalize understanding of desensitization strategies and recall at least 3 sensory safety precautions for affected UE.  Baseline: impaired sensation to hot/cold as well as hypersensitivity to touch  Goal Status: INITIAL  3.  Pt will demonstrate improved UE functional use for ADLs as evidenced by increasing box/ blocks score by 4 blocks with LUE and reduction of hitting barrier to <25% of time Baseline: R: 58 blocks and L:  52 blocks - hitting barrier 40-50% of time 05/07/23: L: 52 blocks, however hitting barrier <5% of time on 05/07/23 Goal status: Partially met   4.  Pt will  demonstrate improved coordination and motor control with LUE during bimanual tasks with and without weight and/or force (donning socks and earrings, putting away dishes) Baseline: ataxia on LUE Goal status: IN PROGRESS  5.  Pt will verbalize understanding of adaptive strategies and/or techniques to allow for increased safety and independence with leisure tasks of gardening/working in flower beds.  Baseline: LUE ataxia, decreased balance  Goal status: INITIAL  6.  Patient will report at least two-point increase in average PSFS score or at least three-point increase in a single activity score indicating functionally significant improvement given minimum detectable change.  Baseline: PSFS 1.3  Goal status: MET - 3.3 on 05/07/23   ASSESSMENT:  CLINICAL IMPRESSION: Pt continues to demonstrate decreased motor control and reports instances of dropping items with LUE.  Pt with appropriate accommodations in use and understanding of use of head turns to compensate for impaired vision on mobility and sustained grasp.  Due to insurance visit limit with decreased number of visits remaining and increased concerns of balance, plan to defer remaining visits to PT to allow for increased focus on balance. Pt in agreement with placing a 3-4 week hold to focus on balance with PT and to resume OT if visits and/or needs remain.  Pt will benefit from continued skilled occupational therapy services to address strength and coordination, ROM, pain management, altered sensation, balance, GM/FM control, safety awareness, introduction of compensatory strategies/AE prn, visual-perception, and implementation of an HEP to improve participation and safety during ADLs and IADLs. Have discussed possibility of pro-bono clinics to continue to address gross and fine motor control PRN.    PERFORMANCE DEFICITS: in functional skills including ADLs, IADLs, coordination, sensation, ROM, strength, pain, Fine motor control, Gross motor  control, mobility, balance, body mechanics, endurance, decreased knowledge of precautions, decreased knowledge of use of DME, vision, and UE functional use and psychosocial skills including coping strategies, environmental adaptation, and routines and behaviors.     PLAN:  OT FREQUENCY: 1-2x/week  OT DURATION: 8 weeks (taking a 3-4 week break to allow for MD visits/intervention)  PLANNED INTERVENTIONS: 97168 OT Re-evaluation, 97535 self care/ADL training, 78295 therapeutic exercise, 97530 therapeutic activity, 97112 neuromuscular re-education, 97035 ultrasound, 97010 moist heat, 97010 cryotherapy, 97760 Orthotics management and training, balance training, functional mobility training, compression bandaging, visual/perceptual remediation/compensation, psychosocial skills training, energy conservation, coping strategies training, patient/family education, and DME and/or AE instructions  RECOMMENDED OTHER SERVICES: NA  CONSULTED AND AGREED WITH PLAN OF CARE: Patient  PLAN FOR NEXT SESSION: continue coordination, reaching, standing activities; educate on visual and sensation compensatory strategies and desensitization  Engage in WB through BUE in standing at counter top or elevated mat to focus on ataxia/gross motor control prior to [facility], could do reaction timer pods in WB if tolerated.  D/c if no further needs by 06/04/23   Anthonette Kinsman, OTR/L 05/07/2023, 1:34 PM   [facility] at [facility] [address] [place], [address] [place], [place] [address] Phone # [phone] Fax # [phone]

## 2023-05-07 NOTE — Telephone Encounter (Signed)
 Called the pt and advised that since she is outside of the 3-6 month recommended wait time post stroke and she denied any new symptoms or concerns it would be ok to hold the medication 5 days prior to the procedure is typically what Dr Janett Medin recommends. They were asking for 7 days but Dr Janett Medin usually recommends no more than 5 days prior to a procedure. She verbalized understanding and would pass along the information.

## 2023-05-14 ENCOUNTER — Ambulatory Visit

## 2023-05-14 ENCOUNTER — Ambulatory Visit: Admitting: Occupational Therapy

## 2023-05-14 DIAGNOSIS — M6281 Muscle weakness (generalized): Secondary | ICD-10-CM

## 2023-05-14 DIAGNOSIS — R2689 Other abnormalities of gait and mobility: Secondary | ICD-10-CM

## 2023-05-14 DIAGNOSIS — I69354 Hemiplegia and hemiparesis following cerebral infarction affecting left non-dominant side: Secondary | ICD-10-CM | POA: Diagnosis not present

## 2023-05-14 DIAGNOSIS — R2681 Unsteadiness on feet: Secondary | ICD-10-CM

## 2023-05-14 NOTE — Therapy (Signed)
 OUTPATIENT PHYSICAL THERAPY NEURO TREATMENT NOTE   Patient Name: Joan Robinson MRN: [id number] DOB:July 13, 1960, 63 y.o., female Today's Date: 05/14/2023   PCP: Sallye Crease, NP REFERRING PROVIDER: Lisabeth Rider, MD     END OF SESSION:  PT End of Session - 05/14/23 1146     Visit Number 12    Number of Visits 13    Date for PT Re-Evaluation 05/28/23    Authorization Type Standard Medicaid UHC-27 visits combined    Authorization - Visit Number 12    Authorization - Number of Visits 9   27 visits combined; pt consents to using remaining 6 visits that were not used in ST   PT Start Time 1146    PT Stop Time 1230    PT Time Calculation (min) 44 min    Equipment Utilized During Treatment Gait belt    Activity Tolerance Patient tolerated treatment well    Behavior During Therapy WFL for tasks assessed/performed                 Past Medical History:  Diagnosis Date   Allergic rhinitis    Anemia 10/10/2011   Anxiety and depression 01/17/2007   Qualifier: Diagnosis of  By: Washington Hacker MD, Sean A    Arthritis 07/24/2013   Likely inflammatory and following with Dr Roy Cordoba of [facility]  rheumatology   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   BCC (basal cell carcinoma of skin) 06/01/2012   Leg Follows with Dr Del Favia   Bipolar disorder (HCC) 01/17/2007   Qualifier: Diagnosis of  By: Washington Hacker MD, Sean A    Cataract    Diverticulosis    Diverticulosis    Emphysema of lung (HCC)    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   Gallstones    GERD (gastroesophageal reflux disease)    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   Hashimoto's disease    Hyperlipidemia    Hypertension    Hypothyroidism 08/24/2006   Qualifier: Diagnosis of  By: Washington Hacker MD, Sean A     IBS (irritable bowel syndrome) 07/27/2016   Obesity 11/01/2013   PUD (peptic ulcer disease)    Sleep apnea 04/27/2016   Tobacco abuse    Past Surgical History:  Procedure Laterality Date   ABDOMINAL HYSTERECTOMY     ABDOMINAL HYSTERECTOMY   01/15/2009   complete   COLONOSCOPY     DILATION AND CURETTAGE OF UTERUS  01/16/1983   EYE SURGERY  03/03/2014   Surgery on both eyes for epiretinal membrane (vitreous peel)   Gated Spect wall motion stress cardiolite  11/05/2001   HIATAL HERNIA REPAIR N/A 07/12/2021   Procedure: LAPAROSCOPY W/ EXTENSIVE FOREGUT DISSECTION; PARTIAL STOMACH REDUCTION; GASTROSTOMY TUBE PLACEMENT; GASTROPEXY;  Surgeon: Jacolyn Matar, MD;  Location: [facility];  Service: General;  Laterality: N/A;   LAPAROSCOPIC RIGHT HEMI COLECTOMY Right 10/16/2022   Procedure: LAPAROSCOPIC ASSISTED RIGHT HEMI COLECTOMY;  Surgeon: Melvenia Stabs, MD;  Location: [facility] OR;  Service: General;  Laterality: Right;   POLYPECTOMY     TUBAL LIGATION  01/15/1993   UTERINE SUSPENSION     mesh   VITRECTOMY Bilateral 03/03/2014   XI ROBOTIC ASSISTED HIATAL HERNIA REPAIR N/A 05/01/2021   Procedure: XI ROBOTIC ASSISTED TYPE III HIATAL HERNIA REPAIR WITH FUNDOPLICATION;  Surgeon: Jacolyn Matar, MD;  Location: [facility];  Service: General;  Laterality: N/A;   Patient Active Problem List   Diagnosis Date Noted   Dysphagia 11/05/2022   Vestibular dizziness 11/05/2022   Malnutrition of moderate degree 11/01/2022   Acute  stroke due to ischemia [facility]) 10/25/2022   CVA (cerebral vascular accident) (HCC) 10/25/2022   Protein-calorie malnutrition, severe 10/19/2022   Diverticulosis 10/14/2022   History of emphysema (HCC) 10/14/2022   SBO (small bowel obstruction) (HCC) 10/13/2022   Aortic valve calcification 09/20/2022   Abdominal aortic atherosclerosis (HCC) 09/20/2022   Hiatal hernia 07/12/2021   Status post laparoscopic Nissen fundoplication 05/01/2021   IBS (irritable bowel syndrome) 07/27/2016   High-tone pelvic floor dysfunction 05/09/2016   Midline cystocele 05/09/2016   Vaginal atrophy 05/09/2016   Bladder prolapse, female, acquired 05/07/2016   Dystrophic nail 04/29/2016   Fatigue 04/27/2016   Sleep apnea 04/27/2016   UTI  (urinary tract infection) 09/11/2015   Cataract cortical, senile, left 12/20/2014   Rectal lesion 10/10/2014   Other type of migraine without status migrainosus 05/06/2014   Dyspepsia 05/06/2014   Postmenopausal estrogen deficiency 05/06/2014   Screening for breast cancer 05/06/2014   Bilateral low-tension glaucoma, indeterminate stage 12/29/2013   High myopia, bilateral 12/29/2013   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   Obesity 11/01/2013   Eustachian tube dysfunction 10/08/2013   Otalgia of left ear 10/08/2013   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   Arthritis 07/24/2013   Anxiety attack 03/08/2013   Paresthesia 12/22/2012   Headache 08/17/2012   BCC (basal cell carcinoma of skin) 06/01/2012   Left arm pain 05/10/2012   Right knee pain 05/10/2012   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   ADD (attention deficit disorder) 04/13/2012   Perimenopausal 01/16/2012   Macular pucker of both eyes 10/18/2011   Vitreous degeneration 10/18/2011   Chronic anemia 10/10/2011   Preventative health care 10/10/2011   Urticaria, chronic 10/10/2011   Macular pucker, bilateral 10/10/2011   Lower back pain    DIVERTICULITIS-COLON 07/25/2009   Diverticulitis of colon 07/25/2009   Hyperlipidemia 06/09/2009   Essential hypertension 06/09/2009   History of esophageal stricture 09/06/2008   GERD (gastroesophageal reflux disease) 07/27/2008   Bipolar disorder (HCC) 01/17/2007   Hypothyroidism 08/24/2006   Allergic rhinitis 08/24/2006   URINARY INCONTINENCE 08/24/2006    ONSET DATE: 10/13/2022  REFERRING DIAG: I63.9 (ICD-10-CM) - Brainstem stroke (HCC)   THERAPY DIAG:  Unsteadiness on feet  Other abnormalities of gait and mobility  Muscle weakness (generalized)  Hemiplegia and hemiparesis following cerebral infarction affecting left non-dominant side (HCC)  Rationale for Evaluation and Treatment: Rehabilitation  SUBJECTIVE:  SUBJECTIVE STATEMENT: Been working out in the yard the rollator works very well   Pt accompanied by: self  PERTINENT HISTORY: Per Dr. Janett Medin office note 12/11/2022:  PMH of esophageal strictures status post dilation, multiple hernias status post multiple surgeries, GERD/PUD, hypertension, hyperlipidemia, autoimmune urticaria, emphysema, sleep apnea who presents to the ED on 10/13/2022 with right upper quadrant and right lower quadrant abdominal pain x 2 days associated nausea.  She was found to have small bowel obstruction and was being managed inpatient at [facility].  Around shift change, patient was noted to have left facial droop, slurring of her speech and upon further questioning, symptoms were first noted at 1700 on 10/14/2022.  Her team had difficulty establishing a last known well.  However, symptoms were first noted at 1700.  A code stroke was activated if she had a CT head without contrast which was notable for a moderately large left PICA territory infarct.  She was eval by teleneurology.  She was not given TNKase due to unclear last known well and the fact that the stroke looks completed.  CT angio head and neck with and without contrast was negative for any large vessel occlusion.  Specifically, no basilar occlusion or thrombosis noted.  CT perfusion with no core or mismatch noted.  Given the moderately large posterior fossa stroke which is in proximity to brainstem, and the extension of the stroke into the brainstem, she was transferred to [facility] medical ICU for immediate in person neurology evaluation and for close monitoring for signs of posterior fossa crowding and potential herniation.  She was monitored carefully in the ICU and remained stable and did not require any neurosurgical intervention.  MRI showed a large  acute infarct involving both left PICA and Icar territory with mass effect on posterior fossa and fourth ventricle mild hydrocephalus.  2D echo showed ejection fraction of 60 to 65%.  Left atrial size was normal.  LDL cholesterol 54 mg percent.  Hemoglobin A1c was 5.6.  Patient had significant left peripheral facial nerve weakness as well as dysphagia and left hemiataxia.  She was seen by physical Occupational Therapy transfer to inpatient rehab where she stayed for a month.  She made gradual improvement in his been home now for nearly 4 weeks.  She still has significant left facial weakness with dryness of the left eye with redness.  She is unable to close her left eye.  She does have an upcoming appointment to see ophthalmologist next week.  She is still not gotten any hearing back in the left ear.  She still has trouble swallowing has to swallow mostly on the right side.  She has a lot of drooling from the left corner of the mouth.  She still has some left-sided incoordination and she is working with therapist has shown some improvement.  She is still unable to walk on her own and requires 1 person assist with gait belt to walk with a wheelchair.  She is tolerating Plavix  with minor bruising but no bleeding.  Patient is clearly significantly disabled and is currently on short-term disability and thinking about going on a long disability.  Patient also has a new complaint of neck pain and right arm numbness.  Her primary care physician has ordered an MRI of the C-spine is scheduled for next week for this.  Last CT head on 10/28/2022 had shown decrease mass effect in the posterior fossa with improved patency of the fourth ventricle.    PAIN:  Are  you having pain?  6-7/10  and R arm   PRECAUTIONS: Fall and Other: vision changes    Had L eye surgery and sees surgeon tomorrow. Cornea specialist next week. Reports double vision if she uncovers the L eye. needs to drink out of a straw; 5th CN damage and hearing  loss L ear. Pt reports no lifting >gallon of milk for 2 weeks(pt reports this has been lifted)  RED FLAGS: None   WEIGHT BEARING RESTRICTIONS: No  FALLS: Has patient fallen in last 6 months? No  LIVING ENVIRONMENT: Lives with: lives with their family and daughter comes over to help as husband can't be primary caregiver Lives in: House/apartment Stairs:  6 steps to front of home with L rail ascending Has following equipment at home: Single point cane, Environmental consultant - 2 wheeled, Environmental consultant - 4 wheeled, and Wheelchair (manual)  PLOF: Independent; enjoyed gardening, office work; took care of family  PATIENT GOALS: Pt's goal is to get back to being better to help with my family.  OBJECTIVE:   TODAY'S TREATMENT: 05/14/23 Activity Comments  Calf raise 2x10 Static hold for balance  Tandem stance x 30 sec   Rocker board x 2 min -lateral -ant-post  4-square step -tape lines -canes -airex pad in quad 2, 6" hurdle between 3/4  Vitals: 114/75 mmHg, 70 bpm   NU-step speed intervals x 10 min -2 min warm-up -30 sec sprint; 45 sec recovery Resistance level 4, able to maintain 120+ SPM  Pt education regarding cardiovascular training and HR thresholds       TODAY'S TREATMENT: 05/07/23 Activity Comments  sitting head nods to targets C/o head heaviness  standing alt toe tap into cabinet  Weaned from B to no UE support; encouraged hovering UEs above sink for safety   Standing reaching to place cones to/from tall mirror from walker seat Small steps and wide BOS but no LOB  Bending to place cone on floor, then forward step over it. Then bending to pick them up from floor, and stacking them overhead  Required cues for safety, use of counter, placement of cones. C/o head heaviness   1 foot on step + alt UE raise Required mirror feedback d/t limited spatial orientation; cueing to shift hips, widen BOS  1 foot on step + head turns/nods Required mirror feedback d/t limited spatial orientation; cueing to shift  hips, widen BOS    PATIENT EDUCATION: Education details: discussed using wobble/leaning sensation as feedback on her balance/stability with a certain activity before a fall occurs, and how to shift wt in response  Person educated: Patient Education method: Explanation, Demonstration, Tactile cues, and Verbal cues Education comprehension: verbalized understanding and returned demonstration    HOME EXERCISE PROGRAM Last updated: 04/30/23 Access Code: [email or web address] URL: [email or web address] Date: 04/30/2023 Prepared by: [facility] Out[facility]  Re[facility] - [facility]  Exercises - Sit to Stand Without Arm Support  - 1 x daily - 5 x weekly - 2 sets - 10 reps - Heel Toe Raises with Counter Support  - 1 x daily - 5 x weekly - 2 sets - 10 reps - Alternating Step Forward with Support  - 1 x daily - 5 x weekly - 2 sets - 10 reps - Sitting Knee Extension with Resistance  - 1 x daily - 5 x weekly - 2 sets - 10 reps - Seated Hamstring Curl with Anchored Resistance  - 1 x daily - 5 x weekly - 2 sets - 10 reps - Supine Sciatic Nerve  Glide  - 1 x daily - 7 x weekly - 3 sets - 10 reps - Supine Posterior Pelvic Tilt  - 1 x daily - 7 x weekly - 3 sets - 10 reps - Standing Balance in Corner with Eyes Closed  - 1 x daily - 7 x weekly - 3 sets - 30 sec hold - Corner Balance Feet Apart: Eyes Closed With Head Turns  - 1 x daily - 7 x weekly - 3 sets - 3 reps - Corner Balance Feet Together With Eyes Open  - 1 x daily - 7 x weekly - 3 sets - 30 sec hold - Seated Head Nod  - 1 x daily - 5 x weekly - 2-3 sets - 30 sec hold - Standing Toe Taps  - 1 x daily - 5 x weekly - 2 sets - 10 reps      --------------------------------------------------------------- Note: Objective measures below were completed at Evaluation unless otherwise noted.  DIAGNOSTIC FINDINGS: See above from CVA; MRI upcoming for RUE numbness and cervical pain  COGNITION: Overall cognitive status: Within functional limits for  tasks assessed   SENSATION: Light touch: WFL except for R hand not being able to discern hot/cold  COORDINATION: Slowed alt toe taps, slowed heel to shin (R more than L)  MUSCLE TONE: LLE: Mild  POSTURE: rounded shoulders and forward head Sits in posterior pelvic tilt  LOWER EXTREMITY ROM:   AROM WFL BLEs  LOWER EXTREMITY MMT:    MMT Right Eval Left Eval  Hip flexion 4 4+  Hip extension    Hip abduction 4+ 4+  Hip adduction 4+ 4+  Hip internal rotation    Hip external rotation    Knee flexion 4 4+  Knee extension 4 4+  Ankle dorsiflexion 4+ 4+  Ankle plantarflexion    Ankle inversion 4 4  Ankle eversion 4 4  (Blank rows = not tested)   TRANSFERS: Assistive device utilized: None  Sit to stand: SBA Stand to sit: SBA  GAIT: Gait pattern: step to pattern, step through pattern, decreased step length- Right, decreased step length- Left, and narrow BOS Distance walked: 35 ft x 2 Assistive device utilized: Walker - 2 wheeled Level of assistance: CGA and Min A Comments: 38.54 sec 20 ft (0.52 ft/sec)  FUNCTIONAL TESTS:  5 times sit to stand: 27.75 sec with Ues; performs sit<>stand x 1 with arms crossed at chest Timed up and go (TUG): 45.94 sec with RW  PATIENT SURVEYS:  FOTO 56; predicted 67    GOALS: Goals reviewed with patient? Yes  SHORT TERM GOALS: Target date: 02/01/2023  Pt will be independent with HEP for improved strength, balance, functional mobility. Baseline: NT 02/20/23 Goal status: IN PROGRESS  2.  Pt will improve 5x sit<>stand to less than or equal to 20 sec to demonstrate improved functional strength and transfer efficiency. Baseline: 27.75 sec with BUE support; 12.54 sec without UEs and decreased eccentric control 02/20/23 Goal status: MET 02/20/23  3.  Pt will improve TUG score to less than or equal to 35 sec for decreased fall risk. Baseline: 23.42 sec 02/20/23 Goal status: MET 02/20/23  4.  Berg score to improve by 7 points from baseline, for  decreased fall risk. Baseline: 37>45 02/20/23 Goal status:MET 02/20/23  5.  Pt will ambulate at least 500 ft outdoor community surfaces, with RW, supervision.  Baseline:  RW with CGA/min; supervision  Goals status: MET     LONG TERM GOALS: Target date: 05/28/23  Pt will be independent with HEP for improved strength, balance, gait. Baseline:   Goal status: IN PROGRESS  2.  Pt will improve 5x sit<>stand to less than or equal to 12.5 sec to demonstrate improved functional strength and transfer efficiency. Baseline: 12.5 sec 02/20/23 Goal status:MET 02/20/23  3.  Pt will improve TUG score to less than or equal to 20 sec for decreased fall risk. Baseline: 23.42 sec 02/20/23; (04/09/23) 16 sec w/ RW; 21.28 sec with 4WW 04/30/23  Goal status: IN PROGRESS  04/30/23   4.  Pt will improve gait velocity to at least 1.3 ft/sec for improved gait efficiency and safety.  Baseline: 1.53 ft/sec 02/20/23 Goal status:MET 02/20/23  5.  Pt will ambulate 200 ft, household and short community distances, mod I, with least restrictive assistive device (cane vs. Otho Blitz) Baseline:  Supervision w/ RW or rollator Goal status: IN PROGRESS  6.  FOTO score to improve to 67 to demo improved overall functional mobility.  Baseline:  56; (04/09/23) 58  Goal status:  IN PROGRESS  7. Patient to score at least 50/56 on Berg in order to decrease falls risk.  Baseline:  45 02/20/23: (04/09/23) 49/56  Goal status:  IN PROGRESS   7. Patient to report ability to access local community such as church and grocery store with LRAD.  Baseline:  reports using w/c outside of house 02/20/23; (04/09/23) reports going to church w/ RW; Reports that with hearing and vision loss, traversing a busy environment can be difficult but is going grocery shopping and to church with her husband 04/30/23  Goal status:  MET 04/30/23  6.  Demonstrate improved endurance for community mobility per distance 1,200 ft during  Baseline: 1,035 ft 5/10 RPE  Goal  status: IN PROGRESS  ASSESSMENT:  CLINICAL IMPRESSION: Initiated  with static balance activities to promote postural stability and limits of stability awareness with greater deficits in lateral directions. Dynamic balance to facilitate improved safety and awareness with stepping over obstacles and change of direction with greatest difficulty in stepping backwards over obstacles with improved awareness on subsequent rounds with greater awareness for exaggerating step length/height with good stability. NU-step intervals for cardiovascular fitness and explanation of relevant HR thresholds.  Pt reports she is having to change health insurance and encouraged her to inquire on plans that may have a fitness facility membership component for continued access.  Anticipate re-assessment and D/C at next session.   OBJECTIVE IMPAIRMENTS: Abnormal gait, decreased balance, decreased mobility, difficulty walking, decreased strength, impaired tone, impaired vision/preception, and postural dysfunction.   ACTIVITY LIMITATIONS: carrying, lifting, standing, squatting, stairs, transfers, bathing, toileting, dressing, reach over head, hygiene/grooming, locomotion level, and caring for others  PARTICIPATION LIMITATIONS: meal prep, cleaning, laundry, interpersonal relationship, driving, shopping, community activity, occupation, and church  PERSONAL FACTORS: 3+ comorbidities: See PMH above  are also affecting patient's functional outcome.   REHAB POTENTIAL: Good  CLINICAL DECISION MAKING: Evolving/moderate complexity  EVALUATION COMPLEXITY: Moderate  PLAN:  PT FREQUENCY: 1x/week for 6 weeks  PT DURATION: other: -  PLANNED INTERVENTIONS: 97110-Therapeutic exercises, 97530- Therapeutic activity, [id number]- Neuromuscular re-education, 97535- Self Care, [id number]- Manual therapy, [phone]- Gait training, [phone]- Orthotic Fit/training, [phone]- Aquatic Therapy, Patient/Family education, Balance training, Stair training, DME  instructions, and Wheelchair mobility training  PLAN FOR NEXT SESSION: D/C assessment  12:44 PM, 05/14/23 M. Kelly Amahd Morino, PT, DPT Physical Therapist- [place] Office Number: [phone]

## 2023-05-19 ENCOUNTER — Telehealth: Payer: Self-pay | Admitting: Internal Medicine

## 2023-05-19 DIAGNOSIS — K921 Melena: Secondary | ICD-10-CM

## 2023-05-19 NOTE — Telephone Encounter (Signed)
 Thanks Marijean Shouts. Wonderful lady.

## 2023-05-19 NOTE — Telephone Encounter (Signed)
 Contacted directly by patient yesterday around 7:15 PM Chart reviewed, patient of Dr. 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  Throughout the day yesterday she had several additional episodes of hematochezia but less bright red and less volume.  Able to eat and drink.  No abdominal pain.  No fever.  No presyncopal symptoms or symptoms of anemia on questioning.  I advised that if she continues to have blood in her stool that she come to the emergency room for further evaluation. However, if she has no further bleeding or her next bowel movement has dark old blood that she can continue to observe. She voices understanding  Differential includes diverticular bleeding, less likely bleeding from surgical anastomosis in the right colon, and even less likely brisk upper GI bleed.  We will need to check CBC on Monday and see how she has done clinically  She knows to go to the emergency room if she continues to bleed  Pod A nursing please check on her Monday morning

## 2023-05-20 ENCOUNTER — Encounter: Payer: Self-pay | Admitting: Internal Medicine

## 2023-05-20 NOTE — Addendum Note (Signed)
 Addended by: Glennette Lanius on: 05/20/2023 09:00 AM   Modules accepted: Orders

## 2023-05-20 NOTE — Telephone Encounter (Signed)
 sEE PHONE NOTE.

## 2023-05-20 NOTE — Telephone Encounter (Addendum)
 CBC orders placed in EPIC.   Attempted to reach patient for a symptom update. Unfortunately, no answer and voicemail is not accepting messages at this time. Will reach out again at a later time.

## 2023-05-20 NOTE — Addendum Note (Signed)
 Addended by: Nola Battiest R on: 05/20/2023 08:59 AM   Modules accepted: Orders

## 2023-05-20 NOTE — Telephone Encounter (Signed)
 Spoke with pt and she states she has not seen any blood today, stool is clay color. Pt will come for CBC today.  CBC ordered under Joan Robinson as perry and pyrtle are both off.

## 2023-05-20 NOTE — Therapy (Signed)
 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   BCC (basal cell carcinoma of skin) 06/01/2012   Leg Follows with Dr Del Favia   Bipolar disorder (HCC) 01/17/2007   Qualifier: Diagnosis of  By: Washington Hacker MD, Sean A    Cataract    Diverticulosis    Diverticulosis    Emphysema of lung (HCC)    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   Gallstones    GERD (gastroesophageal reflux disease)    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   Hashimoto's disease    Hyperlipidemia    Hypertension    Hypothyroidism 08/24/2006   Qualifier: Diagnosis of  By: Washington Hacker MD, Sean A     IBS (irritable bowel syndrome) 07/27/2016   Obesity 11/01/2013   PUD (peptic ulcer disease)    Sleep apnea 04/27/2016   Tobacco abuse    Past Surgical History:  Procedure Laterality Date   ABDOMINAL HYSTERECTOMY     ABDOMINAL HYSTERECTOMY  01/15/2009   complete   COLONOSCOPY     DILATION AND CURETTAGE OF UTERUS  01/16/1983   EYE SURGERY  03/03/2014   Surgery on both eyes for epiretinal membrane (vitreous peel)   Gated Spect wall motion stress cardiolite  11/05/2001   HIATAL HERNIA REPAIR N/A 07/12/2021   Procedure: LAPAROSCOPY W/ EXTENSIVE FOREGUT DISSECTION; PARTIAL STOMACH REDUCTION; GASTROSTOMY TUBE PLACEMENT; GASTROPEXY;  Surgeon: Jacolyn Matar, MD;  Location: [facility];  Service: General;  Laterality: N/A;   LAPAROSCOPIC RIGHT HEMI COLECTOMY Right 10/16/2022   Procedure: LAPAROSCOPIC ASSISTED RIGHT HEMI COLECTOMY;  Surgeon:  Melvenia Stabs, MD;  Location: [facility] OR;  Service: General;  Laterality: Right;   POLYPECTOMY     TUBAL LIGATION  01/15/1993   UTERINE SUSPENSION     mesh   VITRECTOMY Bilateral 03/03/2014   XI ROBOTIC ASSISTED HIATAL HERNIA REPAIR N/A 05/01/2021   Procedure: XI ROBOTIC ASSISTED TYPE III HIATAL HERNIA REPAIR WITH FUNDOPLICATION;  Surgeon: Jacolyn Matar, MD;  Location: [facility];  Service: General;  Laterality: N/A;   Patient Active Problem List   Diagnosis Date Noted   Dysphagia 11/05/2022   Vestibular dizziness 11/05/2022   Malnutrition of moderate degree 11/01/2022   Acute stroke due to ischemia [facility]) 10/25/2022   CVA (cerebral vascular accident) (HCC) 10/25/2022   Protein-calorie malnutrition, severe 10/19/2022   Diverticulosis 10/14/2022   History of emphysema (HCC) 10/14/2022   SBO (small bowel obstruction) (HCC) 10/13/2022   Aortic valve calcification 09/20/2022   Abdominal aortic atherosclerosis (HCC) 09/20/2022   Hiatal hernia 07/12/2021   Status post laparoscopic Nissen fundoplication 05/01/2021   IBS (irritable bowel syndrome) 07/27/2016   High-tone pelvic floor dysfunction 05/09/2016   Midline cystocele 05/09/2016   Vaginal atrophy 05/09/2016   Bladder prolapse, female, acquired 05/07/2016   Dystrophic nail 04/29/2016   Fatigue 04/27/2016   Sleep apnea 04/27/2016   UTI (urinary tract infection) 09/11/2015   Cataract cortical, senile, left 12/20/2014   Rectal lesion 10/10/2014   Other type of  migraine without status migrainosus 05/06/2014   Dyspepsia 05/06/2014   Postmenopausal estrogen deficiency 05/06/2014   Screening for breast cancer 05/06/2014   Bilateral low-tension glaucoma, indeterminate stage 12/29/2013   High myopia, bilateral 12/29/2013   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   Obesity 11/01/2013   Eustachian tube dysfunction 10/08/2013   Otalgia of left ear 10/08/2013   Autoimmune urticaria  07/24/2013   Arthritis 07/24/2013   Anxiety attack 03/08/2013   Paresthesia 12/22/2012   Headache 08/17/2012   BCC (basal cell carcinoma of skin) 06/01/2012   Left arm pain 05/10/2012   Right knee pain 05/10/2012   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   ADD (attention deficit disorder) 04/13/2012   Perimenopausal 01/16/2012   Macular pucker of both eyes 10/18/2011   Vitreous degeneration 10/18/2011   Chronic anemia 10/10/2011   Preventative health care 10/10/2011   Urticaria, chronic 10/10/2011   Macular pucker, bilateral 10/10/2011   Lower back pain    DIVERTICULITIS-COLON 07/25/2009   Diverticulitis of colon 07/25/2009   Hyperlipidemia 06/09/2009   Essential hypertension 06/09/2009   History of esophageal stricture 09/06/2008   GERD (gastroesophageal reflux disease) 07/27/2008   Bipolar disorder (HCC) 01/17/2007   Hypothyroidism 08/24/2006   Allergic rhinitis 08/24/2006   URINARY INCONTINENCE 08/24/2006    ONSET DATE: 10/13/2022  REFERRING DIAG: I63.9 (ICD-10-CM) - Brainstem stroke (HCC)   THERAPY DIAG:  No diagnosis found.  Rationale for Evaluation and Treatment: Rehabilitation  SUBJECTIVE:                                                                                                                                                                                             SUBJECTIVE STATEMENT: Been working out in the yard the rollator works very well   Pt accompanied by: self  PERTINENT HISTORY: Per Dr. Janett Medin office note 12/11/2022:  PMH of esophageal strictures status post dilation, multiple hernias status post multiple surgeries, GERD/PUD, hypertension, hyperlipidemia, autoimmune urticaria, emphysema, sleep apnea who presents to the ED on 10/13/2022 with right upper quadrant and right lower quadrant abdominal pain x 2 days associated nausea.  She was found to have small bowel obstruction and was being managed inpatient at [facility].  Around shift change,  patient was noted to have left facial droop, slurring of her speech and upon further questioning, symptoms were first noted at 1700 on 10/14/2022.  Her team had difficulty establishing a last known well.  However, symptoms were first noted at 1700.  A code stroke was activated if she had a CT head without contrast which was notable for a moderately large  left PICA territory infarct.  She was eval by teleneurology.  She was not given TNKase due to unclear last known well and the fact that the stroke looks completed.  CT angio head and neck with and without contrast was negative for any large vessel occlusion.  Specifically, no basilar occlusion or thrombosis noted.  CT perfusion with no core or mismatch noted.  Given the moderately large posterior fossa stroke which is in proximity to brainstem, and the extension of the stroke into the brainstem, she was transferred to [facility] medical ICU for immediate in person neurology evaluation and for close monitoring for signs of posterior fossa crowding and potential herniation.  She was monitored carefully in the ICU and remained stable and did not require any neurosurgical intervention.  MRI showed a large acute infarct involving both left PICA and Icar territory with mass effect on posterior fossa and fourth ventricle mild hydrocephalus.  2D echo showed ejection fraction of 60 to 65%.  Left atrial size was normal.  LDL cholesterol 54 mg percent.  Hemoglobin A1c was 5.6.  Patient had significant left peripheral facial nerve weakness as well as dysphagia and left hemiataxia.  She was seen by physical Occupational Therapy transfer to inpatient rehab where she stayed for a month.  She made gradual improvement in his been home now for nearly 4 weeks.  She still has significant left facial weakness with dryness of the left eye with redness.  She is unable to close her left eye.  She does have an upcoming appointment to see ophthalmologist next week.  She is still not gotten  any hearing back in the left ear.  She still has trouble swallowing has to swallow mostly on the right side.  She has a lot of drooling from the left corner of the mouth.  She still has some left-sided incoordination and she is working with therapist has shown some improvement.  She is still unable to walk on her own and requires 1 person assist with gait belt to walk with a wheelchair.  She is tolerating Plavix  with minor bruising but no bleeding.  Patient is clearly significantly disabled and is currently on short-term disability and thinking about going on a long disability.  Patient also has a new complaint of neck pain and right arm numbness.  Her primary care physician has ordered an MRI of the C-spine is scheduled for next week for this.  Last CT head on 10/28/2022 had shown decrease mass effect in the posterior fossa with improved patency of the fourth ventricle.    PAIN:  Are you having pain?  6-7/10  and R arm   PRECAUTIONS: Fall and Other: vision changes    Had L eye surgery and sees surgeon tomorrow. Cornea specialist next week. Reports double vision if she uncovers the L eye. 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 Pt reports no lifting >gallon of milk for 2 weeks(pt reports this has been lifted)  RED FLAGS: None   WEIGHT BEARING RESTRICTIONS: No  FALLS: Has patient fallen in last 6 months? No  LIVING ENVIRONMENT: Lives with: lives with their family and daughter comes over to help as husband can't be primary caregiver Lives in: House/apartment Stairs:  6 steps to front of home with L rail ascending Has following equipment at home: Single point cane, Walker - 2 wheeled, Environmental consultant - 4 wheeled, and Wheelchair (manual)  PLOF: Independent; enjoyed gardening, office work; took care of family  PATIENT GOALS: Pt's goal is to get back to being better to help with my family.  OBJECTIVE:     TODAY'S TREATMENT: 05/21/23 Activity Comments                          TODAY'S TREATMENT: 05/14/23 Activity Comments  Calf raise 2x10 Static hold for balance  Tandem stance x 30 sec   Rocker board x 2 min -lateral -ant-post  4-square step -tape lines -canes -airex pad in quad 2, 6" hurdle between 3/4  Vitals: 114/75 mmHg, 70 bpm   NU-step speed intervals x 10 min -2 min warm-up -30 sec sprint; 45 sec recovery Resistance level 4, able to maintain 120+ SPM  Pt education regarding cardiovascular training and HR thresholds       TODAY'S TREATMENT: 05/07/23 Activity Comments  sitting head nods to targets C/o head heaviness  standing alt toe tap into cabinet  Weaned from B to no UE support; encouraged hovering UEs above sink for safety   Standing reaching to place cones to/from tall mirror from walker seat Small steps and wide BOS but no LOB  Bending to place cone on floor, then forward step over it. Then bending to pick them up from floor, and stacking them overhead  Required cues for safety, use of counter, placement of cones. C/o head heaviness   1 foot on step + alt UE raise Required mirror feedback d/t limited spatial orientation; cueing to shift hips, widen BOS  1 foot on step + head turns/nods Required mirror feedback d/t limited spatial orientation; cueing to shift hips, widen BOS    PATIENT EDUCATION: Education details: discussed using wobble/leaning sensation as feedback on her balance/stability with a certain activity before a fall occurs, and how to shift wt in response  Person educated: Patient Education method: Explanation, Demonstration, Tactile cues, and Verbal cues Education comprehension: verbalized understanding and returned demonstration    HOME EXERCISE PROGRAM Last updated: 04/30/23 Access Code: [email or web address] URL: [email or web address] Date: 04/30/2023 Prepared by: [facility] - Outpatient  Rehab - [facility]  Exercises - Sit to Stand Without Arm Support  - 1 x daily - 5 x weekly - 2 sets - 10 reps - Heel  Toe Raises with Counter Support  - 1 x daily - 5 x weekly - 2 sets - 10 reps - Alternating Step Forward with Support  - 1 x daily - 5 x weekly - 2 sets - 10 reps - Sitting Knee Extension with Resistance  - 1 x daily - 5 x weekly - 2 sets - 10 reps - Seated Hamstring Curl with Anchored Resistance  - 1 x daily - 5 x weekly - 2 sets - 10 reps - Supine Sciatic Nerve Glide  - 1 x daily - 7 x weekly - 3 sets - 10 reps - Supine Posterior Pelvic Tilt  - 1 x daily - 7 x weekly - 3 sets - 10 reps - Standing Balance in Corner with Eyes Closed  - 1 x daily - 7 x weekly - 3 sets - 30 sec hold - Corner Balance Feet Apart: Eyes Closed With Head Turns  - 1 x daily - 7 x weekly - 3 sets - 3 reps - Corner Balance Feet Together With Eyes Open  - 1 x daily - 7 x weekly - 3 sets - 30 sec hold - Seated Head Nod  - 1 x daily - 5 x weekly - 2-3 sets - 30  sec hold - Standing Toe Taps  - 1 x daily - 5 x weekly - 2 sets - 10 reps      --------------------------------------------------------------- Note: Objective measures below were completed at Evaluation unless otherwise noted.  DIAGNOSTIC FINDINGS: See above from CVA; MRI upcoming for RUE numbness and cervical pain  COGNITION: Overall cognitive status: Within functional limits for tasks assessed   SENSATION: Light touch: WFL except for R hand not being able to discern hot/cold  COORDINATION: Slowed alt toe taps, slowed heel to shin (R more than L)  MUSCLE TONE: LLE: Mild  POSTURE: rounded shoulders and forward head Sits in posterior pelvic tilt  LOWER EXTREMITY ROM:   AROM WFL BLEs  LOWER EXTREMITY MMT:    MMT Right Eval Left Eval  Hip flexion 4 4+  Hip extension    Hip abduction 4+ 4+  Hip adduction 4+ 4+  Hip internal rotation    Hip external rotation    Knee flexion 4 4+  Knee extension 4 4+  Ankle dorsiflexion 4+ 4+  Ankle plantarflexion    Ankle inversion 4 4  Ankle eversion 4 4  (Blank rows = not tested)   TRANSFERS: Assistive  device utilized: None  Sit to stand: SBA Stand to sit: SBA  GAIT: Gait pattern: step to pattern, step through pattern, decreased step length- Right, decreased step length- Left, and narrow BOS Distance walked: 35 ft x 2 Assistive device utilized: Walker - 2 wheeled Level of assistance: CGA and Min A Comments: 38.54 sec 20 ft (0.52 ft/sec)  FUNCTIONAL TESTS:  5 times sit to stand: 27.75 sec with Ues; performs sit<>stand x 1 with arms crossed at chest Timed up and go (TUG): 45.94 sec with RW  PATIENT SURVEYS:  FOTO 56; predicted 67    GOALS: Goals reviewed with patient? Yes  SHORT TERM GOALS: Target date: 02/01/2023  Pt will be independent with HEP for improved strength, balance, functional mobility. Baseline: NT 02/20/23 Goal status: IN PROGRESS  2.  Pt will improve 5x sit<>stand to less than or equal to 20 sec to demonstrate improved functional strength and transfer efficiency. Baseline: 27.75 sec with BUE support; 12.54 sec without UEs and decreased eccentric control 02/20/23 Goal status: MET 02/20/23  3.  Pt will improve TUG score to less than or equal to 35 sec for decreased fall risk. Baseline: 23.42 sec 02/20/23 Goal status: MET 02/20/23  4.  Berg score to improve by 7 points from baseline, for decreased fall risk. Baseline: 37>45 02/20/23 Goal status:MET 02/20/23  5.  Pt will ambulate at least 500 ft outdoor community surfaces, with RW, supervision.  Baseline:  RW with CGA/min; supervision  Goals status: MET     LONG TERM GOALS: Target date: 05/28/23    Pt will be independent with HEP for improved strength, balance, gait. Baseline:   Goal status: IN PROGRESS  2.  Pt will improve 5x sit<>stand to less than or equal to 12.5 sec to demonstrate improved functional strength and transfer efficiency. Baseline: 12.5 sec 02/20/23 Goal status:MET 02/20/23  3.  Pt will improve TUG score to less than or equal to 20 sec for decreased fall risk. Baseline: 23.42 sec 02/20/23; (04/09/23) 16  sec w/ RW; 21.28 sec with 4WW 04/30/23  Goal status: IN PROGRESS  04/30/23   4.  Pt will improve gait velocity to at least 1.3 ft/sec for improved gait efficiency and safety.  Baseline: 1.53 ft/sec 02/20/23 Goal status:MET 02/20/23  5.  Pt will ambulate 200 ft,  household and short community distances, mod I, with least restrictive assistive device (cane vs. Otho Blitz) Baseline:  Supervision w/ RW or rollator Goal status: IN PROGRESS  6.  FOTO score to improve to 67 to demo improved overall functional mobility.  Baseline:  56; (04/09/23) 58  Goal status:  IN PROGRESS  7. Patient to score at least 50/56 on Berg in order to decrease falls risk.  Baseline:  45 02/20/23: (04/09/23) 49/56  Goal status:  IN PROGRESS   7. Patient to report ability to access local community such as church and grocery store with LRAD.  Baseline:  reports using w/c outside of house 02/20/23; (04/09/23) reports going to church w/ RW; Reports that with hearing and vision loss, traversing a busy environment can be difficult but is going grocery shopping and to church with her husband 04/30/23  Goal status:  MET 04/30/23  6.  Demonstrate improved endurance for community mobility per distance 1,200 ft during  Baseline: 1,035 ft 5/10 RPE  Goal status: IN PROGRESS  ASSESSMENT:  CLINICAL IMPRESSION: Initiated  with static balance activities to promote postural stability and limits of stability awareness with greater deficits in lateral directions. Dynamic balance to facilitate improved safety and awareness with stepping over obstacles and change of direction with greatest difficulty in stepping backwards over obstacles with improved awareness on subsequent rounds with greater awareness for exaggerating step length/height with good stability. NU-step intervals for cardiovascular fitness and explanation of relevant HR thresholds.  Pt reports she is having to change health insurance and encouraged her to inquire on plans that may have a  fitness facility membership component for continued access.  Anticipate re-assessment and D/C at next session.   OBJECTIVE IMPAIRMENTS: Abnormal gait, decreased balance, decreased mobility, difficulty walking, decreased strength, impaired tone, impaired vision/preception, and postural dysfunction.   ACTIVITY LIMITATIONS: carrying, lifting, standing, squatting, stairs, transfers, bathing, toileting, dressing, reach over head, hygiene/grooming, locomotion level, and caring for others  PARTICIPATION LIMITATIONS: meal prep, cleaning, laundry, interpersonal relationship, driving, shopping, community activity, occupation, and church  PERSONAL FACTORS: 3+ comorbidities: See PMH above  are also affecting patient's functional outcome.   REHAB POTENTIAL: Good  CLINICAL DECISION MAKING: Evolving/moderate complexity  EVALUATION COMPLEXITY: Moderate  PLAN:  PT FREQUENCY: 1x/week for 6 weeks  PT DURATION: other: -  PLANNED INTERVENTIONS: 97110-Therapeutic exercises, 97530- Therapeutic activity, [id number]- Neuromuscular re-education, 97535- Self Care, [id number]- Manual therapy, [phone]- Gait training, [phone]- Orthotic Fit/training, [phone]- Aquatic Therapy, Patient/Family education, Balance training, Stair training, DME instructions, and Wheelchair mobility training  PLAN FOR NEXT SESSION: D/C assessment

## 2023-05-21 ENCOUNTER — Other Ambulatory Visit (INDEPENDENT_AMBULATORY_CARE_PROVIDER_SITE_OTHER)

## 2023-05-21 ENCOUNTER — Encounter: Payer: Self-pay | Admitting: *Deleted

## 2023-05-21 ENCOUNTER — Encounter: Payer: Self-pay | Admitting: Physical Therapy

## 2023-05-21 ENCOUNTER — Encounter: Admitting: Occupational Therapy

## 2023-05-21 ENCOUNTER — Ambulatory Visit: Attending: Neurology | Admitting: Physical Therapy

## 2023-05-21 DIAGNOSIS — R2681 Unsteadiness on feet: Secondary | ICD-10-CM | POA: Insufficient documentation

## 2023-05-21 DIAGNOSIS — R2689 Other abnormalities of gait and mobility: Secondary | ICD-10-CM | POA: Diagnosis present

## 2023-05-21 DIAGNOSIS — K921 Melena: Secondary | ICD-10-CM

## 2023-05-21 DIAGNOSIS — I69354 Hemiplegia and hemiparesis following cerebral infarction affecting left non-dominant side: Secondary | ICD-10-CM | POA: Insufficient documentation

## 2023-05-21 DIAGNOSIS — M6281 Muscle weakness (generalized): Secondary | ICD-10-CM | POA: Diagnosis present

## 2023-05-21 LAB — CBC WITH DIFFERENTIAL/PLATELET
Basophils Absolute: 0 10*3/uL (ref 0.0–0.1)
Basophils Relative: 0.5 % (ref 0.0–3.0)
Eosinophils Absolute: 0.2 10*3/uL (ref 0.0–0.7)
Eosinophils Relative: 5.4 % — ABNORMAL HIGH (ref 0.0–5.0)
HCT: 30.9 % — ABNORMAL LOW (ref 36.0–46.0)
Hemoglobin: 10.2 g/dL — ABNORMAL LOW (ref 12.0–15.0)
Lymphocytes Relative: 35.2 % (ref 12.0–46.0)
Lymphs Abs: 1 10*3/uL (ref 0.7–4.0)
MCHC: 33.1 g/dL (ref 30.0–36.0)
MCV: 90.3 fl (ref 78.0–100.0)
Monocytes Absolute: 0.3 10*3/uL (ref 0.1–1.0)
Monocytes Relative: 10.1 % (ref 3.0–12.0)
Neutro Abs: 1.5 10*3/uL (ref 1.4–7.7)
Neutrophils Relative %: 48.8 % (ref 43.0–77.0)
Platelets: 136 10*3/uL — ABNORMAL LOW (ref 150.0–400.0)
RBC: 3.43 Mil/uL — ABNORMAL LOW (ref 3.87–5.11)
RDW: 14.2 % (ref 11.5–15.5)
WBC: 3 10*3/uL — ABNORMAL LOW (ref 4.0–10.5)

## 2023-05-28 ENCOUNTER — Encounter: Admitting: Occupational Therapy

## 2023-05-30 ENCOUNTER — Telehealth: Payer: Self-pay | Admitting: Internal Medicine

## 2023-05-30 NOTE — Telephone Encounter (Signed)
 I spoke with pt and offered an earlier appt with Dr. Elvin Hammer. Pt sched for 6/24 at 10:20. Pt verbalized understanding and encouraged to call our office with further questions, concerns, new or worsening symptoms.

## 2023-05-30 NOTE — Telephone Encounter (Signed)
 Urgent referral in [place] for rectal bleeding.  Spoke with patient and she declined to schedule with extender.  Scheduled with Dr. Elvin Hammer for 07/26/23 and placed on waitlist.  Patient is asking for sooner appt.  Please advise.  Thanks

## 2023-06-02 ENCOUNTER — Emergency Department (HOSPITAL_BASED_OUTPATIENT_CLINIC_OR_DEPARTMENT_OTHER)
Admission: EM | Admit: 2023-06-02 | Discharge: 2023-06-03 | Discharge status: Against Medical Advice | Attending: Emergency Medicine | Admitting: Emergency Medicine

## 2023-06-02 ENCOUNTER — Other Ambulatory Visit: Payer: Self-pay

## 2023-06-02 DIAGNOSIS — Z5321 Procedure and treatment not carried out due to patient leaving prior to being seen by health care provider: Secondary | ICD-10-CM | POA: Diagnosis not present

## 2023-06-02 DIAGNOSIS — X58XXXA Exposure to other specified factors, initial encounter: Secondary | ICD-10-CM | POA: Insufficient documentation

## 2023-06-02 DIAGNOSIS — S8001XA Contusion of right knee, initial encounter: Secondary | ICD-10-CM | POA: Insufficient documentation

## 2023-06-02 DIAGNOSIS — Z7902 Long term (current) use of antithrombotics/antiplatelets: Secondary | ICD-10-CM | POA: Diagnosis not present

## 2023-06-02 DIAGNOSIS — Z8673 Personal history of transient ischemic attack (TIA), and cerebral infarction without residual deficits: Secondary | ICD-10-CM | POA: Insufficient documentation

## 2023-06-02 NOTE — ED Triage Notes (Signed)
 Pt POV reporting hematoma behind R knee. Denies recent trauma to area. Called nurse hotline and advised to come in for evaluation. Recent stroke last year, on plavix .

## 2023-06-03 ENCOUNTER — Encounter: Admitting: Occupational Therapy

## 2023-06-03 DIAGNOSIS — E785 Hyperlipidemia, unspecified: Secondary | ICD-10-CM | POA: Insufficient documentation

## 2023-06-03 NOTE — Progress Notes (Signed)
 Cardiology Office Note:   Date:  06/05/2023  ID:  Evanthia Maund Muralles, DOB [birth year], MRN [id number] PCP: Sallye Crease, NP  [facility] Providers Cardiologist:  Eilleen Grates, MD {  History of Present Illness:   Joan Robinson is a 63 y.o. female who presents for evaluation of aortic valve calcification and aortic atherosclerosis.  She had a cath years ago at [facility] and a stress test.  It looks like she might have been in her 40s.  She was told this was normal.   She was found to have coronary, aortic and aortic valve calcification on CT.  She had a negative POET (Plain Old Exercise Treadmill) in 2024.    She unfortunately had a stroke in the fall 2024.  She had initially presented with right upper quadrant and right lower quadrant abdominal pain and found to have small bowel obstruction when she had the acute stroke.  She was given thrombolytics.  CT angio of the head and neck was without large vessel occlusion.  MRI showed a large acute infarct involving both left PICA and Icar territory with mass effect in the posterior fossa and fourth ventricle echocardiography was unremarkable.  There were no arrhythmias noted on the discharge summary as.  She wore a monitor for 4 weeks afterwards and no A-fib has been identified.  She is being treated with Plavix .  Carotid Doppler was negative.  She has had severe left facial droop.  She is bothered by numbness on the right side and weakness on the left.  She has loss of balance.  She has had some difficulty swallowing.  She has impaired vision on the left eye.  She is being seen at [facility].  She is made some recovery but her balance is her main issue as well as her vision, hearing and facial droop.  She has not felt any new palpitations although she occasionally has a skipped beat.  She has not had any new chest pressure, neck or arm discomfort.  She has had no weight gain or edema.  She is getting around with a walker.  ROS: As stated in the HPI and  negative for all other systems.  Studies Reviewed:    EKG:   10/18/2022 sinus rhythm with premature atrial contractions, premature ectopic complexes, low voltage in the limb leads, short PR interval, no acute ST-T wave changes, nonspecific ST-T wave changes.  Risk Assessment/Calculations:              Physical Exam:   VS:  BP 104/74   Pulse 75   Ht 5\' 6"  (1.676 m)   Wt 147 lb (66.7 kg)   SpO2 99%   BMI 23.73 kg/m    Wt Readings from Last 3 Encounters:  06/05/23 147 lb (66.7 kg)  06/04/23 150 lb (68 kg)  06/02/23 146 lb (66.2 kg)     GEN: Well nourished, well developed in no acute distress NECK: No JVD; No carotid bruits CARDIAC: RRR, no murmurs, rubs, gallops RESPIRATORY:  Clear to auscultation without rales, wheezing or rhonchi  ABDOMEN: Soft, non-tender, non-distended EXTREMITIES:  No edema; No deformity   ASSESSMENT AND PLAN:   Aortic valve calcium :   There was no aortic stenosis on echo Sept 2024.  No change in therapy.  Aortic atherosclerosis: She will continue with aggressive risk factor modification.    Coronary calcium :  She had a negative POET (Plain Old Exercise Treadmill).  She is not having any anginal symptoms.  No change in therapy.  Given this and her family history I would like to screen her with a POET (Plain Old Exercise Treadmill).  I will also pursue aggressive primary risk reduction.   Dyslipidemia: Her LDL was 40.  HDL 77.  CVA: She has had CVA as documented.  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    ILR is indicated and I will send her for outpatient implant.  She will continue with Plavix .   Follow up with me in 4 months.   Signed, Eilleen Grates, MD

## 2023-06-03 NOTE — ED Notes (Signed)
 Pt called x3 with no response. Per registration pt seen leaving ED.

## 2023-06-04 ENCOUNTER — Encounter
Payer: Commercial Managed Care - HMO | Attending: Physical Medicine and Rehabilitation | Admitting: Physical Medicine and Rehabilitation

## 2023-06-04 ENCOUNTER — Encounter: Payer: Self-pay | Admitting: Physical Medicine and Rehabilitation

## 2023-06-04 VITALS — BP 124/72 | HR 56 | Ht 66.0 in | Wt 150.0 lb

## 2023-06-04 DIAGNOSIS — I63532 Cerebral infarction due to unspecified occlusion or stenosis of left posterior cerebral artery: Secondary | ICD-10-CM | POA: Insufficient documentation

## 2023-06-04 DIAGNOSIS — H539 Unspecified visual disturbance: Secondary | ICD-10-CM | POA: Insufficient documentation

## 2023-06-04 DIAGNOSIS — R2689 Other abnormalities of gait and mobility: Secondary | ICD-10-CM | POA: Diagnosis present

## 2023-06-04 DIAGNOSIS — F909 Attention-deficit hyperactivity disorder, unspecified type: Secondary | ICD-10-CM | POA: Diagnosis not present

## 2023-06-04 DIAGNOSIS — I69398 Other sequelae of cerebral infarction: Secondary | ICD-10-CM | POA: Insufficient documentation

## 2023-06-04 MED ORDER — ESCITALOPRAM OXALATE 10 MG PO TABS: 10.0000 mg | ORAL_TABLET | Freq: Every day | ORAL | 3 refills | Status: AC

## 2023-06-04 MED ORDER — LIDOCAINE 5 % EX PTCH: 1.0000 | MEDICATED_PATCH | CUTANEOUS | 0 refills | Status: DC

## 2023-06-04 NOTE — Progress Notes (Addendum)
 Subjective:    Patient ID: Joan Robinson, female    DOB: Jan 06, 1961, 63 y.o.   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 Wynelle Heather note says that no afib was found on her cardiac monitor  4) Left sided deafness: -discussed that they are planning for a hearing aide  5) Impaired balance: -occurs when she gets here feet too close -feels drunk in the morning   Pain Inventory Average Pain 6 Pain Right Now 5 My pain is constant, burning, tingling, and aching  LOCATION OF PAIN  right mid back, right arm, right leg, right hip  BOWEL Number of stools per week: 14 or more  BLADDER Normal In and out cath, frequency .    Mobility walk with assistance how many minutes can you walk? 6 ability to climb steps?  yes do you drive?  no  Function disabled: date disabled 03/2023 retired I need assistance with the following:  dressing, meal prep, household duties, and shopping  Neuro/Psych bowel control problems weakness numbness tingling trouble walking dizziness anxiety  Prior Studies Any changes since last visit?  no  Physicians involved in your care Any changes since last visit?  no    Family History  Problem Relation Age of Onset   Heart disease Mother 63       stents   Stroke Mother    Hyperlipidemia Mother    Hypertension Mother    Other Mother        blood disorder- mgus   Colon polyps Father    Other Father        aorta disection   Aneurysm Father    Hypertension Sister        ?   Arthritis Sister    Other Sister         thyroid    Other Sister        thyroid    Stroke Maternal Aunt    Parkinson's disease Maternal Uncle    Colon cancer Paternal Uncle    Irritable bowel syndrome Daughter    Other Daughter        gastritis   Mental illness Daughter        bipolar and mood disorder   Mental illness Son        bipolar   Social History   Socioeconomic History   Marital status: Married    Spouse name: Not on file   Number of children: 2   Years of education: Not on file   Highest education level: Not on file  Occupational History   Occupation: Accounts Receivable  Tobacco Use   Smoking status: Former    Current packs/day: 0.00    Average packs/day: 1 pack/day for 30.0 years (30.0 ttl pk-yrs)    Types: Cigarettes    Start date: 05/16/1979    Quit date: 05/15/2009    Years since quitting: 14.0   Smokeless tobacco: Never  Vaping Use   Vaping status: Never Used  Substance and Sexual Activity   Alcohol  use: No  Drug use: No   Sexual activity: Never  Other Topics Concern   Not on file  Social History Narrative   Pt lives with husband    Retired    Teacher, early years/pre Strain: Low Risk  (02/26/2023)   Received from [facility]   Overall Financial Resource Strain (CARDIA)    Difficulty of Paying Living Expenses: Not very hard  Food Insecurity: No Food Insecurity (06/02/2023)   Received from [facility]   Hunger Vital Sign    Worried About Running Out of Food in the Last Year: Never true    Ran Out of Food in the Last Year: Never true  Transportation Needs: No Transportation Needs (06/02/2023)   Received from [facility] - Transportation    Lack of Transportation (Medical): No    Lack of Transportation (Non-Medical): No  Physical Activity: Insufficiently Active (06/02/2023)   Received from [facility]   Exercise Vital Sign    Days of Exercise per Week: 1 day    Minutes of Exercise per Session: 10 min  Stress: No Stress Concern Present (06/02/2023)    Received from [facility] - Occupational Stress Questionnaire    Feeling of Stress : Only a little  Social Connections: Socially Integrated (06/02/2023)   Received from [facility]   Social Network    How would you rate your social network (family, work, friends)?: Good participation with social networks   Past Surgical History:  Procedure Laterality Date   ABDOMINAL HYSTERECTOMY     ABDOMINAL HYSTERECTOMY  01/15/2009   complete   COLONOSCOPY     DILATION AND CURETTAGE OF UTERUS  01/16/1983   EYE SURGERY  03/03/2014   Surgery on both eyes for epiretinal membrane (vitreous peel)   Gated Spect wall motion stress cardiolite  11/05/2001   HIATAL HERNIA REPAIR N/A 07/12/2021   Procedure: LAPAROSCOPY W/ EXTENSIVE FOREGUT DISSECTION; PARTIAL STOMACH REDUCTION; GASTROSTOMY TUBE PLACEMENT; GASTROPEXY;  Surgeon: Jacolyn Matar, MD;  Location: [facility];  Service: General;  Laterality: N/A;   LAPAROSCOPIC RIGHT HEMI COLECTOMY Right 10/16/2022   Procedure: LAPAROSCOPIC ASSISTED RIGHT HEMI COLECTOMY;  Surgeon: Melvenia Stabs, MD;  Location: [facility] OR;  Service: General;  Laterality: Right;   POLYPECTOMY     TUBAL LIGATION  01/15/1993   UTERINE SUSPENSION     mesh   VITRECTOMY Bilateral 03/03/2014   XI ROBOTIC ASSISTED HIATAL HERNIA REPAIR N/A 05/01/2021   Procedure: XI ROBOTIC ASSISTED TYPE III HIATAL HERNIA REPAIR WITH FUNDOPLICATION;  Surgeon: Jacolyn Matar, MD;  Location: [facility];  Service: General;  Laterality: N/A;   Past Medical History:  Diagnosis Date   Allergic rhinitis    Anemia 10/10/2011   Anxiety and depression 01/17/2007   Qualifier: Diagnosis of  By: Washington Hacker MD, Sean A    Arthritis 07/24/2013   Likely inflammatory and following with Dr Roy Cordoba of [facility]  rheumatology   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   BCC (basal cell carcinoma of skin) 06/01/2012   Leg Follows with Dr Del Favia   Bipolar disorder (HCC) 01/17/2007   Qualifier:  Diagnosis of  By: Washington Hacker MD, Kaaren Ora A    Cataract    Diverticulosis    Diverticulosis    Emphysema of lung (HCC)    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   Gallstones    GERD (gastroesophageal reflux disease)    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   Hashimoto's disease    Hyperlipidemia    Hypertension  Hypothyroidism 08/24/2006   Qualifier: Diagnosis of  By: Washington Hacker MD, Sean A     IBS (irritable bowel syndrome) 07/27/2016   Obesity 11/01/2013   PUD (peptic ulcer disease)    Sleep apnea 04/27/2016   Tobacco abuse    Ht 5\' 6"  (1.676 m)   Wt 150 lb (68 kg)   BMI 24.21 kg/m   Opioid Risk Score:   Fall Risk Score:  `1  Depression screen [facility] 2/9     03/08/2023   10:09 AM 12/07/2022   11:58 AM 02/02/2017    2:39 PM 12/23/2015    2:13 PM 08/22/2015   12:37 PM 10/07/2014    1:55 PM 09/28/2014    4:11 PM  Depression screen PHQ 2/9  Decreased Interest 1 1 0 0 0 0 0  Down, Depressed, Hopeless 1 0 0 0 0 0 0  PHQ - 2 Score 2 1 0 0 0 0 0  Altered sleeping  1       Tired, decreased energy  2       Change in appetite  0       Feeling bad or failure about yourself   2       Trouble concentrating  0       Moving slowly or fidgety/restless  1       Suicidal thoughts  0       PHQ-9 Score  7       Difficult doing work/chores  Extremely dIfficult          Review of Systems  Musculoskeletal:        Pain is on the total right side of the body from shoulder down to knee.  Neurological:  Positive for dizziness, weakness and numbness.       Off balance  Psychiatric/Behavioral:  The patient is nervous/anxious.   All other systems reviewed and are negative.      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  -Discussed benefits of exercise in reducing pain. -Discussed following foods that may reduce pain: 1) Ginger (especially studied for arthritis)- reduce leukotriene production to decrease inflammation 2) Blueberries- high in phytonutrients that decrease inflammation 3) Salmon- marine omega-3s reduce joint swelling and pain 4) Pumpkin seeds- reduce inflammation 5) dark chocolate- reduces inflammation 6) turmeric- reduces inflammation 7) tart cherries - reduce pain and stiffness 8) extra virgin olive oil - its compound olecanthal helps to block prostaglandins  9) chili peppers- can be eaten or applied topically via capsaicin 10) mint- helpful for headache, muscle aches, joint pain, and itching 11) garlic- reduces inflammation  Link to further information on  diet for chronic pain: [email or web address]   6) Fatigue: -discussed likely stroke related -discussed that this has improved  7) shortness of breath: -discussed that she follows with cardiology -discussed that cardiac monitoring was normal  8) ADHD: -continue Adderall, discussed that this dose could be increased by her PCP  9) Impaired balance: -discussed working in the pool at her  gym  10) Right sided neuropathic pain: -Recommend topical CBD oil- discussed its benefits in reducing inflammation, pain, insomnia, and anxiety.  -Discussed that CBD oil differs from marijuana in that it does not contain THC- the substance that causes euphoria.  -Discussed that it is made from the hemp plant.  -It has been used for thousands of years -preliminary research suggests that if may be able to shrink cancerous tumors, stop plaque formation in Alzheimer's Disease, and slow the progress of brain disease from concussions.  -Additional benefits that have been demonstrated in studies include improved nausea, indigestion, and brain health, and reduced seizures.  -In a survey 92% of patients who tried medical cannabis felt it improved symptoms such as chronic pain, arthritis, migraines, and cancer.   -discussed that topamax , gabapentin, lyrica cause confusion, amitriptyline will likely cause the same symptoms  -discussed the is taking hydrocodone , discussed that an increased dose can be helpful  Lidocaine  5% prescribed

## 2023-06-04 NOTE — Addendum Note (Signed)
 Addended by: Liam Redhead on: 06/04/2023 10:42 AM   Modules accepted: Orders

## 2023-06-04 NOTE — Patient Instructions (Signed)
Recommend topical CBD oil- discussed its benefits in reducing inflammation, pain, insomnia, and anxiety.  °-Discussed that CBD oil differs from marijuana in that it does not contain THC- the substance that causes euphoria.  °-Discussed that it is made from the hemp plant.  °-It has been used for thousands of years °-preliminary research suggests that if may be able to shrink cancerous tumors, stop plaque formation in Alzheimer's Disease, and slow the progress of brain disease from concussions.  °-Additional benefits that have been demonstrated in studies include improved nausea, indigestion, and brain health, and reduced seizures.  °-In a survey 92% of patients who tried medical cannabis felt it improved symptoms such as chronic pain, arthritis, migraines, and cancer.  °  °

## 2023-06-05 ENCOUNTER — Encounter: Payer: Self-pay | Admitting: Cardiology

## 2023-06-05 ENCOUNTER — Ambulatory Visit (INDEPENDENT_AMBULATORY_CARE_PROVIDER_SITE_OTHER): Payer: BC Managed Care – PPO | Admitting: Cardiology

## 2023-06-05 VITALS — BP 104/74 | HR 75 | Ht 66.0 in | Wt 147.0 lb

## 2023-06-05 DIAGNOSIS — I639 Cerebral infarction, unspecified: Secondary | ICD-10-CM

## 2023-06-05 DIAGNOSIS — I359 Nonrheumatic aortic valve disorder, unspecified: Secondary | ICD-10-CM | POA: Diagnosis not present

## 2023-06-05 DIAGNOSIS — I6300 Cerebral infarction due to thrombosis of unspecified precerebral artery: Secondary | ICD-10-CM

## 2023-06-05 DIAGNOSIS — I700 Atherosclerosis of aorta: Secondary | ICD-10-CM

## 2023-06-05 DIAGNOSIS — R931 Abnormal findings on diagnostic imaging of heart and coronary circulation: Secondary | ICD-10-CM

## 2023-06-05 DIAGNOSIS — E785 Hyperlipidemia, unspecified: Secondary | ICD-10-CM | POA: Diagnosis not present

## 2023-06-05 NOTE — Patient Instructions (Signed)
 Medication Instructions:  The current medical regimen is effective;  continue present plan and medications.  *If you need a refill on your cardiac medications before your next appointment, please call your pharmacy*  You have been referred to Eletrophysiology to discuss loop recorder.  You will be contacted to be scheduled for this appointment.  Follow-Up: At [facility], you and your health needs are our priority.  As part of our continuing mission to provide you with exceptional heart care, our providers are all part of one team.  This team includes your primary Cardiologist (physician) and Advanced Practice Providers or APPs (Physician Assistants and Nurse Practitioners) who all work together to provide you with the care you need, when you need it.  Your next appointment:   6 month(s)  Provider:   Eilleen Grates, MD    We recommend signing up for the patient portal called "MyChart".  Sign up information is provided on this After Visit Summary.  MyChart is used to connect with patients for Virtual Visits (Telemedicine).  Patients are able to view lab/test results, encounter notes, upcoming appointments, etc.  Non-urgent messages can be sent to your provider as well.   To learn more about what you can do with MyChart, go to [email or web address].

## 2023-06-18 ENCOUNTER — Encounter: Payer: Self-pay | Admitting: Cardiology

## 2023-06-18 NOTE — Telephone Encounter (Signed)
  Please send pt instructions for loop recorder insertion.

## 2023-06-26 NOTE — Progress Notes (Signed)
 Electrophysiology Office Note:    Date:  06/27/2023   ID:  Joan Robinson, DOB April 02, 1960, MRN [id number]  PCP:  Sallye Crease, NP    HeartCare Providers Cardiologist:  Eilleen Grates, MD     Referring MD: Eilleen Grates, MD   History of Present Illness:    Joan Robinson is a 63 y.o. female with a medical history significant for strokem  aortic valve calcification and aortic atherosclerosis referred for loop recorder placement.     she was admitted in the fall 2024 with simultaneous stroke and a small bowel obstruction.  She had an MRI that showed a large acute infarct involving the left PICA and AICA territories territory infarct.  Discussed the use of AI scribe software for clinical note transcription with the patient, who gave verbal consent to proceed.       Today, she is at baseline.  No palpitations, no recent syncope, presyncope.  EKGs/Labs/Other Studies Reviewed Today:     Echocardiogram:  TTE 10/15/2022 EF 60-65%   Monitors:  27 day day monitor 01/2023  -- my interpretation Sinus rhythm, HR 35-120 bpm, avg 78 No AF detected   EKG:   EKG Interpretation Date/Time:  Thursday June 27 2023 08:27:10 EDT Ventricular Rate:  73 PR Interval:  112 QRS Duration:  84 QT Interval:  412 QTC Calculation: 453 R Axis:   -17  Text Interpretation: Normal sinus rhythm Cannot rule out Anteroseptal infarct , age undetermined When compared with ECG of 18-Oct-2022 08:13, No significant change Confirmed by Marlane Silver [phone]) on 06/27/2023 8:43:14 AM     Physical Exam:    VS:  BP 118/72   Pulse 73   Ht 5' 6 (1.676 m)   Wt 150 lb 12.8 oz (68.4 kg)   SpO2 97%   BMI 24.34 kg/m     Wt Readings from Last 3 Encounters:  06/27/23 150 lb 12.8 oz (68.4 kg)  06/05/23 147 lb (66.7 kg)  06/04/23 150 lb (68 kg)     GEN: Well nourished, well developed in no acute distress CARDIAC: RRR, no murmurs, rubs, gallops RESPIRATORY:  Normal work of  breathing MUSCULOSKELETAL: no edema    ASSESSMENT & PLAN:     Stroke involving PICA and AICA territories Possibly cardioembolic Loop recorder has been requested  I explained the rationale and indication for the procedure.  I explained the logistics.  I explained risks of minor infection minor bleeding.  I explained the monitoring process with the associated monthly fee.  Using a shared decision making approach, we decided to proceed with implant today.  SURGEON:  Efraim Grange, MD     PREPROCEDURE DIAGNOSIS:  Cryptogenic stroke    POSTPROCEDURE DIAGNOSIS:  Cryptogenic stroke     PROCEDURES:   1. Implantable loop recorder implantation    INTRODUCTION:   Ourania Hamler Robinson  is a 63 y.o. patient with a history of cryptogenic stroke. Inpatient telemetry has been reviewed and not shown atrial fibrillation. The patient therefore presents today for implantable loop implantation. The costs of loop recorder monitoring have been discussed with the patient.    DESCRIPTION OF PROCEDURE:  Informed written consent was obtained.  A timeout was performed. The patient required no sedation for the procedure today.  The patients left chest was prepped and draped in the usual sterile fashion. The skin overlying the left parasternal region was infiltrated with lidocaine  for local analgesia.  A 0.5-cm incision was made over the left parasternal region over the 3rd  intercostal space.  A Medtronic Linq II (SN [id number] implantable loop recorder was then placed into the pocket  R waves were very prominent and measured >0.63mV.  Steri- Strips and a sterile dressing were then applied.  There were no early apparent complications.     CONCLUSIONS:   1. Successful implantation of a Medtronic Ling II implantable loop recorder for a history of cryptogenic stroke  2. No early apparent complications.    Signed, Efraim Grange, MD  06/27/2023 8:43 AM    [facility]

## 2023-06-27 ENCOUNTER — Encounter: Payer: Self-pay | Admitting: Cardiovascular Disease

## 2023-06-27 ENCOUNTER — Ambulatory Visit: Attending: Cardiovascular Disease | Admitting: Cardiovascular Disease

## 2023-06-27 VITALS — BP 118/72 | HR 73 | Ht 66.0 in | Wt 150.8 lb

## 2023-06-27 DIAGNOSIS — I6300 Cerebral infarction due to thrombosis of unspecified precerebral artery: Secondary | ICD-10-CM | POA: Insufficient documentation

## 2023-06-27 NOTE — Patient Instructions (Signed)
 Medication Instructions:  Your physician recommends that you continue on your current medications as directed. Please refer to the Current Medication list given to you today.  Labwork: None ordered.  Testing/Procedures: None ordered.  Follow-Up:  Your physician wants you to follow-up in: one year with one of our EP APP's that work alongside of Dr Arlester Ladd.  You will receive a reminder letter in the mail two months in advance. If you don't receive a letter, please call our office to schedule the follow-up appointment.    Implantable Loop Recorder Placement, Care After This sheet gives you information about how to care for yourself after your procedure. Your health care provider may also give you more specific instructions. If you have problems or questions, contact your health care provider. What can I expect after the procedure? After the procedure, it is common to have: Soreness or discomfort near the incision. Some swelling or bruising near the incision.  Follow these instructions at home: Incision care  Monitor your cardiac device site for redness, swelling, and drainage. Call the device clinic at [phone] if you experience these symptoms or fever/chills.  Keep the large square bandage on your site for 24 hours and then you may remove it yourself. Keep the steri-strips underneath in place.   You may shower after 72 hours / 3 days from your procedure with the steri-strips in place. They will usually fall off on their own, or may be removed after 10 days. Pat dry.   Avoid lotions, ointments, or perfumes over your incision until it is well-healed.  Please do not submerge in water  until your site is completely healed.   Your device is MRI compatible.   Remote monitoring is used to monitor your cardiac device from home. This monitoring is scheduled every month by our office. It allows us  to keep an eye on the function of your device to ensure it is working properly.  If your  wound site starts to bleed apply pressure.    For help with the monitor please call Medtronic Monitor Support Specialist directly at [phone].    If you have any questions/concerns please call the device clinic at [phone].  Activity  Return to your normal activities.  General instructions Follow instructions from your health care provider about how to manage your implantable loop recorder and transmit the information. Learn how to activate a recording if this is necessary for your type of device. You may go through a metal detection gate, and you may let someone hold a metal detector over your chest. Show your ID card if needed. Do not have an MRI unless you check with your health care provider first. Take over-the-counter and prescription medicines only as told by your health care provider. Keep all follow-up visits as told by your health care provider. This is important. Contact a health care provider if: You have redness, swelling, or pain around your incision. You have a fever. You have pain that is not relieved by your pain medicine. You have triggered your device because of fainting (syncope) or because of a heartbeat that feels like it is racing, slow, fluttering, or skipping (palpitations). Get help right away if you have: Chest pain. Difficulty breathing. Summary After the procedure, it is common to have soreness or discomfort near the incision. Change your dressing as told by your health care provider. Follow instructions from your health care provider about how to manage your implantable loop recorder and transmit the information. Keep all follow-up visits as told by  your health care provider. This is important. This information is not intended to replace advice given to you by your health care provider. Make sure you discuss any questions you have with your health care provider. Document Released: 12/13/2014 Document Revised: 02/16/2017 Document Reviewed:  02/16/2017 Elsevier Patient Education  2020 [employer].

## 2023-07-08 ENCOUNTER — Other Ambulatory Visit: Payer: Self-pay | Admitting: Internal Medicine

## 2023-07-08 ENCOUNTER — Other Ambulatory Visit: Payer: Self-pay | Admitting: Cardiology

## 2023-07-09 ENCOUNTER — Encounter: Payer: Self-pay | Admitting: Internal Medicine

## 2023-07-09 ENCOUNTER — Ambulatory Visit (INDEPENDENT_AMBULATORY_CARE_PROVIDER_SITE_OTHER): Admitting: Internal Medicine

## 2023-07-09 VITALS — BP 120/78 | HR 65 | Ht 66.0 in | Wt 151.0 lb

## 2023-07-09 DIAGNOSIS — K219 Gastro-esophageal reflux disease without esophagitis: Secondary | ICD-10-CM | POA: Diagnosis not present

## 2023-07-09 DIAGNOSIS — K625 Hemorrhage of anus and rectum: Secondary | ICD-10-CM

## 2023-07-09 DIAGNOSIS — K449 Diaphragmatic hernia without obstruction or gangrene: Secondary | ICD-10-CM

## 2023-07-09 DIAGNOSIS — Z860100 Personal history of colon polyps, unspecified: Secondary | ICD-10-CM

## 2023-07-09 DIAGNOSIS — Z860101 Personal history of adenomatous and serrated colon polyps: Secondary | ICD-10-CM

## 2023-07-09 DIAGNOSIS — Z8719 Personal history of other diseases of the digestive system: Secondary | ICD-10-CM

## 2023-07-09 DIAGNOSIS — K562 Volvulus: Secondary | ICD-10-CM | POA: Diagnosis not present

## 2023-07-09 DIAGNOSIS — Z9049 Acquired absence of other specified parts of digestive tract: Secondary | ICD-10-CM

## 2023-07-09 NOTE — Patient Instructions (Signed)
 Please call back in a week or so to schedule a follow up for the end of September.  _______________________________________________________  If your blood pressure at your visit was 140/90 or greater, please contact your primary care physician to follow up on this.  _______________________________________________________  If you are age 63 or older, your body mass index should be between 23-30. Your Body mass index is 24.37 kg/m. If this is out of the aforementioned range listed, please consider follow up with your Primary Care Provider.  If you are age 84 or younger, your body mass index should be between 19-25. Your Body mass index is 24.37 kg/m. If this is out of the aformentioned range listed, please consider follow up with your Primary Care Provider.   ________________________________________________________  The [facility] providers would like to encourage you to use MYCHART to communicate with providers for non-urgent requests or questions.  Due to long hold times on the telephone, sending your provider a message by [facility] may be a faster and more efficient way to get a response.  Please allow 48 business hours for a response.  Please remember that this is for non-urgent requests.  _______________________________________________________

## 2023-07-09 NOTE — Progress Notes (Signed)
 HISTORY OF PRESENT ILLNESS:  Joan Robinson is a 63 y.o. female with multiple significant medical problems who has been followed in this office for GERD, complicated hiatal hernia requiring surgery with redo, and colon polyps.  She presents today with her husband, Ozell, after an isolated episode of painless rectal bleeding.  I last saw the patient in the office on September 04, 2018 for.  That dictation for details.  Since that time she has had multiple significant medical problems.  She was hospitalized October 2024 with severe abdominal pain.  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  Prior to surgery, she developed a stroke.  Subsequently underwent right hemicolectomy.  Now on Plavix .  Significant residual deficits with slow recovery.  These include left ear deafness, left eye blindness, left facial droop, imbalance, and weakness.  Recent loop recorder placement.  Seeing ophthalmology.  About 2 months ago she had a bowel movement.  Upon wiping she noted red blood on the tissue and red blood in the bowl.  No problems prior or since.  Last hemoglobin May 21, 2023 was 10.2.  10.6 last fall.  Last complete colonoscopy February 17, 2021.  Sigmoid diverticulosis.  Otherwise normal.  Follow-up in 5 years recommended due to history of multiple and advanced adenomatous polyps.  Patient's GI review of system is negative otherwise.  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   BCC (basal cell carcinoma of skin) 06/01/2012   Leg Follows with Dr Shona   Bipolar disorder (HCC) 01/17/2007   Qualifier: Diagnosis of  By: Kassie MD, Sean A     Cataract    Diverticulosis    Diverticulosis    Emphysema of lung (HCC)    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   Gallstones    GERD (gastroesophageal reflux disease)    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   Hashimoto's disease    Hyperlipidemia    Hypertension    Hypothyroidism 08/24/2006   Qualifier: Diagnosis of  By: Kassie MD, Sean A     IBS (irritable bowel syndrome) 07/27/2016   Obesity 11/01/2013   PUD (peptic ulcer disease)    Sleep apnea 04/27/2016   Tobacco abuse     Past Surgical History:  Procedure Laterality Date   ABDOMINAL HYSTERECTOMY     ABDOMINAL HYSTERECTOMY  01/15/2009   complete   COLONOSCOPY     DILATION AND CURETTAGE OF UTERUS  01/16/1983   EYE SURGERY  03/03/2014   Surgery on both eyes for epiretinal membrane (vitreous peel)   Gated Spect wall motion stress cardiolite  11/05/2001   HIATAL HERNIA REPAIR N/A 07/12/2021   Procedure: LAPAROSCOPY W/ EXTENSIVE FOREGUT DISSECTION; PARTIAL STOMACH REDUCTION; GASTROSTOMY TUBE PLACEMENT; GASTROPEXY;  Surgeon: Gladis Cough, MD;  Location: [facility];  Service: General;  Laterality: N/A;   LAPAROSCOPIC RIGHT HEMI COLECTOMY Right 10/16/2022   Procedure: LAPAROSCOPIC ASSISTED RIGHT HEMI COLECTOMY;  Surgeon: Teresa Lonni HERO, MD;  Location: [facility];  Service: General;  Laterality: Right;   POLYPECTOMY     TUBAL LIGATION  01/15/1993   UTERINE SUSPENSION     mesh   VITRECTOMY Bilateral 03/03/2014  XI ROBOTIC ASSISTED HIATAL HERNIA REPAIR N/A 05/01/2021   Procedure: XI ROBOTIC ASSISTED TYPE III HIATAL HERNIA REPAIR WITH FUNDOPLICATION;  Surgeon: Gladis Cough, MD;  Location: [facility];  Service: General;  Laterality: N/A;    Social History [place] Kogler  reports that she quit smoking about 14 years ago. Her smoking use included cigarettes. She started smoking about 44 years ago. She has a 30 pack-year smoking history. She has never used smokeless tobacco. She reports that she does not drink alcohol  and does not use  drugs.  family history includes AAA (abdominal aortic aneurysm) in her sister; Aneurysm in her father; Arthritis in her sister; Colon cancer in her paternal uncle; Colon polyps in her father; Heart disease (age of onset: 73) in her mother; Hyperlipidemia in her mother and sister; Hypertension in her mother and sister; Irritable bowel syndrome in her daughter; Mental illness in her daughter and son; Other in her daughter, father, mother, sister, and sister; Parkinson's disease in her maternal uncle; Stroke in her maternal aunt and mother.  Allergies  Allergen Reactions   Aspirin Shortness Of Breath   Nsaids Shortness Of Breath   Orphenadrine Palpitations   Cephalexin Diarrhea and Other (See Comments)   Dicyclomine Hcl Other (See Comments)    REACTION: mouth ulcers   Duloxetine  Hcl Rash   Lifitegrast Other (See Comments)    Burned eyes   Quetiapine  Other (See Comments)    Somnolence. Slept for 36 hours straight.   Butamben-Tetracaine-Benzocaine      Mouth sores   Dicyclomine Hcl     REACTION: mouth ulcers   Metronidazole Hives    mouth ulcers   Phenergan [Promethazine Hcl] Other (See Comments)    knocks pt out for about 3 days   Promethazine     Other Reaction(s): Fatigue   Moxifloxacin Nausea Only and Palpitations    REACTION: increased heart rate   Moxifloxacin Hcl In Nacl Palpitations   Orphenadrine Citrate Palpitations   Sulfamethoxazole -Trimethoprim  Nausea Only and Palpitations    REACTION: increased heart rate, nausea       PHYSICAL EXAMINATION: Vital signs: BP 120/78   Pulse 65   Ht 5' 6 (1.676 m)   Wt 151 lb (68.5 kg)   BMI 24.37 kg/m   Constitutional: Chronically ill-appearing, patch on left eye, left facial droop, no acute distress.  Cane Psychiatric: alert and oriented x3, cooperative Eyes: Right eye extraocular movements intact, anicteric, conjunctiva pink.  Left eye patch Mouth: oral pharynx moist, no lesions Neck: supple no  lymphadenopathy Cardiovascular: heart regular rate and rhythm, no murmur Lungs: clear to auscultation bilaterally Abdomen: soft, nontender, nondistended, no obvious ascites, no peritoneal signs, normal bowel sounds, no organomegaly Rectal: Omitted Extremities: no clubbing, cyanosis, or lower extremity edema bilaterally Skin: no relevant lesions on visible extremities Neuro: Hearing and visual deficits.  Weakness left greater than right  ASSESSMENT:  1.  Isolated episode of minor rectal bleeding.  Likely internal hemorrhoids (noted on colonoscopy images). 2.  History of adenomatous colon polyps.  Last colonoscopy February 2023.  No neoplasia. 3.  GERD 4.  Hiatal hernia. 5.  Cecal volvulus status post right hemicolectomy 6.  Multiple significant medical problems.  Chronic Plavix .   PLAN:  1.  Observation 2.  No plans for colonoscopy 3.  Routine GI office checkup 3 months 4.  Contact the office in the interim for any questions or problems 5.  Continue PPI 6.  Ongoing general medical care with PCP and multiple specialists Total time of 35  minutes was spent preparing to see the patient, obtaining comprehensive history, performing medically appropriate physical exam, counseling and educating the patient and her husband regarding the above listed issues, arranging follow-up, and documenting clinical information in the health record

## 2023-07-16 ENCOUNTER — Encounter: Payer: Self-pay | Admitting: Neurology

## 2023-07-26 ENCOUNTER — Ambulatory Visit: Admitting: Internal Medicine

## 2023-07-28 ENCOUNTER — Ambulatory Visit

## 2023-07-28 DIAGNOSIS — I6300 Cerebral infarction due to thrombosis of unspecified precerebral artery: Secondary | ICD-10-CM

## 2023-07-29 LAB — CUP PACEART REMOTE DEVICE CHECK
Date Time Interrogation Session: 20250713143135
Implantable Pulse Generator Implant Date: 20250612

## 2023-07-31 ENCOUNTER — Ambulatory Visit: Payer: Self-pay | Admitting: Cardiovascular Disease

## 2023-09-25 ENCOUNTER — Ambulatory Visit: Admitting: Adult Health

## 2023-10-02 NOTE — Progress Notes (Unsigned)
 [facility] [address] [place]. [place] [address] [phone]       OFFICE FOLLOW-UP VISIT NOTE  Joan Robinson Date of Birth:  10/08/1960 Medical Record Number:  [id number]   Primary neurologist: Dr. Rosemarie Reason for visit: Stroke follow-up   No chief complaint on file.     HPI:   Update 10/03/2023 JM: Patient returns for follow-up visit accompanied by her husband.  She has been stable from a stroke standpoint without new stroke/TIA symptoms.  Continued left hemiataxia, left peripheral nerve palsy, and dysphagia.  Did undergo cochlear implant 9/5 without complication.  Continues to follow with [facility] ophthalmology routinely.  Underwent loop recorder placement by cardiology in June, download in July without evidence of A-fib, scheduled for repeat download in November.  Remains on Plavix  and Crestor  without side effects.  Routinely follows with PCP for stroke risk factor management.      History provided for reference purposes only Update 03/20/2023 Dr. Rosemarie; she returns for follow-up after her last visit 3 months ago.  She is accompanied by her husband.  She states she is doing well.  Her balance is better but she still occasionally gets off balance particularly when she makes a quick turn.  She can walk short distances but prefers to use a wheeled walker for long distances so that she does not have to grab at things.  She has had a few near falls but no injuries.  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  She remains on Plavix  which is tolerating well with minor bruising and no bleeding.  She is tolerating Crestor  well without muscle aches and pains.  She did wear a 30-day heart monitor in January this year which showed no evidence of paroxysmal A-fib.  She has had no recurrent  stroke or TIA symptoms.  She has no new complaints today.    Initial consult 12/11/2022 Dr. Rosemarie: Joan Robinson is a 63 year old pleasant Caucasian lady seen today for initial office consultation visit for stroke.  She is accompanied by her sister and daughter and history is obtained from them and review of electronic medical records.  I personally reviewed pertinent available imaging films in PACS.  She is a  63 y.o. female with past medical history of esophageal strictures status post dilation, multiple hernias status post multiple surgeries, GERD/PUD, hypertension, hyperlipidemia, autoimmune urticaria, emphysema, sleep apnea who presents to the ED on 10/13/2022 with right upper quadrant and right lower quadrant abdominal pain x 2 days associated nausea.  She was found to have small bowel obstruction and was being managed inpatient at [facility].  Around shift change, patient was noted to have left facial droop, slurring of her speech and upon further questioning, symptoms were first noted at 1700 on 10/14/2022.  Her team had difficulty establishing a last known well.  However, symptoms were first noted at 1700.  A code stroke was activated if she had a CT head without contrast which was notable for a moderately large left PICA territory infarct.  She was eval by teleneurology.  She was not given TNKase due to unclear last known well and the fact that the stroke looks completed.  CT angio head and neck with and without contrast was negative for any large vessel occlusion.  Specifically, no  basilar occlusion or thrombosis noted.  CT perfusion with no core or mismatch noted.  Given the moderately large posterior fossa stroke which is in proximity to brainstem, and the extension of the stroke into the brainstem, she was transferred to [facility] medical ICU for immediate in person neurology evaluation and for close monitoring for signs of posterior fossa crowding and potential herniation.  She was monitored  carefully in the ICU and remained stable and did not require any neurosurgical intervention.  MRI showed a large acute infarct involving both left PICA and Icar territory with mass effect on posterior fossa and fourth ventricle mild hydrocephalus.  2D echo showed ejection fraction of 60 to 65%.  Left atrial size was normal.  LDL cholesterol 54 mg percent.  Hemoglobin A1c was 5.6.  Patient had significant left peripheral facial nerve weakness as well as dysphagia and left hemiataxia.  She was seen by physical Occupational Therapy transfer to inpatient rehab where she stayed for a month.  She made gradual improvement in his been home now for nearly 4 weeks.  She still has significant left facial weakness with dryness of the left eye with redness.  She is unable to close her left eye.  She does have an upcoming appointment to see ophthalmologist next week.  She is still not gotten any hearing back in the left ear.  She still has trouble swallowing has to swallow mostly on the right side.  She has a lot of drooling from the left corner of the mouth.  She still has some left-sided incoordination and she is working with therapist has shown some improvement.  She is still unable to walk on her own and requires 1 person assist with gait belt to walk with a wheelchair.  She is tolerating Plavix  with minor bruising but no bleeding.  Patient is clearly significantly disabled and is currently on short-term disability and thinking about going on a long disability.  Patient also has a new complaint of neck pain and right arm numbness.  Her primary care physician has ordered an MRI of the C-spine is scheduled for next week for this.  Last CT head on 10/28/2022 had shown decrease mass effect in the posterior fossa with improved patency of the fourth ventricle.      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   BCC (basal cell carcinoma of skin) 06/01/2012   Leg Follows with Dr Shona   Bipolar disorder (HCC) 01/17/2007   Qualifier: Diagnosis of  By: Kassie MD, Alyce A    Cataract    Diverticulosis    Diverticulosis    Emphysema of lung (HCC)    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   Gallstones    GERD (gastroesophageal reflux disease)    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   Hashimoto's disease    Hyperlipidemia    Hypertension    Hypothyroidism 08/24/2006   Qualifier: Diagnosis of  By: Kassie MD, Sean A     IBS (irritable bowel syndrome) 07/27/2016   Obesity 11/01/2013   PUD (peptic  ulcer disease)    Sleep apnea 04/27/2016   Tobacco abuse     Social History:  Social History   Socioeconomic History   Marital status: Married    Spouse name: Ozell   Number of children: 2   Years of education: Not on file   Highest education level: Not on file  Occupational History   Occupation: Disabled  Tobacco Use   Smoking status: Former    Current packs/day: 0.00    Average packs/day: 1 pack/day for 30.0 years (30.0 ttl pk-yrs)    Types: Cigarettes    Start date: 05/16/1979    Quit date: 05/15/2009    Years since quitting: 14.3   Smokeless tobacco: Never  Vaping Use   Vaping status: Never Used  Substance and Sexual Activity   Alcohol  use: No   Drug use: No   Sexual activity: Never  Other Topics Concern   Not on file  Social History Narrative   Pt lives with husband    Retired    Chief Executive Officer Drivers of Corporate investment banker Strain: Low Risk  (02/26/2023)   Received from [facility]   Overall Financial Resource Strain (CARDIA)     Difficulty of Paying Living Expenses: Not very hard  Food Insecurity: No Food Insecurity (09/20/2023)   Received from [facility]   Hunger Vital Sign    Within the past 12 months, you worried that your food would run out before you got the money to buy more.: Never true    Within the past 12 months, the food you bought just didn't last and you didn't have money to get more.: Never true  Transportation Needs: No Transportation Needs (09/20/2023)   Received from [facility] - Transportation    In the past 12 months, has lack of transportation kept you from medical appointments or from getting medications?: No    In the past 12 months, has lack of transportation kept you from meetings, work, or from getting things needed for daily living?: No  Physical Activity: Insufficiently Active (06/02/2023)   Received from [facility]   Exercise Vital Sign    On average, how many days per week do you engage in moderate to strenuous exercise (like a brisk walk)?: 1 day    On average, how many minutes do you engage in exercise at this level?: 10 min  Stress: No Stress Concern Present (09/20/2023)   Received from [facility] Occupational Health - Occupational Stress Questionnaire    Do you feel stress - tense, restless, nervous, or anxious, or unable to sleep at night because your mind is troubled all the time - these days?: Only a little  Social Connections: Socially Integrated (06/02/2023)   Received from [facility]   Social Network    How would you rate your social network (family, work, friends)?: Good participation with social networks  Intimate Partner Violence: Not At Risk (09/20/2023)   Received from [facility]   HITS    Over the last 12 months how often did your partner physically hurt you?: Never    Over the last 12 months how often did your partner insult you or talk down to you?: Never    Over the last 12 months how often did your partner threaten you  with physical harm?: Never    Over the last 12 months how often did your partner scream or curse at you?: Never    Medications:   Current  Outpatient Medications on File Prior to Visit  Medication Sig Dispense Refill   acetaminophen  (TYLENOL ) 325 MG tablet Take 1-2 tablets (325-650 mg total) by mouth every 4 (four) hours as needed for mild pain (pain score 1-3).     amphetamine -dextroamphetamine  (ADDERALL) 20 MG tablet Take by mouth.     amphetamine -dextroamphetamine  (ADDERALL) 20 MG tablet Take 20 mg by mouth.     artificial tears (LACRILUBE) OINT ophthalmic ointment Place into the left eye 2 (two) times daily. 3.5 g 1   Cholecalciferol (D 1000) 25 MCG (1000 UT) capsule Take 1,000 Units by mouth daily.     Cholecalciferol (VITAMIN D3) 100000 UNIT/GM POWD Take 1,000 Units by mouth.     clobetasol (TEMOVATE) 0.05 % external solution Apply topically 2 (two) times daily.     clopidogrel  (PLAVIX ) 75 MG tablet Take 1 tablet (75 mg total) by mouth daily. 30 tablet 0   escitalopram  (LEXAPRO ) 10 MG tablet Take 1 tablet (10 mg total) by mouth daily. (Patient taking differently: Take 30 mg by mouth daily.) 90 tablet 3   HYDROcodone -acetaminophen  (NORCO) 10-325 MG tablet Take 1 tablet by mouth.     latanoprost  (XALATAN ) 0.005 % ophthalmic solution 1 drop into affected eye in the evening Ophthalmic Once a day     levothyroxine  (SYNTHROID ) 88 MCG tablet Take 88 mcg by mouth.     nitroGLYCERIN  (NITROSTAT ) 0.4 MG SL tablet Place 0.4 mg under the tongue every 5 (five) minutes as needed for chest pain.     ofloxacin (OCUFLOX) 0.3 % ophthalmic solution Place 1 drop into the left eye 4 (four) times daily.     ondansetron  (ZOFRAN ) 4 MG tablet Take 4 mg by mouth.     pantoprazole  (PROTONIX ) 40 MG tablet Take 1 tablet (40 mg total) by mouth daily. Office visit for further refills 90 tablet 0   polyvinyl alcohol  (LIQUIFILM TEARS) 1.4 % ophthalmic solution Apply 2 drops to eye.     rosuvastatin  (CRESTOR ) 20 MG tablet  Take 1 tablet (20 mg total) by mouth daily. 90 tablet 3   traZODone  (DESYREL ) 150 MG tablet Take 150 mg by mouth.     No current facility-administered medications on file prior to visit.    Allergies:   Allergies  Allergen Reactions   Aspirin Shortness Of Breath   Nsaids Shortness Of Breath   Orphenadrine Palpitations   Cephalexin Diarrhea and Other (See Comments)   Dicyclomine Hcl Other (See Comments)    REACTION: mouth ulcers   Duloxetine  Hcl Rash   Lifitegrast Other (See Comments)    Burned eyes   Quetiapine  Other (See Comments)    Somnolence. Slept for 36 hours straight.   Butamben-Tetracaine-Benzocaine      Mouth sores   Dicyclomine Hcl     REACTION: mouth ulcers   Metronidazole Hives    mouth ulcers   Phenergan [Promethazine Hcl] Other (See Comments)    knocks pt out for about 3 days   Promethazine     Other Reaction(s): Fatigue   Moxifloxacin Nausea Only and Palpitations    REACTION: increased heart rate   Moxifloxacin Hcl In Nacl Palpitations   Orphenadrine Citrate Palpitations   Sulfamethoxazole -Trimethoprim  Nausea Only and Palpitations    REACTION: increased heart rate, nausea    Physical Exam General: well developed, well nourished pleasant middle-age Caucasian lady, seated, in no evident distress Head: head normocephalic and atraumatic.   Neck: supple with no carotid or supraclavicular bruits Cardiovascular: regular rate and rhythm, no murmurs Musculoskeletal: no deformity Skin:  no rash/petichiae Vascular:  Normal pulses all extremities Redness of the left eye with slight corneal haziness. Neurologic Exam Mental Status: Awake and fully alert. Oriented to place and time. Recent and remote memory intact. Attention span, concentration and fund of knowledge appropriate. Mood and affect appropriate.  Cranial Nerves: Fundoscopic exam deferred.  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  She has significantly diminished vision in the  left eye.SABRA Extraocular movements full without nystagmus. Visual fields full to confrontation. Hearing loss in left ear.  Facial sensation diminished on the right face.  Severe left peripheral pattern 7th nerve weakness including forehead eyelids and lower face., tongue, palate moves normally and symmetrically.  Motor: Normal bulk and tone. Normal strength in all tested extremity muscles. Sensory.:  Diminished sensation in the right hand.  Normal elsewhere. Coordination: Minimal left hemiataxia arm greater than leg.. Gait and Station: Deferred as patient is unable even with 1 person assist.  Reflexes: 1+ and symmetric. Toes downgoing.       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  She has significant residual disabling left peripheral nerve palsy, hearing loss, dysphagia and left hemiataxia.     PLAN:  - Continue Plavix  75 mg daily and Crestor  20 mg daily for secondary stroke prevention managed/prescribed by PCP - Continue close PCP follow-up for aggressive stroke risk factor management - Loop recorder will continue to be monitored by cardiology, has not shown atrial fibrillation thus far. - Stroke labs 03/2023: LDL 40, A1c 5.7   II had a long d/w patient and her husband about her  recent cerebellar and brainstem stroke, risk for recurrent stroke/TIAs, personally independently reviewed imaging studies and stroke evaluation results and answered questions.Continue Plavix  75 mg daily for secondary stroke prevention and maintain strict control of hypertension with blood pressure goal below 130/90, diabetes with hemoglobin A1c goal below 6.5% and lipids with LDL cholesterol goal below 70 mg/dL. I also advised the patient to eat a healthy diet with plenty of whole grains, cereals, fruits and vegetables, exercise regularly and maintain ideal body weight.   Patient is scheduled to undergo cochlear implant for her left ear deafness and may hold Plavix  for 3 to 5 days prior to the procedure with a small but acceptable periprocedural risk of stroke/TIA if patient is willing.  Resume Plavix  after the procedure when safe.  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  Check screening carotid ultrasound, lipid profile and hemoglobin A1c.  Patient is significantly disabled from her stroke and will not be able to return to work anytime soon and I filled out disability paperwork for her upon her request..  Followup in the future with with my nurse practitioner in 6 months months or call earlier if necessary    I personally spent a total of *** minutes in the care of the patient today including {Time Based Coding:210964241}.  Harlene Bogaert, AGNP-BC  [facility] [address] [place], [place] [address]  Phone [phone] Fax [phone] Note: This document was prepared with digital dictation and possible smart phrase technology. Any transcriptional errors that result from this process are unintentional.

## 2023-10-03 ENCOUNTER — Encounter: Payer: Self-pay | Admitting: Adult Health

## 2023-10-03 ENCOUNTER — Ambulatory Visit: Admitting: Adult Health

## 2023-10-03 VITALS — BP 120/75 | HR 80 | Ht 66.0 in | Wt 152.0 lb

## 2023-10-03 DIAGNOSIS — G510 Bell's palsy: Secondary | ICD-10-CM | POA: Diagnosis not present

## 2023-10-03 DIAGNOSIS — R29818 Other symptoms and signs involving the nervous system: Secondary | ICD-10-CM | POA: Diagnosis not present

## 2023-10-03 DIAGNOSIS — I639 Cerebral infarction, unspecified: Secondary | ICD-10-CM | POA: Diagnosis not present

## 2023-10-03 DIAGNOSIS — G119 Hereditary ataxia, unspecified: Secondary | ICD-10-CM | POA: Diagnosis not present

## 2023-10-03 DIAGNOSIS — H9192 Unspecified hearing loss, left ear: Secondary | ICD-10-CM

## 2023-10-03 NOTE — Patient Instructions (Signed)
 Continue to do exercises as advised during therapy sessions and continue close follow-up with Dr. Lorilee   Continue to follow with other specialties as scheduled  Continue clopidogrel  75 mg daily  and Crestor   for secondary stroke prevention  Continue to follow up with PCP regarding blood pressure and cholesterol management  Maintain strict control of hypertension with blood pressure goal below 130/90, diabetes with hemoglobin A1c goal below 7.0 % and cholesterol with LDL cholesterol (bad cholesterol) goal below 70 mg/dL.   Signs of a Stroke? Follow the BEFAST method:  Balance Watch for a sudden loss of balance, trouble with coordination or vertigo Eyes Is there a sudden loss of vision in one or both eyes? Or double vision?  Face: Ask the person to smile. Does one side of the face droop or is it numb?  Arms: Ask the person to raise both arms. Does one arm drift downward? Is there weakness or numbness of a leg? Speech: Ask the person to repeat a simple phrase. Does the speech sound slurred/strange? Is the person confused ? Time: If you observe any of these signs, call 911.        Thank you for coming to see us  at [facility]. I hope we have been able to provide you high quality care today.  You may receive a patient satisfaction survey over the next few weeks. We would appreciate your feedback and comments so that we may continue to improve ourselves and the health of our patients.

## 2023-10-04 ENCOUNTER — Encounter: Attending: Physical Medicine and Rehabilitation | Admitting: Physical Medicine and Rehabilitation

## 2023-10-04 VITALS — BP 107/73 | HR 74 | Ht 66.0 in | Wt 152.0 lb

## 2023-10-04 DIAGNOSIS — R0602 Shortness of breath: Secondary | ICD-10-CM | POA: Diagnosis not present

## 2023-10-04 DIAGNOSIS — R5383 Other fatigue: Secondary | ICD-10-CM | POA: Diagnosis present

## 2023-10-04 DIAGNOSIS — I63532 Cerebral infarction due to unspecified occlusion or stenosis of left posterior cerebral artery: Secondary | ICD-10-CM | POA: Insufficient documentation

## 2023-10-04 DIAGNOSIS — R451 Restlessness and agitation: Secondary | ICD-10-CM | POA: Insufficient documentation

## 2023-10-04 DIAGNOSIS — K117 Disturbances of salivary secretion: Secondary | ICD-10-CM | POA: Diagnosis present

## 2023-10-04 DIAGNOSIS — R630 Anorexia: Secondary | ICD-10-CM | POA: Insufficient documentation

## 2023-10-04 NOTE — Patient Instructions (Addendum)
 NAC Liposomal glutathione  MSM and boswellia serrata for joint pain  Magneisum glycinate 250mg 

## 2023-10-04 NOTE — Progress Notes (Signed)
 Subjective:    Patient ID: Joan Robinson, female    DOB: 1960-03-08, 63 y.o.   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 Rosemarie from neurology released her -she and her husband joined a gym and they have aqutherapy and chair yoga -no falls -nervous about walking with others because she is nervous about falling -balance is still off -received disability  2) Fatigue: -she asks of this is due to the stroke  3) Shortness of breath: -discussed that Dr. Bucky note says that no afib was found on her cardiac monitor -had loop recorder placed  4) Left sided deafness: -discussed that they are planning for a hearing aide  5) Impaired balance: -occurs when she gets here feet too close -feels drunk in the morning   Pain Inventory Average Pain 6 Pain Right Now 5 My pain is constant, burning, tingling, and aching  LOCATION OF PAIN  right shoulder, right arm, right knee & leg, right foot pain, back pain Number of stools per week: 2-3 per week  BLADDER Normal   Mobility walk with assistance how many minutes can you walk? 6 ability to climb steps?  yes do you drive?  yes  Function disabled: date disabled 03/2023 retired I need assistance with the following:  dressing, meal prep, household duties, and shopping  Neuro/Psych weakness numbness tingling trouble walking dizziness anxiety  Prior Studies Any changes since last visit?  no  Physicians involved in your care Any changes since last visit?  no    Family History  Problem Relation Age of Onset   Heart disease Mother 60       stents   Stroke Mother    Hyperlipidemia Mother    Hypertension Mother    Other Mother        blood disorder- mgus   Colon polyps Father     Other Father        aorta disection   Aneurysm Father    Hypertension Sister        ?   AAA (abdominal aortic aneurysm) Sister    Hyperlipidemia Sister    Arthritis Sister    Other Sister        thyroid    Other Sister        thyroid    Stroke Maternal Aunt    Parkinson's disease Maternal Uncle    Colon cancer Paternal Uncle    Irritable bowel syndrome Daughter    Other Daughter        gastritis   Mental illness Daughter        bipolar and mood disorder   Mental illness Son        bipolar   Social History   Socioeconomic History   Marital status: Married    Spouse name: Ozell   Number of children: 2   Years of education: Not on file   Highest education level: Not on file  Occupational History   Occupation: Disabled  Tobacco Use   Smoking status: Former    Current packs/day: 0.00    Average packs/day: 1 pack/day for 30.0 years (30.0 ttl pk-yrs)    Types: Cigarettes    Start date: 05/16/1979    Quit date: 05/15/2009    Years since quitting: 14.3  Smokeless tobacco: Never  Vaping Use   Vaping status: Never Used  Substance and Sexual Activity   Alcohol  use: No   Drug use: No   Sexual activity: Never  Other Topics Concern   Not on file  Social History Narrative   Pt lives with husband    Retired    Social Drivers of Corporate investment banker Strain: Low Risk  (02/26/2023)   Received from [facility]   Overall Financial Resource Strain (CARDIA)    Difficulty of Paying Living Expenses: Not very hard  Food Insecurity: No Food Insecurity (09/20/2023)   Received from [facility]   Hunger Vital Sign    Within the past 12 months, you worried that your food would run out before you got the money to buy more.: Never true    Within the past 12 months, the food you bought just didn't last and you didn't have money to get more.: Never true  Transportation Needs: No Transportation Needs (09/20/2023)   Received from [facility] - Transportation    In the  past 12 months, has lack of transportation kept you from medical appointments or from getting medications?: No    In the past 12 months, has lack of transportation kept you from meetings, work, or from getting things needed for daily living?: No  Physical Activity: Insufficiently Active (06/02/2023)   Received from [facility]   Exercise Vital Sign    On average, how many days per week do you engage in moderate to strenuous exercise (like a brisk walk)?: 1 day    On average, how many minutes do you engage in exercise at this level?: 10 min  Stress: No Stress Concern Present (09/20/2023)   Received from [facility] - Occupational Stress Questionnaire    Do you feel stress - tense, restless, nervous, or anxious, or unable to sleep at night because your mind is troubled all the time - these days?: Only a little  Social Connections: Socially Integrated (06/02/2023)   Received from [facility]   Social Network    How would you rate your social network (family, work, friends)?: Good participation with social networks   Past Surgical History:  Procedure Laterality Date   ABDOMINAL HYSTERECTOMY     ABDOMINAL HYSTERECTOMY  01/15/2009   complete   COLONOSCOPY     DILATION AND CURETTAGE OF UTERUS  01/16/1983   EYE SURGERY  03/03/2014   Surgery on both eyes for epiretinal membrane (vitreous peel)   Gated Spect wall motion stress cardiolite  11/05/2001   HIATAL HERNIA REPAIR N/A 07/12/2021   Procedure: LAPAROSCOPY W/ EXTENSIVE FOREGUT DISSECTION; PARTIAL STOMACH REDUCTION; GASTROSTOMY TUBE PLACEMENT; GASTROPEXY;  Surgeon: Gladis Cough, MD;  Location: [facility];  Service: General;  Laterality: N/A;   LAPAROSCOPIC RIGHT HEMI COLECTOMY Right 10/16/2022   Procedure: LAPAROSCOPIC ASSISTED RIGHT HEMI COLECTOMY;  Surgeon: Teresa Lonni HERO, MD;  Location: [facility] OR;  Service: General;  Laterality: Right;   POLYPECTOMY     TUBAL LIGATION  01/15/1993   UTERINE  SUSPENSION     mesh   VITRECTOMY Bilateral 03/03/2014   XI ROBOTIC ASSISTED HIATAL HERNIA REPAIR N/A 05/01/2021   Procedure: XI ROBOTIC ASSISTED TYPE III HIATAL HERNIA REPAIR WITH FUNDOPLICATION;  Surgeon: Gladis Cough, MD;  Location: [facility];  Service: General;  Laterality: N/A;   Past Medical History:  Diagnosis Date   Allergic rhinitis    Anemia 10/10/2011   Anxiety and  depression 01/17/2007   Qualifier: Diagnosis of  By: Kassie MD, Sean A    Arthritis 07/24/2013   Likely inflammatory and following with Dr Sissy of [facility]  rheumatology   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   BCC (basal cell carcinoma of skin) 06/01/2012   Leg Follows with Dr Shona   Bipolar disorder [facility]) 01/17/2007   Qualifier: Diagnosis of  By: Kassie MD, Sean A    Cataract    Diverticulosis    Diverticulosis    Emphysema of lung (HCC)    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   Gallstones    GERD (gastroesophageal reflux disease)    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   Hashimoto's disease    Hyperlipidemia    Hypertension    Hypothyroidism 08/24/2006   Qualifier: Diagnosis of  By: Kassie MD, Sean A     IBS (irritable bowel syndrome) 07/27/2016   Obesity 11/01/2013   PUD (peptic ulcer disease)    Sleep apnea 04/27/2016   Tobacco abuse    Ht 5' 6 (1.676 m)   BMI 24.53 kg/m   Opioid Risk Score:   Fall Risk Score:  `1  Depression screen [facility] 2/9     06/04/2023    9:59 AM 03/08/2023   10:09 AM 12/07/2022   11:58 AM 02/02/2017    2:39 PM 12/23/2015    2:13 PM 08/22/2015   12:37 PM 10/07/2014    1:55 PM  Depression screen PHQ 2/9  Decreased Interest 1 1 1  0 0 0 0  Down, Depressed, Hopeless 1 1 0 0 0 0 0  PHQ - 2 Score 2 2 1  0 0 0 0  Altered sleeping   1      Tired, decreased energy   2      Change in appetite   0      Feeling bad or failure about yourself    2      Trouble concentrating   0      Moving slowly or fidgety/restless   1      Suicidal thoughts   0      PHQ-9 Score   7      Difficult  doing work/chores   Extremely dIfficult         Review of Systems  Musculoskeletal:        Pain is on the total right side of the body from shoulder down to knee.  Neurological:  Positive for dizziness, weakness and numbness.       Off balance  Psychiatric/Behavioral:  The patient is nervous/anxious.   All other systems reviewed and are negative.      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  -Discussed benefits of exercise in reducing pain. -Discussed following foods that may reduce pain: 1) Ginger (especially studied for arthritis)- reduce leukotriene production to decrease inflammation 2) Blueberries- high in phytonutrients that decrease inflammation 3) Salmon- marine omega-3s reduce joint swelling and pain 4) Pumpkin seeds- reduce inflammation 5) dark chocolate- reduces inflammation 6) turmeric- reduces inflammation 7) tart cherries - reduce pain and  stiffness 8) extra virgin olive oil - its compound olecanthal helps to block prostaglandins  9) chili peppers- can be eaten or applied topically via capsaicin 10) mint- helpful for headache, muscle aches, joint pain, and itching 11) garlic- reduces inflammation  Link to further information on diet for chronic pain: [email or web address]   6) Fatigue: -discussed likely stroke related -discussed that this has improved -NAC and liposomal glutathione recommended  7) shortness of breath: -discussed that she follows with cardiology -discussed that cardiac monitoring was normal -discussed she had loop recorder placed  8) ADHD: -continue Adderall, discussed that this dose could be increased by her PCP  9) Impaired balance: -discussed working in the pool at her gym  10) Right sided neuropathic pain: -Recommend topical CBD oil- discussed its benefits in reducing inflammation, pain, insomnia, and anxiety.  -Discussed that CBD oil differs from marijuana in that it does not contain THC- the substance that causes euphoria.  -Discussed that it is made from the hemp plant.  -It has been used for thousands of years -preliminary research suggests that if may be able to shrink cancerous tumors, stop plaque formation in Alzheimer's Disease, and slow the progress of brain disease from concussions.  -Additional benefits that have been demonstrated in studies include improved nausea, indigestion, and brain health, and reduced seizures.  -In a survey 92% of patients who tried medical cannabis felt it improved symptoms such as chronic pain, arthritis, migraines, and cancer.   -discussed that topamax , gabapentin, lyrica cause confusion, amitriptyline will likely cause the same symptoms  -discussed the is taking hydrocodone , discussed that an increased dose can be helpful  Lidocaine  5% prescribed  11) Decreased appetite:  -discussed  that she feels this is because she gets so busy  12) Drooling: -discussed that this can occur following facial droop  13) Psoriasis: -discussed that she was recommended to  -advised avoiding gluten  14) Impaired sensation on right side: -discussed that this may not come back since it has been so long

## 2023-10-10 ENCOUNTER — Ambulatory Visit: Admitting: Internal Medicine

## 2023-10-10 ENCOUNTER — Encounter: Payer: Self-pay | Admitting: Internal Medicine

## 2023-10-10 VITALS — BP 110/70 | HR 66 | Ht 66.0 in | Wt 153.4 lb

## 2023-10-10 DIAGNOSIS — K219 Gastro-esophageal reflux disease without esophagitis: Secondary | ICD-10-CM | POA: Diagnosis not present

## 2023-10-10 DIAGNOSIS — Z8719 Personal history of other diseases of the digestive system: Secondary | ICD-10-CM | POA: Diagnosis not present

## 2023-10-10 DIAGNOSIS — Z860101 Personal history of adenomatous and serrated colon polyps: Secondary | ICD-10-CM

## 2023-10-10 DIAGNOSIS — R1012 Left upper quadrant pain: Secondary | ICD-10-CM

## 2023-10-10 DIAGNOSIS — K625 Hemorrhage of anus and rectum: Secondary | ICD-10-CM

## 2023-10-10 NOTE — Patient Instructions (Signed)
 Please follow up as needed.  _______________________________________________________  If your blood pressure at your visit was 140/90 or greater, please contact your primary care physician to follow up on this.  _______________________________________________________  If you are age 63 or older, your body mass index should be between 23-30. Your Body mass index is 24.76 kg/m. If this is out of the aforementioned range listed, please consider follow up with your Primary Care Provider.  If you are age 33 or younger, your body mass index should be between 19-25. Your Body mass index is 24.76 kg/m. If this is out of the aformentioned range listed, please consider follow up with your Primary Care Provider.   ________________________________________________________  The  GI providers would like to encourage you to use MYCHART to communicate with providers for non-urgent requests or questions.  Due to long hold times on the telephone, sending your provider a message by [facility] may be a faster and more efficient way to get a response.  Please allow 48 business hours for a response.  Please remember that this is for non-urgent requests.  _______________________________________________________  [facility] is using a team-based approach to care.  Your team is made up of your doctor and two to three APPS. Our APPS (Nurse Practitioners and Physician Assistants) work with your physician to ensure care continuity for you. They are fully qualified to address your health concerns and develop a treatment plan. They communicate directly with your gastroenterologist to care for you. Seeing the Advanced Practice Practitioners on your physician's team can help you by facilitating care more promptly, often allowing for earlier appointments, access to diagnostic testing, procedures, and other specialty referrals.

## 2023-10-10 NOTE — Progress Notes (Signed)
 HISTORY OF PRESENT ILLNESS:  Joan Robinson is a 63 y.o. female with multiple significant medical problems who has been followed in this office for GERD, complicated hiatal hernia requiring surgery with redo, and colon polyps.  She presents today with her husband, Ozell, after an isolated episode of painless rectal bleeding.   I saw the patient in the office on September 04, 2018.  See that dictation for details.  After that visit, she had multiple significant medical problems.  She was hospitalized October 2024 with severe abdominal pain.  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  Prior to surgery, she developed a stroke.  Subsequently underwent right hemicolectomy.  Now on Plavix .  Significant residual deficits with slow recovery.  These include left ear deafness, left eye blindness, left facial droop, imbalance, and weakness.  Recent loop recorder placement.  Seeing ophthalmology.  She was last seen in this office July 09, 2023 for rectal bleeding.  See that dictation.  Her last complete colonoscopy February 17, 2021.  Sigmoid diverticulosis.  Internal hemorrhoids.  Otherwise normal.  Follow-up in 5 years recommended due to history of multiple and advanced adenomatous polyps.  At the time of her visit, she was felt to have an isolated episode of rectal bleeding likely secondary to internal hemorrhoids.  Plans were for observation.  She presents now for follow-up.  She is accompanied by her husband Ozell.  She has had no further bleeding.  She continues to get stronger.  She successfully underwent cochlear implant.   She does report occasional left upper quadrant catching like pain associated with bending and relieved with standing.  No other associated features.  The patient's GI review of system is negative otherwise.  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   BCC (basal cell carcinoma of skin) 06/01/2012   Leg Follows with Dr Shona   Bipolar disorder (HCC) 01/17/2007   Qualifier: Diagnosis of  By: Kassie MD, Sean A    Cataract    Diverticulosis    Diverticulosis    Emphysema of lung (HCC)    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   Gallstones    GERD (gastroesophageal reflux disease)    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   Hashimoto's disease    Hyperlipidemia    Hypertension    Hypothyroidism 08/24/2006   Qualifier: Diagnosis of  By: Kassie MD, Sean A     IBS (irritable bowel syndrome) 07/27/2016   Obesity 11/01/2013   PUD (peptic ulcer disease)    Sleep apnea 04/27/2016   Tobacco abuse     Past Surgical History:  Procedure Laterality Date   ABDOMINAL HYSTERECTOMY     ABDOMINAL HYSTERECTOMY  01/15/2009   complete   COLONOSCOPY     DILATION AND CURETTAGE OF UTERUS  01/16/1983   EYE SURGERY  03/03/2014   Surgery on both eyes for epiretinal membrane (vitreous peel)   Gated Spect wall motion stress cardiolite  11/05/2001   HIATAL HERNIA REPAIR N/A 07/12/2021   Procedure: LAPAROSCOPY W/ EXTENSIVE FOREGUT DISSECTION; PARTIAL STOMACH REDUCTION; GASTROSTOMY TUBE PLACEMENT; GASTROPEXY;  Surgeon: Gladis Cough, MD;  Location: [facility];  Service: General;  Laterality: N/A;   LAPAROSCOPIC RIGHT HEMI COLECTOMY Right 10/16/2022   Procedure: LAPAROSCOPIC ASSISTED RIGHT HEMI COLECTOMY;  Surgeon: Teresa Lonni HERO, MD;  Location: [facility] OR;  Service: General;  Laterality: Right;   POLYPECTOMY     TUBAL LIGATION  01/15/1993   UTERINE SUSPENSION     mesh   VITRECTOMY Bilateral 03/03/2014   XI ROBOTIC ASSISTED HIATAL HERNIA REPAIR N/A 05/01/2021   Procedure: XI ROBOTIC ASSISTED TYPE  III HIATAL HERNIA REPAIR WITH FUNDOPLICATION;  Surgeon: Gladis Cough, MD;  Location: [facility];  Service: General;  Laterality: N/A;    Social History [place]  reports that she quit smoking about 14 years ago. Her smoking use included cigarettes. She started smoking about 44 years ago. She has a 30 pack-year smoking history. She has never used smokeless tobacco. She reports that she does not drink alcohol  and does not use drugs.  family history includes AAA (abdominal aortic aneurysm) in her sister; Aneurysm in her father; Arthritis in her sister; Colon cancer in her paternal uncle; Colon polyps in her father; Heart disease (age of onset: 29) in her mother; Hyperlipidemia in her mother and sister; Hypertension in her mother and sister; Irritable bowel syndrome in her daughter; Mental illness in her daughter and son; Other in her daughter, father, mother, sister, and sister; Parkinson's disease in her maternal uncle; Stroke in her maternal aunt and mother.  Allergies  Allergen Reactions   Aspirin Shortness Of Breath   Nsaids Shortness Of Breath   Orphenadrine Palpitations   Cephalexin Diarrhea and Other (See Comments)   Dicyclomine Hcl Other (See Comments)    REACTION: mouth ulcers   Duloxetine  Hcl Rash   Lifitegrast Other (See Comments)    Burned eyes   Quetiapine  Other (See Comments)    Somnolence. Slept for 36 hours straight.   Butamben-Tetracaine-Benzocaine      Mouth sores   Dicyclomine Hcl     REACTION: mouth ulcers   Metronidazole Hives    mouth ulcers   Phenergan [Promethazine Hcl] Other (See Comments)    knocks pt out for about 3 days   Promethazine     Other Reaction(s): Fatigue   Moxifloxacin Nausea Only and Palpitations    REACTION: increased heart rate   Moxifloxacin Hcl In Nacl Palpitations   Orphenadrine Citrate Palpitations   Sulfamethoxazole -Trimethoprim  Nausea Only and Palpitations    REACTION: increased heart rate, nausea       PHYSICAL  EXAMINATION: Vital signs: BP 110/70 (BP Location: Left Arm, Patient Position: Sitting, Cuff Size: Normal)   Pulse 66   Ht 5' 6 (1.676 m)   Wt 153 lb 6 oz (69.6 kg)   BMI 24.76 kg/m  General: Well-developed, well-nourished, no acute distress HEENT: Left eye patch.  Left facial droop Abdomen: soft, nontender, nondistended, no obvious ascites, no peritoneal signs, normal bowel sounds. No organomegaly. Extremities: No edema Psychiatric: alert and oriented x3. Cooperative   ASSESSMENT:   1.  Isolated episode of minor rectal bleeding.  No recurrence.  Likely internal hemorrhoids (noted on colonoscopy images). 2.  History of adenomatous colon polyps.  Last colonoscopy February 2023.  No neoplasia. 3.  GERD 4.  Hiatal hernia. 5.  Cecal volvulus status post right hemicolectomy 6.  Multiple significant medical problems.  Chronic Plavix .     PLAN:   1.  Ongoing observation 2.  No plans for colonoscopy 3.  Routine GI office follow-up as needed 4.  Reflux precautions 5.  Continue  PPI 6.  Ongoing general medical care with PCP and multiple specialists Total time of 25 minutes was spent preparing to see the patient, obtaining comprehensive history, performing medically appropriate physical exam, counseling and educating the patient and her husband regarding the above listed issues, arranging follow-up, and documenting clinical information in the health record

## 2023-10-24 NOTE — Progress Notes (Signed)
 Remote Loop Recorder Transmission

## 2023-10-30 ENCOUNTER — Other Ambulatory Visit: Payer: Self-pay

## 2023-10-30 ENCOUNTER — Other Ambulatory Visit: Payer: Self-pay | Admitting: Neurology

## 2023-11-04 ENCOUNTER — Other Ambulatory Visit: Payer: Self-pay | Admitting: Cardiology

## 2023-11-04 ENCOUNTER — Encounter: Payer: Self-pay | Admitting: Cardiology

## 2023-11-05 MED ORDER — CLOPIDOGREL BISULFATE 75 MG PO TABS: 75.0000 mg | ORAL_TABLET | Freq: Every day | ORAL | 3 refills | Status: AC

## 2023-11-05 MED ORDER — ROSUVASTATIN CALCIUM 20 MG PO TABS: 20.0000 mg | ORAL_TABLET | Freq: Every day | ORAL | 3 refills | Status: AC

## 2023-11-10 ENCOUNTER — Ambulatory Visit

## 2023-12-06 ENCOUNTER — Encounter

## 2024-01-26 ENCOUNTER — Ambulatory Visit

## 2024-01-26 ENCOUNTER — Ambulatory Visit: Payer: Self-pay

## 2024-01-26 DIAGNOSIS — I6300 Cerebral infarction due to thrombosis of unspecified precerebral artery: Secondary | ICD-10-CM

## 2024-01-27 LAB — CUP PACEART REMOTE DEVICE CHECK
Date Time Interrogation Session: 20260110234117
Implantable Pulse Generator Implant Date: 20250612

## 2024-01-28 NOTE — Progress Notes (Signed)
 Remote Loop Recorder Transmission

## 2024-01-31 ENCOUNTER — Ambulatory Visit: Payer: Self-pay | Admitting: Cardiovascular Disease

## 2024-02-03 ENCOUNTER — Encounter: Payer: Self-pay | Admitting: Physical Medicine and Rehabilitation

## 2024-02-03 ENCOUNTER — Encounter: Attending: Physical Medicine and Rehabilitation | Admitting: Physical Medicine and Rehabilitation

## 2024-02-03 VITALS — BP 124/76 | HR 78 | Ht 66.0 in | Wt 155.6 lb

## 2024-02-03 DIAGNOSIS — M545 Low back pain, unspecified: Secondary | ICD-10-CM | POA: Diagnosis not present

## 2024-02-03 DIAGNOSIS — I63532 Cerebral infarction due to unspecified occlusion or stenosis of left posterior cerebral artery: Secondary | ICD-10-CM | POA: Insufficient documentation

## 2024-02-03 DIAGNOSIS — R451 Restlessness and agitation: Secondary | ICD-10-CM | POA: Diagnosis not present

## 2024-02-03 DIAGNOSIS — I69398 Other sequelae of cerebral infarction: Secondary | ICD-10-CM | POA: Insufficient documentation

## 2024-02-03 DIAGNOSIS — F909 Attention-deficit hyperactivity disorder, unspecified type: Secondary | ICD-10-CM | POA: Insufficient documentation

## 2024-02-03 DIAGNOSIS — R2689 Other abnormalities of gait and mobility: Secondary | ICD-10-CM | POA: Insufficient documentation

## 2024-02-03 DIAGNOSIS — R112 Nausea with vomiting, unspecified: Secondary | ICD-10-CM | POA: Diagnosis not present

## 2024-02-03 NOTE — Progress Notes (Signed)
 "  Subjective:    Patient ID: Joan Robinson, female    DOB: 1960-03-16, 64 y.o.   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 Rosemarie from neurology released her -she and her husband joined a gym and they have aqutherapy and chair yoga -no falls -nervous about walking with others because she is nervous about falling -balance is still off -received disability -cold sensation inside her head on the left side- she feels like someone is dropping ice water  on her head  2) Fatigue: -she asks of this is due to the stroke  3) Shortness of breath: -discussed that Dr. Bucky note says that no afib was found on her cardiac monitor -had loop recorder placed -she was told she has tachycardia -she has followed with cardiology -she had EKGs done  4) Left sided deafness: -discussed that they are planning for a hearing aide  5) Impaired balance: -occurs when she gets here feet too close -feels drunk in the morning  6) Shortness of breath:   Pain Inventory Average Pain 7 Pain Right Now 6 My pain is constant, sharp, burning, dull, stabbing, tingling, and aching  In the last 24 hours, has pain interfered with the following? General activity 3 Relation with others 0 Enjoyment of life 3 What TIME of day is your pain at its worst? evening and night Sleep (in general) Fair  Pain is worse with: inactivity, standing, and some activites Pain improves with: pacing activities and medication Relief from Meds: 7       Family History  Problem Relation Age of Onset   Heart disease Mother 80       stents   Stroke Mother    Hyperlipidemia Mother    Hypertension Mother    Other Mother        blood disorder- mgus   Colon polyps Father    Other Father         aorta disection   Aneurysm Father    Hypertension Sister        ?   AAA (abdominal aortic aneurysm) Sister    Hyperlipidemia Sister    Arthritis Sister    Other Sister        thyroid    Other Sister        thyroid    Stroke Maternal Aunt    Parkinson's disease Maternal Uncle    Colon cancer Paternal Uncle    Irritable bowel syndrome Daughter    Other Daughter        gastritis   Mental illness Daughter        bipolar and mood disorder   Mental illness Son        bipolar   Social History   Socioeconomic History   Marital status: Married    Spouse name: Ozell   Number of children: 2   Years of education: Not on file   Highest education level: Not on file  Occupational History   Occupation: Disabled  Tobacco Use   Smoking status: Former    Current packs/day: 0.00    Average packs/day: 1 pack/day for 30.0 years (30.0 ttl pk-yrs)    Types: Cigarettes    Start date: 05/16/1979    Quit date: 05/15/2009    Years since  quitting: 14.7   Smokeless tobacco: Never  Vaping Use   Vaping status: Never Used  Substance and Sexual Activity   Alcohol  use: No   Drug use: No   Sexual activity: Never  Other Topics Concern   Not on file  Social History Narrative   Pt lives with husband    Retired    Social Drivers of Health   Tobacco Use: Medium Risk (01/03/2024)   Received from [facility]   Patient History    Smoking Tobacco Use: Former    Smokeless Tobacco Use: Never    Passive Exposure: Past  Physicist, Medical Strain: Low Risk (02/26/2023)   Received from [facility]   Overall Financial Resource Strain (CARDIA)    Difficulty of Paying Living Expenses: Not very hard  Food Insecurity: No Food Insecurity (09/20/2023)   Received from [facility]   Epic    Within the past 12 months, you worried that your food would run out before you got the money to buy more.: Never true    Within the past 12 months, the food you bought just didn't last and you didn't have money to  get more.: Never true  Transportation Needs: No Transportation Needs (09/20/2023)   Received [facility]    In the past 12 months, has lack of transportation kept you from medical appointments or from getting medications?: No    In the past 12 months, has lack of transportation kept you from meetings, work, or from getting things needed for daily living?: No  Physical Activity: Insufficiently Active (06/02/2023)   Received from [facility]   Exercise Vital Sign    On average, how many days per week do you engage in moderate to strenuous exercise (like a brisk walk)?: 1 day    On average, how many minutes do you engage in exercise at this level?: 10 min  Stress: No Stress Concern Present (09/20/2023)   Received from [facility] - Occupational Stress Questionnaire    Do you feel stress - tense, restless, nervous, or anxious, or unable to sleep at night because your mind is troubled all the time - these days?: Only a little  Social Connections: Socially Integrated (06/02/2023)   Received from [facility]   Social Network    How would you rate your social network (family, work, friends)?: Good participation with social networks  Depression (PHQ2-9): Low Risk (02/03/2024)   Depression (PHQ2-9)    PHQ-2 Score: 0  Alcohol  Screen: Not on file  Housing: Low Risk (09/20/2023)   Received from [facility]    In the last 12 months, was there a time when you were not able to pay the mortgage or rent on time?: No    In the past 12 months, how many times have you moved where you were living?: 0    At any time in the past 12 months, were you homeless or living in a shelter (including now)?: No  Utilities: Not At Risk (09/20/2023)   Received from [facility]    In the past 12 months has the electric, gas, oil, or water  company threatened to shut off services in your home?: No  Health Literacy: Not on file   Past Surgical History:   Procedure Laterality Date   ABDOMINAL HYSTERECTOMY     ABDOMINAL HYSTERECTOMY  01/15/2009   complete   COLONOSCOPY     DILATION AND CURETTAGE  OF UTERUS  01/16/1983   EYE SURGERY  03/03/2014   Surgery on both eyes for epiretinal membrane (vitreous peel)   Gated Spect wall motion stress cardiolite  11/05/2001   HIATAL HERNIA REPAIR N/A 07/12/2021   Procedure: LAPAROSCOPY W/ EXTENSIVE FOREGUT DISSECTION; PARTIAL STOMACH REDUCTION; GASTROSTOMY TUBE PLACEMENT; GASTROPEXY;  Surgeon: Gladis Cough, MD;  Location: [facility];  Service: General;  Laterality: N/A;   LAPAROSCOPIC RIGHT HEMI COLECTOMY Right 10/16/2022   Procedure: LAPAROSCOPIC ASSISTED RIGHT HEMI COLECTOMY;  Surgeon: Teresa Lonni HERO, MD;  Location: [facility] OR;  Service: General;  Laterality: Right;   POLYPECTOMY     TUBAL LIGATION  01/15/1993   UTERINE SUSPENSION     mesh   VITRECTOMY Bilateral 03/03/2014   XI ROBOTIC ASSISTED HIATAL HERNIA REPAIR N/A 05/01/2021   Procedure: XI ROBOTIC ASSISTED TYPE III HIATAL HERNIA REPAIR WITH FUNDOPLICATION;  Surgeon: Gladis Cough, MD;  Location: [facility];  Service: General;  Laterality: N/A;   Past Medical History:  Diagnosis Date   Allergic rhinitis    Anemia 10/10/2011   Anxiety and depression 01/17/2007   Qualifier: Diagnosis of  By: Kassie MD, Sean A    Arthritis 07/24/2013   Likely inflammatory and following with Dr Sissy of [facility]  rheumatology   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   BCC (basal cell carcinoma of skin) 06/01/2012   Leg Follows with Dr Shona   Bipolar disorder (HCC) 01/17/2007   Qualifier: Diagnosis of  By: Kassie MD, Alyce A    Cataract    Diverticulosis    Diverticulosis    Emphysema of lung (HCC)    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   Gallstones    GERD (gastroesophageal reflux disease)    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   Hashimoto's disease    Hyperlipidemia    Hypertension    Hypothyroidism 08/24/2006   Qualifier: Diagnosis of  By: Kassie MD, Sean A      IBS (irritable bowel syndrome) 07/27/2016   Obesity 11/01/2013   PUD (peptic ulcer disease)    Sleep apnea 04/27/2016   Tobacco abuse    BP (!) 146/91   Pulse 78   Ht 5' 6 (1.676 m)   Wt 155 lb 9.6 oz (70.6 kg)   SpO2 99%   BMI 25.11 kg/m   Opioid Risk Score:   Fall Risk Score:  `1  Depression screen [facility] 2/9     02/03/2024    9:54 AM 06/04/2023    9:59 AM 03/08/2023   10:09 AM 12/07/2022   11:58 AM 02/02/2017    2:39 PM 12/23/2015    2:13 PM 08/22/2015   12:37 PM  Depression screen PHQ 2/9  Decreased Interest 0 1 1 1  0 0 0  Down, Depressed, Hopeless 0 1 1 0 0 0 0  PHQ - 2 Score 0 2 2 1  0 0 0  Altered sleeping    1     Tired, decreased energy    2     Change in appetite    0     Feeling bad or failure about yourself     2     Trouble concentrating    0     Moving slowly or fidgety/restless    1     Suicidal thoughts    0     PHQ-9 Score    7      Difficult doing work/chores    Extremely dIfficult        Data saved with a previous flowsheet row  definition     Review of Systems  Musculoskeletal:  Positive for gait problem.       Pain is on the total right side of the body from shoulder down to knee.  Neurological:  Positive for dizziness, weakness and numbness.       Off balance  Psychiatric/Behavioral:  The patient is nervous/anxious.   All other systems reviewed and are negative.      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:  -continue Plavix  -discussed her diet -encouraged eating fruits and vegetables -discussed that her  daughter brings her meals -discussed that mashed potatoes will be soft enough for her -continue magnesium  and turmeric supplements -discussed that she is so proud that she completed the Heart Walk -continue walking stick -completed disability paperwork for her -discussed follow-up with stroke groups -handicap placard prescribed -filled out disability form -continue eye patch -continue botox to eye  2) Short-tempered/agitated: -continue lexapro  30mg  daily -discussed neuropsych follow-up -discussed that this still occurs at a time  3) Nausea: -continue zofran  prn  4) Visal deficits:  -lacrilube prescribed  5) Chronic Pain Syndrome secondary to low back pain -continue hydrocodone  -encouraged yoga -encouraged walking -discussed that her pain is worse on the right side  -Discussed Qutenza as an option for neuropathic pain control. Discussed that this is a capsaicin patch, stronger than capsaicin cream. Discussed that it is currently approved for diabetic peripheral neuropathy and post-herpetic neuralgia, but that it has also shown benefit in treating other forms of neuropathy. Provided patient with link to site to learn more about the patch: https://www.clark.biz/. Discussed that the patch would be placed in office and benefits usually last 3 months. Discussed that unintended exposure to capsaicin can cause severe irritation of eyes, mucous membranes, respiratory tract, and skin, but that Qutenza is a local treatment and does not have the systemic side effects of other nerve medications. Discussed that there may be pain, itching, erythema, and decreased sensory function associated with the application of Qutenza. Side effects usually subside within 1 week. A cold pack of analgesic medications can help with these side effects. Blood pressure can also be increased due to pain associated with administration of the patch.  We are recommending Qutenza 8% capsaicin to treat this patient's pain.  Qutenza is the first-line treatment option recommended for diabetic peripheral neuropathy by the AACE and ADA.   Qutenza is a safer option for this patient due to the following: Gabapentin use has been associated with increased risk of dementia Lyrica use has been associated with increased risk of heart failure Cymblata, Venlafaxine , and other SSRIs/SNRIs are associated with increased weight gain Lidocaine  5% has been prescribed/tried  -discussed that lidocaine  patch was not covered -Discussed current symptoms of pain and history of pain.  -Discussed benefits of exercise in reducing pain. -Discussed following foods that may reduce pain: 1) Ginger (especially studied for arthritis)- reduce leukotriene production to decrease inflammation 2) Blueberries- high in phytonutrients that decrease inflammation 3) Salmon- marine omega-3s reduce joint swelling and pain 4) Pumpkin seeds- reduce inflammation 5) dark chocolate- reduces  inflammation 6) turmeric- reduces inflammation 7) tart cherries - reduce pain and stiffness 8) extra virgin olive oil - its compound olecanthal helps to block prostaglandins  9) chili peppers- can be eaten or applied topically via capsaicin 10) mint- helpful for headache, muscle aches, joint pain, and itching 11) garlic- reduces inflammation  Link to further information on diet for chronic pain: http://www.bray.com/   6) Fatigue: -discussed likely stroke related -discussed that this has improved -NAC and liposomal glutathione recommended  7) shortness of breath: -discussed that she follows with cardiology -discussed that cardiac monitoring was normal -discussed she had loop recorder placed -offered referral to pulmonology but she defers  8) ADHD: -continue Adderall, discussed that this dose could be increased by her PCP  9) Impaired balance: -discussed working in the pool at her  gym -discussed that this is still off  10) Right sided neuropathic pain: -Recommend topical CBD oil- discussed its benefits in reducing inflammation, pain, insomnia, and anxiety.  -Discussed that CBD oil differs from marijuana in that it does not contain THC- the substance that causes euphoria.  -Discussed that it is made from the hemp plant.  -It has been used for thousands of years -preliminary research suggests that if may be able to shrink cancerous tumors, stop plaque formation in Alzheimer's Disease, and slow the progress of brain disease from concussions.  -Additional benefits that have been demonstrated in studies include improved nausea, indigestion, and brain health, and reduced seizures.  -In a survey 92% of patients who tried medical cannabis felt it improved symptoms such as chronic pain, arthritis, migraines, and cancer.   -discussed that topamax , gabapentin, lyrica cause confusion, amitriptyline will likely cause the same symptoms  -discussed the is taking hydrocodone , discussed that an increased dose can be helpful  -discussed that Lidocaine  patch was not covered  11) Decreased appetite:  -discussed that she feels this is because she gets so busy  12) Drooling: -discussed that this can occur following facial droop  13) Psoriasis: -discussed that she was recommended to  -advised avoiding gluten  14) Impaired sensation on right side: -discussed that this may not come back since it has been so long  15) Aortic atherosclerosis: -discussed that this means there are lipids around the aorta -discussed her concerns regarding aortic dissection given her father's history    "

## 2024-02-03 NOTE — Telephone Encounter (Signed)
 Pt asking if tachycardia from reading this day is something to be concerned about. Please advise.

## 2024-02-11 NOTE — Progress Notes (Unsigned)
 " Cardiology Office Note:   Date:  02/12/2024  ID:  Joan Robinson, DOB Mar 15, 1960, MRN [id number] PCP: Teresa Aldona CROME, NP  [facility] Providers Cardiologist:  Lynwood Schilling, MD Electrophysiologist:  Eulas FORBES Furbish, MD {  History of Present Illness:   Joan Robinson is a 64 y.o. female who I have seen in the past for evaluation of aortic valve calcification and aortic atherosclerosis.  She had a cardiac cath and stress test many years ago that she says were normal.  In 2024 she had a POET (Plain Old Exercise Treadmill) that was negative.  She had a stroke in fall 2024.  She was given thrombolytics. CT angio of the head and neck was without large vessel occlusion. MRI showed a large acute infarct involving both left PICA and Icar territory with mass effect in the posterior fossa and fourth ventricle echocardiography was unremarkable. There were no arrhythmias noted on the discharge summary as. She wore a monitor for 4 weeks afterwards and no A-fib has been identified. She is being treated with Plavix . Carotid Doppler was negative   she has had an ILR with today no evidence of atrial fibrillation.  Since I last saw her she has had some increasing shortness of breath with activity such as helping her neighbors with her trash cans.  She has noticed some increased shortness of breath walking around the house.   She is not describing chest pressure, neck or arm discomfort.  She has not been having any PND or orthopnea.  She is not been having any cough fevers or chills.  She said this is noticeably different than a few months ago.  ROS: As stated in the HPI and negative for all other systems.  Studies Reviewed:    EKG:        Risk Assessment/Calculations:              Physical Exam:   VS:  BP 129/79 (BP Location: Left Arm, Patient Position: Sitting, Cuff Size: Normal)   Pulse 62   Ht 5' 6 (1.676 m)   Wt 157 lb (71.2 kg)   SpO2 98%   BMI 25.34 kg/m    Wt Readings from Last 3  Encounters:  02/12/24 157 lb (71.2 kg)  02/03/24 155 lb 9.6 oz (70.6 kg)  10/10/23 153 lb 6 oz (69.6 kg)     GEN: Well nourished, well developed in no acute distress NECK: No JVD; No carotid bruits CARDIAC: RRR, no murmurs, rubs, gallops RESPIRATORY:  Clear to auscultation without rales, wheezing or rhonchi  ABDOMEN: Soft, non-tender, non-distended EXTREMITIES:  No edema; No deformity   ASSESSMENT AND PLAN:   Aortic valve calcium :       There was no aortic stenosis on echo Sept 2024.  No change in therapy.   Aortic atherosclerosis:   She will continue with risk reduction.   Coronary calcium :  I will assess as below.     Dyslipidemia: Her LDL was 40.  No change in therapy.    CVA: She will continue with Plavix .  She has had no evidence of atrial fibs on her ILR.  Shortness of breath: I cannot exclude an ischemic etiology.  She would need to walk on a treadmill.  She will have a PET cardiac to screen for obstructive coronary disease.    Abnormal EKG:    She has a short PR interval but no symptomatic palpitations.  She has some high rates on the ILR but these are not  sustained.  No change in therapy.   Follow up with me in one year based on the results of the above.   Signed, Lynwood Schilling, MD   "

## 2024-02-12 ENCOUNTER — Ambulatory Visit: Payer: Self-pay | Attending: Cardiology | Admitting: Cardiology

## 2024-02-12 ENCOUNTER — Encounter: Payer: Self-pay | Admitting: Cardiology

## 2024-02-12 VITALS — BP 129/79 | HR 62 | Ht 66.0 in | Wt 157.0 lb

## 2024-02-12 DIAGNOSIS — R931 Abnormal findings on diagnostic imaging of heart and coronary circulation: Secondary | ICD-10-CM

## 2024-02-12 DIAGNOSIS — E785 Hyperlipidemia, unspecified: Secondary | ICD-10-CM | POA: Diagnosis not present

## 2024-02-12 DIAGNOSIS — I700 Atherosclerosis of aorta: Secondary | ICD-10-CM

## 2024-02-12 DIAGNOSIS — Q239 Congenital malformation of aortic and mitral valves, unspecified: Secondary | ICD-10-CM | POA: Diagnosis not present

## 2024-02-12 DIAGNOSIS — R0602 Shortness of breath: Secondary | ICD-10-CM | POA: Diagnosis not present

## 2024-02-12 NOTE — Patient Instructions (Signed)
 " Testing/Procedures: PET/CT Stress Test      Please report to Radiology at the [facility] Main Entrance 30 minutes early for your test.  [address] [place], [place] [address]                         OR   Please report to Radiology at [facility], medical mall, 30 mins prior to your test.  [address]  [place], [place]  How to Prepare for Your Cardiac PET/CT Stress Test:  Nothing to eat or drink, except water , 3 hours prior to arrival time.  NO caffeine/decaffeinated products, or chocolate 12 hours prior to arrival. (Please note decaffeinated beverages (teas/coffees) still contain caffeine).  If you have caffeine within 12 hours prior, the test will need to be rescheduled.  Medication instructions: Do not take erectile dysfunction medications for 72 hours prior to test (sildenafil, tadalafil) Do not take nitrates (isosorbide mononitrate, Ranexa) the day before or day of test Do not take tamsulosin the day before or morning of test Hold theophylline containing medications for 12 hours. Hold Dipyridamole 48 hours prior to the test.  Diabetic Preparation: If able to eat breakfast prior to 3 hour fasting, you may take all medications, including your insulin . Do not worry if you miss your breakfast dose of insulin  - start at your next meal. If you do not eat prior to 3 hour fast-Hold all diabetes (oral and insulin ) medications. Patients who wear a continuous glucose monitor MUST remove the device prior to scanning.  You may take your remaining medications with water .  NO perfume, cologne or lotion on chest or abdomen area. FEMALES - Please avoid wearing dresses to this appointment.  Total time is 1 to 2 hours; you may want to bring reading material for the waiting time.  IF YOU THINK YOU MAY BE PREGNANT, OR ARE NURSING PLEASE INFORM THE TECHNOLOGIST.  In preparation for your appointment, medication and supplies will be  purchased.  Appointment availability is limited, so if you need to cancel or reschedule, please call the Radiology Department Scheduler at [phone] 24 hours in advance to avoid a cancellation fee of $100.00  What to Expect When you Arrive:  Once you arrive and check in for your appointment, you will be taken to a preparation room within the Radiology Department.  A technologist or Nurse will obtain your medical history, verify that you are correctly prepped for the exam, and explain the procedure.  Afterwards, an IV will be started in your arm and electrodes will be placed on your skin for EKG monitoring during the stress portion of the exam. Then you will be escorted to the PET/CT scanner.  There, staff will get you positioned on the scanner and obtain a blood pressure and EKG.  During the exam, you will continue to be connected to the EKG and blood pressure machines.  A small, safe amount of a radioactive tracer will be injected in your IV to obtain a series of pictures of your heart along with an injection of a stress agent.    After your Exam:  It is recommended that you eat a meal and drink a caffeinated beverage to counter act any effects of the stress agent.  Drink plenty of fluids for the remainder of the day and urinate frequently for the first couple of hours after the exam.  Your doctor will inform you of your test results within  7-10 business days.  For more information and frequently asked questions, please visit our website: [email or web address]  For questions about your test or how to prepare for your test, please call: Cardiac Imaging Nurse Navigators Office: [phone]  For billing questions, please call [phone].    Follow-Up: At [facility], you and your health needs are our priority.  As part of our continuing mission to provide you with exceptional heart care, our providers are all part of one team.  This team includes your primary  Cardiologist (physician) and Advanced Practice Providers or APPs (Physician Assistants and Nurse Practitioners) who all work together to provide you with the care you need, when you need it.  Your next appointment:   1 year(s)  Provider:   Lynwood Schilling, MD            "

## 2024-02-13 ENCOUNTER — Encounter: Payer: Self-pay | Admitting: Internal Medicine

## 2024-02-18 NOTE — Telephone Encounter (Signed)
 Call from pt requesting appointment with Dr. Abran. Please advise. Thank you

## 2024-02-26 ENCOUNTER — Ambulatory Visit (HOSPITAL_COMMUNITY)

## 2024-03-04 ENCOUNTER — Other Ambulatory Visit (HOSPITAL_COMMUNITY)

## 2024-03-20 ENCOUNTER — Ambulatory Visit

## 2024-04-15 ENCOUNTER — Encounter

## 2024-05-04 ENCOUNTER — Encounter: Admitting: Physical Medicine and Rehabilitation

## 2024-06-05 ENCOUNTER — Ambulatory Visit

## 2024-07-26 ENCOUNTER — Ambulatory Visit: Payer: Self-pay

## 2024-07-29 ENCOUNTER — Ambulatory Visit

## 2024-08-24 ENCOUNTER — Encounter

## 2024-10-14 ENCOUNTER — Ambulatory Visit

## 2024-12-07 ENCOUNTER — Ambulatory Visit

## 2025-01-02 ENCOUNTER — Encounter

## 2025-01-24 ENCOUNTER — Ambulatory Visit: Payer: Self-pay

## 2025-02-22 ENCOUNTER — Ambulatory Visit

## 2025-04-17 ENCOUNTER — Ambulatory Visit

## 2025-05-13 ENCOUNTER — Encounter

## 2025-07-03 ENCOUNTER — Ambulatory Visit

## 2025-07-25 ENCOUNTER — Ambulatory Visit: Payer: Self-pay

## 2025-08-26 ENCOUNTER — Ambulatory Visit

## 2025-09-21 ENCOUNTER — Encounter

## 2025-11-11 ENCOUNTER — Ambulatory Visit

## 2026-01-04 ENCOUNTER — Ambulatory Visit

## 2026-01-30 ENCOUNTER — Encounter

## 2026-03-22 ENCOUNTER — Ambulatory Visit
